# Patient Record
Sex: Male | Born: 1950 | Race: Black or African American | Hispanic: No | Marital: Married | Smoking: Former smoker
Health system: Southern US, Community
[De-identification: ages and names within clinical notes are randomized; demographics above are authoritative.]

## PROBLEM LIST (undated history)

## (undated) DIAGNOSIS — E119 Type 2 diabetes mellitus without complications: Secondary | ICD-10-CM

## (undated) DIAGNOSIS — Z9114 Patient's other noncompliance with medication regimen: Secondary | ICD-10-CM

## (undated) DIAGNOSIS — N2581 Secondary hyperparathyroidism of renal origin: Secondary | ICD-10-CM

## (undated) DIAGNOSIS — K219 Gastro-esophageal reflux disease without esophagitis: Secondary | ICD-10-CM

## (undated) DIAGNOSIS — N185 Chronic kidney disease, stage 5: Secondary | ICD-10-CM

## (undated) DIAGNOSIS — D649 Anemia, unspecified: Secondary | ICD-10-CM

## (undated) DIAGNOSIS — E785 Hyperlipidemia, unspecified: Secondary | ICD-10-CM

## (undated) DIAGNOSIS — E1122 Type 2 diabetes mellitus with diabetic chronic kidney disease: Secondary | ICD-10-CM

## (undated) DIAGNOSIS — N189 Chronic kidney disease, unspecified: Secondary | ICD-10-CM

## (undated) DIAGNOSIS — E1140 Type 2 diabetes mellitus with diabetic neuropathy, unspecified: Secondary | ICD-10-CM

## (undated) DIAGNOSIS — L97501 Non-pressure chronic ulcer of other part of unspecified foot limited to breakdown of skin: Secondary | ICD-10-CM

## (undated) DIAGNOSIS — E08621 Diabetes mellitus due to underlying condition with foot ulcer: Secondary | ICD-10-CM

## (undated) DIAGNOSIS — I10 Essential (primary) hypertension: Secondary | ICD-10-CM

## (undated) DIAGNOSIS — Z91148 Patient's other noncompliance with medication regimen for other reason: Secondary | ICD-10-CM

## (undated) DIAGNOSIS — M869 Osteomyelitis, unspecified: Secondary | ICD-10-CM

## (undated) HISTORY — PX: COLON SURGERY: SHX602

## (undated) HISTORY — PX: HERNIA REPAIR: SHX51

## (undated) HISTORY — PX: APPENDECTOMY: SHX54

## (undated) HISTORY — PX: CHOLECYSTECTOMY: SHX55

---

## 2010-09-03 ENCOUNTER — Emergency Department: Payer: Self-pay | Admitting: Emergency Medicine

## 2013-04-17 DIAGNOSIS — M869 Osteomyelitis, unspecified: Secondary | ICD-10-CM | POA: Insufficient documentation

## 2013-04-17 DIAGNOSIS — N184 Chronic kidney disease, stage 4 (severe): Secondary | ICD-10-CM | POA: Insufficient documentation

## 2013-04-17 DIAGNOSIS — E119 Type 2 diabetes mellitus without complications: Secondary | ICD-10-CM | POA: Insufficient documentation

## 2013-04-17 DIAGNOSIS — I10 Essential (primary) hypertension: Secondary | ICD-10-CM | POA: Insufficient documentation

## 2013-04-17 DIAGNOSIS — N189 Chronic kidney disease, unspecified: Secondary | ICD-10-CM | POA: Insufficient documentation

## 2014-10-02 DIAGNOSIS — I639 Cerebral infarction, unspecified: Secondary | ICD-10-CM

## 2014-10-02 HISTORY — DX: Cerebral infarction, unspecified: I63.9

## 2015-08-09 DIAGNOSIS — N183 Chronic kidney disease, stage 3 unspecified: Secondary | ICD-10-CM | POA: Insufficient documentation

## 2015-08-09 DIAGNOSIS — M6281 Muscle weakness (generalized): Secondary | ICD-10-CM | POA: Insufficient documentation

## 2015-08-09 DIAGNOSIS — E119 Type 2 diabetes mellitus without complications: Secondary | ICD-10-CM | POA: Insufficient documentation

## 2015-08-09 DIAGNOSIS — E781 Pure hyperglyceridemia: Secondary | ICD-10-CM | POA: Insufficient documentation

## 2015-08-09 DIAGNOSIS — I639 Cerebral infarction, unspecified: Secondary | ICD-10-CM | POA: Insufficient documentation

## 2015-08-09 DIAGNOSIS — Z720 Tobacco use: Secondary | ICD-10-CM | POA: Insufficient documentation

## 2015-08-09 DIAGNOSIS — I10 Essential (primary) hypertension: Secondary | ICD-10-CM | POA: Insufficient documentation

## 2015-08-09 DIAGNOSIS — N179 Acute kidney failure, unspecified: Secondary | ICD-10-CM | POA: Insufficient documentation

## 2015-08-18 DIAGNOSIS — E1142 Type 2 diabetes mellitus with diabetic polyneuropathy: Secondary | ICD-10-CM | POA: Insufficient documentation

## 2015-08-18 DIAGNOSIS — E782 Mixed hyperlipidemia: Secondary | ICD-10-CM | POA: Insufficient documentation

## 2015-08-18 DIAGNOSIS — Z89422 Acquired absence of other left toe(s): Secondary | ICD-10-CM | POA: Insufficient documentation

## 2016-07-18 ENCOUNTER — Ambulatory Visit (INDEPENDENT_AMBULATORY_CARE_PROVIDER_SITE_OTHER): Payer: BLUE CROSS/BLUE SHIELD | Admitting: Sports Medicine

## 2016-07-18 ENCOUNTER — Encounter: Payer: Self-pay | Admitting: Sports Medicine

## 2016-07-18 ENCOUNTER — Ambulatory Visit (INDEPENDENT_AMBULATORY_CARE_PROVIDER_SITE_OTHER): Payer: BLUE CROSS/BLUE SHIELD

## 2016-07-18 DIAGNOSIS — E1142 Type 2 diabetes mellitus with diabetic polyneuropathy: Secondary | ICD-10-CM

## 2016-07-18 DIAGNOSIS — R0989 Other specified symptoms and signs involving the circulatory and respiratory systems: Secondary | ICD-10-CM | POA: Diagnosis not present

## 2016-07-18 DIAGNOSIS — L97522 Non-pressure chronic ulcer of other part of left foot with fat layer exposed: Secondary | ICD-10-CM | POA: Diagnosis not present

## 2016-07-18 DIAGNOSIS — B351 Tinea unguium: Secondary | ICD-10-CM | POA: Diagnosis not present

## 2016-07-18 DIAGNOSIS — Z89422 Acquired absence of other left toe(s): Secondary | ICD-10-CM

## 2016-07-18 DIAGNOSIS — M79676 Pain in unspecified toe(s): Secondary | ICD-10-CM

## 2016-07-18 DIAGNOSIS — S98132A Complete traumatic amputation of one left lesser toe, initial encounter: Secondary | ICD-10-CM

## 2016-07-18 DIAGNOSIS — M79672 Pain in left foot: Secondary | ICD-10-CM

## 2016-07-18 MED ORDER — TRAMADOL HCL 50 MG PO TABS: 50.0000 mg | ORAL_TABLET | Freq: Four times a day (QID) | ORAL | Status: DC | PRN

## 2016-07-18 NOTE — Progress Notes (Signed)
Subjective: [name] is a 65 y.o. male patient seen in office for evaluation of ulceration of the Left foot. Patient has a history of diabetes and a blood glucose level  today of 164m/dl.   Patient is changing the dressing using neopsorin at home. Denies nausea/fever/vomiting/chills/night sweats/shortness of breath/pain. [place] clear fluid where he was started on antibiotics; Admits a 3 month history of this ulceration. Patient has no other pedal complaints at this time.  Patient Active Problem List   Diagnosis Date Noted  . History of amputation of lesser toe of left foot ([place] 08/18/2015  . Hyperlipidemia, mixed 08/18/2015  . Type 2 diabetes mellitus with peripheral neuropathy (HPle[place] 08/18/2015  . Acute CVA (cerebrovascular accident) ([place] 08/09/2015  . Acute renal failure superimposed on stage 3 chronic kidney disease (HS[place] 08/09/2015  . Essential hypertriglyceridemia 08/09/2015  . Hypertension, essential 08/09/2015  . Right-sided muscle weakness 08/09/2015  . T2DM (type 2 diabetes mellitus) ([place] 08/09/2015  . Tobacco abuse 08/09/2015  . CKD (chronic kidney disease) 04/17/2013  . DM2 (diabetes mellitus, type 2) (HM[place]na 04/17/2013  . HTN (hypertension) 04/17/2013  . Osteomyelitis of toe of left foot (HC[place] 04/17/2013   No current outpatient prescriptions on file prior to visit.   No current facility-administered medications on file prior to visit.    No Known Allergies  No results found for this or any previous visit (from the past 2160 hour(s)).  Objective: There were no vitals filed for this visit.  General: Patient is awake, alert, oriented x 3 and in no acute distress.  Dermatology: Skin is warm and dry bilateral with a full thickness ulceration present Left sub met 1 to capsule Ulceration measures 3cm x 3 cm x 0.6 cm and tunnels 7 o'clock posiiton 1 cm. There is a keratotic border with a bleeding base.  The ulceration does probe to close to bone/capsule. There is mild malodor, no active drainage, no erythema, no edema. No other acute signs of infection. Nails 1-5 on right and 3-5 on left mycotic and elongated.   Vascular: Dorsalis Pedis pulse = 1/4 Bilateral,  Posterior Tibial pulse = 0/4 Bilateral,  Capillary Fill Time < 5 seconds  Neurologic: Vibratory and Protective sensation absent bilateral L>R.  Musculosketal: Mild pain with palpation to ulcerated area. No pain with compression to calves bilateral. Amputated left 1st and 2nd toe.   Xrays, Left foot:Amputated 1st and 2nd toe. No bony destruction suggestive of osteomyelitis. No gas in soft tissues.   Assessment and Plan:  Problem List Items Addressed This Visit    None    Visit Diagnoses    Left foot pain    -  Primary   Relevant Medications   traMADol (ULTRAM) 50 MG tablet   Other Relevant Orders   DG Foot 2 Views Left   WOUND CULTURE   Ulcer of left foot, with fat layer exposed (HT[place]       Relevant Orders   WOUND CULTURE   Amputated toe of left foot (HCC)       1st and 2nd toes   Diabetic polyneuropathy associated with type 2 diabetes mellitus (HCC)       Relevant Medications   aspirin EC 81 MG tablet   aspirin 81 MG chewable tablet   atorvastatin (LIPITOR) 80 MG tablet   glipiZIDE (GLUCOTROL) 5 MG tablet   Insulin Glargine (LANTUS SOLOSTAR) 100 UNIT/ML Solostar Pen   losartan (COZAAR) 50 MG tablet   pregabalin (  LYRICA) 100 MG capsule   metFORMIN (GLUCOPHAGE) 500 MG tablet   liraglutide (VICTOZA) 18 MG/3ML SOPN   Decreased pedal pulses       Dermatophytosis of nail          -Examined patient and discussed the progression of the wound and treatment alternatives. -Xrays reviewed - Excisionally dedbrided ulceration to healthy bleeding borders using a sterile chisel  blade. -Applied lumican for the bleeding and iodosorb and dry sterile dressing and instructed patient to continue with daily dressings at home consisting  of betadine and dry sterile dressing. Rx wound care supplies from [facility]  -Dispensed post op shoe to assist with offloading ulceration area -Continue with oral antibiotics will follow up wound culture obtained today and call patient if change in antitbiotics is needed -Rx Vascular studies  -Patient is a high risk since he already has lost 2 toes on left from amputation secondary to bone infection >1 year ago - Advised patient to go to the ER or return to office if the wound worsens or if constitutional symptoms are present. -Debrided nails x 8 using sterile nail nipper without incident -Patient to return to office in 2 weeks for follow up care and evaluation or sooner if problems arise.Will plan to order MRI at next visit if no improvement r/o osteomyelitis since ulceration probes to joint capsule.   [name], DPM

## 2016-07-19 ENCOUNTER — Telehealth: Payer: Self-pay | Admitting: Sports Medicine

## 2016-07-19 ENCOUNTER — Telehealth: Payer: Self-pay | Admitting: *Deleted

## 2016-07-19 DIAGNOSIS — L97522 Non-pressure chronic ulcer of other part of left foot with fat layer exposed: Secondary | ICD-10-CM

## 2016-07-19 NOTE — Telephone Encounter (Addendum)
-----   Message from [place], [place] sent at 07/18/2016  4:07 PM EDT ----- Regarding: [facility] and Vascular studies  Order wound care supplies 4x4, kerlix, coban and betadine to apply to left foot ulcer  Measures 3x3cm to joint capsule  Vascular abi/pvr left foot chronic ulceration   Thanks Dr. Cannon Kettle. 07/19/2016-Faxed [facility] order form, with demographics, and clinicals. Faxed orders to [facility]. 07/20/2016-[facility] faxed notice they were unable to contact pt with current demographics.  I called pt's current contact number and it was a business that states pt is not employed there. 08/02/2016-Male caller stating to be pt's wife, asked for the name of the antibiotic pt was on. I spoke with the male caller and she states she has the medication name it is Levaquin.

## 2016-07-19 NOTE — Telephone Encounter (Signed)
Tried to contact patient to check on him since he had a lot of bleeding after his wound debridement when he was in the office on yesterday and to also let him know that his xray was negative for any acute bone infection. The phone # in chart was a place of employment and the person who answered said that patient did not work there. -Dr. Cannon Kettle

## 2016-07-21 NOTE — Telephone Encounter (Addendum)
-----   Message from Baxter Flattery sent at 07/19/2016  2:22 PM EDT ----- Regarding:  LOWER ARTERIAL DUPLEX  Hi Valery , I call this pt to schedule [facility]  Appt. The phone #  [phone]  They say they don't have anyone by that name there no other # is on file, wondering if you have another contact #. I called pt's [employer] and asked Tamika to have pt call me if the contact number she had for pt was different from ours, it was and she will have pt call us.

## 2016-08-01 ENCOUNTER — Ambulatory Visit (INDEPENDENT_AMBULATORY_CARE_PROVIDER_SITE_OTHER): Payer: BLUE CROSS/BLUE SHIELD | Admitting: Sports Medicine

## 2016-08-01 ENCOUNTER — Encounter: Payer: Self-pay | Admitting: Sports Medicine

## 2016-08-01 DIAGNOSIS — Z89422 Acquired absence of other left toe(s): Secondary | ICD-10-CM | POA: Diagnosis not present

## 2016-08-01 DIAGNOSIS — L97521 Non-pressure chronic ulcer of other part of left foot limited to breakdown of skin: Secondary | ICD-10-CM | POA: Diagnosis not present

## 2016-08-01 DIAGNOSIS — E1142 Type 2 diabetes mellitus with diabetic polyneuropathy: Secondary | ICD-10-CM

## 2016-08-01 DIAGNOSIS — R0989 Other specified symptoms and signs involving the circulatory and respiratory systems: Secondary | ICD-10-CM

## 2016-08-01 DIAGNOSIS — S98132A Complete traumatic amputation of one left lesser toe, initial encounter: Secondary | ICD-10-CM

## 2016-08-01 DIAGNOSIS — E11621 Type 2 diabetes mellitus with foot ulcer: Secondary | ICD-10-CM | POA: Diagnosis not present

## 2016-08-01 DIAGNOSIS — M79672 Pain in left foot: Secondary | ICD-10-CM

## 2016-08-01 MED ORDER — TRAMADOL HCL 50 MG PO TABS: 50.0000 mg | ORAL_TABLET | Freq: Four times a day (QID) | ORAL | Status: DC | PRN

## 2016-08-01 MED ORDER — LEVOFLOXACIN 500 MG PO TABS: 500.0000 mg | ORAL_TABLET | Freq: Every day | ORAL | 0 refills | Status: DC

## 2016-08-01 NOTE — Progress Notes (Signed)
Subjective: [name] is a 65 y.o. male patient seen in office for follow up evaluation of ulceration of the Left foot. Patient has a history of diabetes and a blood glucose level today of 151m/dl.   Patient is changing the dressing using betadine at home. Denies nausea/fever/vomiting/chills/night sweats/shortness of breath/pain. Patient has no other pedal complaints at this time.  Patient Active Problem List   Diagnosis Date Noted  . History of amputation of lesser toe of left foot (HC[place] 08/18/2015  . Hyperlipidemia, mixed 08/18/2015  . Type 2 diabetes mellitus with peripheral neuropathy (HOccidental 08/18/2015  . Acute CVA (cerebrovascular accident) (HE[place] 08/09/2015  . Acute renal failure superimposed on stage 3 chronic kidney disease (HRiver Heights 08/09/2015  . Essential hypertriglyceridemia 08/09/2015  . Hypertension, essential 08/09/2015  . Right-sided muscle weakness 08/09/2015  . T2DM (type 2 diabetes mellitus) (HVarn[place] 08/09/2015  . Tobacco abuse 08/09/2015  . CKD (chronic kidney disease) 04/17/2013  . DM2 (diabetes mellitus, type 2) (HSalem 04/17/2013  . HTN (hypertension) 04/17/2013  . Osteomyelitis of toe of left foot (HSouth Phil[place] 04/17/2013   Current Outpatient Prescriptions on File Prior to Visit  Medication Sig Dispense Refill  . amLODipine (NORVASC) 10 MG tablet Take 5 mg by mouth.    .Marland KitchenamLODipine (NORVASC) 5 MG tablet Take by mouth.    .Marland Kitchenaspirin 81 MG chewable tablet Chew by mouth.    .Marland Kitchenaspirin EC 81 MG tablet Take 81 mg by mouth.    .Marland Kitchenatorvastatin (LIPITOR) 80 MG tablet Take 80 mg by mouth.    . Cholecalciferol (VITAMIN D3) 1000 units CAPS Take by mouth.    . furosemide (LASIX) 20 MG tablet Take 20 mg by mouth.    .Marland KitchenglipiZIDE (GLUCOTROL) 5 MG tablet     . Insulin Glargine (LANTUS SOLOSTAR) 100 UNIT/ML Solostar Pen Use up to 50 units per day as per MD instructions    . Lancets (ONETOUCH ULTRASOFT) lancets by Does not apply route.    . liraglutide (VICTOZA) 18 MG/3ML SOPN Inject into the  skin.    .Marland Kitchenlosartan (COZAAR) 50 MG tablet Take 1 tablet daily    . metFORMIN (GLUCOPHAGE) 500 MG tablet Take by mouth.    . pregabalin (LYRICA) 100 MG capsule Take by mouth.     No current facility-administered medications on file prior to visit.    No Known Allergies  No results found for this or any previous visit (from the past 2160 hour(s)).  Objective: There were no vitals filed for this visit.  General: Patient is awake, alert, oriented x 3 and in no acute distress.  Dermatology: Skin is warm and dry bilateral with a now partial thickness ulceration present Left sub met 1 to measures 1.5cm x 1.5 cm x 0.3 cm and tunnels resolved and size much smaller than last visit. There is a keratotic border with a bleeding base. The ulceration no longer probes close to bone. There is no malodor, no active drainage, no erythema, no edema. No other acute signs of infection. Nails 1-5 on right and 3-5 on left are mycotic but short.    Vascular: Dorsalis Pedis pulse = 1/4 Bilateral,  Posterior Tibial pulse = 0/4 Bilateral,  Capillary Fill Time < 5 seconds  Neurologic: Vibratory and Protective sensation absent bilateral L>R.  Musculosketal: Mild pain with palpation to ulcerated area. No pain with compression to calves bilateral. Amputated left 1st and 2nd toe.   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 - Excisionally dedbrided ulceration  to healthy bleeding borders using a sterile chisel  blade. -Applied iodosorb and dry sterile dressing and instructed patient to continue with daily dressings at home consisting of betadine and dry sterile dressing. Rx wound care supplies from [facility]; to follow up order -Continue with post op shoe to assist with offloading ulceration area -Rx Levaquin to cover Providencia Rottegeri bacteria that grew out on wound culture -Awaiting Vascular studies  -Patient is a high risk since he already has lost 2 toes on left from amputation secondary to bone infection >1 year ago - Advised patient to go to the ER or return to office if the wound worsens or if constitutional symptoms are present. -Patient to return to office in 2 weeks for follow up care and evaluation or sooner if problems arise.Will plan to order MRI at next visit if no continued improvement.   [name], DPM

## 2016-08-02 ENCOUNTER — Telehealth: Payer: Self-pay | Admitting: *Deleted

## 2016-08-02 ENCOUNTER — Other Ambulatory Visit: Payer: Self-pay | Admitting: Sports Medicine

## 2016-08-02 DIAGNOSIS — L97502 Non-pressure chronic ulcer of other part of unspecified foot with fat layer exposed: Secondary | ICD-10-CM

## 2016-08-02 NOTE — Telephone Encounter (Addendum)
Pt's phone rang over 1 minute without answering service pick up.08/02/2016- I spoke to pt on [phone], and informed him I would contact [facility] and the vascular studies facility and order the circulation testing. I explained the lag time was due to our office not having correct contact information for him. I spoke with Leann - [facility] , she states refax the orders and give her the new contact phone. I gave Marylin Crosby, pt's [phone], and faxed 07/19/2016 orders and insurance card. Faxed required [facility] form and pt demographics for [facility] using the measurements of 08/01/2016, because [facility] was unable to contact pt due to encorrect contact information. 08/14/2016-Pt called asked if he should keep his appt with Dr. Cannon Kettle tomorrow at 1:45pm because the doctor's appt he was suppose to see prior to Dr. Leeanne Rio appt is scheduled 08/21/2016. I informed pt he should keep the appt 08/14/2016 with Dr. Cannon Kettle tomorrow she would want to evaluate his ulcer. Pt states understanding.

## 2016-08-15 ENCOUNTER — Encounter: Payer: Self-pay | Admitting: Sports Medicine

## 2016-08-15 ENCOUNTER — Ambulatory Visit: Payer: BLUE CROSS/BLUE SHIELD | Admitting: Sports Medicine

## 2016-08-15 ENCOUNTER — Ambulatory Visit (INDEPENDENT_AMBULATORY_CARE_PROVIDER_SITE_OTHER): Payer: BLUE CROSS/BLUE SHIELD | Admitting: Sports Medicine

## 2016-08-15 DIAGNOSIS — S98132A Complete traumatic amputation of one left lesser toe, initial encounter: Secondary | ICD-10-CM

## 2016-08-15 DIAGNOSIS — R0989 Other specified symptoms and signs involving the circulatory and respiratory systems: Secondary | ICD-10-CM

## 2016-08-15 DIAGNOSIS — M79672 Pain in left foot: Secondary | ICD-10-CM

## 2016-08-15 DIAGNOSIS — L97521 Non-pressure chronic ulcer of other part of left foot limited to breakdown of skin: Secondary | ICD-10-CM | POA: Diagnosis not present

## 2016-08-15 DIAGNOSIS — E1142 Type 2 diabetes mellitus with diabetic polyneuropathy: Secondary | ICD-10-CM

## 2016-08-15 DIAGNOSIS — Z89422 Acquired absence of other left toe(s): Secondary | ICD-10-CM

## 2016-08-15 DIAGNOSIS — E11621 Type 2 diabetes mellitus with foot ulcer: Secondary | ICD-10-CM

## 2016-08-15 NOTE — Progress Notes (Signed)
Subjective: [name] is a 65 y.o. male patient seen in office for follow up evaluation of ulceration of the Left foot. Patient has a history of diabetes and a blood glucose level today "good".   Patient is changing the dressing using neosporin at home. States he had a hard time finding betadine. Denies nausea/fever/vomiting/chills/night sweats/shortness of breath/pain. Patient has no other pedal complaints at this time.  Patient Active Problem List   Diagnosis Date Noted  . History of amputation of lesser toe of left foot ([place]) 08/18/2015  . Hyperlipidemia, mixed 08/18/2015  . Type 2 diabetes mellitus with peripheral neuropathy ([place]) 08/18/2015  . Acute CVA (cerebrovascular accident) ([place]) 08/09/2015  . Acute renal failure superimposed on stage 3 chronic kidney disease ([place]) 08/09/2015  . Essential hypertriglyceridemia 08/09/2015  . Hypertension, essential 08/09/2015  . Right-sided muscle weakness 08/09/2015  . T2DM (type 2 diabetes mellitus) ([place]) 08/09/2015  . Tobacco abuse 08/09/2015  . CKD (chronic kidney disease) 04/17/2013  . DM2 (diabetes mellitus, type 2) ([place]) 04/17/2013  . HTN (hypertension) 04/17/2013  . Osteomyelitis of toe of left foot ([place]) 04/17/2013   Current Outpatient Prescriptions on File Prior to Visit  Medication Sig Dispense Refill  . amLODipine (NORVASC) 10 MG tablet Take 5 mg by mouth.    Marland Kitchen amLODipine (NORVASC) 5 MG tablet Take by mouth.    Marland Kitchen aspirin 81 MG chewable tablet Chew by mouth.    Marland Kitchen aspirin EC 81 MG tablet Take 81 mg by mouth.    Marland Kitchen atorvastatin (LIPITOR) 80 MG tablet Take 80 mg by mouth.    . Cholecalciferol (VITAMIN D3) 1000 units CAPS Take by mouth.    . furosemide (LASIX) 20 MG tablet Take 20 mg by mouth.    Marland Kitchen glipiZIDE (GLUCOTROL) 5 MG tablet     . Insulin Glargine (LANTUS SOLOSTAR) 100 UNIT/ML Solostar Pen Use up to 50 units per day as per MD instructions    . Lancets (ONETOUCH ULTRASOFT) lancets by Does not apply route.    Marland Kitchen levofloxacin  (LEVAQUIN) 500 MG tablet Take 1 tablet (500 mg total) by mouth daily. 14 tablet 0  . liraglutide (VICTOZA) 18 MG/3ML SOPN Inject into the skin.    Marland Kitchen losartan (COZAAR) 50 MG tablet Take 1 tablet daily    . metFORMIN (GLUCOPHAGE) 500 MG tablet Take by mouth.    . pregabalin (LYRICA) 100 MG capsule Take by mouth.    . traMADol (ULTRAM) 50 MG tablet Take 1 tablet (50 mg total) by mouth every 6 (six) hours as needed. 30 tablet o   No current facility-administered medications on file prior to visit.    No Known Allergies  No results found for this or any previous visit (from the past 2160 hour(s)).  Objective: There were no vitals filed for this visit.  General: Patient is awake, alert, oriented x 3 and in no acute distress.  Dermatology: Skin is warm and dry bilateral with a now partial thickness ulceration present Left sub met 1 to measures 1cm x 2.2 cm x 0.3 cm (smaller than last visit). There is a keratotic border with a bleeding base. The ulceration no longer probes. No tunneling. No undermining. There is no malodor, no active drainage, no erythema, no edema. No other acute signs of infection. Nails 1-5 on right and 3-5 on left are mycotic but short.    Vascular: Dorsalis Pedis pulse = 1/4 Bilateral,  Posterior Tibial pulse = 0/4 Bilateral,  Capillary Fill Time < 5 seconds  Neurologic: Vibratory  and Protective sensation absent bilateral L>R.  Musculosketal: Mild pain with palpation to ulcerated area. No pain with compression to calves bilateral. Amputated left 1st and 2nd toe.   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 - Excisionally dedbrided ulceration to healthy bleeding borders using a sterile chisel  blade. -Applied iodosorb and dry sterile dressing and instructed patient to continue with daily dressings at home consisting of betadine and dry sterile dressing.  -Continue with post op shoe to assist with offloading ulceration area -Continue with Levaquin to cover Providencia Rottegeri bacteria that grew out on wound culture until completed -Awaiting Vascular studies to go next week as scheduled  -Patient is a high risk since he already has lost 2 toes on left from amputation secondary to bone infection >1 year ago - Advised patient to go to the ER or return to office if the wound worsens or if constitutional symptoms are present. -Patient to return to office in 2 weeks for follow up care and evaluation or sooner if problems arise.Will plan to order MRI at next visit if no continued improvement.   [name], DPM

## 2016-08-21 ENCOUNTER — Ambulatory Visit: Payer: BLUE CROSS/BLUE SHIELD

## 2016-08-21 DIAGNOSIS — R202 Paresthesia of skin: Secondary | ICD-10-CM | POA: Diagnosis not present

## 2016-08-21 DIAGNOSIS — L97502 Non-pressure chronic ulcer of other part of unspecified foot with fat layer exposed: Secondary | ICD-10-CM

## 2016-08-21 DIAGNOSIS — L97522 Non-pressure chronic ulcer of other part of left foot with fat layer exposed: Secondary | ICD-10-CM | POA: Diagnosis not present

## 2016-08-29 ENCOUNTER — Encounter: Payer: Self-pay | Admitting: Sports Medicine

## 2016-08-29 ENCOUNTER — Ambulatory Visit (INDEPENDENT_AMBULATORY_CARE_PROVIDER_SITE_OTHER): Payer: BLUE CROSS/BLUE SHIELD | Admitting: Sports Medicine

## 2016-08-29 DIAGNOSIS — M79672 Pain in left foot: Secondary | ICD-10-CM | POA: Diagnosis not present

## 2016-08-29 DIAGNOSIS — E11621 Type 2 diabetes mellitus with foot ulcer: Secondary | ICD-10-CM

## 2016-08-29 DIAGNOSIS — Z89422 Acquired absence of other left toe(s): Secondary | ICD-10-CM | POA: Diagnosis not present

## 2016-08-29 DIAGNOSIS — S98132A Complete traumatic amputation of one left lesser toe, initial encounter: Secondary | ICD-10-CM

## 2016-08-29 DIAGNOSIS — L97521 Non-pressure chronic ulcer of other part of left foot limited to breakdown of skin: Secondary | ICD-10-CM | POA: Diagnosis not present

## 2016-08-29 DIAGNOSIS — E1142 Type 2 diabetes mellitus with diabetic polyneuropathy: Secondary | ICD-10-CM

## 2016-08-29 NOTE — Progress Notes (Signed)
Subjective: [name] is a 65 y.o. male patient seen in office for follow up evaluation of ulceration of the Left foot. Patient has a history of diabetes and a blood glucose level today "good".   Patient is changing the dressing using antibiotic cream. Denies nausea/fever/vomiting/chills/night sweats/shortness of breath/pain. Patient has no other pedal complaints at this time.  Patient Active Problem List   Diagnosis Date Noted  . History of amputation of lesser toe of left foot ([place]) 08/18/2015  . Hyperlipidemia, mixed 08/18/2015  . Type 2 diabetes mellitus with peripheral neuropathy ([place]) 08/18/2015  . Acute CVA (cerebrovascular accident) ([place]) 08/09/2015  . Acute renal failure superimposed on stage 3 chronic kidney disease ([place]) 08/09/2015  . Essential hypertriglyceridemia 08/09/2015  . Hypertension, essential 08/09/2015  . Right-sided muscle weakness 08/09/2015  . T2DM (type 2 diabetes mellitus) ([place]) 08/09/2015  . Tobacco abuse 08/09/2015  . CKD (chronic kidney disease) 04/17/2013  . DM2 (diabetes mellitus, type 2) ([place]) 04/17/2013  . HTN (hypertension) 04/17/2013  . Osteomyelitis of toe of left foot ([place]) 04/17/2013   Current Outpatient Prescriptions on File Prior to Visit  Medication Sig Dispense Refill  . amLODipine (NORVASC) 10 MG tablet Take 5 mg by mouth.    Marland Kitchen amLODipine (NORVASC) 5 MG tablet Take by mouth.    Marland Kitchen aspirin 81 MG chewable tablet Chew by mouth.    Marland Kitchen aspirin EC 81 MG tablet Take 81 mg by mouth.    Marland Kitchen atorvastatin (LIPITOR) 80 MG tablet Take 80 mg by mouth.    . Cholecalciferol (VITAMIN D3) 1000 units CAPS Take by mouth.    . furosemide (LASIX) 20 MG tablet Take 20 mg by mouth.    Marland Kitchen glipiZIDE (GLUCOTROL) 5 MG tablet     . Insulin Glargine (LANTUS SOLOSTAR) 100 UNIT/ML Solostar Pen Use up to 50 units per day as per MD instructions    . Lancets (ONETOUCH ULTRASOFT) lancets by Does not apply route.    Marland Kitchen levofloxacin (LEVAQUIN) 500 MG tablet Take 1 tablet  (500 mg total) by mouth daily. 14 tablet 0  . liraglutide (VICTOZA) 18 MG/3ML SOPN Inject into the skin.    Marland Kitchen losartan (COZAAR) 50 MG tablet Take 1 tablet daily    . metFORMIN (GLUCOPHAGE) 500 MG tablet Take by mouth.    . pregabalin (LYRICA) 100 MG capsule Take by mouth.    . traMADol (ULTRAM) 50 MG tablet Take 1 tablet (50 mg total) by mouth every 6 (six) hours as needed. 30 tablet o   No current facility-administered medications on file prior to visit.    No Known Allergies  No results found for this or any previous visit (from the past 2160 hour(s)).  Objective: There were no vitals filed for this visit.  General: Patient is awake, alert, oriented x 3 and in no acute distress.  Dermatology: Skin is warm and dry bilateral with a now partial thickness ulceration present Left sub met 1 to measures 1cm x 2.0 cm x 0.3 cm (smaller than last visit). There is a keratotic border with a bleeding base. The ulceration no longer probes. No tunneling. No undermining. There is no malodor, no active drainage, no erythema, no edema. No other acute signs of infection. Nails 1-5 on right and 3-5 on left are mycotic but short.    Vascular: Dorsalis Pedis pulse = 1/4 Bilateral,  Posterior Tibial pulse = 0/4 Bilateral,  Capillary Fill Time < 5 seconds  Neurologic: Vibratory and Protective sensation absent bilateral L>R.  Musculosketal: Mild pain with palpation to ulcerated area. No pain with compression to calves bilateral. Amputated left 1st and 2nd toe.   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 - Excisionally dedbrided ulceration to healthy bleeding borders using a sterile chisel  blade. -Applied oasis trilayer 3x7cm wound matrix lot [id number] to left foot ulceration secured with steristrips with no waste. Wound matrix was covered with 4x4, kerlix, and coban. Patient to change outer dressings only -Continue with post op shoe to assist with offloading ulceration area -ABI/PVRs normal - Advised patient to go to the ER or return to office if the wound worsens or if constitutional symptoms are present. -Patient to return to office in 2 weeks for follow up care/reapplication of oasis and evaluation or sooner if problems arise.   [name], DPM

## 2016-09-11 DIAGNOSIS — R52 Pain, unspecified: Secondary | ICD-10-CM

## 2016-09-12 ENCOUNTER — Encounter: Payer: Self-pay | Admitting: Sports Medicine

## 2016-09-12 ENCOUNTER — Ambulatory Visit (INDEPENDENT_AMBULATORY_CARE_PROVIDER_SITE_OTHER): Payer: BLUE CROSS/BLUE SHIELD | Admitting: Sports Medicine

## 2016-09-12 DIAGNOSIS — E1142 Type 2 diabetes mellitus with diabetic polyneuropathy: Secondary | ICD-10-CM | POA: Diagnosis not present

## 2016-09-12 DIAGNOSIS — L97521 Non-pressure chronic ulcer of other part of left foot limited to breakdown of skin: Secondary | ICD-10-CM | POA: Diagnosis not present

## 2016-09-12 DIAGNOSIS — Z89422 Acquired absence of other left toe(s): Secondary | ICD-10-CM | POA: Diagnosis not present

## 2016-09-12 DIAGNOSIS — M79672 Pain in left foot: Secondary | ICD-10-CM | POA: Diagnosis not present

## 2016-09-12 DIAGNOSIS — E11621 Type 2 diabetes mellitus with foot ulcer: Secondary | ICD-10-CM

## 2016-09-12 DIAGNOSIS — S98132A Complete traumatic amputation of one left lesser toe, initial encounter: Secondary | ICD-10-CM

## 2016-09-12 NOTE — Progress Notes (Signed)
Subjective: [name] is a 65 y.o. male patient seen in office for follow up evaluation of ulceration of the Left foot. Patient has a history of diabetes and a blood glucose level today "same:, 147m/dl.  Patient is changing the outer dressings. Denies nausea/fever/vomiting/chills/night sweats/shortness of breath/pain. Patient has no other pedal complaints at this time.  Patient Active Problem List   Diagnosis Date Noted  . History of amputation of lesser toe of left foot ([facility] [place] 08/18/2015  . Hyperlipidemia, mixed 08/18/2015  . Type 2 diabetes mellitus with peripheral neuropathy ([facility] 08/18/2015  . Acute CVA (cerebrovascular accident) ([facility] 08/09/2015  . Acute renal failure superimposed on stage 3 chronic kidney disease ([facility] 08/09/2015  . Essential hypertriglyceridemia 08/09/2015  . Hypertension, essential 08/09/2015  . Right-sided muscle weakness 08/09/2015  . T2DM (type 2 diabetes mellitus) ([facility] 08/09/2015  . Tobacco abuse 08/09/2015  . CKD (chronic kidney disease) 04/17/2013  . DM2 (diabetes mellitus, type 2) ([facility] 04/17/2013  . HTN (hypertension) 04/17/2013  . Osteomyelitis of toe of left foot ([facility] 04/17/2013   Current Outpatient Prescriptions on File Prior to Visit  Medication Sig Dispense Refill  . amLODipine (NORVASC) 10 MG tablet Take 5 mg by mouth.    .Marland KitchenamLODipine (NORVASC) 5 MG tablet Take by mouth.    .Marland Kitchenaspirin 81 MG chewable tablet Chew by mouth.    .Marland Kitchenaspirin EC 81 MG tablet Take 81 mg by mouth.    .Marland Kitchenatorvastatin (LIPITOR) 80 MG tablet Take 80 mg by mouth.    . Cholecalciferol (VITAMIN D3) 1000 units CAPS Take by mouth.    . furosemide (LASIX) 20 MG tablet Take 20 mg by mouth.    .Marland KitchenglipiZIDE (GLUCOTROL) 5 MG tablet     . Insulin Glargine (LANTUS SOLOSTAR) 100 UNIT/ML Solostar Pen Use up to 50 units per day as per MD instructions    . Lancets (ONETOUCH ULTRASOFT) lancets by Does not apply route.    .Marland Kitchenlevofloxacin (LEVAQUIN) 500 MG tablet Take 1 tablet (500 mg  total) by mouth daily. 14 tablet 0  . liraglutide (VICTOZA) 18 MG/3ML SOPN Inject into the skin.    .Marland Kitchenlosartan (COZAAR) 50 MG tablet Take 1 tablet daily    . metFORMIN (GLUCOPHAGE) 500 MG tablet Take by mouth.    . pregabalin (LYRICA) 100 MG capsule Take by mouth.    . traMADol (ULTRAM) 50 MG tablet Take 1 tablet (50 mg total) by mouth every 6 (six) hours as needed. 30 tablet o   No current facility-administered medications on file prior to visit.    No Known Allergies  No results found for this or any previous visit (from the past 2160 hour(s)).  Objective: There were no vitals filed for this visit.  General: Patient is awake, alert, oriented x 3 and in no acute distress.  Dermatology: Skin is warm and dry bilateral with a partial thickness ulceration present Left sub met 1 to measures 0.6cm x 5.0 cm x 0.2 cm (last visit 1cm x 2.0 cm x 0.3 cm). There is a keratotic border with a bleeding base. The ulceration no longer probes. No tunneling. No undermining. There is no malodor, no active drainage, no erythema, no edema. No other acute signs of infection. Nails 1-5 on right and 3-5 on left are mycotic but short.    Vascular: Dorsalis Pedis pulse = 1/4 Bilateral,  Posterior Tibial pulse = 0/4 Bilateral,  Capillary Fill Time < 5 seconds  Neurologic: Vibratory and Protective sensation  absent bilateral L>R.  Musculosketal: Mild pain with palpation to ulcerated area. No pain with compression to calves bilateral. Amputated left 1st and 2nd toe.   Assessment and Plan:  Problem List Items Addressed This Visit    None    Visit Diagnoses    Diabetic ulcer of other part of left foot associated with type 2 diabetes mellitus, limited to breakdown of skin ([place])    -  Primary   Left foot pain       Amputated toe of left foot ([place])       Diabetic polyneuropathy associated with type 2 diabetes mellitus ([place])         -Examined patient and discussed the progression of the wound and treatment  alternatives. - Excisionally dedbrided ulceration to healthy bleeding borders using a sterile chisel  blade. -Applied oasis trilayer 3x7cm wound matrix lot [id number] to left foot ulceration secured with steristrips. 50% of wound matrix used. Wound matrix was covered with 4x4, kerlix, and coban. Patient to change outer dressings only -Continue with post op shoe to assist with offloading ulceration area - Advised patient to go to the ER or return to office if the wound worsens or if constitutional symptoms are present. -Patient to return to office in 2-3 weeks for follow up care/reapplication of oasis and evaluation or sooner if problems arise.   [name], DPM

## 2016-10-02 HISTORY — PX: AMPUTATION OF REPLICATED TOES: SHX1136

## 2016-10-03 ENCOUNTER — Encounter: Payer: Self-pay | Admitting: Sports Medicine

## 2016-10-03 ENCOUNTER — Ambulatory Visit (INDEPENDENT_AMBULATORY_CARE_PROVIDER_SITE_OTHER): Payer: BLUE CROSS/BLUE SHIELD | Admitting: Sports Medicine

## 2016-10-03 DIAGNOSIS — L97521 Non-pressure chronic ulcer of other part of left foot limited to breakdown of skin: Secondary | ICD-10-CM | POA: Diagnosis not present

## 2016-10-03 DIAGNOSIS — E1142 Type 2 diabetes mellitus with diabetic polyneuropathy: Secondary | ICD-10-CM

## 2016-10-03 DIAGNOSIS — M79672 Pain in left foot: Secondary | ICD-10-CM | POA: Diagnosis not present

## 2016-10-03 DIAGNOSIS — E11621 Type 2 diabetes mellitus with foot ulcer: Secondary | ICD-10-CM

## 2016-10-03 DIAGNOSIS — Z89422 Acquired absence of other left toe(s): Secondary | ICD-10-CM | POA: Diagnosis not present

## 2016-10-03 DIAGNOSIS — S98132A Complete traumatic amputation of one left lesser toe, initial encounter: Secondary | ICD-10-CM

## 2016-10-03 NOTE — Progress Notes (Signed)
Subjective: [name] is a 66 y.o. male patient seen in office for follow up evaluation of ulceration of the Left foot. Patient has a history of diabetes and a blood glucose level today "same".  Patient is changing the outer dressings. Denies nausea/fever/vomiting/chills/night sweats/shortness of breath/pain. Patient has no other pedal complaints at this time.  Patient Active Problem List   Diagnosis Date Noted  . History of amputation of lesser toe of left foot ([place]) 08/18/2015  . Hyperlipidemia, mixed 08/18/2015  . Type 2 diabetes mellitus with peripheral neuropathy ([place]) 08/18/2015  . Acute CVA (cerebrovascular accident) ([place]) 08/09/2015  . Acute renal failure superimposed on stage 3 chronic kidney disease ([place]) 08/09/2015  . Essential hypertriglyceridemia 08/09/2015  . Hypertension, essential 08/09/2015  . Right-sided muscle weakness 08/09/2015  . T2DM (type 2 diabetes mellitus) ([place]) 08/09/2015  . Tobacco abuse 08/09/2015  . CKD (chronic kidney disease) 04/17/2013  . DM2 (diabetes mellitus, type 2) ([place]) 04/17/2013  . HTN (hypertension) 04/17/2013  . Osteomyelitis of toe of left foot ([place]) 04/17/2013   Current Outpatient Prescriptions on File Prior to Visit  Medication Sig Dispense Refill  . amLODipine (NORVASC) 10 MG tablet Take 5 mg by mouth.    Marland Kitchen amLODipine (NORVASC) 5 MG tablet Take by mouth.    Marland Kitchen aspirin 81 MG chewable tablet Chew by mouth.    Marland Kitchen aspirin EC 81 MG tablet Take 81 mg by mouth.    Marland Kitchen atorvastatin (LIPITOR) 80 MG tablet Take 80 mg by mouth.    . Cholecalciferol (VITAMIN D3) 1000 units CAPS Take by mouth.    . furosemide (LASIX) 20 MG tablet Take 20 mg by mouth.    Marland Kitchen glipiZIDE (GLUCOTROL) 5 MG tablet     . Insulin Glargine (LANTUS SOLOSTAR) 100 UNIT/ML Solostar Pen Use up to 50 units per day as per MD instructions    . Lancets (ONETOUCH ULTRASOFT) lancets by Does not apply route.    Marland Kitchen levofloxacin (LEVAQUIN) 500 MG tablet Take 1 tablet (500 mg total) by  mouth daily. 14 tablet 0  . liraglutide (VICTOZA) 18 MG/3ML SOPN Inject into the skin.    Marland Kitchen losartan (COZAAR) 50 MG tablet Take 1 tablet daily    . metFORMIN (GLUCOPHAGE) 500 MG tablet Take by mouth.    . pregabalin (LYRICA) 100 MG capsule Take by mouth.    . traMADol (ULTRAM) 50 MG tablet Take 1 tablet (50 mg total) by mouth every 6 (six) hours as needed. 30 tablet o   No current facility-administered medications on file prior to visit.    No Known Allergies  No results found for this or any previous visit (from the past 2160 hour(s)).  Objective: There were no vitals filed for this visit.  General: Patient is awake, alert, oriented x 3 and in no acute distress.  Dermatology: Skin is warm and dry bilateral with a partial thickness ulceration present Left sub met 1 to measures 0.5cm x 0.2 cm x 0.2 cm (smaller than previous). There is a keratotic border with a bleeding base. The ulceration no longer probes. No tunneling. No undermining. There is no malodor, no active drainage, no erythema, no edema. No other acute signs of infection. Nails 1-5 on right and 3-5 on left are mycotic but short.    Vascular: Dorsalis Pedis pulse = 1/4 Bilateral,  Posterior Tibial pulse = 0/4 Bilateral,  Capillary Fill Time < 5 seconds  Neurologic: Vibratory and Protective sensation absent bilateral L>R.  Musculosketal: Mild pain  with palpation to ulcerated area. No pain with compression to calves bilateral. Amputated left 1st and 2nd toe.   Assessment and Plan:  Problem List Items Addressed This Visit    None    Visit Diagnoses    Diabetic ulcer of other part of left foot associated with type 2 diabetes mellitus, limited to breakdown of skin (HCC)    -  Primary   Left foot pain       Amputated toe of left foot (HCC)       Diabetic polyneuropathy associated with type 2 diabetes mellitus (HCC)         -Examined patient and discussed the progression of the wound and treatment alternatives. - Excisionally  dedbrided ulceration to healthy bleeding borders using a sterile chisel  blade. -Applied oasis trilayer 3x7cm wound matrix lot [id number] to left foot ulceration secured with steristrips. 50% of wound matrix used. Wound matrix was covered with 4x4, kerlix, and coban. Patient to change outer dressings only -Continue with post op shoe to assist with offloading ulceration area - Advised patient to go to the ER or return to office if the wound worsens or if constitutional symptoms are present. -Patient to return to office in 2-3 weeks for follow up care/reapplication of oasis and evaluation or sooner if problems arise.   Titorya Stover, DPM 

## 2016-10-24 ENCOUNTER — Ambulatory Visit (INDEPENDENT_AMBULATORY_CARE_PROVIDER_SITE_OTHER): Payer: BLUE CROSS/BLUE SHIELD | Admitting: Sports Medicine

## 2016-10-24 ENCOUNTER — Encounter: Payer: Self-pay | Admitting: Sports Medicine

## 2016-10-24 DIAGNOSIS — R0989 Other specified symptoms and signs involving the circulatory and respiratory systems: Secondary | ICD-10-CM

## 2016-10-24 DIAGNOSIS — M79672 Pain in left foot: Secondary | ICD-10-CM

## 2016-10-24 DIAGNOSIS — E11621 Type 2 diabetes mellitus with foot ulcer: Secondary | ICD-10-CM | POA: Diagnosis not present

## 2016-10-24 DIAGNOSIS — S98132A Complete traumatic amputation of one left lesser toe, initial encounter: Secondary | ICD-10-CM

## 2016-10-24 DIAGNOSIS — E1142 Type 2 diabetes mellitus with diabetic polyneuropathy: Secondary | ICD-10-CM | POA: Diagnosis not present

## 2016-10-24 DIAGNOSIS — L97521 Non-pressure chronic ulcer of other part of left foot limited to breakdown of skin: Secondary | ICD-10-CM

## 2016-10-24 DIAGNOSIS — Z89422 Acquired absence of other left toe(s): Secondary | ICD-10-CM

## 2016-10-24 NOTE — Progress Notes (Signed)
    Subjective: [name] is a 65 y.o. male patient seen in office for follow up evaluation of ulceration of the Left foot. Patient has a history of diabetes and a blood glucose level today "same".  Patient is changing the outer dressings with no issues states that the area does not hurt. Denies nausea/fever/vomiting/chills/night sweats/shortness of breath/pain. Patient has no other pedal complaints at this time.  Patient Active Problem List   Diagnosis Date Noted  . History of amputation of lesser toe of left foot (HCC) 08/18/2015  . Hyperlipidemia, mixed 08/18/2015  . Type 2 diabetes mellitus with peripheral neuropathy (HCC) 08/18/2015  . Acute CVA (cerebrovascular accident) (HCC) 08/09/2015  . Acute renal failure superimposed on stage 3 chronic kidney disease (HCC) 08/09/2015  . Essential hypertriglyceridemia 08/09/2015  . Hypertension, essential 08/09/2015  . Right-sided muscle weakness 08/09/2015  . T2DM (type 2 diabetes mellitus) (HCC) 08/09/2015  . Tobacco abuse 08/09/2015  . CKD (chronic kidney disease) 04/17/2013  . DM2 (diabetes mellitus, type 2) (HCC) 04/17/2013  . HTN (hypertension) 04/17/2013  . Osteomyelitis of toe of left foot (HCC) 04/17/2013   Current Outpatient Prescriptions on File Prior to Visit  Medication Sig Dispense Refill  . amLODipine (NORVASC) 10 MG tablet Take 5 mg by mouth.    . amLODipine (NORVASC) 5 MG tablet Take by mouth.    . aspirin 81 MG chewable tablet Chew by mouth.    . aspirin EC 81 MG tablet Take 81 mg by mouth.    . atorvastatin (LIPITOR) 80 MG tablet Take 80 mg by mouth.    . Cholecalciferol (VITAMIN D3) 1000 units CAPS Take by mouth.    . furosemide (LASIX) 20 MG tablet Take 20 mg by mouth.    . glipiZIDE (GLUCOTROL) 5 MG tablet     . Insulin Glargine (LANTUS SOLOSTAR) 100 UNIT/ML Solostar Pen Use up to 50 units per day as per MD instructions    . Lancets (ONETOUCH ULTRASOFT) lancets by Does not apply route.    . levofloxacin  (LEVAQUIN) 500 MG tablet Take 1 tablet (500 mg total) by mouth daily. 14 tablet 0  . liraglutide (VICTOZA) 18 MG/3ML SOPN Inject into the skin.    . losartan (COZAAR) 50 MG tablet Take 1 tablet daily    . metFORMIN (GLUCOPHAGE) 500 MG tablet Take by mouth.    . pregabalin (LYRICA) 100 MG capsule Take by mouth.    . traMADol (ULTRAM) 50 MG tablet Take 1 tablet (50 mg total) by mouth every 6 (six) hours as needed. 30 tablet o   No current facility-administered medications on file prior to visit.    No Known Allergies  No results found for this or any previous visit (from the past 2160 hour(s)).  Objective: There were no vitals filed for this visit.  General: Patient is awake, alert, oriented x 3 and in no acute distress.  Dermatology: Skin is warm and dry bilateral with a now healed ulceration Left sub met 1 with reactive keratotic border with no underlying opening.  There is no malodor, no active drainage, no erythema, no edema. No other acute signs of infection. Nails 1-5 on right and 3-5 on left are mycotic but short.    Vascular: Dorsalis Pedis pulse = 1/4 Bilateral,  Posterior Tibial pulse = 0/4 Bilateral,  Capillary Fill Time < 5 seconds  Neurologic: Vibratory and Protective sensation absent bilateral L>R.  Musculosketal: Mild pain with palpation to ulcerated area. No pain with compression to calves bilateral.   Amputated left 1st and 2nd toe.   Assessment and Plan:  Problem List Items Addressed This Visit    None    Visit Diagnoses    Diabetic ulcer of other part of left foot associated with type 2 diabetes mellitus, limited to breakdown of skin ([place])    -  Primary   healed   Left foot pain       Amputated toe of left foot ([place])       Diabetic polyneuropathy associated with type 2 diabetes mellitus ([place])       Decreased pedal pulses         -Examined patient and discussed the progression of the healed wound and treatment alternatives. - Excisionally dedbrided reactive  keratosis revealing no underlying opening; Ulceration is healed; No dressings needed  -Continue with offloading padding and patient may slowly transition to normal shoe - Advised patient to go to the ER or return to office if the wound worsens or if constitutional symptoms are present. -Patient to return to office in 4 weeks for follow up care/final healed ulcer check or sooner if problems arise.   [name], DPM

## 2016-11-21 ENCOUNTER — Ambulatory Visit (INDEPENDENT_AMBULATORY_CARE_PROVIDER_SITE_OTHER): Payer: BLUE CROSS/BLUE SHIELD | Admitting: Sports Medicine

## 2016-11-21 ENCOUNTER — Encounter: Payer: Self-pay | Admitting: Sports Medicine

## 2016-11-21 DIAGNOSIS — L97521 Non-pressure chronic ulcer of other part of left foot limited to breakdown of skin: Secondary | ICD-10-CM

## 2016-11-21 DIAGNOSIS — E1142 Type 2 diabetes mellitus with diabetic polyneuropathy: Secondary | ICD-10-CM | POA: Diagnosis not present

## 2016-11-21 DIAGNOSIS — Z89422 Acquired absence of other left toe(s): Secondary | ICD-10-CM | POA: Diagnosis not present

## 2016-11-21 DIAGNOSIS — S98132A Complete traumatic amputation of one left lesser toe, initial encounter: Secondary | ICD-10-CM

## 2016-11-21 DIAGNOSIS — E11621 Type 2 diabetes mellitus with foot ulcer: Secondary | ICD-10-CM | POA: Diagnosis not present

## 2016-11-21 DIAGNOSIS — M79672 Pain in left foot: Secondary | ICD-10-CM | POA: Diagnosis not present

## 2016-11-21 NOTE — Progress Notes (Signed)
Subjective: [name] is a 66 y.o. male patient seen in office for follow up evaluation of the healed ulceration of the Left foot. Patient has a history of diabetes and a blood glucose level today "same".  Patient is applying offloading pad to area. Denies nausea/fever/vomiting/chills/night sweats/shortness of breath/pain. Patient has no other pedal complaints at this time.  Patient Active Problem List   Diagnosis Date Noted  . History of amputation of lesser toe of left foot ([place]) 08/18/2015  . Hyperlipidemia, mixed 08/18/2015  . Type 2 diabetes mellitus with peripheral neuropathy ([place]) 08/18/2015  . Acute CVA (cerebrovascular accident) ([place]) 08/09/2015  . Acute renal failure superimposed on stage 3 chronic kidney disease ([place]) 08/09/2015  . Essential hypertriglyceridemia 08/09/2015  . Hypertension, essential 08/09/2015  . Right-sided muscle weakness 08/09/2015  . T2DM (type 2 diabetes mellitus) ([place]) 08/09/2015  . Tobacco abuse 08/09/2015  . CKD (chronic kidney disease) 04/17/2013  . DM2 (diabetes mellitus, type 2) ([place]) 04/17/2013  . HTN (hypertension) 04/17/2013  . Osteomyelitis of toe of left foot ([place]) 04/17/2013   Current Outpatient Prescriptions on File Prior to Visit  Medication Sig Dispense Refill  . amLODipine (NORVASC) 10 MG tablet Take 5 mg by mouth.    Marland Kitchen amLODipine (NORVASC) 5 MG tablet Take by mouth.    Marland Kitchen aspirin 81 MG chewable tablet Chew by mouth.    Marland Kitchen aspirin EC 81 MG tablet Take 81 mg by mouth.    Marland Kitchen atorvastatin (LIPITOR) 80 MG tablet Take 80 mg by mouth.    . Cholecalciferol (VITAMIN D3) 1000 units CAPS Take by mouth.    . furosemide (LASIX) 20 MG tablet Take 20 mg by mouth.    Marland Kitchen glipiZIDE (GLUCOTROL) 5 MG tablet     . Insulin Glargine (LANTUS SOLOSTAR) 100 UNIT/ML Solostar Pen Use up to 50 units per day as per MD instructions    . Lancets (ONETOUCH ULTRASOFT) lancets by Does not apply route.    Marland Kitchen levofloxacin (LEVAQUIN) 500 MG tablet Take 1 tablet  (500 mg total) by mouth daily. 14 tablet 0  . liraglutide (VICTOZA) 18 MG/3ML SOPN Inject into the skin.    Marland Kitchen losartan (COZAAR) 50 MG tablet Take 1 tablet daily    . metFORMIN (GLUCOPHAGE) 500 MG tablet Take by mouth.    . pregabalin (LYRICA) 100 MG capsule Take by mouth.    . traMADol (ULTRAM) 50 MG tablet Take 1 tablet (50 mg total) by mouth every 6 (six) hours as needed. 30 tablet o   No current facility-administered medications on file prior to visit.    No Known Allergies  No results found for this or any previous visit (from the past 2160 hour(s)).  Objective: There were no vitals filed for this visit.  General: Patient is awake, alert, oriented x 3 and in no acute distress.  Dermatology: Skin is warm and dry bilateral with a  healed ulceration Left sub met 1 with reactive keratotic border with no underlying opening.  There is no malodor, no active drainage, no erythema, no edema. No other acute signs of infection. Nails 1-5 on right and 3-5 on left are mycotic but short.    Vascular: Dorsalis Pedis pulse = 1/4 Bilateral,  Posterior Tibial pulse = 0/4 Bilateral,  Capillary Fill Time < 5 seconds  Neurologic: Vibratory and Protective sensation absent bilateral L>R.  Musculosketal: No pain with palpation to healed previous ulcerated area. No pain with compression to calves bilateral. Amputated left 1st and 2nd  toe.   Assessment and Plan:  Problem List Items Addressed This Visit    None    Visit Diagnoses    Diabetic ulcer of other part of left foot associated with type 2 diabetes mellitus, limited to breakdown of skin ([place])    -  Primary   healed   Left foot pain       Amputated toe of left foot ([place])       Diabetic polyneuropathy associated with type 2 diabetes mellitus ([place])         -Examined patient and discussed the progression of the healed wound and treatment alternatives. -Ulceration is healed; No dressings needed  -Continue with offloading padding and patient may  slowly transition to normal shoe with padding; recommend custom shoes and insert with toe filler on left; Patient to let us know when he has a PCP so we can do the paperwork for shoes and inserts -Patient to return to office in 8 weeks for follow up care/final healed ulcer check or sooner if problems arise.   [name], DPM

## 2017-01-16 ENCOUNTER — Ambulatory Visit: Payer: BLUE CROSS/BLUE SHIELD | Admitting: Sports Medicine

## 2018-02-20 DIAGNOSIS — Z6826 Body mass index (BMI) 26.0-26.9, adult: Secondary | ICD-10-CM | POA: Diagnosis not present

## 2018-02-20 DIAGNOSIS — N189 Chronic kidney disease, unspecified: Secondary | ICD-10-CM | POA: Diagnosis not present

## 2018-07-09 DIAGNOSIS — R739 Hyperglycemia, unspecified: Secondary | ICD-10-CM | POA: Diagnosis not present

## 2018-07-09 DIAGNOSIS — L03116 Cellulitis of left lower limb: Secondary | ICD-10-CM | POA: Diagnosis not present

## 2018-07-09 DIAGNOSIS — Z8639 Personal history of other endocrine, nutritional and metabolic disease: Secondary | ICD-10-CM | POA: Diagnosis not present

## 2018-07-09 DIAGNOSIS — Z760 Encounter for issue of repeat prescription: Secondary | ICD-10-CM | POA: Diagnosis not present

## 2018-07-09 DIAGNOSIS — N189 Chronic kidney disease, unspecified: Secondary | ICD-10-CM | POA: Diagnosis not present

## 2018-07-16 ENCOUNTER — Ambulatory Visit (INDEPENDENT_AMBULATORY_CARE_PROVIDER_SITE_OTHER): Payer: Medicare Other

## 2018-07-16 ENCOUNTER — Encounter: Payer: Self-pay | Admitting: Sports Medicine

## 2018-07-16 ENCOUNTER — Ambulatory Visit (INDEPENDENT_AMBULATORY_CARE_PROVIDER_SITE_OTHER): Payer: Medicare Other | Admitting: Sports Medicine

## 2018-07-16 DIAGNOSIS — L97521 Non-pressure chronic ulcer of other part of left foot limited to breakdown of skin: Secondary | ICD-10-CM | POA: Diagnosis not present

## 2018-07-16 DIAGNOSIS — M79672 Pain in left foot: Secondary | ICD-10-CM

## 2018-07-16 DIAGNOSIS — E11621 Type 2 diabetes mellitus with foot ulcer: Secondary | ICD-10-CM | POA: Diagnosis not present

## 2018-07-16 DIAGNOSIS — L97529 Non-pressure chronic ulcer of other part of left foot with unspecified severity: Secondary | ICD-10-CM

## 2018-07-16 DIAGNOSIS — R0989 Other specified symptoms and signs involving the circulatory and respiratory systems: Secondary | ICD-10-CM

## 2018-07-16 DIAGNOSIS — E08621 Diabetes mellitus due to underlying condition with foot ulcer: Secondary | ICD-10-CM | POA: Diagnosis not present

## 2018-07-16 DIAGNOSIS — L97522 Non-pressure chronic ulcer of other part of left foot with fat layer exposed: Secondary | ICD-10-CM

## 2018-07-16 DIAGNOSIS — S98132A Complete traumatic amputation of one left lesser toe, initial encounter: Secondary | ICD-10-CM

## 2018-07-16 NOTE — Progress Notes (Signed)
Subjective: [name] is a 67 y.o. male patient seen in office for follow up evaluation of the healed ulceration of the Left foot. Patient has a history of diabetes and a blood glucose level today not recorded but yesterday was 118.  Patient reports that about 2 weeks ago he pulled off callus that caused the wound and has been started on antibiotics by his primary care doctor with some improvement has been dressing area using Betadine.  Denies nausea/fever/vomiting/chills/night sweats/shortness of breath/pain. Patient has no other pedal complaints at this time.  Patient Active Problem List   Diagnosis Date Noted  . History of amputation of lesser toe of left foot ([place]) 08/18/2015  . Hyperlipidemia, mixed 08/18/2015  . Type 2 diabetes mellitus with peripheral neuropathy ([place]) 08/18/2015  . Acute CVA (cerebrovascular accident) ([place]) 08/09/2015  . Acute renal failure superimposed on stage 3 chronic kidney disease ([place]) 08/09/2015  . Essential hypertriglyceridemia 08/09/2015  . Hypertension, essential 08/09/2015  . Right-sided muscle weakness 08/09/2015  . T2DM (type 2 diabetes mellitus) ([place]) 08/09/2015  . Tobacco abuse 08/09/2015  . CKD (chronic kidney disease) 04/17/2013  . DM2 (diabetes mellitus, type 2) ([place]) 04/17/2013  . HTN (hypertension) 04/17/2013  . Osteomyelitis of toe of left foot ([place]) 04/17/2013   Current Outpatient Medications on File Prior to Visit  Medication Sig Dispense Refill  . amLODipine (NORVASC) 10 MG tablet Take 5 mg by mouth.    Marland Kitchen amLODipine (NORVASC) 5 MG tablet Take by mouth.    Marland Kitchen aspirin 81 MG chewable tablet Chew by mouth.    Marland Kitchen aspirin EC 81 MG tablet Take 81 mg by mouth.    Marland Kitchen atorvastatin (LIPITOR) 80 MG tablet Take 80 mg by mouth.    . Cholecalciferol (VITAMIN D3) 1000 units CAPS Take by mouth.    . furosemide (LASIX) 20 MG tablet Take 20 mg by mouth.    Marland Kitchen glipiZIDE (GLUCOTROL) 5 MG tablet     . Insulin Glargine (LANTUS SOLOSTAR) 100 UNIT/ML  Solostar Pen Use up to 50 units per day as per MD instructions    . Lancets (ONETOUCH ULTRASOFT) lancets by Does not apply route.    Marland Kitchen levofloxacin (LEVAQUIN) 500 MG tablet Take 1 tablet (500 mg total) by mouth daily. 14 tablet 0  . liraglutide (VICTOZA) 18 MG/3ML SOPN Inject into the skin.    Marland Kitchen losartan (COZAAR) 50 MG tablet Take 1 tablet daily    . metFORMIN (GLUCOPHAGE) 500 MG tablet Take by mouth.    . pregabalin (LYRICA) 100 MG capsule Take by mouth.    . traMADol (ULTRAM) 50 MG tablet Take 1 tablet (50 mg total) by mouth every 6 (six) hours as needed. 30 tablet o   No current facility-administered medications on file prior to visit.    No Known Allergies  No results found for this or any previous visit (from the past 2160 hour(s)).  Objective: There were no vitals filed for this visit.  General: Patient is awake, alert, oriented x 3 and in no acute distress.  Dermatology: Skin is warm and dry bilateral with a  healed ulceration Left sub met 1 with reactive keratotic borde central wound that once debrided reveals macerated mix of granular tissue and a wound that measures 4 x 4 x 0.6 cm there is no ma,lodor, no active drainage, no erythema, no edema. No other acute signs of infection. Nails 1-5 on right and 3-5 on left are mycotic but short.    Vascular: Dorsalis  Pedis pulse = 1/4 Bilateral,  Posterior Tibial pulse = 0/4 Bilateral,  Capillary Fill Time < 5 seconds  Neurologic: Vibratory and Protective sensation absent bilateral L>R.  Musculosketal: No pain with palpation to ulcerated area plantar aspect of first metatarsal on left foot. No pain with compression to calves bilateral. Amputated left 1st and 2nd toe.   X-rays left foot shows no obvious bony destruction at the first metatarsal however does show ulcer defect and significant contracture and deformity of lesser toes.  Patient is status post amputation of hallux and amputation of second toe plus metatarsal head resection.  No  other acute findings.  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 due to underlying condition, with fat layer exposed ([place])    -  Primary   Relevant Orders   WOUND CULTURE   Foot pain, left       Relevant Orders   DG Foot Complete Left   Amputated toe of left foot (HCC)       Decreased pedal pulses         -Examined patient and discussed the progression of the recurrent wound and treatment alternatives. -X-rays reviewed; advised patient if this continues to recur may benefit from resection of metatarsal head if seems to be slow to no healing - Excisionally dedbrided ulceration at plantar first metatarsal on left to healthy bleeding borders removing nonviable tissue using a sterile chisel blade. Wound measures post debridement as above. Wound was debrided to the level of the dermis and subcutaneous tissue with viable wound base exposed to promote healing. Hemostasis was achieved with manuel pressure. Patient tolerated procedure well without any discomfort or anesthesia necessary for this wound debridement.  Wound culture obtained for surveillance will call patient if additional antibiotic is needed by mouth. -Applied Iodosorb and dry sterile dressing and instructed patient to continue with daily dressings at home consisting of the same or Betadine with dry dressing and offloading padding - Advised patient to go to the ER or return to office if the wound worsens or if constitutional symptoms are present. -Patient to return to office in 1 to 2 weeks or sooner if problems arise.   [name], DPM

## 2018-07-18 ENCOUNTER — Telehealth: Payer: Self-pay | Admitting: *Deleted

## 2018-07-18 NOTE — Telephone Encounter (Signed)
-----   Message from [name], [place] sent at 07/16/2018 12:35 PM EDT ----- Regarding: [employer] wound care supplies Iodosorb, 4x4, kerlix, and coban Wound 4x4x0.6cm left plantar 1st metatarsal  -Dr. Cannon Kettle

## 2018-07-18 NOTE — Telephone Encounter (Signed)
Faxed required form, and demographics to [facility].

## 2018-07-19 LAB — WOUND CULTURE
MICRO NUMBER: 91238563
SPECIMEN QUALITY: ADEQUATE

## 2018-07-22 ENCOUNTER — Telehealth: Payer: Self-pay | Admitting: *Deleted

## 2018-07-22 MED ORDER — AMOXICILLIN-POT CLAVULANATE 875-125 MG PO TABS: 1.0000 | ORAL_TABLET | Freq: Two times a day (BID) | ORAL | 0 refills | Status: DC

## 2018-07-22 NOTE — Telephone Encounter (Signed)
-----   Message from [name], [place] sent at 07/19/2018 12:45 PM EDT ----- Please send Augmentin [phone] twice daily x2 weeks to pharmacy for positive culture

## 2018-07-23 ENCOUNTER — Encounter: Payer: Self-pay | Admitting: Sports Medicine

## 2018-07-23 ENCOUNTER — Ambulatory Visit (INDEPENDENT_AMBULATORY_CARE_PROVIDER_SITE_OTHER): Payer: Medicare Other | Admitting: Sports Medicine

## 2018-07-23 DIAGNOSIS — R0989 Other specified symptoms and signs involving the circulatory and respiratory systems: Secondary | ICD-10-CM

## 2018-07-23 DIAGNOSIS — L97522 Non-pressure chronic ulcer of other part of left foot with fat layer exposed: Secondary | ICD-10-CM | POA: Diagnosis not present

## 2018-07-23 DIAGNOSIS — E08621 Diabetes mellitus due to underlying condition with foot ulcer: Secondary | ICD-10-CM | POA: Diagnosis not present

## 2018-07-23 DIAGNOSIS — M79672 Pain in left foot: Secondary | ICD-10-CM

## 2018-07-23 DIAGNOSIS — S98132A Complete traumatic amputation of one left lesser toe, initial encounter: Secondary | ICD-10-CM

## 2018-07-23 NOTE — Progress Notes (Signed)
Subjective: [name] is a 67 y.o. male patient seen in office for follow up evaluation of the healed ulceration of the Left foot. Patient has a history of diabetes and a blood glucose level today not recorded but yesterday was 120.  Patient reports that the wound is better and has been dressing iodosorb.  Denies nausea/fever/vomiting/chills/night sweats/shortness of breath/pain. Patient has no other pedal complaints at this time.  Patient Active Problem List   Diagnosis Date Noted  . History of amputation of lesser toe of left foot ([place]) 08/18/2015  . Hyperlipidemia, mixed 08/18/2015  . Type 2 diabetes mellitus with peripheral neuropathy ([place]) 08/18/2015  . Acute CVA (cerebrovascular accident) ([place]) 08/09/2015  . Acute renal failure superimposed on stage 3 chronic kidney disease ([place]) 08/09/2015  . Essential hypertriglyceridemia 08/09/2015  . Hypertension, essential 08/09/2015  . Right-sided muscle weakness 08/09/2015  . T2DM (type 2 diabetes mellitus) ([place]) 08/09/2015  . Tobacco abuse 08/09/2015  . CKD (chronic kidney disease) 04/17/2013  . DM2 (diabetes mellitus, type 2) ([place]) 04/17/2013  . HTN (hypertension) 04/17/2013  . Osteomyelitis of toe of left foot ([place]) 04/17/2013   Current Outpatient Medications on File Prior to Visit  Medication Sig Dispense Refill  . amLODipine (NORVASC) 10 MG tablet     . amoxicillin-clavulanate (AUGMENTIN) 875-125 MG tablet Take 1 tablet by mouth 2 (two) times daily. 28 tablet 0  . aspirin 81 MG chewable tablet Chew by mouth.    Marland Kitchen atorvastatin (LIPITOR) 80 MG tablet     . BISACODYL PO     . Blood Glucose Monitoring Suppl (TRUE METRIX METER) w/Device KIT   0  . Cholecalciferol (VITAMIN D3) 1000 units CAPS Take by mouth.    . doxycycline (VIBRA-TABS) 100 MG tablet   0  . furosemide (LASIX) 20 MG tablet Take 20 mg by mouth.    Marland Kitchen glipiZIDE (GLUCOTROL) 10 MG tablet   1  . glucose blood (CONTOUR TEST) test strip Use 2 (two) times daily.    . Insulin  Glargine (LANTUS SOLOSTAR) 100 UNIT/ML Solostar Pen Use up to 50 units per day as per MD instructions    . Lancets (ONETOUCH ULTRASOFT) lancets by Does not apply route.    Marland Kitchen levofloxacin (LEVAQUIN) 500 MG tablet Take 1 tablet (500 mg total) by mouth daily. 14 tablet 0  . liraglutide (VICTOZA) 18 MG/3ML SOPN Inject into the skin.    Marland Kitchen losartan (COZAAR) 100 MG tablet Take by mouth.    . pregabalin (LYRICA) 100 MG capsule Take by mouth.    . traMADol (ULTRAM) 50 MG tablet Take 1 tablet (50 mg total) by mouth every 6 (six) hours as needed. 30 tablet o  . UNABLE TO FIND Diabetic Supplies:  Glucometer, test strips, lancets. Check glucose BID (fasting and 2 hours after eating). One month supply. Refills PRN.    . metFORMIN (GLUCOPHAGE) 500 MG tablet Take by mouth.     No current facility-administered medications on file prior to visit.    No Known Allergies  Recent Results (from the past 2160 hour(s))  WOUND CULTURE     Status: Abnormal   Collection Time: 07/16/18 12:37 PM  Result Value Ref Range   MICRO NUMBER: [id number]    SPECIMEN QUALITY: ADEQUATE    SOURCE: LEFT FOOT ULCER    STATUS: FINAL    GRAM STAIN:      No white blood cells seen Moderate epithelial cells Many Gram negative bacilli Moderate Gram positive cocci in clusters   ISOLATE 1: Citrobacter  koseri (A)     Comment: Heavy growth of Citrobacter koseri      Susceptibility   Citrobacter koseri - AEROBIC CULT, GRAM STAIN NEGATIVE 1    AMOX/CLAVULANIC 4 Sensitive     CEFAZOLIN* <=4 Not Reportable      * For infections other than uncomplicated UTIcaused by E. coli, K. pneumoniae or P. mirabilis:Cefazolin is resistant if MIC > or = 8 mcg/mL.(Distinguishing susceptible versus intermediatefor isolates with MIC < or = 4 mcg/mL requiresadditional testing.)    CEFEPIME <=1 Sensitive     CEFTRIAXONE <=1 Sensitive     CIPROFLOXACIN <=0.25 Sensitive     LEVOFLOXACIN <=0.12 Sensitive     ERTAPENEM <=0.5 Sensitive     GENTAMICIN <=1  Sensitive     IMIPENEM <=0.25 Sensitive     PIP/TAZO 16 Sensitive     TOBRAMYCIN <=1 Sensitive     TRIMETH/SULFA* <=20 Sensitive      * For infections other than uncomplicated UTIcaused by E. coli, K. pneumoniae or P. mirabilis:Cefazolin is resistant if MIC > or = 8 mcg/mL.(Distinguishing susceptible versus intermediatefor isolates with MIC < or = 4 mcg/mL requiresadditional testing.)Legend:S = Susceptible  I = IntermediateR = Resistant  NS = Not susceptible* = Not tested  NR = Not reported**NN = See antimicrobic comments    Objective: There were no vitals filed for this visit.  General: Patient is awake, alert, oriented x 3 and in no acute distress.  Dermatology: Skin is warm and dry bilateral with a  healed ulceration Left sub met 1 wound with granular tissue that has a skin island the wound itself at the medial aspect measures 0.3 x 0.4 cm and the wound at the lateral aspect measures 1 x 0.5 x 0.2 cm much improved since last visit with mild reactive keratosis and mild maceration, there is no malodor, no active drainage, no erythema, no edema. No other acute signs of infection. Nails 1-5 on right and 3-5 on left are mycotic but short.    Vascular: Dorsalis Pedis pulse = 1/4 Bilateral,  Posterior Tibial pulse = 0/4 Bilateral,  Capillary Fill Time < 5 seconds  Neurologic: Vibratory and Protective sensation absent bilateral L>R.  Musculosketal: No pain with palpation to ulcerated area plantar aspect of first metatarsal on left foot. No pain with compression to calves bilateral. Amputated left 1st and 2nd toe.   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 -Continue with Augmentin - Excisionally dedbrided ulceration at plantar first metatarsal on left to healthy bleeding borders removing nonviable tissue using a sterile chisel blade. Wound measures post debridement as above. Wound was debrided to the level of the dermis and subcutaneous tissue with viable wound base exposed to promote healing. Hemostasis was achieved with manuel pressure. Patient  tolerated procedure well without any discomfort or anesthesia necessary for this wound debridement.  -Applied Iodosorb and dry sterile dressing and instructed patient to continue with daily dressings at home consisting of the same. - Advised patient to go to the ER or return to office if the wound worsens or if constitutional symptoms are present. -Patient to return to office in 2 weeks or sooner if problems arise.   [name], DPM

## 2018-08-06 ENCOUNTER — Ambulatory Visit (INDEPENDENT_AMBULATORY_CARE_PROVIDER_SITE_OTHER): Payer: Medicare Other | Admitting: Sports Medicine

## 2018-08-06 ENCOUNTER — Encounter: Payer: Self-pay | Admitting: Sports Medicine

## 2018-08-06 DIAGNOSIS — L97521 Non-pressure chronic ulcer of other part of left foot limited to breakdown of skin: Secondary | ICD-10-CM

## 2018-08-06 DIAGNOSIS — R0989 Other specified symptoms and signs involving the circulatory and respiratory systems: Secondary | ICD-10-CM

## 2018-08-06 DIAGNOSIS — E08621 Diabetes mellitus due to underlying condition with foot ulcer: Secondary | ICD-10-CM

## 2018-08-06 DIAGNOSIS — M79672 Pain in left foot: Secondary | ICD-10-CM

## 2018-08-06 DIAGNOSIS — S98132A Complete traumatic amputation of one left lesser toe, initial encounter: Secondary | ICD-10-CM

## 2018-08-06 NOTE — Progress Notes (Signed)
Subjective: [name] is a 67 y.o. male patient seen in office for follow up evaluation of the healed ulceration of the Left foot. Patient has a history of diabetes and a blood glucose level today not recorded.  Patient reports that he has been consistent with applying the Iodosorb daily and feels like the wound has much improved.  Denies any drainage or any acute pain.  Denies nausea/fever/vomiting/chills/night sweats/shortness of breath/pain. Patient has no other pedal complaints at this time.  Patient Active Problem List   Diagnosis Date Noted  . History of amputation of lesser toe of left foot ([place]) 08/18/2015  . Hyperlipidemia, mixed 08/18/2015  . Type 2 diabetes mellitus with peripheral neuropathy ([place]) 08/18/2015  . Acute CVA (cerebrovascular accident) ([place]) 08/09/2015  . Acute renal failure superimposed on stage 3 chronic kidney disease ([place]) 08/09/2015  . Essential hypertriglyceridemia 08/09/2015  . Hypertension, essential 08/09/2015  . Right-sided muscle weakness 08/09/2015  . T2DM (type 2 diabetes mellitus) ([place]) 08/09/2015  . Tobacco abuse 08/09/2015  . CKD (chronic kidney disease) 04/17/2013  . DM2 (diabetes mellitus, type 2) ([place]) 04/17/2013  . HTN (hypertension) 04/17/2013  . Osteomyelitis of toe of left foot ([place]) 04/17/2013   Current Outpatient Medications on File Prior to Visit  Medication Sig Dispense Refill  . amLODipine (NORVASC) 10 MG tablet     . amoxicillin-clavulanate (AUGMENTIN) 875-125 MG tablet Take 1 tablet by mouth 2 (two) times daily. 28 tablet 0  . aspirin 81 MG chewable tablet Chew by mouth.    Marland Kitchen atorvastatin (LIPITOR) 80 MG tablet     . BISACODYL PO     . Blood Glucose Monitoring Suppl (TRUE METRIX METER) w/Device KIT   0  . Cholecalciferol (VITAMIN D3) 1000 units CAPS Take by mouth.    . doxycycline (VIBRA-TABS) 100 MG tablet   0  . furosemide (LASIX) 20 MG tablet Take 20 mg by mouth.    Marland Kitchen glipiZIDE (GLUCOTROL) 10 MG tablet   1  . glucose blood  (CONTOUR TEST) test strip Use 2 (two) times daily.    . Insulin Glargine (LANTUS SOLOSTAR) 100 UNIT/ML Solostar Pen Use up to 50 units per day as per MD instructions    . Lancets (ONETOUCH ULTRASOFT) lancets by Does not apply route.    Marland Kitchen levofloxacin (LEVAQUIN) 500 MG tablet Take 1 tablet (500 mg total) by mouth daily. 14 tablet 0  . liraglutide (VICTOZA) 18 MG/3ML SOPN Inject into the skin.    Marland Kitchen losartan (COZAAR) 100 MG tablet Take by mouth.    . metFORMIN (GLUCOPHAGE) 500 MG tablet Take by mouth.    . pregabalin (LYRICA) 100 MG capsule Take by mouth.    . traMADol (ULTRAM) 50 MG tablet Take 1 tablet (50 mg total) by mouth every 6 (six) hours as needed. 30 tablet o  . UNABLE TO FIND Diabetic Supplies:  Glucometer, test strips, lancets. Check glucose BID (fasting and 2 hours after eating). One month supply. Refills PRN.     No current facility-administered medications on file prior to visit.    No Known Allergies  Recent Results (from the past 2160 hour(s))  WOUND CULTURE     Status: Abnormal   Collection Time: 07/16/18 12:37 PM  Result Value Ref Range   MICRO NUMBER: [id number]    SPECIMEN QUALITY: ADEQUATE    SOURCE: LEFT FOOT ULCER    STATUS: FINAL    GRAM STAIN:      No white blood cells seen Moderate epithelial cells Many Gram negative  bacilli Moderate Gram positive cocci in clusters   ISOLATE 1: Citrobacter koseri (A)     Comment: Heavy growth of Citrobacter koseri      Susceptibility   Citrobacter koseri - AEROBIC CULT, GRAM STAIN NEGATIVE 1    AMOX/CLAVULANIC 4 Sensitive     CEFAZOLIN* <=4 Not Reportable      * For infections other than uncomplicated UTIcaused by E. coli, K. pneumoniae or P. mirabilis:Cefazolin is resistant if MIC > or = 8 mcg/mL.(Distinguishing susceptible versus intermediatefor isolates with MIC < or = 4 mcg/mL requiresadditional testing.)    CEFEPIME <=1 Sensitive     CEFTRIAXONE <=1 Sensitive     CIPROFLOXACIN <=0.25 Sensitive     LEVOFLOXACIN <=0.12  Sensitive     ERTAPENEM <=0.5 Sensitive     GENTAMICIN <=1 Sensitive     IMIPENEM <=0.25 Sensitive     PIP/TAZO 16 Sensitive     TOBRAMYCIN <=1 Sensitive     TRIMETH/SULFA* <=20 Sensitive      * For infections other than uncomplicated UTIcaused by E. coli, K. pneumoniae or P. mirabilis:Cefazolin is resistant if MIC > or = 8 mcg/mL.(Distinguishing susceptible versus intermediatefor isolates with MIC < or = 4 mcg/mL requiresadditional testing.)Legend:S = Susceptible  I = IntermediateR = Resistant  NS = Not susceptible* = Not tested  NR = Not reported**NN = See antimicrobic comments    Objective: There were no vitals filed for this visit.  General: Patient is awake, alert, oriented x 3 and in no acute distress.  Dermatology: Skin is warm and dry bilateral with a ulceration Left sub met 1 wound with granular tissue measures 0.2x0.2 cm with mild reactive keratosis, there is no malodor, no active drainage, no erythema, no edema. No other acute signs of infection. Nails 1-5 on right and 3-5 on left are mycotic but short.    Vascular: Dorsalis Pedis pulse = 1/4 Bilateral,  Posterior Tibial pulse = 0/4 Bilateral,  Capillary Fill Time < 5 seconds  Neurologic: Vibratory and Protective sensation absent bilateral L>R.  Musculosketal: No pain with palpation to ulcerated area plantar aspect of first metatarsal on left foot. No pain with compression to calves bilateral. Amputated left 1st and 2nd toe.   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 - Excisionally dedbrided ulceration at plantar first metatarsal  on left to healthy bleeding borders removing nonviable tissue using a sterile chisel blade. Wound measures post debridement as above. Wound was debrided to the level of the dermis and subcutaneous tissue with viable wound base exposed to promote healing. Hemostasis was achieved with manuel pressure. Patient tolerated procedure well without any discomfort or anesthesia necessary for this wound debridement.  -Applied Iodosorb and dry sterile dressing and instructed patient to continue with daily dressings at home consisting of the same. - Advised patient to go to the ER or return to office if the wound worsens or if constitutional symptoms are present. -Patient to return to office in 2-3 weeks or sooner if  problems arise.  Advised patient if ulcer is doing well will arrange for casting appointment for custom insoles to transition patient to once wound has healed to prevent re-ulceration.  [name], DPM

## 2018-08-27 ENCOUNTER — Encounter: Payer: Self-pay | Admitting: Sports Medicine

## 2018-08-27 ENCOUNTER — Ambulatory Visit (INDEPENDENT_AMBULATORY_CARE_PROVIDER_SITE_OTHER): Payer: Medicare Other | Admitting: Sports Medicine

## 2018-08-27 DIAGNOSIS — R0989 Other specified symptoms and signs involving the circulatory and respiratory systems: Secondary | ICD-10-CM

## 2018-08-27 DIAGNOSIS — E08621 Diabetes mellitus due to underlying condition with foot ulcer: Secondary | ICD-10-CM

## 2018-08-27 DIAGNOSIS — L97521 Non-pressure chronic ulcer of other part of left foot limited to breakdown of skin: Secondary | ICD-10-CM | POA: Diagnosis not present

## 2018-08-27 DIAGNOSIS — M79672 Pain in left foot: Secondary | ICD-10-CM

## 2018-08-27 DIAGNOSIS — S98132A Complete traumatic amputation of one left lesser toe, initial encounter: Secondary | ICD-10-CM

## 2018-08-27 NOTE — Progress Notes (Signed)
Subjective: [name] is a 67 y.o. male patient seen in office for follow up evaluation of the healed ulceration of the Left foot. Patient has a history of diabetes and a blood glucose level today not recorded.  Patient reports that the area is doing well with no problems no active drainage and no opening.  Denies nausea/fever/vomiting/chills/night sweats/shortness of breath/pain. Patient has no other pedal complaints at this time.  Patient Active Problem List   Diagnosis Date Noted  . History of amputation of lesser toe of left foot ([facility]) 08/18/2015  . Hyperlipidemia, mixed 08/18/2015  . Type 2 diabetes mellitus with peripheral neuropathy ([facility]) 08/18/2015  . Acute CVA (cerebrovascular accident) ([facility]) 08/09/2015  . Acute renal failure superimposed on stage 3 chronic kidney disease ([facility]) 08/09/2015  . Essential hypertriglyceridemia 08/09/2015  . Hypertension, essential 08/09/2015  . Right-sided muscle weakness 08/09/2015  . T2DM (type 2 diabetes mellitus) ([facility]) 08/09/2015  . Tobacco abuse 08/09/2015  . CKD (chronic kidney disease) 04/17/2013  . DM2 (diabetes mellitus, type 2) ([facility]) 04/17/2013  . HTN (hypertension) 04/17/2013  . Osteomyelitis of toe of left foot ([facility]) 04/17/2013   Current Outpatient Medications on File Prior to Visit  Medication Sig Dispense Refill  . amLODipine (NORVASC) 10 MG tablet     . amoxicillin-clavulanate (AUGMENTIN) 875-125 MG tablet Take 1 tablet by mouth 2 (two) times daily. 28 tablet 0  . aspirin 81 MG chewable tablet Chew by mouth.    . atorvastatin (LIPITOR) 80 MG tablet     . BISACODYL PO     . Blood Glucose Monitoring Suppl (TRUE METRIX METER) w/Device KIT   0  . Cholecalciferol (VITAMIN D3) 1000 units CAPS Take by mouth.    . doxycycline (VIBRA-TABS) 100 MG tablet   0  . furosemide (LASIX) 20 MG tablet Take 20 mg by mouth.    . glipiZIDE (GLUCOTROL) 10 MG tablet   1  . glucose blood (CONTOUR TEST) test strip Use 2 (two) times daily.    . Insulin  Glargine (LANTUS SOLOSTAR) 100 UNIT/ML Solostar Pen Use up to 50 units per day as per MD instructions    . JANUVIA 50 MG tablet   0  . Lancets (ONETOUCH ULTRASOFT) lancets by Does not apply route.    . levofloxacin (LEVAQUIN) 500 MG tablet Take 1 tablet (500 mg total) by mouth daily. 14 tablet 0  . liraglutide (VICTOZA) 18 MG/3ML SOPN Inject into the skin.    . losartan (COZAAR) 100 MG tablet Take by mouth.    . pregabalin (LYRICA) 100 MG capsule Take by mouth.    . traMADol (ULTRAM) 50 MG tablet Take 1 tablet (50 mg total) by mouth every 6 (six) hours as needed. 30 tablet o  . ULTICARE MINI PEN NEEDLES 31G X 6 MM MISC   0  . UNABLE TO FIND Diabetic Supplies:  Glucometer, test strips, lancets. Check glucose BID (fasting and 2 hours after eating). One month supply. Refills PRN.    . metFORMIN (GLUCOPHAGE) 500 MG tablet Take by mouth.     No current facility-administered medications on file prior to visit.    No Known Allergies  Recent Results (from the past 2160 hour(s))  WOUND CULTURE     Status: Abnormal   Collection Time: 07/16/18 12:37 PM  Result Value Ref Range   MICRO NUMBER: [id number]    SPECIMEN QUALITY: ADEQUATE    SOURCE: LEFT FOOT ULCER    STATUS: FINAL    GRAM STAIN:        No white blood cells seen Moderate epithelial cells Many Gram negative bacilli Moderate Gram positive cocci in clusters   ISOLATE 1: Citrobacter koseri (A)     Comment: Heavy growth of Citrobacter koseri      Susceptibility   Citrobacter koseri - AEROBIC CULT, GRAM STAIN NEGATIVE 1    AMOX/CLAVULANIC 4 Sensitive     CEFAZOLIN* <=4 Not Reportable      * For infections other than uncomplicated UTIcaused by E. coli, K. pneumoniae or P. mirabilis:Cefazolin is resistant if MIC > or = 8 mcg/mL.(Distinguishing susceptible versus intermediatefor isolates with MIC < or = 4 mcg/mL requiresadditional testing.)    CEFEPIME <=1 Sensitive     CEFTRIAXONE <=1 Sensitive     CIPROFLOXACIN <=0.25 Sensitive      LEVOFLOXACIN <=0.12 Sensitive     ERTAPENEM <=0.5 Sensitive     GENTAMICIN <=1 Sensitive     IMIPENEM <=0.25 Sensitive     PIP/TAZO 16 Sensitive     TOBRAMYCIN <=1 Sensitive     TRIMETH/SULFA* <=20 Sensitive      * For infections other than uncomplicated UTIcaused by E. coli, K. pneumoniae or P. mirabilis:Cefazolin is resistant if MIC > or = 8 mcg/mL.(Distinguishing susceptible versus intermediatefor isolates with MIC < or = 4 mcg/mL requiresadditional testing.)Legend:S = Susceptible  I = IntermediateR = Resistant  NS = Not susceptible* = Not tested  NR = Not reported**NN = See antimicrobic comments    Objective: There were no vitals filed for this visit.  General: Patient is awake, alert, oriented x 3 and in no acute distress.  Dermatology: Skin is warm and dry bilateral with a healed ulceration plantar first metatarsal head left foot, no active drainage, no erythema, no edema. No other acute signs of infection. Nails 1-5 on right and 3-5 on left are mycotic but short.    Vascular: Dorsalis Pedis pulse = 1/4 Bilateral,  Posterior Tibial pulse = 0/4 Bilateral,  Capillary Fill Time < 5 seconds  Neurologic: Vibratory and Protective sensation absent bilateral L>R.  Musculosketal: Healed ulceration at plantar aspect of first metatarsal on left foot. No pain with compression to calves bilateral. Amputated left 1st and 2nd toe.   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 due to underlying condition, limited to breakdown of skin ([place])    -  Primary   Healed   Relevant Medications   JANUVIA 50 MG tablet   Amputated toe of left foot ([facility])       Decreased pedal pulses       Foot pain, left         -Examined patient and discussed the progression of the healed ulceration left foot -Applied metatarsal offloading padding and advised patient to continue to do the same  until his custom insoles have been received which he was molded today with Liliane Channel, pedorthist  -Patient to return to office pickup of custom insoles or sooner if problems or issues arise. [name], DPM

## 2018-08-27 NOTE — Progress Notes (Signed)
Ordered today DBS and custom inserts w/ 1st sub met offload as indicated in foam impression.

## 2018-09-04 ENCOUNTER — Other Ambulatory Visit: Payer: Self-pay | Admitting: Sports Medicine

## 2018-09-04 DIAGNOSIS — L97529 Non-pressure chronic ulcer of other part of left foot with unspecified severity: Principal | ICD-10-CM

## 2018-09-04 DIAGNOSIS — E11621 Type 2 diabetes mellitus with foot ulcer: Secondary | ICD-10-CM

## 2018-09-17 ENCOUNTER — Ambulatory Visit (INDEPENDENT_AMBULATORY_CARE_PROVIDER_SITE_OTHER): Payer: Medicare Other | Admitting: Orthotics

## 2018-09-17 DIAGNOSIS — L97521 Non-pressure chronic ulcer of other part of left foot limited to breakdown of skin: Secondary | ICD-10-CM

## 2018-09-17 DIAGNOSIS — E08621 Diabetes mellitus due to underlying condition with foot ulcer: Secondary | ICD-10-CM

## 2018-09-17 DIAGNOSIS — E1142 Type 2 diabetes mellitus with diabetic polyneuropathy: Secondary | ICD-10-CM

## 2018-09-17 DIAGNOSIS — M79672 Pain in left foot: Secondary | ICD-10-CM

## 2018-09-17 DIAGNOSIS — S98132A Complete traumatic amputation of one left lesser toe, initial encounter: Secondary | ICD-10-CM

## 2018-09-17 DIAGNOSIS — E11621 Type 2 diabetes mellitus with foot ulcer: Secondary | ICD-10-CM

## 2018-09-23 NOTE — Progress Notes (Signed)

## 2018-10-29 DIAGNOSIS — E1122 Type 2 diabetes mellitus with diabetic chronic kidney disease: Secondary | ICD-10-CM | POA: Diagnosis not present

## 2018-10-29 DIAGNOSIS — I129 Hypertensive chronic kidney disease with stage 1 through stage 4 chronic kidney disease, or unspecified chronic kidney disease: Secondary | ICD-10-CM | POA: Diagnosis not present

## 2018-10-29 DIAGNOSIS — R809 Proteinuria, unspecified: Secondary | ICD-10-CM | POA: Diagnosis not present

## 2018-10-29 DIAGNOSIS — N183 Chronic kidney disease, stage 3 (moderate): Secondary | ICD-10-CM | POA: Diagnosis not present

## 2018-10-31 ENCOUNTER — Other Ambulatory Visit: Payer: Self-pay | Admitting: Nephrology

## 2018-10-31 ENCOUNTER — Other Ambulatory Visit (HOSPITAL_COMMUNITY): Payer: Self-pay | Admitting: Nephrology

## 2018-10-31 DIAGNOSIS — N521 Erectile dysfunction due to diseases classified elsewhere: Secondary | ICD-10-CM | POA: Diagnosis not present

## 2018-10-31 DIAGNOSIS — N183 Chronic kidney disease, stage 3 unspecified: Secondary | ICD-10-CM

## 2018-10-31 DIAGNOSIS — E782 Mixed hyperlipidemia: Secondary | ICD-10-CM | POA: Diagnosis not present

## 2018-10-31 DIAGNOSIS — Z8673 Personal history of transient ischemic attack (TIA), and cerebral infarction without residual deficits: Secondary | ICD-10-CM | POA: Diagnosis not present

## 2018-10-31 DIAGNOSIS — Z6825 Body mass index (BMI) 25.0-25.9, adult: Secondary | ICD-10-CM | POA: Diagnosis not present

## 2018-10-31 DIAGNOSIS — I10 Essential (primary) hypertension: Secondary | ICD-10-CM | POA: Diagnosis not present

## 2018-10-31 DIAGNOSIS — Z89422 Acquired absence of other left toe(s): Secondary | ICD-10-CM | POA: Diagnosis not present

## 2018-10-31 DIAGNOSIS — E1140 Type 2 diabetes mellitus with diabetic neuropathy, unspecified: Secondary | ICD-10-CM | POA: Diagnosis not present

## 2018-10-31 DIAGNOSIS — Z720 Tobacco use: Secondary | ICD-10-CM | POA: Diagnosis not present

## 2018-11-06 DIAGNOSIS — H2513 Age-related nuclear cataract, bilateral: Secondary | ICD-10-CM | POA: Diagnosis not present

## 2018-11-06 DIAGNOSIS — H348312 Tributary (branch) retinal vein occlusion, right eye, stable: Secondary | ICD-10-CM | POA: Diagnosis not present

## 2018-11-06 DIAGNOSIS — E119 Type 2 diabetes mellitus without complications: Secondary | ICD-10-CM | POA: Diagnosis not present

## 2018-11-06 DIAGNOSIS — Z794 Long term (current) use of insulin: Secondary | ICD-10-CM | POA: Diagnosis not present

## 2018-11-19 ENCOUNTER — Ambulatory Visit: Payer: Medicare Other | Admitting: Sports Medicine

## 2018-11-20 ENCOUNTER — Ambulatory Visit (HOSPITAL_COMMUNITY)
Admission: RE | Admit: 2018-11-20 | Discharge: 2018-11-20 | Disposition: A | Discharge status: Home / Self Care | Payer: Medicare Other | Source: Ambulatory Visit | Attending: Nephrology | Admitting: Nephrology

## 2018-11-20 DIAGNOSIS — N183 Chronic kidney disease, stage 3 unspecified: Secondary | ICD-10-CM

## 2018-11-20 DIAGNOSIS — N189 Chronic kidney disease, unspecified: Secondary | ICD-10-CM | POA: Diagnosis not present

## 2018-11-26 DIAGNOSIS — R809 Proteinuria, unspecified: Secondary | ICD-10-CM | POA: Diagnosis not present

## 2018-11-26 DIAGNOSIS — I129 Hypertensive chronic kidney disease with stage 1 through stage 4 chronic kidney disease, or unspecified chronic kidney disease: Secondary | ICD-10-CM | POA: Diagnosis not present

## 2018-11-26 DIAGNOSIS — N183 Chronic kidney disease, stage 3 (moderate): Secondary | ICD-10-CM | POA: Diagnosis not present

## 2018-11-26 DIAGNOSIS — E1122 Type 2 diabetes mellitus with diabetic chronic kidney disease: Secondary | ICD-10-CM | POA: Diagnosis not present

## 2018-11-27 DIAGNOSIS — H348312 Tributary (branch) retinal vein occlusion, right eye, stable: Secondary | ICD-10-CM | POA: Diagnosis not present

## 2018-12-24 ENCOUNTER — Ambulatory Visit: Payer: Medicare Other | Admitting: Sports Medicine

## 2019-02-04 ENCOUNTER — Ambulatory Visit: Payer: Medicare Other | Admitting: Sports Medicine

## 2019-05-06 ENCOUNTER — Other Ambulatory Visit: Payer: Self-pay

## 2019-05-06 ENCOUNTER — Encounter: Payer: Self-pay | Admitting: Sports Medicine

## 2019-05-06 ENCOUNTER — Ambulatory Visit (INDEPENDENT_AMBULATORY_CARE_PROVIDER_SITE_OTHER): Payer: Medicare Other | Admitting: Sports Medicine

## 2019-05-06 VITALS — Temp 97.7°F

## 2019-05-06 DIAGNOSIS — S90822A Blister (nonthermal), left foot, initial encounter: Secondary | ICD-10-CM | POA: Diagnosis not present

## 2019-05-06 DIAGNOSIS — E11621 Type 2 diabetes mellitus with foot ulcer: Secondary | ICD-10-CM

## 2019-05-06 DIAGNOSIS — M79675 Pain in left toe(s): Secondary | ICD-10-CM | POA: Diagnosis not present

## 2019-05-06 DIAGNOSIS — B351 Tinea unguium: Secondary | ICD-10-CM | POA: Diagnosis not present

## 2019-05-06 DIAGNOSIS — S98132A Complete traumatic amputation of one left lesser toe, initial encounter: Secondary | ICD-10-CM

## 2019-05-06 DIAGNOSIS — L84 Corns and callosities: Secondary | ICD-10-CM

## 2019-05-06 DIAGNOSIS — L97521 Non-pressure chronic ulcer of other part of left foot limited to breakdown of skin: Secondary | ICD-10-CM | POA: Diagnosis not present

## 2019-05-06 DIAGNOSIS — M79674 Pain in right toe(s): Secondary | ICD-10-CM | POA: Diagnosis not present

## 2019-05-06 DIAGNOSIS — E1142 Type 2 diabetes mellitus with diabetic polyneuropathy: Secondary | ICD-10-CM

## 2019-05-06 NOTE — Progress Notes (Signed)
Subjective: [name] is a 68 y.o. male patient seen in office for follow up evaluation of the healed ulceration of the Left foot and for nail care. Patient has a history of diabetes and a blood glucose level today around 160. Patient reports that the area is doing well with no problems no active drainage and no opening that he can feel.  Denies nausea/fever/vomiting/chills/night sweats/shortness of breath/pain. Patient has no other pedal complaints at this time.  Patient Active Problem List   Diagnosis Date Noted  . History of amputation of lesser toe of left foot ([place]) 08/18/2015  . Hyperlipidemia, mixed 08/18/2015  . Type 2 diabetes mellitus with peripheral neuropathy ([place]) 08/18/2015  . Acute CVA (cerebrovascular accident) ([place]) 08/09/2015  . Acute renal failure superimposed on stage 3 chronic kidney disease ([place]) 08/09/2015  . Essential hypertriglyceridemia 08/09/2015  . Hypertension, essential 08/09/2015  . Right-sided muscle weakness 08/09/2015  . T2DM (type 2 diabetes mellitus) ([place]) 08/09/2015  . Tobacco abuse 08/09/2015  . CKD (chronic kidney disease) 04/17/2013  . DM2 (diabetes mellitus, type 2) ([place]) 04/17/2013  . HTN (hypertension) 04/17/2013  . Osteomyelitis of toe of left foot ([place]) 04/17/2013   Current Outpatient Medications on File Prior to Visit  Medication Sig Dispense Refill  . amitriptyline (ELAVIL) 25 MG tablet     . amLODipine (NORVASC) 10 MG tablet     . amoxicillin-clavulanate (AUGMENTIN) 875-125 MG tablet Take 1 tablet by mouth 2 (two) times daily. 28 tablet 0  . aspirin 81 MG chewable tablet Chew by mouth.    Marland Kitchen atorvastatin (LIPITOR) 80 MG tablet     . BISACODYL PO     . Blood Glucose Monitoring Suppl (TRUE METRIX METER) w/Device KIT   0  . Cholecalciferol (VITAMIN D3) 1000 units CAPS Take by mouth.    . doxycycline (VIBRA-TABS) 100 MG tablet   0  . furosemide (LASIX) 20 MG tablet Take 20 mg by mouth.    Marland Kitchen glipiZIDE (GLUCOTROL) 10 MG tablet   1  .  glucose blood (CONTOUR TEST) test strip Use 2 (two) times daily.    . Insulin Glargine (LANTUS SOLOSTAR) 100 UNIT/ML Solostar Pen Use up to 50 units per day as per MD instructions    . JANUVIA 50 MG tablet   0  . Lancets (ONETOUCH ULTRASOFT) lancets by Does not apply route.    Marland Kitchen levofloxacin (LEVAQUIN) 500 MG tablet Take 1 tablet (500 mg total) by mouth daily. 14 tablet 0  . liraglutide (VICTOZA) 18 MG/3ML SOPN Inject into the skin.    Marland Kitchen losartan (COZAAR) 100 MG tablet Take by mouth.    . losartan (COZAAR) 50 MG tablet     . metFORMIN (GLUCOPHAGE) 500 MG tablet Take by mouth.    . pregabalin (LYRICA) 100 MG capsule Take by mouth.    . traMADol (ULTRAM) 50 MG tablet Take 1 tablet (50 mg total) by mouth every 6 (six) hours as needed. 30 tablet o  . ULTICARE MINI PEN NEEDLES 31G X 6 MM MISC   0  . UNABLE TO FIND Diabetic Supplies:  Glucometer, test strips, lancets. Check glucose BID (fasting and 2 hours after eating). One month supply. Refills PRN.     No current facility-administered medications on file prior to visit.    No Known Allergies  No results found for this or any previous visit (from the past 2160 hour(s)).  Objective: There were no vitals filed for this visit.  General: Patient is awake, alert, oriented x 3  and in no acute distress.  Dermatology: Skin is warm and dry bilateral with a healed ulceration plantar first metatarsal head left foot, + blister to left 5th toe joint, no erythema, no edema. No other acute signs of infection. Nails 1-5 on right and 3-5 on left are mycotic but short.    Vascular: Dorsalis Pedis pulse = 1/4 Bilateral,  Posterior Tibial pulse = 0/4 Bilateral,  Capillary Fill Time < 5 seconds  Neurologic: Vibratory and Protective sensation absent bilateral L>R.  Musculosketal: Healed ulceration at plantar aspect of first metatarsal on left foot. No pain with compression to calves bilateral. Amputated left 1st and 2nd toe.   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, limited to breakdown of skin (HCC)       Relevant Medications   losartan (COZAAR) 50 MG tablet     -Examined patient and discussed the progression of the healed ulceration left foot and new blister on left -Lanced blister and wound culture obtained, will call patient if abx are needed -Applied betadine and bandaid dressing and advised patient to do the same at home daily for 1 week -Mechanically debrided nails x 8 using sterile nail nipper without incident -Continue with shoes with offloading insoles   -Patient to return to office in 2-3 weeks for blister check or sooner if problems or issues arise. [name], DPM

## 2019-05-09 LAB — WOUND CULTURE
MICRO NUMBER:: 736170
SPECIMEN QUALITY:: ADEQUATE

## 2019-05-20 ENCOUNTER — Ambulatory Visit (INDEPENDENT_AMBULATORY_CARE_PROVIDER_SITE_OTHER): Payer: Medicare Other | Admitting: Sports Medicine

## 2019-05-20 ENCOUNTER — Encounter: Payer: Self-pay | Admitting: Sports Medicine

## 2019-05-20 ENCOUNTER — Other Ambulatory Visit: Payer: Self-pay

## 2019-05-20 VITALS — Temp 97.7°F

## 2019-05-20 DIAGNOSIS — S98132A Complete traumatic amputation of one left lesser toe, initial encounter: Secondary | ICD-10-CM

## 2019-05-20 DIAGNOSIS — L84 Corns and callosities: Secondary | ICD-10-CM

## 2019-05-20 DIAGNOSIS — E1142 Type 2 diabetes mellitus with diabetic polyneuropathy: Secondary | ICD-10-CM | POA: Diagnosis not present

## 2019-05-20 DIAGNOSIS — S90822D Blister (nonthermal), left foot, subsequent encounter: Secondary | ICD-10-CM | POA: Diagnosis not present

## 2019-05-20 NOTE — Progress Notes (Signed)
Subjective: [name] is a 68 y.o. male patient seen in office for follow up evaluation of blister on left. Reports that area is healed with no drainage, redness, warmth, or pain. Patient has no other pedal complaints at this time.  Patient Active Problem List   Diagnosis Date Noted  . History of amputation of lesser toe of left foot ([place]) 08/18/2015  . Hyperlipidemia, mixed 08/18/2015  . Type 2 diabetes mellitus with peripheral neuropathy ([place]) 08/18/2015  . Acute CVA (cerebrovascular accident) ([place]) 08/09/2015  . Acute renal failure superimposed on stage 3 chronic kidney disease ([place]) 08/09/2015  . Essential hypertriglyceridemia 08/09/2015  . Hypertension, essential 08/09/2015  . Right-sided muscle weakness 08/09/2015  . T2DM (type 2 diabetes mellitus) ([place]) 08/09/2015  . Tobacco abuse 08/09/2015  . CKD (chronic kidney disease) 04/17/2013  . DM2 (diabetes mellitus, type 2) ([place]) 04/17/2013  . HTN (hypertension) 04/17/2013  . Osteomyelitis of toe of left foot ([place]) 04/17/2013   Current Outpatient Medications on File Prior to Visit  Medication Sig Dispense Refill  . amitriptyline (ELAVIL) 25 MG tablet     . amLODipine (NORVASC) 10 MG tablet     . amoxicillin-clavulanate (AUGMENTIN) 875-125 MG tablet Take 1 tablet by mouth 2 (two) times daily. 28 tablet 0  . aspirin 81 MG chewable tablet Chew by mouth.    Marland Kitchen atorvastatin (LIPITOR) 80 MG tablet     . BISACODYL PO     . Blood Glucose Monitoring Suppl (TRUE METRIX METER) w/Device KIT   0  . Cholecalciferol (VITAMIN D3) 1000 units CAPS Take by mouth.    . doxycycline (VIBRA-TABS) 100 MG tablet   0  . furosemide (LASIX) 20 MG tablet Take 20 mg by mouth.    Marland Kitchen glipiZIDE (GLUCOTROL) 10 MG tablet   1  . glucose blood (CONTOUR TEST) test strip Use 2 (two) times daily.    . Insulin Glargine (LANTUS SOLOSTAR) 100 UNIT/ML Solostar Pen Use up to 50 units per day as per MD instructions    . JANUVIA 50 MG tablet   0  . Lancets (ONETOUCH  ULTRASOFT) lancets by Does not apply route.    Marland Kitchen levofloxacin (LEVAQUIN) 500 MG tablet Take 1 tablet (500 mg total) by mouth daily. 14 tablet 0  . liraglutide (VICTOZA) 18 MG/3ML SOPN Inject into the skin.    Marland Kitchen losartan (COZAAR) 100 MG tablet Take by mouth.    . losartan (COZAAR) 50 MG tablet     . pregabalin (LYRICA) 100 MG capsule Take by mouth.    . traMADol (ULTRAM) 50 MG tablet Take 1 tablet (50 mg total) by mouth every 6 (six) hours as needed. 30 tablet o  . ULTICARE MINI PEN NEEDLES 31G X 6 MM MISC   0  . UNABLE TO FIND Diabetic Supplies:  Glucometer, test strips, lancets. Check glucose BID (fasting and 2 hours after eating). One month supply. Refills PRN.    . metFORMIN (GLUCOPHAGE) 500 MG tablet Take by mouth.     No current facility-administered medications on file prior to visit.    No Known Allergies  Recent Results (from the past 2160 hour(s))  WOUND CULTURE     Status: None   Collection Time: 05/06/19  9:26 AM   Specimen: Ulcer; Wound  Result Value Ref Range   MICRO NUMBER: [id number]    SPECIMEN QUALITY: Adequate    SOURCE: WOUND    STATUS: FINAL    GRAM STAIN:      No organisms or white blood  cells seen No epithelial cells seen   RESULT:      Growth of skin flora (note: Growth does not include S. aureus, beta-hemolytic Streptococci or P. aeruginosa).    Objective: There were no vitals filed for this visit.  General: Patient is awake, alert, oriented x 3 and in no acute distress.  Dermatology: Skin is warm and dry bilateral with a healed blister at left plantar, no erythema, no edema. No other acute signs of infection. Nails 1-5 on right and 3-5 on left are mycotic but short trimmed 2 weeks ago.   Vascular: Dorsalis Pedis pulse = 1/4 Bilateral,  Posterior Tibial pulse = 0/4 Bilateral,  Capillary Fill Time < 5 seconds  Neurologic: Vibratory and Protective sensation absent bilateral L>R.  Musculosketal: Healed left foot blister. No pain with compression to calves  bilateral. Amputated left 1st and 2nd toe.   Assessment and Plan:  Problem List Items Addressed This Visit      Endocrine   Type 2 diabetes mellitus with peripheral neuropathy ([place])    Other Visit Diagnoses    Blister of left foot, subsequent encounter    -  Primary   Callus of foot       Amputated toe of left foot ([place])         -Examined patient -Blister welled healed  -Continue with shoes with offloading insoles   -Patient to return to office in 10 weeks for routine foot care  [name], DPM

## 2019-05-28 DIAGNOSIS — N521 Erectile dysfunction due to diseases classified elsewhere: Secondary | ICD-10-CM | POA: Diagnosis not present

## 2019-05-28 DIAGNOSIS — E1140 Type 2 diabetes mellitus with diabetic neuropathy, unspecified: Secondary | ICD-10-CM | POA: Diagnosis not present

## 2019-05-28 DIAGNOSIS — Z6825 Body mass index (BMI) 25.0-25.9, adult: Secondary | ICD-10-CM | POA: Diagnosis not present

## 2019-05-28 DIAGNOSIS — Z89422 Acquired absence of other left toe(s): Secondary | ICD-10-CM | POA: Diagnosis not present

## 2019-05-28 DIAGNOSIS — E782 Mixed hyperlipidemia: Secondary | ICD-10-CM | POA: Diagnosis not present

## 2019-05-28 DIAGNOSIS — I10 Essential (primary) hypertension: Secondary | ICD-10-CM | POA: Diagnosis not present

## 2019-05-28 DIAGNOSIS — N183 Chronic kidney disease, stage 3 (moderate): Secondary | ICD-10-CM | POA: Diagnosis not present

## 2019-05-28 DIAGNOSIS — Z1159 Encounter for screening for other viral diseases: Secondary | ICD-10-CM | POA: Diagnosis not present

## 2019-05-28 DIAGNOSIS — R5383 Other fatigue: Secondary | ICD-10-CM | POA: Diagnosis not present

## 2019-05-28 DIAGNOSIS — Z8673 Personal history of transient ischemic attack (TIA), and cerebral infarction without residual deficits: Secondary | ICD-10-CM | POA: Diagnosis not present

## 2019-05-28 DIAGNOSIS — Z720 Tobacco use: Secondary | ICD-10-CM | POA: Diagnosis not present

## 2019-06-03 DIAGNOSIS — I10 Essential (primary) hypertension: Secondary | ICD-10-CM | POA: Diagnosis not present

## 2019-06-03 DIAGNOSIS — E1140 Type 2 diabetes mellitus with diabetic neuropathy, unspecified: Secondary | ICD-10-CM | POA: Diagnosis not present

## 2019-06-03 DIAGNOSIS — N183 Chronic kidney disease, stage 3 (moderate): Secondary | ICD-10-CM | POA: Diagnosis not present

## 2019-06-03 DIAGNOSIS — E782 Mixed hyperlipidemia: Secondary | ICD-10-CM | POA: Diagnosis not present

## 2019-06-03 DIAGNOSIS — R5383 Other fatigue: Secondary | ICD-10-CM | POA: Diagnosis not present

## 2019-06-13 DIAGNOSIS — N184 Chronic kidney disease, stage 4 (severe): Secondary | ICD-10-CM | POA: Diagnosis not present

## 2019-06-17 DIAGNOSIS — I129 Hypertensive chronic kidney disease with stage 1 through stage 4 chronic kidney disease, or unspecified chronic kidney disease: Secondary | ICD-10-CM | POA: Insufficient documentation

## 2019-06-17 DIAGNOSIS — R809 Proteinuria, unspecified: Secondary | ICD-10-CM | POA: Diagnosis not present

## 2019-06-17 DIAGNOSIS — E1122 Type 2 diabetes mellitus with diabetic chronic kidney disease: Secondary | ICD-10-CM | POA: Diagnosis not present

## 2019-06-17 DIAGNOSIS — N1832 Chronic kidney disease, stage 3b: Secondary | ICD-10-CM | POA: Insufficient documentation

## 2019-06-17 DIAGNOSIS — N183 Chronic kidney disease, stage 3 (moderate): Secondary | ICD-10-CM | POA: Diagnosis not present

## 2019-07-02 DIAGNOSIS — Z23 Encounter for immunization: Secondary | ICD-10-CM | POA: Diagnosis not present

## 2019-07-02 DIAGNOSIS — Z1159 Encounter for screening for other viral diseases: Secondary | ICD-10-CM | POA: Diagnosis not present

## 2019-07-02 DIAGNOSIS — E782 Mixed hyperlipidemia: Secondary | ICD-10-CM | POA: Diagnosis not present

## 2019-07-02 DIAGNOSIS — Z6825 Body mass index (BMI) 25.0-25.9, adult: Secondary | ICD-10-CM | POA: Diagnosis not present

## 2019-07-02 DIAGNOSIS — Z720 Tobacco use: Secondary | ICD-10-CM | POA: Diagnosis not present

## 2019-07-02 DIAGNOSIS — E1140 Type 2 diabetes mellitus with diabetic neuropathy, unspecified: Secondary | ICD-10-CM | POA: Diagnosis not present

## 2019-07-02 DIAGNOSIS — I10 Essential (primary) hypertension: Secondary | ICD-10-CM | POA: Diagnosis not present

## 2019-07-02 DIAGNOSIS — N184 Chronic kidney disease, stage 4 (severe): Secondary | ICD-10-CM | POA: Diagnosis not present

## 2019-07-02 DIAGNOSIS — N521 Erectile dysfunction due to diseases classified elsewhere: Secondary | ICD-10-CM | POA: Diagnosis not present

## 2019-07-02 DIAGNOSIS — Z89422 Acquired absence of other left toe(s): Secondary | ICD-10-CM | POA: Diagnosis not present

## 2019-07-02 DIAGNOSIS — Z8673 Personal history of transient ischemic attack (TIA), and cerebral infarction without residual deficits: Secondary | ICD-10-CM | POA: Diagnosis not present

## 2019-07-15 ENCOUNTER — Ambulatory Visit (INDEPENDENT_AMBULATORY_CARE_PROVIDER_SITE_OTHER): Payer: Medicare Other | Admitting: Sports Medicine

## 2019-07-15 ENCOUNTER — Other Ambulatory Visit: Payer: Self-pay

## 2019-07-15 ENCOUNTER — Ambulatory Visit (INDEPENDENT_AMBULATORY_CARE_PROVIDER_SITE_OTHER): Payer: Medicare Other

## 2019-07-15 ENCOUNTER — Other Ambulatory Visit: Payer: Self-pay | Admitting: Sports Medicine

## 2019-07-15 ENCOUNTER — Encounter: Payer: Self-pay | Admitting: Sports Medicine

## 2019-07-15 VITALS — Temp 97.5°F

## 2019-07-15 DIAGNOSIS — E08621 Diabetes mellitus due to underlying condition with foot ulcer: Secondary | ICD-10-CM

## 2019-07-15 DIAGNOSIS — S90822D Blister (nonthermal), left foot, subsequent encounter: Secondary | ICD-10-CM

## 2019-07-15 DIAGNOSIS — L97521 Non-pressure chronic ulcer of other part of left foot limited to breakdown of skin: Secondary | ICD-10-CM

## 2019-07-15 DIAGNOSIS — M79672 Pain in left foot: Secondary | ICD-10-CM

## 2019-07-15 DIAGNOSIS — E1142 Type 2 diabetes mellitus with diabetic polyneuropathy: Secondary | ICD-10-CM

## 2019-07-15 NOTE — Progress Notes (Signed)
Subjective: [name] is a 68 y.o. male patient seen in office for follow up evaluation of the blister and now ulceration of the Left foot that started again 3 weeks ago. Patient has a history of diabetes and a blood glucose level today not recorded.  Patient reports that the wound was a blister but now has ulcerated. Denies nausea/fever/vomiting/chills/night sweats/shortness of breath/pain. Patient has no other pedal complaints at this time.  Patient Active Problem List   Diagnosis Date Noted  . Benign hypertensive kidney disease with chronic kidney disease 06/17/2019  . Proteinuria 06/17/2019  . History of amputation of lesser toe of left foot ([place]) 08/18/2015  . Hyperlipidemia, mixed 08/18/2015  . Type 2 diabetes mellitus with peripheral neuropathy ([place]) 08/18/2015  . Acute CVA (cerebrovascular accident) ([place]) 08/09/2015  . Acute renal failure superimposed on stage 3 chronic kidney disease ([place]) 08/09/2015  . Essential hypertriglyceridemia 08/09/2015  . Hypertension, essential 08/09/2015  . Right-sided muscle weakness 08/09/2015  . T2DM (type 2 diabetes mellitus) ([place]) 08/09/2015  . Tobacco abuse 08/09/2015  . CKD (chronic kidney disease) 04/17/2013  . DM2 (diabetes mellitus, type 2) ([place]) 04/17/2013  . HTN (hypertension) 04/17/2013  . Osteomyelitis of toe of left foot ([place]) 04/17/2013   Current Outpatient Medications on File Prior to Visit  Medication Sig Dispense Refill  . amitriptyline (ELAVIL) 25 MG tablet     . amLODipine (NORVASC) 10 MG tablet     . amoxicillin-clavulanate (AUGMENTIN) 875-125 MG tablet Take 1 tablet by mouth 2 (two) times daily. 28 tablet 0  . aspirin 81 MG chewable tablet Chew by mouth.    Marland Kitchen atorvastatin (LIPITOR) 80 MG tablet     . BISACODYL PO     . Blood Glucose Monitoring Suppl (TRUE METRIX METER) w/Device KIT   0  . Cholecalciferol (VITAMIN D3) 1000 units CAPS Take by mouth.    . doxycycline (VIBRA-TABS) 100 MG tablet   0  . furosemide (LASIX) 20  MG tablet Take 20 mg by mouth.    Marland Kitchen glipiZIDE (GLUCOTROL) 10 MG tablet   1  . glucose blood (CONTOUR TEST) test strip Use 2 (two) times daily.    . Insulin Glargine (LANTUS SOLOSTAR) 100 UNIT/ML Solostar Pen Use up to 50 units per day as per MD instructions    . JANUVIA 50 MG tablet   0  . Lancets (ONETOUCH ULTRASOFT) lancets by Does not apply route.    Marland Kitchen levofloxacin (LEVAQUIN) 500 MG tablet Take 1 tablet (500 mg total) by mouth daily. 14 tablet 0  . liraglutide (VICTOZA) 18 MG/3ML SOPN Inject into the skin.    Marland Kitchen losartan (COZAAR) 100 MG tablet Take by mouth.    . losartan (COZAAR) 50 MG tablet     . pregabalin (LYRICA) 100 MG capsule Take by mouth.    . tadalafil (CIALIS) 20 MG tablet     . traMADol (ULTRAM) 50 MG tablet Take 1 tablet (50 mg total) by mouth every 6 (six) hours as needed. 30 tablet o  . ULTICARE MINI PEN NEEDLES 31G X 6 MM MISC   0  . UNABLE TO FIND Diabetic Supplies:  Glucometer, test strips, lancets. Check glucose BID (fasting and 2 hours after eating). One month supply. Refills PRN.    . metFORMIN (GLUCOPHAGE) 500 MG tablet Take by mouth.     No current facility-administered medications on file prior to visit.    No Known Allergies  Recent Results (from the past 2160 hour(s))  WOUND CULTURE  Status: None   Collection Time: 05/06/19  9:26 AM   Specimen: Ulcer; Wound  Result Value Ref Range   MICRO NUMBER: [id number]    SPECIMEN QUALITY: Adequate    SOURCE: WOUND    STATUS: FINAL    GRAM STAIN:      No organisms or white blood cells seen No epithelial cells seen   RESULT:      Growth of skin flora (note: Growth does not include S. aureus, beta-hemolytic Streptococci or P. aeruginosa).    Objective: There were no vitals filed for this visit.  General: Patient is awake, alert, oriented x 3 and in no acute distress.  Dermatology: Skin is warm and dry bilateral with a  healed ulceration Left sub met 1 wound with granular tissue that has a skin island the wound  itself at the medial aspect measures 0.3 x 0.4 cm and the wound at the lateral aspect measures 1 x 0.5 x 0.2 cm much improved since last visit with mild reactive keratosis and mild maceration, there is no malodor, no active drainage, no erythema, no edema. No other acute signs of infection. Nails 1-5 on right and 3-5 on left are mycotic but short.    Vascular: Dorsalis Pedis pulse = 1/4 Bilateral,  Posterior Tibial pulse = 0/4 Bilateral,  Capillary Fill Time < 5 seconds  Neurologic: Vibratory and Protective sensation absent bilateral L>R.  Musculosketal: No pain with palpation to ulcerated area plantar aspect of first metatarsal on left foot. No pain with compression to calves bilateral. Amputated left 1st and 2nd toe.   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 -Continue with Augmentin -Excisionally dedbrided ulceration at plantar 3rd metatarsal on left to healthy bleeding borders removing nonviable tissue using a sterile chisel blade. Wound measures post debridement as above. Wound was debrided to the level of the dermis and subcutaneous tissue with viable wound base exposed to promote healing. Hemostasis was achieved with manuel pressure. Patient tolerated procedure well without any discomfort or anesthesia necessary for this wound debridement.  -Wound culture obtained will call patient if there is a need for PO antibiotics  -Applied Iodosorb and dry sterile dressing and  instructed patient to continue with daily dressings at home consisting of the same as given. - Advised patient to go to the ER or return to office if the wound worsens or if constitutional symptoms are present. -Patient to return to office in 2-3 weeks or sooner if problems arise.   [name], DPM

## 2019-07-18 ENCOUNTER — Other Ambulatory Visit: Payer: Self-pay | Admitting: Sports Medicine

## 2019-07-18 LAB — WOUND CULTURE
MICRO NUMBER:: 984608
SPECIMEN QUALITY:: ADEQUATE

## 2019-07-18 MED ORDER — AMOXICILLIN-POT CLAVULANATE 875-125 MG PO TABS: 1.0000 | ORAL_TABLET | Freq: Two times a day (BID) | ORAL | 0 refills | Status: DC

## 2019-07-18 NOTE — Progress Notes (Signed)
Wound culture + sent Augmentin to pharmacy -Dr. Cannon Kettle

## 2019-07-23 ENCOUNTER — Telehealth: Payer: Self-pay

## 2019-07-23 NOTE — Telephone Encounter (Signed)
LVM to Pt stating his wound cx results and about his prescription sent to his pharmacy

## 2019-07-23 NOTE — Telephone Encounter (Signed)
-----   Message from [name], [place] sent at 07/18/2019  6:55 PM EDT ----- Will you let patient know that his Wound culture was + sent Augmentin to pharmacy Thanks Dr. Chauncey Cruel

## 2019-07-29 ENCOUNTER — Ambulatory Visit: Payer: Medicare Other | Admitting: Podiatry

## 2019-07-29 ENCOUNTER — Ambulatory Visit (INDEPENDENT_AMBULATORY_CARE_PROVIDER_SITE_OTHER): Payer: Medicare Other | Admitting: Sports Medicine

## 2019-07-29 ENCOUNTER — Other Ambulatory Visit: Payer: Self-pay

## 2019-07-29 ENCOUNTER — Encounter: Payer: Self-pay | Admitting: Sports Medicine

## 2019-07-29 DIAGNOSIS — L97521 Non-pressure chronic ulcer of other part of left foot limited to breakdown of skin: Secondary | ICD-10-CM | POA: Diagnosis not present

## 2019-07-29 DIAGNOSIS — E08621 Diabetes mellitus due to underlying condition with foot ulcer: Secondary | ICD-10-CM | POA: Diagnosis not present

## 2019-07-29 DIAGNOSIS — M79672 Pain in left foot: Secondary | ICD-10-CM

## 2019-07-29 DIAGNOSIS — E1142 Type 2 diabetes mellitus with diabetic polyneuropathy: Secondary | ICD-10-CM

## 2019-07-29 NOTE — Progress Notes (Signed)
Subjective: [name] is a 68 y.o. male patient seen in office for follow up evaluation ulcer left foot.  Patient reports that it is doing better. Denies nausea/fever/vomiting/chills/night sweats/shortness of breath/pain. FBS 200. Patient has no other pedal complaints at this time.  Patient Active Problem List   Diagnosis Date Noted  . Benign hypertensive kidney disease with chronic kidney disease 06/17/2019  . Proteinuria 06/17/2019  . History of amputation of lesser toe of left foot ([place]) 08/18/2015  . Hyperlipidemia, mixed 08/18/2015  . Type 2 diabetes mellitus with peripheral neuropathy ([place]) 08/18/2015  . Acute CVA (cerebrovascular accident) ([place]) 08/09/2015  . Acute renal failure superimposed on stage 3 chronic kidney disease ([place]) 08/09/2015  . Essential hypertriglyceridemia 08/09/2015  . Hypertension, essential 08/09/2015  . Right-sided muscle weakness 08/09/2015  . T2DM (type 2 diabetes mellitus) ([place]) 08/09/2015  . Tobacco abuse 08/09/2015  . CKD (chronic kidney disease) 04/17/2013  . DM2 (diabetes mellitus, type 2) ([place]) 04/17/2013  . HTN (hypertension) 04/17/2013  . Osteomyelitis of toe of left foot ([place]) 04/17/2013   Current Outpatient Medications on File Prior to Visit  Medication Sig Dispense Refill  . amitriptyline (ELAVIL) 25 MG tablet     . amLODipine (NORVASC) 10 MG tablet     . amoxicillin-clavulanate (AUGMENTIN) 875-125 MG tablet Take 1 tablet by mouth 2 (two) times daily. 28 tablet 0  . aspirin 81 MG chewable tablet Chew by mouth.    Marland Kitchen atorvastatin (LIPITOR) 80 MG tablet     . BISACODYL PO     . Blood Glucose Monitoring Suppl (TRUE METRIX METER) w/Device KIT   0  . Cholecalciferol (VITAMIN D3) 1000 units CAPS Take by mouth.    . doxycycline (VIBRA-TABS) 100 MG tablet   0  . furosemide (LASIX) 20 MG tablet Take 20 mg by mouth.    Marland Kitchen glipiZIDE (GLUCOTROL) 10 MG tablet   1  . glucose blood (CONTOUR TEST) test strip Use 2 (two) times daily.    . Insulin  Glargine (LANTUS SOLOSTAR) 100 UNIT/ML Solostar Pen Use up to 50 units per day as per MD instructions    . JANUVIA 50 MG tablet   0  . Lancets (ONETOUCH ULTRASOFT) lancets by Does not apply route.    Marland Kitchen levofloxacin (LEVAQUIN) 500 MG tablet Take 1 tablet (500 mg total) by mouth daily. 14 tablet 0  . liraglutide (VICTOZA) 18 MG/3ML SOPN Inject into the skin.    Marland Kitchen losartan (COZAAR) 100 MG tablet Take by mouth.    . losartan (COZAAR) 50 MG tablet     . metFORMIN (GLUCOPHAGE) 500 MG tablet Take by mouth.    . pregabalin (LYRICA) 100 MG capsule Take by mouth.    . tadalafil (CIALIS) 20 MG tablet     . traMADol (ULTRAM) 50 MG tablet Take 1 tablet (50 mg total) by mouth every 6 (six) hours as needed. 30 tablet o  . ULTICARE MINI PEN NEEDLES 31G X 6 MM MISC   0  . UNABLE TO FIND Diabetic Supplies:  Glucometer, test strips, lancets. Check glucose BID (fasting and 2 hours after eating). One month supply. Refills PRN.     No current facility-administered medications on file prior to visit.    No Known Allergies  Recent Results (from the past 2160 hour(s))  WOUND CULTURE     Status: None   Collection Time: 05/06/19  9:26 AM   Specimen: Ulcer; Wound  Result Value Ref Range   MICRO NUMBER: [id number]    SPECIMEN  QUALITY: Adequate    SOURCE: WOUND    STATUS: FINAL    GRAM STAIN:      No organisms or white blood cells seen No epithelial cells seen   RESULT:      Growth of skin flora (note: Growth does not include S. aureus, beta-hemolytic Streptococci or P. aeruginosa).  WOUND CULTURE     Status: Abnormal   Collection Time: 07/15/19 10:50 AM   Specimen: Wound  Result Value Ref Range   MICRO NUMBER: [id number]    SPECIMEN QUALITY: Adequate    SOURCE: LEFT FOOT    STATUS: FINAL    GRAM STAIN: Gram positive cocci in chains     Comment: No white blood cells seen Moderate Gram negative bacilli Moderate Gram positive cocci in pairs Rare Gram positive cocci in chains Few Gram positive bacilli   ISOLATE  1: Citrobacter koseri (A)     Comment: Heavy growth of Citrobacter koseri   ISOLATE 2: Group G Streptococcus (A)     Comment: Heavy growth of Group G Streptococcus Beta-hemolytic streptococci are predictably susceptible to Penicillin and other beta-lactams. Susceptibility testing not routinely performed. Please contact the laboratory within 3 days if susceptibility testing is  desired.       Susceptibility   Citrobacter koseri - AEROBIC CULT, GRAM STAIN NEGATIVE 1    AMOX/CLAVULANIC 8 Sensitive     CEFAZOLIN* <=4 Not Reportable      * For infections other than uncomplicated UTIcaused by E. coli, K. pneumoniae or P. mirabilis:Cefazolin is resistant if MIC > or = 8 mcg/mL.(Distinguishing susceptible versus intermediatefor isolates with MIC < or = 4 mcg/mL requiresadditional testing.)    CEFEPIME <=1 Sensitive     CEFTRIAXONE <=1 Sensitive     CIPROFLOXACIN <=0.25 Sensitive     LEVOFLOXACIN <=0.12 Sensitive     ERTAPENEM <=0.5 Sensitive     GENTAMICIN <=1 Sensitive     IMIPENEM <=0.25 Sensitive     PIP/TAZO 16 Sensitive     TOBRAMYCIN <=1 Sensitive     TRIMETH/SULFA* <=20 Sensitive      * For infections other than uncomplicated UTIcaused by E. coli, K. pneumoniae or P. mirabilis:Cefazolin is resistant if MIC > or = 8 mcg/mL.(Distinguishing susceptible versus intermediatefor isolates with MIC < or = 4 mcg/mL requiresadditional testing.)Legend:S = Susceptible  I = IntermediateR = Resistant  NS = Not susceptible* = Not tested  NR = Not reported**NN = See antimicrobic comments    Objective: There were no vitals filed for this visit.  General: Patient is awake, alert, oriented x 3 and in no acute distress.  Dermatology: Skin is warm and dry bilateral with a  healed ulceration Left sub met 3 measures 1.5x1.5cm with granular base improved since last visit with mild reactive keratosis and mild maceration, there is no malodor, no active drainage, no erythema, no edema. No other acute signs of  infection. Nails 1-5 on right and 3-5 on left are mycotic but short.    Vascular: Dorsalis Pedis pulse = 1/4 Bilateral,  Posterior Tibial pulse = 0/4 Bilateral,  Capillary Fill Time < 5 seconds  Neurologic: Vibratory and Protective sensation absent bilateral L>R.  Musculosketal: No pain with palpation to ulcerated area plantar aspect on left foot. No pain with compression to calves bilateral. Amputated left 1st and 2nd toe.   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 -Continue with Augmentin -Excisionally dedbrided ulceration at plantar 3rd metatarsal on left to healthy bleeding borders removing nonviable tissue using a sterile chisel blade. Wound measures post debridement as above. Wound was debrided to the level of the dermis and subcutaneous tissue with viable wound base exposed to promote healing. Hemostasis was achieved with manuel pressure. Patient tolerated procedure well without any discomfort or anesthesia necessary for this wound debridement.  -Applied Iodosorb and dry sterile dressing and instructed patient to continue with daily dressings at home consisting of the same as given. - Advised patient to go to the ER or return to office if the wound worsens or if constitutional symptoms are present. -Patient to return to office in 2-3 weeks or sooner if problems arise.   [name], DPM

## 2019-07-31 DIAGNOSIS — Z720 Tobacco use: Secondary | ICD-10-CM | POA: Diagnosis not present

## 2019-07-31 DIAGNOSIS — E782 Mixed hyperlipidemia: Secondary | ICD-10-CM | POA: Diagnosis not present

## 2019-07-31 DIAGNOSIS — Z89422 Acquired absence of other left toe(s): Secondary | ICD-10-CM | POA: Diagnosis not present

## 2019-07-31 DIAGNOSIS — Z8673 Personal history of transient ischemic attack (TIA), and cerebral infarction without residual deficits: Secondary | ICD-10-CM | POA: Diagnosis not present

## 2019-07-31 DIAGNOSIS — E1140 Type 2 diabetes mellitus with diabetic neuropathy, unspecified: Secondary | ICD-10-CM | POA: Diagnosis not present

## 2019-07-31 DIAGNOSIS — I10 Essential (primary) hypertension: Secondary | ICD-10-CM | POA: Diagnosis not present

## 2019-07-31 DIAGNOSIS — N521 Erectile dysfunction due to diseases classified elsewhere: Secondary | ICD-10-CM | POA: Diagnosis not present

## 2019-07-31 DIAGNOSIS — N184 Chronic kidney disease, stage 4 (severe): Secondary | ICD-10-CM | POA: Diagnosis not present

## 2019-07-31 DIAGNOSIS — Z6825 Body mass index (BMI) 25.0-25.9, adult: Secondary | ICD-10-CM | POA: Diagnosis not present

## 2019-08-19 ENCOUNTER — Ambulatory Visit (INDEPENDENT_AMBULATORY_CARE_PROVIDER_SITE_OTHER): Payer: Medicare Other | Admitting: Sports Medicine

## 2019-08-19 ENCOUNTER — Other Ambulatory Visit: Payer: Self-pay

## 2019-08-19 ENCOUNTER — Encounter: Payer: Self-pay | Admitting: Sports Medicine

## 2019-08-19 DIAGNOSIS — M79672 Pain in left foot: Secondary | ICD-10-CM

## 2019-08-19 DIAGNOSIS — E08621 Diabetes mellitus due to underlying condition with foot ulcer: Secondary | ICD-10-CM

## 2019-08-19 DIAGNOSIS — E1142 Type 2 diabetes mellitus with diabetic polyneuropathy: Secondary | ICD-10-CM

## 2019-08-19 DIAGNOSIS — L97521 Non-pressure chronic ulcer of other part of left foot limited to breakdown of skin: Secondary | ICD-10-CM | POA: Diagnosis not present

## 2019-08-19 NOTE — Progress Notes (Signed)
Subjective: [name] is a 68 y.o. male patient seen in office for follow up evaluation ulcer left foot.  Patient reports that it is doing ok, no pain or drainage. Denies nausea/fever/vomiting/chills/night sweats/shortness of breath/pain. FBS range 120-130 on insulin. Patient has no other pedal complaints at this time.  Patient Active Problem List   Diagnosis Date Noted  . Benign hypertensive kidney disease with chronic kidney disease 06/17/2019  . Proteinuria 06/17/2019  . History of amputation of lesser toe of left foot ([place]) 08/18/2015  . Hyperlipidemia, mixed 08/18/2015  . Type 2 diabetes mellitus with peripheral neuropathy ([place]) 08/18/2015  . Acute CVA (cerebrovascular accident) ([place]) 08/09/2015  . Acute renal failure superimposed on stage 3 chronic kidney disease ([place]) 08/09/2015  . Essential hypertriglyceridemia 08/09/2015  . Hypertension, essential 08/09/2015  . Right-sided muscle weakness 08/09/2015  . T2DM (type 2 diabetes mellitus) ([place]) 08/09/2015  . Tobacco abuse 08/09/2015  . CKD (chronic kidney disease) 04/17/2013  . DM2 (diabetes mellitus, type 2) ([place]) 04/17/2013  . HTN (hypertension) 04/17/2013  . Osteomyelitis of toe of left foot ([place]) 04/17/2013   Current Outpatient Medications on File Prior to Visit  Medication Sig Dispense Refill  . amitriptyline (ELAVIL) 25 MG tablet     . amLODipine (NORVASC) 10 MG tablet     . aspirin 81 MG chewable tablet Chew by mouth.    Marland Kitchen atorvastatin (LIPITOR) 80 MG tablet     . BISACODYL PO     . Blood Glucose Monitoring Suppl (TRUE METRIX METER) w/Device KIT   0  . Cholecalciferol (VITAMIN D3) 1000 units CAPS Take by mouth.    . furosemide (LASIX) 20 MG tablet Take 20 mg by mouth.    Marland Kitchen glipiZIDE (GLUCOTROL) 10 MG tablet   1  . glucose blood (CONTOUR TEST) test strip Use 2 (two) times daily.    . Insulin Glargine (LANTUS SOLOSTAR) 100 UNIT/ML Solostar Pen Use up to 50 units per day as per MD instructions    . JANUVIA 50 MG tablet    0  . Lancets (ONETOUCH ULTRASOFT) lancets by Does not apply route.    Marland Kitchen levofloxacin (LEVAQUIN) 500 MG tablet Take 1 tablet (500 mg total) by mouth daily. 14 tablet 0  . liraglutide (VICTOZA) 18 MG/3ML SOPN Inject into the skin.    Marland Kitchen losartan (COZAAR) 100 MG tablet Take by mouth.    . losartan (COZAAR) 50 MG tablet     . pregabalin (LYRICA) 100 MG capsule Take by mouth.    . tadalafil (CIALIS) 20 MG tablet     . traMADol (ULTRAM) 50 MG tablet Take 1 tablet (50 mg total) by mouth every 6 (six) hours as needed. 30 tablet o  . ULTICARE MINI PEN NEEDLES 31G X 6 MM MISC   0  . UNABLE TO FIND Diabetic Supplies:  Glucometer, test strips, lancets. Check glucose BID (fasting and 2 hours after eating). One month supply. Refills PRN.    . metFORMIN (GLUCOPHAGE) 500 MG tablet Take by mouth.     No current facility-administered medications on file prior to visit.    No Known Allergies  Recent Results (from the past 2160 hour(s))  WOUND CULTURE     Status: Abnormal   Collection Time: 07/15/19 10:50 AM   Specimen: Wound  Result Value Ref Range   MICRO NUMBER: [id number]    SPECIMEN QUALITY: Adequate    SOURCE: LEFT FOOT    STATUS: FINAL    GRAM STAIN: Gram positive cocci in chains  Comment: No white blood cells seen Moderate Gram negative bacilli Moderate Gram positive cocci in pairs Rare Gram positive cocci in chains Few Gram positive bacilli   ISOLATE 1: Citrobacter koseri (A)     Comment: Heavy growth of Citrobacter koseri   ISOLATE 2: Group G Streptococcus (A)     Comment: Heavy growth of Group G Streptococcus Beta-hemolytic streptococci are predictably susceptible to Penicillin and other beta-lactams. Susceptibility testing not routinely performed. Please contact the laboratory within 3 days if susceptibility testing is  desired.       Susceptibility   Citrobacter koseri - AEROBIC CULT, GRAM STAIN NEGATIVE 1    AMOX/CLAVULANIC 8 Sensitive     CEFAZOLIN* <=4 Not Reportable      * For  infections other than uncomplicated UTIcaused by E. coli, K. pneumoniae or P. mirabilis:Cefazolin is resistant if MIC > or = 8 mcg/mL.(Distinguishing susceptible versus intermediatefor isolates with MIC < or = 4 mcg/mL requiresadditional testing.)    CEFEPIME <=1 Sensitive     CEFTRIAXONE <=1 Sensitive     CIPROFLOXACIN <=0.25 Sensitive     LEVOFLOXACIN <=0.12 Sensitive     ERTAPENEM <=0.5 Sensitive     GENTAMICIN <=1 Sensitive     IMIPENEM <=0.25 Sensitive     PIP/TAZO 16 Sensitive     TOBRAMYCIN <=1 Sensitive     TRIMETH/SULFA* <=20 Sensitive      * For infections other than uncomplicated UTIcaused by E. coli, K. pneumoniae or P. mirabilis:Cefazolin is resistant if MIC > or = 8 mcg/mL.(Distinguishing susceptible versus intermediatefor isolates with MIC < or = 4 mcg/mL requiresadditional testing.)Legend:S = Susceptible  I = IntermediateR = Resistant  NS = Not susceptible* = Not tested  NR = Not reported**NN = See antimicrobic comments    Objective: There were no vitals filed for this visit.  General: Patient is awake, alert, oriented x 3 and in no acute distress.  Dermatology: Skin is warm and dry bilateral with a  healed ulceration Left sub met 3 measures 1.0x1.5cm with granular base improved since last visit with moderate reactive keratosis and mild maceration, there is no malodor, no active drainage, no erythema, no edema. No other acute signs of infection. Nails 1-5 on right and 3-5 on left are mycotic but short.    Vascular: Dorsalis Pedis pulse = 1/4 Bilateral,  Posterior Tibial pulse = 0/4 Bilateral,  Capillary Fill Time < 5 seconds  Neurologic: Vibratory and Protective sensation absent bilateral L>R.  Musculosketal: No pain with palpation to ulcerated area plantar aspect on left foot. No pain with compression to calves bilateral. Amputated left 1st and 2nd toe.   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 -Excisionally dedbrided ulceration at plantar 3rd metatarsal on left to healthy bleeding borders removing nonviable tissue using a sterile chisel blade. Wound measures post debridement as above. Wound was debrided to the level of the dermis and subcutaneous tissue with viable wound base exposed to promote healing. Hemostasis was achieved with manuel pressure. Patient tolerated procedure well without any  discomfort or anesthesia necessary for this wound debridement.  -Applied PRISMA and dry sterile dressing and instructed patient to continue with daily dressings at home consisting of the same as given. - Advised patient to go to the ER or return to office if the wound worsens or if constitutional symptoms are present. -Advised patient to consider surgery, removal of met head if wound continues to be a problem. -Patient to return to office in 3 weeks or sooner if problems arise.   [name], DPM

## 2019-09-02 DIAGNOSIS — D638 Anemia in other chronic diseases classified elsewhere: Secondary | ICD-10-CM | POA: Diagnosis not present

## 2019-09-02 DIAGNOSIS — Z1159 Encounter for screening for other viral diseases: Secondary | ICD-10-CM | POA: Diagnosis not present

## 2019-09-02 DIAGNOSIS — E1122 Type 2 diabetes mellitus with diabetic chronic kidney disease: Secondary | ICD-10-CM | POA: Diagnosis not present

## 2019-09-08 DIAGNOSIS — R809 Proteinuria, unspecified: Secondary | ICD-10-CM | POA: Diagnosis not present

## 2019-09-08 DIAGNOSIS — I129 Hypertensive chronic kidney disease with stage 1 through stage 4 chronic kidney disease, or unspecified chronic kidney disease: Secondary | ICD-10-CM | POA: Diagnosis not present

## 2019-09-08 DIAGNOSIS — N184 Chronic kidney disease, stage 4 (severe): Secondary | ICD-10-CM | POA: Diagnosis not present

## 2019-09-08 DIAGNOSIS — E1122 Type 2 diabetes mellitus with diabetic chronic kidney disease: Secondary | ICD-10-CM | POA: Diagnosis not present

## 2019-09-09 ENCOUNTER — Ambulatory Visit (INDEPENDENT_AMBULATORY_CARE_PROVIDER_SITE_OTHER): Payer: Medicare Other | Admitting: Sports Medicine

## 2019-09-09 ENCOUNTER — Encounter: Payer: Self-pay | Admitting: Sports Medicine

## 2019-09-09 ENCOUNTER — Other Ambulatory Visit: Payer: Self-pay

## 2019-09-09 DIAGNOSIS — L97521 Non-pressure chronic ulcer of other part of left foot limited to breakdown of skin: Secondary | ICD-10-CM | POA: Diagnosis not present

## 2019-09-09 DIAGNOSIS — E1142 Type 2 diabetes mellitus with diabetic polyneuropathy: Secondary | ICD-10-CM

## 2019-09-09 DIAGNOSIS — E08621 Diabetes mellitus due to underlying condition with foot ulcer: Secondary | ICD-10-CM | POA: Diagnosis not present

## 2019-09-09 DIAGNOSIS — M79672 Pain in left foot: Secondary | ICD-10-CM

## 2019-09-09 NOTE — Progress Notes (Signed)
Subjective: [name] is a 68 y.o. male patient seen in office for follow up evaluation ulcer left foot.  Patient reports that it is doing better feels like its getting smaller but has pain, tramadol isn't working anymore. Denies nausea/fever/vomiting/chills/night sweats/shortness of breath/pain. FBS 130's on insulin. Patient has no other pedal complaints at this time.  Patient Active Problem List   Diagnosis Date Noted  . Benign hypertensive kidney disease with chronic kidney disease 06/17/2019  . Proteinuria 06/17/2019  . History of amputation of lesser toe of left foot ([place]) 08/18/2015  . Hyperlipidemia, mixed 08/18/2015  . Type 2 diabetes mellitus with peripheral neuropathy ([place]) 08/18/2015  . Acute CVA (cerebrovascular accident) ([place]) 08/09/2015  . Acute renal failure superimposed on stage 3 chronic kidney disease ([place]) 08/09/2015  . Essential hypertriglyceridemia 08/09/2015  . Hypertension, essential 08/09/2015  . Right-sided muscle weakness 08/09/2015  . T2DM (type 2 diabetes mellitus) ([place]) 08/09/2015  . Tobacco abuse 08/09/2015  . CKD (chronic kidney disease) 04/17/2013  . DM2 (diabetes mellitus, type 2) ([place]) 04/17/2013  . HTN (hypertension) 04/17/2013  . Osteomyelitis of toe of left foot ([place]) 04/17/2013   Current Outpatient Medications on File Prior to Visit  Medication Sig Dispense Refill  . amitriptyline (ELAVIL) 25 MG tablet     . amLODipine (NORVASC) 10 MG tablet     . aspirin 81 MG chewable tablet Chew by mouth.    Marland Kitchen atorvastatin (LIPITOR) 80 MG tablet     . BISACODYL PO     . Blood Glucose Monitoring Suppl (TRUE METRIX METER) w/Device KIT   0  . Cholecalciferol (VITAMIN D3) 1000 units CAPS Take by mouth.    . furosemide (LASIX) 20 MG tablet Take 20 mg by mouth.    Marland Kitchen glipiZIDE (GLUCOTROL) 10 MG tablet   1  . glucose blood (CONTOUR TEST) test strip Use 2 (two) times daily.    . insulin aspart (NOVOLOG FLEXPEN) 100 UNIT/ML FlexPen 3 (three) times a day with meals     . Insulin Glargine (LANTUS SOLOSTAR) 100 UNIT/ML Solostar Pen Use up to 50 units per day as per MD instructions    . JANUVIA 50 MG tablet   0  . Lancets (ONETOUCH ULTRASOFT) lancets by Does not apply route.    Marland Kitchen levofloxacin (LEVAQUIN) 500 MG tablet Take 1 tablet (500 mg total) by mouth daily. 14 tablet 0  . liraglutide (VICTOZA) 18 MG/3ML SOPN Inject into the skin.    Marland Kitchen losartan (COZAAR) 100 MG tablet Take by mouth.    . losartan (COZAAR) 50 MG tablet     . pregabalin (LYRICA) 100 MG capsule Take by mouth.    . sodium bicarbonate 650 MG tablet Take by mouth.    . tadalafil (CIALIS) 20 MG tablet     . traMADol (ULTRAM) 50 MG tablet Take 1 tablet (50 mg total) by mouth every 6 (six) hours as needed. 30 tablet o  . ULTICARE MINI PEN NEEDLES 31G X 6 MM MISC   0  . UNABLE TO FIND Diabetic Supplies:  Glucometer, test strips, lancets. Check glucose BID (fasting and 2 hours after eating). One month supply. Refills PRN.    . metFORMIN (GLUCOPHAGE) 500 MG tablet Take by mouth.     No current facility-administered medications on file prior to visit.    No Known Allergies  Recent Results (from the past 2160 hour(s))  WOUND CULTURE     Status: Abnormal   Collection Time: 07/15/19 10:50 AM   Specimen: Wound  Result Value Ref Range   MICRO NUMBER: [id number]    SPECIMEN QUALITY: Adequate    SOURCE: LEFT FOOT    STATUS: FINAL    GRAM STAIN: Gram positive cocci in chains     Comment: No white blood cells seen Moderate Gram negative bacilli Moderate Gram positive cocci in pairs Rare Gram positive cocci in chains Few Gram positive bacilli   ISOLATE 1: Citrobacter koseri (A)     Comment: Heavy growth of Citrobacter koseri   ISOLATE 2: Group G Streptococcus (A)     Comment: Heavy growth of Group G Streptococcus Beta-hemolytic streptococci are predictably susceptible to Penicillin and other beta-lactams. Susceptibility testing not routinely performed. Please contact the laboratory within 3 days if  susceptibility testing is  desired.       Susceptibility   Citrobacter koseri - AEROBIC CULT, GRAM STAIN NEGATIVE 1    AMOX/CLAVULANIC 8 Sensitive     CEFAZOLIN* <=4 Not Reportable      * For infections other than uncomplicated UTIcaused by E. coli, K. pneumoniae or P. mirabilis:Cefazolin is resistant if MIC > or = 8 mcg/mL.(Distinguishing susceptible versus intermediatefor isolates with MIC < or = 4 mcg/mL requiresadditional testing.)    CEFEPIME <=1 Sensitive     CEFTRIAXONE <=1 Sensitive     CIPROFLOXACIN <=0.25 Sensitive     LEVOFLOXACIN <=0.12 Sensitive     ERTAPENEM <=0.5 Sensitive     GENTAMICIN <=1 Sensitive     IMIPENEM <=0.25 Sensitive     PIP/TAZO 16 Sensitive     TOBRAMYCIN <=1 Sensitive     TRIMETH/SULFA* <=20 Sensitive      * For infections other than uncomplicated UTIcaused by E. coli, K. pneumoniae or P. mirabilis:Cefazolin is resistant if MIC > or = 8 mcg/mL.(Distinguishing susceptible versus intermediatefor isolates with MIC < or = 4 mcg/mL requiresadditional testing.)Legend:S = Susceptible  I = IntermediateR = Resistant  NS = Not susceptible* = Not tested  NR = Not reported**NN = See antimicrobic comments    Objective: There were no vitals filed for this visit.  General: Patient is awake, alert, oriented x 3 and in no acute distress.  Dermatology: Skin is warm and dry bilateral with a  healed ulceration Left sub met 3 measures 0.6x1.1cm with granular base improved since last visit with moderate reactive keratosis and mild maceration, there is no malodor, no active drainage, no erythema, no edema. No other acute signs of infection. Nails 1-5 on right and 3-5 on left are mycotic but short.    Vascular: Dorsalis Pedis pulse = 1/4 Bilateral,  Posterior Tibial pulse = 0/4 Bilateral,  Capillary Fill Time < 5 seconds  Neurologic: Vibratory and Protective sensation absent bilateral L>R.  Musculosketal: No pain with palpation to ulcerated area plantar aspect on left foot. No  pain with compression to calves bilateral. Amputated left 1st and 2nd toe.   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 -Excisionally dedbrided ulceration at plantar 3rd metatarsal on left to healthy bleeding borders removing nonviable tissue using a sterile chisel blade. Wound measures post debridement as above. Wound was debrided to the level of the dermis and subcutaneous tissue with viable wound base exposed to promote healing. Hemostasis was achieved with manuel pressure. Patient tolerated procedure well without any discomfort or anesthesia necessary for this wound debridement.  -Applied PRISMA and dry sterile dressing and instructed patient to continue with daily dressings at home consisting of the same as given again this visit. - Advised patient to go to the ER or return to office if the wound worsens or if constitutional symptoms are present. -Advised patient to consider surgery, removal of met head if wound continues to be a problem. -Patient to return to office in 3-4 weeks or sooner if problems arise.   [name], DPM

## 2019-10-07 ENCOUNTER — Other Ambulatory Visit: Payer: Self-pay

## 2019-10-07 ENCOUNTER — Ambulatory Visit: Payer: Medicare HMO | Admitting: Sports Medicine

## 2019-10-07 ENCOUNTER — Encounter: Payer: Self-pay | Admitting: Sports Medicine

## 2019-10-07 DIAGNOSIS — E08621 Diabetes mellitus due to underlying condition with foot ulcer: Secondary | ICD-10-CM | POA: Diagnosis not present

## 2019-10-07 DIAGNOSIS — L97509 Non-pressure chronic ulcer of other part of unspecified foot with unspecified severity: Secondary | ICD-10-CM | POA: Insufficient documentation

## 2019-10-07 DIAGNOSIS — E11621 Type 2 diabetes mellitus with foot ulcer: Secondary | ICD-10-CM | POA: Insufficient documentation

## 2019-10-07 DIAGNOSIS — L97521 Non-pressure chronic ulcer of other part of left foot limited to breakdown of skin: Secondary | ICD-10-CM | POA: Diagnosis not present

## 2019-10-07 DIAGNOSIS — E1142 Type 2 diabetes mellitus with diabetic polyneuropathy: Secondary | ICD-10-CM

## 2019-10-07 DIAGNOSIS — M79672 Pain in left foot: Secondary | ICD-10-CM

## 2019-10-07 NOTE — Progress Notes (Signed)
Subjective: [name] is a 69 y.o. male patient seen in office for follow up evaluation ulcer left foot.  Patient reports that it is doing ok, has pain related to neuropathy and activity 7/10. Denies nausea/fever/vomiting/chills/night sweats/shortness of breath/pain. FBS 130's not recorded today but this is average. Still on insulin. Patient has no other pedal complaints at this time.  Patient Active Problem List   Diagnosis Date Noted  . Diabetic foot ulcer ([place]) 10/07/2019  . Benign hypertensive kidney disease with chronic kidney disease 06/17/2019  . Proteinuria 06/17/2019  . History of amputation of lesser toe of left foot ([place]) 08/18/2015  . Hyperlipidemia, mixed 08/18/2015  . Type 2 diabetes mellitus with peripheral neuropathy ([place]) 08/18/2015  . Acute CVA (cerebrovascular accident) ([place]) 08/09/2015  . Acute renal failure superimposed on stage 3 chronic kidney disease ([place]) 08/09/2015  . Essential hypertriglyceridemia 08/09/2015  . Hypertension, essential 08/09/2015  . Right-sided muscle weakness 08/09/2015  . T2DM (type 2 diabetes mellitus) ([place]) 08/09/2015  . Tobacco abuse 08/09/2015  . CKD (chronic kidney disease) 04/17/2013  . DM2 (diabetes mellitus, type 2) ([place]) 04/17/2013  . HTN (hypertension) 04/17/2013  . Osteomyelitis of toe of left foot ([place]) 04/17/2013   Current Outpatient Medications on File Prior to Visit  Medication Sig Dispense Refill  . amitriptyline (ELAVIL) 25 MG tablet     . amLODipine (NORVASC) 10 MG tablet     . aspirin 81 MG chewable tablet Chew by mouth.    Marland Kitchen atorvastatin (LIPITOR) 80 MG tablet     . BISACODYL PO     . Blood Glucose Monitoring Suppl (TRUE METRIX METER) w/Device KIT   0  . Cholecalciferol (VITAMIN D3) 1000 units CAPS Take by mouth.    . furosemide (LASIX) 20 MG tablet Take 20 mg by mouth.    Marland Kitchen glipiZIDE (GLUCOTROL) 10 MG tablet   1  . glucose blood (CONTOUR TEST) test strip Use 2 (two) times daily.    . insulin aspart (NOVOLOG  FLEXPEN) 100 UNIT/ML FlexPen 3 (three) times a day with meals    . Insulin Glargine (LANTUS SOLOSTAR) 100 UNIT/ML Solostar Pen Use up to 50 units per day as per MD instructions    . JANUVIA 50 MG tablet   0  . Lancets (ONETOUCH ULTRASOFT) lancets by Does not apply route.    Marland Kitchen levofloxacin (LEVAQUIN) 500 MG tablet Take 1 tablet (500 mg total) by mouth daily. 14 tablet 0  . liraglutide (VICTOZA) 18 MG/3ML SOPN Inject into the skin.    Marland Kitchen losartan (COZAAR) 100 MG tablet Take by mouth.    . losartan (COZAAR) 50 MG tablet     . pregabalin (LYRICA) 100 MG capsule Take by mouth.    . sodium bicarbonate 650 MG tablet Take by mouth.    . tadalafil (CIALIS) 20 MG tablet     . traMADol (ULTRAM) 50 MG tablet Take 1 tablet (50 mg total) by mouth every 6 (six) hours as needed. 30 tablet o  . ULTICARE MINI PEN NEEDLES 31G X 6 MM MISC   0  . UNABLE TO FIND Diabetic Supplies:  Glucometer, test strips, lancets. Check glucose BID (fasting and 2 hours after eating). One month supply. Refills PRN.    . metFORMIN (GLUCOPHAGE) 500 MG tablet Take by mouth.     No current facility-administered medications on file prior to visit.   No Known Allergies  Recent Results (from the past 2160 hour(s))  WOUND CULTURE     Status: Abnormal  Collection Time: 07/15/19 10:50 AM   Specimen: Wound  Result Value Ref Range   MICRO NUMBER: [id number]    SPECIMEN QUALITY: Adequate    SOURCE: LEFT FOOT    STATUS: FINAL    GRAM STAIN: Gram positive cocci in chains     Comment: No white blood cells seen Moderate Gram negative bacilli Moderate Gram positive cocci in pairs Rare Gram positive cocci in chains Few Gram positive bacilli   ISOLATE 1: Citrobacter koseri (A)     Comment: Heavy growth of Citrobacter koseri   ISOLATE 2: Group G Streptococcus (A)     Comment: Heavy growth of Group G Streptococcus Beta-hemolytic streptococci are predictably susceptible to Penicillin and other beta-lactams. Susceptibility testing not routinely  performed. Please contact the laboratory within 3 days if susceptibility testing is  desired.       Susceptibility   Citrobacter koseri - AEROBIC CULT, GRAM STAIN NEGATIVE 1    AMOX/CLAVULANIC 8 Sensitive     CEFAZOLIN* <=4 Not Reportable      * For infections other than uncomplicated UTIcaused by E. coli, K. pneumoniae or P. mirabilis:Cefazolin is resistant if MIC > or = 8 mcg/mL.(Distinguishing susceptible versus intermediatefor isolates with MIC < or = 4 mcg/mL requiresadditional testing.)    CEFEPIME <=1 Sensitive     CEFTRIAXONE <=1 Sensitive     CIPROFLOXACIN <=0.25 Sensitive     LEVOFLOXACIN <=0.12 Sensitive     ERTAPENEM <=0.5 Sensitive     GENTAMICIN <=1 Sensitive     IMIPENEM <=0.25 Sensitive     PIP/TAZO 16 Sensitive     TOBRAMYCIN <=1 Sensitive     TRIMETH/SULFA* <=20 Sensitive      * For infections other than uncomplicated UTIcaused by E. coli, K. pneumoniae or P. mirabilis:Cefazolin is resistant if MIC > or = 8 mcg/mL.(Distinguishing susceptible versus intermediatefor isolates with MIC < or = 4 mcg/mL requiresadditional testing.)Legend:S = Susceptible  I = IntermediateR = Resistant  NS = Not susceptible* = Not tested  NR = Not reported**NN = See antimicrobic comments    Objective: There were no vitals filed for this visit.  General: Patient is awake, alert, oriented x 3 and in no acute distress.  Dermatology: Skin is warm and dry bilateral with a  healed ulceration Left sub met 3 measures 0.9x1.0cm larger than previous with granular base improved since last visit with moderate reactive keratosis and mild maceration, there is no malodor, no active drainage, no erythema, no edema. No other acute signs of infection. Nails 1-5 on right and 3-5 on left are mycotic but short.    Vascular: Dorsalis Pedis pulse = 1/4 Bilateral,  Posterior Tibial pulse = 0/4 Bilateral,  Capillary Fill Time < 5 seconds  Neurologic: Vibratory and Protective sensation absent bilateral  L>R.  Musculosketal: No pain with palpation to ulcerated area plantar aspect on left foot. No pain with compression to calves bilateral. Amputated left 1st and 2nd toe.   Assessment and Plan:  Problem List Items Addressed This Visit      Endocrine   Type 2 diabetes mellitus with peripheral neuropathy ([place])   Diabetic foot ulcer ([place]) - Primary    Other Visit Diagnoses    Left foot pain         -Examined patient and discussed the progression of the recurrent wound and treatment alternatives. -Excisionally dedbrided ulceration at plantar 3rd metatarsal on left to healthy bleeding borders removing nonviable tissue using a sterile chisel blade. Wound measures post debridement as above. Wound  was debrided to the level of the dermis and subcutaneous tissue with viable wound base exposed to promote healing. Hemostasis was achieved with manuel pressure. Patient tolerated procedure well without any discomfort or anesthesia necessary for this wound debridement.  -Applied PRISMA and dry sterile dressing and instructed patient to continue with daily dressings at home consisting of the same as given again this visit. -Continue with post op shoe -Advised patient to consider surgery met head removal and application of graft if fails to continue to improve. - Advised patient to go to the ER or return to office if the wound worsens or if constitutional symptoms are present. -Patient to return to office in 3 weeks or sooner if problems arise.   [name], DPM

## 2019-10-28 ENCOUNTER — Encounter: Payer: Self-pay | Admitting: Sports Medicine

## 2019-10-28 ENCOUNTER — Other Ambulatory Visit: Payer: Self-pay

## 2019-10-28 ENCOUNTER — Ambulatory Visit (INDEPENDENT_AMBULATORY_CARE_PROVIDER_SITE_OTHER): Payer: Medicare HMO | Admitting: Sports Medicine

## 2019-10-28 VITALS — Temp 98.0°F

## 2019-10-28 DIAGNOSIS — L97521 Non-pressure chronic ulcer of other part of left foot limited to breakdown of skin: Secondary | ICD-10-CM | POA: Diagnosis not present

## 2019-10-28 DIAGNOSIS — E1142 Type 2 diabetes mellitus with diabetic polyneuropathy: Secondary | ICD-10-CM

## 2019-10-28 DIAGNOSIS — M79672 Pain in left foot: Secondary | ICD-10-CM | POA: Diagnosis not present

## 2019-10-28 DIAGNOSIS — S98132A Complete traumatic amputation of one left lesser toe, initial encounter: Secondary | ICD-10-CM

## 2019-10-28 DIAGNOSIS — E08621 Diabetes mellitus due to underlying condition with foot ulcer: Secondary | ICD-10-CM

## 2019-10-28 DIAGNOSIS — M216X2 Other acquired deformities of left foot: Secondary | ICD-10-CM | POA: Diagnosis not present

## 2019-10-28 NOTE — Progress Notes (Signed)
Subjective: Shay Bartoli is a 69 y.o. male patient seen in office for follow up evaluation ulcer left foot.  Patient reports that it is doing ok, seems like it is healing but not much drainage or bleeding.  Patient is assisted by wife who is helping with dressing changes and reports that it is doing good however he is here today to discuss possible surgery.  Patient denies any changes in medication or health history reports that his diabetes is doing better and A1c is going down but does not recall number at this visit.  Patient did not check blood sugar this morning.  Patient Active Problem List   Diagnosis Date Noted  . Diabetic foot ulcer ([place]) 10/07/2019  . Benign hypertensive kidney disease with chronic kidney disease 06/17/2019  . Proteinuria 06/17/2019  . History of amputation of lesser toe of left foot ([place]) 08/18/2015  . Hyperlipidemia, mixed 08/18/2015  . Type 2 diabetes mellitus with peripheral neuropathy ([place]) 08/18/2015  . Acute CVA (cerebrovascular accident) ([place]) 08/09/2015  . Acute renal failure superimposed on stage 3 chronic kidney disease ([place]) 08/09/2015  . Essential hypertriglyceridemia 08/09/2015  . Hypertension, essential 08/09/2015  . Right-sided muscle weakness 08/09/2015  . T2DM (type 2 diabetes mellitus) ([place]) 08/09/2015  . Tobacco abuse 08/09/2015  . CKD (chronic kidney disease) 04/17/2013  . DM2 (diabetes mellitus, type 2) ([place]) 04/17/2013  . HTN (hypertension) 04/17/2013  . Osteomyelitis of toe of left foot ([place]) 04/17/2013   Current Outpatient Medications on File Prior to Visit  Medication Sig Dispense Refill  . amitriptyline (ELAVIL) 25 MG tablet     . amLODipine (NORVASC) 10 MG tablet     . aspirin 81 MG chewable tablet Chew by mouth.    Marland Kitchen atorvastatin (LIPITOR) 80 MG tablet     . BISACODYL PO     . Blood Glucose Monitoring Suppl (TRUE METRIX METER) w/Device KIT   0  . Cholecalciferol (VITAMIN D3) 1000 units CAPS Take by mouth.    . furosemide  (LASIX) 20 MG tablet Take 20 mg by mouth.    Marland Kitchen glipiZIDE (GLUCOTROL) 10 MG tablet   1  . glucose blood (CONTOUR TEST) test strip Use 2 (two) times daily.    . insulin aspart (NOVOLOG FLEXPEN) 100 UNIT/ML FlexPen 3 (three) times a day with meals    . Insulin Glargine (LANTUS SOLOSTAR) 100 UNIT/ML Solostar Pen Use up to 50 units per day as per MD instructions    . JANUVIA 50 MG tablet   0  . Lancets (ONETOUCH ULTRASOFT) lancets by Does not apply route.    Marland Kitchen levofloxacin (LEVAQUIN) 500 MG tablet Take 1 tablet (500 mg total) by mouth daily. 14 tablet 0  . liraglutide (VICTOZA) 18 MG/3ML SOPN Inject into the skin.    Marland Kitchen losartan (COZAAR) 100 MG tablet Take by mouth.    . losartan (COZAAR) 50 MG tablet     . pregabalin (LYRICA) 100 MG capsule Take by mouth.    . sodium bicarbonate 650 MG tablet Take by mouth.    . tadalafil (CIALIS) 20 MG tablet     . traMADol (ULTRAM) 50 MG tablet Take 1 tablet (50 mg total) by mouth every 6 (six) hours as needed. 30 tablet o  . ULTICARE MINI PEN NEEDLES 31G X 6 MM MISC   0  . UNABLE TO FIND Diabetic Supplies:  Glucometer, test strips, lancets. Check glucose BID (fasting and 2 hours after eating). One month supply. Refills PRN.    . metFORMIN (  GLUCOPHAGE) 500 MG tablet Take by mouth.    Marland Kitchen NOVOLIN 70/30 FLEXPEN (70-30) 100 UNIT/ML PEN     . TRULICITY 8.98 MK/1.0ZX SOPN      No current facility-administered medications on file prior to visit.   No Known Allergies  No results found for this or any previous visit (from the past 2160 hour(s)).   Social History   Socioeconomic History  . Marital status: Married    Spouse name: Not on file  . Number of children: Not on file  . Years of education: Not on file  . Highest education level: Not on file  Occupational History  . Not on file  Tobacco Use  . Smoking status: Unknown If Ever Smoked  . Smokeless tobacco: Never Used  Substance and Sexual Activity  . Alcohol use: Not on file  . Drug use: Not on file   . Sexual activity: Not on file  Other Topics Concern  . Not on file  Social History Narrative  . Not on file   Social Determinants of Health   Financial Resource Strain:   . Difficulty of Paying Living Expenses: Not on file  Food Insecurity:   . Worried About Charity fundraiser in the Last Year: Not on file  . Ran Out of Food in the Last Year: Not on file  Transportation Needs:   . Lack of Transportation (Medical): Not on file  . Lack of Transportation (Non-Medical): Not on file  Physical Activity:   . Days of Exercise per Week: Not on file  . Minutes of Exercise per Session: Not on file  Stress:   . Feeling of Stress : Not on file  Social Connections:   . Frequency of Communication with Friends and Family: Not on file  . Frequency of Social Gatherings with Friends and Family: Not on file  . Attends Religious Services: Not on file  . Active Member of Clubs or Organizations: Not on file  . Attends Archivist Meetings: Not on file  . Marital Status: Not on file    No family history on file.  No past surgical history on file.   Objective: There were no vitals filed for this visit.  General: Patient is awake, alert, oriented x 3 and in no acute distress.  Dermatology: Skin is warm and dry bilateral with a  healed ulceration Left sub met 3 measures 0.5x1.0cm smaller than previous with granular base improved since last visit with moderate reactive keratosis and mild maceration, there is no malodor, no active drainage, no erythema, no edema. No other acute signs of infection. Nails 1-5 on right and 3-5 on left are mycotic but short.    Vascular: Dorsalis Pedis pulse = 1/4 Bilateral,  Posterior Tibial pulse = 0/4 Bilateral,  Capillary Fill Time < 5 seconds  Neurologic: Vibratory and Protective sensation absent bilateral L>R.  Musculosketal: No pain with palpation to ulcerated area plantar aspect on left foot. No pain with compression to calves bilateral. Amputated  left 1st and 2nd toe.   Assessment and Plan:  Problem List Items Addressed This Visit      Endocrine   Type 2 diabetes mellitus with peripheral neuropathy (HCC)   Relevant Medications   TRULICITY 2.81 VW/8.6LR SOPN   NOVOLIN 70/30 FLEXPEN (70-30) 100 UNIT/ML PEN   Diabetic foot ulcer (HCC) - Primary   Relevant Medications   TRULICITY 3.73 GK/8.1PT SOPN   NOVOLIN 70/30 FLEXPEN (70-30) 100 UNIT/ML PEN    Other Visit Diagnoses  Prominent metatarsal head of left foot       Left foot pain       Amputated toe of left foot ([place])         -Examined patient and discussed the progression of the recurrent wound and treatment alternatives. -Excisionally dedbrided ulceration at plantar 3rd metatarsal on left to healthy bleeding borders removing nonviable tissue using a sterile chisel blade. Wound measures post debridement as above. Wound was debrided to the level of the dermis and subcutaneous tissue with viable wound base exposed to promote healing. Hemostasis was achieved with manuel pressure. Patient tolerated procedure well without any discomfort or anesthesia necessary for this wound debridement.  -Applied actisorb and dry sterile dressing and instructed patient to continue with daily dressings at home consisting of the same as given again this visit. -Continue with post op shoe -Patient opt for surgical management. Consent obtained for removal of metatarsal head wound debridement and application of graft left foot.  Pre and Post op course explained. Risks, benefits, alternatives explained. No guarantees given or implied. Surgical booking slip submitted and provided patient with Surgical packet and [facility] for [facility] -H&P form given - Advised patient to go to the ER or return to office if the wound worsens or if constitutional symptoms are present. -Patient to return to office after surgery or in 3 weeks or sooner if problems arise.   [name], DPM

## 2019-10-28 NOTE — Patient Instructions (Signed)
Pre-Operative Instructions  Congratulations, you have decided to take an important step towards improving your quality of life.  You can be assured that the doctors and staff at [facility] will be with you every step of the way.  Here are some important things you should know:  1. Plan to be at the surgery center/hospital at least 1 (one) hour prior to your scheduled time, unless otherwise directed by the surgical center/hospital staff.  You must have a responsible adult accompany you, remain during the surgery and drive you home.  Make sure you have directions to the surgical center/hospital to ensure you arrive on time. 2. If you are having surgery at [facility] or [facility], you will need a copy of your medical history and physical form from your family physician within one month prior to the date of surgery. We will give you a form for your primary physician to complete.  3. We make every effort to accommodate the date you request for surgery.  However, there are times where surgery dates or times have to be moved.  We will contact you as soon as possible if a change in schedule is required.   4. No aspirin/ibuprofen for one week before surgery.  If you are on aspirin, any non-steroidal anti-inflammatory medications (Mobic, Aleve, Ibuprofen) should not be taken seven (7) days prior to your surgery.  You make take Tylenol for pain prior to surgery.  5. Medications - If you are taking daily heart and blood pressure medications, seizure, reflux, allergy, asthma, anxiety, pain or diabetes medications, make sure you notify the surgery center/hospital before the day of surgery so they can tell you which medications you should take or avoid the day of surgery. 6. No food or drink after midnight the night before surgery unless directed otherwise by surgical center/hospital staff. 7. No alcoholic beverages 24-hours prior to surgery.  No smoking 24-hours prior or 24-hours after  surgery. 8. Wear loose pants or shorts. They should be loose enough to fit over bandages, boots, and casts. 9. Don't wear slip-on shoes. Sneakers are preferred. 10. Bring your boot with you to the surgery center/hospital.  Also bring crutches or a walker if your physician has prescribed it for you.  If you do not have this equipment, it will be provided for you after surgery. 11. If you have not been contacted by the surgery center/hospital by the day before your surgery, call to confirm the date and time of your surgery. 12. Leave-time from work may vary depending on the type of surgery you have.  Appropriate arrangements should be made prior to surgery with your employer. 13. Prescriptions will be provided immediately following surgery by your doctor.  Fill these as soon as possible after surgery and take the medication as directed. Pain medications will not be refilled on weekends and must be approved by the doctor. 14. Remove nail polish on the operative foot and avoid getting pedicures prior to surgery. 15. Wash the night before surgery.  The night before surgery wash the foot and leg well with water and the antibacterial soap provided. Be sure to pay special attention to beneath the toenails and in between the toes.  Wash for at least three (3) minutes. Rinse thoroughly with water and dry well with a towel.  Perform this wash unless told not to do so by your physician.  Enclosed: 1 Ice pack (please put in freezer the night before surgery)   1 Hibiclens skin cleaner     Pre-op instructions  If you have any questions regarding the instructions, please do not hesitate to call our office.  [place]: [address], [place], [place] [address] -- [phone]  [place]: [address] [place], [place] [address] -- [phone]  [place]: [address], [place], [place] [address] -- [phone]   Website: [email or web address] 

## 2019-11-03 ENCOUNTER — Encounter: Payer: Self-pay | Admitting: *Deleted

## 2019-11-03 ENCOUNTER — Telehealth: Payer: Self-pay | Admitting: *Deleted

## 2019-11-03 DIAGNOSIS — Z01812 Encounter for preprocedural laboratory examination: Secondary | ICD-10-CM

## 2019-11-03 NOTE — Progress Notes (Signed)
Per Dr. Cannon Kettle, I sent a surgical medical clearance request letter to Dr. Deloria Lair, Dr. Juleen China, and to Dr. Earma Reading.  [name] is scheduled for surgery on 11/10/2019.

## 2019-11-03 NOTE — Telephone Encounter (Signed)
I am calling in regards to Mr. [name].  He's scheduled for surgery on Monday, February 8.  We're requesting clearance from his primary care physician, Dr. Orvilla Fus, for his Diabetes and from his Nephrologist, Dr. Juleen China.  He has stage 4 kidney failure and it appears from his last visit with the Nephrologist that his Diabetes is not under control.  He's not very compliant so we requesting clearance."  I'll let Dr. Cannon Kettle know.   I am calling you in regards to your surgery that's scheduled for Monday, November 10, 2019.  I received a call from the nurse at the surgical center.  She said we need to get medical clearance from your primary care doctor and from your kidney doctor prior to you having surgery.  "I go see my primary care doctor on Thursday."  That's great, I will send a letter to your Nephrologist, Dr. Juleen China, requesting clearance.  "I already had my Covid test done on this past Friday."  You were not supposed to get that test done until three to four days prior to your surgery date.  I will put in another order.  You should get a call from someone from pre-testing to schedule that appointment.  You can go to [facility] to have it done.   "I don't usually have my calls coming in.  I may miss their call."  Lorrin Mais pretesting phone number is [phone].  You can call them and schedule the appointment.      I sent the medical clearance letters to Dr. Juleen China and Dr. Deloria Lair.

## 2019-11-04 ENCOUNTER — Telehealth: Payer: Self-pay | Admitting: Sports Medicine

## 2019-11-04 NOTE — Telephone Encounter (Signed)
Pt is scheduled for surgery on Monday 02/08 with Dr. Cannon Kettle at [facility]. Pt states his PCP's office just called and cancelled his appointment for this Thursday. States they have not rescheduled his appointment with his PCP but said it probably would not be scheduled until the end of February or sometime in March. Pt wants to know if he could go ahead and get his surgery rescheduled.

## 2019-11-04 NOTE — Telephone Encounter (Signed)
I am returning your call.  You want to reschedule your surgery date?  "No, I can't right now.  They told me I wouldn't be able to get an appointment with my doctor until a month or a month and a half from now.  I don't know when they're going to schedule it."  So, you want to call us back to reschedule your surgery?  "Yes, I will call back once I have my appointment scheduled to see my primary care doctor."  I will cancel your surgery.  "Is there anything else that I need to do?"  No, I will take care of everything.  I called [facility] and spoke to [place].  I canceled his surgery for 11/10/2019.

## 2019-11-06 ENCOUNTER — Other Ambulatory Visit (HOSPITAL_COMMUNITY): Payer: Medicare HMO

## 2019-11-10 ENCOUNTER — Ambulatory Visit (HOSPITAL_BASED_OUTPATIENT_CLINIC_OR_DEPARTMENT_OTHER): Admission: RE | Admit: 2019-11-10 | Payer: Medicare HMO | Source: Home / Self Care | Admitting: Sports Medicine

## 2019-11-10 ENCOUNTER — Encounter (HOSPITAL_BASED_OUTPATIENT_CLINIC_OR_DEPARTMENT_OTHER): Admission: RE | Payer: Self-pay | Source: Home / Self Care

## 2019-11-10 SURGERY — EXCISION, METATARSAL BONE, HEAD
Anesthesia: Monitor Anesthesia Care | Site: Toe | Laterality: Left

## 2019-11-19 ENCOUNTER — Encounter (INDEPENDENT_AMBULATORY_CARE_PROVIDER_SITE_OTHER): Payer: Self-pay | Admitting: *Deleted

## 2019-11-25 ENCOUNTER — Telehealth: Payer: Self-pay

## 2019-11-25 NOTE — Telephone Encounter (Signed)
DOS 12/08/19 METATARSAL HEAD RES 3RD LT - 28113 WOUND DEBRIDEMENT - 49664 GRAFT APPLICATION - 66056  [employer] MEDICARE EFFECTIVE DATE - 10/03/19  PLAN DED - $0.00  OUT OF POCKET - $5000 W/ $4950 REMAINING  CO-INSURANCE - 100%  COPAY $200  PER AUTOMATED SYSTEM PROCEDURE CODES 37294, 11042 AND 26270 DO NOT REQUIRE AUTHORIZATION.  CALL REF # - [id number]

## 2019-12-03 ENCOUNTER — Encounter (HOSPITAL_BASED_OUTPATIENT_CLINIC_OR_DEPARTMENT_OTHER): Payer: Self-pay | Admitting: Sports Medicine

## 2019-12-03 ENCOUNTER — Other Ambulatory Visit: Payer: Self-pay

## 2019-12-03 NOTE — Progress Notes (Signed)
Patient's chart, labs and EKG reviewed with Dr Kalman Shan, [place] for [facility].

## 2019-12-04 ENCOUNTER — Other Ambulatory Visit (HOSPITAL_COMMUNITY)
Admission: RE | Admit: 2019-12-04 | Discharge: 2019-12-04 | Disposition: A | Discharge status: Home / Self Care | Payer: Medicare HMO | Source: Ambulatory Visit | Attending: Sports Medicine | Admitting: Sports Medicine

## 2019-12-05 ENCOUNTER — Other Ambulatory Visit (HOSPITAL_COMMUNITY)
Admission: RE | Admit: 2019-12-05 | Discharge: 2019-12-05 | Disposition: A | Discharge status: Home / Self Care | Payer: Medicare HMO | Source: Ambulatory Visit | Attending: Sports Medicine | Admitting: Sports Medicine

## 2019-12-05 ENCOUNTER — Telehealth: Payer: Self-pay | Admitting: Sports Medicine

## 2019-12-05 DIAGNOSIS — Z20822 Contact with and (suspected) exposure to covid-19: Secondary | ICD-10-CM | POA: Insufficient documentation

## 2019-12-05 DIAGNOSIS — Z01812 Encounter for preprocedural laboratory examination: Secondary | ICD-10-CM | POA: Insufficient documentation

## 2019-12-05 LAB — SARS CORONAVIRUS 2 (TAT 6-24 HRS): SARS Coronavirus 2: NEGATIVE

## 2019-12-05 NOTE — Telephone Encounter (Signed)
Called and told Sonia Baller that Mr. [name] stated he was going to the [facility] before 3 pm today to get his COVID test. Told her that pt stated he went to [facility] yesterday but they were closed and that he had tried calling but could not get anyone on the phone or to return his phone call.

## 2019-12-05 NOTE — Telephone Encounter (Signed)
Pt called and stated he was getting ready to go to the [place] to get his COVID test. Stated he went to [facility] yesterday but they were closed and that's where he wanted to go but could not get anyone to return his call. I told Mr. [name] I would inform Sonia Baller at [facility].

## 2019-12-05 NOTE — Progress Notes (Signed)
Patient states that he went to [facility] testing [facility] for covid Thursday 12-04-19 and it was closed at 1230 and sign stated he needed an appt and he didn't know what to do so he left. I told him he could come to [facility] today before 3pm or tomorrow before 1200. Pt voiced understanding.

## 2019-12-05 NOTE — Telephone Encounter (Signed)
This is Sonia Baller calling from [facility]. Mr. [name] is scheduled to have sx with Dr. Cannon Kettle on Monday. He was supposed to go yesterday to [facility] to get his COVID test but when I looked it looked like he did not go. He can go today between 2 pm and 5 pm to [facility] or come to the [facility] between 8 am and 12 noon. If he does not get the test done by tomorrow he will not be able to have his sx on Monday. If pt is going to go to [facility] please contact us at [phone] so we can notify them and get that set up for the pt.

## 2019-12-05 NOTE — Telephone Encounter (Signed)
Left voicemail asking pt about his COVID test. Told him Sonia Baller with [place] states it looks like he did not have his COVID test at [facility] yesterday. Asked pt to call me back directly to let me know if he did get tested and if not, would he be able to go today between 2 pm and 5 pm so I can notify the sx center at [facility] so they get him scheduled and notified with Forestine Na or that he could go to the [facility].

## 2019-12-05 NOTE — Progress Notes (Signed)
Called Dr. Leeanne Rio office and left message on machine for surgery scheduler, Darrick Penna, informing them that patient does not appear to have gone for covid test on 3/4 as instructed. Left my callback information, as well as dates and times available for [facility] and [facility] testing sites that would allow surgery to remain scheduled for Monday 3/8, but if patient did not get covid tested, surgery would be cancelled. Requested the office to notify me if patient planned to go to [facility] today for testing so that I could make the necessary arrangements.

## 2019-12-07 NOTE — Anesthesia Preprocedure Evaluation (Addendum)
Anesthesia Evaluation  Patient identified by MRN, date of birth, ID band Patient awake    Reviewed: Allergy & Precautions, NPO status , Patient's Chart, lab work & pertinent test results  Airway Mallampati: II  TM Distance: >3 FB     Dental   Pulmonary former smoker,    breath sounds clear to auscultation       Cardiovascular hypertension,  Rhythm:Regular Rate:Normal     Neuro/Psych  Neuromuscular disease CVA    GI/Hepatic Neg liver ROS, GERD  ,  Endo/Other  diabetes  Renal/GU Renal disease     Musculoskeletal   Abdominal   Peds  Hematology   Anesthesia Other Findings   Reproductive/Obstetrics                            Anesthesia Physical Anesthesia Plan  ASA: III  Anesthesia Plan: MAC   Post-op Pain Management:    Induction: Intravenous  PONV Risk Score and Plan: 1 and Ondansetron, Dexamethasone and Midazolam  Airway Management Planned: Nasal Cannula and Nasal CPAP  Additional Equipment:   Intra-op Plan:   Post-operative Plan:   Informed Consent: I have reviewed the patients History and Physical, chart, labs and discussed the procedure including the risks, benefits and alternatives for the proposed anesthesia with the patient or authorized representative who has indicated his/her understanding and acceptance.     Dental advisory given  Plan Discussed with: CRNA and Anesthesiologist  Anesthesia Plan Comments:        Anesthesia Quick Evaluation

## 2019-12-08 ENCOUNTER — Ambulatory Visit (HOSPITAL_BASED_OUTPATIENT_CLINIC_OR_DEPARTMENT_OTHER)
Admission: RE | Admit: 2019-12-08 | Discharge: 2019-12-08 | Disposition: A | Discharge status: Home / Self Care | Payer: Medicare HMO | Attending: Sports Medicine | Admitting: Sports Medicine

## 2019-12-08 ENCOUNTER — Encounter (HOSPITAL_BASED_OUTPATIENT_CLINIC_OR_DEPARTMENT_OTHER): Payer: Self-pay | Admitting: Sports Medicine

## 2019-12-08 ENCOUNTER — Ambulatory Visit (HOSPITAL_BASED_OUTPATIENT_CLINIC_OR_DEPARTMENT_OTHER): Payer: Medicare HMO | Admitting: Anesthesiology

## 2019-12-08 ENCOUNTER — Encounter (HOSPITAL_BASED_OUTPATIENT_CLINIC_OR_DEPARTMENT_OTHER)
Admission: RE | Disposition: A | Discharge status: Home / Self Care | Payer: Self-pay | Source: Home / Self Care | Attending: Sports Medicine

## 2019-12-08 ENCOUNTER — Encounter: Payer: Self-pay | Admitting: Sports Medicine

## 2019-12-08 ENCOUNTER — Other Ambulatory Visit: Payer: Self-pay

## 2019-12-08 DIAGNOSIS — L97529 Non-pressure chronic ulcer of other part of left foot with unspecified severity: Secondary | ICD-10-CM | POA: Diagnosis not present

## 2019-12-08 DIAGNOSIS — E1122 Type 2 diabetes mellitus with diabetic chronic kidney disease: Secondary | ICD-10-CM | POA: Insufficient documentation

## 2019-12-08 DIAGNOSIS — Z87891 Personal history of nicotine dependence: Secondary | ICD-10-CM | POA: Diagnosis not present

## 2019-12-08 DIAGNOSIS — N183 Chronic kidney disease, stage 3 unspecified: Secondary | ICD-10-CM | POA: Insufficient documentation

## 2019-12-08 DIAGNOSIS — Z9889 Other specified postprocedural states: Secondary | ICD-10-CM

## 2019-12-08 DIAGNOSIS — I129 Hypertensive chronic kidney disease with stage 1 through stage 4 chronic kidney disease, or unspecified chronic kidney disease: Secondary | ICD-10-CM | POA: Diagnosis not present

## 2019-12-08 DIAGNOSIS — E11621 Type 2 diabetes mellitus with foot ulcer: Secondary | ICD-10-CM | POA: Insufficient documentation

## 2019-12-08 DIAGNOSIS — Z794 Long term (current) use of insulin: Secondary | ICD-10-CM | POA: Diagnosis not present

## 2019-12-08 DIAGNOSIS — Z79899 Other long term (current) drug therapy: Secondary | ICD-10-CM | POA: Diagnosis not present

## 2019-12-08 DIAGNOSIS — M21542 Acquired clubfoot, left foot: Secondary | ICD-10-CM | POA: Diagnosis not present

## 2019-12-08 DIAGNOSIS — Z7982 Long term (current) use of aspirin: Secondary | ICD-10-CM | POA: Diagnosis not present

## 2019-12-08 DIAGNOSIS — E782 Mixed hyperlipidemia: Secondary | ICD-10-CM | POA: Diagnosis not present

## 2019-12-08 DIAGNOSIS — Z8673 Personal history of transient ischemic attack (TIA), and cerebral infarction without residual deficits: Secondary | ICD-10-CM | POA: Insufficient documentation

## 2019-12-08 DIAGNOSIS — E1142 Type 2 diabetes mellitus with diabetic polyneuropathy: Secondary | ICD-10-CM | POA: Diagnosis not present

## 2019-12-08 HISTORY — DX: Type 2 diabetes mellitus without complications: E11.9

## 2019-12-08 HISTORY — DX: Chronic kidney disease, unspecified: N18.9

## 2019-12-08 HISTORY — PX: GRAFT APPLICATION: SHX6696

## 2019-12-08 HISTORY — DX: Essential (primary) hypertension: I10

## 2019-12-08 HISTORY — PX: WOUND DEBRIDEMENT: SHX247

## 2019-12-08 HISTORY — DX: Hyperlipidemia, unspecified: E78.5

## 2019-12-08 HISTORY — DX: Gastro-esophageal reflux disease without esophagitis: K21.9

## 2019-12-08 HISTORY — DX: Patient's other noncompliance with medication regimen for other reason: Z91.148

## 2019-12-08 HISTORY — DX: Patient's other noncompliance with medication regimen: Z91.14

## 2019-12-08 HISTORY — PX: METATARSAL HEAD EXCISION: SHX5027

## 2019-12-08 HISTORY — DX: Diabetes mellitus due to underlying condition with foot ulcer: L97.501

## 2019-12-08 HISTORY — DX: Diabetes mellitus due to underlying condition with foot ulcer: E08.621

## 2019-12-08 HISTORY — DX: Non-pressure chronic ulcer of other part of unspecified foot limited to breakdown of skin: E08.621

## 2019-12-08 LAB — GLUCOSE, CAPILLARY
Glucose-Capillary: 177 mg/dL — ABNORMAL HIGH (ref 70–99)
Glucose-Capillary: 198 mg/dL — ABNORMAL HIGH (ref 70–99)

## 2019-12-08 SURGERY — EXCISION, METATARSAL BONE, HEAD
Anesthesia: Monitor Anesthesia Care | Site: Toe | Laterality: Left

## 2019-12-08 MED ORDER — LIDOCAINE HCL 1 % IJ SOLN
INTRAMUSCULAR | Status: DC | PRN
  Administered 2019-12-08: 5 mL

## 2019-12-08 MED ORDER — MIDAZOLAM HCL 2 MG/2ML IJ SOLN
INTRAMUSCULAR | Status: AC
  Filled 2019-12-08: qty 2

## 2019-12-08 MED ORDER — LACTATED RINGERS IV SOLN: INTRAVENOUS | Status: DC

## 2019-12-08 MED ORDER — CHLORHEXIDINE GLUCONATE CLOTH 2 % EX PADS: 6.0000 | MEDICATED_PAD | Freq: Once | CUTANEOUS | Status: DC

## 2019-12-08 MED ORDER — LIDOCAINE 2% (20 MG/ML) 5 ML SYRINGE
INTRAMUSCULAR | Status: AC
  Filled 2019-12-08: qty 5

## 2019-12-08 MED ORDER — FENTANYL CITRATE (PF) 100 MCG/2ML IJ SOLN
INTRAMUSCULAR | Status: AC
  Filled 2019-12-08: qty 2

## 2019-12-08 MED ORDER — PROPOFOL 500 MG/50ML IV EMUL
INTRAVENOUS | Status: DC | PRN
  Administered 2019-12-08: 75 ug/kg/min via INTRAVENOUS

## 2019-12-08 MED ORDER — SODIUM CHLORIDE 0.9 % IV SOLN: INTRAVENOUS | Status: DC | PRN

## 2019-12-08 MED ORDER — CEFAZOLIN SODIUM-DEXTROSE 2-4 GM/100ML-% IV SOLN
INTRAVENOUS | Status: AC
  Filled 2019-12-08: qty 100

## 2019-12-08 MED ORDER — MIDAZOLAM HCL 2 MG/2ML IJ SOLN: 1.0000 mg | INTRAMUSCULAR | Status: DC | PRN

## 2019-12-08 MED ORDER — DOCUSATE SODIUM 100 MG PO CAPS: 100.0000 mg | ORAL_CAPSULE | Freq: Every day | ORAL | 2 refills | Status: DC | PRN

## 2019-12-08 MED ORDER — PHENYLEPHRINE 40 MCG/ML (10ML) SYRINGE FOR IV PUSH (FOR BLOOD PRESSURE SUPPORT)
PREFILLED_SYRINGE | INTRAVENOUS | Status: DC | PRN
  Administered 2019-12-08: 120 ug via INTRAVENOUS
  Administered 2019-12-08 (×2): 80 ug via INTRAVENOUS
  Administered 2019-12-08: 120 ug via INTRAVENOUS

## 2019-12-08 MED ORDER — HYDROCODONE-ACETAMINOPHEN 10-325 MG PO TABS: 1.0000 | ORAL_TABLET | Freq: Four times a day (QID) | ORAL | 0 refills | Status: AC | PRN

## 2019-12-08 MED ORDER — PROMETHAZINE HCL 12.5 MG PO TABS: 12.5000 mg | ORAL_TABLET | Freq: Four times a day (QID) | ORAL | 0 refills | Status: DC | PRN

## 2019-12-08 MED ORDER — PHENYLEPHRINE 40 MCG/ML (10ML) SYRINGE FOR IV PUSH (FOR BLOOD PRESSURE SUPPORT)
PREFILLED_SYRINGE | INTRAVENOUS | Status: AC
  Filled 2019-12-08: qty 10

## 2019-12-08 MED ORDER — PROPOFOL 500 MG/50ML IV EMUL
INTRAVENOUS | Status: AC
  Filled 2019-12-08: qty 50

## 2019-12-08 MED ORDER — LIDOCAINE HCL (CARDIAC) PF 100 MG/5ML IV SOSY
PREFILLED_SYRINGE | INTRAVENOUS | Status: DC | PRN
  Administered 2019-12-08: 40 mg via INTRAVENOUS

## 2019-12-08 MED ORDER — IBUPROFEN 800 MG PO TABS: 800.0000 mg | ORAL_TABLET | Freq: Three times a day (TID) | ORAL | 0 refills | Status: DC | PRN

## 2019-12-08 MED ORDER — CEFAZOLIN SODIUM-DEXTROSE 2-4 GM/100ML-% IV SOLN
2.0000 g | INTRAVENOUS | Status: AC
  Administered 2019-12-08: 2 g via INTRAVENOUS

## 2019-12-08 MED ORDER — BUPIVACAINE HCL (PF) 0.5 % IJ SOLN
INTRAMUSCULAR | Status: AC
  Filled 2019-12-08: qty 30

## 2019-12-08 MED ORDER — LIDOCAINE HCL (PF) 1 % IJ SOLN
INTRAMUSCULAR | Status: AC
  Filled 2019-12-08: qty 30

## 2019-12-08 MED ORDER — FENTANYL CITRATE (PF) 100 MCG/2ML IJ SOLN
50.0000 ug | INTRAMUSCULAR | Status: DC | PRN
  Administered 2019-12-08: 50 ug via INTRAVENOUS

## 2019-12-08 MED ORDER — FENTANYL CITRATE (PF) 100 MCG/2ML IJ SOLN
25.0000 ug | INTRAMUSCULAR | Status: DC | PRN
  Administered 2019-12-08: 09:00:00 50 ug via INTRAVENOUS

## 2019-12-08 SURGICAL SUPPLY — 52 items
BLADE SURG 15 STRL LF DISP TIS (BLADE) ×4 IMPLANT
BLADE SURG 15 STRL SS (BLADE) ×2
BNDG COHESIVE 3X5 TAN STRL LF (GAUZE/BANDAGES/DRESSINGS) IMPLANT
BNDG CONFORM 2 STRL LF (GAUZE/BANDAGES/DRESSINGS) ×3 IMPLANT
BNDG ELASTIC 3X5.8 VLCR STR LF (GAUZE/BANDAGES/DRESSINGS) ×3 IMPLANT
BNDG ESMARK 4X9 LF (GAUZE/BANDAGES/DRESSINGS) ×3 IMPLANT
BNDG GAUZE ELAST 4 BULKY (GAUZE/BANDAGES/DRESSINGS) ×3 IMPLANT
COVER BACK TABLE 60X90IN (DRAPES) ×3 IMPLANT
COVER WAND RF STERILE (DRAPES) IMPLANT
CUFF TOURN SGL QUICK 18X4 (TOURNIQUET CUFF) ×3 IMPLANT
DRAPE EXTREMITY T 121X128X90 (DISPOSABLE) ×3 IMPLANT
DRAPE IMP U-DRAPE 54X76 (DRAPES) ×3 IMPLANT
DRAPE U-SHAPE 47X51 STRL (DRAPES) ×3 IMPLANT
DRSG EMULSION OIL 3X3 NADH (GAUZE/BANDAGES/DRESSINGS) ×3 IMPLANT
DRSG PAD ABDOMINAL 8X10 ST (GAUZE/BANDAGES/DRESSINGS) IMPLANT
DURAPREP 26ML APPLICATOR (WOUND CARE) ×3 IMPLANT
ELECT REM PT RETURN 9FT ADLT (ELECTROSURGICAL) ×3
ELECTRODE REM PT RTRN 9FT ADLT (ELECTROSURGICAL) ×2 IMPLANT
GAUZE 4X4 16PLY RFD (DISPOSABLE) IMPLANT
GAUZE SPONGE 4X4 12PLY STRL (GAUZE/BANDAGES/DRESSINGS) ×3 IMPLANT
GAUZE XEROFORM 1X8 LF (GAUZE/BANDAGES/DRESSINGS) IMPLANT
GLOVE BIO SURGEON STRL SZ 6.5 (GLOVE) ×6 IMPLANT
GLOVE BIOGEL PI IND STRL 6.5 (GLOVE) ×4 IMPLANT
GLOVE BIOGEL PI IND STRL 7.0 (GLOVE) ×4 IMPLANT
GLOVE BIOGEL PI INDICATOR 6.5 (GLOVE) ×2
GLOVE BIOGEL PI INDICATOR 7.0 (GLOVE) ×2
GOWN STRL REUS W/ TWL LRG LVL3 (GOWN DISPOSABLE) ×2 IMPLANT
GOWN STRL REUS W/TWL LRG LVL3 (GOWN DISPOSABLE) ×1
IMPL STRAVIX 2X4 (Tissue) ×2 IMPLANT
IMPLANT STRAVIX 2X4 (Tissue) ×3 IMPLANT
NDL SAFETY ECLIPSE 18X1.5 (NEEDLE) IMPLANT
NEEDLE HYPO 18GX1.5 SHARP (NEEDLE)
NEEDLE HYPO 25X1 1.5 SAFETY (NEEDLE) ×6 IMPLANT
NS IRRIG 1000ML POUR BTL (IV SOLUTION) IMPLANT
PACK BASIN DAY SURGERY FS (CUSTOM PROCEDURE TRAY) ×3 IMPLANT
PADDING CAST ABS 4INX4YD NS (CAST SUPPLIES) ×1
PADDING CAST ABS COTTON 4X4 ST (CAST SUPPLIES) ×2 IMPLANT
PENCIL SMOKE EVACUATOR (MISCELLANEOUS) ×3 IMPLANT
SLEEVE SCD COMPRESS KNEE MED (MISCELLANEOUS) ×3 IMPLANT
STOCKINETTE 6  STRL (DRAPES) ×1
STOCKINETTE 6 STRL (DRAPES) ×2 IMPLANT
STOCKINETTE SYNTHETIC 4 NONSTR (MISCELLANEOUS) ×3 IMPLANT
STRIP SUTURE WOUND CLOSURE 1/2 (MISCELLANEOUS) IMPLANT
SUT ETHILON 3 0 PS 1 (SUTURE) ×3 IMPLANT
SUT MERSILENE 2.0 SH NDLE (SUTURE) ×3 IMPLANT
SUT MNCRL AB 3-0 PS2 18 (SUTURE) ×3 IMPLANT
SUT MNCRL AB 4-0 PS2 18 (SUTURE) IMPLANT
SUT MON AB 5-0 PS2 18 (SUTURE) ×3 IMPLANT
SYR 10ML LL (SYRINGE) ×6 IMPLANT
SYR BULB 3OZ (MISCELLANEOUS) ×3 IMPLANT
TOWEL GREEN STERILE FF (TOWEL DISPOSABLE) ×3 IMPLANT
UNDERPAD 30X36 HEAVY ABSORB (UNDERPADS AND DIAPERS) ×3 IMPLANT

## 2019-12-08 NOTE — Brief Op Note (Signed)
12/08/2019  8:50 AM  PATIENT:  [name]  69 y.o. male  PRE-OPERATIVE DIAGNOSIS:  DIABETIC ULCER,PLANTER PLANTAR FLEXED METATARSAL  POST-OPERATIVE DIAGNOSIS:  DIABETIC ULCER,PLANTER PLANTAR FLEXED METATARSAL  PROCEDURE:  Procedure(s): METATARSAL HEAD RECESSION THIRD LEFT (Left) DEBRIDEMENT WOUND (Left) GRAFT APPLICATION (Left)  SURGEON:  Surgeon(s) and Role:    [name], DPM - Primary  PHYSICIAN ASSISTANT: None  ASSISTANTS: none   ANESTHESIA:   local  EBL:  2 mL   BLOOD ADMINISTERED:none  DRAINS: none   LOCAL MEDICATIONS USED:  BUPIVICAINE AND LIDOCAINE  SPECIMEN:  No Specimen  DISPOSITION OF SPECIMEN:  N/A  COUNTS:  YES  TOURNIQUET:   Total Tourniquet Time Documented: Calf (Left) - 43 minutes Total: Calf (Left) - 43 minutes   DICTATION: .Note written in EPIC  PLAN OF CARE: Discharge to home after PACU  PATIENT DISPOSITION:  PACU - hemodynamically stable.   Delay start of Pharmacological VTE agent (>24hrs) due to surgical blood loss or risk of bleeding: no

## 2019-12-08 NOTE — Anesthesia Postprocedure Evaluation (Signed)
Anesthesia Post Note  Patient: [name]  Procedure(s) Performed: METATARSAL HEAD RECESSION THIRD LEFT (Left Toe) DEBRIDEMENT WOUND (Left Foot) GRAFT APPLICATION (Left Foot)     Patient location during evaluation: PACU Anesthesia Type: MAC Level of consciousness: awake Pain management: pain level controlled Vital Signs Assessment: post-procedure vital signs reviewed and stable Respiratory status: spontaneous breathing Cardiovascular status: stable Postop Assessment: no apparent nausea or vomiting Anesthetic complications: no    Last Vitals:  Vitals:   12/08/19 0915 12/08/19 0946  BP: (!) 148/85 (!) 142/69  Pulse: 86 88  Resp: 11 16  Temp:  36.7 C  SpO2: 100% 100%    Last Pain:  Vitals:   12/08/19 0946  TempSrc: Oral  PainSc: 1                  Klye Besecker

## 2019-12-08 NOTE — H&P (Signed)
H&P: Podiatry   OIB:BCWUGQ Brem 69 y.o. male  patient with a history of DFU seen on several occassions in office for wound care at left foot; Patient has tried multiple conservative treatments and opted for Surgical management. Patient had pre-op physical and clearance for Left foot wound debridement, removal of bone and placement of graft completed. Patient met this AM in pre-op holding area; informed consent reviewed and signed. All questions answered.Left foot/surgical site marked.  This am patient denies any other pedal complaints. Denies Nausea/fever/vomiting/chills/night sweats/overnight events. Confirms NPO since midnight.   Patient Active Problem List   Diagnosis Date Noted  . Diabetic foot ulcer ([place]) 10/07/2019  . Benign hypertensive kidney disease with chronic kidney disease 06/17/2019  . Proteinuria 06/17/2019  . History of amputation of lesser toe of left foot ([place]) 08/18/2015  . Hyperlipidemia, mixed 08/18/2015  . Type 2 diabetes mellitus with peripheral neuropathy ([place]) 08/18/2015  . Acute CVA (cerebrovascular accident) ([place]) 08/09/2015  . Acute renal failure superimposed on stage 3 chronic kidney disease ([place]) 08/09/2015  . Essential hypertriglyceridemia 08/09/2015  . Hypertension, essential 08/09/2015  . Right-sided muscle weakness 08/09/2015  . T2DM (type 2 diabetes mellitus) ([place]) 08/09/2015  . Tobacco abuse 08/09/2015  . CKD (chronic kidney disease) 04/17/2013  . DM2 (diabetes mellitus, type 2) ([place]) 04/17/2013  . HTN (hypertension) 04/17/2013  . Osteomyelitis of toe of left foot ([place]) 04/17/2013    No current facility-administered medications on file prior to encounter.   Current Outpatient Medications on File Prior to Encounter  Medication Sig Dispense Refill  . amitriptyline (ELAVIL) 25 MG tablet     . amLODipine (NORVASC) 10 MG tablet     . aspirin 81 MG chewable tablet Chew by mouth.    Marland Kitchen atorvastatin (LIPITOR) 80 MG tablet     . glipiZIDE (GLUCOTROL)  10 MG tablet   1  . insulin aspart (NOVOLOG FLEXPEN) 100 UNIT/ML FlexPen 3 (three) times a day with meals    . losartan (COZAAR) 100 MG tablet Take by mouth.    . sodium bicarbonate 650 MG tablet Take by mouth.    . tadalafil (CIALIS) 20 MG tablet     . TRULICITY 9.16 XI/5.0TU SOPN     . Blood Glucose Monitoring Suppl (TRUE METRIX METER) w/Device KIT   0  . glucose blood (CONTOUR TEST) test strip Use 2 (two) times daily.    . Lancets (ONETOUCH ULTRASOFT) lancets by Does not apply route.    . metFORMIN (GLUCOPHAGE) 500 MG tablet Take by mouth.    Marland Kitchen ULTICARE MINI PEN NEEDLES 31G X 6 MM MISC   0  . UNABLE TO FIND Diabetic Supplies:  Glucometer, test strips, lancets. Check glucose BID (fasting and 2 hours after eating). One month supply. Refills PRN.       No Known Allergies  Social History   Socioeconomic History  . Marital status: Married    Spouse name: Not on file  . Number of children: Not on file  . Years of education: Not on file  . Highest education level: Not on file  Occupational History  . Not on file  Tobacco Use  . Smoking status: Former Research scientist (life sciences)  . Smokeless tobacco: Never Used  Substance and Sexual Activity  . Alcohol use: Not Currently  . Drug use: Never  . Sexual activity: Not on file  Other Topics Concern  . Not on file  Social History Narrative  . Not on file   Social Determinants of Health   Financial  Resource Strain:   . Difficulty of Paying Living Expenses: Not on file  Food Insecurity:   . Worried About Charity fundraiser in the Last Year: Not on file  . Ran Out of Food in the Last Year: Not on file  Transportation Needs:   . Lack of Transportation (Medical): Not on file  . Lack of Transportation (Non-Medical): Not on file  Physical Activity:   . Days of Exercise per Week: Not on file  . Minutes of Exercise per Session: Not on file  Stress:   . Feeling of Stress : Not on file  Social Connections:   . Frequency of Communication with Friends  and Family: Not on file  . Frequency of Social Gatherings with Friends and Family: Not on file  . Attends Religious Services: Not on file  . Active Member of Clubs or Organizations: Not on file  . Attends Archivist Meetings: Not on file  . Marital Status: Not on file  Intimate Partner Violence:   . Fear of Current or Ex-Partner: Not on file  . Emotionally Abused: Not on file  . Physically Abused: Not on file  . Sexually Abused: Not on file    Past Surgical History:  Procedure Laterality Date  . AMPUTATION OF REPLICATED TOES Left 2947   left big toe, secont toe  . APPENDECTOMY    . CHOLECYSTECTOMY    . COLON SURGERY    . HERNIA REPAIR      History reviewed. No pertinent family history.   REVIEW OF SYSTEMS: Neurologic: Denies vertigo, syncope, convulsions or  headaches. Musculoskeletal: No muscle or joint pain. Cardiorespiratory:  Denies shortness of breath, dyspnea on exertion, chest pain, cough or  hemoptysis. Gastrointestinal: Denies loose stool, Denies emesis, melena, constipationor rectal  bleeding. Genitourinary: No difficulty with voiding noted.  PHYSICAL EXAMINATION:  Today's Vitals   12/03/19 0925 12/08/19 0701  BP:  (!) 154/67  Pulse:  (!) 106  Resp:  (!) 22  Temp:  (!) 97.3 F (36.3 C)  TempSrc:  Oral  SpO2:  100%  Weight: 99.3 kg 95 kg  Height: 6' (1.829 m) 6' (1.829 m)  PainSc:  0-No pain    GENERAL: Well-developed, well-nourished, in no acute distress. Alert  and cooperative.   LOWER EXTREMITY EXAM: Dermatology: Skin is warm and dry bilateral with a history of ulceration Left sub met 3, dressing intact to left foot    Vascular: Dorsalis Pedis pulse = 1/4 Bilateral,  Posterior Tibial pulse = 0/4 Bilateral,  Capillary Fill Time < 5 seconds  Neurologic: Vibratory and Protective sensation absent bilateral L>R.  Musculosketal: Minimal pain with palpation to ulcerated area plantar aspect on left foot. No pain with  compression to calves bilateral. Amputated left 1st and 2nd toe.   XRAY, Left foot- See chart 07-15-2019  ASSESSMENTS:  1.Diabetic foot ulcer with neuropathy  2. Plantarflexed metatarsal 3. History of previous amputations 4. Left foot pain  PLAN OF CARE: Patient seen and evaluated 1. History and physical completed 2. Patient NPO since midnight 3. Previous Imaging reviewed  4. Consent for surgery explained and obtained for Left foot removal of bone metatarsal head and wound debridement with placement of graft; risk and benefits explained; all questions answered and no guarantees granted. 5. Patient to undergo above surgical procedure 6. Case discussed with patient and to meet with wife represented after the procedure 7. To resume all home meds post-procedure and to give prn pain meds, anti-nausea, and anti-constipation medications post op  8. Will continue to follow closely/ see post-operative in the office within 1 week.  [name], DPM

## 2019-12-08 NOTE — Discharge Instructions (Signed)
Attached to chart    Post Anesthesia Home Care Instructions  Activity: Get plenty of rest for the remainder of the day. A responsible individual must stay with you for 24 hours following the procedure.  For the next 24 hours, DO NOT: -Drive a car -Paediatric nurse -Drink alcoholic beverages -Take any medication unless instructed by your physician -Make any legal decisions or sign important papers.  Meals: Start with liquid foods such as gelatin or soup. Progress to regular foods as tolerated. Avoid greasy, spicy, heavy foods. If nausea and/or vomiting occur, drink only clear liquids until the nausea and/or vomiting subsides. Call your physician if vomiting continues.  Special Instructions/Symptoms: Your throat may feel dry or sore from the anesthesia or the breathing tube placed in your throat during surgery. If this causes discomfort, gargle with warm salt water. The discomfort should disappear within 24 hours.  If you had a scopolamine patch placed behind your ear for the management of post- operative nausea and/or vomiting:  1. The medication in the patch is effective for 72 hours, after which it should be removed.  Wrap patch in a tissue and discard in the trash. Wash hands thoroughly with soap and water. 2. You may remove the patch earlier than 72 hours if you experience unpleasant side effects which may include dry mouth, dizziness or visual disturbances. 3. Avoid touching the patch. Wash your hands with soap and water after contact with the patch.

## 2019-12-08 NOTE — Op Note (Signed)
DATE: 12-08-19  SURGEON: [name], DPM  PREOPERATIVE DIAGNOSIS: Chronic Left diabetic foot ulceration with prominent metatarsal head  POSTOPERATIVE DIAGNOSIS:Same  PROCEDURE PERFORMED: Left wound debridement and removal of bone/met head resection with placement of Stravix 2x4cm wound graft  HEMOSTASIS:Left ankle tourniquet  ESTIMATED BLOOD LOSS: Minimal  ANESTHESIA: MAC with local 20cc of 1:1 mixture 1% lidocaine and 0.5% marcaine plain  SPECIMENS: None   COMPLICATIONS: None.  INDICATIONS FOR PROCEDURE: This patient is a pleasant 69 y.o. diabetic male who has been seen on many occassions for Left foot ulceration without resolution with conservative wound care. At this time, all risks, complications, benefits, and alternatives were explained in detail to the patient. Patient opts for Left foot wound debridement  And removal of bone (met heal) with placement of graft. Risks and complications include but are not limited to infection, recurrence of symptoms, pain, numbness, wound dehiscence, delayed healing, as well as need for future surgery/amputation. No guarantees were given or applied. All questions were answered to the patient's satisfaction, and the patient has consented to the above procedure. All preoperative labs and H&P, medical clearances have been obtained and NPO status past midnight has been confirmed.     PREPARATION FOR PROCEDURE: The patient was brought to the operating room and placed on the operating table in supine position. A pneumatic ankletourniquet was placed about the patient's Left foot but not yet inflated. After the department of anesthesia had administered MAC anesthesia. A local block was administered and The Left foot was then scrubbed, prepped, and draped in the usual aseptic manner. An Esmarch bandage was utilized to exsanguinate the patient's Left foot and leg, and the pneumatic tourniquet was inflated to 250 mmHg.   PROCEDURE IN  DETAIL: At this time, attention was directed to the patient's Left foot where there was of note prominent bone and a full thickness ulceration plantar aspect of the foot that measures 1x2cm predebridement. Attention was directed to the dorsal foot where a linear incision was made over the area of bony prominence/3rd met head with care to protect and retract all neurovascular structures, once the 3rd metatarsal bone was identified the bone was removed using sagittal saw. Following removal the area was flushed with saline and sutured closed using 2-0 and 3-0 vicryl and 4-0 nylon. Attention was then directed to the plantar wound which was thoroughly debrided using 15 blade excising the wound and removing all nonviable tissue, wound measured post debridement 2x4cm with healthy granular and fatty tissue base. The area was flushed with saline, Then Stravix was secured to wound bed using  2-0 vicryl. A post op block consisting of 10cc of 0.5% marcaine was administered to surgical site. The Left foot was then dressed with adaptic overlying the suture sites, 4 x 4 gauze, abd, Kerlix, and Coban and ACE. At this time, the Left pneumatic tourniquet was deflated, and a positive hyperemic response was noted to remaining Left digits. The patient tolerated the procedure and anesthesia well. Upon transfer to the recovery room, the patient's vital signs were stable, and neurovascular status was intact.   Postoperative prescriptions and instructions were written and given to the patient who will return to the office of Dr. Cannon Kettle in 1 week for continued care and management of this patient.  [name], DPM

## 2019-12-08 NOTE — Transfer of Care (Signed)
Immediate Anesthesia Transfer of Care Note  Patient: [name]  Procedure(s) Performed: METATARSAL HEAD RECESSION THIRD LEFT (Left Toe) DEBRIDEMENT WOUND (Left Foot) GRAFT APPLICATION (Left Foot)  Patient Location: PACU  Anesthesia Type:MAC  Level of Consciousness: awake, alert  and oriented  Airway & Oxygen Therapy: Patient Spontanous Breathing and Patient connected to face mask oxygen  Post-op Assessment: Report given to RN and Post -op Vital signs reviewed and stable  Post vital signs: Reviewed and stable  Last Vitals:  Vitals Value Taken Time  BP 119/67 12/08/19 0846  Temp    Pulse 85 12/08/19 0851  Resp 15 12/08/19 0851  SpO2 100 % 12/08/19 0851  Vitals shown include unvalidated device data.  Last Pain:  Vitals:   12/08/19 0701  TempSrc: Oral  PainSc: 0-No pain      Patients Stated Pain Goal: 4 (62/22/97 9892)  Complications: No apparent anesthesia complications

## 2019-12-09 ENCOUNTER — Encounter: Payer: Self-pay | Admitting: *Deleted

## 2019-12-09 ENCOUNTER — Telehealth: Payer: Self-pay | Admitting: Sports Medicine

## 2019-12-09 NOTE — Telephone Encounter (Signed)
Post op check phone call made to patient. Patient reports that he is doing good and staying off it. Reminded him to rest and elevate and takes pain meds as Rx'd. Patient asks when his appt and I reminded him on Tues at 2pm. Patient expressed thanks for my call. -Dr. Cannon Kettle

## 2019-12-16 ENCOUNTER — Encounter: Payer: Self-pay | Admitting: Sports Medicine

## 2019-12-16 ENCOUNTER — Ambulatory Visit (INDEPENDENT_AMBULATORY_CARE_PROVIDER_SITE_OTHER): Payer: Medicare HMO

## 2019-12-16 ENCOUNTER — Ambulatory Visit (INDEPENDENT_AMBULATORY_CARE_PROVIDER_SITE_OTHER): Payer: Medicare HMO | Admitting: Sports Medicine

## 2019-12-16 ENCOUNTER — Other Ambulatory Visit: Payer: Self-pay

## 2019-12-16 VITALS — Temp 98.0°F

## 2019-12-16 DIAGNOSIS — E1142 Type 2 diabetes mellitus with diabetic polyneuropathy: Secondary | ICD-10-CM

## 2019-12-16 DIAGNOSIS — M21542 Acquired clubfoot, left foot: Secondary | ICD-10-CM | POA: Diagnosis not present

## 2019-12-16 DIAGNOSIS — Z09 Encounter for follow-up examination after completed treatment for conditions other than malignant neoplasm: Secondary | ICD-10-CM

## 2019-12-16 DIAGNOSIS — L97521 Non-pressure chronic ulcer of other part of left foot limited to breakdown of skin: Secondary | ICD-10-CM

## 2019-12-16 DIAGNOSIS — E08621 Diabetes mellitus due to underlying condition with foot ulcer: Secondary | ICD-10-CM

## 2019-12-16 NOTE — Progress Notes (Signed)
Subjective: [name] is a 69 y.o. male patient seen today in office for POV #1 (DOS 12-08-19), S/P left 3rd met head resection, wound debridement and application of Stravix graft. Patient denies pain at surgical site, denies calf pain, denies headache, chest pain, shortness of breath, nausea, vomiting, fever, or chills. No other issues noted.   Patient Active Problem List   Diagnosis Date Noted  . Diabetic foot ulcer ([place]) 10/07/2019  . Benign hypertensive kidney disease with chronic kidney disease 06/17/2019  . Proteinuria 06/17/2019  . History of amputation of lesser toe of left foot ([place]) 08/18/2015  . Hyperlipidemia, mixed 08/18/2015  . Type 2 diabetes mellitus with peripheral neuropathy ([place]) 08/18/2015  . Acute CVA (cerebrovascular accident) ([place]) 08/09/2015  . Acute renal failure superimposed on stage 3 chronic kidney disease ([place]) 08/09/2015  . Essential hypertriglyceridemia 08/09/2015  . Hypertension, essential 08/09/2015  . Right-sided muscle weakness 08/09/2015  . T2DM (type 2 diabetes mellitus) ([place]) 08/09/2015  . Tobacco abuse 08/09/2015  . CKD (chronic kidney disease) 04/17/2013  . DM2 (diabetes mellitus, type 2) ([place]) 04/17/2013  . HTN (hypertension) 04/17/2013  . Osteomyelitis of toe of left foot ([place]) 04/17/2013    Current Outpatient Medications on File Prior to Visit  Medication Sig Dispense Refill  . amitriptyline (ELAVIL) 25 MG tablet Take by mouth.    Marland Kitchen amLODipine (NORVASC) 10 MG tablet     . aspirin 81 MG chewable tablet Chew by mouth.    Marland Kitchen atorvastatin (LIPITOR) 80 MG tablet     . Blood Glucose Monitoring Suppl (TRUE METRIX METER) w/Device KIT   0  . docusate sodium (COLACE) 100 MG capsule Take 1 capsule (100 mg total) by mouth daily as needed. 30 capsule 2  . glipiZIDE (GLUCOTROL) 10 MG tablet   1  . glucose blood (CONTOUR TEST) test strip Use 2 (two) times daily.    . hydrochlorothiazide (HYDRODIURIL) 12.5 MG tablet Take 12.5 mg by mouth daily.    Marland Kitchen  ibuprofen (ADVIL) 800 MG tablet Take 1 tablet (800 mg total) by mouth every 8 (eight) hours as needed. 30 tablet 0  . insulin aspart (NOVOLOG FLEXPEN) 100 UNIT/ML FlexPen 3 (three) times a day with meals    . Lancets (ONETOUCH ULTRASOFT) lancets by Does not apply route.    Marland Kitchen losartan (COZAAR) 100 MG tablet Take by mouth.    . promethazine (PHENERGAN) 12.5 MG tablet Take 1 tablet (12.5 mg total) by mouth every 6 (six) hours as needed for nausea or vomiting. 30 tablet 0  . sodium bicarbonate 650 MG tablet Take by mouth.    . tadalafil (CIALIS) 20 MG tablet     . TRULICITY 9.41 DE/0.8XK SOPN     . ULTICARE MINI PEN NEEDLES 31G X 6 MM MISC   0  . UNABLE TO FIND Diabetic Supplies:  Glucometer, test strips, lancets. Check glucose BID (fasting and 2 hours after eating). One month supply. Refills PRN.    . metFORMIN (GLUCOPHAGE) 500 MG tablet Take by mouth.     No current facility-administered medications on file prior to visit.    No Known Allergies  Objective: There were no vitals filed for this visit.  General: No acute distress, AAOx3  Left foot: Sutures intact with no gapping or dehiscence at surgical site, wound graft incorporating well at plantar left foot, wound measures 2x1cm, mild swelling to left foot, no erythema, no warmth, no drainage, no signs of infection noted, Capillary fill time <3 seconds in all remaining digits.  Hx of previous toe amputations.  No pain with calf compression.   Assessment and Plan:  Problem List Items Addressed This Visit      Endocrine   Type 2 diabetes mellitus with peripheral neuropathy (HCC)   Relevant Medications   amitriptyline (ELAVIL) 25 MG tablet   Diabetic foot ulcer ([place])    Other Visit Diagnoses    Surgery follow-up    -  Primary   Relevant Orders   DG Foot Complete Left      -Patient seen and evaluated -xrays reviewed consistent with post op status  -Applied adaptic and dry sterile dressing to surgical site left foot secured with ACE  wrap and stockinet; graft site is incorporating well -Advised patient to make sure to keep dressings clean, dry, and intact to left surgical site -Advised patient to continue with post-op wedge shoe on left   -Advised patient to limit activity to necessity  -Advised patient to ice and elevate as necessary  -Will plan for wound check and possible suture removal at next office visit. In the meantime, patient to call office if any issues or problems arise.   [name], DPM

## 2019-12-23 ENCOUNTER — Ambulatory Visit (INDEPENDENT_AMBULATORY_CARE_PROVIDER_SITE_OTHER): Payer: Medicare HMO | Admitting: Sports Medicine

## 2019-12-23 ENCOUNTER — Other Ambulatory Visit: Payer: Self-pay

## 2019-12-23 ENCOUNTER — Encounter: Payer: Self-pay | Admitting: Sports Medicine

## 2019-12-23 VITALS — Temp 95.3°F

## 2019-12-23 DIAGNOSIS — Z09 Encounter for follow-up examination after completed treatment for conditions other than malignant neoplasm: Secondary | ICD-10-CM

## 2019-12-23 DIAGNOSIS — L97521 Non-pressure chronic ulcer of other part of left foot limited to breakdown of skin: Secondary | ICD-10-CM

## 2019-12-23 DIAGNOSIS — E1142 Type 2 diabetes mellitus with diabetic polyneuropathy: Secondary | ICD-10-CM

## 2019-12-23 DIAGNOSIS — E08621 Diabetes mellitus due to underlying condition with foot ulcer: Secondary | ICD-10-CM

## 2019-12-23 NOTE — Progress Notes (Signed)
Subjective: [name] is a 69 y.o. male patient seen today in office for POV #2 (DOS 12-08-19), S/P left 3rd met head resection, wound debridement and application of Stravix graft. Patient denies pain at surgical site, reports that he is doing good, denies calf pain, denies headache, chest pain, shortness of breath, nausea, vomiting, fever, or chills. No other issues noted.   Patient Active Problem List   Diagnosis Date Noted  . Diabetic foot ulcer ([place]) 10/07/2019  . Benign hypertensive kidney disease with chronic kidney disease 06/17/2019  . Proteinuria 06/17/2019  . History of amputation of lesser toe of left foot ([place]) 08/18/2015  . Hyperlipidemia, mixed 08/18/2015  . Type 2 diabetes mellitus with peripheral neuropathy ([place]) 08/18/2015  . Acute CVA (cerebrovascular accident) ([place]) 08/09/2015  . Acute renal failure superimposed on stage 3 chronic kidney disease ([place]) 08/09/2015  . Essential hypertriglyceridemia 08/09/2015  . Hypertension, essential 08/09/2015  . Right-sided muscle weakness 08/09/2015  . T2DM (type 2 diabetes mellitus) ([place]) 08/09/2015  . Tobacco abuse 08/09/2015  . CKD (chronic kidney disease) 04/17/2013  . DM2 (diabetes mellitus, type 2) ([place]) 04/17/2013  . HTN (hypertension) 04/17/2013  . Osteomyelitis of toe of left foot ([place]) 04/17/2013    Current Outpatient Medications on File Prior to Visit  Medication Sig Dispense Refill  . amitriptyline (ELAVIL) 25 MG tablet Take by mouth.    Marland Kitchen amLODipine (NORVASC) 10 MG tablet     . aspirin 81 MG chewable tablet Chew by mouth.    Marland Kitchen atorvastatin (LIPITOR) 80 MG tablet     . Blood Glucose Monitoring Suppl (TRUE METRIX METER) w/Device KIT   0  . docusate sodium (COLACE) 100 MG capsule Take 1 capsule (100 mg total) by mouth daily as needed. 30 capsule 2  . glipiZIDE (GLUCOTROL) 10 MG tablet   1  . glucose blood (CONTOUR TEST) test strip Use 2 (two) times daily.    . hydrochlorothiazide (HYDRODIURIL) 12.5 MG tablet Take  12.5 mg by mouth daily.    Marland Kitchen ibuprofen (ADVIL) 800 MG tablet Take 1 tablet (800 mg total) by mouth every 8 (eight) hours as needed. 30 tablet 0  . insulin aspart (NOVOLOG FLEXPEN) 100 UNIT/ML FlexPen 3 (three) times a day with meals    . Lancets (ONETOUCH ULTRASOFT) lancets by Does not apply route.    Marland Kitchen losartan (COZAAR) 100 MG tablet Take by mouth.    Marland Kitchen NOVOLOG MIX 70/30 FLEXPEN (70-30) 100 UNIT/ML FlexPen     . promethazine (PHENERGAN) 12.5 MG tablet Take 1 tablet (12.5 mg total) by mouth every 6 (six) hours as needed for nausea or vomiting. 30 tablet 0  . sodium bicarbonate 650 MG tablet Take by mouth.    . tadalafil (CIALIS) 20 MG tablet     . TRULICITY 7.06 CB/7.6EG SOPN     . ULTICARE MINI PEN NEEDLES 31G X 6 MM MISC   0  . UNABLE TO FIND Diabetic Supplies:  Glucometer, test strips, lancets. Check glucose BID (fasting and 2 hours after eating). One month supply. Refills PRN.    . metFORMIN (GLUCOPHAGE) 500 MG tablet Take by mouth.     No current facility-administered medications on file prior to visit.    No Known Allergies  Objective: There were no vitals filed for this visit.  General: No acute distress, AAOx3  Left foot: Sutures intact with no gapping or dehiscence at surgical site, wound graft incorporating well at plantar left foot, wound measures 1.5x0.4x0.1cm, mild swelling to left foot, no  erythema, no warmth, no drainage, no signs of infection noted, Capillary fill time <3 seconds in all remaining digits. Hx of previous toe amputations.  No pain with calf compression.   Assessment and Plan:  Problem List Items Addressed This Visit      Endocrine   Type 2 diabetes mellitus with peripheral neuropathy (HCC)   Relevant Medications   NOVOLOG MIX 70/30 FLEXPEN (70-30) 100 UNIT/ML FlexPen   Diabetic foot ulcer (HCC)   Relevant Medications   NOVOLOG MIX 70/30 FLEXPEN (70-30) 100 UNIT/ML FlexPen    Other Visit Diagnoses    Surgery follow-up    -  Primary      -Patient  seen and evaluated -Re-applied adaptic and dry sterile dressing to surgical site left foot secured with ACE wrap and stockinet; graft site is incorporating well -Advised patient to make sure to keep dressings clean, dry, and intact to left surgical site -Advised patient to continue with post-op wedge shoe on left   -Advised patient to limit activity to necessity  -Advised patient to ice and elevate as necessary  -Will plan for wound check and  suture removal at next office visit. In the meantime, patient to call office if any issues or problems arise.   [name], DPM

## 2019-12-30 ENCOUNTER — Other Ambulatory Visit: Payer: Self-pay

## 2019-12-30 ENCOUNTER — Telehealth: Payer: Self-pay | Admitting: *Deleted

## 2019-12-30 ENCOUNTER — Ambulatory Visit (INDEPENDENT_AMBULATORY_CARE_PROVIDER_SITE_OTHER): Payer: Medicare HMO | Admitting: Sports Medicine

## 2019-12-30 ENCOUNTER — Ambulatory Visit: Payer: Medicare HMO

## 2019-12-30 ENCOUNTER — Encounter: Payer: Self-pay | Admitting: Sports Medicine

## 2019-12-30 VITALS — Temp 98.2°F

## 2019-12-30 DIAGNOSIS — E1142 Type 2 diabetes mellitus with diabetic polyneuropathy: Secondary | ICD-10-CM

## 2019-12-30 DIAGNOSIS — L97521 Non-pressure chronic ulcer of other part of left foot limited to breakdown of skin: Secondary | ICD-10-CM

## 2019-12-30 DIAGNOSIS — S98132A Complete traumatic amputation of one left lesser toe, initial encounter: Secondary | ICD-10-CM

## 2019-12-30 DIAGNOSIS — Z09 Encounter for follow-up examination after completed treatment for conditions other than malignant neoplasm: Secondary | ICD-10-CM

## 2019-12-30 DIAGNOSIS — E08621 Diabetes mellitus due to underlying condition with foot ulcer: Secondary | ICD-10-CM

## 2019-12-30 DIAGNOSIS — M216X2 Other acquired deformities of left foot: Secondary | ICD-10-CM

## 2019-12-30 NOTE — Telephone Encounter (Signed)
I spoke with pt and informed that in today's clinical note Dr. Cannon Kettle had stated she would like the dressing to stay clean, dry and intact. Pt stated Dr. Cannon Kettle told him to change the dressing in one week and he wanted to know how to change the dressing because it often stuck to the wound site. I told pt I would check on his wound orders from Dr. Cannon Kettle and call again.

## 2019-12-30 NOTE — Telephone Encounter (Signed)
Pt states he was seen today and his left foot was wrapped how is he to wrap the foot.

## 2019-12-30 NOTE — Telephone Encounter (Signed)
I informed pt of Dr. Leeanne Rio 12/30/2019 4:53pm orders.

## 2019-12-30 NOTE — Telephone Encounter (Signed)
Wife to change next Tuesday, remove dressing, cleanse with saline or wound spray and then apply adaptic on the bottom covered with 4x4 guaze, kerlix, and ace wrap that I gave to the patient when he was in office this morning -Dr. Chauncey Cruel

## 2019-12-30 NOTE — Progress Notes (Signed)
Subjective: [name] is a 69 y.o. 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 Patient denies pain at surgical site, reports that he is doing good with no issues, denies calf pain, denies headache, chest pain, shortness of breath, nausea, vomiting, fever, or chills. No other issues noted.  FBS 130's   Patient Active Problem List   Diagnosis Date Noted  . Diabetic foot ulcer ([place]) 10/07/2019  . Benign hypertensive kidney disease with chronic kidney disease 06/17/2019  . Proteinuria 06/17/2019  . History of amputation of lesser toe of left foot ([place]) 08/18/2015  . Hyperlipidemia, mixed 08/18/2015  . Type 2 diabetes mellitus with peripheral neuropathy ([place]) 08/18/2015  . Acute CVA (cerebrovascular accident) ([place]) 08/09/2015  . Acute renal failure superimposed on stage 3 chronic kidney disease ([place]) 08/09/2015  . Essential hypertriglyceridemia 08/09/2015  . Hypertension, essential 08/09/2015  . Right-sided muscle weakness 08/09/2015  . T2DM (type 2 diabetes mellitus) ([place]) 08/09/2015  . Tobacco abuse 08/09/2015  . CKD (chronic kidney disease) 04/17/2013  . DM2 (diabetes mellitus, type 2) ([place]) 04/17/2013  . HTN (hypertension) 04/17/2013  . Osteomyelitis of toe of left foot ([place]) 04/17/2013    Current Outpatient Medications on File Prior to Visit  Medication Sig Dispense Refill  . amitriptyline (ELAVIL) 25 MG tablet Take by mouth.    Marland Kitchen amLODipine (NORVASC) 10 MG tablet     . aspirin 81 MG chewable tablet Chew by mouth.    Marland Kitchen atorvastatin (LIPITOR) 80 MG tablet     . Blood Glucose Monitoring Suppl (TRUE METRIX METER) w/Device KIT   0  . docusate sodium (COLACE) 100 MG capsule Take 1 capsule (100 mg total) by mouth daily as needed. 30 capsule 2  . glipiZIDE (GLUCOTROL) 10 MG tablet   1  . glucose blood (CONTOUR TEST) test strip Use 2 (two) times daily.    . hydrochlorothiazide  (HYDRODIURIL) 12.5 MG tablet Take 12.5 mg by mouth daily.    Marland Kitchen ibuprofen (ADVIL) 800 MG tablet Take 1 tablet (800 mg total) by mouth every 8 (eight) hours as needed. 30 tablet 0  . insulin aspart (NOVOLOG FLEXPEN) 100 UNIT/ML FlexPen 3 (three) times a day with meals    . Lancets (ONETOUCH ULTRASOFT) lancets by Does not apply route.    Marland Kitchen losartan (COZAAR) 100 MG tablet Take by mouth.    . metFORMIN (GLUCOPHAGE) 500 MG tablet Take by mouth.    Marland Kitchen NOVOLOG MIX 70/30 FLEXPEN (70-30) 100 UNIT/ML FlexPen     . promethazine (PHENERGAN) 12.5 MG tablet Take 1 tablet (12.5 mg total) by mouth every 6 (six) hours as needed for nausea or vomiting. 30 tablet 0  . sodium bicarbonate 650 MG tablet Take by mouth.    . tadalafil (CIALIS) 20 MG tablet     . TRULICITY 0.17 PZ/0.2HE SOPN     . ULTICARE MINI PEN NEEDLES 31G X 6 MM MISC   0  . UNABLE TO FIND Diabetic Supplies:  Glucometer, test strips, lancets. Check glucose BID (fasting and 2 hours after eating). One month supply. Refills PRN.     No current facility-administered medications on file prior to visit.    No Known Allergies  Objective: There were no vitals filed for this visit.  General: No acute distress, AAOx3  Left foot: Sutures intact with no gapping or dehiscence at surgical site, wound graft incorporating well at plantar left foot, wound measures 1.2x0.3x0.1cm,  mild swelling to left foot, no erythema, no warmth, no drainage, no signs of infection noted, Capillary fill time <3 seconds in all remaining digits. Hx of previous toe amputations.  No pain with calf compression.   Assessment and Plan:  Problem List Items Addressed This Visit      Endocrine   Type 2 diabetes mellitus with peripheral neuropathy (HCC)   Diabetic foot ulcer ([place]) - Primary    Other Visit Diagnoses    Surgery follow-up       Prominent metatarsal head of left foot       Amputated toe of left foot ([place])          -Patient seen and evaluated -Sutures  removed -Re-applied adaptic and dry sterile dressing to surgical site left foot secured with ACE wrap and stockinet; graft site is incorporating well -Advised patient to make sure to keep dressings clean, dry, and intact to left surgical site and may change using supplied that I provided on next week with assistance from his wife -Advised patient to continue with post-op wedge shoe on left   -Advised patient to limit activity to necessity  -Advised patient to ice and elevate as necessary  -Will plan for wound check and xrays at next office visit. In the meantime, patient to call office if any issues or problems arise.   [name], DPM

## 2020-01-06 ENCOUNTER — Encounter (INDEPENDENT_AMBULATORY_CARE_PROVIDER_SITE_OTHER): Payer: Self-pay | Admitting: *Deleted

## 2020-01-13 ENCOUNTER — Ambulatory Visit (INDEPENDENT_AMBULATORY_CARE_PROVIDER_SITE_OTHER): Payer: Medicare HMO | Admitting: Sports Medicine

## 2020-01-13 ENCOUNTER — Other Ambulatory Visit: Payer: Self-pay

## 2020-01-13 ENCOUNTER — Encounter: Payer: Self-pay | Admitting: Sports Medicine

## 2020-01-13 ENCOUNTER — Ambulatory Visit (INDEPENDENT_AMBULATORY_CARE_PROVIDER_SITE_OTHER): Payer: Medicare HMO

## 2020-01-13 VITALS — Temp 98.2°F

## 2020-01-13 DIAGNOSIS — E08621 Diabetes mellitus due to underlying condition with foot ulcer: Secondary | ICD-10-CM

## 2020-01-13 DIAGNOSIS — M216X2 Other acquired deformities of left foot: Secondary | ICD-10-CM

## 2020-01-13 DIAGNOSIS — E1142 Type 2 diabetes mellitus with diabetic polyneuropathy: Secondary | ICD-10-CM

## 2020-01-13 DIAGNOSIS — M79672 Pain in left foot: Secondary | ICD-10-CM

## 2020-01-13 DIAGNOSIS — S98132A Complete traumatic amputation of one left lesser toe, initial encounter: Secondary | ICD-10-CM

## 2020-01-13 DIAGNOSIS — Z09 Encounter for follow-up examination after completed treatment for conditions other than malignant neoplasm: Secondary | ICD-10-CM

## 2020-01-13 DIAGNOSIS — L97521 Non-pressure chronic ulcer of other part of left foot limited to breakdown of skin: Secondary | ICD-10-CM

## 2020-01-13 NOTE — Progress Notes (Signed)
Subjective: [name] is a 69 y.o. 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 Patient denies pain at surgical site, except his normal neuropathy pain, denies chest pain, shortness of breath, nausea, vomiting, fever, or chills. Reports that his wife changed the dressing last week with no problems. No other issues noted.  FBS not recorded but last A1c per patient around ~8.   Patient Active Problem List   Diagnosis Date Noted  . Diabetic foot ulcer ([place]) 10/07/2019  . Benign hypertensive kidney disease with chronic kidney disease 06/17/2019  . Proteinuria 06/17/2019  . Stage 3b chronic kidney disease 06/17/2019  . History of amputation of lesser toe of left foot ([place]) 08/18/2015  . Hyperlipidemia, mixed 08/18/2015  . Type 2 diabetes mellitus with peripheral neuropathy ([place]) 08/18/2015  . Acute CVA (cerebrovascular accident) ([place]) 08/09/2015  . Acute renal failure superimposed on stage 3 chronic kidney disease ([place]) 08/09/2015  . Essential hypertriglyceridemia 08/09/2015  . Hypertension, essential 08/09/2015  . Right-sided muscle weakness 08/09/2015  . T2DM (type 2 diabetes mellitus) ([place]) 08/09/2015  . Tobacco abuse 08/09/2015  . CKD (chronic kidney disease) 04/17/2013  . DM2 (diabetes mellitus, type 2) ([place]) 04/17/2013  . HTN (hypertension) 04/17/2013  . Osteomyelitis of toe of left foot ([place]) 04/17/2013    Current Outpatient Medications on File Prior to Visit  Medication Sig Dispense Refill  . amitriptyline (ELAVIL) 25 MG tablet Take by mouth.    Marland Kitchen amLODipine (NORVASC) 10 MG tablet     . aspirin 81 MG chewable tablet Chew by mouth.    Marland Kitchen atorvastatin (LIPITOR) 80 MG tablet     . Blood Glucose Monitoring Suppl (TRUE METRIX METER) w/Device KIT   0  . glipiZIDE (GLUCOTROL) 10 MG tablet   1  . glucose blood (CONTOUR TEST) test strip Use 2 (two) times daily.    .  hydrochlorothiazide (HYDRODIURIL) 12.5 MG tablet Take 12.5 mg by mouth daily.    Marland Kitchen ibuprofen (ADVIL) 800 MG tablet Take 1 tablet (800 mg total) by mouth every 8 (eight) hours as needed. 30 tablet 0  . insulin aspart (NOVOLOG FLEXPEN) 100 UNIT/ML FlexPen 3 (three) times a day with meals    . Lancets (ONETOUCH ULTRASOFT) lancets by Does not apply route.    Marland Kitchen losartan (COZAAR) 100 MG tablet Take by mouth.    Marland Kitchen NOVOLOG MIX 70/30 FLEXPEN (70-30) 100 UNIT/ML FlexPen     . sodium bicarbonate 650 MG tablet Take by mouth.    . tadalafil (CIALIS) 20 MG tablet     . TRULICITY 0.81 KG/8.1EH SOPN     . ULTICARE MINI PEN NEEDLES 31G X 6 MM MISC   0  . UNABLE TO FIND Diabetic Supplies:  Glucometer, test strips, lancets. Check glucose BID (fasting and 2 hours after eating). One month supply. Refills PRN.    . metFORMIN (GLUCOPHAGE) 500 MG tablet Take by mouth.     No current facility-administered medications on file prior to visit.    No Known Allergies  Objective: There were no vitals filed for this visit.  General: No acute distress, AAOx3  Left foot: Graft incorporated at plantar left foot, wound measures 0.2x0.1x0.1cm, mild swelling to left foot, no erythema, no warmth, no drainage, no signs of infection noted, Capillary fill time <3 seconds in all remaining digits. Hx of previous toe amputations.  No pain with calf compression.   Xrays consistent with post  op status 3rd met head resection and history of previous digital amputation   Assessment and Plan:  Problem List Items Addressed This Visit      Endocrine   Type 2 diabetes mellitus with peripheral neuropathy (HCC)   Diabetic foot ulcer ([place]) - Primary   Relevant Orders   DG Foot Complete Left (Completed)    Other Visit Diagnoses    Amputated toe of left foot ([place])       Surgery follow-up       Prominent metatarsal head of left foot       Left foot pain          -Patient seen and evaluated -xrays reviewed  -Re-applied adaptic and  dry sterile dressing to surgical site left foot secured with ACE wrap and stockinet; graft site is completely incorporated  -Advised patient to make sure to keep dressings clean, dry, and intact to left surgical site and may change using supplied that I provided on next week with assistance from his wife like before  -Advised patient to continue with post-op wedge shoe on left   -Advised patient to limit activity to necessity  -Advised patient to ice and elevate as necessary  -Will plan for wound check at next office visit. In the meantime, patient to call office if any issues or problems arise.   [name], DPM

## 2020-01-20 ENCOUNTER — Encounter: Payer: Medicare HMO | Admitting: Sports Medicine

## 2020-01-27 ENCOUNTER — Ambulatory Visit (INDEPENDENT_AMBULATORY_CARE_PROVIDER_SITE_OTHER): Payer: Medicare HMO | Admitting: Sports Medicine

## 2020-01-27 ENCOUNTER — Other Ambulatory Visit: Payer: Self-pay

## 2020-01-27 ENCOUNTER — Encounter: Payer: Self-pay | Admitting: Sports Medicine

## 2020-01-27 VITALS — Temp 98.0°F

## 2020-01-27 DIAGNOSIS — E1142 Type 2 diabetes mellitus with diabetic polyneuropathy: Secondary | ICD-10-CM

## 2020-01-27 DIAGNOSIS — Z09 Encounter for follow-up examination after completed treatment for conditions other than malignant neoplasm: Secondary | ICD-10-CM

## 2020-01-27 DIAGNOSIS — L97521 Non-pressure chronic ulcer of other part of left foot limited to breakdown of skin: Secondary | ICD-10-CM

## 2020-01-27 DIAGNOSIS — E08621 Diabetes mellitus due to underlying condition with foot ulcer: Secondary | ICD-10-CM

## 2020-01-27 DIAGNOSIS — S98132A Complete traumatic amputation of one left lesser toe, initial encounter: Secondary | ICD-10-CM

## 2020-01-27 NOTE — Progress Notes (Signed)
Subjective: [name] is a 69 y.o. 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 Patient denies pain at surgical site and states that he is doing good, denies chest pain, shortness of breath, nausea, vomiting, fever, or chills. Reports that his wife changed the dressing last week with no problems. No other issues noted.  FBS 170 but last A1c per patient around ~8.   Patient Active Problem List   Diagnosis Date Noted  . Diabetic foot ulcer ([place]) 10/07/2019  . Benign hypertensive kidney disease with chronic kidney disease 06/17/2019  . Proteinuria 06/17/2019  . Stage 3b chronic kidney disease 06/17/2019  . History of amputation of lesser toe of left foot ([place]) 08/18/2015  . Hyperlipidemia, mixed 08/18/2015  . Type 2 diabetes mellitus with peripheral neuropathy ([place]) 08/18/2015  . Acute CVA (cerebrovascular accident) ([name]) 08/09/2015  . Acute renal failure superimposed on stage 3 chronic kidney disease ([place]) 08/09/2015  . Essential hypertriglyceridemia 08/09/2015  . Hypertension, essential 08/09/2015  . Right-sided muscle weakness 08/09/2015  . T2DM (type 2 diabetes mellitus) ([place]) 08/09/2015  . Tobacco abuse 08/09/2015  . CKD (chronic kidney disease) 04/17/2013  . DM2 (diabetes mellitus, type 2) ([place]) 04/17/2013  . HTN (hypertension) 04/17/2013  . Osteomyelitis of toe of left foot ([place]) 04/17/2013    Current Outpatient Medications on File Prior to Visit  Medication Sig Dispense Refill  . amLODipine (NORVASC) 10 MG tablet     . aspirin 81 MG chewable tablet Chew by mouth.    Marland Kitchen atorvastatin (LIPITOR) 80 MG tablet     . Blood Glucose Monitoring Suppl (TRUE METRIX METER) w/Device KIT   0  . glipiZIDE (GLUCOTROL) 10 MG tablet   1  . glucose blood (CONTOUR TEST) test strip Use 2 (two) times daily.    . hydrochlorothiazide (HYDRODIURIL) 12.5 MG tablet Take 12.5 mg by mouth daily.    Marland Kitchen  ibuprofen (ADVIL) 800 MG tablet Take 1 tablet (800 mg total) by mouth every 8 (eight) hours as needed. 30 tablet 0  . insulin aspart (NOVOLOG FLEXPEN) 100 UNIT/ML FlexPen 3 (three) times a day with meals    . Lancets (ONETOUCH ULTRASOFT) lancets by Does not apply route.    Marland Kitchen losartan (COZAAR) 100 MG tablet Take by mouth.    Marland Kitchen NOVOLOG MIX 70/30 FLEXPEN (70-30) 100 UNIT/ML FlexPen     . sodium bicarbonate 650 MG tablet Take by mouth.    . tadalafil (CIALIS) 20 MG tablet     . TRULICITY 5.46 FK/8.1EX SOPN     . ULTICARE MINI PEN NEEDLES 31G X 6 MM MISC   0  . UNABLE TO FIND Diabetic Supplies:  Glucometer, test strips, lancets. Check glucose BID (fasting and 2 hours after eating). One month supply. Refills PRN.    Marland Kitchen amitriptyline (ELAVIL) 25 MG tablet Take by mouth.    . metFORMIN (GLUCOPHAGE) 500 MG tablet Take by mouth.     No current facility-administered medications on file prior to visit.    No Known Allergies  Objective: There were no vitals filed for this visit.  General: No acute distress, AAOx3  Left foot: Graft incorporated at plantar left foot, wound now healed, no erythema, no warmth, no drainage, no signs of infection noted, Capillary fill time <3 seconds in all remaining digits. Hx of previous toe amputations.  No pain with calf compression.   Assessment and Plan:  Problem List Items Addressed  This Visit      Endocrine   Type 2 diabetes mellitus with peripheral neuropathy (HCC)   Diabetic foot ulcer ([place]) - Primary    Other Visit Diagnoses    Amputated toe of left foot [facility])       Surgery follow-up          -Patient seen and evaluated -Foot ulcer is healed -May d/c dressings and shower starting tomorrow -Advised patient to slowly transition to diabetic shoe and insole -Advised patient to limit activity to necessity  -Will plan for final wound check at next office visit. In the meantime, patient to call office if any issues or problems arise.   [name],  DPM

## 2020-02-17 ENCOUNTER — Other Ambulatory Visit: Payer: Self-pay

## 2020-02-17 ENCOUNTER — Encounter: Payer: Self-pay | Admitting: Sports Medicine

## 2020-02-17 ENCOUNTER — Ambulatory Visit (INDEPENDENT_AMBULATORY_CARE_PROVIDER_SITE_OTHER): Payer: Medicare HMO | Admitting: Sports Medicine

## 2020-02-17 VITALS — Temp 98.2°F

## 2020-02-17 DIAGNOSIS — S98132A Complete traumatic amputation of one left lesser toe, initial encounter: Secondary | ICD-10-CM

## 2020-02-17 DIAGNOSIS — L97521 Non-pressure chronic ulcer of other part of left foot limited to breakdown of skin: Secondary | ICD-10-CM

## 2020-02-17 DIAGNOSIS — Z09 Encounter for follow-up examination after completed treatment for conditions other than malignant neoplasm: Secondary | ICD-10-CM

## 2020-02-17 DIAGNOSIS — E08621 Diabetes mellitus due to underlying condition with foot ulcer: Secondary | ICD-10-CM

## 2020-02-17 DIAGNOSIS — E1142 Type 2 diabetes mellitus with diabetic polyneuropathy: Secondary | ICD-10-CM

## 2020-02-17 NOTE — Progress Notes (Signed)
Subjective: [name] is a 69 y.o. 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 Patient denies pain at surgical site and reports that he is doing good in a normal shoe. No other issues noted.  FBS 200 but last A1c per patient around ~8.  Reports that his doctor is working on putting him on a different insulin to help get his sugars down.   Patient Active Problem List   Diagnosis Date Noted  . Diabetic foot ulcer ([place]) 10/07/2019  . Benign hypertensive kidney disease with chronic kidney disease 06/17/2019  . Proteinuria 06/17/2019  . Stage 3b chronic kidney disease 06/17/2019  . History of amputation of lesser toe of left foot ([place]) 08/18/2015  . Hyperlipidemia, mixed 08/18/2015  . Type 2 diabetes mellitus with peripheral neuropathy ([place]) 08/18/2015  . Acute CVA (cerebrovascular accident) ([place]) 08/09/2015  . Acute renal failure superimposed on stage 3 chronic kidney disease ([place]) 08/09/2015  . Essential hypertriglyceridemia 08/09/2015  . Hypertension, essential 08/09/2015  . Right-sided muscle weakness 08/09/2015  . T2DM (type 2 diabetes mellitus) ([place]) 08/09/2015  . Tobacco abuse 08/09/2015  . CKD (chronic kidney disease) 04/17/2013  . DM2 (diabetes mellitus, type 2) ([place]) 04/17/2013  . HTN (hypertension) 04/17/2013  . Osteomyelitis of toe of left foot ([place]) 04/17/2013    Current Outpatient Medications on File Prior to Visit  Medication Sig Dispense Refill  . amLODipine (NORVASC) 10 MG tablet     . aspirin 81 MG chewable tablet Chew by mouth.    Marland Kitchen atorvastatin (LIPITOR) 80 MG tablet     . Blood Glucose Monitoring Suppl (TRUE METRIX METER) w/Device KIT   0  . glipiZIDE (GLUCOTROL) 5 MG tablet Take 5 mg by mouth 2 (two) times daily.    Marland Kitchen glucose blood (CONTOUR TEST) test strip Use 2 (two) times daily.    . hydrochlorothiazide (HYDRODIURIL) 12.5 MG tablet Take 12.5 mg by mouth  daily.    Marland Kitchen ibuprofen (ADVIL) 800 MG tablet Take 1 tablet (800 mg total) by mouth every 8 (eight) hours as needed. 30 tablet 0  . insulin aspart (NOVOLOG FLEXPEN) 100 UNIT/ML FlexPen 3 (three) times a day with meals    . Lancets (ONETOUCH ULTRASOFT) lancets by Does not apply route.    Marland Kitchen losartan (COZAAR) 100 MG tablet Take by mouth.    Marland Kitchen NOVOLOG MIX 70/30 FLEXPEN (70-30) 100 UNIT/ML FlexPen     . sodium bicarbonate 650 MG tablet Take by mouth.    . tadalafil (CIALIS) 20 MG tablet     . TRULICITY 1.5 PY/0.9XI SOPN Inject 1.5 mg into the skin once a week.    Marland Kitchen ULTICARE MINI PEN NEEDLES 31G X 6 MM MISC   0  . UNABLE TO FIND Diabetic Supplies:  Glucometer, test strips, lancets. Check glucose BID (fasting and 2 hours after eating). One month supply. Refills PRN.    Marland Kitchen amitriptyline (ELAVIL) 25 MG tablet Take by mouth.    . metFORMIN (GLUCOPHAGE) 500 MG tablet Take by mouth.     No current facility-administered medications on file prior to visit.    No Known Allergies  Objective: There were no vitals filed for this visit.  General: No acute distress, AAOx3  Left foot: Plantar wound healed, mild reactive keratosis.  Capillary fill time <3 seconds in all remaining digits. Hx of previous toe amputations.  No pain with calf compression.  Nails bilateral mildly elongated  and thickened.  Assessment and Plan:  Problem List Items Addressed This Visit      Endocrine   Type 2 diabetes mellitus with peripheral neuropathy (HCC)   Relevant Medications   TRULICITY 1.5 VP/3.6UZ SOPN   glipiZIDE (GLUCOTROL) 5 MG tablet   Diabetic foot ulcer ([place]) - Primary   Relevant Medications   TRULICITY 1.5 RV/2.3CZ SOPN   glipiZIDE (GLUCOTROL) 5 MG tablet    Other Visit Diagnoses    Amputated toe of left foot [facility])       Surgery follow-up           -Patient seen and evaluated -At no charge today mechanically debrided all nails using a sterile nail nipper bilateral without incident -Left foot ulcer  remains healed -Continue with diabetic shoes and offloading padding as I applied to his left shoe insole this visit -Advised patient if he notices any bleeding or reoccurrence of swelling or pain or drainage to return to office -Patient to return in 3 months for diabetic nail care and for follow-up on left foot to make sure previous ulcer remains healed  [name], DPM

## 2020-02-25 DIAGNOSIS — N2581 Secondary hyperparathyroidism of renal origin: Secondary | ICD-10-CM | POA: Insufficient documentation

## 2020-05-19 ENCOUNTER — Ambulatory Visit: Payer: Medicare HMO | Admitting: Podiatry

## 2020-05-27 DIAGNOSIS — N189 Chronic kidney disease, unspecified: Secondary | ICD-10-CM | POA: Insufficient documentation

## 2020-05-27 DIAGNOSIS — D631 Anemia in chronic kidney disease: Secondary | ICD-10-CM | POA: Insufficient documentation

## 2020-07-06 ENCOUNTER — Other Ambulatory Visit (INDEPENDENT_AMBULATORY_CARE_PROVIDER_SITE_OTHER): Payer: Self-pay

## 2020-07-06 DIAGNOSIS — Z1211 Encounter for screening for malignant neoplasm of colon: Secondary | ICD-10-CM

## 2020-07-12 ENCOUNTER — Telehealth (INDEPENDENT_AMBULATORY_CARE_PROVIDER_SITE_OTHER): Payer: Self-pay

## 2020-07-12 ENCOUNTER — Encounter (INDEPENDENT_AMBULATORY_CARE_PROVIDER_SITE_OTHER): Payer: Self-pay

## 2020-07-12 DIAGNOSIS — Z1211 Encounter for screening for malignant neoplasm of colon: Secondary | ICD-10-CM

## 2020-07-12 MED ORDER — PLENVU 140 G PO SOLR: 1.0000 | Freq: Once | ORAL | 0 refills | Status: AC

## 2020-07-12 NOTE — Telephone Encounter (Signed)
Patient has Plenvu (copay card) 

## 2020-07-12 NOTE — Telephone Encounter (Signed)
[name], CMA  

## 2020-07-13 ENCOUNTER — Telehealth (INDEPENDENT_AMBULATORY_CARE_PROVIDER_SITE_OTHER): Payer: Self-pay

## 2020-07-13 ENCOUNTER — Other Ambulatory Visit (INDEPENDENT_AMBULATORY_CARE_PROVIDER_SITE_OTHER): Payer: Self-pay | Admitting: *Deleted

## 2020-07-13 NOTE — Telephone Encounter (Signed)
Referring MD/PCP: Talbert Cage   Procedure: Tcs   Reason/Indication:  screening  Has patient had this procedure before?  Yes, 15 yrs ago  If so, when, by whom and where?    Is there a family history of colon cancer?  no  Who?  What age when diagnosed?    Is patient diabetic?   yes      Does patient have prosthetic heart valve or mechanical valve?  no  Do you have a pacemaker/defibrillator?  no  Has patient ever had endocarditis/atrial fibrillation? no  Does patient use oxygen? no  Has patient had joint replacement within last 12 months?  no  Is patient constipated or do they take laxatives? no  Does patient have a history of alcohol/drug use?  yes  Is patient on blood thinner such as Coumadin, Plavix and/or Aspirin? yes  Medications: hctz 12.5 mg daily, amitriptyline 25mg  daily, asa 81 mg daily, atorvastatin 80mg  daily losartan 50 mg daily, amlodipine 5 mg daily, Trulicity 3 mg, novolog 70/30  Allergies: nkda  Medication Adjustment per Dr [name]/Dr Jenetta Downer 1/2 dose of insulin morning of   Procedure date & time: 08/10/20 at 8:15

## 2020-07-13 NOTE — Telephone Encounter (Signed)
Ok to schedule.  Thanks,  Oddie Kuhlmann Castaneda Mayorga, MD Gastroenterology and Hepatology  [facility]  

## 2020-08-09 ENCOUNTER — Other Ambulatory Visit (HOSPITAL_COMMUNITY)
Admission: RE | Admit: 2020-08-09 | Discharge: 2020-08-09 | Disposition: A | Discharge status: Home / Self Care | Payer: Medicare HMO | Source: Ambulatory Visit | Attending: Gastroenterology | Admitting: Gastroenterology

## 2020-08-09 ENCOUNTER — Other Ambulatory Visit: Payer: Self-pay

## 2020-08-09 DIAGNOSIS — Z01818 Encounter for other preprocedural examination: Secondary | ICD-10-CM | POA: Insufficient documentation

## 2020-08-09 DIAGNOSIS — Z20822 Contact with and (suspected) exposure to covid-19: Secondary | ICD-10-CM | POA: Diagnosis not present

## 2020-08-09 LAB — BASIC METABOLIC PANEL
Anion gap: 10 (ref 5–15)
BUN: 56 mg/dL — ABNORMAL HIGH (ref 8–23)
CO2: 22 mmol/L (ref 22–32)
Calcium: 8.7 mg/dL — ABNORMAL LOW (ref 8.9–10.3)
Chloride: 105 mmol/L (ref 98–111)
Creatinine, Ser: 4.06 mg/dL — ABNORMAL HIGH (ref 0.61–1.24)
GFR, Estimated: 15 mL/min — ABNORMAL LOW (ref >60)
Glucose, Bld: 110 mg/dL — ABNORMAL HIGH (ref 70–99)
Potassium: 4.3 mmol/L (ref 3.5–5.1)
Sodium: 137 mmol/L (ref 135–145)

## 2020-08-09 LAB — SARS CORONAVIRUS 2 (TAT 6-24 HRS): SARS Coronavirus 2: NEGATIVE

## 2020-08-10 ENCOUNTER — Encounter (HOSPITAL_COMMUNITY): Payer: Self-pay | Admitting: Gastroenterology

## 2020-08-10 ENCOUNTER — Encounter (HOSPITAL_COMMUNITY)
Admission: RE | Disposition: A | Discharge status: Home / Self Care | Payer: Self-pay | Source: Home / Self Care | Attending: Gastroenterology

## 2020-08-10 ENCOUNTER — Ambulatory Visit (HOSPITAL_COMMUNITY): Payer: Medicare HMO | Admitting: Anesthesiology

## 2020-08-10 ENCOUNTER — Other Ambulatory Visit: Payer: Self-pay

## 2020-08-10 ENCOUNTER — Ambulatory Visit (HOSPITAL_COMMUNITY)
Admission: RE | Admit: 2020-08-10 | Discharge: 2020-08-10 | Disposition: A | Discharge status: Home / Self Care | Payer: Medicare HMO | Attending: Gastroenterology | Admitting: Gastroenterology

## 2020-08-10 DIAGNOSIS — N184 Chronic kidney disease, stage 4 (severe): Secondary | ICD-10-CM | POA: Diagnosis not present

## 2020-08-10 DIAGNOSIS — Z8673 Personal history of transient ischemic attack (TIA), and cerebral infarction without residual deficits: Secondary | ICD-10-CM | POA: Diagnosis not present

## 2020-08-10 DIAGNOSIS — Z79899 Other long term (current) drug therapy: Secondary | ICD-10-CM | POA: Insufficient documentation

## 2020-08-10 DIAGNOSIS — I129 Hypertensive chronic kidney disease with stage 1 through stage 4 chronic kidney disease, or unspecified chronic kidney disease: Secondary | ICD-10-CM | POA: Diagnosis not present

## 2020-08-10 DIAGNOSIS — D123 Benign neoplasm of transverse colon: Secondary | ICD-10-CM | POA: Insufficient documentation

## 2020-08-10 DIAGNOSIS — Z87891 Personal history of nicotine dependence: Secondary | ICD-10-CM | POA: Insufficient documentation

## 2020-08-10 DIAGNOSIS — E785 Hyperlipidemia, unspecified: Secondary | ICD-10-CM | POA: Diagnosis not present

## 2020-08-10 DIAGNOSIS — K648 Other hemorrhoids: Secondary | ICD-10-CM | POA: Diagnosis not present

## 2020-08-10 DIAGNOSIS — K219 Gastro-esophageal reflux disease without esophagitis: Secondary | ICD-10-CM | POA: Diagnosis not present

## 2020-08-10 DIAGNOSIS — Z1211 Encounter for screening for malignant neoplasm of colon: Secondary | ICD-10-CM | POA: Diagnosis not present

## 2020-08-10 DIAGNOSIS — Z7982 Long term (current) use of aspirin: Secondary | ICD-10-CM | POA: Insufficient documentation

## 2020-08-10 DIAGNOSIS — I10 Essential (primary) hypertension: Secondary | ICD-10-CM | POA: Diagnosis not present

## 2020-08-10 DIAGNOSIS — E1122 Type 2 diabetes mellitus with diabetic chronic kidney disease: Secondary | ICD-10-CM | POA: Diagnosis not present

## 2020-08-10 HISTORY — PX: COLONOSCOPY WITH PROPOFOL: SHX5780

## 2020-08-10 HISTORY — PX: BIOPSY: SHX5522

## 2020-08-10 HISTORY — PX: POLYPECTOMY: SHX5525

## 2020-08-10 LAB — GLUCOSE, CAPILLARY: Glucose-Capillary: 87 mg/dL (ref 70–99)

## 2020-08-10 SURGERY — COLONOSCOPY WITH PROPOFOL
Anesthesia: General

## 2020-08-10 MED ORDER — STERILE WATER FOR IRRIGATION IR SOLN
Status: DC | PRN
  Administered 2020-08-10: 100 mL

## 2020-08-10 MED ORDER — PROPOFOL 10 MG/ML IV BOLUS
INTRAVENOUS | Status: AC
  Filled 2020-08-10: qty 80

## 2020-08-10 MED ORDER — LACTATED RINGERS IV SOLN: Freq: Once | INTRAVENOUS | Status: AC

## 2020-08-10 MED ORDER — PHENYLEPHRINE HCL (PRESSORS) 10 MG/ML IV SOLN
INTRAVENOUS | Status: DC | PRN
  Administered 2020-08-10 (×9): 100 ug via INTRAVENOUS

## 2020-08-10 MED ORDER — PROPOFOL 10 MG/ML IV BOLUS
INTRAVENOUS | Status: DC | PRN
  Administered 2020-08-10: 50 mg via INTRAVENOUS

## 2020-08-10 MED ORDER — EPINEPHRINE PF 1 MG/ML IJ SOLN
INTRAMUSCULAR | Status: AC
  Filled 2020-08-10: qty 1

## 2020-08-10 MED ORDER — PHENYLEPHRINE 40 MCG/ML (10ML) SYRINGE FOR IV PUSH (FOR BLOOD PRESSURE SUPPORT)
PREFILLED_SYRINGE | INTRAVENOUS | Status: AC
  Filled 2020-08-10: qty 40

## 2020-08-10 MED ORDER — EPHEDRINE SULFATE 50 MG/ML IJ SOLN
INTRAMUSCULAR | Status: DC | PRN
  Administered 2020-08-10: 10 mg via INTRAVENOUS

## 2020-08-10 MED ORDER — PROPOFOL 500 MG/50ML IV EMUL
INTRAVENOUS | Status: DC | PRN
  Administered 2020-08-10: 150 ug/kg/min via INTRAVENOUS

## 2020-08-10 MED ORDER — LACTATED RINGERS IV SOLN: INTRAVENOUS | Status: DC | PRN

## 2020-08-10 MED ORDER — CHLORHEXIDINE GLUCONATE CLOTH 2 % EX PADS: 6.0000 | MEDICATED_PAD | Freq: Once | CUTANEOUS | Status: DC

## 2020-08-10 NOTE — Anesthesia Postprocedure Evaluation (Signed)
Anesthesia Post Note  Patient: [name]  Procedure(s) Performed: COLONOSCOPY WITH PROPOFOL (N/A ) POLYPECTOMY BIOPSY  Patient location during evaluation: Phase II Anesthesia Type: General Level of consciousness: awake, oriented, awake and alert and patient cooperative Pain management: satisfactory to patient Vital Signs Assessment: post-procedure vital signs reviewed and stable Respiratory status: spontaneous breathing, respiratory function stable and nonlabored ventilation Cardiovascular status: stable Postop Assessment: no apparent nausea or vomiting Anesthetic complications: no   No complications documented.   Last Vitals:  Vitals:   08/10/20 0704 08/10/20 0847  BP: (!) 154/72 (!) 107/47  Pulse:  80  Resp: 19 14  Temp: 36.7 C 36.4 C  SpO2: 96% 100%    Last Pain:  Vitals:   08/10/20 0847  TempSrc: Oral  PainSc: 0-No pain                 Meshilem Machuca

## 2020-08-10 NOTE — OR Nursing (Signed)
Dr. Charna Elizabeth asked that patient be seen by his nephrologist due to worsening kidney function labs. I called the [facility] and spoke with [facility] who said patient is already scheduled for November 17th. [place] said they would be able to pull the patient's labs from [facility].  Dr. Charna Elizabeth notified.

## 2020-08-10 NOTE — Anesthesia Preprocedure Evaluation (Addendum)
Anesthesia Evaluation  Patient identified by MRN, date of birth, ID band Patient awake    Reviewed: Allergy & Precautions, NPO status , Patient's Chart, lab work & pertinent test results  History of Anesthesia Complications Negative for: history of anesthetic complications  Airway Mallampati: III  TM Distance: >3 FB Neck ROM: Full    Dental  (+) Dental Advisory Given, Upper Dentures   Pulmonary former smoker,    Pulmonary exam normal breath sounds clear to auscultation       Cardiovascular Exercise Tolerance: Good hypertension, Pt. on medications Normal cardiovascular exam Rhythm:Regular Rate:Normal     Neuro/Psych  Neuromuscular disease CVA, Residual Symptoms negative psych ROS   GI/Hepatic GERD  Medicated and Controlled,  Endo/Other  diabetes, Well Controlled, Type 2, Insulin Dependent  Renal/GU CRF and Renal InsufficiencyRenal disease  negative genitourinary   Musculoskeletal negative musculoskeletal ROS (+)   Abdominal   Peds negative pediatric ROS (+)  Hematology negative hematology ROS (+)   Anesthesia Other Findings   Reproductive/Obstetrics                            Anesthesia Physical Anesthesia Plan  ASA: III  Anesthesia Plan: General   Post-op Pain Management:    Induction: Intravenous  PONV Risk Score and Plan: TIVA  Airway Management Planned: Nasal Cannula and Natural Airway  Additional Equipment:   Intra-op Plan:   Post-operative Plan:   Informed Consent: I have reviewed the patients History and Physical, chart, labs and discussed the procedure including the risks, benefits and alternatives for the proposed anesthesia with the patient or authorized representative who has indicated his/her understanding and acceptance.     Dental advisory given  Plan Discussed with: CRNA and Surgeon  Anesthesia Plan Comments: (Risk of kidney function is getting worse  after anesthesia was explained )       Anesthesia Quick Evaluation

## 2020-08-10 NOTE — Discharge Instructions (Signed)
Hemorrhoids Hemorrhoids are swollen veins that may develop:  In the butt (rectum). These are called internal hemorrhoids.  Around the opening of the butt (anus). These are called external hemorrhoids. Hemorrhoids can cause pain, itching, or bleeding. Most of the time, they do not cause serious problems. They usually get better with diet changes, lifestyle changes, and other home treatments. What are the causes? This condition may be caused by:  Having trouble pooping (constipation).  Pushing hard (straining) to poop.  Watery poop (diarrhea).  Pregnancy.  Being very overweight (obese).  Sitting for long periods of time.  Heavy lifting or other activity that causes you to strain.  Anal sex.  Riding a bike for a long period of time. What are the signs or symptoms? Symptoms of this condition include:  Pain.  Itching or soreness in the butt.  Bleeding from the butt.  Leaking poop.  Swelling in the area.  One or more lumps around the opening of your butt. How is this diagnosed? A doctor can often diagnose this condition by looking at the affected area. The doctor may also:  Do an exam that involves feeling the area with a gloved hand (digital rectal exam).  Examine the area inside your butt using a small tube (anoscope).  Order blood tests. This may be done if you have lost a lot of blood.  Have you get a test that involves looking inside the colon using a flexible tube with a camera on the end (sigmoidoscopy or colonoscopy). How is this treated? This condition can usually be treated at home. Your doctor may tell you to change what you eat, make lifestyle changes, or try home treatments. If these do not help, procedures can be done to remove the hemorrhoids or make them smaller. These may involve:  Placing rubber bands at the base of the hemorrhoids to cut off their blood supply.  Injecting medicine into the hemorrhoids to shrink them.  Shining a type of light  energy onto the hemorrhoids to cause them to fall off.  Doing surgery to remove the hemorrhoids or cut off their blood supply. Follow these instructions at home: Eating and drinking   Eat foods that have a lot of fiber in them. These include whole grains, beans, nuts, fruits, and vegetables.  Ask your doctor about taking products that have added fiber (fibersupplements).  Reduce the amount of fat in your diet. You can do this by: ? Eating low-fat dairy products. ? Eating less red meat. ? Avoiding processed foods.  Drink enough fluid to keep your pee (urine) pale yellow. Managing pain and swelling   Take a warm-water bath (sitz bath) for 20 minutes to ease pain. Do this 3-4 times a day. You may do this in a bathtub or using a portable sitz bath that fits over the toilet.  If told, put ice on the painful area. It may be helpful to use ice between your warm baths. ? Put ice in a plastic bag. ? Place a towel between your skin and the bag. ? Leave the ice on for 20 minutes, 2-3 times a day. General instructions  Take over-the-counter and prescription medicines only as told by your doctor. ? Medicated creams and medicines may be used as told.  Exercise often. Ask your doctor how much and what kind of exercise is best for you.  Go to the bathroom when you have the urge to poop. Do not wait.  Avoid pushing too hard when you poop.  Keep your   butt dry and clean. Use wet toilet paper or moist towelettes after pooping.  Do not sit on the toilet for a long time.  Keep all follow-up visits as told by your doctor. This is important. Contact a doctor if you:  Have pain and swelling that do not get better with treatment or medicine.  Have trouble pooping.  Cannot poop.  Have pain or swelling outside the area of the hemorrhoids. Get help right away if you have:  Bleeding that will not stop. Summary  Hemorrhoids are swollen veins in the butt or around the opening of the  butt.  They can cause pain, itching, or bleeding.  Eat foods that have a lot of fiber in them. These include whole grains, beans, nuts, fruits, and vegetables.  Take a warm-water bath (sitz bath) for 20 minutes to ease pain. Do this 3-4 times a day. This information is not intended to replace advice given to you by your health care provider. Make sure you discuss any questions you have with your health care provider. Document Revised: 09/26/2018 Document Reviewed: 02/07/2018 Elsevier Patient Education  [place]. Colonoscopy, Adult, Care After This sheet gives you information about how to care for yourself after your procedure. Your doctor may also give you more specific instructions. If you have problems or questions, call your doctor. What can I expect after the procedure? After the procedure, it is common to have:  A small amount of blood in your poop (stool) for 24 hours.  Some gas.  Mild cramping or bloating in your belly (abdomen). Follow these instructions at home: Eating and drinking   Drink enough fluid to keep your pee (urine) pale yellow.  Follow instructions from your doctor about what you cannot eat or drink.  Return to your normal diet as told by your doctor. Avoid heavy or fried foods that are hard to digest. Activity  Rest as told by your doctor.  Do not sit for a long time without moving. Get up to take short walks every 1-2 hours. This is important. Ask for help if you feel weak or unsteady.  Return to your normal activities as told by your doctor. Ask your doctor what activities are safe for you. To help cramping and bloating:   Try walking around.  Put heat on your belly as told by your doctor. Use the heat source that your doctor recommends, such as a moist heat pack or a heating pad. ? Put a towel between your skin and the heat source. ? Leave the heat on for 20-30 minutes. ? Remove the heat if your skin turns bright red. This is very  important if you are unable to feel pain, heat, or cold. You may have a greater risk of getting burned. General instructions  For the first 24 hours after the procedure: ? Do not drive or use machinery. ? Do not sign important documents. ? Do not drink alcohol. ? Do your daily activities more slowly than normal. ? Eat foods that are soft and easy to digest.  Take over-the-counter or prescription medicines only as told by your doctor.  Keep all follow-up visits as told by your doctor. This is important. Contact a doctor if:  You have blood in your poop 2-3 days after the procedure. Get help right away if:  You have more than a small amount of blood in your poop.  You see large clumps of tissue (blood clots) in your poop.  Your belly is swollen.  You  feel like you may vomit (nauseous).  You vomit.  You have a fever.  You have belly pain that gets worse, and medicine does not help your pain. Summary  After the procedure, it is common to have a small amount of blood in your poop. You may also have mild cramping and bloating in your belly.  For the first 24 hours after the procedure, do not drive or use machinery, do not sign important documents, and do not drink alcohol.  Get help right away if you have a lot of blood in your poop, feel like you may vomit, have a fever, or have more belly pain. This information is not intended to replace advice given to you by your health care provider. Make sure you discuss any questions you have with your health care provider. Document Revised: 04/14/2019 Document Reviewed: 04/14/2019 Elsevier Patient Education  [place]. Colon Polyps  Polyps are tissue growths inside the body. Polyps can grow in many places, including the large intestine (colon). A polyp may be a round bump or a mushroom-shaped growth. You could have one polyp or several. Most colon polyps are noncancerous (benign). However, some colon polyps can become cancerous  over time. Finding and removing the polyps early can help prevent this. What are the causes? The exact cause of colon polyps is not known. What increases the risk? You are more likely to develop this condition if you:  Have a family history of colon cancer or colon polyps.  Are older than 34 or older than 45 if you are African American.  Have inflammatory bowel disease, such as ulcerative colitis or Crohn's disease.  Have certain hereditary conditions, such as: ? Familial adenomatous polyposis. ? Lynch syndrome. ? Turcot syndrome. ? Peutz-Jeghers syndrome.  Are overweight.  Smoke cigarettes.  Do not get enough exercise.  Drink too much alcohol.  Eat a diet that is high in fat and red meat and low in fiber.  Had childhood cancer that was treated with abdominal radiation. What are the signs or symptoms? Most polyps do not cause symptoms. If you have symptoms, they may include:  Blood coming from your rectum when having a bowel movement.  Blood in your stool. The stool may look dark red or black.  Abdominal pain.  A change in bowel habits, such as constipation or diarrhea. How is this diagnosed? This condition is diagnosed with a colonoscopy. This is a procedure in which a lighted, flexible scope is inserted into the anus and then passed into the colon to examine the area. Polyps are sometimes found when a colonoscopy is done as part of routine cancer screening tests. How is this treated? Treatment for this condition involves removing any polyps that are found. Most polyps can be removed during a colonoscopy. Those polyps will then be tested for cancer. Additional treatment may be needed depending on the results of testing. Follow these instructions at home: Lifestyle  Maintain a healthy weight, or lose weight if recommended by your health care provider.  Exercise every day or as told by your health care provider.  Do not use any products that contain nicotine or  tobacco, such as cigarettes and e-cigarettes. If you need help quitting, ask your health care provider.  If you drink alcohol, limit how much you have: ? 0-1 drink a day for women. ? 0-2 drinks a day for men.  Be aware of how much alcohol is in your drink. In the [place] one drink equals one 12 oz  bottle of beer (355 mL), one 5 oz glass of wine (148 mL), or one 1 oz shot of hard liquor (44 mL). Eating and drinking   Eat foods that are high in fiber, such as fruits, vegetables, and whole grains.  Eat foods that are high in calcium and vitamin D, such as milk, cheese, yogurt, eggs, liver, fish, and broccoli.  Limit foods that are high in fat, such as fried foods and desserts.  Limit the amount of red meat and processed meat you eat, such as hot dogs, sausage, bacon, and lunch meats. General instructions  Keep all follow-up visits as told by your health care provider. This is important. ? This includes having regularly scheduled colonoscopies. ? Talk to your health care provider about when you need a colonoscopy. Contact a health care provider if:  You have new or worsening bleeding during a bowel movement.  You have new or increased blood in your stool.  You have a change in bowel habits.  You lose weight for no known reason. Summary  Polyps are tissue growths inside the body. Polyps can grow in many places, including the colon.  Most colon polyps are noncancerous (benign), but some can become cancerous over time.  This condition is diagnosed with a colonoscopy.  Treatment for this condition involves removing any polyps that are found. Most polyps can be removed during a colonoscopy. This information is not intended to replace advice given to you by your health care provider. Make sure you discuss any questions you have with your health care provider. Document Revised: 01/03/2018 Document Reviewed: 01/03/2018 Elsevier Patient Education  [place] previous  diet.  Await pathology results.  Repeat colonoscopy in 5 years for screening purposes.

## 2020-08-10 NOTE — Op Note (Signed)
[facility] Patient Name: [name] Procedure Date: 08/10/2020 7:27 AM MRN: [id number] Date of Birth: 04-16-1951 Attending MD: Maylon Peppers ,  CSN: [id number] Age: 69 Admit Type: Outpatient Procedure:                Colonoscopy Indications:              Screening for colorectal malignant neoplasm Providers:                Maylon Peppers, Lambert Mody Aram Candela Referring MD:              Medicines:                Monitored Anesthesia Care Complications:            No immediate complications. Estimated Blood Loss:     Estimated blood loss: none. Procedure:                Pre-Anesthesia Assessment:                           - Prior to the procedure, a History and Physical                            was performed, and patient medications, allergies                            and sensitivities were reviewed. The patient's                            tolerance of previous anesthesia was reviewed.                           - The risks and benefits of the procedure and the                            sedation options and risks were discussed with the                            patient. All questions were answered and informed                            consent was obtained.                           - ASA Grade Assessment: II - A patient with mild                            systemic disease.                           After obtaining informed consent, the colonoscope                            was passed under direct vision. Throughout the                            procedure, the patient's blood pressure, pulse,  and                            oxygen saturations were monitored continuously. The                            [id number]-[id number] ([id number]) scope was introduced through                            the anus and advanced to the the cecum, identified                            by appendiceal orifice and ileocecal valve. The                            colonoscopy was  performed without difficulty. The                            patient tolerated the procedure well. The quality                            of the bowel preparation was adequate to identify                            polyps 6 mm and larger in size. Scope withdrawal                            time was 15 minutes. Scope In: 8:15:01 AM Scope Out: 8:43:14 AM Scope Withdrawal Time: 0 hours 19 minutes 9 seconds  Total Procedure Duration: 0 hours 28 minutes 13 seconds  Findings:      The perianal and digital rectal examinations were normal.      Two sessile polyps were found in the transverse colon. The polyps were 3       to 5 mm in size. These polyps were removed with a cold snare. Resection       and retrieval were complete.      Two sessile polyps were found in the transverse colon. The polyps were 2       mm in size. These polyps were removed with a cold biopsy forceps.       Resection and retrieval were complete.      Non-bleeding internal hemorrhoids were found during retroflexion. The       hemorrhoids were small. Impression:               - Two 3 to 5 mm polyps in the transverse colon,                            removed with a cold snare. Resected and retrieved.                           - Two 2 mm polyps in the transverse colon, removed                            with a cold biopsy forceps. Resected and retrieved.                           -  Non-bleeding internal hemorrhoids. Moderate Sedation:      Per Anesthesia Care Recommendation:           - Discharge patient to home.                           - Resume previous diet.                           - Await pathology results.                           - Repeat colonoscopy in 5 years for screening                            purposes. Procedure Code(s):        --- Professional ---                           [phone], GC, Colonoscopy, flexible; with removal of                            tumor(s), polyp(s), or other lesion(s) by snare                             technique                           45380, 29, Colonoscopy, flexible; with biopsy,                            single or multiple Diagnosis Code(s):        --- Professional ---                           Z12.11, Encounter for screening for malignant                            neoplasm of colon                           K63.5, Polyp of colon                           K64.8, Other hemorrhoids CPT copyright 2019 American Medical Association. All rights reserved. The codes documented in this report are preliminary and upon coder review may  be revised to meet current compliance requirements. Maylon Peppers, MD Maylon Peppers,  08/10/2020 8:51:00 AM This report has been signed electronically. Number of Addenda: 0

## 2020-08-10 NOTE — H&P (Signed)
[name] is an 69 y.o. male.   Chief Complaint: screening colonoscopy HPI: 69 year old male with past medical history of CKD, diabetes, GERD, hyperlipidemia, hypertension, stroke, who comes to the hospital to undergo screening colonoscopy.  Patient reports that he last colonoscopy was close to 10 years ago, no polyps were found. He denies having any complaints such as melena, hematochezia, abdominal pain or distention, change in her bowel movement consistency or frequency, no changes in her weight recently.  No family history of colorectal cancer.   Past Medical History:  Diagnosis Date  . Chronic kidney disease    stage 4-Cr 3.3 11-19-19  . Diabetes mellitus without complication ([place])   . 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 GERD (gastroesophageal reflux disease)   . Hyperlipidemia   . Hypertension   . Non compliance w medication regimen   . Stroke [facility]) 2016   no deficits    Past Surgical History:  Procedure Laterality Date  . 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 APPENDECTOMY    . CHOLECYSTECTOMY    . COLON SURGERY    . GRAFT APPLICATION Left 04/09/1504   Procedure: GRAFT APPLICATION;  Surgeon: [name], DPM;  Location: [place];  Service: Podiatry;  Laterality: Left;  . HERNIA REPAIR    . METATARSAL HEAD EXCISION Left 12/08/2019   Procedure: METATARSAL HEAD RECESSION THIRD LEFT;  Surgeon: [name], DPM;  Location: [place];  Service: Podiatry;  Laterality: Left;  . WOUND DEBRIDEMENT Left 12/08/2019   Procedure: DEBRIDEMENT WOUND;  Surgeon: [name], DPM;  Location: [place];  Service: Podiatry;  Laterality: Left;    History reviewed. No pertinent family history. Social History:  reports that he has quit smoking. He has never used smokeless tobacco. He reports previous alcohol use.  He reports that he does not use drugs.  Allergies: No Known Allergies  Medications Prior to Admission  Medication Sig Dispense Refill  . amitriptyline (ELAVIL) 25 MG tablet Take 25 mg by mouth at bedtime.     Marland Kitchen amLODipine (NORVASC) 10 MG tablet Take 10 mg by mouth daily.     Marland Kitchen aspirin EC 81 MG tablet Take 81 mg by mouth daily. Swallow whole.    Marland Kitchen atorvastatin (LIPITOR) 80 MG tablet Take 80 mg by mouth daily.     . Dulaglutide (TRULICITY) 3 WP/7.9YI SOPN Inject 3 mg into the skin every Wednesday.    . DULoxetine (CYMBALTA) 30 MG capsule Take 30 mg by mouth daily.    Marland Kitchen losartan (COZAAR) 100 MG tablet Take 100 mg by mouth daily.     Marland Kitchen NOVOLOG MIX 70/30 FLEXPEN (70-30) 100 UNIT/ML FlexPen Inject 25 Units into the skin 2 (two) times daily with a meal.     . torsemide (DEMADEX) 20 MG tablet Take 20 mg by mouth daily.    . Blood Glucose Monitoring Suppl (TRUE METRIX METER) w/Device KIT   0  . glucose blood (CONTOUR TEST) test strip Use 2 (two) times daily.    . Lancets (ONETOUCH ULTRASOFT) lancets by Does not apply route.    Marland Kitchen ULTICARE MINI PEN NEEDLES 31G X 6 MM MISC   0  . UNABLE TO FIND Diabetic Supplies:  Glucometer, test strips, lancets. Check glucose BID (fasting and 2 hours after eating). One month supply. Refills PRN.  Results for orders placed or performed during the hospital encounter of 08/10/20 (from the past 48 hour(s))  Glucose, capillary     Status: None   Collection Time: 08/10/20  7:06 AM  Result Value Ref Range   Glucose-Capillary 87 70 - 99 mg/dL    Comment: Glucose reference range applies only to samples taken after fasting for at least 8 hours.   No results found.  Review of Systems  Constitutional: Negative.   HENT: Negative.   Eyes: Negative.   Respiratory: Negative.   Cardiovascular: Negative.   Gastrointestinal: Negative.   Endocrine: Negative.   Genitourinary: Negative.   Musculoskeletal: Negative.   Skin: Negative.   Allergic/Immunologic: Negative.    Neurological: Negative.   Hematological: Negative.   Psychiatric/Behavioral: Negative.     Blood pressure (!) 154/72, temperature 98 F (36.7 C), temperature source Oral, resp. rate 19, height 6' (1.829 m), weight 94.3 kg, SpO2 96 %. Physical Exam  GENERAL: The patient is AO x3, in no acute distress. HEENT: Head is normocephalic and atraumatic. EOMI are intact. Mouth is well hydrated and without lesions. NECK: Supple. No masses LUNGS: Clear to auscultation. No presence of rhonchi/wheezing/rales. Adequate chest expansion HEART: RRR, normal s1 and s2. ABDOMEN: Soft, nontender, no guarding, no peritoneal signs, and nondistended. BS +. No masses. EXTREMITIES: Without any cyanosis, clubbing, rash, lesions or edema. NEUROLOGIC: AOx3, no focal motor deficit. SKIN: no jaundice, no rashes  Assessment/Plan 69 year old male with past medical history of CKD, diabetes, GERD, hyperlipidemia, hypertension, stroke, who comes to the hospital to undergo screening colonoscopy. The patient is at average risk for colorectal cancer.  We will proceed with colonoscopy today.   Harvel Quale, MD 08/10/2020, 8:02 AM

## 2020-08-10 NOTE — Transfer of Care (Signed)
Immediate Anesthesia Transfer of Care Note  Patient: [name]  Procedure(s) Performed: COLONOSCOPY WITH PROPOFOL (N/A ) POLYPECTOMY BIOPSY  Patient Location: PACU  Anesthesia Type:General  Level of Consciousness: awake, alert , oriented and patient cooperative  Airway & Oxygen Therapy: Patient Spontanous Breathing  Post-op Assessment: Report given to RN, Post -op Vital signs reviewed and stable and Patient moving all extremities X 4  Post vital signs: Reviewed and stable  Last Vitals:  Vitals Value Taken Time  BP 107/47 08/10/20 0847  Temp 36.4 C 08/10/20 0847  Pulse 80 08/10/20 0847  Resp 14 08/10/20 0847  SpO2 100 % 08/10/20 0847    Last Pain:  Vitals:   08/10/20 0847  TempSrc: Oral  PainSc: 0-No pain      Patients Stated Pain Goal: 8 (01/00/71 2197)  Complications: No complications documented.

## 2020-08-11 LAB — SURGICAL PATHOLOGY

## 2020-08-16 ENCOUNTER — Encounter (HOSPITAL_COMMUNITY): Payer: Self-pay | Admitting: Gastroenterology

## 2020-08-20 ENCOUNTER — Ambulatory Visit: Payer: Medicare HMO | Admitting: Podiatry

## 2020-11-18 ENCOUNTER — Ambulatory Visit (INDEPENDENT_AMBULATORY_CARE_PROVIDER_SITE_OTHER): Payer: Medicare Other | Admitting: Sports Medicine

## 2020-11-18 ENCOUNTER — Other Ambulatory Visit: Payer: Self-pay

## 2020-11-18 ENCOUNTER — Encounter: Payer: Self-pay | Admitting: Sports Medicine

## 2020-11-18 DIAGNOSIS — S98132A Complete traumatic amputation of one left lesser toe, initial encounter: Secondary | ICD-10-CM | POA: Diagnosis not present

## 2020-11-18 DIAGNOSIS — L97521 Non-pressure chronic ulcer of other part of left foot limited to breakdown of skin: Secondary | ICD-10-CM | POA: Diagnosis not present

## 2020-11-18 DIAGNOSIS — E08621 Diabetes mellitus due to underlying condition with foot ulcer: Secondary | ICD-10-CM

## 2020-11-18 DIAGNOSIS — E1142 Type 2 diabetes mellitus with diabetic polyneuropathy: Secondary | ICD-10-CM

## 2020-11-18 NOTE — Progress Notes (Signed)
Subjective: [name] is a 70 y.o. male patient seen today in office for evaluation of left foot blister x 3 weeks bleeding, reports that he thinks it started after a shoe rubbed the area when he went out of town. Denies constitutional symptoms.   FBS 150 but last A1c per patient around ~8  Patient Active Problem List   Diagnosis Date Noted  . Diabetic foot ulcer ([place]) 10/07/2019  . Benign hypertensive kidney disease with chronic kidney disease 06/17/2019  . Proteinuria 06/17/2019  . Stage 3b chronic kidney disease ([name]) 06/17/2019  . History of amputation of lesser toe of left foot ([place]) 08/18/2015  . Hyperlipidemia, mixed 08/18/2015  . Type 2 diabetes mellitus with peripheral neuropathy ([place]) 08/18/2015  . Acute CVA (cerebrovascular accident) ([place]) 08/09/2015  . Acute renal failure superimposed on stage 3 chronic kidney disease ([place]) 08/09/2015  . Essential hypertriglyceridemia 08/09/2015  . Hypertension, essential 08/09/2015  . Right-sided muscle weakness 08/09/2015  . T2DM (type 2 diabetes mellitus) ([place]) 08/09/2015  . Tobacco abuse 08/09/2015  . CKD (chronic kidney disease) 04/17/2013  . DM2 (diabetes mellitus, type 2) ([place]) 04/17/2013  . HTN (hypertension) 04/17/2013  . Osteomyelitis of toe of left foot ([place]) 04/17/2013    Current Outpatient Medications on File Prior to Visit  Medication Sig Dispense Refill  . amitriptyline (ELAVIL) 25 MG tablet Take 25 mg by mouth at bedtime.     Marland Kitchen amLODipine (NORVASC) 10 MG tablet Take 10 mg by mouth daily.     Marland Kitchen aspirin EC 81 MG tablet Take 81 mg by mouth daily. Swallow whole.    Marland Kitchen atorvastatin (LIPITOR) 80 MG tablet Take 80 mg by mouth daily.     . Blood Glucose Monitoring Suppl (TRUE METRIX METER) w/Device KIT   0  . Dulaglutide (TRULICITY) 3 OJ/5.0KX SOPN Inject 3 mg into the skin every Wednesday.    . DULoxetine (CYMBALTA) 30 MG capsule Take 30 mg by mouth daily.    Marland Kitchen glucose blood (CONTOUR TEST) test strip Use 2 (two) times  daily.    . Lancets (ONETOUCH ULTRASOFT) lancets by Does not apply route.    Marland Kitchen losartan (COZAAR) 100 MG tablet Take 100 mg by mouth daily.     Marland Kitchen NOVOLOG MIX 70/30 FLEXPEN (70-30) 100 UNIT/ML FlexPen Inject 25 Units into the skin 2 (two) times daily with a meal.     . torsemide (DEMADEX) 20 MG tablet Take 20 mg by mouth daily.    Marland Kitchen ULTICARE MINI PEN NEEDLES 31G X 6 MM MISC   0  . UNABLE TO FIND Diabetic Supplies:  Glucometer, test strips, lancets. Check glucose BID (fasting and 2 hours after eating). One month supply. Refills PRN.     No current facility-administered medications on file prior to visit.    No Known Allergies  Objective: There were no vitals filed for this visit.  Gen NAD  Derm: Partial thickness ulceration sub met 1 on left foot that measures 0.5x0.3x0.2cm with keratosis, no active drainage, no redness, no warmth, no acute signs of infection. Neurovascular unchanged from prior Ortho: S/p digital amps  Assessment and Plan:  Problem List Items Addressed This Visit      Endocrine   Type 2 diabetes mellitus with peripheral neuropathy ([place])   Diabetic foot ulcer ([place]) - Primary    Other Visit Diagnoses    Amputated toe of left foot ([place])           -Patient seen and evaluated - Excisionally dedbrided ulceration at left  sub met 1 to healthy bleeding borders removing nonviable tissue using a sterile chisel blade. Wound measures post debridement as above. Wound was debrided to the level of the dermis with viable wound base exposed to promote healing. Hemostasis was achieved with manuel pressure. Patient tolerated procedure well without any discomfort or anesthesia necessary for this wound debridement.  -Applied medihoney and dry sterile dressing and instructed patient to continue with daily dressings at home consisting of same. -Continue with diabetic shoe with offloading insole - Advised patient to go to the ER or return to office if the wound worsens or if constitutional  symptoms are present. -Return in 2-3 weeks for wound care; if no better will xray and culture next visit.  [name], DPM

## 2020-11-22 ENCOUNTER — Ambulatory Visit: Payer: Medicare HMO | Admitting: Podiatry

## 2020-12-09 ENCOUNTER — Encounter: Payer: Self-pay | Admitting: Sports Medicine

## 2020-12-09 ENCOUNTER — Other Ambulatory Visit: Payer: Self-pay

## 2020-12-09 ENCOUNTER — Ambulatory Visit (INDEPENDENT_AMBULATORY_CARE_PROVIDER_SITE_OTHER): Payer: Medicare Other | Admitting: Sports Medicine

## 2020-12-09 DIAGNOSIS — L97521 Non-pressure chronic ulcer of other part of left foot limited to breakdown of skin: Secondary | ICD-10-CM | POA: Diagnosis not present

## 2020-12-09 DIAGNOSIS — S98132A Complete traumatic amputation of one left lesser toe, initial encounter: Secondary | ICD-10-CM

## 2020-12-09 DIAGNOSIS — E08621 Diabetes mellitus due to underlying condition with foot ulcer: Secondary | ICD-10-CM | POA: Diagnosis not present

## 2020-12-09 NOTE — Progress Notes (Signed)
Subjective: [name] is a 70 y.o. male patient seen today in office for evaluation of left foot ulceration.  Patient reports that is doing good very little bleeding denies nausea vomiting fever chills or any other constitutional symptoms at this time has been doing his dressings daily using Medihoney without any issues.   FBS 140-50 but last A1c per patient around ~8 like previous  Patient Active Problem List   Diagnosis Date Noted  . Anemia in chronic kidney disease 05/27/2020  . Secondary hyperparathyroidism of renal origin ([place]) 02/25/2020  . Diabetic foot ulcer ([place]) 10/07/2019  . Benign hypertensive kidney disease with chronic kidney disease 06/17/2019  . Proteinuria 06/17/2019  . Stage 3b chronic kidney disease ([place]) 06/17/2019  . History of amputation of lesser toe of left foot ([place]) 08/18/2015  . Hyperlipidemia, mixed 08/18/2015  . Type 2 diabetes mellitus with peripheral neuropathy ([place]) 08/18/2015  . Acute CVA (cerebrovascular accident) ([place]) 08/09/2015  . Acute renal failure superimposed on stage 3 chronic kidney disease ([place]) 08/09/2015  . Essential hypertriglyceridemia 08/09/2015  . Hypertension, essential 08/09/2015  . Right-sided muscle weakness 08/09/2015  . T2DM (type 2 diabetes mellitus) ([place]) 08/09/2015  . Tobacco abuse 08/09/2015  . CKD (chronic kidney disease) 04/17/2013  . DM2 (diabetes mellitus, type 2) ([place]) 04/17/2013  . HTN (hypertension) 04/17/2013  . Osteomyelitis of toe of left foot ([place]) 04/17/2013    Current Outpatient Medications on File Prior to Visit  Medication Sig Dispense Refill  . amitriptyline (ELAVIL) 25 MG tablet Take 25 mg by mouth at bedtime.     Marland Kitchen amLODipine (NORVASC) 10 MG tablet Take 10 mg by mouth daily.     Marland Kitchen aspirin EC 81 MG tablet Take 81 mg by mouth daily. Swallow whole.    Marland Kitchen atorvastatin (LIPITOR) 40 MG tablet Take 40 mg by mouth at bedtime.    Marland Kitchen atorvastatin (LIPITOR) 80 MG tablet Take 80 mg by mouth daily.     . Blood  Glucose Monitoring Suppl (TRUE METRIX METER) w/Device KIT   0  . Dulaglutide (TRULICITY) 3 JQ/3.0SP SOPN Inject 3 mg into the skin every Wednesday.    . DULoxetine (CYMBALTA) 30 MG capsule Take 30 mg by mouth daily.    Marland Kitchen glucose blood test strip Use 2 (two) times daily.    . Lancets (ONETOUCH ULTRASOFT) lancets by Does not apply route.    Marland Kitchen losartan (COZAAR) 100 MG tablet Take 100 mg by mouth daily.     Marland Kitchen NOVOLOG MIX 70/30 FLEXPEN (70-30) 100 UNIT/ML FlexPen Inject 25 Units into the skin 2 (two) times daily with a meal.     . pregabalin (LYRICA) 25 MG capsule     . torsemide (DEMADEX) 20 MG tablet Take 20 mg by mouth daily.    Marland Kitchen ULTICARE MINI PEN NEEDLES 31G X 6 MM MISC   0  . UNABLE TO FIND Diabetic Supplies:  Glucometer, test strips, lancets. Check glucose BID (fasting and 2 hours after eating). One month supply. Refills PRN.     No current facility-administered medications on file prior to visit.    No Known Allergies  Objective: There were no vitals filed for this visit.  Gen NAD  Derm: Partial thickness ulceration sub met 1 on left foot that measures 0.2x0.3x0.2cm with keratosis, no active drainage, no redness, no warmth, no acute signs of infection. Neurovascular unchanged from prior Ortho: S/p digital amps  Assessment and Plan:  Problem List Items Addressed This Visit      Endocrine  Diabetic foot ulcer ([place]) - Primary    Other Visit Diagnoses    Amputated toe of left foot ([place])           -Patient seen and evaluated - Excisionally dedbrided ulceration at left sub met 1 to healthy bleeding borders removing nonviable tissue using a sterile chisel blade. Wound measures post debridement as above. Wound was debrided to the level of the dermis with viable wound base exposed to promote healing. Hemostasis was achieved with manuel pressure. Patient tolerated procedure well without any discomfort or anesthesia necessary for this wound debridement.  -Applied medihoney and dry  sterile dressing and instructed patient to continue with daily dressings at home consisting of same. -Continue with diabetic shoe with offloading insole if worsens return to surgical shoe - Advised patient to go to the ER or return to office if the wound worsens or if constitutional symptoms are present. -Return in 2-3 weeks for wound care; if no better will xray and culture next visit.  [name], DPM

## 2021-01-06 ENCOUNTER — Ambulatory Visit: Payer: Medicare Other | Admitting: Sports Medicine

## 2021-01-27 ENCOUNTER — Ambulatory Visit (INDEPENDENT_AMBULATORY_CARE_PROVIDER_SITE_OTHER): Payer: Medicare Other | Admitting: Sports Medicine

## 2021-01-27 ENCOUNTER — Other Ambulatory Visit: Payer: Self-pay

## 2021-01-27 ENCOUNTER — Encounter: Payer: Self-pay | Admitting: Sports Medicine

## 2021-01-27 DIAGNOSIS — S98132A Complete traumatic amputation of one left lesser toe, initial encounter: Secondary | ICD-10-CM

## 2021-01-27 DIAGNOSIS — L97521 Non-pressure chronic ulcer of other part of left foot limited to breakdown of skin: Secondary | ICD-10-CM | POA: Diagnosis not present

## 2021-01-27 DIAGNOSIS — E1142 Type 2 diabetes mellitus with diabetic polyneuropathy: Secondary | ICD-10-CM

## 2021-01-27 DIAGNOSIS — E08621 Diabetes mellitus due to underlying condition with foot ulcer: Secondary | ICD-10-CM

## 2021-01-27 NOTE — Progress Notes (Signed)
Subjective: [name] is a 70 y.o. male patient seen today in office for evaluation of left foot ulceration.  Patient reports that is doing good  using Medihoney without any issues.   FBS unchanged from prior.  Patient Active Problem List   Diagnosis Date Noted  . Anemia in chronic kidney disease 05/27/2020  . Secondary hyperparathyroidism of renal origin ([name]) 02/25/2020  . Diabetic foot ulcer ([place]) 10/07/2019  . Benign hypertensive kidney disease with chronic kidney disease 06/17/2019  . Proteinuria 06/17/2019  . Stage 3b chronic kidney disease ([place]) 06/17/2019  . History of amputation of lesser toe of left foot ([place]) 08/18/2015  . Hyperlipidemia, mixed 08/18/2015  . Type 2 diabetes mellitus with peripheral neuropathy ([place]) 08/18/2015  . Acute CVA (cerebrovascular accident) ([place]) 08/09/2015  . Acute renal failure superimposed on stage 3 chronic kidney disease ([place]) 08/09/2015  . Essential hypertriglyceridemia 08/09/2015  . Hypertension, essential 08/09/2015  . Right-sided muscle weakness 08/09/2015  . T2DM (type 2 diabetes mellitus) ([place]) 08/09/2015  . Tobacco abuse 08/09/2015  . CKD (chronic kidney disease) 04/17/2013  . DM2 (diabetes mellitus, type 2) ([place]) 04/17/2013  . HTN (hypertension) 04/17/2013  . Osteomyelitis of toe of left foot ([place]) 04/17/2013    Current Outpatient Medications on File Prior to Visit  Medication Sig Dispense Refill  . amitriptyline (ELAVIL) 25 MG tablet Take 25 mg by mouth at bedtime.     Marland Kitchen amLODipine (NORVASC) 10 MG tablet Take 10 mg by mouth daily.     Marland Kitchen aspirin EC 81 MG tablet Take 81 mg by mouth daily. Swallow whole.    Marland Kitchen atorvastatin (LIPITOR) 40 MG tablet Take 40 mg by mouth at bedtime.    Marland Kitchen atorvastatin (LIPITOR) 80 MG tablet Take 80 mg by mouth daily.     . Blood Glucose Monitoring Suppl (TRUE METRIX METER) w/Device KIT   0  . Dulaglutide (TRULICITY) 3 NW/2.9FA SOPN Inject 3 mg into the skin every Wednesday.    . DULoxetine (CYMBALTA)  30 MG capsule Take 30 mg by mouth daily.    Marland Kitchen glucose blood test strip Use 2 (two) times daily.    . Lancets (ONETOUCH ULTRASOFT) lancets by Does not apply route.    Marland Kitchen losartan (COZAAR) 100 MG tablet Take 100 mg by mouth daily.     Marland Kitchen NOVOLOG MIX 70/30 FLEXPEN (70-30) 100 UNIT/ML FlexPen Inject 25 Units into the skin 2 (two) times daily with a meal.     . pregabalin (LYRICA) 25 MG capsule     . torsemide (DEMADEX) 20 MG tablet Take 20 mg by mouth daily.    Marland Kitchen ULTICARE MINI PEN NEEDLES 31G X 6 MM MISC   0  . UNABLE TO FIND Diabetic Supplies:  Glucometer, test strips, lancets. Check glucose BID (fasting and 2 hours after eating). One month supply. Refills PRN.     No current facility-administered medications on file prior to visit.    No Known Allergies  Objective: There were no vitals filed for this visit.  Gen NAD  Derm: Partial thickness ulceration sub met 1 on left foot that measures 0.2x0.2x0.2cm with keratosis, no active drainage, no redness, no warmth, no acute signs of infection. Neurovascular unchanged from prior Ortho: S/p digital amps  Assessment and Plan:  Problem List Items Addressed This Visit      Endocrine   Type 2 diabetes mellitus with peripheral neuropathy ([place])   Diabetic foot ulcer ([place]) - Primary    Other Visit Diagnoses    Amputated toe  of left foot ([place])           -Patient seen and evaluated - Excisionally dedbrided ulceration at left sub met 1 to healthy bleeding borders removing nonviable tissue using a sterile chisel blade. Wound measures post debridement as above. Wound was debrided to the level of the dermis with viable wound base exposed to promote healing. Hemostasis was achieved with manuel pressure. Patient tolerated procedure well without any discomfort or anesthesia necessary for this wound debridement.  -Applied medihoney and betadine and dry sterile dressing and instructed patient to continue with daily dressings at home consisting of  same. -Continue with diabetic shoe with offloading insole if worsens return to surgical shoe - Advised patient to go to the ER or return to office if the wound worsens or if constitutional symptoms are present. -Return in 2-3 weeks for wound care; advised patient to consider surgery to remove prominent met head.  [name], DPM

## 2021-02-17 ENCOUNTER — Encounter: Payer: Self-pay | Admitting: Sports Medicine

## 2021-02-17 ENCOUNTER — Ambulatory Visit: Payer: Medicare Other | Admitting: Sports Medicine

## 2021-02-17 ENCOUNTER — Other Ambulatory Visit: Payer: Self-pay

## 2021-02-17 DIAGNOSIS — S98132A Complete traumatic amputation of one left lesser toe, initial encounter: Secondary | ICD-10-CM

## 2021-02-17 DIAGNOSIS — L97521 Non-pressure chronic ulcer of other part of left foot limited to breakdown of skin: Secondary | ICD-10-CM | POA: Diagnosis not present

## 2021-02-17 DIAGNOSIS — M79672 Pain in left foot: Secondary | ICD-10-CM

## 2021-02-17 DIAGNOSIS — E1142 Type 2 diabetes mellitus with diabetic polyneuropathy: Secondary | ICD-10-CM

## 2021-02-17 DIAGNOSIS — M216X2 Other acquired deformities of left foot: Secondary | ICD-10-CM

## 2021-02-17 DIAGNOSIS — E08621 Diabetes mellitus due to underlying condition with foot ulcer: Secondary | ICD-10-CM

## 2021-02-17 NOTE — Progress Notes (Signed)
Subjective: [name] is a 70 y.o. male patient seen today in office for evaluation of left foot ulceration.  Patient reports that is doing good  using Medihoney without any issues.  Reports that his blood sugars have been doing good last visit to PCP was 2 weeks ago.  Denies constitutional symptoms at this time.  Patient Active Problem List   Diagnosis Date Noted  . Anemia in chronic kidney disease 05/27/2020  . Secondary hyperparathyroidism of renal origin ([place]) 02/25/2020  . Diabetic foot ulcer ([place]) 10/07/2019  . Benign hypertensive kidney disease with chronic kidney disease 06/17/2019  . Proteinuria 06/17/2019  . Stage 3b chronic kidney disease ([place]) 06/17/2019  . History of amputation of lesser toe of left foot ([place]) 08/18/2015  . Hyperlipidemia, mixed 08/18/2015  . Type 2 diabetes mellitus with peripheral neuropathy ([place]) 08/18/2015  . Acute CVA (cerebrovascular accident) ([place]) 08/09/2015  . Acute renal failure superimposed on stage 3 chronic kidney disease ([place]) 08/09/2015  . Essential hypertriglyceridemia 08/09/2015  . Hypertension, essential 08/09/2015  . Right-sided muscle weakness 08/09/2015  . T2DM (type 2 diabetes mellitus) ([place]) 08/09/2015  . Tobacco abuse 08/09/2015  . CKD (chronic kidney disease) 04/17/2013  . DM2 (diabetes mellitus, type 2) ([place]) 04/17/2013  . HTN (hypertension) 04/17/2013  . Osteomyelitis of toe of left foot ([place]) 04/17/2013    Current Outpatient Medications on File Prior to Visit  Medication Sig Dispense Refill  . amitriptyline (ELAVIL) 25 MG tablet Take 25 mg by mouth at bedtime.     Marland Kitchen amLODipine (NORVASC) 10 MG tablet Take 10 mg by mouth daily.     Marland Kitchen aspirin EC 81 MG tablet Take 81 mg by mouth daily. Swallow whole.    Marland Kitchen atorvastatin (LIPITOR) 40 MG tablet Take 40 mg by mouth at bedtime.    Marland Kitchen atorvastatin (LIPITOR) 80 MG tablet Take 80 mg by mouth daily.     . Blood Glucose Monitoring Suppl (TRUE METRIX METER) w/Device KIT   0  .  Dulaglutide (TRULICITY) 3 VE/9.3YB SOPN Inject 3 mg into the skin every Wednesday.    . DULoxetine (CYMBALTA) 30 MG capsule Take 30 mg by mouth daily.    Marland Kitchen glucose blood test strip Use 2 (two) times daily.    . Lancets (ONETOUCH ULTRASOFT) lancets by Does not apply route.    Marland Kitchen losartan (COZAAR) 100 MG tablet Take 100 mg by mouth daily.     Marland Kitchen NOVOLOG MIX 70/30 FLEXPEN (70-30) 100 UNIT/ML FlexPen Inject 25 Units into the skin 2 (two) times daily with a meal.     . pregabalin (LYRICA) 25 MG capsule     . torsemide (DEMADEX) 20 MG tablet Take 20 mg by mouth daily.    Marland Kitchen ULTICARE MINI PEN NEEDLES 31G X 6 MM MISC   0  . UNABLE TO FIND Diabetic Supplies:  Glucometer, test strips, lancets. Check glucose BID (fasting and 2 hours after eating). One month supply. Refills PRN.     No current facility-administered medications on file prior to visit.    No Known Allergies  Objective: There were no vitals filed for this visit.  Gen NAD  Derm: Partial thickness ulceration sub met 1 on left foot that measures 0.2x0.2x0.2cm same as last visit with keratosis, no active drainage, no redness, no warmth, no acute signs of infection. Neurovascular unchanged from prior Ortho: S/p digital amps  Assessment and Plan:  Problem List Items Addressed This Visit      Endocrine   Type 2 diabetes mellitus  with peripheral neuropathy ([place])   Diabetic foot ulcer ([place]) - Primary    Other Visit Diagnoses    Amputated toe of left foot ([place])       Prominent metatarsal head of left foot       Left foot pain           -Patient seen and evaluated - Excisionally dedbrided ulceration at left sub met 1 to healthy bleeding borders removing nonviable tissue using a sterile chisel blade. Wound measures post debridement as above. Wound was debrided to the level of the dermis with viable wound base exposed to promote healing. Hemostasis was achieved with manuel pressure. Patient tolerated procedure well without any discomfort or  anesthesia necessary for this wound debridement.  -Applied medihoney and offloading padding and dry sterile dressing and instructed patient to continue with daily dressings at home consisting of same. -Continue with diabetic shoe with offloading insole if worsens return to surgical shoe however patient still continues with a normal shoe - Advised patient to go to the ER or return to office if the wound worsens or if constitutional symptoms are present. -Return in 3-4 weeks for wound care; advised patient to consider surgery to remove prominent met head however at this time patient still declines for surgery and wants to continue with conservative care.  [name], DPM

## 2021-03-17 ENCOUNTER — Ambulatory Visit: Payer: Medicare Other | Admitting: Sports Medicine

## 2021-03-17 ENCOUNTER — Encounter: Payer: Self-pay | Admitting: Sports Medicine

## 2021-03-17 ENCOUNTER — Other Ambulatory Visit: Payer: Self-pay

## 2021-03-17 DIAGNOSIS — L97521 Non-pressure chronic ulcer of other part of left foot limited to breakdown of skin: Secondary | ICD-10-CM | POA: Diagnosis not present

## 2021-03-17 DIAGNOSIS — E08621 Diabetes mellitus due to underlying condition with foot ulcer: Secondary | ICD-10-CM | POA: Diagnosis not present

## 2021-03-17 DIAGNOSIS — S98132A Complete traumatic amputation of one left lesser toe, initial encounter: Secondary | ICD-10-CM

## 2021-03-17 DIAGNOSIS — M216X2 Other acquired deformities of left foot: Secondary | ICD-10-CM

## 2021-03-17 DIAGNOSIS — M79672 Pain in left foot: Secondary | ICD-10-CM

## 2021-03-17 DIAGNOSIS — E1142 Type 2 diabetes mellitus with diabetic polyneuropathy: Secondary | ICD-10-CM

## 2021-03-17 NOTE — Progress Notes (Signed)
Subjective: Carnie Bruemmer is a 70 y.o. male patient seen today in office for evaluation of left foot ulceration.  Patient reports that is doing fine using Medihoney without any issues.  Reports that his blood sugars have been doing good last visit to PCP was 2 weeks ago.  Denies constitutional symptoms at this time.  Patient Active Problem List   Diagnosis Date Noted   Anemia in chronic kidney disease 05/27/2020   Secondary hyperparathyroidism of renal origin ([place]) 02/25/2020   Diabetic foot ulcer ([place]) 10/07/2019   Benign hypertensive kidney disease with chronic kidney disease 06/17/2019   Proteinuria 06/17/2019   Stage 3b chronic kidney disease ([place]) 06/17/2019   History of amputation of lesser toe of left foot ([place]) 08/18/2015   Hyperlipidemia, mixed 08/18/2015   Type 2 diabetes mellitus with peripheral neuropathy ([place]) 08/18/2015   Acute CVA (cerebrovascular accident) ([place]) 08/09/2015   Acute renal failure superimposed on stage 3 chronic kidney disease ([place]) 08/09/2015   Essential hypertriglyceridemia 08/09/2015   Hypertension, essential 08/09/2015   Right-sided muscle weakness 08/09/2015   T2DM (type 2 diabetes mellitus) ([place]) 08/09/2015   Tobacco abuse 08/09/2015   CKD (chronic kidney disease) 04/17/2013   DM2 (diabetes mellitus, type 2) ([place]) 04/17/2013   HTN (hypertension) 04/17/2013   Osteomyelitis of toe of left foot ([place]) 04/17/2013    Current Outpatient Medications on File Prior to Visit  Medication Sig Dispense Refill   amitriptyline (ELAVIL) 25 MG tablet Take 25 mg by mouth at bedtime.      amLODipine (NORVASC) 10 MG tablet Take 10 mg by mouth daily.      aspirin EC 81 MG tablet Take 81 mg by mouth daily. Swallow whole.     atorvastatin (LIPITOR) 40 MG tablet Take 40 mg by mouth at bedtime.     atorvastatin (LIPITOR) 80 MG tablet Take 80 mg by mouth daily.      Blood Glucose Monitoring Suppl (TRUE METRIX METER) w/Device KIT   0   Dulaglutide (TRULICITY) 3 OI/7.1IW  SOPN Inject 3 mg into the skin every Wednesday.     DULoxetine (CYMBALTA) 30 MG capsule Take 30 mg by mouth daily.     glucose blood test strip Use 2 (two) times daily.     Lancets (ONETOUCH ULTRASOFT) lancets by Does not apply route.     losartan (COZAAR) 100 MG tablet Take 100 mg by mouth daily.      NOVOLOG MIX 70/30 FLEXPEN (70-30) 100 UNIT/ML FlexPen Inject 25 Units into the skin 2 (two) times daily with a meal.      pregabalin (LYRICA) 25 MG capsule      torsemide (DEMADEX) 20 MG tablet Take 20 mg by mouth daily.     ULTICARE MINI PEN NEEDLES 31G X 6 MM MISC   0   UNABLE TO FIND Diabetic Supplies:  Glucometer, test strips, lancets. Check glucose BID (fasting and 2 hours after eating). One month supply. Refills PRN.     No current facility-administered medications on file prior to visit.    No Known Allergies  Objective: There were no vitals filed for this visit.  Gen NAD  Derm: Partial thickness ulceration sub met 1 on left foot that measures 0.2x0.3x0.2cm similar as last visit with keratosis, no active drainage, no redness, no warmth, no acute signs of infection. Neurovascular unchanged from prior Ortho: S/p digital amps  Assessment and Plan:  Problem List Items Addressed This Visit       Endocrine   Type 2 diabetes mellitus  with peripheral neuropathy ([place])   Diabetic foot ulcer ([place]) - Primary   Other Visit Diagnoses     Amputated toe of left foot ([place])       Prominent metatarsal head of left foot       Left foot pain            -Patient seen and evaluated - Excisionally dedbrided ulceration at left sub met 1 to healthy bleeding borders removing nonviable tissue using a sterile chisel blade. Wound measures post debridement as above. Wound was debrided to the level of the dermis with viable wound base exposed to promote healing. Hemostasis was achieved with manuel pressure. Patient tolerated procedure well without any discomfort or anesthesia necessary for this wound  debridement.  -Applied medihoney and offloading padding and dry sterile dressing and instructed patient to continue with daily dressings at home consisting of same. -Continue with diabetic shoe with offloading insole if worsens return to surgical shoe however patient still continues with a normal shoe - Advised patient to go to the ER or return to office if the wound worsens or if constitutional symptoms are present. -Return in 3-4 weeks for wound care and xrays; advised patient again to consider surgery to remove prominent met head however at this time patient still declines for surgery and wants to continue with conservative care.  [name], DPM

## 2021-04-28 ENCOUNTER — Ambulatory Visit: Payer: Medicare Other | Admitting: Sports Medicine

## 2021-04-28 ENCOUNTER — Ambulatory Visit (INDEPENDENT_AMBULATORY_CARE_PROVIDER_SITE_OTHER): Payer: Medicare Other

## 2021-04-28 ENCOUNTER — Other Ambulatory Visit: Payer: Self-pay

## 2021-04-28 ENCOUNTER — Encounter: Payer: Self-pay | Admitting: Sports Medicine

## 2021-04-28 DIAGNOSIS — E1142 Type 2 diabetes mellitus with diabetic polyneuropathy: Secondary | ICD-10-CM

## 2021-04-28 DIAGNOSIS — L97521 Non-pressure chronic ulcer of other part of left foot limited to breakdown of skin: Secondary | ICD-10-CM | POA: Diagnosis not present

## 2021-04-28 DIAGNOSIS — M79672 Pain in left foot: Secondary | ICD-10-CM

## 2021-04-28 DIAGNOSIS — M216X2 Other acquired deformities of left foot: Secondary | ICD-10-CM

## 2021-04-28 DIAGNOSIS — E08621 Diabetes mellitus due to underlying condition with foot ulcer: Secondary | ICD-10-CM

## 2021-04-28 DIAGNOSIS — S98132A Complete traumatic amputation of one left lesser toe, initial encounter: Secondary | ICD-10-CM

## 2021-04-28 NOTE — Progress Notes (Signed)
Subjective: [name] is a 70 y.o. male patient seen today in office for evaluation of left foot ulceration.  Patient reports that is doing fine using Medihoney without any issues notices bloody drainage.  Reports that his blood sugars have been doing good last A1c 7 like before.  Denies constitutional symptoms at this time.  Patient Active Problem List   Diagnosis Date Noted   Anemia in chronic kidney disease 05/27/2020   Secondary hyperparathyroidism of renal origin ([place]) 02/25/2020   Diabetic foot ulcer ([place]) 10/07/2019   Benign hypertensive kidney disease with chronic kidney disease 06/17/2019   Proteinuria 06/17/2019   Stage 3b chronic kidney disease ([place]) 06/17/2019   History of amputation of lesser toe of left foot ([place]) 08/18/2015   Hyperlipidemia, mixed 08/18/2015   Type 2 diabetes mellitus with peripheral neuropathy ([place]) 08/18/2015   Acute CVA (cerebrovascular accident) ([place]) 08/09/2015   Acute renal failure superimposed on stage 3 chronic kidney disease ([place]) 08/09/2015   Essential hypertriglyceridemia 08/09/2015   Hypertension, essential 08/09/2015   Right-sided muscle weakness 08/09/2015   T2DM (type 2 diabetes mellitus) ([place]) 08/09/2015   Tobacco abuse 08/09/2015   CKD (chronic kidney disease) 04/17/2013   DM2 (diabetes mellitus, type 2) ([place]) 04/17/2013   HTN (hypertension) 04/17/2013   Osteomyelitis of toe of left foot ([place]) 04/17/2013    Current Outpatient Medications on File Prior to Visit  Medication Sig Dispense Refill   amitriptyline (ELAVIL) 25 MG tablet Take 25 mg by mouth at bedtime.      amLODipine (NORVASC) 10 MG tablet Take 10 mg by mouth daily.      aspirin EC 81 MG tablet Take 81 mg by mouth daily. Swallow whole.     atorvastatin (LIPITOR) 40 MG tablet Take 40 mg by mouth at bedtime.     atorvastatin (LIPITOR) 80 MG tablet Take 80 mg by mouth daily.      Blood Glucose Monitoring Suppl (TRUE METRIX METER) w/Device KIT   0   Dulaglutide (TRULICITY)  3 TJ/0.3ES SOPN Inject 3 mg into the skin every Wednesday.     DULoxetine (CYMBALTA) 30 MG capsule Take 30 mg by mouth daily.     glucose blood test strip Use 2 (two) times daily.     Lancets (ONETOUCH ULTRASOFT) lancets by Does not apply route.     losartan (COZAAR) 100 MG tablet Take 100 mg by mouth daily.      NOVOLOG MIX 70/30 FLEXPEN (70-30) 100 UNIT/ML FlexPen Inject 25 Units into the skin 2 (two) times daily with a meal.      pregabalin (LYRICA) 25 MG capsule      torsemide (DEMADEX) 20 MG tablet Take 20 mg by mouth daily.     ULTICARE MINI PEN NEEDLES 31G X 6 MM MISC   0   UNABLE TO FIND Diabetic Supplies:  Glucometer, test strips, lancets. Check glucose BID (fasting and 2 hours after eating). One month supply. Refills PRN.     No current facility-administered medications on file prior to visit.    No Known Allergies  Objective: There were no vitals filed for this visit.  Gen NAD  Derm: Partial thickness ulceration sub met 1 on left foot that measures 0.6x0.4x0.3cm  with keratosis fiber granular base, no active drainage, no redness, no warmth, no acute signs of infection. Neurovascular unchanged from prior Ortho: S/p digital amps  X-rays no acute osteomyelitis noted at area of ulceration plantar first metatarsal head patient is status post previous amputation and met head resections  at #2 and 3  Assessment and Plan:  Problem List Items Addressed This Visit       Endocrine   Type 2 diabetes mellitus with peripheral neuropathy ([place])   Diabetic foot ulcer ([place]) - Primary   Relevant Orders   DG Foot Complete Left   Other Visit Diagnoses     Amputated toe of left foot ([place])       Prominent metatarsal head of left foot       Left foot pain            -Patient seen and evaluated -Xrays reviewed  - Excisionally dedbrided ulceration at left sub met 1 to healthy bleeding borders removing nonviable tissue using a sterile chisel blade. Wound measures post debridement as  above. Wound was debrided to the level of the dermis with viable wound base exposed to promote healing. Hemostasis was achieved with manuel pressure. Patient tolerated procedure well without any discomfort or anesthesia necessary for this wound debridement.  -Applied medihoney and offloading padding and dry sterile dressing and instructed patient to continue with daily dressings at home consisting of same. -Continue with diabetic shoe with offloading insole if worsens return to surgical shoe however patient still continues with a normal shoe - Advised patient to go to the ER or return to office if the wound worsens or if constitutional symptoms are present. -Encourage patient to consider surgery to remove the metatarsal head to help the wound to heal however he still declines at this time -Return in 3-4 weeks for wound care however patient requests 2 months because of cost of copay advised patient that his wounds needs close attention and if anything worsens he must come in sooner if problems arise [name], DPM

## 2021-06-30 ENCOUNTER — Encounter: Payer: Self-pay | Admitting: Sports Medicine

## 2021-06-30 ENCOUNTER — Other Ambulatory Visit: Payer: Self-pay | Admitting: Sports Medicine

## 2021-06-30 ENCOUNTER — Other Ambulatory Visit: Payer: Self-pay

## 2021-06-30 ENCOUNTER — Ambulatory Visit: Payer: Medicare Other | Admitting: Sports Medicine

## 2021-06-30 DIAGNOSIS — M216X2 Other acquired deformities of left foot: Secondary | ICD-10-CM

## 2021-06-30 DIAGNOSIS — M79672 Pain in left foot: Secondary | ICD-10-CM

## 2021-06-30 DIAGNOSIS — L97521 Non-pressure chronic ulcer of other part of left foot limited to breakdown of skin: Secondary | ICD-10-CM | POA: Diagnosis not present

## 2021-06-30 DIAGNOSIS — E08621 Diabetes mellitus due to underlying condition with foot ulcer: Secondary | ICD-10-CM | POA: Diagnosis not present

## 2021-06-30 DIAGNOSIS — E1142 Type 2 diabetes mellitus with diabetic polyneuropathy: Secondary | ICD-10-CM

## 2021-06-30 DIAGNOSIS — S98132A Complete traumatic amputation of one left lesser toe, initial encounter: Secondary | ICD-10-CM

## 2021-06-30 NOTE — Progress Notes (Signed)
Subjective: [name] is a 70 y.o. male patient seen today in office for evaluation of left foot ulceration.  Patient reports that is doing good states that his wife had started using a different patch of medicine on his foot since last encounter.  A1c 7 like before.  Denies constitutional symptoms at this time.  Patient Active Problem List   Diagnosis Date Noted   Anemia in chronic kidney disease 05/27/2020   Secondary hyperparathyroidism of renal origin ([place]) 02/25/2020   Diabetic foot ulcer ([place]) 10/07/2019   Benign hypertensive kidney disease with chronic kidney disease 06/17/2019   Proteinuria 06/17/2019   Stage 3b chronic kidney disease ([place]) 06/17/2019   History of amputation of lesser toe of left foot ([place]) 08/18/2015   Hyperlipidemia, mixed 08/18/2015   Type 2 diabetes mellitus with peripheral neuropathy ([place]) 08/18/2015   Acute CVA (cerebrovascular accident) ([place]) 08/09/2015   Acute renal failure superimposed on stage 3 chronic kidney disease ([place]) 08/09/2015   Essential hypertriglyceridemia 08/09/2015   Hypertension, essential 08/09/2015   Right-sided muscle weakness 08/09/2015   T2DM (type 2 diabetes mellitus) ([place]) 08/09/2015   Tobacco abuse 08/09/2015   CKD (chronic kidney disease) 04/17/2013   DM2 (diabetes mellitus, type 2) ([place]) 04/17/2013   HTN (hypertension) 04/17/2013   Osteomyelitis of toe of left foot ([place]) 04/17/2013    Current Outpatient Medications on File Prior to Visit  Medication Sig Dispense Refill   amitriptyline (ELAVIL) 25 MG tablet Take 25 mg by mouth at bedtime.      amLODipine (NORVASC) 10 MG tablet Take 10 mg by mouth daily.      aspirin EC 81 MG tablet Take 81 mg by mouth daily. Swallow whole.     atorvastatin (LIPITOR) 40 MG tablet Take 40 mg by mouth at bedtime.     atorvastatin (LIPITOR) 80 MG tablet Take 80 mg by mouth daily.      Blood Glucose Monitoring Suppl (TRUE METRIX METER) w/Device KIT   0   Dulaglutide (TRULICITY) 3 WU/9.8JX  SOPN Inject 3 mg into the skin every Wednesday.     DULoxetine (CYMBALTA) 30 MG capsule Take 30 mg by mouth daily.     glucose blood test strip Use 2 (two) times daily.     Lancets (ONETOUCH ULTRASOFT) lancets by Does not apply route.     losartan (COZAAR) 100 MG tablet Take 100 mg by mouth daily.      NOVOLOG MIX 70/30 FLEXPEN (70-30) 100 UNIT/ML FlexPen Inject 25 Units into the skin 2 (two) times daily with a meal.      pregabalin (LYRICA) 25 MG capsule      torsemide (DEMADEX) 20 MG tablet Take 20 mg by mouth daily.     ULTICARE MINI PEN NEEDLES 31G X 6 MM MISC   0   UNABLE TO FIND Diabetic Supplies:  Glucometer, test strips, lancets. Check glucose BID (fasting and 2 hours after eating). One month supply. Refills PRN.     No current facility-administered medications on file prior to visit.    No Known Allergies  Objective: There were no vitals filed for this visit.  Gen NAD  Derm: Partial thickness ulceration sub met 1 on left foot that measures 2 x 1.5 x 0.3cm  with keratosis fiber granular base, localized odor that improves after I cleaned the wound, no active drainage, no redness, no warmth, no acute signs of infection. Neurovascular unchanged from prior Ortho: S/p digital amps  X-rays no acute osteomyelitis noted at area of ulceration plantar  first metatarsal head patient is status post previous amputation and met head resections at #2 and 3  Assessment and Plan:  Problem List Items Addressed This Visit       Endocrine   Type 2 diabetes mellitus with peripheral neuropathy ([place])   Diabetic foot ulcer ([place]) - Primary   Relevant Orders   WOUND CULTURE   Other Visit Diagnoses     Amputated toe of left foot ([place])       Prominent metatarsal head of left foot       Left foot pain            -Patient seen and evaluated -Excisionally dedbrided ulceration at left sub met 1 to healthy bleeding borders removing nonviable tissue using a sterile chisel blade. Wound measures  post debridement as above. Wound was debrided to the level of the dermis with viable wound base exposed to promote healing. Hemostasis was achieved with manuel pressure. Patient tolerated procedure well without any discomfort or anesthesia necessary for this wound debridement.  Wound culture obtained we will call patient if he needs to start on oral antibiotics. -Applied iodine ointment and offloading padding and dry sterile dressing and instructed patient to continue with daily dressings at home consisting of same. -Continue with diabetic shoe with offloading insole if worsens return to surgical shoe however patient still continues with a normal shoe - Advised patient to go to the ER or return to office if the wound worsens or if constitutional symptoms are present. -Encourage patient to consider surgery to remove the metatarsal head to help the wound to heal however he still declines at this time -Return in 3-4 weeks for wound care and advised patient that we cannot go longer than this because of risk of infection since wound is now bigger.  [name], DPM

## 2021-07-04 LAB — WOUND CULTURE
MICRO NUMBER:: 12442318
SPECIMEN QUALITY:: ADEQUATE

## 2021-07-04 LAB — HOUSE ACCOUNT TRACKING

## 2021-08-04 ENCOUNTER — Ambulatory Visit: Payer: Medicare Other | Admitting: Sports Medicine

## 2021-08-04 ENCOUNTER — Other Ambulatory Visit: Payer: Self-pay

## 2021-08-04 DIAGNOSIS — L97521 Non-pressure chronic ulcer of other part of left foot limited to breakdown of skin: Secondary | ICD-10-CM

## 2021-08-04 DIAGNOSIS — E1142 Type 2 diabetes mellitus with diabetic polyneuropathy: Secondary | ICD-10-CM

## 2021-08-04 DIAGNOSIS — E08621 Diabetes mellitus due to underlying condition with foot ulcer: Secondary | ICD-10-CM | POA: Diagnosis not present

## 2021-08-04 DIAGNOSIS — S98132A Complete traumatic amputation of one left lesser toe, initial encounter: Secondary | ICD-10-CM

## 2021-08-04 NOTE — Progress Notes (Signed)
Subjective: [name] is a 70 y.o. male patient seen today in office for evaluation of left foot ulceration.  Patient reports he is doing okay has noticed some drainage but otherwise denies constitutional symptoms like nausea vomiting fever or chills at this time.  Patient Active Problem List   Diagnosis Date Noted   Anemia in chronic kidney disease 05/27/2020   Secondary hyperparathyroidism of renal origin ([place]) 02/25/2020   Diabetic foot ulcer ([place]) 10/07/2019   Benign hypertensive kidney disease with chronic kidney disease 06/17/2019   Proteinuria 06/17/2019   Stage 3b chronic kidney disease ([place]) 06/17/2019   History of amputation of lesser toe of left foot ([place]) 08/18/2015   Hyperlipidemia, mixed 08/18/2015   Type 2 diabetes mellitus with peripheral neuropathy ([place]) 08/18/2015   Acute CVA (cerebrovascular accident) ([place]) 08/09/2015   Acute renal failure superimposed on stage 3 chronic kidney disease ([place]) 08/09/2015   Essential hypertriglyceridemia 08/09/2015   Hypertension, essential 08/09/2015   Right-sided muscle weakness 08/09/2015   T2DM (type 2 diabetes mellitus) ([place]) 08/09/2015   Tobacco abuse 08/09/2015   CKD (chronic kidney disease) 04/17/2013   DM2 (diabetes mellitus, type 2) ([place]) 04/17/2013   HTN (hypertension) 04/17/2013   Osteomyelitis of toe of left foot ([place]) 04/17/2013    Current Outpatient Medications on File Prior to Visit  Medication Sig Dispense Refill   amitriptyline (ELAVIL) 25 MG tablet Take 25 mg by mouth at bedtime.      amLODipine (NORVASC) 10 MG tablet Take 10 mg by mouth daily.      aspirin EC 81 MG tablet Take 81 mg by mouth daily. Swallow whole.     atorvastatin (LIPITOR) 40 MG tablet Take 40 mg by mouth at bedtime.     atorvastatin (LIPITOR) 80 MG tablet Take 80 mg by mouth daily.      Blood Glucose Monitoring Suppl (TRUE METRIX METER) w/Device KIT   0   Dulaglutide (TRULICITY) 3 KF/8.4CR SOPN Inject 3 mg into the skin every Wednesday.      DULoxetine (CYMBALTA) 30 MG capsule Take 30 mg by mouth daily.     glucose blood test strip Use 2 (two) times daily.     Lancets (ONETOUCH ULTRASOFT) lancets by Does not apply route.     losartan (COZAAR) 100 MG tablet Take 100 mg by mouth daily.      NOVOLOG MIX 70/30 FLEXPEN (70-30) 100 UNIT/ML FlexPen Inject 25 Units into the skin 2 (two) times daily with a meal.      pregabalin (LYRICA) 25 MG capsule      torsemide (DEMADEX) 20 MG tablet Take 20 mg by mouth daily.     ULTICARE MINI PEN NEEDLES 31G X 6 MM MISC   0   UNABLE TO FIND Diabetic Supplies:  Glucometer, test strips, lancets. Check glucose BID (fasting and 2 hours after eating). One month supply. Refills PRN.     No current facility-administered medications on file prior to visit.    No Known Allergies  Objective: There were no vitals filed for this visit.  Gen NAD  Derm: Partial thickness ulceration sub met 1 on left foot that measures 1.5 x 1 x 0.3cm  with keratosis fiber granular base, localized odor that improves after I cleaned the wound, no active drainage, no redness, no warmth, no acute signs of infection. Neurovascular unchanged from prior Ortho: S/p digital amps   Assessment and Plan:  Problem List Items Addressed This Visit       Endocrine   Type 2  diabetes mellitus with peripheral neuropathy ([facility])   Diabetic foot ulcer ([place]) - Primary   Other Visit Diagnoses     Amputated toe of left foot ([place])            -Patient seen and evaluated -Excisionally dedbrided ulceration at left sub met 1 to healthy bleeding borders removing nonviable tissue using a sterile chisel blade. Wound measures post debridement as above. Wound was debrided to the level of the dermis with viable wound base exposed to promote healing. Hemostasis was achieved with manuel pressure. Patient tolerated procedure well without any discomfort or anesthesia necessary for this wound debridement.  Wound culture obtained we will call patient  if he needs to start on oral antibiotics. -Applied Silvadene cream and offloading padding and dry sterile dressing and instructed patient to continue with daily dressings at home consisting of same. -Continue with diabetic shoe with offloading insole  - Advised patient to go to the ER or return to office if the wound worsens or if constitutional symptoms are present. -Return in 3-4 weeks for wound care and advised patient that I think eventually he is going to need surgery because this will be difficult to heal without surgery versus seeing what other options are available by going to the wound center.  Patient reports that he will think about these options and let me know what he decides next visit. [name], DPM

## 2021-09-01 ENCOUNTER — Ambulatory Visit: Payer: Medicare Other | Admitting: Sports Medicine

## 2021-09-01 ENCOUNTER — Other Ambulatory Visit: Payer: Self-pay

## 2021-09-01 ENCOUNTER — Encounter: Payer: Self-pay | Admitting: Sports Medicine

## 2021-09-01 DIAGNOSIS — L97521 Non-pressure chronic ulcer of other part of left foot limited to breakdown of skin: Secondary | ICD-10-CM | POA: Diagnosis not present

## 2021-09-01 DIAGNOSIS — E08621 Diabetes mellitus due to underlying condition with foot ulcer: Secondary | ICD-10-CM | POA: Diagnosis not present

## 2021-09-01 DIAGNOSIS — E1142 Type 2 diabetes mellitus with diabetic polyneuropathy: Secondary | ICD-10-CM

## 2021-09-01 DIAGNOSIS — S98132A Complete traumatic amputation of one left lesser toe, initial encounter: Secondary | ICD-10-CM

## 2021-09-01 NOTE — Progress Notes (Signed)
Subjective: [name] is a 70 y.o. male patient seen today in office for follow-up evaluation of left foot ulceration.  Patient reports he is doing fine and states that it looks somewhat better less swelling no redness minimal drainage has been using Silvadene 2-3 times per week as previously directed.  Patient denies nausea vomiting fever chills or any constitutional symptoms at this time.  Patient request handicap placard.  Fasting blood sugar not recorded  Patient Active Problem List   Diagnosis Date Noted   Anemia in chronic kidney disease 05/27/2020   Secondary hyperparathyroidism of renal origin ([name]) 02/25/2020   Diabetic foot ulcer ([name]) 10/07/2019   Benign hypertensive kidney disease with chronic kidney disease 06/17/2019   Proteinuria 06/17/2019   Stage 3b chronic kidney disease ([name]) 06/17/2019   History of amputation of lesser toe of left foot ([name]) 08/18/2015   Hyperlipidemia, mixed 08/18/2015   Type 2 diabetes mellitus with peripheral neuropathy ([name]) 08/18/2015   Acute CVA (cerebrovascular accident) ([name]) 08/09/2015   Acute renal failure superimposed on stage 3 chronic kidney disease ([name]) 08/09/2015   Essential hypertriglyceridemia 08/09/2015   Hypertension, essential 08/09/2015   Right-sided muscle weakness 08/09/2015   T2DM (type 2 diabetes mellitus) ([name]) 08/09/2015   Tobacco abuse 08/09/2015   CKD (chronic kidney disease) 04/17/2013   DM2 (diabetes mellitus, type 2) ([name]) 04/17/2013   HTN (hypertension) 04/17/2013   Osteomyelitis of toe of left foot ([name]) 04/17/2013    Current Outpatient Medications on File Prior to Visit  Medication Sig Dispense Refill   amitriptyline (ELAVIL) 25 MG tablet Take 25 mg by mouth at bedtime.      amLODipine (NORVASC) 10 MG tablet Take 10 mg by mouth daily.      aspirin EC 81 MG tablet Take 81 mg by mouth daily. Swallow whole.     atorvastatin (LIPITOR) 40 MG tablet Take 40 mg by mouth at bedtime.     atorvastatin (LIPITOR)  80 MG tablet Take 80 mg by mouth daily.      Blood Glucose Monitoring Suppl (TRUE METRIX METER) w/Device KIT   0   Dulaglutide (TRULICITY) 3 KC/0.0LK SOPN Inject 3 mg into the skin every Wednesday.     DULoxetine (CYMBALTA) 30 MG capsule Take 30 mg by mouth daily.     glucose blood test strip Use 2 (two) times daily.     Lancets (ONETOUCH ULTRASOFT) lancets by Does not apply route.     losartan (COZAAR) 100 MG tablet Take 100 mg by mouth daily.      NOVOLOG MIX 70/30 FLEXPEN (70-30) 100 UNIT/ML FlexPen Inject 25 Units into the skin 2 (two) times daily with a meal.      pregabalin (LYRICA) 25 MG capsule      torsemide (DEMADEX) 20 MG tablet Take 20 mg by mouth daily.     ULTICARE MINI PEN NEEDLES 31G X 6 MM MISC   0   UNABLE TO FIND Diabetic Supplies:  Glucometer, test strips, lancets. Check glucose BID (fasting and 2 hours after eating). One month supply. Refills PRN.     No current facility-administered medications on file prior to visit.    No Known Allergies  Objective: There were no vitals filed for this visit.  Gen NAD  Derm: Partial thickness ulceration sub met 1 on left foot that measures 1.0 x 0.6 x 0.3cm postdebridement with keratosis margins and fibrogranular base, mild surrounding maceration periwound, no active drainage, no redness, no warmth, no other acute signs of infection. Neurovascular unchanged  from prior Ortho: S/p digital amps   Assessment and Plan:  Problem List Items Addressed This Visit       Endocrine   Type 2 diabetes mellitus with peripheral neuropathy ([name])   Diabetic foot ulcer ([name]) - Primary   Other Visit Diagnoses     Amputated toe of left foot ([name])            -Patient seen and evaluated -Excisionally dedbrided ulceration at left sub met 1 to healthy bleeding borders removing nonviable tissue using a sterile chisel blade. Wound measures post debridement as above. Wound was debrided to the level of the dermis with viable wound base exposed  to promote healing. Hemostasis was achieved with manuel pressure. Patient tolerated procedure well without any discomfort or anesthesia necessary for this wound debridement.  Wound culture obtained we will call patient if he needs to start on oral antibiotics. -Applied Betadine to the perimeter and Silvadene cream and offloading padding and dry sterile dressing and instructed patient to continue with daily dressings at home consisting of same. -Continue with diabetic shoe with offloading insole  - Advised patient to go to the ER or return to office if the wound worsens or if constitutional symptoms are present. -Temporary handicap provided for 6 months -Return in 3-4 weeks for wound care and advised patient that I think eventually he is going to need surgery again this visit however he continues to want to hold off on this. [name], DPM

## 2021-10-06 ENCOUNTER — Ambulatory Visit (INDEPENDENT_AMBULATORY_CARE_PROVIDER_SITE_OTHER): Payer: Medicare HMO

## 2021-10-06 ENCOUNTER — Ambulatory Visit: Payer: Medicare HMO | Admitting: Sports Medicine

## 2021-10-06 ENCOUNTER — Other Ambulatory Visit: Payer: Self-pay

## 2021-10-06 ENCOUNTER — Encounter: Payer: Self-pay | Admitting: Sports Medicine

## 2021-10-06 DIAGNOSIS — M216X2 Other acquired deformities of left foot: Secondary | ICD-10-CM

## 2021-10-06 DIAGNOSIS — L97329 Non-pressure chronic ulcer of left ankle with unspecified severity: Secondary | ICD-10-CM | POA: Diagnosis not present

## 2021-10-06 DIAGNOSIS — L97524 Non-pressure chronic ulcer of other part of left foot with necrosis of bone: Secondary | ICD-10-CM | POA: Diagnosis not present

## 2021-10-06 DIAGNOSIS — E11621 Type 2 diabetes mellitus with foot ulcer: Secondary | ICD-10-CM | POA: Diagnosis not present

## 2021-10-06 DIAGNOSIS — L97505 Non-pressure chronic ulcer of other part of unspecified foot with muscle involvement without evidence of necrosis: Secondary | ICD-10-CM

## 2021-10-06 DIAGNOSIS — L03119 Cellulitis of unspecified part of limb: Secondary | ICD-10-CM

## 2021-10-06 DIAGNOSIS — M79672 Pain in left foot: Secondary | ICD-10-CM

## 2021-10-06 DIAGNOSIS — L02619 Cutaneous abscess of unspecified foot: Secondary | ICD-10-CM

## 2021-10-06 MED ORDER — SULFAMETHOXAZOLE-TRIMETHOPRIM 800-160 MG PO TABS: 1.0000 | ORAL_TABLET | Freq: Two times a day (BID) | ORAL | 0 refills | Status: DC

## 2021-10-06 NOTE — Progress Notes (Signed)
Subjective: [name] is a 71 y.o. male patient seen today in office for follow-up evaluation of left foot ulceration.  Patient reports over the last 2 to 3 days the wound has gotten worse with increased pain throbbing bleeding redness patient is assisted by wife this visit who states that she has been helping with his dressing change and has noticed a change with the wound including an odor when there is bleeding.  Patient denies nausea vomiting fever night sweats but did have an episode of chills.  Fasting blood sugar not recorded  Patient Active Problem List   Diagnosis Date Noted   Anemia in chronic kidney disease 05/27/2020   Secondary hyperparathyroidism of renal origin ([place]) 02/25/2020   Diabetic foot ulcer ([place]) 10/07/2019   Benign hypertensive kidney disease with chronic kidney disease 06/17/2019   Proteinuria 06/17/2019   Stage 3b chronic kidney disease ([place]) 06/17/2019   History of amputation of lesser toe of left foot ([place]) 08/18/2015   Hyperlipidemia, mixed 08/18/2015   Type 2 diabetes mellitus with peripheral neuropathy ([place]) 08/18/2015   Acute CVA (cerebrovascular accident) ([place]) 08/09/2015   Acute renal failure superimposed on stage 3 chronic kidney disease ([place]) 08/09/2015   Essential hypertriglyceridemia 08/09/2015   Hypertension, essential 08/09/2015   Right-sided muscle weakness 08/09/2015   T2DM (type 2 diabetes mellitus) ([place]) 08/09/2015   Tobacco abuse 08/09/2015   CKD (chronic kidney disease) 04/17/2013   DM2 (diabetes mellitus, type 2) ([place]) 04/17/2013   HTN (hypertension) 04/17/2013   Osteomyelitis of toe of left foot ([place]) 04/17/2013    Current Outpatient Medications on File Prior to Visit  Medication Sig Dispense Refill   amitriptyline (ELAVIL) 25 MG tablet Take 25 mg by mouth at bedtime.      amLODipine (NORVASC) 10 MG tablet Take 10 mg by mouth daily.      aspirin EC 81 MG tablet Take 81 mg by mouth daily. Swallow whole.     atorvastatin (LIPITOR)  40 MG tablet Take 40 mg by mouth at bedtime.     atorvastatin (LIPITOR) 80 MG tablet Take 80 mg by mouth daily.      Blood Glucose Monitoring Suppl (TRUE METRIX METER) w/Device KIT   0   Dulaglutide (TRULICITY) 3 VC/9.4WH SOPN Inject 3 mg into the skin every Wednesday.     DULoxetine (CYMBALTA) 30 MG capsule Take 30 mg by mouth daily.     glucose blood test strip Use 2 (two) times daily.     Lancets (ONETOUCH ULTRASOFT) lancets by Does not apply route.     losartan (COZAAR) 100 MG tablet Take 100 mg by mouth daily.      NOVOLOG MIX 70/30 FLEXPEN (70-30) 100 UNIT/ML FlexPen Inject 25 Units into the skin 2 (two) times daily with a meal.      pregabalin (LYRICA) 25 MG capsule      torsemide (DEMADEX) 20 MG tablet Take 20 mg by mouth daily.     ULTICARE MINI PEN NEEDLES 31G X 6 MM MISC   0   UNABLE TO FIND Diabetic Supplies:  Glucometer, test strips, lancets. Check glucose BID (fasting and 2 hours after eating). One month supply. Refills PRN.     No current facility-administered medications on file prior to visit.    No Known Allergies  Objective: There were no vitals filed for this visit.  Gen NAD  Derm: Full thickness ulceration sub met 1 on left foot that measures 3 x 2 x 0.5cm postdebridement with macerated keratotic margins with a  hypergranular base and bloody drainage, moderate surrounding maceration periwound, no redness, mild warmth, mild warmth, mild swelling concerning for acute infection.  Neurovascular unchanged from prior Ortho: S/p digital amps  X-rays consistent with amputation status no definite osteomyelitis at the first metatarsal head or sesamoid complex at the area of plantar ulceration   Assessment and Plan:  Problem List Items Addressed This Visit       Endocrine   Diabetic foot ulcer ([place]) - Primary   Relevant Orders   MR FOOT LEFT WO CONTRAST   Other Visit Diagnoses     Left foot pain       Relevant Orders   DG Foot Complete Left   WOUND CULTURE   MR  FOOT LEFT WO CONTRAST   Cellulitis and abscess of foot, except toes       Relevant Orders   MR FOOT LEFT WO CONTRAST   Prominent metatarsal head of left foot          -Patient seen and evaluated -X-rays reviewed -MRI ordered to evaluate for acute osteomyelitis -Excisionally dedbrided ulceration at left sub met 1 to healthy bleeding borders removing nonviable tissue using a sterile chisel blade. Wound measures post debridement as above. Wound was debrided to the level of the dermis with viable wound base exposed to promote healing. Hemostasis was achieved with manuel pressure. Patient tolerated procedure well without any discomfort or anesthesia necessary for this wound debridement.  Wound culture obtained prophylactically started patient on Bactrim until we get the culture results back -Applied silver nitrate sticks and Prisma and dry sterile dressing and instructed patient to continue with daily dressings at home consisting of same.  Wound care supplies ordered from [facility] -Continue with wedge surgical shoe weightbearing to the heel - Advised patient to go to the ER or return to office if the wound worsens or if constitutional symptoms are present -Return in 1-2 weeks after MRI is done and advised patient at this visit we will discuss surgical plan of which I wrongly advocate that he needs due to his history of chronic ulcer and prominent metatarsal as discussed with patient today and his wife. [name], DPM

## 2021-10-06 NOTE — Progress Notes (Signed)
woud

## 2021-10-09 LAB — WOUND CULTURE
MICRO NUMBER:: 12832065
SPECIMEN QUALITY:: ADEQUATE

## 2021-10-10 ENCOUNTER — Inpatient Hospital Stay (HOSPITAL_COMMUNITY): Payer: Medicare HMO

## 2021-10-10 ENCOUNTER — Emergency Department (HOSPITAL_COMMUNITY): Payer: Medicare HMO

## 2021-10-10 ENCOUNTER — Encounter (HOSPITAL_COMMUNITY)
Admission: EM | Disposition: A | Discharge status: Home Health | Payer: Self-pay | Source: Home / Self Care | Attending: Internal Medicine

## 2021-10-10 ENCOUNTER — Inpatient Hospital Stay (HOSPITAL_COMMUNITY)
Admission: EM | Admit: 2021-10-10 | Discharge: 2021-10-20 | DRG: 853 | Disposition: A | Discharge status: Home Health | Payer: Medicare HMO | Attending: Internal Medicine | Admitting: Internal Medicine

## 2021-10-10 ENCOUNTER — Other Ambulatory Visit: Payer: Self-pay

## 2021-10-10 ENCOUNTER — Inpatient Hospital Stay (HOSPITAL_COMMUNITY): Payer: Medicare HMO | Admitting: Certified Registered Nurse Anesthetist

## 2021-10-10 DIAGNOSIS — M869 Osteomyelitis, unspecified: Secondary | ICD-10-CM | POA: Diagnosis not present

## 2021-10-10 DIAGNOSIS — B964 Proteus (mirabilis) (morganii) as the cause of diseases classified elsewhere: Secondary | ICD-10-CM | POA: Diagnosis present

## 2021-10-10 DIAGNOSIS — I129 Hypertensive chronic kidney disease with stage 1 through stage 4 chronic kidney disease, or unspecified chronic kidney disease: Secondary | ICD-10-CM | POA: Diagnosis present

## 2021-10-10 DIAGNOSIS — Z89412 Acquired absence of left great toe: Secondary | ICD-10-CM

## 2021-10-10 DIAGNOSIS — D631 Anemia in chronic kidney disease: Secondary | ICD-10-CM | POA: Diagnosis present

## 2021-10-10 DIAGNOSIS — Z7982 Long term (current) use of aspirin: Secondary | ICD-10-CM | POA: Diagnosis not present

## 2021-10-10 DIAGNOSIS — Z8673 Personal history of transient ischemic attack (TIA), and cerebral infarction without residual deficits: Secondary | ICD-10-CM

## 2021-10-10 DIAGNOSIS — E875 Hyperkalemia: Secondary | ICD-10-CM | POA: Diagnosis not present

## 2021-10-10 DIAGNOSIS — L02612 Cutaneous abscess of left foot: Secondary | ICD-10-CM | POA: Diagnosis not present

## 2021-10-10 DIAGNOSIS — Z20822 Contact with and (suspected) exposure to covid-19: Secondary | ICD-10-CM | POA: Diagnosis not present

## 2021-10-10 DIAGNOSIS — E1122 Type 2 diabetes mellitus with diabetic chronic kidney disease: Secondary | ICD-10-CM | POA: Diagnosis not present

## 2021-10-10 DIAGNOSIS — S91302A Unspecified open wound, left foot, initial encounter: Secondary | ICD-10-CM | POA: Diagnosis not present

## 2021-10-10 DIAGNOSIS — E08621 Diabetes mellitus due to underlying condition with foot ulcer: Secondary | ICD-10-CM | POA: Diagnosis not present

## 2021-10-10 DIAGNOSIS — Z9049 Acquired absence of other specified parts of digestive tract: Secondary | ICD-10-CM | POA: Diagnosis not present

## 2021-10-10 DIAGNOSIS — E871 Hypo-osmolality and hyponatremia: Secondary | ICD-10-CM | POA: Diagnosis not present

## 2021-10-10 DIAGNOSIS — R7989 Other specified abnormal findings of blood chemistry: Secondary | ICD-10-CM | POA: Diagnosis not present

## 2021-10-10 DIAGNOSIS — M7989 Other specified soft tissue disorders: Secondary | ICD-10-CM | POA: Diagnosis not present

## 2021-10-10 DIAGNOSIS — N184 Chronic kidney disease, stage 4 (severe): Secondary | ICD-10-CM | POA: Diagnosis present

## 2021-10-10 DIAGNOSIS — E1152 Type 2 diabetes mellitus with diabetic peripheral angiopathy with gangrene: Secondary | ICD-10-CM | POA: Diagnosis present

## 2021-10-10 DIAGNOSIS — Z89422 Acquired absence of other left toe(s): Secondary | ICD-10-CM

## 2021-10-10 DIAGNOSIS — E1129 Type 2 diabetes mellitus with other diabetic kidney complication: Secondary | ICD-10-CM | POA: Diagnosis not present

## 2021-10-10 DIAGNOSIS — A419 Sepsis, unspecified organism: Secondary | ICD-10-CM | POA: Diagnosis not present

## 2021-10-10 DIAGNOSIS — Z79899 Other long term (current) drug therapy: Secondary | ICD-10-CM

## 2021-10-10 DIAGNOSIS — Z87891 Personal history of nicotine dependence: Secondary | ICD-10-CM | POA: Diagnosis not present

## 2021-10-10 DIAGNOSIS — E872 Acidosis, unspecified: Secondary | ICD-10-CM | POA: Diagnosis present

## 2021-10-10 DIAGNOSIS — K219 Gastro-esophageal reflux disease without esophagitis: Secondary | ICD-10-CM | POA: Diagnosis present

## 2021-10-10 DIAGNOSIS — I10 Essential (primary) hypertension: Secondary | ICD-10-CM | POA: Diagnosis present

## 2021-10-10 DIAGNOSIS — Z7984 Long term (current) use of oral hypoglycemic drugs: Secondary | ICD-10-CM | POA: Diagnosis not present

## 2021-10-10 DIAGNOSIS — L97521 Non-pressure chronic ulcer of other part of left foot limited to breakdown of skin: Secondary | ICD-10-CM

## 2021-10-10 DIAGNOSIS — M86172 Other acute osteomyelitis, left ankle and foot: Secondary | ICD-10-CM | POA: Diagnosis not present

## 2021-10-10 DIAGNOSIS — E11621 Type 2 diabetes mellitus with foot ulcer: Secondary | ICD-10-CM | POA: Diagnosis not present

## 2021-10-10 DIAGNOSIS — Y92239 Unspecified place in hospital as the place of occurrence of the external cause: Secondary | ICD-10-CM | POA: Diagnosis not present

## 2021-10-10 DIAGNOSIS — Z6828 Body mass index (BMI) 28.0-28.9, adult: Secondary | ICD-10-CM

## 2021-10-10 DIAGNOSIS — Z794 Long term (current) use of insulin: Secondary | ICD-10-CM

## 2021-10-10 DIAGNOSIS — N189 Chronic kidney disease, unspecified: Secondary | ICD-10-CM | POA: Diagnosis not present

## 2021-10-10 DIAGNOSIS — L97524 Non-pressure chronic ulcer of other part of left foot with necrosis of bone: Secondary | ICD-10-CM | POA: Diagnosis not present

## 2021-10-10 DIAGNOSIS — E785 Hyperlipidemia, unspecified: Secondary | ICD-10-CM | POA: Diagnosis present

## 2021-10-10 DIAGNOSIS — L0390 Cellulitis, unspecified: Secondary | ICD-10-CM | POA: Diagnosis not present

## 2021-10-10 DIAGNOSIS — E1169 Type 2 diabetes mellitus with other specified complication: Secondary | ICD-10-CM | POA: Diagnosis present

## 2021-10-10 DIAGNOSIS — E440 Moderate protein-calorie malnutrition: Secondary | ICD-10-CM | POA: Diagnosis not present

## 2021-10-10 DIAGNOSIS — T368X5A Adverse effect of other systemic antibiotics, initial encounter: Secondary | ICD-10-CM | POA: Diagnosis not present

## 2021-10-10 DIAGNOSIS — N281 Cyst of kidney, acquired: Secondary | ICD-10-CM | POA: Diagnosis not present

## 2021-10-10 DIAGNOSIS — B954 Other streptococcus as the cause of diseases classified elsewhere: Secondary | ICD-10-CM | POA: Diagnosis present

## 2021-10-10 DIAGNOSIS — R6520 Severe sepsis without septic shock: Secondary | ICD-10-CM | POA: Diagnosis not present

## 2021-10-10 DIAGNOSIS — N179 Acute kidney failure, unspecified: Secondary | ICD-10-CM | POA: Diagnosis not present

## 2021-10-10 DIAGNOSIS — L97529 Non-pressure chronic ulcer of other part of left foot with unspecified severity: Secondary | ICD-10-CM | POA: Diagnosis present

## 2021-10-10 DIAGNOSIS — L98499 Non-pressure chronic ulcer of skin of other sites with unspecified severity: Secondary | ICD-10-CM | POA: Diagnosis not present

## 2021-10-10 DIAGNOSIS — R600 Localized edema: Secondary | ICD-10-CM | POA: Diagnosis not present

## 2021-10-10 DIAGNOSIS — A480 Gas gangrene: Secondary | ICD-10-CM | POA: Diagnosis present

## 2021-10-10 DIAGNOSIS — E1165 Type 2 diabetes mellitus with hyperglycemia: Secondary | ICD-10-CM | POA: Diagnosis not present

## 2021-10-10 HISTORY — PX: I & D EXTREMITY: SHX5045

## 2021-10-10 LAB — COMPREHENSIVE METABOLIC PANEL
ALT: 45 U/L — ABNORMAL HIGH (ref 0–44)
AST: 51 U/L — ABNORMAL HIGH (ref 15–41)
Albumin: 2.1 g/dL — ABNORMAL LOW (ref 3.5–5.0)
Alkaline Phosphatase: 131 U/L — ABNORMAL HIGH (ref 38–126)
Anion gap: 10 (ref 5–15)
BUN: 44 mg/dL — ABNORMAL HIGH (ref 8–23)
CO2: 18 mmol/L — ABNORMAL LOW (ref 22–32)
Calcium: 7.6 mg/dL — ABNORMAL LOW (ref 8.9–10.3)
Chloride: 106 mmol/L (ref 98–111)
Creatinine, Ser: 5.25 mg/dL — ABNORMAL HIGH (ref 0.61–1.24)
GFR, Estimated: 11 mL/min — ABNORMAL LOW (ref >60)
Glucose, Bld: 108 mg/dL — ABNORMAL HIGH (ref 70–99)
Potassium: 4.2 mmol/L (ref 3.5–5.1)
Sodium: 134 mmol/L — ABNORMAL LOW (ref 135–145)
Total Bilirubin: 0.3 mg/dL (ref 0.3–1.2)
Total Protein: 8 g/dL (ref 6.5–8.1)

## 2021-10-10 LAB — URINALYSIS, MICROSCOPIC (REFLEX): Bacteria, UA: NONE SEEN

## 2021-10-10 LAB — CBC WITH DIFFERENTIAL/PLATELET
Abs Immature Granulocytes: 0.71 10*3/uL — ABNORMAL HIGH (ref 0.00–0.07)
Basophils Absolute: 0.1 10*3/uL (ref 0.0–0.1)
Basophils Relative: 0 %
Eosinophils Absolute: 0.1 10*3/uL (ref 0.0–0.5)
Eosinophils Relative: 1 %
HCT: 29.7 % — ABNORMAL LOW (ref 39.0–52.0)
Hemoglobin: 9.8 g/dL — ABNORMAL LOW (ref 13.0–17.0)
Immature Granulocytes: 3 %
Lymphocytes Relative: 9 %
Lymphs Abs: 2.1 10*3/uL (ref 0.7–4.0)
MCH: 29 pg (ref 26.0–34.0)
MCHC: 33 g/dL (ref 30.0–36.0)
MCV: 87.9 fL (ref 80.0–100.0)
Monocytes Absolute: 1.7 10*3/uL — ABNORMAL HIGH (ref 0.1–1.0)
Monocytes Relative: 7 %
Neutro Abs: 20 10*3/uL — ABNORMAL HIGH (ref 1.7–7.7)
Neutrophils Relative %: 80 %
Platelets: 309 10*3/uL (ref 150–400)
RBC: 3.38 MIL/uL — ABNORMAL LOW (ref 4.22–5.81)
RDW: 14.1 % (ref 11.5–15.5)
WBC: 24.7 10*3/uL — ABNORMAL HIGH (ref 4.0–10.5)
nRBC: 0 % (ref 0.0–0.2)

## 2021-10-10 LAB — CBG MONITORING, ED
Glucose-Capillary: 112 mg/dL — ABNORMAL HIGH (ref 70–99)
Glucose-Capillary: 174 mg/dL — ABNORMAL HIGH (ref 70–99)
Glucose-Capillary: 181 mg/dL — ABNORMAL HIGH (ref 70–99)
Glucose-Capillary: 78 mg/dL (ref 70–99)
Glucose-Capillary: 90 mg/dL (ref 70–99)
Glucose-Capillary: 95 mg/dL (ref 70–99)

## 2021-10-10 LAB — LACTIC ACID, PLASMA: Lactic Acid, Venous: 1.7 mmol/L (ref 0.5–1.9)

## 2021-10-10 LAB — URINALYSIS, ROUTINE W REFLEX MICROSCOPIC
Bilirubin Urine: NEGATIVE
Glucose, UA: NEGATIVE mg/dL
Ketones, ur: NEGATIVE mg/dL
Leukocytes,Ua: NEGATIVE
Nitrite: NEGATIVE
Protein, ur: 100 mg/dL — AB
Specific Gravity, Urine: 1.015 (ref 1.005–1.030)
pH: 6 (ref 5.0–8.0)

## 2021-10-10 LAB — SEDIMENTATION RATE: Sed Rate: >140 mm/hr — ABNORMAL HIGH (ref 0–16)

## 2021-10-10 LAB — HEMOGLOBIN A1C
Hgb A1c MFr Bld: 8.3 % — ABNORMAL HIGH (ref 4.8–5.6)
Mean Plasma Glucose: 191.51 mg/dL

## 2021-10-10 LAB — GLUCOSE, CAPILLARY: Glucose-Capillary: 166 mg/dL — ABNORMAL HIGH (ref 70–99)

## 2021-10-10 LAB — RESP PANEL BY RT-PCR (FLU A&B, COVID) ARPGX2
Influenza A by PCR: NEGATIVE
Influenza B by PCR: NEGATIVE
SARS Coronavirus 2 by RT PCR: NEGATIVE

## 2021-10-10 LAB — C-REACTIVE PROTEIN: CRP: 20.7 mg/dL — ABNORMAL HIGH (ref <1.0)

## 2021-10-10 LAB — PREALBUMIN: Prealbumin: 11.1 mg/dL — ABNORMAL LOW (ref 18–38)

## 2021-10-10 LAB — HIV ANTIBODY (ROUTINE TESTING W REFLEX): HIV Screen 4th Generation wRfx: NONREACTIVE

## 2021-10-10 SURGERY — IRRIGATION AND DEBRIDEMENT EXTREMITY
Anesthesia: Monitor Anesthesia Care | Site: Foot | Laterality: Left

## 2021-10-10 MED ORDER — HYDROCODONE-ACETAMINOPHEN 5-325 MG PO TABS
1.0000 | ORAL_TABLET | Freq: Four times a day (QID) | ORAL | Status: DC | PRN
  Administered 2021-10-10 – 2021-10-20 (×18): 1 via ORAL
  Filled 2021-10-10 (×18): qty 1

## 2021-10-10 MED ORDER — LACTATED RINGERS IV BOLUS (SEPSIS): 1000.0000 mL | Freq: Once | INTRAVENOUS | Status: DC

## 2021-10-10 MED ORDER — LACTATED RINGERS IV BOLUS (SEPSIS)
1000.0000 mL | Freq: Once | INTRAVENOUS | Status: AC
  Administered 2021-10-10: 1000 mL via INTRAVENOUS

## 2021-10-10 MED ORDER — ATORVASTATIN CALCIUM 80 MG PO TABS
80.0000 mg | ORAL_TABLET | Freq: Every day | ORAL | Status: DC
  Administered 2021-10-10 – 2021-10-15 (×5): 80 mg via ORAL
  Filled 2021-10-10: qty 1
  Filled 2021-10-10: qty 2
  Filled 2021-10-10 (×4): qty 1

## 2021-10-10 MED ORDER — LINEZOLID 600 MG/300ML IV SOLN
600.0000 mg | Freq: Two times a day (BID) | INTRAVENOUS | Status: DC
  Administered 2021-10-10 – 2021-10-13 (×6): 600 mg via INTRAVENOUS
  Filled 2021-10-10 (×10): qty 300

## 2021-10-10 MED ORDER — LACTATED RINGERS IV SOLN: INTRAVENOUS | Status: DC

## 2021-10-10 MED ORDER — ACETAMINOPHEN 325 MG PO TABS
650.0000 mg | ORAL_TABLET | Freq: Four times a day (QID) | ORAL | Status: DC | PRN
  Administered 2021-10-11 – 2021-10-17 (×4): 650 mg via ORAL
  Filled 2021-10-10 (×4): qty 2

## 2021-10-10 MED ORDER — LACTATED RINGERS IV SOLN: INTRAVENOUS | Status: DC | PRN

## 2021-10-10 MED ORDER — BUPIVACAINE HCL 0.25 % IJ SOLN
INTRAMUSCULAR | Status: DC | PRN
  Administered 2021-10-10: 15 mL

## 2021-10-10 MED ORDER — ONDANSETRON HCL 4 MG/2ML IJ SOLN: 4.0000 mg | Freq: Four times a day (QID) | INTRAMUSCULAR | Status: DC | PRN

## 2021-10-10 MED ORDER — BUPIVACAINE HCL (PF) 0.25 % IJ SOLN
INTRAMUSCULAR | Status: AC
  Filled 2021-10-10: qty 30

## 2021-10-10 MED ORDER — ONDANSETRON HCL 4 MG PO TABS: 4.0000 mg | ORAL_TABLET | Freq: Four times a day (QID) | ORAL | Status: DC | PRN

## 2021-10-10 MED ORDER — INSULIN ASPART 100 UNIT/ML IJ SOLN
0.0000 [IU] | INTRAMUSCULAR | Status: DC
  Administered 2021-10-10: 1 [IU] via SUBCUTANEOUS
  Administered 2021-10-11 (×3): 2 [IU] via SUBCUTANEOUS
  Administered 2021-10-11: 1 [IU] via SUBCUTANEOUS
  Administered 2021-10-11: 2 [IU] via SUBCUTANEOUS
  Administered 2021-10-12 (×2): 1 [IU] via SUBCUTANEOUS

## 2021-10-10 MED ORDER — HEPARIN SODIUM (PORCINE) 5000 UNIT/ML IJ SOLN: 5000.0000 [IU] | Freq: Three times a day (TID) | INTRAMUSCULAR | Status: DC

## 2021-10-10 MED ORDER — DOCUSATE SODIUM 100 MG PO CAPS
200.0000 mg | ORAL_CAPSULE | Freq: Every day | ORAL | Status: DC
  Administered 2021-10-10 – 2021-10-11 (×2): 200 mg via ORAL
  Filled 2021-10-10 (×2): qty 2

## 2021-10-10 MED ORDER — HYDROMORPHONE HCL 1 MG/ML IJ SOLN
1.0000 mg | INTRAMUSCULAR | Status: DC | PRN
  Administered 2021-10-12 – 2021-10-14 (×4): 1 mg via INTRAVENOUS
  Filled 2021-10-10 (×4): qty 1

## 2021-10-10 MED ORDER — INSULIN NPH (HUMAN) (ISOPHANE) 100 UNIT/ML ~~LOC~~ SUSP: 10.0000 [IU] | Freq: Two times a day (BID) | SUBCUTANEOUS | Status: DC

## 2021-10-10 MED ORDER — 0.9 % SODIUM CHLORIDE (POUR BTL) OPTIME
TOPICAL | Status: DC | PRN
Start: 2021-10-10 — End: 2021-10-10
  Administered 2021-10-10: 1000 mL

## 2021-10-10 MED ORDER — VANCOMYCIN VARIABLE DOSE PER UNSTABLE RENAL FUNCTION (PHARMACIST DOSING): Status: DC

## 2021-10-10 MED ORDER — FENTANYL CITRATE (PF) 100 MCG/2ML IJ SOLN
INTRAMUSCULAR | Status: AC
  Filled 2021-10-10: qty 2

## 2021-10-10 MED ORDER — INSULIN ASPART 100 UNIT/ML IJ SOLN: 0.0000 [IU] | INTRAMUSCULAR | Status: DC

## 2021-10-10 MED ORDER — VANCOMYCIN HCL 1500 MG/300ML IV SOLN
1500.0000 mg | Freq: Once | INTRAVENOUS | Status: DC
  Filled 2021-10-10: qty 300

## 2021-10-10 MED ORDER — PREGABALIN 75 MG PO CAPS
75.0000 mg | ORAL_CAPSULE | Freq: Two times a day (BID) | ORAL | Status: DC
  Administered 2021-10-10 – 2021-10-20 (×18): 75 mg via ORAL
  Filled 2021-10-10 (×11): qty 1
  Filled 2021-10-10: qty 3
  Filled 2021-10-10 (×7): qty 1

## 2021-10-10 MED ORDER — ACETAMINOPHEN 650 MG RE SUPP: 650.0000 mg | Freq: Four times a day (QID) | RECTAL | Status: DC | PRN

## 2021-10-10 MED ORDER — PIPERACILLIN-TAZOBACTAM IN DEX 2-0.25 GM/50ML IV SOLN
2.2500 g | Freq: Three times a day (TID) | INTRAVENOUS | Status: DC
  Administered 2021-10-10 – 2021-10-17 (×21): 2.25 g via INTRAVENOUS
  Filled 2021-10-10 (×25): qty 50

## 2021-10-10 MED ORDER — PIPERACILLIN-TAZOBACTAM 3.375 G IVPB 30 MIN
3.3750 g | Freq: Once | INTRAVENOUS | Status: AC
Start: 2021-10-10 — End: 2021-10-10
  Administered 2021-10-10: 3.375 g via INTRAVENOUS
  Filled 2021-10-10: qty 50

## 2021-10-10 MED ORDER — ACETAMINOPHEN 500 MG PO TABS
1000.0000 mg | ORAL_TABLET | Freq: Once | ORAL | Status: AC
Start: 2021-10-10 — End: 2021-10-10
  Administered 2021-10-10: 1000 mg via ORAL
  Filled 2021-10-10: qty 2

## 2021-10-10 MED ORDER — FENTANYL CITRATE (PF) 100 MCG/2ML IJ SOLN
25.0000 ug | INTRAMUSCULAR | Status: DC | PRN
  Administered 2021-10-10 (×2): 50 ug via INTRAVENOUS

## 2021-10-10 MED ORDER — INSULIN NPH (HUMAN) (ISOPHANE) 100 UNIT/ML ~~LOC~~ SUSP
10.0000 [IU] | Freq: Two times a day (BID) | SUBCUTANEOUS | Status: DC
  Filled 2021-10-10: qty 10

## 2021-10-10 SURGICAL SUPPLY — 41 items
BAG COUNTER SPONGE SURGICOUNT (BAG) ×2 IMPLANT
BLADE AVERAGE 25X9 (BLADE) ×1 IMPLANT
BNDG ELASTIC 4X5.8 VLCR STR LF (GAUZE/BANDAGES/DRESSINGS) ×1 IMPLANT
BNDG ESMARK 4X9 LF (GAUZE/BANDAGES/DRESSINGS) IMPLANT
BNDG GAUZE ELAST 4 BULKY (GAUZE/BANDAGES/DRESSINGS) ×1 IMPLANT
CHLORAPREP W/TINT 26 (MISCELLANEOUS) ×1 IMPLANT
COVER SURGICAL LIGHT HANDLE (MISCELLANEOUS) ×2 IMPLANT
CUFF TOURN SGL QUICK 18X4 (TOURNIQUET CUFF) IMPLANT
CUFF TOURN SGL QUICK 34 (TOURNIQUET CUFF)
CUFF TRNQT CYL 34X4.125X (TOURNIQUET CUFF) IMPLANT
DRAPE U-SHAPE 47X51 STRL (DRAPES) ×2 IMPLANT
ELECT CAUTERY BLADE 6.4 (BLADE) ×1 IMPLANT
ELECT REM PT RETURN 9FT ADLT (ELECTROSURGICAL) ×2
ELECTRODE REM PT RTRN 9FT ADLT (ELECTROSURGICAL) ×1 IMPLANT
GAUZE PACKING IODOFORM 1/2 (PACKING) ×1 IMPLANT
GAUZE SPONGE 4X4 12PLY STRL (GAUZE/BANDAGES/DRESSINGS) IMPLANT
GLOVE SURG ENC MOIS LTX SZ7.5 (GLOVE) ×2 IMPLANT
GLOVE SURG UNDER POLY LF SZ7.5 (GLOVE) ×2 IMPLANT
GOWN STRL REUS W/ TWL LRG LVL3 (GOWN DISPOSABLE) ×2 IMPLANT
GOWN STRL REUS W/TWL LRG LVL3 (GOWN DISPOSABLE) ×2
HANDPIECE INTERPULSE COAX TIP (DISPOSABLE)
KIT BASIN OR (CUSTOM PROCEDURE TRAY) ×2 IMPLANT
KIT TURNOVER KIT B (KITS) ×2 IMPLANT
MANIFOLD NEPTUNE II (INSTRUMENTS) ×2 IMPLANT
NDL BIOPSY JAMSHIDI 8X6 (NEEDLE) IMPLANT
NDL HYPO 25GX1X1/2 BEV (NEEDLE) IMPLANT
NEEDLE BIOPSY JAMSHIDI 8X6 (NEEDLE) IMPLANT
NEEDLE HYPO 25GX1X1/2 BEV (NEEDLE) IMPLANT
NS IRRIG 1000ML POUR BTL (IV SOLUTION) ×2 IMPLANT
PACK ORTHO EXTREMITY (CUSTOM PROCEDURE TRAY) ×2 IMPLANT
PAD ARMBOARD 7.5X6 YLW CONV (MISCELLANEOUS) ×4 IMPLANT
PROBE DEBRIDE SONICVAC MISONIX (TIP) IMPLANT
SET CYSTO W/LG BORE CLAMP LF (SET/KITS/TRAYS/PACK) ×2 IMPLANT
SET HNDPC FAN SPRY TIP SCT (DISPOSABLE) ×1 IMPLANT
SOL PREP POV-IOD 4OZ 10% (MISCELLANEOUS) ×4 IMPLANT
SUT ETHILON 3 0 FSL (SUTURE) ×2 IMPLANT
SYR CONTROL 10ML LL (SYRINGE) IMPLANT
TOWEL GREEN STERILE (TOWEL DISPOSABLE) ×2 IMPLANT
TOWEL GREEN STERILE FF (TOWEL DISPOSABLE) ×2 IMPLANT
TUBE CONNECTING 12X1/4 (SUCTIONS) ×2 IMPLANT
YANKAUER SUCT BULB TIP NO VENT (SUCTIONS) ×2 IMPLANT

## 2021-10-10 NOTE — ED Provider Notes (Signed)
[facility] Emergency Department Provider Note MRN:  [id number]  Arrival date & time: 10/10/21     Chief Complaint   Wound Infection   History of Present Illness   [name] is a 71 y.o. year-old male presents to the ED with chief complaint of diabetic foot infection.  Has history of osteomyelitis.  States that he has been having increased pain and drainage from the left foot.  He has an ulceration on the bottom of the foot.  States that he has been taking oral antibiotics for this.  Reports that over the past 3 days he has been having persistent fevers and increased pain and drainage.    Review of Systems  Pertinent review of systems noted in HPI.    Physical Exam   Vitals:   10/10/21 0025  BP: 104/66  Pulse: (!) 112  Resp: 17  Temp: (!) 100.7 F (38.2 C)  SpO2: 99%    CONSTITUTIONAL:  Well-appearing, NAD NEURO:  Alert and oriented x 3, CN 3-12 grossly intact EYES:  eyes equal and reactive ENT/NECK:  Supple, no stridor  CARDIO:  tachycardic, regular rhythm, appears well-perfused  PULM:  No respiratory distress,  GI/GU:  non-distended,  MSK/SPINE:  Left foot swollen with ulceration on the plantar surface 1st metatarsal draining purulent fluid SKIN:  no rash, atraumatic   *Additional and/or pertinent findings included in MDM below  Diagnostic and Interventional Summary    EKG Interpretation  Date/Time:    Ventricular Rate:    PR Interval:    QRS Duration:   QT Interval:    QTC Calculation:   R Axis:     Text Interpretation:         Labs Reviewed  CBC WITH DIFFERENTIAL/PLATELET - Abnormal; Notable for the following components:      Result Value   WBC 24.7 (*)    RBC 3.38 (*)    Hemoglobin 9.8 (*)    HCT 29.7 (*)    Neutro Abs 20.0 (*)    Monocytes Absolute 1.7 (*)    Abs Immature Granulocytes 0.71 (*)    All other components within normal limits  COMPREHENSIVE METABOLIC PANEL - Abnormal; Notable for the following  components:   Sodium 134 (*)    CO2 18 (*)    Glucose, Bld 108 (*)    BUN 44 (*)    Creatinine, Ser 5.25 (*)    Calcium 7.6 (*)    Albumin 2.1 (*)    AST 51 (*)    ALT 45 (*)    Alkaline Phosphatase 131 (*)    GFR, Estimated 11 (*)    All other components within normal limits  CBG MONITORING, ED - Abnormal; Notable for the following components:   Glucose-Capillary 112 (*)    All other components within normal limits  RESP PANEL BY RT-PCR (FLU A&B, COVID) ARPGX2  CULTURE, BLOOD (ROUTINE X 2)  CULTURE, BLOOD (ROUTINE X 2)  AEROBIC CULTURE W GRAM STAIN (SUPERFICIAL SPECIMEN)  LACTIC ACID, PLASMA    DG Foot Complete Left  Final Result      Medications  acetaminophen (TYLENOL) tablet 1,000 mg (has no administration in time range)  lactated ringers infusion (has no administration in time range)  lactated ringers bolus 1,000 mL (has no administration in time range)    And  lactated ringers bolus 1,000 mL (has no administration in time range)    And  lactated ringers bolus 1,000 mL (has no administration in time range)  Procedures  /  Critical Care .Critical Care Performed by: Montine Circle, PA-C Authorized by: Montine Circle, PA-C   Critical care provider statement:    Critical care time (minutes):  40   Critical care was necessary to treat or prevent imminent or life-threatening deterioration of the following conditions:  Sepsis   Critical care was time spent personally by me on the following activities:  Development of treatment plan with patient or surrogate, discussions with consultants, evaluation of patient's response to treatment, examination of patient, ordering and review of laboratory studies, ordering and review of radiographic studies, ordering and performing treatments and interventions, pulse oximetry, re-evaluation of patient's condition and review of old charts  ED Course and Medical Decision Making  I have reviewed the triage vital signs, the nursing  notes, and pertinent available records from the EMR.  Complexity of Problems Addressed Acute illness or injury that poses threat of life of bodily function  Additional Data Reviewed and Analyzed Further history obtained from: Past medical history and medications listed in the EMR, Prior ED visit notes, Recent PCP notes, and Care Everywhere    ED Course   Patient here with fever and foot pain with known wound and prior osteo.   He presents septic with fever, tachycardia, and known infection  I personally reviewed the labs and x-ray.  His labs are notable for significant leukocytosis to 24.  X-ray shows concern for osteo with ulceration.  Will need admission due to sepsis and osteo.  May need OR treatment.    I consulted with Dr. Alcario Drought, who is appreciated for admitting the patient.     Final Clinical Impressions(s) / ED Diagnoses     ICD-10-CM   1. Other acute osteomyelitis of left foot ([place])  M86.172     2. Sepsis, due to unspecified organism, unspecified whether acute organ dysfunction present [facility])  A41.9       ED Discharge Orders     None        Discharge Instructions Discussed with and Provided to Patient:   Discharge Instructions   None      Montine Circle, PA-C 10/10/21 0407    Jeanell Sparrow, DO 10/10/21 [phone]

## 2021-10-10 NOTE — Progress Notes (Signed)
Elink following code sepsis °

## 2021-10-10 NOTE — ED Notes (Signed)
Patient transported to vascular. 

## 2021-10-10 NOTE — H&P (Signed)
History and Physical    [name] LPF:[id number] DOB: 1951/09/09 DOA: 10/10/2021  PCP: [facility]  Patient coming from: Home  I have personally briefly reviewed patient's old medical records in [place]  Chief Complaint: Foot infection  HPI: [name] is a 71 y.o. male with medical history significant of DM2, CKD stage 4 (GFR 17 as of Nov), HTN.  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  Pt with ongoing ulceration of ball area of L foot (see photo below).  Seeing podiatry for this (last saw in office 1/5).  Podiatrist concerned due to X ray showing ? Osteo of the bone under ulcer area, had ordered MRI, started pt on bactrim.  Wound culture on 1/5 showing Citrobacter koseri, pan sensitive including to bactrim.  Despite ABx.  Pt developed pain extending up the medial and plantar aspect of his foot, purulent discharge, fever up to 104.x at home, fever is improved with tylenol he took before coming to ED.   ED Course: Tm in ED now 100.7  HR 112, WBC 24.7k  Creat 5.2, BUN 44, BICARB 18.  X ray again worrisome for osteomyelitis of 1st metatarsal.  But also concerning for subq air tracking up the plantar and medial aspect of mid and hindfoot.   Past Medical History:  Diagnosis Date   Chronic kidney disease    stage 4-Cr 3.3 11-19-19   Diabetes mellitus without complication ([place])    Diabetic ulcer of foot associated with diabetes mellitus due to underlying condition, limited to breakdown of skin ([place])    left   GERD (gastroesophageal reflux disease)    Hyperlipidemia    Hypertension    Non compliance w medication regimen    Stroke ([place]) 2016   no deficits    Past Surgical History:  Procedure Laterality Date   AMPUTATION OF REPLICATED TOES Left 5329   left big toe, secont toe   APPENDECTOMY     BIOPSY  08/10/2020   Procedure: BIOPSY;  Surgeon: Harvel Quale, MD;  [facility];  Service:  Gastroenterology;;   CHOLECYSTECTOMY     COLON SURGERY     COLONOSCOPY WITH PROPOFOL N/A 08/10/2020   Procedure: COLONOSCOPY WITH PROPOFOL;  Surgeon: Harvel Quale, MD;  [facility];  Service: Gastroenterology;  Laterality: N/A;  924   GRAFT APPLICATION Left 11/08/8339   Procedure: GRAFT APPLICATION;  Surgeon: [name], DPM;  Location: [facility];  Service: Podiatry;  Laterality: Left;   HERNIA REPAIR     METATARSAL HEAD EXCISION Left 12/08/2019   Procedure: METATARSAL HEAD RECESSION THIRD LEFT;  Surgeon: [name], DPM;  Location: [facility];  Service: Podiatry;  Laterality: Left;   POLYPECTOMY  08/10/2020   Procedure: POLYPECTOMY;  Surgeon: Harvel Quale, MD;  [facility];  Service: Gastroenterology;;   WOUND DEBRIDEMENT Left 12/08/2019   Procedure: DEBRIDEMENT WOUND;  Surgeon: [name], DPM;  Location: [facility];  Service: Podiatry;  Laterality: Left;     reports that he has quit smoking. He has never used smokeless tobacco. He reports that he does not currently use alcohol. He reports that he does not use drugs.  No Known Allergies  No family history on file.  Prior to Admission medications   Medication Sig Start Date End Date Taking? Authorizing Provider  amLODipine (NORVASC) 10 MG tablet Take 10 mg by mouth daily.  10/06/16  Yes [provider]  aspirin EC 81 MG tablet Take 81 mg by mouth daily. Swallow whole.   Yes [provider]  atorvastatin (LIPITOR) 80 MG tablet Take 80 mg by mouth daily.  09/16/15  Yes [provider]  Blood Glucose Monitoring Suppl (TRUE METRIX METER) w/Device KIT  07/12/18  Yes [provider]  docusate sodium (COLACE) 100 MG capsule Take 200 mg by mouth daily.   Yes [provider]  Dulaglutide 4.5 MG/0.5ML SOPN Inject 4.5 mg into the skin every Wednesday.   Yes [provider]  glucose blood test  strip daily. 01/27/16  Yes [provider]  HYDROcodone-acetaminophen (NORCO/VICODIN) 5-325 MG tablet Take 1 tablet by mouth every 6 (six) hours as needed for moderate pain.   Yes [provider]  Lancets (ONETOUCH ULTRASOFT) lancets daily. 02/02/16  Yes [provider]  NOVOLOG MIX 70/30 FLEXPEN (70-30) 100 UNIT/ML FlexPen Inject 32 Units into the skin 2 (two) times daily with a meal. 12/17/19  Yes [provider]  pregabalin (LYRICA) 75 MG capsule Take 75 mg by mouth 2 (two) times daily. 10/15/20  Yes [provider]  sulfamethoxazole-trimethoprim (BACTRIM DS) 800-160 MG tablet Take 1 tablet by mouth 2 (two) times daily. Patient taking differently: Take 1 tablet by mouth See admin instructions. Bid x 14 days 10/06/21  Yes Stover, Titorya, DPM  torsemide (DEMADEX) 20 MG tablet Take 20 mg by mouth daily. 08/04/20  Yes [provider]  ULTICARE MINI PEN NEEDLES 31G X 6 MM [place]  08/23/18  Yes [provider]  UNABLE TO FIND daily. 08/18/15  Yes [provider]    Physical Exam: Vitals:   10/10/21 0025 10/10/21 0026  BP: 104/66   Pulse: (!) 112   Resp: 17   Temp: (!) 100.7 F (38.2 C)   TempSrc: Oral   SpO2: 99%   Weight:  93 kg  Height:  6' (1.829 m)    Constitutional: NAD, non-toxic appearing Skin:    Labs on Admission: I have personally reviewed following labs and imaging studies  CBC: Recent Labs  Lab 10/10/21 0028  WBC 24.7*  NEUTROABS 20.0*  HGB 9.8*  HCT 29.7*  MCV 87.9  PLT 765   Basic Metabolic Panel: Recent Labs  Lab 10/10/21 0028  NA 134*  K 4.2  CL 106  CO2 18*  GLUCOSE 108*  BUN 44*  CREATININE 5.25*  CALCIUM 7.6*   GFR: Estimated Creatinine Clearance: 14.8 mL/min (A) (by C-G formula based on SCr of 5.25 mg/dL (H)). Liver Function Tests: Recent Labs  Lab 10/10/21 0028  AST 51*  ALT 45*  ALKPHOS 131*  BILITOT 0.3  PROT 8.0  ALBUMIN 2.1*   No results for input(s): LIPASE,  AMYLASE in the last 168 hours. No results for input(s): AMMONIA in the last 168 hours. Coagulation Profile: No results for input(s): INR, PROTIME in the last 168 hours. Cardiac Enzymes: No results for input(s): CKTOTAL, CKMB, CKMBINDEX, TROPONINI in the last 168 hours. BNP (last 3 results) No results for input(s): PROBNP in the last 8760 hours. HbA1C: No results for input(s): HGBA1C in the last 72 hours. CBG: Recent Labs  Lab 10/10/21 0046  GLUCAP 112*   Lipid Profile: No results for input(s): CHOL, HDL, LDLCALC, TRIG, CHOLHDL, LDLDIRECT in the last 72 hours. Thyroid Function Tests: No results for input(s): TSH, T4TOTAL, FREET4, T3FREE, THYROIDAB in the last 72 hours. Anemia Panel: No results for input(s): VITAMINB12, FOLATE, FERRITIN, TIBC, IRON, RETICCTPCT in the  last 72 hours. Urine analysis: No results found for: COLORURINE, APPEARANCEUR, LABSPEC, [place], GLUCOSEU, [place], BILIRUBINUR, KETONESUR, PROTEINUR, UROBILINOGEN, NITRITE, LEUKOCYTESUR  Radiological Exams on Admission: DG Foot Complete Left  Result Date: 10/10/2021 CLINICAL DATA:  Infection, concern for osteomyelitis. EXAM: LEFT FOOT - COMPLETE 3+ VIEW COMPARISON:  10/06/2021. FINDINGS: There has been amputation of the first and second digits and heads of the second and third metatarsals. Soft tissue swelling is seen predominantly in the medial aspect of the midfoot and plantar aspect of the hindfoot. There is deformity of the skin along the base of the forefoot, possible ulceration. No acute fracture or dislocation. Bony erosions are present along the inferior aspect of the presumed first metatarsal on the lateral view. Mild calcaneal spurring is noted. Vascular calcifications are present in the soft tissues. IMPRESSION: 1. Soft tissue swelling with subcutaneous air in the medial and plantar aspect of the mid and hindfoot, compatible with history of infection. 2. Suspected ulceration along the plantar aspect of the forefoot.  There are bony erosions along the plantar aspect of the presumed first metatarsal on the lateral view, concerning for osteomyelitis. MRI is suggested for further evaluation. Electronically Signed   By: Brett Fairy M.D.   On: 10/10/2021 01:29    EKG: Independently reviewed.  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 Empiric zosyn + LZD MRSA coverage appropriate as: Pt just had wound care x4 days ago Am concerned there may be more than just the cultured citrobacter causing infection as he has clearly failed bactrim. Would rather not use empiric vanc due to very severe renal disease (see below). Call ID in AM to see if we can keep him on LZD IVF: 1L bolus in ED then 150 cc/hr LR Lactate nl BCx ordered LE wound pathway NPO Call podiatry in AM DM2 - Sensitive SSI Q4H Hold home 70/30 NPH 10u BID Hold home hypoglycemics for the moment HTN - Holding amlodipine in setting of sepsis AKI on CKD 4 - IVF as above Hold all nephrotoxic meds Am concerned however that this may represent progression of CKD 4 which was already bordering on CKD 5 with GFR of 17 in Nov per patient, to CKD 5. Therefore, getting nephro consult in AM (sent message to Dr. Joelyn Oms) Strict intake and output Repeat BMP tomorrow AM  DVT prophylaxis: SCDs (delay chemo ppx till surgeons can eval in AM and decide on timing of OR intervention) Code Status: Full Family Communication: Wife at bedside Disposition Plan: TBD Consults called:  Secure Chat sent to Dr. Joelyn Oms Admission status: Admit to inpatient  Severity of Illness: The appropriate patient status for this patient is INPATIENT. Inpatient status is judged to be reasonable and necessary in order to provide the required intensity of service to ensure the patient's safety. The patient's presenting symptoms, physical exam findings, and initial radiographic and laboratory data in the context of their chronic comorbidities is felt to place them at high risk for further clinical deterioration. Furthermore, it is  not anticipated that the patient will be medically stable for discharge from the hospital within 2 midnights of admission.   * I certify that at the point of admission it is my clinical judgment that the patient will require inpatient hospital care spanning beyond 2 midnights from the point of admission due to high intensity of service, high risk for further deterioration and high frequency of surveillance required.*   Ryer Asato M. DO [facility]  How to contact the [name] Attending or Consulting provider [place] or covering provider during after hours [place], for this patient?  Check the care team in [facility] and look for a) attending/consulting [facility] provider listed and b) the [facility] team listed Log into [email or web address]  [employer] Physician Scheduling and messaging for groups and whole hospitals  On call and physician scheduling software for group practices, residents, hospitalists and other medical providers for call, clinic, rotation and shift schedules. On[employer] is a hospital-wide system for scheduling doctors and paging doctors on call. EasyPlot is for scientific plotting and data analysis.  [email or web address]  and use [place]'s universal password to access. If you do not have the password, please contact the hospital operator.  Locate the [facility] provider you are looking for under [facility] and page to a number that you can be directly reached. If you still  have difficulty reaching the provider, please page the [facility] (Director on Call) for the Hospitalists listed on amion for assistance.  10/10/2021, 3:15 AM

## 2021-10-10 NOTE — Consult Note (Signed)
Reason for Consult:Ostoemyelitis left foot Referring Physician: Chais Fehringer is an 71 y.o. male.  HPI: Patient known to Podiatry with chronic left foot ulcer. He last saw Dr. Cannon Kettle 1/5 where he was noted to have infection to the left foot and given Bactrim with plans for MRI to evaluate for osteomyelitis. He presented to the ED early this AM with worsened pain, drainage to the wound and with fevers for 3 days. Resting comfortably in bed.  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   left big toe, secont toe   APPENDECTOMY     BIOPSY  08/10/2020   Procedure: BIOPSY;  Surgeon: Harvel Quale, MD;  [facility];  Service: Gastroenterology;;   CHOLECYSTECTOMY     COLON SURGERY     COLONOSCOPY WITH PROPOFOL N/A 08/10/2020   Procedure: COLONOSCOPY WITH PROPOFOL;  Surgeon: Harvel Quale, MD;  [facility];  Service: Gastroenterology;  Laterality: N/A;  625   GRAFT APPLICATION Left 03/04/8936   Procedure: GRAFT APPLICATION;  Surgeon: [name], DPM;  Location: [facility];  Service: Podiatry;  Laterality: Left;   HERNIA REPAIR     METATARSAL HEAD EXCISION Left 12/08/2019   Procedure: METATARSAL HEAD RECESSION THIRD LEFT;  Surgeon: [name], DPM;  Location: [facility] [place];  Service: Podiatry;  Laterality: Left;   POLYPECTOMY  08/10/2020   Procedure: POLYPECTOMY;  Surgeon: Harvel Quale, MD;  [facility];  Service:  Gastroenterology;;   WOUND DEBRIDEMENT Left 12/08/2019   Procedure: DEBRIDEMENT WOUND;  Surgeon: [name], DPM;  Location: [facility];  Service: Podiatry;  Laterality: Left;   No family history on file.  Social History:  reports that he has quit smoking. He has never used smokeless tobacco. He reports that he does not currently use alcohol. He reports that he does not use drugs.  Allergies: No Known Allergies  Medications: I have reviewed the patient's current medications.  Results for orders placed or performed during the hospital encounter of 10/10/21 (from the past 48 hour(s))  CBC with Differential     Status: Abnormal   Collection Time: 10/10/21 12:28 AM  Result Value Ref Range   WBC 24.7 (H) 4.0 - 10.5 K/uL   RBC 3.38 (L) 4.22 - 5.81 MIL/uL   Hemoglobin 9.8 (L) 13.0 - 17.0 g/dL   HCT 29.7 (L) 39.0 - 52.0 %   MCV 87.9 80.0 - 100.0 fL   MCH 29.0 26.0 - 34.0 pg   MCHC 33.0 30.0 - 36.0 g/dL   RDW 14.1 11.5 - 15.5 %   Platelets 309 150 - 400 K/uL   nRBC 0.0 0.0 - 0.2 %   Neutrophils Relative % 80 %   Neutro Abs 20.0 (H) 1.7 - 7.7 K/uL   Lymphocytes Relative 9 %   Lymphs Abs 2.1 0.7 - 4.0 K/uL   Monocytes Relative 7 %   Monocytes Absolute 1.7 (H) 0.1 -  1.0 K/uL   Eosinophils Relative 1 %   Eosinophils Absolute 0.1 0.0 - 0.5 K/uL   Basophils Relative 0 %   Basophils Absolute 0.1 0.0 - 0.1 K/uL   Immature Granulocytes 3 %   Abs Immature Granulocytes 0.71 (H) 0.00 - 0.07 K/uL    Comment: Performed at [facility] [address], [place], [place] [address]  Comprehensive metabolic panel     Status: Abnormal   Collection Time: 10/10/21 12:28 AM  Result Value Ref Range   Sodium 134 (L) 135 - 145 mmol/L   Potassium 4.2 3.5 - 5.1 mmol/L   Chloride 106 98 - 111 mmol/L   CO2 18 (L) 22 - 32 mmol/L   Glucose, Bld 108 (H) 70 - 99 mg/dL    Comment: Glucose reference range applies only to samples taken after fasting for at least 8 hours.   BUN 44 (H) 8 - 23  mg/dL   Creatinine, Ser 5.25 (H) 0.61 - 1.24 mg/dL   Calcium 7.6 (L) 8.9 - 10.3 mg/dL   Total Protein 8.0 6.5 - 8.1 g/dL   Albumin 2.1 (L) 3.5 - 5.0 g/dL   AST 51 (H) 15 - 41 U/L   ALT 45 (H) 0 - 44 U/L   Alkaline Phosphatase 131 (H) 38 - 126 U/L   Total Bilirubin 0.3 0.3 - 1.2 mg/dL   GFR, Estimated 11 (L) >60 mL/min    Comment: (NOTE) Calculated using the CKD-EPI Creatinine Equation (2021)    Anion gap 10 5 - 15    Comment: Performed at [facility], [place] [address], [place], [place] [address]  Lactic acid, plasma     Status: None   Collection Time: 10/10/21 12:28 AM  Result Value Ref Range   Lactic Acid, Venous 1.7 0.5 - 1.9 mmol/L    Comment: Performed at [facility] [address], [place],  [address]  Resp Panel by RT-PCR (Flu A&B, Covid) Nasopharyngeal Swab     Status: None   Collection Time: 10/10/21 12:28 AM   Specimen: Nasopharyngeal Swab; Nasopharyngeal(NP) swabs in vial transport medium  Result Value Ref Range   SARS Coronavirus 2 by RT PCR NEGATIVE NEGATIVE    Comment: (NOTE) SARS-CoV-2 target nucleic acids are NOT DETECTED.  The SARS-CoV-2 RNA is generally detectable in upper respiratory specimens during the acute phase of infection. The lowest concentration of SARS-CoV-2 viral copies this assay can detect is 138 copies/mL. A negative result does not preclude SARS-Cov-2 infection and should not be used as the sole basis for treatment or other patient management decisions. A negative result may occur with  improper specimen collection/handling, submission of specimen other than nasopharyngeal swab, presence of viral mutation(s) within the areas targeted by this assay, and inadequate number of viral copies(<138 copies/mL). A negative result must be combined with clinical observations, patient history, and epidemiological information. The expected result is Negative.  Fact Sheet for Patients:  EntrepreneurPulse.com.au  Fact  Sheet for Healthcare Providers:  IncredibleEmployment.be  This test is no t yet approved or cleared by the [place] FDA and  has been authorized for detection and/or diagnosis of SARS-CoV-2 by FDA under an Emergency Use Authorization (EUA). This EUA will remain  in effect (meaning this test can be used) for the duration of the COVID-19 declaration under Section 564(b)(1) of the Act, 21 U.S.C.section 360bbb-3(b)(1), unless the authorization is terminated  or revoked sooner.       Influenza A by PCR NEGATIVE NEGATIVE  Influenza B by PCR NEGATIVE NEGATIVE    Comment: (NOTE) The Xpert Xpress SARS-CoV-2/FLU/RSV plus assay is intended as an aid in the diagnosis of influenza from Nasopharyngeal swab specimens and should not be used as a sole basis for treatment. Nasal washings and aspirates are unacceptable for Xpert Xpress SARS-CoV-2/FLU/RSV testing.  Fact Sheet for Patients: EntrepreneurPulse.com.au  Fact Sheet for Healthcare Providers: IncredibleEmployment.be  This test is not yet approved or cleared by the [place] FDA and has been authorized for detection and/or diagnosis of SARS-CoV-2 by FDA under an Emergency Use Authorization (EUA). This EUA will remain in effect (meaning this test can be used) for the duration of the COVID-19 declaration under Section 564(b)(1) of the Act, 21 U.S.C. section 360bbb-3(b)(1), unless the authorization is terminated or revoked.  Performed at [facility], [place] [address] [place], [place] [address]   POC CBG, ED     Status: Abnormal   Collection Time: 10/10/21 12:46 AM  Result Value Ref Range   Glucose-Capillary 112 (H) 70 - 99 mg/dL    Comment: Glucose reference range applies only to samples taken after fasting for at least 8 hours.  HIV Antibody (routine testing w rflx)     Status: None   Collection Time: 10/10/21  2:48 AM  Result Value Ref Range   HIV Screen 4th  Generation wRfx Non Reactive Non Reactive    Comment: Performed at [facility], [address], [place], [place] [address]  Sedimentation rate     Status: Abnormal   Collection Time: 10/10/21  2:48 AM  Result Value Ref Range   Sed Rate >140 (H) 0 - 16 mm/hr    Comment: Performed at [facility] [address], [place], [place] [address]  C-reactive protein     Status: Abnormal   Collection Time: 10/10/21  2:48 AM  Result Value Ref Range   CRP 20.7 (H) <1.0 mg/dL    Comment: Performed at [facility] [address] [place], [place] [address]  Prealbumin     Status: Abnormal   Collection Time: 10/10/21  2:48 AM  Result Value Ref Range   Prealbumin 11.1 (L) 18 - 38 mg/dL    Comment: Performed at [facility] [address], [place], [place] [address]  Hemoglobin A1c     Status: Abnormal   Collection Time: 10/10/21  2:50 AM  Result Value Ref Range   Hgb A1c MFr Bld 8.3 (H) 4.8 - 5.6 %    Comment: (NOTE) Pre diabetes:          5.7%-6.4%  Diabetes:              >6.4%  Glycemic control for   <7.0% adults with diabetes    Mean Plasma Glucose 191.51 mg/dL    Comment: Performed at [facility] [address], [place], [place] [address]  CBG monitoring, ED     Status: None   Collection Time: 10/10/21  5:18 AM  Result Value Ref Range   Glucose-Capillary 78 70 - 99 mg/dL    Comment: Glucose reference range applies only to samples taken after fasting for at least 8 hours.  Aerobic Culture w Gram Stain (superficial specimen)     Status: None (Preliminary result)   Collection Time: 10/10/21  6:24 AM   Specimen: Wound  Result Value Ref Range   Specimen Description WOUND    Special Requests SINOSP    Gram Stain  ABUNDANT WBC PRESENT, PREDOMINANTLY PMN MODERATE GRAM POSITIVE COCCI Performed at [facility] [address] [place], [place] [address]    Culture PENDING    Report Status PENDING   CBG monitoring, ED     Status: None    Collection Time: 10/10/21  8:36 AM  Result Value Ref Range   Glucose-Capillary 90 70 - 99 mg/dL    Comment: Glucose reference range applies only to samples taken after fasting for at least 8 hours.  CBG monitoring, ED     Status: None   Collection Time: 10/10/21  1:24 PM  Result Value Ref Range   Glucose-Capillary 95 70 - 99 mg/dL    Comment: Glucose reference range applies only to samples taken after fasting for at least 8 hours.  CBG monitoring, ED     Status: Abnormal   Collection Time: 10/10/21  6:23 PM  Result Value Ref Range   Glucose-Capillary 181 (H) 70 - 99 mg/dL    Comment: Glucose reference range applies only to samples taken after fasting for at least 8 hours.    US RENAL  Result Date: 10/10/2021 CLINICAL DATA:  Acute kidney injury EXAM: RENAL / URINARY TRACT ULTRASOUND COMPLETE COMPARISON:  11/20/2018 FINDINGS: Right Kidney: Renal measurements: 9.5 x 4.9 x 5.3 cm = volume: 129.3 mL. Cortex is echogenic. Trace perinephric fluid. No hydronephrosis. Upper pole cyst measuring 3.7 cm. Midpole parapelvic cyst measuring 14 mm. Left Kidney: Renal measurements: 9 x 5.1 x 4.8 cm = volume: 110.3 mL. Cortex is echogenic. No hydronephrosis. Small cyst lower pole measuring 15 mm. Bladder: Appears normal for degree of bladder distention. Other: None. IMPRESSION: 1. Echogenic kidneys consistent with medical renal disease. No hydronephrosis 2. Simple appearing renal cysts. Electronically Signed   By: Donavan Foil M.D.   On: 10/10/2021 15:43   DG Foot Complete Left  Result Date: 10/10/2021 CLINICAL DATA:  Infection, concern for osteomyelitis. EXAM: LEFT FOOT - COMPLETE 3+ VIEW COMPARISON:  10/06/2021. FINDINGS: There has been amputation of the first and second digits and heads of the second and third metatarsals. Soft tissue swelling is seen predominantly in the medial aspect of the midfoot and plantar aspect of the hindfoot. There is deformity of the skin along the base of the forefoot, possible  ulceration. No acute fracture or dislocation. Bony erosions are present along the inferior aspect of the presumed first metatarsal on the lateral view. Mild calcaneal spurring is noted. Vascular calcifications are present in the soft tissues. IMPRESSION: 1. Soft tissue swelling with subcutaneous air in the medial and plantar aspect of the mid and hindfoot, compatible with history of infection. 2. Suspected ulceration along the plantar aspect of the forefoot. There are bony erosions along the plantar aspect of the presumed first metatarsal on the lateral view, concerning for osteomyelitis. MRI is suggested for further evaluation. Electronically Signed   By: Brett Fairy M.D.   On: 10/10/2021 01:29   VAS [place] ABI WITH/WO TBI  Result Date: 10/10/2021  LOWER EXTREMITY DOPPLER STUDY Patient Name:  JSHON IBE  Date of Exam:   10/10/2021 Medical Rec #: [id number]      Accession #:    [id number] Date of Birth: 08-07-1951      Patient Gender: M Patient Age:   36 years Exam Location:  [facility] Procedure:      VAS [place] ABI WITH/WO TBI Referring Phys: JARED GARDNER --------------------------------------------------------------------------------  Indications: Ulceration left foot. High Risk Factors: Hypertension, hyperlipidemia, Diabetes, prior CVA.  Vascular Interventions:  Lt great toe amputation. Comparison Study: no prior Performing Technologist: Archie Patten RVS  Examination Guidelines: A complete evaluation includes at minimum, Doppler waveform signals and systolic blood pressure reading at the level of bilateral brachial, anterior tibial, and posterior tibial arteries, when vessel segments are accessible. Bilateral testing is considered an integral part of a complete examination. Photoelectric Plethysmograph (PPG) waveforms and toe systolic pressure readings are included as required and additional duplex testing as needed. Limited examinations for reoccurring indications may be performed as noted.  ABI  Findings: +--------+------------------+-----+---------+--------+  Right    Rt Pressure (mmHg) Index Waveform  Comment   +--------+------------------+-----+---------+--------+  Brachial 177                      triphasic           +--------+------------------+-----+---------+--------+  PTA      182                1.03  triphasic           +--------+------------------+-----+---------+--------+  DP       152                0.86  triphasic           +--------+------------------+-----+---------+--------+ +---------+------------------+-----+---------+--------------------+  Left      Lt Pressure (mmHg) Index Waveform  Comment               +---------+------------------+-----+---------+--------------------+  Brachial  167                      triphasic                       +---------+------------------+-----+---------+--------------------+  PTA       179                1.01  biphasic                        +---------+------------------+-----+---------+--------------------+  DP        145                0.82  biphasic                        +---------+------------------+-----+---------+--------------------+  Great Toe                                    great toe amputation  +---------+------------------+-----+---------+--------------------+ +-------+-----------+-----------+------------+------------+  ABI/TBI Today's ABI Today's TBI Previous ABI Previous TBI  +-------+-----------+-----------+------------+------------+  Right   1.03                                               +-------+-----------+-----------+------------+------------+  Left    1.01                                               +-------+-----------+-----------+------------+------------+  Summary: Right: Resting right ankle-brachial index is within normal range. No evidence of significant right lower extremity arterial disease. Left: Resting left ankle-brachial index is within normal range. No evidence of significant left lower extremity arterial disease.   *  See table(s) above for measurements and observations.  Electronically signed by Servando Snare MD on 10/10/2021 at 5:55:43 PM.    Final     ROS Blood pressure (!) 171/71, pulse (!) 105, temperature 98.9 F (37.2 C), resp. rate 18, height 6' (1.829 m), weight 93 kg, SpO2 98 %.  Vitals:   10/10/21 1550 10/10/21 1823  BP: (!) 179/68 (!) 171/71  Pulse: (!) 108 (!) 105  Resp: 16 18  Temp:    SpO2: 100% 98%    General AA&O x3. Normal mood and affect.  Vascular Dorsalis pedis and posterior tibial pulses 1+ left foot  Pedal hair growth absent.  Neurologic Epicritic sensation grossly diminished.  Dermatologic (Wound) Ulcer submet 1 with purulent drainage. Fluctuance and crepitus mid-arch area with purulent drainage. This communicates with the ulcer distally. Fluid-filled bulla at the heel, intact. Fluctuance but not frank crepitus at this area. Foot edematous, skin taut.   Orthopedic: Motor intact BLE.   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 Elevated WBC, ESR, CRP -WB Status: WBAT LLE -Surgical Plan: would benefit from emergent Incision and drainage given findings of gas gangrene. Will discuss with OR regarding planning of procedure. He did eat a small meal around 7pm.  Evelina Bucy 10/10/2021, 7:31 PM   Best available via secure chat for questions or concerns.

## 2021-10-10 NOTE — Progress Notes (Signed)
ABI has been completed.   Preliminary results in CV Proc.   [name] Hawken Bielby 10/10/2021 10:26 AM

## 2021-10-10 NOTE — ED Triage Notes (Signed)
Pt states he thinks he has an infection in his foot.Pt's reports a fever off and on. Drainage noted to the foot.

## 2021-10-10 NOTE — ED Notes (Signed)
Patient transported to Ultrasound 

## 2021-10-10 NOTE — ED Notes (Addendum)
Pt has wound on left posterior foot. Wound measures 2.7cmx2.6cm. It has serosanguous drainage. The wound has pink bedding. I cleaned wound w/NS and applied a dry dressing.

## 2021-10-10 NOTE — Progress Notes (Signed)
[name] is a 71 y.o. male with medical history significant of DM2, CKD stage 4 (GFR 17 as of Nov), HTN.  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   Pt with ongoing ulceration of ball area of L foot (see photo below).  Seeing podiatry for this (last saw in office 1/5).  Podiatrist concerned due to X ray showing ? Osteo of the bone under ulcer area, had ordered MRI, started pt on bactrim.   Wound culture on 1/5 showing Citrobacter koseri, pan sensitive including to bactrim.   Despite ABx.  Pt developed pain extending up the medial and plantar aspect of his foot, purulent discharge, fever up to 104.x at home, fever is improved with tylenol he took before coming to ED.  10/10/2021: Patient was seen and examined at his bedside in the ED.  Family member present at bedside.  He reports sharp pain in his left foot intermittently 8 out of 10 and nonradiating.  Pain control in place, added IV Dilaudid for severe pain as needed.  Podiatry Dr. Cannon Kettle consulted to assist with the management of left foot osteomyelitis.  We will continue IV antibiotics empirically and continue to follow blood cultures.  Please refer to H&P dated by my partner, Dr. Alcario Drought, on 10/10/2021 for further details of the assessment and plan.

## 2021-10-10 NOTE — Consult Note (Addendum)
[facility]  HISTORY AND PHYSICAL  [name] is an 71 y.o. male.    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  Pt has been seeing podiatry for L diabetic foot ulcer with wound culture growing citrobacter koserii 10/06/21.  Was on antibiotics with Bactrim.  Xray likely osteo  Had worsening fevers, pain, and purulent d/c from foot.  Came to ED.  Blood and wound cultures obtained, started on Linezolid/ Zosyn.  ABI prelim reports normal Abis.  Cr from 08/09/20 was 4.06, now up to 5.25.  K 4.2, CO2 18.  In this setting we are asked to see.    Pt appears relatively comfortable, says his foot hurts.  Denies n/v, SOB, CP.  No NSAIDs.  Se[facility] Dr Abigail Butts at [facility].    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   left big toe, secont toe   APPENDECTOMY     BIOPSY  08/10/2020   Procedure: BIOPSY;  Surgeon: Harvel Quale, MD;  [facility];  Service: Gastroenterology;;   CHOLECYSTECTOMY     COLON SURGERY     COLONOSCOPY WITH PROPOFOL N/A 08/10/2020   Procedure: COLONOSCOPY WITH PROPOFOL;  Surgeon: Harvel Quale, MD;  [facility];  Service: Gastroenterology;  Laterality: N/A;  500   GRAFT APPLICATION Left 06/04/8181   Procedure: GRAFT APPLICATION;  Surgeon: [name], DPM;  Location: [place];  Service: Podiatry;  Laterality: Left;   HERNIA REPAIR     METATARSAL HEAD EXCISION Left 12/08/2019   Procedure: METATARSAL HEAD RECESSION THIRD LEFT;  Surgeon: [name], DPM;  Location: [place];  Service: Podiatry;  Laterality: Left;   POLYPECTOMY  08/10/2020   Procedure: POLYPECTOMY;  Surgeon: Harvel Quale, MD;  [facility];  Service: Gastroenterology;;   WOUND DEBRIDEMENT Left 12/08/2019   Procedure: DEBRIDEMENT WOUND;  Surgeon: [name], DPM;  Location: [place];  Service: Podiatry;  Laterality: Left;    Past Medical History:  Diagnosis Date   Chronic kidney disease    stage 4-Cr 3.3 11-19-19   Diabetes mellitus without complication ([place])    Diabetic ulcer of foot associated with diabetes mellitus due to underlying condition, limited to breakdown of skin ([facility])    left   GERD (gastroesophageal reflux disease)    Hyperlipidemia    Hypertension    Non compliance w medication regimen    Stroke ([place]) 2016   no deficits   Medications:  Scheduled:  atorvastatin  80 mg Oral Daily   docusate sodium  200 mg Oral Daily   insulin aspart  0-9 Units Subcutaneous Q4H   insulin NPH Human  10 Units Subcutaneous BID AC & HS  pregabalin  75 mg Oral BID    (Not in a hospital admission)   ALLERGIES:  No Known Allergies  FAM HX: No family history on file.  Social History:   reports that he has quit smoking. He has never used smokeless tobacco. He reports that he does not currently use alcohol. He reports that he does not use drugs.  ROS: ROS: all other systems reviewed and are negative except as per HPI  Blood pressure (!) 173/55, pulse (!) 104, temperature 98.9 F (37.2 C), resp. rate 18, height 6' (1.829 m), weight 93 kg, SpO2 100 %. PHYSICAL EXAM: Physical Exam GEN: NAD, sitting in bed HEENT EOMI PERRL NECK no JVD PULM clear CV RRR ABD soft EXT R foot dressed, no edema bilaterally NEURO  AAO x 3    Results for orders placed or performed during the hospital encounter of 10/10/21 (from the past 48 hour(s))  CBC with Differential     Status: Abnormal   Collection Time: 10/10/21 12:28 AM  Result Value Ref Range   WBC 24.7 (H) 4.0 - 10.5 K/uL   RBC 3.38 (L) 4.22 - 5.81 MIL/uL   Hemoglobin 9.8 (L) 13.0 - 17.0 g/dL   HCT 29.7 (L) 39.0 - 52.0 %   MCV 87.9 80.0 - 100.0 fL   MCH 29.0 26.0 - 34.0 pg   MCHC 33.0 30.0 - 36.0 g/dL   RDW 14.1 11.5 - 15.5 %   Platelets 309 150 - 400 K/uL   nRBC 0.0 0.0 - 0.2 %   Neutrophils Relative % 80 %   Neutro Abs 20.0 (H) 1.7 - 7.7 K/uL   Lymphocytes Relative 9 %   Lymphs Abs 2.1 0.7 - 4.0 K/uL   Monocytes Relative 7 %   Monocytes Absolute 1.7 (H) 0.1 - 1.0 K/uL   Eosinophils Relative 1 %   Eosinophils Absolute 0.1 0.0 - 0.5 K/uL   Basophils Relative 0 %   Basophils Absolute 0.1 0.0 - 0.1 K/uL   Immature Granulocytes 3 %   Abs Immature Granulocytes 0.71 (H) 0.00 - 0.07 K/uL    Comment: Performed at [facility], [address] [place], [place] [address]  Comprehensive metabolic panel     Status: Abnormal   Collection Time: 10/10/21 12:28 AM  Result Value Ref Range   Sodium 134 (L) 135 - 145 mmol/L   Potassium 4.2 3.5 - 5.1 mmol/L   Chloride 106 98 - 111 mmol/L   CO2 18 (L) 22 - 32 mmol/L   Glucose, Bld 108 (H) 70 - 99 mg/dL    Comment: Glucose reference range applies only to samples taken after fasting for at least 8 hours.   BUN 44 (H) 8 - 23 mg/dL   Creatinine, Ser 5.25 (H) 0.61 - 1.24 mg/dL   Calcium 7.6 (L) 8.9 - 10.3 mg/dL   Total Protein 8.0 6.5 - 8.1 g/dL   Albumin 2.1 (L) 3.5 - 5.0 g/dL   AST 51 (H) 15 - 41 U/L   ALT 45 (H) 0 - 44 U/L   Alkaline Phosphatase 131 (H) 38 - 126 U/L   Total Bilirubin 0.3 0.3 - 1.2 mg/dL   GFR, Estimated 11 (L) >60 mL/min    Comment: (NOTE) Calculated using the CKD-EPI Creatinine Equation (2021)    Anion gap 10 5 - 15    Comment: Performed at [facility], [place] [address], [place], [place] [address]  Lactic acid, plasma     Status: None  Collection Time: 10/10/21 12:28 AM  Result Value Ref Range   Lactic Acid, Venous 1.7 0.5 - 1.9 mmol/L    Comment: Performed at [facility], [place] [address] [place], [place] [address]  Resp Panel by RT-PCR (Flu A&B, Covid) Nasopharyngeal Swab     Status: None   Collection Time: 10/10/21 12:28 AM   Specimen: Nasopharyngeal Swab; Nasopharyngeal(NP) swabs in vial transport medium  Result Value Ref Range   SARS Coronavirus 2 by RT PCR NEGATIVE NEGATIVE    Comment: (NOTE) SARS-CoV-2 target nucleic acids are NOT DETECTED.  The SARS-CoV-2 RNA is generally detectable in upper respiratory specimens during the acute phase of infection. The lowest concentration of SARS-CoV-2 viral copies this assay can detect is 138 copies/mL. A negative result does not preclude SARS-Cov-2 infection and should not be used as the sole basis for treatment or other patient management decisions. A negative result may occur with  improper specimen collection/handling, submission of specimen other than nasopharyngeal swab, presence of viral mutation(s) within the areas targeted by this assay, and inadequate number of viral copies(<138 copies/mL). A negative result must be combined with clinical observations, patient history, and epidemiological information. The expected result is Negative.  Fact Sheet for Patients:  EntrepreneurPulse.com.au  Fact Sheet for Healthcare Providers:  IncredibleEmployment.be  This test is no t yet approved or cleared by the [place] FDA and  has been authorized for detection and/or diagnosis of SARS-CoV-2 by FDA under an Emergency Use Authorization (EUA). This EUA will remain  in effect (meaning this test can be used) for the duration of the COVID-19 declaration under Section 564(b)(1) of the Act, 21 U.S.C.section 360bbb-3(b)(1), unless the authorization is terminated  or  revoked sooner.       Influenza A by PCR NEGATIVE NEGATIVE   Influenza B by PCR NEGATIVE NEGATIVE    Comment: (NOTE) The Xpert Xpress SARS-CoV-2/FLU/RSV plus assay is intended as an aid in the diagnosis of influenza from Nasopharyngeal swab specimens and should not be used as a sole basis for treatment. Nasal washings and aspirates are unacceptable for Xpert Xpress SARS-CoV-2/FLU/RSV testing.  Fact Sheet for Patients: EntrepreneurPulse.com.au  Fact Sheet for Healthcare Providers: IncredibleEmployment.be  This test is not yet approved or cleared by the [place] FDA and has been authorized for detection and/or diagnosis of SARS-CoV-2 by FDA under an Emergency Use Authorization (EUA). This EUA will remain in effect (meaning this test can be used) for the duration of the COVID-19 declaration under Section 564(b)(1) of the Act, 21 U.S.C. section 360bbb-3(b)(1), unless the authorization is terminated or revoked.  Performed at [facility], [place] [address], [place], [place] [address]   POC CBG, ED     Status: Abnormal   Collection Time: 10/10/21 12:46 AM  Result Value Ref Range   Glucose-Capillary 112 (H) 70 - 99 mg/dL    Comment: Glucose reference range applies only to samples taken after fasting for at least 8 hours.  HIV Antibody (routine testing w rflx)     Status: None   Collection Time: 10/10/21  2:48 AM  Result Value Ref Range   HIV Screen 4th Generation wRfx Non Reactive Non Reactive    Comment: Performed at [facility], [address], [place], [place] [address]  Sedimentation rate     Status: Abnormal   Collection Time: 10/10/21  2:48 AM  Result Value Ref Range   Sed Rate >140 (H) 0 - 16 mm/hr    Comment: Performed at  [facility], [place] [address], [place], [place] [address]  C-reactive protein     Status: Abnormal   Collection Time: 10/10/21  2:48 AM  Result Value Ref Range   CRP 20.7 (H) <1.0 mg/dL     Comment: Performed at [facility] [address] [place], [place] [address]  Prealbumin     Status: Abnormal   Collection Time: 10/10/21  2:48 AM  Result Value Ref Range   Prealbumin 11.1 (L) 18 - 38 mg/dL    Comment: Performed at [facility] [address] [place], [place] [address]  Hemoglobin A1c     Status: Abnormal   Collection Time: 10/10/21  2:50 AM  Result Value Ref Range   Hgb A1c MFr Bld 8.3 (H) 4.8 - 5.6 %    Comment: (NOTE) Pre diabetes:          5.7%-6.4%  Diabetes:              >6.4%  Glycemic control for   <7.0% adults with diabetes    Mean Plasma Glucose 191.51 mg/dL    Comment: Performed at [facility] [address] [place], [place] [address]  CBG monitoring, ED     Status: None   Collection Time: 10/10/21  5:18 AM  Result Value Ref Range   Glucose-Capillary 78 70 - 99 mg/dL    Comment: Glucose reference range applies only to samples taken after fasting for at least 8 hours.  Aerobic Culture w Gram Stain (superficial specimen)     Status: None (Preliminary result)   Collection Time: 10/10/21  6:24 AM   Specimen: Wound  Result Value Ref Range   Specimen Description WOUND    Special Requests SINOSP    Gram Stain      ABUNDANT WBC PRESENT, PREDOMINANTLY PMN MODERATE GRAM POSITIVE COCCI Performed at [facility], [address], [place], [place] [address]    Culture PENDING    Report Status PENDING   CBG monitoring, ED     Status: None   Collection Time: 10/10/21  8:36 AM  Result Value Ref Range   Glucose-Capillary 90 70 - 99 mg/dL    Comment: Glucose reference range applies only to samples taken after fasting for at least 8 hours.  CBG monitoring, ED     Status: None   Collection Time: 10/10/21  1:24 PM  Result Value Ref Range   Glucose-Capillary 95 70 - 99 mg/dL    Comment: Glucose reference range applies only to samples taken after fasting for at least 8 hours.    DG Foot Complete Left  Result Date:  10/10/2021 CLINICAL DATA:  Infection, concern for osteomyelitis. EXAM: LEFT FOOT - COMPLETE 3+ VIEW COMPARISON:  10/06/2021. FINDINGS: There has been amputation of the first and second digits and heads of the second and third metatarsals. Soft tissue swelling is seen predominantly in the medial aspect of the midfoot and plantar aspect of the hindfoot. There is deformity of the skin along the base of the forefoot, possible ulceration. No acute fracture or dislocation. Bony erosions are present along the inferior aspect of the presumed first metatarsal on the lateral view. Mild calcaneal spurring is noted. Vascular calcifications are present in the soft tissues. IMPRESSION: 1. Soft tissue swelling with subcutaneous air in the medial and plantar aspect of the mid and hindfoot, compatible with history of infection. 2. Suspected ulceration along the plantar aspect of the forefoot. There are bony erosions along  the plantar aspect of the presumed first metatarsal on the lateral view, concerning for osteomyelitis. MRI is suggested for further evaluation. Electronically Signed   By: Brett Fairy M.D.   On: 10/10/2021 01:29   VAS [place] ABI WITH/WO TBI  Result Date: 10/10/2021  LOWER EXTREMITY DOPPLER STUDY Patient Name:  [name]  Date of Exam:   10/10/2021 Medical Rec #: [id number]      Accession #:    [id number] Date of Birth: 07-Jun-1951      Patient Gender: M Patient Age:   19 years Exam Location:  [facility] Procedure:      VAS [place] ABI WITH/WO TBI Referring Phys: JARED GARDNER --------------------------------------------------------------------------------  Indications: Ulceration left foot. High Risk Factors: Hypertension, hyperlipidemia, Diabetes, prior CVA.  Vascular Interventions: Lt great toe amputation. Comparison Study: no prior Performing Technologist: Archie Patten RVS  Examination Guidelines: A complete evaluation includes at minimum, Doppler waveform signals and systolic blood pressure reading at the  level of bilateral brachial, anterior tibial, and posterior tibial arteries, when vessel segments are accessible. Bilateral testing is considered an integral part of a complete examination. Photoelectric Plethysmograph (PPG) waveforms and toe systolic pressure readings are included as required and additional duplex testing as needed. Limited examinations for reoccurring indications may be performed as noted.  ABI Findings: +--------+------------------+-----+---------+--------+  Right    Rt Pressure (mmHg) Index Waveform  Comment   +--------+------------------+-----+---------+--------+  Brachial 177                      triphasic           +--------+------------------+-----+---------+--------+  PTA      182                1.03  triphasic           +--------+------------------+-----+---------+--------+  DP       152                0.86  triphasic           +--------+------------------+-----+---------+--------+ +---------+------------------+-----+---------+--------------------+  Left      Lt Pressure (mmHg) Index Waveform  Comment               +---------+------------------+-----+---------+--------------------+  Brachial  167                      triphasic                       +---------+------------------+-----+---------+--------------------+  PTA       179                1.01  biphasic                        +---------+------------------+-----+---------+--------------------+  DP        145                0.82  biphasic                        +---------+------------------+-----+---------+--------------------+  Great Toe                                    great toe amputation  +---------+------------------+-----+---------+--------------------+ +-------+-----------+-----------+------------+------------+  ABI/TBI Today's ABI Today's TBI Previous ABI Previous TBI  +-------+-----------+-----------+------------+------------+  Right   1.03                                                +-------+-----------+-----------+------------+------------+  Left    1.01                                               +-------+-----------+-----------+------------+------------+  Summary: Right: Resting right ankle-brachial index is within normal range. No evidence of significant right lower extremity arterial disease. Left: Resting left ankle-brachial index is within normal range. No evidence of significant left lower extremity arterial disease.  *See table(s) above for measurements and observations.     Preliminary     Assessment/Plan    AKI on CKD IV: last Cr 08/2020 was 4.0, per pt last eGFR 17% now up to 5.25 (EGFR 11%) in setting of sepsis and recent Bactrim administration.  Not able to give UA yet.   - check renal US - not uremic, no need for HD - Bactrim contraindicated in advanced CKD - hold torsemide/ ARB/ SGLT2i etc and all other nephrotoxic agents - no IV contrast unless absolutely necessary - daily weights, strict I/O   2.  Osteo of L foot with sepsis- on linezolid/ Zosyn, wound cultures and blood cultures obtained.  ABIs normal.  Anticipate ortho c/s, defer to primary  3. HTN: avoid ARB and diuretics, amlodipine (on home list) would be OK if needed  4.  DM II: per primary  5.  Dispo: admitted  Madelon Lips 10/10/2021, 2:37 PM

## 2021-10-10 NOTE — Consult Note (Signed)
WOC Nurse Consult Note: Reason for Consult:chronic nonhealing full thickness wound on plantar aspect of right foot at first metatarsal head.  Patient is followed by podiatric medicine in the community and last saw Dr. Cannon Kettle on 10/06/21. See Hospitalist's note regarding xrays concerning for osteomyelitis of 1st metatarsal and also concerning for subcutaneous air tracking.  See also photo uploaded to EHR.  This wound exceeds the scope of [facility] Nursing practice.  I can however provide Nursing with guidance for the topical care until a more definitive POC is established by Podiatry, Orthopedics or the surgical provider of choice. Wound type: Neuropathic, infectious Pressure Injury POA: N/A Dressing procedure/placement/frequency: I have provided Nursing with guidance for the daily care of this wound using a soap and water cleanse, rinse and pat dry followed by placement of a saline moistened gauze dressing followed by dry dressing and secured with Kerlix roll gauze/paper tape.   [facility] nursing team will not follow, but will remain available to this patient, the nursing and medical teams.  Please re-consult if needed. Thanks, Maudie Flakes, MSN, RN, [place], Ar[facility] Abbott  Pager# [phone]

## 2021-10-10 NOTE — Progress Notes (Signed)
Pharmacy Antibiotic Note  [name] is a 71 y.o. male admitted on 10/10/2021 with cellulitis.  Pharmacy has been consulted for Zosyn dosing.   WBC elevated, SCr elevated 5.25 but patient is not on HD.   Plan: -Zosyn 3.375 gm IV once followed by zosyn 2.25 gm IV Q 8 hours  -Linezolid x 1 dose for now until ID approval in AM in the setting of worsening CKD. Discussed with admitting MD  -Monitor renal fx, cultures and clinical progress  Height: 6' (182.9 cm) Weight: 93 kg (205 lb) IBW/kg (Calculated) : 77.6  Temp (24hrs), Avg:100.7 F (38.2 C), Min:100.7 F (38.2 C), Max:100.7 F (38.2 C)  Recent Labs  Lab 10/10/21 0028  WBC 24.7*  CREATININE 5.25*  LATICACIDVEN 1.7    Estimated Creatinine Clearance: 14.8 mL/min (A) (by C-G formula based on SCr of 5.25 mg/dL (H)).    No Known Allergies  Antimicrobials this admission: Linezolid 1/9 >> Zosyn 1/9 >>   Dose adjustments this admission:   Microbiology results: 1/9 BCx:    Thank you for allowing pharmacy to be a part of this patients care.  Albertina Parr, PharmD., BCPS, BCCCP Clinical Pharmacist Please refer to [facility] for unit-specific pharmacist

## 2021-10-10 NOTE — ED Provider Triage Note (Addendum)
Emergency Medicine Provider Triage Evaluation Note  [name] , a 71 y.o. male  was evaluated in triage.  Pt complains of foot infection.  Followed by Dr. Cannon [name] with podiatory.  States saw her in clinic this past week and started on bactrim for infection of left foot, great toe area.  States symptoms worsening-- increased drainage, fevers, etc.  Review of Systems  Positive: Foot infection, fever Negative: numbness  Physical Exam  BP 104/66 (BP Location: Right Arm)    Pulse (!) 112    Temp (!) 100.7 F (38.2 C) (Oral)    Resp 17    Ht 6' (1.829 m)    Wt 93 kg    SpO2 99%    BMI 27.80 kg/m   Gen:   Awake, no distress   Resp:  Normal effort  MSK:   Moves extremities without difficulty  Other:  Wound to remnant of left great toe, ulcerated with purulent drainage and foul odor     Medical Decision Making  Medically screening exam initiated at 12:27 AM.  Appropriate orders placed.  [name] was informed that the remainder of the evaluation will be completed by another provider, this initial triage assessment does not replace that evaluation, and the importance of remaining in the ED until their evaluation is complete.  Febrile, tachycardic with obvious source of infection in left foot.  Foul odor, purulent drainage from wound.  Will check labs, cultures, x-ray to assess for osteomyelitis.   [name] Pickett, PA-C 10/10/21 0031    [name] Pickett, PA-C 10/10/21 [phone]

## 2021-10-10 NOTE — Transfer of Care (Signed)
Immediate Anesthesia Transfer of Care Note  Patient: [name]  Procedure(s) Performed: IRRIGATION AND DEBRIDEMENT left foot (Left: Foot)  Patient Location: PACU  Anesthesia Type:local anesthesia only  Level of Consciousness: awake, alert  and oriented  Airway & Oxygen Therapy: Patient Spontanous Breathing  Post-op Assessment: Report given to RN and Post -op Vital signs reviewed and stable  Post vital signs: Reviewed and stable  Last Vitals:  Vitals Value Taken Time  BP 162/73 10/10/21 2206  Temp    Pulse 101 10/10/21 2207  Resp 15 10/10/21 2207  SpO2 97 % 10/10/21 2207  Vitals shown include unvalidated device data.  Last Pain:  Vitals:   10/10/21 0534  TempSrc:   PainSc: 3          Complications: No notable events documented.

## 2021-10-10 NOTE — Op Note (Signed)
°  Patient Name: [name] DOB: [birth year]  MRN: [id number]   Date of Surgery: 10/10/2021  Surgeon: Dr. Hardie Pulley, DPM Assistants: none  Pre-operative Diagnosis:  Gas gangrene left foot Post-operative Diagnosis:  same Procedures:  1) Incision and drainage below the deep fascia, complex - left foot Pathology/Specimens: ID Type Source Tests Collected by Time Destination  A : swab culture left foot Wound Abscess AEROBIC/ANAEROBIC CULTURE W GRAM STAIN (SURGICAL/DEEP WOUND) Evelina Bucy, DPM 10/10/2021 2149    Anesthesia: local only Hemostasis: * No tourniquets in log * Estimated Blood Loss: 10 ml Materials: * No implants in log * Medications: 15 cc 0.5% marcaine plain. Complications: none  Indications for Procedure:  This is a 71 y.o. male with a left foot infection with concern for gas gangrene. It was discussed with the patient he would benefit from urgent incision and drainage. Patient verbalized understanding and agreed to proceed.   Procedure in Detail: Patient was identified in pre-operative holding area. Formal consent was signed and the left lower extremity was marked. Patient was brought back to the operating room. Anesthesia was induced. The extremity was prepped and draped in the usual sterile fashion. Timeout was taken to confirm patient name, laterality, and procedure prior to incision.   Attention was then directed to the left foot. There was an ulcer submet 1 with purulent drainage, a fluctuant medial foot area with soft tissue crepitus, and a boggy heel area. Swab culture was performed of the  purulence. A freer was passed from the plantar ulcer to the medial wound. The communicating medial area was incised with a 15 blade with significant purulent drainage. Dissection was continued to the deep fascia. The freer was now placed at the medial wound but this did not communicate to the heel area. The foot was maximally expressed of all purulent material. This area was  then debrided of all non-viable tissue with rongeur. Debridement was carried to and including the deep fascia.  Incision was then made at the boggy area of the heel. This area had significant necrotic tissue, but no purulence. It did not communicate to the medial wound. It was rather deep but did not probe to bone. This area was debrided of all non-viable tissue with rongeur. Debridement was carried to and including the deep fascia.  All wounds were then copiously irrigated with 3L of normal saline via pulse lavage and cysto tubing. The wounds were packed with iodoform dressing. The foot was then dressed with 4x4, kerlix, and ACE bandage. Patient tolerated the procedure well.   Disposition: Following a period of post-operative monitoring, patient will be transferred to the floor. He will benefit from repeat debridement at a later date, with likely 1st metatarsal resection. Given extent of necrosis today metatarsal resection was not performed.

## 2021-10-10 NOTE — Progress Notes (Signed)
Inpatient Diabetes Program Recommendations  AACE/ADA: New Consensus Statement on Inpatient Glycemic Control (2015)  Target Ranges:  Prepandial:   less than 140 mg/dL      Peak postprandial:   less than 180 mg/dL (1-2 hours)      Critically ill patients:  140 - 180 mg/dL   Lab Results  Component Value Date   GLUCAP 95 10/10/2021   HGBA1C 8.3 (H) 10/10/2021    Review of Glycemic Control  Latest Reference Range & Units 10/10/21 00:46 10/10/21 05:18 10/10/21 08:36 10/10/21 13:24  Glucose-Capillary 70 - 99 mg/dL 112 (H) 78 90 95   Diabetes history:  DM 2 Outpatient Diabetes medications:  Novolog 70/30 32 units bid, Dulaglutide 4.5 weekly (Wednesdays) Current orders for Inpatient glycemic control:  Novolog sensitive q 4 hours NPH 10 units bid  Inpatient Diabetes Program Recommendations:    Recommend holding NPH for now.  Also consider reducing Novolog correction to "Very Sensitive"- 0-6 units q 4 hours.   Thanks,  Adah Perl, RN, BC-ADM Inpatient Diabetes Coordinator Pager [phone]  (8a-5p)

## 2021-10-10 NOTE — Anesthesia Preprocedure Evaluation (Signed)
Anesthesia Evaluation  Patient identified by MRN, date of birth, ID band Patient awake    Reviewed: Allergy & Precautions, NPO status , Patient's Chart, lab work & pertinent test results  Airway Mallampati: II  TM Distance: >3 FB Neck ROM: Full    Dental  (+) Dental Advisory Given   Pulmonary former smoker,    breath sounds clear to auscultation       Cardiovascular hypertension, Pt. on medications  Rhythm:Regular Rate:Normal     Neuro/Psych  Neuromuscular disease CVA    GI/Hepatic Neg liver ROS, GERD  ,  Endo/Other  diabetes, Type 2, Insulin Dependent  Renal/GU CRFRenal disease     Musculoskeletal Osteomyelitis    Abdominal   Peds  Hematology  (+) anemia ,   Anesthesia Other Findings   Reproductive/Obstetrics                             Anesthesia Physical Anesthesia Plan  ASA: 3 and emergent  Anesthesia Plan: MAC   Post-op Pain Management: Minimal or no pain anticipated   Induction:   PONV Risk Score and Plan: 1 and Treatment may vary due to age or medical condition  Airway Management Planned: Natural Airway and Simple Face Mask  Additional Equipment: None  Intra-op Plan:   Post-operative Plan:   Informed Consent: I have reviewed the patients History and Physical, chart, labs and discussed the procedure including the risks, benefits and alternatives for the proposed anesthesia with the patient or authorized representative who has indicated his/her understanding and acceptance.       Plan Discussed with: CRNA  Anesthesia Plan Comments:         Anesthesia Quick Evaluation

## 2021-10-11 ENCOUNTER — Encounter (HOSPITAL_COMMUNITY): Payer: Self-pay | Admitting: Podiatry

## 2021-10-11 ENCOUNTER — Inpatient Hospital Stay (HOSPITAL_COMMUNITY): Payer: Medicare HMO

## 2021-10-11 ENCOUNTER — Ambulatory Visit: Payer: Self-pay | Admitting: Podiatry

## 2021-10-11 DIAGNOSIS — R6520 Severe sepsis without septic shock: Secondary | ICD-10-CM | POA: Diagnosis not present

## 2021-10-11 DIAGNOSIS — M7989 Other specified soft tissue disorders: Secondary | ICD-10-CM | POA: Diagnosis not present

## 2021-10-11 DIAGNOSIS — S91302A Unspecified open wound, left foot, initial encounter: Secondary | ICD-10-CM | POA: Diagnosis not present

## 2021-10-11 DIAGNOSIS — A419 Sepsis, unspecified organism: Secondary | ICD-10-CM | POA: Diagnosis not present

## 2021-10-11 DIAGNOSIS — N179 Acute kidney failure, unspecified: Secondary | ICD-10-CM | POA: Diagnosis not present

## 2021-10-11 DIAGNOSIS — E11621 Type 2 diabetes mellitus with foot ulcer: Secondary | ICD-10-CM

## 2021-10-11 DIAGNOSIS — L97524 Non-pressure chronic ulcer of other part of left foot with necrosis of bone: Secondary | ICD-10-CM

## 2021-10-11 LAB — CBC
HCT: 26.2 % — ABNORMAL LOW (ref 39.0–52.0)
Hemoglobin: 8.4 g/dL — ABNORMAL LOW (ref 13.0–17.0)
MCH: 27.9 pg (ref 26.0–34.0)
MCHC: 32.1 g/dL (ref 30.0–36.0)
MCV: 87 fL (ref 80.0–100.0)
Platelets: 291 10*3/uL (ref 150–400)
RBC: 3.01 MIL/uL — ABNORMAL LOW (ref 4.22–5.81)
RDW: 14.4 % (ref 11.5–15.5)
WBC: 23.3 10*3/uL — ABNORMAL HIGH (ref 4.0–10.5)
nRBC: 0 % (ref 0.0–0.2)

## 2021-10-11 LAB — BASIC METABOLIC PANEL
Anion gap: 8 (ref 5–15)
BUN: 39 mg/dL — ABNORMAL HIGH (ref 8–23)
CO2: 20 mmol/L — ABNORMAL LOW (ref 22–32)
Calcium: 7.4 mg/dL — ABNORMAL LOW (ref 8.9–10.3)
Chloride: 104 mmol/L (ref 98–111)
Creatinine, Ser: 4.73 mg/dL — ABNORMAL HIGH (ref 0.61–1.24)
GFR, Estimated: 13 mL/min — ABNORMAL LOW (ref >60)
Glucose, Bld: 198 mg/dL — ABNORMAL HIGH (ref 70–99)
Potassium: 4.9 mmol/L (ref 3.5–5.1)
Sodium: 132 mmol/L — ABNORMAL LOW (ref 135–145)

## 2021-10-11 LAB — MRSA NEXT GEN BY PCR, NASAL: MRSA by PCR Next Gen: NOT DETECTED

## 2021-10-11 LAB — GLUCOSE, CAPILLARY
Glucose-Capillary: 107 mg/dL — ABNORMAL HIGH (ref 70–99)
Glucose-Capillary: 169 mg/dL — ABNORMAL HIGH (ref 70–99)
Glucose-Capillary: 206 mg/dL — ABNORMAL HIGH (ref 70–99)
Glucose-Capillary: 210 mg/dL — ABNORMAL HIGH (ref 70–99)
Glucose-Capillary: 213 mg/dL — ABNORMAL HIGH (ref 70–99)
Glucose-Capillary: 213 mg/dL — ABNORMAL HIGH (ref 70–99)

## 2021-10-11 MED ORDER — ADULT MULTIVITAMIN W/MINERALS CH
1.0000 | ORAL_TABLET | Freq: Every day | ORAL | Status: DC
  Administered 2021-10-11 – 2021-10-20 (×8): 1 via ORAL
  Filled 2021-10-11 (×9): qty 1

## 2021-10-11 MED ORDER — NEPRO/CARBSTEADY PO LIQD
237.0000 mL | Freq: Two times a day (BID) | ORAL | Status: DC
  Administered 2021-10-11 – 2021-10-20 (×11): 237 mL via ORAL

## 2021-10-11 MED ORDER — HEPARIN SODIUM (PORCINE) 5000 UNIT/ML IJ SOLN
5000.0000 [IU] | Freq: Three times a day (TID) | INTRAMUSCULAR | Status: DC
  Administered 2021-10-11 – 2021-10-20 (×25): 5000 [IU] via SUBCUTANEOUS
  Filled 2021-10-11 (×26): qty 1

## 2021-10-11 MED ORDER — PROSOURCE PLUS PO LIQD
30.0000 mL | Freq: Two times a day (BID) | ORAL | Status: DC
  Administered 2021-10-11 – 2021-10-20 (×17): 30 mL via ORAL
  Filled 2021-10-11 (×18): qty 30

## 2021-10-11 MED ORDER — SENNOSIDES-DOCUSATE SODIUM 8.6-50 MG PO TABS
2.0000 | ORAL_TABLET | Freq: Every day | ORAL | Status: DC
  Administered 2021-10-13 – 2021-10-20 (×7): 2 via ORAL
  Filled 2021-10-11 (×8): qty 2

## 2021-10-11 MED ORDER — CEFAZOLIN SODIUM-DEXTROSE 2-4 GM/100ML-% IV SOLN
2.0000 g | INTRAVENOUS | Status: DC
  Filled 2021-10-11: qty 100

## 2021-10-11 MED ORDER — ENSURE MAX PROTEIN PO LIQD
11.0000 [oz_av] | Freq: Every day | ORAL | Status: DC
  Administered 2021-10-11: 11 [oz_av] via ORAL

## 2021-10-11 MED ORDER — DAKINS (1/4 STRENGTH) 0.125 % EX SOLN
Freq: Every day | CUTANEOUS | Status: AC
  Filled 2021-10-11: qty 473

## 2021-10-11 NOTE — Progress Notes (Signed)
Initial Nutrition Assessment  DOCUMENTATION CODES:   Not applicable  INTERVENTION:  -Ensure Max po BID, each supplement provides 150 kcal and 30 grams of protein -MVI with minerals daily -PROSource PLUS PO 78mls BID, each supplement provides 100 kcals and 15 grams of protein  NUTRITION DIAGNOSIS:   Increased nutrient needs related to wound healing as evidenced by estimated needs.  GOAL:   Patient will meet greater than or equal to 90% of their needs   MONITOR:   PO intake, Supplement acceptance, Weight trends, Labs, I & O's  REASON FOR ASSESSMENT:   Consult Wound healing  ASSESSMENT:   Pt with PMH significant for type 2 DM, CKD stg 4, and HTN admitted with diabetic foot ulcer s/p amputation of 1st and 2nd toes on L foot.  Pt underwent I&D below the deep fascia, complex - L foot.  Although no PO intake has been documented, pt denies any recent changes to weight and/or appetite. Weight history reviewed. No significant changes noted. Pt with increased nutrient needs 2/2 wound healing so will order ONS.   No UOP documented x24 hours I/O: +2815ml since admit  Medications: Scheduled Meds:  (feeding supplement) PROSource Plus  30 mL Oral BID BM   atorvastatin  80 mg Oral Daily   heparin injection (subcutaneous)  5,000 Units Subcutaneous Q8H   insulin aspart  0-6 Units Subcutaneous Q4H   multivitamin with minerals  1 tablet Oral Daily   pregabalin  75 mg Oral BID   Ensure Max Protein  11 oz Oral Daily   senna-docusate  2 tablet Oral Daily   sodium hypochlorite   Irrigation Daily  Continuous Infusions:  lactated ringers 50 mL/hr at 10/11/21 1109   linezolid (ZYVOX) IV 600 mg (10/11/21 1518)   piperacillin-tazobactam (ZOSYN)  IV 2.25 g (10/11/21 0841)    Labs: Recent Labs  Lab 10/10/21 0028 10/11/21 0340  NA 134* 132*  K 4.2 4.9  CL 106 104  CO2 18* 20*  BUN 44* 39*  CREATININE 5.25* 4.73*  CALCIUM 7.6* 7.4*  GLUCOSE 108* 198*  Elevated LFTs  CBGs: 95-213  x24 hours   NUTRITION - FOCUSED PHYSICAL EXAM: Unable to perform at this time; will attempt at follow-up  Diet Order:   Diet Order             Diet renal/carb modified with fluid restriction Fluid restriction: 1200 mL Fluid; Room service appropriate? Yes; Fluid consistency: Thin  Diet effective now                   EDUCATION NEEDS:   No education needs have been identified at this time  Skin:  Skin Assessment: Skin Integrity Issues: Skin Integrity Issues:: Incisions Incisions: L foot  Last BM:  PTA  Height:   Ht Readings from Last 1 Encounters:  10/10/21 6' (1.829 m)    Weight:   Wt Readings from Last 3 Encounters:  10/10/21 93 kg  08/10/20 94.3 kg  12/08/19 95 kg    BMI:  Body mass index is 27.8 kg/m.  Estimated Nutritional Needs:  Kcal:  2300-2500 Protein:  115-130 grams Fluid:  >2L     Theone Stanley., MS, RD, LDN (she/her/hers) RD pager number and weekend/on-call pager number located in [place].

## 2021-10-11 NOTE — Progress Notes (Signed)
PT Cancellation Note  Patient Details Name: Lazar Tierce MRN: [id number] DOB: [birth year]   Cancelled Treatment:    Reason Eval/Treat Not Completed: Other (comment)  Orders received and chart reviewed;  Discussed pt with Dr. March Rummage via secure chat; Pt is allowed to bear weight on L foot as tolerated in a Cam boot;  Noted boot has been delivered to the room;   Plan to return for PT evaluation after tomorrow's surgery;   Roney Marion, [place]  Acute Rehabilitation Services Pager [phone] Office [phone]    Colletta [place] 10/11/2021, 1:09 PM

## 2021-10-11 NOTE — Progress Notes (Signed)
Orthopedic Tech Progress Note Patient Details:  [name] [birth year] [id number]  Ortho Devices Type of Ortho Device: CAM walker Ortho Device/Splint Location: LLE Ortho Device/Splint Interventions: Ordered   Post Interventions Instructions Provided: Adjustment of device, Care of device Pt did not want to wear in bed, provided instructions on how to apply device.  Vernona Rieger 10/11/2021, 12:05 PM

## 2021-10-11 NOTE — Progress Notes (Signed)
PROGRESS NOTE  [name] CWC:[id number] DOB: Oct 05, 1950 DOA: 10/10/2021 PCP: [facility]  HPI/Recap of past 24 hours: [name] is a 71 y.o. male with medical history significant of DM2, CKD4, HTN.  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  Who presented to [facility] ED from home with ongoing ulceration of ball area of L foot and worsening renal function.  Followed by podiatry, with concern for osteomyelitis.  Admitted for left foot gas gangrene and AKI on CKD 4.  Post I&D on 10/10/2021 by podiatry Dr March Rummage.  Patient also seen by nephrology, Dr. Hollie Salk.  Appreciate specialists' assistance.  10/11/2021: Patient was seen and examined at his bedside.  He has no new complaints.  Denies any pain after taking his pain medication.  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  Appreciate podiatry's assistance Continue IV antibiotics empirically, IV Zosyn and IV linezolid Continue to follow blood cultures, negative to date Continue pain control, added bowel regimen Rest of management per podiatry.  AKI on CKD 4, the setting of sepsis and recent Bactrim administration, improving Baseline creatinine appears to be 4 Presented with creatinine greater than 5 Creatinine downtrending 4.7 Closely monitor urine output Continue to avoid nephrotoxic agents and hypotension. Continue to hold off torsemide/ARB/SGLT2 inhibitors as recommended by nephrology IV contrast unless absolutely necessary Continue strict I's and O's and daily weight. Repeat  renal panel in the morning.  Non anion gap metabolic acidosis in the setting of AKI Serum bicarb is improving 20 from 18, anion gap of 8. He is currently on gentle IV fluid hydration LR 50 cc/h x 2 days Continue to monitor and repeat renal function in the morning.  Elevated liver chemistries, unclear etiology Continue to monitor closely Avoid hepatotoxic agents. Trend LFTs  Hypertension BP is currently at goal Currently not on antihypertensives to avoid hypotension No ARB or diuretics at this time. Norvasc okay if needed Continue to closely monitor vital signs.  Type 2 diabetes with hyperglycemia Hemoglobin A1c 8.3 on 10/10/2021. Continue insulin sliding scale Avoid hypoglycemia.  Anemia of chronic disease Drop in hemoglobin 8.4 from 9.8 Continue to closely monitor H&H.       Code Status: Full code  Family Communication: Full code none at bedside  Disposition Plan: Likely will discharge to home once podiatry and nephrology sign off.   Consultants: Nephrology Podiatry  Procedures: Post I&D on 10/10/2021.  Antimicrobials: IV Zosyn IV linezolid  DVT prophylaxis: Subcu heparin 3 times daily  Status is: Inpatient  Patient requires at least 2 midnights for further evaluation and treatment of present condition.      Objective: Vitals:   10/10/21 2335 10/10/21 2357 10/11/21 0817 10/11/21 1333  BP: (!) 147/67 (!) 153/72 (!) 126/59 135/66  Pulse: 95 99 94 84  Resp: 20 18 19 17   Temp:  98.1 F (36.7 C) (!) 100.4 F (38 C) 98.5 F (36.9 C)  TempSrc:  Oral Oral   SpO2: 98% 100% 98% 100%  Weight:  Height:        Intake/Output Summary (Last 24 hours) at 10/11/2021 1402 Last data filed at 10/10/2021 2205 Gross per 24 hour  Intake 550.17 ml  Output --  Net 550.17 ml   Filed Weights   10/10/21 0026  Weight: 93 kg    Exam:  General: 72 y.o. year-old male well developed well nourished in no acute distress.  Alert and oriented x3. Cardiovascular:  Regular rate and rhythm with no rubs or gallops.  No thyromegaly or JVD noted.   Respiratory: Clear to auscultation with no wheezes or rales. Good inspiratory effort. Abdomen: Soft nontender nondistended with normal bowel sounds x4 quadrants. Musculoskeletal: Trace lower extremity edema bilaterally.   Skin: Left foot in surgical dressing. Psychiatry: Mood is appropriate for condition and setting   Data Reviewed: CBC: Recent Labs  Lab 10/10/21 0028 10/11/21 0340  WBC 24.7* 23.3*  NEUTROABS 20.0*  --   HGB 9.8* 8.4*  HCT 29.7* 26.2*  MCV 87.9 87.0  PLT 309 160   Basic Metabolic Panel: Recent Labs  Lab 10/10/21 0028 10/11/21 0340  NA 134* 132*  K 4.2 4.9  CL 106 104  CO2 18* 20*  GLUCOSE 108* 198*  BUN 44* 39*  CREATININE 5.25* 4.73*  CALCIUM 7.6* 7.4*   GFR: Estimated Creatinine Clearance: 16.4 mL/min (A) (by C-G formula based on SCr of 4.73 mg/dL (H)). Liver Function Tests: Recent Labs  Lab 10/10/21 0028  AST 51*  ALT 45*  ALKPHOS 131*  BILITOT 0.3  PROT 8.0  ALBUMIN 2.1*   No results for input(s): LIPASE, AMYLASE in the last 168 hours. No results for input(s): AMMONIA in the last 168 hours. Coagulation Profile: No results for input(s): INR, PROTIME in the last 168 hours. Cardiac Enzymes: No results for input(s): CKTOTAL, CKMB, CKMBINDEX, TROPONINI in the last 168 hours. BNP (last 3 results) No results for input(s): PROBNP in the last 8760 hours. HbA1C: Recent Labs    10/10/21 0250  HGBA1C 8.3*   CBG: Recent Labs  Lab 10/10/21 2207 10/11/21 0006 10/11/21 0538 10/11/21 0628 10/11/21 1119  GLUCAP 166* 169* 213* 206* 107*   Lipid Profile: No results for input(s): CHOL, HDL, LDLCALC, TRIG, CHOLHDL, LDLDIRECT in the last 72 hours. Thyroid Function Tests: No results for input(s): TSH, T4TOTAL, FREET4, T3FREE, THYROIDAB in the last 72 hours. Anemia Panel: No results for input(s): VITAMINB12, FOLATE, FERRITIN, TIBC, IRON, RETICCTPCT in the last 72  hours. Urine analysis:    Component Value Date/Time   COLORURINE YELLOW 10/10/2021 2033   APPEARANCEUR CLEAR 10/10/2021 2033   LABSPEC 1.015 10/10/2021 2033   PHURINE 6.0 10/10/2021 2033   GLUCOSEU NEGATIVE 10/10/2021 2033   HGBUR MODERATE (A) 10/10/2021 2033   BILIRUBINUR NEGATIVE 10/10/2021 2033   [place] NEGATIVE 10/10/2021 2033   PROTEINUR 100 (A) 10/10/2021 2033   NITRITE NEGATIVE 10/10/2021 2033   LEUKOCYTESUR NEGATIVE 10/10/2021 2033   Sepsis Labs: @LABRCNTIP (procalcitonin:4,lacticidven:4)  ) Recent Results (from the past 240 hour(s))  WOUND CULTURE     Status: Abnormal   Collection Time: 10/06/21  5:02 PM   Specimen: Wound  Result Value Ref Range Status   MICRO NUMBER: [id number]  Final   SPECIMEN QUALITY: Adequate  Final   SOURCE: WOUND (SITE NOT SPECIFIED)  Final   STATUS: FINAL  Final   GRAM STAIN:   Final    Few Polymorphonuclear leukocytes No epithelial cells seen Few Gram positive cocci in pairs Few Gram negative bacilli   ISOLATE 1: Citrobacter koseri (A)  Final    Comment: Heavy growth of Citrobacter koseri      Susceptibility   Citrobacter koseri - AEROBIC CULT, GRAM STAIN NEGATIVE 1    AMOX/CLAVULANIC <=2 Sensitive     CEFAZOLIN* <=4 Not Reportable      * For infections other than uncomplicated UTI caused by E. coli, K. pneumoniae or P. mirabilis: Cefazolin is resistant if MIC > or = 8 mcg/mL. (Distinguishing susceptible versus intermediate for isolates with MIC < or = 4 mcg/mL requires additional testing.)     CEFTAZIDIME <=1 Sensitive     CEFEPIME <=1 Sensitive     CEFTRIAXONE <=1 Sensitive     CIPROFLOXACIN <=0.25 Sensitive     LEVOFLOXACIN <=0.12 Sensitive     GENTAMICIN <=1 Sensitive     IMIPENEM 1 Sensitive     PIP/TAZO <=4 Sensitive     TOBRAMYCIN <=1 Sensitive     TRIMETH/SULFA* <=20 Sensitive      * For infections other than uncomplicated UTI caused by E. coli, K. pneumoniae or P. mirabilis: Cefazolin is resistant if MIC > or = 8  mcg/mL. (Distinguishing susceptible versus intermediate for isolates with MIC < or = 4 mcg/mL requires additional testing.) Legend: S = Susceptible  I = Intermediate R = Resistant  NS = Not susceptible * = Not tested  NR = Not reported **NN = See antimicrobic comments   Resp Panel by RT-PCR (Flu A&B, Covid) Nasopharyngeal Swab     Status: None   Collection Time: 10/10/21 12:28 AM   Specimen: Nasopharyngeal Swab; Nasopharyngeal(NP) swabs in vial transport medium  Result Value Ref Range Status   SARS Coronavirus 2 by RT PCR NEGATIVE NEGATIVE Final    Comment: (NOTE) SARS-CoV-2 target nucleic acids are NOT DETECTED.  The SARS-CoV-2 RNA is generally detectable in upper respiratory specimens during the acute phase of infection. The lowest concentration of SARS-CoV-2 viral copies this assay can detect is 138 copies/mL. A negative result does not preclude SARS-Cov-2 infection and should not be used as the sole basis for treatment or other patient management decisions. A negative result may occur with  improper specimen collection/handling, submission of specimen other than nasopharyngeal swab, presence of viral mutation(s) within the areas targeted by this assay, and inadequate number of viral copies(<138 copies/mL). A negative result must be combined with clinical observations, patient history, and epidemiological information. The expected result is Negative.  Fact Sheet for Patients:  EntrepreneurPulse.com.au  Fact Sheet for Healthcare Providers:  IncredibleEmployment.be  This test is no t yet approved or cleared by the [place] FDA and  has been authorized for detection and/or diagnosis of SARS-CoV-2 by FDA under an Emergency Use Authorization (EUA). This EUA will remain  in effect (meaning this test can be used) for the duration of the COVID-19 declaration under Section 564(b)(1) of the Act, 21 U.S.C.section 360bbb-3(b)(1), unless the  authorization is terminated  or revoked sooner.       Influenza A by PCR NEGATIVE NEGATIVE Final   Influenza B by PCR NEGATIVE NEGATIVE Final    Comment: (NOTE) The Xpert Xpress SARS-CoV-2/FLU/RSV plus assay is intended as an aid in the diagnosis of influenza from Nasopharyngeal swab specimens and should not be used as a sole basis for treatment. Nasal washings and aspirates are unacceptable for Xpert Xpress SARS-CoV-2/FLU/RSV testing.  Fact Sheet for Patients: EntrepreneurPulse.com.au  Fact Sheet for Healthcare Providers: IncredibleEmployment.be  This test is not yet approved or cleared by the [place] FDA and has been authorized for detection and/or  diagnosis of SARS-CoV-2 by FDA under an Emergency Use Authorization (EUA). This EUA will remain in effect (meaning this test can be used) for the duration of the COVID-19 declaration under Section 564(b)(1) of the Act, 21 U.S.C. section 360bbb-3(b)(1), unless the authorization is terminated or revoked.  Performed at [facility], [place] [address] [place], [place] [address]   Blood culture (routine x 2)     Status: None (Preliminary result)   Collection Time: 10/10/21  1:00 AM   Specimen: BLOOD  Result Value Ref Range Status   Specimen Description BLOOD LEFT ARM  Final   Special Requests   Final    BOTTLES DRAWN AEROBIC AND ANAEROBIC Blood Culture adequate volume   Culture   Final    NO GROWTH 1 DAY Performed at [facility], [place] [address] [place], [place] [address]    Report Status PENDING  Incomplete  Blood culture (routine x 2)     Status: None (Preliminary result)   Collection Time: 10/10/21  1:05 AM   Specimen: BLOOD  Result Value Ref Range Status   Specimen Description BLOOD RIGHT ARM  Final   Special Requests   Final    BOTTLES DRAWN AEROBIC AND ANAEROBIC Blood Culture results may not be optimal due to an inadequate volume of blood received in culture bottles    Culture   Final    NO GROWTH 1 DAY Performed at [facility], [place] [address], [place], [place] [address]    Report Status PENDING  Incomplete  Aerobic Culture w Gram Stain (superficial specimen)     Status: None (Preliminary result)   Collection Time: 10/10/21  6:24 AM   Specimen: Wound  Result Value Ref Range Status   Specimen Description WOUND  Final   Special Requests SINOSP  Final   Gram Stain   Final    ABUNDANT WBC PRESENT, PREDOMINANTLY PMN MODERATE GRAM POSITIVE COCCI    Culture   Final    CULTURE REINCUBATED FOR BETTER GROWTH Performed at [facility], [place] [address] [place], [place] [address]    Report Status PENDING  Incomplete  Aerobic/Anaerobic Culture w Gram Stain (surgical/deep wound)     Status: None (Preliminary result)   Collection Time: 10/10/21  9:49 PM   Specimen: Abscess; Wound  Result Value Ref Range Status   Specimen Description ABSCESS LEFT FOOT  Final   Special Requests NONE  Final   Gram Stain   Final    FEW WBC PRESENT, PREDOMINANTLY PMN FEW GRAM POSITIVE COCCI IN CHAINS    Culture   Final    TOO YOUNG TO READ Performed at [facility], [place] [address], [place], [place] [address]    Report Status PENDING  Incomplete  MRSA Next Gen by PCR, Nasal     Status: None   Collection Time: 10/11/21  5:54 AM   Specimen: Nasal Mucosa; Nasal Swab  Result Value Ref Range Status   MRSA by PCR Next Gen NOT DETECTED NOT DETECTED Final    Comment: (NOTE) The GeneXpert MRSA Assay (FDA approved for NASAL specimens only), is one component of a comprehensive MRSA colonization surveillance program. It is not intended to diagnose MRSA infection nor to guide or monitor treatment for MRSA infections. Test performance is not FDA approved in patients less than 25 years old. Performed at [facility], [place] [address], [place], [place] [address]       Studies: US RENAL  Result  Date: 10/10/2021 CLINICAL DATA:  Acute kidney injury EXAM: RENAL /  URINARY TRACT ULTRASOUND COMPLETE COMPARISON:  11/20/2018 FINDINGS: Right Kidney: Renal measurements: 9.5 x 4.9 x 5.3 cm = volume: 129.3 mL. Cortex is echogenic. Trace perinephric fluid. No hydronephrosis. Upper pole cyst measuring 3.7 cm. Midpole parapelvic cyst measuring 14 mm. Left Kidney: Renal measurements: 9 x 5.1 x 4.8 cm = volume: 110.3 mL. Cortex is echogenic. No hydronephrosis. Small cyst lower pole measuring 15 mm. Bladder: Appears normal for degree of bladder distention. Other: None. IMPRESSION: 1. Echogenic kidneys consistent with medical renal disease. No hydronephrosis 2. Simple appearing renal cysts. Electronically Signed   By: Donavan Foil M.D.   On: 10/10/2021 15:43   MR FOOT LEFT WO CONTRAST  Result Date: 10/11/2021 CLINICAL DATA:  Chronic left foot ulcer.  Foot pain and swelling. EXAM: MRI OF THE LEFT FOOT WITHOUT CONTRAST TECHNIQUE: Multiplanar, multisequence MR imaging of the left foot was performed. No intravenous contrast was administered. 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 Soft tissue wound along the distal plantar medial aspect of the first TMT joint with a sinus tract extending into the soft tissues with focal area of inflammatory soft tissue along the plantar aspect of the foot concerning for a phlegmon. Heterogeneous low signal material within the area of the phlegmon likely reflecting packing material. Severe bone marrow edema in the first metatarsal head and sesamoids concerning for osteomyelitis. No other marrow signal abnormality. Normal alignment. No joint effusion. No marrow signal abnormality. Ligaments Collateral ligaments are intact.  Lisfranc ligament is intact. Muscles and Tendons Flexor, peroneal and extensor compartment tendons are intact. Muscles are normal. Soft tissue No fluid collection or hematoma.  No soft tissue mass. IMPRESSION: 1. Prior amputation of the first phalanx and second  phalanx. Soft tissue wound along the distal plantar medial aspect of the first TMT joint with a sinus tract extending into the soft tissues with focal area of inflammatory soft tissue along the plantar aspect of the foot concerning for a phlegmon. Heterogeneous low signal material within the area of the phlegmon likely reflecting packing material. 2. Severe bone marrow edema in the first metatarsal head and sesamoids concerning for osteomyelitis. Electronically Signed   By: Kathreen Devoid M.D.   On: 10/11/2021 10:49    Scheduled Meds:  (feeding supplement) PROSource Plus  30 mL Oral BID BM   atorvastatin  80 mg Oral Daily   docusate sodium  200 mg Oral Daily   insulin aspart  0-6 Units Subcutaneous Q4H   multivitamin with minerals  1 tablet Oral Daily   pregabalin  75 mg Oral BID   Ensure Max Protein  11 oz Oral Daily   sodium hypochlorite   Irrigation Daily    Continuous Infusions:  lactated ringers 50 mL/hr at 10/11/21 1109   linezolid (ZYVOX) IV 600 mg (10/11/21 0239)   piperacillin-tazobactam (ZOSYN)  IV 2.25 g (10/11/21 0841)     LOS: 1 day     Kayleen Memos, MD [facility] Pager [phone]  If 7PM-7AM, please contact night-coverage [email or web address] Password [email or web address] 10/11/2021, 2:02 PM

## 2021-10-11 NOTE — Progress Notes (Signed)
[facility] Progress Note    Assessment/ Plan:     AKI on CKD IV: last Cr 08/2020 was 4.0, per pt last eGFR 17% now up to 5.25 (EGFR 11%) in setting of sepsis and recent Bactrim administration. UA relatively bland.   - check renal US- no obstruction - not uremic, no need for HD--> fortunately Cr is improving 4.7 today 10/11/21 - Bactrim contraindicated in advanced CKD - hold torsemide/ ARB/ SGLT2i etc and all other nephrotoxic agents - no IV contrast unless absolutely necessary - daily weights, strict I/O - expect further improvement with treatment of sepsis - avoid hypotensive episodes- will prolong recovery     2.  Osteo of L foot with sepsis- on linezolid/ Zosyn, wound cultures and blood cultures obtained.  ABIs normal.  went to OR with podiatry yesterday for debridement, back to OR, likely tomorrow 1/11   3. HTN: avoid ARB and diuretics, amlodipine (on home list) would be OK if needed   4.  DM II: per primary   5.  Dispo: admitted  Subjective:    Feeling better, L foot in a boot.  Went to OR for debridement yesterday, back to OR tomorrow planned   Objective:   BP 135/66 (BP Location: Right Arm)    Pulse 84    Temp 98.5 F (36.9 C)    Resp 17    Ht 6' (1.829 m)    Wt 93 kg    SpO2 100%    BMI 27.80 kg/m   Intake/Output Summary (Last 24 hours) at 10/11/2021 1343 Last data filed at 10/10/2021 2205 Gross per 24 hour  Intake 550.17 ml  Output --  Net 550.17 ml   Weight change:   Physical Exam: GEN: NAD, sitting in bed HEENT EOMI PERRL NECK no JVD PULM clear CV RRR ABD soft EXT L foot dressed, no edema bilaterally NEURO AAO x 3   Imaging: US RENAL  Result Date: 10/10/2021 CLINICAL DATA:  Acute kidney injury EXAM: RENAL / URINARY TRACT ULTRASOUND COMPLETE COMPARISON:  11/20/2018 FINDINGS: Right Kidney: Renal measurements: 9.5 x 4.9 x 5.3 cm = volume: 129.3 mL. Cortex is echogenic. Trace perinephric fluid. No hydronephrosis. Upper pole cyst measuring 3.7  cm. Midpole parapelvic cyst measuring 14 mm. Left Kidney: Renal measurements: 9 x 5.1 x 4.8 cm = volume: 110.3 mL. Cortex is echogenic. No hydronephrosis. Small cyst lower pole measuring 15 mm. Bladder: Appears normal for degree of bladder distention. Other: None. IMPRESSION: 1. Echogenic kidneys consistent with medical renal disease. No hydronephrosis 2. Simple appearing renal cysts. Electronically Signed   By: Donavan Foil M.D.   On: 10/10/2021 15:43   MR FOOT LEFT WO CONTRAST  Result Date: 10/11/2021 CLINICAL DATA:  Chronic left foot ulcer.  Foot pain and swelling. EXAM: MRI OF THE LEFT FOOT WITHOUT CONTRAST TECHNIQUE: Multiplanar, multisequence MR imaging of the left foot was performed. No intravenous contrast was administered. 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 Soft tissue wound along the distal plantar medial aspect of the first TMT joint with a sinus tract extending into the soft tissues with focal area of inflammatory soft tissue along the plantar aspect of the foot concerning for a phlegmon. Heterogeneous low signal material within the area of the phlegmon likely reflecting packing material. Severe bone marrow edema in the first metatarsal head and sesamoids concerning for osteomyelitis. No other marrow signal abnormality. Normal alignment. No joint effusion. No marrow signal abnormality. Ligaments Collateral ligaments are  intact.  Lisfranc ligament is intact. Muscles and Tendons Flexor, peroneal and extensor compartment tendons are intact. Muscles are normal. Soft tissue No fluid collection or hematoma.  No soft tissue mass. IMPRESSION: 1. 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 Soft tissue wound along the distal plantar medial aspect of the first TMT joint with a sinus tract extending into the soft tissues with focal area of inflammatory soft tissue along the plantar aspect of the foot concerning for a  phlegmon. Heterogeneous low signal material within the area of the phlegmon likely reflecting packing material. 2. Severe bone marrow edema in the first metatarsal head and sesamoids concerning for osteomyelitis. Electronically Signed   By: Kathreen Devoid M.D.   On: 10/11/2021 10:49   DG Foot Complete Left  Result Date: 10/10/2021 CLINICAL DATA:  Infection, concern for osteomyelitis. EXAM: LEFT FOOT - COMPLETE 3+ VIEW COMPARISON:  10/06/2021. FINDINGS: There has been amputation of the first and second digits and heads of the second and third metatarsals. Soft tissue swelling is seen predominantly in the medial aspect of the midfoot and plantar aspect of the hindfoot. There is deformity of the skin along the base of the forefoot, possible ulceration. No acute fracture or dislocation. Bony erosions are present along the inferior aspect of the presumed first metatarsal on the lateral view. Mild calcaneal spurring is noted. Vascular calcifications are present in the soft tissues. IMPRESSION: 1. Soft tissue swelling with subcutaneous air in the medial and plantar aspect of the mid and hindfoot, compatible with history of infection. 2. Suspected ulceration along the plantar aspect of the forefoot. There are bony erosions along the plantar aspect of the presumed first metatarsal on the lateral view, concerning for osteomyelitis. MRI is suggested for further evaluation. Electronically Signed   By: Brett Fairy M.D.   On: 10/10/2021 01:29   VAS [place] ABI WITH/WO TBI  Result Date: 10/10/2021  LOWER EXTREMITY DOPPLER STUDY Patient Name:  [name]  Date of Exam:   10/10/2021 Medical Rec #: [id number]      Accession #:    [id number] Date of Birth: [birth year]      Patient Gender: M Patient Age:   69 years Exam Location:  [facility] Procedure:      VAS [place] ABI WITH/WO TBI Referring Phys: JARED GARDNER --------------------------------------------------------------------------------  Indications: Ulceration left foot.  High Risk Factors: Hypertension, hyperlipidemia, Diabetes, prior CVA.  Vascular Interventions: Lt great toe amputation. Comparison Study: no prior Performing Technologist: Archie Patten RVS  Examination Guidelines: A complete evaluation includes at minimum, Doppler waveform signals and systolic blood pressure reading at the level of bilateral brachial, anterior tibial, and posterior tibial arteries, when vessel segments are accessible. Bilateral testing is considered an integral part of a complete examination. Photoelectric Plethysmograph (PPG) waveforms and toe systolic pressure readings are included as required and additional duplex testing as needed. Limited examinations for reoccurring indications may be performed as noted.  ABI Findings: +--------+------------------+-----+---------+--------+  Right    Rt Pressure (mmHg) Index Waveform  Comment   +--------+------------------+-----+---------+--------+  Brachial 177                      triphasic           +--------+------------------+-----+---------+--------+  PTA      182                1.03  triphasic           +--------+------------------+-----+---------+--------+  DP  152                0.86  triphasic           +--------+------------------+-----+---------+--------+ +---------+------------------+-----+---------+--------------------+  Left      Lt Pressure (mmHg) Index Waveform  Comment               +---------+------------------+-----+---------+--------------------+  Brachial  167                      triphasic                       +---------+------------------+-----+---------+--------------------+  PTA       179                1.01  biphasic                        +---------+------------------+-----+---------+--------------------+  DP        145                0.82  biphasic                        +---------+------------------+-----+---------+--------------------+  Great Toe                                    great toe amputation   +---------+------------------+-----+---------+--------------------+ +-------+-----------+-----------+------------+------------+  ABI/TBI Today's ABI Today's TBI Previous ABI Previous TBI  +-------+-----------+-----------+------------+------------+  Right   1.03                                               +-------+-----------+-----------+------------+------------+  Left    1.01                                               +-------+-----------+-----------+------------+------------+  Summary: Right: Resting right ankle-brachial index is within normal range. No evidence of significant right lower extremity arterial disease. Left: Resting left ankle-brachial index is within normal range. No evidence of significant left lower extremity arterial disease.  *See table(s) above for measurements and observations.  Electronically signed by Servando Snare MD on 10/10/2021 at 5:55:43 PM.    Final     Labs: BMET Recent Labs  Lab 10/10/21 0028 10/11/21 0340  NA 134* 132*  K 4.2 4.9  CL 106 104  CO2 18* 20*  GLUCOSE 108* 198*  BUN 44* 39*  CREATININE 5.25* 4.73*  CALCIUM 7.6* 7.4*   CBC Recent Labs  Lab 10/10/21 0028 10/11/21 0340  WBC 24.7* 23.3*  NEUTROABS 20.0*  --   HGB 9.8* 8.4*  HCT 29.7* 26.2*  MCV 87.9 87.0  PLT 309 291    Medications:     (feeding supplement) PROSource Plus  30 mL Oral BID BM   atorvastatin  80 mg Oral Daily   docusate sodium  200 mg Oral Daily   insulin aspart  0-6 Units Subcutaneous Q4H   multivitamin with minerals  1 tablet Oral Daily   pregabalin  75 mg Oral BID   Ensure Max Protein  11 oz Oral Daily   sodium hypochlorite   Irrigation Daily  Madelon Lips, MD 10/11/2021, 1:43 PM

## 2021-10-11 NOTE — H&P (View-Only) (Signed)
Subjective:  Patient ID: [name], male    DOB: 01-Nov-1950,  MRN: [id number]  Patient seen bedside. Reports pain to the foot but denies other complaints.  Patient's wife at bedside. Objective:   Vitals:   10/10/21 2357 10/11/21 0817  BP: (!) 153/72 (!) 126/59  Pulse: 99 94  Resp: 18 19  Temp: 98.1 F (36.7 C) (!) 100.4 F (38 C)  SpO2: 100% 98%   General AA&O x3. Normal mood and affect.  Vascular Dorsalis pedis and posterior tibial pulses palpable. Foot WWP.   Neurologic Epicritic sensation grossly intact.  Dermatologic Wounds left foot without purulence upon removal of packing. Sanguineous drainage only. Resolving warmth and erythema. Medial and distal wounds well appearing without visible necrosis. Heel wound with small amount of necrotic tissue in wound bed. Mild malodor at this area.  Orthopedic: +motor to foot. Multiple digital amputations left foot.   Results for orders placed or performed during the hospital encounter of 10/10/21  Resp Panel by RT-PCR (Flu A&B, Covid) Nasopharyngeal Swab     Status: None   Collection Time: 10/10/21 12:28 AM   Specimen: Nasopharyngeal Swab; Nasopharyngeal(NP) swabs in vial transport medium  Result Value Ref Range Status   SARS Coronavirus 2 by RT PCR NEGATIVE NEGATIVE Final    Comment: (NOTE) SARS-CoV-2 target nucleic acids are NOT DETECTED.  The SARS-CoV-2 RNA is generally detectable in upper respiratory specimens during the acute phase of infection. The lowest concentration of SARS-CoV-2 viral copies this assay can detect is 138 copies/mL. A negative result does not preclude SARS-Cov-2 infection and should not be used as the sole basis for treatment or other patient management decisions. A negative result may occur with  improper specimen collection/handling, submission of specimen other than nasopharyngeal swab, presence of viral mutation(s) within the areas targeted by this assay, and inadequate number of viral copies(<138  copies/mL). A negative result must be combined with clinical observations, patient history, and epidemiological information. The expected result is Negative.  Fact Sheet for Patients:  EntrepreneurPulse.com.au  Fact Sheet for Healthcare Providers:  IncredibleEmployment.be  This test is no t yet approved or cleared by the [place] FDA and  has been authorized for detection and/or diagnosis of SARS-CoV-2 by FDA under an Emergency Use Authorization (EUA). This EUA will remain  in effect (meaning this test can be used) for the duration of the COVID-19 declaration under Section 564(b)(1) of the Act, 21 U.S.C.section 360bbb-3(b)(1), unless the authorization is terminated  or revoked sooner.       Influenza A by PCR NEGATIVE NEGATIVE Final   Influenza B by PCR NEGATIVE NEGATIVE Final    Comment: (NOTE) The Xpert Xpress SARS-CoV-2/FLU/RSV plus assay is intended as an aid in the diagnosis of influenza from Nasopharyngeal swab specimens and should not be used as a sole basis for treatment. Nasal washings and aspirates are unacceptable for Xpert Xpress SARS-CoV-2/FLU/RSV testing.  Fact Sheet for Patients: EntrepreneurPulse.com.au  Fact Sheet for Healthcare Providers: IncredibleEmployment.be  This test is not yet approved or cleared by the [place] FDA and has been authorized for detection and/or diagnosis of SARS-CoV-2 by FDA under an Emergency Use Authorization (EUA). This EUA will remain in effect (meaning this test can be used) for the duration of the COVID-19 declaration under Section 564(b)(1) of the Act, 21 U.S.C. section 360bbb-3(b)(1), unless the authorization is terminated or revoked.  Performed at [facility], [place] [address], [place], [place] [address]   Blood culture (routine x 2)  Status: None (Preliminary result)   Collection Time: 10/10/21  1:00 AM   Specimen: BLOOD  Result  Value Ref Range Status   Specimen Description BLOOD LEFT ARM  Final   Special Requests   Final    BOTTLES DRAWN AEROBIC AND ANAEROBIC Blood Culture adequate volume   Culture   Final    NO GROWTH 1 DAY Performed at [facility], [address] [place], [place] [address]    Report Status PENDING  Incomplete  Blood culture (routine x 2)     Status: None (Preliminary result)   Collection Time: 10/10/21  1:05 AM   Specimen: BLOOD  Result Value Ref Range Status   Specimen Description BLOOD RIGHT ARM  Final   Special Requests   Final    BOTTLES DRAWN AEROBIC AND ANAEROBIC Blood Culture results may not be optimal due to an inadequate volume of blood received in culture bottles   Culture   Final    NO GROWTH 1 DAY Performed at [facility], [place] [address] [place], [place] [address]    Report Status PENDING  Incomplete  Aerobic Culture w Gram Stain (superficial specimen)     Status: None (Preliminary result)   Collection Time: 10/10/21  6:24 AM   Specimen: Wound  Result Value Ref Range Status   Specimen Description WOUND  Final   Special Requests SINOSP  Final   Gram Stain   Final    ABUNDANT WBC PRESENT, PREDOMINANTLY PMN MODERATE GRAM POSITIVE COCCI    Culture   Final    CULTURE REINCUBATED FOR BETTER GROWTH Performed at [facility], [place] [address] [place], [place] [address]    Report Status PENDING  Incomplete  Aerobic/Anaerobic Culture w Gram Stain (surgical/deep wound)     Status: None (Preliminary result)   Collection Time: 10/10/21  9:49 PM   Specimen: Abscess; Wound  Result Value Ref Range Status   Specimen Description ABSCESS LEFT FOOT  Final   Special Requests NONE  Final   Gram Stain   Final    FEW WBC PRESENT, PREDOMINANTLY PMN FEW GRAM POSITIVE COCCI IN CHAINS    Culture   Final    TOO YOUNG TO READ Performed at [facility], [place] [address] [place], [place] [address]    Report Status PENDING  Incomplete  MRSA Next Gen by PCR, Nasal      Status: None   Collection Time: 10/11/21  5:54 AM   Specimen: Nasal Mucosa; Nasal Swab  Result Value Ref Range Status   MRSA by PCR Next Gen NOT DETECTED NOT DETECTED Final    Comment: (NOTE) The GeneXpert MRSA Assay (FDA approved for NASAL specimens only), is one component of a comprehensive MRSA colonization surveillance program. It is not intended to diagnose MRSA infection nor to guide or monitor treatment for MRSA infections. Test performance is not FDA approved in patients less than 66 years old. Performed at [facility], [place] [address] [place],  [address]     Assessment & Plan:  Patient was evaluated and treated and all questions answered.  Left foot Osteomyelitis, gas gangrene s/p I and D -Dressing changed. Irrigated with Dakin's. Dakins WTD applied to heel, medial and distal wounds packed with iodoform. Dressed with DSD. -Discussed planned RTOR tomorrow for debridement and 1st metatarsal resection. Discussed procedure in detail with patient and wife. All questions answered -Continue empiric abx. -Micro pending - GPC on GS. -Labs reviewed, elevated but slightly  decreased WBC compared to yesterday. Will trend. -NPO at midnight tonight for planned surgery tomorrow. -Will continue to follow.  Evelina Bucy, DPM  Accessible via secure chat for questions or concerns.

## 2021-10-11 NOTE — Progress Notes (Signed)
Subjective:  Patient ID: [name], male    DOB: 1950-10-08,  MRN: [id number]  Patient seen bedside. Reports pain to the foot but denies other complaints.  Patient's wife at bedside. Objective:   Vitals:   10/10/21 2357 10/11/21 0817  BP: (!) 153/72 (!) 126/59  Pulse: 99 94  Resp: 18 19  Temp: 98.1 F (36.7 C) (!) 100.4 F (38 C)  SpO2: 100% 98%   General AA&O x3. Normal mood and affect.  Vascular Dorsalis pedis and posterior tibial pulses palpable. Foot WWP.   Neurologic Epicritic sensation grossly intact.  Dermatologic Wounds left foot without purulence upon removal of packing. Sanguineous drainage only. Resolving warmth and erythema. Medial and distal wounds well appearing without visible necrosis. Heel wound with small amount of necrotic tissue in wound bed. Mild malodor at this area.  Orthopedic: +motor to foot. Multiple digital amputations left foot.   Results for orders placed or performed during the hospital encounter of 10/10/21  Resp Panel by RT-PCR (Flu A&B, Covid) Nasopharyngeal Swab     Status: None   Collection Time: 10/10/21 12:28 AM   Specimen: Nasopharyngeal Swab; Nasopharyngeal(NP) swabs in vial transport medium  Result Value Ref Range Status   SARS Coronavirus 2 by RT PCR NEGATIVE NEGATIVE Final    Comment: (NOTE) SARS-CoV-2 target nucleic acids are NOT DETECTED.  The SARS-CoV-2 RNA is generally detectable in upper respiratory specimens during the acute phase of infection. The lowest concentration of SARS-CoV-2 viral copies this assay can detect is 138 copies/mL. A negative result does not preclude SARS-Cov-2 infection and should not be used as the sole basis for treatment or other patient management decisions. A negative result may occur with  improper specimen collection/handling, submission of specimen other than nasopharyngeal swab, presence of viral mutation(s) within the areas targeted by this assay, and inadequate number of viral copies(<138  copies/mL). A negative result must be combined with clinical observations, patient history, and epidemiological information. The expected result is Negative.  Fact Sheet for Patients:  EntrepreneurPulse.com.au  Fact Sheet for Healthcare Providers:  IncredibleEmployment.be  This test is no t yet approved or cleared by the [place] FDA and  has been authorized for detection and/or diagnosis of SARS-CoV-2 by FDA under an Emergency Use Authorization (EUA). This EUA will remain  in effect (meaning this test can be used) for the duration of the COVID-19 declaration under Section 564(b)(1) of the Act, 21 U.S.C.section 360bbb-3(b)(1), unless the authorization is terminated  or revoked sooner.       Influenza A by PCR NEGATIVE NEGATIVE Final   Influenza B by PCR NEGATIVE NEGATIVE Final    Comment: (NOTE) The Xpert Xpress SARS-CoV-2/FLU/RSV plus assay is intended as an aid in the diagnosis of influenza from Nasopharyngeal swab specimens and should not be used as a sole basis for treatment. Nasal washings and aspirates are unacceptable for Xpert Xpress SARS-CoV-2/FLU/RSV testing.  Fact Sheet for Patients: EntrepreneurPulse.com.au  Fact Sheet for Healthcare Providers: IncredibleEmployment.be  This test is not yet approved or cleared by the [place] FDA and has been authorized for detection and/or diagnosis of SARS-CoV-2 by FDA under an Emergency Use Authorization (EUA). This EUA will remain in effect (meaning this test can be used) for the duration of the COVID-19 declaration under Section 564(b)(1) of the Act, 21 U.S.C. section 360bbb-3(b)(1), unless the authorization is terminated or revoked.  Performed at [facility], [place] [address] [place], [place] [address]   Blood culture (routine x 2)  Status: None (Preliminary result)   Collection Time: 10/10/21  1:00 AM   Specimen: BLOOD  Result  Value Ref Range Status   Specimen Description BLOOD LEFT ARM  Final   Special Requests   Final    BOTTLES DRAWN AEROBIC AND ANAEROBIC Blood Culture adequate volume   Culture   Final    NO GROWTH 1 DAY Performed at [facility], [address] [place], [place] [address]    Report Status PENDING  Incomplete  Blood culture (routine x 2)     Status: None (Preliminary result)   Collection Time: 10/10/21  1:05 AM   Specimen: BLOOD  Result Value Ref Range Status   Specimen Description BLOOD RIGHT ARM  Final   Special Requests   Final    BOTTLES DRAWN AEROBIC AND ANAEROBIC Blood Culture results may not be optimal due to an inadequate volume of blood received in culture bottles   Culture   Final    NO GROWTH 1 DAY Performed at [facility], [name] [address] [place], [place] [address]    Report Status PENDING  Incomplete  Aerobic Culture w Gram Stain (superficial specimen)     Status: None (Preliminary result)   Collection Time: 10/10/21  6:24 AM   Specimen: Wound  Result Value Ref Range Status   Specimen Description WOUND  Final   Special Requests SINOSP  Final   Gram Stain   Final    ABUNDANT WBC PRESENT, PREDOMINANTLY PMN MODERATE GRAM POSITIVE COCCI    Culture   Final    CULTURE REINCUBATED FOR BETTER GROWTH Performed at [facility], [place] [address] [place], [place] [address]    Report Status PENDING  Incomplete  Aerobic/Anaerobic Culture w Gram Stain (surgical/deep wound)     Status: None (Preliminary result)   Collection Time: 10/10/21  9:49 PM   Specimen: Abscess; Wound  Result Value Ref Range Status   Specimen Description ABSCESS LEFT FOOT  Final   Special Requests NONE  Final   Gram Stain   Final    FEW WBC PRESENT, PREDOMINANTLY PMN FEW GRAM POSITIVE COCCI IN CHAINS    Culture   Final    TOO YOUNG TO READ Performed at [facility], [place] [address] [place], [place] [address]    Report Status PENDING  Incomplete  MRSA Next Gen by PCR, Nasal      Status: None   Collection Time: 10/11/21  5:54 AM   Specimen: Nasal Mucosa; Nasal Swab  Result Value Ref Range Status   MRSA by PCR Next Gen NOT DETECTED NOT DETECTED Final    Comment: (NOTE) The GeneXpert MRSA Assay (FDA approved for NASAL specimens only), is one component of a comprehensive MRSA colonization surveillance program. It is not intended to diagnose MRSA infection nor to guide or monitor treatment for MRSA infections. Test performance is not FDA approved in patients less than 67 years old. Performed at [facility], [place] [address] [place], [place] [address]     Assessment & Plan:  Patient was evaluated and treated and all questions answered.  Left foot Osteomyelitis, gas gangrene s/p I and D -Dressing changed. Irrigated with Dakin's. Dakins WTD applied to heel, medial and distal wounds packed with iodoform. Dressed with DSD. -Discussed planned RTOR tomorrow for debridement and 1st metatarsal resection. Discussed procedure in detail with patient and wife. All questions answered -Continue empiric abx. -Micro pending - GPC on GS. -Labs reviewed, elevated but slightly  decreased WBC compared to yesterday. Will trend. -NPO at midnight tonight for planned surgery tomorrow. -Will continue to follow.  Evelina Bucy, DPM  Accessible via secure chat for questions or concerns.

## 2021-10-12 ENCOUNTER — Telehealth: Payer: Self-pay | Admitting: Podiatry

## 2021-10-12 ENCOUNTER — Encounter (HOSPITAL_COMMUNITY): Payer: Self-pay | Admitting: Internal Medicine

## 2021-10-12 ENCOUNTER — Inpatient Hospital Stay (HOSPITAL_COMMUNITY): Payer: Medicare HMO | Admitting: Anesthesiology

## 2021-10-12 ENCOUNTER — Encounter (HOSPITAL_COMMUNITY)
Admission: EM | Disposition: A | Discharge status: Home Health | Payer: Self-pay | Source: Home / Self Care | Attending: Internal Medicine

## 2021-10-12 DIAGNOSIS — N189 Chronic kidney disease, unspecified: Secondary | ICD-10-CM | POA: Diagnosis not present

## 2021-10-12 DIAGNOSIS — N179 Acute kidney failure, unspecified: Secondary | ICD-10-CM | POA: Diagnosis not present

## 2021-10-12 HISTORY — PX: WOUND DEBRIDEMENT: SHX247

## 2021-10-12 LAB — AEROBIC CULTURE W GRAM STAIN (SUPERFICIAL SPECIMEN)

## 2021-10-12 LAB — CBC
HCT: 26.4 % — ABNORMAL LOW (ref 39.0–52.0)
Hemoglobin: 8.5 g/dL — ABNORMAL LOW (ref 13.0–17.0)
MCH: 28.3 pg (ref 26.0–34.0)
MCHC: 32.2 g/dL (ref 30.0–36.0)
MCV: 88 fL (ref 80.0–100.0)
Platelets: 307 10*3/uL (ref 150–400)
RBC: 3 MIL/uL — ABNORMAL LOW (ref 4.22–5.81)
RDW: 14.3 % (ref 11.5–15.5)
WBC: 23.3 10*3/uL — ABNORMAL HIGH (ref 4.0–10.5)
nRBC: 0 % (ref 0.0–0.2)

## 2021-10-12 LAB — RENAL FUNCTION PANEL
Albumin: 1.5 g/dL — ABNORMAL LOW (ref 3.5–5.0)
Anion gap: 9 (ref 5–15)
BUN: 42 mg/dL — ABNORMAL HIGH (ref 8–23)
CO2: 17 mmol/L — ABNORMAL LOW (ref 22–32)
Calcium: 7.3 mg/dL — ABNORMAL LOW (ref 8.9–10.3)
Chloride: 103 mmol/L (ref 98–111)
Creatinine, Ser: 4.61 mg/dL — ABNORMAL HIGH (ref 0.61–1.24)
GFR, Estimated: 13 mL/min — ABNORMAL LOW (ref >60)
Glucose, Bld: 145 mg/dL — ABNORMAL HIGH (ref 70–99)
Phosphorus: 3.5 mg/dL (ref 2.5–4.6)
Potassium: 4.8 mmol/L (ref 3.5–5.1)
Sodium: 129 mmol/L — ABNORMAL LOW (ref 135–145)

## 2021-10-12 LAB — GLUCOSE, CAPILLARY
Glucose-Capillary: 123 mg/dL — ABNORMAL HIGH (ref 70–99)
Glucose-Capillary: 123 mg/dL — ABNORMAL HIGH (ref 70–99)
Glucose-Capillary: 136 mg/dL — ABNORMAL HIGH (ref 70–99)
Glucose-Capillary: 139 mg/dL — ABNORMAL HIGH (ref 70–99)
Glucose-Capillary: 154 mg/dL — ABNORMAL HIGH (ref 70–99)
Glucose-Capillary: 164 mg/dL — ABNORMAL HIGH (ref 70–99)
Glucose-Capillary: 188 mg/dL — ABNORMAL HIGH (ref 70–99)
Glucose-Capillary: 282 mg/dL — ABNORMAL HIGH (ref 70–99)
Glucose-Capillary: 330 mg/dL — ABNORMAL HIGH (ref 70–99)

## 2021-10-12 SURGERY — DEBRIDEMENT, WOUND
Anesthesia: Monitor Anesthesia Care | Site: Foot | Laterality: Left

## 2021-10-12 MED ORDER — SODIUM CHLORIDE 0.9 % IV SOLN: INTRAVENOUS | Status: DC

## 2021-10-12 MED ORDER — LIDOCAINE HCL (PF) 1 % IJ SOLN
INTRAMUSCULAR | Status: AC
  Filled 2021-10-12: qty 30

## 2021-10-12 MED ORDER — 0.9 % SODIUM CHLORIDE (POUR BTL) OPTIME
TOPICAL | Status: DC | PRN
  Administered 2021-10-12: 1000 mL

## 2021-10-12 MED ORDER — LACTATED RINGERS IV SOLN: INTRAVENOUS | Status: DC | PRN

## 2021-10-12 MED ORDER — LIDOCAINE HCL 1 % IJ SOLN
INTRAMUSCULAR | Status: DC | PRN
  Administered 2021-10-12: 10 mL

## 2021-10-12 MED ORDER — BUPIVACAINE HCL (PF) 0.25 % IJ SOLN
INTRAMUSCULAR | Status: AC
  Filled 2021-10-12: qty 30

## 2021-10-12 MED ORDER — SODIUM CHLORIDE (PF) 0.9 % IJ SOLN
INTRAMUSCULAR | Status: DC | PRN
  Administered 2021-10-12: 1000 mL
  Administered 2021-10-12: 3000 mL

## 2021-10-12 MED ORDER — PROPOFOL 500 MG/50ML IV EMUL
INTRAVENOUS | Status: DC | PRN
  Administered 2021-10-12: 200 ug/kg/min via INTRAVENOUS

## 2021-10-12 MED ORDER — PROPOFOL 10 MG/ML IV BOLUS
INTRAVENOUS | Status: AC
  Filled 2021-10-12: qty 20

## 2021-10-12 MED ORDER — INSULIN ASPART 100 UNIT/ML IJ SOLN
0.0000 [IU] | Freq: Three times a day (TID) | INTRAMUSCULAR | Status: DC
  Administered 2021-10-12: 3 [IU] via SUBCUTANEOUS
  Administered 2021-10-13 – 2021-10-14 (×4): 2 [IU] via SUBCUTANEOUS

## 2021-10-12 MED ORDER — HYDROMORPHONE HCL 1 MG/ML IJ SOLN
0.2500 mg | INTRAMUSCULAR | Status: DC | PRN
  Administered 2021-10-12: 0.5 mg via INTRAVENOUS

## 2021-10-12 MED ORDER — HYDROMORPHONE HCL 1 MG/ML IJ SOLN
INTRAMUSCULAR | Status: AC
  Filled 2021-10-12: qty 1

## 2021-10-12 MED ORDER — VANCOMYCIN HCL 1000 MG IV SOLR
INTRAVENOUS | Status: DC | PRN
  Administered 2021-10-12: 1000 mg

## 2021-10-12 MED ORDER — ACETAMINOPHEN 500 MG PO TABS: 1000.0000 mg | ORAL_TABLET | Freq: Once | ORAL | Status: DC

## 2021-10-12 MED ORDER — ACETAMINOPHEN 500 MG PO TABS
ORAL_TABLET | ORAL | Status: AC
  Administered 2021-10-12: 1000 mg
  Filled 2021-10-12: qty 2

## 2021-10-12 MED ORDER — VANCOMYCIN HCL 1000 MG IV SOLR
INTRAVENOUS | Status: AC
  Filled 2021-10-12: qty 20

## 2021-10-12 MED ORDER — OXYCODONE HCL 5 MG PO TABS: 5.0000 mg | ORAL_TABLET | Freq: Once | ORAL | Status: DC | PRN

## 2021-10-12 MED ORDER — CHLORHEXIDINE GLUCONATE 0.12 % MT SOLN
OROMUCOSAL | Status: AC
  Administered 2021-10-12: 15 mL
  Filled 2021-10-12: qty 15

## 2021-10-12 MED ORDER — INSULIN ASPART 100 UNIT/ML IJ SOLN
4.0000 [IU] | Freq: Once | INTRAMUSCULAR | Status: AC
  Administered 2021-10-12: 4 [IU] via SUBCUTANEOUS

## 2021-10-12 MED ORDER — LIDOCAINE HCL (CARDIAC) PF 100 MG/5ML IV SOSY
PREFILLED_SYRINGE | INTRAVENOUS | Status: DC | PRN
  Administered 2021-10-12: 100 mg via INTRATRACHEAL

## 2021-10-12 MED ORDER — SODIUM CHLORIDE 0.9 % IV SOLN: INTRAVENOUS | Status: DC | PRN

## 2021-10-12 MED ORDER — OXYCODONE HCL 5 MG/5ML PO SOLN: 5.0000 mg | Freq: Once | ORAL | Status: DC | PRN

## 2021-10-12 MED ORDER — CEFAZOLIN SODIUM-DEXTROSE 2-4 GM/100ML-% IV SOLN
INTRAVENOUS | Status: AC
  Filled 2021-10-12: qty 100

## 2021-10-12 MED ORDER — ONDANSETRON HCL 4 MG/2ML IJ SOLN: 4.0000 mg | Freq: Once | INTRAMUSCULAR | Status: DC | PRN

## 2021-10-12 SURGICAL SUPPLY — 43 items
BLADE AVERAGE 25X9 (BLADE) ×2 IMPLANT
BNDG ELASTIC 4X5.8 VLCR STR LF (GAUZE/BANDAGES/DRESSINGS) ×1 IMPLANT
BNDG ESMARK 4X9 LF (GAUZE/BANDAGES/DRESSINGS) IMPLANT
BNDG GAUZE ELAST 4 BULKY (GAUZE/BANDAGES/DRESSINGS) ×1 IMPLANT
COVER SURGICAL LIGHT HANDLE (MISCELLANEOUS) ×2 IMPLANT
CUFF TOURN SGL QUICK 18X4 (TOURNIQUET CUFF) ×1 IMPLANT
CUFF TOURN SGL QUICK 34 (TOURNIQUET CUFF)
CUFF TRNQT CYL 34X4.125X (TOURNIQUET CUFF) IMPLANT
DRAPE U-SHAPE 47X51 STRL (DRAPES) ×2 IMPLANT
DRSG XEROFORM 1X8 (GAUZE/BANDAGES/DRESSINGS) ×1 IMPLANT
ELECT CAUTERY BLADE 6.4 (BLADE) ×2 IMPLANT
ELECT REM PT RETURN 9FT ADLT (ELECTROSURGICAL) ×2
ELECTRODE REM PT RTRN 9FT ADLT (ELECTROSURGICAL) ×1 IMPLANT
GAUZE PACKING IODOFORM 1/4X15 (PACKING) ×1 IMPLANT
GAUZE SPONGE 4X4 12PLY STRL (GAUZE/BANDAGES/DRESSINGS) ×1 IMPLANT
GLOVE SURG ENC MOIS LTX SZ7.5 (GLOVE) ×2 IMPLANT
GLOVE SURG UNDER POLY LF SZ7.5 (GLOVE) ×2 IMPLANT
GOWN STRL REUS W/ TWL LRG LVL3 (GOWN DISPOSABLE) ×2 IMPLANT
GOWN STRL REUS W/TWL LRG LVL3 (GOWN DISPOSABLE) ×2
HANDPIECE INTERPULSE COAX TIP (DISPOSABLE) ×1
KIT BASIN OR (CUSTOM PROCEDURE TRAY) ×2 IMPLANT
KIT MARKER MARGIN INK (KITS) ×1 IMPLANT
KIT TURNOVER KIT B (KITS) ×2 IMPLANT
MANIFOLD NEPTUNE II (INSTRUMENTS) ×2 IMPLANT
NDL BIOPSY JAMSHIDI 8X6 (NEEDLE) IMPLANT
NDL HYPO 25GX1X1/2 BEV (NEEDLE) IMPLANT
NEEDLE BIOPSY JAMSHIDI 8X6 (NEEDLE) IMPLANT
NEEDLE HYPO 25GX1X1/2 BEV (NEEDLE) ×2 IMPLANT
NS IRRIG 1000ML POUR BTL (IV SOLUTION) ×2 IMPLANT
PACK ORTHO EXTREMITY (CUSTOM PROCEDURE TRAY) ×2 IMPLANT
PAD ARMBOARD 7.5X6 YLW CONV (MISCELLANEOUS) ×4 IMPLANT
SET HNDPC FAN SPRY TIP SCT (DISPOSABLE) ×1 IMPLANT
SOL PREP POV-IOD 4OZ 10% (MISCELLANEOUS) ×4 IMPLANT
STAPLER VISISTAT 35W (STAPLE) ×1 IMPLANT
SUT ETHILON 2 0 FS 18 (SUTURE) ×3 IMPLANT
SUT ETHILON 3 0 FSL (SUTURE) ×2 IMPLANT
SUT VIC AB 2-0 CT1 27 (SUTURE) ×2
SUT VIC AB 2-0 CT1 TAPERPNT 27 (SUTURE) IMPLANT
SYR CONTROL 10ML LL (SYRINGE) IMPLANT
TOWEL GREEN STERILE (TOWEL DISPOSABLE) ×2 IMPLANT
TOWEL GREEN STERILE FF (TOWEL DISPOSABLE) ×2 IMPLANT
TUBE CONNECTING 12X1/4 (SUCTIONS) ×2 IMPLANT
YANKAUER SUCT BULB TIP NO VENT (SUCTIONS) ×2 IMPLANT

## 2021-10-12 NOTE — Transfer of Care (Signed)
Immediate Anesthesia Transfer of Care Note  Patient: [name]  Procedure(s) Performed: LEFT FOOT WOUND DEBRIDEMENT AND IRRIGATION, 1ST METATARSAL RESECTION (Left: Foot)  Patient Location: PACU  Anesthesia Type:MAC  Level of Consciousness: awake, alert , oriented and patient cooperative  Airway & Oxygen Therapy: Patient Spontanous Breathing  Post-op Assessment: Report given to RN, Post -op Vital signs reviewed and stable and Patient moving all extremities X 4  Post vital signs: Reviewed and stable  Last Vitals:  Vitals Value Taken Time  BP 120/62 10/12/21 1120  Temp    Pulse 86 10/12/21 1122  Resp 22 10/12/21 1122  SpO2 98 % 10/12/21 1122  Vitals shown include unvalidated device data.  Last Pain:  Vitals:   10/12/21 1002  TempSrc: Oral  PainSc:       Patients Stated Pain Goal: 3 (79/43/27 6147)  Complications: No notable events documented.

## 2021-10-12 NOTE — Telephone Encounter (Signed)
Attempted to call patient's wife for post-op update - no answer, unable to leave voicemail as mailbox was full.

## 2021-10-12 NOTE — Plan of Care (Signed)

## 2021-10-12 NOTE — Progress Notes (Addendum)
PROGRESS NOTE  [name] OFB:[id number] DOB: Feb 03, 1951 DOA: 10/10/2021 PCP: [facility]  HPI/Recap of past 24 hours: [name] is a 71 y.o. male with medical history significant of DM2, CKD4, HTN.  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  Who presented to [facility] ED from home with ongoing ulceration of ball area of L foot and worsening renal function.  Followed by podiatry, with concern for osteomyelitis.  Admitted for left foot gas gangrene and AKI on CKD 4.  Post I&D on 10/10/2021 by podiatry Dr March Rummage.  Patient also seen by nephrology, Dr. Hollie Salk.  S/p left foot wound I&D and left first metatarsal resection 1/11.  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  Start date 1/8. Blood cultures x2: Negative to date. Wound culture 1/9: No group A strep or staph aureus isolated. Left foot abscess culture 1/9: Few Streptococcus anginosus and few Proteus mirabilis, cultures reintubated. Pain is controlled. Rest of management per podiatry.  AKI on CKD 4, the setting of sepsis and recent Bactrim administration, improving Baseline creatinine appears to be 4 Presented with creatinine greater than 5 Nephrology follow-up appreciated.  Suspecting AKI due to sepsis and recent Bactrim use. UA relatively bland.  Renal ultrasound without obstruction. Creatinine gradually trending down. Closely monitor urine output.  Nonoliguric.  750 mL urine  output yesterday. Avoid nephrotoxic agents, hypotension and IV contrast unless absolutely necessary. Continue to hold off torsemide/ARB/SGLT2 inhibitors Trend daily BMP. Clinically euvolemic.  IV fluids are expired, defer to nephrology regarding renewing.  Non anion gap metabolic acidosis in the setting of AKI Nephrology on board. Continue trending daily BMP.  Bicarb mildly low but stable overall.  Normal anion gap.  Hyponatremia Likely due to AKI.  Trend daily BMP.  Elevated liver chemistries, unclear etiology Continue to monitor closely Avoid hepatotoxic agents. Trend LFTs, recheck tomorrow.  Hypertension BP is currently at goal Currently not on antihypertensives to avoid hypotension No ARB or diuretics at this time. Norvasc okay if needed Continue to closely monitor vital signs.  Type 2 diabetes with hyperglycemia Hemoglobin A1c 8.3 on 10/10/2021. Continue insulin sliding scale.  Good inpatient control. Avoid hypoglycemia.  Anemia of chronic disease Drop in hemoglobin 8.4 from 9.8.  Hemoglobin stable in the mid 8 g range over the last 48 hours. Continue to closely monitor H&H.       Code Status: Full code Family Communication: Full code none at bedside Disposition Plan: Pending clinical improvement and clearance by specialist.  Consultants: Nephrology Podiatry  Procedures: Post I&D on 10/10/2021 and 1/11.  Antimicrobials: IV Zosyn IV linezolid  DVT prophylaxis: Subcu heparin 3 times daily  Status is: Inpatient  Patient requires at least 2 midnights for further evaluation and treatment of present condition.  Subjective: Seen postprocedure this afternoon.  Spouse at bedside.  Reports mild postop pain but improving after oral meds that he got recently.  No other complaints.  Objective: Vitals:  10/12/21 1002 10/12/21 1120 10/12/21 1135 10/12/21 1144  BP: (!) 153/64 120/62 126/60 130/65  Pulse: 98 85 85 85  Resp: 18 18 14 12   Temp: 99 F (37.2 C)  98.5 F (36.9 C)  98.5 F (36.9 C)  TempSrc: Oral     SpO2: 96% 98% 100% 98%  Weight: 94.3 kg     Height: 6' (1.829 m)       Intake/Output Summary (Last 24 hours) at 10/12/2021 1505 Last data filed at 10/12/2021 1121 Gross per 24 hour  Intake 1680.79 ml  Output 775 ml  Net 905.79 ml   Filed Weights   10/10/21 0026 10/12/21 1002  Weight: 93 kg 94.3 kg    Exam:  General exam: Middle-age male, moderately built and nourished lying comfortably propped up in bed without distress. Respiratory system: Clear to auscultation. Respiratory effort normal. Cardiovascular system: S1 & S2 heard, RRR. No JVD, murmurs, rubs, gallops or clicks. No pedal edema.  Telemetry personally reviewed: Sinus rhythm. Gastrointestinal system: Abdomen is nondistended, soft and nontender. No organomegaly or masses felt. Normal bowel sounds heard. Central nervous system: Alert and oriented. No focal neurological deficits. Extremities: Symmetric 5 x 5 power.  Left foot postop dressing clean and dry. Skin: No rashes, lesions or ulcers Psychiatry: Judgement and insight appear normal. Mood & affect appropriate.     Data Reviewed: CBC: Recent Labs  Lab 10/10/21 0028 10/11/21 0340 10/12/21 0219  WBC 24.7* 23.3* 23.3*  NEUTROABS 20.0*  --   --   HGB 9.8* 8.4* 8.5*  HCT 29.7* 26.2* 26.4*  MCV 87.9 87.0 88.0  PLT 309 291 188   Basic Metabolic Panel: Recent Labs  Lab 10/10/21 0028 10/11/21 0340 10/12/21 0219  NA 134* 132* 129*  K 4.2 4.9 4.8  CL 106 104 103  CO2 18* 20* 17*  GLUCOSE 108* 198* 145*  BUN 44* 39* 42*  CREATININE 5.25* 4.73* 4.61*  CALCIUM 7.6* 7.4* 7.3*  PHOS  --   --  3.5   GFR: Estimated Creatinine Clearance: 18.3 mL/min (A) (by C-G formula based on SCr of 4.61 mg/dL (H)). Liver Function Tests: Recent Labs  Lab 10/10/21 0028 10/12/21 0219  AST 51*  --   ALT 45*  --   ALKPHOS 131*  --   BILITOT 0.3  --   PROT 8.0  --   ALBUMIN 2.1* 1.5*    HbA1C: Recent Labs     10/10/21 0250  HGBA1C 8.3*   CBG: Recent Labs  Lab 10/12/21 0411 10/12/21 0747 10/12/21 1006 10/12/21 1120 10/12/21 1205  GLUCAP 154* 139* 136* 123* 123*   Urine analysis:    Component Value Date/Time   COLORURINE YELLOW 10/10/2021 2033   APPEARANCEUR CLEAR 10/10/2021 2033   LABSPEC 1.015 10/10/2021 2033   PHURINE 6.0 10/10/2021 2033   GLUCOSEU NEGATIVE 10/10/2021 2033   HGBUR MODERATE (A) 10/10/2021 2033   [place] NEGATIVE 10/10/2021 2033   [place] NEGATIVE 10/10/2021 2033   PROTEINUR 100 (A) 10/10/2021 2033   NITRITE NEGATIVE 10/10/2021 2033   LEUKOCYTESUR NEGATIVE 10/10/2021 2033    Recent Results (from the past 240 hour(s))  WOUND CULTURE     Status: Abnormal   Collection Time: 10/06/21  5:02 PM   Specimen: Wound  Result Value Ref Range Status   MICRO NUMBER: [id number]  Final   SPECIMEN QUALITY: Adequate  Final   SOURCE: WOUND (SITE NOT SPECIFIED)  Final   STATUS: FINAL  Final   GRAM STAIN:   Final  Few Polymorphonuclear leukocytes No epithelial cells seen Few Gram positive cocci in pairs Few Gram negative bacilli   ISOLATE 1: Citrobacter koseri (A)  Final    Comment: Heavy growth of Citrobacter koseri      Susceptibility   Citrobacter koseri - AEROBIC CULT, GRAM STAIN NEGATIVE 1    AMOX/CLAVULANIC <=2 Sensitive     CEFAZOLIN* <=4 Not Reportable      * For infections other than uncomplicated UTI caused by E. coli, K. pneumoniae or P. mirabilis: Cefazolin is resistant if MIC > or = 8 mcg/mL. (Distinguishing susceptible versus intermediate for isolates with MIC < or = 4 mcg/mL requires additional testing.)     CEFTAZIDIME <=1 Sensitive     CEFEPIME <=1 Sensitive     CEFTRIAXONE <=1 Sensitive     CIPROFLOXACIN <=0.25 Sensitive     LEVOFLOXACIN <=0.12 Sensitive     GENTAMICIN <=1 Sensitive     IMIPENEM 1 Sensitive     PIP/TAZO <=4 Sensitive     TOBRAMYCIN <=1 Sensitive     TRIMETH/SULFA* <=20 Sensitive      * For infections other than  uncomplicated UTI caused by E. coli, K. pneumoniae or P. mirabilis: Cefazolin is resistant if MIC > or = 8 mcg/mL. (Distinguishing susceptible versus intermediate for isolates with MIC < or = 4 mcg/mL requires additional testing.) Legend: S = Susceptible  I = Intermediate R = Resistant  NS = Not susceptible * = Not tested  NR = Not reported **NN = See antimicrobic comments   Aerobic/Anaerobic Culture w Gram Stain (surgical/deep wound)     Status: None (Preliminary result)   Collection Time: 10/10/21 12:20 AM   Specimen: PATH Other; Tissue  Result Value Ref Range Status   Specimen Description WOUND  Final   Special Requests   Final    FIRST METATARSAL BONE Performed at [facility], [place] [address] [place], [place] [address]    Gram Stain PENDING  Incomplete   Culture PENDING  Incomplete   Report Status PENDING  Incomplete  Resp Panel by RT-PCR (Flu A&B, Covid) Nasopharyngeal Swab     Status: None   Collection Time: 10/10/21 12:28 AM   Specimen: Nasopharyngeal Swab; Nasopharyngeal(NP) swabs in vial transport medium  Result Value Ref Range Status   SARS Coronavirus 2 by RT PCR NEGATIVE NEGATIVE Final    Comment: (NOTE) SARS-CoV-2 target nucleic acids are NOT DETECTED.  The SARS-CoV-2 RNA is generally detectable in upper respiratory specimens during the acute phase of infection. The lowest concentration of SARS-CoV-2 viral copies this assay can detect is 138 copies/mL. A negative result does not preclude SARS-Cov-2 infection and should not be used as the sole basis for treatment or other patient management decisions. A negative result may occur with  improper specimen collection/handling, submission of specimen other than nasopharyngeal swab, presence of viral mutation(s) within the areas targeted by this assay, and inadequate number of viral copies(<138 copies/mL). A negative result must be combined with clinical observations, patient history, and  epidemiological information. The expected result is Negative.  Fact Sheet for Patients:  EntrepreneurPulse.com.au  Fact Sheet for Healthcare Providers:  IncredibleEmployment.be  This test is no t yet approved or cleared by the [place] FDA and  has been authorized for detection and/or diagnosis of SARS-CoV-2 by FDA under an Emergency Use Authorization (EUA). This EUA will remain  in effect (meaning this test can be used) for the duration of the COVID-19 declaration under Section 564(b)(1) of the Act, 21  U.S.C.section 360bbb-3(b)(1), unless the authorization is terminated  or revoked sooner.       Influenza A by PCR NEGATIVE NEGATIVE Final   Influenza B by PCR NEGATIVE NEGATIVE Final    Comment: (NOTE) The Xpert Xpress SARS-CoV-2/FLU/RSV plus assay is intended as an aid in the diagnosis of influenza from Nasopharyngeal swab specimens and should not be used as a sole basis for treatment. Nasal washings and aspirates are unacceptable for Xpert Xpress SARS-CoV-2/FLU/RSV testing.  Fact Sheet for Patients: EntrepreneurPulse.com.au  Fact Sheet for Healthcare Providers: IncredibleEmployment.be  This test is not yet approved or cleared by the [place] FDA and has been authorized for detection and/or diagnosis of SARS-CoV-2 by FDA under an Emergency Use Authorization (EUA). This EUA will remain in effect (meaning this test can be used) for the duration of the COVID-19 declaration under Section 564(b)(1) of the Act, 21 U.S.C. section 360bbb-3(b)(1), unless the authorization is terminated or revoked.  Performed at [facility], [place] [address], [place], [place] [address]   Blood culture (routine x 2)     Status: None (Preliminary result)   Collection Time: 10/10/21  1:00 AM   Specimen: BLOOD  Result Value Ref Range Status   Specimen Description BLOOD LEFT ARM  Final   Special Requests   Final     BOTTLES DRAWN AEROBIC AND ANAEROBIC Blood Culture adequate volume   Culture   Final    NO GROWTH 2 DAYS Performed at [facility], [place] [address] [place], [place] [address]    Report Status PENDING  Incomplete  Blood culture (routine x 2)     Status: None (Preliminary result)   Collection Time: 10/10/21  1:05 AM   Specimen: BLOOD  Result Value Ref Range Status   Specimen Description BLOOD RIGHT ARM  Final   Special Requests   Final    BOTTLES DRAWN AEROBIC AND ANAEROBIC Blood Culture results may not be optimal due to an inadequate volume of blood received in culture bottles   Culture   Final    NO GROWTH 2 DAYS Performed at [facility], [place] [address] [place], [place] [address]    Report Status PENDING  Incomplete  Aerobic Culture w Gram Stain (superficial specimen)     Status: None   Collection Time: 10/10/21  6:24 AM   Specimen: Wound  Result Value Ref Range Status   Specimen Description WOUND  Final   Special Requests SINOSP  Final   Gram Stain   Final    ABUNDANT WBC PRESENT, PREDOMINANTLY PMN MODERATE GRAM POSITIVE COCCI    Culture   Final    RARE MULTIPLE ORGANISMS PRESENT, NONE PREDOMINANT NO GROUP A STREP (S.PYOGENES) ISOLATED NO STAPHYLOCOCCUS AUREUS ISOLATED Performed at [facility], [place] [address] [place], [place] [address]    Report Status 10/12/2021 FINAL  Final  Aerobic/Anaerobic Culture w Gram Stain (surgical/deep wound)     Status: None (Preliminary result)   Collection Time: 10/10/21  9:49 PM   Specimen: Abscess; Wound  Result Value Ref Range Status   Specimen Description ABSCESS LEFT FOOT  Final   Special Requests NONE  Final   Gram Stain   Final    FEW WBC PRESENT, PREDOMINANTLY PMN FEW GRAM POSITIVE COCCI IN CHAINS    Culture   Final    FEW STREPTOCOCCUS ANGINOSIS FEW PROTEUS MIRABILIS CULTURE REINCUBATED FOR BETTER GROWTH Performed at [facility], [place] [address] [place], [place] [address]  Report Status PENDING   Incomplete  MRSA Next Gen by PCR, Nasal     Status: None   Collection Time: 10/11/21  5:54 AM   Specimen: Nasal Mucosa; Nasal Swab  Result Value Ref Range Status   MRSA by PCR Next Gen NOT DETECTED NOT DETECTED Final    Comment: (NOTE) The GeneXpert MRSA Assay (FDA approved for NASAL specimens only), is one component of a comprehensive MRSA colonization surveillance program. It is not intended to diagnose MRSA infection nor to guide or monitor treatment for MRSA infections. Test performance is not FDA approved in patients less than 37 years old. Performed at [facility], [place] [address], [place], [place] [address]       Studies: No results found.  Scheduled Meds:  (feeding supplement) PROSource Plus  30 mL Oral BID BM   atorvastatin  80 mg Oral Daily   feeding supplement (NEPRO CARB STEADY)  237 mL Oral BID BM   heparin injection (subcutaneous)  5,000 Units Subcutaneous Q8H   HYDROmorphone       insulin aspart  0-6 Units Subcutaneous Q4H   multivitamin with minerals  1 tablet Oral Daily   pregabalin  75 mg Oral BID   senna-docusate  2 tablet Oral Daily   sodium hypochlorite   Irrigation Daily    Continuous Infusions:  sodium chloride Stopped (10/12/21 1015)   ceFAZolin     linezolid (ZYVOX) IV 600 mg (10/12/21 0345)   piperacillin-tazobactam (ZOSYN)  IV 2.25 g (10/12/21 1344)     LOS: 2 days    Vernell Leep, MD,  FACP, [facility], [facility], [facility] (Care Management Physician Certified) [place]  To contact the attending provider between 7A-7P or the covering provider during after hours 7P-7A, please log into the web site [email or web address] and access using universal [place] password for that web site. If you do not have the password, please call the hospital operator.

## 2021-10-12 NOTE — Brief Op Note (Signed)
10/12/2021  11:15 AM  PATIENT:  [name]  71 y.o. male  PRE-OPERATIVE DIAGNOSIS:  osteomyelitis  POST-OPERATIVE DIAGNOSIS:  osteomyelitis  PROCEDURE:  Procedure(s): LEFT FOOT WOUND DEBRIDEMENT AND IRRIGATION, 1ST METATARSAL RESECTION (Left)  SURGEON:  Surgeon(s) and Role:    * Evelina Bucy, DPM - Primary  PHYSICIAN ASSISTANT:   ASSISTANTS: none   ANESTHESIA:   local and MAC  EBL:  25 ml   BLOOD ADMINISTERED:none  DRAINS: none   LOCAL MEDICATIONS USED:  MARCAINE   , LIDOCAINE , and Amount: 5 each ml  SPECIMEN:   ID Type Source Tests Collected by Time Destination  1 : First Metatarsal bone Tissue PATH Benign ortho SURGICAL PATHOLOGY Evelina Bucy, DPM 10/12/2021 1039   A : First Metatarsal Bone Tissue PATH Other AEROBIC/ANAEROBIC CULTURE W Lonell Grandchild STAIN (SURGICAL/DEEP WOUND) Evelina Bucy, DPM 10/12/2021 1042      DISPOSITION OF SPECIMEN:  PATHOLOGY  COUNTS:  YES  TOURNIQUET:  * No tourniquets in log *  DICTATION: .Dragon Dictation  PLAN OF CARE: Admit to inpatient   PATIENT DISPOSITION:  PACU - hemodynamically stable.   Delay start of Pharmacological VTE agent (>24hrs) due to surgical blood loss or risk of bleeding: not applicable

## 2021-10-12 NOTE — Anesthesia Postprocedure Evaluation (Signed)
Anesthesia Post Note  Patient: [name]  Procedure(s) Performed: IRRIGATION AND DEBRIDEMENT left foot (Left: Foot)     Patient location during evaluation: PACU Anesthesia Type: MAC Level of consciousness: awake and alert Pain management: pain level controlled Vital Signs Assessment: post-procedure vital signs reviewed and stable Respiratory status: spontaneous breathing, nonlabored ventilation, respiratory function stable and patient connected to nasal cannula oxygen Cardiovascular status: stable and blood pressure returned to baseline Postop Assessment: no apparent nausea or vomiting Anesthetic complications: no   No notable events documented.  Last Vitals:  Vitals:   10/12/21 1135 10/12/21 1144  BP: 126/60 130/65  Pulse: 85 85  Resp: 14 12  Temp:  36.9 C  SpO2: 100% 98%    Last Pain:  Vitals:   10/12/21 1144  TempSrc:   PainSc: [name]

## 2021-10-12 NOTE — Anesthesia Preprocedure Evaluation (Addendum)
Anesthesia Evaluation  Patient identified by MRN, date of birth, ID band Patient awake    Reviewed: Allergy & Precautions, H&P , NPO status , Patient's Chart, lab work & pertinent test results  Airway Mallampati: II  TM Distance: >3 FB Neck ROM: Full    Dental no notable dental hx. (+) Edentulous Upper, Dental Advisory Given   Pulmonary neg pulmonary ROS, former smoker,    Pulmonary exam normal breath sounds clear to auscultation       Cardiovascular hypertension, Pt. on medications  Rhythm:Regular Rate:Normal     Neuro/Psych CVA (2016), No Residual Symptoms negative psych ROS   GI/Hepatic Neg liver ROS, GERD  Controlled,  Endo/Other  diabetes, Poorly Controlled, Type 2, Oral Hypoglycemic Agentsa1c 8.3  Renal/GU CRFRenal diseaseCKD 4, Cr 4.61  negative genitourinary   Musculoskeletal Osteo LLE   Abdominal   Peds  Hematology  (+) Blood dyscrasia, anemia , Hb 8.5   Anesthesia Other Findings Chronic pain- hydrocodone 5/325  Reproductive/Obstetrics negative OB ROS                            Anesthesia Physical Anesthesia Plan  ASA: 3  Anesthesia Plan: MAC   Post-op Pain Management: Tylenol PO (pre-op)   Induction: Intravenous  PONV Risk Score and Plan: 2 and Propofol infusion and TIVA  Airway Management Planned: Natural Airway and Simple Face Mask  Additional Equipment: None  Intra-op Plan:   Post-operative Plan:   Informed Consent: I have reviewed the patients History and Physical, chart, labs and discussed the procedure including the risks, benefits and alternatives for the proposed anesthesia with the patient or authorized representative who has indicated his/her understanding and acceptance.     Dental advisory given  Plan Discussed with: CRNA  Anesthesia Plan Comments:        Anesthesia Quick Evaluation

## 2021-10-12 NOTE — Op Note (Signed)
°  Patient Name: [name] DOB: [birth year]  MRN: [id number]   Date of Surgery: 10/12/21  Surgeon: Dr. Hardie Pulley, DPM Assistants: none  Pre-operative Diagnosis:  Abscess of foot, osteomyelitis left foot Post-operative Diagnosis:  same Procedures:  1) Left Foot 1st ray resection and wound excision and closure  2) Debridement and irrigation of left heel wound Pathology/Specimens: ID Type Source Tests Collected by Time Destination  1 : First Metatarsal bone Tissue PATH Benign ortho SURGICAL PATHOLOGY Evelina Bucy, DPM 10/12/2021 1039   A : First Metatarsal Bone Tissue PATH Other AEROBIC/ANAEROBIC CULTURE W Lonell Grandchild STAIN (SURGICAL/DEEP WOUND) Evelina Bucy, DPM 10/12/2021 1042    Anesthesia: MAC/local Hemostasis: * No tourniquets in log * Estimated Blood Loss: 10 ml Materials: * No implants in log * Medications: 10 ccs 0.5% marcaine plain Complications: non  Indications for Procedure:  This is a 71 y.o. male with an infection noted to the left foot. He previously underwent incision and drainage and presents for repeat debridement today.   Procedure in Detail: Patient was identified in pre-operative holding area. Formal consent was signed and the left lower extremity was marked. Patient was brought back to the operating room. Anesthesia was induced. The extremity was prepped and draped in the usual sterile fashion. Timeout was taken to confirm patient name, laterality, and procedure prior to incision.   Attention was first directed to the medial foot. The wound was reopened and dissection continued to the 1st metatarsal. The wound was probed with a freer proximally without extension noted. No purulence was noted at the medial wound. The wound bed overall was healthy with minimal necrosis. The remaining necrotic tissue was sharply excised. The ulceration submet 1 was then sharply excised.   Attention was then directed to the first metatarsal. The first metatarsal head was  transected with sagittal saw. The sesamoid complex was sharply excised. The wound was then copiously irrigated. The wound edges were re-approximated, remodeled, and were then primarily closed in layers.  Attention was then directed to the heel wound. The wound had necrosis but no continued purulence. The wound was probed with a freer with no further extension noted. The wound was debrided with a 15 blade and rongeur to bleeding viable wound base. Debridement was carried to and including the fascial tissue. The wound was then copiously irrigated with normal saline.   The foot was then dressed with xeroform over the distal incision and betadine wet to dry dressing packed into the heel wound. Patient tolerated the procedure well.  Disposition: Following a period of post-operative monitoring, patient will be transferred back to the floor.

## 2021-10-12 NOTE — Interval H&P Note (Signed)
History and Physical Interval Note:  10/12/2021 10:04 AM  [name]  has presented today for surgery, with the diagnosis of osteomyelitis.  The various methods of treatment have been discussed with the patient and family. After consideration of risks, benefits and other options for treatment, the patient has consented to  Procedure(s): DEBRIDEMENT WOUND (Left) as a surgical intervention.  The patient's history has been reviewed, patient examined, no change in status, stable for surgery.  I have reviewed the patient's chart and labs.  Questions were answered to the patient's satisfaction.     Evelina Bucy

## 2021-10-12 NOTE — Anesthesia Procedure Notes (Signed)
Procedure Name: MAC Date/Time: 10/12/2021 10:15 AM Performed by: Claris Che, CRNA Pre-anesthesia Checklist: Patient identified, Emergency Drugs available, Suction available, Patient being monitored and Timeout performed Patient Re-evaluated:Patient Re-evaluated prior to induction Oxygen Delivery Method: Simple face mask Ventilation: Oral airway inserted - appropriate to patient size Dental Injury: Teeth and Oropharynx as per pre-operative assessment

## 2021-10-12 NOTE — Progress Notes (Signed)
[facility] Progress Note    Assessment/ Plan:     AKI on CKD IV: last Cr 08/2020 was 4.0, per pt last eGFR 17% now up to 5.25 (EGFR 11%) in setting of sepsis and recent Bactrim administration. UA relatively bland.   - check renal US- no obstruction - not uremic, no need for HD--> fortunately Cr is improving 4.7 10/11/21--> 4.6 10/12/21 - Bactrim contraindicated in advanced CKD - hold torsemide/ ARB/ SGLT2i etc and all other nephrotoxic agents - no IV contrast unless absolutely necessary - daily weights, strict I/O - treating sepsis is improving - avoid hypotensive episodes- will prolong recovery   2.  Osteo of L foot with sepsis- on linezolid/ Zosyn, wound cultures and blood cultures obtained.  ABIs normal.  went to OR with podiatry 10/10/21 for debridement, back to OR, today 1/11   3. HTN: avoid ARB and diuretics, amlodipine (on home list) would be OK if needed   4.  DM II: per primary  5.  Hyponatremia: mild, continue to monitor   6.  Dispo: admitted  Subjective:    S/p 1st metatarsal resection today 10/12/21   Objective:   BP 130/65 (BP Location: Right Arm)    Pulse 85    Temp 98.5 F (36.9 C)    Resp 12    Ht 6' (1.829 m)    Wt 94.3 kg    SpO2 98%    BMI 28.21 kg/m   Intake/Output Summary (Last 24 hours) at 10/12/2021 1337 Last data filed at 10/12/2021 1121 Gross per 24 hour  Intake 1680.79 ml  Output 775 ml  Net 905.79 ml   Weight change:   Physical Exam: GEN: NAD, sitting in bed HEENT EOMI PERRL NECK no JVD PULM clear CV RRR ABD soft EXT L foot dressed, no edema bilaterally NEURO AAO x 3   Imaging: US RENAL  Result Date: 10/10/2021 CLINICAL DATA:  Acute kidney injury EXAM: RENAL / URINARY TRACT ULTRASOUND COMPLETE COMPARISON:  11/20/2018 FINDINGS: Right Kidney: Renal measurements: 9.5 x 4.9 x 5.3 cm = volume: 129.3 mL. Cortex is echogenic. Trace perinephric fluid. No hydronephrosis. Upper pole cyst measuring 3.7 cm. Midpole parapelvic cyst  measuring 14 mm. Left Kidney: Renal measurements: 9 x 5.1 x 4.8 cm = volume: 110.3 mL. Cortex is echogenic. No hydronephrosis. Small cyst lower pole measuring 15 mm. Bladder: Appears normal for degree of bladder distention. Other: None. IMPRESSION: 1. Echogenic kidneys consistent with medical renal disease. No hydronephrosis 2. Simple appearing renal cysts. Electronically Signed   By: Donavan Foil M.D.   On: 10/10/2021 15:43   MR FOOT LEFT WO CONTRAST  Result Date: 10/11/2021 CLINICAL DATA:  Chronic left foot ulcer.  Foot pain and swelling. EXAM: MRI OF THE LEFT FOOT WITHOUT CONTRAST TECHNIQUE: Multiplanar, multisequence MR imaging of the left foot was performed. No intravenous contrast was administered. 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 Soft tissue wound along the distal plantar medial aspect of the first TMT joint with a sinus tract extending into the soft tissues with focal area of inflammatory soft tissue along the plantar aspect of the foot concerning for a phlegmon. Heterogeneous low signal material within the area of the phlegmon likely reflecting packing material. Severe bone marrow edema in the first metatarsal head and sesamoids concerning for osteomyelitis. No other marrow signal abnormality. Normal alignment. No joint effusion. No marrow signal abnormality. Ligaments Collateral ligaments are intact.  Lisfranc ligament is intact. Muscles and Tendons  Flexor, peroneal and extensor compartment tendons are intact. Muscles are normal. Soft tissue No fluid collection or hematoma.  No soft tissue mass. IMPRESSION: 1. 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 Soft tissue wound along the distal plantar medial aspect of the first TMT joint with a sinus tract extending into the soft tissues with focal area of inflammatory soft tissue along the plantar aspect of the foot concerning for a phlegmon. Heterogeneous low  signal material within the area of the phlegmon likely reflecting packing material. 2. Severe bone marrow edema in the first metatarsal head and sesamoids concerning for osteomyelitis. Electronically Signed   By: Kathreen Devoid M.D.   On: 10/11/2021 10:49    Labs: BMET Recent Labs  Lab 10/10/21 0028 10/11/21 0340 10/12/21 0219  NA 134* 132* 129*  K 4.2 4.9 4.8  CL 106 104 103  CO2 18* 20* 17*  GLUCOSE 108* 198* 145*  BUN 44* 39* 42*  CREATININE 5.25* 4.73* 4.61*  CALCIUM 7.6* 7.4* 7.3*  PHOS  --   --  3.5   CBC Recent Labs  Lab 10/10/21 0028 10/11/21 0340 10/12/21 0219  WBC 24.7* 23.3* 23.3*  NEUTROABS 20.0*  --   --   HGB 9.8* 8.4* 8.5*  HCT 29.7* 26.2* 26.4*  MCV 87.9 87.0 88.0  PLT 309 291 307    Medications:     (feeding supplement) PROSource Plus  30 mL Oral BID BM   atorvastatin  80 mg Oral Daily   feeding supplement (NEPRO CARB STEADY)  237 mL Oral BID BM   heparin injection (subcutaneous)  5,000 Units Subcutaneous Q8H   HYDROmorphone       insulin aspart  0-6 Units Subcutaneous Q4H   multivitamin with minerals  1 tablet Oral Daily   pregabalin  75 mg Oral BID   senna-docusate  2 tablet Oral Daily   sodium hypochlorite   Irrigation Daily      Madelon Lips, MD 10/12/2021, 1:37 PM

## 2021-10-12 NOTE — Anesthesia Postprocedure Evaluation (Signed)
Anesthesia Post Note  Patient: [name]  Procedure(s) Performed: LEFT FOOT WOUND DEBRIDEMENT AND IRRIGATION, 1ST METATARSAL RESECTION (Left: Foot)     Patient location during evaluation: PACU Anesthesia Type: MAC Level of consciousness: awake and alert Pain management: pain level controlled Vital Signs Assessment: post-procedure vital signs reviewed and stable Respiratory status: spontaneous breathing, nonlabored ventilation and respiratory function stable Cardiovascular status: blood pressure returned to baseline and stable Postop Assessment: no apparent nausea or vomiting Anesthetic complications: no   No notable events documented.  Last Vitals:  Vitals:   10/12/21 1002 10/12/21 1120  BP: (!) 153/64 120/62  Pulse: 98 85  Resp: 18 18  Temp: 37.2 C 36.9 C  SpO2: 96% 98%    Last Pain:  Vitals:   10/12/21 1120  TempSrc:   PainSc: [place]

## 2021-10-13 ENCOUNTER — Encounter (HOSPITAL_COMMUNITY): Payer: Self-pay | Admitting: Podiatry

## 2021-10-13 DIAGNOSIS — N189 Chronic kidney disease, unspecified: Secondary | ICD-10-CM | POA: Diagnosis not present

## 2021-10-13 DIAGNOSIS — A419 Sepsis, unspecified organism: Principal | ICD-10-CM

## 2021-10-13 DIAGNOSIS — R6520 Severe sepsis without septic shock: Secondary | ICD-10-CM

## 2021-10-13 DIAGNOSIS — N179 Acute kidney failure, unspecified: Secondary | ICD-10-CM | POA: Diagnosis not present

## 2021-10-13 LAB — COMPREHENSIVE METABOLIC PANEL
ALT: 55 U/L — ABNORMAL HIGH (ref 0–44)
AST: 69 U/L — ABNORMAL HIGH (ref 15–41)
Albumin: <1.5 g/dL — ABNORMAL LOW (ref 3.5–5.0)
Alkaline Phosphatase: 128 U/L — ABNORMAL HIGH (ref 38–126)
Anion gap: 7 (ref 5–15)
BUN: 40 mg/dL — ABNORMAL HIGH (ref 8–23)
CO2: 18 mmol/L — ABNORMAL LOW (ref 22–32)
Calcium: 7.4 mg/dL — ABNORMAL LOW (ref 8.9–10.3)
Chloride: 106 mmol/L (ref 98–111)
Creatinine, Ser: 4.84 mg/dL — ABNORMAL HIGH (ref 0.61–1.24)
GFR, Estimated: 12 mL/min — ABNORMAL LOW (ref >60)
Glucose, Bld: 135 mg/dL — ABNORMAL HIGH (ref 70–99)
Potassium: 4.7 mmol/L (ref 3.5–5.1)
Sodium: 131 mmol/L — ABNORMAL LOW (ref 135–145)
Total Bilirubin: 0.5 mg/dL (ref 0.3–1.2)
Total Protein: 6.8 g/dL (ref 6.5–8.1)

## 2021-10-13 LAB — CBC
HCT: 25.1 % — ABNORMAL LOW (ref 39.0–52.0)
Hemoglobin: 8.1 g/dL — ABNORMAL LOW (ref 13.0–17.0)
MCH: 28.2 pg (ref 26.0–34.0)
MCHC: 32.3 g/dL (ref 30.0–36.0)
MCV: 87.5 fL (ref 80.0–100.0)
Platelets: 304 10*3/uL (ref 150–400)
RBC: 2.87 MIL/uL — ABNORMAL LOW (ref 4.22–5.81)
RDW: 14.5 % (ref 11.5–15.5)
WBC: 22.5 10*3/uL — ABNORMAL HIGH (ref 4.0–10.5)
nRBC: 0 % (ref 0.0–0.2)

## 2021-10-13 LAB — GLUCOSE, CAPILLARY
Glucose-Capillary: 117 mg/dL — ABNORMAL HIGH (ref 70–99)
Glucose-Capillary: 126 mg/dL — ABNORMAL HIGH (ref 70–99)
Glucose-Capillary: 224 mg/dL — ABNORMAL HIGH (ref 70–99)
Glucose-Capillary: 206 mg/dL — ABNORMAL HIGH (ref 70–99)
Glucose-Capillary: 278 mg/dL — ABNORMAL HIGH (ref 70–99)

## 2021-10-13 MED ORDER — SODIUM CHLORIDE 0.9 % IV SOLN
8.0000 mg/kg | INTRAVENOUS | Status: DC
  Administered 2021-10-13 – 2021-10-15 (×2): 750 mg via INTRAVENOUS
  Filled 2021-10-13 (×3): qty 15

## 2021-10-13 NOTE — Progress Notes (Signed)
PROGRESS NOTE  [name] QQI:[id number] DOB: 1951-04-29 DOA: 10/10/2021 PCP: [facility]  HPI/Recap of past 24 hours: [name] is a 71 y.o. male with medical history significant of DM2, CKD4, HTN.  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  Who presented to [facility] ED from home with ongoing ulceration of ball area of L foot and worsening renal function.  Followed by podiatry, with concern for osteomyelitis.  Admitted for left foot gas gangrene and AKI on CKD 4.  Post I&D on 10/10/2021 by podiatry Dr March Rummage.  Patient also seen by nephrology, Dr. Hollie Salk.  S/p left foot wound I&D and left first metatarsal resection 1/11.  Continues to spike high fevers despite broad-spectrum IV antibiotics, infectious disease consulted for assistance on 1/12.  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  Start date 1/8. Blood cultures x2: Negative to date. Wound culture 1/9: No group A strep or staph aureus isolated. Left foot abscess culture 1/9: Few Streptococcus anginosus and few Proteus mirabilis, cultures reintubated. Pain is controlled. Rest of management per podiatry.  Communicated with Dr. March Rummage, reports that during procedure yesterday, had more pus in his heel and will monitor closely and if continues to spike fevers, may need further I&D. Ongoing fever spikes up to 102.7 overnight.  Infectious disease consulted for assistance.  Discussed with Dr.  Candiss Norse.  No Bactrim use due to advanced CKD.  AKI on CKD 4, the setting of sepsis and recent Bactrim administration, improving Baseline creatinine appears to be 4 Presented with creatinine greater than 5 Nephrology follow-up appreciated.  Suspecting AKI due to sepsis and recent Bactrim use. UA relatively bland.  Renal ultrasound without obstruction. Creatinine seems to have plateaued in the high 4 range for the last 3 days which may be a new baseline for him.  Clinically not uremic and no urgent indications for dialysis. Closely monitor urine output.  Nonoliguric.   Avoid nephrotoxic agents, hypotension and IV contrast unless absolutely necessary. Continue to hold off torsemide/ARB/SGLT2 inhibitors Trend daily BMP. Clinically euvolemic.  Off IV fluids. As per nephrology, has outpatient appointment with Dr. Juleen China on 10/20/2021.  Nephrology recommends holding olmesartan until he sees his nephrologist and can resume home torsemide at discharge.  Non anion gap metabolic acidosis in the setting of AKI Nephrology on board. Continue trending daily BMP.  Bicarb mildly low but stable overall.  Normal anion gap.  Hyponatremia Likely due to AKI.  Trend daily BMP.  Elevated liver chemistries, unclear etiology Continue to monitor closely Avoid hepatotoxic agents. No significant change, stable  Hypertension BP is currently at goal Currently not on antihypertensives to avoid hypotension No ARB or diuretics at this time. Norvasc okay if needed Continue to closely monitor vital signs.  Type 2 diabetes with hyperglycemia Hemoglobin A1c 8.3 on 10/10/2021. Continue insulin sliding scale.  Mildly uncontrolled and fluctuating. Avoid hypoglycemia.  Anemia of chronic disease Drop in hemoglobin 8.4 from 9.8.  Hemoglobin  stable in the mid 8 g range over the last 72 hours. Continue to closely monitor H&H.       Code Status: Full code Family Communication: Full code none at bedside Disposition  Plan: Pending clinical improvement and clearance by specialist.  Consultants: Nephrology Podiatry  Procedures: Post I&D on 10/10/2021 and 1/11.  Antimicrobials: IV Zosyn IV linezolid  DVT prophylaxis: Subcu heparin 3 times daily  Status is: Inpatient  Patient requires at least 2 midnights for further evaluation and treatment of present condition.  Subjective: Denies complaints.  No pain reported.  Aware that he had fever last night.  Objective: Vitals:   10/12/21 1500 10/12/21 2024 10/13/21 0640 10/13/21 0740  BP: 138/66 (!) 178/78 (!) 143/67 (!) 151/66  Pulse: 97 99 97 94  Resp: 16 18 16 18   Temp: 99.4 F (37.4 C) (!) 102.7 F (39.3 C) 99.8 F (37.7 C) 98.9 F (37.2 C)  TempSrc: Oral Oral Oral Oral  SpO2: 96% 98% 99% 100%  Weight:      Height:        Intake/Output Summary (Last 24 hours) at 10/13/2021 1211 Last data filed at 10/13/2021 0827 Gross per 24 hour  Intake --  Output 650 ml  Net -650 ml   Filed Weights   10/10/21 0026 10/12/21 1002  Weight: 93 kg 94.3 kg    Exam:  General exam: Middle-age male, moderately built and nourished lying comfortably propped up in bed without distress.  Septic or toxic. Respiratory system: Clear to auscultation. Respiratory effort normal. Cardiovascular system: S1 and S2 heard, RRR.  No JVD, murmurs or pedal edema. Gastrointestinal system: Abdomen is nondistended, soft and nontender. No organomegaly or masses felt. Normal bowel sounds heard. Central nervous system: Alert and oriented. No focal neurological deficits. Extremities: Left foot postop dressing is dry and clean.  Did not remove dressing and will defer to podiatry for exam. Skin: No rashes, lesions or ulcers Psychiatry: Judgement and insight appear normal. Mood & affect appropriate.     Data Reviewed: CBC: Recent Labs  Lab 10/10/21 0028 10/11/21 0340 10/12/21 0219 10/13/21 0349  WBC 24.7* 23.3* 23.3* 22.5*  NEUTROABS 20.0*  --   --   --   HGB 9.8*  8.4* 8.5* 8.1*  HCT 29.7* 26.2* 26.4* 25.1*  MCV 87.9 87.0 88.0 87.5  PLT 309 291 307 941   Basic Metabolic Panel: Recent Labs  Lab 10/10/21 0028 10/11/21 0340 10/12/21 0219 10/13/21 0349  NA 134* 132* 129* 131*  K 4.2 4.9 4.8 4.7  CL 106 104 103 106  CO2 18* 20* 17* 18*  GLUCOSE 108* 198* 145* 135*  BUN 44* 39* 42* 40*  CREATININE 5.25* 4.73* 4.61* 4.84*  CALCIUM 7.6* 7.4* 7.3* 7.4*  PHOS  --   --  3.5  --    GFR: Estimated Creatinine Clearance: 17.4 mL/min (A) (by C-G formula based on SCr of 4.84 mg/dL (H)). Liver Function Tests: Recent Labs  Lab 10/10/21 0028 10/12/21 0219 10/13/21 0349  AST 51*  --  69*  ALT 45*  --  55*  ALKPHOS 131*  --  128*  BILITOT 0.3  --  0.5  PROT 8.0  --  6.8  ALBUMIN 2.1* 1.5* <1.5*    HbA1C: No results for input(s): HGBA1C in the last 72 hours.  CBG: Recent Labs  Lab 10/12/21 2021 10/12/21 2355 10/13/21 0357 10/13/21 0638 10/13/21 1155  GLUCAP 330* 164* 117* 126* 224*   Urine analysis:    Component Value Date/Time  COLORURINE YELLOW 10/10/2021 2033   APPEARANCEUR CLEAR 10/10/2021 2033   LABSPEC 1.015 10/10/2021 2033   PHURINE 6.0 10/10/2021 2033   GLUCOSEU NEGATIVE 10/10/2021 2033   HGBUR MODERATE (A) 10/10/2021 2033   [place] NEGATIVE 10/10/2021 2033   [place] NEGATIVE 10/10/2021 2033   PROTEINUR 100 (A) 10/10/2021 2033   NITRITE NEGATIVE 10/10/2021 2033   LEUKOCYTESUR NEGATIVE 10/10/2021 2033    Recent Results (from the past 240 hour(s))  WOUND CULTURE     Status: Abnormal   Collection Time: 10/06/21  5:02 PM   Specimen: Wound  Result Value Ref Range Status   MICRO NUMBER: [id number]  Final   SPECIMEN QUALITY: Adequate  Final   SOURCE: WOUND (SITE NOT SPECIFIED)  Final   STATUS: FINAL  Final   GRAM STAIN:   Final    Few Polymorphonuclear leukocytes No epithelial cells seen Few Gram positive cocci in pairs Few Gram negative bacilli   ISOLATE 1: Citrobacter koseri (A)  Final    Comment: Heavy growth of  Citrobacter koseri      Susceptibility   Citrobacter koseri - AEROBIC CULT, GRAM STAIN NEGATIVE 1    AMOX/CLAVULANIC <=2 Sensitive     CEFAZOLIN* <=4 Not Reportable      * For infections other than uncomplicated UTI caused by E. coli, K. pneumoniae or P. mirabilis: Cefazolin is resistant if MIC > or = 8 mcg/mL. (Distinguishing susceptible versus intermediate for isolates with MIC < or = 4 mcg/mL requires additional testing.)     CEFTAZIDIME <=1 Sensitive     CEFEPIME <=1 Sensitive     CEFTRIAXONE <=1 Sensitive     CIPROFLOXACIN <=0.25 Sensitive     LEVOFLOXACIN <=0.12 Sensitive     GENTAMICIN <=1 Sensitive     IMIPENEM 1 Sensitive     PIP/TAZO <=4 Sensitive     TOBRAMYCIN <=1 Sensitive     TRIMETH/SULFA* <=20 Sensitive      * For infections other than uncomplicated UTI caused by E. coli, K. pneumoniae or P. mirabilis: Cefazolin is resistant if MIC > or = 8 mcg/mL. (Distinguishing susceptible versus intermediate for isolates with MIC < or = 4 mcg/mL requires additional testing.) Legend: S = Susceptible  I = Intermediate R = Resistant  NS = Not susceptible * = Not tested  NR = Not reported **NN = See antimicrobic comments   Aerobic/Anaerobic Culture w Gram Stain (surgical/deep wound)     Status: None (Preliminary result)   Collection Time: 10/10/21 12:20 AM   Specimen: PATH Other; Tissue  Result Value Ref Range Status   Specimen Description WOUND  Final   Special Requests FIRST METATARSAL BONE  Final   Gram Stain NO WBC SEEN NO ORGANISMS SEEN   Final   Culture   Final    NO GROWTH < 24 HOURS Performed at [facility], [place] [address] [place], [place] [address]    Report Status PENDING  Incomplete  Resp Panel by RT-PCR (Flu A&B, Covid) Nasopharyngeal Swab     Status: None   Collection Time: 10/10/21 12:28 AM   Specimen: Nasopharyngeal Swab; Nasopharyngeal(NP) swabs in vial transport medium  Result Value Ref Range Status   SARS Coronavirus 2 by RT PCR NEGATIVE  NEGATIVE Final    Comment: (NOTE) SARS-CoV-2 target nucleic acids are NOT DETECTED.  The SARS-CoV-2 RNA is generally detectable in upper respiratory specimens during the acute phase of infection. The lowest concentration of SARS-CoV-2 viral copies this assay can detect is 138 copies/mL. A negative  result does not preclude SARS-Cov-2 infection and should not be used as the sole basis for treatment or other patient management decisions. A negative result may occur with  improper specimen collection/handling, submission of specimen other than nasopharyngeal swab, presence of viral mutation(s) within the areas targeted by this assay, and inadequate number of viral copies(<138 copies/mL). A negative result must be combined with clinical observations, patient history, and epidemiological information. The expected result is Negative.  Fact Sheet for Patients:  EntrepreneurPulse.com.au  Fact Sheet for Healthcare Providers:  IncredibleEmployment.be  This test is no t yet approved or cleared by the [place] FDA and  has been authorized for detection and/or diagnosis of SARS-CoV-2 by FDA under an Emergency Use Authorization (EUA). This EUA will remain  in effect (meaning this test can be used) for the duration of the COVID-19 declaration under Section 564(b)(1) of the Act, 21 U.S.C.section 360bbb-3(b)(1), unless the authorization is terminated  or revoked sooner.       Influenza A by PCR NEGATIVE NEGATIVE Final   Influenza B by PCR NEGATIVE NEGATIVE Final    Comment: (NOTE) The Xpert Xpress SARS-CoV-2/FLU/RSV plus assay is intended as an aid in the diagnosis of influenza from Nasopharyngeal swab specimens and should not be used as a sole basis for treatment. Nasal washings and aspirates are unacceptable for Xpert Xpress SARS-CoV-2/FLU/RSV testing.  Fact Sheet for Patients: EntrepreneurPulse.com.au  Fact Sheet for Healthcare  Providers: IncredibleEmployment.be  This test is not yet approved or cleared by the [place] FDA and has been authorized for detection and/or diagnosis of SARS-CoV-2 by FDA under an Emergency Use Authorization (EUA). This EUA will remain in effect (meaning this test can be used) for the duration of the COVID-19 declaration under Section 564(b)(1) of the Act, 21 U.S.C. section 360bbb-3(b)(1), unless the authorization is terminated or revoked.  Performed at [facility], [place] [address] [place], [place] [address]   Blood culture (routine x 2)     Status: None (Preliminary result)   Collection Time: 10/10/21  1:00 AM   Specimen: BLOOD  Result Value Ref Range Status   Specimen Description BLOOD LEFT ARM  Final   Special Requests   Final    BOTTLES DRAWN AEROBIC AND ANAEROBIC Blood Culture adequate volume   Culture   Final    NO GROWTH 3 DAYS Performed at [facility], [place] [address] [place], [place] [address]    Report Status PENDING  Incomplete  Blood culture (routine x 2)     Status: None (Preliminary result)   Collection Time: 10/10/21  1:05 AM   Specimen: BLOOD  Result Value Ref Range Status   Specimen Description BLOOD RIGHT ARM  Final   Special Requests   Final    BOTTLES DRAWN AEROBIC AND ANAEROBIC Blood Culture results may not be optimal due to an inadequate volume of blood received in culture bottles   Culture   Final    NO GROWTH 3 DAYS Performed at [facility], [place] [address], [place], [place] [address]    Report Status PENDING  Incomplete  Aerobic Culture w Gram Stain (superficial specimen)     Status: None   Collection Time: 10/10/21  6:24 AM   Specimen: Wound  Result Value Ref Range Status   Specimen Description WOUND  Final   Special Requests SINOSP  Final   Gram Stain   Final    ABUNDANT WBC PRESENT, PREDOMINANTLY PMN MODERATE GRAM POSITIVE COCCI    Culture  Final    RARE MULTIPLE ORGANISMS PRESENT, NONE  PREDOMINANT NO GROUP A STREP (S.PYOGENES) ISOLATED NO STAPHYLOCOCCUS AUREUS ISOLATED Performed at [facility], [place] [address] [place], [place] [address]    Report Status 10/12/2021 FINAL  Final  Aerobic/Anaerobic Culture w Gram Stain (surgical/deep wound)     Status: None (Preliminary result)   Collection Time: 10/10/21  9:49 PM   Specimen: Abscess; Wound  Result Value Ref Range Status   Specimen Description ABSCESS LEFT FOOT  Final   Special Requests NONE  Final   Gram Stain   Final    FEW WBC PRESENT, PREDOMINANTLY PMN FEW GRAM POSITIVE COCCI IN CHAINS    Culture   Final    FEW STREPTOCOCCUS ANGINOSIS FEW PROTEUS MIRABILIS CULTURE REINCUBATED FOR BETTER GROWTH Performed at [facility], [place] [address] [place], [place] [address]    Report Status PENDING  Incomplete  MRSA Next Gen by PCR, Nasal     Status: None   Collection Time: 10/11/21  5:54 AM   Specimen: Nasal Mucosa; Nasal Swab  Result Value Ref Range Status   MRSA by PCR Next Gen NOT DETECTED NOT DETECTED Final    Comment: (NOTE) The GeneXpert MRSA Assay (FDA approved for NASAL specimens only), is one component of a comprehensive MRSA colonization surveillance program. It is not intended to diagnose MRSA infection nor to guide or monitor treatment for MRSA infections. Test performance is not FDA approved in patients less than 23 years old. Performed at [facility], [place] [address] [place], [place] [address]       Studies: No results found.  Scheduled Meds:  (feeding supplement) PROSource Plus  30 mL Oral BID BM   atorvastatin  80 mg Oral Daily   feeding supplement (NEPRO CARB STEADY)  237 mL Oral BID BM   heparin injection (subcutaneous)  5,000 Units Subcutaneous Q8H   insulin aspart  0-6 Units Subcutaneous TID WC   multivitamin with minerals  1 tablet Oral Daily   pregabalin  75 mg Oral BID   senna-docusate  2 tablet Oral Daily   sodium hypochlorite   Irrigation Daily    Continuous  Infusions:  sodium chloride Stopped (10/12/21 1015)   linezolid (ZYVOX) IV 600 mg (10/13/21 0629)   piperacillin-tazobactam (ZOSYN)  IV 2.25 g (10/13/21 1211)     LOS: 3 days    Vernell Leep, MD,  FACP, [facility], [facility], [facility] (Care Management Physician Certified) [place]  To contact the attending provider between 7A-7P or the covering provider during after hours 7P-7A, please log into the web site [email or web address] and access using universal [place] password for that web site. If you do not have the password, please call the hospital operator.

## 2021-10-13 NOTE — Progress Notes (Signed)
[facility] Progress Note    Assessment/ Plan:     AKI on CKD IV: last Cr 08/2020 was 4.0, per pt last eGFR 17% now up to 5.25 (EGFR 11%) in setting of sepsis and recent Bactrim administration. UA relatively bland.   - check renal US- no obstruction - not uremic, no need for HD--> fortunately Cr is improving 4.7 10/11/21--> 4.6 10/12/21--> 4.7 10/13/21 - Bactrim contraindicated in advanced CKD - hold torsemide/ ARB/ SGLT2i etc and all other nephrotoxic agents - no IV contrast unless absolutely necessary - daily weights, strict I/O - Has outpt appt with Dr Abigail Butts 10/20/21 - would hold olmesartan until he is seen by OP nephrologist, can resume home torsemide on d/c   2.  Osteo of L foot with sepsis- on linezolid/ Zosyn, wound cultures and blood cultures obtained.  ABIs normal.  went to OR with podiatry 10/10/21 for debridement, back to OR1/11   3. HTN: avoid ARB and diuretics, amlodipine (on home list) OK   4.  DM II: per primary  5.  Hyponatremia: mild, continue to monitor   6.  Dispo: admitted  Subjective:    No issues this AM- talking on phone, says he is feeling good.  Dressing to be changed soon.     Objective:   BP (!) 151/66 (BP Location: Left Arm)    Pulse 94    Temp 98.9 F (37.2 C) (Oral)    Resp 18    Ht 6' (1.829 m)    Wt 94.3 kg    SpO2 100%    BMI 28.21 kg/m   Intake/Output Summary (Last 24 hours) at 10/13/2021 1105 Last data filed at 10/13/2021 0827 Gross per 24 hour  Intake 405.17 ml  Output 675 ml  Net -269.83 ml   Weight change:   Physical Exam: GEN: NAD, sitting in bed HEENT EOMI PERRL NECK no JVD PULM clear CV RRR ABD soft EXT L foot dressed, no edema bilaterally NEURO AAO x 3   Imaging: No results found.  Labs: BMET Recent Labs  Lab 10/10/21 0028 10/11/21 0340 10/12/21 0219 10/13/21 0349  NA 134* 132* 129* 131*  K 4.2 4.9 4.8 4.7  CL 106 104 103 106  CO2 18* 20* 17* 18*  GLUCOSE 108* 198* 145* 135*  BUN 44* 39* 42* 40*   CREATININE 5.25* 4.73* 4.61* 4.84*  CALCIUM 7.6* 7.4* 7.3* 7.4*  PHOS  --   --  3.5  --    CBC Recent Labs  Lab 10/10/21 0028 10/11/21 0340 10/12/21 0219 10/13/21 0349  WBC 24.7* 23.3* 23.3* 22.5*  NEUTROABS 20.0*  --   --   --   HGB 9.8* 8.4* 8.5* 8.1*  HCT 29.7* 26.2* 26.4* 25.1*  MCV 87.9 87.0 88.0 87.5  PLT 309 291 307 304    Medications:     (feeding supplement) PROSource Plus  30 mL Oral BID BM   atorvastatin  80 mg Oral Daily   feeding supplement (NEPRO CARB STEADY)  237 mL Oral BID BM   heparin injection (subcutaneous)  5,000 Units Subcutaneous Q8H   insulin aspart  0-6 Units Subcutaneous TID WC   multivitamin with minerals  1 tablet Oral Daily   pregabalin  75 mg Oral BID   senna-docusate  2 tablet Oral Daily   sodium hypochlorite   Irrigation Daily      Madelon Lips, MD 10/13/2021, 11:05 AM

## 2021-10-13 NOTE — Plan of Care (Signed)

## 2021-10-13 NOTE — Progress Notes (Signed)
Physical Therapy Evaluation Patient Details Name: [name] MRN: [id number] DOB: August 13, 1951 Today's Date: 10/13/2021  History of Present Illness  71 yo male  was admitted 1/9 for sepsis and received I and D on L foot.  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  ABT contines, has dx sepsis, AKI, fevers, non anion gap metabolic acidosis, anemia.  PMHx:  DM, CKD4, HTN, diabetic foot ulcer, amp 1st and 2nd toes L foot,  Clinical Impression  Pt is assessed for mobility on RW with cues for safety and maintaining heel wb on LLE.  Pt is struggling with safety and balance, and has definite need for reminders to manage his transitions to stand.  Focus on LE strength, balance skills and quality of gait for PT to follow along with goals of acute PT.  Asking for SNF initially due to continual help needed for balance and safety of gait, and will work toward trying to go home as he progresses.  Maintain higher frequency until SNF is accepted.       Recommendations for follow up therapy are one component of a multi-disciplinary discharge planning process, led by the attending physician.  Recommendations may be updated based on patient status, additional functional criteria and insurance authorization.  Follow Up Recommendations Skilled nursing-short term rehab (<3 hours/day)    Assistance Recommended at Discharge Frequent or constant Supervision/Assistance  Patient can return home with the following  A little help with walking and/or transfers;A little help with bathing/dressing/bathroom;Assistance with cooking/housework;Help with stairs or ramp for entrance;Assist for transportation    Equipment Recommendations Rolling walker (2 wheels);Other (comment) (shower bench)  Recommendations for Other Services       Functional Status Assessment Patient has had a recent decline in their functional status and demonstrates the ability to make significant improvements in function in a  reasonable and predictable amount of time.     Precautions / Restrictions Precautions Precautions: Fall Precaution Comments: balance hindered by cam boot and heel wb Required Braces or Orthoses: Other Brace (cam boot) Restrictions Weight Bearing Restrictions: Yes LLE Weight Bearing: Partial weight bearing LLE Partial Weight Bearing Percentage or Pounds: heel wbing only in cam boot      Mobility  Bed Mobility Overal bed mobility: Needs Assistance Bed Mobility: Supine to Sit     Supine to sit: Min assist     General bed mobility comments: min assist to get LLE over to sde of bed and scoot up    Transfers Overall transfer level: Needs assistance Equipment used: Rolling walker (2 wheels);1 person hand held assist Transfers: Sit to/from Stand Sit to Stand: Min assist           General transfer comment: min assist all heights    Ambulation/Gait Ambulation/Gait assistance: Min assist Gait Distance (Feet): 35 Feet Assistive device: Rolling walker (2 wheels);1 person hand held assist Gait Pattern/deviations: Step-through pattern;Decreased stride length;Decreased weight shift to left;Drifts right/left;Wide base of support Gait velocity: reduced, variable   Pre-gait activities: balance training on cam boot General Gait Details: pt is having trouble balancing with heel wbing on LLE  Stairs            Wheelchair Mobility    Modified Rankin (Stroke Patients Only)       Balance Overall balance assessment: Needs assistance Sitting-balance support: Feet supported Sitting balance-Leahy Scale: Good     Standing balance support: Bilateral upper extremity supported;During functional activity Standing balance-Leahy Scale: Poor  Pertinent Vitals/Pain Pain Assessment: Faces Faces Pain Scale: Hurts a little bit Breathing: normal Pain Location: L foot Pain Descriptors / Indicators: Operative site guarding Pain  Intervention(s): Monitored during session;Limited activity within patient's tolerance;Repositioned    Home Living Family/patient expects to be discharged to:: Skilled nursing facility Living Arrangements: Spouse/significant other Available Help at Discharge: Family Type of Home: House Home Access: Stairs to enter Entrance Stairs-Rails: Can reach both Entrance Stairs-Number of Steps: 3   Home Layout: One level Home Equipment: Toilet riser      Prior Function Prior Level of Function : Needs assist       Physical Assist : Mobility (physical) Mobility (physical): Stairs   Mobility Comments: supervision on stairs       Hand Dominance   Dominant Hand: Right    Extremity/Trunk Assessment   Upper Extremity Assessment Upper Extremity Assessment: Overall WFL for tasks assessed    Lower Extremity Assessment Lower Extremity Assessment: Generalized weakness    Cervical / Trunk Assessment Cervical / Trunk Assessment: Kyphotic (mild)  Communication   Communication: No difficulties  Cognition Arousal/Alertness: Awake/alert Behavior During Therapy: WFL for tasks assessed/performed Overall Cognitive Status: No family/caregiver present to determine baseline cognitive functioning                                 General Comments: requires repeated safety instructions        General Comments General comments (skin integrity, edema, etc.): pt was seen for mobility on RW with cued balance skills, dense safety cues esp to turn and set up to sit.  Tends to want to back up and release walker    Exercises     Assessment/Plan    PT Assessment Patient needs continued PT services  PT Problem List Decreased strength;Decreased activity tolerance;Decreased balance;Decreased mobility;Decreased coordination;Decreased knowledge of use of DME;Decreased cognition;Decreased safety awareness;Decreased skin integrity       PT Treatment Interventions DME instruction;Gait  training;Functional mobility training;Therapeutic activities;Therapeutic exercise;Balance training;Neuromuscular re-education;Patient/family education    PT Goals (Current goals can be found in the Care Plan section)  Acute Rehab PT Goals Patient Stated Goal: to get home ASAP PT Goal Formulation: With patient Time For Goal Achievement: 10/27/21 Potential to Achieve Goals: Good    Frequency Min 3X/week     Co-evaluation               AM-PAC PT "6 Clicks" Mobility  Outcome Measure Help needed turning from your back to your side while in a flat bed without using bedrails?: A Little Help needed moving from lying on your back to sitting on the side of a flat bed without using bedrails?: A Little Help needed moving to and from a bed to a chair (including a wheelchair)?: A Little Help needed standing up from a chair using your arms (e.g., wheelchair or bedside chair)?: A Little Help needed to walk in hospital room?: A Little Help needed climbing 3-5 steps with a railing? : A Lot 6 Click Score: 17    End of Session Equipment Utilized During Treatment: Gait belt Activity Tolerance: Patient limited by fatigue;Treatment limited secondary to medical complications (Comment) Patient left: in chair;with call bell/phone within reach;with chair alarm set Nurse Communication: Mobility status;Other (comment) (pt up in chair for endurance and instruction for return to bed) PT Visit Diagnosis: Unsteadiness on feet (R26.81);Muscle weakness (generalized) (M62.81);Difficulty in walking, not elsewhere classified (R26.2)    Time: 4193-7902 PT Time Calculation (  min) (ACUTE ONLY): 24 min   Charges:   PT Evaluation $PT Eval Moderate Complexity: 1 Mod PT Treatments $Gait Training: 8-22 mins       Ramond Dial 10/13/2021, 7:59 PM  Mee Hives, PT PhD Acute Rehab Dept. Number: [place]

## 2021-10-13 NOTE — Progress Notes (Signed)
Subjective: [name] is a 71 y.o. male patient seen at bedside, resting comfortably in no acute distress s/p day #1 left I&D on 1/9 and 1st metatarsal resection on 1/11 performed by Dr. March Rummage. Patient denies pain at surgical site, denies calf pain, denies headache, chest pain, shortness of breath, nausea, vomitting, denies loss of appetite, denies problems with voiding.  But does admit to an episode of diarrhea.  No other issues noted.   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[place]) 10/10/2021   Acute kidney injury superimposed on CKD ([place]) 10/10/2021   Anemia in chronic kidney disease 05/27/2020   Secondary hyperparathyroidism of renal origin ([place]) 02/25/2020   Diabetic foot ulcer ([place]) 10/07/2019   Benign hypertensive kidney disease with chronic kidney disease 06/17/2019   Proteinuria 06/17/2019   History of amputation of lesser toe of left foot ([place]) 08/18/2015   Hyperlipidemia, mixed 08/18/2015   Type 2 diabetes mellitus with peripheral neuropathy ([place]) 08/18/2015   Acute CVA (cerebrovascular accident) ([place]) 08/09/2015   Essential hypertriglyceridemia 08/09/2015   Hypertension, essential 08/09/2015   Right-sided muscle weakness 08/09/2015   T2DM (type 2 diabetes mellitus) ([place]) 08/09/2015   Tobacco abuse 08/09/2015   CKD (chronic kidney disease) stage 4, GFR 15-29 ml/min ([place]) 04/17/2013   DM2 (diabetes mellitus, type 2) ([place]) 04/17/2013   HTN (hypertension) 04/17/2013   Osteomyelitis of toe of left foot ([place]) 04/17/2013     Current Facility-Administered Medications:    (feeding supplement) PROSource Plus liquid 30 mL, 30 mL, Oral, BID BM, Evelina Bucy, DPM, 30 mL at 10/13/21 1408   0.9 %  sodium chloride infusion, , Intravenous, Continuous, Price, Christian Mate, DPM, Stopped at 10/12/21 1015   acetaminophen (TYLENOL) tablet 650 mg, 650 mg, Oral, Q6H PRN, 650 mg at 10/12/21 2119 **OR** acetaminophen (TYLENOL)  suppository 650 mg, 650 mg, Rectal, Q6H PRN, Evelina Bucy, DPM   atorvastatin (LIPITOR) tablet 80 mg, 80 mg, Oral, Daily, Evelina Bucy, DPM, 80 mg at 10/13/21 0824   DAPTOmycin (CUBICIN) 750 mg in sodium chloride 0.9 % IVPB, 8 mg/kg, Intravenous, Q48H, Singh, Mayanka, MD   feeding supplement (NEPRO CARB STEADY) liquid 237 mL, 237 mL, Oral, BID BM, Evelina Bucy, DPM, 237 mL at 10/12/21 1303   heparin injection 5,000 Units, 5,000 Units, Subcutaneous, Q8H, Evelina Bucy, DPM, 5,000 Units at 10/13/21 1409   HYDROcodone-acetaminophen (NORCO/VICODIN) 5-325 MG per tablet 1 tablet, 1 tablet, Oral, Q6H PRN, Evelina Bucy, DPM, 1 tablet at 10/11/21 1821   HYDROmorphone (DILAUDID) injection 1 mg, 1 mg, Intravenous, Q4H PRN, Evelina Bucy, DPM, 1 mg at 10/13/21 0439   insulin aspart (novoLOG) injection 0-6 Units, 0-6 Units, Subcutaneous, TID WC, Hongalgi, Anand D, MD, 2 Units at 10/13/21 1600   multivitamin with minerals tablet 1 tablet, 1 tablet, Oral, Daily, Evelina Bucy, DPM, 1 tablet at 10/13/21 0824   ondansetron (ZOFRAN) tablet 4 mg, 4 mg, Oral, Q6H PRN **OR** ondansetron (ZOFRAN) injection 4 mg, 4 mg, Intravenous, Q6H PRN, Price, Christian Mate, DPM   piperacillin-tazobactam (ZOSYN) IVPB 2.25 g, 2.25 g, Intravenous, Q8H, Price, Michael J, DPM, Last Rate: 100 mL/hr at 10/13/21 1211, 2.25 g at 10/13/21 1211   pregabalin (LYRICA) capsule 75 mg, 75 mg, Oral, BID, Evelina Bucy, DPM, 75 mg at 10/13/21 6295   senna-docusate (Senokot-S) tablet 2 tablet, 2 tablet, Oral, Daily, Evelina Bucy, DPM, 2 tablet at 10/13/21 [phone]  sodium hypochlorite (DAKIN'S 1/4 STRENGTH) topical solution, , Irrigation, Daily, Evelina Bucy, DPM, Given at 10/13/21 [phone]  No Known Allergies   Objective: Today's Vitals   10/13/21 0509 10/13/21 0640 10/13/21 0720 10/13/21 0740  BP:  (!) 143/67  (!) 151/66  Pulse:  97  94  Resp:  16  18  Temp:  99.8 F (37.7 C)  98.9 F (37.2 C)  TempSrc:  Oral  Oral   SpO2:  99%  100%  Weight:      Height:      PainSc: Asleep  0-No pain     General: No acute distress  Left lower extremity: Dressing to left foot clean, dry, intact. No strikethrough noted, Upon removal of dressings sutures and staples intact with no dehiscence at metatarsal resection site. Decreased erythema, decreased edema, mild bloody drainage from left medial heel wound upon removal of the packing fatty tissue was noted no active purulence decreased erythema, minimal edema no tenderness to palpation to the heel.  No calf pain.    Assessment and Plan:  Problem List Items Addressed This Visit       Endocrine   Diabetic foot ulcer ([place])   Relevant Medications   atorvastatin (LIPITOR) tablet 80 mg   insulin aspart (novoLOG) injection 0-6 Units     Other   * (Principal) Sepsis ([place])   Other Visit Diagnoses     Other acute osteomyelitis of left foot ([place])    -  Primary   Relevant Medications   DAPTOmycin (CUBICIN) 750 mg in sodium chloride 0.9 % IVPB (Start on 10/13/2021  8:00 PM)   AKI (acute kidney injury) ([place])       Relevant Orders   US RENAL (Completed)       -Patient seen and evaluated at bedside -Chart reviewed -Dressing change preformed; applied dry dressing to left foot -Advised patient to make sure to keep dressing clean, dry, and intact to left foot allowing nursing to change dressings as ordered if there is strike through  -Awaiting intra-op cultures; continue with IV antibiotics per infectious disease recommendations -Limited weightbearing to heel with cam boot for bedside and bathroom only with assistive device -Continue with rest and elevation to assist with pain and edema control  -Podiatry will continue to follow closely  -Anticipated discharge plan of care: To home and likely 2 to 3 days with antibiotics after WBC downtrends. Patient will need home nursing or family to help with dressing changes of packing to the left heel every other day upon discharge.   Office will contact patient for follow up appointment after discharge.   [name], DPM [facility] [phone] office [phone] cell Available via secure chat

## 2021-10-13 NOTE — Care Management Important Message (Signed)
Important Message  Patient Details  Name: [name] MRN: [id number] Date of Birth: 11-Feb-1951   Medicare Important Message Given:  Yes     [name] 10/13/2021, 11:53 AM

## 2021-10-13 NOTE — Plan of Care (Signed)

## 2021-10-13 NOTE — TOC Initial Note (Signed)
Transition of Care [facility]) - Initial/Assessment Note    Patient Details  Name: [name] MRN: [id number] Date of Birth: 1951-05-30  Transition of Care [name]) CM/SW Contact:    Sharin Mons, RN Phone Number: 10/13/2021, 10:41 AM  Clinical Narrative:                 Admitted with Left foot Osteomyelitis and AKI.  - s/p L foot I&D on 10/10/2021  -S/p left foot wound I&D and left first     metatarsal resection 1/11  From home with spouse.PTA independent with ADL's. DME : cane. NCM spoke with pt regarding d/c planning. Pt states wife will assist and help with care once d/c. States has no problems affording Rx meds. Wife to provide transportation to home once d/c ready.  PCP: [facility]  PT evaluation pending....  TOC team monitoring and will assist with TOC needs.  Expected Discharge Plan: Home/Self Care Barriers to Discharge: Continued Medical Work up   Patient Goals and CMS Choice        Expected Discharge Plan and Services Expected Discharge Plan: Home/Self Care   Discharge Planning Services: CM Consult   Living arrangements for the past 2 months: Single Family Home                                      Prior Living Arrangements/Services Living arrangements for the past 2 months: Single Family Home Lives with:: Spouse Patient language and need for interpreter reviewed:: Yes Do you feel safe going back to the place where you live?: Yes      Need for Family Participation in Patient Care: Yes (Comment) Care giver support system in place?: Yes (comment) Current home services: DME (cane) Criminal Activity/Legal Involvement Pertinent to Current Situation/Hospitalization: No - Comment as needed  Activities of Daily Living Home Assistive Devices/Equipment: None ADL Screening (condition at time of admission) Patient's cognitive ability adequate to safely complete daily activities?: Yes Is the patient deaf or have difficulty hearing?:  No Does the patient have difficulty seeing, even when wearing glasses/contacts?: No Does the patient have difficulty concentrating, remembering, or making decisions?: No Patient able to express need for assistance with ADLs?: Yes Does the patient have difficulty dressing or bathing?: No Independently performs ADLs?: Yes (appropriate for developmental age) Does the patient have difficulty walking or climbing stairs?: Yes Weakness of Legs: Left Weakness of Arms/Hands: None  Permission Sought/Granted   Permission granted to share information with : Yes, Verbal Permission Granted  Share Information with NAME: Zahki Hoogendoorn (Spouse)   [phone]           Emotional Assessment Appearance:: Appears stated age Attitude/Demeanor/Rapport: Gracious Affect (typically observed): Pleasant Orientation: : Oriented to Self, Oriented to Place, Oriented to  Time, Oriented to Situation Alcohol / Substance Use: Not Applicable Psych Involvement: No (comment)  Admission diagnosis:  AKI (acute kidney injury) ([place]) [N17.9] Sepsis ([place]) [A41.9] Other acute osteomyelitis of left foot ([place]) [M86.172] Sepsis, due to unspecified organism, unspecified whether acute organ dysfunction present [facility]) [A41.9] Patient Active Problem List   Diagnosis Date Noted   Sepsis ([place]) 10/10/2021   Gas gangrene of foot ([place]) 10/10/2021   Acute kidney injury superimposed on CKD ([place]) 10/10/2021   Anemia in chronic kidney disease 05/27/2020   Secondary hyperparathyroidism of renal origin ([place]) 02/25/2020   Diabetic foot ulcer ([place]) 10/07/2019   Benign hypertensive kidney disease with chronic  kidney disease 06/17/2019   Proteinuria 06/17/2019   History of amputation of lesser toe of left foot ([place]) 08/18/2015   Hyperlipidemia, mixed 08/18/2015   Type 2 diabetes mellitus with peripheral neuropathy ([place]) 08/18/2015   Acute CVA (cerebrovascular accident) ([place]) 08/09/2015   Essential hypertriglyceridemia 08/09/2015    Hypertension, essential 08/09/2015   Right-sided muscle weakness 08/09/2015   T2DM (type 2 diabetes mellitus) ([place]) 08/09/2015   Tobacco abuse 08/09/2015   CKD (chronic kidney disease) stage 4, GFR 15-29 ml/min ([place]) 04/17/2013   DM2 (diabetes mellitus, type 2) ([place]) 04/17/2013   HTN (hypertension) 04/17/2013   Osteomyelitis of toe of left foot ([place]) 04/17/2013   PCP:  [facility]:   [place], [place] - [address] [place] [address] [place] [address] [place] [address] Phone: [phone] Fax: [phone]  [place], [place] - [address] [place] [address] Phone: [phone] Fax: [phone]     Social Determinants of Health (SDOH) Interventions    Readmission Risk Interventions No flowsheet data found.

## 2021-10-13 NOTE — Consult Note (Signed)
[facility]    Date of Admission:  10/10/2021     Total days of antibiotics 5               Reason for Consult: Sepsis / Osteomyelitis    Referring Provider: Dr. Algis Liming Primary Care Provider: [facility]   ASSESSMENT:  [name] is the 71 y/o male with Type 2 diabetes and admitted for infected diabetic foot ulceration and found to have osteomyelitis now s/p I&D x 2 and 1st metatarsal resection.  Cultures have grown Streptococcus anginosis, Proteus mirabilis, and Prevotella bivia. Office wound culture with Citrobacter. POD #1 and continues to have waxing and waning fevers despite linezolid and pip/tazo. Suspect source control remains an issue as per primary team note there is potential for additional purulence after speaking with Podiatry. Will change antibiotics to daptomycin and pip/tazo with narrowing pending susceptibility results. Will need prolonged course of antibiotics given continued infection. Post-operative wound care and additional surgical recommendations per Podiatry. Check Hepatitis C antibody for routine health care screening. Remaining medical and supportive care per primary team.   PLAN:  Continue Pip/tazo and change Zyvox to daptomycin. Post-operative wound care per Podiatry. Monitor cultures for sensitivities.  Monitor CK levels while on Daptomycin. Hepatitis C antibody for routine health care maintenance.    Principal Problem:   Sepsis ([place]) Active Problems:   CKD (chronic kidney disease) stage 4, GFR 15-29 ml/min (HCC)   History of amputation of lesser toe of left foot (HCC)   HTN (hypertension)   Osteomyelitis of toe of left foot (HCC)   Diabetic foot ulcer (HCC)   Gas gangrene of foot (HCC)   Acute kidney injury superimposed on CKD (HCC)    (feeding supplement) PROSource Plus  30 mL Oral BID BM   atorvastatin  80 mg Oral Daily   feeding supplement (NEPRO CARB STEADY)  237 mL Oral BID BM    heparin injection (subcutaneous)  5,000 Units Subcutaneous Q8H   insulin aspart  0-6 Units Subcutaneous TID WC   multivitamin with minerals  1 tablet Oral Daily   pregabalin  75 mg Oral BID   senna-docusate  2 tablet Oral Daily   sodium hypochlorite   Irrigation Daily     HPI: [name] is a 71 y.o. male with previous medical history as detailed below and significant for Type 2 diabetes complicated by diabetic food ulcer s/p amputation of the 1st and 2nd toes on the left foot presenting with ongoing ulceration of the metatarsal head of the left foot.  [name] has been in care for left foot ulceration with Podiatry and was last seen on 10/06/21 with worsening pain, redness, and bleeding. Wound was noted to have an odor during dressing change. He was started on Bactrim while awaiting MRI with concern for acute osteomyelitis. Admitted on 1/9 with MRI of the left foot with soft tissue wound along the distal plantar medical aspect of the first TMT joint with sinus tract extending into the soft tissues with focal inflammation concerning for phlegmon; and severe bone marrow edema in the first metatarsal head and sesamoid concerning for osteomyelitis.   Taken on 10/10/21 for incision and drainage below the deep fascia of the left foot encountering purulent drainage which was cultured. Heel was noted to have bogginess and found to have necrotic tissue without purulence and did not communicate to the medial wound. On 10/12/21 returned to OR for left foot wound debridement and  1st metatarsal resection.   [name] has had waxing and waning fevers since admission most recently 102.7 F in the last 24 hours with steady leukocytosis with WBC count of 22.5. Currently on Day 5 of antimicrobial therapy with Linezolid and Pip-tazo. Wound culture from 10/06/21 is growing Citerobacter. Blood cultures from 10/10/21 are without growth to date. Surgical specimens with no growth, multiple organisms present, and the third  specimen with Streptococcus anginosis, Prevotella and Proteus mirabilis.  Currently with minimal    Review of Systems: Review of Systems  Constitutional:  Negative for chills, fever and weight loss.  Respiratory:  Negative for cough, shortness of breath and wheezing.   Cardiovascular:  Negative for chest pain and leg swelling.  Gastrointestinal:  Negative for abdominal pain, constipation, diarrhea, nausea and vomiting.  Skin:  Negative for rash.    Past Medical History:  Diagnosis Date   Chronic kidney disease    stage 4-Cr 3.3 11-19-19   Diabetes mellitus without complication ([place])    Diabetic ulcer of foot associated with diabetes mellitus due to underlying condition, limited to breakdown of skin ([place])    left   GERD (gastroesophageal reflux disease)    Hyperlipidemia    Hypertension    Non compliance w medication regimen    Stroke ([place]) 2016   no deficits    Social History   Tobacco Use   Smoking status: Former   Smokeless tobacco: Never  Substance Use Topics   Alcohol use: Not Currently   Drug use: Never    History reviewed. No pertinent family history.  No Known Allergies  OBJECTIVE: Blood pressure (!) 151/66, pulse 94, temperature 98.9 F (37.2 C), temperature source Oral, resp. rate 18, height 6' (1.829 m), weight 94.3 kg, SpO2 100 %.  Physical Exam Constitutional:      General: He is not in acute distress.    Appearance: He is well-developed.     Comments: Seated in the chair, pleasant.   Cardiovascular:     Rate and Rhythm: Normal rate and regular rhythm.     Heart sounds: Normal heart sounds.  Pulmonary:     Effort: Pulmonary effort is normal.     Breath sounds: Normal breath sounds.  Musculoskeletal:     Comments: Surgical dressing in place.   Skin:    General: Skin is warm and dry.  Neurological:     Mental Status: He is alert and oriented to person, place, and time.  Psychiatric:        Behavior: Behavior normal.        Thought Content:  Thought content normal.        Judgment: Judgment normal.    Lab Results Lab Results  Component Value Date   WBC 22.5 (H) 10/13/2021   HGB 8.1 (L) 10/13/2021   HCT 25.1 (L) 10/13/2021   MCV 87.5 10/13/2021   PLT 304 10/13/2021    Lab Results  Component Value Date   CREATININE 4.84 (H) 10/13/2021   BUN 40 (H) 10/13/2021   NA 131 (L) 10/13/2021   K 4.7 10/13/2021   CL 106 10/13/2021   CO2 18 (L) 10/13/2021    Lab Results  Component Value Date   ALT 55 (H) 10/13/2021   AST 69 (H) 10/13/2021   ALKPHOS 128 (H) 10/13/2021   BILITOT 0.5 10/13/2021     Microbiology: Recent Results (from the past 240 hour(s))  WOUND CULTURE     Status: Abnormal   Collection Time: 10/06/21  5:02 PM  Specimen: Wound  Result Value Ref Range Status   MICRO NUMBER: [id number]  Final   SPECIMEN QUALITY: Adequate  Final   SOURCE: WOUND (SITE NOT SPECIFIED)  Final   STATUS: FINAL  Final   GRAM STAIN:   Final    Few Polymorphonuclear leukocytes No epithelial cells seen Few Gram positive cocci in pairs Few Gram negative bacilli   ISOLATE 1: Citrobacter koseri (A)  Final    Comment: Heavy growth of Citrobacter koseri      Susceptibility   Citrobacter koseri - AEROBIC CULT, GRAM STAIN NEGATIVE 1    AMOX/CLAVULANIC <=2 Sensitive     CEFAZOLIN* <=4 Not Reportable      * For infections other than uncomplicated UTI caused by E. coli, K. pneumoniae or P. mirabilis: Cefazolin is resistant if MIC > or = 8 mcg/mL. (Distinguishing susceptible versus intermediate for isolates with MIC < or = 4 mcg/mL requires additional testing.)     CEFTAZIDIME <=1 Sensitive     CEFEPIME <=1 Sensitive     CEFTRIAXONE <=1 Sensitive     CIPROFLOXACIN <=0.25 Sensitive     LEVOFLOXACIN <=0.12 Sensitive     GENTAMICIN <=1 Sensitive     IMIPENEM 1 Sensitive     PIP/TAZO <=4 Sensitive     TOBRAMYCIN <=1 Sensitive     TRIMETH/SULFA* <=20 Sensitive      * For infections other than uncomplicated UTI caused by E. coli, K.  pneumoniae or P. mirabilis: Cefazolin is resistant if MIC > or = 8 mcg/mL. (Distinguishing susceptible versus intermediate for isolates with MIC < or = 4 mcg/mL requires additional testing.) Legend: S = Susceptible  I = Intermediate R = Resistant  NS = Not susceptible * = Not tested  NR = Not reported **NN = See antimicrobic comments   Aerobic/Anaerobic Culture w Gram Stain (surgical/deep wound)     Status: None (Preliminary result)   Collection Time: 10/10/21 12:20 AM   Specimen: PATH Other; Tissue  Result Value Ref Range Status   Specimen Description WOUND  Final   Special Requests FIRST METATARSAL BONE  Final   Gram Stain NO WBC SEEN NO ORGANISMS SEEN   Final   Culture   Final    NO GROWTH < 24 HOURS Performed at [facility], [place] [address] [place], [place] [address]    Report Status PENDING  Incomplete  Resp Panel by RT-PCR (Flu A&B, Covid) Nasopharyngeal Swab     Status: None   Collection Time: 10/10/21 12:28 AM   Specimen: Nasopharyngeal Swab; Nasopharyngeal(NP) swabs in vial transport medium  Result Value Ref Range Status   SARS Coronavirus 2 by RT PCR NEGATIVE NEGATIVE Final    Comment: (NOTE) SARS-CoV-2 target nucleic acids are NOT DETECTED.  The SARS-CoV-2 RNA is generally detectable in upper respiratory specimens during the acute phase of infection. The lowest concentration of SARS-CoV-2 viral copies this assay can detect is 138 copies/mL. A negative result does not preclude SARS-Cov-2 infection and should not be used as the sole basis for treatment or other patient management decisions. A negative result may occur with  improper specimen collection/handling, submission of specimen other than nasopharyngeal swab, presence of viral mutation(s) within the areas targeted by this assay, and inadequate number of viral copies(<138 copies/mL). A negative result must be combined with clinical observations, patient history, and epidemiological information. The  expected result is Negative.  Fact Sheet for Patients:  EntrepreneurPulse.com.au  Fact Sheet for Healthcare Providers:  IncredibleEmployment.be  This test is  no t yet approved or cleared by the [place] and  has been authorized for detection and/or diagnosis of SARS-CoV-2 by FDA under an Emergency Use Authorization (EUA). This EUA will remain  in effect (meaning this test can be used) for the duration of the COVID-19 declaration under Section 564(b)(1) of the Act, 21 U.S.C.section 360bbb-3(b)(1), unless the authorization is terminated  or revoked sooner.       Influenza A by PCR NEGATIVE NEGATIVE Final   Influenza B by PCR NEGATIVE NEGATIVE Final    Comment: (NOTE) The Xpert Xpress SARS-CoV-2/FLU/RSV plus assay is intended as an aid in the diagnosis of influenza from Nasopharyngeal swab specimens and should not be used as a sole basis for treatment. Nasal washings and aspirates are unacceptable for Xpert Xpress SARS-CoV-2/FLU/RSV testing.  Fact Sheet for Patients: EntrepreneurPulse.com.au  Fact Sheet for Healthcare Providers: IncredibleEmployment.be  This test is not yet approved or cleared by the [place] FDA and has been authorized for detection and/or diagnosis of SARS-CoV-2 by FDA under an Emergency Use Authorization (EUA). This EUA will remain in effect (meaning this test can be used) for the duration of the COVID-19 declaration under Section 564(b)(1) of the Act, 21 U.S.C. section 360bbb-3(b)(1), unless the authorization is terminated or revoked.  Performed at [facility], [place] [address], [place], [place] [address]   Blood culture (routine x 2)     Status: None (Preliminary result)   Collection Time: 10/10/21  1:00 AM   Specimen: BLOOD  Result Value Ref Range Status   Specimen Description BLOOD LEFT ARM  Final   Special Requests   Final    BOTTLES DRAWN AEROBIC AND  ANAEROBIC Blood Culture adequate volume   Culture   Final    NO GROWTH 3 DAYS Performed at [facility], [place] [address], [place], [place] [address]    Report Status PENDING  Incomplete  Blood culture (routine x 2)     Status: None (Preliminary result)   Collection Time: 10/10/21  1:05 AM   Specimen: BLOOD  Result Value Ref Range Status   Specimen Description BLOOD RIGHT ARM  Final   Special Requests   Final    BOTTLES DRAWN AEROBIC AND ANAEROBIC Blood Culture results may not be optimal due to an inadequate volume of blood received in culture bottles   Culture   Final    NO GROWTH 3 DAYS Performed at [facility], [place] [address] [place], [place] [address]    Report Status PENDING  Incomplete  Aerobic Culture w Gram Stain (superficial specimen)     Status: None   Collection Time: 10/10/21  6:24 AM   Specimen: Wound  Result Value Ref Range Status   Specimen Description WOUND  Final   Special Requests SINOSP  Final   Gram Stain   Final    ABUNDANT WBC PRESENT, PREDOMINANTLY PMN MODERATE GRAM POSITIVE COCCI    Culture   Final    RARE MULTIPLE ORGANISMS PRESENT, NONE PREDOMINANT NO GROUP A STREP (S.PYOGENES) ISOLATED NO STAPHYLOCOCCUS AUREUS ISOLATED Performed at [facility], [place] [address] [name], [place] [address]    Report Status 10/12/2021 FINAL  Final  Aerobic/Anaerobic Culture w Gram Stain (surgical/deep wound)     Status: None (Preliminary result)   Collection Time: 10/10/21  9:49 PM   Specimen: Abscess; Wound  Result Value Ref Range Status   Specimen Description ABSCESS LEFT FOOT  Final   Special Requests NONE  Final  Gram Stain   Final    FEW WBC PRESENT, PREDOMINANTLY PMN FEW GRAM POSITIVE COCCI IN CHAINS    Culture   Final    FEW STREPTOCOCCUS ANGINOSIS FEW PROTEUS MIRABILIS SUSCEPTIBILITIES TO FOLLOW CULTURE REINCUBATED FOR BETTER GROWTH Performed at [facility], [place] [address], [place], [name] [address]    Report Status PENDING   Incomplete  MRSA Next Gen by PCR, Nasal     Status: None   Collection Time: 10/11/21  5:54 AM   Specimen: Nasal Mucosa; Nasal Swab  Result Value Ref Range Status   MRSA by PCR Next Gen NOT DETECTED NOT DETECTED Final    Comment: (NOTE) The GeneXpert MRSA Assay (FDA approved for NASAL specimens only), is one component of a comprehensive MRSA colonization surveillance program. It is not intended to diagnose MRSA infection nor to guide or monitor treatment for MRSA infections. Test performance is not FDA approved in patients less than 27 years old. Performed at [facility], [place] [address], [place], [place] [address]      Terri Piedra, [facility]  10/13/2021  2:31 PM

## 2021-10-13 NOTE — Progress Notes (Signed)
Pharmacy Antibiotic Note  [name] is a 71 y.o. male admitted on 10/10/2021 with diabetic foot infection.  Pharmacy has been consulted for Zosyn dosing.  Today is abx day #4.  Pt is now s/p I&D (x2) and 1st metatarsal resection by podiatry.  Pt continues to fever, Tm 102.7 (1/11).  WBC elevated, SCr elevated 4.84 likely a reflection of outpatient Bactrim and AKI.  Nephrology following as well.   Plan: -Continue Zosyn 2.25 gm IV Q 8 hours  -Linezolid 600mg  IV q12h  -Monitor renal fx, cultures and clinical progress  Height: 6' (182.9 cm) Weight: 94.3 kg (208 lb) IBW/kg (Calculated) : 77.6  Temp (24hrs), Avg:99.6 F (37.6 C), Min:98.5 F (36.9 C), Max:102.7 F (39.3 C)  Recent Labs  Lab 10/10/21 0028 10/11/21 0340 10/12/21 0219 10/13/21 0349  WBC 24.7* 23.3* 23.3* 22.5*  CREATININE 5.25* 4.73* 4.61* 4.84*  LATICACIDVEN 1.7  --   --   --      Estimated Creatinine Clearance: 17.4 mL/min (A) (by C-G formula based on SCr of 4.84 mg/dL (H)).    No Known Allergies  Antimicrobials this admission: Linezolid 1/9 >> Zosyn 1/9 >>   Dose adjustments this admission:   Microbiology results: PTA wound cx 1/5: Citrobacter: sens Augmentin, LVQ, Bactrim, Cefepime, Ceftaz, Cipro, Zosyn, Tobra (MIC 1)  1/9 blood: ng x 2 days to date  1/9 left foot abscess: few strep anginosis, few proteus mirabilis  1/9 wound (aerobic): rare mult org, none predom F  1/9 COVID and flu: neg  1/10 MRSA PCR: neg   Thank you for allowing pharmacy to be a part of this patients care.  [employer], Pharm.D., BCPS Clinical Pharmacist Clinical phone for 10/13/2021 from 7:30-3:00 is [phone].  **Pharmacist phone directory can be found on [email or web address] listed under [place].  10/13/2021 10:54 AM

## 2021-10-14 ENCOUNTER — Inpatient Hospital Stay (HOSPITAL_COMMUNITY): Payer: Medicare HMO

## 2021-10-14 DIAGNOSIS — A419 Sepsis, unspecified organism: Secondary | ICD-10-CM | POA: Diagnosis not present

## 2021-10-14 DIAGNOSIS — N179 Acute kidney failure, unspecified: Secondary | ICD-10-CM | POA: Diagnosis not present

## 2021-10-14 DIAGNOSIS — N189 Chronic kidney disease, unspecified: Secondary | ICD-10-CM | POA: Diagnosis not present

## 2021-10-14 DIAGNOSIS — R6520 Severe sepsis without septic shock: Secondary | ICD-10-CM | POA: Diagnosis not present

## 2021-10-14 LAB — CBC
HCT: 28.2 % — ABNORMAL LOW (ref 39.0–52.0)
Hemoglobin: 9 g/dL — ABNORMAL LOW (ref 13.0–17.0)
MCH: 28.3 pg (ref 26.0–34.0)
MCHC: 31.9 g/dL (ref 30.0–36.0)
MCV: 88.7 fL (ref 80.0–100.0)
Platelets: 358 10*3/uL (ref 150–400)
RBC: 3.18 MIL/uL — ABNORMAL LOW (ref 4.22–5.81)
RDW: 14.3 % (ref 11.5–15.5)
WBC: 18.1 10*3/uL — ABNORMAL HIGH (ref 4.0–10.5)
nRBC: 0 % (ref 0.0–0.2)

## 2021-10-14 LAB — BASIC METABOLIC PANEL
Anion gap: 4 — ABNORMAL LOW (ref 5–15)
BUN: 40 mg/dL — ABNORMAL HIGH (ref 8–23)
CO2: 20 mmol/L — ABNORMAL LOW (ref 22–32)
Calcium: 7.3 mg/dL — ABNORMAL LOW (ref 8.9–10.3)
Chloride: 109 mmol/L (ref 98–111)
Creatinine, Ser: 4.52 mg/dL — ABNORMAL HIGH (ref 0.61–1.24)
GFR, Estimated: 13 mL/min — ABNORMAL LOW (ref >60)
Glucose, Bld: 225 mg/dL — ABNORMAL HIGH (ref 70–99)
Potassium: 4.4 mmol/L (ref 3.5–5.1)
Sodium: 133 mmol/L — ABNORMAL LOW (ref 135–145)

## 2021-10-14 LAB — GLUCOSE, CAPILLARY
Glucose-Capillary: 209 mg/dL — ABNORMAL HIGH (ref 70–99)
Glucose-Capillary: 215 mg/dL — ABNORMAL HIGH (ref 70–99)
Glucose-Capillary: 280 mg/dL — ABNORMAL HIGH (ref 70–99)
Glucose-Capillary: 243 mg/dL — ABNORMAL HIGH (ref 70–99)

## 2021-10-14 LAB — SURGICAL PATHOLOGY

## 2021-10-14 LAB — CK: Total CK: 53 U/L (ref 49–397)

## 2021-10-14 LAB — HEPATITIS C ANTIBODY: HCV Ab: NONREACTIVE

## 2021-10-14 MED ORDER — INSULIN ASPART 100 UNIT/ML IJ SOLN: 0.0000 [IU] | Freq: Three times a day (TID) | INTRAMUSCULAR | Status: DC

## 2021-10-14 MED ORDER — INSULIN ASPART 100 UNIT/ML IJ SOLN
0.0000 [IU] | Freq: Three times a day (TID) | INTRAMUSCULAR | Status: DC
  Administered 2021-10-14 – 2021-10-15 (×2): 5 [IU] via SUBCUTANEOUS
  Administered 2021-10-15: 2 [IU] via SUBCUTANEOUS
  Administered 2021-10-15: 3 [IU] via SUBCUTANEOUS

## 2021-10-14 NOTE — Progress Notes (Signed)
[facility] for Infectious Disease  Date of Admission:  10/10/2021     Total days of antibiotics 6         ASSESSMENT:  [name] appears improved today with mild decrease in leukocytosis. Awaiting sensitivity of surgical specimens with polymicrobial findings and will continue with broad coverage with daptomycin and pip/tazo. Have concern for worsening infection and need for potential further surgical intervention. Dr. Candiss Norse spoke with Dr. Cannon Kettle and MRI of left foot has been ordered for further evaluation. Discussed with [name] and wife at bedside the potential need for additional surgical intervention which will be deferred to Dr. Cannon Kettle and Podiatry team. Will need prolonged course of antibiotics going forward with duration to be determined. Remaining medical and supportive care per primary team.   PLAN:  Continue Daptomycin and pip/tazo.  Monitor surgical specimens for sensitivities  MRI left foot  Post-operative wound care and additional surgical evaluation per Podiatry.  Remaining medical and supportive care per primary team.     Principal Problem:   Sepsis ([place]) Active Problems:   CKD (chronic kidney disease) stage 4, GFR 15-29 ml/min (HCC)   History of amputation of lesser toe of left foot (HCC)   HTN (hypertension)   Osteomyelitis of toe of left foot (HCC)   Diabetic foot ulcer ([place])   Gas gangrene of foot (HCC)   Acute kidney injury superimposed on CKD (HCC)    (feeding supplement) PROSource Plus  30 mL Oral BID BM   atorvastatin  80 mg Oral Daily   feeding supplement (NEPRO CARB STEADY)  237 mL Oral BID BM   heparin injection (subcutaneous)  5,000 Units Subcutaneous Q8H   insulin aspart  0-6 Units Subcutaneous TID WC   multivitamin with minerals  1 tablet Oral Daily   pregabalin  75 mg Oral BID   senna-docusate  2 tablet Oral Daily    SUBJECTIVE:  Afebrile overnight with no acute events. No new concerns/complaints.   No Known  Allergies   Review of Systems: Review of Systems  Constitutional:  Negative for chills, fever and weight loss.  Respiratory:  Negative for cough, shortness of breath and wheezing.   Cardiovascular:  Negative for chest pain and leg swelling.  Gastrointestinal:  Negative for abdominal pain, constipation, diarrhea, nausea and vomiting.  Skin:  Negative for rash.     OBJECTIVE: Vitals:   10/13/21 0640 10/13/21 0740 10/13/21 2045 10/14/21 0749  BP: (!) 143/67 (!) 151/66 (!) 172/80 (!) 165/77  Pulse: 97 94 95 97  Resp: 16 18  18   Temp: 99.8 F (37.7 C) 98.9 F (37.2 C) 100 F (37.8 C) 98.4 F (36.9 C)  TempSrc: Oral Oral Oral Oral  SpO2: 99% 100% 100% 100%  Weight:      Height:       Body mass index is 28.21 kg/m.  Physical Exam Constitutional:      General: He is not in acute distress.    Appearance: He is well-developed.  Cardiovascular:     Rate and Rhythm: Normal rate and regular rhythm.     Heart sounds: Normal heart sounds.  Pulmonary:     Effort: Pulmonary effort is normal.     Breath sounds: Normal breath sounds.  Musculoskeletal:     Comments: Surgical sites appear with increased redness and some necrosis.  Skin:    General: Skin is warm and dry.  Neurological:     Mental Status: He is alert and oriented to person, place, and time.  Psychiatric:        Mood and Affect: Mood normal.    Lab Results Lab Results  Component Value Date   WBC 18.1 (H) 10/14/2021   HGB 9.0 (L) 10/14/2021   HCT 28.2 (L) 10/14/2021   MCV 88.7 10/14/2021   PLT 358 10/14/2021    Lab Results  Component Value Date   CREATININE 4.52 (H) 10/14/2021   BUN 40 (H) 10/14/2021   NA 133 (L) 10/14/2021   K 4.4 10/14/2021   CL 109 10/14/2021   CO2 20 (L) 10/14/2021    Lab Results  Component Value Date   ALT 55 (H) 10/13/2021   AST 69 (H) 10/13/2021   ALKPHOS 128 (H) 10/13/2021   BILITOT 0.5 10/13/2021     Microbiology: Recent Results (from the past 240 hour(s))  WOUND  CULTURE     Status: Abnormal   Collection Time: 10/06/21  5:02 PM   Specimen: Wound  Result Value Ref Range Status   MICRO NUMBER: [id number]  Final   SPECIMEN QUALITY: Adequate  Final   SOURCE: WOUND (SITE NOT SPECIFIED)  Final   STATUS: FINAL  Final   GRAM STAIN:   Final    Few Polymorphonuclear leukocytes No epithelial cells seen Few Gram positive cocci in pairs Few Gram negative bacilli   ISOLATE 1: Citrobacter koseri (A)  Final    Comment: Heavy growth of Citrobacter koseri      Susceptibility   Citrobacter koseri - AEROBIC CULT, GRAM STAIN NEGATIVE 1    AMOX/CLAVULANIC <=2 Sensitive     CEFAZOLIN* <=4 Not Reportable      * For infections other than uncomplicated UTI caused by E. coli, K. pneumoniae or P. mirabilis: Cefazolin is resistant if MIC > or = 8 mcg/mL. (Distinguishing susceptible versus intermediate for isolates with MIC < or = 4 mcg/mL requires additional testing.)     CEFTAZIDIME <=1 Sensitive     CEFEPIME <=1 Sensitive     CEFTRIAXONE <=1 Sensitive     CIPROFLOXACIN <=0.25 Sensitive     LEVOFLOXACIN <=0.12 Sensitive     GENTAMICIN <=1 Sensitive     IMIPENEM 1 Sensitive     PIP/TAZO <=4 Sensitive     TOBRAMYCIN <=1 Sensitive     TRIMETH/SULFA* <=20 Sensitive      * For infections other than uncomplicated UTI caused by E. coli, K. pneumoniae or P. mirabilis: Cefazolin is resistant if MIC > or = 8 mcg/mL. (Distinguishing susceptible versus intermediate for isolates with MIC < or = 4 mcg/mL requires additional testing.) Legend: S = Susceptible  I = Intermediate R = Resistant  NS = Not susceptible * = Not tested  NR = Not reported **NN = See antimicrobic comments   Aerobic/Anaerobic Culture w Gram Stain (surgical/deep wound)     Status: None (Preliminary result)   Collection Time: 10/10/21 12:20 AM   Specimen: PATH Other; Tissue  Result Value Ref Range Status   Specimen Description WOUND  Final   Special Requests FIRST METATARSAL BONE  Final   Gram Stain  NO WBC SEEN NO ORGANISMS SEEN   Final   Culture   Final    NO GROWTH < 24 HOURS Performed at [facility], [place] [address], [place], [place] [address]    Report Status PENDING  Incomplete  Resp Panel by RT-PCR (Flu A&B, Covid) Nasopharyngeal Swab     Status: None   Collection Time: 10/10/21 12:28 AM   Specimen: Nasopharyngeal Swab; Nasopharyngeal(NP) swabs in vial transport  medium  Result Value Ref Range Status   SARS Coronavirus 2 by RT PCR NEGATIVE NEGATIVE Final    Comment: (NOTE) SARS-CoV-2 target nucleic acids are NOT DETECTED.  The SARS-CoV-2 RNA is generally detectable in upper respiratory specimens during the acute phase of infection. The lowest concentration of SARS-CoV-2 viral copies this assay can detect is 138 copies/mL. A negative result does not preclude SARS-Cov-2 infection and should not be used as the sole basis for treatment or other patient management decisions. A negative result may occur with  improper specimen collection/handling, submission of specimen other than nasopharyngeal swab, presence of viral mutation(s) within the areas targeted by this assay, and inadequate number of viral copies(<138 copies/mL). A negative result must be combined with clinical observations, patient history, and epidemiological information. The expected result is Negative.  Fact Sheet for Patients:  EntrepreneurPulse.com.au  Fact Sheet for Healthcare Providers:  IncredibleEmployment.be  This test is no t yet approved or cleared by the [place] FDA and  has been authorized for detection and/or diagnosis of SARS-CoV-2 by FDA under an Emergency Use Authorization (EUA). This EUA will remain  in effect (meaning this test can be used) for the duration of the COVID-19 declaration under Section 564(b)(1) of the Act, 21 U.S.C.section 360bbb-3(b)(1), unless the authorization is terminated  or revoked sooner.       Influenza A by PCR  NEGATIVE NEGATIVE Final   Influenza B by PCR NEGATIVE NEGATIVE Final    Comment: (NOTE) The Xpert Xpress SARS-CoV-2/FLU/RSV plus assay is intended as an aid in the diagnosis of influenza from Nasopharyngeal swab specimens and should not be used as a sole basis for treatment. Nasal washings and aspirates are unacceptable for Xpert Xpress SARS-CoV-2/FLU/RSV testing.  Fact Sheet for Patients: EntrepreneurPulse.com.au  Fact Sheet for Healthcare Providers: IncredibleEmployment.be  This test is not yet approved or cleared by the [place] FDA and has been authorized for detection and/or diagnosis of SARS-CoV-2 by FDA under an Emergency Use Authorization (EUA). This EUA will remain in effect (meaning this test can be used) for the duration of the COVID-19 declaration under Section 564(b)(1) of the Act, 21 U.S.C. section 360bbb-3(b)(1), unless the authorization is terminated or revoked.  Performed at [facility], [place] [address], [place], [place] [address]   Blood culture (routine x 2)     Status: None (Preliminary result)   Collection Time: 10/10/21  1:00 AM   Specimen: BLOOD  Result Value Ref Range Status   Specimen Description BLOOD LEFT ARM  Final   Special Requests   Final    BOTTLES DRAWN AEROBIC AND ANAEROBIC Blood Culture adequate volume   Culture   Final    NO GROWTH 4 DAYS Performed at [facility], [place] [address], [place], [place] [address]    Report Status PENDING  Incomplete  Blood culture (routine x 2)     Status: None (Preliminary result)   Collection Time: 10/10/21  1:05 AM   Specimen: BLOOD  Result Value Ref Range Status   Specimen Description BLOOD RIGHT ARM  Final   Special Requests   Final    BOTTLES DRAWN AEROBIC AND ANAEROBIC Blood Culture results may not be optimal due to an inadequate volume of blood received in culture bottles   Culture   Final    NO GROWTH 4 DAYS Performed at [facility],  [place] [address] [place], [place] [address]    Report Status PENDING  Incomplete  Aerobic Culture w Gram  Stain (superficial specimen)     Status: None   Collection Time: 10/10/21  6:24 AM   Specimen: Wound  Result Value Ref Range Status   Specimen Description WOUND  Final   Special Requests SINOSP  Final   Gram Stain   Final    ABUNDANT WBC PRESENT, PREDOMINANTLY PMN MODERATE GRAM POSITIVE COCCI    Culture   Final    RARE MULTIPLE ORGANISMS PRESENT, NONE PREDOMINANT NO GROUP A STREP (S.PYOGENES) ISOLATED NO STAPHYLOCOCCUS AUREUS ISOLATED Performed at [facility], [address] [place], [place] [address]    Report Status 10/12/2021 FINAL  Final  Aerobic/Anaerobic Culture w Gram Stain (surgical/deep wound)     Status: None (Preliminary result)   Collection Time: 10/10/21  9:49 PM   Specimen: Abscess; Wound  Result Value Ref Range Status   Specimen Description ABSCESS LEFT FOOT  Final   Special Requests NONE  Final   Gram Stain   Final    FEW WBC PRESENT, PREDOMINANTLY PMN FEW GRAM POSITIVE COCCI IN CHAINS    Culture   Final    FEW STREPTOCOCCUS ANGINOSIS FEW PROTEUS MIRABILIS SUSCEPTIBILITIES TO FOLLOW FEW PREVOTELLA BIVIA BETA LACTAMASE POSITIVE Performed at [facility], [address], [place], [place] [address]    Report Status PENDING  Incomplete  MRSA Next Gen by PCR, Nasal     Status: None   Collection Time: 10/11/21  5:54 AM   Specimen: Nasal Mucosa; Nasal Swab  Result Value Ref Range Status   MRSA by PCR Next Gen NOT DETECTED NOT DETECTED Final    Comment: (NOTE) The GeneXpert MRSA Assay (FDA approved for NASAL specimens only), is one component of a comprehensive MRSA colonization surveillance program. It is not intended to diagnose MRSA infection nor to guide or monitor treatment for MRSA infections. Test performance is not FDA approved in patients less than 3 years old. Performed at [facility], [place] [address] [place], [place] [address]       Terri Piedra, [facility] Infectious [facility]  10/14/2021  11:59 AM

## 2021-10-14 NOTE — Plan of Care (Signed)

## 2021-10-14 NOTE — Progress Notes (Signed)
PROGRESS NOTE  [name] VOZ:[id number] DOB: 03-24-1951 DOA: 10/10/2021 PCP: [facility]  HPI/Recap of past 24 hours: [name] is a 71 y.o. male with medical history significant of DM2, CKD4, HTN.  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  Who presented to [facility] ED from home with ongoing ulceration of ball area of L foot and worsening renal function.  Followed by podiatry, with concern for osteomyelitis.  Admitted for left foot gas gangrene and AKI on CKD 4.  Post I&D on 10/10/2021 by podiatry Dr March Rummage.  Patient also seen by nephrology, Dr. Hollie Salk.  S/p left foot wound I&D and left first metatarsal resection 1/11.  Continues to spike high fevers despite broad-spectrum IV antibiotics, infectious disease consulted for assistance on 1/12.  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  ID discontinued linezolid and started daptomycin and 1/12. Blood cultures x2: Negative to date. Wound culture 1/9: No group A strep or staph aureus isolated. Left foot abscess culture 1/9: Few Streptococcus anginosus and few Proteus mirabilis, cultures reintubated. Pain is controlled. Rest of management per podiatry.  Communicated with Dr. March Rummage, reports that during procedure yesterday, had more pus in his heel and will monitor closely and if continues to spike fevers, may need further I&D. Ongoing fever spikes up to 102.7 overnight.  Infectious disease  consultation appreciated.  MRI of left foot revealed postsurgical changes.  ID is ordered MRI of ankle due to concern for plantar wound infection.  High fevers have defervesced now.  AKI on CKD 4, the setting of sepsis and recent Bactrim administration, improving Baseline creatinine appears to be 4 Presented with creatinine greater than 5 Nephrology follow-up appreciated.  Suspecting AKI due to sepsis and recent Bactrim use. UA relatively bland.  Renal ultrasound without obstruction. Creatinine seems to have plateaued in the high 4 range for the last 4 days which may be a new baseline for him.  Clinically not uremic and no urgent indications for dialysis. Closely monitor urine output.  Nonoliguric.   Avoid nephrotoxic agents, hypotension and IV contrast unless absolutely necessary. Continue to hold off torsemide/ARB/SGLT2 inhibitors Trend daily BMP. Clinically euvolemic.  Off IV fluids. As per nephrology, has outpatient appointment with Dr. Juleen China on 10/20/2021.  Nephrology recommends holding olmesartan until he sees his nephrologist and can resume home torsemide at discharge.  Nephrology signed off 1/13.  Non anion gap metabolic acidosis in the setting of AKI Continue trending daily BMP.  Bicarb mildly low but stable overall.  Normal anion gap.  Nephrology assisted with care and signed off on 1/13.  Hyponatremia Likely due to AKI.  Trend daily BMP.  Elevated liver chemistries, unclear etiology Continue to monitor closely Avoid hepatotoxic agents. No significant change, stable  Hypertension BP is currently at goal Currently not on antihypertensives to avoid hypotension No ARB or diuretics at this time. Norvasc okay if needed Continue to closely monitor vital signs.  Type 2  diabetes with hyperglycemia Hemoglobin A1c 8.3 on 10/10/2021. Continue insulin sliding scale.  Mildly uncontrolled and fluctuating. Avoid hypoglycemia.  Anemia of chronic disease Drop in hemoglobin 8.4 from  9.8.  Hemoglobin stable in the mid 8 g range over the last 72 hours.  Hemoglobin up to 9 today Continue to closely monitor H&H.    Code Status: Full code Family Communication: Spouse at bedside. Disposition Plan: Pending clinical improvement and clearance by specialist.  Consultants: Nephrology Podiatry  Procedures: Post I&D on 10/10/2021 and 1/11.  Antimicrobials: IV Zosyn 1/8 > IV linezolid-discontinued IV daptomycin 1/12 >  DVT prophylaxis: Subcu heparin 3 times daily  Status is: Inpatient  Patient requires at least 2 midnights for further evaluation and treatment of present condition.  Subjective: No new complaints.  States that he does not want any more cutting on his foot.  Objective: Vitals:   10/13/21 0740 10/13/21 2045 10/14/21 0749 10/14/21 1509  BP: (!) 151/66 (!) 172/80 (!) 165/77 (!) 153/75  Pulse: 94 95 97 87  Resp: 18  18 18   Temp: 98.9 F (37.2 C) 100 F (37.8 C) 98.4 F (36.9 C)   TempSrc: Oral Oral Oral   SpO2: 100% 100% 100% 100%  Weight:      Height:        Intake/Output Summary (Last 24 hours) at 10/14/2021 1622 Last data filed at 10/14/2021 1500 Gross per 24 hour  Intake 720 ml  Output 200 ml  Net 520 ml   Filed Weights   10/10/21 0026 10/12/21 1002  Weight: 93 kg 94.3 kg    Exam:  General exam: Middle-age male, moderately built and nourished lying comfortably propped up in bed without distress.  Does not look septic or toxic. Respiratory system: Clear to auscultation. Respiratory effort normal. Cardiovascular system: S1 and S2 heard, RRR.  No JVD, murmurs or pedal edema. Gastrointestinal system: Abdomen is nondistended, soft and nontender. No organomegaly or masses felt. Normal bowel sounds heard. Central nervous system: Alert and oriented. No focal neurological deficits. Extremities: Left foot dressing had been removed.  Amputation of left first and second toes.  Surgical staple site along the medial forefoot intact, mild darkening  of skin near the staple line.  Fairly large deep ulcer on medial aspect of left heel with some necrotic tissue appreciated.  No fluctuance or drainage. Skin: No rashes, lesions or ulcers Psychiatry: Judgement and insight appear normal. Mood & affect appropriate.     Data Reviewed: CBC: Recent Labs  Lab 10/10/21 0028 10/11/21 0340 10/12/21 0219 10/13/21 0349 10/14/21 0613  WBC 24.7* 23.3* 23.3* 22.5* 18.1*  NEUTROABS 20.0*  --   --   --   --   HGB 9.8* 8.4* 8.5* 8.1* 9.0*  HCT 29.7* 26.2* 26.4* 25.1* 28.2*  MCV 87.9 87.0 88.0 87.5 88.7  PLT 309 291 307 304 323   Basic Metabolic Panel: Recent Labs  Lab 10/10/21 0028 10/11/21 0340 10/12/21 0219 10/13/21 0349 10/14/21 0613  NA 134* 132* 129* 131* 133*  K 4.2 4.9 4.8 4.7 4.4  CL 106 104 103 106 109  CO2 18* 20* 17* 18* 20*  GLUCOSE 108* 198* 145* 135* 225*  BUN 44* 39* 42* 40* 40*  CREATININE 5.25* 4.73* 4.61* 4.84* 4.52*  CALCIUM 7.6* 7.4* 7.3* 7.4* 7.3*  PHOS  --   --  3.5  --   --    GFR: Estimated Creatinine Clearance: 18.7 mL/min (A) (by C-G formula based on SCr of 4.52 mg/dL (H)). Liver Function Tests: Recent  Labs  Lab 10/10/21 0028 10/12/21 0219 10/13/21 0349  AST 51*  --  69*  ALT 45*  --  55*  ALKPHOS 131*  --  128*  BILITOT 0.3  --  0.5  PROT 8.0  --  6.8  ALBUMIN 2.1* 1.5* <1.5*    HbA1C: No results for input(s): HGBA1C in the last 72 hours.  CBG: Recent Labs  Lab 10/13/21 1155 10/13/21 1529 10/13/21 2107 10/14/21 0759 10/14/21 1124  GLUCAP 224* 206* 278* 209* 215*   Urine analysis:    Component Value Date/Time   COLORURINE YELLOW 10/10/2021 2033   APPEARANCEUR CLEAR 10/10/2021 2033   LABSPEC 1.015 10/10/2021 2033   PHURINE 6.0 10/10/2021 2033   GLUCOSEU NEGATIVE 10/10/2021 2033   HGBUR MODERATE (A) 10/10/2021 2033   BILIRUBINUR NEGATIVE 10/10/2021 2033   KETONESUR NEGATIVE 10/10/2021 2033   PROTEINUR 100 (A) 10/10/2021 2033   NITRITE NEGATIVE 10/10/2021 2033   LEUKOCYTESUR  NEGATIVE 10/10/2021 2033    Recent Results (from the past 240 hour(s))  WOUND CULTURE     Status: Abnormal   Collection Time: 10/06/21  5:02 PM   Specimen: Wound  Result Value Ref Range Status   MICRO NUMBER: [id number]  Final   SPECIMEN QUALITY: Adequate  Final   SOURCE: WOUND (SITE NOT SPECIFIED)  Final   STATUS: FINAL  Final   GRAM STAIN:   Final    Few Polymorphonuclear leukocytes No epithelial cells seen Few Gram positive cocci in pairs Few Gram negative bacilli   ISOLATE 1: Citrobacter koseri (A)  Final    Comment: Heavy growth of Citrobacter koseri      Susceptibility   Citrobacter koseri - AEROBIC CULT, GRAM STAIN NEGATIVE 1    AMOX/CLAVULANIC <=2 Sensitive     CEFAZOLIN* <=4 Not Reportable      * For infections other than uncomplicated UTI caused by E. coli, K. pneumoniae or P. mirabilis: Cefazolin is resistant if MIC > or = 8 mcg/mL. (Distinguishing susceptible versus intermediate for isolates with MIC < or = 4 mcg/mL requires additional testing.)     CEFTAZIDIME <=1 Sensitive     CEFEPIME <=1 Sensitive     CEFTRIAXONE <=1 Sensitive     CIPROFLOXACIN <=0.25 Sensitive     LEVOFLOXACIN <=0.12 Sensitive     GENTAMICIN <=1 Sensitive     IMIPENEM 1 Sensitive     PIP/TAZO <=4 Sensitive     TOBRAMYCIN <=1 Sensitive     TRIMETH/SULFA* <=20 Sensitive      * For infections other than uncomplicated UTI caused by E. coli, K. pneumoniae or P. mirabilis: Cefazolin is resistant if MIC > or = 8 mcg/mL. (Distinguishing susceptible versus intermediate for isolates with MIC < or = 4 mcg/mL requires additional testing.) Legend: S = Susceptible  I = Intermediate R = Resistant  NS = Not susceptible * = Not tested  NR = Not reported **NN = See antimicrobic comments   Aerobic/Anaerobic Culture w Gram Stain (surgical/deep wound)     Status: None (Preliminary result)   Collection Time: 10/10/21 12:20 AM   Specimen: PATH Other; Tissue  Result Value Ref Range Status   Specimen  Description WOUND  Final   Special Requests FIRST METATARSAL BONE  Final   Gram Stain NO WBC SEEN NO ORGANISMS SEEN   Final   Culture   Final    NO GROWTH 2 DAYS NO ANAEROBES ISOLATED; CULTURE IN PROGRESS FOR 5 DAYS Performed at [facility], [place],  [place] [address]    Report Status PENDING  Incomplete  Resp Panel by RT-PCR (Flu A&B, Covid) Nasopharyngeal Swab     Status: None   Collection Time: 10/10/21 12:28 AM   Specimen: Nasopharyngeal Swab; Nasopharyngeal(NP) swabs in vial transport medium  Result Value Ref Range Status   SARS Coronavirus 2 by RT PCR NEGATIVE NEGATIVE Final    Comment: (NOTE) SARS-CoV-2 target nucleic acids are NOT DETECTED.  The SARS-CoV-2 RNA is generally detectable in upper respiratory specimens during the acute phase of infection. The lowest concentration of SARS-CoV-2 viral copies this assay can detect is 138 copies/mL. A negative result does not preclude SARS-Cov-2 infection and should not be used as the sole basis for treatment or other patient management decisions. A negative result may occur with  improper specimen collection/handling, submission of specimen other than nasopharyngeal swab, presence of viral mutation(s) within the areas targeted by this assay, and inadequate number of viral copies(<138 copies/mL). A negative result must be combined with clinical observations, patient history, and epidemiological information. The expected result is Negative.  Fact Sheet for Patients:  EntrepreneurPulse.com.au  Fact Sheet for Healthcare Providers:  IncredibleEmployment.be  This test is no t yet approved or cleared by the [place] FDA and  has been authorized for detection and/or diagnosis of SARS-CoV-2 by FDA under an Emergency Use Authorization (EUA). This EUA will remain  in effect (meaning this test can be used) for the duration of the COVID-19 declaration under Section 564(b)(1)  of the Act, 21 U.S.C.section 360bbb-3(b)(1), unless the authorization is terminated  or revoked sooner.       Influenza A by PCR NEGATIVE NEGATIVE Final   Influenza B by PCR NEGATIVE NEGATIVE Final    Comment: (NOTE) The Xpert Xpress SARS-CoV-2/FLU/RSV plus assay is intended as an aid in the diagnosis of influenza from Nasopharyngeal swab specimens and should not be used as a sole basis for treatment. Nasal washings and aspirates are unacceptable for Xpert Xpress SARS-CoV-2/FLU/RSV testing.  Fact Sheet for Patients: EntrepreneurPulse.com.au  Fact Sheet for Healthcare Providers: IncredibleEmployment.be  This test is not yet approved or cleared by the [place] FDA and has been authorized for detection and/or diagnosis of SARS-CoV-2 by FDA under an Emergency Use Authorization (EUA). This EUA will remain in effect (meaning this test can be used) for the duration of the COVID-19 declaration under Section 564(b)(1) of the Act, 21 U.S.C. section 360bbb-3(b)(1), unless the authorization is terminated or revoked.  Performed at [facility], [place] [address] [place], [place] [address]   Blood culture (routine x 2)     Status: None (Preliminary result)   Collection Time: 10/10/21  1:00 AM   Specimen: BLOOD  Result Value Ref Range Status   Specimen Description BLOOD LEFT ARM  Final   Special Requests   Final    BOTTLES DRAWN AEROBIC AND ANAEROBIC Blood Culture adequate volume   Culture   Final    NO GROWTH 4 DAYS Performed at [facility], [place] [address], [place], [place] [address]    Report Status PENDING  Incomplete  Blood culture (routine x 2)     Status: None (Preliminary result)   Collection Time: 10/10/21  1:05 AM   Specimen: BLOOD  Result Value Ref Range Status   Specimen Description BLOOD RIGHT ARM  Final   Special Requests   Final    BOTTLES DRAWN AEROBIC AND ANAEROBIC Blood Culture results may not be optimal due to an  inadequate volume of blood  received in culture bottles   Culture   Final    NO GROWTH 4 DAYS Performed at [facility], [place] [employer] [address], [place], [place] [address]    Report Status PENDING  Incomplete  Aerobic Culture w Gram Stain (superficial specimen)     Status: None   Collection Time: 10/10/21  6:24 AM   Specimen: Wound  Result Value Ref Range Status   Specimen Description WOUND  Final   Special Requests SINOSP  Final   Gram Stain   Final    ABUNDANT WBC PRESENT, PREDOMINANTLY PMN MODERATE GRAM POSITIVE COCCI    Culture   Final    RARE MULTIPLE ORGANISMS PRESENT, NONE PREDOMINANT NO GROUP A STREP (S.PYOGENES) ISOLATED NO STAPHYLOCOCCUS AUREUS ISOLATED Performed at [facility], [place] [address] [place], [place] [address]    Report Status 10/12/2021 FINAL  Final  Aerobic/Anaerobic Culture w Gram Stain (surgical/deep wound)     Status: None (Preliminary result)   Collection Time: 10/10/21  9:49 PM   Specimen: Abscess; Wound  Result Value Ref Range Status   Specimen Description ABSCESS LEFT FOOT  Final   Special Requests NONE  Final   Gram Stain   Final    FEW WBC PRESENT, PREDOMINANTLY PMN FEW GRAM POSITIVE COCCI IN CHAINS    Culture   Final    FEW STREPTOCOCCUS ANGINOSIS FEW PROTEUS MIRABILIS FEW PREVOTELLA BIVIA BETA LACTAMASE POSITIVE CULTURE REINCUBATED FOR BETTER GROWTH Performed at [facility], [place] [address], [place], [place] [address]    Report Status PENDING  Incomplete   Organism ID, Bacteria STREPTOCOCCUS ANGINOSIS  Final   Organism ID, Bacteria PROTEUS MIRABILIS  Final      Susceptibility   Proteus mirabilis - MIC*    AMPICILLIN <=2 SENSITIVE Sensitive     CEFAZOLIN 8 SENSITIVE Sensitive     CEFEPIME <=0.12 SENSITIVE Sensitive     CEFTAZIDIME <=1 SENSITIVE Sensitive     CEFTRIAXONE <=0.25 SENSITIVE Sensitive     CIPROFLOXACIN <=0.25 SENSITIVE Sensitive     GENTAMICIN <=1 SENSITIVE Sensitive     IMIPENEM 2 SENSITIVE Sensitive      TRIMETH/SULFA <=20 SENSITIVE Sensitive     AMPICILLIN/SULBACTAM <=2 SENSITIVE Sensitive     PIP/TAZO <=4 SENSITIVE Sensitive     * FEW PROTEUS MIRABILIS   Streptococcus anginosis - MIC*    PENICILLIN <=0.06 SENSITIVE Sensitive     CEFTRIAXONE <=0.12 SENSITIVE Sensitive     ERYTHROMYCIN <=0.12 SENSITIVE Sensitive     LEVOFLOXACIN 1 SENSITIVE Sensitive     VANCOMYCIN 0.5 SENSITIVE Sensitive     * FEW STREPTOCOCCUS ANGINOSIS  MRSA Next Gen by PCR, Nasal     Status: None   Collection Time: 10/11/21  5:54 AM   Specimen: Nasal Mucosa; Nasal Swab  Result Value Ref Range Status   MRSA by PCR Next Gen NOT DETECTED NOT DETECTED Final    Comment: (NOTE) The GeneXpert MRSA Assay (FDA approved for NASAL specimens only), is one component of a comprehensive MRSA colonization surveillance program. It is not intended to diagnose MRSA infection nor to guide or monitor treatment for MRSA infections. Test performance is not FDA approved in patients less than 79 years old. Performed at [facility], [name] [address] [place], [place] [address]       Studies: MR FOOT LEFT WO CONTRAST  Result Date: 10/14/2021 CLINICAL DATA:  Osteomyelitis. Status post left foot irrigation and debridement as well as amputation of the left first metatarsal.  Admitted 01/09 for sepsis. 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 EXAM: MRI OF THE LEFT FOOT WITHOUT CONTRAST TECHNIQUE: Multiplanar, multisequence MR imaging of the left forefoot was performed. No intravenous contrast was administered. COMPARISON:  Left foot radiographs 10/06/2021 and 10/10/2021, MRI left forefoot 10/11/2021 FINDINGS: Bones/Joint/Cartilage There is metallic artifact seen throughout the distal medial forefoot in the region of new interval first ray amputation compared to 10/10/2021. There is now amputation to the distal shaft of the first metatarsal, where previously there was amputation to the proximal aspect of the proximal  phalanx. There is moderate regional soft tissue swelling of the mid to distal medial aspect of the foot and surrounding the postsurgical aspect of the resected distal first metatarsal. There is mild expected erosion of the stump of the postsurgical first metatarsal shaft consistent with recent surgery. Mild marrow edema throughout the remaining shaft of the first metatarsal. There is mild fluid within the soft tissues at the distal amputation stump. There is metallic artifact overlying the distal shaft of second metatarsal where there is again amputation to this level. There is again partial amputation of the third metatarsal head and adjacent base of the proximal phalanx of the third toe. Ligaments The Lisfranc ligament complex is intact. Muscles and Tendons No tendon tear is identified. Soft tissues Mild edema within the plantar foot musculature. Mild soft tissue edema and swelling of the dorsal lateral midfoot. IMPRESSION:: IMPRESSION: 1. Interval further amputation of the first ray, now to the distal shaft. Expected postoperative changes including soft tissue swelling and mild fluid around the amputation stump. 2. More remote amputations of the distal second and third metatarsals, as above. Electronically Signed   By: [name] M.D.   On: 10/14/2021 15:21    Scheduled Meds:  (feeding supplement) PROSource Plus  30 mL Oral BID BM   atorvastatin  80 mg Oral Daily   feeding supplement (NEPRO CARB STEADY)  237 mL Oral BID BM   heparin injection (subcutaneous)  5,000 Units Subcutaneous Q8H   insulin aspart  0-6 Units Subcutaneous TID WC   multivitamin with minerals  1 tablet Oral Daily   pregabalin  75 mg Oral BID   senna-docusate  2 tablet Oral Daily    Continuous Infusions:  sodium chloride Stopped (10/12/21 1015)   DAPTOmycin (CUBICIN)  IV 750 mg (10/13/21 2055)   piperacillin-tazobactam (ZOSYN)  IV 2.25 g (10/14/21 1311)     LOS: 4 days    Vernell Leep, MD,  FACP, [facility], [facility], [facility]  (Care Management Physician Certified) [place]  To contact the attending provider between 7A-7P or the covering provider during after hours 7P-7A, please log into the web site [email or web address] and access using universal [place] password for that web site. If you do not have the password, please call the hospital operator.

## 2021-10-14 NOTE — Progress Notes (Signed)
[facility] Progress Note    Assessment/ Plan:     AKI on CKD IV: last Cr 08/2020 was 4.0, per pt last eGFR 17% now up to 5.25 (EGFR 11%) in setting of sepsis and recent Bactrim administration. UA relatively bland.   - check renal US- no obstruction - not uremic, no need for HD--> fortunately Cr is improving 4.7 10/11/21--> 4.6 10/12/21--> 4.7 10/13/21--> 4.5 10/14/21 - Bactrim contraindicated in advanced CKD - hold torsemide/ ARB/ SGLT2i etc and all other nephrotoxic agents - no IV contrast unless absolutely necessary - daily weights, strict I/O - Has outpt appt with Dr Abigail Butts 10/20/21 - would hold olmesartan until he is seen by OP nephrologist, can resume home torsemide on d/c - will sign off for now- please call with questions   2.  Osteo of L foot with sepsis- on linezolid/ Zosyn, wound cultures and blood cultures obtained.  ABIs normal.  went to OR with podiatry 10/10/21 for debridement, back to OR1/11   3. HTN: avoid ARB and diuretics, amlodipine (on home list) OK   4.  DM II: per primary  5.  Hyponatremia: mild, continue to monitor   6.  Dispo: admitted  Subjective:    Cr improving.  MRI repeat pending to eval for ongoing infection.  On dapto and zosyn--> polymicrobial infection.      Objective:   BP (!) 153/75    Pulse 87    Temp 98.4 F (36.9 C) (Oral)    Resp 18    Ht 6' (1.829 m)    Wt 94.3 kg    SpO2 100%    BMI 28.21 kg/m   Intake/Output Summary (Last 24 hours) at 10/14/2021 1531 Last data filed at 10/14/2021 1500 Gross per 24 hour  Intake 720 ml  Output 200 ml  Net 520 ml   Weight change:   Physical Exam: GEN: NAD, sitting in bed HEENT EOMI PERRL NECK no JVD PULM clear CV RRR ABD soft EXT L foot dressed, no edema bilaterally NEURO AAO x 3   Imaging: MR FOOT LEFT WO CONTRAST  Result Date: 10/14/2021 CLINICAL DATA:  Osteomyelitis. Status post left foot irrigation and debridement as well as amputation of the left first metatarsal. Admitted  01/09 for sepsis. 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 EXAM: MRI OF THE LEFT FOOT WITHOUT CONTRAST TECHNIQUE: Multiplanar, multisequence MR imaging of the left forefoot was performed. No intravenous contrast was administered. COMPARISON:  Left foot radiographs 10/06/2021 and 10/10/2021, MRI left forefoot 10/11/2021 FINDINGS: Bones/Joint/Cartilage There is metallic artifact seen throughout the distal medial forefoot in the region of new interval first ray amputation compared to 10/10/2021. There is now amputation to the distal shaft of the first metatarsal, where previously there was amputation to the proximal aspect of the proximal phalanx. There is moderate regional soft tissue swelling of the mid to distal medial aspect of the foot and surrounding the postsurgical aspect of the resected distal first metatarsal. There is mild expected erosion of the stump of the postsurgical first metatarsal shaft consistent with recent surgery. Mild marrow edema throughout the remaining shaft of the first metatarsal. There is mild fluid within the soft tissues at the distal amputation stump. There is metallic artifact overlying the distal shaft of second metatarsal where there is again amputation to this level. There is again partial amputation of the third metatarsal head and adjacent base of the proximal phalanx of the third toe. Ligaments The Lisfranc ligament complex is intact. Muscles and Tendons  No tendon tear is identified. Soft tissues Mild edema within the plantar foot musculature. Mild soft tissue edema and swelling of the dorsal lateral midfoot. IMPRESSION:: IMPRESSION: 1. Interval further amputation of the first ray, now to the distal shaft. Expected postoperative changes including soft tissue swelling and mild fluid around the amputation stump. 2. More remote amputations of the distal second and third metatarsals, as above. Electronically Signed   By: [name] M.D.   On: 10/14/2021 15:21     Labs: BMET Recent Labs  Lab 10/10/21 0028 10/11/21 0340 10/12/21 0219 10/13/21 0349 10/14/21 0613  NA 134* 132* 129* 131* 133*  K 4.2 4.9 4.8 4.7 4.4  CL 106 104 103 106 109  CO2 18* 20* 17* 18* 20*  GLUCOSE 108* 198* 145* 135* 225*  BUN 44* 39* 42* 40* 40*  CREATININE 5.25* 4.73* 4.61* 4.84* 4.52*  CALCIUM 7.6* 7.4* 7.3* 7.4* 7.3*  PHOS  --   --  3.5  --   --    CBC Recent Labs  Lab 10/10/21 0028 10/11/21 0340 10/12/21 0219 10/13/21 0349 10/14/21 0613  WBC 24.7* 23.3* 23.3* 22.5* 18.1*  NEUTROABS 20.0*  --   --   --   --   HGB 9.8* 8.4* 8.5* 8.1* 9.0*  HCT 29.7* 26.2* 26.4* 25.1* 28.2*  MCV 87.9 87.0 88.0 87.5 88.7  PLT 309 291 307 304 358    Medications:     (feeding supplement) PROSource Plus  30 mL Oral BID BM   atorvastatin  80 mg Oral Daily   feeding supplement (NEPRO CARB STEADY)  237 mL Oral BID BM   heparin injection (subcutaneous)  5,000 Units Subcutaneous Q8H   insulin aspart  0-6 Units Subcutaneous TID WC   multivitamin with minerals  1 tablet Oral Daily   pregabalin  75 mg Oral BID   senna-docusate  2 tablet Oral Daily      Madelon Lips, MD 10/14/2021, 3:31 PM

## 2021-10-14 NOTE — Plan of Care (Signed)
°  Problem: Education: Goal: Knowledge of General Education information will improve Description: Including pain rating scale, medication(s)/side effects and non-pharmacologic comfort measures Outcome: Progressing   Problem: Health Behavior/Discharge Planning: Goal: Ability to manage health-related needs will improve Outcome: Progressing   Problem: Clinical Measurements: Goal: Ability to maintain clinical measurements within normal limits will improve Outcome: Progressing Goal: Will remain free from infection Outcome: Progressing Goal: Diagnostic test results will improve Outcome: Progressing Goal: Respiratory complications will improve Outcome: Progressing Goal: Cardiovascular complication will be avoided Outcome: Progressing   Problem: Clinical Measurements: Goal: Will remain free from infection Outcome: Progressing   Problem: Clinical Measurements: Goal: Diagnostic test results will improve Outcome: Progressing   Problem: Clinical Measurements: Goal: Respiratory complications will improve Outcome: Progressing   Problem: Clinical Measurements: Goal: Cardiovascular complication will be avoided Outcome: Progressing   Problem: Nutrition: Goal: Adequate nutrition will be maintained Outcome: Progressing   Problem: Activity: Goal: Risk for activity intolerance will decrease Outcome: Progressing   Problem: Elimination: Goal: Will not experience complications related to bowel motility Outcome: Progressing Goal: Will not experience complications related to urinary retention Outcome: Progressing   Problem: Pain Managment: Goal: General experience of comfort will improve Outcome: Progressing   Problem: Safety: Goal: Ability to remain free from injury will improve Outcome: Progressing   Problem: Skin Integrity: Goal: Risk for impaired skin integrity will decrease Outcome: Progressing

## 2021-10-14 NOTE — Progress Notes (Signed)
Subjective: [name] is a 71 y.o. male patient seen at bedside, resting comfortably in no acute distress s/p day #2 left I&D on 1/9 and 1st metatarsal resection on 1/11 performed by Dr. March Rummage. Patient denies consitutional symptoms at this time.  Patient is assisted by wife bedside.   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[place]) 10/10/2021   Acute kidney injury superimposed on CKD ([place]) 10/10/2021   Anemia in chronic kidney disease 05/27/2020   Secondary hyperparathyroidism of renal origin ([place]) 02/25/2020   Diabetic foot ulcer ([place]) 10/07/2019   Benign hypertensive kidney disease with chronic kidney disease 06/17/2019   Proteinuria 06/17/2019   History of amputation of lesser toe of left foot ([place]) 08/18/2015   Hyperlipidemia, mixed 08/18/2015   Type 2 diabetes mellitus with peripheral neuropathy ([place]) 08/18/2015   Acute CVA (cerebrovascular accident) ([place]) 08/09/2015   Essential hypertriglyceridemia 08/09/2015   Hypertension, essential 08/09/2015   Right-sided muscle weakness 08/09/2015   T2DM (type 2 diabetes mellitus) ([place]) 08/09/2015   Tobacco abuse 08/09/2015   CKD (chronic kidney disease) stage 4, GFR 15-29 ml/min ([place]) 04/17/2013   DM2 (diabetes mellitus, type 2) ([place]) 04/17/2013   HTN (hypertension) 04/17/2013   Osteomyelitis of toe of left foot ([place]) 04/17/2013     Current Facility-Administered Medications:    (feeding supplement) PROSource Plus liquid 30 mL, 30 mL, Oral, BID BM, Evelina Bucy, DPM, 30 mL at 10/14/21 1318   0.9 %  sodium chloride infusion, , Intravenous, Continuous, Price, Christian Mate, DPM, Stopped at 10/12/21 1015   acetaminophen (TYLENOL) tablet 650 mg, 650 mg, Oral, Q6H PRN, 650 mg at 10/12/21 2119 **OR** acetaminophen (TYLENOL) suppository 650 mg, 650 mg, Rectal, Q6H PRN, Evelina Bucy, DPM   atorvastatin (LIPITOR) tablet 80 mg, 80 mg, Oral, Daily, Evelina Bucy, DPM, 80 mg at  10/14/21 0944   DAPTOmycin (CUBICIN) 750 mg in sodium chloride 0.9 % IVPB, 8 mg/kg, Intravenous, Q48H, Laurice Record, MD, Last Rate: 130 mL/hr at 10/13/21 2055, 750 mg at 10/13/21 2055   feeding supplement (NEPRO CARB STEADY) liquid 237 mL, 237 mL, Oral, BID BM, Evelina Bucy, DPM, 237 mL at 10/14/21 0944   heparin injection 5,000 Units, 5,000 Units, Subcutaneous, Q8H, Evelina Bucy, DPM, 5,000 Units at 10/14/21 1318   HYDROcodone-acetaminophen (NORCO/VICODIN) 5-325 MG per tablet 1 tablet, 1 tablet, Oral, Q6H PRN, Evelina Bucy, DPM, 1 tablet at 10/14/21 1538   HYDROmorphone (DILAUDID) injection 1 mg, 1 mg, Intravenous, Q4H PRN, Evelina Bucy, DPM, 1 mg at 10/14/21 0521   insulin aspart (novoLOG) injection 0-9 Units, 0-9 Units, Subcutaneous, TID WC, Reome, Earle J, RPH, 5 Units at 10/14/21 1900   multivitamin with minerals tablet 1 tablet, 1 tablet, Oral, Daily, Evelina Bucy, DPM, 1 tablet at 10/14/21 0943   ondansetron (ZOFRAN) tablet 4 mg, 4 mg, Oral, Q6H PRN **OR** ondansetron (ZOFRAN) injection 4 mg, 4 mg, Intravenous, Q6H PRN, Price, Christian Mate, DPM   piperacillin-tazobactam (ZOSYN) IVPB 2.25 g, 2.25 g, Intravenous, Q8H, Price, Michael J, DPM, Last Rate: 100 mL/hr at 10/14/21 1311, 2.25 g at 10/14/21 1311   pregabalin (LYRICA) capsule 75 mg, 75 mg, Oral, BID, Evelina Bucy, DPM, 75 mg at 10/14/21 0944   senna-docusate (Senokot-S) tablet 2 tablet, 2 tablet, Oral, Daily, Evelina Bucy, DPM, 2 tablet at 10/14/21 0944  No Known Allergies   Objective: Today's Vitals   10/14/21 0800 10/14/21 1220  10/14/21 1509 10/14/21 2000  BP:   (!) 153/75   Pulse:   87   Resp:   18   Temp:      TempSrc:      SpO2:   100%   Weight:      Height:      PainSc: 3  3   0-No pain    General: No acute distress  Left lower extremity: Dressing to left foot clean, dry, intact. No strikethrough noted, Upon removal of dressings sutures and staples intact with no dehiscence at metatarsal  resection site. Decreased erythema, decreased edema, mild bloody drainage from left medial heel wound upon removal of the packing fatty tissue was noted no active purulence, necrosis at wound edges with no malodor, decreased erythema, minimal edema no tenderness to palpation to the heel.  No calf pain.   MRI Left foot: 1. Interval further amputation of the first ray, now to the distal shaft. Expected postoperative changes including soft tissue swelling and mild fluid around the amputation stump. 2. More remote amputations of the distal second and third metatarsals, as above.   MRI Left ankle: Awaiting reading   Assessment and Plan:  Problem List Items Addressed This Visit       Endocrine   Diabetic foot ulcer ([place])   Relevant Medications   atorvastatin (LIPITOR) tablet 80 mg   insulin aspart (novoLOG) injection 0-9 Units     Other   * (Principal) Sepsis ([place])   Other Visit Diagnoses     Other acute osteomyelitis of left foot ([place])    -  Primary   Relevant Medications   DAPTOmycin (CUBICIN) 750 mg in sodium chloride 0.9 % IVPB   AKI (acute kidney injury) ([place])       Relevant Orders   US RENAL (Completed)       -Patient seen and evaluated at bedside -Chart reviewed -Dressing change preformed; applied dry dressing to left foot -Advised patient to make sure to keep dressing clean, dry, and intact to left foot allowing nursing to change dressings as ordered if there is strike through  -Awaiting final intra-op cultures; continue with IV antibiotics per infectious disease recommendations -Awaiting final MRI reading of the ankle; if negative patient will not need repeat surgery can perform beside debridement of necrotic wound edges  -Limited weightbearing to heel with cam boot for bedside and bathroom only with assistive device -Continue with rest and elevation to assist with pain and edema control and heel offloading as ordered  -Podiatry will continue to follow closely   -Anticipated discharge plan of care: To home and likely in a few days with antibiotics after WBC downtrends, pending continued medical and clinical improvement. Patient will need home nursing or family to help with dressing changes of packing to the left heel every other day upon discharge.  Office will contact patient for follow up appointment after discharge.   [name], DPM [facility] [phone] office [phone] cell Available via secure chat

## 2021-10-14 NOTE — TOC Progression Note (Signed)
Transition of Care [facility]) - Progression Note    Patient Details  Name: Dallin Mccorkel MRN: [id number] Date of Birth: Nov 27, 1950  Transition of Care [facility]) CM/SW Contact  Sharin Mons, RN Phone Number: 10/14/2021, 8:43 AM  Clinical Narrative:    Per MD note pt will need home health services for wound care. NCM spoke with pt/ wife @ bedside. Pt agreeable to home health services. Choice offered. Pt without preference. Referral made [place] and accepted. Will need home health RN order and F2F from MD for home health services.  TOC team will continue to monitor for needs.... Expected Discharge Plan: [place] Barriers to Discharge: Continued Medical Work up  Expected Discharge Plan and Services Expected Discharge Plan: [place]   Discharge Planning Services: CM Consult   Living arrangements for the past 2 months: Single Family Home                           HH Arranged: RN [facility] Agency: [place] Date [place]: 10/14/21 Time [place]: [phone] Representative spoke with at [place]: [place] ([place]) Interventions    Readmission Risk Interventions No flowsheet data found.

## 2021-10-15 DIAGNOSIS — N189 Chronic kidney disease, unspecified: Secondary | ICD-10-CM | POA: Diagnosis not present

## 2021-10-15 DIAGNOSIS — N179 Acute kidney failure, unspecified: Secondary | ICD-10-CM | POA: Diagnosis not present

## 2021-10-15 DIAGNOSIS — R6520 Severe sepsis without septic shock: Secondary | ICD-10-CM | POA: Diagnosis not present

## 2021-10-15 DIAGNOSIS — A419 Sepsis, unspecified organism: Secondary | ICD-10-CM | POA: Diagnosis not present

## 2021-10-15 LAB — GLUCOSE, CAPILLARY
Glucose-Capillary: 152 mg/dL — ABNORMAL HIGH (ref 70–99)
Glucose-Capillary: 221 mg/dL — ABNORMAL HIGH (ref 70–99)
Glucose-Capillary: 282 mg/dL — ABNORMAL HIGH (ref 70–99)
Glucose-Capillary: 239 mg/dL — ABNORMAL HIGH (ref 70–99)

## 2021-10-15 LAB — CULTURE, BLOOD (ROUTINE X 2)
Culture: NO GROWTH
Culture: NO GROWTH
Special Requests: ADEQUATE

## 2021-10-15 LAB — BASIC METABOLIC PANEL
Anion gap: 10 (ref 5–15)
BUN: 40 mg/dL — ABNORMAL HIGH (ref 8–23)
CO2: 17 mmol/L — ABNORMAL LOW (ref 22–32)
Calcium: 7.6 mg/dL — ABNORMAL LOW (ref 8.9–10.3)
Chloride: 106 mmol/L (ref 98–111)
Creatinine, Ser: 4.26 mg/dL — ABNORMAL HIGH (ref 0.61–1.24)
GFR, Estimated: 14 mL/min — ABNORMAL LOW (ref >60)
Glucose, Bld: 228 mg/dL — ABNORMAL HIGH (ref 70–99)
Potassium: 4.7 mmol/L (ref 3.5–5.1)
Sodium: 133 mmol/L — ABNORMAL LOW (ref 135–145)

## 2021-10-15 LAB — CBC
HCT: 25 % — ABNORMAL LOW (ref 39.0–52.0)
Hemoglobin: 8 g/dL — ABNORMAL LOW (ref 13.0–17.0)
MCH: 28.4 pg (ref 26.0–34.0)
MCHC: 32 g/dL (ref 30.0–36.0)
MCV: 88.7 fL (ref 80.0–100.0)
Platelets: 335 10*3/uL (ref 150–400)
RBC: 2.82 MIL/uL — ABNORMAL LOW (ref 4.22–5.81)
RDW: 14.5 % (ref 11.5–15.5)
WBC: 15.6 10*3/uL — ABNORMAL HIGH (ref 4.0–10.5)
nRBC: 0 % (ref 0.0–0.2)

## 2021-10-15 MED ORDER — AMLODIPINE BESYLATE 2.5 MG PO TABS
2.5000 mg | ORAL_TABLET | Freq: Every day | ORAL | Status: DC
  Administered 2021-10-15: 2.5 mg via ORAL
  Filled 2021-10-15: qty 1

## 2021-10-15 NOTE — Progress Notes (Signed)
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   #Left foot osteomyelitis SP I&D on 1/9 and 1st metatarsal resection on 1/11 #Persistent fevers -OR Cx+ form 1/9+ strep anginosis, proteus mirabilis and Prevotella bivia -Pt was on bactrim for about 3 days prior to admission and received antibiotics for a bout a day inpatient prior to OR on 1/.9 -He is currently on daptomycin and pip-tazo  -MRI left ankle showed abscess in hindfoot, calcaneal acute osteomyelitis, abscess in the proximal abductor hallucis Recommendations -Continue pip-tazo and daptomycin. -Follow OR Cx -Podiatry following -Consider ortho consult -Monitor clinically  Microbiology:    Piptazo and daptomycin  SUBJECTIVE: Resting in bed. Discussed Imaging findings with with patient.   Review of Systems: Review of Systems  All other systems reviewed and are negative.   Scheduled Meds:  (feeding supplement) PROSource Plus  30 mL Oral BID BM   amLODipine  2.5 mg Oral Daily   atorvastatin  80 mg Oral Daily   feeding supplement (NEPRO CARB STEADY)  237 mL Oral BID BM   heparin injection (subcutaneous)  5,000 Units Subcutaneous Q8H   insulin aspart  0-9 Units Subcutaneous TID WC   multivitamin with minerals  1 tablet Oral Daily   pregabalin  75 mg Oral BID   senna-docusate  2 tablet Oral Daily   Continuous Infusions:  sodium chloride Stopped (10/12/21 1015)   DAPTOmycin (CUBICIN)  IV  750 mg (10/15/21 2012)   piperacillin-tazobactam (ZOSYN)  IV 2.25 g (10/15/21 1207)   PRN Meds:.acetaminophen **OR** acetaminophen, HYDROcodone-acetaminophen, HYDROmorphone (DILAUDID) injection, ondansetron **OR** ondansetron (ZOFRAN) IV No Known Allergies  OBJECTIVE: Vitals:   10/15/21 0814 10/15/21 1315 10/15/21 1609 10/15/21 2007  BP: (!) 149/73 (!) 145/68 (!) 152/72 (!) 181/84  Pulse: 95 100  83  Resp: 17 16  16   Temp: 98.9 F (37.2 C) 98.2 F (36.8 C)  98.7 F (37.1 C)  TempSrc: Oral Oral  Oral  SpO2: 99% 100%  100%  Weight:      Height:       Body mass index is 28.21 kg/m.  Physical Exam Constitutional:      General: He is not in acute distress.    Appearance: He is normal weight. He is not toxic-appearing.  HENT:     Head: Normocephalic and atraumatic.     Right Ear: External ear normal.     Left Ear: External ear normal.     Nose: No congestion or rhinorrhea.     Mouth/Throat:     Mouth: Mucous membranes are moist.     Pharynx: Oropharynx is clear.  Eyes:     Extraocular Movements: Extraocular movements intact.     Conjunctiva/sclera: Conjunctivae normal.     Pupils: Pupils are equal, round, and reactive to light.  Cardiovascular:     Rate and Rhythm: Normal rate and regular rhythm.  Heart sounds: No murmur heard.   No friction rub. No gallop.  Pulmonary:     Effort: Pulmonary effort is normal.     Breath sounds: Normal breath sounds.  Abdominal:     General: Abdomen is flat. Bowel sounds are normal.     Palpations: Abdomen is soft.  Musculoskeletal:        General: No swelling.     Cervical back: Normal range of motion and neck supple.     Comments: Left foot bandaged.  Skin:    General: Skin is warm and dry.  Neurological:     General: No focal deficit present.     Mental Status: He is oriented to person, place, and time.  Psychiatric:        Mood and Affect: Mood normal.      Lab Results Lab Results  Component Value Date   WBC 15.6 (H)  10/15/2021   HGB 8.0 (L) 10/15/2021   HCT 25.0 (L) 10/15/2021   MCV 88.7 10/15/2021   PLT 335 10/15/2021    Lab Results  Component Value Date   CREATININE 4.26 (H) 10/15/2021   BUN 40 (H) 10/15/2021   NA 133 (L) 10/15/2021   K 4.7 10/15/2021   CL 106 10/15/2021   CO2 17 (L) 10/15/2021    Lab Results  Component Value Date   ALT 55 (H) 10/13/2021   AST 69 (H) 10/13/2021   ALKPHOS 128 (H) 10/13/2021   BILITOT 0.5 10/13/2021        Laurice Record, [place] for Infectious Disease [facility] 10/15/2021, 9:15 PM

## 2021-10-15 NOTE — Progress Notes (Signed)
Subjective: [name] is a 71 y.o. male patient seen at bedside, resting comfortably in no acute distress s/p day #3 left I&D on 1/9 and 1st metatarsal resection on 1/11 performed by Dr. March Rummage. Patient denies consitutional symptoms at this time.  Patient was also met bedside to discuss final MRI results at left ankle.  Patient reports that he is already heard the bad news.  No other concerns noted.  Patient is assisted by wife bedside.   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[place]) 10/10/2021   Acute kidney injury superimposed on CKD ([place]) 10/10/2021   Anemia in chronic kidney disease 05/27/2020   Secondary hyperparathyroidism of renal origin ([place]) 02/25/2020   Diabetic foot ulcer ([place]) 10/07/2019   Benign hypertensive kidney disease with chronic kidney disease 06/17/2019   Proteinuria 06/17/2019   History of amputation of lesser toe of left foot ([place]) 08/18/2015   Hyperlipidemia, mixed 08/18/2015   Type 2 diabetes mellitus with peripheral neuropathy ([place]) 08/18/2015   Acute CVA (cerebrovascular accident) ([place]) 08/09/2015   Essential hypertriglyceridemia 08/09/2015   Hypertension, essential 08/09/2015   Right-sided muscle weakness 08/09/2015   T2DM (type 2 diabetes mellitus) ([place]) 08/09/2015   Tobacco abuse 08/09/2015   CKD (chronic kidney disease) stage 4, GFR 15-29 ml/min ([place]) 04/17/2013   DM2 (diabetes mellitus, type 2) ([place]) 04/17/2013   HTN (hypertension) 04/17/2013   Osteomyelitis of toe of left foot ([place]) 04/17/2013     Current Facility-Administered Medications:    (feeding supplement) PROSource Plus liquid 30 mL, 30 mL, Oral, BID BM, Evelina Bucy, DPM, 30 mL at 10/15/21 1425   0.9 %  sodium chloride infusion, , Intravenous, Continuous, Price, Christian Mate, DPM, Stopped at 10/12/21 1015   acetaminophen (TYLENOL) tablet 650 mg, 650 mg, Oral, Q6H PRN, 650 mg at 10/14/21 2108 **OR** acetaminophen (TYLENOL)  suppository 650 mg, 650 mg, Rectal, Q6H PRN, Evelina Bucy, DPM   amLODipine (NORVASC) tablet 2.5 mg, 2.5 mg, Oral, Daily, Hongalgi, Anand D, MD, 2.5 mg at 10/15/21 1609   atorvastatin (LIPITOR) tablet 80 mg, 80 mg, Oral, Daily, Evelina Bucy, DPM, 80 mg at 10/15/21 0819   DAPTOmycin (CUBICIN) 750 mg in sodium chloride 0.9 % IVPB, 8 mg/kg, Intravenous, Q48H, Laurice Record, MD, Last Rate: 130 mL/hr at 10/13/21 2055, 750 mg at 10/13/21 2055   feeding supplement (NEPRO CARB STEADY) liquid 237 mL, 237 mL, Oral, BID BM, Evelina Bucy, DPM, 237 mL at 10/14/21 0944   heparin injection 5,000 Units, 5,000 Units, Subcutaneous, Q8H, Evelina Bucy, DPM, 5,000 Units at 10/15/21 1424   HYDROcodone-acetaminophen (NORCO/VICODIN) 5-325 MG per tablet 1 tablet, 1 tablet, Oral, Q6H PRN, Evelina Bucy, DPM, 1 tablet at 10/15/21 1157   HYDROmorphone (DILAUDID) injection 1 mg, 1 mg, Intravenous, Q4H PRN, Evelina Bucy, DPM, 1 mg at 10/14/21 0521   insulin aspart (novoLOG) injection 0-9 Units, 0-9 Units, Subcutaneous, TID WC, Reome, Earle J, RPH, 5 Units at 10/15/21 1612   multivitamin with minerals tablet 1 tablet, 1 tablet, Oral, Daily, Evelina Bucy, DPM, 1 tablet at 10/15/21 0820   ondansetron (ZOFRAN) tablet 4 mg, 4 mg, Oral, Q6H PRN **OR** ondansetron (ZOFRAN) injection 4 mg, 4 mg, Intravenous, Q6H PRN, Evelina Bucy, DPM   piperacillin-tazobactam (ZOSYN) IVPB 2.25 g, 2.25 g, Intravenous, Q8H, Evelina Bucy, DPM, Last Rate: 100 mL/hr at 10/15/21 1207, 2.25 g at 10/15/21 1207   pregabalin (LYRICA) capsule  75 mg, 75 mg, Oral, BID, Evelina Bucy, DPM, 75 mg at 10/15/21 7782   senna-docusate (Senokot-S) tablet 2 tablet, 2 tablet, Oral, Daily, Evelina Bucy, DPM, 2 tablet at 10/15/21 0820  No Known Allergies   Objective: Today's Vitals   10/15/21 0814 10/15/21 1242 10/15/21 1315 10/15/21 1609  BP: (!) 149/73  (!) 145/68 (!) 152/72  Pulse: 95  100   Resp: 17  16   Temp: 98.9 F  (37.2 C)  98.2 F (36.8 C)   TempSrc: Oral  Oral   SpO2: 99%  100%   Weight:      Height:      PainSc:  Asleep      General: No acute distress  Left lower extremity: Dressing to left foot clean, dry, intact. No strikethrough noted, Upon removal of dressings sutures and staples intact with no dehiscence at metatarsal resection site. Decreased erythema, decreased edema, mild bloody drainage from left medial heel wound upon removal of the packing fatty tissue was noted no active purulence, necrosis at wound edges with no malodor, decreased erythema, minimal edema no tenderness to palpation to the heel.  No calf pain.   MRI Left ankle 1. Deep soft tissue ulceration at the medial aspect of the hindfoot with surrounding soft tissue edema. Ill-defined fluid collection along the superior margin of the ulceration measures 2.3 x 1.1 x 2.2 cm and is most compatible with a phlegmon/developing abscess. 2. Minimal subcortical bone marrow edema within the plantar-medial aspect of the calcaneus at the attachment site of the medial band of the plantar fascia. Marrow signal may be reactive secondary to plantar fasciitis or represent early changes of acute osteomyelitis given proximity to the ulceration. 3. Diffusely increased signal throughout the visualized musculature suggesting myositis. There is an elongated intramuscular abscess within the proximal abductor hallucis muscle measuring 3.7 x 0.8 x 1.7 cm.  Assessment and Plan:  Problem List Items Addressed This Visit       Endocrine   Diabetic foot ulcer ([place])   Relevant Medications   atorvastatin (LIPITOR) tablet 80 mg   insulin aspart (novoLOG) injection 0-9 Units     Other   * (Principal) Sepsis ([place])   Other Visit Diagnoses     Other acute osteomyelitis of left foot ([place])    -  Primary   Relevant Medications   DAPTOmycin (CUBICIN) 750 mg in sodium chloride 0.9 % IVPB   AKI (acute kidney injury) ([place])       Relevant Orders   US  RENAL (Completed)       -Patient seen and evaluated at bedside -Chart and imaging reviewed -MRI suggest deeper abscess along the margin of the ulceration of the left heel.  I have discussed these results with the patient and because there is a deeper abscess he is not a candidate for bedside debridement and I recommend at this time surgical debridement.  Patient is agreeable to the OR for irrigation and debridement of left heel.  Consent to be obtained.  All questions answered.  No guarantees given or implied.  Patient to be n.p.o. at midnight. -Dressing change preformed; cleansed with Dakin's solution and applied dry dressing to left foot -Advised patient to make sure to keep dressing clean, dry, and intact to left foot allowing nursing to change dressings as ordered if there is strike through  -Continue with antibiotics as recommended by infectious disease -Limited weightbearing to heel with cam boot for bedside and bathroom only with assistive device -Continue with  rest and elevation to assist with pain and edema control and heel offloading as previously ordered  -Podiatry will continue to follow closely  -Anticipated discharge plan of care: To home and likely in a few days with antibiotics after repeat surgery and after WBC downtrends, pending continued medical and clinical improvement.    [name], DPM [facility] [phone] office [phone] cell Available via secure chat

## 2021-10-15 NOTE — Plan of Care (Signed)

## 2021-10-15 NOTE — Progress Notes (Signed)
Mobility Specialist Criteria Algorithm Info.    10/15/21 1000  Mobility  Activity Ambulated in hall (in chair before and after ambulation)  Range of Motion/Exercises Active;All extremities  Level of Assistance Standby assist, set-up cues, supervision of patient - no hands on  Assistive Device Front wheel walker  LLE Weight Bearing NWB  LLE Partial Weight Bearing Percentage or Pounds  (heel only)  Distance Ambulated (ft) 60 ft  Mobility Ambulated with assistance in hallway  Mobility Response Tolerated well  Mobility performed by Mobility specialist  Bed Position Chair   Patient received in chair eager to participate. Ambulated in hallway supervision level with steady gait. Was able to adhere to NWB LLE and WB through heel only. Tolerated ambulation well without complaint or incident. Was left in chair with all needs met and family present.    10/15/2021 10:00 AM  [place] Maizey Menendez, [place], [place]  [place]:[phone] Office: [phone]

## 2021-10-15 NOTE — Progress Notes (Signed)
PROGRESS NOTE  [name] JTT:[id number] DOB: 12/21/1950 DOA: 10/10/2021 PCP: [facility]  HPI/Recap of past 24 hours: [name] is a 71 y.o. male with medical history significant of DM2, CKD4, HTN.  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  Who presented to [facility] ED from home with ongoing ulceration of ball area of L foot and worsening renal function.  Followed by podiatry, with concern for osteomyelitis.  Admitted for left foot gas gangrene and AKI on CKD 4.  Post I&D on 10/10/2021 by podiatry Dr March Rummage.  Patient also seen by nephrology, Dr. Hollie Salk.  S/p left foot wound I&D and left first metatarsal resection 1/11.  Continues to spike high fevers despite broad-spectrum IV antibiotics, infectious disease consulted for assistance on 1/12.  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 ID discontinued linezolid and started daptomycin and 1/12. Blood cultures x2: Negative to date. Wound culture 1/9: No group A strep or staph aureus isolated. Left foot abscess culture 1/9: Few Streptococcus anginosus and few Proteus mirabilis, cultures reintubated. Had ongoing fever spikes up to 102.7 and thereby ID was consulted.  MRI Infectious disease consultation appreciated.  MRI of left foot revealed postsurgical changes.  MRI of left ankle shows possible phlegmon/developing abscess 2.3 x 1.1 x 2.2 cm along the superior margin of medial hindfoot ulcer.  Planter fasciitis versus early acute  osteomyelitis.  Myositis.  Elongated IM abscess within the proximal abductor halluces muscle measuring 3.7 x 0.8 x 1.7 cm. Based on above findings, will most likely need further surgical intervention.  Fevers currently defervesced with IV antibiotics.  AKI on CKD 4, the setting of sepsis and recent Bactrim administration, improving Baseline creatinine appears to be 4 Presented with creatinine greater than 5 Nephrology assisted and signed off.  Suspected AKI due to sepsis and recent Bactrim use. UA relatively bland.  Renal ultrasound without obstruction. Creatinine gradually decreasing over the last couple days, down to 4.26 on 1/14. Nonoliguric. Avoid nephrotoxic agents, hypotension and IV contrast unless absolutely necessary. Continue to hold off torsemide/ARB/SGLT2 inhibitors Trend daily BMP. Clinically euvolemic.  Off IV fluids. As per nephrology, has outpatient appointment with Dr. Juleen China on 10/20/2021.  Nephrology recommends holding olmesartan until he sees his nephrologist and can resume home torsemide at discharge.  Nephrology signed off 1/13.  Non anion gap metabolic acidosis in the setting of AKI Continue trending daily BMP.  Bicarb mildly low but stable overall.  Normal anion gap.  Nephrology assisted with care and signed off on 1/13.  Hyponatremia Likely due to AKI.  Trend daily BMP. Stable.  Elevated liver chemistries, unclear etiology Continue to monitor closely Avoid hepatotoxic agents. No significant change, stable  Hypertension No ARB or diuretics at this time. Mildly uncontrolled.  We will start amlodipine 2.5 Mg daily and titrate up as needed.  Type 2 diabetes with hyperglycemia Hemoglobin A1c 8.3 on 10/10/2021. Continue insulin sliding scale, changed from supersensitive to sensitive scale.  Mildly uncontrolled  and fluctuating. Avoid hypoglycemia.  Anemia of chronic disease Drop in hemoglobin 8.4 from 9.8.  Hemoglobin stable in the 8 g range Continue to closely  monitor H&H.    Code Status: Full code Family Communication: Spouse at bedside. Disposition Plan: Pending clinical improvement and clearance by specialist.  Consultants: Nephrology Podiatry  Procedures: Post I&D on 10/10/2021 and 1/11.  Antimicrobials: IV Zosyn 1/8 > IV linezolid-discontinued IV daptomycin 1/12 >  DVT prophylaxis: Subcu heparin 3 times daily  Status is: Inpatient  Patient requires at least 2 midnights for further evaluation and treatment of present condition.  Subjective: No new complaints.  Wanted to know MRI results of his left ankle which were not back this morning.  Objective: Vitals:   10/14/21 2124 10/15/21 0527 10/15/21 0814 10/15/21 1315  BP: (!) 172/80 (!) 174/77 (!) 149/73 (!) 145/68  Pulse: 87 90 95 100  Resp: 16 16 17 16   Temp: 98.4 F (36.9 C) 99.4 F (37.4 C) 98.9 F (37.2 C) 98.2 F (36.8 C)  TempSrc: Oral Oral Oral Oral  SpO2: 100% 99% 99% 100%  Weight:      Height:        Intake/Output Summary (Last 24 hours) at 10/15/2021 1538 Last data filed at 10/15/2021 1414 Gross per 24 hour  Intake --  Output 1650 ml  Net -1650 ml   Filed Weights   10/10/21 0026 10/12/21 1002  Weight: 93 kg 94.3 kg    Exam:  General exam: Middle-age male, moderately built and nourished sitting up comfortably in reclining chair.  Does not look septic or toxic. Respiratory system: Clear to auscultation. Respiratory effort normal. Cardiovascular system: S1 and S2 heard, RRR.  No JVD, murmurs or pedal edema. Gastrointestinal system: Abdomen is nondistended, soft and nontender. No organomegaly or masses felt. Normal bowel sounds heard. Central nervous system: Alert and oriented. No focal neurological deficits. Extremities: Left foot dressing clean and dry. Skin: No rashes, lesions or ulcers Psychiatry: Judgement and insight appear normal. Mood & affect appropriate.     Data Reviewed: CBC: Recent Labs  Lab 10/10/21 0028 10/11/21 0340  10/12/21 0219 10/13/21 0349 10/14/21 0613 10/15/21 0141  WBC 24.7* 23.3* 23.3* 22.5* 18.1* 15.6*  NEUTROABS 20.0*  --   --   --   --   --   HGB 9.8* 8.4* 8.5* 8.1* 9.0* 8.0*  HCT 29.7* 26.2* 26.4* 25.1* 28.2* 25.0*  MCV 87.9 87.0 88.0 87.5 88.7 88.7  PLT 309 291 307 304 358 468   Basic Metabolic Panel: Recent Labs  Lab 10/11/21 0340 10/12/21 0219 10/13/21 0349 10/14/21 0613 10/15/21 0141  NA 132* 129* 131* 133* 133*  K 4.9 4.8 4.7 4.4 4.7  CL 104 103 106 109 106  CO2 20* 17* 18* 20* 17*  GLUCOSE 198* 145* 135* 225* 228*  BUN 39* 42* 40* 40* 40*  CREATININE 4.73* 4.61* 4.84* 4.52* 4.26*  CALCIUM 7.4* 7.3* 7.4* 7.3* 7.6*  PHOS  --  3.5  --   --   --    GFR: Estimated Creatinine Clearance: 19.8 mL/min (A) (by C-G formula based on SCr of 4.26 mg/dL (H)). Liver Function Tests: Recent Labs  Lab 10/10/21 0028 10/12/21 0219 10/13/21 0349  AST 51*  --  69*  ALT 45*  --  55*  ALKPHOS 131*  --  128*  BILITOT 0.3  --  0.5  PROT 8.0  --  6.8  ALBUMIN 2.1* 1.5* <1.5*    HbA1C: No results for input(s): HGBA1C in the  last 72 hours.  CBG: Recent Labs  Lab 10/14/21 1124 10/14/21 1651 10/14/21 2126 10/15/21 0812 10/15/21 1149  GLUCAP 215* 280* 243* 152* 221*   Urine analysis:    Component Value Date/Time   COLORURINE YELLOW 10/10/2021 2033   APPEARANCEUR CLEAR 10/10/2021 2033   LABSPEC 1.015 10/10/2021 2033   PHURINE 6.0 10/10/2021 2033   GLUCOSEU NEGATIVE 10/10/2021 2033   HGBUR MODERATE (A) 10/10/2021 2033   BILIRUBINUR NEGATIVE 10/10/2021 2033   KETONESUR NEGATIVE 10/10/2021 2033   PROTEINUR 100 (A) 10/10/2021 2033   NITRITE NEGATIVE 10/10/2021 2033   LEUKOCYTESUR NEGATIVE 10/10/2021 2033    Recent Results (from the past 240 hour(s))  WOUND CULTURE     Status: Abnormal   Collection Time: 10/06/21  5:02 PM   Specimen: Wound  Result Value Ref Range Status   MICRO NUMBER: [id number]  Final   SPECIMEN QUALITY: Adequate  Final   SOURCE: WOUND (SITE NOT  SPECIFIED)  Final   STATUS: FINAL  Final   GRAM STAIN:   Final    Few Polymorphonuclear leukocytes No epithelial cells seen Few Gram positive cocci in pairs Few Gram negative bacilli   ISOLATE 1: Citrobacter koseri (A)  Final    Comment: Heavy growth of Citrobacter koseri      Susceptibility   Citrobacter koseri - AEROBIC CULT, GRAM STAIN NEGATIVE 1    AMOX/CLAVULANIC <=2 Sensitive     CEFAZOLIN* <=4 Not Reportable      * For infections other than uncomplicated UTI caused by E. coli, K. pneumoniae or P. mirabilis: Cefazolin is resistant if MIC > or = 8 mcg/mL. (Distinguishing susceptible versus intermediate for isolates with MIC < or = 4 mcg/mL requires additional testing.)     CEFTAZIDIME <=1 Sensitive     CEFEPIME <=1 Sensitive     CEFTRIAXONE <=1 Sensitive     CIPROFLOXACIN <=0.25 Sensitive     LEVOFLOXACIN <=0.12 Sensitive     GENTAMICIN <=1 Sensitive     IMIPENEM 1 Sensitive     PIP/TAZO <=4 Sensitive     TOBRAMYCIN <=1 Sensitive     TRIMETH/SULFA* <=20 Sensitive      * For infections other than uncomplicated UTI caused by E. coli, K. pneumoniae or P. mirabilis: Cefazolin is resistant if MIC > or = 8 mcg/mL. (Distinguishing susceptible versus intermediate for isolates with MIC < or = 4 mcg/mL requires additional testing.) Legend: S = Susceptible  I = Intermediate R = Resistant  NS = Not susceptible * = Not tested  NR = Not reported **NN = See antimicrobic comments   Aerobic/Anaerobic Culture w Gram Stain (surgical/deep wound)     Status: None (Preliminary result)   Collection Time: 10/10/21 12:20 AM   Specimen: PATH Other; Tissue  Result Value Ref Range Status   Specimen Description WOUND  Final   Special Requests FIRST METATARSAL BONE  Final   Gram Stain NO WBC SEEN NO ORGANISMS SEEN   Final   Culture   Final    NO GROWTH 3 DAYS NO ANAEROBES ISOLATED; CULTURE IN PROGRESS FOR 5 DAYS Performed at [facility], [place] [address], [place], [place] [address]     Report Status PENDING  Incomplete  Resp Panel by RT-PCR (Flu A&B, Covid) Nasopharyngeal Swab     Status: None   Collection Time: 10/10/21 12:28 AM   Specimen: Nasopharyngeal Swab; Nasopharyngeal(NP) swabs in vial transport medium  Result Value Ref Range Status   SARS Coronavirus 2 by RT PCR NEGATIVE NEGATIVE  Final    Comment: (NOTE) SARS-CoV-2 target nucleic acids are NOT DETECTED.  The SARS-CoV-2 RNA is generally detectable in upper respiratory specimens during the acute phase of infection. The lowest concentration of SARS-CoV-2 viral copies this assay can detect is 138 copies/mL. A negative result does not preclude SARS-Cov-2 infection and should not be used as the sole basis for treatment or other patient management decisions. A negative result may occur with  improper specimen collection/handling, submission of specimen other than nasopharyngeal swab, presence of viral mutation(s) within the areas targeted by this assay, and inadequate number of viral copies(<138 copies/mL). A negative result must be combined with clinical observations, patient history, and epidemiological information. The expected result is Negative.  Fact Sheet for Patients:  EntrepreneurPulse.com.au  Fact Sheet for Healthcare Providers:  IncredibleEmployment.be  This test is no t yet approved or cleared by the [place] FDA and  has been authorized for detection and/or diagnosis of SARS-CoV-2 by FDA under an Emergency Use Authorization (EUA). This EUA will remain  in effect (meaning this test can be used) for the duration of the COVID-19 declaration under Section 564(b)(1) of the Act, 21 U.S.C.section 360bbb-3(b)(1), unless the authorization is terminated  or revoked sooner.       Influenza A by PCR NEGATIVE NEGATIVE Final   Influenza B by PCR NEGATIVE NEGATIVE Final    Comment: (NOTE) The Xpert Xpress SARS-CoV-2/FLU/RSV plus assay is intended as an aid in the  diagnosis of influenza from Nasopharyngeal swab specimens and should not be used as a sole basis for treatment. Nasal washings and aspirates are unacceptable for Xpert Xpress SARS-CoV-2/FLU/RSV testing.  Fact Sheet for Patients: EntrepreneurPulse.com.au  Fact Sheet for Healthcare Providers: IncredibleEmployment.be  This test is not yet approved or cleared by the [place] FDA and has been authorized for detection and/or diagnosis of SARS-CoV-2 by FDA under an Emergency Use Authorization (EUA). This EUA will remain in effect (meaning this test can be used) for the duration of the COVID-19 declaration under Section 564(b)(1) of the Act, 21 U.S.C. section 360bbb-3(b)(1), unless the authorization is terminated or revoked.  Performed at [facility], [place] [address], [place], [place] [address]   Blood culture (routine x 2)     Status: None   Collection Time: 10/10/21  1:00 AM   Specimen: BLOOD  Result Value Ref Range Status   Specimen Description BLOOD LEFT ARM  Final   Special Requests   Final    BOTTLES DRAWN AEROBIC AND ANAEROBIC Blood Culture adequate volume   Culture   Final    NO GROWTH 5 DAYS Performed at [facility], [place] [address], [place], [place] [address]    Report Status 10/15/2021 FINAL  Final  Blood culture (routine x 2)     Status: None   Collection Time: 10/10/21  1:05 AM   Specimen: BLOOD  Result Value Ref Range Status   Specimen Description BLOOD RIGHT ARM  Final   Special Requests   Final    BOTTLES DRAWN AEROBIC AND ANAEROBIC Blood Culture results may not be optimal due to an inadequate volume of blood received in culture bottles   Culture   Final    NO GROWTH 5 DAYS Performed at [facility], [place] [address] [place], [place] [address]    Report Status 10/15/2021 FINAL  Final  Aerobic Culture w Gram Stain (superficial specimen)     Status: None   Collection Time: 10/10/21  6:24 AM   Specimen:  Wound  Result Value Ref Range Status   Specimen Description WOUND  Final   Special Requests SINOSP  Final   Gram Stain   Final    ABUNDANT WBC PRESENT, PREDOMINANTLY PMN MODERATE GRAM POSITIVE COCCI    Culture   Final    RARE MULTIPLE ORGANISMS PRESENT, NONE PREDOMINANT NO GROUP A STREP (S.PYOGENES) ISOLATED NO STAPHYLOCOCCUS AUREUS ISOLATED Performed at [facility], [address] [place], [place] [address]    Report Status 10/12/2021 FINAL  Final  Aerobic/Anaerobic Culture w Gram Stain (surgical/deep wound)     Status: None (Preliminary result)   Collection Time: 10/10/21  9:49 PM   Specimen: Abscess; Wound  Result Value Ref Range Status   Specimen Description ABSCESS LEFT FOOT  Final   Special Requests NONE  Final   Gram Stain   Final    FEW WBC PRESENT, PREDOMINANTLY PMN FEW GRAM POSITIVE COCCI IN CHAINS    Culture   Final    FEW STREPTOCOCCUS ANGINOSIS FEW PROTEUS MIRABILIS FEW PREVOTELLA BIVIA BETA LACTAMASE POSITIVE Performed at [facility], [address], [place], [place] [address]    Report Status PENDING  Incomplete   Organism ID, Bacteria STREPTOCOCCUS ANGINOSIS  Final   Organism ID, Bacteria PROTEUS MIRABILIS  Final      Susceptibility   Proteus mirabilis - MIC*    AMPICILLIN <=2 SENSITIVE Sensitive     CEFAZOLIN 8 SENSITIVE Sensitive     CEFEPIME <=0.12 SENSITIVE Sensitive     CEFTAZIDIME <=1 SENSITIVE Sensitive     CEFTRIAXONE <=0.25 SENSITIVE Sensitive     CIPROFLOXACIN <=0.25 SENSITIVE Sensitive     GENTAMICIN <=1 SENSITIVE Sensitive     IMIPENEM 2 SENSITIVE Sensitive     TRIMETH/SULFA <=20 SENSITIVE Sensitive     AMPICILLIN/SULBACTAM <=2 SENSITIVE Sensitive     PIP/TAZO <=4 SENSITIVE Sensitive     * FEW PROTEUS MIRABILIS   Streptococcus anginosis - MIC*    PENICILLIN <=0.06 SENSITIVE Sensitive     CEFTRIAXONE <=0.12 SENSITIVE Sensitive     ERYTHROMYCIN <=0.12 SENSITIVE Sensitive     LEVOFLOXACIN 1 SENSITIVE Sensitive      VANCOMYCIN 0.5 SENSITIVE Sensitive     * FEW STREPTOCOCCUS ANGINOSIS  MRSA Next Gen by PCR, Nasal     Status: None   Collection Time: 10/11/21  5:54 AM   Specimen: Nasal Mucosa; Nasal Swab  Result Value Ref Range Status   MRSA by PCR Next Gen NOT DETECTED NOT DETECTED Final    Comment: (NOTE) The GeneXpert MRSA Assay (FDA approved for NASAL specimens only), is one component of a comprehensive MRSA colonization surveillance program. It is not intended to diagnose MRSA infection nor to guide or monitor treatment for MRSA infections. Test performance is not FDA approved in patients less than 76 years old. Performed at [facility], [place] [address], [place], [place] [address]       Studies: MR ANKLE LEFT WO CONTRAST  Result Date: 10/15/2021 CLINICAL DATA:  Diabetic foot ulcer EXAM: MRI OF THE LEFT ANKLE WITHOUT CONTRAST TECHNIQUE: Multiplanar, multisequence MR imaging of the ankle was performed. No intravenous contrast was administered. COMPARISON:  X-ray 10/10/2021.  MRI forefoot 10/14/2021 FINDINGS: TENDONS Peroneal: Peroneus longus and brevis tendons are intact and normally positioned. Posteromedial: Tibialis posterior, flexor hallucis longus, and flexor digitorum longus tendons are intact and normally positioned. Anterior: Tibialis anterior, extensor hallucis longus, and extensor digitorum longus tendons are intact and normally positioned. Achilles: Intact. Plantar Fascia: Thickening of  the central band of the plantar fascia without definite tear. LIGAMENTS Lateral: Intact tibiofibular ligaments. The anterior and posterior talofibular ligaments are intact. Intact calcaneofibular ligament. Medial: The deltoid and visualized portions of the spring ligament appear intact. CARTILAGE AND BONES Ankle Joint: No tibiotalar joint effusion. The talar dome and tibial plafond are intact. Subtalar Joints/Sinus Tarsi: No cartilage defect. No effusion. Preservation of the anatomic fat within the sinus  tarsi. Bones: Minimal subcortical bone marrow edema within the plantar-medial aspect of the calcaneus at the attachment site of the medial band of the plantar fascia (series 3, image 20). No confluent low T1 marrow signal. No focal erosion. The remaining osseous structures demonstrate preserved bone marrow signal. No fracture or malalignment. Mild midfoot arthropathy. Other: Deep soft tissue ulceration at the medial aspect of the hindfoot. Surrounding soft tissue edema at the ulcer base. Ill-defined fluid collection along the superior margin of the ulceration measures approximately 2.3 x 1.1 x 2.2 cm (series 3, image 20). Additionally there is a elongated fluid collection within the proximal abductor hallucis muscle measuring 3.7 x 0.8 x 1.7 cm (series 9, image 14). Diffusely increased T2 signal throughout the visualized lower leg and foot musculature. IMPRESSION: 1. Deep soft tissue ulceration at the medial aspect of the hindfoot with surrounding soft tissue edema. Ill-defined fluid collection along the superior margin of the ulceration measures 2.3 x 1.1 x 2.2 cm and is most compatible with a phlegmon/developing abscess. 2. Minimal subcortical bone marrow edema within the plantar-medial aspect of the calcaneus at the attachment site of the medial band of the plantar fascia. Marrow signal may be reactive secondary to plantar fasciitis or represent early changes of acute osteomyelitis given proximity to the ulceration. 3. Diffusely increased signal throughout the visualized musculature suggesting myositis. There is an elongated intramuscular abscess within the proximal abductor hallucis muscle measuring 3.7 x 0.8 x 1.7 cm. Electronically Signed   By: Davina Poke D.O.   On: 10/15/2021 13:08    Scheduled Meds:  (feeding supplement) PROSource Plus  30 mL Oral BID BM   atorvastatin  80 mg Oral Daily   feeding supplement (NEPRO CARB STEADY)  237 mL Oral BID BM   heparin injection (subcutaneous)  5,000 Units  Subcutaneous Q8H   insulin aspart  0-9 Units Subcutaneous TID WC   multivitamin with minerals  1 tablet Oral Daily   pregabalin  75 mg Oral BID   senna-docusate  2 tablet Oral Daily    Continuous Infusions:  sodium chloride Stopped (10/12/21 1015)   DAPTOmycin (CUBICIN)  IV 750 mg (10/13/21 2055)   piperacillin-tazobactam (ZOSYN)  IV 2.25 g (10/15/21 1207)     LOS: 5 days    Vernell Leep, MD,  FACP, [facility], [facility], [facility] (Care Management Physician Certified) [place]  To contact the attending provider between 7A-7P or the covering provider during after hours 7P-7A, please log into the web site [email or web address] and access using universal  password for that web site. If you do not have the password, please call the hospital operator.

## 2021-10-16 ENCOUNTER — Encounter (HOSPITAL_COMMUNITY)
Admission: EM | Disposition: A | Discharge status: Home Health | Payer: Self-pay | Source: Home / Self Care | Attending: Internal Medicine

## 2021-10-16 ENCOUNTER — Encounter (HOSPITAL_COMMUNITY): Payer: Self-pay | Admitting: Internal Medicine

## 2021-10-16 ENCOUNTER — Inpatient Hospital Stay (HOSPITAL_COMMUNITY): Payer: Medicare HMO | Admitting: Certified Registered"

## 2021-10-16 DIAGNOSIS — N189 Chronic kidney disease, unspecified: Secondary | ICD-10-CM | POA: Diagnosis not present

## 2021-10-16 DIAGNOSIS — L02612 Cutaneous abscess of left foot: Secondary | ICD-10-CM

## 2021-10-16 DIAGNOSIS — N179 Acute kidney failure, unspecified: Secondary | ICD-10-CM | POA: Diagnosis not present

## 2021-10-16 DIAGNOSIS — M86172 Other acute osteomyelitis, left ankle and foot: Secondary | ICD-10-CM

## 2021-10-16 DIAGNOSIS — E875 Hyperkalemia: Secondary | ICD-10-CM

## 2021-10-16 HISTORY — PX: IRRIGATION AND DEBRIDEMENT FOOT: SHX6602

## 2021-10-16 LAB — COMPREHENSIVE METABOLIC PANEL
ALT: 138 U/L — ABNORMAL HIGH (ref 0–44)
AST: 140 U/L — ABNORMAL HIGH (ref 15–41)
Albumin: <1.5 g/dL — ABNORMAL LOW (ref 3.5–5.0)
Alkaline Phosphatase: 205 U/L — ABNORMAL HIGH (ref 38–126)
Anion gap: 6 (ref 5–15)
BUN: 42 mg/dL — ABNORMAL HIGH (ref 8–23)
CO2: 18 mmol/L — ABNORMAL LOW (ref 22–32)
Calcium: 7.7 mg/dL — ABNORMAL LOW (ref 8.9–10.3)
Chloride: 107 mmol/L (ref 98–111)
Creatinine, Ser: 4.63 mg/dL — ABNORMAL HIGH (ref 0.61–1.24)
GFR, Estimated: 13 mL/min — ABNORMAL LOW (ref >60)
Glucose, Bld: 241 mg/dL — ABNORMAL HIGH (ref 70–99)
Potassium: 5.2 mmol/L — ABNORMAL HIGH (ref 3.5–5.1)
Sodium: 131 mmol/L — ABNORMAL LOW (ref 135–145)
Total Bilirubin: 0.2 mg/dL — ABNORMAL LOW (ref 0.3–1.2)
Total Protein: 7.1 g/dL (ref 6.5–8.1)

## 2021-10-16 LAB — AEROBIC/ANAEROBIC CULTURE W GRAM STAIN (SURGICAL/DEEP WOUND)

## 2021-10-16 LAB — GLUCOSE, CAPILLARY
Glucose-Capillary: 154 mg/dL — ABNORMAL HIGH (ref 70–99)
Glucose-Capillary: 135 mg/dL — ABNORMAL HIGH (ref 70–99)
Glucose-Capillary: 125 mg/dL — ABNORMAL HIGH (ref 70–99)
Glucose-Capillary: 303 mg/dL — ABNORMAL HIGH (ref 70–99)
Glucose-Capillary: 264 mg/dL — ABNORMAL HIGH (ref 70–99)

## 2021-10-16 SURGERY — IRRIGATION AND DEBRIDEMENT FOOT
Anesthesia: Monitor Anesthesia Care | Site: Heel | Laterality: Left

## 2021-10-16 MED ORDER — BUPIVACAINE HCL 0.25 % IJ SOLN
INTRAMUSCULAR | Status: DC | PRN
Start: 2021-10-16 — End: 2021-10-16
  Administered 2021-10-16 (×2): 5 mL

## 2021-10-16 MED ORDER — INSULIN ASPART 100 UNIT/ML IJ SOLN
0.0000 [IU] | Freq: Every day | INTRAMUSCULAR | Status: DC
  Administered 2021-10-16 – 2021-10-17 (×2): 3 [IU] via SUBCUTANEOUS
  Administered 2021-10-18: 4 [IU] via SUBCUTANEOUS
  Administered 2021-10-19: 2 [IU] via SUBCUTANEOUS

## 2021-10-16 MED ORDER — LIDOCAINE HCL 1 % IJ SOLN
INTRAMUSCULAR | Status: DC | PRN
  Administered 2021-10-16 (×2): 5 mL

## 2021-10-16 MED ORDER — SODIUM BICARBONATE 650 MG PO TABS
1300.0000 mg | ORAL_TABLET | Freq: Two times a day (BID) | ORAL | Status: DC
  Administered 2021-10-16 – 2021-10-20 (×9): 1300 mg via ORAL
  Filled 2021-10-16 (×10): qty 2

## 2021-10-16 MED ORDER — INSULIN DETEMIR 100 UNIT/ML ~~LOC~~ SOLN
5.0000 [IU] | Freq: Every day | SUBCUTANEOUS | Status: DC
  Administered 2021-10-16: 5 [IU] via SUBCUTANEOUS
  Filled 2021-10-16 (×2): qty 0.05

## 2021-10-16 MED ORDER — LIDOCAINE 2% (20 MG/ML) 5 ML SYRINGE
INTRAMUSCULAR | Status: AC
  Filled 2021-10-16: qty 5

## 2021-10-16 MED ORDER — MEPERIDINE HCL 25 MG/ML IJ SOLN: 6.2500 mg | INTRAMUSCULAR | Status: DC | PRN

## 2021-10-16 MED ORDER — 0.9 % SODIUM CHLORIDE (POUR BTL) OPTIME
TOPICAL | Status: DC | PRN
  Administered 2021-10-16: 1000 mL

## 2021-10-16 MED ORDER — BUPIVACAINE HCL (PF) 0.25 % IJ SOLN
INTRAMUSCULAR | Status: AC
  Filled 2021-10-16: qty 10

## 2021-10-16 MED ORDER — PROMETHAZINE HCL 25 MG/ML IJ SOLN: 6.2500 mg | INTRAMUSCULAR | Status: DC | PRN

## 2021-10-16 MED ORDER — LIDOCAINE HCL (CARDIAC) PF 100 MG/5ML IV SOSY
PREFILLED_SYRINGE | INTRAVENOUS | Status: DC | PRN
  Administered 2021-10-16: 100 mg via INTRATRACHEAL

## 2021-10-16 MED ORDER — LIDOCAINE HCL 1 % IJ SOLN
INTRAMUSCULAR | Status: AC
  Filled 2021-10-16: qty 20

## 2021-10-16 MED ORDER — PROPOFOL 500 MG/50ML IV EMUL
INTRAVENOUS | Status: DC | PRN
  Administered 2021-10-16: 200 ug/kg/min via INTRAVENOUS

## 2021-10-16 MED ORDER — SODIUM ZIRCONIUM CYCLOSILICATE 10 G PO PACK
10.0000 g | PACK | Freq: Once | ORAL | Status: AC
  Administered 2021-10-16: 10 g via ORAL
  Filled 2021-10-16: qty 1

## 2021-10-16 MED ORDER — INSULIN ASPART 100 UNIT/ML IJ SOLN
0.0000 [IU] | Freq: Three times a day (TID) | INTRAMUSCULAR | Status: DC
  Administered 2021-10-16: 8 [IU] via SUBCUTANEOUS
  Administered 2021-10-16 – 2021-10-17 (×2): 2 [IU] via SUBCUTANEOUS
  Administered 2021-10-17 (×2): 5 [IU] via SUBCUTANEOUS
  Administered 2021-10-18 (×2): 2 [IU] via SUBCUTANEOUS
  Administered 2021-10-18: 7 [IU] via SUBCUTANEOUS
  Administered 2021-10-19: 3 [IU] via SUBCUTANEOUS
  Administered 2021-10-19: 1 [IU] via SUBCUTANEOUS
  Administered 2021-10-19 – 2021-10-20 (×2): 3 [IU] via SUBCUTANEOUS

## 2021-10-16 MED ORDER — AMLODIPINE BESYLATE 5 MG PO TABS
5.0000 mg | ORAL_TABLET | Freq: Every day | ORAL | Status: DC
  Administered 2021-10-16 – 2021-10-18 (×3): 5 mg via ORAL
  Filled 2021-10-16 (×3): qty 1

## 2021-10-16 MED ORDER — FENTANYL CITRATE (PF) 100 MCG/2ML IJ SOLN: 25.0000 ug | INTRAMUSCULAR | Status: DC | PRN

## 2021-10-16 MED ORDER — CHLORHEXIDINE GLUCONATE 0.12 % MT SOLN
OROMUCOSAL | Status: AC
  Administered 2021-10-16: 15 mL via OROMUCOSAL
  Filled 2021-10-16: qty 15

## 2021-10-16 MED ORDER — CHLORHEXIDINE GLUCONATE 0.12 % MT SOLN
15.0000 mL | Freq: Once | OROMUCOSAL | Status: AC
  Filled 2021-10-16: qty 15

## 2021-10-16 MED ORDER — SODIUM CHLORIDE 0.9 % IV SOLN: INTRAVENOUS | Status: DC | PRN

## 2021-10-16 MED ORDER — SODIUM CHLORIDE 0.9 % IR SOLN
Status: DC | PRN
  Administered 2021-10-16: 1000 mL

## 2021-10-16 SURGICAL SUPPLY — 42 items
BAG COUNTER SPONGE SURGICOUNT (BAG) ×2 IMPLANT
BENZOIN TINCTURE PRP APPL 2/3 (GAUZE/BANDAGES/DRESSINGS) ×1 IMPLANT
BLADE SURG 15 STRL LF DISP TIS (BLADE) ×2 IMPLANT
BLADE SURG 15 STRL SS (BLADE) ×1
BNDG COHESIVE 4X5 TAN ST LF (GAUZE/BANDAGES/DRESSINGS) ×1 IMPLANT
BNDG ELASTIC 4X5.8 VLCR STR LF (GAUZE/BANDAGES/DRESSINGS) ×2 IMPLANT
BNDG ESMARK 4X9 LF (GAUZE/BANDAGES/DRESSINGS) ×2 IMPLANT
BNDG GAUZE ELAST 4 BULKY (GAUZE/BANDAGES/DRESSINGS) ×3 IMPLANT
CANISTER SUCT 3000ML PPV (MISCELLANEOUS) ×1 IMPLANT
CHLORAPREP W/TINT 26 (MISCELLANEOUS) ×1 IMPLANT
CNTNR URN SCR LID CUP LEK RST (MISCELLANEOUS) ×1 IMPLANT
CONT SPEC 4OZ STRL OR WHT (MISCELLANEOUS) ×1
COVER SURGICAL LIGHT HANDLE (MISCELLANEOUS) ×3 IMPLANT
CUFF TOURN SGL QUICK 18X4 (TOURNIQUET CUFF) ×2 IMPLANT
DRAPE SURG 17X23 STRL (DRAPES) ×1 IMPLANT
DRSG PAD ABDOMINAL 8X10 ST (GAUZE/BANDAGES/DRESSINGS) ×2 IMPLANT
ELECT NDL TIP 2.8 STRL (NEEDLE) IMPLANT
ELECT NEEDLE TIP 2.8 STRL (NEEDLE) IMPLANT
ELECT REM PT RETURN 9FT ADLT (ELECTROSURGICAL) ×2
ELECTRODE REM PT RTRN 9FT ADLT (ELECTROSURGICAL) IMPLANT
GAUZE PACKING IODOFORM 1/4X15 (PACKING) ×1 IMPLANT
GAUZE SPONGE 4X4 12PLY STRL (GAUZE/BANDAGES/DRESSINGS) ×2 IMPLANT
GLOVE SURG ENC MOIS LTX SZ6.5 (GLOVE) ×2 IMPLANT
GLOVE SURG UNDER POLY LF SZ6.5 (GLOVE) ×2 IMPLANT
GOWN STRL REUS W/ TWL LRG LVL3 (GOWN DISPOSABLE) ×2 IMPLANT
GOWN STRL REUS W/TWL LRG LVL3 (GOWN DISPOSABLE) ×2
KIT BASIN OR (CUSTOM PROCEDURE TRAY) ×2 IMPLANT
KIT TURNOVER KIT B (KITS) ×2 IMPLANT
NDL BIOPSY JAMSHIDI 8X6 (NEEDLE) IMPLANT
NDL HYPO 25GX1X1/2 BEV (NEEDLE) ×1 IMPLANT
NEEDLE BIOPSY JAMSHIDI 8X6 (NEEDLE) ×2 IMPLANT
NEEDLE HYPO 25GX1X1/2 BEV (NEEDLE) ×4 IMPLANT
NS IRRIG 1000ML POUR BTL (IV SOLUTION) ×2 IMPLANT
PACK ORTHO EXTREMITY (CUSTOM PROCEDURE TRAY) ×2 IMPLANT
PAD ARMBOARD 7.5X6 YLW CONV (MISCELLANEOUS) ×3 IMPLANT
PROBE DEBRIDE SONICVAC MISONIX (TIP) ×1 IMPLANT
STRIP CLOSURE SKIN 1/2X4 (GAUZE/BANDAGES/DRESSINGS) ×1 IMPLANT
SYR CONTROL 10ML LL (SYRINGE) ×3 IMPLANT
TOWEL GREEN STERILE (TOWEL DISPOSABLE) ×2 IMPLANT
TOWEL GREEN STERILE FF (TOWEL DISPOSABLE) ×2 IMPLANT
TUBE IRRIGATION SET MISONIX (TUBING) ×1 IMPLANT
YANKAUER SUCT BULB TIP NO VENT (SUCTIONS) ×1 IMPLANT

## 2021-10-16 NOTE — Op Note (Signed)
Date of Surgery: 15/Jan/2023  Surgeon: Dr. [name], DPM  Assistants: none   Pre-operative Diagnosis: Left heel wound with abscess and probable osteomyelitis  Post-operative Diagnosis:  Other acute osteomyelitis of left foot ([place]) Sepsis, due to unspecified organism, unspecified whether acute organ dysfunction present ([place]) AKI (acute kidney injury) ([place]) Sepsis ([place]) Diabetic ulcer of other part of left foot associated with type 2 diabetes mellitus, with necrosis of bone ([place])  Procedures:             1) left heel wound debridement with irrigation             2) left heel bone biopsy    Anesthesia: Monitored anesthesia care with local  Hemostasis: Ankle tourniquet   Estimated Blood Loss: 30 cc  Materials: Iodoform packing  Medications: Lidocaine Marcaine local anesthetic  Complications: None   Indications for Procedure:  This is a 71 y.o. diabetic male patient with a history of left foot infection has underwent 2 previous serial incision and drainage procedures with removal of infected first metatarsal bone and heel debridement performed by Dr. March Rummage on 1/9 and 1/11.  Patient still was noted to have a significantly elevated white blood cell count a repeat MRI was obtained which revealed residual infection at the heel of abscess, myositis, and possible acute osteomyelitis at the calcaneus.  Treatment alternatives were discussed with patient.  Risk and benefits explained no guarantees given.  Patient is aware that due to severity of his infection he is at risk of nonhealing loss of limb/amputation or loss of life.  Consent was obtained all questions answered to patient's satisfaction.  N.p.o. status confirmed.    Procedure in Detail: Patient was identified in pre-operative holding area. Formal consent was signed and the left lower extremity was marked. Patient was brought back to the operating room. Anesthesia was induced.  A preoperative block was administered.  The  extremity was prepped and draped in the usual sterile fashion. Timeout was taken to confirm patient name, laterality, and procedure prior to incision.    Attention was then directed to the left heel where there is of note a previous wound with fibronecrotic tissue using a 15 blade the wound was sharply excised to healthy margins and all nonviable tissue was removed down to the level of fat then utilizing a tenotomy scissor the tendon to the abductor hallucis muscle was transected as well as the muscle belly itself was removed from the wound bed due to none viable status and changes on MRI that suggests underlying infection of the muscle belly.  Following further dissection was carried out to the level of the bone using a deep 15 blade once the bone was identified using a Jamshidi needle bone samples were obtained to be sent to pathology for further analysis to evaluate for acute osteomyelitis.  The wound bed was then thoroughly irrigated and debrided using my sonics ultrasound debridement device.  The left heel wound at the medial aspect postdebridement measured 4.5 x 4.5 x 3 cm all the way to bone.  After debridement the wound bed was inspected there was no purulent drainage no malodor healthier appearing tissue was noted the wound bed.  Postoperative block was administered.  The wound was then irrigated and dressed with quarter inch iodoform packing 4 x 4 gauze ABD pad Kerlix Coban and Ace wrap.  The patient tolerated the procedure and anesthesia well without complication.      Disposition and plan of care: Following a period of post-operative monitoring,  patient will be transferred back to the medical floor.  We will plan for wound care and possible wound VAC placement in 1 to 2 days to the left heel and continuing antibiotics.  At this time patient does not want amputation but knows and understands that there is risk.  Podiatry to follow.  Dr. [name] [facility] [phone]  office [phone] cell Available via secure chat

## 2021-10-16 NOTE — Transfer of Care (Signed)
Immediate Anesthesia Transfer of Care Note  Patient: [name]  Procedure(s) Performed: IRRIGATION AND DEBRIDEMENT FOOT (Left: Heel)  Patient Location: PACU  Anesthesia Type:MAC  Level of Consciousness: oriented, drowsy, patient cooperative and responds to stimulation  Airway & Oxygen Therapy: Patient Spontanous Breathing  Post-op Assessment: Report given to RN, Post -op Vital signs reviewed and stable and Patient moving all extremities X 4  Post vital signs: Reviewed and stable  Last Vitals:  Vitals Value Taken Time  BP 137/82 10/16/21 1156  Temp    Pulse 72 10/16/21 1157  Resp 12 10/16/21 1157  SpO2 99 % 10/16/21 1157  Vitals shown include unvalidated device data.  Last Pain:  Vitals:   10/16/21 0954  TempSrc: Oral  PainSc: 0-No pain      Patients Stated Pain Goal: 2 (15/37/94 3276)  Complications: No notable events documented.

## 2021-10-16 NOTE — Anesthesia Procedure Notes (Signed)
Procedure Name: MAC Date/Time: 10/16/2021 10:42 AM Performed by: Claris Che, CRNA Pre-anesthesia Checklist: Patient identified, Emergency Drugs available, Suction available, Patient being monitored and Timeout performed Patient Re-evaluated:Patient Re-evaluated prior to induction Oxygen Delivery Method: Simple face mask Ventilation: Oral airway inserted - appropriate to patient size Placement Confirmation: CO2 detector Dental Injury: Teeth and Oropharynx as per pre-operative assessment

## 2021-10-16 NOTE — Progress Notes (Signed)
PROGRESS NOTE  [name] ZGY:[id number] DOB: [birth year] DOA: 10/10/2021 PCP: [facility]  HPI/Recap of past 24 hours: [name] is a 71 y.o. male with medical history significant of DM2, CKD4, HTN.  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  Who presented to [facility] ED from home with ongoing ulceration of ball area of L foot and worsening renal function.  Followed by podiatry, with concern for osteomyelitis.  Admitted for left foot gas gangrene and AKI on CKD 4.  Post I&D on 10/10/2021 by podiatry Dr March Rummage.  Patient also seen by nephrology, Dr. Hollie Salk.  S/p left foot wound I&D and left first metatarsal resection 1/11.  Continues to spike high fevers despite broad-spectrum IV antibiotics, infectious disease consulted for assistance on 1/12.  S/p left heel wound debridement with irrigation and left heel bone biopsy 1/15.  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 ID discontinued linezolid and started daptomycin and 1/12. Blood cultures x2: Negative to date. Wound culture 1/9: No group A strep or staph aureus isolated. Left foot abscess culture 1/9: Few Streptococcus anginosus and few Proteus mirabilis, cultures reintubated. Had ongoing fever spikes up to 102.7 and thereby ID was consulted.  MRI Infectious disease consultation appreciated.  MRI of left foot revealed postsurgical changes.  MRI of left ankle shows possible phlegmon/developing abscess 2.3 x 1.1 x 2.2 cm along the  superior margin of medial hindfoot ulcer.  Planter fasciitis versus early acute osteomyelitis.  Myositis.  Elongated IM abscess within the proximal abductor halluces muscle measuring 3.7 x 0.8 x 1.7 cm. S/p left heel wound I&D and left heel bone biopsy 1/15 ID recommended orthopedic consultation for calcaneal osteomyelitis.  However podiatry discussed with patient and he does not want to lose the leg or have a amputation at this time but he is aware that there are risks.  Current plan is for long-term antibiotics and wound care.   AKI on CKD 4, the setting of sepsis and recent Bactrim administration, improving Baseline creatinine appears to be 4 Presented with creatinine greater than 5 Nephrology assisted and signed off.  Suspected AKI due to sepsis and recent Bactrim use. UA relatively bland.  Renal ultrasound without obstruction. Creatinine had peaked at 5.25 on 1/9.  Since 1/10, creatinine improved slightly but has plateaued in the high 4 range. Avoid nephrotoxic agents, hypotension and IV contrast unless absolutely necessary. Continue to hold off torsemide/ARB/SGLT2 inhibitors Trend daily BMP. Clinically euvolemic.  Off IV fluids. As per nephrology, has outpatient appointment with Dr. Juleen China on 10/20/2021.  Nephrology recommends holding olmesartan until he sees his nephrologist and can resume home torsemide at discharge.  Nephrology signed off 1/13.  Non anion gap metabolic acidosis in the setting of AKI Continue trending daily BMP.  Bicarb mildly low but stable overall.  Normal anion gap.  Nephrology assisted with care and signed off on 1/13.  Started sodium bicarbonate tablets.  Hyperkalemia: Mild.  Lokelma 10 g x 1  dose.  Initiated sodium bicarb.  Follow BMP in AM.  Hyponatremia Likely due to AKI.  Trend daily BMP. Stable.  Elevated liver chemistries, unclear etiology Higher than on 1/12.?  Related to daptomycin.  Continue to monitor LFTs daily.  Hypertension No ARB or diuretics  at this time. Mildly uncontrolled.  Increased amlodipine to 5 Mg daily (had not gotten his a.m. meds yet).  Type 2 diabetes with hyperglycemia Hemoglobin A1c 8.3 on 10/10/2021. Uncontrolled.  Started Levemir 5 units daily, continue NovoLog SSI with bedtime scale.  Anemia of chronic disease Drop in hemoglobin 8.4 from 9.8.  Hemoglobin stable in the 8 g range Continue to closely monitor H&H.    Code Status: Full code Family Communication: Spouse at bedside. Disposition Plan: Pending clinical improvement and clearance by specialist.  Consultants: Nephrology Podiatry  Procedures: Post I&D on 10/10/2021 and 1/11. I&D of left heel wound and left heel bone biopsy 1/15  Antimicrobials: IV Zosyn 1/8 > IV linezolid-discontinued IV daptomycin 1/12 >  DVT prophylaxis: Subcu heparin 3 times daily  Status is: Inpatient  Patient requires at least 2 midnights for further evaluation and treatment of present condition.  Subjective: Seen this morning prior to procedure.  No complaints.  No pain.  Objective: Vitals:   10/16/21 1156 10/16/21 1210 10/16/21 1225 10/16/21 1300  BP: 137/82 (!) 158/81 (!) 157/83 (!) 187/81  Pulse: 74 65 77 76  Resp: 17 13 18 20   Temp: 97.7 F (36.5 C)  98 F (36.7 C) 97.6 F (36.4 C)  TempSrc:      SpO2: 99% 100% 100%   Weight:      Height:        Intake/Output Summary (Last 24 hours) at 10/16/2021 1334 Last data filed at 10/16/2021 1156 Gross per 24 hour  Intake 200 ml  Output 1910 ml  Net -1710 ml   Filed Weights   10/10/21 0026 10/12/21 1002  Weight: 93 kg 94.3 kg    Exam:  General exam: Middle-age male, moderately built and nourished lying comfortably propped up in bed without distress. Respiratory system: Clear to auscultation. Respiratory effort normal. Cardiovascular system: S1 and S2 heard, RRR.  No JVD, murmurs or pedal edema. Gastrointestinal system: Abdomen is nondistended, soft and nontender. No organomegaly or masses felt. Normal  bowel sounds heard. Central nervous system: Alert and oriented. No focal neurological deficits. Extremities: Left foot dressing clean and dry. Skin: No rashes, lesions or ulcers Psychiatry: Judgement and insight appear normal. Mood & affect appropriate.     Data Reviewed: CBC: Recent Labs  Lab 10/10/21 0028 10/11/21 0340 10/12/21 0219 10/13/21 0349 10/14/21 0613 10/15/21 0141  WBC 24.7* 23.3* 23.3* 22.5* 18.1* 15.6*  NEUTROABS 20.0*  --   --   --   --   --   HGB 9.8* 8.4* 8.5* 8.1* 9.0* 8.0*  HCT 29.7* 26.2* 26.4* 25.1* 28.2* 25.0*  MCV 87.9 87.0 88.0 87.5 88.7 88.7  PLT 309 291 307 304 358 229   Basic Metabolic Panel: Recent Labs  Lab 10/12/21 0219 10/13/21 0349 10/14/21 0613 10/15/21 0141 10/16/21 0224  NA 129* 131* 133* 133* 131*  K 4.8 4.7 4.4 4.7 5.2*  CL 103 106 109 106 107  CO2 17* 18* 20* 17* 18*  GLUCOSE 145* 135* 225* 228* 241*  BUN 42* 40* 40* 40* 42*  CREATININE 4.61* 4.84* 4.52* 4.26* 4.63*  CALCIUM 7.3* 7.4* 7.3* 7.6* 7.7*  PHOS 3.5  --   --   --   --  GFR: Estimated Creatinine Clearance: 18.2 mL/min (A) (by C-G formula based on SCr of 4.63 mg/dL (H)). Liver Function Tests: Recent Labs  Lab 10/10/21 0028 10/12/21 0219 10/13/21 0349 10/16/21 0224  AST 51*  --  69* 140*  ALT 45*  --  55* 138*  ALKPHOS 131*  --  128* 205*  BILITOT 0.3  --  0.5 0.2*  PROT 8.0  --  6.8 7.1  ALBUMIN 2.1* 1.5* <1.5* <1.5*    HbA1C: No results for input(s): HGBA1C in the last 72 hours.  CBG: Recent Labs  Lab 10/15/21 1555 10/15/21 1951 10/16/21 0655 10/16/21 1006 10/16/21 1159  GLUCAP 282* 239* 154* 135* 125*   Urine analysis:    Component Value Date/Time   COLORURINE YELLOW 10/10/2021 2033   APPEARANCEUR CLEAR 10/10/2021 2033   LABSPEC 1.015 10/10/2021 2033   PHURINE 6.0 10/10/2021 2033   GLUCOSEU NEGATIVE 10/10/2021 2033   HGBUR MODERATE (A) 10/10/2021 2033   [place] NEGATIVE 10/10/2021 2033   KETONESUR NEGATIVE 10/10/2021 2033    PROTEINUR 100 (A) 10/10/2021 2033   NITRITE NEGATIVE 10/10/2021 2033   LEUKOCYTESUR NEGATIVE 10/10/2021 2033    Recent Results (from the past 240 hour(s))  WOUND CULTURE     Status: Abnormal   Collection Time: 10/06/21  5:02 PM   Specimen: Wound  Result Value Ref Range Status   MICRO NUMBER: [id number]  Final   SPECIMEN QUALITY: Adequate  Final   SOURCE: WOUND (SITE NOT SPECIFIED)  Final   STATUS: FINAL  Final   GRAM STAIN:   Final    Few Polymorphonuclear leukocytes No epithelial cells seen Few Gram positive cocci in pairs Few Gram negative bacilli   ISOLATE 1: Citrobacter koseri (A)  Final    Comment: Heavy growth of Citrobacter koseri      Susceptibility   Citrobacter koseri - AEROBIC CULT, GRAM STAIN NEGATIVE 1    AMOX/CLAVULANIC <=2 Sensitive     CEFAZOLIN* <=4 Not Reportable      * For infections other than uncomplicated UTI caused by E. coli, K. pneumoniae or P. mirabilis: Cefazolin is resistant if MIC > or = 8 mcg/mL. (Distinguishing susceptible versus intermediate for isolates with MIC < or = 4 mcg/mL requires additional testing.)     CEFTAZIDIME <=1 Sensitive     CEFEPIME <=1 Sensitive     CEFTRIAXONE <=1 Sensitive     CIPROFLOXACIN <=0.25 Sensitive     LEVOFLOXACIN <=0.12 Sensitive     GENTAMICIN <=1 Sensitive     IMIPENEM 1 Sensitive     PIP/TAZO <=4 Sensitive     TOBRAMYCIN <=1 Sensitive     TRIMETH/SULFA* <=20 Sensitive      * For infections other than uncomplicated UTI caused by E. coli, K. pneumoniae or P. mirabilis: Cefazolin is resistant if MIC > or = 8 mcg/mL. (Distinguishing susceptible versus intermediate for isolates with MIC < or = 4 mcg/mL requires additional testing.) Legend: S = Susceptible  I = Intermediate R = Resistant  NS = Not susceptible * = Not tested  NR = Not reported **NN = See antimicrobic comments   Aerobic/Anaerobic Culture w Gram Stain (surgical/deep wound)     Status: None (Preliminary result)   Collection Time: 10/10/21 12:20  AM   Specimen: PATH Other; Tissue  Result Value Ref Range Status   Specimen Description WOUND  Final   Special Requests FIRST METATARSAL BONE  Final   Gram Stain NO WBC SEEN NO ORGANISMS SEEN   Final   Culture  Final    NO GROWTH 3 DAYS NO ANAEROBES ISOLATED; CULTURE IN PROGRESS FOR 5 DAYS Performed at [facility], [place] [address] [place], [place] [address]    Report Status PENDING  Incomplete  Resp Panel by RT-PCR (Flu A&B, Covid) Nasopharyngeal Swab     Status: None   Collection Time: 10/10/21 12:28 AM   Specimen: Nasopharyngeal Swab; Nasopharyngeal(NP) swabs in vial transport medium  Result Value Ref Range Status   SARS Coronavirus 2 by RT PCR NEGATIVE NEGATIVE Final    Comment: (NOTE) SARS-CoV-2 target nucleic acids are NOT DETECTED.  The SARS-CoV-2 RNA is generally detectable in upper respiratory specimens during the acute phase of infection. The lowest concentration of SARS-CoV-2 viral copies this assay can detect is 138 copies/mL. A negative result does not preclude SARS-Cov-2 infection and should not be used as the sole basis for treatment or other patient management decisions. A negative result may occur with  improper specimen collection/handling, submission of specimen other than nasopharyngeal swab, presence of viral mutation(s) within the areas targeted by this assay, and inadequate number of viral copies(<138 copies/mL). A negative result must be combined with clinical observations, patient history, and epidemiological information. The expected result is Negative.  Fact Sheet for Patients:  EntrepreneurPulse.com.au  Fact Sheet for Healthcare Providers:  IncredibleEmployment.be  This test is no t yet approved or cleared by the [place] FDA and  has been authorized for detection and/or diagnosis of SARS-CoV-2 by FDA under an Emergency Use Authorization (EUA). This EUA will remain  in effect (meaning this test can  be used) for the duration of the COVID-19 declaration under Section 564(b)(1) of the Act, 21 U.S.C.section 360bbb-3(b)(1), unless the authorization is terminated  or revoked sooner.       Influenza A by PCR NEGATIVE NEGATIVE Final   Influenza B by PCR NEGATIVE NEGATIVE Final    Comment: (NOTE) The Xpert Xpress SARS-CoV-2/FLU/RSV plus assay is intended as an aid in the diagnosis of influenza from Nasopharyngeal swab specimens and should not be used as a sole basis for treatment. Nasal washings and aspirates are unacceptable for Xpert Xpress SARS-CoV-2/FLU/RSV testing.  Fact Sheet for Patients: EntrepreneurPulse.com.au  Fact Sheet for Healthcare Providers: IncredibleEmployment.be  This test is not yet approved or cleared by the [place] FDA and has been authorized for detection and/or diagnosis of SARS-CoV-2 by FDA under an Emergency Use Authorization (EUA). This EUA will remain in effect (meaning this test can be used) for the duration of the COVID-19 declaration under Section 564(b)(1) of the Act, 21 U.S.C. section 360bbb-3(b)(1), unless the authorization is terminated or revoked.  Performed at [facility], [place] [address], [place], [place] [address]   Blood culture (routine x 2)     Status: None   Collection Time: 10/10/21  1:00 AM   Specimen: BLOOD  Result Value Ref Range Status   Specimen Description BLOOD LEFT ARM  Final   Special Requests   Final    BOTTLES DRAWN AEROBIC AND ANAEROBIC Blood Culture adequate volume   Culture   Final    NO GROWTH 5 DAYS Performed at [facility], [place] [address] [place], [place] [address]    Report Status 10/15/2021 FINAL  Final  Blood culture (routine x 2)     Status: None   Collection Time: 10/10/21  1:05 AM   Specimen: BLOOD  Result Value Ref Range Status   Specimen Description BLOOD RIGHT ARM  Final   Special Requests  Final    BOTTLES DRAWN AEROBIC AND ANAEROBIC Blood  Culture results may not be optimal due to an inadequate volume of blood received in culture bottles   Culture   Final    NO GROWTH 5 DAYS Performed at [facility], [place] [address], [place], [place] [address]    Report Status 10/15/2021 FINAL  Final  Aerobic Culture w Gram Stain (superficial specimen)     Status: None   Collection Time: 10/10/21  6:24 AM   Specimen: Wound  Result Value Ref Range Status   Specimen Description WOUND  Final   Special Requests SINOSP  Final   Gram Stain   Final    ABUNDANT WBC PRESENT, PREDOMINANTLY PMN MODERATE GRAM POSITIVE COCCI    Culture   Final    RARE MULTIPLE ORGANISMS PRESENT, NONE PREDOMINANT NO GROUP A STREP (S.PYOGENES) ISOLATED NO STAPHYLOCOCCUS AUREUS ISOLATED Performed at [facility], [place] [address] [place], [place] [address]    Report Status 10/12/2021 FINAL  Final  Aerobic/Anaerobic Culture w Gram Stain (surgical/deep wound)     Status: None (Preliminary result)   Collection Time: 10/10/21  9:49 PM   Specimen: Abscess; Wound  Result Value Ref Range Status   Specimen Description ABSCESS LEFT FOOT  Final   Special Requests NONE  Final   Gram Stain   Final    FEW WBC PRESENT, PREDOMINANTLY PMN FEW GRAM POSITIVE COCCI IN CHAINS    Culture   Final    FEW STREPTOCOCCUS ANGINOSIS FEW PROTEUS MIRABILIS FEW PREVOTELLA BIVIA BETA LACTAMASE POSITIVE Performed at [facility], [address], [place], [place] [address]    Report Status PENDING  Incomplete   Organism ID, Bacteria STREPTOCOCCUS ANGINOSIS  Final   Organism ID, Bacteria PROTEUS MIRABILIS  Final      Susceptibility   Proteus mirabilis - MIC*    AMPICILLIN <=2 SENSITIVE Sensitive     CEFAZOLIN 8 SENSITIVE Sensitive     CEFEPIME <=0.12 SENSITIVE Sensitive     CEFTAZIDIME <=1 SENSITIVE Sensitive     CEFTRIAXONE <=0.25 SENSITIVE Sensitive     CIPROFLOXACIN <=0.25 SENSITIVE Sensitive     GENTAMICIN <=1 SENSITIVE Sensitive     IMIPENEM 2 SENSITIVE  Sensitive     TRIMETH/SULFA <=20 SENSITIVE Sensitive     AMPICILLIN/SULBACTAM <=2 SENSITIVE Sensitive     PIP/TAZO <=4 SENSITIVE Sensitive     * FEW PROTEUS MIRABILIS   Streptococcus anginosis - MIC*    PENICILLIN <=0.06 SENSITIVE Sensitive     CEFTRIAXONE <=0.12 SENSITIVE Sensitive     ERYTHROMYCIN <=0.12 SENSITIVE Sensitive     LEVOFLOXACIN 1 SENSITIVE Sensitive     VANCOMYCIN 0.5 SENSITIVE Sensitive     * FEW STREPTOCOCCUS ANGINOSIS  MRSA Next Gen by PCR, Nasal     Status: None   Collection Time: 10/11/21  5:54 AM   Specimen: Nasal Mucosa; Nasal Swab  Result Value Ref Range Status   MRSA by PCR Next Gen NOT DETECTED NOT DETECTED Final    Comment: (NOTE) The GeneXpert MRSA Assay (FDA approved for NASAL specimens only), is one component of a comprehensive MRSA colonization surveillance program. It is not intended to diagnose MRSA infection nor to guide or monitor treatment for MRSA infections. Test performance is not FDA approved in patients less than 67 years old. Performed at [facility], [place] [address] [place],  [address]       Studies: No results found.  Scheduled Meds:  (feeding supplement)  PROSource Plus  30 mL Oral BID BM   amLODipine  2.5 mg Oral Daily   feeding supplement (NEPRO CARB STEADY)  237 mL Oral BID BM   heparin injection (subcutaneous)  5,000 Units Subcutaneous Q8H   insulin aspart  0-5 Units Subcutaneous QHS   insulin aspart  0-9 Units Subcutaneous TID WC   insulin detemir  5 Units Subcutaneous Daily   multivitamin with minerals  1 tablet Oral Daily   pregabalin  75 mg Oral BID   senna-docusate  2 tablet Oral Daily    Continuous Infusions:  sodium chloride 10 mL/hr at 10/16/21 1019   DAPTOmycin (CUBICIN)  IV 750 mg (10/15/21 2012)   piperacillin-tazobactam (ZOSYN)  IV 2.25 g (10/16/21 0337)     LOS: 6 days    Vernell Leep, MD,  FACP, [facility], [facility], [facility] (Care Management Physician Certified) [place]  To contact the attending provider between 7A-7P or the covering provider during after hours 7P-7A, please log into the web site [email or web address] and access using universal [place] password for that web site. If you do not have the password, please call the hospital operator.

## 2021-10-16 NOTE — Plan of Care (Signed)

## 2021-10-16 NOTE — Brief Op Note (Signed)
10/16/2021  11:53 AM  PATIENT:  [name]  71 y.o. male  PRE-OPERATIVE DIAGNOSIS:  Heel wound with abscess and probable osteomyelitis, left  POST-OPERATIVE DIAGNOSIS: Same  PROCEDURE:  Procedure(s) with comments: IRRIGATION AND DEBRIDEMENT FOOT (Left) - Abscess left heel Bone Biopsy left heel  SURGEON:  Surgeon(s) and Role:    [name], DPM - Primary  PHYSICIAN ASSISTANT:   ASSISTANTS: none   ANESTHESIA:   MAC  EBL:  30cc    BLOOD ADMINISTERED:none  DRAINS: none   LOCAL MEDICATIONS USED:  MARCAINE    and LIDOCAINE   SPECIMEN:  Source of Specimen:  Left heel bone biopsy   DISPOSITION OF SPECIMEN:  PATHOLOGY  COUNTS:  YES  TOURNIQUET:   Total Tourniquet Time Documented: Ankle (Left) - 40 minutes Total: Ankle (Left) - 40 minutes   DICTATION: .Note written in [place]:  transfer back to medical floor   PATIENT DISPOSITION:  PACU - hemodynamically stable.   Delay start of Pharmacological VTE agent (>24hrs) due to surgical blood loss or risk of bleeding: no

## 2021-10-16 NOTE — Progress Notes (Signed)
Pharmacy Antibiotic Note  [name] is a 71 y.o. male admitted on 10/10/2021 with diabetic foot infection.  Pharmacy has been consulted for Zosyn dosing.  Pt is now s/p I&D (x2) and 1st metatarsal resection by podiatry.    Pt is on D7 of total abx. Wbc has trended down to 15.6. He is now afebrile. Linezolid was recently changed Dapto per ID. Will likely need an extended course of abx. Renal function remains the same with a scr of 4.63.   Plan: -Continue Zosyn 2.25 gm IV Q 8 hours  -Daptomycin 750mg  IV q48 -Weekly CK  Height: 6' (182.9 cm) Weight: 94.3 kg (208 lb) IBW/kg (Calculated) : 77.6  Temp (24hrs), Avg:98.5 F (36.9 C), Min:98.2 F (36.8 C), Max:98.7 F (37.1 C)  Recent Labs  Lab 10/10/21 0028 10/11/21 0340 10/12/21 0219 10/13/21 0349 10/14/21 0613 10/15/21 0141 10/16/21 0224  WBC 24.7* 23.3* 23.3* 22.5* 18.1* 15.6*  --   CREATININE 5.25* 4.73* 4.61* 4.84* 4.52* 4.26* 4.63*  LATICACIDVEN 1.7  --   --   --   --   --   --      Estimated Creatinine Clearance: 18.2 mL/min (A) (by C-G formula based on SCr of 4.63 mg/dL (H)).    No Known Allergies  Antimicrobials this admission: Zosyn 1/9 >>  Linezolid IV 1/9 >>1/12  Dapto 1/12>>  Vanc (pre-op) x 1 dose 1/11   Dose adjustments this admission:   Microbiology results: PTA wound cx 1/5: Citrobacter: sens Augmentin, LVQ, Bactrim, Cefepime, Ceftaz, Cipro, Zosyn, Tobra (MIC 1)  1/9 blood: ngtdF  1/9 left foot abscess: strep anginosis - pan sens, proteus mirabilis - pan sens, prevotella - beta lactamase+  1/9 wound (aerobic): ngtd  1/9 COVID and flu: neg  1/10 MRSA PCR: neg  Onnie Boer, PharmD, BCIDP, AAHIVP, CPP Infectious Disease Pharmacist 10/16/2021 9:41 AM     Thank you for allowing pharmacy to be a part of this patients care.  [employer], Pharm.D., BCPS Clinical Pharmacist Clinical phone for 10/16/2021 from 7:30-3:00 is [phone].  **Pharmacist phone directory can be found on [email or web address] listed  under [place].  10/16/2021 9:37 AM

## 2021-10-16 NOTE — Anesthesia Preprocedure Evaluation (Signed)
Anesthesia Evaluation  Patient identified by MRN, date of birth, ID band Patient awake    Reviewed: Allergy & Precautions, H&P , NPO status , Patient's Chart, lab work & pertinent test results  Airway Mallampati: II  TM Distance: >3 FB Neck ROM: Full    Dental no notable dental hx. (+) Edentulous Upper, Dental Advisory Given   Pulmonary neg pulmonary ROS, former smoker,    Pulmonary exam normal breath sounds clear to auscultation       Cardiovascular hypertension, Pt. on medications  Rhythm:Regular Rate:Normal     Neuro/Psych CVA (2016), No Residual Symptoms negative psych ROS   GI/Hepatic Neg liver ROS, GERD  Controlled,  Endo/Other  diabetes, Poorly Controlled, Type 2, Oral Hypoglycemic Agentsa1c 8.3  Renal/GU CRFRenal diseaseCKD 4, Cr 4.61  negative genitourinary   Musculoskeletal Osteo LLE   Abdominal   Peds  Hematology  (+) Blood dyscrasia, anemia , Hb 8.5   Anesthesia Other Findings Chronic pain- hydrocodone 5/325  Reproductive/Obstetrics negative OB ROS                             Anesthesia Physical  Anesthesia Plan  ASA: 3  Anesthesia Plan: MAC   Post-op Pain Management: Minimal or no pain anticipated   Induction: Intravenous  PONV Risk Score and Plan: 2 and Propofol infusion, TIVA, Treatment may vary due to age or medical condition and Ondansetron  Airway Management Planned: Natural Airway and Simple Face Mask  Additional Equipment: None  Intra-op Plan:   Post-operative Plan:   Informed Consent: I have reviewed the patients History and Physical, chart, labs and discussed the procedure including the risks, benefits and alternatives for the proposed anesthesia with the patient or authorized representative who has indicated his/her understanding and acceptance.     Dental advisory given  Plan Discussed with: CRNA  Anesthesia Plan Comments:          Anesthesia Quick Evaluation

## 2021-10-16 NOTE — H&P (Signed)
Brief Podiatry Surgery H&P  Patient met bedside. Confirms NPO for Left foot Irrigation and debridement of left heel with removal of all infected soft tissue and bone as indicated. All questions answered. No guarantees given or implied. Consent signed.  Dr. [name] On Call provider  [facility] [phone] office [phone] cell Available via secure chat

## 2021-10-16 NOTE — Plan of Care (Signed)
  Problem: Education: Goal: Knowledge of General Education information will improve Description Including pain rating scale, medication(s)/side effects and non-pharmacologic comfort measures Outcome: Progressing   Problem: Health Behavior/Discharge Planning: Goal: Ability to manage health-related needs will improve Outcome: Progressing   Problem: Clinical Measurements: Goal: Ability to maintain clinical measurements within normal limits will improve Outcome: Progressing Goal: Will remain free from infection Outcome: Progressing   Problem: Activity: Goal: Risk for activity intolerance will decrease Outcome: Progressing   Problem: Nutrition: Goal: Adequate nutrition will be maintained Outcome: Progressing   Problem: Coping: Goal: Level of anxiety will decrease Outcome: Progressing   Problem: Elimination: Goal: Will not experience complications related to bowel motility Outcome: Progressing   Problem: Pain Managment: Goal: General experience of comfort will improve Outcome: Progressing   Problem: Safety: Goal: Ability to remain free from injury will improve Outcome: Progressing   Problem: Skin Integrity: Goal: Risk for impaired skin integrity will decrease Outcome: Progressing   

## 2021-10-16 NOTE — Anesthesia Postprocedure Evaluation (Signed)
Anesthesia Post Note  Patient: [name]  Procedure(s) Performed: IRRIGATION AND DEBRIDEMENT FOOT (Left: Heel)     Patient location during evaluation: PACU Anesthesia Type: MAC Level of consciousness: awake and alert Pain management: pain level controlled Vital Signs Assessment: post-procedure vital signs reviewed and stable Respiratory status: spontaneous breathing Cardiovascular status: stable Anesthetic complications: no   No notable events documented.  Last Vitals:  Vitals:   10/16/21 1156 10/16/21 1210  BP: 137/82 (!) 158/81  Pulse: 74 65  Resp: 17 13  Temp: 36.5 C   SpO2: 99% 100%    Last Pain:  Vitals:   10/16/21 1210  TempSrc:   PainSc: 0-No pain                 Nolon Nations

## 2021-10-17 ENCOUNTER — Encounter (HOSPITAL_COMMUNITY): Payer: Self-pay | Admitting: Sports Medicine

## 2021-10-17 DIAGNOSIS — N179 Acute kidney failure, unspecified: Secondary | ICD-10-CM | POA: Diagnosis not present

## 2021-10-17 DIAGNOSIS — R7989 Other specified abnormal findings of blood chemistry: Secondary | ICD-10-CM | POA: Diagnosis not present

## 2021-10-17 DIAGNOSIS — E875 Hyperkalemia: Secondary | ICD-10-CM | POA: Diagnosis not present

## 2021-10-17 DIAGNOSIS — R6520 Severe sepsis without septic shock: Secondary | ICD-10-CM | POA: Diagnosis not present

## 2021-10-17 DIAGNOSIS — A419 Sepsis, unspecified organism: Secondary | ICD-10-CM | POA: Diagnosis not present

## 2021-10-17 DIAGNOSIS — N189 Chronic kidney disease, unspecified: Secondary | ICD-10-CM | POA: Diagnosis not present

## 2021-10-17 LAB — COMPREHENSIVE METABOLIC PANEL
ALT: 186 U/L — ABNORMAL HIGH (ref 0–44)
AST: 181 U/L — ABNORMAL HIGH (ref 15–41)
Albumin: 1.6 g/dL — ABNORMAL LOW (ref 3.5–5.0)
Alkaline Phosphatase: 212 U/L — ABNORMAL HIGH (ref 38–126)
Anion gap: 7 (ref 5–15)
BUN: 43 mg/dL — ABNORMAL HIGH (ref 8–23)
CO2: 19 mmol/L — ABNORMAL LOW (ref 22–32)
Calcium: 7.7 mg/dL — ABNORMAL LOW (ref 8.9–10.3)
Chloride: 107 mmol/L (ref 98–111)
Creatinine, Ser: 4.39 mg/dL — ABNORMAL HIGH (ref 0.61–1.24)
GFR, Estimated: 14 mL/min — ABNORMAL LOW (ref >60)
Glucose, Bld: 202 mg/dL — ABNORMAL HIGH (ref 70–99)
Potassium: 5.2 mmol/L — ABNORMAL HIGH (ref 3.5–5.1)
Sodium: 133 mmol/L — ABNORMAL LOW (ref 135–145)
Total Bilirubin: 0.5 mg/dL (ref 0.3–1.2)
Total Protein: 7.7 g/dL (ref 6.5–8.1)

## 2021-10-17 LAB — GLUCOSE, CAPILLARY
Glucose-Capillary: 193 mg/dL — ABNORMAL HIGH (ref 70–99)
Glucose-Capillary: 267 mg/dL — ABNORMAL HIGH (ref 70–99)
Glucose-Capillary: 295 mg/dL — ABNORMAL HIGH (ref 70–99)
Glucose-Capillary: 265 mg/dL — ABNORMAL HIGH (ref 70–99)

## 2021-10-17 LAB — CBC
HCT: 26.9 % — ABNORMAL LOW (ref 39.0–52.0)
Hemoglobin: 8.3 g/dL — ABNORMAL LOW (ref 13.0–17.0)
MCH: 27.5 pg (ref 26.0–34.0)
MCHC: 30.9 g/dL (ref 30.0–36.0)
MCV: 89.1 fL (ref 80.0–100.0)
Platelets: 372 10*3/uL (ref 150–400)
RBC: 3.02 MIL/uL — ABNORMAL LOW (ref 4.22–5.81)
RDW: 14.5 % (ref 11.5–15.5)
WBC: 13.8 10*3/uL — ABNORMAL HIGH (ref 4.0–10.5)
nRBC: 0 % (ref 0.0–0.2)

## 2021-10-17 LAB — AEROBIC/ANAEROBIC CULTURE W GRAM STAIN (SURGICAL/DEEP WOUND)
Culture: NO GROWTH
Gram Stain: NONE SEEN

## 2021-10-17 MED ORDER — AMOXICILLIN-POT CLAVULANATE 875-125 MG PO TABS
1.0000 | ORAL_TABLET | Freq: Two times a day (BID) | ORAL | Status: DC
  Administered 2021-10-17 – 2021-10-18 (×3): 1 via ORAL
  Filled 2021-10-17 (×3): qty 1

## 2021-10-17 MED ORDER — INSULIN DETEMIR 100 UNIT/ML ~~LOC~~ SOLN
10.0000 [IU] | Freq: Every day | SUBCUTANEOUS | Status: DC
  Administered 2021-10-17 – 2021-10-18 (×2): 10 [IU] via SUBCUTANEOUS
  Filled 2021-10-17 (×2): qty 0.1

## 2021-10-17 MED ORDER — DOXYCYCLINE HYCLATE 100 MG PO TABS
100.0000 mg | ORAL_TABLET | Freq: Two times a day (BID) | ORAL | Status: DC
Start: 2021-10-17 — End: 2021-10-20
  Administered 2021-10-17 – 2021-10-20 (×7): 100 mg via ORAL
  Filled 2021-10-17 (×7): qty 1

## 2021-10-17 MED ORDER — SODIUM ZIRCONIUM CYCLOSILICATE 10 G PO PACK
10.0000 g | PACK | Freq: Two times a day (BID) | ORAL | Status: AC
  Administered 2021-10-17 (×2): 10 g via ORAL
  Filled 2021-10-17 (×2): qty 1

## 2021-10-17 NOTE — Progress Notes (Signed)
PROGRESS NOTE  [name] TMH:[id number] DOB: 06/02/1951 DOA: 10/10/2021 PCP: [facility]  HPI/Recap of past 24 hours: [name] is a 71 y.o. male with medical history significant of DM2, CKD4, HTN.  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  Who presented to [facility] ED from home with ongoing ulceration of ball area of L foot and worsening renal function.  Followed by podiatry, with concern for osteomyelitis.  Admitted for left foot gas gangrene and AKI on CKD 4.  Post I&D on 10/10/2021 by podiatry Dr March Rummage.  Patient also seen by nephrology, Dr. Hollie Salk.  S/p left foot wound I&D and left first metatarsal resection 1/11.  Continues to spike high fevers despite broad-spectrum IV antibiotics, infectious disease consulted for assistance on 1/12.  S/p left heel wound debridement with irrigation and left heel bone biopsy 1/15.  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 ID discontinued linezolid and started daptomycin and 1/12. Blood cultures x2: Negative to date. Wound culture 1/9: No group A strep or staph aureus isolated. Left foot abscess culture 1/9: Few Streptococcus anginosus and few Proteus mirabilis, pansensitive Had ongoing fever spikes up to 102.7 and thereby ID was consulted.  MRI Infectious disease consultation appreciated.  MRI of left foot revealed postsurgical changes.  MRI of left ankle shows possible phlegmon/developing abscess 2.3 x 1.1 x 2.2 cm along the superior margin  of medial hindfoot ulcer.  Planter fasciitis versus early acute osteomyelitis.  Myositis.  Elongated IM abscess within the proximal abductor halluces muscle measuring 3.7 x 0.8 x 1.7 cm. S/p left heel wound I&D and left heel bone biopsy 1/15 ID recommended orthopedic consultation for calcaneal osteomyelitis.  However podiatry discussed with patient and he does not want to lose the leg or have a amputation at this time but he is aware that there are risks (I confirmed this with patient and his spouse at bedside).  Current plan is for long-term antibiotics and wound care. Bone biopsy culture 1/15: Needs to be followed up.   AKI on CKD 4, the setting of sepsis and recent Bactrim administration, improving Baseline creatinine appears to be 4 Presented with creatinine greater than 5 Nephrology assisted and signed off.  Suspected AKI due to sepsis and recent Bactrim use. UA relatively bland.  Renal ultrasound without obstruction. Creatinine had peaked at 5.25 on 1/9.  Since 1/10, creatinine improved slightly but has plateaued in the high 4 range. Avoid nephrotoxic agents, hypotension and IV contrast unless absolutely necessary. Continue to hold off torsemide/ARB/SGLT2 inhibitors Trend daily BMP. Clinically euvolemic.  Off IV fluids. As per nephrology, has outpatient appointment with Dr. Juleen China on 10/20/2021.  Nephrology recommends holding olmesartan until he sees his nephrologist and can resume home torsemide at discharge.  Nephrology signed off 1/13.  Non anion gap metabolic acidosis in the setting of AKI Continue trending daily BMP.  Bicarb mildly low but stable overall.  Normal anion gap.  Nephrology assisted with care and  signed off on 1/13.  Started sodium bicarbonate tablets.  Hyperkalemia: Mild.  Lokelma 10 g x 1 dose without change.  Repeat Lokelma 10 g twice daily x2 doses today.  Initiated sodium bicarb.  Changed diet to renal/carb modified.  Follow BMP in AM.  Hyponatremia Likely due to  AKI.  Trend daily BMP. Stable.  Elevated liver chemistries, unclear etiology Higher than on 1/12.?  Related to daptomycin.  Continue to gradually worsen.  Communicated with ID, who will reassess for today and if it looks clean, plan to transition to Augmentin and doxycycline.  No GI symptoms.  Continue to monitor LFTs daily.  Hypertension No ARB or diuretics at this time. Mildly uncontrolled.  Increased amlodipine to 5 Mg daily (had not gotten his a.m. meds yet).  Better controlled.  Type 2 diabetes with hyperglycemia Hemoglobin A1c 8.3 on 10/10/2021. Uncontrolled.  Increased Levemir to 10 units daily, continue SSI.  Monitor and adjust insulins as needed.  Anemia of chronic disease Hemoglobin stable in the 8 g range for several days now.    Code Status: Full code Family Communication: Spouse at bedside on a daily basis including today. Disposition Plan: Pending clinical improvement and clearance by specialist.  Consultants: Nephrology Podiatry  Procedures: Post I&D on 10/10/2021 and 1/11. I&D of left heel wound and left heel bone biopsy 1/15  Antimicrobials: IV Zosyn 1/8 > IV linezolid-discontinued IV daptomycin 1/12 >  DVT prophylaxis: Subcu heparin 3 times daily  Status is: Inpatient  Patient requires at least 2 midnights for further evaluation and treatment of present condition.  Subjective: Mild left foot pain.  Denies any other complaints.  Objective: Vitals:   10/16/21 1408 10/16/21 2000 10/17/21 0612 10/17/21 0811  BP: (!) 155/77 (!) 182/77 (!) 151/80 136/65  Pulse: 92 89 92 100  Resp: 18 20 17 18   Temp: 97.8 F (36.6 C) 97.9 F (36.6 C) 98.7 F (37.1 C) 98.6 F (37 C)  TempSrc: Oral Oral Oral Oral  SpO2: 100% 100% 97% 100%  Weight:      Height:        Intake/Output Summary (Last 24 hours) at 10/17/2021 1114 Last data filed at 10/17/2021 3267 Gross per 24 hour  Intake 335 ml  Output 1640 ml  Net -1305 ml   Filed Weights   10/10/21 0026 10/12/21  1002  Weight: 93 kg 94.3 kg    Exam:  General exam: Middle-age male, moderately built and nourished lying comfortably propped up in bed without distress. Respiratory system: Clear to auscultation. Respiratory effort normal. Cardiovascular system: S1 and S2 heard, RRR.  No JVD, murmurs or pedal edema. Gastrointestinal system: Abdomen is nondistended, soft and nontender. No organomegaly or masses felt. Normal bowel sounds heard. Central nervous system: Alert and oriented. No focal neurological deficits. Extremities: Left foot postop dressing clean and dry. Skin: No rashes, lesions or ulcers Psychiatry: Judgement and insight appear normal. Mood & affect appropriate.     Data Reviewed: CBC: Recent Labs  Lab 10/12/21 0219 10/13/21 0349 10/14/21 0613 10/15/21 0141 10/17/21 0110  WBC 23.3* 22.5* 18.1* 15.6* 13.8*  HGB 8.5* 8.1* 9.0* 8.0* 8.3*  HCT 26.4* 25.1* 28.2* 25.0* 26.9*  MCV 88.0 87.5 88.7 88.7 89.1  PLT 307 304 358 335 124   Basic Metabolic Panel: Recent Labs  Lab 10/12/21 0219 10/13/21 0349 10/14/21 0613 10/15/21 0141 10/16/21 0224 10/17/21 0110  NA 129* 131* 133* 133* 131* 133*  K 4.8 4.7 4.4 4.7 5.2* 5.2*  CL 103 106 109 106 107  107  CO2 17* 18* 20* 17* 18* 19*  GLUCOSE 145* 135* 225* 228* 241* 202*  BUN 42* 40* 40* 40* 42* 43*  CREATININE 4.61* 4.84* 4.52* 4.26* 4.63* 4.39*  CALCIUM 7.3* 7.4* 7.3* 7.6* 7.7* 7.7*  PHOS 3.5  --   --   --   --   --    GFR: Estimated Creatinine Clearance: 19.2 mL/min (A) (by C-G formula based on SCr of 4.39 mg/dL (H)). Liver Function Tests: Recent Labs  Lab 10/12/21 0219 10/13/21 0349 10/16/21 0224 10/17/21 0110  AST  --  69* 140* 181*  ALT  --  55* 138* 186*  ALKPHOS  --  128* 205* 212*  BILITOT  --  0.5 0.2* 0.5  PROT  --  6.8 7.1 7.7  ALBUMIN 1.5* <1.5* <1.5* 1.6*    HbA1C: No results for input(s): HGBA1C in the last 72 hours.  CBG: Recent Labs  Lab 10/16/21 1006 10/16/21 1159 10/16/21 1543  10/16/21 2015 10/17/21 0809  GLUCAP 135* 125* 303* 264* 193*   Urine analysis:    Component Value Date/Time   COLORURINE YELLOW 10/10/2021 2033   APPEARANCEUR CLEAR 10/10/2021 2033   LABSPEC 1.015 10/10/2021 2033   PHURINE 6.0 10/10/2021 2033   GLUCOSEU NEGATIVE 10/10/2021 2033   HGBUR MODERATE (A) 10/10/2021 2033   BILIRUBINUR NEGATIVE 10/10/2021 2033   KETONESUR NEGATIVE 10/10/2021 2033   PROTEINUR 100 (A) 10/10/2021 2033   NITRITE NEGATIVE 10/10/2021 2033   LEUKOCYTESUR NEGATIVE 10/10/2021 2033    Recent Results (from the past 240 hour(s))  Aerobic/Anaerobic Culture w Gram Stain (surgical/deep wound)     Status: None (Preliminary result)   Collection Time: 10/10/21 12:20 AM   Specimen: PATH Other; Tissue  Result Value Ref Range Status   Specimen Description WOUND  Final   Special Requests FIRST METATARSAL BONE  Final   Gram Stain NO WBC SEEN NO ORGANISMS SEEN   Final   Culture   Final    NO GROWTH 4 DAYS NO ANAEROBES ISOLATED; CULTURE IN PROGRESS FOR 5 DAYS Performed at [facility], [place] [address] [place], [place] [address]    Report Status PENDING  Incomplete  Resp Panel by RT-PCR (Flu A&B, Covid) Nasopharyngeal Swab     Status: None   Collection Time: 10/10/21 12:28 AM   Specimen: Nasopharyngeal Swab; Nasopharyngeal(NP) swabs in vial transport medium  Result Value Ref Range Status   SARS Coronavirus 2 by RT PCR NEGATIVE NEGATIVE Final    Comment: (NOTE) SARS-CoV-2 target nucleic acids are NOT DETECTED.  The SARS-CoV-2 RNA is generally detectable in upper respiratory specimens during the acute phase of infection. The lowest concentration of SARS-CoV-2 viral copies this assay can detect is 138 copies/mL. A negative result does not preclude SARS-Cov-2 infection and should not be used as the sole basis for treatment or other patient management decisions. A negative result may occur with  improper specimen collection/handling, submission of specimen  other than nasopharyngeal swab, presence of viral mutation(s) within the areas targeted by this assay, and inadequate number of viral copies(<138 copies/mL). A negative result must be combined with clinical observations, patient history, and epidemiological information. The expected result is Negative.  Fact Sheet for Patients:  EntrepreneurPulse.com.au  Fact Sheet for Healthcare Providers:  IncredibleEmployment.be  This test is no t yet approved or cleared by the [place] FDA and  has been authorized for detection and/or diagnosis of SARS-CoV-2 by FDA under an Emergency Use Authorization (EUA). This EUA will remain  in  effect (meaning this test can be used) for the duration of the COVID-19 declaration under Section 564(b)(1) of the Act, 21 U.S.C.section 360bbb-3(b)(1), unless the authorization is terminated  or revoked sooner.       Influenza A by PCR NEGATIVE NEGATIVE Final   Influenza B by PCR NEGATIVE NEGATIVE Final    Comment: (NOTE) The Xpert Xpress SARS-CoV-2/FLU/RSV plus assay is intended as an aid in the diagnosis of influenza from Nasopharyngeal swab specimens and should not be used as a sole basis for treatment. Nasal washings and aspirates are unacceptable for Xpert Xpress SARS-CoV-2/FLU/RSV testing.  Fact Sheet for Patients: EntrepreneurPulse.com.au  Fact Sheet for Healthcare Providers: IncredibleEmployment.be  This test is not yet approved or cleared by the [place] FDA and has been authorized for detection and/or diagnosis of SARS-CoV-2 by FDA under an Emergency Use Authorization (EUA). This EUA will remain in effect (meaning this test can be used) for the duration of the COVID-19 declaration under Section 564(b)(1) of the Act, 21 U.S.C. section 360bbb-3(b)(1), unless the authorization is terminated or revoked.  Performed at [facility], [place] [address] [place],  [place] [address]   Blood culture (routine x 2)     Status: None   Collection Time: 10/10/21  1:00 AM   Specimen: BLOOD  Result Value Ref Range Status   Specimen Description BLOOD LEFT ARM  Final   Special Requests   Final    BOTTLES DRAWN AEROBIC AND ANAEROBIC Blood Culture adequate volume   Culture   Final    NO GROWTH 5 DAYS Performed at [facility], [place] [address] [place], [place] [address]    Report Status 10/15/2021 FINAL  Final  Blood culture (routine x 2)     Status: None   Collection Time: 10/10/21  1:05 AM   Specimen: BLOOD  Result Value Ref Range Status   Specimen Description BLOOD RIGHT ARM  Final   Special Requests   Final    BOTTLES DRAWN AEROBIC AND ANAEROBIC Blood Culture results may not be optimal due to an inadequate volume of blood received in culture bottles   Culture   Final    NO GROWTH 5 DAYS Performed at [facility], [place] [address] [place], [place] [address]    Report Status 10/15/2021 FINAL  Final  Aerobic Culture w Gram Stain (superficial specimen)     Status: None   Collection Time: 10/10/21  6:24 AM   Specimen: Wound  Result Value Ref Range Status   Specimen Description WOUND  Final   Special Requests SINOSP  Final   Gram Stain   Final    ABUNDANT WBC PRESENT, PREDOMINANTLY PMN MODERATE GRAM POSITIVE COCCI    Culture   Final    RARE MULTIPLE ORGANISMS PRESENT, NONE PREDOMINANT NO GROUP A STREP (S.PYOGENES) ISOLATED NO STAPHYLOCOCCUS AUREUS ISOLATED Performed at [facility], [place] [address], [place], [place] [address]    Report Status 10/12/2021 FINAL  Final  Aerobic/Anaerobic Culture w Gram Stain (surgical/deep wound)     Status: None   Collection Time: 10/10/21  9:49 PM   Specimen: Abscess; Wound  Result Value Ref Range Status   Specimen Description ABSCESS LEFT FOOT  Final   Special Requests NONE  Final   Gram Stain   Final    FEW WBC PRESENT, PREDOMINANTLY PMN FEW GRAM POSITIVE COCCI IN CHAINS    Culture   Final    FEW  STREPTOCOCCUS ANGINOSIS FEW PROTEUS MIRABILIS FEW PREVOTELLA  BIVIA BETA LACTAMASE POSITIVE Performed at [facility], [place] [address], [place], [place] [address]    Report Status 10/16/2021 FINAL  Final   Organism ID, Bacteria STREPTOCOCCUS ANGINOSIS  Final   Organism ID, Bacteria PROTEUS MIRABILIS  Final      Susceptibility   Proteus mirabilis - MIC*    AMPICILLIN <=2 SENSITIVE Sensitive     CEFAZOLIN 8 SENSITIVE Sensitive     CEFEPIME <=0.12 SENSITIVE Sensitive     CEFTAZIDIME <=1 SENSITIVE Sensitive     CEFTRIAXONE <=0.25 SENSITIVE Sensitive     CIPROFLOXACIN <=0.25 SENSITIVE Sensitive     GENTAMICIN <=1 SENSITIVE Sensitive     IMIPENEM 2 SENSITIVE Sensitive     TRIMETH/SULFA <=20 SENSITIVE Sensitive     AMPICILLIN/SULBACTAM <=2 SENSITIVE Sensitive     PIP/TAZO <=4 SENSITIVE Sensitive     * FEW PROTEUS MIRABILIS   Streptococcus anginosis - MIC*    PENICILLIN <=0.06 SENSITIVE Sensitive     CEFTRIAXONE <=0.12 SENSITIVE Sensitive     ERYTHROMYCIN <=0.12 SENSITIVE Sensitive     LEVOFLOXACIN 1 SENSITIVE Sensitive     VANCOMYCIN 0.5 SENSITIVE Sensitive     * FEW STREPTOCOCCUS ANGINOSIS  MRSA Next Gen by PCR, Nasal     Status: None   Collection Time: 10/11/21  5:54 AM   Specimen: Nasal Mucosa; Nasal Swab  Result Value Ref Range Status   MRSA by PCR Next Gen NOT DETECTED NOT DETECTED Final    Comment: (NOTE) The GeneXpert MRSA Assay (FDA approved for NASAL specimens only), is one component of a comprehensive MRSA colonization surveillance program. It is not intended to diagnose MRSA infection nor to guide or monitor treatment for MRSA infections. Test performance is not FDA approved in patients less than 36 years old. Performed at [facility], [place] [address], [place], [place] [address]       Studies: No results found.  Scheduled Meds:  (feeding supplement) PROSource Plus  30 mL Oral BID BM   amLODipine  5 mg Oral Daily   feeding supplement (NEPRO CARB  STEADY)  237 mL Oral BID BM   heparin injection (subcutaneous)  5,000 Units Subcutaneous Q8H   insulin aspart  0-5 Units Subcutaneous QHS   insulin aspart  0-9 Units Subcutaneous TID WC   insulin detemir  10 Units Subcutaneous Daily   multivitamin with minerals  1 tablet Oral Daily   pregabalin  75 mg Oral BID   senna-docusate  2 tablet Oral Daily   sodium bicarbonate  1,300 mg Oral BID   sodium zirconium cyclosilicate  10 g Oral BID    Continuous Infusions:  sodium chloride 10 mL/hr at 10/16/21 1019   DAPTOmycin (CUBICIN)  IV Stopped (10/15/21 2153)   piperacillin-tazobactam (ZOSYN)  IV 2.25 g (10/17/21 0413)     LOS: 7 days    Vernell Leep, MD,  FACP, [facility], [facility], [facility] (Care Management Physician Certified) [place]  To contact the attending provider between 7A-7P or the covering provider during after hours 7P-7A, please log into the web site [email or web address] and access using universal [place] password for that web site. If you do not have the password, please call the hospital operator.

## 2021-10-17 NOTE — TOC Progression Note (Addendum)
Transition of Care [facility]) - Progression Note    Patient Details  Name: [name] MRN: [id number] Date of Birth: 1951-06-20  [facility] CM/SW Contact  Bartholomew Crews, RN Phone Number: [phone] 10/17/2021, 10:10 AM  Clinical Narrative:     During rounds today, advised that patient to get wound vac and may need long term IV antibiotics pending ID determination.   Referral to Pam at [facility] who will follow for potential IV antibiotics at home.   Referral to [facility] for wound vac. Due to insurance, authorization may take a couple days to process.   Noted that previous CM had placed referral to [facility] for wound care. Updated liaison at [facility], with updated needs.   TOC following for transition needs.   Update 3:30 PM - escript could not be processed wound vac order signed by Dr. Cannon Kettle and faxed to [facility] - receipt acknowledged. Order to be processed by [facility] - anticipate authorization to take a couple days.   Expected Discharge Plan: [place] Barriers to Discharge: Continued Medical Work up  Expected Discharge Plan and Services Expected Discharge Plan: [place]   Discharge Planning Services: CM Consult   Living arrangements for the past 2 months: Single Family Home                           HH Arranged: RN [facility] Agency: [place] Date [place]: 10/14/21 Time [place]: [phone] Representative spoke with at [place]: [place] ([place]) Interventions    Readmission Risk Interventions No flowsheet data found.

## 2021-10-17 NOTE — Progress Notes (Signed)
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   #Left foot osteomyelitis SP I&D on 1/9 and 1st metatarsal resection on 1/11 #Calcaneal osteomyelitis and heel abscess SP I&D on 1/15 #Persistent fevers-resolved -OR Cx+ form 1/9+ strep anginosis, proteus mirabilis and Prevotella bivia -Pt was on bactrim for about 3 days prior to admission and received antibiotics for a bout a day inpatient prior to OR on 1/.9 -He is currently on daptomycin and pip-tazo  -MRI left ankle showed abscess in hindfoot, calcaneal acute osteomyelitis, abscess in the proximal abductor hallucis -ON 1/15 SP debridement of left heel with bone Bx.  Recommendations -D/C pip-tazo and daptomycin -Start Augmentin and doxycyline to complete 6 weeks of antibiotics from OR 1/15 (EOT 11/26/21) -Follow-up on bone Bx pathology -Follow-up in ID clinic  Microbiology:   Antibiotics: Piptazo 1/9-p Daptomycin 1/13-p Vancomycin 1/11 Linezolid 1/8-11  Cultures Blood 1/8 NGTD OR Cx  1/9 Strep anginosis, Proteus mirabilis, prevotella bivia  SUBJECTIVE: Resting in bed. Discussed change in antibiotics with pt.   Review of Systems: Review of Systems  All other systems reviewed and are negative.   Scheduled Meds:  (feeding supplement) PROSource Plus  30 mL Oral BID BM   amLODipine  5 mg Oral Daily   feeding supplement (NEPRO CARB STEADY)  237 mL Oral BID BM   heparin injection (subcutaneous)   5,000 Units Subcutaneous Q8H   insulin aspart  0-5 Units Subcutaneous QHS   insulin aspart  0-9 Units Subcutaneous TID WC   insulin detemir  10 Units Subcutaneous Daily   multivitamin with minerals  1 tablet Oral Daily   pregabalin  75 mg Oral BID   senna-docusate  2 tablet Oral Daily   sodium bicarbonate  1,300 mg Oral BID   sodium zirconium cyclosilicate  10 g Oral BID   Continuous Infusions:  sodium chloride 10 mL/hr at 10/16/21 1019   DAPTOmycin (CUBICIN)  IV Stopped (10/15/21 2153)   piperacillin-tazobactam (ZOSYN)  IV 2.25 g (10/17/21 0413)   PRN Meds:.acetaminophen **OR** acetaminophen, HYDROcodone-acetaminophen, HYDROmorphone (DILAUDID) injection, ondansetron **OR** ondansetron (ZOFRAN) IV No Known Allergies  OBJECTIVE: Vitals:   10/16/21 1408 10/16/21 2000 10/17/21 0612 10/17/21 0811  BP: (!) 155/77 (!) 182/77 (!) 151/80 136/65  Pulse: 92 89 92 100  Resp: 18 20 17 18   Temp: 97.8 F (36.6 C) 97.9 F (36.6 C) 98.7 F (37.1 C) 98.6 F (37 C)  TempSrc: Oral Oral Oral Oral  SpO2: 100% 100% 97% 100%  Weight:      Height:       Body mass index is 28.21 kg/m.  Physical Exam Constitutional:      General: He is not in acute distress.    Appearance: He is normal weight. He is not toxic-appearing.  HENT:     Head: Normocephalic and atraumatic.     Right Ear: External ear  normal.     Left Ear: External ear normal.     Nose: No congestion or rhinorrhea.     Mouth/Throat:     Mouth: Mucous membranes are moist.     Pharynx: Oropharynx is clear.  Eyes:     Extraocular Movements: Extraocular movements intact.     Conjunctiva/sclera: Conjunctivae normal.     Pupils: Pupils are equal, round, and reactive to light.  Cardiovascular:     Rate and Rhythm: Normal rate and regular rhythm.     Heart sounds: No murmur heard.   No friction rub. No gallop.  Pulmonary:     Effort: Pulmonary effort is normal.     Breath sounds: Normal breath sounds.  Abdominal:     General:  Abdomen is flat. Bowel sounds are normal.     Palpations: Abdomen is soft.  Musculoskeletal:        General: No swelling.     Cervical back: Normal range of motion and neck supple.     Comments: Left foot heel wound with clean margins. Amputation site without signs of erythema.   Skin:    General: Skin is warm and dry.  Neurological:     General: No focal deficit present.     Mental Status: He is oriented to person, place, and time.  Psychiatric:        Mood and Affect: Mood normal.      Lab Results Lab Results  Component Value Date   WBC 13.8 (H) 10/17/2021   HGB 8.3 (L) 10/17/2021   HCT 26.9 (L) 10/17/2021   MCV 89.1 10/17/2021   PLT 372 10/17/2021    Lab Results  Component Value Date   CREATININE 4.39 (H) 10/17/2021   BUN 43 (H) 10/17/2021   NA 133 (L) 10/17/2021   K 5.2 (H) 10/17/2021   CL 107 10/17/2021   CO2 19 (L) 10/17/2021    Lab Results  Component Value Date   ALT 186 (H) 10/17/2021   AST 181 (H) 10/17/2021   ALKPHOS 212 (H) 10/17/2021   BILITOT 0.5 10/17/2021        Laurice Record, [place] Infectious [facility] 10/17/2021, 9:38 AM

## 2021-10-17 NOTE — Progress Notes (Signed)
Physical Therapy Treatment Patient Details Name: [name] MRN: [id number] DOB: Jan 04, 1951 Today's Date: 10/17/2021   History of Present Illness 71 yo male  was admitted 1/9 for sepsis and received I and D on L foot.  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  ABT contines, has dx sepsis, AKI, fevers, non anion gap metabolic acidosis, anemia.  PMHx:  DM, CKD4, HTN, diabetic foot ulcer, amp 1st and 2nd toes L foot,    PT Comments    Received pt supine in bed. Session with emphasis on functional mobility, endurance, and gait training. Pt performed bed mobility with supervision and transfers with RW and min A. Pt progressed with ambulation distance today but demonstrates difficulty maintaining LLE WB through heel only in CAM boot. Pt requested to return to bed to eat lunch and left with all needs within reach. Acute PT to cont to follow.     Recommendations for follow up therapy are one component of a multi-disciplinary discharge planning process, led by the attending physician.  Recommendations may be updated based on patient status, additional functional criteria and insurance authorization.  Follow Up Recommendations  Skilled nursing-short term rehab (<3 hours/day)     Assistance Recommended at Discharge Frequent or constant Supervision/Assistance  Patient can return home with the following A little help with walking and/or transfers;A little help with bathing/dressing/bathroom;Assistance with cooking/housework;Help with stairs or ramp for entrance;Assist for transportation   Equipment Recommendations  Rolling walker (2 wheels)    Recommendations for Other Services       Precautions / Restrictions Precautions Precautions: Fall Required Braces or Orthoses: Other Brace Other Brace: L CAM boot Restrictions Weight Bearing Restrictions: Yes LLE Weight Bearing: Partial weight bearing LLE Partial Weight Bearing Percentage or Pounds: through heel only      Mobility  Bed Mobility Overal bed mobility: Needs Assistance Bed Mobility: Rolling;Supine to Sit;Sit to Supine Rolling: Supervision   Supine to sit: Supervision Sit to supine: Supervision   General bed mobility comments: HOB elevated and use of bedrails. Patient Response: Cooperative  Transfers Overall transfer level: Needs assistance Equipment used: Standard walker Transfers: Sit to/from Stand Sit to Stand: Min assist           General transfer comment: min A from low sitting EOB using RW - cues for hand placement    Ambulation/Gait Ambulation/Gait assistance: Min assist Gait Distance (Feet): 80 Feet Assistive device: Rolling walker (2 wheels) Gait Pattern/deviations: Step-through pattern;Decreased stride length;Decreased weight shift to left;Decreased step length - right;Decreased step length - left;Antalgic;Trunk flexed;Narrow base of support Gait velocity: decreased Gait velocity interpretation: <1.8 ft/sec, indicate of risk for recurrent falls   General Gait Details: difficulty ambulating with WB through heel only   Stairs             Wheelchair Mobility    Modified Rankin (Stroke Patients Only)       Balance Overall balance assessment: Needs assistance Sitting-balance support: Feet supported;Bilateral upper extremity supported Sitting balance-Leahy Scale: Good     Standing balance support: Bilateral upper extremity supported (RW) Standing balance-Leahy Scale: Poor Standing balance comment: required min guard for static standing balance and min A for dynamic standing balance due to altered balance strategies                            Cognition Arousal/Alertness: Awake/alert Behavior During Therapy: WFL for tasks assessed/performed Overall Cognitive Status: Within Functional Limits for tasks assessed  Exercises      General Comments General comments (skin integrity,  edema, etc.): pt required total A to don/doff CAM boot      Pertinent Vitals/Pain Pain Assessment: 0-10 Pain Score: 2  Pain Location: L foot Pain Descriptors / Indicators: Discomfort;Guarding;Heaviness Pain Intervention(s): Limited activity within patient's tolerance;Monitored during session;Premedicated before session;Repositioned    Home Living                          Prior Function            PT Goals (current goals can now be found in the care plan section) Acute Rehab PT Goals Patient Stated Goal: to get home ASAP PT Goal Formulation: With patient Time For Goal Achievement: 10/27/21 Potential to Achieve Goals: Good Progress towards PT goals: Progressing toward goals    Frequency    Min 3X/week      PT Plan Current plan remains appropriate    Co-evaluation              AM-PAC PT "6 Clicks" Mobility   Outcome Measure  Help needed turning from your back to your side while in a flat bed without using bedrails?: A Little Help needed moving from lying on your back to sitting on the side of a flat bed without using bedrails?: A Little Help needed moving to and from a bed to a chair (including a wheelchair)?: A Little Help needed standing up from a chair using your arms (e.g., wheelchair or bedside chair)?: A Little Help needed to walk in hospital room?: A Little Help needed climbing 3-5 steps with a railing? : A Lot 6 Click Score: 17    End of Session Equipment Utilized During Treatment: Gait belt;Other (comment) (L CAM boot) Activity Tolerance: Patient tolerated treatment well Patient left: in bed;with call bell/phone within reach Nurse Communication: Mobility status PT Visit Diagnosis: Unsteadiness on feet (R26.81);Muscle weakness (generalized) (M62.81);Difficulty in walking, not elsewhere classified (R26.2);Pain Pain - Right/Left: Left Pain - part of body: Leg     Time: 9937-1696 PT Time Calculation (min) (ACUTE ONLY): 21 min  Charges:   $Gait Training: 8-22 mins                     Becky Sax PT, DPT  Blenda Nicely 10/17/2021, 2:13 PM

## 2021-10-17 NOTE — Progress Notes (Signed)
Subjective: [name] is a 71 y.o. male patient seen at bedside, resting comfortably in no acute distress s/p day #1 left heel I&D  and bone biopsy performed by myself, Dr. Cannon Kettle. Patient is also s/p Left foot I&D on 1/9 and 1st metatarsal resection on 1/11 performed by Dr. March Rummage. Patient denies consitutional symptoms at this time except a little pain. No other concerns noted.  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[place]) 10/10/2021   Acute kidney injury superimposed on CKD ([place]) 10/10/2021   Anemia in chronic kidney disease 05/27/2020   Secondary hyperparathyroidism of renal origin ([place]) 02/25/2020   Diabetic foot ulcer ([place]) 10/07/2019   Benign hypertensive kidney disease with chronic kidney disease 06/17/2019   Proteinuria 06/17/2019   History of amputation of lesser toe of left foot ([place]) 08/18/2015   Hyperlipidemia, mixed 08/18/2015   Type 2 diabetes mellitus with peripheral neuropathy ([place]) 08/18/2015   Acute CVA (cerebrovascular accident) ([place]) 08/09/2015   Essential hypertriglyceridemia 08/09/2015   Hypertension, essential 08/09/2015   Right-sided muscle weakness 08/09/2015   T2DM (type 2 diabetes mellitus) ([place]) 08/09/2015   Tobacco abuse 08/09/2015   CKD (chronic kidney disease) stage 4, GFR 15-29 ml/min ([place]) 04/17/2013   DM2 (diabetes mellitus, type 2) ([place]) 04/17/2013   HTN (hypertension) 04/17/2013   Osteomyelitis of toe of left foot ([place]) 04/17/2013     Current Facility-Administered Medications:    (feeding supplement) PROSource Plus liquid 30 mL, 30 mL, Oral, BID BM, Evelina Bucy, DPM, 30 mL at 10/17/21 1208   0.9 %  sodium chloride infusion, , Intravenous, Continuous, Price, Christian Mate, DPM, Last Rate: 10 mL/hr at 10/16/21 1019, New Bag at 10/16/21 1019   acetaminophen (TYLENOL) tablet 650 mg, 650 mg, Oral, Q6H PRN, 650 mg at 10/14/21 2108 **OR** acetaminophen (TYLENOL) suppository 650 mg, 650 mg,  Rectal, Q6H PRN, Evelina Bucy, DPM   amLODipine (NORVASC) tablet 5 mg, 5 mg, Oral, Daily, Hongalgi, Anand D, MD, 5 mg at 10/17/21 8527   DAPTOmycin (CUBICIN) 750 mg in sodium chloride 0.9 % IVPB, 8 mg/kg, Intravenous, Q48H, Laurice Record, MD, Stopped at 10/15/21 2153   feeding supplement (NEPRO CARB STEADY) liquid 237 mL, 237 mL, Oral, BID BM, Evelina Bucy, DPM, 237 mL at 10/17/21 1208   heparin injection 5,000 Units, 5,000 Units, Subcutaneous, Q8H, Evelina Bucy, DPM, 5,000 Units at 10/17/21 1207   HYDROcodone-acetaminophen (NORCO/VICODIN) 5-325 MG per tablet 1 tablet, 1 tablet, Oral, Q6H PRN, Evelina Bucy, DPM, 1 tablet at 10/17/21 7824   HYDROmorphone (DILAUDID) injection 1 mg, 1 mg, Intravenous, Q4H PRN, Evelina Bucy, DPM, 1 mg at 10/14/21 0521   insulin aspart (novoLOG) injection 0-5 Units, 0-5 Units, Subcutaneous, QHS, Hongalgi, Anand D, MD, 3 Units at 10/16/21 2019   insulin aspart (novoLOG) injection 0-9 Units, 0-9 Units, Subcutaneous, TID WC, Hongalgi, Anand D, MD, 5 Units at 10/17/21 1251   insulin detemir (LEVEMIR) injection 10 Units, 10 Units, Subcutaneous, Daily, Hongalgi, Anand D, MD, 10 Units at 10/17/21 2353   multivitamin with minerals tablet 1 tablet, 1 tablet, Oral, Daily, Evelina Bucy, DPM, 1 tablet at 10/17/21 0828   ondansetron (ZOFRAN) tablet 4 mg, 4 mg, Oral, Q6H PRN **OR** ondansetron (ZOFRAN) injection 4 mg, 4 mg, Intravenous, Q6H PRN, Evelina Bucy, DPM   piperacillin-tazobactam (ZOSYN) IVPB 2.25 g, 2.25 g, Intravenous, Q8H, Evelina Bucy, DPM, Last Rate: 100 mL/hr at 10/17/21 1210, 2.25  g at 10/17/21 1210   pregabalin (LYRICA) capsule 75 mg, 75 mg, Oral, BID, Evelina Bucy, DPM, 75 mg at 10/17/21 2542   senna-docusate (Senokot-S) tablet 2 tablet, 2 tablet, Oral, Daily, Evelina Bucy, DPM, 2 tablet at 10/17/21 7062   sodium bicarbonate tablet 1,300 mg, 1,300 mg, Oral, BID, Hongalgi, Anand D, MD, 1,300 mg at 10/17/21 0828   sodium  zirconium cyclosilicate (LOKELMA) packet 10 g, 10 g, Oral, BID, Modena Jansky, MD, 10 g at 10/17/21 0829  No Known Allergies   Objective: Today's Vitals   10/17/21 0615 10/17/21 0811 10/17/21 0830 10/17/21 1231  BP:  136/65  (!) 151/68  Pulse:  100  81  Resp:  18  19  Temp:  98.6 F (37 C)  98.5 F (36.9 C)  TempSrc:  Oral  Oral  SpO2:  100%  100%  Weight:      Height:      PainSc: 10-Worst pain ever  0-No pain     General: No acute distress  Left lower extremity: Dressing to left foot clean, dry, intact. Minimal bloody strikethrough noted, Upon removal of dressings sutures and staples intact with no dehiscence at 1st metatarsal resection site. Decreased erythema, decreased edema, mild bloody drainage from left medial heel wound upon removal of the packing fatty tissue was noted no active purulence, wound edges appear to be healthy and viable, measures 4.5x4.5x3cm, minimal periwound maceration, no malodor, decreased erythema, minimal edema, minimal tenderness to palpation to the heel.  No calf pain.   Assessment and Plan:  Problem List Items Addressed This Visit       Endocrine   Diabetic foot ulcer (HCC)   Relevant Medications   insulin aspart (novoLOG) injection 0-9 Units   insulin aspart (novoLOG) injection 0-5 Units   insulin detemir (LEVEMIR) injection 10 Units     Other   * (Principal) Sepsis ([place])   Other Visit Diagnoses     Other acute osteomyelitis of left foot (HCC)    -  Primary   Relevant Medications   DAPTOmycin (CUBICIN) 750 mg in sodium chloride 0.9 % IVPB   AKI (acute kidney injury) ([place])       Relevant Orders   US RENAL (Completed)       -Patient seen and evaluated at bedside -Chart reviewed -Dressing change preformed; cleansed with saline solution and applied packing to medial heel wound and applied dry dressing to left foot -Advised patient to make sure to keep dressing clean, dry, and intact to left foot allowing nursing to change  dressings as ordered and to place WOUND VAC as ordered on tomorrow -Continue with antibiotics as recommended by infectious disease -Limited weightbearing to left foot with cam boot for bedside and bathroom only with assistive device -Continue with rest and elevation to assist with pain and edema control and heel offloading as previously ordered  -Nutrition consult placed; patient is interested in food and a diet plan that can help with wound healing  -Podiatry will continue to follow closely  -Anticipated discharge plan of care: To home in 2 days pending clinical improvement with wound vac and antibiotics. After discharge office will contact patient for follow up appointment.   Dr. [name], DPM [facility] [phone] office [phone] cell Available via secure chat

## 2021-10-18 DIAGNOSIS — R7989 Other specified abnormal findings of blood chemistry: Secondary | ICD-10-CM | POA: Diagnosis not present

## 2021-10-18 DIAGNOSIS — E875 Hyperkalemia: Secondary | ICD-10-CM | POA: Diagnosis not present

## 2021-10-18 DIAGNOSIS — E440 Moderate protein-calorie malnutrition: Secondary | ICD-10-CM | POA: Insufficient documentation

## 2021-10-18 DIAGNOSIS — N179 Acute kidney failure, unspecified: Secondary | ICD-10-CM | POA: Diagnosis not present

## 2021-10-18 DIAGNOSIS — N189 Chronic kidney disease, unspecified: Secondary | ICD-10-CM | POA: Diagnosis not present

## 2021-10-18 LAB — HEPATIC FUNCTION PANEL
ALT: 144 U/L — ABNORMAL HIGH (ref 0–44)
AST: 94 U/L — ABNORMAL HIGH (ref 15–41)
Albumin: 1.5 g/dL — ABNORMAL LOW (ref 3.5–5.0)
Alkaline Phosphatase: 177 U/L — ABNORMAL HIGH (ref 38–126)
Bilirubin, Direct: <0.1 mg/dL (ref 0.0–0.2)
Total Bilirubin: 0.3 mg/dL (ref 0.3–1.2)
Total Protein: 7.3 g/dL (ref 6.5–8.1)

## 2021-10-18 LAB — CBC
HCT: 25.4 % — ABNORMAL LOW (ref 39.0–52.0)
Hemoglobin: 8.2 g/dL — ABNORMAL LOW (ref 13.0–17.0)
MCH: 28.6 pg (ref 26.0–34.0)
MCHC: 32.3 g/dL (ref 30.0–36.0)
MCV: 88.5 fL (ref 80.0–100.0)
Platelets: 330 10*3/uL (ref 150–400)
RBC: 2.87 MIL/uL — ABNORMAL LOW (ref 4.22–5.81)
RDW: 14.3 % (ref 11.5–15.5)
WBC: 11.5 10*3/uL — ABNORMAL HIGH (ref 4.0–10.5)
nRBC: 0 % (ref 0.0–0.2)

## 2021-10-18 LAB — BASIC METABOLIC PANEL
Anion gap: 8 (ref 5–15)
BUN: 44 mg/dL — ABNORMAL HIGH (ref 8–23)
CO2: 19 mmol/L — ABNORMAL LOW (ref 22–32)
Calcium: 8 mg/dL — ABNORMAL LOW (ref 8.9–10.3)
Chloride: 106 mmol/L (ref 98–111)
Creatinine, Ser: 4.2 mg/dL — ABNORMAL HIGH (ref 0.61–1.24)
GFR, Estimated: 15 mL/min — ABNORMAL LOW (ref >60)
Glucose, Bld: 261 mg/dL — ABNORMAL HIGH (ref 70–99)
Potassium: 4.5 mmol/L (ref 3.5–5.1)
Sodium: 133 mmol/L — ABNORMAL LOW (ref 135–145)

## 2021-10-18 LAB — SURGICAL PATHOLOGY

## 2021-10-18 LAB — GLUCOSE, CAPILLARY
Glucose-Capillary: 197 mg/dL — ABNORMAL HIGH (ref 70–99)
Glucose-Capillary: 316 mg/dL — ABNORMAL HIGH (ref 70–99)
Glucose-Capillary: 316 mg/dL — ABNORMAL HIGH (ref 70–99)

## 2021-10-18 MED ORDER — AMOXICILLIN-POT CLAVULANATE 500-125 MG PO TABS
1.0000 | ORAL_TABLET | Freq: Two times a day (BID) | ORAL | Status: DC
  Administered 2021-10-18 – 2021-10-20 (×4): 500 mg via ORAL
  Filled 2021-10-18 (×6): qty 1

## 2021-10-18 MED ORDER — AMLODIPINE BESYLATE 10 MG PO TABS
10.0000 mg | ORAL_TABLET | Freq: Every day | ORAL | Status: DC
  Administered 2021-10-19 – 2021-10-20 (×2): 10 mg via ORAL
  Filled 2021-10-18 (×2): qty 1

## 2021-10-18 MED ORDER — ASPIRIN EC 81 MG PO TBEC
81.0000 mg | DELAYED_RELEASE_TABLET | Freq: Every day | ORAL | Status: DC
  Administered 2021-10-18 – 2021-10-20 (×3): 81 mg via ORAL
  Filled 2021-10-18 (×3): qty 1

## 2021-10-18 MED ORDER — INSULIN DETEMIR 100 UNIT/ML ~~LOC~~ SOLN
15.0000 [IU] | Freq: Two times a day (BID) | SUBCUTANEOUS | Status: DC
  Administered 2021-10-18 – 2021-10-20 (×4): 15 [IU] via SUBCUTANEOUS
  Filled 2021-10-18 (×5): qty 0.15

## 2021-10-18 NOTE — Progress Notes (Signed)
This nurse received verbal ok from [facility], MD for pt to have no IV in place.

## 2021-10-18 NOTE — Progress Notes (Signed)
Nutrition Follow-up  DOCUMENTATION CODES:   Non-severe (moderate) malnutrition in context of chronic illness  INTERVENTION:  -Continue Nepro Shake po BID, each supplement provides 425 kcal and 19 grams protein -Continue MVI with minerals daily -Continue PROSource PLUS PO 15mls BID, each supplement provides 100 kcals and 15 grams of protein -provided education on importance of adequate protein intake and how to improve kcal/protein intake at home  NUTRITION DIAGNOSIS:   Moderate Malnutrition related to chronic illness (type 2 DM uncontrolled, CKD) as evidenced by moderate fat depletion, moderate muscle depletion, mild fat depletion, mild muscle depletion. Updated  GOAL:   Patient will meet greater than or equal to 90% of their needs progressing  MONITOR:   PO intake, Supplement acceptance, Weight trends, Labs, I & O's  REASON FOR ASSESSMENT:   Consult Wound healing  ASSESSMENT:   Pt with PMH significant for type 2 DM, CKD stg 4, and HTN admitted with diabetic foot ulcer s/p amputation of 1st and 2nd toes on L foot.  1/09 - I&D below the deep fascia, complex - L foot 1/11 - I&D L foot wound, 1st metatarsal resection 1/13 - nephrology signed off 1/15 - I&D L heel wound and bone bx (pending)  Pt's liver chemistries continue to worsen per MD -- ID consulted to reassess and decision was made to change abx regimen and continue trending LFTs daily.   Pt with great intake/appetite since last RD assessment.Last 5 meals charted as 60-100% completion (84% avg meal intake). Reports doing well with ONS (Prosource Plus and Nepro). Recommend continue current nutrition plan of care given increased nutrient needs 2/2 wound healing.   Discussed intake both PTA and while admitted with pt and family who report pt with decreased appetite PTA but doing better now. Typically eats 2 meals per day but has been eating 3 meals per day here. Dicussed increased protein needs with pt/family and ways to  increase intake.   UOP: 2295ml x24 hours I/O: -3055ml since admit  Medications: Scheduled Meds:  (feeding supplement) PROSource Plus  30 mL Oral BID BM   amLODipine  5 mg Oral Daily   amoxicillin-clavulanate  1 tablet Oral Q12H   doxycycline  100 mg Oral Q12H   feeding supplement (NEPRO CARB STEADY)  237 mL Oral BID BM   heparin injection (subcutaneous)  5,000 Units Subcutaneous Q8H   insulin aspart  0-5 Units Subcutaneous QHS   insulin aspart  0-9 Units Subcutaneous TID WC   insulin detemir  10 Units Subcutaneous Daily   multivitamin with minerals  1 tablet Oral Daily   pregabalin  75 mg Oral BID   senna-docusate  2 tablet Oral Daily   sodium bicarbonate  1,300 mg Oral BID  Continuous Infusions:  sodium chloride 10 mL/hr at 10/16/21 1019    Labs: Recent Labs  Lab 10/12/21 0219 10/13/21 0349 10/16/21 0224 10/17/21 0110 10/18/21 0349  NA 129*   < > 131* 133* 133*  K 4.8   < > 5.2* 5.2* 4.5  CL 103   < > 107 107 106  CO2 17*   < > 18* 19* 19*  BUN 42*   < > 42* 43* 44*  CREATININE 4.61*   < > 4.63* 4.39* 4.20*  CALCIUM 7.3*   < > 7.7* 7.7* 8.0*  PHOS 3.5  --   --   --   --   GLUCOSE 145*   < > 241* 202* 261*   < > = values in this interval not displayed.  Elevated LFTs  CBGs: 197-265 x24 hours   NUTRITION - FOCUSED PHYSICAL EXAM: Flowsheet Row Most Recent Value  Orbital Region Moderate depletion  Upper Arm Region Moderate depletion  Thoracic and Lumbar Region Mild depletion  Buccal Region Mild depletion  Temple Region Moderate depletion  Clavicle Bone Region Moderate depletion  Clavicle and Acromion Bone Region Moderate depletion  Scapular Bone Region Mild depletion  Dorsal Hand Severe depletion  Patellar Region Moderate depletion  Anterior Thigh Region Severe depletion  Posterior Calf Region Severe depletion  Edema (RD Assessment) None  Hair Reviewed  Eyes Reviewed  Mouth Reviewed  Skin Reviewed  Nails Reviewed        Diet Order:   Diet Order              Diet renal/carb modified with fluid restriction Diet-HS Snack? Nothing; Fluid restriction: 1800 mL Fluid; Room service appropriate? Yes; Fluid consistency: Thin  Diet effective now                   EDUCATION NEEDS:   Education needs have been addressed  Skin:  Skin Assessment: Skin Integrity Issues: Skin Integrity Issues:: Diabetic Ulcer, Incisions Diabetic Ulcer: L heel Incisions: L foot x2  Last BM:  1/16  Height:   Ht Readings from Last 1 Encounters:  10/12/21 6' (1.829 m)    Weight:   Wt Readings from Last 3 Encounters:  10/12/21 94.3 kg  08/10/20 94.3 kg  12/08/19 95 kg    BMI:  Body mass index is 28.21 kg/m.  Estimated Nutritional Needs:  Kcal:  2300-2500 Protein:  115-130 grams Fluid:  >2L     Theone Stanley., MS, RD, LDN (she/her/hers) RD pager number and weekend/on-call pager number located in [place].

## 2021-10-18 NOTE — Progress Notes (Signed)
PROGRESS NOTE  [name] TFT:[id number] DOB: 20-Dec-1950 DOA: 10/10/2021 PCP: [facility]  HPI/Recap of past 24 hours: [name] is a 71 y.o. male with medical history significant of DM2, CKD4, HTN.  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  Who presented to [facility] ED from home with ongoing ulceration of ball area of L foot and worsening renal function.  Followed by podiatry, with concern for osteomyelitis.  Admitted for left foot gas gangrene and AKI on CKD 4.  Post I&D on 10/10/2021 by podiatry Dr March Rummage.  Patient also seen by nephrology, Dr. Hollie Salk.  S/p left foot wound I&D and left first metatarsal resection 1/11.  Continues to spike high fevers despite broad-spectrum IV antibiotics, infectious disease consulted for assistance on 1/12.  S/p left heel wound debridement with irrigation and left heel bone biopsy 1/15.  ID changed antibiotics to oral.  Podiatry plans wound VAC placement 1/17.  Possible DC home 1/18.  Assessment/Plan: Principal Problem:   Sepsis ([place]) Active Problems:   CKD (chronic kidney disease) stage 4, GFR 15-29 ml/min (HCC)   History of amputation of lesser toe of left foot (HCC)   HTN (hypertension)   Osteomyelitis of toe of left foot (HCC)   Diabetic foot ulcer ([place])   Gas gangrene of foot (HCC)   Acute kidney injury superimposed on CKD (HCC)   Malnutrition of moderate degree  Sepsis secondary to left foot osteomyelitis/gas gangrene, post I&D 1/9 and 1/11 and left first metatarsal resection 1/11 On empiric IV Zosyn since 1/8. ID discontinued linezolid and started daptomycin and 1/12. Blood cultures x2: Negative to date. Wound culture 1/9: No group A strep or staph aureus isolated. Left foot abscess culture 1/9: Few Streptococcus anginosus and few Proteus mirabilis, pansensitive Had ongoing fever spikes up to 102.7 and thereby ID was consulted.  MRI Infectious disease consultation appreciated.  MRI of left foot  revealed postsurgical changes.  MRI of left ankle shows possible phlegmon/developing abscess 2.3 x 1.1 x 2.2 cm along the superior margin of medial hindfoot ulcer.  Planter fasciitis versus early acute osteomyelitis.  Myositis.  Elongated IM abscess within the proximal abductor halluces muscle measuring 3.7 x 0.8 x 1.7 cm. S/p left heel wound I&D and left heel bone biopsy 1/15 ID recommended orthopedic consultation for calcaneal osteomyelitis.  However podiatry discussed with patient and he does not want to lose the leg or have a amputation at this time but he is aware that there are risks (I confirmed this with patient and his spouse at bedside).  Current plan is for long-term antibiotics and wound care. Bone biopsy culture 1/15: Needs to be followed up.  Not back yet. As per podiatry follow-up 1/17: Dressing change performed.  Plan for wound VAC placement 1/17.  Limited weightbearing to left foot with Cam boot.  Dietitian has consulted.  Recommend possible DC in 2 days pending clinical improvement.  AKI on CKD 4, the setting of sepsis and recent Bactrim administration, improving Baseline creatinine appears to be 4 Presented with creatinine greater than 5 Nephrology assisted and signed off.  Suspected AKI due to sepsis and recent Bactrim use. UA relatively bland.  Renal ultrasound without obstruction. Creatinine had peaked at 5.25 on 1/9.  Since 1/10, creatinine improved slightly but has plateaued in the high 4 range.  Down to 4.2 on 1/17. Avoid nephrotoxic agents, hypotension and IV contrast unless absolutely necessary. Continue to hold off torsemide/ARB/SGLT2 inhibitors Trend daily BMP.  Clinically euvolemic.  Off IV fluids. As per nephrology, has outpatient appointment with Dr. Juleen China on 10/20/2021 (this appears to have been rescheduled by patient).  Nephrology recommends holding olmesartan until he sees his nephrologist and can resume home torsemide at discharge.  Nephrology signed off  1/13.  Non anion gap metabolic acidosis in the setting of AKI Continue trending daily BMP.  Bicarb mildly low but stable overall.  Normal anion gap.  Nephrology assisted with care and signed off on 1/13.  Started sodium bicarbonate tablets.  Hyperkalemia: Mild.  Lokelma 10 g x 1 dose without change.  Repeated Lokelma 10 g twice daily x2 doses 1/16.  Initiated sodium bicarb.  Changed diet to renal/carb modified.  Resolved.  Follow BMP in AM.  Hyponatremia Likely due to AKI.  Trend daily BMP. Stable.  Elevated liver chemistries, unclear etiology Higher than on 1/12.?  Related to daptomycin.  Continue to gradually worsen.  Communicated with ID, who will reassess for today and if it looks clean, plan to transition to Augmentin and doxycycline.  No GI symptoms.  Starting to improve.  Daptomycin discontinued 1/16.  LFTs in AM.  Hypertension No ARB or diuretics at this time. Mildly uncontrolled.  Increased amlodipine back to home dose 10 Mg daily.  Monitor.  Type 2 diabetes with hyperglycemia Hemoglobin A1c 8.3 on 10/10/2021. Uncontrolled.  Patient on much higher doses of 70/30 insulin at home.  Increase Levemir to 15 units twice daily, continue SSI.  Adjust insulins while here and DC home on prior home dose.  Anemia of chronic disease Hemoglobin stable in the 8 g range for several days now.    Code Status: Full code Family Communication: Spouse at bedside on a daily basis including today. Disposition Plan: Possible DC home 1/18 pending clinical improvement.  Consultants: Nephrology Podiatry  Procedures: Post I&D on 10/10/2021 and 1/11. I&D of left heel wound and left heel bone biopsy 1/15  Antimicrobials: IV Zosyn 1/8 > discontinued IV linezolid-discontinued IV daptomycin 1/12 > discontinued Oral Augmentin and doxycycline 1/16 >  DVT prophylaxis: Subcu heparin 3 times daily  Status is: Inpatient  Patient requires at least 2 midnights for further evaluation and treatment of  present condition.  Subjective: Denies complaints.  No foot pain reported.  Seen this morning.  Objective: Vitals:   10/17/21 0811 10/17/21 1231 10/17/21 2025 10/18/21 0800  BP: 136/65 (!) 151/68 (!) 158/74 (!) 154/73  Pulse: 100 81 81 84  Resp: 18 19 18 16   Temp: 98.6 F (37 C) 98.5 F (36.9 C) 98.2 F (36.8 C) 98 F (36.7 C)  TempSrc: Oral Oral Oral Oral  SpO2: 100% 100% 98% 100%  Weight:      Height:        Intake/Output Summary (Last 24 hours) at 10/18/2021 1748 Last data filed at 10/18/2021 0818 Gross per 24 hour  Intake --  Output 1950 ml  Net -1950 ml   Filed Weights   10/10/21 0026 10/12/21 1002  Weight: 93 kg 94.3 kg    Exam:  General exam: Middle-age male, moderately built and nourished lying comfortably propped up in bed without distress. Respiratory system: Clear to auscultation. Respiratory effort normal. Cardiovascular system: S1 and S2 heard, RRR.  No JVD, murmurs or pedal edema. Gastrointestinal system: Abdomen is nondistended, soft and nontender. No organomegaly or masses felt. Normal bowel sounds heard. Central nervous system: Alert and oriented. No focal neurological deficits. Extremities: Left foot postop dressing clean and dry.  Had cam boot on. Skin: No rashes,  lesions or ulcers Psychiatry: Judgement and insight appear normal. Mood & affect appropriate.     Data Reviewed: CBC: Recent Labs  Lab 10/13/21 0349 10/14/21 0613 10/15/21 0141 10/17/21 0110 10/18/21 0349  WBC 22.5* 18.1* 15.6* 13.8* 11.5*  HGB 8.1* 9.0* 8.0* 8.3* 8.2*  HCT 25.1* 28.2* 25.0* 26.9* 25.4*  MCV 87.5 88.7 88.7 89.1 88.5  PLT 304 358 335 372 891   Basic Metabolic Panel: Recent Labs  Lab 10/12/21 0219 10/13/21 0349 10/14/21 0613 10/15/21 0141 10/16/21 0224 10/17/21 0110 10/18/21 0349  NA 129*   < > 133* 133* 131* 133* 133*  K 4.8   < > 4.4 4.7 5.2* 5.2* 4.5  CL 103   < > 109 106 107 107 106  CO2 17*   < > 20* 17* 18* 19* 19*  GLUCOSE 145*   < > 225* 228*  241* 202* 261*  BUN 42*   < > 40* 40* 42* 43* 44*  CREATININE 4.61*   < > 4.52* 4.26* 4.63* 4.39* 4.20*  CALCIUM 7.3*   < > 7.3* 7.6* 7.7* 7.7* 8.0*  PHOS 3.5  --   --   --   --   --   --    < > = values in this interval not displayed.   GFR: Estimated Creatinine Clearance: 20.1 mL/min (A) (by C-G formula based on SCr of 4.2 mg/dL (H)). Liver Function Tests: Recent Labs  Lab 10/12/21 0219 10/13/21 0349 10/16/21 0224 10/17/21 0110 10/18/21 0349  AST  --  69* 140* 181* 94*  ALT  --  55* 138* 186* 144*  ALKPHOS  --  128* 205* 212* 177*  BILITOT  --  0.5 0.2* 0.5 0.3  PROT  --  6.8 7.1 7.7 7.3  ALBUMIN 1.5* <1.5* <1.5* 1.6* 1.5*    HbA1C: No results for input(s): HGBA1C in the last 72 hours.  CBG: Recent Labs  Lab 10/17/21 1229 10/17/21 1520 10/17/21 2018 10/18/21 0806 10/18/21 1638  GLUCAP 267* 295* 265* 197* 316*   Urine analysis:    Component Value Date/Time   COLORURINE YELLOW 10/10/2021 2033   APPEARANCEUR CLEAR 10/10/2021 2033   LABSPEC 1.015 10/10/2021 2033   PHURINE 6.0 10/10/2021 2033   GLUCOSEU NEGATIVE 10/10/2021 2033   HGBUR MODERATE (A) 10/10/2021 2033   BILIRUBINUR NEGATIVE 10/10/2021 2033   KETONESUR NEGATIVE 10/10/2021 2033   PROTEINUR 100 (A) 10/10/2021 2033   NITRITE NEGATIVE 10/10/2021 2033   LEUKOCYTESUR NEGATIVE 10/10/2021 2033    Recent Results (from the past 240 hour(s))  Aerobic/Anaerobic Culture w Gram Stain (surgical/deep wound)     Status: None   Collection Time: 10/10/21 12:20 AM   Specimen: PATH Other; Tissue  Result Value Ref Range Status   Specimen Description WOUND  Final   Special Requests FIRST METATARSAL BONE  Final   Gram Stain NO WBC SEEN NO ORGANISMS SEEN   Final   Culture   Final    No growth aerobically or anaerobically. Performed at [facility], [place] [address] [place], [place] [address]    Report Status 10/17/2021 FINAL  Final  Resp Panel by RT-PCR (Flu A&B, Covid) Nasopharyngeal Swab     Status: None    Collection Time: 10/10/21 12:28 AM   Specimen: Nasopharyngeal Swab; Nasopharyngeal(NP) swabs in vial transport medium  Result Value Ref Range Status   SARS Coronavirus 2 by RT PCR NEGATIVE NEGATIVE Final    Comment: (NOTE) SARS-CoV-2 target nucleic acids are NOT DETECTED.  The SARS-CoV-2 RNA  is generally detectable in upper respiratory specimens during the acute phase of infection. The lowest concentration of SARS-CoV-2 viral copies this assay can detect is 138 copies/mL. A negative result does not preclude SARS-Cov-2 infection and should not be used as the sole basis for treatment or other patient management decisions. A negative result may occur with  improper specimen collection/handling, submission of specimen other than nasopharyngeal swab, presence of viral mutation(s) within the areas targeted by this assay, and inadequate number of viral copies(<138 copies/mL). A negative result must be combined with clinical observations, patient history, and epidemiological information. The expected result is Negative.  Fact Sheet for Patients:  EntrepreneurPulse.com.au  Fact Sheet for Healthcare Providers:  IncredibleEmployment.be  This test is no t yet approved or cleared by the [place] FDA and  has been authorized for detection and/or diagnosis of SARS-CoV-2 by FDA under an Emergency Use Authorization (EUA). This EUA will remain  in effect (meaning this test can be used) for the duration of the COVID-19 declaration under Section 564(b)(1) of the Act, 21 U.S.C.section 360bbb-3(b)(1), unless the authorization is terminated  or revoked sooner.       Influenza A by PCR NEGATIVE NEGATIVE Final   Influenza B by PCR NEGATIVE NEGATIVE Final    Comment: (NOTE) The Xpert Xpress SARS-CoV-2/FLU/RSV plus assay is intended as an aid in the diagnosis of influenza from Nasopharyngeal swab specimens and should not be used as a sole basis for treatment.  Nasal washings and aspirates are unacceptable for Xpert Xpress SARS-CoV-2/FLU/RSV testing.  Fact Sheet for Patients: EntrepreneurPulse.com.au  Fact Sheet for Healthcare Providers: IncredibleEmployment.be  This test is not yet approved or cleared by the [place] FDA and has been authorized for detection and/or diagnosis of SARS-CoV-2 by FDA under an Emergency Use Authorization (EUA). This EUA will remain in effect (meaning this test can be used) for the duration of the COVID-19 declaration under Section 564(b)(1) of the Act, 21 U.S.C. section 360bbb-3(b)(1), unless the authorization is terminated or revoked.  Performed at [facility], [place] [address] [place], [place] [address]   Blood culture (routine x 2)     Status: None   Collection Time: 10/10/21  1:00 AM   Specimen: BLOOD  Result Value Ref Range Status   Specimen Description BLOOD LEFT ARM  Final   Special Requests   Final    BOTTLES DRAWN AEROBIC AND ANAEROBIC Blood Culture adequate volume   Culture   Final    NO GROWTH 5 DAYS Performed at [facility], [place] [address], [place], [place] [address]    Report Status 10/15/2021 FINAL  Final  Blood culture (routine x 2)     Status: None   Collection Time: 10/10/21  1:05 AM   Specimen: BLOOD  Result Value Ref Range Status   Specimen Description BLOOD RIGHT ARM  Final   Special Requests   Final    BOTTLES DRAWN AEROBIC AND ANAEROBIC Blood Culture results may not be optimal due to an inadequate volume of blood received in culture bottles   Culture   Final    NO GROWTH 5 DAYS Performed at [facility], [place] [address] [place], [place] [address]    Report Status 10/15/2021 FINAL  Final  Aerobic Culture w Gram Stain (superficial specimen)     Status: None   Collection Time: 10/10/21  6:24 AM   Specimen: Wound  Result Value Ref Range Status   Specimen Description WOUND  Final   Special  Requests SINOSP  Final   Gram  Stain   Final    ABUNDANT WBC PRESENT, PREDOMINANTLY PMN MODERATE GRAM POSITIVE COCCI    Culture   Final    RARE MULTIPLE ORGANISMS PRESENT, NONE PREDOMINANT NO GROUP A STREP (S.PYOGENES) ISOLATED NO STAPHYLOCOCCUS AUREUS ISOLATED Performed at [facility], [place] [address], [place], [place] [address]    Report Status 10/12/2021 FINAL  Final  Aerobic/Anaerobic Culture w Gram Stain (surgical/deep wound)     Status: None   Collection Time: 10/10/21  9:49 PM   Specimen: Abscess; Wound  Result Value Ref Range Status   Specimen Description ABSCESS LEFT FOOT  Final   Special Requests NONE  Final   Gram Stain   Final    FEW WBC PRESENT, PREDOMINANTLY PMN FEW GRAM POSITIVE COCCI IN CHAINS    Culture   Final    FEW STREPTOCOCCUS ANGINOSIS FEW PROTEUS MIRABILIS FEW PREVOTELLA BIVIA BETA LACTAMASE POSITIVE Performed at [facility], [address] [place], [place] [address]    Report Status 10/16/2021 FINAL  Final   Organism ID, Bacteria STREPTOCOCCUS ANGINOSIS  Final   Organism ID, Bacteria PROTEUS MIRABILIS  Final      Susceptibility   Proteus mirabilis - MIC*    AMPICILLIN <=2 SENSITIVE Sensitive     CEFAZOLIN 8 SENSITIVE Sensitive     CEFEPIME <=0.12 SENSITIVE Sensitive     CEFTAZIDIME <=1 SENSITIVE Sensitive     CEFTRIAXONE <=0.25 SENSITIVE Sensitive     CIPROFLOXACIN <=0.25 SENSITIVE Sensitive     GENTAMICIN <=1 SENSITIVE Sensitive     IMIPENEM 2 SENSITIVE Sensitive     TRIMETH/SULFA <=20 SENSITIVE Sensitive     AMPICILLIN/SULBACTAM <=2 SENSITIVE Sensitive     PIP/TAZO <=4 SENSITIVE Sensitive     * FEW PROTEUS MIRABILIS   Streptococcus anginosis - MIC*    PENICILLIN <=0.06 SENSITIVE Sensitive     CEFTRIAXONE <=0.12 SENSITIVE Sensitive     ERYTHROMYCIN <=0.12 SENSITIVE Sensitive     LEVOFLOXACIN 1 SENSITIVE Sensitive     VANCOMYCIN 0.5 SENSITIVE Sensitive     * FEW STREPTOCOCCUS ANGINOSIS  MRSA Next Gen by PCR, Nasal     Status: None   Collection Time:  10/11/21  5:54 AM   Specimen: Nasal Mucosa; Nasal Swab  Result Value Ref Range Status   MRSA by PCR Next Gen NOT DETECTED NOT DETECTED Final    Comment: (NOTE) The GeneXpert MRSA Assay (FDA approved for NASAL specimens only), is one component of a comprehensive MRSA colonization surveillance program. It is not intended to diagnose MRSA infection nor to guide or monitor treatment for MRSA infections. Test performance is not FDA approved in patients less than 78 years old. Performed at [facility], [place] [address] [place], [place] [address]       Studies: No results found.  Scheduled Meds:  (feeding supplement) PROSource Plus  30 mL Oral BID BM   amLODipine  5 mg Oral Daily   amoxicillin-clavulanate  1 tablet Oral Q12H   doxycycline  100 mg Oral Q12H   feeding supplement (NEPRO CARB STEADY)  237 mL Oral BID BM   heparin injection (subcutaneous)  5,000 Units Subcutaneous Q8H   insulin aspart  0-5 Units Subcutaneous QHS   insulin aspart  0-9 Units Subcutaneous TID WC   insulin detemir  10 Units Subcutaneous Daily   multivitamin with minerals  1 tablet Oral Daily   pregabalin  75 mg Oral BID   senna-docusate  2 tablet Oral Daily   sodium bicarbonate  1,300 mg Oral BID    Continuous Infusions:  sodium chloride 10 mL/hr at 10/16/21 1019     LOS: 8 days    Vernell Leep, MD,  FACP, [facility], [facility], [facility] (Care Management Physician Certified) [place]  To contact the attending provider between 7A-7P or the covering provider during after hours 7P-7A, please log into the web site [email or web address] and access using universal [place] password for that web site. If you do not have the password, please call the hospital operator.

## 2021-10-18 NOTE — Plan of Care (Signed)
°  Problem: Education: Goal: Knowledge of General Education information will improve Description: Including pain rating scale, medication(s)/side effects and non-pharmacologic comfort measures Outcome: Progressing   Problem: Health Behavior/Discharge Planning: Goal: Ability to manage health-related needs will improve Outcome: Progressing   Problem: Clinical Measurements: Goal: Respiratory complications will improve Outcome: Progressing   Problem: Clinical Measurements: Goal: Cardiovascular complication will be avoided Outcome: Progressing

## 2021-10-18 NOTE — Discharge Instructions (Signed)
[facility] Stay Proper nutrition can help your body recover from illness and injury.   Foods and beverages high in protein, vitamins, and minerals help rebuild muscle loss, promote healing, & reduce fall risk.   In addition to eating healthy foods, a nutrition shake is an easy, delicious way to get the nutrition you need during and after your hospital stay  It is recommended that you continue to drink 2 bottles per day of:       Premier Protein or Equate drinks  for at least 1 month (30 days) after your hospital stay  Can also continue Prosource Plus as you were taking while admitted   Juven can be purchased/ordered if healing not occurring with increased protein intake  Tips for adding a nutrition shake into your routine: As allowed, drink one with vitamins or medications instead of water or juice Enjoy one as a tasty mid-morning or afternoon snack Drink cold or make a milkshake out of it Drink one instead of milk with cereal or snacks Use as a coffee creamer   Available at the following grocery stores and pharmacies:           * [place] [phone]            For COUPONS visit: www.ensure.com/join or http://dawson-may.com/   Suggested Substitutions Ensure Plus = Boost Plus = Carnation Breakfast Essentials = Boost Compact Ensure Active Clear = Boost Breeze Glucerna Shake = Boost Glucose Control = Carnation Breakfast Essentials SUGAR FREE     Suggestions For Increasing Calories And Protein Several small meals a day are easier to eat and digest than three large ones. Space meals about 2 to 3 hours apart to maximize comfort. Stop eating 2 to 3 hours before bed and sleep with your head elevated if gastric reflux (GERD) and heartburn are problems. Do not eat your favorite foods if you are feeling bad.  Save them for when you feel good! Eat breakfast-type foods at any meal. Eggs are usually easy to eat and are great any time of the day. (The same goes for pancakes and waffles.) Eat when you feel hungry. Most people have the greatest appetite in the morning because they have not eaten all night. If this is the best meal for you, then pile on those calories and other nutrients in the morning and at lunch. Then you can have a smaller dinner without losing total calories for the day. Eat leftovers or nutritious snacks in the afternoon and early evening to round out your day. Try homemade or commercially prepared nutrition bars and puddings, as well as calorie- and protein-rich liquid nutritional supplements. Benefits of Physical Activity Talk to your doctor about physical activity. Light or moderate physical activity can help maintain muscle and promote an appetite. Walking in the neighborhood or the local mall is a great way to get up, get out, and get moving. If you are unsteady on your feet, try walking around the dining room table. Save Room for [employer]! Drink most fluids between meals instead of with meals. (It is fine to have a sip to help swallow food at meal time.) Fluids (which usually have fewer calories and nutrients than solid food)  can take up valuable space in your stomach.  Foods Recommended High-Protein Foods Milk products Add cheese to toast, crackers, sandwiches, baked potatoes, vegetables, soups, noodles, meat, and fruit. Use reduced-fat (2%) or whole milk in place of water when cooking cereal and cream soups. Include cream sauces on vegetables and pasta. Add powdered milk to cream soups and mashed potatoes.  Eggs Have hard-cooked eggs readily available in the refrigerator. Chop and add to salads, casseroles, soups, and vegetables. Make a quick egg salad. All eggs should be well cooked to avoid the risk of harmful bacteria.  Meats, poultry, and fish Add leftover cooked meats  to soups, casseroles, salads, and omelets. Make dip by mixing diced, chopped, or shredded meat with sour cream and spices.  Beans, legumes, nuts, and seeds Sprinkle nuts and seeds on cereals, fruit, and desserts such as ice cream, pudding, and custard. Also serve nuts and seeds on vegetables, salads, and pasta. Spread peanut butter on toast, bread, English muffins, and fruit, or blend it in a milk shake. Add beans and peas to salads, soups, casseroles, and vegetable dishes.  High-Calorie Foods Butter, margarine, and  oils Melt butter or margarine over potatoes, rice, pasta, and cooked vegetables. Add melted butter or margarine into soups and casseroles and spread on bread for sandwiches before spreading sandwich spread or peanut butter. Saut or stir-fry vegetables, meats, chicken and fish such as shrimp/scallops in olive or canola oil. A variety of oils add calories and can be used to [employer], chicken, or fish.  Milk products Add whipping cream to desserts, pancakes, waffles, fruit, and hot chocolate, and fold it into soups and casseroles. Add sour cream to baked potatoes and vegetables.  Salad dressing Use regular (not low-fat or diet) mayonnaise and salad dressing on sandwiches and in dips with vegetables and fruit.   Sweets Add jelly and honey to bread and crackers. Add jam to fruit and ice cream and as a topping over cake.   Copyright 2020  Academy of [employer] and Dietetics. All rights reserved.

## 2021-10-19 DIAGNOSIS — N189 Chronic kidney disease, unspecified: Secondary | ICD-10-CM | POA: Diagnosis not present

## 2021-10-19 DIAGNOSIS — N179 Acute kidney failure, unspecified: Secondary | ICD-10-CM | POA: Diagnosis not present

## 2021-10-19 LAB — COMPREHENSIVE METABOLIC PANEL
ALT: 123 U/L — ABNORMAL HIGH (ref 0–44)
AST: 61 U/L — ABNORMAL HIGH (ref 15–41)
Albumin: 1.6 g/dL — ABNORMAL LOW (ref 3.5–5.0)
Alkaline Phosphatase: 177 U/L — ABNORMAL HIGH (ref 38–126)
Anion gap: 11 (ref 5–15)
BUN: 52 mg/dL — ABNORMAL HIGH (ref 8–23)
CO2: 19 mmol/L — ABNORMAL LOW (ref 22–32)
Calcium: 8.1 mg/dL — ABNORMAL LOW (ref 8.9–10.3)
Chloride: 104 mmol/L (ref 98–111)
Creatinine, Ser: 4.12 mg/dL — ABNORMAL HIGH (ref 0.61–1.24)
GFR, Estimated: 15 mL/min — ABNORMAL LOW (ref >60)
Glucose, Bld: 196 mg/dL — ABNORMAL HIGH (ref 70–99)
Potassium: 4.7 mmol/L (ref 3.5–5.1)
Sodium: 134 mmol/L — ABNORMAL LOW (ref 135–145)
Total Bilirubin: 0.2 mg/dL — ABNORMAL LOW (ref 0.3–1.2)
Total Protein: 7 g/dL (ref 6.5–8.1)

## 2021-10-19 LAB — GLUCOSE, CAPILLARY
Glucose-Capillary: 138 mg/dL — ABNORMAL HIGH (ref 70–99)
Glucose-Capillary: 212 mg/dL — ABNORMAL HIGH (ref 70–99)
Glucose-Capillary: 230 mg/dL — ABNORMAL HIGH (ref 70–99)
Glucose-Capillary: 241 mg/dL — ABNORMAL HIGH (ref 70–99)

## 2021-10-19 LAB — CBC
HCT: 24.2 % — ABNORMAL LOW (ref 39.0–52.0)
Hemoglobin: 7.8 g/dL — ABNORMAL LOW (ref 13.0–17.0)
MCH: 28.5 pg (ref 26.0–34.0)
MCHC: 32.2 g/dL (ref 30.0–36.0)
MCV: 88.3 fL (ref 80.0–100.0)
Platelets: 362 10*3/uL (ref 150–400)
RBC: 2.74 MIL/uL — ABNORMAL LOW (ref 4.22–5.81)
RDW: 14.5 % (ref 11.5–15.5)
WBC: 11.4 10*3/uL — ABNORMAL HIGH (ref 4.0–10.5)
nRBC: 0 % (ref 0.0–0.2)

## 2021-10-19 MED ORDER — DOXYCYCLINE HYCLATE 100 MG PO TABS: 100.0000 mg | ORAL_TABLET | Freq: Two times a day (BID) | ORAL | 0 refills | Status: AC

## 2021-10-19 MED ORDER — AMOXICILLIN-POT CLAVULANATE 500-125 MG PO TABS: 1.0000 | ORAL_TABLET | Freq: Two times a day (BID) | ORAL | 0 refills | Status: AC

## 2021-10-19 MED ORDER — HYDROCODONE-ACETAMINOPHEN 5-325 MG PO TABS: 1.0000 | ORAL_TABLET | Freq: Four times a day (QID) | ORAL | 0 refills | Status: DC | PRN

## 2021-10-19 NOTE — Progress Notes (Addendum)
Wound vac has been delivered to pt at bedside. Wound vac will be placed on pt tomorrow 1/19 by wound care nurse.

## 2021-10-19 NOTE — Progress Notes (Signed)
Physical Therapy Treatment Patient Details Name: [name] MRN: [id number] DOB: 1951/08/15 Today's Date: 10/19/2021   History of Present Illness 71 yo male  was admitted 1/9 for sepsis and received I and D on L foot.  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  ABT contines, has dx sepsis, AKI, fevers, non anion gap metabolic acidosis, anemia.  PMHx:  DM, CKD4, HTN, diabetic foot ulcer, amp 1st and 2nd toes L foot,    PT Comments    Received pt supine in bed with wife present at bedside. Session with emphasis on functional mobility, gait training, and LE strengthening. Pt performed bed mobility mod I using bed features and transferred with RW and min guard. Pt ambulated throughout hallway with RW and min guard - difficulty WB through heel only. Pt reports having 2 STE with no rails at home - dicussed strategies for stair navigation but pt stated that "he and his wife have it covered". Performed standing theraex (LLE marching, hip abduction, knee flexion and RLE single leg heel raises) with BUE support on RW and min guard with caution to adhere to WB precautions. Left sitting EOB with wife and all needs within reach. Acute PT to cont to follow.     Recommendations for follow up therapy are one component of a multi-disciplinary discharge planning process, led by the attending physician.  Recommendations may be updated based on patient status, additional functional criteria and insurance authorization.  Follow Up Recommendations  Home health PT     Assistance Recommended at Discharge Frequent or constant Supervision/Assistance  Patient can return home with the following A little help with walking and/or transfers;A little help with bathing/dressing/bathroom;Assistance with cooking/housework;Help with stairs or ramp for entrance;Assist for transportation   Equipment Recommendations  Rolling walker (2 wheels)    Recommendations for Other Services        Precautions / Restrictions Precautions Precautions: Fall Required Braces or Orthoses: Other Brace Other Brace: L CAM boot Restrictions Weight Bearing Restrictions: Yes LLE Weight Bearing: Partial weight bearing LLE Partial Weight Bearing Percentage or Pounds: through heel only     Mobility  Bed Mobility Overal bed mobility: Modified Independent Bed Mobility: Rolling, Supine to Sit Rolling: Modified independent (Device/Increase time)   Supine to sit: Modified independent (Device/Increase time)     General bed mobility comments: HOB elevated and use of bedrails Patient Response: Cooperative  Transfers Overall transfer level: Needs assistance Equipment used: Rolling walker (2 wheels) Transfers: Sit to/from Stand Sit to Stand: Min guard           General transfer comment: min guard to stand from EOB (adjusted to height of pt's bed at home)    Ambulation/Gait Ambulation/Gait assistance: Min guard Gait Distance (Feet): 100 Feet Assistive device: Rolling walker (2 wheels) Gait Pattern/deviations: Decreased stride length, Decreased weight shift to left, Decreased step length - right, Decreased step length - left, Trunk flexed, Narrow base of support, Step-to pattern, Decreased stance time - left Gait velocity: decreased Gait velocity interpretation: <1.8 ft/sec, indicate of risk for recurrent falls   General Gait Details: difficulty ambulating with WB through heel only due to weight of CAM boot   Stairs             Wheelchair Mobility    Modified Rankin (Stroke Patients Only)       Balance Overall balance assessment: Needs assistance Sitting-balance support: Feet supported, No upper extremity supported Sitting balance-Leahy Scale: Good Sitting balance - Comments: sitting EOB  talking with wife upon therapist's exit   Standing balance support: Bilateral upper extremity supported (RW) Standing balance-Leahy Scale: Poor Standing balance comment: required  close supervision for static standing balance and min guard for dynamic standing balance                            Cognition Arousal/Alertness: Awake/alert Behavior During Therapy: WFL for tasks assessed/performed Overall Cognitive Status: Within Functional Limits for tasks assessed                                          Exercises General Exercises - Lower Extremity Hip ABduction/ADduction: AROM, Left, 10 reps, Standing Hip Flexion/Marching: AROM, Left, 10 reps, Standing Heel Raises: AROM, Strengthening, Right, 10 reps, Standing    General Comments General comments (skin integrity, edema, etc.): pt hopeful to D/C home today      Pertinent Vitals/Pain Pain Assessment Pain Assessment: No/denies pain (just c/o "soreness")    Home Living                          Prior Function            PT Goals (current goals can now be found in the care plan section) Acute Rehab PT Goals Patient Stated Goal: to get home ASAP PT Goal Formulation: With patient Time For Goal Achievement: 10/27/21 Potential to Achieve Goals: Good Progress towards PT goals: Progressing toward goals    Frequency    Min 3X/week      PT Plan Current plan remains appropriate    Co-evaluation              AM-PAC PT "6 Clicks" Mobility   Outcome Measure  Help needed turning from your back to your side while in a flat bed without using bedrails?: None Help needed moving from lying on your back to sitting on the side of a flat bed without using bedrails?: None Help needed moving to and from a bed to a chair (including a wheelchair)?: A Little Help needed standing up from a chair using your arms (e.g., wheelchair or bedside chair)?: A Little Help needed to walk in hospital room?: A Little Help needed climbing 3-5 steps with a railing? : A Lot 6 Click Score: 19    End of Session Equipment Utilized During Treatment: Gait belt;Other (comment) (L CAM  boot) Activity Tolerance: Patient tolerated treatment well Patient left: in bed;with family/visitor present Nurse Communication: Mobility status PT Visit Diagnosis: Unsteadiness on feet (R26.81);Muscle weakness (generalized) (M62.81);Difficulty in walking, not elsewhere classified (R26.2)     Time: 3491-7915 PT Time Calculation (min) (ACUTE ONLY): 15 min  Charges:  $Gait Training: 8-22 mins                    Becky Sax PT, DPT  Blenda Nicely 10/19/2021, 10:27 AM

## 2021-10-19 NOTE — TOC Progression Note (Addendum)
Transition of Care [facility]) - Progression Note    Patient Details  Name: [name] MRN: [id number] Date of Birth: 1951-06-09  Transition of [facility]) CM/SW Contact  Sharin Mons, RN Phone Number: 10/19/2021, 12:43 PM  Clinical Narrative:    Authorization received from [employer] for wound vac. Per Traci with [employer] a copay will be required for device. States [employer] tried calling wife on yesterday to share information,call unsuccessful. NCM called [employer]  @ pt's bedside and copay information was given to pt by [employer] rep. [employer] rep. shared copay will be $ 285.82/ month in order to receive device. Pt stated he can't afford copay cost . [employer] rep.stated they will close account and they don't have any charity offerings.  Referral made with Tonya/ [employer] [phone]) for wound vac, hoping they will not require copayment ....authorization pending, MD made aware. TOC team monitoring and will continue to assist with needs.  10/19/2021 @ 1530 NCM received call from Jeneen Rinks @ [employer] informing NCM auth. received for wound vac and device will be delivered by 5pm today, NCM made md,pt and nurse aware.  Expected Discharge Plan: [place] Barriers to Discharge: Continued Medical Work up  Expected Discharge Plan and Services Expected Discharge Plan: [place]   Discharge Planning Services: CM Consult   Living arrangements for the past 2 months: Single Family Home                           HH Arranged: RN [facility] Agency: [place] Date [place]: 10/14/21 Time [place]: [phone] Representative spoke with at [place]: [place] ([place]) Interventions    Readmission Risk Interventions No flowsheet data found.

## 2021-10-19 NOTE — Progress Notes (Signed)
Inpatient Diabetes Program Recommendations  AACE/ADA: New Consensus Statement on Inpatient Glycemic Control (2015)  Target Ranges:  Prepandial:   less than 140 mg/dL      Peak postprandial:   less than 180 mg/dL (1-2 hours)      Critically ill patients:  140 - 180 mg/dL   Lab Results  Component Value Date   GLUCAP 138 (H) 10/19/2021   HGBA1C 8.3 (H) 10/10/2021    Review of Glycemic Control  Latest Reference Range & Units 10/18/21 08:06 10/18/21 16:38 10/18/21 19:52 10/19/21 05:48  Glucose-Capillary 70 - 99 mg/dL 197 (H) 316 (H) 316 (H) 138 (H)  (H): Data is abnormally high  Diabetes history: DM2 Outpatient Diabetes medications: 70/30 32 BID Current orders for Inpatient glycemic control: Levemir 15 BID, Novolog 0-9 units TID and 0-5 QHS  Inpatient Diabetes Program Recommendations:    Postprandials elevated.  Please add:  Novolog 5 units TID with meals if eats at least 50%  Will continue to follow while inpatient.  Thank you, Reche Dixon, MSN, RN Diabetes Coordinator Inpatient Diabetes Program [phone] (team pager from 8a-5p)

## 2021-10-19 NOTE — Progress Notes (Signed)
Mobility Specialist Criteria Algorithm Info.    10/19/21 0938  Mobility  Bed Position Semi-fowlers  Activity Ambulated with assistance in hallway  Range of Motion/Exercises Active;All extremities  Level of Assistance Standby assist, set-up cues, supervision of patient - no hands on  Assistive Device Front wheel walker  LLE Weight Bearing PWB  LLE Partial Weight Bearing Percentage or Pounds  (Through heel only)  Distance Ambulated (ft) 120 ft  Activity Response Tolerated well   Patient receive in bed agreeable to participate. Ambulated in hallway supervision level with steady gait. Tolerated ambulation well without complaint or incident. Was left lying supine in bed with all needs met.   10/19/2021 09:38 AM  [place] Yulonda [place], [place], [place]  ZWCHE:[phone] Office: [phone]

## 2021-10-19 NOTE — Progress Notes (Signed)
PROGRESS NOTE  Azahel Belcastro PPI:[id number] DOB: 1951/02/12 DOA: 10/10/2021 PCP: [facility]   LOS: 9 days   Brief Narrative / Interim history: [name] is a 71 y.o. male with medical history significant of DM2, CKD4, HTN.  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  Who presented to [facility] ED from home with ongoing ulceration of ball area of L foot and worsening renal function.  Followed by podiatry, with concern for osteomyelitis.  Admitted for left foot gas gangrene and AKI on CKD 4.  Post I&D on 10/10/2021 by podiatry Dr March Rummage.  Patient also seen by nephrology, Dr. Hollie Salk.  S/p left foot wound I&D and left first metatarsal resection 1/11.  Continues to spike high fevers despite broad-spectrum IV antibiotics, infectious disease consulted for assistance on 1/12.  S/p left heel wound debridement with irrigation and left heel bone biopsy 1/15.  ID changed antibiotics to oral.   Subjective / 24h Interval events: Doing well, hoping to get the wound vac today to go home  Assessment & Plan: Principal problem Sepsis secondary to left foot osteomyelitis/gas gangrene, post I&D 1/9 and 1/11 and left first metatarsal resection 1/11 -he was initially admitted to the hospital and placed on broad-spectrum antibiotics.  ID consulted and followed patient while hospitalized. Left foot abscess culture 1/9: Few Streptococcus anginosus and few Proteus mirabilis, pansensitive.  Due to persistent fevers underwent an MRI of the left ankle which showed possible phlegmon/developing abscess 2.3 x 1.1 x 2.2 cm along the superior margin of medial hindfoot ulcer and elongated IM abscess within the proximal abductor halluces muscle measuring 3.7 x 0.8 x 1.7 cm.  Podiatry consulted and he is status post left heel wound I&D and left heel bone biopsy 1/15.  ID now recommends amoxicillin as well as doxycycline for total of 6 weeks until February 25  Active problems AKI on CKD 4, the  setting of sepsis and recent Bactrim administration, improving -Baseline creatinine appears to be 4, Presented with creatinine greater than 5 Nephrology assisted and signed off.  Suspected AKI due to sepsis and recent Bactrim use. UA relatively bland.  Renal ultrasound without obstruction.  Nephrology consulted and followed patient while hospitalized, his creatinine is now stable and will be followed up as an outpatient.   Non anion gap metabolic acidosis in the setting of AKI -stable  Hyperkalemia-Repeat Lokelma, stable   Hyponatremia -Likely due to AKI.  stable   Elevated liver chemistries, unclear etiology -possibly related to daptomycin, it has been discontinued and LFTs are now improving.  Hypertension -Home medications   Type 2 diabetes with hyperglycemia -Hemoglobin A1c 8.3 on 10/10/2021.   Anemia of chronic disease end of chronic renal disease-no bleeding, overall stable   Scheduled Meds:  (feeding supplement) PROSource Plus  30 mL Oral BID BM   amLODipine  10 mg Oral Daily   amoxicillin-clavulanate  1 tablet Oral Q12H   aspirin EC  81 mg Oral Daily   doxycycline  100 mg Oral Q12H   feeding supplement (NEPRO CARB STEADY)  237 mL Oral BID BM   heparin injection (subcutaneous)  5,000 Units Subcutaneous Q8H   insulin aspart  0-5 Units Subcutaneous QHS   insulin aspart  0-9 Units Subcutaneous TID WC   insulin detemir  15 Units Subcutaneous BID   multivitamin with minerals  1 tablet Oral Daily   pregabalin  75 mg Oral BID   senna-docusate  2 tablet Oral Daily  sodium bicarbonate  1,300 mg Oral BID   Continuous Infusions: PRN Meds:.acetaminophen **OR** acetaminophen, HYDROcodone-acetaminophen, HYDROmorphone (DILAUDID) injection, ondansetron **OR** ondansetron (ZOFRAN) IV  Diet Orders (From admission, onward)     Start     Ordered   10/17/21 0538  Diet renal/carb modified with fluid restriction Diet-HS Snack? Nothing; Fluid restriction: 1800 mL Fluid; Room service  appropriate? Yes; Fluid consistency: Thin  Diet effective now       Question Answer Comment  Diet-HS Snack? Nothing   Fluid restriction: 1800 mL Fluid   Room service appropriate? Yes   Fluid consistency: Thin      10/17/21 0537            DVT prophylaxis: heparin injection 5,000 Units Start: 10/11/21 1600   Lab Results  Component Value Date   PLT 362 10/19/2021      Code Status: Full Code  Family Communication: family at bedside   Status is: Inpatient  Remains inpatient appropriate because: waiting Vac delivery and placement  Level of care: Med-Surg  Consultants:  ID Podiatry   Objective: Vitals:   10/18/21 0800 10/18/21 1953 10/19/21 0700 10/19/21 1300  BP: (!) 154/73 (!) 179/77 (!) 145/71 (!) 158/78  Pulse: 84 88 76 88  Resp: 16 17 18 18   Temp: 98 F (36.7 C) 98.7 F (37.1 C) 98.5 F (36.9 C) 98.1 F (36.7 C)  TempSrc: Oral Oral Oral Oral  SpO2: 100% 99% 100% 99%  Weight:      Height:        Intake/Output Summary (Last 24 hours) at 10/19/2021 1610 Last data filed at 10/19/2021 1300 Gross per 24 hour  Intake 480 ml  Output 1200 ml  Net -720 ml   Wt Readings from Last 3 Encounters:  10/12/21 94.3 kg  08/10/20 94.3 kg  12/08/19 95 kg    Examination:  Constitutional: NAD Eyes: no scleral icterus ENMT: Mucous membranes are moist.  Neck: normal, supple Respiratory: clear to auscultation bilaterally, no wheezing, no crackles.  Cardiovascular: Regular rate and rhythm, no murmurs / rubs / gallops.  Abdomen: non distended, no tenderness. Bowel sounds positive.  Musculoskeletal: no clubbing / cyanosis.  Skin: no rashes Neurologic: non focal    Data Reviewed: I have independently reviewed following labs and imaging studies  CBC Recent Labs  Lab 10/14/21 0613 10/15/21 0141 10/17/21 0110 10/18/21 0349 10/19/21 0146  WBC 18.1* 15.6* 13.8* 11.5* 11.4*  HGB 9.0* 8.0* 8.3* 8.2* 7.8*  HCT 28.2* 25.0* 26.9* 25.4* 24.2*  PLT 358 335 372 330 362   MCV 88.7 88.7 89.1 88.5 88.3  MCH 28.3 28.4 27.5 28.6 28.5  MCHC 31.9 32.0 30.9 32.3 32.2  RDW 14.3 14.5 14.5 14.3 14.5    Recent Labs  Lab 10/13/21 0349 10/14/21 0613 10/15/21 0141 10/16/21 0224 10/17/21 0110 10/18/21 0349 10/19/21 0146  NA 131*   < > 133* 131* 133* 133* 134*  K 4.7   < > 4.7 5.2* 5.2* 4.5 4.7  CL 106   < > 106 107 107 106 104  CO2 18*   < > 17* 18* 19* 19* 19*  GLUCOSE 135*   < > 228* 241* 202* 261* 196*  BUN 40*   < > 40* 42* 43* 44* 52*  CREATININE 4.84*   < > 4.26* 4.63* 4.39* 4.20* 4.12*  CALCIUM 7.4*   < > 7.6* 7.7* 7.7* 8.0* 8.1*  AST 69*  --   --  140* 181* 94* 61*  ALT 55*  --   --  138* 186* 144* 123*  ALKPHOS 128*  --   --  205* 212* 177* 177*  BILITOT 0.5  --   --  0.2* 0.5 0.3 0.2*  ALBUMIN <1.5*  --   --  <1.5* 1.6* 1.5* 1.6*   < > = values in this interval not displayed.    ------------------------------------------------------------------------------------------------------------------ No results for input(s): CHOL, HDL, LDLCALC, TRIG, CHOLHDL, LDLDIRECT in the last 72 hours.  Lab Results  Component Value Date   HGBA1C 8.3 (H) 10/10/2021   ------------------------------------------------------------------------------------------------------------------ No results for input(s): TSH, T4TOTAL, T3FREE, THYROIDAB in the last 72 hours.  Invalid input(s): FREET3  Cardiac Enzymes No results for input(s): CKMB, TROPONINI, MYOGLOBIN in the last 168 hours.  Invalid input(s): CK ------------------------------------------------------------------------------------------------------------------ No results found for: BNP  CBG: Recent Labs  Lab 10/18/21 0806 10/18/21 1638 10/18/21 1952 10/19/21 0548 10/19/21 1127  GLUCAP 197* 316* 316* 138* 212*    Recent Results (from the past 240 hour(s))  Aerobic/Anaerobic Culture w Gram Stain (surgical/deep wound)     Status: None   Collection Time: 10/10/21 12:20 AM   Specimen: PATH Other; Tissue   Result Value Ref Range Status   Specimen Description WOUND  Final   Special Requests FIRST METATARSAL BONE  Final   Gram Stain NO WBC SEEN NO ORGANISMS SEEN   Final   Culture   Final    No growth aerobically or anaerobically. Performed at [facility], [place] [address], [place], [place] [address]    Report Status 10/17/2021 FINAL  Final  Resp Panel by RT-PCR (Flu A&B, Covid) Nasopharyngeal Swab     Status: None   Collection Time: 10/10/21 12:28 AM   Specimen: Nasopharyngeal Swab; Nasopharyngeal(NP) swabs in vial transport medium  Result Value Ref Range Status   SARS Coronavirus 2 by RT PCR NEGATIVE NEGATIVE Final    Comment: (NOTE) SARS-CoV-2 target nucleic acids are NOT DETECTED.  The SARS-CoV-2 RNA is generally detectable in upper respiratory specimens during the acute phase of infection. The lowest concentration of SARS-CoV-2 viral copies this assay can detect is 138 copies/mL. A negative result does not preclude SARS-Cov-2 infection and should not be used as the sole basis for treatment or other patient management decisions. A negative result may occur with  improper specimen collection/handling, submission of specimen other than nasopharyngeal swab, presence of viral mutation(s) within the areas targeted by this assay, and inadequate number of viral copies(<138 copies/mL). A negative result must be combined with clinical observations, patient history, and epidemiological information. The expected result is Negative.  Fact Sheet for Patients:  EntrepreneurPulse.com.au  Fact Sheet for Healthcare Providers:  IncredibleEmployment.be  This test is no t yet approved or cleared by the [place] FDA and  has been authorized for detection and/or diagnosis of SARS-CoV-2 by FDA under an Emergency Use Authorization (EUA). This EUA will remain  in effect (meaning this test can be used) for the duration of the COVID-19 declaration under  Section 564(b)(1) of the Act, 21 U.S.C.section 360bbb-3(b)(1), unless the authorization is terminated  or revoked sooner.       Influenza A by PCR NEGATIVE NEGATIVE Final   Influenza B by PCR NEGATIVE NEGATIVE Final    Comment: (NOTE) The Xpert Xpress SARS-CoV-2/FLU/RSV plus assay is intended as an aid in the diagnosis of influenza from Nasopharyngeal swab specimens and should not be used as a sole basis for treatment. Nasal washings and aspirates are unacceptable for Xpert Xpress SARS-CoV-2/FLU/RSV testing.  Fact Sheet for Patients: EntrepreneurPulse.com.au  Fact  Sheet for Healthcare Providers: IncredibleEmployment.be  This test is not yet approved or cleared by the [place] and has been authorized for detection and/or diagnosis of SARS-CoV-2 by FDA under an Emergency Use Authorization (EUA). This EUA will remain in effect (meaning this test can be used) for the duration of the COVID-19 declaration under Section 564(b)(1) of the Act, 21 U.S.C. section 360bbb-3(b)(1), unless the authorization is terminated or revoked.  Performed at [facility], [place] [address], [place], [place] [address]   Blood culture (routine x 2)     Status: None   Collection Time: 10/10/21  1:00 AM   Specimen: BLOOD  Result Value Ref Range Status   Specimen Description BLOOD LEFT ARM  Final   Special Requests   Final    BOTTLES DRAWN AEROBIC AND ANAEROBIC Blood Culture adequate volume   Culture   Final    NO GROWTH 5 DAYS Performed at [facility], [place] [address], [place], [place] [address]    Report Status 10/15/2021 FINAL  Final  Blood culture (routine x 2)     Status: None   Collection Time: 10/10/21  1:05 AM   Specimen: BLOOD  Result Value Ref Range Status   Specimen Description BLOOD RIGHT ARM  Final   Special Requests   Final    BOTTLES DRAWN AEROBIC AND ANAEROBIC Blood Culture results may not be optimal due to an inadequate volume of  blood received in culture bottles   Culture   Final    NO GROWTH 5 DAYS Performed at [facility], [place] [address], [place], [place] [address]    Report Status 10/15/2021 FINAL  Final  Aerobic Culture w Gram Stain (superficial specimen)     Status: None   Collection Time: 10/10/21  6:24 AM   Specimen: Wound  Result Value Ref Range Status   Specimen Description WOUND  Final   Special Requests SINOSP  Final   Gram Stain   Final    ABUNDANT WBC PRESENT, PREDOMINANTLY PMN MODERATE GRAM POSITIVE COCCI    Culture   Final    RARE MULTIPLE ORGANISMS PRESENT, NONE PREDOMINANT NO GROUP A STREP (S.PYOGENES) ISOLATED NO STAPHYLOCOCCUS AUREUS ISOLATED Performed at [facility], [place] [address], [place], [place] [address]    Report Status 10/12/2021 FINAL  Final  Aerobic/Anaerobic Culture w Gram Stain (surgical/deep wound)     Status: None   Collection Time: 10/10/21  9:49 PM   Specimen: Abscess; Wound  Result Value Ref Range Status   Specimen Description ABSCESS LEFT FOOT  Final   Special Requests NONE  Final   Gram Stain   Final    FEW WBC PRESENT, PREDOMINANTLY PMN FEW GRAM POSITIVE COCCI IN CHAINS    Culture   Final    FEW STREPTOCOCCUS ANGINOSIS FEW PROTEUS MIRABILIS FEW PREVOTELLA BIVIA BETA LACTAMASE POSITIVE Performed at [facility], [address] [place], [place] [address]    Report Status 10/16/2021 FINAL  Final   Organism ID, Bacteria STREPTOCOCCUS ANGINOSIS  Final   Organism ID, Bacteria PROTEUS MIRABILIS  Final      Susceptibility   Proteus mirabilis - MIC*    AMPICILLIN <=2 SENSITIVE Sensitive     CEFAZOLIN 8 SENSITIVE Sensitive     CEFEPIME <=0.12 SENSITIVE Sensitive     CEFTAZIDIME <=1 SENSITIVE Sensitive     CEFTRIAXONE <=0.25 SENSITIVE Sensitive     CIPROFLOXACIN <=0.25 SENSITIVE Sensitive     GENTAMICIN <=1 SENSITIVE  Sensitive     IMIPENEM 2 SENSITIVE Sensitive     TRIMETH/SULFA <=20 SENSITIVE Sensitive     AMPICILLIN/SULBACTAM <=2  SENSITIVE Sensitive     PIP/TAZO <=4 SENSITIVE Sensitive     * FEW PROTEUS MIRABILIS   Streptococcus anginosis - MIC*    PENICILLIN <=0.06 SENSITIVE Sensitive     CEFTRIAXONE <=0.12 SENSITIVE Sensitive     ERYTHROMYCIN <=0.12 SENSITIVE Sensitive     LEVOFLOXACIN 1 SENSITIVE Sensitive     VANCOMYCIN 0.5 SENSITIVE Sensitive     * FEW STREPTOCOCCUS ANGINOSIS  MRSA Next Gen by PCR, Nasal     Status: None   Collection Time: 10/11/21  5:54 AM   Specimen: Nasal Mucosa; Nasal Swab  Result Value Ref Range Status   MRSA by PCR Next Gen NOT DETECTED NOT DETECTED Final    Comment: (NOTE) The GeneXpert MRSA Assay (FDA approved for NASAL specimens only), is one component of a comprehensive MRSA colonization surveillance program. It is not intended to diagnose MRSA infection nor to guide or monitor treatment for MRSA infections. Test performance is not FDA approved in patients less than 23 years old. Performed at [facility], [place] [address] [place],  [address]      Radiology Studies: No results found.   Marzetta Board, MD, PhD [facility]  Between 7 am - 7 pm I am available, please contact me via Amion (for emergencies) or Securechat (non urgent messages)  Between 7 pm - 7 am I am not available, please contact night coverage MD/APP via Amion

## 2021-10-19 NOTE — Plan of Care (Signed)

## 2021-10-20 ENCOUNTER — Ambulatory Visit: Payer: Medicare HMO | Admitting: Sports Medicine

## 2021-10-20 DIAGNOSIS — N179 Acute kidney failure, unspecified: Secondary | ICD-10-CM | POA: Diagnosis not present

## 2021-10-20 DIAGNOSIS — N189 Chronic kidney disease, unspecified: Secondary | ICD-10-CM | POA: Diagnosis not present

## 2021-10-20 LAB — GLUCOSE, CAPILLARY
Glucose-Capillary: 230 mg/dL — ABNORMAL HIGH (ref 70–99)
Glucose-Capillary: 314 mg/dL — ABNORMAL HIGH (ref 70–99)

## 2021-10-20 NOTE — Progress Notes (Signed)
Patient taught discharge education and belongings collected

## 2021-10-20 NOTE — Care Management Important Message (Signed)
Important Message  Patient Details  Name: [name] MRN: [id number] Date of Birth: 08/27/1951   Medicare Important Message Given:  Yes     [name] 10/20/2021, 12:19 PM

## 2021-10-20 NOTE — Discharge Summary (Signed)
Physician Discharge Summary  [name] MYT:[id number] DOB: 07-08-1951 DOA: 10/10/2021  PCP: [facility] date: 10/10/2021 Discharge date: 10/20/2021  Admitted From: home Disposition:  home  Recommendations for Outpatient Follow-up:  Follow up with PCP in 1-2 weeks Follow up with ID, nephrology, podiatry as an outpatient  Home Health: PT Equipment/Devices: wound vac  Discharge Condition: stable CODE STATUS: Full code Diet recommendation: renal  HPI: Per admitting MD, [name] is a 71 y.o. male with medical history significant of DM2, CKD stage 4 (GFR 17 as of Nov), HTN.  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 Pt with ongoing ulceration of ball area of L foot (see photo below).  Seeing podiatry for this (last saw in office 1/5).  Podiatrist concerned due to X ray showing ? Osteo of the bone under ulcer area, had ordered MRI, started pt on bactrim. Wound culture on 1/5 showing Citrobacter koseri, pan sensitive including to bactrim. Despite ABx.  Pt developed pain extending up the medial and plantar aspect of his foot, purulent discharge, fever up to 104.x at home, fever is improved with tylenol he took before coming to ED.  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  ID consulted and followed patient while hospitalized. Left foot abscess culture 1/9: Few Streptococcus anginosus and few Proteus mirabilis, pansensitive.  Due to persistent fevers underwent an MRI of the left ankle which showed possible phlegmon/developing abscess 2.3 x 1.1 x 2.2 cm along the superior margin of medial hindfoot ulcer and elongated IM abscess within the proximal abductor halluces muscle measuring 3.7 x 0.8 x 1.7 cm.  Podiatry  consulted and he is status post left heel wound I&D and left heel bone biopsy 1/15.  ID now recommends amoxicillin as well as doxycycline for total of 6 weeks until February 25  Active problems AKI on CKD 4, the setting of sepsis and recent Bactrim administration, improving -Baseline creatinine appears to be 4, Presented with creatinine greater than 5 Nephrology assisted and signed off.  Suspected AKI due to sepsis and recent Bactrim use. UA relatively bland.  Renal ultrasound without obstruction.  Nephrology consulted and followed patient while hospitalized, his creatinine is now stable and will be followed up as an outpatient. Non anion gap metabolic acidosis in the setting of AKI -stable Hyperkalemia-Repeat Lokelma, stable Hyponatremia -Likely due to AKI.  stable Elevated liver chemistries, unclear etiology -possibly related to daptomycin, it has been discontinued and LFTs are now improving. Hypertension -Home medications Type 2 diabetes with hyperglycemia -Hemoglobin A1c 8.3 on 10/10/2021. Anemia of chronic disease end of chronic renal disease-no bleeding, overall stable   Discharge Instructions   Allergies as of 10/20/2021   No Known Allergies      Medication List     STOP taking these medications    sulfamethoxazole-trimethoprim 800-160 MG tablet Commonly known as: BACTRIM DS       TAKE these medications    amLODipine 10 MG tablet Commonly known as: NORVASC Take 10 mg by mouth daily.   amoxicillin-clavulanate 500-125 MG tablet Commonly known as: AUGMENTIN Take 1 tablet (500 mg total) by mouth every 12 (twelve) hours.   aspirin EC 81 MG tablet Take 81 mg by mouth daily. Swallow whole.   atorvastatin 80 MG tablet Commonly known  as: LIPITOR Take 80 mg by mouth daily.   docusate sodium 100 MG capsule Commonly known as: COLACE Take 200 mg by mouth daily.   doxycycline 100 MG tablet Commonly known as: VIBRA-TABS Take 1 tablet (100 mg total) by mouth every 12  (twelve) hours.   Dulaglutide 4.5 MG/0.5ML Sopn Inject 4.5 mg into the skin every Wednesday.   glucose blood test strip daily.   HYDROcodone-acetaminophen 5-325 MG tablet Commonly known as: NORCO/VICODIN Take 1 tablet by mouth every 6 (six) hours as needed for moderate pain.   NovoLOG Mix 70/30 FlexPen (70-30) 100 UNIT/ML FlexPen Generic drug: insulin aspart protamine - aspart Inject 32 Units into the skin 2 (two) times daily with a meal.   onetouch ultrasoft lancets daily.   pregabalin 75 MG capsule Commonly known as: LYRICA Take 75 mg by mouth 2 (two) times daily.   torsemide 20 MG tablet Commonly known as: DEMADEX Take 20 mg by mouth daily.   True Metrix Meter w/Device Kit   UltiCare Mini Pen Needles 31G X 6 MM Misc Generic drug: Insulin Pen Needle   UNABLE TO FIND daily.        Follow-up Information     [facility] Follow up.   Contact information: [address] [place] [address] [phone]         [facility] Follow up.   Specialty: [place] Why: the office will call to schedule home health visits Contact information: [address] [place] [address] [phone]         [facility] Follow up on 11/03/2021.   Why: 9:30 am with Dr. Thedore Mins information: [address], [place], [place] [address] Phone: [phone]               Procedures/Studies:  US RENAL  Result Date: 10/23/21 CLINICAL DATA:  Acute kidney injury EXAM: RENAL / URINARY TRACT ULTRASOUND COMPLETE COMPARISON:  11/20/2018 FINDINGS: Right Kidney: Renal measurements: 9.5 x 4.9 x 5.3 cm = volume: 129.3 mL. Cortex is echogenic. Trace perinephric fluid. No hydronephrosis. Upper pole cyst measuring 3.7 cm. Midpole parapelvic cyst measuring 14 mm. Left Kidney: Renal measurements: 9 x 5.1 x 4.8 cm = volume: 110.3 mL. Cortex is echogenic. No hydronephrosis. Small cyst lower pole measuring 15  mm. Bladder: Appears normal for degree of bladder distention. Other: None. IMPRESSION: 1. Echogenic kidneys consistent with medical renal disease. No hydronephrosis 2. Simple appearing renal cysts. Electronically Signed   By: Donavan Foil M.D.   On: October 23, 2021 15:43   MR FOOT LEFT WO CONTRAST  Result Date: 10/14/2021 CLINICAL DATA:  Osteomyelitis. Status post left foot irrigation and debridement as well as amputation of the left first metatarsal. Admitted Oct 23, 2022 for sepsis. 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 EXAM: MRI OF THE LEFT FOOT WITHOUT CONTRAST TECHNIQUE: Multiplanar, multisequence MR imaging of the left forefoot was performed. No intravenous contrast was administered. COMPARISON:  Left foot radiographs 10/06/2021 and October 23, 2021, MRI left forefoot 10/11/2021 FINDINGS: Bones/Joint/Cartilage There is metallic artifact seen throughout the distal medial forefoot in the region of new interval first ray amputation compared to 10-23-21. There is now amputation to the distal shaft of the first metatarsal, where previously there was amputation to the proximal aspect of the proximal phalanx. There is moderate regional soft tissue swelling of the mid to distal medial aspect of the foot and surrounding the postsurgical aspect of the resected distal first metatarsal. There  is mild expected erosion of the stump of the postsurgical first metatarsal shaft consistent with recent surgery. Mild marrow edema throughout the remaining shaft of the first metatarsal. There is mild fluid within the soft tissues at the distal amputation stump. There is metallic artifact overlying the distal shaft of second metatarsal where there is again amputation to this level. There is again partial amputation of the third metatarsal head and adjacent base of the proximal phalanx of the third toe. Ligaments The Lisfranc ligament complex is intact. Muscles and Tendons No tendon tear is identified. Soft tissues Mild  edema within the plantar foot musculature. Mild soft tissue edema and swelling of the dorsal lateral midfoot. IMPRESSION:: IMPRESSION: 1. Interval further amputation of the first ray, now to the distal shaft. Expected postoperative changes including soft tissue swelling and mild fluid around the amputation stump. 2. More remote amputations of the distal second and third metatarsals, as above. Electronically Signed   By: [name] M.D.   On: 10/14/2021 15:21   MR FOOT LEFT WO CONTRAST  Result Date: 10/11/2021 CLINICAL DATA:  Chronic left foot ulcer.  Foot pain and swelling. EXAM: MRI OF THE LEFT FOOT WITHOUT CONTRAST TECHNIQUE: Multiplanar, multisequence MR imaging of the left foot was performed. No intravenous contrast was administered. 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 Soft tissue wound along the distal plantar medial aspect of the first TMT joint with a sinus tract extending into the soft tissues with focal area of inflammatory soft tissue along the plantar aspect of the foot concerning for a phlegmon. Heterogeneous low signal material within the area of the phlegmon likely reflecting packing material. Severe bone marrow edema in the first metatarsal head and sesamoids concerning for osteomyelitis. No other marrow signal abnormality. Normal alignment. No joint effusion. No marrow signal abnormality. Ligaments Collateral ligaments are intact.  Lisfranc ligament is intact. Muscles and Tendons Flexor, peroneal and extensor compartment tendons are intact. Muscles are normal. Soft tissue No fluid collection or hematoma.  No soft tissue mass. IMPRESSION: 1. 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 Soft tissue wound along the distal plantar medial aspect of the first TMT joint with a sinus tract extending into the soft tissues with focal area of inflammatory soft tissue along the plantar aspect of the foot concerning  for a phlegmon. Heterogeneous low signal material within the area of the phlegmon likely reflecting packing material. 2. Severe bone marrow edema in the first metatarsal head and sesamoids concerning for osteomyelitis. Electronically Signed   By: Kathreen Devoid M.D.   On: 10/11/2021 10:49   MR ANKLE LEFT WO CONTRAST  Result Date: 10/15/2021 CLINICAL DATA:  Diabetic foot ulcer EXAM: MRI OF THE LEFT ANKLE WITHOUT CONTRAST TECHNIQUE: Multiplanar, multisequence MR imaging of the ankle was performed. No intravenous contrast was administered. COMPARISON:  X-ray 10/10/2021.  MRI forefoot 10/14/2021 FINDINGS: TENDONS Peroneal: Peroneus longus and brevis tendons are intact and normally positioned. Posteromedial: Tibialis posterior, flexor hallucis longus, and flexor digitorum longus tendons are intact and normally positioned. Anterior: Tibialis anterior, extensor hallucis longus, and extensor digitorum longus tendons are intact and normally positioned. Achilles: Intact. Plantar Fascia: Thickening of the central band of the plantar fascia without definite tear. LIGAMENTS Lateral: Intact tibiofibular ligaments. The anterior and posterior talofibular ligaments are intact. Intact calcaneofibular ligament. Medial: The deltoid and visualized portions of the spring ligament appear intact. CARTILAGE AND BONES Ankle Joint: No tibiotalar joint effusion. The talar dome and tibial  plafond are intact. Subtalar Joints/Sinus Tarsi: No cartilage defect. No effusion. Preservation of the anatomic fat within the sinus tarsi. Bones: Minimal subcortical bone marrow edema within the plantar-medial aspect of the calcaneus at the attachment site of the medial band of the plantar fascia (series 3, image 20). No confluent low T1 marrow signal. No focal erosion. The remaining osseous structures demonstrate preserved bone marrow signal. No fracture or malalignment. Mild midfoot arthropathy. Other: Deep soft tissue ulceration at the medial aspect of  the hindfoot. Surrounding soft tissue edema at the ulcer base. Ill-defined fluid collection along the superior margin of the ulceration measures approximately 2.3 x 1.1 x 2.2 cm (series 3, image 20). Additionally there is a elongated fluid collection within the proximal abductor hallucis muscle measuring 3.7 x 0.8 x 1.7 cm (series 9, image 14). Diffusely increased T2 signal throughout the visualized lower leg and foot musculature. IMPRESSION: 1. Deep soft tissue ulceration at the medial aspect of the hindfoot with surrounding soft tissue edema. Ill-defined fluid collection along the superior margin of the ulceration measures 2.3 x 1.1 x 2.2 cm and is most compatible with a phlegmon/developing abscess. 2. Minimal subcortical bone marrow edema within the plantar-medial aspect of the calcaneus at the attachment site of the medial band of the plantar fascia. Marrow signal may be reactive secondary to plantar fasciitis or represent early changes of acute osteomyelitis given proximity to the ulceration. 3. Diffusely increased signal throughout the visualized musculature suggesting myositis. There is an elongated intramuscular abscess within the proximal abductor hallucis muscle measuring 3.7 x 0.8 x 1.7 cm. Electronically Signed   By: Davina Poke D.O.   On: 10/15/2021 13:08   DG Foot Complete Left  Result Date: 10/10/2021 CLINICAL DATA:  Infection, concern for osteomyelitis. EXAM: LEFT FOOT - COMPLETE 3+ VIEW COMPARISON:  10/06/2021. FINDINGS: There has been amputation of the first and second digits and heads of the second and third metatarsals. Soft tissue swelling is seen predominantly in the medial aspect of the midfoot and plantar aspect of the hindfoot. There is deformity of the skin along the base of the forefoot, possible ulceration. No acute fracture or dislocation. Bony erosions are present along the inferior aspect of the presumed first metatarsal on the lateral view. Mild calcaneal spurring is noted.  Vascular calcifications are present in the soft tissues. IMPRESSION: 1. Soft tissue swelling with subcutaneous air in the medial and plantar aspect of the mid and hindfoot, compatible with history of infection. 2. Suspected ulceration along the plantar aspect of the forefoot. There are bony erosions along the plantar aspect of the presumed first metatarsal on the lateral view, concerning for osteomyelitis. MRI is suggested for further evaluation. Electronically Signed   By: Brett Fairy M.D.   On: 10/10/2021 01:29   VAS [place] ABI WITH/WO TBI  Result Date: 10/10/2021  LOWER EXTREMITY DOPPLER STUDY Patient Name:  [name]  Date of Exam:   10/10/2021 Medical Rec #: [id number]      Accession #:    [id number] Date of Birth: Nov 26, 1950      Patient Gender: M Patient Age:   46 years Exam Location:  [facility] Procedure:      VAS [place] ABI WITH/WO TBI Referring Phys: JARED GARDNER --------------------------------------------------------------------------------  Indications: Ulceration left foot. High Risk Factors: Hypertension, hyperlipidemia, Diabetes, prior CVA.  Vascular Interventions: Lt great toe amputation. Comparison Study: no prior Performing Technologist: Archie Patten RVS  Examination Guidelines: A complete evaluation includes at minimum, Doppler waveform signals and systolic  blood pressure reading at the level of bilateral brachial, anterior tibial, and posterior tibial arteries, when vessel segments are accessible. Bilateral testing is considered an integral part of a complete examination. Photoelectric Plethysmograph (PPG) waveforms and toe systolic pressure readings are included as required and additional duplex testing as needed. Limited examinations for reoccurring indications may be performed as noted.  ABI Findings: +--------+------------------+-----+---------+--------+  Right    Rt Pressure (mmHg) Index Waveform  Comment   +--------+------------------+-----+---------+--------+  Brachial 177                       triphasic           +--------+------------------+-----+---------+--------+  PTA      182                1.03  triphasic           +--------+------------------+-----+---------+--------+  DP       152                0.86  triphasic           +--------+------------------+-----+---------+--------+ +---------+------------------+-----+---------+--------------------+  Left      Lt Pressure (mmHg) Index Waveform  Comment               +---------+------------------+-----+---------+--------------------+  Brachial  167                      triphasic                       +---------+------------------+-----+---------+--------------------+  PTA       179                1.01  biphasic                        +---------+------------------+-----+---------+--------------------+  DP        145                0.82  biphasic                        +---------+------------------+-----+---------+--------------------+  Great Toe                                    great toe amputation  +---------+------------------+-----+---------+--------------------+ +-------+-----------+-----------+------------+------------+  ABI/TBI Today's ABI Today's TBI Previous ABI Previous TBI  +-------+-----------+-----------+------------+------------+  Right   1.03                                               +-------+-----------+-----------+------------+------------+  Left    1.01                                               +-------+-----------+-----------+------------+------------+  Summary: Right: Resting right ankle-brachial index is within normal range. No evidence of significant right lower extremity arterial disease. Left: Resting left ankle-brachial index is within normal range. No evidence of significant left lower extremity arterial disease.  *See table(s) above for measurements and observations.  Electronically signed by Servando Snare MD on 10/10/2021 at 5:55:43 PM.    Final  Subjective: - no chest pain, shortness of breath, no  abdominal pain, nausea or vomiting.   Discharge Exam: BP (!) 162/75 (BP Location: Left Arm)    Pulse 80    Temp 98 F (36.7 C)    Resp 16    Ht 6' (1.829 m)    Wt 94.3 kg    SpO2 100%    BMI 28.21 kg/m   General: Pt is alert, awake, not in acute distress Cardiovascular: RRR, S1/S2 +, no rubs, no gallops Respiratory: CTA bilaterally, no wheezing, no rhonchi Abdominal: Soft, NT, ND, bowel sounds + Extremities: no edema, no cyanosis   The results of significant diagnostics from this hospitalization (including imaging, microbiology, ancillary and laboratory) are listed below for reference.     Microbiology: Recent Results (from the past 240 hour(s))  Aerobic/Anaerobic Culture w Gram Stain (surgical/deep wound)     Status: None   Collection Time: 10/10/21  9:49 PM   Specimen: Abscess; Wound  Result Value Ref Range Status   Specimen Description ABSCESS LEFT FOOT  Final   Special Requests NONE  Final   Gram Stain   Final    FEW WBC PRESENT, PREDOMINANTLY PMN FEW GRAM POSITIVE COCCI IN CHAINS    Culture   Final    FEW STREPTOCOCCUS ANGINOSIS FEW PROTEUS MIRABILIS FEW PREVOTELLA BIVIA BETA LACTAMASE POSITIVE Performed at [facility], [address], [place], [place] [address]    Report Status 10/16/2021 FINAL  Final   Organism ID, Bacteria STREPTOCOCCUS ANGINOSIS  Final   Organism ID, Bacteria PROTEUS MIRABILIS  Final      Susceptibility   Proteus mirabilis - MIC*    AMPICILLIN <=2 SENSITIVE Sensitive     CEFAZOLIN 8 SENSITIVE Sensitive     CEFEPIME <=0.12 SENSITIVE Sensitive     CEFTAZIDIME <=1 SENSITIVE Sensitive     CEFTRIAXONE <=0.25 SENSITIVE Sensitive     CIPROFLOXACIN <=0.25 SENSITIVE Sensitive     GENTAMICIN <=1 SENSITIVE Sensitive     IMIPENEM 2 SENSITIVE Sensitive     TRIMETH/SULFA <=20 SENSITIVE Sensitive     AMPICILLIN/SULBACTAM <=2 SENSITIVE Sensitive     PIP/TAZO <=4 SENSITIVE Sensitive     * FEW PROTEUS MIRABILIS   Streptococcus anginosis - MIC*     PENICILLIN <=0.06 SENSITIVE Sensitive     CEFTRIAXONE <=0.12 SENSITIVE Sensitive     ERYTHROMYCIN <=0.12 SENSITIVE Sensitive     LEVOFLOXACIN 1 SENSITIVE Sensitive     VANCOMYCIN 0.5 SENSITIVE Sensitive     * FEW STREPTOCOCCUS ANGINOSIS  MRSA Next Gen by PCR, Nasal     Status: None   Collection Time: 10/11/21  5:54 AM   Specimen: Nasal Mucosa; Nasal Swab  Result Value Ref Range Status   MRSA by PCR Next Gen NOT DETECTED NOT DETECTED Final    Comment: (NOTE) The GeneXpert MRSA Assay (FDA approved for NASAL specimens only), is one component of a comprehensive MRSA colonization surveillance program. It is not intended to diagnose MRSA infection nor to guide or monitor treatment for MRSA infections. Test performance is not FDA approved in patients less than 16 years old. Performed at [facility], [place] [address] [place], [place] [address]      Labs: Basic Metabolic Panel: Recent Labs  Lab 10/15/21 0141 10/16/21 0224 10/17/21 0110 10/18/21 0349 10/19/21 0146  NA 133* 131* 133* 133* 134*  K 4.7 5.2* 5.2* 4.5 4.7  CL 106 107 107 106 104  CO2 17* 18* 19* 19* 19*  GLUCOSE 228*  241* 202* 261* 196*  BUN 40* 42* 43* 44* 52*  CREATININE 4.26* 4.63* 4.39* 4.20* 4.12*  CALCIUM 7.6* 7.7* 7.7* 8.0* 8.1*   Liver Function Tests: Recent Labs  Lab 10/16/21 0224 10/17/21 0110 10/18/21 0349 10/19/21 0146  AST 140* 181* 94* 61*  ALT 138* 186* 144* 123*  ALKPHOS 205* 212* 177* 177*  BILITOT 0.2* 0.5 0.3 0.2*  PROT 7.1 7.7 7.3 7.0  ALBUMIN <1.5* 1.6* 1.5* 1.6*   CBC: Recent Labs  Lab 10/14/21 0613 10/15/21 0141 10/17/21 0110 10/18/21 0349 10/19/21 0146  WBC 18.1* 15.6* 13.8* 11.5* 11.4*  HGB 9.0* 8.0* 8.3* 8.2* 7.8*  HCT 28.2* 25.0* 26.9* 25.4* 24.2*  MCV 88.7 88.7 89.1 88.5 88.3  PLT 358 335 372 330 362   CBG: Recent Labs  Lab 10/19/21 1127 10/19/21 1611 10/19/21 1958 10/20/21 0648 10/20/21 0905  GLUCAP 212* 230* 241* 230* 314*   Hgb A1c No results for  input(s): HGBA1C in the last 72 hours. Lipid Profile No results for input(s): CHOL, HDL, LDLCALC, TRIG, CHOLHDL, LDLDIRECT in the last 72 hours. Thyroid function studies No results for input(s): TSH, T4TOTAL, T3FREE, THYROIDAB in the last 72 hours.  Invalid input(s): FREET3 Urinalysis    Component Value Date/Time   COLORURINE YELLOW 10/10/2021 2033   APPEARANCEUR CLEAR 10/10/2021 2033   LABSPEC 1.015 10/10/2021 2033   PHURINE 6.0 10/10/2021 2033   GLUCOSEU NEGATIVE 10/10/2021 2033   HGBUR MODERATE (A) 10/10/2021 2033   BILIRUBINUR NEGATIVE 10/10/2021 2033   [place] NEGATIVE 10/10/2021 2033   PROTEINUR 100 (A) 10/10/2021 2033   NITRITE NEGATIVE 10/10/2021 2033   LEUKOCYTESUR NEGATIVE 10/10/2021 2033    FURTHER DISCHARGE INSTRUCTIONS:   Get Medicines reviewed and adjusted: Please take all your medications with you for your next visit with your Primary MD   Laboratory/radiological data: Please request your Primary MD to go over all hospital tests and procedure/radiological results at the follow up, please ask your Primary MD to get all Hospital records sent to his/her office.   In some cases, they will be blood work, cultures and biopsy results pending at the time of your discharge. Please request that your primary care M.D. goes through all the records of your hospital data and follows up on these results.   Also Note the following: If you experience worsening of your admission symptoms, develop shortness of breath, life threatening emergency, suicidal or homicidal thoughts you must seek medical attention immediately by calling 911 or calling your MD immediately  if symptoms less severe.   You must read complete instructions/literature along with all the possible adverse reactions/side effects for all the Medicines you take and that have been prescribed to you. Take any new Medicines after you have completely understood and accpet all the possible adverse reactions/side effects.     Do not drive when taking Pain medications or sleeping medications (Benzodaizepines)   Do not take more than prescribed Pain, Sleep and Anxiety Medications. It is not advisable to combine anxiety,sleep and pain medications without talking with your primary care practitioner   Special Instructions: If you have smoked or chewed Tobacco  in the last 2 yrs please stop smoking, stop any regular Alcohol  and or any Recreational drug use.   Wear Seat belts while driving.   Please note: You were cared for by a hospitalist during your hospital stay. Once you are discharged, your primary care physician will handle any further medical issues. Please note that NO REFILLS for any discharge medications will be  authorized once you are discharged, as it is imperative that you return to your primary care physician (or establish a relationship with a primary care physician if you do not have one) for your post hospital discharge needs so that they can reassess your need for medications and monitor your lab values.  Time coordinating discharge: 40 minutes  SIGNED:  Marzetta Board, MD, PhD 10/20/2021, 10:35 AM

## 2021-10-20 NOTE — TOC Transition Note (Signed)
Transition of Care [facility]) - CM/SW Discharge Note   Patient Details  Name: [name] MRN: [id number] Date of Birth: January 25, 1951  [facility] CM/SW Contact:  Sharin Mons, RN Phone Number: 10/20/2021, 9:58 AM   Clinical Narrative:    Patient will DC to: home Anticipated DC date: 10/20/2021 Family notified: yes Transport by: car     S/p  L foot first metatarsal resection and I & D of the left heel  VAC drsg placed by [place] nurse, 10/20/2021.  Per MD patient ready for DC today . RN, patient, patient's wife, and [facility] notified of DC.   Post hospital f/u noted on AVS.  Pt without Rx med concerns .  Wife to provide transportation to home.  RNCM will sign off for now as intervention is no longer needed. Please consult [place] again if new needs arise.   Final next level of care: [place] Barriers to Discharge: No Barriers Identified   Patient Goals and CMS Choice     Choice offered to / list presented to : Patient, Spouse  Discharge Placement                       Discharge Plan and Services   Discharge Planning Services: CM Consult                      HH Arranged: RN [facility] Agency: [place] Date [place]: 10/14/21 Time [place]: [phone] Representative spoke with at [place]: Yoe ([place]) Interventions     Readmission Risk Interventions No flowsheet data found.

## 2021-10-20 NOTE — Plan of Care (Signed)

## 2021-10-20 NOTE — Consult Note (Signed)
[place] Nurse Consult Note: Patient receiving care in [facility] 5N03 To be discharged today with home vac. Paged by bedside nurse yesterday PM after the [place] team had already left for the day. Never consulted to start this vac.  Reason for Consult: home vac start to left heel Wound type: Left foot first metatarsal resection and I & D of the left heel.  Pressure Injury POA: NA Measurement: 4.5 cm x 4.5 cm x 3 cm Wound bed: Pink with yellow fatty tissue throughout Drainage (amount, consistency, odor) Scant serosanguinous Periwound: minor maceration around the wound Dressing procedure/placement/frequency: Removed ace wrap and Kerlix. Kerlix stuck to some of the staples but easily removed. Incision had minor bloody drainage but no dehiscence noted. Packing was removed from the heel. Traced the outer edges of the wound with a barrier ring to prevent leakage and protect surrounding skin. One piece of foam dressing placed in the heel. Drape applied. Bridged to the anterior aspect of the LE, drape applied and immediate suction obtained at 125 mmHg. Applied betadine over the metatarsal incision and covered with a non-adherent gauze. Wrapped the foot and LE with Kerlix and Ace wrap. Patient tolerated the procedure very well. HH to come out to change the vac dressing  Thank you for the consult. [place] nurse will not follow at this time.   Please re-consult the [place] team if needed.  Cathlean Marseilles Tamala [name], MSN, RN, [place], Lysle Pearl, [facility] Wound Treatment Associate Pager [phone]

## 2021-10-21 ENCOUNTER — Telehealth: Payer: Self-pay | Admitting: *Deleted

## 2021-10-21 ENCOUNTER — Other Ambulatory Visit: Payer: Self-pay | Admitting: Sports Medicine

## 2021-10-21 DIAGNOSIS — L97505 Non-pressure chronic ulcer of other part of unspecified foot with muscle involvement without evidence of necrosis: Secondary | ICD-10-CM

## 2021-10-21 DIAGNOSIS — E11621 Type 2 diabetes mellitus with foot ulcer: Secondary | ICD-10-CM

## 2021-10-21 NOTE — Telephone Encounter (Signed)
[name] (nurse) w/ [facility] is calling for wound vac (Mon. Wed., Fri. ) orders for home nursing. The  vac was placed at hospital 1 day ago. Please advise.

## 2021-10-24 NOTE — Telephone Encounter (Signed)
Faxed the ordered note to [facility], attention Diane-10/24/21,received confirmation.

## 2021-10-25 ENCOUNTER — Telehealth: Payer: Self-pay | Admitting: *Deleted

## 2021-10-25 NOTE — Telephone Encounter (Signed)
Ben w/ [facility]([phone]) is calling for clarification on weight bearing status for patient. He is also requesting orders for 2 weeks 2,1 week 3,recommended frequency. Please advise.

## 2021-10-26 ENCOUNTER — Encounter: Payer: Self-pay | Admitting: Internal Medicine

## 2021-10-26 ENCOUNTER — Telehealth: Payer: Self-pay | Admitting: Internal Medicine

## 2021-10-26 ENCOUNTER — Ambulatory Visit
Admission: EM | Admit: 2021-10-26 | Discharge: 2021-10-26 | Disposition: A | Discharge status: Home / Self Care | Payer: Medicare HMO | Attending: Family Medicine | Admitting: Family Medicine

## 2021-10-26 ENCOUNTER — Encounter: Payer: Self-pay | Admitting: Emergency Medicine

## 2021-10-26 ENCOUNTER — Other Ambulatory Visit: Payer: Self-pay

## 2021-10-26 ENCOUNTER — Ambulatory Visit: Payer: Medicare HMO | Admitting: Internal Medicine

## 2021-10-26 VITALS — BP 96/62 | HR 99 | Temp 97.6°F | Wt 194.0 lb

## 2021-10-26 DIAGNOSIS — M869 Osteomyelitis, unspecified: Secondary | ICD-10-CM | POA: Diagnosis not present

## 2021-10-26 DIAGNOSIS — E875 Hyperkalemia: Secondary | ICD-10-CM | POA: Diagnosis not present

## 2021-10-26 NOTE — ED Triage Notes (Addendum)
Pt reports was called and told by Infectious Disease office and told needed evaluation related to potassium of 6.2 and ekg completed. Pt denies chest pain but reports increased dyspnea with ambulation.  Pt just released from hospital a few days prior related to wound infection on LLE. Wound vac noted. Pt denies any fever, chills, n/v.

## 2021-10-26 NOTE — Progress Notes (Signed)
Patient Active Problem List   Diagnosis Date Noted   Malnutrition of moderate degree 10/18/2021   Sepsis ([place]) 10/10/2021   Gas gangrene of foot ([place]) 10/10/2021   Acute kidney injury superimposed on CKD ([place]) 10/10/2021   Anemia in chronic kidney disease 05/27/2020   Secondary hyperparathyroidism of renal origin ([place]) 02/25/2020   Diabetic foot ulcer ([place]) 10/07/2019   Benign hypertensive kidney disease with chronic kidney disease 06/17/2019   Proteinuria 06/17/2019   History of amputation of lesser toe of left foot ([place]) 08/18/2015   Hyperlipidemia, mixed 08/18/2015   Type 2 diabetes mellitus with peripheral neuropathy ([place]) 08/18/2015   Acute CVA (cerebrovascular accident) ([place]) 08/09/2015   Essential hypertriglyceridemia 08/09/2015   Hypertension, essential 08/09/2015   Right-sided muscle weakness 08/09/2015   T2DM (type 2 diabetes mellitus) ([place]) 08/09/2015   Tobacco abuse 08/09/2015   CKD (chronic kidney disease) stage 4, GFR 15-29 ml/min ([place]) 04/17/2013   DM2 (diabetes mellitus, type 2) ([place]) 04/17/2013   HTN (hypertension) 04/17/2013   Osteomyelitis of toe of left foot ([place]) 04/17/2013    Patient's Medications  New Prescriptions   No medications on file  Previous Medications   AMLODIPINE (NORVASC) 10 MG TABLET    Take 10 mg by mouth daily.    AMOXICILLIN-CLAVULANATE (AUGMENTIN) 500-125 MG TABLET    Take 1 tablet (500 mg total) by mouth every 12 (twelve) hours.   ASPIRIN EC 81 MG TABLET    Take 81 mg by mouth daily. Swallow whole.   ATORVASTATIN (LIPITOR) 80 MG TABLET    Take 80 mg by mouth daily.    BLOOD GLUCOSE MONITORING SUPPL (TRUE METRIX METER) W/DEVICE KIT       DOCUSATE SODIUM (COLACE) 100 MG CAPSULE    Take 200 mg by mouth daily.   DOXYCYCLINE (VIBRA-TABS) 100 MG TABLET    Take 1 tablet (100 mg total) by mouth every 12 (twelve) hours.   DULAGLUTIDE 4.5 MG/0.5ML SOPN    Inject 4.5 mg into the skin every Wednesday.   GLUCOSE BLOOD TEST STRIP     daily.   HYDROCODONE-ACETAMINOPHEN (NORCO/VICODIN) 5-325 MG TABLET    Take 1 tablet by mouth every 6 (six) hours as needed for moderate pain.   LANCETS (ONETOUCH ULTRASOFT) LANCETS    daily.   NOVOLOG MIX 70/30 FLEXPEN (70-30) 100 UNIT/ML FLEXPEN    Inject 32 Units into the skin 2 (two) times daily with a meal.   PREGABALIN (LYRICA) 75 MG CAPSULE    Take 75 mg by mouth 2 (two) times daily.   TORSEMIDE (DEMADEX) 20 MG TABLET    Take 20 mg by mouth daily.   ULTICARE MINI PEN NEEDLES 31G X 6 MM MISC       UNABLE TO FIND    daily.  Modified Medications   No medications on file  Discontinued Medications   No medications on file    Subjective: 32 YM with PMHx as below(including A1c 8.3), CKD stage IV presents for hospital follow-up of left foot osteomyelitis. He was admitted 1/9-1/19/23 to [facility] with worsening diabetic foot wound. He started on bactrim prior to admission. Taken to OR on 1/9 for I7D and 1st metatarsal resection on 1/11 OR Cx + strep anginosis, proteus mirabilis and Prevotella bivia. Started on linezolid(avoid nephrotoxicity of vancomycin) and pip-tazo. He continued to fever. ID engaged. MRI left ankle showed abscess in hindfoot, calcaneal acute osteomyelitis, abscess in the proximal abductor hallucis. He underwent  debridement of  left heel with bone Bx on 1/15. HE was transitioned to Augmentin and doxycyline to complete 6 weeks of antibiotics from OR 1/15 (EOT 11/26/21). Today: He reports slight tremors that started a couple weeks priro to admission. HE denies fever, chills, N/V/D. Review of Systems: Review of Systems  All other systems reviewed and are negative.  Past Medical History:  Diagnosis Date   Chronic kidney disease    stage 4-Cr 3.3 11-19-19   Diabetes mellitus without complication ([place])    Diabetic ulcer of foot associated with diabetes mellitus due to underlying condition, limited to breakdown of skin ([place])    left   GERD (gastroesophageal reflux disease)     Hyperlipidemia    Hypertension    Non compliance w medication regimen    Stroke ([place]) 2016   no deficits    Social History   Tobacco Use   Smoking status: Former   Smokeless tobacco: Never  Substance Use Topics   Alcohol use: Not Currently   Drug use: Never    No family history on file.  No Known Allergies  Health Maintenance  Topic Date Due   FOOT EXAM  Never done   OPHTHALMOLOGY EXAM  Never done   URINE MICROALBUMIN  Never done   Zoster Vaccines- Shingrix (1 of 2) Never done   Pneumonia Vaccine 58+ Years old (2 - PCV) 08/17/2016   COVID-19 Vaccine (4 - Booster for Moderna series) 11/09/2020   HEMOGLOBIN A1C  04/09/2022   COLONOSCOPY (Pts 45-37yr Insurance coverage will need to be confirmed)  08/10/2025   TETANUS/TDAP  08/17/2025   INFLUENZA VACCINE  Completed   Hepatitis C Screening  Completed   HPV VACCINES  Aged Out    Objective:  There were no vitals filed for this visit. There is no height or weight on file to calculate BMI.  Physical Exam Constitutional:      General: He is not in acute distress.    Appearance: He is normal weight. He is not toxic-appearing.  HENT:     Head: Normocephalic and atraumatic.     Right Ear: External ear normal.     Left Ear: External ear normal.     Nose: No congestion or rhinorrhea.     Mouth/Throat:     Mouth: Mucous membranes are moist.     Pharynx: Oropharynx is clear.  Eyes:     Extraocular Movements: Extraocular movements intact.     Conjunctiva/sclera: Conjunctivae normal.     Pupils: Pupils are equal, round, and reactive to light.  Cardiovascular:     Rate and Rhythm: Normal rate and regular rhythm.     Heart sounds: No murmur heard.   No friction rub. No gallop.  Pulmonary:     Effort: Pulmonary effort is normal.     Breath sounds: Normal breath sounds.  Abdominal:     General: Abdomen is flat. Bowel sounds are normal.     Palpations: Abdomen is soft.  Musculoskeletal:        General: No swelling.  Normal range of motion.     Cervical back: Normal range of motion and neck supple.     Comments: LLE wound vac.   Skin:    General: Skin is warm and dry.  Neurological:     General: No focal deficit present.     Mental Status: He is oriented to person, place, and time.  Psychiatric:        Mood and Affect: Mood normal.    Lab Results  Lab Results  Component Value Date   WBC 11.4 (H) 10/19/2021   HGB 7.8 (L) 10/19/2021   HCT 24.2 (L) 10/19/2021   MCV 88.3 10/19/2021   PLT 362 10/19/2021    Lab Results  Component Value Date   CREATININE 4.12 (H) 10/19/2021   BUN 52 (H) 10/19/2021   NA 134 (L) 10/19/2021   K 4.7 10/19/2021   CL 104 10/19/2021   CO2 19 (L) 10/19/2021    Lab Results  Component Value Date   ALT 123 (H) 10/19/2021   AST 61 (H) 10/19/2021   ALKPHOS 177 (H) 10/19/2021   BILITOT 0.2 (L) 10/19/2021    No results found for: CHOL, HDL, LDLCALC, LDLDIRECT, TRIG, CHOLHDL No results found for: LABRPR, RPRTITER No results found for: HIV1RNAQUANT, HIV1RNAVL, CD4TABS   A/P #Left foot osteomyelitis SP I&D on 1/9 and 1st metatarsal resection on 1/11 #Calcaneal osteomyelitis and heel abscess SP I&D on 1/15 -OR Cx+ form 1/9+ strep anginosis, proteus mirabilis and Prevotella bivia -Bone Bx pathology on 1/15 showed osteomyelitis -Wound vac in place Plan:  -Augmentin and doxycyline to complete 6 weeks of antibiotics from OR 1/15 (EOT 11/26/21). I discussed with the pt that antibiotics alone are unlikely to be curative for calcaneal osteomyelitis. He would like to continue wound care/podiatry follow-up and complete PO antibiotics prior to considering amputation.  -Labs today cbc, cmp, esr crp -Pt mentioned he noticed hand tremors two weeks prior to admission, counseled to follow-up with PCP -Follow-up in 1 month  Laurice Record, MD [place] [facility] [place] [facility] 10/26/2021, 10:55 AM

## 2021-10-26 NOTE — Telephone Encounter (Signed)
Potassium 6.2. Called pt and counseled to go to ED for EKG/further management.

## 2021-10-27 ENCOUNTER — Other Ambulatory Visit: Payer: Self-pay | Admitting: Internal Medicine

## 2021-10-27 ENCOUNTER — Encounter (HOSPITAL_COMMUNITY): Payer: Self-pay | Admitting: *Deleted

## 2021-10-27 ENCOUNTER — Observation Stay (HOSPITAL_COMMUNITY)
Admission: EM | Admit: 2021-10-27 | Discharge: 2021-10-28 | Disposition: A | Discharge status: Home / Self Care | Payer: Medicare HMO | Attending: Internal Medicine | Admitting: Internal Medicine

## 2021-10-27 DIAGNOSIS — E782 Mixed hyperlipidemia: Secondary | ICD-10-CM | POA: Diagnosis not present

## 2021-10-27 DIAGNOSIS — E8809 Other disorders of plasma-protein metabolism, not elsewhere classified: Secondary | ICD-10-CM | POA: Diagnosis not present

## 2021-10-27 DIAGNOSIS — E1122 Type 2 diabetes mellitus with diabetic chronic kidney disease: Secondary | ICD-10-CM | POA: Insufficient documentation

## 2021-10-27 DIAGNOSIS — E875 Hyperkalemia: Secondary | ICD-10-CM

## 2021-10-27 DIAGNOSIS — N189 Chronic kidney disease, unspecified: Secondary | ICD-10-CM

## 2021-10-27 DIAGNOSIS — Z7982 Long term (current) use of aspirin: Secondary | ICD-10-CM | POA: Diagnosis not present

## 2021-10-27 DIAGNOSIS — N179 Acute kidney failure, unspecified: Secondary | ICD-10-CM | POA: Diagnosis not present

## 2021-10-27 DIAGNOSIS — M86172 Other acute osteomyelitis, left ankle and foot: Secondary | ICD-10-CM

## 2021-10-27 DIAGNOSIS — Z20822 Contact with and (suspected) exposure to covid-19: Secondary | ICD-10-CM | POA: Insufficient documentation

## 2021-10-27 DIAGNOSIS — N184 Chronic kidney disease, stage 4 (severe): Secondary | ICD-10-CM | POA: Diagnosis not present

## 2021-10-27 DIAGNOSIS — Z8673 Personal history of transient ischemic attack (TIA), and cerebral infarction without residual deficits: Secondary | ICD-10-CM | POA: Diagnosis not present

## 2021-10-27 DIAGNOSIS — E0842 Diabetes mellitus due to underlying condition with diabetic polyneuropathy: Secondary | ICD-10-CM

## 2021-10-27 DIAGNOSIS — Z79899 Other long term (current) drug therapy: Secondary | ICD-10-CM | POA: Diagnosis not present

## 2021-10-27 DIAGNOSIS — E46 Unspecified protein-calorie malnutrition: Secondary | ICD-10-CM

## 2021-10-27 DIAGNOSIS — Z87891 Personal history of nicotine dependence: Secondary | ICD-10-CM | POA: Insufficient documentation

## 2021-10-27 DIAGNOSIS — I10 Essential (primary) hypertension: Secondary | ICD-10-CM | POA: Diagnosis not present

## 2021-10-27 DIAGNOSIS — E860 Dehydration: Secondary | ICD-10-CM

## 2021-10-27 DIAGNOSIS — E1140 Type 2 diabetes mellitus with diabetic neuropathy, unspecified: Secondary | ICD-10-CM

## 2021-10-27 DIAGNOSIS — R748 Abnormal levels of other serum enzymes: Secondary | ICD-10-CM | POA: Diagnosis not present

## 2021-10-27 DIAGNOSIS — I129 Hypertensive chronic kidney disease with stage 1 through stage 4 chronic kidney disease, or unspecified chronic kidney disease: Secondary | ICD-10-CM | POA: Diagnosis not present

## 2021-10-27 DIAGNOSIS — E1142 Type 2 diabetes mellitus with diabetic polyneuropathy: Secondary | ICD-10-CM | POA: Diagnosis present

## 2021-10-27 LAB — CBC
HCT: 33.3 % — ABNORMAL LOW (ref 39.0–52.0)
Hemoglobin: 10.1 g/dL — ABNORMAL LOW (ref 13.0–17.0)
MCH: 28.2 pg (ref 26.0–34.0)
MCHC: 30.3 g/dL (ref 30.0–36.0)
MCV: 93 fL (ref 80.0–100.0)
Platelets: 401 10*3/uL — ABNORMAL HIGH (ref 150–400)
RBC: 3.58 MIL/uL — ABNORMAL LOW (ref 4.22–5.81)
RDW: 16.7 % — ABNORMAL HIGH (ref 11.5–15.5)
WBC: 9.2 10*3/uL (ref 4.0–10.5)
nRBC: 0 % (ref 0.0–0.2)

## 2021-10-27 LAB — CBC WITH DIFFERENTIAL/PLATELET
Absolute Monocytes: 717 cells/uL (ref 200–950)
Basophils Absolute: 75 cells/uL (ref 0–200)
Basophils Relative: 0.7 %
Eosinophils Absolute: 75 cells/uL (ref 15–500)
Eosinophils Relative: 0.7 %
HCT: 32 % — ABNORMAL LOW (ref 38.5–50.0)
Hemoglobin: 10.1 g/dL — ABNORMAL LOW (ref 13.2–17.1)
Lymphs Abs: 1808 cells/uL (ref 850–3900)
MCH: 27.9 pg (ref 27.0–33.0)
MCHC: 31.6 g/dL — ABNORMAL LOW (ref 32.0–36.0)
MCV: 88.4 fL (ref 80.0–100.0)
MPV: 9.7 fL (ref 7.5–12.5)
Monocytes Relative: 6.7 %
Neutro Abs: 8025 cells/uL — ABNORMAL HIGH (ref 1500–7800)
Neutrophils Relative %: 75 %
Platelets: 427 10*3/uL — ABNORMAL HIGH (ref 140–400)
RBC: 3.62 10*6/uL — ABNORMAL LOW (ref 4.20–5.80)
RDW: 14.9 % (ref 11.0–15.0)
Total Lymphocyte: 16.9 %
WBC: 10.7 10*3/uL (ref 3.8–10.8)

## 2021-10-27 LAB — COMPLETE METABOLIC PANEL WITH GFR
AG Ratio: 0.6 (calc) — ABNORMAL LOW (ref 1.0–2.5)
ALT: 37 U/L (ref 9–46)
AST: 20 U/L (ref 10–35)
Albumin: 3.1 g/dL — ABNORMAL LOW (ref 3.6–5.1)
Alkaline phosphatase (APISO): 151 U/L — ABNORMAL HIGH (ref 35–144)
BUN/Creatinine Ratio: 18 (calc) (ref 6–22)
BUN: 89 mg/dL — ABNORMAL HIGH (ref 7–25)
CO2: 20 mmol/L (ref 20–32)
Calcium: 9 mg/dL (ref 8.6–10.3)
Chloride: 103 mmol/L (ref 98–110)
Creat: 4.89 mg/dL — ABNORMAL HIGH (ref 0.70–1.35)
Globulin: 5.5 g/dL (calc) — ABNORMAL HIGH (ref 1.9–3.7)
Glucose, Bld: 186 mg/dL — ABNORMAL HIGH (ref 65–99)
Potassium: 6.2 mmol/L (ref 3.5–5.3)
Sodium: 134 mmol/L — ABNORMAL LOW (ref 135–146)
Total Bilirubin: 0.3 mg/dL (ref 0.2–1.2)
Total Protein: 8.6 g/dL — ABNORMAL HIGH (ref 6.1–8.1)
eGFR: 12 mL/min/{1.73_m2} — ABNORMAL LOW (ref >=60)

## 2021-10-27 LAB — COMPREHENSIVE METABOLIC PANEL
ALT: 37 U/L (ref 0–44)
AST: 21 U/L (ref 15–41)
Albumin: 2.9 g/dL — ABNORMAL LOW (ref 3.5–5.0)
Alkaline Phosphatase: 149 U/L — ABNORMAL HIGH (ref 38–126)
Anion gap: 8 (ref 5–15)
BUN: 95 mg/dL — ABNORMAL HIGH (ref 8–23)
CO2: 21 mmol/L — ABNORMAL LOW (ref 22–32)
Calcium: 8.9 mg/dL (ref 8.9–10.3)
Chloride: 107 mmol/L (ref 98–111)
Creatinine, Ser: 5.08 mg/dL — ABNORMAL HIGH (ref 0.61–1.24)
GFR, Estimated: 12 mL/min — ABNORMAL LOW (ref >60)
Glucose, Bld: 85 mg/dL (ref 70–99)
Potassium: 5.4 mmol/L — ABNORMAL HIGH (ref 3.5–5.1)
Sodium: 136 mmol/L (ref 135–145)
Total Bilirubin: 0.1 mg/dL — ABNORMAL LOW (ref 0.3–1.2)
Total Protein: 9.1 g/dL — ABNORMAL HIGH (ref 6.5–8.1)

## 2021-10-27 LAB — C-REACTIVE PROTEIN: CRP: 5.5 mg/L (ref <8.0)

## 2021-10-27 LAB — RESP PANEL BY RT-PCR (FLU A&B, COVID) ARPGX2
Influenza A by PCR: NEGATIVE
Influenza B by PCR: NEGATIVE
SARS Coronavirus 2 by RT PCR: NEGATIVE

## 2021-10-27 LAB — SEDIMENTATION RATE: Sed Rate: >130 mm/h — ABNORMAL HIGH (ref 0–20)

## 2021-10-27 MED ORDER — SODIUM ZIRCONIUM CYCLOSILICATE 5 G PO PACK
5.0000 g | PACK | Freq: Once | ORAL | Status: AC
  Administered 2021-10-27: 5 g via ORAL
  Filled 2021-10-27: qty 1

## 2021-10-27 NOTE — H&P (Signed)
History and Physical  [name] UXN:[id number] DOB: 10-14-1950 DOA: 10/27/2021  Referring physician: Karie Kirks PCP: [facility]  Patient coming from: Home  Chief Complaint: Elevated potassium level  HPI: [name] is a 71 y.o. male with medical history significant for CKD stage IV, T2DM, hypertension, hyperlipidemia who presents to the emergency department due to elevated potassium level.  Patient had left foot osteomyelitis status post infection and drainage on January 9 and fourth metatarsal resection on January 11, he also tachycardia osteomyelitis and heel abscess status post I&D on 1/15 Patient had a follow-up with his infectious disease specialist due to left foot osteomyelitis 2 days ago, blood work was done and resulted today, it was noted that he had hyperkalemia with potassium of 6.2, patient was asked to go to the ED for further evaluation and management. He denies any chest pain, shortness of breath, fever, chills, headache, nausea, vomiting, abdominal pain, constipation or diarrhea  ED Course:  In the emergency department, BP was 154/72 but other vital signs were within normal range.  Work-up in the ED showed normocytic anemia, potassium 5.4, BUN/creatinine 95/5.08 (baseline creatinine 4.1-4.2), albumin 2.9, ALP 149.  Influenza A, B, SARS coronavirus 2 was negative.  Review of Systems: A full 10 point Review of Systems was done, except as stated above, all other Review of systems were negative.  Past Medical History:  Diagnosis Date   Chronic kidney disease    stage 4-Cr 3.3 11-19-19   Diabetes mellitus without complication ([place])    Diabetic ulcer of foot associated with diabetes mellitus due to underlying condition, limited to breakdown of skin ([place])    left   GERD (gastroesophageal reflux disease)    Hyperlipidemia    Hypertension    Non compliance w medication regimen    Stroke ([place]) 2016   no deficits   Past Surgical  History:  Procedure Laterality Date   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   left big toe, secont toe   APPENDECTOMY     BIOPSY  08/10/2020   Procedure: BIOPSY;  Surgeon: Harvel Quale, MD;  [facility];  Service: Gastroenterology;;   CHOLECYSTECTOMY     COLON SURGERY     COLONOSCOPY WITH PROPOFOL N/A 08/10/2020   Procedure: COLONOSCOPY WITH PROPOFOL;  Surgeon: Harvel Quale, MD;  [facility];  Service: Gastroenterology;  Laterality: N/A;  706   GRAFT APPLICATION Left 11/04/7626   Procedure: GRAFT APPLICATION;  Surgeon: [name], DPM;  Location: [place];  Service: Podiatry;  Laterality: Left;   HERNIA REPAIR     I & D EXTREMITY Left 10/10/2021   Procedure: IRRIGATION AND DEBRIDEMENT left foot;  Surgeon: Evelina Bucy, DPM;  Location: [place];  Service: Podiatry;  Laterality: Left;   IRRIGATION AND DEBRIDEMENT FOOT Left 10/16/2021   Procedure: IRRIGATION AND DEBRIDEMENT FOOT;  Surgeon: [name], DPM;  Location: [place];  Service: Podiatry;  Laterality: Left;  Abscess left heel   METATARSAL HEAD EXCISION Left 12/08/2019   Procedure: METATARSAL HEAD RECESSION THIRD LEFT;  Surgeon: [name], DPM;  Location: [place];  Service: Podiatry;  Laterality: Left;   POLYPECTOMY  08/10/2020   Procedure: POLYPECTOMY;  Surgeon: Harvel Quale, MD;  [facility];  Service: Gastroenterology;;   WOUND DEBRIDEMENT Left 12/08/2019   Procedure: DEBRIDEMENT WOUND;  Surgeon: [name], DPM;  Location: [place];  Service: Podiatry;  Laterality: Left;  WOUND DEBRIDEMENT Left 10/12/2021   Procedure: LEFT FOOT WOUND DEBRIDEMENT AND IRRIGATION, 1ST METATARSAL RESECTION;  Surgeon: Evelina Bucy, DPM;  Location: [place];  Service: Podiatry;  Laterality: Left;    Social History:  reports that he has quit smoking. He has never used smokeless tobacco. He reports that he does not  currently use alcohol. He reports that he does not use drugs.   No Known Allergies  Family history: Patient was unsure of medical family history   Prior to Admission medications   Medication Sig Start Date End Date Taking? Authorizing Provider  amLODipine (NORVASC) 10 MG tablet Take 10 mg by mouth daily.  10/06/16   [provider]  amoxicillin-clavulanate (AUGMENTIN) 500-125 MG tablet Take 1 tablet (500 mg total) by mouth every 12 (twelve) hours. 10/19/21 11/28/21  Caren Griffins, MD  aspirin EC 81 MG tablet Take 81 mg by mouth daily. Swallow whole.    [provider]  atorvastatin (LIPITOR) 80 MG tablet Take 80 mg by mouth daily.  09/16/15   [provider]  Blood Glucose Monitoring Suppl (TRUE METRIX METER) w/Device KIT  07/12/18   [provider]  docusate sodium (COLACE) 100 MG capsule Take 200 mg by mouth daily.    [provider]  doxycycline (VIBRA-TABS) 100 MG tablet Take 1 tablet (100 mg total) by mouth every 12 (twelve) hours. 10/19/21 11/28/21  Caren Griffins, MD  Dulaglutide 4.5 MG/0.5ML SOPN Inject 4.5 mg into the skin every Wednesday.    [provider]  glucose blood test strip daily. 01/27/16   [provider]  HYDROcodone-acetaminophen (NORCO/VICODIN) 5-325 MG tablet Take 1 tablet by mouth every 6 (six) hours as needed for moderate pain. 10/19/21   Caren Griffins, MD  Lancets [facility] ULTRASOFT) lancets daily. 02/02/16   [provider]  NOVOLOG MIX 70/30 FLEXPEN (70-30) 100 UNIT/ML FlexPen Inject 32 Units into the skin 2 (two) times daily with a meal. 12/17/19   [provider]  pregabalin (LYRICA) 75 MG capsule Take 75 mg by mouth 2 (two) times daily. 10/15/20   [provider]  torsemide (DEMADEX) 20 MG tablet Take 20 mg by mouth daily. 08/04/20   [provider]  ULTICARE MINI PEN NEEDLES 31G X 6 MM [place]  08/23/18   [provider]  UNABLE TO FIND daily. 08/18/15    [provider]    Physical Exam: BP (!) 161/73 (BP Location: Right Arm)    Pulse 80    Temp 97.7 F (36.5 C) (Oral)    Resp 18    Wt 87.2 kg    SpO2 100%    BMI 26.07 kg/m   General: 71 y.o. year-old male well developed well nourished in no acute distress.  Alert and oriented x3. HEENT: Dry mucous membrane.  NCAT, EOMI Neck: Supple, trachea medial Cardiovascular: Regular rate and rhythm with no rubs or gallops.  No thyromegaly or JVD noted.  No lower extremity edema. 2/4 pulses in all 4 extremities. Respiratory: Clear to auscultation with no wheezes or rales. Good inspiratory effort. Abdomen: Soft, nontender nondistended with normal bowel sounds x4 quadrants. Muskuloskeletal: LLE in an Ortho boot.  No cyanosis, clubbing or edema noted bilaterally Neuro: CN II-XII intact, strength 5/5 x 4, sensation, reflexes intact Skin: No ulcerative lesions noted or rashes Psychiatry: Judgement and insight appear normal. Mood is appropriate for condition and setting          Labs on Admission:  Basic Metabolic Panel: Recent  Labs  Lab 10/26/21 1122 10/27/21 1739  NA 134* 136  K 6.2* 5.4*  CL 103 107  CO2 20 21*  GLUCOSE 186* 85  BUN 89* 95*  CREATININE 4.89* 5.08*  CALCIUM 9.0 8.9   Liver Function Tests: Recent Labs  Lab 10/26/21 1122 10/27/21 1739  AST 20 21  ALT 37 37  ALKPHOS  --  149*  BILITOT 0.3 0.1*  PROT 8.6* 9.1*  ALBUMIN  --  2.9*   No results for input(s): LIPASE, AMYLASE in the last 168 hours. No results for input(s): AMMONIA in the last 168 hours. CBC: Recent Labs  Lab 10/26/21 1122 10/27/21 1739  WBC 10.7 9.2  NEUTROABS 8,025*  --   HGB 10.1* 10.1*  HCT 32.0* 33.3*  MCV 88.4 93.0  PLT 427* 401*   Cardiac Enzymes: No results for input(s): CKTOTAL, CKMB, CKMBINDEX, TROPONINI in the last 168 hours.  BNP (last 3 results) No results for input(s): BNP in the last 8760 hours.  ProBNP (last 3 results) No results for input(s): PROBNP in the last  8760 hours.  CBG: No results for input(s): GLUCAP in the last 168 hours.  Radiological Exams on Admission: No results found.  EKG: I independently viewed the EKG done and my findings are as followed: Normal sinus rhythm at a rate of 85 bpm  Assessment/Plan Present on Admission:  Acute kidney injury superimposed on CKD (HCC)  Hypertension, essential  Hyperlipidemia, mixed  Type 2 diabetes mellitus with peripheral neuropathy ([place])  Active Problems:   Hyperlipidemia, mixed   Hypertension, essential   Type 2 diabetes mellitus with peripheral neuropathy (HCC)   Acute kidney injury superimposed on CKD (HCC)   Hyperkalemia   Hypoalbuminemia due to protein-calorie malnutrition (HCC)   Dehydration   Elevated alkaline phosphatase level   Acute osteomyelitis of left foot (HCC)   Diabetic neuropathy (HCC)   Acute kidney injury on CKD 4 BUN/creatinine 95/5.08 (baseline creatinine 4.1-4.2) Continue IV hydration Renally adjust medications, avoid nephrotoxic agents/dehydration/hypotension  Hyperkalemia K+ 6.2> 5.4: Lokelma was given Continue IV hydration Check potassium in the morning and treat accordingly  Left foot osteomyelitis s/p incision and drainage (most recent on 1/15) Continue Augmentin and doxycycline  Hypoalbuminemia secondary to moderate protein calorie malnutrition Albumin 2.9, protein supplement will be provided  T2DM Continue ISS and hypoglycemic protocol  Elevated ALP ALP 149, patient denies any abdominal pain Continue to monitor liver enzymes  Essential hypertension (uncontrolled) Continue amlodipine  Hyperlipidemia Patient was no longer on statin per med rec  Diabetic neuropathy Continue pregabalin   DVT prophylaxis: Lovenox  Code Status: Full code  Family Communication: None at bedside  Disposition Plan:  Patient is from:                        home Anticipated DC to:                   SNF or family members home Anticipated DC date:                2-3 days Anticipated DC barriers:          Patient requires inpatient management due to hyperkalemia and acute kidney injury requiring treatment  Consults called: None  Admission status: Observation    Bernadette Hoit MD [facility]  10/28/2021, 3:01 AM

## 2021-10-27 NOTE — ED Notes (Signed)
Face sheet and med necessity not supposed to be done on this pt. This RN meant to do it for another pt instead. Disregard.

## 2021-10-27 NOTE — Telephone Encounter (Signed)
Received call from patient's wife with reports of  normal EKG reading. ED has advised patient to follow up with ID to recheck Potasium level. Routing to provider to review EKG results and advise.  [name]

## 2021-10-27 NOTE — Telephone Encounter (Signed)
Left HIPPA compliant voicemail with patient's wife in regards to EKG reports. Pre Dr. Wallace Keller patient is to have repeat CMP to check potassium level and follow up with Nephrology. Returned a call return. [name]

## 2021-10-27 NOTE — ED Triage Notes (Signed)
Elevated potassium

## 2021-10-27 NOTE — Progress Notes (Signed)
labs

## 2021-10-27 NOTE — ED Provider Notes (Signed)
[facility] EMERGENCY DEPARTMENT Provider Note   CSN: [id number] Arrival date & time: 10/27/21  1634     History  No chief complaint on file.   [name] is a 71 y.o. male with a history of CKD stage IV, diabetes, hyperlipidemia, and hypertension who presents to the ED due to hyperkalemia. Patient had labs drawn at his ID doctor yesterday and was found to have a potassium level of 6.2. patient was called today and advised to report to the ED for further evaluation.  Patient recently admitted to the hospital 1/9-1/19 due to sepsis secondary to left foot osteomyelitis.  Patient also found to have an AKI at the time of admission.  Denies nausea, vomiting, diarrhea.  No chest pain or shortness of breath.     Home Medications Prior to Admission medications   Medication Sig Start Date End Date Taking? Authorizing Provider  amLODipine (NORVASC) 10 MG tablet Take 10 mg by mouth daily.  10/06/16   [provider]  amoxicillin-clavulanate (AUGMENTIN) 500-125 MG tablet Take 1 tablet (500 mg total) by mouth every 12 (twelve) hours. 10/19/21 11/28/21  Caren Griffins, MD  aspirin EC 81 MG tablet Take 81 mg by mouth daily. Swallow whole.    [provider]  atorvastatin (LIPITOR) 80 MG tablet Take 80 mg by mouth daily.  09/16/15   [provider]  Blood Glucose Monitoring Suppl (TRUE METRIX METER) w/Device KIT  07/12/18   [provider]  docusate sodium (COLACE) 100 MG capsule Take 200 mg by mouth daily.    [provider]  doxycycline (VIBRA-TABS) 100 MG tablet Take 1 tablet (100 mg total) by mouth every 12 (twelve) hours. 10/19/21 11/28/21  Caren Griffins, MD  Dulaglutide 4.5 MG/0.5ML SOPN Inject 4.5 mg into the skin every Wednesday.    [provider]  glucose blood test strip daily. 01/27/16   [provider]  HYDROcodone-acetaminophen (NORCO/VICODIN) 5-325 MG tablet Take 1 tablet by mouth every 6 (six) hours as needed for moderate  pain. 10/19/21   Caren Griffins, MD  Lancets [facility] ULTRASOFT) lancets daily. 02/02/16   [provider]  NOVOLOG MIX 70/30 FLEXPEN (70-30) 100 UNIT/ML FlexPen Inject 32 Units into the skin 2 (two) times daily with a meal. 12/17/19   [provider]  pregabalin (LYRICA) 75 MG capsule Take 75 mg by mouth 2 (two) times daily. 10/15/20   [provider]  torsemide (DEMADEX) 20 MG tablet Take 20 mg by mouth daily. 08/04/20   [provider]  ULTICARE MINI PEN NEEDLES 31G X 6 MM [place]  08/23/18   [provider]  UNABLE TO FIND daily. 08/18/15   [provider]      Allergies    Patient has no known allergies.    Review of Systems   Review of Systems  Cardiovascular:  Negative for chest pain.  Gastrointestinal:  Negative for abdominal pain, diarrhea, nausea and vomiting.  All other systems reviewed and are negative.  Physical Exam Updated Vital Signs BP (!) 149/86    Pulse 83    Temp 97.8 F (36.6 C) (Oral)    Resp 18    SpO2 99%  Physical Exam Vitals and nursing note reviewed.  Constitutional:      General: He is not in acute distress.    Appearance: He is not ill-appearing.  HENT:     Head: Normocephalic.  Eyes:     Pupils: Pupils are equal, round, and reactive to light.  Cardiovascular:     Rate and Rhythm: Normal rate and regular rhythm.     Pulses: Normal pulses.     Heart sounds: Normal heart sounds. No murmur heard.   No friction rub. No gallop.  Pulmonary:     Effort: Pulmonary effort is normal.     Breath sounds: Normal breath sounds.  Abdominal:     General: Abdomen is flat. There is no distension.     Palpations: Abdomen is soft.     Tenderness: There is no abdominal tenderness. There is no guarding or rebound.  Musculoskeletal:        General: Normal range of motion.     Cervical back: Neck supple.  Skin:    General: Skin is warm and dry.  Neurological:     General: No focal deficit present.     Mental  Status: He is alert.  Psychiatric:        Mood and Affect: Mood normal.        Behavior: Behavior normal.    ED Results / Procedures / Treatments   Labs (all labs ordered are listed, but only abnormal results are displayed) Labs Reviewed  COMPREHENSIVE METABOLIC PANEL - Abnormal; Notable for the following components:      Result Value   Potassium 5.4 (*)    CO2 21 (*)    BUN 95 (*)    Creatinine, Ser 5.08 (*)    Total Protein 9.1 (*)    Albumin 2.9 (*)    Alkaline Phosphatase 149 (*)    Total Bilirubin 0.1 (*)    GFR, Estimated 12 (*)    All other components within normal limits  CBC - Abnormal; Notable for the following components:   RBC 3.58 (*)    Hemoglobin 10.1 (*)    HCT 33.3 (*)    RDW 16.7 (*)    Platelets 401 (*)    All other components within normal limits  RESP PANEL BY RT-PCR (FLU A&B, COVID) [id number]    EKG None  Radiology No results found.  Procedures Procedures    Medications Ordered in ED Medications  sodium zirconium cyclosilicate (LOKELMA) packet 5 g (5 g Oral Given 10/27/21 1956)    ED Course/ Medical Decision Making/ A&P Clinical Course as of 10/27/21 2008  Thu Oct 27, 2021  1916 Potassium(!): 5.4 [CA]  1916 Creatinine(!): 5.08 [CA]  1916 BUN(!): 95 [CA]  1916 Alkaline Phosphatase(!): 149 [CA]  1916 Platelets(!): 401 [CA]    Clinical Course User Index [CA] Suzy Bouchard, PA-C                           Medical Decision Making Amount and/or Complexity of Data Reviewed Independent Historian: spouse    Details: wife at bedside provided history External Data Reviewed: notes.    Details: recent admission. recent infectious disease visit Labs: ordered. Decision-making details documented in ED Course. ECG/medicine tests: ordered and independent interpretation performed. Decision-making details documented in ED Course.  Risk Prescription drug management. Decision regarding hospitalization.   72 year old male presents to the ED  due to hyperkalemia.  Patient had an infectious disease visit yesterday where a CMP was obtained which demonstrated potassium of 6.2.  Patient was advised to report to the ED for recheck of potassium.  Patient denies any complaints right now.  He had a recent admission and discharged on 1/19 due to left foot osteomyelitis.  Upon arrival, stable vitals.  Patient no acute distress.  Benign physical exam.  Repeat labs ordered to confirm hyperkalemia. EKG to rule out any changes.  CBC significant for anemia with hemoglobin at 10.1.  CMP with worsening creatinine at 5.08 and BUN at 95.  Hyperkalemia at 5.4.  EKG personally reviewed which demonstrates normal sinus rhythm.  No peaked T waves.  Patient will need admission for AKI and hyperkalemia.  Lokelma started.  7:35 PM Spoke to Dr. Joylene Grapes with nephrology who recommends 1 dose Lokelma. Nephrology will see patient tomorrow during his admission.   8:06 PM Spoke to Dr. Josephine Cables who agrees to admit patient for further treatment.   Co morbidities that complicate the patient evaluation  CDK stage IV  Cardiac Monitoring:  The patient was maintained on a cardiac monitor.  I personally viewed and interpreted the cardiac monitored which showed an underlying rhythm of: NSR  Critical Interventions:  Lokelma for hyperkalemia  Reevaluation:  After the interventions noted above, I reevaluated the patient and found that they have :stayed the same        Final Clinical Impression(s) / ED Diagnoses Final diagnoses:  AKI (acute kidney injury) ([place])  Hyperkalemia    Rx / DC Orders ED Discharge Orders     None         Karie Kirks 10/27/21 2008    Davonna Belling, MD 10/28/21 [phone]

## 2021-10-28 DIAGNOSIS — N184 Chronic kidney disease, stage 4 (severe): Secondary | ICD-10-CM | POA: Diagnosis not present

## 2021-10-28 DIAGNOSIS — I129 Hypertensive chronic kidney disease with stage 1 through stage 4 chronic kidney disease, or unspecified chronic kidney disease: Secondary | ICD-10-CM | POA: Diagnosis not present

## 2021-10-28 DIAGNOSIS — M86172 Other acute osteomyelitis, left ankle and foot: Secondary | ICD-10-CM | POA: Diagnosis not present

## 2021-10-28 DIAGNOSIS — R748 Abnormal levels of other serum enzymes: Secondary | ICD-10-CM

## 2021-10-28 DIAGNOSIS — E875 Hyperkalemia: Secondary | ICD-10-CM

## 2021-10-28 DIAGNOSIS — D631 Anemia in chronic kidney disease: Secondary | ICD-10-CM | POA: Diagnosis not present

## 2021-10-28 DIAGNOSIS — E860 Dehydration: Secondary | ICD-10-CM

## 2021-10-28 DIAGNOSIS — E1129 Type 2 diabetes mellitus with other diabetic kidney complication: Secondary | ICD-10-CM | POA: Diagnosis not present

## 2021-10-28 DIAGNOSIS — E8809 Other disorders of plasma-protein metabolism, not elsewhere classified: Secondary | ICD-10-CM

## 2021-10-28 DIAGNOSIS — N189 Chronic kidney disease, unspecified: Secondary | ICD-10-CM | POA: Diagnosis not present

## 2021-10-28 DIAGNOSIS — E46 Unspecified protein-calorie malnutrition: Secondary | ICD-10-CM

## 2021-10-28 DIAGNOSIS — N179 Acute kidney failure, unspecified: Secondary | ICD-10-CM | POA: Diagnosis not present

## 2021-10-28 DIAGNOSIS — E1140 Type 2 diabetes mellitus with diabetic neuropathy, unspecified: Secondary | ICD-10-CM

## 2021-10-28 LAB — COMPREHENSIVE METABOLIC PANEL
ALT: 33 U/L (ref 0–44)
AST: 22 U/L (ref 15–41)
Albumin: 2.9 g/dL — ABNORMAL LOW (ref 3.5–5.0)
Alkaline Phosphatase: 136 U/L — ABNORMAL HIGH (ref 38–126)
Anion gap: 14 (ref 5–15)
BUN: 95 mg/dL — ABNORMAL HIGH (ref 8–23)
CO2: 18 mmol/L — ABNORMAL LOW (ref 22–32)
Calcium: 8.9 mg/dL (ref 8.9–10.3)
Chloride: 105 mmol/L (ref 98–111)
Creatinine, Ser: 4.47 mg/dL — ABNORMAL HIGH (ref 0.61–1.24)
GFR, Estimated: 14 mL/min — ABNORMAL LOW (ref >60)
Glucose, Bld: 129 mg/dL — ABNORMAL HIGH (ref 70–99)
Potassium: 4.8 mmol/L (ref 3.5–5.1)
Sodium: 137 mmol/L (ref 135–145)
Total Bilirubin: 0.3 mg/dL (ref 0.3–1.2)
Total Protein: 8.8 g/dL — ABNORMAL HIGH (ref 6.5–8.1)

## 2021-10-28 LAB — CBC
HCT: 36.1 % — ABNORMAL LOW (ref 39.0–52.0)
Hemoglobin: 10.6 g/dL — ABNORMAL LOW (ref 13.0–17.0)
MCH: 27.5 pg (ref 26.0–34.0)
MCHC: 29.4 g/dL — ABNORMAL LOW (ref 30.0–36.0)
MCV: 93.5 fL (ref 80.0–100.0)
Platelets: 358 10*3/uL (ref 150–400)
RBC: 3.86 MIL/uL — ABNORMAL LOW (ref 4.22–5.81)
RDW: 16.5 % — ABNORMAL HIGH (ref 11.5–15.5)
WBC: 7.2 10*3/uL (ref 4.0–10.5)
nRBC: 0 % (ref 0.0–0.2)

## 2021-10-28 LAB — GLUCOSE, CAPILLARY
Glucose-Capillary: 139 mg/dL — ABNORMAL HIGH (ref 70–99)
Glucose-Capillary: 135 mg/dL — ABNORMAL HIGH (ref 70–99)
Glucose-Capillary: 182 mg/dL — ABNORMAL HIGH (ref 70–99)

## 2021-10-28 LAB — APTT: aPTT: 29 seconds (ref 24–36)

## 2021-10-28 LAB — MAGNESIUM: Magnesium: 2.4 mg/dL (ref 1.7–2.4)

## 2021-10-28 LAB — PHOSPHORUS: Phosphorus: 6.7 mg/dL — ABNORMAL HIGH (ref 2.5–4.6)

## 2021-10-28 MED ORDER — INSULIN ASPART 100 UNIT/ML IJ SOLN: 0.0000 [IU] | Freq: Every day | INTRAMUSCULAR | Status: DC

## 2021-10-28 MED ORDER — GLUCERNA SHAKE PO LIQD
237.0000 mL | Freq: Three times a day (TID) | ORAL | Status: DC
  Administered 2021-10-28 (×2): 237 mL via ORAL

## 2021-10-28 MED ORDER — DOXYCYCLINE HYCLATE 100 MG PO TABS
100.0000 mg | ORAL_TABLET | Freq: Two times a day (BID) | ORAL | Status: DC
  Administered 2021-10-28: 100 mg via ORAL
  Filled 2021-10-28: qty 1

## 2021-10-28 MED ORDER — PREGABALIN 75 MG PO CAPS
75.0000 mg | ORAL_CAPSULE | Freq: Two times a day (BID) | ORAL | Status: DC
  Administered 2021-10-28: 75 mg via ORAL
  Filled 2021-10-28: qty 1

## 2021-10-28 MED ORDER — AMLODIPINE BESYLATE 5 MG PO TABS
10.0000 mg | ORAL_TABLET | Freq: Every day | ORAL | Status: DC
  Administered 2021-10-28: 10 mg via ORAL
  Filled 2021-10-28: qty 2

## 2021-10-28 MED ORDER — AMOXICILLIN-POT CLAVULANATE 500-125 MG PO TABS
1.0000 | ORAL_TABLET | Freq: Two times a day (BID) | ORAL | Status: DC
  Administered 2021-10-28: 500 mg via ORAL
  Filled 2021-10-28: qty 1

## 2021-10-28 MED ORDER — ENOXAPARIN SODIUM 30 MG/0.3ML IJ SOSY: 30.0000 mg | PREFILLED_SYRINGE | INTRAMUSCULAR | Status: DC

## 2021-10-28 MED ORDER — SODIUM CHLORIDE 0.9 % IV SOLN: INTRAVENOUS | Status: AC

## 2021-10-28 MED ORDER — INSULIN ASPART 100 UNIT/ML IJ SOLN
0.0000 [IU] | Freq: Three times a day (TID) | INTRAMUSCULAR | Status: DC
  Administered 2021-10-28: 1 [IU] via SUBCUTANEOUS
  Administered 2021-10-28: 2 [IU] via SUBCUTANEOUS

## 2021-10-28 MED ORDER — LOKELMA 5 G PO PACK: 5.0000 g | PACK | Freq: Every day | ORAL | 0 refills | Status: AC

## 2021-10-28 MED ORDER — ASPIRIN EC 81 MG PO TBEC
81.0000 mg | DELAYED_RELEASE_TABLET | Freq: Every day | ORAL | Status: DC
  Administered 2021-10-28: 81 mg via ORAL
  Filled 2021-10-28: qty 1

## 2021-10-28 NOTE — Progress Notes (Signed)
S:Mr. [name] has a PMH significant for HTN, DM, HLD, diabetic foot ulcer, and CKD stage IV (followed by Dr. Abigail Butts) who was seen in consultation on 10/10/21 by our service for AKI/CKD stage IV in setting of sepsis and Bactrim.  His Scr returned to baseline and we signed off on 10/14/21.  He was taken off of bactrim, ARB, and SGLT-2 inhibitor during that hospitalization.  He was discharged to home on 10/20/21 with K 4.7 and Cr 4.12.  He was seen by infectious disease on 10/26/21 and labs were drawn.  He was called to present to the hospital due to hyperkalemia (K 6.2, Cr 4.89).  He was given lokelma 5 grams and started on IV NS with improvement of his K and Cr.  We were asked to give advice before discharge on management.  He does have an appointment with Dr. Abigail Butts next week. O:BP (!) 168/86 (BP Location: Right Arm)    Pulse 78    Temp 98.6 F (37 C)    Resp 18    Ht 6' (1.829 m)    Wt 84.5 kg Comment: previous weight was done with a boot on   SpO2 100%    BMI 25.27 kg/m   Intake/Output Summary (Last 24 hours) at 10/28/2021 0928 Last data filed at 10/28/2021 0759 Gross per 24 hour  Intake 720.24 ml  Output 2225 ml  Net -1504.76 ml   Intake/Output: I/O last 3 completed shifts: In: 720.2 [P.O.:720; I.V.:0.2] Out: 1025 [Urine:1025]  Intake/Output this shift:  Total I/O In: -  Out: 1200 [Urine:1200] Weight change:  Gen:NAD CVS: RRR Resp:CTA Abd: +BS, soft, NT/Nd Ext: no edema wound vac on left foot.  Recent Labs  Lab 10/26/21 1122 10/27/21 1739 10/28/21 0452  NA 134* 136 137  K 6.2* 5.4* 4.8  CL 103 107 105  CO2 20 21* 18*  GLUCOSE 186* 85 129*  BUN 89* 95* 95*  CREATININE 4.89* 5.08* 4.47*  ALBUMIN  --  2.9* 2.9*  CALCIUM 9.0 8.9 8.9  PHOS  --   --  6.7*  AST 20 21 22   ALT 37 37 33   Liver Function Tests: Recent Labs  Lab 10/26/21 1122 10/27/21 1739 10/28/21 0452  AST 20 21 22   ALT 37 37 33  ALKPHOS  --  149* 136*  BILITOT 0.3 0.1* 0.3  PROT 8.6* 9.1* 8.8*  ALBUMIN  --   2.9* 2.9*   No results for input(s): LIPASE, AMYLASE in the last 168 hours. No results for input(s): AMMONIA in the last 168 hours. CBC: Recent Labs  Lab 10/26/21 1122 10/27/21 1739 10/28/21 0452  WBC 10.7 9.2 7.2  NEUTROABS 8,025*  --   --   HGB 10.1* 10.1* 10.6*  HCT 32.0* 33.3* 36.1*  MCV 88.4 93.0 93.5  PLT 427* 401* 358   Cardiac Enzymes: No results for input(s): CKTOTAL, CKMB, CKMBINDEX, TROPONINI in the last 168 hours. CBG: Recent Labs  Lab 10/28/21 0419 10/28/21 0720  GLUCAP 139* 135*    Iron Studies: No results for input(s): IRON, TIBC, TRANSFERRIN, FERRITIN in the last 72 hours. Studies/Results: No results found.  amLODipine  10 mg Oral Daily   amoxicillin-clavulanate  1 tablet Oral Q12H   aspirin EC  81 mg Oral Daily   doxycycline  100 mg Oral Q12H   enoxaparin (LOVENOX) injection  30 mg Subcutaneous Q24H   feeding supplement (GLUCERNA SHAKE)  237 mL Oral TID BM   insulin aspart  0-5 Units Subcutaneous QHS   insulin  aspart  0-9 Units Subcutaneous TID WC   pregabalin  75 mg Oral BID    BMET    Component Value Date/Time   NA 137 10/28/2021 0452   K 4.8 10/28/2021 0452   CL 105 10/28/2021 0452   CO2 18 (L) 10/28/2021 0452   GLUCOSE 129 (H) 10/28/2021 0452   BUN 95 (H) 10/28/2021 0452   CREATININE 4.47 (H) 10/28/2021 0452   CREATININE 4.89 (H) 10/26/2021 1122   CALCIUM 8.9 10/28/2021 0452   GFRNONAA 14 (L) 10/28/2021 0452   CBC    Component Value Date/Time   WBC 7.2 10/28/2021 0452   RBC 3.86 (L) 10/28/2021 0452   HGB 10.6 (L) 10/28/2021 0452   HCT 36.1 (L) 10/28/2021 0452   PLT 358 10/28/2021 0452   MCV 93.5 10/28/2021 0452   MCH 27.5 10/28/2021 0452   MCHC 29.4 (L) 10/28/2021 0452   RDW 16.5 (H) 10/28/2021 0452   LYMPHSABS 1,808 10/26/2021 1122   MONOABS 1.7 (H) 10/10/2021 0028   EOSABS 75 10/26/2021 1122   BASOSABS 75 10/26/2021 1122     Assessment/Plan:  Hyperkalemia - resolved.  Would recommend discharge with Lokelma 5 grams po  daily and follow up with Dr. Abigail Butts.  Also make sure he is not taking Losartan or bactrim after discharge home. AKI/CKD stage IV - Scr back at baseline.  Follow up with Dr. Abigail Butts. Left foot osteomyelitis - s/p incision and drainage, on augmentin and doxycycline. Anemia of CKD - stable Disposition - stable for discharge today with above recommendations.   Donetta Potts, MD [employer] [phone]

## 2021-10-28 NOTE — Discharge Summary (Signed)
Physician Discharge Summary  [name] EXH:[id number] DOB: 1951-07-02 DOA: 10/27/2021  PCP: [facility] date: 10/27/2021  Discharge date: 10/28/2021  Admitted From:Home  Disposition:  Home  Recommendations for Outpatient Follow-up:  Follow up with PCP in 1-2 weeks Check BMP in 1 week Follow-up with nephrology Dr. Juleen [name] in the next 1 week as scheduled Continue on Lokelma 5 mg daily as prescribed Stay off losartan Continue antibiotics and follow-up with ID as previously scheduled  Home Health: None  Equipment/Devices: Has wound VAC to left foot  Discharge Condition:Stable  CODE STATUS: Full  Diet recommendation: Heart Healthy/carb modified  Brief/Interim Summary:  [name] is a 71 y.o. male with medical history significant for CKD stage IV, T2DM, hypertension, hyperlipidemia who presents to the emergency department due to elevated potassium level. Patient had a follow-up with his infectious disease specialist due to left foot osteomyelitis 2 days ago, blood work was done and resulted today, it was noted that he had hyperkalemia with potassium of 6.2, patient was asked to go to the ED for further evaluation and management.  Patient was admitted with AKI on CKD stage IV along with hyperkalemia which has improved with IV fluid hydration as well as administration of Lokelma.  His potassium levels have normalized and he has been seen by nephrology with recommendations to continue on Lokelma daily as prescribed above and remain off of antibiotics such as Bactrim and to stay off of losartan.  His blood pressures have been stable with his home amlodipine and he will also remain on torsemide.  He has close follow-up with his nephrologist within the next 1 week and will need reevaluation of BMP in the outpatient setting.  Discharge Diagnoses:  Active Problems:   Hyperlipidemia, mixed   Hypertension, essential   Type 2 diabetes mellitus with  peripheral neuropathy (HCC)   Acute kidney injury superimposed on CKD (HCC)   Hyperkalemia   Hypoalbuminemia due to protein-calorie malnutrition (HCC)   Dehydration   Elevated alkaline phosphatase level   Acute osteomyelitis of left foot (HCC)   Diabetic neuropathy ([place])  Principal discharge diagnosis: AKI on CKD stage IV with noted hyperkalemia on lab work.  Discharge Instructions  Discharge Instructions     Diet - low sodium heart healthy   Complete by: As directed    Increase activity slowly   Complete by: As directed       Allergies as of 10/28/2021   No Known Allergies      Medication List     STOP taking these medications    losartan 100 MG tablet Commonly known as: COZAAR       TAKE these medications    amLODipine 10 MG tablet Commonly known as: NORVASC Take 10 mg by mouth daily. What changed: Another medication with the same name was removed. Continue taking this medication, and follow the directions you see here.   amoxicillin-clavulanate 500-125 MG tablet Commonly known as: AUGMENTIN Take 1 tablet (500 mg total) by mouth every 12 (twelve) hours.   aspirin EC 81 MG tablet Take 81 mg by mouth daily. Swallow whole.   atorvastatin 80 MG tablet Commonly known as: LIPITOR Take 1 tablet by mouth every evening. What changed: Another medication with the same name was removed. Continue taking this medication, and follow the directions you see here.   docusate sodium 100 MG capsule Commonly known as: COLACE Take 200 mg by mouth daily.   doxycycline 100 MG tablet Commonly known as: VIBRA-TABS  Take 1 tablet (100 mg total) by mouth every 12 (twelve) hours.   Dulaglutide 4.5 MG/0.5ML Sopn Inject 4.5 mg into the skin every Wednesday.   glucose blood test strip daily.   HYDROcodone-acetaminophen 5-325 MG tablet Commonly known as: NORCO/VICODIN Take 1 tablet by mouth every 6 (six) hours as needed for moderate pain.   Lokelma 5 g packet Generic drug:  sodium zirconium cyclosilicate Take 5 g by mouth daily.   NovoLOG Mix 70/30 FlexPen (70-30) 100 UNIT/ML FlexPen Generic drug: insulin aspart protamine - aspart Inject 32 Units into the skin 2 (two) times daily with a meal.   onetouch ultrasoft lancets daily.   pregabalin 75 MG capsule Commonly known as: LYRICA Take 75 mg by mouth 2 (two) times daily.   sildenafil 50 MG tablet Commonly known as: VIAGRA Take 50 mg by mouth as needed for erectile dysfunction.   torsemide 20 MG tablet Commonly known as: DEMADEX Take 20 mg by mouth daily.   True Metrix Meter w/Device Kit   UltiCare Mini Pen Needles 31G X 6 MM Misc Generic drug: Insulin Pen Needle   UNABLE TO FIND daily.        Follow-up Information     [facility] Schedule an appointment as soon as possible for a visit in 1 week(s).   Contact information: [address] [place] [address] [phone]         [name] Dana, MD. Go to.   Specialty: Nephrology Contact information: [address] [place] [address] [phone]                No Known Allergies  Consultations: Nephrology   Procedures/Studies: US RENAL  Result Date: 10/10/2021 CLINICAL DATA:  Acute kidney injury EXAM: RENAL / URINARY TRACT ULTRASOUND COMPLETE COMPARISON:  11/20/2018 FINDINGS: Right Kidney: Renal measurements: 9.5 x 4.9 x 5.3 cm = volume: 129.3 mL. Cortex is echogenic. Trace perinephric fluid. No hydronephrosis. Upper pole cyst measuring 3.7 cm. Midpole parapelvic cyst measuring 14 mm. Left Kidney: Renal measurements: 9 x 5.1 x 4.8 cm = volume: 110.3 mL. Cortex is echogenic. No hydronephrosis. Small cyst lower pole measuring 15 mm. Bladder: Appears normal for degree of bladder distention. Other: None. IMPRESSION: 1. Echogenic kidneys consistent with medical renal disease. No hydronephrosis 2. Simple appearing renal cysts. Electronically Signed   By: Donavan Foil M.D.   On:  10/10/2021 15:43   MR FOOT LEFT WO CONTRAST  Result Date: 10/14/2021 CLINICAL DATA:  Osteomyelitis. Status post left foot irrigation and debridement as well as amputation of the left first metatarsal. Admitted 01/09 for sepsis. 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 EXAM: MRI OF THE LEFT FOOT WITHOUT CONTRAST TECHNIQUE: Multiplanar, multisequence MR imaging of the left forefoot was performed. No intravenous contrast was administered. COMPARISON:  Left foot radiographs 10/06/2021 and 10/10/2021, MRI left forefoot 10/11/2021 FINDINGS: Bones/Joint/Cartilage There is metallic artifact seen throughout the distal medial forefoot in the region of new interval first ray amputation compared to 10/10/2021. There is now amputation to the distal shaft of the first metatarsal, where previously there was amputation to the proximal aspect of the proximal phalanx. There is moderate regional soft tissue swelling of the mid to distal medial aspect of the foot and surrounding the postsurgical aspect of the resected distal first metatarsal. There is mild expected erosion of the stump of the postsurgical first metatarsal shaft consistent with recent surgery. Mild marrow edema throughout the remaining shaft of the first metatarsal.  There is mild fluid within the soft tissues at the distal amputation stump. There is metallic artifact overlying the distal shaft of second metatarsal where there is again amputation to this level. There is again partial amputation of the third metatarsal head and adjacent base of the proximal phalanx of the third toe. Ligaments The Lisfranc ligament complex is intact. Muscles and Tendons No tendon tear is identified. Soft tissues Mild edema within the plantar foot musculature. Mild soft tissue edema and swelling of the dorsal lateral midfoot. IMPRESSION:: IMPRESSION: 1. Interval further amputation of the first ray, now to the distal shaft. Expected postoperative changes including soft  tissue swelling and mild fluid around the amputation stump. 2. More remote amputations of the distal second and third metatarsals, as above. Electronically Signed   By: [name] M.D.   On: 10/14/2021 15:21   MR FOOT LEFT WO CONTRAST  Result Date: 10/11/2021 CLINICAL DATA:  Chronic left foot ulcer.  Foot pain and swelling. EXAM: MRI OF THE LEFT FOOT WITHOUT CONTRAST TECHNIQUE: Multiplanar, multisequence MR imaging of the left foot was performed. No intravenous contrast was administered. 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 Soft tissue wound along the distal plantar medial aspect of the first TMT joint with a sinus tract extending into the soft tissues with focal area of inflammatory soft tissue along the plantar aspect of the foot concerning for a phlegmon. Heterogeneous low signal material within the area of the phlegmon likely reflecting packing material. Severe bone marrow edema in the first metatarsal head and sesamoids concerning for osteomyelitis. No other marrow signal abnormality. Normal alignment. No joint effusion. No marrow signal abnormality. Ligaments Collateral ligaments are intact.  Lisfranc ligament is intact. Muscles and Tendons Flexor, peroneal and extensor compartment tendons are intact. Muscles are normal. Soft tissue No fluid collection or hematoma.  No soft tissue mass. IMPRESSION: 1. 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 Soft tissue wound along the distal plantar medial aspect of the first TMT joint with a sinus tract extending into the soft tissues with focal area of inflammatory soft tissue along the plantar aspect of the foot concerning for a phlegmon. Heterogeneous low signal material within the area of the phlegmon likely reflecting packing material. 2. Severe bone marrow edema in the first metatarsal head and sesamoids concerning for osteomyelitis. Electronically Signed   By: Kathreen Devoid M.D.   On: 10/11/2021 10:49   MR ANKLE LEFT WO CONTRAST  Result Date: 10/15/2021 CLINICAL DATA:  Diabetic foot ulcer EXAM: MRI OF THE LEFT ANKLE WITHOUT CONTRAST TECHNIQUE: Multiplanar, multisequence MR imaging of the ankle was performed. No intravenous contrast was administered. COMPARISON:  X-ray 10/10/2021.  MRI forefoot 10/14/2021 FINDINGS: TENDONS Peroneal: Peroneus longus and brevis tendons are intact and normally positioned. Posteromedial: Tibialis posterior, flexor hallucis longus, and flexor digitorum longus tendons are intact and normally positioned. Anterior: Tibialis anterior, extensor hallucis longus, and extensor digitorum longus tendons are intact and normally positioned. Achilles: Intact. Plantar Fascia: Thickening of the central band of the plantar fascia without definite tear. LIGAMENTS Lateral: Intact tibiofibular ligaments. The anterior and posterior talofibular ligaments are intact. Intact calcaneofibular ligament. Medial: The deltoid and visualized portions of the spring ligament appear intact. CARTILAGE AND BONES Ankle Joint: No tibiotalar joint effusion. The talar dome and tibial plafond are intact. Subtalar Joints/Sinus Tarsi: No cartilage defect. No effusion. Preservation of the anatomic fat within the sinus tarsi. Bones: Minimal subcortical bone marrow edema within the  plantar-medial aspect of the calcaneus at the attachment site of the medial band of the plantar fascia (series 3, image 20). No confluent low T1 marrow signal. No focal erosion. The remaining osseous structures demonstrate preserved bone marrow signal. No fracture or malalignment. Mild midfoot arthropathy. Other: Deep soft tissue ulceration at the medial aspect of the hindfoot. Surrounding soft tissue edema at the ulcer base. Ill-defined fluid collection along the superior margin of the ulceration measures approximately 2.3 x 1.1 x 2.2 cm (series 3, image 20). Additionally there is a elongated fluid collection  within the proximal abductor hallucis muscle measuring 3.7 x 0.8 x 1.7 cm (series 9, image 14). Diffusely increased T2 signal throughout the visualized lower leg and foot musculature. IMPRESSION: 1. Deep soft tissue ulceration at the medial aspect of the hindfoot with surrounding soft tissue edema. Ill-defined fluid collection along the superior margin of the ulceration measures 2.3 x 1.1 x 2.2 cm and is most compatible with a phlegmon/developing abscess. 2. Minimal subcortical bone marrow edema within the plantar-medial aspect of the calcaneus at the attachment site of the medial band of the plantar fascia. Marrow signal may be reactive secondary to plantar fasciitis or represent early changes of acute osteomyelitis given proximity to the ulceration. 3. Diffusely increased signal throughout the visualized musculature suggesting myositis. There is an elongated intramuscular abscess within the proximal abductor hallucis muscle measuring 3.7 x 0.8 x 1.7 cm. Electronically Signed   By: Davina Poke D.O.   On: 10/15/2021 13:08   DG Foot Complete Left  Result Date: 10/21/2021 Please see detailed radiograph report in office note.  DG Foot Complete Left  Result Date: 10/10/2021 CLINICAL DATA:  Infection, concern for osteomyelitis. EXAM: LEFT FOOT - COMPLETE 3+ VIEW COMPARISON:  10/06/2021. FINDINGS: There has been amputation of the first and second digits and heads of the second and third metatarsals. Soft tissue swelling is seen predominantly in the medial aspect of the midfoot and plantar aspect of the hindfoot. There is deformity of the skin along the base of the forefoot, possible ulceration. No acute fracture or dislocation. Bony erosions are present along the inferior aspect of the presumed first metatarsal on the lateral view. Mild calcaneal spurring is noted. Vascular calcifications are present in the soft tissues. IMPRESSION: 1. Soft tissue swelling with subcutaneous air in the medial and plantar aspect  of the mid and hindfoot, compatible with history of infection. 2. Suspected ulceration along the plantar aspect of the forefoot. There are bony erosions along the plantar aspect of the presumed first metatarsal on the lateral view, concerning for osteomyelitis. MRI is suggested for further evaluation. Electronically Signed   By: Brett Fairy M.D.   On: 10/10/2021 01:29   VAS [place] ABI WITH/WO TBI  Result Date: 10/10/2021  LOWER EXTREMITY DOPPLER STUDY Patient Name:  [name]  Date of Exam:   10/10/2021 Medical Rec #: [id number]      Accession #:    [id number] Date of Birth: [name], 1951      Patient Gender: M Patient Age:   25 years Exam Location:  [facility] Procedure:      VAS [place] ABI WITH/WO TBI Referring Phys: JARED GARDNER --------------------------------------------------------------------------------  Indications: Ulceration left foot. High Risk Factors: Hypertension, hyperlipidemia, Diabetes, prior CVA.  Vascular Interventions: Lt great toe amputation. Comparison Study: no prior Performing Technologist: Archie Patten RVS  Examination Guidelines: A complete evaluation includes at minimum, Doppler waveform signals and systolic blood pressure reading at the level of bilateral brachial, anterior tibial,  and posterior tibial arteries, when vessel segments are accessible. Bilateral testing is considered an integral part of a complete examination. Photoelectric Plethysmograph (PPG) waveforms and toe systolic pressure readings are included as required and additional duplex testing as needed. Limited examinations for reoccurring indications may be performed as noted.  ABI Findings: +--------+------------------+-----+---------+--------+  Right    Rt Pressure (mmHg) Index Waveform  Comment   +--------+------------------+-----+---------+--------+  Brachial 177                      triphasic           +--------+------------------+-----+---------+--------+  PTA      182                1.03  triphasic            +--------+------------------+-----+---------+--------+  DP       152                0.86  triphasic           +--------+------------------+-----+---------+--------+ +---------+------------------+-----+---------+--------------------+  Left      Lt Pressure (mmHg) Index Waveform  Comment               +---------+------------------+-----+---------+--------------------+  Brachial  167                      triphasic                       +---------+------------------+-----+---------+--------------------+  PTA       179                1.01  biphasic                        +---------+------------------+-----+---------+--------------------+  DP        145                0.82  biphasic                        +---------+------------------+-----+---------+--------------------+  Great Toe                                    great toe amputation  +---------+------------------+-----+---------+--------------------+ +-------+-----------+-----------+------------+------------+  ABI/TBI Today's ABI Today's TBI Previous ABI Previous TBI  +-------+-----------+-----------+------------+------------+  Right   1.03                                               +-------+-----------+-----------+------------+------------+  Left    1.01                                               +-------+-----------+-----------+------------+------------+  Summary: Right: Resting right ankle-brachial index is within normal range. No evidence of significant right lower extremity arterial disease. Left: Resting left ankle-brachial index is within normal range. No evidence of significant left lower extremity arterial disease.  *See table(s) above for measurements and observations.  Electronically signed by Servando Snare MD on 10/10/2021 at 5:55:43 PM.    Final      Discharge Exam: Vitals:   10/28/21 1007 10/28/21 1219  BP: (!) 146/76 (!) 168/86  Pulse: 80 78  Resp: 18 18  Temp: 98.1 F (36.7 C) 98.6 F (37 C)  SpO2: 100% 100%   Vitals:   10/28/21 0215  10/28/21 0356 10/28/21 0416 10/28/21 0750  BP: (!) 161/73 (!) 161/73 (!) 146/76 (!) 168/86  Pulse: 80 80 80 78  Resp: '18 18 18 18  ' Temp: 97.7 F (36.5 C) 97.7 F (36.5 C) 98.1 F (36.7 C) 98.6 F (37 C)  TempSrc: Oral Oral    SpO2: 100%  100% 100%  Weight:  87.2 kg 84.5 kg   Height:  6' (1.829 m)      General: Pt is alert, awake, not in acute distress Cardiovascular: RRR, S1/S2 +, no rubs, no gallops Respiratory: CTA bilaterally, no wheezing, no rhonchi Abdominal: Soft, NT, ND, bowel sounds + Extremities: no edema, no cyanosis, left foot dressing is clean dry and intact and attached to wound VAC    The results of significant diagnostics from this hospitalization (including imaging, microbiology, ancillary and laboratory) are listed below for reference.     Microbiology: Recent Results (from the past 240 hour(s))  Resp Panel by RT-PCR (Flu A&B, Covid) Nasopharyngeal Swab     Status: None   Collection Time: 10/27/21  7:27 PM   Specimen: Nasopharyngeal Swab; Nasopharyngeal(NP) swabs in vial transport medium  Result Value Ref Range Status   SARS Coronavirus 2 by RT PCR NEGATIVE NEGATIVE Final    Comment: (NOTE) SARS-CoV-2 target nucleic acids are NOT DETECTED.  The SARS-CoV-2 RNA is generally detectable in upper respiratory specimens during the acute phase of infection. The lowest concentration of SARS-CoV-2 viral copies this assay can detect is 138 copies/mL. A negative result does not preclude SARS-Cov-2 infection and should not be used as the sole basis for treatment or other patient management decisions. A negative result may occur with  improper specimen collection/handling, submission of specimen other than nasopharyngeal swab, presence of viral mutation(s) within the areas targeted by this assay, and inadequate number of viral copies(<138 copies/mL). A negative result must be combined with clinical observations, patient history, and epidemiological information. The  expected result is Negative.  Fact Sheet for Patients:  EntrepreneurPulse.com.au  Fact Sheet for Healthcare Providers:  IncredibleEmployment.be  This test is no t yet approved or cleared by the [place] FDA and  has been authorized for detection and/or diagnosis of SARS-CoV-2 by FDA under an Emergency Use Authorization (EUA). This EUA will remain  in effect (meaning this test can be used) for the duration of the COVID-19 declaration under Section 564(b)(1) of the Act, 21 U.S.C.section 360bbb-3(b)(1), unless the authorization is terminated  or revoked sooner.       Influenza A by PCR NEGATIVE NEGATIVE Final   Influenza B by PCR NEGATIVE NEGATIVE Final    Comment: (NOTE) The Xpert Xpress SARS-CoV-2/FLU/RSV plus assay is intended as an aid in the diagnosis of influenza from Nasopharyngeal swab specimens and should not be used as a sole basis for treatment. Nasal washings and aspirates are unacceptable for Xpert Xpress SARS-CoV-2/FLU/RSV testing.  Fact Sheet for Patients: EntrepreneurPulse.com.au  Fact Sheet for Healthcare Providers: IncredibleEmployment.be  This test is not yet approved or cleared by the [place] FDA and has been authorized for detection and/or diagnosis of SARS-CoV-2 by FDA under an Emergency Use Authorization (EUA). This EUA will remain in effect (meaning this test can be used) for the duration of the COVID-19 declaration under Section 564(b)(1) of the Act, 21 U.S.C. section 360bbb-3(b)(1), unless  the authorization is terminated or revoked.  Performed at [facility], [address] [place], [place] [address]      Labs: BNP (last 3 results) No results for input(s): BNP in the last 8760 hours. Basic Metabolic Panel: Recent Labs  Lab 10/26/21 1122 10/27/21 1739 10/28/21 0452  NA 134* 136 137  K 6.2* 5.4* 4.8  CL 103 107 105  CO2 20 21* 18*  GLUCOSE 186* 85 129*  BUN  89* 95* 95*  CREATININE 4.89* 5.08* 4.47*  CALCIUM 9.0 8.9 8.9  MG  --   --  2.4  PHOS  --   --  6.7*   Liver Function Tests: Recent Labs  Lab 10/26/21 1122 10/27/21 1739 10/28/21 0452  AST '20 21 22  ' ALT 37 37 33  ALKPHOS  --  149* 136*  BILITOT 0.3 0.1* 0.3  PROT 8.6* 9.1* 8.8*  ALBUMIN  --  2.9* 2.9*   No results for input(s): LIPASE, AMYLASE in the last 168 hours. No results for input(s): AMMONIA in the last 168 hours. CBC: Recent Labs  Lab 10/26/21 1122 10/27/21 1739 10/28/21 0452  WBC 10.7 9.2 7.2  NEUTROABS 8,025*  --   --   HGB 10.1* 10.1* 10.6*  HCT 32.0* 33.3* 36.1*  MCV 88.4 93.0 93.5  PLT 427* 401* 358   Cardiac Enzymes: No results for input(s): CKTOTAL, CKMB, CKMBINDEX, TROPONINI in the last 168 hours. BNP: Invalid input(s): POCBNP CBG: Recent Labs  Lab 10/28/21 0419 10/28/21 0720  GLUCAP 139* 135*   D-Dimer No results for input(s): DDIMER in the last 72 hours. Hgb A1c No results for input(s): HGBA1C in the last 72 hours. Lipid Profile No results for input(s): CHOL, HDL, LDLCALC, TRIG, CHOLHDL, LDLDIRECT in the last 72 hours. Thyroid function studies No results for input(s): TSH, T4TOTAL, T3FREE, THYROIDAB in the last 72 hours.  Invalid input(s): FREET3 Anemia work up No results for input(s): VITAMINB12, FOLATE, FERRITIN, TIBC, IRON, RETICCTPCT in the last 72 hours. Urinalysis    Component Value Date/Time   COLORURINE YELLOW 10/10/2021 2033   APPEARANCEUR CLEAR 10/10/2021 2033   LABSPEC 1.015 10/10/2021 2033   PHURINE 6.0 10/10/2021 2033   GLUCOSEU NEGATIVE 10/10/2021 2033   HGBUR MODERATE (A) 10/10/2021 2033   BILIRUBINUR NEGATIVE 10/10/2021 2033   [place] NEGATIVE 10/10/2021 2033   PROTEINUR 100 (A) 10/10/2021 2033   NITRITE NEGATIVE 10/10/2021 2033   LEUKOCYTESUR NEGATIVE 10/10/2021 2033   Sepsis Labs Invalid input(s): PROCALCITONIN,  WBC,  LACTICIDVEN Microbiology Recent Results (from the past 240 hour(s))  Resp Panel by  RT-PCR (Flu A&B, Covid) Nasopharyngeal Swab     Status: None   Collection Time: 10/27/21  7:27 PM   Specimen: Nasopharyngeal Swab; Nasopharyngeal(NP) swabs in vial transport medium  Result Value Ref Range Status   SARS Coronavirus 2 by RT PCR NEGATIVE NEGATIVE Final    Comment: (NOTE) SARS-CoV-2 target nucleic acids are NOT DETECTED.  The SARS-CoV-2 RNA is generally detectable in upper respiratory specimens during the acute phase of infection. The lowest concentration of SARS-CoV-2 viral copies this assay can detect is 138 copies/mL. A negative result does not preclude SARS-Cov-2 infection and should not be used as the sole basis for treatment or other patient management decisions. A negative result may occur with  improper specimen collection/handling, submission of specimen other than nasopharyngeal swab, presence of viral mutation(s) within the areas targeted by this assay, and inadequate number of viral copies(<138 copies/mL). A negative result must be combined with clinical observations, patient  history, and epidemiological information. The expected result is Negative.  Fact Sheet for Patients:  EntrepreneurPulse.com.au  Fact Sheet for Healthcare Providers:  IncredibleEmployment.be  This test is no t yet approved or cleared by the [place] FDA and  has been authorized for detection and/or diagnosis of SARS-CoV-2 by FDA under an Emergency Use Authorization (EUA). This EUA will remain  in effect (meaning this test can be used) for the duration of the COVID-19 declaration under Section 564(b)(1) of the Act, 21 U.S.C.section 360bbb-3(b)(1), unless the authorization is terminated  or revoked sooner.       Influenza A by PCR NEGATIVE NEGATIVE Final   Influenza B by PCR NEGATIVE NEGATIVE Final    Comment: (NOTE) The Xpert Xpress SARS-CoV-2/FLU/RSV plus assay is intended as an aid in the diagnosis of influenza from Nasopharyngeal swab  specimens and should not be used as a sole basis for treatment. Nasal washings and aspirates are unacceptable for Xpert Xpress SARS-CoV-2/FLU/RSV testing.  Fact Sheet for Patients: EntrepreneurPulse.com.au  Fact Sheet for Healthcare Providers: IncredibleEmployment.be  This test is not yet approved or cleared by the [place] FDA and has been authorized for detection and/or diagnosis of SARS-CoV-2 by FDA under an Emergency Use Authorization (EUA). This EUA will remain in effect (meaning this test can be used) for the duration of the COVID-19 declaration under Section 564(b)(1) of the Act, 21 U.S.C. section 360bbb-3(b)(1), unless the authorization is terminated or revoked.  Performed at [facility], [address] [place], [place] [address]      Time coordinating discharge: 35 minutes  SIGNED:   Rodena Goldmann, DO [facility] 10/28/2021, 9:33 AM  If 7PM-7AM, please contact night-coverage [email or web address]

## 2021-10-29 NOTE — ED Provider Notes (Signed)
[name]   [id number] 10/26/21 Arrival Time: 1915  ASSESSMENT & PLAN:  1. Hyperkalemia    No ECG changes consistent with hyperK here. Discussed. Was going to repeat K but unfortunately someone has stolen our [place] box this afternoon. He will call his doctor in the morning for an order to have K rechecked. Any new or concerning symptoms, he agrees to ED eval this evening. Discharged home.    Follow-up Information     Laurice Record, MD.   Specialty: Infectious Diseases Why: Call in regards to having your potassium level checked tomorrow. Contact information: [address] [place] [address] [phone]                 Reviewed expectations re: course of current medical issues. Questions answered. Outlined signs and symptoms indicating need for more acute intervention. Understanding verbalized. After Visit Summary given.   SUBJECTIVE: History from: patient. [name] is a 71 y.o. male who reports that his ID doctor told him his K was high. Sent here for ECG. No CP or palpitations reported. Normal PO intake without n/v/d.  OBJECTIVE:  Vitals:   10/26/21 1927 10/26/21 1928  BP: (!) 168/78   Pulse: (!) 103   Resp: 18   Temp: 97.6 F (36.4 C)   TempSrc: Oral   SpO2: 97%   Weight:  88.4 kg  Height:  6' (1.829 m)    Recheck P: 92 General appearance: alert; no distress CV: reg Lungs: speaks full sentences without difficulty; unlabored; CTAB Extremities: no edema Skin: warm and dry Neurologic: normal gait Psychological: alert and cooperative; normal mood and affect   No Known Allergies  Past Medical History:  Diagnosis Date   Chronic kidney disease    stage 4-Cr 3.3 11-19-19   Diabetes mellitus without complication (HCC)    Diabetic ulcer of foot associated with diabetes mellitus due to underlying condition, limited to breakdown of skin (HCC)    left   GERD (gastroesophageal reflux disease)    Hyperlipidemia     Hypertension    Non compliance w medication regimen    Stroke ([place]) 2016   no deficits   Social History   Socioeconomic History   Marital status: Married    Spouse name: Not on file   Number of children: Not on file   Years of education: Not on file   Highest education level: Not on file  Occupational History   Not on file  Tobacco Use   Smoking status: Former   Smokeless tobacco: Never  Substance and Sexual Activity   Alcohol use: Not Currently   Drug use: Never   Sexual activity: Not on file  Other Topics Concern   Not on file  Social History Narrative   Not on file   Social Determinants of Health   Financial Resource Strain: Not on file  Food Insecurity: Not on file  Transportation Needs: Not on file  Physical Activity: Not on file  Stress: Not on file  Social Connections: Not on file  Intimate Partner Violence: Not on file   History reviewed. No pertinent family history. Past Surgical History:  Procedure Laterality Date   AMPUTATION OF REPLICATED TOES Left 4132   left big toe, secont toe   APPENDECTOMY     BIOPSY  08/10/2020   Procedure: BIOPSY;  Surgeon: Harvel Quale, MD;  [facility];  Service: Gastroenterology;;   CHOLECYSTECTOMY     COLON SURGERY     COLONOSCOPY  WITH PROPOFOL N/A 08/10/2020   Procedure: COLONOSCOPY WITH PROPOFOL;  Surgeon: Harvel Quale, MD;  [facility];  Service: Gastroenterology;  Laterality: N/A;  747   GRAFT APPLICATION Left 12/03/368   Procedure: GRAFT APPLICATION;  Surgeon: [name], DPM;  Location: [facility];  Service: Podiatry;  Laterality: Left;   HERNIA REPAIR     I & D EXTREMITY Left 10/10/2021   Procedure: IRRIGATION AND DEBRIDEMENT left foot;  Surgeon: Evelina Bucy, DPM;  Location: [facility];  Service: Podiatry;  Laterality: Left;   IRRIGATION AND DEBRIDEMENT FOOT Left 10/16/2021   Procedure: IRRIGATION AND DEBRIDEMENT FOOT;  Surgeon: [name], DPM;   Location: [facility];  Service: Podiatry;  Laterality: Left;  Abscess left heel   METATARSAL HEAD EXCISION Left 12/08/2019   Procedure: METATARSAL HEAD RECESSION THIRD LEFT;  Surgeon: [name], DPM;  Location: [facility];  Service: Podiatry;  Laterality: Left;   POLYPECTOMY  08/10/2020   Procedure: POLYPECTOMY;  Surgeon: Harvel Quale, MD;  [facility];  Service: Gastroenterology;;   WOUND DEBRIDEMENT Left 12/08/2019   Procedure: DEBRIDEMENT WOUND;  Surgeon: [name], DPM;  Location: [facility];  Service: Podiatry;  Laterality: Left;   WOUND DEBRIDEMENT Left 10/12/2021   Procedure: LEFT FOOT WOUND DEBRIDEMENT AND IRRIGATION, 1ST METATARSAL RESECTION;  Surgeon: Evelina Bucy, DPM;  Location: [facility];  Service: Podiatry;  Laterality: Left;     Vanessa Kick, MD 10/29/21 1450

## 2021-11-02 DIAGNOSIS — D638 Anemia in other chronic diseases classified elsewhere: Secondary | ICD-10-CM | POA: Diagnosis not present

## 2021-11-02 DIAGNOSIS — Z713 Dietary counseling and surveillance: Secondary | ICD-10-CM | POA: Diagnosis not present

## 2021-11-02 DIAGNOSIS — E782 Mixed hyperlipidemia: Secondary | ICD-10-CM | POA: Diagnosis not present

## 2021-11-02 DIAGNOSIS — Z09 Encounter for follow-up examination after completed treatment for conditions other than malignant neoplasm: Secondary | ICD-10-CM | POA: Diagnosis not present

## 2021-11-02 DIAGNOSIS — Z6826 Body mass index (BMI) 26.0-26.9, adult: Secondary | ICD-10-CM | POA: Diagnosis not present

## 2021-11-02 DIAGNOSIS — N184 Chronic kidney disease, stage 4 (severe): Secondary | ICD-10-CM | POA: Diagnosis not present

## 2021-11-02 DIAGNOSIS — E1122 Type 2 diabetes mellitus with diabetic chronic kidney disease: Secondary | ICD-10-CM | POA: Diagnosis not present

## 2021-11-02 DIAGNOSIS — I10 Essential (primary) hypertension: Secondary | ICD-10-CM | POA: Diagnosis not present

## 2021-11-02 DIAGNOSIS — M869 Osteomyelitis, unspecified: Secondary | ICD-10-CM | POA: Diagnosis not present

## 2021-11-02 NOTE — Telephone Encounter (Signed)
Ammie faxed over the orders to [facility] on 10-24-2021. [name]

## 2021-11-03 ENCOUNTER — Other Ambulatory Visit: Payer: Self-pay

## 2021-11-03 ENCOUNTER — Ambulatory Visit: Payer: Medicare HMO | Admitting: Sports Medicine

## 2021-11-03 DIAGNOSIS — Z09 Encounter for follow-up examination after completed treatment for conditions other than malignant neoplasm: Secondary | ICD-10-CM

## 2021-11-03 DIAGNOSIS — L03119 Cellulitis of unspecified part of limb: Secondary | ICD-10-CM

## 2021-11-03 DIAGNOSIS — M79672 Pain in left foot: Secondary | ICD-10-CM

## 2021-11-03 DIAGNOSIS — L02619 Cutaneous abscess of unspecified foot: Secondary | ICD-10-CM

## 2021-11-03 DIAGNOSIS — L97524 Non-pressure chronic ulcer of other part of left foot with necrosis of bone: Secondary | ICD-10-CM

## 2021-11-03 DIAGNOSIS — E1142 Type 2 diabetes mellitus with diabetic polyneuropathy: Secondary | ICD-10-CM

## 2021-11-03 DIAGNOSIS — E11621 Type 2 diabetes mellitus with foot ulcer: Secondary | ICD-10-CM

## 2021-11-03 DIAGNOSIS — M216X2 Other acquired deformities of left foot: Secondary | ICD-10-CM

## 2021-11-03 NOTE — Progress Notes (Signed)
Subjective: [name] is a 71 y.o. male patient seen today in office for POV #1.  Date of most recent surgery 10/16/2021 irrigation debridement with bone biopsy of left heel.  Patient was admitted to to hospital last month and underwent multiple debridement and amputation procedure of the left foot he was found secondarily to have continued infection in the left heel and went back for repeat surgery as above.  Patient reports that he is now home has home nursing assisting with wound VAC changes to the left medial heel and is getting daily PICC line antibiotics.  Patient states that he feels pretty good denies nausea vomiting fever chills or any other constitutional symptoms at this time.  Fasting blood sugar not recorded last A1c 7.2  Reports that he saw PCP on yesterday.  Patient is assisted by wife this visit.  Patient Active Problem List   Diagnosis Date Noted   Hyperkalemia 10/28/2021   Hypoalbuminemia due to protein-calorie malnutrition ([place]) 10/28/2021   Dehydration 10/28/2021   Elevated alkaline phosphatase level 10/28/2021   Acute osteomyelitis of left foot ([place]) 10/28/2021   Diabetic neuropathy ([place]) 10/28/2021   Acute kidney injury ([place]) 10/27/2021   Malnutrition of moderate degree 10/18/2021   Sepsis ([place]) 10/10/2021   Gas gangrene of foot ([place]) 10/10/2021   Acute kidney injury superimposed on CKD ([place]) 10/10/2021   Anemia in chronic kidney disease 05/27/2020   Secondary hyperparathyroidism of renal origin ([place]) 02/25/2020   Diabetic foot ulcer ([place]) 10/07/2019   Benign hypertensive kidney disease with chronic kidney disease 06/17/2019   Proteinuria 06/17/2019   History of amputation of lesser toe of left foot ([place]) 08/18/2015   Hyperlipidemia, mixed 08/18/2015   Type 2 diabetes mellitus with peripheral neuropathy ([place]) 08/18/2015   Acute CVA (cerebrovascular accident) ([name]) 08/09/2015   Essential hypertriglyceridemia 08/09/2015   Hypertension, essential 08/09/2015    Right-sided muscle weakness 08/09/2015   T2DM (type 2 diabetes mellitus) ([place]) 08/09/2015   Tobacco abuse 08/09/2015   CKD (chronic kidney disease) stage 4, GFR 15-29 ml/min ([place]) 04/17/2013   DM2 (diabetes mellitus, type 2) ([place]) 04/17/2013   HTN (hypertension) 04/17/2013   Osteomyelitis of toe of left foot ([place]) 04/17/2013    Current Outpatient Medications on File Prior to Visit  Medication Sig Dispense Refill   amLODipine (NORVASC) 10 MG tablet Take 10 mg by mouth daily.      amoxicillin-clavulanate (AUGMENTIN) 500-125 MG tablet Take 1 tablet (500 mg total) by mouth every 12 (twelve) hours. 80 tablet 0   aspirin EC 81 MG tablet Take 81 mg by mouth daily. Swallow whole.     atorvastatin (LIPITOR) 80 MG tablet Take 1 tablet by mouth every evening.     Blood Glucose Monitoring Suppl (TRUE METRIX METER) w/Device KIT   0   docusate sodium (COLACE) 100 MG capsule Take 200 mg by mouth daily.     doxycycline (VIBRA-TABS) 100 MG tablet Take 1 tablet (100 mg total) by mouth every 12 (twelve) hours. 80 tablet 0   Dulaglutide 4.5 MG/0.5ML SOPN Inject 4.5 mg into the skin every Wednesday.     glucose blood test strip daily.     HYDROcodone-acetaminophen (NORCO/VICODIN) 5-325 MG tablet Take 1 tablet by mouth every 6 (six) hours as needed for moderate pain. 20 tablet 0   Lancets (ONETOUCH ULTRASOFT) lancets daily.     NOVOLOG MIX 70/30 FLEXPEN (70-30) 100 UNIT/ML FlexPen Inject 32 Units into the skin 2 (two) times daily with a meal.     pregabalin (  LYRICA) 75 MG capsule Take 75 mg by mouth 2 (two) times daily.     sildenafil (VIAGRA) 50 MG tablet Take 50 mg by mouth as needed for erectile dysfunction.     sodium zirconium cyclosilicate (LOKELMA) 5 g packet Take 5 g by mouth daily. 30 packet 0   torsemide (DEMADEX) 20 MG tablet Take 20 mg by mouth daily.     ULTICARE MINI PEN NEEDLES 31G X 6 MM MISC   0   UNABLE TO FIND daily.     No current facility-administered medications on file prior to visit.     No Known Allergies  Objective: There were no vitals filed for this visit.  General: No acute distress, AAOx3  Left foot: Sutures and staples intact with no gapping or dehiscence at surgical site at the medial foot amputation stump site, there is a full-thickness wound that measures 3 x 2.5 x 1 cm at the left medial heel with fiber granular tissue in the wound bed and mild surrounding periwound maceration after the wound VAC was removed, there is no malodor, no active drainage, no other acute findings, mild swelling to left foot, no pain with calf compression.    Assessment and Plan:  Problem List Items Addressed This Visit       Endocrine   Type 2 diabetes mellitus with peripheral neuropathy (HCC)   Diabetic foot ulcer ([place]) - Primary   Other Visit Diagnoses     Left foot pain       Cellulitis and abscess of foot, except toes       Prominent metatarsal head of left foot       Surgery follow-up            -Patient seen and evaluated -Applied Betadine wet-to-dry dressing to the left foot -Home nursing to continue with wound VAC dressing changes on a schedule of Monday Wednesday Friday they should reapply the [facility] on tomorrow since patient did not bring any VAC supplies for me to replace at this visit -Continue with antibiotics as directed by infectious disease -Continue with limited protective weightbearing with boot and rolling walker -Will plan for x-rays and suture removal at next office visit. In the meantime, patient to call office if any issues or problems arise.   [name], DPM

## 2021-11-15 DIAGNOSIS — R809 Proteinuria, unspecified: Secondary | ICD-10-CM | POA: Diagnosis not present

## 2021-11-15 DIAGNOSIS — D631 Anemia in chronic kidney disease: Secondary | ICD-10-CM | POA: Diagnosis not present

## 2021-11-15 DIAGNOSIS — E1122 Type 2 diabetes mellitus with diabetic chronic kidney disease: Secondary | ICD-10-CM | POA: Diagnosis not present

## 2021-11-15 DIAGNOSIS — N2581 Secondary hyperparathyroidism of renal origin: Secondary | ICD-10-CM | POA: Diagnosis not present

## 2021-11-15 DIAGNOSIS — N184 Chronic kidney disease, stage 4 (severe): Secondary | ICD-10-CM | POA: Diagnosis not present

## 2021-11-15 DIAGNOSIS — I129 Hypertensive chronic kidney disease with stage 1 through stage 4 chronic kidney disease, or unspecified chronic kidney disease: Secondary | ICD-10-CM | POA: Diagnosis not present

## 2021-11-17 ENCOUNTER — Ambulatory Visit: Payer: Medicare HMO | Admitting: Sports Medicine

## 2021-11-17 ENCOUNTER — Other Ambulatory Visit: Payer: Self-pay

## 2021-11-17 ENCOUNTER — Encounter: Payer: Self-pay | Admitting: Sports Medicine

## 2021-11-17 DIAGNOSIS — L02619 Cutaneous abscess of unspecified foot: Secondary | ICD-10-CM

## 2021-11-17 DIAGNOSIS — E1122 Type 2 diabetes mellitus with diabetic chronic kidney disease: Secondary | ICD-10-CM | POA: Diagnosis not present

## 2021-11-17 DIAGNOSIS — N184 Chronic kidney disease, stage 4 (severe): Secondary | ICD-10-CM | POA: Diagnosis not present

## 2021-11-17 DIAGNOSIS — N2581 Secondary hyperparathyroidism of renal origin: Secondary | ICD-10-CM | POA: Diagnosis not present

## 2021-11-17 DIAGNOSIS — L97524 Non-pressure chronic ulcer of other part of left foot with necrosis of bone: Secondary | ICD-10-CM

## 2021-11-17 DIAGNOSIS — R809 Proteinuria, unspecified: Secondary | ICD-10-CM | POA: Diagnosis not present

## 2021-11-17 DIAGNOSIS — E11621 Type 2 diabetes mellitus with foot ulcer: Secondary | ICD-10-CM

## 2021-11-17 DIAGNOSIS — E1142 Type 2 diabetes mellitus with diabetic polyneuropathy: Secondary | ICD-10-CM

## 2021-11-17 DIAGNOSIS — I129 Hypertensive chronic kidney disease with stage 1 through stage 4 chronic kidney disease, or unspecified chronic kidney disease: Secondary | ICD-10-CM | POA: Diagnosis not present

## 2021-11-17 DIAGNOSIS — D631 Anemia in chronic kidney disease: Secondary | ICD-10-CM | POA: Diagnosis not present

## 2021-11-17 DIAGNOSIS — M79672 Pain in left foot: Secondary | ICD-10-CM

## 2021-11-17 DIAGNOSIS — L03119 Cellulitis of unspecified part of limb: Secondary | ICD-10-CM

## 2021-11-17 DIAGNOSIS — Z09 Encounter for follow-up examination after completed treatment for conditions other than malignant neoplasm: Secondary | ICD-10-CM

## 2021-11-17 NOTE — Progress Notes (Signed)
Subjective: [name] is a 71 y.o. male patient seen today in office for POV #2.  Date of most recent surgery 10/16/2021 irrigation debridement with bone biopsy of left heel.  Patient has home nursing assisting with wound care changing the wound VAC 3 times per week and is currently followed by infectious disease on PICC line antibiotics.  Patient states that he feels pretty good denies pain, nausea vomiting fever chills or any other constitutional symptoms at this time.  Fasting blood sugar not recorded last A1c 7.0  Reports that he saw PCP 2 weeks ago and also saw nephrologist since last visit.  Patient is assisted by wife this visit.  Patient Active Problem List   Diagnosis Date Noted   Hyperkalemia 10/28/2021   Hypoalbuminemia due to protein-calorie malnutrition ([place]) 10/28/2021   Dehydration 10/28/2021   Elevated alkaline phosphatase level 10/28/2021   Acute osteomyelitis of left foot ([place]) 10/28/2021   Diabetic neuropathy ([place]) 10/28/2021   Acute kidney injury ([place]) 10/27/2021   Malnutrition of moderate degree 10/18/2021   Sepsis ([place]) 10/10/2021   Gas gangrene of foot ([place]) 10/10/2021   Acute kidney injury superimposed on CKD ([place]) 10/10/2021   Anemia in chronic kidney disease 05/27/2020   Secondary hyperparathyroidism of renal origin ([place]) 02/25/2020   Diabetic foot ulcer ([place]) 10/07/2019   Benign hypertensive kidney disease with chronic kidney disease 06/17/2019   Proteinuria 06/17/2019   History of amputation of lesser toe of left foot ([place]) 08/18/2015   Hyperlipidemia, mixed 08/18/2015   Type 2 diabetes mellitus with peripheral neuropathy ([place]) 08/18/2015   Acute CVA (cerebrovascular accident) ([place]) 08/09/2015   Essential hypertriglyceridemia 08/09/2015   Hypertension, essential 08/09/2015   Right-sided muscle weakness 08/09/2015   T2DM (type 2 diabetes mellitus) ([place]) 08/09/2015   Tobacco abuse 08/09/2015   CKD (chronic kidney disease) stage 4, GFR 15-29 ml/min  ([place]) 04/17/2013   DM2 (diabetes mellitus, type 2) ([place]) 04/17/2013   HTN (hypertension) 04/17/2013   Osteomyelitis of toe of left foot ([place]) 04/17/2013    Current Outpatient Medications on File Prior to Visit  Medication Sig Dispense Refill   amLODipine (NORVASC) 10 MG tablet Take 10 mg by mouth daily.      amoxicillin-clavulanate (AUGMENTIN) 500-125 MG tablet Take 1 tablet (500 mg total) by mouth every 12 (twelve) hours. 80 tablet 0   aspirin EC 81 MG tablet Take 81 mg by mouth daily. Swallow whole.     atorvastatin (LIPITOR) 80 MG tablet Take 1 tablet by mouth every evening.     Blood Glucose Monitoring Suppl (TRUE METRIX METER) w/Device KIT   0   docusate sodium (COLACE) 100 MG capsule Take 200 mg by mouth daily.     doxycycline (VIBRA-TABS) 100 MG tablet Take 1 tablet (100 mg total) by mouth every 12 (twelve) hours. 80 tablet 0   Dulaglutide 4.5 MG/0.5ML SOPN Inject 4.5 mg into the skin every Wednesday.     glucose blood test strip daily.     HYDROcodone-acetaminophen (NORCO/VICODIN) 5-325 MG tablet Take 1 tablet by mouth every 6 (six) hours as needed for moderate pain. 20 tablet 0   Lancets (ONETOUCH ULTRASOFT) lancets daily.     NOVOLOG MIX 70/30 FLEXPEN (70-30) 100 UNIT/ML FlexPen Inject 32 Units into the skin 2 (two) times daily with a meal.     pregabalin (LYRICA) 75 MG capsule Take 75 mg by mouth 2 (two) times daily.     sildenafil (VIAGRA) 50 MG tablet Take 50 mg by mouth as needed for erectile  dysfunction.     sodium zirconium cyclosilicate (LOKELMA) 5 g packet Take 5 g by mouth daily. 30 packet 0   torsemide (DEMADEX) 20 MG tablet Take 20 mg by mouth daily.     ULTICARE MINI PEN NEEDLES 31G X 6 MM MISC   0   UNABLE TO FIND daily.     No current facility-administered medications on file prior to visit.    No Known Allergies  Objective: There were no vitals filed for this visit.  General: No acute distress, AAOx3  Left foot: Sutures and staples intact with no gapping  or dehiscence at surgical site at the medial foot amputation stump site/first ray, there is a full-thickness wound that measures 3.4 x 2.0 x 1.5 cm at the left medial heel with fibrogranular tissue in the wound bed and mild surrounding periwound maceration after the wound VAC was removed, as noted again at today's visit, there is no malodor, no active drainage, no other acute findings, mild swelling to left forefoot no swelling at the heel, no pain with calf compression.    Assessment and Plan:  Problem List Items Addressed This Visit       Endocrine   Type 2 diabetes mellitus with peripheral neuropathy (HCC)   Diabetic foot ulcer ([place]) - Primary   Other Visit Diagnoses     Cellulitis and abscess of foot, except toes       Left foot pain       Surgery follow-up            -Patient seen and evaluated -Wound VAC removed -Sutures were removed however staples were left intact to give additional time for surgical site healing at the first ray -Applied Betadine wet-to-dry dressing to the left foot -Home nursing to continue with wound VAC dressing changes on a schedule of Monday Wednesday Friday they should reapply the [facility] on tomorrow since patient did not bring any VAC supplies for me to replace at this visit as previously noted -Continue with antibiotics as directed by infectious disease -Continue with limited protective weightbearing with boot and rolling walker -Will plan for x-rays and staple removal at next office visit. In the meantime, patient to call office if any issues or problems arise.   [name], DPM

## 2021-11-29 DIAGNOSIS — N184 Chronic kidney disease, stage 4 (severe): Secondary | ICD-10-CM | POA: Diagnosis not present

## 2021-12-01 ENCOUNTER — Ambulatory Visit: Payer: Medicare HMO | Admitting: Sports Medicine

## 2021-12-01 ENCOUNTER — Other Ambulatory Visit: Payer: Self-pay

## 2021-12-01 ENCOUNTER — Ambulatory Visit (INDEPENDENT_AMBULATORY_CARE_PROVIDER_SITE_OTHER): Payer: Medicare HMO

## 2021-12-01 DIAGNOSIS — L03119 Cellulitis of unspecified part of limb: Secondary | ICD-10-CM

## 2021-12-01 DIAGNOSIS — M79672 Pain in left foot: Secondary | ICD-10-CM

## 2021-12-01 DIAGNOSIS — L97524 Non-pressure chronic ulcer of other part of left foot with necrosis of bone: Secondary | ICD-10-CM

## 2021-12-01 DIAGNOSIS — E1142 Type 2 diabetes mellitus with diabetic polyneuropathy: Secondary | ICD-10-CM

## 2021-12-01 DIAGNOSIS — L02619 Cutaneous abscess of unspecified foot: Secondary | ICD-10-CM

## 2021-12-01 DIAGNOSIS — M86172 Other acute osteomyelitis, left ankle and foot: Secondary | ICD-10-CM

## 2021-12-01 DIAGNOSIS — Z09 Encounter for follow-up examination after completed treatment for conditions other than malignant neoplasm: Secondary | ICD-10-CM

## 2021-12-01 DIAGNOSIS — E11621 Type 2 diabetes mellitus with foot ulcer: Secondary | ICD-10-CM

## 2021-12-01 NOTE — Patient Instructions (Signed)
Home Nursing Wound Care Orders ? ?Take a break for 1 week from the wound vac, Resume once maceration has improved on the heel wound on the left. Apply betadine to the periwound and calcium alginate and dry dressing 3x per week and then on next week re-assess to place wound vac. ? ?Any questions call office [phone] office  ? ?Thanks ?Dr. Cannon Kettle ?

## 2021-12-01 NOTE — Progress Notes (Signed)
Subjective: ?[name] is a 71 y.o. male patient seen today in office for POV #3.  Date of most recent surgery 10/16/2021 irrigation debridement with bone biopsy of left heel.  Patient has home nursing assisting with wound care changing the wound VAC 3 times per week and is currently followed by infectious disease on PICC line antibiotics has appointment on tomorrow.  Patient states that he feels fine and is hoping for good news. ? ?Fasting blood sugar not recorded last A1c 7.0, unchanged from prior. ? ?Patient Active Problem List  ? Diagnosis Date Noted  ? Hyperkalemia 10/28/2021  ? Hypoalbuminemia due to protein-calorie malnutrition ([place]) 10/28/2021  ? Dehydration 10/28/2021  ? Elevated alkaline phosphatase level 10/28/2021  ? Acute osteomyelitis of left foot ([place]) 10/28/2021  ? Diabetic neuropathy ([place]) 10/28/2021  ? Acute kidney injury ([place]) 10/27/2021  ? Malnutrition of moderate degree 10/18/2021  ? Sepsis ([place]) 10/10/2021  ? Gas gangrene of foot ([place]) 10/10/2021  ? Acute kidney injury superimposed on CKD ([place]) 10/10/2021  ? Anemia in chronic kidney disease 05/27/2020  ? Secondary hyperparathyroidism of renal origin ([place]) 02/25/2020  ? Diabetic foot ulcer ([place]) 10/07/2019  ? Benign hypertensive kidney disease with chronic kidney disease 06/17/2019  ? Proteinuria 06/17/2019  ? History of amputation of lesser toe of left foot ([place]) 08/18/2015  ? Hyperlipidemia, mixed 08/18/2015  ? Type 2 diabetes mellitus with peripheral neuropathy ([place]) 08/18/2015  ? Acute CVA (cerebrovascular accident) ([place]) 08/09/2015  ? Essential hypertriglyceridemia 08/09/2015  ? Hypertension, essential 08/09/2015  ? Right-sided muscle weakness 08/09/2015  ? T2DM (type 2 diabetes mellitus) ([place]) 08/09/2015  ? Tobacco abuse 08/09/2015  ? CKD (chronic kidney disease) stage 4, GFR 15-29 ml/min (HCC) 04/17/2013  ? DM2 (diabetes mellitus, type 2) ([place]) 04/17/2013  ? HTN (hypertension) 04/17/2013  ? Osteomyelitis of toe of left foot ([place])  04/17/2013  ? ? ?Current Outpatient Medications on File Prior to Visit  ?Medication Sig Dispense Refill  ? amLODipine (NORVASC) 10 MG tablet Take 10 mg by mouth daily.     ? aspirin EC 81 MG tablet Take 81 mg by mouth daily. Swallow whole.    ? atorvastatin (LIPITOR) 80 MG tablet Take 1 tablet by mouth every evening.    ? Blood Glucose Monitoring Suppl (TRUE METRIX METER) w/Device KIT   0  ? docusate sodium (COLACE) 100 MG capsule Take 200 mg by mouth daily.    ? Dulaglutide 4.5 MG/0.5ML SOPN Inject 4.5 mg into the skin every Wednesday.    ? glucose blood test strip daily.    ? HYDROcodone-acetaminophen (NORCO/VICODIN) 5-325 MG tablet Take 1 tablet by mouth every 6 (six) hours as needed for moderate pain. 20 tablet 0  ? Lancets (ONETOUCH ULTRASOFT) lancets daily.    ? NOVOLOG MIX 70/30 FLEXPEN (70-30) 100 UNIT/ML FlexPen Inject 32 Units into the skin 2 (two) times daily with a meal.    ? pregabalin (LYRICA) 75 MG capsule Take 75 mg by mouth 2 (two) times daily.    ? sildenafil (VIAGRA) 50 MG tablet Take 50 mg by mouth as needed for erectile dysfunction.    ? torsemide (DEMADEX) 20 MG tablet Take 20 mg by mouth daily.    ? ULTICARE MINI PEN NEEDLES 31G X 6 MM MISC   0  ? UNABLE TO FIND daily.    ? ?No current facility-administered medications on file prior to visit.  ? ? ?No Known Allergies ? ?Objective: ?There were no vitals filed for this visit. ? ?General: No  acute distress, AAOx3  ?Left foot: Staples intact with no gapping or dehiscence at surgical site at the medial foot amputation stump site/first ray, there is a full-thickness wound that measures 3.4 x 2.0 x 1.3 cm at the left medial heel with fibrogranular tissue in the wound bed and mild surrounding periwound maceration after the wound VAC was removed, as noted again at today's visit, there is no malodor, no active drainage, no other acute findings, mild swelling to left forefoot no swelling at the heel, no pain with calf compression.  ? ?Picture obtained  this visit.  ?Assessment and Plan:  ?Problem List Items Addressed This Visit   ? ?  ? Endocrine  ? Type 2 diabetes mellitus with peripheral neuropathy (HCC)  ? Diabetic foot ulcer ([place])  ?  ? Musculoskeletal and Integument  ? Acute osteomyelitis of left foot ([place])  ? ?Other Visit Diagnoses   ? ? Surgery follow-up    -  Primary  ? Relevant Orders  ? DG Foot Complete Left (Completed)  ? Cellulitis and abscess of foot, except toes      ? Left foot pain      ? ?  ?  ?-Patient seen and evaluated ?-Xrays consistent with post op status no new areas of osteomyelitis or boney erosions noted ?-Wound VAC removed ?-Staples were removed at left first ray surgical site ?-Applied Betadine wet-to-dry dressing to the left foot ?-Home nursing to pause with wound VAC dressing changes to betadine and alginate on a schedule of Monday Wednesday Friday and consider reapplication next week ?-Continue with antibiotics as directed by infectious disease ?-Continue with limited protective weightbearing with boot and rolling walker ?-Will plan for wound care at next office visit. In the meantime, patient to call office if any issues or problems arise.  ? ?[name], DPM  ?

## 2021-12-02 ENCOUNTER — Ambulatory Visit: Payer: Medicare HMO | Admitting: Internal Medicine

## 2021-12-02 ENCOUNTER — Other Ambulatory Visit: Payer: Self-pay

## 2021-12-02 ENCOUNTER — Encounter: Payer: Self-pay | Admitting: Internal Medicine

## 2021-12-02 VITALS — BP 147/73 | HR 103 | Temp 97.6°F | Wt 196.0 lb

## 2021-12-02 DIAGNOSIS — M868X7 Other osteomyelitis, ankle and foot: Secondary | ICD-10-CM | POA: Diagnosis not present

## 2021-12-02 NOTE — Progress Notes (Signed)
? ?   ? ? ? ? ?Patient Active Problem List  ? Diagnosis Date Noted  ? Hyperkalemia 10/28/2021  ? Hypoalbuminemia due to protein-calorie malnutrition ([place]) 10/28/2021  ? Dehydration 10/28/2021  ? Elevated alkaline phosphatase level 10/28/2021  ? Acute osteomyelitis of left foot ([place]) 10/28/2021  ? Diabetic neuropathy ([place]) 10/28/2021  ? Acute kidney injury ([place]) 10/27/2021  ? Malnutrition of moderate degree 10/18/2021  ? Sepsis ([place]) 10/10/2021  ? Gas gangrene of foot ([place]) 10/10/2021  ? Acute kidney injury superimposed on CKD ([place]) 10/10/2021  ? Anemia in chronic kidney disease 05/27/2020  ? Secondary hyperparathyroidism of renal origin ([place]) 02/25/2020  ? Diabetic foot ulcer ([place]) 10/07/2019  ? Benign hypertensive kidney disease with chronic kidney disease 06/17/2019  ? Proteinuria 06/17/2019  ? History of amputation of lesser toe of left foot ([place]) 08/18/2015  ? Hyperlipidemia, mixed 08/18/2015  ? Type 2 diabetes mellitus with peripheral neuropathy ([place]) 08/18/2015  ? Acute CVA (cerebrovascular accident) ([place]) 08/09/2015  ? Essential hypertriglyceridemia 08/09/2015  ? Hypertension, essential 08/09/2015  ? Right-sided muscle weakness 08/09/2015  ? T2DM (type 2 diabetes mellitus) ([place]) 08/09/2015  ? Tobacco abuse 08/09/2015  ? CKD (chronic kidney disease) stage 4, GFR 15-29 ml/min (HCC) 04/17/2013  ? DM2 (diabetes mellitus, type 2) ([place]) 04/17/2013  ? HTN (hypertension) 04/17/2013  ? Osteomyelitis of toe of left foot ([place]) 04/17/2013  ? ? ?Patient's Medications  ?New Prescriptions  ? No medications on file  ?Previous Medications  ? AMLODIPINE (NORVASC) 10 MG TABLET    Take 10 mg by mouth daily.   ? ASPIRIN EC 81 MG TABLET    Take 81 mg by mouth daily. Swallow whole.  ? ATORVASTATIN (LIPITOR) 80 MG TABLET    Take 1 tablet by mouth every evening.  ? BLOOD GLUCOSE MONITORING SUPPL (TRUE METRIX METER) W/DEVICE KIT      ? DOCUSATE SODIUM (COLACE) 100 MG CAPSULE    Take 200 mg by mouth daily.  ? DULAGLUTIDE 4.5 MG/0.5ML  SOPN    Inject 4.5 mg into the skin every Wednesday.  ? GLUCOSE BLOOD TEST STRIP    daily.  ? HYDROCODONE-ACETAMINOPHEN (NORCO/VICODIN) 5-325 MG TABLET    Take 1 tablet by mouth every 6 (six) hours as needed for moderate pain.  ? LANCETS (ONETOUCH ULTRASOFT) LANCETS    daily.  ? NOVOLOG MIX 70/30 FLEXPEN (70-30) 100 UNIT/ML FLEXPEN    Inject 32 Units into the skin 2 (two) times daily with a meal.  ? PREGABALIN (LYRICA) 75 MG CAPSULE    Take 75 mg by mouth 2 (two) times daily.  ? SILDENAFIL (VIAGRA) 50 MG TABLET    Take 50 mg by mouth as needed for erectile dysfunction.  ? TORSEMIDE (DEMADEX) 20 MG TABLET    Take 20 mg by mouth daily.  ? ULTICARE MINI PEN NEEDLES 31G X 6 MM MISC      ? UNABLE TO FIND    daily.  ?Modified Medications  ? No medications on file  ?Discontinued Medications  ? No medications on file  ? ? ?Subjective: ?47 YM with PMHx as below(including A1c 8.3), CKD stage IV presents for hospital follow-up of left foot osteomyelitis. He was admitted 1/9-1/19/23 to [facility] with worsening diabetic foot wound. He started on bactrim prior to admission. Taken to OR on 1/9 for I7D and 1st metatarsal resection on 1/11 OR Cx + strep anginosis, proteus mirabilis and Prevotella bivia. Started on linezolid(avoid nephrotoxicity of vancomycin) and pip-tazo. He continued to fever. ID  engaged. MRI left ankle showed abscess in hindfoot, calcaneal acute osteomyelitis, abscess in the proximal abductor hallucis. He underwent  debridement of left heel with bone Bx on 1/15. HE was transitioned to Augmentin and doxycyline to complete 6 weeks of antibiotics from OR 1/15 (EOT 11/26/21). ?10/26/21: He reports slight tremors that started a couple weeks priro to admission. He denies fever, chills, N/V/D. ?Interval: Admitted to [facility] 1/261/27 for hyperkalemia and AKI on CKD IV. Started on lokelma.  ?Today: Pt reprots wound vac is off. No new complaints.  ?Review of Systems: ?ROS ? ?Past Medical History:  ?Diagnosis Date  ? Chronic  kidney disease   ? stage 4-Cr 3.3 11-19-19  ? Diabetes mellitus without complication ([place])   ? Diabetic ulcer of foot associated with diabetes mellitus due to underlying condition, limited to breakdown of skin ([place])   ? left  ? GERD (gastroesophageal reflux disease)   ? Hyperlipidemia   ? Hypertension   ? Non compliance w medication regimen   ? Stroke [facility]) 2016  ? no deficits  ? ? ?Social History  ? ?Tobacco Use  ? Smoking status: Former  ? Smokeless tobacco: Never  ?Substance Use Topics  ? Alcohol use: Not Currently  ? Drug use: Never  ? ? ?No family history on file. ? ?No Known Allergies ? ?Health Maintenance  ?Topic Date Due  ? FOOT EXAM  Never done  ? OPHTHALMOLOGY EXAM  Never done  ? URINE MICROALBUMIN  Never done  ? Zoster Vaccines- Shingrix (1 of 2) Never done  ? Pneumonia Vaccine 47+ Years old (2 - PCV) 08/17/2016  ? COVID-19 Vaccine (4 - Booster for Moderna series) 11/09/2020  ? HEMOGLOBIN A1C  04/09/2022  ? COLONOSCOPY (Pts 45-88yr Insurance coverage will need to be confirmed)  08/10/2025  ? TETANUS/TDAP  08/17/2025  ? INFLUENZA VACCINE  Completed  ? Hepatitis C Screening  Completed  ? HPV VACCINES  Aged Out  ? ? ?Objective: ? ?There were no vitals filed for this visit. ?There is no height or weight on file to calculate BMI. ? ?Physical Exam ?Constitutional:   ?   General: He is not in acute distress. ?   Appearance: He is normal weight. He is not toxic-appearing.  ?HENT:  ?   Head: Normocephalic and atraumatic.  ?   Right Ear: External ear normal.  ?   Left Ear: External ear normal.  ?   Nose: No congestion or rhinorrhea.  ?   Mouth/Throat:  ?   Mouth: Mucous membranes are moist.  ?   Pharynx: Oropharynx is clear.  ?Eyes:  ?   Extraocular Movements: Extraocular movements intact.  ?   Conjunctiva/sclera: Conjunctivae normal.  ?   Pupils: Pupils are equal, round, and reactive to light.  ?Cardiovascular:  ?   Rate and Rhythm: Normal rate and regular rhythm.  ?   Heart sounds: No murmur heard. ?  No friction  rub. No gallop.  ?Pulmonary:  ?   Effort: Pulmonary effort is normal.  ?   Breath sounds: Normal breath sounds.  ?Abdominal:  ?   General: Abdomen is flat. Bowel sounds are normal.  ?   Palpations: Abdomen is soft.  ?Musculoskeletal:     ?   General: No swelling. Normal range of motion.  ?   Cervical back: Normal range of motion and neck supple.  ?Skin: ?   General: Skin is warm and dry.  ?Neurological:  ?   General: No focal deficit present.  ?  Mental Status: He is oriented to person, place, and time.  ?Psychiatric:     ?   Mood and Affect: Mood normal.  ? ? ?Lab Results ?Lab Results  ?Component Value Date  ? WBC 7.2 10/28/2021  ? HGB 10.6 (L) 10/28/2021  ? HCT 36.1 (L) 10/28/2021  ? MCV 93.5 10/28/2021  ? PLT 358 10/28/2021  ?  ?Lab Results  ?Component Value Date  ? CREATININE 4.47 (H) 10/28/2021  ? BUN 95 (H) 10/28/2021  ? NA 137 10/28/2021  ? K 4.8 10/28/2021  ? CL 105 10/28/2021  ? CO2 18 (L) 10/28/2021  ?  ?Lab Results  ?Component Value Date  ? ALT 33 10/28/2021  ? AST 22 10/28/2021  ? ALKPHOS 136 (H) 10/28/2021  ? BILITOT 0.3 10/28/2021  ?  ?No results found for: CHOL, HDL, LDLCALC, LDLDIRECT, TRIG, CHOLHDL ?No results found for: LABRPR, RPRTITER ?No results found for: HIV1RNAQUANT, HIV1RNAVL, CD4TABS ?  ?A/P ?#Left foot osteomyelitis SP I&D on 1/9 and 1st metatarsal resection on 1/11 ?#Calcaneal osteomyelitis and heel abscess SP I&D on 1/15 ?-OR Cx+ form 1/9+ strep anginosis, proteus mirabilis and Prevotella bivia ?-Bone Bx pathology on 1/15 showed osteomyelitis ?-Completed Augmentin and doxycyline tx6 weeks s from OR 1/15 (EOT 11/26/21). ?-Followed by podiatry and wound healing well. There was minimal drainage from heel wound. Will get imaging to assess.  ?Plan:  ?-MRI ankle and foot ?-Labs today cbc, cmp, esr crp ?-Follow-up in 1 month ? ? ?Laurice Record, MD ?[facility] ?[facility] ?12/02/2021, 11:38 AM  ?

## 2021-12-03 LAB — C-REACTIVE PROTEIN: CRP: 1.7 mg/L (ref <8.0)

## 2021-12-03 LAB — CBC WITH DIFFERENTIAL/PLATELET
Absolute Monocytes: 752 cells/uL (ref 200–950)
Basophils Absolute: 32 cells/uL (ref 0–200)
Basophils Relative: 0.4 %
Eosinophils Absolute: 192 cells/uL (ref 15–500)
Eosinophils Relative: 2.4 %
HCT: 33.2 % — ABNORMAL LOW (ref 38.5–50.0)
Hemoglobin: 10.5 g/dL — ABNORMAL LOW (ref 13.2–17.1)
Lymphs Abs: 1984 cells/uL (ref 850–3900)
MCH: 28.3 pg (ref 27.0–33.0)
MCHC: 31.6 g/dL — ABNORMAL LOW (ref 32.0–36.0)
MCV: 89.5 fL (ref 80.0–100.0)
MPV: 10.8 fL (ref 7.5–12.5)
Monocytes Relative: 9.4 %
Neutro Abs: 5040 cells/uL (ref 1500–7800)
Neutrophils Relative %: 63 %
Platelets: 196 10*3/uL (ref 140–400)
RBC: 3.71 10*6/uL — ABNORMAL LOW (ref 4.20–5.80)
RDW: 14.8 % (ref 11.0–15.0)
Total Lymphocyte: 24.8 %
WBC: 8 10*3/uL (ref 3.8–10.8)

## 2021-12-03 LAB — COMPLETE METABOLIC PANEL WITH GFR
AG Ratio: 0.9 (calc) — ABNORMAL LOW (ref 1.0–2.5)
ALT: 29 U/L (ref 9–46)
AST: 18 U/L (ref 10–35)
Albumin: 3.2 g/dL — ABNORMAL LOW (ref 3.6–5.1)
Alkaline phosphatase (APISO): 115 U/L (ref 35–144)
BUN/Creatinine Ratio: 15 (calc) (ref 6–22)
BUN: 52 mg/dL — ABNORMAL HIGH (ref 7–25)
CO2: 21 mmol/L (ref 20–32)
Calcium: 8.3 mg/dL — ABNORMAL LOW (ref 8.6–10.3)
Chloride: 108 mmol/L (ref 98–110)
Creat: 3.41 mg/dL — ABNORMAL HIGH (ref 0.70–1.35)
Globulin: 3.7 g/dL (calc) (ref 1.9–3.7)
Glucose, Bld: 215 mg/dL — ABNORMAL HIGH (ref 65–99)
Potassium: 5 mmol/L (ref 3.5–5.3)
Sodium: 140 mmol/L (ref 135–146)
Total Bilirubin: 0.3 mg/dL (ref 0.2–1.2)
Total Protein: 6.9 g/dL (ref 6.1–8.1)
eGFR: 19 mL/min/{1.73_m2} — ABNORMAL LOW (ref >=60)

## 2021-12-03 LAB — SEDIMENTATION RATE: Sed Rate: 53 mm/h — ABNORMAL HIGH (ref 0–20)

## 2021-12-12 ENCOUNTER — Telehealth: Payer: Self-pay

## 2021-12-12 NOTE — Telephone Encounter (Signed)
-----   Message from Laurice Record, MD sent at 12/12/2021  8:44 AM EDT ----- ?Stable labs, MR ankle pending ?

## 2021-12-12 NOTE — Telephone Encounter (Signed)
Called patient to relay results, no answer. Left HIPAA compliant voicemail requesting callback.   Trianna Lupien D Derik Fults, RN  

## 2021-12-14 NOTE — Telephone Encounter (Signed)
Called patient to relay results, no answer.   Drue Harr D Bailea Beed, RN  

## 2021-12-15 ENCOUNTER — Encounter: Payer: Self-pay | Admitting: Sports Medicine

## 2021-12-15 ENCOUNTER — Other Ambulatory Visit: Payer: Self-pay

## 2021-12-15 ENCOUNTER — Telehealth: Payer: Self-pay | Admitting: *Deleted

## 2021-12-15 ENCOUNTER — Ambulatory Visit: Payer: Medicare HMO | Admitting: Sports Medicine

## 2021-12-15 DIAGNOSIS — L97524 Non-pressure chronic ulcer of other part of left foot with necrosis of bone: Secondary | ICD-10-CM

## 2021-12-15 DIAGNOSIS — E11621 Type 2 diabetes mellitus with foot ulcer: Secondary | ICD-10-CM | POA: Diagnosis not present

## 2021-12-15 DIAGNOSIS — Z09 Encounter for follow-up examination after completed treatment for conditions other than malignant neoplasm: Secondary | ICD-10-CM

## 2021-12-15 DIAGNOSIS — L03119 Cellulitis of unspecified part of limb: Secondary | ICD-10-CM

## 2021-12-15 DIAGNOSIS — E1142 Type 2 diabetes mellitus with diabetic polyneuropathy: Secondary | ICD-10-CM

## 2021-12-15 DIAGNOSIS — L02619 Cutaneous abscess of unspecified foot: Secondary | ICD-10-CM

## 2021-12-15 DIAGNOSIS — M79672 Pain in left foot: Secondary | ICD-10-CM

## 2021-12-15 NOTE — Telephone Encounter (Signed)
Patient returned missed call. Relayed message about lab results. Provided patient with MRI appt information. ?Leatrice Jewels, RMA ? ?

## 2021-12-15 NOTE — Patient Instructions (Signed)
Home nursing wound care instructions; reapply wound VAC on a schedule of Monday Wednesday Friday to the left heel wound at 125 mmHg continuous.  Patient will return to office in 2 weeks for reevaluation of the wound.  Wound was debrided at today's office visit 12/15/2021. ?Any questions contact my office [phone] ?Thanks Dr. Cannon Kettle ?

## 2021-12-15 NOTE — Progress Notes (Signed)
Subjective: ?[name] is a 71 y.o. male patient seen today in office for POV #4.  Date of most recent surgery 10/16/2021 irrigation debridement with bone biopsy of left heel.  Patient has home nursing assisting with wound care changing the wound VAC 3 times per week and is currently followed by infectious disease and reports they may repeat an MRI.  Patient states that he feels fine and denies nausea vomiting fever chills or any constitutional symptoms at this time. ? ?Fasting blood sugar not recorded last A1c 7.0, unchanged from prior. ? ?Patient Active Problem List  ? Diagnosis Date Noted  ? Hyperkalemia 10/28/2021  ? Hypoalbuminemia due to protein-calorie malnutrition ([place]) 10/28/2021  ? Dehydration 10/28/2021  ? Elevated alkaline phosphatase level 10/28/2021  ? Acute osteomyelitis of left foot ([place]) 10/28/2021  ? Diabetic neuropathy ([place]) 10/28/2021  ? Acute kidney injury ([place]) 10/27/2021  ? Malnutrition of moderate degree 10/18/2021  ? Sepsis ([place]) 10/10/2021  ? Gas gangrene of foot ([place]) 10/10/2021  ? Acute kidney injury superimposed on CKD ([place]) 10/10/2021  ? Anemia in chronic kidney disease 05/27/2020  ? Secondary hyperparathyroidism of renal origin ([place]) 02/25/2020  ? Diabetic foot ulcer ([place]) 10/07/2019  ? Benign hypertensive kidney disease with chronic kidney disease 06/17/2019  ? Proteinuria 06/17/2019  ? History of amputation of lesser toe of left foot ([place]) 08/18/2015  ? Hyperlipidemia, mixed 08/18/2015  ? Type 2 diabetes mellitus with peripheral neuropathy ([place]) 08/18/2015  ? Acute CVA (cerebrovascular accident) ([place]) 08/09/2015  ? Essential hypertriglyceridemia 08/09/2015  ? Hypertension, essential 08/09/2015  ? Right-sided muscle weakness 08/09/2015  ? T2DM (type 2 diabetes mellitus) ([place]) 08/09/2015  ? Tobacco abuse 08/09/2015  ? CKD (chronic kidney disease) stage 4, GFR 15-29 ml/min (HCC) 04/17/2013  ? DM2 (diabetes mellitus, type 2) ([place]) 04/17/2013  ? HTN (hypertension) 04/17/2013  ?  Osteomyelitis of toe of left foot ([place]) 04/17/2013  ? ? ?Current Outpatient Medications on File Prior to Visit  ?Medication Sig Dispense Refill  ? amLODipine (NORVASC) 10 MG tablet Take 10 mg by mouth daily.     ? aspirin EC 81 MG tablet Take 81 mg by mouth daily. Swallow whole.    ? atorvastatin (LIPITOR) 80 MG tablet Take 1 tablet by mouth every evening.    ? Blood Glucose Monitoring Suppl (TRUE METRIX METER) w/Device KIT   0  ? docusate sodium (COLACE) 100 MG capsule Take 200 mg by mouth daily.    ? Dulaglutide 4.5 MG/0.5ML SOPN Inject 4.5 mg into the skin every Wednesday.    ? glucose blood test strip daily.    ? HYDROcodone-acetaminophen (NORCO/VICODIN) 5-325 MG tablet Take 1 tablet by mouth every 6 (six) hours as needed for moderate pain. 20 tablet 0  ? Lancets (ONETOUCH ULTRASOFT) lancets daily.    ? NOVOLOG MIX 70/30 FLEXPEN (70-30) 100 UNIT/ML FlexPen Inject 32 Units into the skin 2 (two) times daily with a meal.    ? pregabalin (LYRICA) 75 MG capsule Take 75 mg by mouth 2 (two) times daily.    ? sildenafil (VIAGRA) 50 MG tablet Take 50 mg by mouth as needed for erectile dysfunction. (Patient not taking: Reported on 12/02/2021)    ? torsemide (DEMADEX) 20 MG tablet Take 20 mg by mouth daily.    ? ULTICARE MINI PEN NEEDLES 31G X 6 MM MISC   0  ? UNABLE TO FIND daily.    ? ?No current facility-administered medications on file prior to visit.  ? ? ?Allergies  ?Allergen Reactions  ?  Losartan Potassium   ?  Other reaction(s): hyperkalemia  ? Sildenafil   ?  Other reaction(s): not effective  ? ? ?Objective: ?There were no vitals filed for this visit. ? ?General: No acute distress, AAOx3  ?Left foot: First ray amputation site well-healed at the medial foot.  There is a full-thickness wound that measures 3.5x 2.0 x 0.8 cm at the left medial heel with fibrogranular tissue in the wound bed and mild surrounding periwound maceration after the wound was debrided all macerated tissue was resolved as pictured,  there is no  malodor, no active drainage, no other acute findings, mild swelling to left forefoot no swelling at the heel, no pain with calf compression. ? ? ?  ? ?Picture obtained this visit.  ?Assessment and Plan:  ?Problem List Items Addressed This Visit   ? ?  ? Endocrine  ? Type 2 diabetes mellitus with peripheral neuropathy (HCC)  ? Diabetic foot ulcer ([place]) - Primary  ? ?Other Visit Diagnoses   ? ? Cellulitis and abscess of foot, except toes      ? Surgery follow-up      ? Left foot pain      ? ?  ? ? ?  ?-Patient seen and evaluated ?-Wound VAC removed ?- Excisionally dedbrided ulceration at left medial heel to healthy bleeding borders removing nonviable tissue using a sterile chisel blade. Wound measures post debridement as above.  Wound was debrided to the level of the dermis with viable wound base exposed to promote healing. Hemostasis was achieved with manuel pressure. Patient tolerated procedure well without any discomfort or anesthesia necessary for this wound debridement.  ?-Applied calcium alginate and dry sterile dressing and instructed patient to continue with daily dressings at home with help from home nursing resuming on a schedule of Monday Wednesday Friday  ?-Antibiotics completed as followed by infectious disease ?-Continue with limited protective weightbearing with boot and rolling walker ?-Will plan for continued wound care at next office visit. In the meantime, patient to call office if any issues or problems arise.  ? ?[name], DPM  ?

## 2021-12-15 NOTE — Telephone Encounter (Signed)
Patient is calling because he said that his wife was giving him the antibiotic pills once daily. What should he do at this point, keep taking once daily or resume twice starting today? Please advise. ?

## 2021-12-15 NOTE — Telephone Encounter (Signed)
Notified the patient, verbalized understanding

## 2021-12-19 ENCOUNTER — Ambulatory Visit (HOSPITAL_COMMUNITY): Admission: RE | Admit: 2021-12-19 | Payer: Medicare HMO | Source: Ambulatory Visit

## 2021-12-20 DIAGNOSIS — N184 Chronic kidney disease, stage 4 (severe): Secondary | ICD-10-CM | POA: Diagnosis not present

## 2021-12-20 DIAGNOSIS — I10 Essential (primary) hypertension: Secondary | ICD-10-CM | POA: Diagnosis not present

## 2021-12-20 DIAGNOSIS — R809 Proteinuria, unspecified: Secondary | ICD-10-CM | POA: Diagnosis not present

## 2021-12-20 DIAGNOSIS — E1122 Type 2 diabetes mellitus with diabetic chronic kidney disease: Secondary | ICD-10-CM | POA: Diagnosis not present

## 2021-12-20 DIAGNOSIS — E782 Mixed hyperlipidemia: Secondary | ICD-10-CM | POA: Diagnosis not present

## 2021-12-20 DIAGNOSIS — M869 Osteomyelitis, unspecified: Secondary | ICD-10-CM | POA: Diagnosis not present

## 2021-12-20 DIAGNOSIS — E875 Hyperkalemia: Secondary | ICD-10-CM | POA: Diagnosis not present

## 2021-12-20 DIAGNOSIS — D638 Anemia in other chronic diseases classified elsewhere: Secondary | ICD-10-CM | POA: Diagnosis not present

## 2021-12-29 ENCOUNTER — Encounter: Payer: Self-pay | Admitting: Sports Medicine

## 2021-12-29 ENCOUNTER — Ambulatory Visit: Payer: Medicare HMO | Admitting: Sports Medicine

## 2021-12-29 DIAGNOSIS — E11621 Type 2 diabetes mellitus with foot ulcer: Secondary | ICD-10-CM

## 2021-12-29 DIAGNOSIS — M79672 Pain in left foot: Secondary | ICD-10-CM

## 2021-12-29 DIAGNOSIS — L02619 Cutaneous abscess of unspecified foot: Secondary | ICD-10-CM

## 2021-12-29 DIAGNOSIS — L97524 Non-pressure chronic ulcer of other part of left foot with necrosis of bone: Secondary | ICD-10-CM

## 2021-12-29 DIAGNOSIS — Z09 Encounter for follow-up examination after completed treatment for conditions other than malignant neoplasm: Secondary | ICD-10-CM

## 2021-12-29 DIAGNOSIS — L03119 Cellulitis of unspecified part of limb: Secondary | ICD-10-CM

## 2021-12-29 DIAGNOSIS — M86172 Other acute osteomyelitis, left ankle and foot: Secondary | ICD-10-CM

## 2021-12-29 DIAGNOSIS — E1142 Type 2 diabetes mellitus with diabetic polyneuropathy: Secondary | ICD-10-CM

## 2021-12-29 NOTE — Patient Instructions (Signed)
Home nursing wound care instructions ?Discontinue the wound vac. Return to company.  ?Start using Calcium alginate to the left heel ulcer 3 times per week and dry dressing to the left foot ? ?Any questions ?Call Dr. Cannon Kettle ?[facility] ?[phone] office ? ?

## 2021-12-29 NOTE — Progress Notes (Signed)
Subjective: ?[name] is a 71 y.o. male patient seen today in office for POV #5 Date of most recent surgery 10/16/2021 irrigation debridement with bone biopsy of left heel.  Patient has home nursing assisting with wound care changing the wound VAC 3 times per week and is currently followed by infectious disease and reports they may repeat an MRI as scheduled on 4/7.  Patient states that he feels good no problems and denies nausea vomiting fever chills or any constitutional symptoms at this time. ? ?Fasting blood sugar not recorded last A1c 7.0, unchanged from prior. ? ?Patient Active Problem List  ? Diagnosis Date Noted  ? Hyperkalemia 10/28/2021  ? Hypoalbuminemia due to protein-calorie malnutrition ([place]) 10/28/2021  ? Dehydration 10/28/2021  ? Elevated alkaline phosphatase level 10/28/2021  ? Acute osteomyelitis of left foot ([place]) 10/28/2021  ? Diabetic neuropathy ([place]) 10/28/2021  ? Acute kidney injury ([place]) 10/27/2021  ? Malnutrition of moderate degree 10/18/2021  ? Sepsis ([place]) 10/10/2021  ? Gas gangrene of foot ([place]) 10/10/2021  ? Acute kidney injury superimposed on CKD ([place]) 10/10/2021  ? Anemia in chronic kidney disease 05/27/2020  ? Secondary hyperparathyroidism of renal origin ([place]) 02/25/2020  ? Diabetic foot ulcer ([place]) 10/07/2019  ? Benign hypertensive kidney disease with chronic kidney disease 06/17/2019  ? Proteinuria 06/17/2019  ? History of amputation of lesser toe of left foot ([place]) 08/18/2015  ? Hyperlipidemia, mixed 08/18/2015  ? Type 2 diabetes mellitus with peripheral neuropathy ([place]) 08/18/2015  ? Acute CVA (cerebrovascular accident) ([place]) 08/09/2015  ? Essential hypertriglyceridemia 08/09/2015  ? Hypertension, essential 08/09/2015  ? Right-sided muscle weakness 08/09/2015  ? T2DM (type 2 diabetes mellitus) ([place]) 08/09/2015  ? Tobacco abuse 08/09/2015  ? CKD (chronic kidney disease) stage 4, GFR 15-29 ml/min (HCC) 04/17/2013  ? DM2 (diabetes mellitus, type 2) ([place]) 04/17/2013  ? HTN  (hypertension) 04/17/2013  ? Osteomyelitis of toe of left foot ([place]) 04/17/2013  ? ? ?Current Outpatient Medications on File Prior to Visit  ?Medication Sig Dispense Refill  ? amLODipine (NORVASC) 10 MG tablet Take 10 mg by mouth daily.     ? aspirin EC 81 MG tablet Take 81 mg by mouth daily. Swallow whole.    ? atorvastatin (LIPITOR) 80 MG tablet Take 1 tablet by mouth every evening.    ? Blood Glucose Monitoring Suppl (TRUE METRIX METER) w/Device KIT   0  ? docusate sodium (COLACE) 100 MG capsule Take 200 mg by mouth daily.    ? Dulaglutide 4.5 MG/0.5ML SOPN Inject 4.5 mg into the skin every Wednesday.    ? glucose blood test strip daily.    ? HYDROcodone-acetaminophen (NORCO/VICODIN) 5-325 MG tablet Take 1 tablet by mouth every 6 (six) hours as needed for moderate pain. 20 tablet 0  ? Lancets (ONETOUCH ULTRASOFT) lancets daily.    ? NOVOLOG MIX 70/30 FLEXPEN (70-30) 100 UNIT/ML FlexPen Inject 32 Units into the skin 2 (two) times daily with a meal.    ? pregabalin (LYRICA) 75 MG capsule Take 75 mg by mouth 2 (two) times daily.    ? sildenafil (VIAGRA) 50 MG tablet Take 50 mg by mouth as needed for erectile dysfunction. (Patient not taking: Reported on 12/02/2021)    ? torsemide (DEMADEX) 20 MG tablet Take 20 mg by mouth daily.    ? ULTICARE MINI PEN NEEDLES 31G X 6 MM MISC   0  ? UNABLE TO FIND daily.    ? ?No current facility-administered medications on file prior to visit.  ? ? ?  Allergies  ?Allergen Reactions  ? Losartan Potassium   ?  Other reaction(s): hyperkalemia  ? Sildenafil   ?  Other reaction(s): not effective  ? ? ?Objective: ?There were no vitals filed for this visit. ? ?General: No acute distress, AAOx3  ?Left foot: First ray amputation site well-healed at the medial foot.  There is a full-thickness wound that measures 2.5x 2.0 x 0.6 cm at the left medial heel with fibrogranular tissue in the wound bed and mild surrounding periwound maceration after the wound was debrided all macerated tissue was almost  resolved as pictured there is no malodor, no active drainage, no other acute findings, mild swelling to left forefoot no swelling at the heel, no pain with calf compression. ? ? ? ?  ? ?Picture obtained this visit.  ?Assessment and Plan:  ?Problem List Items Addressed This Visit   ? ?  ? Endocrine  ? Type 2 diabetes mellitus with peripheral neuropathy (HCC)  ? Diabetic foot ulcer ([place]) - Primary  ?  ? Musculoskeletal and Integument  ? Acute osteomyelitis of left foot ([place])  ? ?Other Visit Diagnoses   ? ? Cellulitis and abscess of foot, except toes      ? Surgery follow-up      ? Left foot pain      ? ?  ? ? ?  ?-Patient seen and evaluated ?-Wound VAC removed ?- Excisionally dedbrided ulceration at left medial heel to healthy bleeding borders removing nonviable tissue using a sterile chisel blade. Wound measures post debridement as above.  Wound was debrided to the level of the dermis with viable wound base exposed to promote healing. Hemostasis was achieved with manuel pressure. Patient tolerated procedure well without any discomfort or anesthesia necessary for this wound debridement.  ?-Applied Maxorb calcium alginate and dry sterile dressing and instructed patient to continue with daily dressings at home with help from home nursing resuming on a schedule of Monday Wednesday Friday of the same may discontinue wound VAC at this time ?-Patient reports that he still has a few more tablets of antibiotic advised patient to complete antibiotics and continue with his follow-up as directed by infectious disease ?-Continue with limited protective weightbearing with Cam boot and rolling walker ?-Will plan for continued wound care at next office visit. In the meantime, patient to call office if any issues or problems arise.  ? ?[name], DPM  ?

## 2022-01-02 DIAGNOSIS — E1122 Type 2 diabetes mellitus with diabetic chronic kidney disease: Secondary | ICD-10-CM | POA: Diagnosis not present

## 2022-01-02 DIAGNOSIS — I10 Essential (primary) hypertension: Secondary | ICD-10-CM | POA: Diagnosis not present

## 2022-01-02 DIAGNOSIS — N184 Chronic kidney disease, stage 4 (severe): Secondary | ICD-10-CM | POA: Diagnosis not present

## 2022-01-02 DIAGNOSIS — N2581 Secondary hyperparathyroidism of renal origin: Secondary | ICD-10-CM | POA: Diagnosis not present

## 2022-01-06 ENCOUNTER — Ambulatory Visit (HOSPITAL_COMMUNITY)
Admission: RE | Admit: 2022-01-06 | Discharge: 2022-01-06 | Disposition: A | Discharge status: Home / Self Care | Payer: Medicare HMO | Source: Ambulatory Visit | Attending: Internal Medicine | Admitting: Internal Medicine

## 2022-01-06 DIAGNOSIS — M868X7 Other osteomyelitis, ankle and foot: Secondary | ICD-10-CM | POA: Diagnosis not present

## 2022-01-06 DIAGNOSIS — L98499 Non-pressure chronic ulcer of skin of other sites with unspecified severity: Secondary | ICD-10-CM | POA: Diagnosis not present

## 2022-01-06 DIAGNOSIS — M86672 Other chronic osteomyelitis, left ankle and foot: Secondary | ICD-10-CM | POA: Diagnosis not present

## 2022-01-06 DIAGNOSIS — S91002A Unspecified open wound, left ankle, initial encounter: Secondary | ICD-10-CM | POA: Diagnosis not present

## 2022-01-06 DIAGNOSIS — M86172 Other acute osteomyelitis, left ankle and foot: Secondary | ICD-10-CM | POA: Diagnosis not present

## 2022-01-11 ENCOUNTER — Other Ambulatory Visit: Payer: Self-pay

## 2022-01-11 ENCOUNTER — Ambulatory Visit (INDEPENDENT_AMBULATORY_CARE_PROVIDER_SITE_OTHER): Payer: Medicare HMO | Admitting: Internal Medicine

## 2022-01-11 VITALS — BP 136/77 | HR 99 | Resp 16 | Ht 72.0 in | Wt 195.0 lb

## 2022-01-11 DIAGNOSIS — M868X9 Other osteomyelitis, unspecified sites: Secondary | ICD-10-CM

## 2022-01-11 MED ORDER — AMOXICILLIN-POT CLAVULANATE 500-125 MG PO TABS: 1.0000 | ORAL_TABLET | Freq: Two times a day (BID) | ORAL | 0 refills | Status: AC

## 2022-01-11 MED ORDER — AMOXICILLIN-POT CLAVULANATE 500-125 MG PO TABS: ORAL_TABLET | ORAL | 0 refills | Status: DC

## 2022-01-11 MED ORDER — DOXYCYCLINE HYCLATE 100 MG PO TABS: 100.0000 mg | ORAL_TABLET | Freq: Two times a day (BID) | ORAL | 0 refills | Status: DC

## 2022-01-11 NOTE — Progress Notes (Addendum)
? ?   ? ? ? ? ?Patient Active Problem List  ? Diagnosis Date Noted  ? Hyperkalemia 10/28/2021  ? Hypoalbuminemia due to protein-calorie malnutrition ([place]) 10/28/2021  ? Dehydration 10/28/2021  ? Elevated alkaline phosphatase level 10/28/2021  ? Acute osteomyelitis of left foot ([place]) 10/28/2021  ? Diabetic neuropathy ([place]) 10/28/2021  ? Acute kidney injury ([place]) 10/27/2021  ? Malnutrition of moderate degree 10/18/2021  ? Sepsis ([place]) 10/10/2021  ? Gas gangrene of foot ([place]) 10/10/2021  ? Acute kidney injury superimposed on CKD ([place]) 10/10/2021  ? Anemia in chronic kidney disease 05/27/2020  ? Secondary hyperparathyroidism of renal origin ([place]) 02/25/2020  ? Diabetic foot ulcer ([place]) 10/07/2019  ? Benign hypertensive kidney disease with chronic kidney disease 06/17/2019  ? Proteinuria 06/17/2019  ? History of amputation of lesser toe of left foot ([place]) 08/18/2015  ? Hyperlipidemia, mixed 08/18/2015  ? Type 2 diabetes mellitus with peripheral neuropathy ([place]) 08/18/2015  ? Acute CVA (cerebrovascular accident) ([place]) 08/09/2015  ? Essential hypertriglyceridemia 08/09/2015  ? Hypertension, essential 08/09/2015  ? Right-sided muscle weakness 08/09/2015  ? T2DM (type 2 diabetes mellitus) ([name]) 08/09/2015  ? Tobacco abuse 08/09/2015  ? CKD (chronic kidney disease) stage 4, GFR 15-29 ml/min (HCC) 04/17/2013  ? DM2 (diabetes mellitus, type 2) ([place]) 04/17/2013  ? HTN (hypertension) 04/17/2013  ? Osteomyelitis of toe of left foot ([place]) 04/17/2013  ? ? ?Patient's Medications  ?New Prescriptions  ? No medications on file  ?Previous Medications  ? AMLODIPINE (NORVASC) 10 MG TABLET    Take 10 mg by mouth daily.   ? ASPIRIN EC 81 MG TABLET    Take 81 mg by mouth daily. Swallow whole.  ? ATORVASTATIN (LIPITOR) 80 MG TABLET    Take 1 tablet by mouth every evening.  ? BLOOD GLUCOSE MONITORING SUPPL (TRUE METRIX METER) W/DEVICE KIT      ? DOCUSATE SODIUM (COLACE) 100 MG CAPSULE    Take 200 mg by mouth daily.  ? DULAGLUTIDE 4.5 MG/0.5ML  SOPN    Inject 4.5 mg into the skin every Wednesday.  ? GLUCOSE BLOOD TEST STRIP    daily.  ? HYDROCODONE-ACETAMINOPHEN (NORCO/VICODIN) 5-325 MG TABLET    Take 1 tablet by mouth every 6 (six) hours as needed for moderate pain.  ? LANCETS (ONETOUCH ULTRASOFT) LANCETS    daily.  ? NOVOLOG MIX 70/30 FLEXPEN (70-30) 100 UNIT/ML FLEXPEN    Inject 32 Units into the skin 2 (two) times daily with a meal.  ? PREGABALIN (LYRICA) 75 MG CAPSULE    Take 75 mg by mouth 2 (two) times daily.  ? SILDENAFIL (VIAGRA) 50 MG TABLET    Take 50 mg by mouth as needed for erectile dysfunction.  ? TORSEMIDE (DEMADEX) 20 MG TABLET    Take 20 mg by mouth daily.  ? ULTICARE MINI PEN NEEDLES 31G X 6 MM MISC      ? UNABLE TO FIND    daily.  ?Modified Medications  ? No medications on file  ?Discontinued Medications  ? No medications on file  ? ? ?Subjective: ?39 YM with PMHx as below(including A1c 8.3), CKD stage IV presents for hospital follow-up of left foot osteomyelitis. He was admitted 1/9-1/19/23 to [facility] with worsening diabetic foot wound. He started on bactrim prior to admission. Taken to OR on 1/9 for I7D and 1st metatarsal resection on 1/11 OR Cx + strep anginosis, proteus mirabilis and Prevotella bivia. Started on linezolid(avoid nephrotoxicity of vancomycin) and pip-tazo. He continued to fever. ID  engaged. MRI left ankle showed abscess in hindfoot, calcaneal acute osteomyelitis, abscess in the proximal abductor hallucis. He underwent  debridement of left heel with bone Bx on 1/15. HE was transitioned to Augmentin and doxycyline to complete 6 weeks of antibiotics from OR 1/15 (EOT 11/26/21). ?10/26/21: He reports slight tremors that started a couple weeks priro to admission. He denies fever, chills, N/V/D. ?Interval: Admitted to [facility] 1/261/27 for hyperkalemia and AKI on CKD IV. Started on lokelma.  ?12/02/21 Pt reprots wound vac is off. No new complaints.  ?Interim: on 3/30 left heel wound debrided by podiatry. 01/06/22 MR left ankle  showed acute osteo of plantar medial aspect of calcaneus.  ?01/11/22: Pt reports he is still taking antibiotics. Instead of taking Augmentin and doxycyline twice a day, he was taking it once a day.  ? ?Review of Systems: ?Review of Systems  ?All other systems reviewed and are negative. ? ?Past Medical History:  ?Diagnosis Date  ? Chronic kidney disease   ? stage 4-Cr 3.3 11-19-19  ? Diabetes mellitus without complication ([place])   ? Diabetic ulcer of foot associated with diabetes mellitus due to underlying condition, limited to breakdown of skin ([place])   ? left  ? GERD (gastroesophageal reflux disease)   ? Hyperlipidemia   ? Hypertension   ? Non compliance w medication regimen   ? Stroke [facility]) 2016  ? no deficits  ? ? ?Social History  ? ?Tobacco Use  ? Smoking status: Former  ? Smokeless tobacco: Never  ?Substance Use Topics  ? Alcohol use: Not Currently  ? Drug use: Never  ? ? ?No family history on file. ? ?Allergies  ?Allergen Reactions  ? Losartan Potassium   ?  Other reaction(s): hyperkalemia  ? Sildenafil   ?  Other reaction(s): not effective  ? ? ?Health Maintenance  ?Topic Date Due  ? FOOT EXAM  Never done  ? OPHTHALMOLOGY EXAM  Never done  ? URINE MICROALBUMIN  Never done  ? Zoster Vaccines- Shingrix (1 of 2) Never done  ? Pneumonia Vaccine 55+ Years old (2 - PCV) 08/17/2016  ? COVID-19 Vaccine (4 - Booster for Moderna series) 11/09/2020  ? HEMOGLOBIN A1C  04/09/2022  ? INFLUENZA VACCINE  05/02/2022  ? COLONOSCOPY (Pts 45-73yr Insurance coverage will need to be confirmed)  08/10/2025  ? TETANUS/TDAP  08/17/2025  ? Hepatitis C Screening  Completed  ? HPV VACCINES  Aged Out  ? ? ?Objective: ? ?There were no vitals filed for this visit. ?There is no height or weight on file to calculate BMI. ? ?Physical Exam ?Constitutional:   ?   General: He is not in acute distress. ?   Appearance: He is normal weight. He is not toxic-appearing.  ?HENT:  ?   Head: Normocephalic and atraumatic.  ?   Right Ear: External ear normal.   ?   Left Ear: External ear normal.  ?   Nose: No congestion or rhinorrhea.  ?   Mouth/Throat:  ?   Mouth: Mucous membranes are moist.  ?   Pharynx: Oropharynx is clear.  ?Eyes:  ?   Extraocular Movements: Extraocular movements intact.  ?   Conjunctiva/sclera: Conjunctivae normal.  ?   Pupils: Pupils are equal, round, and reactive to light.  ?Cardiovascular:  ?   Rate and Rhythm: Normal rate and regular rhythm.  ?   Heart sounds: No murmur heard. ?  No friction rub. No gallop.  ?Pulmonary:  ?   Effort: Pulmonary effort is normal.  ?  Breath sounds: Normal breath sounds.  ?Abdominal:  ?   General: Abdomen is flat. Bowel sounds are normal.  ?   Palpations: Abdomen is soft.  ?Musculoskeletal:     ?   General: No swelling.  ?   Cervical back: Normal range of motion and neck supple.  ?Skin: ?   General: Skin is warm and dry.  ?Neurological:  ?   General: No focal deficit present.  ?   Mental Status: He is oriented to person, place, and time.  ?Psychiatric:     ?   Mood and Affect: Mood normal.  ? ? ?Lab Results ?Lab Results  ?Component Value Date  ? WBC 8.0 12/02/2021  ? HGB 10.5 (L) 12/02/2021  ? HCT 33.2 (L) 12/02/2021  ? MCV 89.5 12/02/2021  ? PLT 196 12/02/2021  ?  ?Lab Results  ?Component Value Date  ? CREATININE 3.41 (H) 12/02/2021  ? BUN 52 (H) 12/02/2021  ? NA 140 12/02/2021  ? K 5.0 12/02/2021  ? CL 108 12/02/2021  ? CO2 21 12/02/2021  ?  ?Lab Results  ?Component Value Date  ? ALT 29 12/02/2021  ? AST 18 12/02/2021  ? ALKPHOS 136 (H) 10/28/2021  ? BILITOT 0.3 12/02/2021  ?  ?No results found for: CHOL, HDL, LDLCALC, LDLDIRECT, TRIG, CHOLHDL ?No results found for: LABRPR, RPRTITER ?No results found for: HIV1RNAQUANT, HIV1RNAVL, CD4TABS ?  ?Problem List Items Addressed This Visit   ?None ?#Left foot osteomyelitis SP I&D on 1/9 and 1st metatarsal resection on 1/11 ?#Calcaneal osteomyelitis and heel abscess SP I&D on 1/15 ?-OR Cx+ form 1/9+ strep anginosis, proteus mirabilis and Prevotella bivia ?-Bone Bx pathology  on 1/15 showed osteomyelitis ?-Pt was planned to complete  Augmentin bid and doxycyline bid x6 weeks  from OR 1/15 (EOT was 11/26/21). He has been taking antibiotics once a day since discharge(still takin

## 2022-01-12 ENCOUNTER — Ambulatory Visit (INDEPENDENT_AMBULATORY_CARE_PROVIDER_SITE_OTHER): Payer: Medicare HMO | Admitting: Sports Medicine

## 2022-01-12 DIAGNOSIS — L03119 Cellulitis of unspecified part of limb: Secondary | ICD-10-CM

## 2022-01-12 DIAGNOSIS — E1142 Type 2 diabetes mellitus with diabetic polyneuropathy: Secondary | ICD-10-CM

## 2022-01-12 DIAGNOSIS — M79672 Pain in left foot: Secondary | ICD-10-CM

## 2022-01-12 DIAGNOSIS — E11621 Type 2 diabetes mellitus with foot ulcer: Secondary | ICD-10-CM

## 2022-01-12 DIAGNOSIS — Z09 Encounter for follow-up examination after completed treatment for conditions other than malignant neoplasm: Secondary | ICD-10-CM

## 2022-01-12 DIAGNOSIS — L02619 Cutaneous abscess of unspecified foot: Secondary | ICD-10-CM

## 2022-01-12 DIAGNOSIS — M86172 Other acute osteomyelitis, left ankle and foot: Secondary | ICD-10-CM

## 2022-01-12 DIAGNOSIS — L97524 Non-pressure chronic ulcer of other part of left foot with necrosis of bone: Secondary | ICD-10-CM

## 2022-01-12 LAB — COMPLETE METABOLIC PANEL WITH GFR
AG Ratio: 0.9 (calc) — ABNORMAL LOW (ref 1.0–2.5)
ALT: 74 U/L — ABNORMAL HIGH (ref 9–46)
AST: 51 U/L — ABNORMAL HIGH (ref 10–35)
Albumin: 3.7 g/dL (ref 3.6–5.1)
Alkaline phosphatase (APISO): 96 U/L (ref 35–144)
BUN/Creatinine Ratio: 15 (calc) (ref 6–22)
BUN: 62 mg/dL — ABNORMAL HIGH (ref 7–25)
CO2: 21 mmol/L (ref 20–32)
Calcium: 8.8 mg/dL (ref 8.6–10.3)
Chloride: 104 mmol/L (ref 98–110)
Creat: 4.23 mg/dL — ABNORMAL HIGH (ref 0.70–1.35)
Globulin: 4 g/dL (calc) — ABNORMAL HIGH (ref 1.9–3.7)
Glucose, Bld: 146 mg/dL — ABNORMAL HIGH (ref 65–99)
Potassium: 4.7 mmol/L (ref 3.5–5.3)
Sodium: 139 mmol/L (ref 135–146)
Total Bilirubin: 0.3 mg/dL (ref 0.2–1.2)
Total Protein: 7.7 g/dL (ref 6.1–8.1)
eGFR: 15 mL/min/{1.73_m2} — ABNORMAL LOW (ref >=60)

## 2022-01-12 LAB — SPECIMEN COMPROMISED

## 2022-01-12 LAB — CBC WITH DIFFERENTIAL/PLATELET
Absolute Monocytes: 602 cells/uL (ref 200–950)
Basophils Absolute: 28 cells/uL (ref 0–200)
Basophils Relative: 0.3 %
Eosinophils Absolute: 188 cells/uL (ref 15–500)
Eosinophils Relative: 2 %
HCT: 42 % (ref 38.5–50.0)
Hemoglobin: 13.5 g/dL (ref 13.2–17.1)
Lymphs Abs: 2228 cells/uL (ref 850–3900)
MCH: 28.2 pg (ref 27.0–33.0)
MCHC: 32.1 g/dL (ref 32.0–36.0)
MCV: 87.7 fL (ref 80.0–100.0)
MPV: 10.7 fL (ref 7.5–12.5)
Monocytes Relative: 6.4 %
Neutro Abs: 6354 cells/uL (ref 1500–7800)
Neutrophils Relative %: 67.6 %
Platelets: 218 10*3/uL (ref 140–400)
RBC: 4.79 10*6/uL (ref 4.20–5.80)
RDW: 14.5 % (ref 11.0–15.0)
Total Lymphocyte: 23.7 %
WBC: 9.4 10*3/uL (ref 3.8–10.8)

## 2022-01-12 LAB — C-REACTIVE PROTEIN: CRP: 1.1 mg/L (ref <8.0)

## 2022-01-12 LAB — SEDIMENTATION RATE: Sed Rate: 43 mm/h — ABNORMAL HIGH (ref 0–20)

## 2022-01-12 NOTE — Progress Notes (Signed)
Subjective: ?[name] is a 71 y.o. male patient seen today in office for POV # 6 date of most recent surgery 10/16/2021 irrigation debridement with bone biopsy of left heel.  Patient has home nursing assisting with wound care changing the wound VAC 3 times per week and is currently followed by infectious disease with history of osteomyelitis.  Patient st denies constitutional symptoms at this time. ? ?Fasting blood sugar not recorded last A1c 7.0, unchanged from prior. ? ?Patient Active Problem List  ? Diagnosis Date Noted  ? Hyperkalemia 10/28/2021  ? Hypoalbuminemia due to protein-calorie malnutrition ([place]) 10/28/2021  ? Dehydration 10/28/2021  ? Elevated alkaline phosphatase level 10/28/2021  ? Acute osteomyelitis of left foot ([place]) 10/28/2021  ? Diabetic neuropathy ([place]) 10/28/2021  ? Acute kidney injury ([place]) 10/27/2021  ? Malnutrition of moderate degree 10/18/2021  ? Sepsis ([place]) 10/10/2021  ? Gas gangrene of foot ([place]) 10/10/2021  ? Acute kidney injury superimposed on CKD ([place]) 10/10/2021  ? Anemia in chronic kidney disease 05/27/2020  ? Secondary hyperparathyroidism of renal origin ([place]) 02/25/2020  ? Diabetic foot ulcer ([place]) 10/07/2019  ? Benign hypertensive kidney disease with chronic kidney disease 06/17/2019  ? Proteinuria 06/17/2019  ? History of amputation of lesser toe of left foot ([place]) 08/18/2015  ? Hyperlipidemia, mixed 08/18/2015  ? Type 2 diabetes mellitus with peripheral neuropathy ([place]) 08/18/2015  ? Acute CVA (cerebrovascular accident) ([place]) 08/09/2015  ? Essential hypertriglyceridemia 08/09/2015  ? Hypertension, essential 08/09/2015  ? Right-sided muscle weakness 08/09/2015  ? T2DM (type 2 diabetes mellitus) ([place]) 08/09/2015  ? Tobacco abuse 08/09/2015  ? CKD (chronic kidney disease) stage 4, GFR 15-29 ml/min (HCC) 04/17/2013  ? DM2 (diabetes mellitus, type 2) ([place]) 04/17/2013  ? HTN (hypertension) 04/17/2013  ? Osteomyelitis of toe of left foot ([place]) 04/17/2013  ? ? ?Current Outpatient  Medications on File Prior to Visit  ?Medication Sig Dispense Refill  ? amLODipine (NORVASC) 10 MG tablet Take 1 tablet by mouth daily.    ? amoxicillin-clavulanate (AUGMENTIN) 500-125 MG tablet Take 1 tablet (500 mg total) by mouth 2 (two) times daily. Take one tablet BID 60 tablet 0  ? aspirin EC 81 MG tablet Take 81 mg by mouth daily. Swallow whole.    ? atorvastatin (LIPITOR) 80 MG tablet Take 1 tablet by mouth every evening.    ? Blood Glucose Monitoring Suppl (TRUE METRIX METER) w/Device KIT   0  ? docusate sodium (COLACE) 100 MG capsule Take 200 mg by mouth daily.    ? doxycycline (VIBRA-TABS) 100 MG tablet Take 1 tablet (100 mg total) by mouth 2 (two) times daily with a meal. 120 tablet 0  ? Dulaglutide 4.5 MG/0.5ML SOPN Inject 4.5 mg into the skin every Wednesday.    ? glucose blood test strip daily.    ? Lancets (ONETOUCH ULTRASOFT) lancets daily.    ? NOVOLOG MIX 70/30 FLEXPEN (70-30) 100 UNIT/ML FlexPen Inject 32 Units into the skin 2 (two) times daily with a meal.    ? pregabalin (LYRICA) 75 MG capsule 1 capsule for neuropathy    ? sildenafil (VIAGRA) 50 MG tablet Take 50 mg by mouth as needed for erectile dysfunction. (Patient not taking: Reported on 12/02/2021)    ? sodium zirconium cyclosilicate (LOKELMA) 5 g packet 1 packet dissolved in water    ? torsemide (DEMADEX) 20 MG tablet Take 20 mg by mouth daily.    ? traMADol (ULTRAM) 50 MG tablet 1 tablet as needed for severe pain only    ?  ULTICARE MINI PEN NEEDLES 31G X 6 MM MISC   0  ? UNABLE TO FIND daily.    ? ?No current facility-administered medications on file prior to visit.  ? ? ?Allergies  ?Allergen Reactions  ? Losartan Potassium   ?  Other reaction(s): hyperkalemia  ? Sildenafil   ?  Other reaction(s): not effective  ? ? ?Objective: ?There were no vitals filed for this visit. ? ?General: No acute distress, AAOx3  ?Left foot: First ray amputation site well-healed at the medial foot.  There is a full-thickness wound that measures 1.5x 1.8 x 0.3cm  at the left medial heel with fibrogranular tissue in the wound bed and minimal surrounding periwound maceration, no active drainage, no other acute findings, mild swelling to left forefoot no swelling at the heel, no pain with calf compression. ? ?Picture reviewed from infectious disease on yesterday ? ?Assessment and Plan:  ?Problem List Items Addressed This Visit   ? ?  ? Endocrine  ? Type 2 diabetes mellitus with peripheral neuropathy (HCC)  ? Diabetic foot ulcer ([place]) - Primary  ?  ? Musculoskeletal and Integument  ? Acute osteomyelitis of left foot ([place])  ? ?Other Visit Diagnoses   ? ? Cellulitis and abscess of foot, except toes      ? Surgery follow-up      ? Left foot pain      ? ?  ? ? ?  ?-Patient seen and evaluated ?- Excisionally dedbrided ulceration at left medial heel to healthy bleeding borders removing nonviable tissue using a sterile chisel blade. Wound measures post debridement as above.  Wound was debrided to the level of the dermis with viable wound base exposed to promote healing. Hemostasis was achieved with manuel pressure. Patient tolerated procedure well without any discomfort or anesthesia necessary for this wound debridement.  ?-Applied silver nitrate and dry dressing Home nursing to continue with calcium alginate 3 times per week to the area ?-Patient is well aware that we are trying to do everything to salvage his limb and even with wound care and antibiotics he may still result in amputation however at this time patient wants [place] to continue to try to save his leg ?-Continue with antibiotics as recommended by infectious disease patient currently on Augmentin and doxycycline for the next 6 weeks ?-Continue with limited protective weightbearing with Cam boot and rolling walker as previous ?-Will plan for continued wound care at next office visit. In the meantime, patient to call office if any issues or problems arise.  ? ?[name], DPM  ?

## 2022-01-26 ENCOUNTER — Ambulatory Visit: Payer: Medicare HMO | Admitting: Sports Medicine

## 2022-01-26 ENCOUNTER — Encounter: Payer: Self-pay | Admitting: Sports Medicine

## 2022-01-26 DIAGNOSIS — L97524 Non-pressure chronic ulcer of other part of left foot with necrosis of bone: Secondary | ICD-10-CM | POA: Diagnosis not present

## 2022-01-26 DIAGNOSIS — Z09 Encounter for follow-up examination after completed treatment for conditions other than malignant neoplasm: Secondary | ICD-10-CM

## 2022-01-26 DIAGNOSIS — E11621 Type 2 diabetes mellitus with foot ulcer: Secondary | ICD-10-CM

## 2022-01-26 DIAGNOSIS — L03119 Cellulitis of unspecified part of limb: Secondary | ICD-10-CM

## 2022-01-26 DIAGNOSIS — L02619 Cutaneous abscess of unspecified foot: Secondary | ICD-10-CM

## 2022-01-26 DIAGNOSIS — E1142 Type 2 diabetes mellitus with diabetic polyneuropathy: Secondary | ICD-10-CM

## 2022-01-26 DIAGNOSIS — M86172 Other acute osteomyelitis, left ankle and foot: Secondary | ICD-10-CM

## 2022-01-26 DIAGNOSIS — M79672 Pain in left foot: Secondary | ICD-10-CM

## 2022-01-26 NOTE — Progress Notes (Signed)
Subjective: ?[name] is a 71 y.o. male patient seen today in office for POV # 7 date of most recent surgery 10/16/2021 irrigation debridement with bone biopsy of left heel.  Patient has home nursing assisting with wound care changing the wound dressings 3x per week and is currently followed by infectious disease with history of osteomyelitis.  Patient st denies constitutional symptoms at this time. ? ?Fasting blood sugar 101 yesterday and last A1c 7.0, unchanged from prior. ? ?Patient Active Problem List  ? Diagnosis Date Noted  ? Hyperkalemia 10/28/2021  ? Hypoalbuminemia due to protein-calorie malnutrition ([place]) 10/28/2021  ? Dehydration 10/28/2021  ? Elevated alkaline phosphatase level 10/28/2021  ? Acute osteomyelitis of left foot ([place]) 10/28/2021  ? Diabetic neuropathy ([place]) 10/28/2021  ? Acute kidney injury ([place]) 10/27/2021  ? Malnutrition of moderate degree 10/18/2021  ? Sepsis ([place]) 10/10/2021  ? Gas gangrene of foot ([place]) 10/10/2021  ? Acute kidney injury superimposed on CKD ([place]) 10/10/2021  ? Anemia in chronic kidney disease 05/27/2020  ? Secondary hyperparathyroidism of renal origin ([place]) 02/25/2020  ? Diabetic foot ulcer ([place]) 10/07/2019  ? Benign hypertensive kidney disease with chronic kidney disease 06/17/2019  ? Proteinuria 06/17/2019  ? History of amputation of lesser toe of left foot ([place]) 08/18/2015  ? Hyperlipidemia, mixed 08/18/2015  ? Type 2 diabetes mellitus with peripheral neuropathy ([place]) 08/18/2015  ? Acute CVA (cerebrovascular accident) ([place]) 08/09/2015  ? Essential hypertriglyceridemia 08/09/2015  ? Hypertension, essential 08/09/2015  ? Right-sided muscle weakness 08/09/2015  ? T2DM (type 2 diabetes mellitus) ([place]) 08/09/2015  ? Tobacco abuse 08/09/2015  ? CKD (chronic kidney disease) stage 4, GFR 15-29 ml/min (HCC) 04/17/2013  ? DM2 (diabetes mellitus, type 2) ([place]) 04/17/2013  ? HTN (hypertension) 04/17/2013  ? Osteomyelitis of toe of left foot ([place]) 04/17/2013  ? ? ?Current  Outpatient Medications on File Prior to Visit  ?Medication Sig Dispense Refill  ? amLODipine (NORVASC) 10 MG tablet Take 1 tablet by mouth daily.    ? amoxicillin-clavulanate (AUGMENTIN) 500-125 MG tablet Take 1 tablet (500 mg total) by mouth 2 (two) times daily. Take one tablet BID 60 tablet 0  ? aspirin EC 81 MG tablet Take 81 mg by mouth daily. Swallow whole.    ? atorvastatin (LIPITOR) 80 MG tablet Take 1 tablet by mouth every evening.    ? Blood Glucose Monitoring Suppl (TRUE METRIX METER) w/Device KIT   0  ? docusate sodium (COLACE) 100 MG capsule Take 200 mg by mouth daily.    ? doxycycline (VIBRA-TABS) 100 MG tablet Take 1 tablet (100 mg total) by mouth 2 (two) times daily with a meal. 120 tablet 0  ? Dulaglutide 4.5 MG/0.5ML SOPN Inject 4.5 mg into the skin every Wednesday.    ? glucose blood test strip daily.    ? Lancets (ONETOUCH ULTRASOFT) lancets daily.    ? NOVOLOG MIX 70/30 FLEXPEN (70-30) 100 UNIT/ML FlexPen Inject 32 Units into the skin 2 (two) times daily with a meal.    ? pregabalin (LYRICA) 75 MG capsule 1 capsule for neuropathy    ? sildenafil (VIAGRA) 50 MG tablet Take 50 mg by mouth as needed for erectile dysfunction. (Patient not taking: Reported on 12/02/2021)    ? sodium zirconium cyclosilicate (LOKELMA) 5 g packet 1 packet dissolved in water    ? torsemide (DEMADEX) 20 MG tablet Take 20 mg by mouth daily.    ? traMADol (ULTRAM) 50 MG tablet 1 tablet as needed for severe pain only    ?  ULTICARE MINI PEN NEEDLES 31G X 6 MM MISC   0  ? UNABLE TO FIND daily.    ? ?No current facility-administered medications on file prior to visit.  ? ? ?Allergies  ?Allergen Reactions  ? Losartan Potassium   ?  Other reaction(s): hyperkalemia  ? Sildenafil   ?  Other reaction(s): not effective  ? ? ?Objective: ?There were no vitals filed for this visit. ? ?General: No acute distress, AAOx3  ?Left foot: First ray amputation site well-healed at the medial foot.  There is a full-thickness wound that measures 1.2x  1.5 x 0.3cm, smaller than previous at the left medial heel with fibrogranular tissue in the wound bed, no surrounding periwound maceration, no active drainage, no other acute findings, mild swelling to left forefoot no swelling at the heel, no pain with calf compression. ? ? ? ?Assessment and Plan:  ?Problem List Items Addressed This Visit   ? ?  ? Endocrine  ? Type 2 diabetes mellitus with peripheral neuropathy (HCC)  ? Diabetic foot ulcer ([place]) - Primary  ?  ? Musculoskeletal and Integument  ? Acute osteomyelitis of left foot ([place])  ? ?Other Visit Diagnoses   ? ? Cellulitis and abscess of foot, except toes      ? Surgery follow-up      ? Left foot pain      ? ?  ? ? ?  ?-Patient seen and evaluated ?- Excisionally dedbrided ulceration at left medial heel to healthy bleeding borders removing nonviable tissue using a sterile chisel blade. Wound measures post debridement as above.  Wound was debrided to the level of the dermis with viable wound base exposed to promote healing. Hemostasis was achieved with manuel pressure. Patient tolerated procedure well without any discomfort or anesthesia necessary for this wound debridement.  ?-Applied maxorb and dry dressing ; Home nursing to continue with calcium alginate 3 times per week to the area ?-Continue with antibiotics as recommended by infectious disease patient currently on Augmentin and doxycycline for the next 4 weeks ?-Continue with limited protective weightbearing with Cam boot and rolling walker as previous ?-Will plan for continued wound care at next office visit. In the meantime, patient to call office if any issues or problems arise.  ? ?[name], DPM  ?

## 2022-02-09 ENCOUNTER — Ambulatory Visit: Payer: Medicare HMO | Admitting: Sports Medicine

## 2022-02-09 ENCOUNTER — Encounter: Payer: Self-pay | Admitting: Sports Medicine

## 2022-02-09 DIAGNOSIS — L97524 Non-pressure chronic ulcer of other part of left foot with necrosis of bone: Secondary | ICD-10-CM

## 2022-02-09 DIAGNOSIS — E1142 Type 2 diabetes mellitus with diabetic polyneuropathy: Secondary | ICD-10-CM

## 2022-02-09 DIAGNOSIS — Z09 Encounter for follow-up examination after completed treatment for conditions other than malignant neoplasm: Secondary | ICD-10-CM

## 2022-02-09 DIAGNOSIS — M79672 Pain in left foot: Secondary | ICD-10-CM

## 2022-02-09 DIAGNOSIS — M86172 Other acute osteomyelitis, left ankle and foot: Secondary | ICD-10-CM

## 2022-02-09 DIAGNOSIS — E11621 Type 2 diabetes mellitus with foot ulcer: Secondary | ICD-10-CM | POA: Diagnosis not present

## 2022-02-09 DIAGNOSIS — L03119 Cellulitis of unspecified part of limb: Secondary | ICD-10-CM

## 2022-02-09 DIAGNOSIS — L02619 Cutaneous abscess of unspecified foot: Secondary | ICD-10-CM

## 2022-02-09 NOTE — Progress Notes (Signed)
Subjective: ?[name] is a 71 y.o. male patient seen today in office for POV # 8 date of most recent surgery 10/16/2021 irrigation debridement with bone biopsy of left heel.  Patient has home nursing assisting with wound care changing the wound dressings 3x per week and is currently followed by infectious disease with history of osteomyelitis currently on antibiotics.  Patient denies constitutional symptoms at this time and states that he is feeling good. ? ?Fasting blood sugar 100 and last A1c 7.0, unchanged from prior. ? ?Patient Active Problem List  ? Diagnosis Date Noted  ? Hyperkalemia 10/28/2021  ? Hypoalbuminemia due to protein-calorie malnutrition ([place]) 10/28/2021  ? Dehydration 10/28/2021  ? Elevated alkaline phosphatase level 10/28/2021  ? Acute osteomyelitis of left foot ([place]) 10/28/2021  ? Diabetic neuropathy ([place]) 10/28/2021  ? Acute kidney injury ([place]) 10/27/2021  ? Malnutrition of moderate degree 10/18/2021  ? Sepsis ([name]) 10/10/2021  ? Gas gangrene of foot ([place]) 10/10/2021  ? Acute kidney injury superimposed on CKD ([place]) 10/10/2021  ? Anemia in chronic kidney disease 05/27/2020  ? Secondary hyperparathyroidism of renal origin ([place]) 02/25/2020  ? Diabetic foot ulcer ([place]) 10/07/2019  ? Benign hypertensive kidney disease with chronic kidney disease 06/17/2019  ? Proteinuria 06/17/2019  ? History of amputation of lesser toe of left foot ([place]) 08/18/2015  ? Hyperlipidemia, mixed 08/18/2015  ? Type 2 diabetes mellitus with peripheral neuropathy ([place]) 08/18/2015  ? Acute CVA (cerebrovascular accident) ([place]) 08/09/2015  ? Essential hypertriglyceridemia 08/09/2015  ? Hypertension, essential 08/09/2015  ? Right-sided muscle weakness 08/09/2015  ? T2DM (type 2 diabetes mellitus) ([place]) 08/09/2015  ? Tobacco abuse 08/09/2015  ? CKD (chronic kidney disease) stage 4, GFR 15-29 ml/min (HCC) 04/17/2013  ? DM2 (diabetes mellitus, type 2) ([place]) 04/17/2013  ? HTN (hypertension) 04/17/2013  ? Osteomyelitis of toe of  left foot ([place]) 04/17/2013  ? ? ?Current Outpatient Medications on File Prior to Visit  ?Medication Sig Dispense Refill  ? amLODipine (NORVASC) 10 MG tablet Take 1 tablet by mouth daily.    ? amoxicillin-clavulanate (AUGMENTIN) 500-125 MG tablet Take 1 tablet (500 mg total) by mouth 2 (two) times daily. Take one tablet BID 60 tablet 0  ? aspirin EC 81 MG tablet Take 81 mg by mouth daily. Swallow whole.    ? atorvastatin (LIPITOR) 80 MG tablet Take 1 tablet by mouth every evening.    ? Blood Glucose Monitoring Suppl (TRUE METRIX METER) w/Device KIT   0  ? docusate sodium (COLACE) 100 MG capsule Take 200 mg by mouth daily.    ? doxycycline (VIBRA-TABS) 100 MG tablet Take 1 tablet (100 mg total) by mouth 2 (two) times daily with a meal. 120 tablet 0  ? Dulaglutide 4.5 MG/0.5ML SOPN Inject 4.5 mg into the skin every Wednesday.    ? glucose blood test strip daily.    ? Lancets (ONETOUCH ULTRASOFT) lancets daily.    ? NOVOLOG MIX 70/30 FLEXPEN (70-30) 100 UNIT/ML FlexPen Inject 32 Units into the skin 2 (two) times daily with a meal.    ? pregabalin (LYRICA) 75 MG capsule 1 capsule for neuropathy    ? sildenafil (VIAGRA) 50 MG tablet Take 50 mg by mouth as needed for erectile dysfunction. (Patient not taking: Reported on 12/02/2021)    ? sodium zirconium cyclosilicate (LOKELMA) 5 g packet 1 packet dissolved in water    ? torsemide (DEMADEX) 20 MG tablet Take 20 mg by mouth daily.    ? traMADol (ULTRAM) 50 MG tablet 1 tablet  as needed for severe pain only    ? ULTICARE MINI PEN NEEDLES 31G X 6 MM MISC   0  ? UNABLE TO FIND daily.    ? ?No current facility-administered medications on file prior to visit.  ? ? ?Allergies  ?Allergen Reactions  ? Losartan Potassium   ?  Other reaction(s): hyperkalemia  ? Sildenafil   ?  Other reaction(s): not effective  ? ? ?Objective: ?There were no vitals filed for this visit. ? ?General: No acute distress, AAOx3  ?Left foot: First ray amputation site well-healed at the medial foot.  There is a  full-thickness wound that measures 0.4x 0.8 x 0.2cm, smaller than previous at the left medial heel with fibrogranular tissue in the wound bed, no surrounding periwound maceration, no active drainage, no other acute findings,no significant swelling to left forefoot no swelling at the heel, no pain with heel or calf compression. ? ? ? ?Assessment and Plan:  ?Problem List Items Addressed This Visit   ? ?  ? Endocrine  ? Type 2 diabetes mellitus with peripheral neuropathy (HCC)  ? Diabetic foot ulcer ([place]) - Primary  ?  ? Musculoskeletal and Integument  ? Acute osteomyelitis of left foot ([place])  ? ?Other Visit Diagnoses   ? ? Cellulitis and abscess of foot, except toes      ? Left foot pain      ? Surgery follow-up      ? ?  ? ? ?  ?-Patient seen and evaluated ?-Excisionally dedbrided ulceration at left medial heel to healthy bleeding borders removing nonviable tissue using a sterile chisel blade. Wound measures post debridement as above.  Wound was debrided to the level of the dermis with viable wound base exposed to promote healing. Hemostasis was achieved with manuel pressure. Patient tolerated procedure well without any discomfort or anesthesia necessary for this wound debridement.  ?-Photo attached  ?-Applied maxorb and dry dressing ; Home nursing to continue with calcium alginate 3 times per week to the area ?-Continue with antibiotics as recommended by infectious disease patient currently on Augmentin and doxycycline for the next 2 weeks until completed ?-Continue with limited protective weightbearing with Cam boot and rolling walker as previous ?-Will plan for continued wound care at next office visit. In the meantime, patient to call office if any issues or problems arise. After wound is healed with will fit patient for diabetic shoes and custom insoles.  ? ?[name], DPM  ?

## 2022-02-16 DIAGNOSIS — N184 Chronic kidney disease, stage 4 (severe): Secondary | ICD-10-CM | POA: Diagnosis not present

## 2022-02-17 ENCOUNTER — Telehealth: Payer: Self-pay | Admitting: *Deleted

## 2022-02-17 NOTE — Telephone Encounter (Signed)
Nurse w/ [facility] (Betty)is calling to let the physician know that she is discharging patient from their services today,wound is very close to being healed. She has taught husband and wife how to do the dressing changes, nothing more to pack.

## 2022-02-23 ENCOUNTER — Ambulatory Visit (INDEPENDENT_AMBULATORY_CARE_PROVIDER_SITE_OTHER): Payer: Medicare HMO | Admitting: Sports Medicine

## 2022-02-23 DIAGNOSIS — E11621 Type 2 diabetes mellitus with foot ulcer: Secondary | ICD-10-CM

## 2022-02-23 DIAGNOSIS — L03119 Cellulitis of unspecified part of limb: Secondary | ICD-10-CM

## 2022-02-23 DIAGNOSIS — L02619 Cutaneous abscess of unspecified foot: Secondary | ICD-10-CM

## 2022-02-23 DIAGNOSIS — M86172 Other acute osteomyelitis, left ankle and foot: Secondary | ICD-10-CM | POA: Diagnosis not present

## 2022-02-23 DIAGNOSIS — L97524 Non-pressure chronic ulcer of other part of left foot with necrosis of bone: Secondary | ICD-10-CM

## 2022-02-23 DIAGNOSIS — M79672 Pain in left foot: Secondary | ICD-10-CM

## 2022-02-23 NOTE — Progress Notes (Signed)
Subjective: [name] is a 71 y.o. male patient seen today in office for POV # 9 date of most recent surgery 10/16/2021 irrigation debridement with bone biopsy of left heel.  Patient has home nursing assisting with wound care changing the wound dressings 3x per week but last week d/c care because wound is almost healed so wife has been changing dressing.  Fasting blood sugar not recorded and last A1c 7.0, unchanged from prior.  Patient Active Problem List   Diagnosis Date Noted   Hyperkalemia 10/28/2021   Hypoalbuminemia due to protein-calorie malnutrition ([place]) 10/28/2021   Dehydration 10/28/2021   Elevated alkaline phosphatase level 10/28/2021   Acute osteomyelitis of left foot ([place]) 10/28/2021   Diabetic neuropathy ([place]) 10/28/2021   Acute kidney injury ([place]) 10/27/2021   Malnutrition of moderate degree 10/18/2021   Sepsis ([place]) 10/10/2021   Gas gangrene of foot ([place]) 10/10/2021   Acute kidney injury superimposed on CKD ([place]) 10/10/2021   Anemia in chronic kidney disease 05/27/2020   Secondary hyperparathyroidism of renal origin ([place]) 02/25/2020   Diabetic foot ulcer ([place]) 10/07/2019   Benign hypertensive kidney disease with chronic kidney disease 06/17/2019   Proteinuria 06/17/2019   History of amputation of lesser toe of left foot ([place]) 08/18/2015   Hyperlipidemia, mixed 08/18/2015   Type 2 diabetes mellitus with peripheral neuropathy ([place]) 08/18/2015   Acute CVA (cerebrovascular accident) ([place]) 08/09/2015   Essential hypertriglyceridemia 08/09/2015   Hypertension, essential 08/09/2015   Right-sided muscle weakness 08/09/2015   T2DM (type 2 diabetes mellitus) ([place]) 08/09/2015   Tobacco abuse 08/09/2015   CKD (chronic kidney disease) stage 4, GFR 15-29 ml/min ([place]) 04/17/2013   DM2 (diabetes mellitus, type 2) ([place]) 04/17/2013   HTN (hypertension) 04/17/2013   Osteomyelitis of toe of left foot ([place]) 04/17/2013    Current Outpatient Medications on File Prior to Visit   Medication Sig Dispense Refill   amLODipine (NORVASC) 10 MG tablet Take 1 tablet by mouth daily.     aspirin EC 81 MG tablet Take 81 mg by mouth daily. Swallow whole.     atorvastatin (LIPITOR) 80 MG tablet Take 1 tablet by mouth every evening.     Blood Glucose Monitoring Suppl (TRUE METRIX METER) w/Device KIT   0   docusate sodium (COLACE) 100 MG capsule Take 200 mg by mouth daily.     doxycycline (VIBRA-TABS) 100 MG tablet Take 1 tablet (100 mg total) by mouth 2 (two) times daily with a meal. 120 tablet 0   Dulaglutide 4.5 MG/0.5ML SOPN Inject 4.5 mg into the skin every Wednesday.     glucose blood test strip daily.     Lancets (ONETOUCH ULTRASOFT) lancets daily.     NOVOLOG MIX 70/30 FLEXPEN (70-30) 100 UNIT/ML FlexPen Inject 32 Units into the skin 2 (two) times daily with a meal.     pregabalin (LYRICA) 75 MG capsule 1 capsule for neuropathy     sildenafil (VIAGRA) 50 MG tablet Take 50 mg by mouth as needed for erectile dysfunction. (Patient not taking: Reported on 12/02/2021)     sodium zirconium cyclosilicate (LOKELMA) 5 g packet 1 packet dissolved in water     torsemide (DEMADEX) 20 MG tablet Take 20 mg by mouth daily.     traMADol (ULTRAM) 50 MG tablet 1 tablet as needed for severe pain only     ULTICARE MINI PEN NEEDLES 31G X 6 MM MISC   0   UNABLE TO FIND daily.     No current facility-administered medications on file  prior to visit.    Allergies  Allergen Reactions   Losartan Potassium     Other reaction(s): hyperkalemia   Sildenafil     Other reaction(s): not effective    Objective: There were no vitals filed for this visit.  General: No acute distress, AAOx3  Left foot: First ray amputation site well-healed at the medial foot.  There is a prematurely healed wound at the medial heel with minimal reactive keratosis, no surrounding periwound maceration, no active drainage, no other acute findings,no significant swelling to left forefoot no swelling at the heel, no pain  with heel or calf compression.  Assessment and Plan:  Problem List Items Addressed This Visit       Endocrine   Diabetic foot ulcer ([place]) - Primary   Relevant Orders   For Home Use Only DME Diabetic Shoe     Musculoskeletal and Integument   Acute osteomyelitis of left foot ([place])   Relevant Orders   For Home Use Only DME Diabetic Shoe   Other Visit Diagnoses     Cellulitis and abscess of foot, except toes       Left foot pain       Relevant Orders   For Home Use Only DME Diabetic Shoe       -Patient seen and evaluated -Applied betadine and dry dressing; wife to continue with this dressing for 1 more week and thenafter may use clean sock as directed -Continue with antibiotics as recommended by infectious disease patient currently on Augmentin and doxycycline until completed -Continue with limited protective weightbearing with Cam boot and rolling walker as previous until he can be measured for diabetic shoes with custom insoles  -Will plan for final wound check at next office visit with Dr. Blenda Mounts in 3 weeks  [name], DPM

## 2022-03-02 DIAGNOSIS — H40011 Open angle with borderline findings, low risk, right eye: Secondary | ICD-10-CM | POA: Diagnosis not present

## 2022-03-02 DIAGNOSIS — E119 Type 2 diabetes mellitus without complications: Secondary | ICD-10-CM | POA: Diagnosis not present

## 2022-03-13 ENCOUNTER — Telehealth: Payer: Self-pay

## 2022-03-13 NOTE — Telephone Encounter (Signed)
Patient called and stated he has an appointment with his podiatrist on 6/14 in the morning, on the same day as his follow-up with Dr. Candiss Norse. Asked if he should still keep appointment at [facility].  Encouraged patient to keep appointment so that provider could follow up, per last office note on 01/11/22. Patient stated understanding and had no additional questions.  Binnie Kand, RN

## 2022-03-15 ENCOUNTER — Encounter: Payer: Self-pay | Admitting: Internal Medicine

## 2022-03-15 ENCOUNTER — Other Ambulatory Visit: Payer: Self-pay

## 2022-03-15 ENCOUNTER — Encounter: Payer: Self-pay | Admitting: Podiatry

## 2022-03-15 ENCOUNTER — Ambulatory Visit (INDEPENDENT_AMBULATORY_CARE_PROVIDER_SITE_OTHER): Payer: Medicare HMO | Admitting: Internal Medicine

## 2022-03-15 ENCOUNTER — Other Ambulatory Visit: Payer: Medicare HMO

## 2022-03-15 ENCOUNTER — Ambulatory Visit: Payer: Medicare HMO | Admitting: Podiatry

## 2022-03-15 VITALS — BP 174/73 | HR 101 | Temp 98.5°F | Wt 194.0 lb

## 2022-03-15 DIAGNOSIS — L97524 Non-pressure chronic ulcer of other part of left foot with necrosis of bone: Secondary | ICD-10-CM

## 2022-03-15 DIAGNOSIS — M868X7 Other osteomyelitis, ankle and foot: Secondary | ICD-10-CM

## 2022-03-15 DIAGNOSIS — E11621 Type 2 diabetes mellitus with foot ulcer: Secondary | ICD-10-CM

## 2022-03-15 DIAGNOSIS — M86172 Other acute osteomyelitis, left ankle and foot: Secondary | ICD-10-CM | POA: Diagnosis not present

## 2022-03-15 NOTE — Progress Notes (Signed)
Patient Active Problem List   Diagnosis Date Noted   Hyperkalemia 10/28/2021   Hypoalbuminemia due to protein-calorie malnutrition ([place]) 10/28/2021   Dehydration 10/28/2021   Elevated alkaline phosphatase level 10/28/2021   Acute osteomyelitis of left foot ([place]) 10/28/2021   Diabetic neuropathy ([place]) 10/28/2021   Acute kidney injury ([place]) 10/27/2021   Malnutrition of moderate degree 10/18/2021   Sepsis ([place]) 10/10/2021   Gas gangrene of foot ([place]) 10/10/2021   Acute kidney injury superimposed on CKD ([place]) 10/10/2021   Anemia in chronic kidney disease 05/27/2020   Secondary hyperparathyroidism of renal origin ([place]) 02/25/2020   Diabetic foot ulcer ([place]) 10/07/2019   Benign hypertensive kidney disease with chronic kidney disease 06/17/2019   Proteinuria 06/17/2019   History of amputation of lesser toe of left foot ([place]) 08/18/2015   Hyperlipidemia, mixed 08/18/2015   Type 2 diabetes mellitus with peripheral neuropathy ([place]) 08/18/2015   Acute CVA (cerebrovascular accident) ([place]) 08/09/2015   Essential hypertriglyceridemia 08/09/2015   Hypertension, essential 08/09/2015   Right-sided muscle weakness 08/09/2015   T2DM (type 2 diabetes mellitus) ([place]) 08/09/2015   Tobacco abuse 08/09/2015   CKD (chronic kidney disease) stage 4, GFR 15-29 ml/min ([place]) 04/17/2013   DM2 (diabetes mellitus, type 2) ([place]) 04/17/2013   HTN (hypertension) 04/17/2013   Osteomyelitis of toe of left foot ([place]) 04/17/2013    Patient's Medications  New Prescriptions   No medications on file  Previous Medications   AMLODIPINE (NORVASC) 10 MG TABLET    Take 1 tablet by mouth daily.   ASPIRIN EC 81 MG TABLET    Take 81 mg by mouth daily. Swallow whole.   ATORVASTATIN (LIPITOR) 80 MG TABLET    Take 1 tablet by mouth every evening.   BLOOD GLUCOSE MONITORING SUPPL (TRUE METRIX METER) W/DEVICE KIT       DOCUSATE SODIUM (COLACE) 100 MG CAPSULE    Take 200 mg by mouth daily.   DOXYCYCLINE  (VIBRA-TABS) 100 MG TABLET    Take 1 tablet (100 mg total) by mouth 2 (two) times daily with a meal.   DULAGLUTIDE 4.5 MG/0.5ML SOPN    Inject 4.5 mg into the skin every Wednesday.   GLUCOSE BLOOD TEST STRIP    daily.   LANCETS (ONETOUCH ULTRASOFT) LANCETS    daily.   NOVOLOG MIX 70/30 FLEXPEN (70-30) 100 UNIT/ML FLEXPEN    Inject 32 Units into the skin 2 (two) times daily with a meal.   PREGABALIN (LYRICA) 75 MG CAPSULE    1 capsule for neuropathy   SILDENAFIL (VIAGRA) 50 MG TABLET    Take 50 mg by mouth as needed for erectile dysfunction.   SODIUM ZIRCONIUM CYCLOSILICATE (LOKELMA) 5 G PACKET    1 packet dissolved in water   TORSEMIDE (DEMADEX) 20 MG TABLET    Take 20 mg by mouth daily.   TRAMADOL (ULTRAM) 50 MG TABLET    1 tablet as needed for severe pain only   ULTICARE MINI PEN NEEDLES 31G X 6 MM MISC       UNABLE TO FIND    daily.  Modified Medications   No medications on file  Discontinued Medications   No medications on file    Subjective:  9 YM with PMHx as below(including A1c 8.3), CKD stage IV presents for hospital follow-up of left foot osteomyelitis. He was admitted 1/9-1/19/23 to [facility] with worsening diabetic foot wound. He started on bactrim prior to admission. Taken to OR on 1/9  for I7D and 1st metatarsal resection on 1/11 OR Cx + strep anginosis, proteus mirabilis and Prevotella bivia. Started on linezolid(avoid nephrotoxicity of vancomycin) and pip-tazo. He continued to fever. ID engaged. MRI left ankle showed abscess in hindfoot, calcaneal acute osteomyelitis, abscess in the proximal abductor hallucis. He underwent  debridement of left heel with bone Bx on 1/15. HE was transitioned to Augmentin and doxycyline to complete 6 weeks of antibiotics from OR 1/15 (EOT 11/26/21). 10/26/21: He reports slight tremors that started a couple weeks priro to admission. He denies fever, chills, N/V/D. Interval: Admitted to [facility] 1/261/27 for hyperkalemia and AKI on CKD IV. Started on lokelma.   12/02/21 Pt reprots wound vac is off. No new complaints.  Interim: on 3/30 left heel wound debrided by podiatry. 01/06/22 MR left ankle showed acute osteo of plantar medial aspect of calcaneus.  01/11/22: Pt reports he is still taking antibiotics. Instead of taking Augmentin and doxycyline twice a day, he was taking it once a day.  Interim: Seen by podiatry(Dr. Blenda Mounts on 03/15/22) and noted wound was healed. Follows 03/15/22: Reports he is doing well. Still taking antibiotics(twice a day).  Review of Systems: Review of Systems  All other systems reviewed and are negative.   Past Medical History:  Diagnosis Date   Chronic kidney disease    stage 4-Cr 3.3 11-19-19   Diabetes mellitus without complication ([place])    Diabetic ulcer of foot associated with diabetes mellitus due to underlying condition, limited to breakdown of skin ([place])    left   GERD (gastroesophageal reflux disease)    Hyperlipidemia    Hypertension    Non compliance w medication regimen    Stroke ([place]) 2016   no deficits    Social History   Tobacco Use   Smoking status: Former   Smokeless tobacco: Never  Substance Use Topics   Alcohol use: Not Currently   Drug use: Never    No family history on file.  Allergies  Allergen Reactions   Losartan Potassium     Other reaction(s): hyperkalemia   Sildenafil     Other reaction(s): not effective    Health Maintenance  Topic Date Due   FOOT EXAM  Never done   OPHTHALMOLOGY EXAM  Never done   URINE MICROALBUMIN  Never done   Zoster Vaccines- Shingrix (1 of 2) Never done   Pneumonia Vaccine 77+ Years old (2 - PCV) 08/17/2016   COVID-19 Vaccine (4 - Moderna series) 11/09/2020   HEMOGLOBIN A1C  04/09/2022   INFLUENZA VACCINE  05/02/2022   COLONOSCOPY (Pts 45-83yr Insurance coverage will need to be confirmed)  08/10/2025   TETANUS/TDAP  08/17/2025   Hepatitis C Screening  Completed   HPV VACCINES  Aged Out    Objective:  Vitals:   03/15/22 1352  BP: (!) 174/73   Pulse: (!) 101  Temp: 98.5 F (36.9 C)  TempSrc: Temporal  Weight: 194 lb (88 kg)   Body mass index is 26.31 kg/m.  Physical Exam Constitutional:      General: He is not in acute distress.    Appearance: He is normal weight. He is not toxic-appearing.  HENT:     Head: Normocephalic and atraumatic.     Right Ear: External ear normal.     Left Ear: External ear normal.     Nose: No congestion or rhinorrhea.     Mouth/Throat:     Mouth: Mucous membranes are moist.     Pharynx: Oropharynx is clear.  Eyes:  Extraocular Movements: Extraocular movements intact.     Conjunctiva/sclera: Conjunctivae normal.     Pupils: Pupils are equal, round, and reactive to light.  Cardiovascular:     Rate and Rhythm: Normal rate and regular rhythm.     Heart sounds: No murmur heard.    No friction rub. No gallop.  Pulmonary:     Effort: Pulmonary effort is normal.     Breath sounds: Normal breath sounds.  Abdominal:     General: Abdomen is flat. Bowel sounds are normal.     Palpations: Abdomen is soft.  Musculoskeletal:        General: No swelling. Normal range of motion.     Cervical back: Normal range of motion and neck supple.  Skin:    General: Skin is warm and dry.  Neurological:     General: No focal deficit present.     Mental Status: He is oriented to person, place, and time.  Psychiatric:        Mood and Affect: Mood normal.     Lab Results Lab Results  Component Value Date   WBC 9.4 01/11/2022   HGB 13.5 01/11/2022   HCT 42.0 01/11/2022   MCV 87.7 01/11/2022   PLT 218 01/11/2022    Lab Results  Component Value Date   CREATININE 4.23 (H) 01/11/2022   BUN 62 (H) 01/11/2022   NA 139 01/11/2022   K 4.7 01/11/2022   CL 104 01/11/2022   CO2 21 01/11/2022    Lab Results  Component Value Date   ALT 74 (H) 01/11/2022   AST 51 (H) 01/11/2022   ALKPHOS 136 (H) 10/28/2021   BILITOT 0.3 01/11/2022    No results found for: "CHOL", "HDL", "LDLCALC", "LDLDIRECT",  "TRIG", "CHOLHDL" No results found for: "LABRPR", "RPRTITER" No results found for: "HIV1RNAQUANT", "HIV1RNAVL", "CD4TABS"   Problem List Items Addressed This Visit   None Visit Diagnoses     Other osteomyelitis of foot, unspecified laterality ([place])    -  Primary   Relevant Orders   CBC with Differential/Platelet   COMPLETE METABOLIC PANEL WITH GFR   Sed Rate (ESR)   CRP (C-Reactive Protein)   MR ANKLE LEFT WO CONTRAST     #Left foot osteomyelitis SP I&D on 1/9 and 1st metatarsal resection on 1/11 #Calcaneal osteomyelitis and heel abscess SP I&D on 1/15 -OR Cx+ form 1/9+ strep anginosis, proteus mirabilis and Prevotella bivia -Bone Bx pathology on 1/15 showed osteomyelitis -Pt was planned to complete  Augmentin bid and doxycyline bid x6 weeks  from OR 1/15 (EOT was 11/26/21). He has been taking antibiotics once a day since discharge(still taking both antibiotics) -MRI on 4/7 showed acute osteo of calcaneus -Placed on another 6 weeks course of antibiotics(EOT 5/23) with Augmentin and doxy as pt hd taking once a day dosing. He is still taking the antibiotics.  Plan: -Stop antibiotics as he has taken 9 weeks of antibiotics. Pt agreed and understood plan. He was planned to stop on 5/23 at 6 weeks.  -Labs today: cbc, cmp, esr, crp -MRI ordered -Follow-up in one month   I spent more than 45 minutes for this patient encounter including reviewing data/chart, and coordinating care and >50% direct face to face time providing counseling/discussing diagnostics/treatment plan with patient  Laurice Record, [facility] 03/15/2022, 2:04 PM

## 2022-03-15 NOTE — Progress Notes (Signed)
Subjective: [name] is a 71 y.o. male patient seen today in office for POV # 10 date of most recent surgery 10/16/2021 irrigation debridement with bone biopsy of left heel with Dr. Cannon Kettle. Relates wound has been doing well and just keeping bandaid over it for protection. No other issues. Has follow-up with ID today.  Fasting blood sugar not recorded and last A1c 7.0, unchanged from prior.  Patient Active Problem List   Diagnosis Date Noted   Hyperkalemia 10/28/2021   Hypoalbuminemia due to protein-calorie malnutrition ([place]) 10/28/2021   Dehydration 10/28/2021   Elevated alkaline phosphatase level 10/28/2021   Acute osteomyelitis of left foot ([place]) 10/28/2021   Diabetic neuropathy ([place]) 10/28/2021   Acute kidney injury ([place]) 10/27/2021   Malnutrition of moderate degree 10/18/2021   Sepsis ([place]) 10/10/2021   Gas gangrene of foot ([name]) 10/10/2021   Acute kidney injury superimposed on CKD ([place]) 10/10/2021   Anemia in chronic kidney disease 05/27/2020   Secondary hyperparathyroidism of renal origin ([place]) 02/25/2020   Diabetic foot ulcer ([place]) 10/07/2019   Benign hypertensive kidney disease with chronic kidney disease 06/17/2019   Proteinuria 06/17/2019   History of amputation of lesser toe of left foot ([place]) 08/18/2015   Hyperlipidemia, mixed 08/18/2015   Type 2 diabetes mellitus with peripheral neuropathy ([place]) 08/18/2015   Acute CVA (cerebrovascular accident) ([place]) 08/09/2015   Essential hypertriglyceridemia 08/09/2015   Hypertension, essential 08/09/2015   Right-sided muscle weakness 08/09/2015   T2DM (type 2 diabetes mellitus) ([place]) 08/09/2015   Tobacco abuse 08/09/2015   CKD (chronic kidney disease) stage 4, GFR 15-29 ml/min ([place]) 04/17/2013   DM2 (diabetes mellitus, type 2) ([place]) 04/17/2013   HTN (hypertension) 04/17/2013   Osteomyelitis of toe of left foot ([place]) 04/17/2013    Current Outpatient Medications on File Prior to Visit  Medication Sig Dispense Refill    amLODipine (NORVASC) 10 MG tablet Take 1 tablet by mouth daily.     aspirin EC 81 MG tablet Take 81 mg by mouth daily. Swallow whole.     atorvastatin (LIPITOR) 80 MG tablet Take 1 tablet by mouth every evening.     Blood Glucose Monitoring Suppl (TRUE METRIX METER) w/Device KIT   0   docusate sodium (COLACE) 100 MG capsule Take 200 mg by mouth daily.     doxycycline (VIBRA-TABS) 100 MG tablet Take 1 tablet (100 mg total) by mouth 2 (two) times daily with a meal. 120 tablet 0   Dulaglutide 4.5 MG/0.5ML SOPN Inject 4.5 mg into the skin every Wednesday.     glucose blood test strip daily.     Lancets (ONETOUCH ULTRASOFT) lancets daily.     NOVOLOG MIX 70/30 FLEXPEN (70-30) 100 UNIT/ML FlexPen Inject 32 Units into the skin 2 (two) times daily with a meal.     pregabalin (LYRICA) 75 MG capsule 1 capsule for neuropathy     sildenafil (VIAGRA) 50 MG tablet Take 50 mg by mouth as needed for erectile dysfunction. (Patient not taking: Reported on 12/02/2021)     sodium zirconium cyclosilicate (LOKELMA) 5 g packet 1 packet dissolved in water     torsemide (DEMADEX) 20 MG tablet Take 20 mg by mouth daily.     traMADol (ULTRAM) 50 MG tablet 1 tablet as needed for severe pain only     ULTICARE MINI PEN NEEDLES 31G X 6 MM MISC   0   UNABLE TO FIND daily.     No current facility-administered medications on file prior to visit.    Allergies  Allergen Reactions   Losartan Potassium     Other reaction(s): hyperkalemia   Sildenafil     Other reaction(s): not effective    Objective: There were no vitals filed for this visit.  General: No acute distress, AAOx3  Left foot: First ray amputation site well-healed at the medial foot.  There is a prematurely healed wound at the medial heel with minimal reactive keratosis, no surrounding periwound maceration, no active drainage, no other acute findings,no significant swelling to left forefoot no swelling at the heel, no pain with heel or calf  compression.  Assessment and Plan:  Problem List Items Addressed This Visit   None    -Patient seen and evaluated -Wound healed. Advised to keep Band-Aid over area for padding.  -Continue with antibiotics as recommended by infectious disease has follow-up with them today.  -Does have order for MRI placed but not scheduled.  -Will be fitted for DM shoes next week with Aaron Edelman.  -Follow-up in 4 weeks for re-check of wound area.   Lorenda Peck , DPM

## 2022-03-16 LAB — CBC WITH DIFFERENTIAL/PLATELET
Absolute Monocytes: 791 cells/uL (ref 200–950)
Basophils Absolute: 37 cells/uL (ref 0–200)
Basophils Relative: 0.4 %
Eosinophils Absolute: 158 cells/uL (ref 15–500)
Eosinophils Relative: 1.7 %
HCT: 38.3 % — ABNORMAL LOW (ref 38.5–50.0)
Hemoglobin: 12.1 g/dL — ABNORMAL LOW (ref 13.2–17.1)
Lymphs Abs: 2251 cells/uL (ref 850–3900)
MCH: 28 pg (ref 27.0–33.0)
MCHC: 31.6 g/dL — ABNORMAL LOW (ref 32.0–36.0)
MCV: 88.7 fL (ref 80.0–100.0)
MPV: 10.6 fL (ref 7.5–12.5)
Monocytes Relative: 8.5 %
Neutro Abs: 6064 cells/uL (ref 1500–7800)
Neutrophils Relative %: 65.2 %
Platelets: 198 10*3/uL (ref 140–400)
RBC: 4.32 10*6/uL (ref 4.20–5.80)
RDW: 14.3 % (ref 11.0–15.0)
Total Lymphocyte: 24.2 %
WBC: 9.3 10*3/uL (ref 3.8–10.8)

## 2022-03-16 LAB — COMPLETE METABOLIC PANEL WITH GFR
AG Ratio: 1 (calc) (ref 1.0–2.5)
ALT: 22 U/L (ref 9–46)
AST: 17 U/L (ref 10–35)
Albumin: 3.2 g/dL — ABNORMAL LOW (ref 3.6–5.1)
Alkaline phosphatase (APISO): 101 U/L (ref 35–144)
BUN/Creatinine Ratio: 12 (calc) (ref 6–22)
BUN: 45 mg/dL — ABNORMAL HIGH (ref 7–25)
CO2: 23 mmol/L (ref 20–32)
Calcium: 8.4 mg/dL — ABNORMAL LOW (ref 8.6–10.3)
Chloride: 106 mmol/L (ref 98–110)
Creat: 3.88 mg/dL — ABNORMAL HIGH (ref 0.70–1.35)
Globulin: 3.3 g/dL (calc) (ref 1.9–3.7)
Glucose, Bld: 391 mg/dL — ABNORMAL HIGH (ref 65–99)
Potassium: 4.8 mmol/L (ref 3.5–5.3)
Sodium: 138 mmol/L (ref 135–146)
Total Bilirubin: 0.3 mg/dL (ref 0.2–1.2)
Total Protein: 6.5 g/dL (ref 6.1–8.1)
eGFR: 16 mL/min/{1.73_m2} — ABNORMAL LOW (ref >=60)

## 2022-03-16 LAB — SEDIMENTATION RATE: Sed Rate: 33 mm/h — ABNORMAL HIGH (ref 0–20)

## 2022-03-16 LAB — C-REACTIVE PROTEIN: CRP: 2.1 mg/L (ref <8.0)

## 2022-03-17 ENCOUNTER — Telehealth: Payer: Self-pay

## 2022-03-17 NOTE — Telephone Encounter (Signed)
Called patient regarding lab results. Does not have any questions. Will follow up as scheduled. Leatrice Jewels, RMA

## 2022-03-17 NOTE — Telephone Encounter (Signed)
-----   Message from Laurice Record, MD sent at 03/17/2022  2:20 PM EDT ----- Stable labs

## 2022-03-22 ENCOUNTER — Ambulatory Visit (INDEPENDENT_AMBULATORY_CARE_PROVIDER_SITE_OTHER): Payer: Medicare HMO

## 2022-03-22 ENCOUNTER — Other Ambulatory Visit: Payer: Medicare HMO

## 2022-03-22 DIAGNOSIS — Z0000 Encounter for general adult medical examination without abnormal findings: Secondary | ICD-10-CM | POA: Diagnosis not present

## 2022-03-22 DIAGNOSIS — E875 Hyperkalemia: Secondary | ICD-10-CM | POA: Diagnosis not present

## 2022-03-22 DIAGNOSIS — D638 Anemia in other chronic diseases classified elsewhere: Secondary | ICD-10-CM | POA: Diagnosis not present

## 2022-03-22 DIAGNOSIS — E11621 Type 2 diabetes mellitus with foot ulcer: Secondary | ICD-10-CM

## 2022-03-22 DIAGNOSIS — R809 Proteinuria, unspecified: Secondary | ICD-10-CM | POA: Diagnosis not present

## 2022-03-22 DIAGNOSIS — L97524 Non-pressure chronic ulcer of other part of left foot with necrosis of bone: Secondary | ICD-10-CM

## 2022-03-22 DIAGNOSIS — E782 Mixed hyperlipidemia: Secondary | ICD-10-CM | POA: Diagnosis not present

## 2022-03-22 DIAGNOSIS — Z125 Encounter for screening for malignant neoplasm of prostate: Secondary | ICD-10-CM | POA: Diagnosis not present

## 2022-03-22 DIAGNOSIS — I10 Essential (primary) hypertension: Secondary | ICD-10-CM | POA: Diagnosis not present

## 2022-03-22 DIAGNOSIS — N184 Chronic kidney disease, stage 4 (severe): Secondary | ICD-10-CM | POA: Diagnosis not present

## 2022-03-22 DIAGNOSIS — E1122 Type 2 diabetes mellitus with diabetic chronic kidney disease: Secondary | ICD-10-CM | POA: Diagnosis not present

## 2022-03-22 NOTE — Progress Notes (Signed)
Patient in office for Diabetic shoe measurement and selection   Patient foot measured at a size 11 medium width men's.    Provider treating Diabetes at this time: Mina Marble, MD with [facility].    Patient shoe selection is as follows:   1st Choice: [place] [employer] 2nd Choice: [id number] [employer] Walker   Patient foot casted in foam box for custom inserts as well.    Advised patient office will call when shoes and inserts are available for pick-up. Patient verbalized understanding.

## 2022-03-27 ENCOUNTER — Ambulatory Visit (HOSPITAL_COMMUNITY): Payer: Medicare HMO

## 2022-04-03 DIAGNOSIS — N529 Male erectile dysfunction, unspecified: Secondary | ICD-10-CM | POA: Diagnosis not present

## 2022-04-05 ENCOUNTER — Other Ambulatory Visit: Payer: Self-pay | Admitting: Urology

## 2022-04-10 ENCOUNTER — Ambulatory Visit (HOSPITAL_COMMUNITY): Payer: Medicare HMO

## 2022-04-12 ENCOUNTER — Ambulatory Visit (INDEPENDENT_AMBULATORY_CARE_PROVIDER_SITE_OTHER): Payer: Medicare HMO | Admitting: Podiatry

## 2022-04-12 ENCOUNTER — Encounter: Payer: Self-pay | Admitting: Podiatry

## 2022-04-12 DIAGNOSIS — E1142 Type 2 diabetes mellitus with diabetic polyneuropathy: Secondary | ICD-10-CM

## 2022-04-12 DIAGNOSIS — L84 Corns and callosities: Secondary | ICD-10-CM

## 2022-04-12 DIAGNOSIS — M86172 Other acute osteomyelitis, left ankle and foot: Secondary | ICD-10-CM

## 2022-04-12 NOTE — Progress Notes (Signed)
Subjective: [name] is a 71 y.o. male patient seen today in office for POV # 11 date of most recent surgery 10/16/2021 irrigation debridement with bone biopsy of left heel with Dr. Cannon Kettle. Relates wound has been doing well and just keeping bandaid over it for protection. No other issues. Fasting blood sugar not recorded and last A1c 7.0, unchanged from prior.  Patient Active Problem List   Diagnosis Date Noted   Hyperkalemia 10/28/2021   Hypoalbuminemia due to protein-calorie malnutrition ([place]) 10/28/2021   Dehydration 10/28/2021   Elevated alkaline phosphatase level 10/28/2021   Acute osteomyelitis of left foot ([place]) 10/28/2021   Diabetic neuropathy ([place]) 10/28/2021   Acute kidney injury ([place]) 10/27/2021   Malnutrition of moderate degree 10/18/2021   Sepsis ([place]) 10/10/2021   Gas gangrene of foot ([place]) 10/10/2021   Acute kidney injury superimposed on CKD ([place]) 10/10/2021   Anemia in chronic kidney disease 05/27/2020   Secondary hyperparathyroidism of renal origin ([place]) 02/25/2020   Diabetic foot ulcer ([place]) 10/07/2019   Benign hypertensive kidney disease with chronic kidney disease 06/17/2019   Proteinuria 06/17/2019   History of amputation of lesser toe of left foot ([place]) 08/18/2015   Hyperlipidemia, mixed 08/18/2015   Type 2 diabetes mellitus with peripheral neuropathy ([place]) 08/18/2015   Acute CVA (cerebrovascular accident) ([place]) 08/09/2015   Essential hypertriglyceridemia 08/09/2015   Hypertension, essential 08/09/2015   Right-sided muscle weakness 08/09/2015   T2DM (type 2 diabetes mellitus) ([place]) 08/09/2015   Tobacco abuse 08/09/2015   CKD (chronic kidney disease) stage 4, GFR 15-29 ml/min ([place]) 04/17/2013   DM2 (diabetes mellitus, type 2) ([place]) 04/17/2013   HTN (hypertension) 04/17/2013   Osteomyelitis of toe of left foot ([place]) 04/17/2013    Current Outpatient Medications on File Prior to Visit  Medication Sig Dispense Refill   amLODipine (NORVASC) 10 MG tablet Take  1 tablet by mouth daily.     aspirin EC 81 MG tablet Take 81 mg by mouth daily. Swallow whole.     atorvastatin (LIPITOR) 80 MG tablet Take 1 tablet by mouth every evening.     Blood Glucose Monitoring Suppl (TRUE METRIX METER) w/Device KIT   0   docusate sodium (COLACE) 100 MG capsule Take 200 mg by mouth daily.     Dulaglutide 4.5 MG/0.5ML SOPN Inject 4.5 mg into the skin every Wednesday.     glucose blood test strip daily.     Lancets (ONETOUCH ULTRASOFT) lancets daily.     NOVOLOG MIX 70/30 FLEXPEN (70-30) 100 UNIT/ML FlexPen Inject 32 Units into the skin 2 (two) times daily with a meal.     pregabalin (LYRICA) 75 MG capsule 1 capsule for neuropathy     sildenafil (VIAGRA) 50 MG tablet Take 50 mg by mouth as needed for erectile dysfunction. (Patient not taking: Reported on 12/02/2021)     sodium zirconium cyclosilicate (LOKELMA) 5 g packet 1 packet dissolved in water     torsemide (DEMADEX) 20 MG tablet Take 20 mg by mouth daily.     traMADol (ULTRAM) 50 MG tablet 1 tablet as needed for severe pain only     ULTICARE MINI PEN NEEDLES 31G X 6 MM MISC   0   UNABLE TO FIND daily.     No current facility-administered medications on file prior to visit.    Allergies  Allergen Reactions   Losartan Potassium     Other reaction(s): hyperkalemia   Sildenafil     Other reaction(s): not effective    Objective: There were  no vitals filed for this visit.  General: No acute distress, AAOx3  Left foot: First ray amputation site well-healed at the medial foot.  There is a  healed wound at the medial heel with minimal reactive keratosis, no surrounding periwound maceration, no active drainage, no other acute findings,no significant swelling to left forefoot no swelling at the heel, no pain with heel or calf compression.  Assessment and Plan:  Problem List Items Addressed This Visit   None    -Patient seen and evaluated -Wound healed. Advised to keep Band-Aid over area for padding.  -Has  finished antibiotics course.  -Hyperkeratotic tissue debrided without incident. No undelrying wounds.  -Does have order for MRI placed but not had it done yet.  -Awaiting DM shoes -Follow-up in 8 weeks for re-check of wound area.   Lorenda Peck , DPM

## 2022-04-19 ENCOUNTER — Ambulatory Visit (HOSPITAL_COMMUNITY)
Admission: RE | Admit: 2022-04-19 | Discharge: 2022-04-19 | Disposition: A | Discharge status: Home / Self Care | Payer: Medicare HMO | Source: Ambulatory Visit | Attending: Internal Medicine | Admitting: Internal Medicine

## 2022-04-19 DIAGNOSIS — S91302A Unspecified open wound, left foot, initial encounter: Secondary | ICD-10-CM | POA: Diagnosis not present

## 2022-04-19 DIAGNOSIS — M868X7 Other osteomyelitis, ankle and foot: Secondary | ICD-10-CM | POA: Insufficient documentation

## 2022-04-19 DIAGNOSIS — L97509 Non-pressure chronic ulcer of other part of unspecified foot with unspecified severity: Secondary | ICD-10-CM | POA: Diagnosis not present

## 2022-04-19 DIAGNOSIS — M19072 Primary osteoarthritis, left ankle and foot: Secondary | ICD-10-CM | POA: Diagnosis not present

## 2022-04-19 DIAGNOSIS — Q666 Other congenital valgus deformities of feet: Secondary | ICD-10-CM | POA: Diagnosis not present

## 2022-04-21 ENCOUNTER — Ambulatory Visit: Payer: Medicare HMO | Admitting: Internal Medicine

## 2022-04-28 ENCOUNTER — Ambulatory Visit: Payer: Medicare HMO | Admitting: Internal Medicine

## 2022-05-12 ENCOUNTER — Ambulatory Visit (INDEPENDENT_AMBULATORY_CARE_PROVIDER_SITE_OTHER): Payer: Medicare HMO | Admitting: Internal Medicine

## 2022-05-12 ENCOUNTER — Other Ambulatory Visit: Payer: Self-pay

## 2022-05-12 ENCOUNTER — Encounter: Payer: Self-pay | Admitting: Internal Medicine

## 2022-05-12 VITALS — BP 170/91 | HR 105 | Temp 98.2°F | Ht 72.0 in | Wt 202.0 lb

## 2022-05-12 DIAGNOSIS — M86272 Subacute osteomyelitis, left ankle and foot: Secondary | ICD-10-CM | POA: Diagnosis not present

## 2022-05-12 NOTE — Progress Notes (Signed)
Patient Active Problem List   Diagnosis Date Noted   Hyperkalemia 10/28/2021   Hypoalbuminemia due to protein-calorie malnutrition ([place]) 10/28/2021   Dehydration 10/28/2021   Elevated alkaline phosphatase level 10/28/2021   Acute osteomyelitis of left foot ([place]) 10/28/2021   Diabetic neuropathy ([place]) 10/28/2021   Acute kidney injury ([place]) 10/27/2021   Malnutrition of moderate degree 10/18/2021   Sepsis ([place]) 10/10/2021   Gas gangrene of foot ([place]) 10/10/2021   Acute kidney injury superimposed on CKD ([place]) 10/10/2021   Anemia in chronic kidney disease 05/27/2020   Secondary hyperparathyroidism of renal origin ([place]) 02/25/2020   Diabetic foot ulcer ([place]) 10/07/2019   Benign hypertensive kidney disease with chronic kidney disease 06/17/2019   Proteinuria 06/17/2019   History of amputation of lesser toe of left foot ([place]) 08/18/2015   Hyperlipidemia, mixed 08/18/2015   Type 2 diabetes mellitus with peripheral neuropathy ([place]) 08/18/2015   Acute CVA (cerebrovascular accident) ([place]) 08/09/2015   Essential hypertriglyceridemia 08/09/2015   Hypertension, essential 08/09/2015   Right-sided muscle weakness 08/09/2015   T2DM (type 2 diabetes mellitus) ([place]) 08/09/2015   Tobacco abuse 08/09/2015   CKD (chronic kidney disease) stage 4, GFR 15-29 ml/min ([place]) 04/17/2013   DM2 (diabetes mellitus, type 2) ([place]) 04/17/2013   HTN (hypertension) 04/17/2013   Osteomyelitis of toe of left foot ([place]) 04/17/2013    Patient's Medications  New Prescriptions   No medications on file  Previous Medications   AMLODIPINE (NORVASC) 10 MG TABLET    Take 1 tablet by mouth daily.   ASPIRIN EC 81 MG TABLET    Take 81 mg by mouth daily. Swallow whole.   ATORVASTATIN (LIPITOR) 80 MG TABLET    Take 1 tablet by mouth every evening.   BLOOD GLUCOSE MONITORING SUPPL (TRUE METRIX METER) W/DEVICE KIT       DOCUSATE SODIUM (COLACE) 100 MG CAPSULE    Take 200 mg by mouth daily.   DULAGLUTIDE 4.5  MG/0.5ML SOPN    Inject 4.5 mg into the skin every Wednesday.   GLUCOSE BLOOD TEST STRIP    daily.   LANCETS (ONETOUCH ULTRASOFT) LANCETS    daily.   NOVOLOG MIX 70/30 FLEXPEN (70-30) 100 UNIT/ML FLEXPEN    Inject 32 Units into the skin 2 (two) times daily with a meal.   PREGABALIN (LYRICA) 75 MG CAPSULE    1 capsule for neuropathy   SILDENAFIL (VIAGRA) 50 MG TABLET    Take 50 mg by mouth as needed for erectile dysfunction.   SODIUM ZIRCONIUM CYCLOSILICATE (LOKELMA) 5 G PACKET    1 packet dissolved in water   TORSEMIDE (DEMADEX) 20 MG TABLET    Take 20 mg by mouth daily.   TRAMADOL (ULTRAM) 50 MG TABLET    1 tablet as needed for severe pain only   ULTICARE MINI PEN NEEDLES 31G X 6 MM MISC       UNABLE TO FIND    daily.  Modified Medications   No medications on file  Discontinued Medications   No medications on file    Subjective:  33 YM with PMHx as below(including A1c 8.3), CKD stage IV presents for hospital follow-up of left foot osteomyelitis. He was admitted 1/9-1/19/23 to [facility] with worsening diabetic foot wound. He started on bactrim prior to admission. Taken to OR on 1/9 for I7D and 1st metatarsal resection on 1/11 OR Cx + strep anginosis, proteus mirabilis and Prevotella bivia. Started on linezolid(avoid nephrotoxicity of vancomycin) and  pip-tazo. He continued to fever. ID engaged. MRI left ankle showed abscess in hindfoot, calcaneal acute osteomyelitis, abscess in the proximal abductor hallucis. He underwent  debridement of left heel with bone Bx on 1/15. HE was transitioned to Augmentin and doxycyline to complete 6 weeks of antibiotics from OR 1/15 (EOT 11/26/21). 10/26/21: He reports slight tremors that started a couple weeks priro to admission. He denies fever, chills, N/V/D. Interval: Admitted to [facility] 1/261/27 for hyperkalemia and AKI on CKD IV. Started on lokelma.  12/02/21 Pt reprots wound vac is off. No new complaints.  Interim: on 3/30 left heel wound debrided by podiatry.  01/06/22 MR left ankle showed acute osteo of plantar medial aspect of calcaneus.  01/11/22: Pt reports he is still taking antibiotics. Instead of taking Augmentin and doxycyline twice a day, he was taking it once a day.  Interim: Seen by podiatry(Dr. Blenda Mounts on 03/15/22) and noted wound was healed. Follows /03/15/22: Reports he is doing well. Still taking antibiotics(twice a day). /  05/12/22: Antibiotics stopped at last visit(9weeks). Seen by Dr. Blenda Mounts DPM on 71/12 wound healing. MRI on 7/19 showed resolution of abscess.   Review of Systems: Review of Systems  All other systems reviewed and are negative.   Past Medical History:  Diagnosis Date   Chronic kidney disease    stage 4-Cr 3.3 11-19-19   Diabetes mellitus without complication ([place])    Diabetic ulcer of foot associated with diabetes mellitus due to underlying condition, limited to breakdown of skin ([place])    left   GERD (gastroesophageal reflux disease)    Hyperlipidemia    Hypertension    Non compliance w medication regimen    Stroke ([place]) 2016   no deficits    Social History   Tobacco Use   Smoking status: Former   Smokeless tobacco: Never  Substance Use Topics   Alcohol use: Not Currently   Drug use: Never    No family history on file.  Allergies  Allergen Reactions   Losartan Potassium     Other reaction(s): hyperkalemia   Sildenafil     Other reaction(s): not effective    Health Maintenance  Topic Date Due   FOOT EXAM  Never done   OPHTHALMOLOGY EXAM  Never done   URINE MICROALBUMIN  Never done   Zoster Vaccines- Shingrix (1 of 2) Never done   Pneumonia Vaccine 61+ Years old (2 - PCV) 04/18/2018   COVID-19 Vaccine (4 - Moderna series) 11/09/2020   HEMOGLOBIN A1C  04/09/2022   INFLUENZA VACCINE  05/02/2022   COLONOSCOPY (Pts 45-46yr Insurance coverage will need to be confirmed)  08/10/2025   TETANUS/TDAP  08/17/2025   Hepatitis C Screening  Completed   HPV VACCINES  Aged Out    Objective:  Vitals:    05/12/22 0842  BP: (!) 170/91  Pulse: (!) 105  Temp: 98.2 F (36.8 C)  TempSrc: Oral  SpO2: 97%  Weight: 202 lb (91.6 kg)  Height: 6' (1.829 m)   Body mass index is 27.4 kg/m.  Physical Exam Constitutional:      General: He is not in acute distress.    Appearance: He is normal weight. He is not toxic-appearing.  HENT:     Head: Normocephalic and atraumatic.     Right Ear: External ear normal.     Left Ear: External ear normal.     Nose: No congestion or rhinorrhea.     Mouth/Throat:     Mouth: Mucous membranes are moist.  Pharynx: Oropharynx is clear.  Eyes:     Extraocular Movements: Extraocular movements intact.     Conjunctiva/sclera: Conjunctivae normal.     Pupils: Pupils are equal, round, and reactive to light.  Cardiovascular:     Rate and Rhythm: Normal rate and regular rhythm.     Heart sounds: No murmur heard.    No friction rub. No gallop.  Pulmonary:     Effort: Pulmonary effort is normal.     Breath sounds: Normal breath sounds.  Abdominal:     General: Abdomen is flat. Bowel sounds are normal.     Palpations: Abdomen is soft.  Musculoskeletal:        General: No swelling. Normal range of motion.     Cervical back: Normal range of motion and neck supple.  Skin:    General: Skin is warm and dry.  Neurological:     General: No focal deficit present.     Mental Status: He is oriented to person, place, and time.  Psychiatric:        Mood and Affect: Mood normal.     Lab Results Lab Results  Component Value Date   WBC 9.3 03/15/2022   HGB 12.1 (L) 03/15/2022   HCT 38.3 (L) 03/15/2022   MCV 88.7 03/15/2022   PLT 198 03/15/2022    Lab Results  Component Value Date   CREATININE 3.88 (H) 03/15/2022   BUN 45 (H) 03/15/2022   NA 138 03/15/2022   K 4.8 03/15/2022   CL 106 03/15/2022   CO2 23 03/15/2022    Lab Results  Component Value Date   ALT 22 03/15/2022   AST 17 03/15/2022   ALKPHOS 136 (H) 10/28/2021   BILITOT 0.3 03/15/2022     No results found for: "CHOL", "HDL", "LDLCALC", "LDLDIRECT", "TRIG", "CHOLHDL" No results found for: "LABRPR", "RPRTITER" No results found for: "HIV1RNAQUANT", "HIV1RNAVL", "CD4TABS"   A/P #Left foot osteomyelitis SP I&D on 1/9 and 1st metatarsal resection on 1/11 #Calcaneal osteomyelitis and heel abscess SP I&D on 1/15 #Concern for antibiotic adherence -OR Cx+ form 1/9+ strep anginosis, proteus mirabilis and Prevotella bivia -Bone Bx pathology on 1/15 showed osteomyelitis -Pt was planned to complete  Augmentin bid and doxycyline bid x6 weeks  from OR 1/15 (EOT was 11/26/21 but he took it till visit on 4/12).  -MRI on 4/7 showed acute osteo of calcaneus. -Placed on another 6 weeks course of antibiotics(EOT 5/23) with Augmentin and doxy as pt had taking once a day dosing(unclear how many missed doses).  He took 9 weeks course as he did not stop antibiotics till 6/14. -MRI on 7/19 showed interval healing of prior ulcer at the plantar medial aspect.  Resolution of prior fluid sinus tract extending to plantar posterior medial calcaneal bone.Resolution of prior plantar mid to medial posterior calcaneal marrow edema minimal marrow edema not seen at the plantar fascia.   -Seen by Dr. Blenda Mounts , Podiatry  on 7/12 noted to have healed wound Plan: Labs today cbc, cmp, esr, crp Follow-up PRN Continue to follow with podiatry  Laurice Record, [place] [facility] 05/12/2022, 8:46 AM

## 2022-05-13 LAB — COMPLETE METABOLIC PANEL WITH GFR
AG Ratio: 1.1 (calc) (ref 1.0–2.5)
ALT: 19 U/L (ref 9–46)
AST: 15 U/L (ref 10–35)
Albumin: 4 g/dL (ref 3.6–5.1)
Alkaline phosphatase (APISO): 108 U/L (ref 35–144)
BUN/Creatinine Ratio: 13 (calc) (ref 6–22)
BUN: 53 mg/dL — ABNORMAL HIGH (ref 7–25)
CO2: 26 mmol/L (ref 20–32)
Calcium: 9.3 mg/dL (ref 8.6–10.3)
Chloride: 107 mmol/L (ref 98–110)
Creat: 3.95 mg/dL — ABNORMAL HIGH (ref 0.70–1.35)
Globulin: 3.5 g/dL (calc) (ref 1.9–3.7)
Glucose, Bld: 140 mg/dL — ABNORMAL HIGH (ref 65–99)
Potassium: 4.4 mmol/L (ref 3.5–5.3)
Sodium: 141 mmol/L (ref 135–146)
Total Bilirubin: 0.3 mg/dL (ref 0.2–1.2)
Total Protein: 7.5 g/dL (ref 6.1–8.1)
eGFR: 16 mL/min/{1.73_m2} — ABNORMAL LOW (ref >=60)

## 2022-05-13 LAB — CBC WITH DIFFERENTIAL/PLATELET
Absolute Monocytes: 1065 cells/uL — ABNORMAL HIGH (ref 200–950)
Basophils Absolute: 48 cells/uL (ref 0–200)
Basophils Relative: 0.4 %
Eosinophils Absolute: 303 cells/uL (ref 15–500)
Eosinophils Relative: 2.5 %
HCT: 39.4 % (ref 38.5–50.0)
Hemoglobin: 12.9 g/dL — ABNORMAL LOW (ref 13.2–17.1)
Lymphs Abs: 1791 cells/uL (ref 850–3900)
MCH: 29.3 pg (ref 27.0–33.0)
MCHC: 32.7 g/dL (ref 32.0–36.0)
MCV: 89.3 fL (ref 80.0–100.0)
MPV: 10.4 fL (ref 7.5–12.5)
Monocytes Relative: 8.8 %
Neutro Abs: 8894 cells/uL — ABNORMAL HIGH (ref 1500–7800)
Neutrophils Relative %: 73.5 %
Platelets: 215 10*3/uL (ref 140–400)
RBC: 4.41 10*6/uL (ref 4.20–5.80)
RDW: 14.5 % (ref 11.0–15.0)
Total Lymphocyte: 14.8 %
WBC: 12.1 10*3/uL — ABNORMAL HIGH (ref 3.8–10.8)

## 2022-05-13 LAB — C-REACTIVE PROTEIN: CRP: 2.9 mg/L (ref <8.0)

## 2022-05-13 LAB — SEDIMENTATION RATE: Sed Rate: 41 mm/h — ABNORMAL HIGH (ref 0–20)

## 2022-05-17 ENCOUNTER — Telehealth: Payer: Self-pay

## 2022-05-17 DIAGNOSIS — N185 Chronic kidney disease, stage 5: Secondary | ICD-10-CM | POA: Diagnosis not present

## 2022-05-17 DIAGNOSIS — N184 Chronic kidney disease, stage 4 (severe): Secondary | ICD-10-CM | POA: Diagnosis not present

## 2022-05-17 DIAGNOSIS — E1122 Type 2 diabetes mellitus with diabetic chronic kidney disease: Secondary | ICD-10-CM | POA: Diagnosis not present

## 2022-05-17 DIAGNOSIS — I10 Essential (primary) hypertension: Secondary | ICD-10-CM | POA: Diagnosis not present

## 2022-05-17 NOTE — Telephone Encounter (Signed)
-----   Message from Laurice Record, MD sent at 05/17/2022 12:24 PM EDT ----- Stable labs. Mild leukocytosis could be reactive. Return to clinic if wound worsens.

## 2022-05-17 NOTE — Telephone Encounter (Signed)
Called patient to discuss labs. Did not have any questions at this time. Will call if wound is looking worse. Leatrice Jewels, RMA

## 2022-05-22 DIAGNOSIS — R809 Proteinuria, unspecified: Secondary | ICD-10-CM | POA: Diagnosis not present

## 2022-05-22 DIAGNOSIS — N185 Chronic kidney disease, stage 5: Secondary | ICD-10-CM | POA: Diagnosis not present

## 2022-05-22 DIAGNOSIS — N2581 Secondary hyperparathyroidism of renal origin: Secondary | ICD-10-CM | POA: Diagnosis not present

## 2022-05-22 DIAGNOSIS — E1122 Type 2 diabetes mellitus with diabetic chronic kidney disease: Secondary | ICD-10-CM | POA: Diagnosis not present

## 2022-05-22 DIAGNOSIS — I10 Essential (primary) hypertension: Secondary | ICD-10-CM | POA: Diagnosis not present

## 2022-05-24 ENCOUNTER — Encounter (HOSPITAL_BASED_OUTPATIENT_CLINIC_OR_DEPARTMENT_OTHER): Payer: Self-pay | Admitting: Urology

## 2022-05-24 NOTE — Progress Notes (Signed)
Spoke with patient by phone, patient aware to call dr Cain Sieve office to cancel 05-31-2022 surgery due to starting dialysis now

## 2022-05-31 ENCOUNTER — Ambulatory Visit (HOSPITAL_BASED_OUTPATIENT_CLINIC_OR_DEPARTMENT_OTHER): Admission: RE | Admit: 2022-05-31 | Payer: Medicare HMO | Source: Home / Self Care | Admitting: Urology

## 2022-05-31 HISTORY — DX: Secondary hyperparathyroidism of renal origin: N25.81

## 2022-05-31 SURGERY — INSERTION, PENILE PROSTHESIS
Anesthesia: General

## 2022-06-08 ENCOUNTER — Telehealth: Payer: Self-pay | Admitting: Podiatry

## 2022-06-08 NOTE — Telephone Encounter (Signed)
Patient called in to check on the status of his DM shoes. I informed the patient that we received his paperwork and ordered his shoes on 05/31/2022. At this time I don't have a tracking number but I advised the patient to call back in if he has not heard anything by the end of the month. Pt was pleased with outcome and will await a return call for pick up.

## 2022-06-09 ENCOUNTER — Telehealth (INDEPENDENT_AMBULATORY_CARE_PROVIDER_SITE_OTHER): Payer: Self-pay

## 2022-06-09 NOTE — Telephone Encounter (Signed)
Pt called and LVMOM stating he was calling regarding a referral. Pt was scheduled on 8.22.23 for an appt on 08/16/22 beginning at 10:00 for the [place] and following up with provider after that. I left details of the appt on machine and advised to call back with any questions. Nothing further needed at this time.

## 2022-06-13 ENCOUNTER — Encounter: Payer: Self-pay | Admitting: Podiatry

## 2022-06-13 ENCOUNTER — Ambulatory Visit (INDEPENDENT_AMBULATORY_CARE_PROVIDER_SITE_OTHER): Payer: No Typology Code available for payment source | Admitting: Podiatry

## 2022-06-13 DIAGNOSIS — M86172 Other acute osteomyelitis, left ankle and foot: Secondary | ICD-10-CM | POA: Diagnosis not present

## 2022-06-13 DIAGNOSIS — E1142 Type 2 diabetes mellitus with diabetic polyneuropathy: Secondary | ICD-10-CM | POA: Diagnosis not present

## 2022-06-13 NOTE — Progress Notes (Signed)
Subjective: [name] is a 71 y.o. male patient seen today  most recent surgery 10/16/2021 irrigation debridement with bone biopsy of left heel with Dr. Cannon Kettle. Relates wound has been doing well and just keeping bandaid over it for protection. No other issues. Fasting blood sugar not recorded and last A1c 7.0, unchanged from prior.  Patient Active Problem List   Diagnosis Date Noted   Hyperkalemia 10/28/2021   Hypoalbuminemia due to protein-calorie malnutrition ([place]) 10/28/2021   Dehydration 10/28/2021   Elevated alkaline phosphatase level 10/28/2021   Acute osteomyelitis of left foot ([place]) 10/28/2021   Diabetic neuropathy ([place]) 10/28/2021   Acute kidney injury ([place]) 10/27/2021   Malnutrition of moderate degree 10/18/2021   Sepsis ([place]) 10/10/2021   Gas gangrene of foot ([place]) 10/10/2021   Acute kidney injury superimposed on CKD ([place]) 10/10/2021   Anemia in chronic kidney disease 05/27/2020   Secondary hyperparathyroidism of renal origin ([place]) 02/25/2020   Diabetic foot ulcer ([place]) 10/07/2019   Benign hypertensive kidney disease with chronic kidney disease 06/17/2019   Proteinuria 06/17/2019   History of amputation of lesser toe of left foot ([place]) 08/18/2015   Hyperlipidemia, mixed 08/18/2015   Type 2 diabetes mellitus with peripheral neuropathy ([place]) 08/18/2015   Acute CVA (cerebrovascular accident) ([place]) 08/09/2015   Essential hypertriglyceridemia 08/09/2015   Hypertension, essential 08/09/2015   Right-sided muscle weakness 08/09/2015   T2DM (type 2 diabetes mellitus) ([place]) 08/09/2015   Tobacco abuse 08/09/2015   CKD (chronic kidney disease) stage 4, GFR 15-29 ml/min ([place]) 04/17/2013   DM2 (diabetes mellitus, type 2) ([place]) 04/17/2013   HTN (hypertension) 04/17/2013   Osteomyelitis of toe of left foot ([place]) 04/17/2013    Current Outpatient Medications on File Prior to Visit  Medication Sig Dispense Refill   amLODipine (NORVASC) 10 MG tablet Take 1 tablet by mouth daily.      aspirin EC 81 MG tablet Take 81 mg by mouth daily. Swallow whole.     atorvastatin (LIPITOR) 80 MG tablet Take 1 tablet by mouth every evening. (Patient not taking: Reported on 05/12/2022)     Blood Glucose Monitoring Suppl (TRUE METRIX METER) w/Device KIT   0   docusate sodium (COLACE) 100 MG capsule Take 200 mg by mouth daily. (Patient not taking: Reported on 05/12/2022)     Dulaglutide 4.5 MG/0.5ML SOPN Inject 4.5 mg into the skin every Wednesday.     glucose blood test strip daily.     Lancets (ONETOUCH ULTRASOFT) lancets daily.     NOVOLOG MIX 70/30 FLEXPEN (70-30) 100 UNIT/ML FlexPen Inject 32 Units into the skin 2 (two) times daily with a meal.     pregabalin (LYRICA) 75 MG capsule 1 capsule for neuropathy     sildenafil (VIAGRA) 50 MG tablet Take 50 mg by mouth as needed for erectile dysfunction. (Patient not taking: Reported on 12/02/2021)     sodium zirconium cyclosilicate (LOKELMA) 5 g packet 1 packet dissolved in water (Patient not taking: Reported on 05/12/2022)     torsemide (DEMADEX) 20 MG tablet Take 20 mg by mouth daily.     traMADol (ULTRAM) 50 MG tablet 1 tablet as needed for severe pain only (Patient not taking: Reported on 05/12/2022)     ULTICARE MINI PEN NEEDLES 31G X 6 MM MISC   0   No current facility-administered medications on file prior to visit.    Allergies  Allergen Reactions   Losartan Potassium     Other reaction(s): hyperkalemia   Sildenafil     Other  reaction(s): not effective    Objective: There were no vitals filed for this visit.  General: No acute distress, AAOx3  Left foot: First ray amputation site well-healed at the medial foot.  There is a  healed wound at the medial heel with minimal reactive keratosis, no surrounding periwound maceration, no active drainage, no other acute findings,no significant swelling to left forefoot no swelling at the heel, no pain with heel or calf compression.  Assessment and Plan:  Problem List Items Addressed This  Visit   None    -Patient seen and evaluated -Wound healed. Advised to keep Band-Aid over area for padding.  -Has finished antibiotics course.  -No debridement necessary today.  -Awaiting DM shoes -Follow-up in 3 months for diabetic foot check  Lorenda Peck , DPM

## 2022-07-05 ENCOUNTER — Telehealth: Payer: Self-pay | Admitting: *Deleted

## 2022-07-05 NOTE — Patient Outreach (Signed)
  Care Coordination   07/05/2022 Name: [name] MRN: [id number] DOB: Jan 26, 1951   Care Coordination Outreach Attempts:  An unsuccessful telephone outreach was attempted today to offer the patient information about available care coordination services as a benefit of their health plan.   Follow Up Plan:  Additional outreach attempts will be made to offer the patient care coordination information and services.   Encounter Outcome:  No Answer  Care Coordination Interventions Activated:  No   Care Coordination Interventions:  No, not indicated    Jacqlyn Larsen [facility], [place] RN Care Coordinator [phone]

## 2022-07-06 ENCOUNTER — Telehealth: Payer: Self-pay | Admitting: *Deleted

## 2022-07-06 NOTE — Patient Outreach (Signed)
  Care Coordination   07/06/2022 Name: Rony Ratz MRN: [id number] DOB: 04-13-1951   Care Coordination Outreach Attempts:  A second unsuccessful outreach was attempted today to offer the patient with information about available care coordination services as a benefit of their health plan.     Follow Up Plan:  Additional outreach attempts will be made to offer the patient care coordination information and services.   Encounter Outcome:  No Answer  Care Coordination Interventions Activated:  No   Care Coordination Interventions:  No, not indicated  Jacqlyn Larsen [name], [place] RN Care Coordinator [phone]

## 2022-07-07 ENCOUNTER — Telehealth: Payer: Self-pay | Admitting: *Deleted

## 2022-07-07 NOTE — Patient Outreach (Signed)
  Care Coordination   07/07/2022 Name: [name] MRN: [id number] DOB: [birth year]   Care Coordination Outreach Attempts:  A third unsuccessful outreach was attempted today to offer the patient with information about available care coordination services as a benefit of their health plan.   Follow Up Plan:  No further outreach attempts will be made at this time. We have been unable to contact the patient to offer or enroll patient in care coordination services  Encounter Outcome:  No Answer  Care Coordination Interventions Activated:  No   Care Coordination Interventions:  No, not indicated    Jacqlyn Larsen [facility], BSN [name] RN Care Coordinator [phone]

## 2022-07-11 ENCOUNTER — Ambulatory Visit (INDEPENDENT_AMBULATORY_CARE_PROVIDER_SITE_OTHER): Payer: No Typology Code available for payment source | Admitting: Family Medicine

## 2022-07-11 ENCOUNTER — Ambulatory Visit (HOSPITAL_COMMUNITY)
Admission: RE | Admit: 2022-07-11 | Discharge: 2022-07-11 | Disposition: A | Discharge status: Home / Self Care | Payer: No Typology Code available for payment source | Source: Ambulatory Visit | Attending: Family Medicine | Admitting: Family Medicine

## 2022-07-11 VITALS — BP 164/88 | HR 95 | Temp 97.5°F | Ht 72.0 in | Wt 196.0 lb

## 2022-07-11 DIAGNOSIS — S91101A Unspecified open wound of right great toe without damage to nail, initial encounter: Secondary | ICD-10-CM | POA: Diagnosis not present

## 2022-07-11 DIAGNOSIS — Z23 Encounter for immunization: Secondary | ICD-10-CM | POA: Diagnosis not present

## 2022-07-11 DIAGNOSIS — I10 Essential (primary) hypertension: Secondary | ICD-10-CM

## 2022-07-11 DIAGNOSIS — N185 Chronic kidney disease, stage 5: Secondary | ICD-10-CM | POA: Diagnosis not present

## 2022-07-11 DIAGNOSIS — E782 Mixed hyperlipidemia: Secondary | ICD-10-CM | POA: Diagnosis not present

## 2022-07-11 DIAGNOSIS — E1142 Type 2 diabetes mellitus with diabetic polyneuropathy: Secondary | ICD-10-CM | POA: Diagnosis not present

## 2022-07-11 NOTE — Patient Instructions (Addendum)
Labs today.  Xray at the hospital.  Follow up in 1 month (may be sooner pending xray and labs)  Take care  Dr. Lacinda Axon

## 2022-07-12 DIAGNOSIS — M869 Osteomyelitis, unspecified: Secondary | ICD-10-CM | POA: Diagnosis present

## 2022-07-12 DIAGNOSIS — N185 Chronic kidney disease, stage 5: Secondary | ICD-10-CM | POA: Insufficient documentation

## 2022-07-12 DIAGNOSIS — Z87891 Personal history of nicotine dependence: Secondary | ICD-10-CM | POA: Insufficient documentation

## 2022-07-12 DIAGNOSIS — Z8673 Personal history of transient ischemic attack (TIA), and cerebral infarction without residual deficits: Secondary | ICD-10-CM | POA: Insufficient documentation

## 2022-07-12 DIAGNOSIS — S91101A Unspecified open wound of right great toe without damage to nail, initial encounter: Secondary | ICD-10-CM | POA: Insufficient documentation

## 2022-07-12 LAB — LIPID PANEL
Chol/HDL Ratio: 4.1 ratio (ref 0.0–5.0)
Cholesterol, Total: 122 mg/dL (ref 100–199)
HDL: 30 mg/dL — ABNORMAL LOW (ref >39)
LDL Chol Calc (NIH): 56 mg/dL (ref 0–99)
Triglycerides: 219 mg/dL — ABNORMAL HIGH (ref 0–149)
VLDL Cholesterol Cal: 36 mg/dL (ref 5–40)

## 2022-07-12 LAB — HEMOGLOBIN A1C
Est. average glucose Bld gHb Est-mCnc: 174 mg/dL
Hgb A1c MFr Bld: 7.7 % — ABNORMAL HIGH (ref 4.8–5.6)

## 2022-07-12 NOTE — Assessment & Plan Note (Signed)
>>  ASSESSMENT AND PLAN FOR OPEN WOUND OF RIGHT GREAT TOE WRITTEN ON 07/12/2022 12:51 PM BY COOK, JAYCE G, DO  X-ray obtained today.  No evidence of osteomyelitis.  We will reach out to patient's podiatrist.

## 2022-07-12 NOTE — Assessment & Plan Note (Signed)
Continue close follow-up with nephrology. 

## 2022-07-12 NOTE — Assessment & Plan Note (Signed)
LDL at goal on Lipitor.  Continue.

## 2022-07-12 NOTE — Progress Notes (Signed)
Subjective:  Patient ID: [name], male    DOB: Jun 14, 1951  Age: 71 y.o. MRN: [id number]  CC: Chief Complaint  Patient presents with   Establish Care    HPI:  71 year old male with an extensive past medical history including hypertension, CVA, type 2 diabetes with complications (neuropathy, prior amputation, CKD, vascular disease), CKD stage V, history of osteomyelitis, hyperlipidemia presents to establish care.  Patient is followed by nephrology.  BP mildly elevated here today.  Has been previously well controlled.  He is on torsemide and amlodipine.  Hyperlipidemia is well controlled with exclusion of mildly elevated triglycerides.  LDL 56.  On max dose Lipitor.  Patient's diabetes is uncontrolled.  A1c has returned at 7.7.  Currently on Trulicity as well as NovoLog 70/30 32 to 34 units twice daily.  Needs flu shot today.  Patient Active Problem List   Diagnosis Date Noted   CKD (chronic kidney disease) stage 5, GFR less than 15 ml/min ([place]) 07/12/2022   Former smoker 07/12/2022   Open wound of right great toe 07/12/2022   History of stroke 07/12/2022   Diabetic neuropathy ([place]) 10/28/2021   Anemia in chronic kidney disease 05/27/2020   History of amputation of lesser toe of left foot ([place]) 08/18/2015   Hyperlipidemia, mixed 08/18/2015   Type 2 diabetes mellitus with peripheral neuropathy ([place]) 08/18/2015   HTN (hypertension) 04/17/2013    Social Hx   Social History   Socioeconomic History   Marital status: Married    Spouse name: Not on file   Number of children: Not on file   Years of education: Not on file   Highest education level: Not on file  Occupational History   Not on file  Tobacco Use   Smoking status: Former   Smokeless tobacco: Never  Substance and Sexual Activity   Alcohol use: Not Currently   Drug use: Never   Sexual activity: Not on file  Other Topics Concern   Not on file  Social History Narrative   Not on file   Social Determinants  of Health   Financial Resource Strain: Not on file  Food Insecurity: Not on file  Transportation Needs: Not on file  Physical Activity: Not on file  Stress: Not on file  Social Connections: Not on file    Review of Systems  Constitutional: Negative.   Respiratory: Negative.    Cardiovascular: Negative.    Objective:  BP (!) 164/88   Pulse 95   Temp (!) 97.5 F (36.4 C)   Ht 6' (1.829 m)   Wt 196 lb (88.9 kg)   SpO2 100%   BMI 26.58 kg/m      07/11/2022    3:34 PM 07/11/2022    2:28 PM 05/12/2022    8:42 AM  BP/Weight  Systolic BP 976 734 193  Diastolic BP 88 88 91  Wt. (Lbs)  196 202  BMI  26.58 kg/m2 27.4 kg/m2   Physical Exam Constitutional:      General: He is not in acute distress.    Appearance: Normal appearance.  HENT:     Head: Normocephalic and atraumatic.  Eyes:     General:        Right eye: No discharge.        Left eye: No discharge.     Conjunctiva/sclera: Conjunctivae normal.  Cardiovascular:     Rate and Rhythm: Normal rate and regular rhythm.  Pulmonary:     Effort: Pulmonary effort is normal.  Breath sounds: Normal breath sounds. No wheezing or rales.  Feet:     Comments: Amputations of the 1st and 2nd toes of the left foot.   Right great toe with a superficial wound to the dorsum of the toe.  Blister noted to the plantar aspect of the toe. Neurological:     Mental Status: He is alert.  Psychiatric:        Mood and Affect: Mood normal.        Behavior: Behavior normal.     Lab Results  Component Value Date   WBC 12.1 (H) 05/12/2022   HGB 12.9 (L) 05/12/2022   HCT 39.4 05/12/2022   PLT 215 05/12/2022   GLUCOSE 140 (H) 05/12/2022   CHOL 122 07/11/2022   TRIG 219 (H) 07/11/2022   HDL 30 (L) 07/11/2022   LDLCALC 56 07/11/2022   ALT 19 05/12/2022   AST 15 05/12/2022   NA 141 05/12/2022   K 4.4 05/12/2022   CL 107 05/12/2022   CREATININE 3.95 (H) 05/12/2022   BUN 53 (H) 05/12/2022   CO2 26 05/12/2022   HGBA1C 7.7 (H)  07/11/2022     Assessment & Plan:   Problem List Items Addressed This Visit       Cardiovascular and Mediastinum   HTN (hypertension) (Chronic)    BP elevated today.  On amlodipine and torsemide.  Defer to nephrology.        Endocrine   Type 2 diabetes mellitus with peripheral neuropathy ([place]) - Primary    Uncontrolled.  Increasing insulin to 34 units twice daily.  Continue Trulicity.      Relevant Orders   Hemoglobin A1c (Completed)     Genitourinary   CKD (chronic kidney disease) stage 5, GFR less than 15 ml/min (HCC)    Continue close follow-up with nephrology.        Other   Hyperlipidemia, mixed    LDL at goal on Lipitor.  Continue.      Relevant Orders   Lipid panel (Completed)   Open wound of right great toe    X-ray obtained today.  No evidence of osteomyelitis.  We will reach out to patient's podiatrist.      Relevant Orders   DG Toe Great Right (Completed)   Other Visit Diagnoses     Immunization due       Relevant Orders   Flu Vaccine QUAD High Dose(Fluad) (Completed)       Follow-up:  Return in about 1 month (around 08/11/2022).  [place]

## 2022-07-12 NOTE — Assessment & Plan Note (Signed)
X-ray obtained today.  No evidence of osteomyelitis.  We will reach out to patient's podiatrist.

## 2022-07-12 NOTE — Assessment & Plan Note (Signed)
BP elevated today.  On amlodipine and torsemide.  Defer to nephrology.

## 2022-07-12 NOTE — Assessment & Plan Note (Signed)
Uncontrolled.  Increasing insulin to 34 units twice daily.  Continue Trulicity.

## 2022-07-13 DIAGNOSIS — Z86010 Personal history of colonic polyps: Secondary | ICD-10-CM | POA: Diagnosis not present

## 2022-07-13 DIAGNOSIS — E785 Hyperlipidemia, unspecified: Secondary | ICD-10-CM | POA: Diagnosis not present

## 2022-07-13 DIAGNOSIS — I129 Hypertensive chronic kidney disease with stage 1 through stage 4 chronic kidney disease, or unspecified chronic kidney disease: Secondary | ICD-10-CM | POA: Diagnosis not present

## 2022-07-13 DIAGNOSIS — Z89412 Acquired absence of left great toe: Secondary | ICD-10-CM | POA: Diagnosis not present

## 2022-07-13 DIAGNOSIS — N2581 Secondary hyperparathyroidism of renal origin: Secondary | ICD-10-CM | POA: Diagnosis not present

## 2022-07-13 DIAGNOSIS — Z8673 Personal history of transient ischemic attack (TIA), and cerebral infarction without residual deficits: Secondary | ICD-10-CM | POA: Diagnosis not present

## 2022-07-13 DIAGNOSIS — N184 Chronic kidney disease, stage 4 (severe): Secondary | ICD-10-CM | POA: Diagnosis not present

## 2022-07-13 DIAGNOSIS — F17211 Nicotine dependence, cigarettes, in remission: Secondary | ICD-10-CM | POA: Diagnosis not present

## 2022-07-13 DIAGNOSIS — Z89422 Acquired absence of other left toe(s): Secondary | ICD-10-CM | POA: Diagnosis not present

## 2022-07-13 DIAGNOSIS — Z008 Encounter for other general examination: Secondary | ICD-10-CM | POA: Diagnosis not present

## 2022-07-13 DIAGNOSIS — Z6826 Body mass index (BMI) 26.0-26.9, adult: Secondary | ICD-10-CM | POA: Diagnosis not present

## 2022-07-13 DIAGNOSIS — E663 Overweight: Secondary | ICD-10-CM | POA: Diagnosis not present

## 2022-07-13 DIAGNOSIS — Z794 Long term (current) use of insulin: Secondary | ICD-10-CM | POA: Diagnosis not present

## 2022-07-13 DIAGNOSIS — E1169 Type 2 diabetes mellitus with other specified complication: Secondary | ICD-10-CM | POA: Diagnosis not present

## 2022-07-13 DIAGNOSIS — E1122 Type 2 diabetes mellitus with diabetic chronic kidney disease: Secondary | ICD-10-CM | POA: Diagnosis not present

## 2022-07-24 DIAGNOSIS — E1122 Type 2 diabetes mellitus with diabetic chronic kidney disease: Secondary | ICD-10-CM | POA: Diagnosis not present

## 2022-07-24 DIAGNOSIS — I10 Essential (primary) hypertension: Secondary | ICD-10-CM | POA: Diagnosis not present

## 2022-07-24 DIAGNOSIS — R809 Proteinuria, unspecified: Secondary | ICD-10-CM | POA: Diagnosis not present

## 2022-07-24 DIAGNOSIS — N2581 Secondary hyperparathyroidism of renal origin: Secondary | ICD-10-CM | POA: Diagnosis not present

## 2022-07-24 DIAGNOSIS — N185 Chronic kidney disease, stage 5: Secondary | ICD-10-CM | POA: Diagnosis not present

## 2022-07-27 ENCOUNTER — Telehealth: Payer: Self-pay | Admitting: Podiatry

## 2022-07-27 NOTE — Telephone Encounter (Signed)
LVM for pt to call back for an appt for DM shoe pick up

## 2022-07-31 ENCOUNTER — Ambulatory Visit (INDEPENDENT_AMBULATORY_CARE_PROVIDER_SITE_OTHER): Payer: No Typology Code available for payment source | Admitting: *Deleted

## 2022-07-31 DIAGNOSIS — L84 Corns and callosities: Secondary | ICD-10-CM

## 2022-07-31 DIAGNOSIS — E1142 Type 2 diabetes mellitus with diabetic polyneuropathy: Secondary | ICD-10-CM

## 2022-07-31 DIAGNOSIS — S98132A Complete traumatic amputation of one left lesser toe, initial encounter: Secondary | ICD-10-CM | POA: Diagnosis not present

## 2022-07-31 NOTE — Progress Notes (Signed)
Patient presents today to pick up diabetic shoes and insoles.  Patient was dispensed 1 pair of diabetic shoes and 3 pairs of foam casted diabetic insoles. Fit was satisfactory. Instructions for break-in and wear was reviewed and a copy was given to the patient.   Re-appointment for regularly scheduled diabetic foot care visits or if they should experience any trouble with the shoes or insoles.  

## 2022-08-02 DIAGNOSIS — M869 Osteomyelitis, unspecified: Secondary | ICD-10-CM

## 2022-08-02 HISTORY — DX: Osteomyelitis, unspecified: M86.9

## 2022-08-03 ENCOUNTER — Ambulatory Visit (INDEPENDENT_AMBULATORY_CARE_PROVIDER_SITE_OTHER): Payer: No Typology Code available for payment source | Admitting: Podiatry

## 2022-08-03 DIAGNOSIS — L97512 Non-pressure chronic ulcer of other part of right foot with fat layer exposed: Secondary | ICD-10-CM | POA: Diagnosis not present

## 2022-08-03 DIAGNOSIS — S98132A Complete traumatic amputation of one left lesser toe, initial encounter: Secondary | ICD-10-CM

## 2022-08-03 DIAGNOSIS — E1142 Type 2 diabetes mellitus with diabetic polyneuropathy: Secondary | ICD-10-CM

## 2022-08-03 MED ORDER — AMOXICILLIN-POT CLAVULANATE 875-125 MG PO TABS: 1.0000 | ORAL_TABLET | Freq: Two times a day (BID) | ORAL | 0 refills | Status: DC

## 2022-08-03 NOTE — Progress Notes (Signed)
Subjective:  Patient ID: [name], male    DOB: [birth year],  MRN: [id number]  Chief Complaint  Patient presents with   Wound Check    Right great toe ulcer x1 week. Dressing changes daily with betadine. Diabetic. Blood sugar reading 106 (10:25 this morning)    71 y.o. male presents with concern for right great toe ulcer x1 week.  He has been doing dressing changes with daily Betadine and gauze.  He does have a history of having prior wounds on the left foot and need an amputation for that.  He is not currently having any issues with wounds or concern on the left foot at this time.  He says this right great toe ulceration started in 2 places after he wore a tight fitting pair of shoes to church.  Since then he has had wound formation with some drainage swelling and discoloration of the toe.  He denies any nausea vomiting fever chills however.  Has not been on antibiotics at this time.  He has never seen vascular surgery.  He does report that he had an x-ray performed of the right foot and claims he also had an MRI though it is unclear if he had an MRI or just the x-rays.  Past Medical History:  Diagnosis Date   Chronic kidney disease    stage 5   Diabetes mellitus without complication ([place])    Diabetic ulcer of foot associated with diabetes mellitus due to underlying condition, limited to breakdown of skin ([place])    left   GERD (gastroesophageal reflux disease)    Hyperlipidemia    Hypertension    Non compliance w medication regimen    Secondary hyperparathyroidism of renal origin [facility])    per nephrology lov dr Candiss Norse 05-22-2022   Stroke [facility]) 2016   no deficits    Allergies  Allergen Reactions   Losartan Potassium     Other reaction(s): hyperkalemia   Sildenafil     Other reaction(s): not effective    ROS: Negative except as per HPI above  Objective:  General: AAO x3, NAD  Dermatological: Hyperkeratotic lesion present at the distal aspect of the right hallux.  There are 2  areas of ulceration present on the right hallux.  1 lesion is at the dorsal medial aspect of the hallux interphalangeal joint.  This measures approximately 0.7 x 0.6 x 0.1 cm.  Hyperkeratotic margins no active drainage probes to subcutaneous fat tissue.  The second lesion is at the distal medial aspect of the hallux plantarly measures approximately 1 x 1.1 x 0.2 cm with hyperkeratotic margins no active drainage probes to subcutaneous fat tissue.  Both lesions do bleed well upon debridement.  Second toe has some hyperkeratotic tissue present distal medial aspect however no open wound or drainage there.  Vascular:  Dorsalis Pedis 2+ Right foot, PT 0/4 right foot  Capillary fill time diminished to right hallux  Neruologic: Grossly diminished via light touch bilateral.   Musculoskeletal: HAV deformity R foot        Radiographs:  Date: 07/11/2022  XR the right foot Weightbearing AP/Lateral/Oblique   Findings: 1. Soft tissue wound about the medial aspect of the toe at the level of the interphalangeal joint. 2. Small erosion involving the proximal phalanx at the articular surface is not subjacent to the site of wound and likely unrelated, early gout. No bony destructive change in the region of wound to suggest osteomyelitis. 3. Prominent vascular calcifications Assessment:   1. Ulcer of right foot  with fat layer exposed ([place])   2. Type 2 diabetes mellitus with peripheral neuropathy (HCC)   3. Amputated toe of left foot ([place])      Plan:  Patient was evaluated and treated and all questions answered.  Ulcer of the right hallux 2 separate areas with fat layer exposed.  No evidence of deep infection/ osteomyelitis at this time.  -We discussed the etiology and factors that are a part of the wound healing process.  We also discussed the risk of infection both soft tissue and osteomyelitis from open ulceration.  Discussed the risk of limb loss if this happens or worsens. -Debridement as  below. -Dressed with Betadine and gauze, DSD. -Continue home dressing changes daily with Betadine and dry sterile gauze -Continue off-loading with restricted weightbearing. -Vascular testing patient referred for ABI testing and referral to vascular surgery due to concern for peripheral arterial disease. -HgbA1c: 7.7 on July 11, 2022 -Last antibiotics: Patient will be started on a course of Augmentin 875-125 mg twice daily for 14 days -Imaging: x-ray reviewed, shows no signs of erosions, osteolysis, osteomyelitis or emphysema.  Patient reports he has had an MRI done of the right foot recently however I do not have those results in epic.  I recommend he bring them to his next visit if he can find them. He may be confusing MRI for XR recently performed. Will hold on additional imaging for now.   Procedure: Excisional Debridement of Wound Rationale: Removal of non-viable soft tissue from the wound to promote healing.  Anesthesia: none Post-Debridement Wound Measurements: dorsal IPJ wound measures: 0.7 x 0.6 x 0.1 cm.  distal medial aspect of the hallux plantarly measures: 1 x 1.1 x 0.2 cm Type of Debridement: Sharp Excisional with 15 blade scalpel  Tissue Removed: Non-viable soft tissue Depth of Debridement: subcutaneous tissue. Technique: Sharp excisional debridement to bleeding, viable wound base.  Dressing: Dry, sterile, compression dressing. Disposition: Patient tolerated procedure well.     Return in about 2 weeks (around 08/17/2022) for R hallux ulcer.          Everitt Amber, DPM [facility]

## 2022-08-11 ENCOUNTER — Ambulatory Visit (INDEPENDENT_AMBULATORY_CARE_PROVIDER_SITE_OTHER): Payer: No Typology Code available for payment source | Admitting: Family Medicine

## 2022-08-11 ENCOUNTER — Other Ambulatory Visit (INDEPENDENT_AMBULATORY_CARE_PROVIDER_SITE_OTHER): Payer: Self-pay | Admitting: Nephrology

## 2022-08-11 VITALS — BP 138/60 | HR 91 | Temp 97.0°F | Ht 72.0 in | Wt 194.0 lb

## 2022-08-11 DIAGNOSIS — N186 End stage renal disease: Secondary | ICD-10-CM

## 2022-08-11 DIAGNOSIS — E1142 Type 2 diabetes mellitus with diabetic polyneuropathy: Secondary | ICD-10-CM | POA: Diagnosis not present

## 2022-08-11 DIAGNOSIS — N185 Chronic kidney disease, stage 5: Secondary | ICD-10-CM

## 2022-08-11 DIAGNOSIS — I10 Essential (primary) hypertension: Secondary | ICD-10-CM | POA: Diagnosis not present

## 2022-08-11 DIAGNOSIS — E782 Mixed hyperlipidemia: Secondary | ICD-10-CM

## 2022-08-11 DIAGNOSIS — S91101A Unspecified open wound of right great toe without damage to nail, initial encounter: Secondary | ICD-10-CM | POA: Diagnosis not present

## 2022-08-11 NOTE — Patient Instructions (Addendum)
Please be sure to see vascular.  Finish antibiotics.  We will call and arrange MRI.  Follow up in 3 months or sooner if needed.  Close follow up with Podiatry.

## 2022-08-13 NOTE — Progress Notes (Signed)
Subjective:  Patient ID: [name], male    DOB: 1951/09/09  Age: 71 y.o. MRN: [id number]  CC: Chief Complaint  Patient presents with   Diabetes    Right foot taking abx , had xrays done- per foot doc pcp would determine if MRI is needed    HPI:  71 year old male with an extensive past medical history including hypertension, type 2 diabetes with complications including peripheral neuropathy and CKD stage V, anemia of chronic disease, history of amputation, history of stroke, hyperlipidemia presents for follow-up.  Patient has a current ulceration/wound of the right great toe.  He is being followed by podiatry.  He has a history of amputation.  High risk.  Needs reassessment today.  May need MRI.  Patient is following closely with nephrology.  He has an upcoming appointment with vascular surgery for AV fistula.    Patient's LDL is at goal on Lipitor.  Patient's last A1c was 7.7.  I increased his insulin and now his blood sugars are much better controlled.  He is keeping them within range as evident by his CGM.  Blood pressure is stable on amlodipine and torsemide.  Patient Active Problem List   Diagnosis Date Noted   CKD (chronic kidney disease) stage 5, GFR less than 15 ml/min ([place]) 07/12/2022   Former smoker 07/12/2022   Open wound of right great toe 07/12/2022   History of stroke 07/12/2022   Diabetic neuropathy ([place]) 10/28/2021   Anemia in chronic kidney disease 05/27/2020   History of amputation of lesser toe of left foot ([place]) 08/18/2015   Hyperlipidemia, mixed 08/18/2015   Type 2 diabetes mellitus with peripheral neuropathy ([place]) 08/18/2015   HTN (hypertension) 04/17/2013    Social Hx   Social History   Socioeconomic History   Marital status: Married    Spouse name: Not on file   Number of children: Not on file   Years of education: Not on file   Highest education level: Not on file  Occupational History   Not on file  Tobacco Use   Smoking status: Former    Smokeless tobacco: Never  Substance and Sexual Activity   Alcohol use: Not Currently   Drug use: Never   Sexual activity: Not on file  Other Topics Concern   Not on file  Social History Narrative   Not on file   Social Determinants of Health   Financial Resource Strain: Not on file  Food Insecurity: Not on file  Transportation Needs: Not on file  Physical Activity: Not on file  Stress: Not on file  Social Connections: Not on file    Review of Systems  Respiratory: Negative.    Cardiovascular: Negative.   Skin:  Positive for wound.   Objective:  BP 138/60   Pulse 91   Temp (!) 97 F (36.1 C)   Ht 6' (1.829 m)   Wt 194 lb (88 kg)   SpO2 99%   BMI 26.31 kg/m      08/11/2022    2:34 PM 07/11/2022    3:34 PM 07/11/2022    2:28 PM  BP/Weight  Systolic BP 950 932 671  Diastolic BP 60 88 88  Wt. (Lbs) 194  196  BMI 26.31 kg/m2  26.58 kg/m2   Physical Exam  Lab Results  Component Value Date   WBC 12.1 (H) 05/12/2022   HGB 12.9 (L) 05/12/2022   HCT 39.4 05/12/2022   PLT 215 05/12/2022   GLUCOSE 140 (H) 05/12/2022  CHOL 122 07/11/2022   TRIG 219 (H) 07/11/2022   HDL 30 (L) 07/11/2022   LDLCALC 56 07/11/2022   ALT 19 05/12/2022   AST 15 05/12/2022   NA 141 05/12/2022   K 4.4 05/12/2022   CL 107 05/12/2022   CREATININE 3.95 (H) 05/12/2022   BUN 53 (H) 05/12/2022   CO2 26 05/12/2022   HGBA1C 7.7 (H) 07/11/2022     Assessment & Plan:   Problem List Items Addressed This Visit       Cardiovascular and Mediastinum   HTN (hypertension) (Chronic)    BP stable.  Continue amlodipine and torsemide.        Endocrine   Type 2 diabetes mellitus with peripheral neuropathy [facility])    Patient has a CGM.  His blood glucose readings are well controlled at this time.  Continue current insulin dosing and Trulicity.        Genitourinary   CKD (chronic kidney disease) stage 5, GFR less than 15 ml/min (HCC)    Following closely with nephrology and preparing  for dialysis.        Other   Hyperlipidemia, mixed    LDL at goal.  Triglycerides elevated.  Likely secondary to underlying diabetes.  Watch diet closely.  Continue Lipitor.      Open wound of right great toe - Primary    Arranging MRI to ensure no underlying osteomyelitis.  Continue close follow-up with podiatry.      Relevant Orders   MR TOES RIGHT WO CONTRAST   Follow-up:  Return in about 3 months (around 11/11/2022).  [place]

## 2022-08-13 NOTE — Assessment & Plan Note (Signed)
Following closely with nephrology and preparing for dialysis.

## 2022-08-13 NOTE — Assessment & Plan Note (Signed)
Arranging MRI to ensure no underlying osteomyelitis.  Continue close follow-up with podiatry.

## 2022-08-13 NOTE — Assessment & Plan Note (Signed)
BP stable.  Continue amlodipine and torsemide.

## 2022-08-13 NOTE — Assessment & Plan Note (Signed)
LDL at goal.  Triglycerides elevated.  Likely secondary to underlying diabetes.  Watch diet closely.  Continue Lipitor.

## 2022-08-13 NOTE — Assessment & Plan Note (Signed)
>>  ASSESSMENT AND PLAN FOR OPEN WOUND OF RIGHT GREAT TOE WRITTEN ON 08/13/2022  8:55 AM BY COOK, JAYCE G, DO  Arranging MRI to ensure no underlying osteomyelitis.  Continue close follow-up with podiatry.

## 2022-08-13 NOTE — Assessment & Plan Note (Signed)
Patient has a CGM.  His blood glucose readings are well controlled at this time.  Continue current insulin dosing and Trulicity.

## 2022-08-16 ENCOUNTER — Other Ambulatory Visit (INDEPENDENT_AMBULATORY_CARE_PROVIDER_SITE_OTHER): Payer: Self-pay | Admitting: Nurse Practitioner

## 2022-08-16 ENCOUNTER — Inpatient Hospital Stay (HOSPITAL_COMMUNITY)
Admission: EM | Admit: 2022-08-16 | Discharge: 2022-08-20 | DRG: 629 | Disposition: A | Discharge status: Home / Self Care | Payer: No Typology Code available for payment source | Attending: Internal Medicine | Admitting: Internal Medicine

## 2022-08-16 ENCOUNTER — Ambulatory Visit (INDEPENDENT_AMBULATORY_CARE_PROVIDER_SITE_OTHER): Payer: No Typology Code available for payment source

## 2022-08-16 ENCOUNTER — Ambulatory Visit (HOSPITAL_COMMUNITY)
Admission: RE | Admit: 2022-08-16 | Discharge: 2022-08-16 | Disposition: A | Discharge status: Home / Self Care | Payer: No Typology Code available for payment source | Source: Ambulatory Visit | Attending: Family Medicine | Admitting: Family Medicine

## 2022-08-16 ENCOUNTER — Other Ambulatory Visit: Payer: Self-pay

## 2022-08-16 ENCOUNTER — Ambulatory Visit (INDEPENDENT_AMBULATORY_CARE_PROVIDER_SITE_OTHER): Payer: No Typology Code available for payment source | Admitting: Nurse Practitioner

## 2022-08-16 ENCOUNTER — Encounter (INDEPENDENT_AMBULATORY_CARE_PROVIDER_SITE_OTHER): Payer: Self-pay | Admitting: Nurse Practitioner

## 2022-08-16 VITALS — BP 155/75 | HR 81 | Resp 16 | Wt 194.4 lb

## 2022-08-16 DIAGNOSIS — N185 Chronic kidney disease, stage 5: Secondary | ICD-10-CM | POA: Diagnosis present

## 2022-08-16 DIAGNOSIS — E1142 Type 2 diabetes mellitus with diabetic polyneuropathy: Secondary | ICD-10-CM | POA: Diagnosis present

## 2022-08-16 DIAGNOSIS — E782 Mixed hyperlipidemia: Secondary | ICD-10-CM | POA: Diagnosis not present

## 2022-08-16 DIAGNOSIS — E11621 Type 2 diabetes mellitus with foot ulcer: Secondary | ICD-10-CM | POA: Diagnosis present

## 2022-08-16 DIAGNOSIS — D631 Anemia in chronic kidney disease: Secondary | ICD-10-CM | POA: Diagnosis present

## 2022-08-16 DIAGNOSIS — Z7985 Long-term (current) use of injectable non-insulin antidiabetic drugs: Secondary | ICD-10-CM

## 2022-08-16 DIAGNOSIS — Z89412 Acquired absence of left great toe: Secondary | ICD-10-CM

## 2022-08-16 DIAGNOSIS — Z888 Allergy status to other drugs, medicaments and biological substances status: Secondary | ICD-10-CM

## 2022-08-16 DIAGNOSIS — L97509 Non-pressure chronic ulcer of other part of unspecified foot with unspecified severity: Secondary | ICD-10-CM | POA: Diagnosis not present

## 2022-08-16 DIAGNOSIS — N186 End stage renal disease: Secondary | ICD-10-CM

## 2022-08-16 DIAGNOSIS — Z794 Long term (current) use of insulin: Secondary | ICD-10-CM

## 2022-08-16 DIAGNOSIS — I120 Hypertensive chronic kidney disease with stage 5 chronic kidney disease or end stage renal disease: Secondary | ICD-10-CM | POA: Diagnosis present

## 2022-08-16 DIAGNOSIS — E1169 Type 2 diabetes mellitus with other specified complication: Secondary | ICD-10-CM | POA: Diagnosis not present

## 2022-08-16 DIAGNOSIS — S91101A Unspecified open wound of right great toe without damage to nail, initial encounter: Secondary | ICD-10-CM | POA: Insufficient documentation

## 2022-08-16 DIAGNOSIS — M86171 Other acute osteomyelitis, right ankle and foot: Secondary | ICD-10-CM | POA: Diagnosis not present

## 2022-08-16 DIAGNOSIS — Z79899 Other long term (current) drug therapy: Secondary | ICD-10-CM

## 2022-08-16 DIAGNOSIS — M868X7 Other osteomyelitis, ankle and foot: Secondary | ICD-10-CM | POA: Diagnosis present

## 2022-08-16 DIAGNOSIS — I129 Hypertensive chronic kidney disease with stage 1 through stage 4 chronic kidney disease, or unspecified chronic kidney disease: Secondary | ICD-10-CM | POA: Diagnosis not present

## 2022-08-16 DIAGNOSIS — Z8673 Personal history of transient ischemic attack (TIA), and cerebral infarction without residual deficits: Secondary | ICD-10-CM

## 2022-08-16 DIAGNOSIS — E1122 Type 2 diabetes mellitus with diabetic chronic kidney disease: Secondary | ICD-10-CM | POA: Diagnosis present

## 2022-08-16 DIAGNOSIS — Z7982 Long term (current) use of aspirin: Secondary | ICD-10-CM

## 2022-08-16 DIAGNOSIS — L97514 Non-pressure chronic ulcer of other 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: Secondary | ICD-10-CM | POA: Diagnosis present

## 2022-08-16 DIAGNOSIS — N179 Acute kidney failure, unspecified: Secondary | ICD-10-CM | POA: Diagnosis present

## 2022-08-16 DIAGNOSIS — N189 Chronic kidney disease, unspecified: Secondary | ICD-10-CM | POA: Diagnosis not present

## 2022-08-16 DIAGNOSIS — Z87891 Personal history of nicotine dependence: Secondary | ICD-10-CM

## 2022-08-16 DIAGNOSIS — M869 Osteomyelitis, unspecified: Secondary | ICD-10-CM | POA: Diagnosis present

## 2022-08-16 DIAGNOSIS — E785 Hyperlipidemia, unspecified: Secondary | ICD-10-CM | POA: Diagnosis present

## 2022-08-16 DIAGNOSIS — N2581 Secondary hyperparathyroidism of renal origin: Secondary | ICD-10-CM | POA: Diagnosis present

## 2022-08-16 DIAGNOSIS — I10 Essential (primary) hypertension: Secondary | ICD-10-CM | POA: Diagnosis present

## 2022-08-16 LAB — CBC WITH DIFFERENTIAL/PLATELET
Abs Immature Granulocytes: 0.03 10*3/uL (ref 0.00–0.07)
Basophils Absolute: 0 10*3/uL (ref 0.0–0.1)
Basophils Relative: 0 %
Eosinophils Absolute: 0.3 10*3/uL (ref 0.0–0.5)
Eosinophils Relative: 3 %
HCT: 36.5 % — ABNORMAL LOW (ref 39.0–52.0)
Hemoglobin: 12 g/dL — ABNORMAL LOW (ref 13.0–17.0)
Immature Granulocytes: 0 %
Lymphocytes Relative: 25 %
Lymphs Abs: 2.3 10*3/uL (ref 0.7–4.0)
MCH: 29.3 pg (ref 26.0–34.0)
MCHC: 32.9 g/dL (ref 30.0–36.0)
MCV: 89 fL (ref 80.0–100.0)
Monocytes Absolute: 0.9 10*3/uL (ref 0.1–1.0)
Monocytes Relative: 10 %
Neutro Abs: 5.5 10*3/uL (ref 1.7–7.7)
Neutrophils Relative %: 62 %
Platelets: 235 10*3/uL (ref 150–400)
RBC: 4.1 MIL/uL — ABNORMAL LOW (ref 4.22–5.81)
RDW: 13.9 % (ref 11.5–15.5)
WBC: 9.1 10*3/uL (ref 4.0–10.5)
nRBC: 0 % (ref 0.0–0.2)

## 2022-08-16 LAB — COMPREHENSIVE METABOLIC PANEL
ALT: 37 U/L (ref 0–44)
AST: 33 U/L (ref 15–41)
Albumin: 3.3 g/dL — ABNORMAL LOW (ref 3.5–5.0)
Alkaline Phosphatase: 68 U/L (ref 38–126)
Anion gap: 9 (ref 5–15)
BUN: 44 mg/dL — ABNORMAL HIGH (ref 8–23)
CO2: 23 mmol/L (ref 22–32)
Calcium: 9 mg/dL (ref 8.9–10.3)
Chloride: 112 mmol/L — ABNORMAL HIGH (ref 98–111)
Creatinine, Ser: 4.76 mg/dL — ABNORMAL HIGH (ref 0.61–1.24)
GFR, Estimated: 13 mL/min — ABNORMAL LOW (ref >60)
Glucose, Bld: 111 mg/dL — ABNORMAL HIGH (ref 70–99)
Potassium: 5.1 mmol/L (ref 3.5–5.1)
Sodium: 144 mmol/L (ref 135–145)
Total Bilirubin: 0.5 mg/dL (ref 0.3–1.2)
Total Protein: 7.2 g/dL (ref 6.5–8.1)

## 2022-08-16 LAB — LACTIC ACID, PLASMA: Lactic Acid, Venous: 1.4 mmol/L (ref 0.5–1.9)

## 2022-08-16 NOTE — ED Provider Triage Note (Signed)
Emergency Medicine Provider Triage Evaluation Note  Benjaman Artman , a 71 y.o. male  was evaluated in triage.  Pt complains of toe wound.  Sent in by PCP after having an MRI of his toes that shows osteomyelitis.  History of diabetes Review of Systems  Positive: Foot wound Negative: Fever  Physical Exam  BP (!) 144/74   Pulse 93   Temp 98.9 F (37.2 C) (Oral)   Resp 16   SpO2 95%  Gen:   Awake, no distress   Resp:  Normal effort  MSK:   Moves extremities without difficulty  Other:    Medical Decision Making  Medically screening exam initiated at 9:36 PM.  Appropriate orders placed.  Ernst Cumpston was informed that the remainder of the evaluation will be completed by another provider, this initial triage assessment does not replace that evaluation, and the importance of remaining in the ED until their evaluation is complete.  Right foot osteomyelitis   Margarita Mail, PA-C 08/16/22 2139

## 2022-08-16 NOTE — ED Triage Notes (Signed)
Pt had outpatient MRI showing infection in R big toe. Pt reports wearing the wrong shoes one day and a blister came up on his toe.

## 2022-08-17 ENCOUNTER — Encounter (HOSPITAL_COMMUNITY): Payer: Self-pay | Admitting: Internal Medicine

## 2022-08-17 ENCOUNTER — Ambulatory Visit: Payer: No Typology Code available for payment source | Admitting: Podiatry

## 2022-08-17 ENCOUNTER — Encounter (HOSPITAL_COMMUNITY): Payer: No Typology Code available for payment source

## 2022-08-17 DIAGNOSIS — I159 Secondary hypertension, unspecified: Secondary | ICD-10-CM | POA: Diagnosis not present

## 2022-08-17 DIAGNOSIS — E1142 Type 2 diabetes mellitus with diabetic polyneuropathy: Secondary | ICD-10-CM

## 2022-08-17 DIAGNOSIS — M869 Osteomyelitis, unspecified: Secondary | ICD-10-CM | POA: Diagnosis not present

## 2022-08-17 DIAGNOSIS — M86671 Other chronic osteomyelitis, right ankle and foot: Secondary | ICD-10-CM | POA: Diagnosis not present

## 2022-08-17 DIAGNOSIS — N185 Chronic kidney disease, stage 5: Secondary | ICD-10-CM | POA: Diagnosis not present

## 2022-08-17 LAB — CBG MONITORING, ED: Glucose-Capillary: 181 mg/dL — ABNORMAL HIGH (ref 70–99)

## 2022-08-17 LAB — GLUCOSE, CAPILLARY
Glucose-Capillary: 129 mg/dL — ABNORMAL HIGH (ref 70–99)
Glucose-Capillary: 171 mg/dL — ABNORMAL HIGH (ref 70–99)

## 2022-08-17 MED ORDER — INSULIN ASPART 100 UNIT/ML IJ SOLN
0.0000 [IU] | Freq: Three times a day (TID) | INTRAMUSCULAR | Status: DC
  Administered 2022-08-17: 2 [IU] via SUBCUTANEOUS
  Administered 2022-08-17 – 2022-08-18 (×2): 3 [IU] via SUBCUTANEOUS
  Administered 2022-08-18 – 2022-08-20 (×3): 2 [IU] via SUBCUTANEOUS

## 2022-08-17 MED ORDER — SODIUM CHLORIDE 0.9% FLUSH: 3.0000 mL | INTRAVENOUS | Status: DC | PRN

## 2022-08-17 MED ORDER — ATORVASTATIN CALCIUM 80 MG PO TABS
80.0000 mg | ORAL_TABLET | Freq: Every day | ORAL | Status: DC
  Administered 2022-08-17 – 2022-08-20 (×4): 80 mg via ORAL
  Filled 2022-08-17: qty 2
  Filled 2022-08-17 (×3): qty 1

## 2022-08-17 MED ORDER — TRAMADOL HCL 50 MG PO TABS
50.0000 mg | ORAL_TABLET | Freq: Three times a day (TID) | ORAL | Status: DC | PRN
  Administered 2022-08-19 – 2022-08-20 (×2): 50 mg via ORAL
  Filled 2022-08-17 (×2): qty 1

## 2022-08-17 MED ORDER — AMLODIPINE BESYLATE 10 MG PO TABS
10.0000 mg | ORAL_TABLET | Freq: Every day | ORAL | Status: DC
  Administered 2022-08-17 – 2022-08-20 (×4): 10 mg via ORAL
  Filled 2022-08-17 (×3): qty 1
  Filled 2022-08-17: qty 2

## 2022-08-17 MED ORDER — VANCOMYCIN HCL 1750 MG/350ML IV SOLN
1750.0000 mg | Freq: Once | INTRAVENOUS | Status: AC
  Administered 2022-08-17: 1750 mg via INTRAVENOUS
  Filled 2022-08-17: qty 350

## 2022-08-17 MED ORDER — ORAL CARE MOUTH RINSE: 15.0000 mL | OROMUCOSAL | Status: DC | PRN

## 2022-08-17 MED ORDER — TORSEMIDE 20 MG PO TABS
20.0000 mg | ORAL_TABLET | Freq: Every day | ORAL | Status: DC
  Administered 2022-08-17 – 2022-08-18 (×2): 20 mg via ORAL
  Filled 2022-08-17 (×2): qty 1

## 2022-08-17 MED ORDER — SODIUM CHLORIDE 0.9% FLUSH
3.0000 mL | Freq: Two times a day (BID) | INTRAVENOUS | Status: DC
  Administered 2022-08-17 – 2022-08-20 (×6): 3 mL via INTRAVENOUS

## 2022-08-17 MED ORDER — VANCOMYCIN VARIABLE DOSE PER UNSTABLE RENAL FUNCTION (PHARMACIST DOSING): Status: DC

## 2022-08-17 MED ORDER — ASPIRIN 81 MG PO TBEC
81.0000 mg | DELAYED_RELEASE_TABLET | Freq: Every day | ORAL | Status: DC
  Administered 2022-08-17 – 2022-08-20 (×4): 81 mg via ORAL
  Filled 2022-08-17 (×4): qty 1

## 2022-08-17 MED ORDER — DOCUSATE SODIUM 100 MG PO CAPS
100.0000 mg | ORAL_CAPSULE | Freq: Two times a day (BID) | ORAL | Status: DC
  Administered 2022-08-17 – 2022-08-20 (×7): 100 mg via ORAL
  Filled 2022-08-17 (×7): qty 1

## 2022-08-17 MED ORDER — CALCITRIOL 0.25 MCG PO CAPS
0.2500 ug | ORAL_CAPSULE | Freq: Every day | ORAL | Status: DC
  Administered 2022-08-17 – 2022-08-20 (×4): 0.25 ug via ORAL
  Filled 2022-08-17 (×4): qty 1

## 2022-08-17 MED ORDER — HEPARIN SODIUM (PORCINE) 5000 UNIT/ML IJ SOLN
5000.0000 [IU] | Freq: Three times a day (TID) | INTRAMUSCULAR | Status: DC
  Administered 2022-08-17 – 2022-08-20 (×6): 5000 [IU] via SUBCUTANEOUS
  Filled 2022-08-17 (×6): qty 1

## 2022-08-17 MED ORDER — SODIUM CHLORIDE 0.9 % IV SOLN: 250.0000 mL | INTRAVENOUS | Status: DC | PRN

## 2022-08-17 MED ORDER — SODIUM CHLORIDE 0.9 % IV SOLN
2.0000 g | INTRAVENOUS | Status: DC
  Administered 2022-08-17: 2 g via INTRAVENOUS
  Filled 2022-08-17: qty 12.5

## 2022-08-17 NOTE — ED Notes (Signed)
Pt given warm blankets, [place] sandwich, sprite, and non skid socks. Denies any current needs at this time. Pt's wife at bedside.

## 2022-08-17 NOTE — Progress Notes (Addendum)
Pharmacy Antibiotic Note  [name] is a 71 y.o. male admitted on 08/16/2022 with  osteomyelitis .  Pharmacy has been consulted for cefepime/vancomycin dosing. S/p courses of amoxicillin and Augmentin (and reportedly doxy) outpatient. 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  Plan: Vancomycin '1750mg'$  IV x 1; then trend renal function and may consider checking vancomycin level in 48 hrs prior to re-dosing Cefepime 2g IV q24h Monitor clinical progress, c/s, renal function F/u de-escalation plan/LOT, vancomycin levels as indicated     Temp (24hrs), Avg:98.2 F (36.8 C), Min:97.8 F (36.6 C), Max:98.9 F (37.2 C)  Recent Labs  Lab 08/16/22 2146  WBC 9.1  CREATININE 4.76*  LATICACIDVEN 1.4    Estimated Creatinine Clearance: 16.1 mL/min (A) (by C-G formula based on SCr of 4.76 mg/dL (H)).    Allergies  Allergen Reactions   Losartan Potassium     Other reaction(s): hyperkalemia   Sildenafil     Other reaction(s): not effective    [name] Morton, PharmD, BCPS Please check AMION for all [facility] contact numbers Clinical Pharmacist 08/17/2022 8:45 AM

## 2022-08-17 NOTE — Assessment & Plan Note (Signed)
Creatinine at baseline. He is followed by nephrology and is being prepared for dialysis  Plan Continue present regimen

## 2022-08-17 NOTE — ED Notes (Signed)
IV attempts unsuccessful x2. Another RN to attempt. Pt updated on plan of care.

## 2022-08-17 NOTE — ED Notes (Signed)
Pt ambulatory in lobby.

## 2022-08-17 NOTE — Assessment & Plan Note (Signed)
Last A1C 7.7% 07/11/22.  Plan Trulicity on Sundays  Coverage with sliding scale.

## 2022-08-17 NOTE — Assessment & Plan Note (Signed)
BP mildly elevated. Per PCP notes generally controlled.  Plan Continue home meds

## 2022-08-17 NOTE — ED Provider Notes (Signed)
[facility] EMERGENCY DEPARTMENT Provider Note   CSN: [id number] Arrival date & time: 08/16/22  1922     History  Chief Complaint  Patient presents with   Foot Pain    [name] is a 71 y.o. male.   Foot Pain     Patient has history of hypertension diabetes reflux, chronic kidney disease, stroke, hyperlipidemia who presents with a worsening toe infection.  Patient states he has had a wound on his toe for several weeks.  He has been seeing podiatry.  Patient has been treated with antibiotics any feels like it seems to be getting better.  However he has had persistent drainage and malodor to the wound.  Patient saw his doctor on November 10.  He had an outpatient MRI.  The study yesterday ended up showing development of osteomyelitis.  Patient was sent to the ED for further treatment.  He denies any trouble with any fevers or chills.  Home Medications Prior to Admission medications   Medication Sig Start Date End Date Taking? Authorizing Provider  amLODipine (NORVASC) 10 MG tablet Take 1 tablet by mouth daily.    [provider]  amoxicillin-clavulanate (AUGMENTIN) 875-125 MG tablet Take 1 tablet by mouth 2 (two) times daily for 14 days. Patient not taking: Reported on 08/16/2022 08/03/22 08/17/22  Standiford, Nena Alexander, DPM  aspirin EC 81 MG tablet Take 81 mg by mouth daily. Swallow whole.    [provider]  atorvastatin (LIPITOR) 80 MG tablet Take 1 tablet by mouth every evening.    [provider]  Blood Glucose Monitoring Suppl (TRUE METRIX METER) w/Device KIT  07/12/18   [provider]  calcitRIOL (ROCALTROL) 0.25 MCG capsule Take 0.25 mcg by mouth daily. 05/22/22   [provider]  docusate sodium (COLACE) 100 MG capsule Take 200 mg by mouth daily.    [provider]  Dulaglutide 4.5 MG/0.5ML SOPN Inject 4.5 mg into the skin every Wednesday.    [provider]  glucose blood test strip  daily. 01/27/16   [provider]  Lancets [facility] ULTRASOFT) lancets daily. 02/02/16   [provider]  NOVOLOG MIX 70/30 FLEXPEN (70-30) 100 UNIT/ML FlexPen Inject 32 Units into the skin 2 (two) times daily with a meal. 12/17/19   [provider]  pregabalin (LYRICA) 75 MG capsule  06/02/21   [provider]  torsemide (DEMADEX) 20 MG tablet Take 20 mg by mouth daily. 08/04/20   [provider]  traMADol Veatrice Bourbon) 50 MG tablet  06/02/21   [provider]  ULTICARE MINI PEN NEEDLES 31G X 6 MM [place]  08/23/18   [provider]      Allergies    Losartan potassium and Sildenafil    Review of Systems   Review of Systems  Physical Exam Updated Vital Signs BP (!) 152/69 (BP Location: Right Arm)   Pulse 81   Temp 97.8 F (36.6 C)   Resp 18   SpO2 99%  Physical Exam Vitals and nursing note reviewed.  Constitutional:      General: He is not in acute distress.    Appearance: He is well-developed.  HENT:     Head: Normocephalic and atraumatic.     Right Ear: External ear normal.     Left Ear: External ear normal.  Eyes:     General: No scleral icterus.       Right eye: No discharge.        Left eye: No  discharge.     Conjunctiva/sclera: Conjunctivae normal.  Neck:     Trachea: No tracheal deviation.  Cardiovascular:     Rate and Rhythm: Normal rate and regular rhythm.  Pulmonary:     Effort: Pulmonary effort is normal. No respiratory distress.     Breath sounds: Normal breath sounds. No stridor.  Abdominal:     General: There is no distension.  Musculoskeletal:        General: Swelling present. No deformity.     Cervical back: Neck supple.     Comments: 2 ulcerated wounds of the right big toe, malodorous drainage, no lymphangitic streaking  Skin:    General: Skin is warm and dry.     Findings: No rash.  Neurological:     Mental Status: He is alert.     Cranial Nerves: Cranial nerve deficit: no gross deficits.      ED Results / Procedures / Treatments   Labs (all labs ordered are listed, but only abnormal results are displayed) Labs Reviewed  COMPREHENSIVE METABOLIC PANEL - Abnormal; Notable for the following components:      Result Value   Chloride 112 (*)    Glucose, Bld 111 (*)    BUN 44 (*)    Creatinine, Ser 4.76 (*)    Albumin 3.3 (*)    GFR, Estimated 13 (*)    All other components within normal limits  CBC WITH DIFFERENTIAL/PLATELET - Abnormal; Notable for the following components:   RBC 4.10 (*)    Hemoglobin 12.0 (*)    HCT 36.5 (*)    All other components within normal limits  CULTURE, BLOOD (ROUTINE X 2)  CULTURE, BLOOD (ROUTINE X 2)  LACTIC ACID, PLASMA    EKG None  Radiology MR TOES RIGHT WO CONTRAST  Result Date: 08/16/2022 CLINICAL DATA:  Diabetic foot wound.  Right great toe wound. EXAM: MRI OF THE RIGHT TOES WITHOUT CONTRAST TECHNIQUE: Multiplanar, multisequence MR imaging of the right forefoot was performed. No intravenous contrast was administered. COMPARISON:  Radiographs 07/11/2022 FINDINGS: Bones/Joint/Cartilage Skin ulceration is noted along the dorsal and medial aspect of the great toe at the level of the interphalangeal joint. There is cortical destruction of the distal 1st phalanx associated with diffusely increased T2 and decreased T1 signal, consistent with osteomyelitis. There are similar marrow changes within the medial head of the 1st proximal phalanx. No significant intervening joint effusion. Moderate arthritic changes are present at the 1st metatarsophalangeal joint with joint space narrowing, osteophytes and erosions, possibly due to gout based on previous radiographs. The additional toes and metatarsals appear normal. The alignment appears normal at the Lisfranc joint. Ligaments The Lisfranc ligament and collateral ligaments of the metatarsophalangeal joints are intact. Muscles and Tendons Generalized forefoot muscular atrophy and mild T2 hyperintensity  attributed to the patient's diabetes. No tendon rupture or significant tenosynovitis. Soft tissues Soft tissue ulceration of the great toe dorsally as described above. No focal fluid collection. Mild nonspecific diffuse dorsal subcutaneous edema. IMPRESSION: 1. Soft tissue ulceration along the dorsal and medial aspect of the great toe with associated osteomyelitis of the distal 1st phalanx and medial head of the 1st proximal phalanx. 2. Moderate arthritic changes at the 1st metatarsophalangeal joint with joint space narrowing, osteophytes and erosions, possibly due to gout based on previous radiographs. 3. No evidence of soft tissue abscess. Electronically Signed   By: Richardean Sale M.D.   On: 08/16/2022 15:13    Procedures Procedures    Medications Ordered in ED Medications -  No data to display  ED Course/ Medical Decision Making/ A&P Clinical Course as of 08/21/22 0759  Thu Aug 17, 2022  0828 Comprehensive metabolic panel(!) Creatinine elevated creatinine elevated, increased compared to previous values [JK]  0829 CBC with Differential(!) No leukocytosis [JK]  0829 Lactic acid Normal [JK]  0908 Case discussed with Dr. Linda Hedges regarding admission [JK]    Clinical Course User Index [JK] Dorie Rank, MD                           Medical Decision Making Problems Addressed: Chronic kidney disease, unspecified CKD stage: chronic illness or injury Osteomyelitis of toe of right foot [facility]): acute illness or injury that poses a threat to life or bodily functions  Amount and/or Complexity of Data Reviewed External Data Reviewed: radiology and notes.    Details: Reviewed outpatient notes including the recent MRI result Labs: ordered. Decision-making details documented in ED Course.  Risk Prescription drug management. Drug therapy requiring intensive monitoring for toxicity. Decision regarding hospitalization.   Patient presents ED for evaluation of right toe osteomyelitis.  Patient will  require hospitalization and IV antibiotics for further treatment.  At this time he is nontoxic and does not have any signs of systemic infection, sepsis.  IV antibiotics have been ordered.  I will consult the medical service for admission.        Final Clinical Impression(s) / ED Diagnoses Final diagnoses:  Osteomyelitis of toe of right foot ([place])  Chronic kidney disease, unspecified CKD stage    Rx / DC Orders ED Discharge Orders     None         Dorie Rank, MD 08/21/22 0800

## 2022-08-17 NOTE — Consult Note (Signed)
PODIATRY CONSULTATION  NAME [name] MRN [id number] DOB [birth year] DOA 08/16/2022   Reason for consult:  Chief Complaint  Patient presents with   Foot Pain   History of present illness: 71 y.o. 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  MRI revealing osteomyelitis of the toe.  Patient not interested in any surgery for the moment.  Presenting this evening resting comfortably with his spouse.   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 due to underlying condition, limited to breakdown of skin ([place])    left   GERD (gastroesophageal reflux disease)    Hyperlipidemia    Hypertension    Non compliance w medication regimen    Secondary hyperparathyroidism of renal origin [facility])    per nephrology lov dr Candiss Norse 05-22-2022   Stroke ([place]) 2016   no deficits       Latest Ref Rng & Units 08/16/2022    9:46 PM 05/12/2022    9:01 AM 03/15/2022    2:09 PM  CBC  WBC 4.0 - 10.5 K/uL 9.1  12.1  9.3   Hemoglobin 13.0 - 17.0 g/dL 12.0  12.9  12.1   Hematocrit 39.0 - 52.0 % 36.5  39.4  38.3   Platelets 150 - 400 K/uL 235  215  198        Latest Ref Rng & Units 08/16/2022    9:46 PM 05/12/2022    9:01 AM 03/15/2022    2:09 PM  BMP  Glucose 70 - 99 mg/dL 111  140  391   BUN 8 - 23 mg/dL 44  53  45   Creatinine 0.61 - 1.24 mg/dL 4.76  3.95  3.88   BUN/Creat Ratio 6 - 22 (calc)  13  12   Sodium 135 - 145 mmol/L 144  141  138   Potassium 3.5 - 5.1 mmol/L 5.1  4.4  4.8   Chloride 98 - 111 mmol/L 112  107  106   CO2 22 - 32 mmol/L '23  26  23   '$ Calcium 8.9 - 10.3 mg/dL 9.0  9.3  8.4       Physical Exam: General: The patient is alert and oriented x3 in no acute distress.   Dermatology: Ulcer noted to the right  great toe.  No malodor.  Scant serous drainage.  Periwound is callused.  Vascular: VAS [place] ABI W/WO TBI 10/10/2021 Summary: ABI Findings: Right Rt Pressure (mmHg) Index Waveform Comment Brachial 177 triphasic PTA 182 1.03 triphasic DP 152 0.86 triphasic Left Lt Pressure (mmHg) Index Waveform Comment Brachial 167 triphasic PTA 179 1.01 biphasic DP 145 0.82 biphasic Great Toe great toe amputation ABI/TBI Today's ABI Today's TBI Previous ABI Previous TBI Right 1.03 Left 1.01 Right: Resting right ankle-brachial index is within normal range. No evidence of significant right lower extremity arterial disease. Left: Resting left ankle-brachial index is within normal range. No evidence of significant left lower extremity arterial disease.  Neurological: Light touch and protective threshold diminished bilaterally.   Musculoskeletal Exam: Prior amputation left foot/great toe  MR TOES RT WO CONTRAST 08/16/2022 IMPRESSION: 1. Soft tissue ulceration along the dorsal and medial aspect of the great toe with associated osteomyelitis of the distal 1st phalanx  and medial head of the 1st proximal phalanx. 2. Moderate arthritic changes at the 1st metatarsophalangeal joint with joint space narrowing, osteophytes and erosions, possibly due to gout based on previous radiographs. 3. No evidence of soft tissue abscess.  ASSESSMENT/PLAN OF CARE Osteomyelitis right great toe -Patient adamant that he does not want to have amputation of the toe -Continue Betadine wet-to-dry dressings to the toe -Patient and spouse state that infectious disease came earlier today and discussed antibiotic regimen outpatient -Podiatry to sign off.  Follow-up 1 week post discharge for local wound care     Please contact me directly with any questions or concerns via secure chat.   Edrick Kins, DPM [facility]  Dr. Edrick Kins, DPM    [address], [place] [address]                Office [phone]  Fax [phone]

## 2022-08-17 NOTE — Assessment & Plan Note (Signed)
Non-healing toe ulcer with osteomyelitis 1st proximal phalanx by MRI. Patient wants to avoid surgery if possible  Plan IV Vancomycin and Cefipeme  ID consult re: duration of treatment with recommendation for home care.

## 2022-08-17 NOTE — Assessment & Plan Note (Signed)
Stable Hgb by chart review. No intervention needed

## 2022-08-17 NOTE — ED Notes (Signed)
ED TO INPATIENT HANDOFF REPORT  ED Nurse Name and Phone #: Anasia Agro RN [phone]  S Name/Age/Gender [name] 71 y.o. male Room/Bed: H001C/H001C  Code Status   Code Status: Full Code  Home/SNF/Other Home Patient oriented to: self, place, time, and situation Is this baseline? Yes   Triage Complete: Triage complete  Chief Complaint Osteomyelitis of great toe of right foot ([place]) [M86.9]  Triage Note Pt had outpatient MRI showing infection in R big toe. Pt reports wearing the wrong shoes one day and a blister came up on his toe.    Allergies Allergies  Allergen Reactions   Losartan Potassium Other (See Comments)    hyperkalemia   Sildenafil Other (See Comments)    Not effective. "Did not like the way it made me feel"    Level of Care/Admitting Diagnosis ED Disposition     ED Disposition  [place]: [place] [[id number]]  Level of Care: Med-Surg [16]  May place patient in observation at [facility] or [place] if equivalent level of care is available:: Yes  Covid Evaluation: Asymptomatic - no recent exposure (last 10 days) testing not required  Diagnosis: Osteomyelitis of great toe of right foot [facility]) [[id number]]  Admitting Physician: Neena Rhymes [[id number]]  Attending Physician: Neena Rhymes [[id number]]          B Medical/Surgery History Past Medical History:  Diagnosis Date   Chronic kidney disease    stage 5   Diabetes mellitus without complication ([place])    Diabetic ulcer of foot associated with diabetes mellitus due to underlying condition, limited to breakdown of skin ([place])    left   GERD (gastroesophageal reflux disease)    Hyperlipidemia    Hypertension    Non compliance w medication regimen    Secondary hyperparathyroidism of renal origin [facility])    per nephrology lov dr Candiss Norse 05-22-2022   Stroke ([place]) 2016   no deficits   Past Surgical History:  Procedure Laterality Date   AMPUTATION OF  REPLICATED TOES Left 1324   left big toe, secont toe   APPENDECTOMY     BIOPSY  08/10/2020   Procedure: BIOPSY;  Surgeon: Harvel Quale, MD;  [facility];  Service: Gastroenterology;;   CHOLECYSTECTOMY     COLON SURGERY     COLONOSCOPY WITH PROPOFOL N/A 08/10/2020   Procedure: COLONOSCOPY WITH PROPOFOL;  Surgeon: Harvel Quale, MD;  [facility];  Service: Gastroenterology;  Laterality: N/A;  401   GRAFT APPLICATION Left 0/11/7251   Procedure: GRAFT APPLICATION;  Surgeon: [name], DPM;  Location: [place];  Service: Podiatry;  Laterality: Left;   HERNIA REPAIR     I & D EXTREMITY Left 10/10/2021   Procedure: IRRIGATION AND DEBRIDEMENT left foot;  Surgeon: Evelina Bucy, DPM;  Location: [place];  Service: Podiatry;  Laterality: Left;   IRRIGATION AND DEBRIDEMENT FOOT Left 10/16/2021   Procedure: IRRIGATION AND DEBRIDEMENT FOOT;  Surgeon: [name], DPM;  Location: [place];  Service: Podiatry;  Laterality: Left;  Abscess left heel   METATARSAL HEAD EXCISION Left 12/08/2019   Procedure: METATARSAL HEAD RECESSION THIRD LEFT;  Surgeon: [name], DPM;  Location: [place];  Service: Podiatry;  Laterality: Left;   POLYPECTOMY  08/10/2020   Procedure: POLYPECTOMY;  Surgeon: Harvel Quale, MD;  [facility];  Service: Gastroenterology;;   WOUND DEBRIDEMENT Left 12/08/2019  Procedure: DEBRIDEMENT WOUND;  Surgeon: [name], DPM;  Location: [place];  Service: Podiatry;  Laterality: Left;   WOUND DEBRIDEMENT Left 10/12/2021   Procedure: LEFT FOOT WOUND DEBRIDEMENT AND IRRIGATION, 1ST METATARSAL RESECTION;  Surgeon: Evelina Bucy, DPM;  Location: [place];  Service: Podiatry;  Laterality: Left;     A IV Location/Drains/Wounds Patient Lines/Drains/Airways Status     Active Line/Drains/Airways     Name Placement date Placement time Site Days   Peripheral IV  08/17/22 22 G 1" Posterior;Right Hand 08/17/22  1044  Hand  less than 1   Incision (Closed) 10/12/21 Foot Left 10/12/21  1059  -- 309   Incision (Closed) 10/16/21 Foot Left 10/16/21  1119  -- 305   Wound / Incision (Open or Dehisced) 10/10/21 Diabetic ulcer Heel Left deep red purulent drainage 10/10/21  2000  Heel  311            Intake/Output Last 24 hours  Intake/Output Summary (Last 24 hours) at 08/17/2022 1340 Last data filed at 08/17/2022 1142 Gross per 24 hour  Intake 100 ml  Output --  Net 100 ml    Labs/Imaging Results for orders placed or performed during the hospital encounter of 08/16/22 (from the past 48 hour(s))  Comprehensive metabolic panel     Status: Abnormal   Collection Time: 08/16/22  9:46 PM  Result Value Ref Range   Sodium 144 135 - 145 mmol/L   Potassium 5.1 3.5 - 5.1 mmol/L   Chloride 112 (H) 98 - 111 mmol/L   CO2 23 22 - 32 mmol/L   Glucose, Bld 111 (H) 70 - 99 mg/dL    Comment: Glucose reference range applies only to samples taken after fasting for at least 8 hours.   BUN 44 (H) 8 - 23 mg/dL   Creatinine, Ser 4.76 (H) 0.61 - 1.24 mg/dL   Calcium 9.0 8.9 - 10.3 mg/dL   Total Protein 7.2 6.5 - 8.1 g/dL   Albumin 3.3 (L) 3.5 - 5.0 g/dL   AST 33 15 - 41 U/L   ALT 37 0 - 44 U/L   Alkaline Phosphatase 68 38 - 126 U/L   Total Bilirubin 0.5 0.3 - 1.2 mg/dL   GFR, Estimated 13 (L) >60 mL/min    Comment: (NOTE) Calculated using the CKD-EPI Creatinine Equation (2021)    Anion gap 9 5 - 15    Comment: Performed at [facility] [address] [place], [place] [address]  CBC with Differential     Status: Abnormal   Collection Time: 08/16/22  9:46 PM  Result Value Ref Range   WBC 9.1 4.0 - 10.5 K/uL   RBC 4.10 (L) 4.22 - 5.81 MIL/uL   Hemoglobin 12.0 (L) 13.0 - 17.0 g/dL   HCT 36.5 (L) 39.0 - 52.0 %   MCV 89.0 80.0 - 100.0 fL   MCH 29.3 26.0 - 34.0 pg   MCHC 32.9 30.0 - 36.0 g/dL   RDW 13.9 11.5 - 15.5 %   Platelets 235 150 - 400 K/uL    nRBC 0.0 0.0 - 0.2 %   Neutrophils Relative % 62 %   Neutro Abs 5.5 1.7 - 7.7 K/uL   Lymphocytes Relative 25 %   Lymphs Abs 2.3 0.7 - 4.0 K/uL   Monocytes Relative 10 %   Monocytes Absolute 0.9 0.1 - 1.0 K/uL   Eosinophils Relative 3 %   Eosinophils Absolute 0.3 0.0 - 0.5 K/uL   Basophils Relative 0 %  Basophils Absolute 0.0 0.0 - 0.1 K/uL   Immature Granulocytes 0 %   Abs Immature Granulocytes 0.03 0.00 - 0.07 K/uL    Comment: Performed at [facility], [place] [address] [place], [place] [address]  Lactic acid     Status: None   Collection Time: 08/16/22  9:46 PM  Result Value Ref Range   Lactic Acid, Venous 1.4 0.5 - 1.9 mmol/L    Comment: Performed at [facility] [address] [place], [place] [address]  Blood Cultures x 2 sites     Status: None (Preliminary result)   Collection Time: 08/16/22  9:46 PM   Specimen: BLOOD  Result Value Ref Range   Specimen Description BLOOD LEFT ANTECUBITAL    Special Requests      BOTTLES DRAWN AEROBIC AND ANAEROBIC Blood Culture adequate volume   Culture      NO GROWTH < 12 HOURS Performed at [facility], [place] [address], [place], [place] [address]    Report Status PENDING   CBG monitoring, ED     Status: Abnormal   Collection Time: 08/17/22 12:11 PM  Result Value Ref Range   Glucose-Capillary 181 (H) 70 - 99 mg/dL    Comment: Glucose reference range applies only to samples taken after fasting for at least 8 hours.   MR TOES RIGHT WO CONTRAST  Result Date: 08/16/2022 CLINICAL DATA:  Diabetic foot wound.  Right great toe wound. EXAM: MRI OF THE RIGHT TOES WITHOUT CONTRAST TECHNIQUE: Multiplanar, multisequence MR imaging of the right forefoot was performed. No intravenous contrast was administered. COMPARISON:  Radiographs 07/11/2022 FINDINGS: Bones/Joint/Cartilage Skin ulceration is noted along the dorsal and medial aspect of the great toe at the level of the interphalangeal joint. There is cortical destruction of the  distal 1st phalanx associated with diffusely increased T2 and decreased T1 signal, consistent with osteomyelitis. There are similar marrow changes within the medial head of the 1st proximal phalanx. No significant intervening joint effusion. Moderate arthritic changes are present at the 1st metatarsophalangeal joint with joint space narrowing, osteophytes and erosions, possibly due to gout based on previous radiographs. The additional toes and metatarsals appear normal. The alignment appears normal at the Lisfranc joint. Ligaments The Lisfranc ligament and collateral ligaments of the metatarsophalangeal joints are intact. Muscles and Tendons Generalized forefoot muscular atrophy and mild T2 hyperintensity attributed to the patient's diabetes. No tendon rupture or significant tenosynovitis. Soft tissues Soft tissue ulceration of the great toe dorsally as described above. No focal fluid collection. Mild nonspecific diffuse dorsal subcutaneous edema. IMPRESSION: 1. Soft tissue ulceration along the dorsal and medial aspect of the great toe with associated osteomyelitis of the distal 1st phalanx and medial head of the 1st proximal phalanx. 2. Moderate arthritic changes at the 1st metatarsophalangeal joint with joint space narrowing, osteophytes and erosions, possibly due to gout based on previous radiographs. 3. No evidence of soft tissue abscess. Electronically Signed   By: Richardean Sale M.D.   On: 08/16/2022 15:13    Pending Labs Unresulted Labs (From admission, onward)     Start     Ordered   08/16/22 2136  Blood Cultures x 2 sites  BLOOD CULTURE X 2,   STAT      08/16/22 2136            Vitals/Pain Today's Vitals   08/16/22 2128 08/17/22 0103 08/17/22 0418 08/17/22 1113  BP:  137/72 (!) 152/69 (!) 157/78  Pulse:  87 81 83  Resp:  '18 18 16  '$ Temp:  98 F (36.7 C) 97.8 F (36.6 C) (!) 97.5 F (36.4 C)  TempSrc:  Oral  Oral  SpO2:  99% 99% 100%  PainSc: 2    0-No pain    Isolation  Precautions No active isolations  Medications Medications  vancomycin (VANCOREADY) IVPB 1750 mg/350 mL (1,750 mg Intravenous New Bag/Given 08/17/22 1230)  vancomycin variable dose per unstable renal function (pharmacist dosing) (has no administration in time range)  ceFEPIme (MAXIPIME) 2 g in sodium chloride 0.9 % 100 mL IVPB (0 g Intravenous Stopped 08/17/22 1142)  aspirin EC tablet 81 mg (81 mg Oral Given 08/17/22 1223)  amLODipine (NORVASC) tablet 10 mg (10 mg Oral Given 08/17/22 1223)  atorvastatin (LIPITOR) tablet 80 mg (80 mg Oral Given 08/17/22 1224)  torsemide (DEMADEX) tablet 20 mg (20 mg Oral Given 08/17/22 1223)  calcitRIOL (ROCALTROL) capsule 0.25 mcg (0.25 mcg Oral Given 08/17/22 1223)  docusate sodium (COLACE) capsule 100 mg (100 mg Oral Given 08/17/22 1223)  insulin aspart (novoLOG) injection 0-15 Units (3 Units Subcutaneous Given 08/17/22 1222)  heparin injection 5,000 Units (has no administration in time range)  sodium chloride flush (NS) 0.9 % injection 3 mL (3 mLs Intravenous Given 08/17/22 1224)  sodium chloride flush (NS) 0.9 % injection 3 mL (has no administration in time range)  0.9 %  sodium chloride infusion (has no administration in time range)  traMADol (ULTRAM) tablet 50 mg (has no administration in time range)    Mobility walks Low fall risk   Focused Assessments Cardiac Assessment Handoff:    Lab Results  Component Value Date   CKTOTAL 53 10/14/2021   No results found for: "DDIMER" Does the Patient currently have chest pain? No   , Pulmonary Assessment Handoff:  Lung sounds:   O2 Device: Room Air      R Recommendations: See Admitting Provider Note  Report given to:   Additional Notes:

## 2022-08-17 NOTE — Subjective & Objective (Signed)
[name], a 71 y/o with DM on insulin, HLD, HTN, CKD 5, prior h/o toe/foot infections requiring amputation has had a non-healing right great toe ulcer for 3 weeks followed by [facility] and his PCP Dr. Lacinda Axon. Due to non-healing despite oral abx he had outpatient MRI right foot revealing osteomyelitis medial head and 1st proximal phalanx right great toe. Podiatry referred him top [facility] for admission for IV abx.

## 2022-08-17 NOTE — ED Notes (Signed)
2W contacted at this time to initiate purple man process.

## 2022-08-17 NOTE — H&P (Signed)
History and Physical    [name] ZJI:[id number] DOB: 01-Jun-1951 DOA: 08/16/2022  DOS: the patient was seen and examined on 08/16/2022  PCP: Coral Spikes, DO   Patient coming from: Home  I have personally briefly reviewed patient's old medical records in [facility]  Mr. Kleinert, a 71 y/o with DM on insulin, HLD, HTN, CKD 5, prior h/o toe/foot infections requiring amputation has had a non-healing right great toe ulcer for 3 weeks followed by [facility] and his PCP Dr. Lacinda Axon. Due to non-healing despite oral abx he had outpatient MRI right foot revealing osteomyelitis medial head and 1st proximal phalanx right great toe. Podiatry referred him [facility] for admission for IV abx.    ED Course: afebrile. 152/89  HR 81. Lab: Glucose 11, BUN/Cr  44/476  Lactic acid 1.4. WBC 9.1 with nl Diff. [facility] called to admit for IV abx. VAnc and Cefepime started in ED  Review of Systems:  Review of Systems  Constitutional:  Negative for chills, fever and weight loss.  HENT: Negative.    Eyes: Negative.   Cardiovascular: Negative.   Gastrointestinal: Negative.   Genitourinary: Negative.   Musculoskeletal: Negative.   Skin:        Ulcerated lesion right great toe  Neurological: Negative.   Endo/Heme/Allergies: Negative.   Psychiatric/Behavioral: Negative.      Past Medical History:  Diagnosis Date   Chronic kidney disease    stage 5   Diabetes mellitus without complication ([place])    Diabetic ulcer of foot associated with diabetes mellitus due to underlying condition, limited to breakdown of skin ([place])    left   GERD (gastroesophageal reflux disease)    Hyperlipidemia    Hypertension    Non compliance w medication regimen    Secondary hyperparathyroidism of renal origin [facility])    per nephrology lov dr Candiss Norse 05-22-2022   Stroke [facility]) 2016   no deficits    Past Surgical History:  Procedure Laterality Date   AMPUTATION OF REPLICATED TOES Left 1751   left big toe, secont toe    APPENDECTOMY     BIOPSY  08/10/2020   Procedure: BIOPSY;  Surgeon: Harvel Quale, MD;  [facility];  Service: Gastroenterology;;   CHOLECYSTECTOMY     COLON SURGERY     COLONOSCOPY WITH PROPOFOL N/A 08/10/2020   Procedure: COLONOSCOPY WITH PROPOFOL;  Surgeon: Harvel Quale, MD;  [facility];  Service: Gastroenterology;  Laterality: N/A;  025   GRAFT APPLICATION Left 05/06/2777   Procedure: GRAFT APPLICATION;  Surgeon: [name], DPM;  Location: [place];  Service: Podiatry;  Laterality: Left;   HERNIA REPAIR     I & D EXTREMITY Left 10/10/2021   Procedure: IRRIGATION AND DEBRIDEMENT left foot;  Surgeon: Evelina Bucy, DPM;  Location: [place];  Service: Podiatry;  Laterality: Left;   IRRIGATION AND DEBRIDEMENT FOOT Left 10/16/2021   Procedure: IRRIGATION AND DEBRIDEMENT FOOT;  Surgeon: [name], DPM;  Location: [place];  Service: Podiatry;  Laterality: Left;  Abscess left heel   METATARSAL HEAD EXCISION Left 12/08/2019   Procedure: METATARSAL HEAD RECESSION THIRD LEFT;  Surgeon: [name], DPM;  Location: [place];  Service: Podiatry;  Laterality: Left;   POLYPECTOMY  08/10/2020   Procedure: POLYPECTOMY;  Surgeon: Harvel Quale, MD;  [facility];  Service: Gastroenterology;;   WOUND DEBRIDEMENT Left 12/08/2019   Procedure: DEBRIDEMENT WOUND;  Surgeon: [name], DPM;  Location: [place];  Service: Podiatry;  Laterality: Left;   WOUND DEBRIDEMENT Left 10/12/2021   Procedure: LEFT FOOT WOUND DEBRIDEMENT AND IRRIGATION, 1ST METATARSAL RESECTION;  Surgeon: Evelina Bucy, DPM;  Location: [place];  Service: Podiatry;  Laterality: Left;    Soc hx - married 21 years, together 49. Combined 5 children, 13 grands, several greats. Lives with spouse, I-ADLs. Retired - was Camera operator of all trades.    reports that he has quit smoking. He has never used smokeless tobacco. He  reports that he does not currently use alcohol. He reports that he does not use drugs.  Allergies  Allergen Reactions   Losartan Potassium Other (See Comments)    hyperkalemia   Sildenafil Other (See Comments)    Not effective. "Did not like the way it made me feel"    History reviewed. No pertinent family history.  Prior to Admission medications   Medication Sig Start Date End Date Taking? Authorizing Provider  amLODipine (NORVASC) 10 MG tablet Take 10 mg by mouth daily.   Yes [provider]  aspirin EC 81 MG tablet Take 81 mg by mouth daily. Swallow whole.   Yes [provider]  atorvastatin (LIPITOR) 80 MG tablet Take 80 mg by mouth daily.   Yes [provider]  calcitRIOL (ROCALTROL) 0.25 MCG capsule Take 0.25 mcg by mouth daily. 05/22/22  Yes [provider]  docusate sodium (COLACE) 100 MG capsule Take 100 mg by mouth 2 (two) times daily.   Yes [provider]  Dulaglutide 4.5 MG/0.5ML SOPN Inject 4.5 mg into the skin every Sunday.   Yes [provider]  NOVOLOG MIX 70/30 FLEXPEN (70-30) 100 UNIT/ML FlexPen Inject 32-34 Units into the skin 2 (two) times daily with a meal. 12/17/19  Yes [provider]  torsemide (DEMADEX) 20 MG tablet Take 20 mg by mouth daily. 08/04/20  Yes [provider]  amoxicillin-clavulanate (AUGMENTIN) 875-125 MG tablet Take 1 tablet by mouth 2 (two) times daily for 14 days. Patient not taking: Reported on 08/16/2022 08/03/22 08/17/22  Yevonne Pax, DPM    Physical Exam: Vitals:   08/16/22 2010 08/17/22 0103 08/17/22 0418  BP: (!) 144/74 137/72 (!) 152/69  Pulse: 93 87 81  Resp: '16 18 18  '$ Temp: 98.9 F (37.2 C) 98 F (36.7 C) 97.8 F (36.6 C)  TempSrc: Oral Oral   SpO2: 95% 99% 99%    Physical Exam Vitals and nursing note reviewed.  Constitutional:      General: He is not in acute distress.    Appearance: Normal appearance. He is normal weight. He is not  ill-appearing.  HENT:     Head: Normocephalic and atraumatic.     Mouth/Throat:     Mouth: Mucous membranes are moist.  Eyes:     Extraocular Movements: Extraocular movements intact.     Conjunctiva/sclera: Conjunctivae normal.     Pupils: Pupils are equal, round, and reactive to light.  Cardiovascular:     Rate and Rhythm: Normal rate and regular rhythm.     Pulses: Normal pulses.     Heart sounds: Normal heart sounds.  Pulmonary:     Effort: Pulmonary effort is normal.     Breath sounds: Normal breath sounds.  Abdominal:     General: Bowel sounds are normal. There is no distension.     Palpations: Abdomen is soft.     Tenderness: There is no abdominal tenderness.  Musculoskeletal:     Cervical back:  Normal range of motion and neck supple.     Comments: Dark ulcer right great toe-circumferential with no purulent drainage.  Lymphadenopathy:     Cervical: No cervical adenopathy.  Skin:    General: Skin is warm and dry.     Comments: Toe ulcer  Neurological:     General: No focal deficit present.     Mental Status: He is alert and oriented to person, place, and time.  Psychiatric:        Mood and Affect: Mood normal.      Labs on Admission: I have personally reviewed following labs and imaging studies  CBC: Recent Labs  Lab 08/16/22 2146  WBC 9.1  NEUTROABS 5.5  HGB 12.0*  HCT 36.5*  MCV 89.0  PLT 528   Basic Metabolic Panel: Recent Labs  Lab 08/16/22 2146  NA 144  K 5.1  CL 112*  CO2 23  GLUCOSE 111*  BUN 44*  CREATININE 4.76*  CALCIUM 9.0   GFR: Estimated Creatinine Clearance: 16.1 mL/min (A) (by C-G formula based on SCr of 4.76 mg/dL (H)). Liver Function Tests: Recent Labs  Lab 08/16/22 2146  AST 33  ALT 37  ALKPHOS 68  BILITOT 0.5  PROT 7.2  ALBUMIN 3.3*   No results for input(s): "LIPASE", "AMYLASE" in the last 168 hours. No results for input(s): "AMMONIA" in the last 168 hours. Coagulation Profile: No results for input(s): "INR",  "PROTIME" in the last 168 hours. Cardiac Enzymes: No results for input(s): "CKTOTAL", "CKMB", "CKMBINDEX", "TROPONINI" in the last 168 hours. BNP (last 3 results) No results for input(s): "PROBNP" in the last 8760 hours. HbA1C: No results for input(s): "HGBA1C" in the last 72 hours. CBG: No results for input(s): "GLUCAP" in the last 168 hours. Lipid Profile: No results for input(s): "CHOL", "HDL", "LDLCALC", "TRIG", "CHOLHDL", "LDLDIRECT" in the last 72 hours. Thyroid Function Tests: No results for input(s): "TSH", "T4TOTAL", "FREET4", "T3FREE", "THYROIDAB" in the last 72 hours. Anemia Panel: No results for input(s): "VITAMINB12", "FOLATE", "FERRITIN", "TIBC", "IRON", "RETICCTPCT" in the last 72 hours. Urine analysis:    Component Value Date/Time   COLORURINE YELLOW 10/10/2021 2033   APPEARANCEUR CLEAR 10/10/2021 2033   LABSPEC 1.015 10/10/2021 2033   PHURINE 6.0 10/10/2021 2033   GLUCOSEU NEGATIVE 10/10/2021 2033   HGBUR MODERATE (A) 10/10/2021 2033   [place] NEGATIVE 10/10/2021 2033   [place] NEGATIVE 10/10/2021 2033   PROTEINUR 100 (A) 10/10/2021 2033   NITRITE NEGATIVE 10/10/2021 2033   LEUKOCYTESUR NEGATIVE 10/10/2021 2033    Radiological Exams on Admission: I have personally reviewed images MR TOES RIGHT WO CONTRAST  Result Date: 08/16/2022 CLINICAL DATA:  Diabetic foot wound.  Right great toe wound. EXAM: MRI OF THE RIGHT TOES WITHOUT CONTRAST TECHNIQUE: Multiplanar, multisequence MR imaging of the right forefoot was performed. No intravenous contrast was administered. COMPARISON:  Radiographs 07/11/2022 FINDINGS: Bones/Joint/Cartilage Skin ulceration is noted along the dorsal and medial aspect of the great toe at the level of the interphalangeal joint. There is cortical destruction of the distal 1st phalanx associated with diffusely increased T2 and decreased T1 signal, consistent with osteomyelitis. There are similar marrow changes within the medial head of the 1st  proximal phalanx. No significant intervening joint effusion. Moderate arthritic changes are present at the 1st metatarsophalangeal joint with joint space narrowing, osteophytes and erosions, possibly due to gout based on previous radiographs. The additional toes and metatarsals appear normal. The alignment appears normal at the Lisfranc joint. Ligaments The Lisfranc ligament and collateral ligaments  of the metatarsophalangeal joints are intact. Muscles and Tendons Generalized forefoot muscular atrophy and mild T2 hyperintensity attributed to the patient's diabetes. No tendon rupture or significant tenosynovitis. Soft tissues Soft tissue ulceration of the great toe dorsally as described above. No focal fluid collection. Mild nonspecific diffuse dorsal subcutaneous edema. IMPRESSION: 1. Soft tissue ulceration along the dorsal and medial aspect of the great toe with associated osteomyelitis of the distal 1st phalanx and medial head of the 1st proximal phalanx. 2. Moderate arthritic changes at the 1st metatarsophalangeal joint with joint space narrowing, osteophytes and erosions, possibly due to gout based on previous radiographs. 3. No evidence of soft tissue abscess. Electronically Signed   By: Richardean Sale M.D.   On: 08/16/2022 15:13    EKG: I have personally reviewed EKG: no EKG on chart for this visit  Assessment/Plan Active Problems:   HTN (hypertension)   Type 2 diabetes mellitus with peripheral neuropathy (HCC)   Anemia in chronic kidney disease   CKD (chronic kidney disease) stage 5, GFR less than 15 ml/min (HCC)   Osteomyelitis of great toe of right foot (HCC)    Assessment and Plan: Osteomyelitis of great toe of right foot (HCC) Non-healing toe ulcer with osteomyelitis 1st proximal phalanx by MRI. Patient wants to avoid surgery if possible  Plan IV Vancomycin and Cefipeme  ID consult re: duration of treatment with recommendation for home care.  CKD (chronic kidney disease) stage 5, GFR  less than 15 ml/min (HCC) Creatinine at baseline. He is followed by nephrology and is being prepared for dialysis  Plan Continue present regimen  Anemia in chronic kidney disease Stable Hgb by chart review. No intervention needed  Type 2 diabetes mellitus with peripheral neuropathy (HCC) Last A1C 7.7% 07/11/22.  Plan Trulicity on Sundays  Coverage with sliding scale.  HTN (hypertension) BP mildly elevated. Per PCP notes generally controlled.  Plan Continue home meds        DVT prophylaxis: SQ Heparin Code Status: Full Code Family Communication: wife present during evaluation. Understands Dx and Tx plan  Disposition Plan: home 24-48 hrs  Consults called: ID to be consulted  Admission status: Observation, Med-Surg   Adella Hare, MD [facility] 08/17/2022, 11:04 AM

## 2022-08-18 ENCOUNTER — Encounter (HOSPITAL_COMMUNITY): Payer: No Typology Code available for payment source

## 2022-08-18 DIAGNOSIS — Z87891 Personal history of nicotine dependence: Secondary | ICD-10-CM | POA: Diagnosis not present

## 2022-08-18 DIAGNOSIS — Z888 Allergy status to other drugs, medicaments and biological substances status: Secondary | ICD-10-CM | POA: Diagnosis not present

## 2022-08-18 DIAGNOSIS — N179 Acute kidney failure, unspecified: Secondary | ICD-10-CM | POA: Diagnosis not present

## 2022-08-18 DIAGNOSIS — E1169 Type 2 diabetes mellitus with other specified complication: Secondary | ICD-10-CM | POA: Diagnosis not present

## 2022-08-18 DIAGNOSIS — I120 Hypertensive chronic kidney disease with stage 5 chronic kidney disease or end stage renal disease: Secondary | ICD-10-CM | POA: Diagnosis not present

## 2022-08-18 DIAGNOSIS — N185 Chronic kidney disease, stage 5: Secondary | ICD-10-CM | POA: Diagnosis not present

## 2022-08-18 DIAGNOSIS — N189 Chronic kidney disease, unspecified: Secondary | ICD-10-CM

## 2022-08-18 DIAGNOSIS — Z79899 Other long term (current) drug therapy: Secondary | ICD-10-CM | POA: Diagnosis not present

## 2022-08-18 DIAGNOSIS — I159 Secondary hypertension, unspecified: Secondary | ICD-10-CM | POA: Diagnosis not present

## 2022-08-18 DIAGNOSIS — M868X7 Other osteomyelitis, ankle and foot: Secondary | ICD-10-CM | POA: Diagnosis not present

## 2022-08-18 DIAGNOSIS — M869 Osteomyelitis, unspecified: Secondary | ICD-10-CM | POA: Diagnosis not present

## 2022-08-18 DIAGNOSIS — Z7985 Long-term (current) use of injectable non-insulin antidiabetic drugs: Secondary | ICD-10-CM | POA: Diagnosis not present

## 2022-08-18 DIAGNOSIS — Z8673 Personal history of transient ischemic attack (TIA), and cerebral infarction without residual deficits: Secondary | ICD-10-CM | POA: Diagnosis not present

## 2022-08-18 DIAGNOSIS — L97514 Non-pressure chronic ulcer of other 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: Secondary | ICD-10-CM

## 2022-08-18 DIAGNOSIS — L97519 Non-pressure chronic ulcer of other part of right foot with unspecified severity: Secondary | ICD-10-CM | POA: Diagnosis not present

## 2022-08-18 DIAGNOSIS — Z7982 Long term (current) use of aspirin: Secondary | ICD-10-CM | POA: Diagnosis not present

## 2022-08-18 DIAGNOSIS — E785 Hyperlipidemia, unspecified: Secondary | ICD-10-CM | POA: Diagnosis not present

## 2022-08-18 DIAGNOSIS — D631 Anemia in chronic kidney disease: Secondary | ICD-10-CM | POA: Diagnosis not present

## 2022-08-18 DIAGNOSIS — E1142 Type 2 diabetes mellitus with diabetic polyneuropathy: Secondary | ICD-10-CM | POA: Diagnosis not present

## 2022-08-18 DIAGNOSIS — Z794 Long term (current) use of insulin: Secondary | ICD-10-CM | POA: Diagnosis not present

## 2022-08-18 DIAGNOSIS — Z89412 Acquired absence of left great toe: Secondary | ICD-10-CM | POA: Diagnosis not present

## 2022-08-18 DIAGNOSIS — E11621 Type 2 diabetes mellitus with foot ulcer: Secondary | ICD-10-CM | POA: Diagnosis not present

## 2022-08-18 DIAGNOSIS — N2581 Secondary hyperparathyroidism of renal origin: Secondary | ICD-10-CM | POA: Diagnosis not present

## 2022-08-18 DIAGNOSIS — E1122 Type 2 diabetes mellitus with diabetic chronic kidney disease: Secondary | ICD-10-CM | POA: Diagnosis not present

## 2022-08-18 LAB — GLUCOSE, CAPILLARY
Glucose-Capillary: 142 mg/dL — ABNORMAL HIGH (ref 70–99)
Glucose-Capillary: 119 mg/dL — ABNORMAL HIGH (ref 70–99)
Glucose-Capillary: 184 mg/dL — ABNORMAL HIGH (ref 70–99)
Glucose-Capillary: 147 mg/dL — ABNORMAL HIGH (ref 70–99)

## 2022-08-18 LAB — BASIC METABOLIC PANEL
Anion gap: 8 (ref 5–15)
BUN: 42 mg/dL — ABNORMAL HIGH (ref 8–23)
CO2: 22 mmol/L (ref 22–32)
Calcium: 8.6 mg/dL — ABNORMAL LOW (ref 8.9–10.3)
Chloride: 109 mmol/L (ref 98–111)
Creatinine, Ser: 4.18 mg/dL — ABNORMAL HIGH (ref 0.61–1.24)
GFR, Estimated: 15 mL/min — ABNORMAL LOW (ref >60)
Glucose, Bld: 108 mg/dL — ABNORMAL HIGH (ref 70–99)
Potassium: 4.5 mmol/L (ref 3.5–5.1)
Sodium: 139 mmol/L (ref 135–145)

## 2022-08-18 MED ORDER — LACTATED RINGERS IV SOLN: INTRAVENOUS | Status: AC

## 2022-08-18 NOTE — Progress Notes (Signed)
Mobility Specialist Progress Note:   08/18/22 1204  Mobility  Activity Ambulated independently in hallway  Level of Assistance Independent  Assistive Device None  Distance Ambulated (ft) 400 ft  Activity Response Tolerated well  Mobility Referral Yes  $Mobility charge 1 Mobility   Pt received in bed and agreeable. No complaints. Pt left in bed with all needs met and call bell in reach.   Andrey Campanile Mobility Specialist Please contact via SecureChat or  Rehab office at [phone]

## 2022-08-18 NOTE — Progress Notes (Signed)
[facility]                                                                              [name], is a 71 y.o. male, DOB - 1951/02/21, KCL:[id number] Admit date - 08/16/2022    Outpatient Primary MD for the patient is Coral Spikes, DO  LOS - 0  days  Chief Complaint  Patient presents with   Foot Pain       Brief summary   [name], a 71 y/o with DM on insulin, HLD, HTN, CKD 5, prior h/o toe/foot infections requiring amputation has had a non-healing right great toe ulcer for 3 weeks followed by [facility] and his PCP Dr. Lacinda Axon. Due to non-healing despite oral abx he had outpatient MRI right foot revealing osteomyelitis medial head and 1st proximal phalanx right great toe. Podiatry referred him [facility] for admission for IV abx.   ED Course: afebrile. 152/89  HR 81. Lab: Glucose 11, BUN/Cr  44/476  Lactic acid 1.4. WBC 9.1 with nl Diff. TRH called to admit for IV abx. VAnc and Cefepime started in ED   Assessment & Plan    Principal Problem:   Osteomyelitis of great toe of right foot ([place]) - presented with nonhealing toe ulcer with osteomyelitis 1st proximal phalanx by MRI  - patient was started on vanc and cefepime however ID recommended to hold off antibiotics until bone biopsy and culture. - Patient refusing to have toe amputation  - Plan for Bone bx and culture in am, NPO after MN - will start on doxycyxline and augmentin after the bone bx and culture and outpatient follow-up with ID      Active Problems:   HTN (hypertension) - BP stable, cont amlodipine, demadex      Type 2 diabetes mellitus with peripheral neuropathy (HCC) - HbA1C 7.7%  - cont sliding scale insulin while inpatient Recent Labs    08/17/22 1211 08/17/22 1716 08/17/22 2019 08/18/22 0822  GLUCAP 181* 129* 171* 142*       Anemia in chronic kidney disease - H/H stable   AKI  on CKD (chronic kidney disease) stage 5, GFR less than 15 ml/min (HCC) - baseline cr 3.8-4.3,  last cr 3.95 pn 05/12/22 - presented with Cr 4.76, hold torsemide, placed on gentle hydration    Code Status: full  DVT Prophylaxis:  heparin injection 5,000 Units Start: 08/17/22 1400   Level of Care: Level of care: Med-Surg Family Communication: Updated patient Disposition Plan:      Remains inpatient appropriate:    Procedures:   Consultants:   ID, Podiatry   Antimicrobials:   Anti-infectives (From admission, onward)    Start     Dose/Rate Route Frequency Ordered Stop   08/18/22 0000  vancomycin variable dose per unstable renal function (pharmacist dosing)  Status:  Discontinued         Does not apply See admin instructions 08/17/22 0846 08/17/22 1616   08/17/22 0900  vancomycin (VANCOREADY) IVPB 1750 mg/350 mL        1,750 mg 175 mL/hr over 120 Minutes Intravenous  Once 08/17/22 0846 08/17/22 1516  08/17/22 0900  ceFEPIme (MAXIPIME) 2 g in sodium chloride 0.9 % 100 mL IVPB  Status:  Discontinued        2 g 200 mL/hr over 30 Minutes Intravenous Every 24 hours 08/17/22 0846 08/17/22 1616          Medications  amLODipine  10 mg Oral Daily   aspirin EC  81 mg Oral Daily   atorvastatin  80 mg Oral Daily   calcitRIOL  0.25 mcg Oral Daily   docusate sodium  100 mg Oral BID   heparin  5,000 Units Subcutaneous Q8H   insulin aspart  0-15 Units Subcutaneous TID WC   sodium chloride flush  3 mL Intravenous Q12H   torsemide  20 mg Oral Daily      Subjective:   [name] was seen and examined today.  No acute complaints. Agreeable for bone biopsy.   Patient denies dizziness, chest pain, shortness of breath, abdominal pain, N/V/D/C.  Objective:   Vitals:   08/17/22 1113 08/17/22 1558 08/17/22 1938 08/18/22 0824  BP: (!) 157/78 (!) 151/74 139/77 (!) 140/69  Pulse: 83 88 84 85  Resp: '16 17 17 17  '$ Temp: (!) 97.5 F (36.4 C) 97.7 F (36.5 C) (!) 97.5 F (36.4 C) 97.9 F (36.6 C)  TempSrc: Oral Oral Oral Oral  SpO2: 100% 100% 100% 100%  Weight:  89.9 kg     Height:  6' (1.829 m)      Intake/Output Summary (Last 24 hours) at 08/18/2022 1159 Last data filed at 08/17/2022 1608 Gross per 24 hour  Intake 352.64 ml  Output --  Net 352.64 ml     Wt Readings from Last 3 Encounters:  08/17/22 89.9 kg  08/16/22 88.2 kg  08/11/22 88 kg     Exam General: Alert and oriented x 3, NAD Cardiovascular: S1 S2 auscultated,  RRR Respiratory: Clear to auscultation bilaterally, no wheezing Gastrointestinal: Soft, nontender, nondistended, + bowel sounds Ext: no pedal edema bilaterally Neuro: Strength 5/5 upper and lower extremities bilaterally Skin: right great toe ulcer  Psych: Normal affect and demeanor, alert and oriented x3     Data Reviewed:  I have personally reviewed following labs    CBC Lab Results  Component Value Date   WBC 9.1 08/16/2022   RBC 4.10 (L) 08/16/2022   HGB 12.0 (L) 08/16/2022   HCT 36.5 (L) 08/16/2022   MCV 89.0 08/16/2022   MCH 29.3 08/16/2022   PLT 235 08/16/2022   MCHC 32.9 08/16/2022   RDW 13.9 08/16/2022   LYMPHSABS 2.3 08/16/2022   MONOABS 0.9 08/16/2022   EOSABS 0.3 08/16/2022   BASOSABS 0.0 40/05/6760     Last metabolic panel Lab Results  Component Value Date   NA 144 08/16/2022   K 5.1 08/16/2022   CL 112 (H) 08/16/2022   CO2 23 08/16/2022   BUN 44 (H) 08/16/2022   CREATININE 4.76 (H) 08/16/2022   GLUCOSE 111 (H) 08/16/2022   GFRNONAA 13 (L) 08/16/2022   CALCIUM 9.0 08/16/2022   PHOS 6.7 (H) 10/28/2021   PROT 7.2 08/16/2022   ALBUMIN 3.3 (L) 08/16/2022   BILITOT 0.5 08/16/2022   ALKPHOS 68 08/16/2022   AST 33 08/16/2022   ALT 37 08/16/2022   ANIONGAP 9 08/16/2022    CBG (last 3)  Recent Labs    08/17/22 1716 08/17/22 2019 08/18/22 0822  GLUCAP 129* 171* 142*      Coagulation Profile: No results for input(s): "INR", "PROTIME" in the last 168 hours.  Radiology Studies: I have personally reviewed the imaging studies  MR TOES RIGHT WO CONTRAST  Result Date:  08/16/2022 CLINICAL DATA:  Diabetic foot wound.  Right great toe wound. EXAM: MRI OF THE RIGHT TOES WITHOUT CONTRAST TECHNIQUE: Multiplanar, multisequence MR imaging of the right forefoot was performed. No intravenous contrast was administered. COMPARISON:  Radiographs 07/11/2022 FINDINGS: Bones/Joint/Cartilage Skin ulceration is noted along the dorsal and medial aspect of the great toe at the level of the interphalangeal joint. There is cortical destruction of the distal 1st phalanx associated with diffusely increased T2 and decreased T1 signal, consistent with osteomyelitis. There are similar marrow changes within the medial head of the 1st proximal phalanx. No significant intervening joint effusion. Moderate arthritic changes are present at the 1st metatarsophalangeal joint with joint space narrowing, osteophytes and erosions, possibly due to gout based on previous radiographs. The additional toes and metatarsals appear normal. The alignment appears normal at the Lisfranc joint. Ligaments The Lisfranc ligament and collateral ligaments of the metatarsophalangeal joints are intact. Muscles and Tendons Generalized forefoot muscular atrophy and mild T2 hyperintensity attributed to the patient's diabetes. No tendon rupture or significant tenosynovitis. Soft tissues Soft tissue ulceration of the great toe dorsally as described above. No focal fluid collection. Mild nonspecific diffuse dorsal subcutaneous edema. IMPRESSION: 1. Soft tissue ulceration along the dorsal and medial aspect of the great toe with associated osteomyelitis of the distal 1st phalanx and medial head of the 1st proximal phalanx. 2. Moderate arthritic changes at the 1st metatarsophalangeal joint with joint space narrowing, osteophytes and erosions, possibly due to gout based on previous radiographs. 3. No evidence of soft tissue abscess. Electronically Signed   By: Richardean Sale M.D.   On: 08/16/2022 15:13       [name] M.D. [facility] 08/18/2022, 11:59 AM  Available via Epic secure chat 7am-7pm After 7 pm, please refer to night coverage provider listed on amion.

## 2022-08-18 NOTE — Care Management Obs Status (Cosign Needed)
[place] NOTIFICATION   Patient Details  Name: [name] MRN: [id number] Date of Birth: 05-07-1951   Medicare Observation Status Notification Given:  Yes    Curlene Labrum, RN 08/18/2022, 1:20 PM

## 2022-08-18 NOTE — Consult Note (Addendum)
[facility] for Infectious Disease    Date of Admission:  08/16/2022   Total days of inpatient antibiotics 1        Reason for Consult: Osteomyelitis    Principal Problem:   Osteomyelitis of great toe of right foot ([place]) Active Problems:   HTN (hypertension)   Type 2 diabetes mellitus with peripheral neuropathy (HCC)   Anemia in chronic kidney disease   CKD (chronic kidney disease) stage 5, GFR less than 15 ml/min [facility])   Assessment: 71 year old male admitted with:  #Right first toe osteomyelitis #Concern for nonhealing ulcer #Diabetes mellitus(A1c 7.7) #Hx of left foot osteomyelitis treated with prolonged course of doxy and augmetin(I&D cx polymicrobial) -Patient has been on about a couple weeks of Augmentin.  Followed by podiatry.  Admitted due to MRI findings of osteomyelitis and c/f need for IV abx.  Vital stable without leukocytosis on admission. -MRI right showed soft tissue ulceration along the dorsal medial aspect great toe associated osteomyelitis distal first phalanx, medial head of the first proximal phalanx. -I did not appreciate any skin soft tissue infection, also does not appear to be open..  I think the best course of action would be to get a bone biopsy off of antibiotics in a week for targeted antibiotic therapy.  He has been seen by podiatry and does not want amputation. -10/10/21 ABIs normal Recommendations:  -D/C antibiotics as patient has no signs of skin soft tissue infection, vitals are stable, no leukocytosis -Seen by Podiatry and plan follow-up one week. Please obtain bone Bx with cultures and pathology at that point off of antibiotics. Can start Doxy and Augmentin while waiting for Cx results -F/U with ID on 11/30 with myself to follow up bone Bx  ID will sign off, please engage with any question or concerns.    I have personally spent 90  minutes involved in face-to-face and non-face-to-face activities for this patient on the day of the  visit. Professional time spent includes the following activities: Preparing to see the patient (review of tests), Obtaining and/or reviewing separately obtained history (admission/discharge record), Performing a medically appropriate examination and/or evaluation , Ordering medications/tests/procedures, referring and communicating with other health care professionals, Documenting clinical information in the EMR, Independently interpreting results (not separately reported), Communicating results to the patient/family/caregiver, Counseling and educating the patient/family/caregiver and Care coordination (not separately reported).   Microbiology:   Antibiotics: Cefepime-  HPI: [name] is a 71 y.o. male with CKD stage IV, diabetes mellitus, history of left foot osteo status post I&D with cultures growing strep anginosis, proteus mirabilis and Prevotella bivia , complicated osteomyelitis of the heel and abscess status post I&D on 1/15 with pathology positive for osteomyelitis he was on prolonged course of antibiotics with Augmentin and Doxy with resolution of findings followed by podiatry for foot wounds.  He has been on Augmentin for right foot ulcer that started about 3 weeks ago.  MRI done showed osteomyelitis admitted for further management.  Patient was stable on arrival without fever or leukocytosis.   Review of Systems: Review of Systems  All other systems reviewed and are negative.   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 due to underlying condition, limited to breakdown of skin ([place])    left   GERD (gastroesophageal reflux disease)  Hyperlipidemia    Hypertension    Non compliance w medication regimen    Secondary hyperparathyroidism of renal origin [facility])    per nephrology lov dr Candiss Norse 05-22-2022   Stroke [facility]) 2016   no deficits     Social History   Tobacco Use   Smoking status: Former   Smokeless tobacco: Never  Vaping Use   Vaping Use: Never used  Substance Use Topics   Alcohol use: Not Currently   Drug use: Never    History reviewed. No pertinent family history. Scheduled Meds:  amLODipine  10 mg Oral Daily   aspirin EC  81 mg Oral Daily   atorvastatin  80 mg Oral Daily   calcitRIOL  0.25 mcg Oral Daily   docusate sodium  100 mg Oral BID   heparin  5,000 Units Subcutaneous Q8H   insulin aspart  0-15 Units Subcutaneous TID WC   sodium chloride flush  3 mL Intravenous Q12H   torsemide  20 mg Oral Daily   Continuous Infusions:  sodium chloride     PRN Meds:.sodium chloride, mouth rinse, sodium chloride flush, traMADol Allergies  Allergen Reactions   Losartan Potassium Other (See Comments)    hyperkalemia   Sildenafil Other (See Comments)    Not effective. "Did not like the way it made me feel"    OBJECTIVE: Blood pressure 139/77, pulse 84, temperature (!) 97.5 F (36.4 C), temperature source Oral, resp. rate 17, height 6' (1.829 m), weight 89.9 kg, SpO2 100 %.  Physical Exam Vitals reviewed.  Constitutional:      General: He is not in acute distress.    Appearance: He is normal weight. He is not toxic-appearing.  HENT:     Head: Normocephalic and atraumatic.     Right Ear: External ear normal.     Left Ear: External ear normal.     Nose: No congestion or rhinorrhea.     Mouth/Throat:     Mouth: Mucous membranes are moist.     Pharynx: Oropharynx is clear.  Eyes:     Extraocular Movements: Extraocular movements intact.     Conjunctiva/sclera: Conjunctivae normal.     Pupils: Pupils are equal, round, and reactive to light.  Cardiovascular:     Rate and Rhythm: Normal rate and regular rhythm.     Heart sounds: No murmur heard.    No friction rub. No gallop.  Pulmonary:     Effort: Pulmonary effort is normal.     Breath sounds: Normal breath sounds.  Abdominal:     General:  Abdomen is flat. Bowel sounds are normal.     Palpations: Abdomen is soft.  Musculoskeletal:        General: No swelling. Normal range of motion.     Cervical back: Normal range of motion and neck supple.  Skin:    General: Skin is warm and dry.  Neurological:     General: No focal deficit present.     Mental Status: He is oriented to person, place, and time.  Psychiatric:        Mood and Affect: Mood normal.     Lab Results Lab Results  Component Value Date   WBC 9.1 08/16/2022   HGB 12.0 (L) 08/16/2022   HCT 36.5 (L) 08/16/2022   MCV 89.0 08/16/2022   PLT 235 08/16/2022    Lab Results  Component Value Date   CREATININE 4.76 (H) 08/16/2022   BUN 44 (H) 08/16/2022   NA 144 08/16/2022   K  5.1 08/16/2022   CL 112 (H) 08/16/2022   CO2 23 08/16/2022    Lab Results  Component Value Date   ALT 37 08/16/2022   AST 33 08/16/2022   ALKPHOS 68 08/16/2022   BILITOT 0.5 08/16/2022       Laurice Record, [facility] 08/18/2022, 5:49 AM

## 2022-08-18 NOTE — TOC Initial Note (Signed)
Transition of Care [facility]) - Initial/Assessment Note    Patient Details  Name: [name] MRN: [id number] Date of Birth: [birth year]  [facility]) CM/SW Contact:    Curlene Labrum, RN Phone Number: 08/18/2022, 1:34 PM  Clinical Narrative:                 CM met with the patient at the bedside.  Medicare observation letter provided to the patient.  The patient admitted to hospital with right root pain and has declined amputation.  The patient is awaiting bone biopsy in the am and will be npo after MN tonight.  The patient does not smoke nor drink.  DME - patient misplaced his cane at home but has no mobility problems at this time.  Transportation - the patient states that both him and his wife drive.  PCP - Dr. Lacinda Axon.  No TOC needs determined at this time.  Expected Discharge Plan: Home/Self Care Barriers to Discharge: Continued Medical Work up   Patient Goals and CMS Choice Patient states their goals for this hospitalization and ongoing recovery are:: To return home CMS Medicare.gov Compare Post Acute Care list provided to:: Patient Choice offered to / list presented to : Patient  Expected Discharge Plan and Services Expected Discharge Plan: Home/Self Care   Discharge Planning Services: CM Consult   Living arrangements for the past 2 months: Single Family Home                                      Prior Living Arrangements/Services Living arrangements for the past 2 months: Single Family Home Lives with:: Spouse Patient language and need for interpreter reviewed:: Yes Do you feel safe going back to the place where you live?: Yes      Need for Family Participation in Patient Care: Yes (Comment) Care giver support system in place?: Yes (comment) Current home services: DME (Misplaced Cane at home) Criminal Activity/Legal Involvement Pertinent to Current Situation/Hospitalization: No - Comment as needed  Activities of Daily Living Home  Assistive Devices/Equipment: Cane (specify quad or straight), CBG Meter, Dentures (specify type) ADL Screening (condition at time of admission) Patient's cognitive ability adequate to safely complete daily activities?: Yes Is the patient deaf or have difficulty hearing?: No Does the patient have difficulty seeing, even when wearing glasses/contacts?: No Does the patient have difficulty concentrating, remembering, or making decisions?: No Patient able to express need for assistance with ADLs?: Yes Does the patient have difficulty dressing or bathing?: No Independently performs ADLs?: Yes (appropriate for developmental age) Does the patient have difficulty walking or climbing stairs?: No Weakness of Legs: None Weakness of Arms/Hands: None  Permission Sought/Granted Permission sought to share information with : Case Manager                Emotional Assessment Appearance:: Appears stated age Attitude/Demeanor/Rapport: Gracious Affect (typically observed): Accepting Orientation: : Oriented to Self, Oriented to Place, Oriented to  Time, Oriented to Situation Alcohol / Substance Use: Never Used Psych Involvement: No (comment)  Admission diagnosis:  Osteomyelitis of toe of right foot (HCC) [M86.9] Chronic kidney disease, unspecified CKD stage [N18.9] Osteomyelitis of great toe of right foot (HCC) [M86.9] Patient Active Problem List   Diagnosis Date Noted   CKD (chronic kidney disease) stage 5, GFR less than 15 ml/min ([place]) 07/12/2022   Former smoker 07/12/2022   History of stroke 07/12/2022   Osteomyelitis of  great toe of right foot ([place]) 07/12/2022   Diabetic neuropathy ([place]) 10/28/2021   Anemia in chronic kidney disease 05/27/2020   History of amputation of lesser toe of left foot ([place]) 08/18/2015   Hyperlipidemia, mixed 08/18/2015   Type 2 diabetes mellitus with peripheral neuropathy ([place]) 08/18/2015   HTN (hypertension) 04/17/2013   PCP:  Coral Spikes, DO Pharmacy:    [place], [place] - [address] [place] [address] Phone: [phone] Fax: [phone]     Social Determinants of Health (SDOH) Interventions    Readmission Risk Interventions     No data to display

## 2022-08-18 NOTE — Progress Notes (Signed)
  Subjective:  Patient ID: [name], male    DOB: 07/08/1951,  MRN: [id number]  Patient seen at bedside, resting comfortably, says toe is not terribly painful   Negative for chest pain and shortness of breath Fever: no Night sweats: no Constitutional signs: no Objective:   Vitals:   08/17/22 1938 08/18/22 0824  BP: 139/77 (!) 140/69  Pulse: 84 85  Resp: 17 17  Temp: (!) 97.5 F (36.4 C) 97.9 F (36.6 C)  SpO2: 100% 100%   General AA&O x3. Normal mood and affect.  Vascular Dorsalis pedis and posterior tibial pulses 2/4 bilat. Brisk capillary refill to all digits. Pedal hair present.  Neurologic Epicritic sensation grossly intact.  Dermatologic Full thickness ulcer medial hallux and distal tip with necrotic eschar  Orthopedic: MMT 5/5 in dorsiflexion, plantarflexion, inversion, and eversion. Normal joint ROM without pain or crepitus.   IMPRESSION: 1. Soft tissue ulceration along the dorsal and medial aspect of the great toe with associated osteomyelitis of the distal 1st phalanx and medial head of the 1st proximal phalanx. 2. Moderate arthritic changes at the 1st metatarsophalangeal joint with joint space narrowing, osteophytes and erosions, possibly due to gout based on previous radiographs. 3. No evidence of soft tissue abscess.     Electronically Signed   By: Richardean Sale M.D.   On: 08/16/2022 15:13 Assessment & Plan:  Patient was evaluated and treated and all questions answered.  Osteomyelitis right great toe -we reviewed his MRI results and the success of antibiotic treatment in toe osteomyelitis, I discussed with him it is more likely than not to be unsuccessful. Discussed need for biopsy and tissue culture to narrow abx choice. He would like to pursue this prior to considering amputation. -All questions addressed. Added to OR for 11/18 -NPO past midnight -Consent order placed   Criselda Peaches, DPM  Accessible via secure chat for questions or  concerns.

## 2022-08-19 ENCOUNTER — Inpatient Hospital Stay (HOSPITAL_COMMUNITY): Payer: No Typology Code available for payment source | Admitting: Certified Registered Nurse Anesthetist

## 2022-08-19 ENCOUNTER — Other Ambulatory Visit: Payer: Self-pay

## 2022-08-19 ENCOUNTER — Encounter (HOSPITAL_COMMUNITY)
Admission: EM | Disposition: A | Discharge status: Home / Self Care | Payer: Self-pay | Source: Home / Self Care | Attending: Internal Medicine

## 2022-08-19 ENCOUNTER — Encounter (HOSPITAL_COMMUNITY): Payer: Self-pay | Admitting: Internal Medicine

## 2022-08-19 DIAGNOSIS — E1169 Type 2 diabetes mellitus with other specified complication: Secondary | ICD-10-CM | POA: Diagnosis not present

## 2022-08-19 DIAGNOSIS — L97514 Non-pressure chronic ulcer of other 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: Secondary | ICD-10-CM | POA: Diagnosis not present

## 2022-08-19 DIAGNOSIS — N185 Chronic kidney disease, stage 5: Secondary | ICD-10-CM | POA: Diagnosis not present

## 2022-08-19 DIAGNOSIS — E1142 Type 2 diabetes mellitus with diabetic polyneuropathy: Secondary | ICD-10-CM | POA: Diagnosis not present

## 2022-08-19 DIAGNOSIS — N189 Chronic kidney disease, unspecified: Secondary | ICD-10-CM | POA: Diagnosis not present

## 2022-08-19 DIAGNOSIS — M869 Osteomyelitis, unspecified: Secondary | ICD-10-CM

## 2022-08-19 DIAGNOSIS — L97519 Non-pressure chronic ulcer of other part of right foot with unspecified severity: Secondary | ICD-10-CM

## 2022-08-19 DIAGNOSIS — E11621 Type 2 diabetes mellitus with foot ulcer: Secondary | ICD-10-CM | POA: Diagnosis not present

## 2022-08-19 DIAGNOSIS — Z8673 Personal history of transient ischemic attack (TIA), and cerebral infarction without residual deficits: Secondary | ICD-10-CM | POA: Diagnosis not present

## 2022-08-19 HISTORY — PX: I & D EXTREMITY: SHX5045

## 2022-08-19 LAB — CBC
HCT: 33.6 % — ABNORMAL LOW (ref 39.0–52.0)
Hemoglobin: 11 g/dL — ABNORMAL LOW (ref 13.0–17.0)
MCH: 28.5 pg (ref 26.0–34.0)
MCHC: 32.7 g/dL (ref 30.0–36.0)
MCV: 87 fL (ref 80.0–100.0)
Platelets: 186 10*3/uL (ref 150–400)
RBC: 3.86 MIL/uL — ABNORMAL LOW (ref 4.22–5.81)
RDW: 13.5 % (ref 11.5–15.5)
WBC: 7.9 10*3/uL (ref 4.0–10.5)
nRBC: 0 % (ref 0.0–0.2)

## 2022-08-19 LAB — BASIC METABOLIC PANEL
Anion gap: 12 (ref 5–15)
BUN: 47 mg/dL — ABNORMAL HIGH (ref 8–23)
CO2: 21 mmol/L — ABNORMAL LOW (ref 22–32)
Calcium: 8.7 mg/dL — ABNORMAL LOW (ref 8.9–10.3)
Chloride: 104 mmol/L (ref 98–111)
Creatinine, Ser: 4.14 mg/dL — ABNORMAL HIGH (ref 0.61–1.24)
GFR, Estimated: 15 mL/min — ABNORMAL LOW (ref >60)
Glucose, Bld: 121 mg/dL — ABNORMAL HIGH (ref 70–99)
Potassium: 4.1 mmol/L (ref 3.5–5.1)
Sodium: 137 mmol/L (ref 135–145)

## 2022-08-19 LAB — GLUCOSE, CAPILLARY
Glucose-Capillary: 107 mg/dL — ABNORMAL HIGH (ref 70–99)
Glucose-Capillary: 104 mg/dL — ABNORMAL HIGH (ref 70–99)
Glucose-Capillary: 94 mg/dL (ref 70–99)
Glucose-Capillary: 144 mg/dL — ABNORMAL HIGH (ref 70–99)
Glucose-Capillary: 140 mg/dL — ABNORMAL HIGH (ref 70–99)

## 2022-08-19 SURGERY — IRRIGATION AND DEBRIDEMENT EXTREMITY
Anesthesia: Monitor Anesthesia Care | Site: Toe | Laterality: Right

## 2022-08-19 MED ORDER — VANCOMYCIN HCL 1000 MG IV SOLR
INTRAVENOUS | Status: AC
  Filled 2022-08-19: qty 20

## 2022-08-19 MED ORDER — CHLORHEXIDINE GLUCONATE 0.12 % MT SOLN
OROMUCOSAL | Status: AC
  Filled 2022-08-19: qty 15

## 2022-08-19 MED ORDER — INSULIN ASPART 100 UNIT/ML IJ SOLN: 0.0000 [IU] | INTRAMUSCULAR | Status: DC | PRN

## 2022-08-19 MED ORDER — BUPIVACAINE HCL (PF) 0.5 % IJ SOLN
INTRAMUSCULAR | Status: AC
  Filled 2022-08-19: qty 30

## 2022-08-19 MED ORDER — FENTANYL CITRATE (PF) 250 MCG/5ML IJ SOLN
INTRAMUSCULAR | Status: AC
  Filled 2022-08-19: qty 5

## 2022-08-19 MED ORDER — SURGILUBE EX GEL
CUTANEOUS | Status: DC | PRN
  Administered 2022-08-19: 1 via TOPICAL

## 2022-08-19 MED ORDER — DOXYCYCLINE HYCLATE 100 MG PO TABS
100.0000 mg | ORAL_TABLET | Freq: Two times a day (BID) | ORAL | Status: DC
  Administered 2022-08-19 – 2022-08-20 (×3): 100 mg via ORAL
  Filled 2022-08-19 (×3): qty 1

## 2022-08-19 MED ORDER — ACETAMINOPHEN 10 MG/ML IV SOLN: 1000.0000 mg | Freq: Once | INTRAVENOUS | Status: DC | PRN

## 2022-08-19 MED ORDER — FENTANYL CITRATE (PF) 250 MCG/5ML IJ SOLN
INTRAMUSCULAR | Status: DC | PRN
  Administered 2022-08-19: 50 ug via INTRAVENOUS

## 2022-08-19 MED ORDER — PHENYLEPHRINE 80 MCG/ML (10ML) SYRINGE FOR IV PUSH (FOR BLOOD PRESSURE SUPPORT)
PREFILLED_SYRINGE | INTRAVENOUS | Status: DC | PRN
  Administered 2022-08-19: 80 ug via INTRAVENOUS

## 2022-08-19 MED ORDER — AMOXICILLIN-POT CLAVULANATE 875-125 MG PO TABS: 1.0000 | ORAL_TABLET | Freq: Two times a day (BID) | ORAL | 0 refills | Status: AC

## 2022-08-19 MED ORDER — 0.9 % SODIUM CHLORIDE (POUR BTL) OPTIME
TOPICAL | Status: DC | PRN
  Administered 2022-08-19: 1000 mL

## 2022-08-19 MED ORDER — DOXYCYCLINE HYCLATE 100 MG PO CAPS: 100.0000 mg | ORAL_CAPSULE | Freq: Two times a day (BID) | ORAL | 0 refills | Status: AC

## 2022-08-19 MED ORDER — TRAMADOL HCL 50 MG PO TABS: 50.0000 mg | ORAL_TABLET | Freq: Three times a day (TID) | ORAL | 0 refills | Status: DC | PRN

## 2022-08-19 MED ORDER — MIDAZOLAM HCL 2 MG/2ML IJ SOLN
INTRAMUSCULAR | Status: DC | PRN
  Administered 2022-08-19: 2 mg via INTRAVENOUS

## 2022-08-19 MED ORDER — SODIUM CHLORIDE 0.9 % IV SOLN
2.0000 g | Freq: Once | INTRAVENOUS | Status: DC
  Administered 2022-08-19: 2 g via INTRAVENOUS
  Filled 2022-08-19: qty 20

## 2022-08-19 MED ORDER — SODIUM CHLORIDE 0.9 % IV SOLN: INTRAVENOUS | Status: DC

## 2022-08-19 MED ORDER — ORAL CARE MOUTH RINSE
15.0000 mL | Freq: Once | OROMUCOSAL | Status: AC
  Administered 2022-08-19: 15 mL via OROMUCOSAL

## 2022-08-19 MED ORDER — AMOXICILLIN-POT CLAVULANATE 875-125 MG PO TABS
1.0000 | ORAL_TABLET | Freq: Two times a day (BID) | ORAL | Status: DC
  Administered 2022-08-19 – 2022-08-20 (×3): 1 via ORAL
  Filled 2022-08-19 (×3): qty 1

## 2022-08-19 MED ORDER — BUPIVACAINE HCL (PF) 0.5 % IJ SOLN
INTRAMUSCULAR | Status: DC | PRN
  Administered 2022-08-19: 10 mL

## 2022-08-19 MED ORDER — DOXYCYCLINE HYCLATE 100 MG PO TABS: 100.0000 mg | ORAL_TABLET | Freq: Two times a day (BID) | ORAL | Status: DC

## 2022-08-19 MED ORDER — PROPOFOL 500 MG/50ML IV EMUL
INTRAVENOUS | Status: DC | PRN
  Administered 2022-08-19: 50 ug/kg/min via INTRAVENOUS

## 2022-08-19 MED ORDER — FENTANYL CITRATE (PF) 100 MCG/2ML IJ SOLN: 25.0000 ug | INTRAMUSCULAR | Status: DC | PRN

## 2022-08-19 MED ORDER — CHLORHEXIDINE GLUCONATE 0.12 % MT SOLN: 15.0000 mL | Freq: Once | OROMUCOSAL | Status: AC

## 2022-08-19 MED ORDER — MIDAZOLAM HCL 2 MG/2ML IJ SOLN
INTRAMUSCULAR | Status: AC
  Filled 2022-08-19: qty 2

## 2022-08-19 SURGICAL SUPPLY — 27 items
BAG COUNTER SPONGE SURGICOUNT (BAG) ×1 IMPLANT
BNDG ELASTIC 4X5.8 VLCR STR LF (GAUZE/BANDAGES/DRESSINGS) IMPLANT
BNDG GAUZE DERMACEA FLUFF 4 (GAUZE/BANDAGES/DRESSINGS) IMPLANT
COVER SURGICAL LIGHT HANDLE (MISCELLANEOUS) ×1 IMPLANT
DRSG EMULSION OIL 3X3 NADH (GAUZE/BANDAGES/DRESSINGS) IMPLANT
ELECT CAUTERY BLADE 6.4 (BLADE) ×1 IMPLANT
ELECT REM PT RETURN 9FT ADLT (ELECTROSURGICAL) ×1
ELECTRODE REM PT RTRN 9FT ADLT (ELECTROSURGICAL) ×1 IMPLANT
GAUZE SPONGE 4X4 12PLY STRL (GAUZE/BANDAGES/DRESSINGS) IMPLANT
GLOVE BIO SURGEON STRL SZ7 (GLOVE) ×1 IMPLANT
GLOVE BIOGEL PI IND STRL 7.5 (GLOVE) ×1 IMPLANT
GOWN STRL REUS W/ TWL LRG LVL3 (GOWN DISPOSABLE) ×2 IMPLANT
GOWN STRL REUS W/TWL LRG LVL3 (GOWN DISPOSABLE) ×2
GRAFT MYRIAD 3 LAYER 5X5 (Graft) IMPLANT
KIT BASIN OR (CUSTOM PROCEDURE TRAY) ×1 IMPLANT
KIT TURNOVER KIT B (KITS) ×1 IMPLANT
MANIFOLD NEPTUNE II (INSTRUMENTS) ×1 IMPLANT
NDL HYPO 25GX1X1/2 BEV (NEEDLE) IMPLANT
NEEDLE HYPO 25GX1X1/2 BEV (NEEDLE) ×1 IMPLANT
NS IRRIG 1000ML POUR BTL (IV SOLUTION) ×1 IMPLANT
PACK ORTHO EXTREMITY (CUSTOM PROCEDURE TRAY) ×1 IMPLANT
SOL PREP POV-IOD 4OZ 10% (MISCELLANEOUS) ×2 IMPLANT
SUT ETHILON 3 0 PS 1 (SUTURE) ×1 IMPLANT
SYR CONTROL 10ML LL (SYRINGE) IMPLANT
TOWEL GREEN STERILE (TOWEL DISPOSABLE) ×1 IMPLANT
TUBE CONNECTING 12X1/4 (SUCTIONS) ×1 IMPLANT
YANKAUER SUCT BULB TIP NO VENT (SUCTIONS) ×1 IMPLANT

## 2022-08-19 NOTE — Anesthesia Preprocedure Evaluation (Addendum)
Anesthesia Evaluation  Patient identified by MRN, date of birth, ID band Patient awake    Reviewed: Allergy & Precautions, NPO status , Patient's Chart, lab work & pertinent test results  Airway Mallampati: II  TM Distance: >3 FB Neck ROM: Full    Dental no notable dental hx.    Pulmonary former smoker   Pulmonary exam normal        Cardiovascular hypertension,  Rhythm:Regular Rate:Normal     Neuro/Psych CVA, No Residual Symptoms  negative psych ROS   GI/Hepatic ,GERD  ,,  Endo/Other  diabetes, Type 2, Insulin Dependent    Renal/GU CRFRenal disease  negative genitourinary   Musculoskeletal Osteomyelitis toe   Abdominal Normal abdominal exam  (+)   Peds  Hematology  (+) Blood dyscrasia, anemia   Anesthesia Other Findings   Reproductive/Obstetrics                             Anesthesia Physical Anesthesia Plan  ASA: 3  Anesthesia Plan: MAC   Post-op Pain Management:    Induction: Intravenous  PONV Risk Score and Plan: 1 and Ondansetron, Dexamethasone, Midazolam, Treatment may vary due to age or medical condition and Propofol infusion  Airway Management Planned: Simple Face Mask, Natural Airway and Nasal Cannula  Additional Equipment: None  Intra-op Plan:   Post-operative Plan:   Informed Consent: I have reviewed the patients History and Physical, chart, labs and discussed the procedure including the risks, benefits and alternatives for the proposed anesthesia with the patient or authorized representative who has indicated his/her understanding and acceptance.     Dental advisory given  Plan Discussed with: CRNA  Anesthesia Plan Comments:        Anesthesia Quick Evaluation

## 2022-08-19 NOTE — Progress Notes (Signed)
Orthopedic Tech Progress Note Patient Details:  [name] 1951/04/20 [id number]  Ortho Devices Type of Ortho Device: Postop shoe/boot Ortho Device/Splint Location: Right foot Ortho Device/Splint Interventions: Application   Post Interventions Patient Tolerated: Well  [name] 08/19/2022, 6:42 PM

## 2022-08-19 NOTE — Discharge Summary (Signed)
Physician Discharge Summary   Patient: [name] MRN: [id number] DOB: 08-02-1951  Admit date:     08/16/2022  Discharge date: 08/19/22  Discharge Physician: Estill Cotta, MD    PCP: Coral Spikes, DO   Recommendations at discharge:   Bone biopsy, culture done on 11/18, will be followed by Dr Candiss Norse (ID) and podiatry  Placed on doxycycline 100 mg p.o. twice daily, Augmentin 1 tab p.o. twice daily for 2 weeks Outpatient follow-up scheduled with ID, Dr Candiss Norse further antibiotic course per infectious disease  Discharge Diagnoses:    Osteomyelitis of great toe of right foot ([place])   HTN (hypertension)   Type 2 diabetes mellitus with peripheral neuropathy (HCC)   Anemia in chronic kidney disease   CKD (chronic kidney disease) stage 5, GFR less than 15 ml/min (HCC)   Ulcer of toe of right foot, with necrosis of bone [facility])   Hospital Course: Mr. Rodarte, a 71 y/o with DM on insulin, HLD, HTN, CKD 5, prior h/o toe/foot infections requiring amputation has had a non-healing right great toe ulcer for 3 weeks followed by [facility] and his PCP Dr. Lacinda Axon. Due to non-healing despite oral abx he had outpatient MRI right foot revealing osteomyelitis medial head and 1st proximal phalanx right great toe. Podiatry referred him top [facility] for admission for IV abx.   ED Course: afebrile. 152/89  HR 81. Lab: Glucose 11, BUN/Cr  44/476  Lactic acid 1.4. WBC 9.1 with nl Diff. [facility] called to admit for IV abx. VAnc and Cefepime started in ED    Assessment and Plan:   Osteomyelitis of great toe of right foot ([place]) - presented with nonhealing toe ulcer with osteomyelitis 1st proximal phalanx by MRI  - patient was started on vanc and cefepime however ID recommended to hold off antibiotics until bone biopsy and culture. - Patient refusing to have toe amputation, underwent Bone biopsy and culture - started doxycyline and augmentin after the bone bx and culture for 2-weeks - further antibiotic duration to be  determined at outpatient follow-up with ID, Dr Candiss Norse scheduled on 11/30         HTN (hypertension) - BP stable, cont amlodipine     Type 2 diabetes mellitus with peripheral neuropathy ([place]) - HbA1C 7.7%  - cont sliding scale insulin while inpatient, resume outpatient regimen upon discharge.      Anemia in chronic kidney disease - H/H stable    AKI  on CKD (chronic kidney disease) stage 5, GFR less than 15 ml/min (HCC) - baseline cr 3.8-4.3, last cr 3.95 pn 05/12/22 - presented with Cr 4.76, Demadex was used on hold and patient was placed on gentle IV fluid hydration, creatinine improving to 4.1     Pain control - [employer] Controlled Substance Reporting System database was reviewed. and patient was instructed, not to drive, operate heavy machinery, perform activities at heights, swimming or participation in water activities or provide baby-sitting services while on Pain, Sleep and Anxiety Medications; until their outpatient Physician has advised to do so again. Also recommended to not to take more than prescribed Pain, Sleep and Anxiety Medications.  Consultants: Podiatry, ID  Procedures performed: Bone biopsy and culture  Disposition: Home Diet recommendation:   DISCHARGE MEDICATION: Allergies as of 08/19/2022       Reactions   Losartan Potassium Other (See Comments)   hyperkalemia   Sildenafil Other (See Comments)   Not effective. "Did not like the way it made me feel"  Medication List     STOP taking these medications    torsemide 20 MG tablet Commonly known as: DEMADEX       TAKE these medications    amLODipine 10 MG tablet Commonly known as: NORVASC Take 10 mg by mouth daily.   amoxicillin-clavulanate 875-125 MG tablet Commonly known as: AUGMENTIN Take 1 tablet by mouth 2 (two) times daily for 15 days.   aspirin EC 81 MG tablet Take 81 mg by mouth daily. Swallow whole.   atorvastatin 80 MG tablet Commonly known as: LIPITOR Take 80 mg by  mouth daily.   calcitRIOL 0.25 MCG capsule Commonly known as: ROCALTROL Take 0.25 mcg by mouth daily.   docusate sodium 100 MG capsule Commonly known as: COLACE Take 100 mg by mouth 2 (two) times daily.   doxycycline 100 MG capsule Commonly known as: Vibramycin Take 1 capsule (100 mg total) by mouth 2 (two) times daily for 15 days.   Dulaglutide 4.5 MG/0.5ML Sopn Inject 4.5 mg into the skin every Sunday.   NovoLOG Mix 70/30 FlexPen (70-30) 100 UNIT/ML FlexPen Generic drug: insulin aspart protamine - aspart Inject 32-34 Units into the skin 2 (two) times daily with a meal.   traMADol 50 MG tablet Commonly known as: ULTRAM Take 1 tablet (50 mg total) by mouth every 8 (eight) hours as needed for severe pain or moderate pain.        Follow-up Information     Laurice Record, MD Follow up on 08/31/2022.   Specialty: Infectious Diseases Why: at [place] information: [address] [place] [address] [phone]         Yevonne Pax, DPM. Schedule an appointment as soon as possible for a visit in 2 week(s).   Specialty: Podiatry Why: for hospital follow-up Contact information: [address] [place] [address] [phone]                Discharge Exam: Danley Danker Weights   08/17/22 1558  Weight: 89.9 kg   S: doing well. No complaints, looking forward to discharge today   Vitals:   08/19/22 1157 08/19/22 1159 08/19/22 1300 08/19/22 1315  BP:  (!) 146/71 101/65 119/74  Pulse:  84 98 85  Resp:  '18 12 14  '$ Temp: 97.8 F (36.6 C)  97.8 F (36.6 C) 97.8 F (36.6 C)  TempSrc: Oral     SpO2:  98% 95% 95%  Weight:      Height:       Physical Exam General: Alert and oriented x 3, NAD Cardiovascular: S1 S2 clear, RRR.  Respiratory: CTAB, no wheezing, rales Gastrointestinal: Soft, nontender, nondistended, NBS Ext: no pedal edema bilaterally Neuro: no new deficits Psych: Normal affect   Condition at  discharge: fair  The results of significant diagnostics from this hospitalization (including imaging, microbiology, ancillary and laboratory) are listed below for reference.   Imaging Studies: MR TOES RIGHT WO CONTRAST  Result Date: 08/16/2022 CLINICAL DATA:  Diabetic foot wound.  Right great toe wound. EXAM: MRI OF THE RIGHT TOES WITHOUT CONTRAST TECHNIQUE: Multiplanar, multisequence MR imaging of the right forefoot was performed. No intravenous contrast was administered. COMPARISON:  Radiographs 07/11/2022 FINDINGS: Bones/Joint/Cartilage Skin ulceration is noted along the dorsal and medial aspect of the great toe at the level of the interphalangeal joint. There is cortical destruction of the distal 1st phalanx associated with diffusely increased T2 and decreased T1 signal, consistent with osteomyelitis. There are similar marrow changes within the  medial head of the 1st proximal phalanx. No significant intervening joint effusion. Moderate arthritic changes are present at the 1st metatarsophalangeal joint with joint space narrowing, osteophytes and erosions, possibly due to gout based on previous radiographs. The additional toes and metatarsals appear normal. The alignment appears normal at the Lisfranc joint. Ligaments The Lisfranc ligament and collateral ligaments of the metatarsophalangeal joints are intact. Muscles and Tendons Generalized forefoot muscular atrophy and mild T2 hyperintensity attributed to the patient's diabetes. No tendon rupture or significant tenosynovitis. Soft tissues Soft tissue ulceration of the great toe dorsally as described above. No focal fluid collection. Mild nonspecific diffuse dorsal subcutaneous edema. IMPRESSION: 1. Soft tissue ulceration along the dorsal and medial aspect of the great toe with associated osteomyelitis of the distal 1st phalanx and medial head of the 1st proximal phalanx. 2. Moderate arthritic changes at the 1st metatarsophalangeal joint with joint space  narrowing, osteophytes and erosions, possibly due to gout based on previous radiographs. 3. No evidence of soft tissue abscess. Electronically Signed   By: Richardean Sale M.D.   On: 08/16/2022 15:13    Microbiology: Results for orders placed or performed during the hospital encounter of 08/16/22  Blood Cultures x 2 sites     Status: None (Preliminary result)   Collection Time: 08/16/22  9:46 PM   Specimen: BLOOD  Result Value Ref Range Status   Specimen Description BLOOD LEFT ANTECUBITAL  Final   Special Requests   Final    BOTTLES DRAWN AEROBIC AND ANAEROBIC Blood Culture adequate volume   Culture   Final    NO GROWTH 3 DAYS Performed at [facility], [place] [address], [place], [place] [address]    Report Status PENDING  Incomplete  Culture, blood (single) w Reflex to ID Panel     Status: None (Preliminary result)   Collection Time: 08/17/22  5:12 PM   Specimen: BLOOD RIGHT ARM  Result Value Ref Range Status   Specimen Description BLOOD RIGHT ARM  Final   Special Requests   Final    BOTTLES DRAWN AEROBIC AND ANAEROBIC Blood Culture adequate volume   Culture   Final    NO GROWTH 2 DAYS Performed at [facility], [place] [address] [place], [place] [address]    Report Status PENDING  Incomplete    Labs: CBC: Recent Labs  Lab 08/16/22 2146 08/19/22 0401  WBC 9.1 7.9  NEUTROABS 5.5  --   HGB 12.0* 11.0*  HCT 36.5* 33.6*  MCV 89.0 87.0  PLT 235 449   Basic Metabolic Panel: Recent Labs  Lab 08/16/22 2146 08/18/22 1234 08/19/22 0401  NA 144 139 137  K 5.1 4.5 4.1  CL 112* 109 104  CO2 23 22 21*  GLUCOSE 111* 108* 121*  BUN 44* 42* 47*  CREATININE 4.76* 4.18* 4.14*  CALCIUM 9.0 8.6* 8.7*   Liver Function Tests: Recent Labs  Lab 08/16/22 2146  AST 33  ALT 37  ALKPHOS 68  BILITOT 0.5  PROT 7.2  ALBUMIN 3.3*   CBG: Recent Labs  Lab 08/18/22 1556 08/18/22 2040 08/19/22 0750 08/19/22 1136 08/19/22 1304  GLUCAP 184* 147* 107* 104* 94     Discharge time spent: greater than 30 minutes.  Signed: Estill Cotta, MD [facility] 08/19/2022

## 2022-08-19 NOTE — Transfer of Care (Signed)
Immediate Anesthesia Transfer of Care Note  Patient: [name]  Procedure(s) Performed: IRRIGATION AND DEBRIDEMENT RIGHT GREAT TOE GRAFT APPLICATION WITH BIOPSY (Right)  Patient Location: PACU  Anesthesia Type:MAC  Level of Consciousness: awake, alert , and oriented  Airway & Oxygen Therapy: Patient Spontanous Breathing and Patient connected to nasal cannula oxygen  Post-op Assessment: Report given to RN and Post -op Vital signs reviewed and stable  Post vital signs: Reviewed and stable  Last Vitals:  Vitals Value Taken Time  BP 101/65 08/19/22 1301  Temp    Pulse 94 08/19/22 1303  Resp 15 08/19/22 1303  SpO2 97 % 08/19/22 1303  Vitals shown include unvalidated device data.  Last Pain:  Vitals:   08/19/22 1157  TempSrc: Oral  PainSc: 0-No pain         Complications: No notable events documented.

## 2022-08-19 NOTE — Anesthesia Procedure Notes (Signed)
Procedure Name: MAC Date/Time: 08/19/2022 12:10 PM  Performed by: Anastasio Auerbach, CRNAPre-anesthesia Checklist: Emergency Drugs available, Suction available and Patient being monitored Patient Re-evaluated:Patient Re-evaluated prior to induction Oxygen Delivery Method: Nasal cannula and Simple face mask Induction Type: IV induction Airway Equipment and Method: Oral airway

## 2022-08-19 NOTE — Progress Notes (Signed)
Mobility Specialist Progress Note:   08/19/22 1619  Mobility  Activity Ambulated with assistance in hallway  Level of Assistance Standby assist, set-up cues, supervision of patient - no hands on  Assistive Device Front wheel walker;Wheelchair  Distance Ambulated (ft) 250 ft  Activity Response Tolerated fair;RN notified  Mobility Referral Yes  $Mobility charge 1 Mobility   Pt received in bed and agreeable. During ambulation, pt's incision began to bleed. No complaints of pain or discomfort. Pt placed in wheelchair and taken back to room. Pt left in bed with all needs met, call bell in reach. RN notified.   Andrey Campanile Mobility Specialist Please contact via SecureChat or  Rehab office at [phone]

## 2022-08-19 NOTE — Plan of Care (Signed)
Discharge held as Dr Sherryle Lis (podiatry service) recommended to stay overnight, will follow in am with prelim Culture data and dressing change .   Estill Cotta M.D.  [facility] 08/19/2022, 2:45 PM

## 2022-08-19 NOTE — Brief Op Note (Signed)
08/19/2022  12:56 PM  PATIENT:  [name]  71 y.o. male  PRE-OPERATIVE DIAGNOSIS:  OSTEOMYELITIS  RIGHT FOOT  POST-OPERATIVE DIAGNOSIS:  * No post-op diagnosis entered *  PROCEDURE:  Procedure(s): IRRIGATION AND DEBRIDEMENT RIGHT GREAT TOE GRAFT APPLICATION WITH BIOPSY (Right)  SURGEON:  Surgeon(s) and Role:    * Kohan Azizi, Stephan Minister, DPM - Primary  PHYSICIAN ASSISTANT:   ASSISTANTS: none   ANESTHESIA:   local and MAC  EBL:  10cc   BLOOD ADMINISTERED: none  DRAINS: none   LOCAL MEDICATIONS USED:  MARCAINE    and Amount: 10 ml  SPECIMEN:  R hallux distal and proximal phalanx   DISPOSITION OF SPECIMEN:  pathology and micro  COUNTS:  YES  TOURNIQUET:  * No tourniquets in log *  DICTATION: Note written in EPIC  PLAN OF CARE: Admit to inpatient   PATIENT DISPOSITION:  PACU - hemodynamically stable.   Delay start of Pharmacological VTE agent (>24hrs) due to surgical blood loss or risk of bleeding: no

## 2022-08-19 NOTE — Anesthesia Postprocedure Evaluation (Signed)
Anesthesia Post Note  Patient: [name]  Procedure(s) Performed: IRRIGATION AND DEBRIDEMENT RIGHT GREAT TOE GRAFT APPLICATION WITH BIOPSY (Right: Toe)     Patient location during evaluation: PACU Anesthesia Type: MAC Level of consciousness: awake and alert Pain management: pain level controlled Vital Signs Assessment: post-procedure vital signs reviewed and stable Respiratory status: spontaneous breathing, nonlabored ventilation, respiratory function stable and patient connected to nasal cannula oxygen Cardiovascular status: stable and blood pressure returned to baseline Postop Assessment: no apparent nausea or vomiting Anesthetic complications: no   No notable events documented.  Last Vitals:  Vitals:   08/19/22 1644 08/19/22 2019  BP: 137/69 (!) 152/70  Pulse: 77 81  Resp: 18 17  Temp: 36.8 C 36.8 C  SpO2: 100% 100%    Last Pain:  Vitals:   08/19/22 2019  TempSrc: Oral  PainSc:                  March Rummage Alauna Hayden

## 2022-08-19 NOTE — H&P (Signed)
History and Physical Interval Note:  08/19/2022 11:54 AM  [name]  has presented today for surgery, with the diagnosis of osteomyelitis right great toe.  The various methods of treatment have been discussed with the patient and family. After consideration of risks, benefits and other options for treatment, the patient has consented to   Procedure(s): IRRIGATION AND DEBRIDEMENT RIGHT GREAT TOE GRAFT APPLICATION WITH BIOPSY (Right) as a surgical intervention.  The patient's history has been reviewed, patient examined, no change in status, stable for surgery.  I have reviewed the patient's chart and labs.  Questions were answered to the patient's satisfaction.     Criselda Peaches

## 2022-08-19 NOTE — Op Note (Signed)
Patient Name: Saxon Barich DOB: Sep 03, 1951  MRN: [id number]   Date of Service: 08/16/2022 - 08/19/2022  Surgeon: Dr. Lanae Crumbly, DPM Assistants: None Pre-operative Diagnosis:  #1 osteomyelitis right great toe #2 nonhealing ulcer right great toe Post-operative Diagnosis:  #1 osteomyelitis right great toe #2 nonhealing ulcer right great toe Procedures: #1 preparation of wound bed for skin substitute #2 application skin substitute #3 bone biopsy open superficial x2    Pathology/Specimens: ID Type Source Tests Collected by Time Destination  1 : Right Great Toe Proximal Phalanx Tissue PATH Soft tissue SURGICAL PATHOLOGY Criselda Peaches, DPM 08/19/2022 1244   2 : Right Great Toe Distal Phalanx Tissue PATH Soft tissue SURGICAL PATHOLOGY Criselda Peaches, DPM 08/19/2022 1244   A : Right Great Toe Proximal Phalanx Tissue Soft Tissue, Other AEROBIC/ANAEROBIC CULTURE W Lonell Grandchild STAIN (SURGICAL/DEEP WOUND) Criselda Peaches, DPM 08/19/2022 1245   B : Right Great Toe Distal Phalanx Tissue Soft Tissue, Other AEROBIC/ANAEROBIC CULTURE W Lonell Grandchild STAIN (SURGICAL/DEEP WOUND) Criselda Peaches, DPM 08/19/2022 1246    Anesthesia: MAC with local Hemostasis: * No tourniquets in log * Estimated Blood Loss: 10 cc Materials:  Implant Name Type Inv. Item Serial No. Manufacturer Lot No. LRB No. Used Action  GRAFT MYRIAD 3 LAYER 5X5 - [id number] Graft GRAFT MYRIAD 3 LAYER 5X5  [employer] [id number] Right 1 Implanted   Medications: 10 cc 0.5% Marcaine plain Complications: No complication noted  Indications for Procedure:  This is a 71 y.o. male with a history of type 2 diabetes and ulcerations of the right hallux.  Preoperative MRI was concerning for osteomyelitis.  Amputation was recommended to the patient but he has declined this.  Bone biopsy and culture was recommended.  He has nonhealing ulcers on the distal tip of the hallux and medial portion of the proximal phalanx.  Debridement  preparation of the wound bed with application of skin substitute was also recommended to maximize potential for possible wound healing in the event his osteomyelitis can be successfully treated.   Procedure in Detail: Patient was identified in pre-operative holding area. Formal consent was signed and the right lower extremity was marked. Patient was brought back to the operating room. Anesthesia was induced. The extremity was prepped and draped in the usual sterile fashion. Timeout was taken to confirm patient name, laterality, and procedure prior to incision.   Attention was then directed to the right hallux where began by debriding in a full-thickness excisional manner the 2 separate ulcerations at the tip of the hallux and medial portion of the proximal phalanx distally.  Each ulceration measured approximately 1.5 cm in diameter and was circular.  All nonviable tissue and eschar was removed with a scalpel.  The margins of the Akin ulceration were extended in a linear incision to provide access to the underlying bone.  The wound was thoroughly irrigated with normal sterile saline.  An elevator was used to elevate the soft tissues from the tip of the distal phalanx and medial head of the proximal phalanx.  A rongeur was used to obtain a open bone biopsy and a portion of this was also sent for tissue culture from the 2 separate sites of the proximal and distal phalanx.  The wound was then irrigated again.  This portion of the extended incision was then closed with nylon.  A myriad matrix 3 layer matrix was then selected and cut to fit and inset into the wound bed.  It was secured with nylon.  It was  dressed with Adaptic surgical lube and saline moistened wet-to-dry gauze dressings with 4 x 4's Kerlix and an Ace wrap under light compression.  He tolerated procedure well.  Perfusion was excellent.  No gross purulence was found. Patient tolerated the procedure well.   Disposition: Following a period of  post-operative monitoring, patient will be transferred to the floor.

## 2022-08-20 DIAGNOSIS — M869 Osteomyelitis, unspecified: Secondary | ICD-10-CM | POA: Diagnosis not present

## 2022-08-20 DIAGNOSIS — N189 Chronic kidney disease, unspecified: Secondary | ICD-10-CM | POA: Diagnosis not present

## 2022-08-20 DIAGNOSIS — I159 Secondary hypertension, unspecified: Secondary | ICD-10-CM | POA: Diagnosis not present

## 2022-08-20 DIAGNOSIS — N185 Chronic kidney disease, stage 5: Secondary | ICD-10-CM | POA: Diagnosis not present

## 2022-08-20 DIAGNOSIS — L97514 Non-pressure chronic ulcer of other 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: Secondary | ICD-10-CM | POA: Diagnosis not present

## 2022-08-20 LAB — CBC
HCT: 34.4 % — ABNORMAL LOW (ref 39.0–52.0)
Hemoglobin: 11.3 g/dL — ABNORMAL LOW (ref 13.0–17.0)
MCH: 28.4 pg (ref 26.0–34.0)
MCHC: 32.8 g/dL (ref 30.0–36.0)
MCV: 86.4 fL (ref 80.0–100.0)
Platelets: 183 10*3/uL (ref 150–400)
RBC: 3.98 MIL/uL — ABNORMAL LOW (ref 4.22–5.81)
RDW: 13.6 % (ref 11.5–15.5)
WBC: 9.3 10*3/uL (ref 4.0–10.5)
nRBC: 0 % (ref 0.0–0.2)

## 2022-08-20 LAB — BASIC METABOLIC PANEL
Anion gap: 9 (ref 5–15)
BUN: 50 mg/dL — ABNORMAL HIGH (ref 8–23)
CO2: 22 mmol/L (ref 22–32)
Calcium: 8.6 mg/dL — ABNORMAL LOW (ref 8.9–10.3)
Chloride: 107 mmol/L (ref 98–111)
Creatinine, Ser: 4.31 mg/dL — ABNORMAL HIGH (ref 0.61–1.24)
GFR, Estimated: 14 mL/min — ABNORMAL LOW (ref >60)
Glucose, Bld: 123 mg/dL — ABNORMAL HIGH (ref 70–99)
Potassium: 5.1 mmol/L (ref 3.5–5.1)
Sodium: 138 mmol/L (ref 135–145)

## 2022-08-20 LAB — GLUCOSE, CAPILLARY: Glucose-Capillary: 147 mg/dL — ABNORMAL HIGH (ref 70–99)

## 2022-08-20 NOTE — Progress Notes (Signed)
  Subjective:  Patient ID: [name], male    DOB: 03/03/1951,  MRN: [id number]  Patient seen at bedside POD#1, resting comfortably, he did have pain overnight at 7/10  Negative for chest pain and shortness of breath Fever: no Night sweats: no Constitutional signs: no Objective:   Vitals:   08/20/22 0336 08/20/22 0831  BP: (!) 143/68 (!) 143/64  Pulse: 89 78  Resp: 17 18  Temp: 98.6 F (37 C) 97.9 F (36.6 C)  SpO2: 98% 100%   General AA&O x3. Normal mood and affect.  Vascular Dorsalis pedis and posterior tibial pulses 2/4 bilat. Brisk capillary refill to all digits. Pedal hair present.  Neurologic Epicritic sensation grossly reduced.  Dermatologic Dressing with some bloody strikethrough. Graft well adhered and good integrity  Orthopedic: MMT 5/5 in dorsiflexion, plantarflexion, inversion, and eversion. Normal joint ROM without pain or crepitus.   IMPRESSION: 1. Soft tissue ulceration along the dorsal and medial aspect of the great toe with associated osteomyelitis of the distal 1st phalanx and medial head of the 1st proximal phalanx. 2. Moderate arthritic changes at the 1st metatarsophalangeal joint with joint space narrowing, osteophytes and erosions, possibly due to gout based on previous radiographs. 3. No evidence of soft tissue abscess.     Electronically Signed   By: Richardean Sale M.D.   On: 08/16/2022 15:13 Assessment & Plan:  Patient was evaluated and treated and all questions answered.  Osteomyelitis right great toe -Dressing changed and showed his wife how to change dressings at home daily -Will be d/c on augmentin and doxy. Cultures NGTD. Will have OP ID F/U -F/u with me in 7-10 days. My office will arrange his POV -Stable for DC today   Criselda Peaches, DPM  Accessible via secure chat for questions or concerns.

## 2022-08-20 NOTE — Discharge Summary (Signed)
Physician Discharge Summary   Patient: [name] MRN: [id number] DOB: November 01, 1950  Admit date:     08/16/2022  Discharge date: 08/20/22  Discharge Physician: [name] Cotta, MD    PCP: [name] Spikes, DO   Recommendations at discharge:   Bone biopsy, culture done on 11/18, will be followed by [name] (ID) and podiatry  Continue doxycycline 100 mg p.o. twice daily, Augmentin 1 tab p.o. twice daily for 2 weeks Outpatient follow-up scheduled with ID, [name] further antibiotic course per infectious disease.   Discharge Diagnoses:    Osteomyelitis of great toe of right foot (HCC)   HTN (hypertension)   Type 2 diabetes mellitus with peripheral neuropathy (HCC)   Anemia in chronic kidney disease   CKD (chronic kidney disease) stage 5, GFR less than 15 ml/min (HCC)   Ulcer of toe of right foot, with necrosis of bone [facility])   Hospital Course: [name], a 71 y/o with DM on insulin, HLD, HTN, CKD 5, prior h/o toe/foot infections requiring amputation has had a non-healing right great toe ulcer for 3 weeks followed by [facility] and his PCP [name]. Lacinda [name]. Due to non-healing despite oral abx he had outpatient MRI right foot revealing osteomyelitis medial head and 1st proximal phalanx right great toe. Podiatry referred [facility] top [facility] for admission for IV abx.   ED Course: afebrile. 152/89  HR 81. Lab: Glucose 11, BUN/Cr  44/476  Lactic acid 1.4. WBC 9.1 with nl Diff. TRH called to admit for IV abx. VAnc and Cefepime started in ED    Assessment and Plan:   Osteomyelitis of great toe of right foot ([place]) - presented with nonhealing toe ulcer with osteomyelitis 1st proximal phalanx by MRI  - patient was started on vanc and cefepime however ID recommended to hold off antibiotics until bone biopsy and culture. - Patient refusing to have toe amputation, underwent Bone biopsy and culture on 11/18 - started doxycyline and augmentin after the bone bx and culture for 2-weeks - further antibiotic  duration to be determined at outpatient follow-up with ID, [name] scheduled on 11/30 -Seen by podiatry, [name], cleared for discharge, dressing changed.  Office will call tomorrow for the appointment for follow-up         HTN (hypertension) - BP stable, cont amlodipine     Type 2 diabetes mellitus with peripheral neuropathy (HCC) - HbA1C 7.7%  - cont sliding scale insulin while inpatient, resume outpatient regimen upon discharge.      Anemia in chronic kidney disease - H/H stable, hemoglobin 11.3   AKI  on CKD (chronic kidney disease) stage 5, GFR less than 15 ml/min (HCC) - baseline cr 3.8-4.3, last cr 3.95 pn 05/12/22 - presented with Cr 4.76, Demadex was used on hold and patient was placed on gentle IV fluid hydration -Creatinine has improved to 4.3 upon discharge, at baseline recommended to follow-up with his nephrologist outpatient     Pain control - [facility] Controlled Substance Reporting System database was reviewed. and patient was instructed, not to drive, operate heavy machinery, perform activities at heights, swimming or participation in water activities or provide baby-sitting services while on Pain, Sleep and Anxiety Medications; until their outpatient Physician has advised to do so again. Also recommended to not to take more than prescribed Pain, Sleep and Anxiety Medications.  Consultants: Podiatry, ID  Procedures performed: Bone biopsy and culture on 11/18 Disposition: Home Diet recommendation:   DISCHARGE MEDICATION: Allergies as of 08/20/2022  Reactions   Losartan Potassium Other (See Comments)   hyperkalemia   Sildenafil Other (See Comments)   Not effective. "Did not like the way it made me feel"        Medication List     STOP taking these medications    torsemide 20 MG tablet Commonly known as: DEMADEX       TAKE these medications    amLODipine 10 MG tablet Commonly known as: NORVASC Take 10 mg by mouth daily.    amoxicillin-clavulanate 875-125 MG tablet Commonly known as: AUGMENTIN Take 1 tablet by mouth 2 (two) times daily for 15 days.   aspirin EC 81 MG tablet Take 81 mg by mouth daily. Swallow whole.   atorvastatin 80 MG tablet Commonly known as: LIPITOR Take 80 mg by mouth daily.   calcitRIOL 0.25 MCG capsule Commonly known as: ROCALTROL Take 0.25 mcg by mouth daily.   docusate sodium 100 MG capsule Commonly known as: COLACE Take 100 mg by mouth 2 (two) times daily.   doxycycline 100 MG capsule Commonly known as: Vibramycin Take 1 capsule (100 mg total) by mouth 2 (two) times daily for 15 days.   Dulaglutide 4.5 MG/0.5ML Sopn Inject 4.5 mg into the skin every Sunday.   NovoLOG Mix 70/30 FlexPen (70-30) 100 UNIT/ML FlexPen Generic drug: insulin aspart protamine - aspart Inject 32-34 Units into the skin 2 (two) times daily with a meal.   traMADol 50 MG tablet Commonly known as: ULTRAM Take 1 tablet (50 mg total) by mouth every 8 (eight) hours as needed for severe pain or moderate pain.        Follow-up Information     [name] Record, MD Follow up on 08/31/2022.   Specialty: Infectious Diseases Why: at [place] information: [address] [place] [address] [phone]         [name], DPM. Schedule an appointment as soon as possible for a visit in 2 week(s).   Specialty: Podiatry Why: for hospital follow-up Contact information: [address] [place] [address] [phone]                Discharge Exam: Danley Danker Weights   08/17/22 1558  Weight: 89.9 kg   S: Dressing changed this morning, cleared by podiatry to discharge home.  Wife at the bedside.  Vitals:   08/19/22 2019 08/19/22 2348 08/20/22 0336 08/20/22 0831  BP: (!) 152/70 (!) 155/78 (!) 143/68 (!) 143/64  Pulse: 81 86 89 78  Resp: '17 19 17 18  '$ Temp: 98.2 F (36.8 C) 98 F (36.7 C) 98.6 F (37 C) 97.9 F (36.6 C)  TempSrc:  Oral Oral Oral Oral  SpO2: 100% 98% 98% 100%  Weight:      Height:        Physical Exam General: Alert and oriented x 3, NAD Cardiovascular: S1 S2 clear, RRR.  Respiratory: CTAB, no wheezing, rales or rhonchi Gastrointestinal: Soft, nontender, nondistended, NBS Ext: no pedal edema bilaterally Neuro: no new deficits Skin: Left foot dressing intact Psych: Normal affect   Condition at discharge: fair  The results of significant diagnostics from this hospitalization (including imaging, microbiology, ancillary and laboratory) are listed below for reference.   Imaging Studies: MR TOES RIGHT WO CONTRAST  Result Date: 08/16/2022 CLINICAL DATA:  Diabetic foot wound.  Right great toe wound. EXAM: MRI OF THE RIGHT TOES WITHOUT CONTRAST TECHNIQUE: Multiplanar, multisequence MR imaging of the right forefoot was performed. No intravenous contrast was  administered. COMPARISON:  Radiographs 07/11/2022 FINDINGS: Bones/Joint/Cartilage Skin ulceration is noted along the dorsal and medial aspect of the great toe at the level of the interphalangeal joint. There is cortical destruction of the distal 1st phalanx associated with diffusely increased T2 and decreased T1 signal, consistent with osteomyelitis. There are similar marrow changes within the medial head of the 1st proximal phalanx. No significant intervening joint effusion. Moderate arthritic changes are present at the 1st metatarsophalangeal joint with joint space narrowing, osteophytes and erosions, possibly due to gout based on previous radiographs. The additional toes and metatarsals appear normal. The alignment appears normal at the Lisfranc joint. Ligaments The Lisfranc ligament and collateral ligaments of the metatarsophalangeal joints are intact. Muscles and Tendons Generalized forefoot muscular atrophy and mild T2 hyperintensity attributed to the patient's diabetes. No tendon rupture or significant tenosynovitis. Soft tissues Soft tissue ulceration  of the great toe dorsally as described above. No focal fluid collection. Mild nonspecific diffuse dorsal subcutaneous edema. IMPRESSION: 1. Soft tissue ulceration along the dorsal and medial aspect of the great toe with associated osteomyelitis of the distal 1st phalanx and medial head of the 1st proximal phalanx. 2. Moderate arthritic changes at the 1st metatarsophalangeal joint with joint space narrowing, osteophytes and erosions, possibly due to gout based on previous radiographs. 3. No evidence of soft tissue abscess. Electronically Signed   By: Richardean Sale M.D.   On: 08/16/2022 15:13    Microbiology: Results for orders placed or performed during the hospital encounter of 08/16/22  Blood Cultures x 2 sites     Status: None (Preliminary result)   Collection Time: 08/16/22  9:46 PM   Specimen: BLOOD  Result Value Ref Range Status   Specimen Description BLOOD LEFT ANTECUBITAL  Final   Special Requests   Final    BOTTLES DRAWN AEROBIC AND ANAEROBIC Blood Culture adequate volume   Culture   Final    NO GROWTH 4 DAYS Performed at [facility], [place] [address], [place], [place] [address]    Report Status PENDING  Incomplete  Culture, blood (single) w Reflex to ID Panel     Status: None (Preliminary result)   Collection Time: 08/17/22  5:12 PM   Specimen: BLOOD RIGHT ARM  Result Value Ref Range Status   Specimen Description BLOOD RIGHT ARM  Final   Special Requests   Final    BOTTLES DRAWN AEROBIC AND ANAEROBIC Blood Culture adequate volume   Culture   Final    NO GROWTH 3 DAYS Performed at [facility], [place] [address], [place], [place] [address]    Report Status PENDING  Incomplete  Aerobic/Anaerobic Culture w Gram Stain (surgical/deep wound)     Status: None (Preliminary result)   Collection Time: 08/19/22 12:45 PM   Specimen: Soft Tissue, Other  Result Value Ref Range Status   Specimen Description BONE  Final   Special Requests RIGHT GREAT TOE PROXIMAL PHALANX  Final    Gram Stain NO ORGANISMS SEEN NO WBC SEEN   Final   Culture   Final    NO GROWTH < 24 HOURS Performed at [facility], [place] [address] [place], [place] [address]    Report Status PENDING  Incomplete  Aerobic/Anaerobic Culture w Gram Stain (surgical/deep wound)     Status: None (Preliminary result)   Collection Time: 08/19/22 12:46 PM   Specimen: Soft Tissue, Other  Result Value Ref Range Status   Specimen Description BONE  Final   Special Requests  RIGHT GREAT TOE DISTAL PHALANX  Final   Gram Stain NO WBC SEEN NO ORGANISMS SEEN   Final   Culture   Final    NO GROWTH < 24 HOURS Performed at [facility], [place] [address] [place], [place] [address]    Report Status PENDING  Incomplete    Labs: CBC: Recent Labs  Lab 08/16/22 2146 08/19/22 0401 08/20/22 0650  WBC 9.1 7.9 9.3  NEUTROABS 5.5  --   --   HGB 12.0* 11.0* 11.3*  HCT 36.5* 33.6* 34.4*  MCV 89.0 87.0 86.4  PLT 235 186 171   Basic Metabolic Panel: Recent Labs  Lab 08/16/22 2146 08/18/22 1234 08/19/22 0401 08/20/22 0650  NA 144 139 137 138  K 5.1 4.5 4.1 5.1  CL 112* 109 104 107  CO2 23 22 21* 22  GLUCOSE 111* 108* 121* 123*  BUN 44* 42* 47* 50*  CREATININE 4.76* 4.18* 4.14* 4.31*  CALCIUM 9.0 8.6* 8.7* 8.6*   Liver Function Tests: Recent Labs  Lab 08/16/22 2146  AST 33  ALT 37  ALKPHOS 68  BILITOT 0.5  PROT 7.2  ALBUMIN 3.3*   CBG: Recent Labs  Lab 08/19/22 1136 08/19/22 1304 08/19/22 1641 08/19/22 2120 08/20/22 0829  GLUCAP 104* 94 144* 140* 147*    Discharge time spent: greater than 30 minutes.  Signed: Estill Cotta, MD [facility] 08/20/2022

## 2022-08-21 ENCOUNTER — Encounter (HOSPITAL_COMMUNITY): Payer: Self-pay | Admitting: Podiatry

## 2022-08-21 LAB — CULTURE, BLOOD (ROUTINE X 2)
Culture: NO GROWTH
Special Requests: ADEQUATE

## 2022-08-22 LAB — CULTURE, BLOOD (SINGLE)
Culture: NO GROWTH
Special Requests: ADEQUATE

## 2022-08-22 LAB — SURGICAL PATHOLOGY

## 2022-08-24 LAB — AEROBIC/ANAEROBIC CULTURE W GRAM STAIN (SURGICAL/DEEP WOUND)
Culture: NO GROWTH
Gram Stain: NONE SEEN
Gram Stain: NONE SEEN

## 2022-08-28 ENCOUNTER — Encounter (HOSPITAL_COMMUNITY): Payer: No Typology Code available for payment source

## 2022-08-29 ENCOUNTER — Ambulatory Visit (HOSPITAL_COMMUNITY)
Admission: RE | Admit: 2022-08-29 | Discharge: 2022-08-29 | Disposition: A | Discharge status: Home / Self Care | Payer: No Typology Code available for payment source | Source: Ambulatory Visit | Attending: Podiatry | Admitting: Podiatry

## 2022-08-29 DIAGNOSIS — L97512 Non-pressure chronic ulcer of other part of right foot with fat layer exposed: Secondary | ICD-10-CM | POA: Insufficient documentation

## 2022-08-30 ENCOUNTER — Encounter: Payer: Self-pay | Admitting: Vascular Surgery

## 2022-08-30 ENCOUNTER — Ambulatory Visit (INDEPENDENT_AMBULATORY_CARE_PROVIDER_SITE_OTHER): Payer: No Typology Code available for payment source | Admitting: Vascular Surgery

## 2022-08-30 VITALS — BP 163/79 | HR 103 | Temp 98.2°F | Ht 72.0 in | Wt 196.8 lb

## 2022-08-30 DIAGNOSIS — L089 Local infection of the skin and subcutaneous tissue, unspecified: Secondary | ICD-10-CM

## 2022-08-30 DIAGNOSIS — E11628 Type 2 diabetes mellitus with other skin complications: Secondary | ICD-10-CM | POA: Diagnosis not present

## 2022-08-30 NOTE — Progress Notes (Signed)
[facility]ist of [name]  Patient name: [name] MRN: [id number] DOB: 1951-09-11 Sex: male  REASON FOR CONSULT: Evaluation adequacy arterial flow to right foot  HPI: [name] is a 71 y.o. male, who is here today for evaluation.  He is here today with his wife.  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  He had an episode of blistering on his right great toe.  He has severe neuropathy and did not know that the blister was present.  He has had progressive tissue loss and infection.  He did undergo MRI suggesting osteomyelitis.  He has undergone debridement with Dr. Sherryle Lis and is here today to assess adequacy of arterial flow.  Does not have any claudication symptoms.  He does have severe renal insufficiency with a creatinine of 4.  He is not on dialysis currently.  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 No pertinent family history.  SOCIAL HISTORY: Social History   Socioeconomic History   Marital status: Married    Spouse name: Not on file   Number of children: Not on file   Years of education: Not on file   Highest education level: Not on file  Occupational History   Not on file  Tobacco Use   Smoking status: Former   Smokeless tobacco: Never  Vaping Use   Vaping Use: Never used  Substance and Sexual Activity   Alcohol use: Not Currently   Drug use: Never   Sexual activity: Not on file  Other Topics Concern   Not on file   Social History Narrative   Not on file   Social Determinants of Health   Financial Resource Strain: Not on file  Food Insecurity: Not on file  Transportation Needs: No Transportation Needs (08/17/2022)   PRAPARE - Hydrologist (Medical): No    Lack of Transportation (Non-Medical): No  Physical Activity: Not on file  Stress: Not on file  Social Connections: Not on file  Intimate Partner Violence: Not on file    Allergies  Allergen Reactions   Losartan Potassium Other (See Comments)    hyperkalemia   Sildenafil Other (See Comments)    Not effective. "Did not like the way it made me feel"    Current Outpatient Medications  Medication Sig Dispense Refill   amLODipine (NORVASC) 10 MG tablet Take 10 mg by mouth daily.     amoxicillin-clavulanate (AUGMENTIN) 875-125 MG tablet Take 1 tablet by mouth 2 (two) times daily for 15 days. 30 tablet 0   aspirin EC 81 MG tablet Take 81 mg by mouth daily. Swallow whole.     atorvastatin (LIPITOR) 80 MG tablet Take 80 mg by mouth daily.     calcitRIOL (ROCALTROL) 0.25 MCG capsule Take 0.25 mcg by mouth daily.     docusate sodium (COLACE) 100 MG capsule Take 100 mg by mouth 2 (two) times daily.  doxycycline (VIBRAMYCIN) 100 MG capsule Take 1 capsule (100 mg total) by mouth 2 (two) times daily for 15 days. 30 capsule 0   Dulaglutide 4.5 MG/0.5ML SOPN Inject 4.5 mg into the skin every Sunday.     NOVOLOG MIX 70/30 FLEXPEN (70-30) 100 UNIT/ML FlexPen Inject 32-34 Units into the skin 2 (two) times daily with a meal.     traMADol (ULTRAM) 50 MG tablet Take 1 tablet (50 mg total) by mouth every 8 (eight) hours as needed for severe pain or moderate pain. 28 tablet 0   No current facility-administered medications for this visit.    REVIEW OF SYSTEMS:  '[X]'$  denotes positive finding, '[ ]'$  denotes negative finding Cardiac  Comments:  Chest pain or chest pressure:    Shortness of breath upon exertion:    Short of breath  when lying flat:    Irregular heart rhythm:        Vascular    Pain in calf, thigh, or hip brought on by ambulation:    Pain in feet at night that wakes you up from your sleep:  x   Blood clot in your veins:    Leg swelling:         Pulmonary    Oxygen at home:    Productive cough:     Wheezing:         Neurologic    Sudden weakness in arms or legs:     Sudden numbness in arms or legs:     Sudden onset of difficulty speaking or slurred speech:    Temporary loss of vision in one eye:     Problems with dizziness:         Gastrointestinal    Blood in stool:     Vomited blood:         Genitourinary    Burning when urinating:     Blood in urine:        Psychiatric    Major depression:         Hematologic    Bleeding problems:    Problems with blood clotting too easily:        Skin    Rashes or ulcers:        Constitutional    Fever or chills:      PHYSICAL EXAM: Vitals:   08/30/22 1417  BP: (!) 163/79  Pulse: (!) 103  Temp: 98.2 F (36.8 C)  SpO2: 99%  Weight: 196 lb 12.8 oz (89.3 kg)  Height: 6' (1.829 m)    GENERAL: The patient is a well-nourished male, in no acute distress. The vital signs are documented above. CARDIOVASCULAR: He does have a 2+ right dorsalis pedis pulse. PULMONARY: There is good air exchange  MUSCULOSKELETAL: There are no major deformities or cyanosis. NEUROLOGIC: No focal weakness or paresthesias are detected. SKIN: Full-thickness loss on the distal portion of his right great toe.  He does have sutures in place for recent debridement.  Also has sutures in place from my debridement of the medial aspect of his right great toe. PSYCHIATRIC: The patient has a normal affect.  DATA:  He underwent noninvasive studies in our [facility] office yesterday showing normal ankle arm index.  This may be falsely elevated related to calcification of his vessels.  He does have normal multiphasic waveforms bilaterally.  This study is unchanged from a  prior similar study in January 2023.  MEDICAL ISSUES: Normal arterial flow to the level of the dorsalis pedis on the right.  Persistent dry gangrenous changes in his right great toe with MRI demonstration of osteomyelitis.  Do not see any role for further evaluation.  I did explain that he still may have difficulty healing toe debridement and even amputation related to small vessel disease.  We will follow-up with [place] on an as needed basis   Rosetta Posner, MD [facility] [phone] Pager [phone]  Note: Portions of this report may have been transcribed using voice recognition software.  Every effort has been made to ensure accuracy; however, inadvertent computerized transcription errors may still be present.

## 2022-08-31 ENCOUNTER — Encounter: Payer: Self-pay | Admitting: Internal Medicine

## 2022-08-31 ENCOUNTER — Ambulatory Visit (INDEPENDENT_AMBULATORY_CARE_PROVIDER_SITE_OTHER): Payer: No Typology Code available for payment source | Admitting: Internal Medicine

## 2022-08-31 ENCOUNTER — Ambulatory Visit (INDEPENDENT_AMBULATORY_CARE_PROVIDER_SITE_OTHER): Payer: No Typology Code available for payment source

## 2022-08-31 ENCOUNTER — Ambulatory Visit (INDEPENDENT_AMBULATORY_CARE_PROVIDER_SITE_OTHER): Payer: No Typology Code available for payment source | Admitting: Podiatry

## 2022-08-31 ENCOUNTER — Other Ambulatory Visit: Payer: Self-pay

## 2022-08-31 VITALS — BP 169/82 | HR 109 | Temp 97.6°F | Wt 192.0 lb

## 2022-08-31 DIAGNOSIS — M869 Osteomyelitis, unspecified: Secondary | ICD-10-CM

## 2022-08-31 DIAGNOSIS — L97512 Non-pressure chronic ulcer of other part of right foot with fat layer exposed: Secondary | ICD-10-CM

## 2022-08-31 DIAGNOSIS — E1169 Type 2 diabetes mellitus with other specified complication: Secondary | ICD-10-CM

## 2022-08-31 NOTE — Progress Notes (Signed)
  Subjective:  Patient ID: [name], male    DOB: 01-05-1951,  MRN: [id number]  Chief Complaint  Patient presents with   Routine Post Op    Pov #1 DOS 08/19/22 IRRIGATION AND DEBRIDEMENT RIGHT GREAT TOE GRAFT APPLICATION WITH BIOPSY     71 y.o. male returns for post-op check.  Overall is doing well is not having much pain  Review of Systems: Negative except as noted in the HPI. Denies N/V/F/Ch.   Objective:  There were no vitals filed for this visit. There is no height or weight on file to calculate BMI. Constitutional Well developed. Well nourished.  Vascular Foot warm and well perfused. Capillary refill normal to all digits.  Calf is soft and supple, no posterior calf or knee pain, negative Homans' sign  Neurologic Normal speech. Oriented to person, place, and time. Epicritic sensation to light touch grossly reduced bilaterally.  Dermatologic Skin healing well without signs of infection.  Graft site intact shows some desiccation  Orthopedic: He has little to no tenderness to palpation noted about the surgical site.   Multiple view plain film radiographs: Interval biopsy resection of distal tuft phalanx  Pathology biopsies were negative, one of the bone cultures was positive for Staphylococcus Assessment:   1. Ulcer of right foot with fat layer exposed ([place])    Plan:  Patient was evaluated and treated and all questions answered.  S/p foot surgery right -Progressing as expected post-operatively.  Graft shows some desiccation advised him to begin twice daily changes with the surgical appointment -WB Status: WBAT to heel in postop shoe -Sutures: Return in 2 weeks with me for removal and graft evaluation. -Medications: No refills required -Foot redressed. -He has follow-up with infectious disease today  No follow-ups on file.

## 2022-08-31 NOTE — Progress Notes (Signed)
Patient Active Problem List   Diagnosis Date Noted   Ulcer of toe of right foot, with necrosis of bone ([place]) 08/18/2022   CKD (chronic kidney disease) stage 5, GFR less than 15 ml/min ([place]) 07/12/2022   Former smoker 07/12/2022   History of stroke 07/12/2022   Osteomyelitis of great toe of right foot ([place]) 07/12/2022   Diabetic neuropathy ([place]) 10/28/2021   Anemia in chronic kidney disease 05/27/2020   History of amputation of lesser toe of left foot ([place]) 08/18/2015   Hyperlipidemia, mixed 08/18/2015   Type 2 diabetes mellitus with peripheral neuropathy ([place]) 08/18/2015   HTN (hypertension) 04/17/2013    Patient's Medications  New Prescriptions   No medications on file  Previous Medications   AMLODIPINE (NORVASC) 10 MG TABLET    Take 10 mg by mouth daily.   AMOXICILLIN-CLAVULANATE (AUGMENTIN) 875-125 MG TABLET    Take 1 tablet by mouth 2 (two) times daily for 15 days.   ASPIRIN EC 81 MG TABLET    Take 81 mg by mouth daily. Swallow whole.   ATORVASTATIN (LIPITOR) 80 MG TABLET    Take 80 mg by mouth daily.   CALCITRIOL (ROCALTROL) 0.25 MCG CAPSULE    Take 0.25 mcg by mouth daily.   DOCUSATE SODIUM (COLACE) 100 MG CAPSULE    Take 100 mg by mouth 2 (two) times daily.   DOXYCYCLINE (VIBRAMYCIN) 100 MG CAPSULE    Take 1 capsule (100 mg total) by mouth 2 (two) times daily for 15 days.   DULAGLUTIDE 4.5 MG/0.5ML SOPN    Inject 4.5 mg into the skin every Sunday.   NOVOLOG MIX 70/30 FLEXPEN (70-30) 100 UNIT/ML FLEXPEN    Inject 32-34 Units into the skin 2 (two) times daily with a meal.   TRAMADOL (ULTRAM) 50 MG TABLET    Take 1 tablet (50 mg total) by mouth every 8 (eight) hours as needed for severe pain or moderate pain.  Modified Medications   No medications on file  Discontinued Medications   No medications on file    Subjective: 71 YM with PMHx as below(including A1c 8.3), CKD stage IV presents for hospital follow-up of left foot osteomyelitis. He was admitted  1/9-1/19/23 to [facility] with worsening diabetic foot wound. He started on bactrim prior to admission. Taken to OR on 1/9 for I7D and 1st metatarsal resection on 1/11 OR Cx + strep anginosis, proteus mirabilis and Prevotella bivia. Started on linezolid(avoid nephrotoxicity of vancomycin) and pip-tazo. He continued to fever. ID engaged. MRI left ankle showed abscess in hindfoot, calcaneal acute osteomyelitis, abscess in the proximal abductor hallucis. He underwent  debridement of left heel with bone Bx on 1/15. HE was transitioned to Augmentin and doxycyline to complete 6 weeks of antibiotics from OR 1/15 (EOT 11/26/21). 10/26/21: He reports slight tremors that started a couple weeks priro to admission. He denies fever, chills, N/V/D. Interval: Admitted to [facility] 1/261/27 for hyperkalemia and AKI on CKD IV. Started on lokelma.  12/02/21 Pt reprots wound vac is off. No new complaints.  Interim: on 3/30 left heel wound debrided by podiatry. 01/06/22 MR left ankle showed acute osteo of plantar medial aspect of calcaneus.  01/11/22: Pt reports he is still taking antibiotics. Instead of taking Augmentin and doxycyline twice a day, he was taking it once a day.  Interim: Seen by podiatry(Dr. Blenda Mounts on 69/14/23) and noted wound was healed. Follows /03/15/22: Reports he is doing well. Still taking antibiotics(twice a day). /  05/12/22: Antibiotics stopped at last visit(9weeks). Seen by Dr. Blenda Mounts DPM on 71/12 wound healing. MRI on 7/19 showed resolution of abscess.  Interim: Patient was hospitalized at [facility] for first toe osteomyelitis.  He had been placed on Augmentin by podiatry for couple weeks.  Admitted due to MRI findings of osteomyelitis and concern for need for IV antibiotics.  On admission patient was afebrile without leukocytosis.  MRI showed soft tissue ulceration along the dorsal medial aspect great toe associated osteomyelitis distal first phalanx, medial head of the first proximal phalanx.  He was initially  started on broad-spectrum antibiotics with bank mycin and cefepime in the.  ID engaged.  Ideally would have like to obtain bone biopsies off of antibiotics.  Bone biopsy obtained on 69/2018 grew MRSA, osteo seen on pathology.  Patient on doxycycline and Augmentin. Today 08/31/2022: Patient reports he is tolerating Doxy Augmentin.  Denies any missed doses.  Denies fevers and chills.  Review of Systems: Review of Systems  All other systems reviewed and are negative.   Past Medical History:  Diagnosis Date   Chronic kidney disease    stage 5   Diabetes mellitus without complication ([place])    Diabetic ulcer of foot associated with diabetes mellitus due to underlying condition, limited to breakdown of skin ([place])    left   GERD (gastroesophageal reflux disease)    Hyperlipidemia    Hypertension    Non compliance w medication regimen    Secondary hyperparathyroidism of renal origin [facility])    per nephrology lov dr Candiss Norse 05-22-2022   Stroke [facility]) 2016   no deficits    Social History   Tobacco Use   Smoking status: Former   Smokeless tobacco: Never  Vaping Use   Vaping Use: Never used  Substance Use Topics   Alcohol use: Not Currently   Drug use: Never    No family history on file.  Allergies  Allergen Reactions   Losartan Potassium Other (See Comments)    hyperkalemia   Sildenafil Other (See Comments)    Not effective. "Did not like the way it made me feel"    Health Maintenance  Topic Date Due   Medicare Annual Wellness (AWV)  Never done   OPHTHALMOLOGY EXAM  Never done   Zoster Vaccines- Shingrix (1 of 2) Never done   COVID-19 Vaccine (4 - 2023-24 season) 06/02/2022   HEMOGLOBIN A1C  01/10/2023   FOOT EXAM  07/12/2023   COLONOSCOPY (Pts 45-37yr Insurance coverage will need to be confirmed)  08/10/2025   DTaP/Tdap/Td (2 - Td or Tdap) 08/17/2025   Pneumonia Vaccine 71 Years old  Completed   INFLUENZA VACCINE  Completed   Hepatitis C Screening  Completed   HPV  VACCINES  Aged Out    Objective:  There were no vitals filed for this visit. There is no height or weight on file to calculate BMI.  Physical Exam Constitutional:      General: He is not in acute distress.    Appearance: He is normal weight. He is not toxic-appearing.  HENT:     Head: Normocephalic and atraumatic.     Right Ear: External ear normal.     Left Ear: External ear normal.     Nose: No congestion or rhinorrhea.     Mouth/Throat:     Mouth: Mucous membranes are moist.     Pharynx: Oropharynx is clear.  Eyes:     Extraocular Movements: Extraocular movements intact.     Conjunctiva/sclera: Conjunctivae  normal.     Pupils: Pupils are equal, round, and reactive to light.  Cardiovascular:     Rate and Rhythm: Normal rate and regular rhythm.     Heart sounds: No murmur heard.    No friction rub. No gallop.  Pulmonary:     Effort: Pulmonary effort is normal.     Breath sounds: Normal breath sounds.  Abdominal:     General: Abdomen is flat. Bowel sounds are normal.     Palpations: Abdomen is soft.  Musculoskeletal:        General: No swelling. Normal range of motion.     Cervical back: Normal range of motion and neck supple.  Skin:    General: Skin is warm and dry.  Neurological:     General: No focal deficit present.     Mental Status: He is oriented to person, place, and time.  Psychiatric:        Mood and Affect: Mood normal.     Lab Results Lab Results  Component Value Date   WBC 9.3 08/20/2022   HGB 11.3 (L) 08/20/2022   HCT 34.4 (L) 08/20/2022   MCV 86.4 08/20/2022   PLT 183 08/20/2022    Lab Results  Component Value Date   CREATININE 4.31 (H) 08/20/2022   BUN 50 (H) 08/20/2022   NA 138 08/20/2022   K 5.1 08/20/2022   CL 107 08/20/2022   CO2 22 08/20/2022    Lab Results  Component Value Date   ALT 37 08/16/2022   AST 33 08/16/2022   ALKPHOS 68 08/16/2022   BILITOT 0.5 08/16/2022    Lab Results  Component Value Date   CHOL 122  07/11/2022   HDL 30 (L) 07/11/2022   LDLCALC 56 07/11/2022   TRIG 219 (H) 07/11/2022   CHOLHDL 4.1 07/11/2022   No results found for: "LABRPR", "RPRTITER" No results found for: "HIV1RNAQUANT", "HIV1RNAVL", "CD4TABS"   A/P 71 year old male admitted with:   #Right first toe osteomyelitis with 11/18 bone biopsy cultures positive rare CoNS. #Concern for nonhealing ulcer #Diabetes mellitus(A1c 7.7) #Hx of left foot osteomyelitis treated with prolonged -Again no obvious signs of skin soft tissue infection.  Although there is some areas on his toes that are callused over.  He is tolerating doxycycline and Augmentin.  His bone biopsy did not show osteomyelitis blood cultures grew rare MR CONS(likely contaminant).  Will plan on 6 weeks of doxycycline and Augmentin EOT 12/28.  Follow-up with infectious disease 10/05/2022 to get some labs off antibiotics. Antibiotics: Doxycycline and Augmentin 11/16 - 11/28 Continue to follow with podiatry      Laurice Record, [place] [facility] 08/31/2022, 1:55 PM

## 2022-09-01 LAB — COMPREHENSIVE METABOLIC PANEL
AG Ratio: 1 (calc) (ref 1.0–2.5)
ALT: 14 U/L (ref 9–46)
AST: 13 U/L (ref 10–35)
Albumin: 3.5 g/dL — ABNORMAL LOW (ref 3.6–5.1)
Alkaline phosphatase (APISO): 88 U/L (ref 35–144)
BUN/Creatinine Ratio: 12 (calc) (ref 6–22)
BUN: 55 mg/dL — ABNORMAL HIGH (ref 7–25)
CO2: 20 mmol/L (ref 20–32)
Calcium: 8.7 mg/dL (ref 8.6–10.3)
Chloride: 108 mmol/L (ref 98–110)
Creat: 4.45 mg/dL — ABNORMAL HIGH (ref 0.70–1.35)
Globulin: 3.6 g/dL (calc) (ref 1.9–3.7)
Glucose, Bld: 290 mg/dL — ABNORMAL HIGH (ref 65–99)
Potassium: 4.7 mmol/L (ref 3.5–5.3)
Sodium: 140 mmol/L (ref 135–146)
Total Bilirubin: 0.3 mg/dL (ref 0.2–1.2)
Total Protein: 7.1 g/dL (ref 6.1–8.1)

## 2022-09-01 LAB — CBC WITH DIFFERENTIAL/PLATELET
Absolute Monocytes: 715 cells/uL (ref 200–950)
Basophils Absolute: 44 cells/uL (ref 0–200)
Basophils Relative: 0.4 %
Eosinophils Absolute: 88 cells/uL (ref 15–500)
Eosinophils Relative: 0.8 %
HCT: 37.9 % — ABNORMAL LOW (ref 38.5–50.0)
Hemoglobin: 12.3 g/dL — ABNORMAL LOW (ref 13.2–17.1)
Lymphs Abs: 2167 cells/uL (ref 850–3900)
MCH: 28.5 pg (ref 27.0–33.0)
MCHC: 32.5 g/dL (ref 32.0–36.0)
MCV: 87.7 fL (ref 80.0–100.0)
MPV: 10.5 fL (ref 7.5–12.5)
Monocytes Relative: 6.5 %
Neutro Abs: 7986 cells/uL — ABNORMAL HIGH (ref 1500–7800)
Neutrophils Relative %: 72.6 %
Platelets: 213 10*3/uL (ref 140–400)
RBC: 4.32 10*6/uL (ref 4.20–5.80)
RDW: 13.7 % (ref 11.0–15.0)
Total Lymphocyte: 19.7 %
WBC: 11 10*3/uL — ABNORMAL HIGH (ref 3.8–10.8)

## 2022-09-01 LAB — C-REACTIVE PROTEIN: CRP: 7.4 mg/L (ref <8.0)

## 2022-09-01 LAB — SEDIMENTATION RATE: Sed Rate: 46 mm/h — ABNORMAL HIGH (ref 0–20)

## 2022-09-03 ENCOUNTER — Encounter (INDEPENDENT_AMBULATORY_CARE_PROVIDER_SITE_OTHER): Payer: Self-pay | Admitting: Nurse Practitioner

## 2022-09-03 NOTE — Progress Notes (Signed)
Subjective:    Patient ID: [name], male    DOB: [birth year], 71 y.o.   MRN: [id number] Chief Complaint  Patient presents with   New Patient (Initial Visit)    Ref Candiss Norse consult vein mapp, AVF evaluation     The patient is seen for evaluation for dialysis access. The patient has chronic renal insufficiency stage V secondary to hypertension. The patient's most recent creatinine clearance is less than 20. The patient volume status has not yet become an issue. Patient's blood pressures been relatively well controlled. There are mild uremic symptoms which appear to be relatively well tolerated at this time.  The patient notes the kidney problem has been present for a long time and has been progressively getting worse.  The patient is followed by nephrology.    The patient is right-handed.  The patient has been considering the various methods of dialysis and wishes to proceed with hemodialysis and therefore creation of AV access.  No recent shortening of the patient's walking distance or new symptoms consistent with claudication.  No history of rest pain symptoms. No new ulcers or wounds of the lower extremities have occurred.  The patient denies amaurosis fugax or recent TIA symptoms. There are no recent neurological changes noted. There is no history of DVT, PE or superficial thrombophlebitis. No recent episodes of angina or shortness of breath documented.   Patient has adequate access for a left brachial axillary AV graft insertion.    Review of Systems  All other systems reviewed and are negative.      Objective:   Physical Exam Vitals reviewed.  Cardiovascular:     Rate and Rhythm: Normal rate.     Pulses:          Radial pulses are 2+ on the right side and 2+ on the left side.  Pulmonary:     Effort: Pulmonary effort is normal.  Skin:    General: Skin is warm and dry.  Neurological:     Mental Status: He is alert and oriented to person, place, and time.   Psychiatric:        Mood and Affect: Mood normal.        Behavior: Behavior normal.        Thought Content: Thought content normal.        Judgment: Judgment normal.     BP (!) 155/75 (BP Location: Right Arm)   Pulse 81   Resp 16   Wt 194 lb 6.4 oz (88.2 kg)   BMI 26.37 kg/m   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 due to underlying condition, limited to breakdown of skin (HCC)    left   GERD (gastroesophageal reflux disease)    Hyperlipidemia    Hypertension    Non compliance w medication regimen    Secondary hyperparathyroidism of renal origin [facility])    per nephrology lov dr Candiss Norse 05-22-2022   Stroke [facility]) 2016   no deficits    Social History   Socioeconomic History   Marital status: Married    Spouse name: Not on file   Number of children: Not on file   Years of education: Not on file   Highest education level: Not on file  Occupational History   Not on file  Tobacco Use   Smoking status: Former   Smokeless tobacco:  Never  Vaping Use   Vaping Use: Never used  Substance and Sexual Activity   Alcohol use: Not Currently   Drug use: Never   Sexual activity: Not on file  Other Topics Concern   Not on file  Social History Narrative   Not on file   Social Determinants of Health   Financial Resource Strain: Not on file  Food Insecurity: Not on file  Transportation Needs: No Transportation Needs (08/17/2022)   PRAPARE - Hydrologist (Medical): No    Lack of Transportation (Non-Medical): No  Physical Activity: Not on file  Stress: Not on file  Social Connections: Not on file  Intimate Partner Violence: Not on file    Past Surgical History:  Procedure Laterality Date   AMPUTATION OF REPLICATED TOES Left 4128   left big toe, secont toe   APPENDECTOMY     BIOPSY  08/10/2020    Procedure: BIOPSY;  Surgeon: Harvel Quale, MD;  [facility];  Service: Gastroenterology;;   CHOLECYSTECTOMY     COLON SURGERY     COLONOSCOPY WITH PROPOFOL N/A 08/10/2020   Procedure: COLONOSCOPY WITH PROPOFOL;  Surgeon: Harvel Quale, MD;  [facility];  Service: Gastroenterology;  Laterality: N/A;  786   GRAFT APPLICATION Left 04/06/7208   Procedure: GRAFT APPLICATION;  Surgeon: [name], DPM;  Location: [place];  Service: Podiatry;  Laterality: Left;   HERNIA REPAIR     I & D EXTREMITY Left 10/10/2021   Procedure: IRRIGATION AND DEBRIDEMENT left foot;  Surgeon: Evelina Bucy, DPM;  Location: [place];  Service: Podiatry;  Laterality: Left;   I & D EXTREMITY Right 08/19/2022   Procedure: IRRIGATION AND DEBRIDEMENT RIGHT GREAT TOE GRAFT APPLICATION WITH BIOPSY;  Surgeon: Criselda Peaches, DPM;  Location: [place];  Service: Podiatry;  Laterality: Right;   IRRIGATION AND DEBRIDEMENT FOOT Left 10/16/2021   Procedure: IRRIGATION AND DEBRIDEMENT FOOT;  Surgeon: [name], DPM;  Location: [place];  Service: Podiatry;  Laterality: Left;  Abscess left heel   METATARSAL HEAD EXCISION Left 12/08/2019   Procedure: METATARSAL HEAD RECESSION THIRD LEFT;  Surgeon: [name], DPM;  Location: [place];  Service: Podiatry;  Laterality: Left;   POLYPECTOMY  08/10/2020   Procedure: POLYPECTOMY;  Surgeon: Harvel Quale, MD;  [facility];  Service: Gastroenterology;;   WOUND DEBRIDEMENT Left 12/08/2019   Procedure: DEBRIDEMENT WOUND;  Surgeon: [name], DPM;  Location: [place];  Service: Podiatry;  Laterality: Left;   WOUND DEBRIDEMENT Left 10/12/2021   Procedure: LEFT FOOT WOUND DEBRIDEMENT AND IRRIGATION, 1ST METATARSAL RESECTION;  Surgeon: Evelina Bucy, DPM;  Location: [place];  Service: Podiatry;  Laterality: Left;    No family history on file.  Allergies  Allergen  Reactions   Losartan Potassium Other (See Comments)    hyperkalemia   Sildenafil Other (See Comments)    Not effective. "Did not like the way it made me feel"       Latest Ref Rng & Units 08/31/2022    2:19 PM 08/20/2022    6:50 AM 08/19/2022    4:01 AM  CBC  WBC 3.8 - 10.8 Thousand/uL 11.0  9.3  7.9   Hemoglobin 13.2 - 17.1 g/dL 12.3  11.3  11.0   Hematocrit 38.5 - 50.0 % 37.9  34.4  33.6   Platelets 140 - 400 Thousand/uL 213  183  186  CMP     Component Value Date/Time   NA 140 08/31/2022 1419   K 4.7 08/31/2022 1419   CL 108 08/31/2022 1419   CO2 20 08/31/2022 1419   GLUCOSE 290 (H) 08/31/2022 1419   BUN 55 (H) 08/31/2022 1419   CREATININE 4.45 (H) 08/31/2022 1419   CALCIUM 8.7 08/31/2022 1419   PROT 7.1 08/31/2022 1419   ALBUMIN 3.3 (L) 08/16/2022 2146   AST 13 08/31/2022 1419   ALT 14 08/31/2022 1419   ALKPHOS 68 08/16/2022 2146   BILITOT 0.3 08/31/2022 1419   GFRNONAA 14 (L) 08/20/2022 0650     VAS [place] ABI WITH/WO TBI  Result Date: 08/29/2022  LOWER EXTREMITY DOPPLER STUDY Patient Name:  KIVON APREA  Date of Exam:   08/29/2022 Medical Rec #: [id number]      Accession #:    [id number] Date of Birth: 02/22/1951      Patient Gender: M Patient Age:   27 years Exam Location:  [facility] Procedure:      VAS [place] ABI WITH/WO TBI Referring Phys: Sheppard Coil STAND[place] --------------------------------------------------------------------------------  Indications: Ulceration. left transmetatarsal amputation High Risk Factors: Hypertension, hyperlipidemia, Diabetes, past history of                    smoking, prior CVA. Other Factors: CKD stage 5, Diabetic neuropathy.  Limitations: Today's exam was limited due to bandages. Comparison Study: Prior ABI 10/10/21 Performing Technologist: Elta Guadeloupe RVT, RDMS  Examination Guidelines: A complete evaluation includes at minimum, Doppler waveform signals and systolic blood pressure reading at the level of bilateral  brachial, anterior tibial, and posterior tibial arteries, when vessel segments are accessible. Bilateral testing is considered an integral part of a complete examination. Photoelectric Plethysmograph (PPG) waveforms and toe systolic pressure readings are included as required and additional duplex testing as needed. Limited examinations for reoccurring indications may be performed as noted.  ABI Findings: +---------+------------------+-----+-----------+----------------------+ Right    Rt Pressure (mmHg)IndexWaveform   Comment                +---------+------------------+-----+-----------+----------------------+ Brachial 171                                                      +---------+------------------+-----+-----------+----------------------+ PTA      177               1.02 multiphasic                       +---------+------------------+-----+-----------+----------------------+ DP       178               1.03 multiphasic                       +---------+------------------+-----+-----------+----------------------+ Great Toe                       Abnormal   Recent toe debridement +---------+------------------+-----+-----------+----------------------+ +---------+------------------+-----+-----------+-------------------------------+ Left     Lt Pressure (mmHg)IndexWaveform   Comment                         +---------+------------------+-----+-----------+-------------------------------+ Brachial 173                                                               +---------+------------------+-----+-----------+-------------------------------+  PTA      174               1.01 multiphasic                                +---------+------------------+-----+-----------+-------------------------------+ DP       159               0.92 multiphasic                                +---------+------------------+-----+-----------+-------------------------------+ Great Toe                        Abnormal   LT great toe and lesser toe                                                amputated.                      +---------+------------------+-----+-----------+-------------------------------+ +-------+-----------+-----------+------------+------------+ ABI/TBIToday's ABIToday's TBIPrevious ABIPrevious TBI +-------+-----------+-----------+------------+------------+ Right  1.03       bandage    1.03        -            +-------+-----------+-----------+------------+------------+ Left   1.01       amputated  1.01        amputated    +-------+-----------+-----------+------------+------------+  Arterial wall calcification precludes accurate ankle pressures and ABIs. PPG tracings display appropriate pulsatility. Bilateral ABIs and TBIs appear essentially unchanged.  Summary: Right: Resting right ankle-brachial index is within normal range. Left: Resting left ankle-brachial index is within normal range. *See table(s) above for measurements and observations.  Electronically signed by Monica Martinez MD on 08/29/2022 at 4:56:27 PM.    Final        Assessment & Plan:   1. CKD (chronic kidney disease) stage 5, GFR less than 15 ml/min (HCC) Recommend:  At this time the patient does not have appropriate extremity access for dialysis  Patient should have a Left brachial axillary graft created.  The risks, benefits and alternative therapies were reviewed in detail with the patient.  All questions were answered.  The patient agrees to proceed with surgery.   The patient will follow up with me in the office after the surgery.  2. Type 2 diabetes mellitus with peripheral neuropathy ([place]) Continue hypoglycemic medications as already ordered, these medications have been reviewed and there are no changes at this time.  Hgb A1C to be monitored as already arranged by primary service    3. Hyperlipidemia, mixed Continue statin as ordered and reviewed, no changes at  this time   Current Outpatient Medications on File Prior to Visit  Medication Sig Dispense Refill   amLODipine (NORVASC) 10 MG tablet Take 10 mg by mouth daily.     aspirin EC 81 MG tablet Take 81 mg by mouth daily. Swallow whole.     atorvastatin (LIPITOR) 80 MG tablet Take 80 mg by mouth daily.     calcitRIOL (ROCALTROL) 0.25 MCG capsule Take 0.25 mcg by mouth daily.     docusate sodium (COLACE) 100 MG capsule Take 100 mg by mouth 2 (two) times daily.     Dulaglutide 4.5 MG/0.5ML SOPN Inject  4.5 mg into the skin every Sunday.     NOVOLOG MIX 70/30 FLEXPEN (70-30) 100 UNIT/ML FlexPen Inject 32-34 Units into the skin 2 (two) times daily with a meal.     No current facility-administered medications on file prior to visit.    There are no Patient Instructions on file for this visit. No follow-ups on file.   Kris Hartmann, NP

## 2022-09-04 ENCOUNTER — Telehealth: Payer: Self-pay | Admitting: Internal Medicine

## 2022-09-04 ENCOUNTER — Telehealth: Payer: Self-pay

## 2022-09-04 MED ORDER — AMOXICILLIN-POT CLAVULANATE 500-125 MG PO TABS: 1.0000 | ORAL_TABLET | Freq: Two times a day (BID) | ORAL | 0 refills | Status: DC

## 2022-09-04 MED ORDER — DOXYCYCLINE HYCLATE 100 MG PO TABS: 100.0000 mg | ORAL_TABLET | Freq: Two times a day (BID) | ORAL | 0 refills | Status: DC

## 2022-09-04 NOTE — Telephone Encounter (Signed)
Rs augmentin 500/125 bid,doxy '100mg'$  bid

## 2022-09-04 NOTE — Telephone Encounter (Signed)
-----   Message from Laurice Record, MD sent at 09/01/2022  2:19 PM EST ----- Overall stable labs. Wbc is slightly elevated which could be reactive. If pt develops fevers/chills call clinic or got to ED.

## 2022-09-04 NOTE — Telephone Encounter (Signed)
Patient aware of results.   [place], CMA

## 2022-09-07 DIAGNOSIS — N185 Chronic kidney disease, stage 5: Secondary | ICD-10-CM | POA: Diagnosis not present

## 2022-09-07 DIAGNOSIS — N184 Chronic kidney disease, stage 4 (severe): Secondary | ICD-10-CM | POA: Diagnosis not present

## 2022-09-07 DIAGNOSIS — N2581 Secondary hyperparathyroidism of renal origin: Secondary | ICD-10-CM | POA: Diagnosis not present

## 2022-09-07 DIAGNOSIS — D631 Anemia in chronic kidney disease: Secondary | ICD-10-CM | POA: Diagnosis not present

## 2022-09-07 DIAGNOSIS — I10 Essential (primary) hypertension: Secondary | ICD-10-CM | POA: Diagnosis not present

## 2022-09-07 DIAGNOSIS — R809 Proteinuria, unspecified: Secondary | ICD-10-CM | POA: Diagnosis not present

## 2022-09-07 DIAGNOSIS — E1122 Type 2 diabetes mellitus with diabetic chronic kidney disease: Secondary | ICD-10-CM | POA: Diagnosis not present

## 2022-09-07 DIAGNOSIS — I129 Hypertensive chronic kidney disease with stage 1 through stage 4 chronic kidney disease, or unspecified chronic kidney disease: Secondary | ICD-10-CM | POA: Diagnosis not present

## 2022-09-11 DIAGNOSIS — N2581 Secondary hyperparathyroidism of renal origin: Secondary | ICD-10-CM | POA: Diagnosis not present

## 2022-09-11 DIAGNOSIS — N185 Chronic kidney disease, stage 5: Secondary | ICD-10-CM | POA: Diagnosis not present

## 2022-09-11 DIAGNOSIS — R809 Proteinuria, unspecified: Secondary | ICD-10-CM | POA: Diagnosis not present

## 2022-09-11 DIAGNOSIS — I10 Essential (primary) hypertension: Secondary | ICD-10-CM | POA: Diagnosis not present

## 2022-09-11 DIAGNOSIS — E1122 Type 2 diabetes mellitus with diabetic chronic kidney disease: Secondary | ICD-10-CM | POA: Diagnosis not present

## 2022-09-13 ENCOUNTER — Inpatient Hospital Stay
Admission: RE | Admit: 2022-09-13 | Discharge: 2022-09-13 | Disposition: A | Discharge status: Home / Self Care | Payer: No Typology Code available for payment source | Source: Ambulatory Visit

## 2022-09-13 ENCOUNTER — Other Ambulatory Visit (INDEPENDENT_AMBULATORY_CARE_PROVIDER_SITE_OTHER): Payer: Self-pay | Admitting: Nurse Practitioner

## 2022-09-13 DIAGNOSIS — N186 End stage renal disease: Secondary | ICD-10-CM

## 2022-09-13 HISTORY — DX: Anemia, unspecified: D64.9

## 2022-09-13 HISTORY — DX: Type 2 diabetes mellitus with diabetic neuropathy, unspecified: E11.40

## 2022-09-14 ENCOUNTER — Encounter: Payer: Self-pay | Admitting: Urgent Care

## 2022-09-14 ENCOUNTER — Ambulatory Visit (INDEPENDENT_AMBULATORY_CARE_PROVIDER_SITE_OTHER): Payer: No Typology Code available for payment source | Admitting: Podiatry

## 2022-09-14 DIAGNOSIS — L97512 Non-pressure chronic ulcer of other part of right foot with fat layer exposed: Secondary | ICD-10-CM

## 2022-09-14 DIAGNOSIS — E1142 Type 2 diabetes mellitus with diabetic polyneuropathy: Secondary | ICD-10-CM

## 2022-09-14 MED ORDER — GENTAMICIN SULFATE 0.1 % EX OINT: 1.0000 | TOPICAL_OINTMENT | Freq: Every day | CUTANEOUS | 0 refills | Status: DC

## 2022-09-14 NOTE — Progress Notes (Signed)
  Subjective:  Patient ID: [name], male    DOB: 09/06/1951,  MRN: [id number]  Chief Complaint  Patient presents with   foot care    [name], wound on right great hallux that has sutures for removal     71 y.o. male returns for post-op check for right hallux wound debridment and graft application .  Overall is doing well is not having much pain. States he has been wrapping the toe with gauze and applying an antibiotic that is clear he is not sure of the name. DOS 08/19/22 I&D R hallux with graft application and bone biopsy.   Review of Systems: Negative except as noted in the HPI. Denies N/V/F/Ch.   Objective:  There were no vitals filed for this visit. There is no height or weight on file to calculate BMI. Constitutional Well developed. Well nourished.  Vascular Foot warm and well perfused. Capillary refill normal to all digits.  Calf is soft and supple, no posterior calf or knee pain, negative Homans' sign  Neurologic Normal speech. Oriented to person, place, and time. Epicritic sensation to light touch grossly reduced bilaterally.  Dermatologic Graft was severely dessicated at this visit. It was removed and underyling there are two granular ulcerations present a tthe medial aspect of the right hallux to subcutaneous fat tissue level. Wounds appear overall healthy no purulence, no erythema or edema of the right hallux.      Orthopedic: He has little to no tenderness to palpation noted about the surgical site.   Multiple view plain film radiographs: Interval biopsy resection of distal tuft phalanx  Pathology biopsies were negative, one of the bone cultures was positive for Staphylococcus Assessment:   1. Ulcer of right foot with fat layer exposed ([place])     Plan:  Patient was evaluated and treated and all questions answered.  S/p foot surgery right hallux bone biopsy and graft application  -Progressing as expected post-operatively.  Graft was removed at this visit related  to severe dessication and necrosis with removal of all sutures present. Healthy tissue underlying with no evidence of deep infection at this time. Continue with local wound car.e  -WB Status: WBAT to heel in postop shoe -Sutures: Removed  -Medications: No refills required -Foot redressed with gauze and coban. Recommend daily dressing changes with gentamicin ointment, gauze coban.  - Referral to wound care center placed for onoging wound care.    Return in about 3 weeks (around 10/05/2022) for Follow up R hallux wound.

## 2022-09-15 ENCOUNTER — Ambulatory Visit
Admission: RE | Admit: 2022-09-15 | Payer: No Typology Code available for payment source | Source: Ambulatory Visit | Admitting: Vascular Surgery

## 2022-09-15 ENCOUNTER — Encounter: Admission: RE | Payer: Self-pay | Source: Ambulatory Visit

## 2022-09-15 SURGERY — INSERTION OF ARTERIOVENOUS (AV) GORE-TEX GRAFT ARM
Anesthesia: General | Laterality: Left

## 2022-09-21 ENCOUNTER — Other Ambulatory Visit: Payer: No Typology Code available for payment source

## 2022-09-21 DIAGNOSIS — H524 Presbyopia: Secondary | ICD-10-CM | POA: Diagnosis not present

## 2022-10-05 ENCOUNTER — Ambulatory Visit (INDEPENDENT_AMBULATORY_CARE_PROVIDER_SITE_OTHER): Payer: No Typology Code available for payment source | Admitting: Internal Medicine

## 2022-10-05 ENCOUNTER — Other Ambulatory Visit: Payer: Self-pay

## 2022-10-05 ENCOUNTER — Ambulatory Visit (INDEPENDENT_AMBULATORY_CARE_PROVIDER_SITE_OTHER): Payer: No Typology Code available for payment source | Admitting: Podiatry

## 2022-10-05 ENCOUNTER — Encounter: Payer: Self-pay | Admitting: Internal Medicine

## 2022-10-05 VITALS — BP 101/69 | HR 82 | Temp 97.7°F | Ht 72.0 in | Wt 185.0 lb

## 2022-10-05 DIAGNOSIS — M869 Osteomyelitis, unspecified: Secondary | ICD-10-CM

## 2022-10-05 DIAGNOSIS — E1142 Type 2 diabetes mellitus with diabetic polyneuropathy: Secondary | ICD-10-CM

## 2022-10-05 DIAGNOSIS — L97512 Non-pressure chronic ulcer of other part of right foot with fat layer exposed: Secondary | ICD-10-CM

## 2022-10-05 NOTE — Progress Notes (Signed)
  Subjective:  Patient ID: [name], male    DOB: [birth year],  MRN: [id number]  Chief Complaint  Patient presents with   Post-op Follow-up    POV # 2 dos 11.18.2023 IRRIGATION AND DEBRIDEMENT RIGHT GREAT TOE WOUND     72 y.o. male returns for post-op check for right hallux wound debridment and graft application . DOS 08/19/22 I&D R hallux with graft application and bone biopsy.  Patient denies any issues has been doing daily dressing changes putting antibiotic ointment on the area.  saw infectious disease who said no further antibiotics are indicated no concern for infection on their behalf.  He is set to see the wound care center next week on January 11.  Review of Systems: Negative except as noted in the HPI. Denies N/V/F/Ch.   Objective:  There were no vitals filed for this visit. There is no height or weight on file to calculate BMI. Constitutional Well developed. Well nourished.  Vascular Foot warm and well perfused. Capillary refill normal to all digits.  Calf is soft and supple, no posterior calf or knee pain, negative Homans' sign  Neurologic Normal speech. Oriented to person, place, and time. Epicritic sensation to light touch grossly reduced bilaterally.  Dermatologic Ulceration present to the dorsal medial aspect of the right hallux IPJ. No purulence. Bleeds well with debridement. Decreased size and decreased macerated/hyperkeratotic tissue from prior.         Orthopedic: He has little to no tenderness to palpation noted about the surgical site.   Multiple view plain film radiographs: Interval biopsy resection of distal tuft phalanx  Pathology biopsies were negative, one of the bone cultures was positive for Staphylococcus Assessment:   1. Ulcer of right foot with fat layer exposed ([place])   2. Type 2 diabetes mellitus with peripheral neuropathy (HCC)      Plan:  Patient was evaluated and treated and all questions answered.  S/p foot surgery right hallux  bone biopsy and graft application  -Progressing as expected post-operatively.  Overall the ulceration looks smaller at this visit improved from prior. -Debridement as below.  No sign of infection presently. -WB Status: WBAT to heel in postop shoe -Foot redressed with Betadine ointment and Band-Aid. Recommend daily dressing changes with Betadine gauze coban.  -Patient is aware to follow-up with wound care center next week generally 11.  Procedure: Excisional Debridement of Wound right hallux to level subcutaneous fat tissue Rationale: Removal of non-viable soft tissue from the wound to promote healing.  Anesthesia: none Post-Debridement Wound Measurements: 0.4 cm x 0.5 cm x 0.3 cm  Type of Debridement: Sharp Excisional Tissue Removed: Non-viable soft tissue Depth of Debridement: subcutaneous tissue. Technique: Sharp excisional debridement to bleeding, viable wound base.  Dressing: Dry, sterile, compression dressing. Disposition: Patient tolerated procedure well.   Return in about 4 weeks (around 11/02/2022) for Follow-up right hallux wound.

## 2022-10-05 NOTE — Progress Notes (Signed)
Patient: [name]  DOB: 02-27-1951 MRN: [id number] PCP: Coral Spikes, DO    Chief Complaint  Patient presents with   Follow-up     Patient Active Problem List   Diagnosis Date Noted   Ulcer of toe of right foot, with necrosis of bone ([place]) 08/18/2022   CKD (chronic kidney disease) stage 5, GFR less than 15 ml/min ([place]) 07/12/2022   Former smoker 07/12/2022   History of stroke 07/12/2022   Osteomyelitis of great toe of right foot ([place]) 07/12/2022   Diabetic neuropathy ([place]) 10/28/2021   Anemia in chronic kidney disease 05/27/2020   History of amputation of lesser toe of left foot ([place]) 08/18/2015   Hyperlipidemia, mixed 08/18/2015   Type 2 diabetes mellitus with peripheral neuropathy ([place]) 08/18/2015   HTN (hypertension) 04/17/2013     Subjective:  [name] is a 72 y.o. M with PMHx as below(including A1c 8.3), CKD stage IV presents for hospital follow-up of left foot osteomyelitis. He was admitted 1/9-1/19/23 to [facility] with worsening diabetic foot wound. He started on bactrim prior to admission. Taken to OR on 1/9 for I7D and 1st metatarsal resection on 1/11 OR Cx + strep anginosis, proteus mirabilis and Prevotella bivia. Started on linezolid(avoid nephrotoxicity of vancomycin) and pip-tazo. He continued to fever. ID engaged. MRI left ankle showed abscess in hindfoot, calcaneal acute osteomyelitis, abscess in the proximal abductor hallucis. He underwent  debridement of left heel with bone Bx on 1/15. HE was transitioned to Augmentin and doxycyline to complete 6 weeks of antibiotics from OR 1/15 (EOT 11/26/21). 10/26/21: He reports slight tremors that started a couple weeks priro to admission. He denies fever, chills, N/V/D. Interval: Admitted to [facility] 1/261/27 for hyperkalemia and AKI on CKD IV. Started on lokelma.  12/02/21 Pt reprots wound vac is off. No new complaints.  Interim: on 3/30 left heel wound debrided by podiatry. 01/06/22 MR left ankle showed acute osteo of  plantar medial aspect of calcaneus.  01/11/22: Pt reports he is still taking antibiotics. Instead of taking Augmentin and doxycyline twice a day, he was taking it once a day.  Interim: Seen by podiatry(Dr. Blenda Mounts on 70/14/23) and noted wound was healed. Follows /03/15/22: Reports he is doing well. Still taking antibiotics(twice a day). /  05/12/22: Antibiotics stopped at last visit(9weeks). Seen by Dr. Blenda Mounts DPM on 71/12 wound healing. MRI on 7/19 showed resolution of abscess.  Interim: Patient was hospitalized at [facility] for first toe osteomyelitis.  He had been placed on Augmentin by podiatry for couple weeks.  Admitted due to MRI findings of osteomyelitis and concern for need for IV antibiotics.  On admission patient was afebrile without leukocytosis.  MRI showed soft tissue ulceration along the dorsal medial aspect great toe associated osteomyelitis distal first phalanx, medial head of the first proximal phalanx.  He was initially started on broad-spectrum antibiotics with bank mycin and cefepime in the.  ID engaged.  Ideally would have like to obtain bone biopsies off of antibiotics.  Bone biopsy obtained on 08/2017 grew MRSA, osteo seen on pathology.  Patient on doxycycline and Augmentin.  08/31/2022: Patient reports he is tolerating Doxy Augmentin.  Denies any missed doses.  Denies fevers and chills. Today 10/05/2022 patient is doing well.  He completed his antibiotics. Review of Systems  All other systems reviewed and are negative.   Past Medical History:  Diagnosis Date   Anemia    Chronic kidney disease    stage 5   Diabetes  mellitus without complication ([place])    Diabetic neuropathy ([place])    Diabetic ulcer of foot associated with diabetes mellitus due to underlying condition, limited to breakdown of skin ([place])    left   GERD (gastroesophageal reflux disease)    Hyperlipidemia    Hypertension    Non compliance w medication regimen    Osteomyelitis ([place]) 08/2022   great toe of right  foot   Secondary hyperparathyroidism of renal origin [facility])    per nephrology lov dr Candiss Norse 05-22-2022   Stroke [facility]) 2016   no deficits    Outpatient Medications Prior to Visit  Medication Sig Dispense Refill   amLODipine (NORVASC) 10 MG tablet Take 10 mg by mouth daily.     aspirin EC 81 MG tablet Take 81 mg by mouth daily. Swallow whole.     atorvastatin (LIPITOR) 80 MG tablet Take 80 mg by mouth daily.     calcitRIOL (ROCALTROL) 0.25 MCG capsule Take 0.25 mcg by mouth daily.     Continuous Blood Gluc Sensor (FREESTYLE LIBRE 2 SENSOR) MISC USE TO CHECK BLOOD GLUCOSE CONTINUOUSLY. CHANGE SENSOR EVERY 14 DAYS     docusate sodium (COLACE) 100 MG capsule Take 100 mg by mouth 2 (two) times daily.     doxycycline (VIBRA-TABS) 100 MG tablet Take 1 tablet (100 mg total) by mouth 2 (two) times daily with a meal. 60 tablet 0   Dulaglutide 4.5 MG/0.5ML SOPN Inject 4.5 mg into the skin every Sunday.     gentamicin ointment (GARAMYCIN) 0.1 % Apply 1 Application topically daily. Apply to right hallux daily with dressing change 15 g 0   NOVOLOG MIX 70/30 FLEXPEN (70-30) 100 UNIT/ML FlexPen Inject 32-34 Units into the skin 2 (two) times daily with a meal.     traMADol (ULTRAM) 50 MG tablet Take 1 tablet (50 mg total) by mouth every 8 (eight) hours as needed for severe pain or moderate pain. 28 tablet 0   amoxicillin-clavulanate (AUGMENTIN) 500-125 MG tablet Take 1 tablet by mouth in the morning and at bedtime. (Patient not taking: Reported on 10/05/2022) 60 tablet 0   No facility-administered medications prior to visit.     Allergies  Allergen Reactions   Losartan Potassium Other (See Comments)    hyperkalemia   Sildenafil Other (See Comments)    Not effective. "Did not like the way it made me feel"    Social History   Tobacco Use   Smoking status: Former   Smokeless tobacco: Never  Vaping Use   Vaping Use: Never used  Substance Use Topics   Alcohol use: Not Currently   Drug use: Never     No family history on file.  Objective:   Vitals:   10/05/22 0912  Weight: 185 lb (83.9 kg)  Height: 6' (1.829 m)   Body mass index is 25.09 kg/m.  Physical Exam Constitutional:      General: He is not in acute distress.    Appearance: He is normal weight. He is not toxic-appearing.  HENT:     Head: Normocephalic and atraumatic.     Right Ear: External ear normal.     Left Ear: External ear normal.     Nose: No congestion or rhinorrhea.     Mouth/Throat:     Mouth: Mucous membranes are moist.     Pharynx: Oropharynx is clear.  Eyes:     Extraocular Movements: Extraocular movements intact.     Conjunctiva/sclera: Conjunctivae normal.     Pupils: Pupils are  equal, round, and reactive to light.  Cardiovascular:     Rate and Rhythm: Normal rate and regular rhythm.     Heart sounds: No murmur heard.    No friction rub. No gallop.  Pulmonary:     Effort: Pulmonary effort is normal.     Breath sounds: Normal breath sounds.  Abdominal:     General: Abdomen is flat. Bowel sounds are normal.     Palpations: Abdomen is soft.  Musculoskeletal:        General: No swelling. Normal range of motion.     Cervical back: Normal range of motion and neck supple.  Skin:    General: Skin is warm and dry.  Neurological:     General: No focal deficit present.     Mental Status: He is oriented to person, place, and time.  Psychiatric:        Mood and Affect: Mood normal.     Lab Results: Lab Results  Component Value Date   WBC 11.0 (H) 08/31/2022   HGB 12.3 (L) 08/31/2022   HCT 37.9 (L) 08/31/2022   MCV 87.7 08/31/2022   PLT 213 08/31/2022    Lab Results  Component Value Date   CREATININE 4.45 (H) 08/31/2022   BUN 55 (H) 08/31/2022   NA 140 08/31/2022   K 4.7 08/31/2022   CL 108 08/31/2022   CO2 20 08/31/2022    Lab Results  Component Value Date   ALT 14 08/31/2022   AST 13 08/31/2022   ALKPHOS 68 08/16/2022   BILITOT 0.3 08/31/2022     Assessment & Plan:   A/P 72 year old male admitted with:   #Right first toe osteomyelitis with 11/18 bone biopsy cultures positive rare CoNS. #Concern for nonhealing ulcer #Diabetes mellitus(A1c 7.7) #Hx of left foot osteomyelitis treated with prolonged -Again no obvious signs of skin soft tissue infection.  Although there is some areas on his toes that are callused over.  He is tolerating doxycycline and Augmentin.  His bone biopsy did not show osteomyelitis blood cultures grew rare MR CONS(likely contaminant).  Will plan on 6 weeks of doxycycline and Augmentin EOT 12/28.  Follow-up with infectious disease 10/05/2022 to get some labs off antibiotics. -Seen by podiatry on 12/14, graft was removed related to severe desiccation necrosis.  Healthy tissue underlying with no evidence of deep tissue infection.  Referred to wound care. Antibiotics:  Doxycycline and Augmentin 11/16 - 12/28 Plan - Will get labs off of antibiotics - Follow-up in 3 months - Continue follow-up podiatry and wound care  I have personally spent 34 minutes involved in face-to-face and non-face-to-face activities for this patient on the day of the visit. Professional time spent includes the following activities: Preparing to see the patient (review of tests), Obtaining and/or reviewing separately obtained history (admission/discharge record), Performing a medically appropriate examination and/or evaluation , Ordering medications/tests/procedures, referring and communicating with other health care professionals, Documenting clinical information in the EMR, Independently interpreting results (not separately reported), Communicating results to the patient/family/caregiver, Counseling and educating the patient/family/caregiver and Care coordination (not separately reported).    Laurice Record, MD [facility]   10/05/22  9:13 AM

## 2022-10-06 LAB — CBC WITH DIFFERENTIAL/PLATELET
Absolute Monocytes: 705 cells/uL (ref 200–950)
Basophils Absolute: 43 cells/uL (ref 0–200)
Basophils Relative: 0.5 %
Eosinophils Absolute: 181 cells/uL (ref 15–500)
Eosinophils Relative: 2.1 %
HCT: 38.8 % (ref 38.5–50.0)
Hemoglobin: 12.7 g/dL — ABNORMAL LOW (ref 13.2–17.1)
Lymphs Abs: 2279 cells/uL (ref 850–3900)
MCH: 28.2 pg (ref 27.0–33.0)
MCHC: 32.7 g/dL (ref 32.0–36.0)
MCV: 86.2 fL (ref 80.0–100.0)
MPV: 10.6 fL (ref 7.5–12.5)
Monocytes Relative: 8.2 %
Neutro Abs: 5392 cells/uL (ref 1500–7800)
Neutrophils Relative %: 62.7 %
Platelets: 203 10*3/uL (ref 140–400)
RBC: 4.5 10*6/uL (ref 4.20–5.80)
RDW: 15.2 % — ABNORMAL HIGH (ref 11.0–15.0)
Total Lymphocyte: 26.5 %
WBC: 8.6 10*3/uL (ref 3.8–10.8)

## 2022-10-06 LAB — COMPREHENSIVE METABOLIC PANEL
AG Ratio: 0.9 (calc) — ABNORMAL LOW (ref 1.0–2.5)
ALT: 90 U/L — ABNORMAL HIGH (ref 9–46)
AST: 32 U/L (ref 10–35)
Albumin: 3.5 g/dL — ABNORMAL LOW (ref 3.6–5.1)
Alkaline phosphatase (APISO): 98 U/L (ref 35–144)
BUN/Creatinine Ratio: 12 (calc) (ref 6–22)
BUN: 51 mg/dL — ABNORMAL HIGH (ref 7–25)
CO2: 23 mmol/L (ref 20–32)
Calcium: 9.4 mg/dL (ref 8.6–10.3)
Chloride: 108 mmol/L (ref 98–110)
Creat: 4.39 mg/dL — ABNORMAL HIGH (ref 0.70–1.35)
Globulin: 3.7 g/dL (calc) (ref 1.9–3.7)
Glucose, Bld: 157 mg/dL — ABNORMAL HIGH (ref 65–99)
Potassium: 4.9 mmol/L (ref 3.5–5.3)
Sodium: 140 mmol/L (ref 135–146)
Total Bilirubin: 0.3 mg/dL (ref 0.2–1.2)
Total Protein: 7.2 g/dL (ref 6.1–8.1)

## 2022-10-06 LAB — C-REACTIVE PROTEIN: CRP: 3.3 mg/L (ref <8.0)

## 2022-10-06 LAB — SEDIMENTATION RATE: Sed Rate: 33 mm/h — ABNORMAL HIGH (ref 0–20)

## 2022-10-12 ENCOUNTER — Encounter (HOSPITAL_BASED_OUTPATIENT_CLINIC_OR_DEPARTMENT_OTHER): Payer: No Typology Code available for payment source | Attending: General Surgery | Admitting: General Surgery

## 2022-10-12 DIAGNOSIS — M869 Osteomyelitis, unspecified: Secondary | ICD-10-CM | POA: Diagnosis not present

## 2022-10-12 DIAGNOSIS — I120 Hypertensive chronic kidney disease with stage 5 chronic kidney disease or end stage renal disease: Secondary | ICD-10-CM | POA: Diagnosis not present

## 2022-10-12 DIAGNOSIS — E1142 Type 2 diabetes mellitus with diabetic polyneuropathy: Secondary | ICD-10-CM | POA: Insufficient documentation

## 2022-10-12 DIAGNOSIS — L97512 Non-pressure chronic ulcer of other part of right foot with fat layer exposed: Secondary | ICD-10-CM | POA: Diagnosis not present

## 2022-10-12 DIAGNOSIS — E1122 Type 2 diabetes mellitus with diabetic chronic kidney disease: Secondary | ICD-10-CM | POA: Diagnosis not present

## 2022-10-12 DIAGNOSIS — N185 Chronic kidney disease, stage 5: Secondary | ICD-10-CM | POA: Insufficient documentation

## 2022-10-12 DIAGNOSIS — E11621 Type 2 diabetes mellitus with foot ulcer: Secondary | ICD-10-CM | POA: Insufficient documentation

## 2022-10-12 NOTE — Progress Notes (Addendum)
[name] ([id number]) [id number] Page 1 of 4 Visit Report for 10/12/2022 Abuse Risk Screen Details Patient Name: Date of Service: [name], [name] 10/12/2022 9:15 A M Medical Record Number: [id number] Patient Account Number: [id number] Date of Birth/Sex: Treating RN: 1951-07-01 (72 y.o. [name] Primary Care [name] Other Clinician: Referring [name]: Treating [name]/Extender: [name] in Treatment: 0 Abuse Risk Screen Items Answer ABUSE RISK SCREEN: Has anyone close to you tried to hurt or harm you recentlyo No Do you feel uncomfortable with anyone in your familyo No Has anyone forced you do things that you didnt want to doo No Electronic Signature(s) Signed: 10/13/2022 4:39:48 PM By: [name] East RN Entered By: [name] on 10/12/2022 09:41:00 -------------------------------------------------------------------------------- Activities of Daily Living Details Patient Name: Date of ServiceJodene [name], [name] 10/12/2022 9:15 A M Medical Record Number: [id number] Patient Account Number: [id number] Date of Birth/Sex: Treating RN: [birth year] (72 y.o. [name] Primary Care [name]: [name] Other Clinician: Referring [name]: Treating [name]/Extender: [name] in Treatment: 0 Activities of Daily Living Items Answer Activities of Daily Living (Please select one for each item) Drive Automobile Completely Able T Medications ake Completely Able Use T elephone Completely Able Care for Appearance Completely Able Use T oilet Completely Able Bath / Shower Completely Able Dress Self Completely Able Feed Self Completely Able Walk Completely Able Get In / Out Bed Completely Able Housework Completely Able Prepare Meals Completely Able Handle Money Completely Able Shop for Self Completely Able Electronic Signature(s) Signed: 10/13/2022 4:39:48 PM By: [name] East RN Previous Signature: 10/11/2022 5:12:28 PM Version By: [name] East RN Entered By: [name] on 10/12/2022 09:41:36 [name], [name] ([id number]) [id number] Page 2 of 4 -------------------------------------------------------------------------------- Education Screening Details Patient Name: Date of ServiceJodene [name], [name] 10/12/2022 9:15 A M Medical Record Number: [id number] Patient Account Number: [id number] Date of Birth/Sex: Treating RN: 1950/12/18 (72 y.o. [name] Primary Care [name]: [name] Other Clinician: Referring [name]: Treating [name] in Treatment: 0 Primary Learner Assessed: Patient Learning Preferences/Education Level/Primary Language Learning Preference: Explanation Highest Education Level: College or Above Preferred Language: English Cognitive Barrier Language Barrier: No Translator Needed: No Memory Deficit: No Emotional Barrier: No Cultural/Religious Beliefs Affecting Medical Care: No Physical Barrier Impaired Vision: Yes Glasses Impaired Hearing: No Decreased Hand dexterity: Yes Limitations: finger tips Knowledge/Comprehension Knowledge Level: Medium Comprehension Level: Medium Ability to understand written instructions: Medium Ability to understand verbal instructions: Medium Motivation Anxiety Level: Calm Cooperation: Cooperative Education Importance: Acknowledges Need Interest in Health Problems: Asks Questions Perception: Coherent Willingness to Engage in Self-Management High Activities: Readiness to Engage in Self-Management High Activities: Electronic Signature(s) Signed: 10/13/2022 4:39:48 PM By: [name] East RN Entered By: [name] on 10/12/2022 09:42:52 -------------------------------------------------------------------------------- Fall Risk Assessment Details Patient Name: Date of Service: [name], [name] 10/12/2022 9:15 A  M Medical Record Number: [id number] Patient Account Number: [id number] Date of Birth/Sex: Treating RN: [birth year] (72 y.o. [name] Primary Care Aramis Weil: [name] Other Clinician: Referring [name]: Treating [name] in Treatment: 0 Fall Risk Assessment Items [name], [name] ([id number]) [id number] Page 3 of 4 Have you had 2 or more falls in the last 12 monthso 0 No Have you had any fall that resulted in injury in the last 12 monthso 0 No FALLS RISK SCREEN History of falling - immediate or within 3 months 0 No Secondary diagnosis (Do you have  2 or more medical diagnoseso) 0 No Ambulatory aid None/bed rest/wheelchair/nurse 0 Yes Crutches/cane/walker 0 No Furniture 0 No Intravenous therapy Access/Saline/Heparin Lock 0 No Gait/Transferring Normal/ bed rest/ wheelchair 0 Yes Weak (short steps with or without shuffle, stooped but able to lift head while walking, may seek 0 No support from furniture) Impaired (short steps with shuffle, may have difficulty arising from chair, head down, impaired 0 No balance) Mental Status Oriented to own ability 0 Yes Electronic Signature(s) Signed: 10/13/2022 4:39:48 PM By: [name] East RN Entered By: [name] on 10/12/2022 09:43:12 -------------------------------------------------------------------------------- Foot Assessment Details Patient Name: Date of Service: [name], [name] 10/12/2022 9:15 A M Medical Record Number: [id number] Patient Account Number: [id number] Date of Birth/Sex: Treating RN: 11-Jan-1951 (72 y.o. [name] Primary Care Tristen Luce: [name] Other Clinician: Referring [name]: Treating [name] in Treatment: 0 Foot Assessment Items Site Locations + = Sensation present, - = Sensation absent, C = Callus, U = Ulcer R = Redness, W = Warmth, M = Maceration, PU = Pre-ulcerative  lesion F = Fissure, S = Swelling, D = Dryness Assessment Right: Left: Other Deformity: Yes No Prior Foot Ulcer: No No Prior Amputation: No No Charcot Joint: No No Ambulatory Status: Ambulatory With Help Assistance DeviceAlik [name], [name] ([id number]) [id number] Page 4 of 4 Gait: Steady Electronic Signature(s) Signed: 10/13/2022 4:39:48 PM By: [name] East RN Entered By: [name] on 10/12/2022 09:45:31 -------------------------------------------------------------------------------- Nutrition Risk Screening Details Patient Name: Date of Service: [name], [name] 10/12/2022 9:15 A M Medical Record Number: [id number] Patient Account Number: [id number] Date of Birth/Sex: Treating RN: [birth year] (72 y.o. [name] Primary Care Danil Wedge: [name] Other Clinician: Referring Gehrig Patras: Treating [name] in Treatment: 0 Height (in): 72 Weight (lbs): 185 Body Mass Index (BMI): 25.1 Nutrition Risk Screening Items Score Screening NUTRITION RISK SCREEN: I have an illness or condition that made me change the kind and/or amount of food I eat 0 No I eat fewer than two meals per day 0 No I eat few fruits and vegetables, or milk products 0 No I have three or more drinks of beer, liquor or wine almost every day 0 No I have tooth or mouth problems that make it hard for me to eat 0 No I don't always have enough money to buy the food I need 0 No I eat alone most of the time 0 No I take three or more different prescribed or over-the-counter drugs a day 1 Yes Without wanting to, I have lost or gained 10 pounds in the last six months 0 No I am not always physically able to shop, cook and/or feed myself 0 No Nutrition Protocols Good Risk Protocol 0 No interventions needed Moderate Risk Protocol High Risk Proctocol Risk Level: Good Risk Score: 1 Electronic Signature(s) Signed: 10/13/2022 4:39:48 PM By:  [name] East RN Entered By: [name] on 10/12/2022 09:43:29

## 2022-10-12 NOTE — Progress Notes (Addendum)
Bowbells, Xion ([id number]) [id number] Page 1 of 10 Visit Report for 10/12/2022 Allergy List Details Patient Name: Date of Service: [name], [name] 10/12/2022 9:15 A M Medical Record Number: [id number] Patient Account Number: [id number] Date of Birth/Sex: Treating RN: August 05, 1951 (73 y.o. [name] Primary Care Jaiceon Collister: [name] Other Clinician: Referring Abass Misener: Treating Reema Chick/Extender: [name] Weeks in Treatment: 0 Allergies Active Allergies losartan potassium sildenafil Allergy Notes Electronic Signature(s) Signed: 10/13/2022 4:39:48 PM By: Blanche East RN Previous Signature: 10/11/2022 5:03:04 PM Version By: Blanche East RN Entered By: Blanche East on 10/12/2022 09:34:15 -------------------------------------------------------------------------------- Arrival Information Details Patient Name: Date of Service: [name], [name] 10/12/2022 9:15 A M Medical Record Number: [id number] Patient Account Number: [id number] Date of Birth/Sex: Treating RN: [birth year] (72 y.o. [name] Primary Care Breckon Reeves: [name] Other Clinician: Referring Jamail Cullers: Treating Isami Mehra/Extender: [name] Weeks in Treatment: 0 Visit Information Patient Arrived: Ambulatory Arrival Time: 09:28 Accompanied By: self Transfer Assistance: None Patient Identification Verified: Yes Secondary Verification Process Completed: Yes Electronic Signature(s) Signed: 10/13/2022 4:39:48 PM By: Blanche East RN Entered By: Blanche East on 10/12/2022 09:30:22 -------------------------------------------------------------------------------- Clinic Level of Care Assessment Details Patient Name: Date of Service: [place], [name] 10/12/2022 9:15 [name], [name] ([id number]) [id number] Page 2 of 10 Medical Record Number: [id number] Patient Account Number: [id number] Date of Birth/Sex: Treating  RN: [birth year] (72 y.o. [name] Primary Care Joachim Carton: [name] Other Clinician: Referring Aaliah Jorgenson: Treating Kellyn Mccary/Extender: [name] Weeks in Treatment: 0 Clinic Level of Care Assessment Items TOOL 1 Quantity Score X- 1 0 Use when EandM and Procedure is performed on INITIAL visit ASSESSMENTS - Nursing Assessment / Reassessment X- 1 20 General Physical Exam (combine w/ comprehensive assessment (listed just below) when performed on new pt. evals) X- 1 25 Comprehensive Assessment (HX, ROS, Risk Assessments, Wounds Hx, etc.) ASSESSMENTS - Wound and Skin Assessment / Reassessment '[]'$  - 0 Dermatologic / Skin Assessment (not related to wound area) ASSESSMENTS - Ostomy and/or Continence Assessment and Care '[]'$  - 0 Incontinence Assessment and Management '[]'$  - 0 Ostomy Care Assessment and Management (repouching, etc.) PROCESS - Coordination of Care X - Simple Patient / Family Education for ongoing care 1 15 '[]'$  - 0 Complex (extensive) Patient / Family Education for ongoing care X- 1 10 Staff obtains Programmer, systems, Records, T Results / Process Orders est X- 1 10 Staff telephones HHA, Nursing Homes / Clarify orders / etc '[]'$  - 0 Routine Transfer to another Facility (non-emergent condition) '[]'$  - 0 Routine [name] Admission (non-emergent condition) X- 1 15 New Admissions / Biomedical engineer / Ordering NPWT Apligraf, etc. , '[]'$  - 0 Emergency [name] Admission (emergent condition) PROCESS - Special Needs '[]'$  - 0 Pediatric / Minor Patient Management '[]'$  - 0 Isolation Patient Management '[]'$  - 0 Hearing / Language / Visual special needs '[]'$  - 0 Assessment of Community assistance (transportation, D/C planning, etc.) '[]'$  - 0 Additional assistance / Altered mentation '[]'$  - 0 Support Surface(s) Assessment (bed, cushion, seat, etc.) INTERVENTIONS - Miscellaneous '[]'$  - 0 External ear exam '[]'$  - 0 Patient Transfer (multiple staff / Civil Service fast streamer / Similar  devices) '[]'$  - 0 Simple Staple / Suture removal (25 or less) '[]'$  - 0 Complex Staple / Suture removal (26 or more) '[]'$  - 0 Hypo/Hyperglycemic Management (do not check if billed separately) '[]'$  - 0 Ankle / Brachial Index (ABI) - do not check if billed separately Has the patient been seen at the [name] within  the last three years: Yes Total Score: 95 Level Of Care: New/Established - Level 3 Electronic Signature(s) Signed: 10/13/2022 4:39:48 PM By: Blanche East RN Entered By: Blanche East on 10/12/2022 10:23:52 [name], [name] ([id number]) [id number] Page 3 of 10 -------------------------------------------------------------------------------- Encounter Discharge Information Details Patient Name: Date of Service: [name], [name] 10/12/2022 9:15 A M Medical Record Number: [id number] Patient Account Number: [id number] Date of Birth/Sex: Treating RN: [birth year] (72 y.o. [name] Primary Care Schuyler Olden: [name] Other Clinician: Referring Joushua Dugar: Treating Thanos Cousineau/Extender: [name] Weeks in Treatment: 0 Encounter Discharge Information Items Post Procedure Vitals Discharge Condition: Stable Temperature (F): 98.0 Ambulatory Status: Ambulatory Pulse (bpm): 53 Discharge Destination: Home Respiratory Rate (breaths/min): 18 Transportation: Private Auto Blood Pressure (mmHg): 155/73 Accompanied By: self Schedule Follow-up Appointment: Yes Clinical Summary of Care: Electronic Signature(s) Signed: 10/13/2022 4:39:48 PM By: Blanche East RN Entered By: Blanche East on 10/12/2022 10:26:46 -------------------------------------------------------------------------------- Lower Extremity Assessment Details Patient Name: Date of Service: [facility], [name] 10/12/2022 9:15 A M Medical Record Number: [id number] Patient Account Number: [id number] Date of Birth/Sex: Treating RN: October 23, 1950 (72 y.o. [name] Primary Care Sherrod Toothman: [name] Other Clinician: Referring Tinsley Lomas: Treating Albertia Carvin/Extender: [name] Weeks in Treatment: 0 Edema Assessment Assessed: [Left: No] [Right: No] [Left: Edema] [Right: :] Calf Left: Right: Point of Measurement: From Medial Instep 37.5 cm Ankle Left: Right: Point of Measurement: From Medial Instep 23.5 cm Vascular Assessment Pulses: Dorsalis Pedis Palpable: [Right:Yes] Electronic Signature(s) Signed: 10/13/2022 4:39:48 PM By: Blanche East RN Entered By: Blanche East on 10/12/2022 09:46:04 [name], [name] ([id number]) [id number] Page 4 of 10 -------------------------------------------------------------------------------- Multi Wound Chart Details Patient Name: Date of Service: [name], [name] 10/12/2022 9:15 A M Medical Record Number: [id number] Patient Account Number: [id number] Date of Birth/Sex: Treating RN: [birth year] (72 y.o. [name] Primary Care Mardel Grudzien: [name] Other Clinician: Referring Ziyon Cedotal: Treating [name] Egerton/Extender: [name] Weeks in Treatment: 0 Vital Signs Height(in): 72 Pulse(bpm): 49 Weight(lbs): 185 Blood Pressure(mmHg): 155/73 Body Mass Index(BMI): 25.1 Temperature(F): 98.0 Respiratory Rate(breaths/min): 18 [1:Photos:] [N/A:N/A] Right, Medial T Great oe N/A N/A Wound Location: Pressure Injury N/A N/A Wounding Event: Diabetic Wound/Ulcer of the Lower N/A N/A Primary Etiology: Extremity Acute Osteomyelitis N/A N/A Secondary Etiology: Cataracts, Hypertension, Type II N/A N/A Comorbid History: Diabetes, Osteomyelitis, Neuropathy, Confinement Anxiety 08/16/2022 N/A N/A Date Acquired: 0 N/A N/A Weeks of Treatment: Open N/A N/A Wound Status: No N/A N/A Wound Recurrence: 0.7x0.5x0.4 N/A N/A Measurements L x W x D (cm) 0.275 N/A N/A A (cm) : rea 0.11 N/A N/A Volume (cm) : 12 Starting Position 1 (o'clock): 12 Ending Position 1  (o'clock): 0.3 Maximum Distance 1 (cm): Yes N/A N/A Undermining: Grade 3 N/A N/A Classification: MRI N/A N/A Earleen Newport Verification: Medium N/A N/A Exudate A mount: Serosanguineous N/A N/A Exudate Type: red, brown N/A N/A Exudate Color: Distinct, outline attached N/A N/A Wound Margin: Medium (34-66%) N/A N/A Granulation A mount: Pink, Pale N/A N/A Granulation Quality: Medium (34-66%) N/A N/A Necrotic A mount: Fat Layer (Subcutaneous Tissue): Yes N/A N/A Exposed Structures: Fascia: No Tendon: No Muscle: No Joint: No Bone: No Small (1-33%) N/A N/A Epithelialization: Debridement - Excisional N/A N/A Debridement: Pre-procedure Verification/Time Out 10:01 N/A N/A Taken: Subcutaneous, Slough N/A N/A Tissue Debrided: Skin/Subcutaneous Tissue N/A N/A Level: 0.35 N/A N/A Debridement A (sq cm): rea Curette N/A N/A Instrument: Minimum N/A N/A Bleeding: Pressure N/A N/A Hemostasis A chieved: Procedure was tolerated well N/A N/A Debridement Treatment Response: 0.7x0.5x0.4 N/A  N/A Post Debridement Measurements L x W x D (cm) Schnepf, Thadeus ([id number]) [phone] Page 5 of 10 0.11 N/A N/A Post Debridement Volume: (cm) Callus: Yes N/A N/A Periwound Skin Texture: No Abnormality N/A N/A Temperature: Yes N/A N/A Tenderness on Palpation: Debridement N/A N/A Procedures Performed: Treatment Notes Wound #1 (Toe Great) Wound Laterality: Right, Medial Cleanser Soap and Water Discharge Instruction: May shower and wash wound with dial antibacterial soap and water prior to dressing change. Peri-Wound Care Sween Lotion (Moisturizing lotion) Discharge Instruction: Apply moisturizing lotion as directed Topical Primary Dressing Sorbalgon AG Dressing, 4x4 (in/in) Discharge Instruction: Apply to wound bed as instructed Secondary Dressing Optifoam Non-Adhesive Dressing, 4x4 in Discharge Instruction: Apply over primary dressing as directed. Woven  Gauze Sponges 2x2 in Discharge Instruction: Apply over primary dressing as directed. Secured With Conforming Stretch Gauze Bandage, Sterile 2x75 (in/in) Discharge Instruction: Secure with stretch gauze as directed. Compression Wrap Compression Stockings Add-Ons Electronic Signature(s) Signed: 10/13/2022 4:39:48 PM By: Blanche East RN Entered By: Blanche East on 10/12/2022 10:30:28 -------------------------------------------------------------------------------- [name] Details Patient Name: Date of Service: [name], [name] 10/12/2022 9:15 A M Medical Record Number: [id number] Patient Account Number: [id number] Date of Birth/Sex: Treating RN: 1950/10/16 (72 y.o. [name] Primary Care Valetta Mulroy: [name] Other Clinician: Referring Takenya Travaglini: Treating Jaree Dwight/Extender: [name] Weeks in Treatment: 0 Active Inactive Nutrition Nursing Diagnoses: Impaired glucose control: actual or potential Goals: Patient/caregiver will maintain therapeutic glucose control Date Initiated: 10/12/2022 Target Resolution Date: 12/28/2022 Goal Status: Active ODIS, TURCK ([id number]) [id number] Page 6 of 10 Interventions: Provide education on elevated blood sugars and impact on wound healing Provide education on nutrition Treatment Activities: Dietary management education, guidance and counseling : 10/12/2022 Giving encouragement to exercise : 10/12/2022 Notes: Orientation to the Wound Care Program Nursing Diagnoses: Knowledge deficit related to the wound healing [name] Goals: Patient/caregiver will verbalize understanding of the [place] [facility] Date Initiated: 10/12/2022 Target Resolution Date: 12/28/2022 Goal Status: Active Interventions: Provide education on orientation to the wound [name] Notes: Osteomyelitis Nursing Diagnoses: Infection: osteomyelitis Goals: Patient/caregiver will verbalize  understanding of disease process and disease management Date Initiated: 10/12/2022 Target Resolution Date: 11/29/2022 Goal Status: Active Interventions: Assess for signs and symptoms of osteomyelitis resolution every visit Provide education on osteomyelitis Treatment Activities: MRI : 08/16/2022 Notes: Wound/Skin Impairment Nursing Diagnoses: Impaired tissue integrity Goals: Ulcer/skin breakdown will have a volume reduction of 30% by week 4 Date Initiated: 10/12/2022 Target Resolution Date: 11/30/2022 Goal Status: Active Interventions: Assess ulceration(s) every visit Provide education on smoking Treatment Activities: Skin care regimen initiated : 10/12/2022 Smoking cessation education : 10/12/2022 Notes: Electronic Signature(s) Signed: 10/13/2022 4:39:48 PM By: Blanche East RN Entered By: Blanche East on 10/12/2022 10:15:15 [name], [name] ([id number]) [id number] Page 7 of 10 -------------------------------------------------------------------------------- Pain Assessment Details Patient Name: Date of ServiceJodene [name], [name] 10/12/2022 9:15 A M Medical Record Number: [id number] Patient Account Number: [id number] Date of Birth/Sex: Treating RN: 1951/03/31 (72 y.o. [name] Primary Care Adanely Reynoso: [name] Other Clinician: Referring Maitlyn Penza: Treating Cyndia Degraff/Extender: [name] Weeks in Treatment: 0 Active Problems Location of Pain Severity and Description of Pain Patient Has Paino No Site Locations Rate the pain. Current Pain Level: 0 Pain Management and Medication Current Pain Management: Electronic Signature(s) Signed: 10/13/2022 4:39:48 PM By: Blanche East RN Entered By: Blanche East on 10/12/2022 10:09:22 -------------------------------------------------------------------------------- Patient/Caregiver Education Details Patient Name: Date of Service: [name], [name] 1/11/2024andnbsp9:15 [place] Record  Number: [id number] Patient Account  Number: [id number] Date of Birth/Gender: Treating RN: Dec 22, 1950 (72 y.o. [name] Primary Care Physician: [name] Other Clinician: Referring Physician: Treating Physician/Extender: Levin Erp in Treatment: 0 Education Assessment Education Provided To: Patient Education Topics Provided Elevated Blood Sugar/ Impact on Healing: Handouts: Elevated Blood Sugars: How Do They Affect Wound Healing Methods: Explain/Verbal Responses: Reinforcements needed, State content correctly Hyperbaric Oxygenation: Handouts: Hyperbaric Oxygen Methods: Explain/Verbal Responses: Reinforcements needed, State content correctly [name], [place] ([id number]) [phone].pdf Page 8 of 10 Infection: Welcome T The [name]-New Patient Packet: o Handouts: Hyperbaric Oxygen, The Wound Healing Pledge form, Welcome T The [place] o Methods: Explain/Verbal Responses: Reinforcements needed, State content correctly Electronic Signature(s) Signed: 10/13/2022 4:39:48 PM By: Blanche East RN Entered By: Blanche East on 10/12/2022 10:22:47 -------------------------------------------------------------------------------- Wound Assessment Details Patient Name: Date of Service: [name], [name] 10/12/2022 9:15 A M Medical Record Number: [id number] Patient Account Number: [id number] Date of Birth/Sex: Treating RN: 06-23-1951 (72 y.o. [name] Primary Care Damont Balles: [name] Other Clinician: Referring Arwilda [place]: Treating Michiel Sivley/Extender: [name] Weeks in Treatment: 0 Wound Status Wound Number: 1 Primary Diabetic Wound/Ulcer of the Lower Extremity Etiology: Wound Location: Right, Medial T Great oe Secondary Acute Osteomyelitis Wounding Event: Pressure Injury Etiology: Date Acquired: 08/16/2022 Wound Open Weeks Of Treatment: 0 Status: Clustered Wound: No Comorbid Cataracts,  Hypertension, Type II Diabetes, Osteomyelitis, History: Neuropathy, Confinement Anxiety Photos Wound Measurements Length: (cm) 0.7 Width: (cm) 0.5 Depth: (cm) 0.4 Area: (cm) 0.275 Volume: (cm) 0.11 % Reduction in Area: % Reduction in Volume: Epithelialization: Small (1-33%) Tunneling: No Undermining: Yes Starting Position (o'clock): 12 Ending Position (o'clock): 12 Maximum Distance: (cm) 0.3 Wound Description Classification: Grade 3 Wagner Verification: MRI Wound Margin: Distinct, outline attached Exudate Amount: Medium Exudate Type: Serosanguineous Exudate Color: red, brown Foul Odor After Cleansing: No Slough/Fibrino Yes Wound Bed Granulation Amount: Medium (34-66%) Exposed Structure Granulation Quality: Pink, Pale Fascia Exposed: No [name], [name] ([id number]) [id number] Page 9 of 10 Necrotic Amount: Medium (34-66%) Fat Layer (Subcutaneous Tissue) Exposed: Yes Necrotic Quality: Adherent Slough Tendon Exposed: No Muscle Exposed: No Joint Exposed: No Bone Exposed: No Periwound Skin Texture Texture Color No Abnormalities Noted: No No Abnormalities Noted: No Callus: Yes Temperature / Pain Temperature: No Abnormality Moisture No Abnormalities Noted: No Tenderness on Palpation: Yes Treatment Notes Wound #1 (Toe Great) Wound Laterality: Right, Medial Cleanser Soap and Water Discharge Instruction: May shower and wash wound with dial antibacterial soap and water prior to dressing change. Peri-Wound Care Sween Lotion (Moisturizing lotion) Discharge Instruction: Apply moisturizing lotion as directed Topical Primary Dressing Sorbalgon AG Dressing, 4x4 (in/in) Discharge Instruction: Apply to wound bed as instructed Secondary Dressing Optifoam Non-Adhesive Dressing, 4x4 in Discharge Instruction: Apply over primary dressing as directed. Woven Gauze Sponges 2x2 in Discharge Instruction: Apply over primary dressing as directed. Secured  With Conforming Stretch Gauze Bandage, Sterile 2x75 (in/in) Discharge Instruction: Secure with stretch gauze as directed. Compression Wrap Compression Stockings Add-Ons Electronic Signature(s) Signed: 10/13/2022 4:39:48 PM By: Blanche East RN Entered By: Blanche East on 10/12/2022 10:05:26 -------------------------------------------------------------------------------- Vitals Details Patient Name: Date of Service: [name], [name] 10/12/2022 9:15 A M Medical Record Number: [id number] Patient Account Number: [id number] Date of Birth/Sex: Treating RN: 01-27-1951 (72 y.o. [name] Primary Care Arek Spadafore: [name] Other Clinician: Referring Caniyah Murley: Treating Twan Harkin/Extender: [name] Weeks in Treatment: 0 Vital Signs Time Taken: 09:30 Temperature (F): 98.0 Height (in): 72 Pulse (bpm): 53 Source: Stated Respiratory Rate (breaths/min): 18  Weight (lbs): 185 Blood Pressure (mmHg): 155/73 Source: Stated Reference Range: 80 - 120 mg / dl Marcano, Bridger ([id number]) [phone].pdf Page 10 of 10 Body Mass Index (BMI): 25.1 Airway Pulse Oximetry (%): 95 Electronic Signature(s) Signed: 10/13/2022 4:39:48 PM By: Blanche East RN Entered By: Blanche East on 10/12/2022 09:33:54

## 2022-10-13 ENCOUNTER — Other Ambulatory Visit (HOSPITAL_BASED_OUTPATIENT_CLINIC_OR_DEPARTMENT_OTHER): Payer: Self-pay | Admitting: General Surgery

## 2022-10-13 DIAGNOSIS — M869 Osteomyelitis, unspecified: Secondary | ICD-10-CM

## 2022-10-14 NOTE — Progress Notes (Signed)
[name], [name] ([id number]) [id number] Page 1 of 10 Visit Report for 10/12/2022 Chief Complaint Document Details Patient Name: Date of Service: [name], [name] 10/12/2022 9:15 A M Medical Record Number: [id number] Patient Account Number: [id number] Date of Birth/Sex: Treating RN: January 21, 1951 (72 y.o. M) Primary Care Provider: [name] Other Clinician: Referring Provider: Treating Provider/Extender: [name] Weeks in Treatment: 0 Information Obtained from: Patient Chief Complaint Patients presents for treatment of an open diabetic ulcer on the right great toe Electronic Signature(s) Signed: 10/12/2022 9:49:08 AM By: Fredirick Maudlin MD FACS Entered By: Fredirick Maudlin on 10/12/2022 09:49:08 -------------------------------------------------------------------------------- Debridement Details Patient Name: Date of Service: [name], [name] 10/12/2022 9:15 A M Medical Record Number: [id number] Patient Account Number: [id number] Date of Birth/Sex: Treating RN: 1950/12/30 (72 y.o. M) Primary Care Provider: [name] Other Clinician: Referring Provider: Treating Provider/Extender: [name] Weeks in Treatment: 0 Debridement Performed for Assessment: Wound #1 Right,Medial T Great oe Performed By: Physician Fredirick Maudlin, MD Debridement Type: Debridement Severity of Tissue Pre Debridement: Fat layer exposed Level of Consciousness (Pre-procedure): Awake and Alert Pre-procedure Verification/Time Out Yes - 10:01 Taken: Start Time: 10:02 T Area Debrided (L x W): otal 0.7 (cm) x 0.5 (cm) = 0.35 (cm) Tissue and other material debrided: Non-Viable, Slough, Subcutaneous, Skin: Dermis , Slough Level: Skin/Subcutaneous Tissue Debridement Description: Excisional Instrument: Curette Bleeding: Minimum Hemostasis Achieved: Pressure Response to Treatment: Procedure was tolerated well Level of Consciousness (Post- Awake and  Alert procedure): Post Debridement Measurements of Total Wound Length: (cm) 0.7 Width: (cm) 0.5 Depth: (cm) 0.4 Volume: (cm) 0.11 Character of Wound/Ulcer Post Debridement: Requires Further Debridement Severity of Tissue Post Debridement: Fat layer exposed Post Procedure Diagnosis Same as Pre-procedure [name], [name] ([id number]) [id number] Page 2 of 10 Notes Scribed for Dr. Celine Ahr by Blanche East, RN Electronic Signature(s) Signed: 10/12/2022 10:17:33 AM By: Fredirick Maudlin MD FACS Entered By: Fredirick Maudlin on 10/12/2022 10:17:33 -------------------------------------------------------------------------------- HPI Details Patient Name: Date of Service: [name], [name] 10/12/2022 9:15 A M Medical Record Number: [id number] Patient Account Number: [id number] Date of Birth/Sex: Treating RN: Nov 10, 1950 (72 y.o. M) Primary Care Provider: [name] Other Clinician: Referring Provider: Treating Provider/Extender: [name] Weeks in Treatment: 0 History of Present Illness HPI Description: ADMISSION 10/12/2022 This is a 72 year old type II diabetic (last hemoglobin A1c 7.7 in October 2023) with stage V chronic kidney disease and hypertension. He has been followed by podiatry for an ulcer on his right great toe. He has a history of prior toe and foot infections requiring amputation. In November 2023, he had an outpatient MRI that demonstrated osteomyelitis. He was admitted to the hospital for IV antibiotics and subsequently underwent irrigation and debridement of the toe with biopsy and placement of married 3 layer matrix. Cultures grew out MRSA and osteomyelitis was seen on pathology. He was followed by infectious disease and given a course of doxycycline and Augmentin. He completed this course at the end of December. He has been evaluated by vascular surgery with formal ABIs as well as consultation. He does have adequate blood flow at  least to the dorsalis pedis. He last saw podiatry on January 4 of this year where he underwent debridement. It was felt he would benefit from specialized wound care and he was referred to our center for further evaluation and management. On the dorsal aspect of his right great toe, there is a circular ulcer. There is a fair amount of undermining from callus and dry skin. The surface  is a bit pale and fibrotic, but there is no obvious necrosis. Electronic Signature(s) Signed: 10/12/2022 10:16:44 AM By: Fredirick Maudlin MD FACS Previous Signature: 10/12/2022 10:15:42 AM Version By: Fredirick Maudlin MD FACS Entered By: Fredirick Maudlin on 10/12/2022 10:16:44 -------------------------------------------------------------------------------- Physical Exam Details Patient Name: Date of Service: [name], [name] 10/12/2022 9:15 A M Medical Record Number: [id number] Patient Account Number: [id number] Date of Birth/Sex: Treating RN: 22-Jul-1951 (72 y.o. M) Primary Care Provider: [name] Other Clinician: Referring Provider: Treating Provider/Extender: [name] Weeks in Treatment: 0 Constitutional Hypertensive, asymptomatic. Bradycardic, asymptomatic. . . No acute distress. Respiratory Normal work of breathing on room air. Notes 10/12/2022: On the dorsal aspect of his right great toe, there is a circular ulcer. There is a fair amount of undermining from callus and dry skin. The surface is a bit pale and fibrotic, but there is no obvious necrosis. Electronic Signature(s) Signed: 10/12/2022 10:16:22 AM By: Fredirick Maudlin MD FACS [place], [name] ([id number]) [id number] Page 3 of 10 Entered By: Fredirick Maudlin on 10/12/2022 10:16:21 -------------------------------------------------------------------------------- Physician Orders Details Patient Name: Date of Service: [name], [name] 10/12/2022 9:15 A M Medical Record Number: [id number] Patient Account  Number: [id number] Date of Birth/Sex: Treating RN: 08/31/1951 (72 y.o. [name] Primary Care Provider: [name] Other Clinician: Referring Provider: Treating Provider/Extender: [name] Weeks in Treatment: 0 Verbal / Phone Orders: No Diagnosis Coding ICD-10 Coding Code Description [phone] Non-pressure chronic ulcer of other part of right foot with fat layer exposed E11.621 Type 2 diabetes mellitus with foot ulcer N18.5 Chronic kidney disease, stage 5 M86.9 Osteomyelitis, unspecified E11.42 Type 2 diabetes mellitus with diabetic polyneuropathy I10 Essential (primary) hypertension Follow-up Appointments ppointment in 1 week. - Dr. Celine Ahr Rm 2 Return A Thursday 10/19/22 at 10:45am Anesthetic Wound #1 Right,Medial T Great oe (In clinic) Topical Lidocaine 5% applied to wound bed - prior to debridement Bathing/ Shower/ Hygiene May shower and wash wound with soap and water. Off-Loading Wound #1 Right,Medial T Great oe Other: - Diabetic shoe Hyperbaric Oxygen Therapy Wound #1 Right,Medial T Great oe Evaluate for HBO Therapy Indication: - chronic refractory osteomyelitis If appropriate for treatment, begin HBOT per protocol: 2.0 ATA for 90 Minutes without A Breaks ir Total Number of Treatments: - 40 One treatments per day (delivered Monday through Friday unless otherwise specified in Special Instructions below): Finger stick Blood Glucose Pre- and Post- HBOT Treatment. Follow Hyperbaric Oxygen Glycemia Protocol A frin (Oxymetazoline HCL) 0.05% nasal spray - 1 spray in both nostrils daily as needed prior to HBO treatment for difficulty clearing ears Wound Treatment Wound #1 - T Great oe Wound Laterality: Right, Medial Cleanser: Soap and Water Discharge Instructions: May shower and wash wound with dial antibacterial soap and water prior to dressing change. Peri-Wound Care: Sween Lotion (Moisturizing lotion) Discharge Instructions: Apply moisturizing  lotion as directed Prim Dressing: Sorbalgon AG Dressing, 4x4 (in/in) ary Discharge Instructions: Apply to wound bed as instructed Secondary Dressing: Optifoam Non-Adhesive Dressing, 4x4 in Discharge Instructions: Apply over primary dressing as directed. Secondary Dressing: Woven Gauze Sponges 2x2 in Discharge Instructions: Apply over primary dressing as directed. [place], Raahil ([id number]) [id number] Page 4 of 10 Secured With: Child psychotherapist, Sterile 2x75 (in/in) Discharge Instructions: Secure with stretch gauze as directed. GLYCEMIA INTERVENTIONS PROTOCOL PRE-HBO GLYCEMIA INTERVENTIONS ACTION INTERVENTION Obtain pre-HBO capillary blood glucose (ensure 1 physician order is in chart). A. Notify HBO physician and await physician orders. 2 If result is 70 mg/dl or below: B. If  the result meets the hospital definition of a critical result, follow hospital policy. A. Give patient an 8 ounce Glucerna Shake, an 8 ounce Ensure, or 8 ounces of a Glucerna/Ensure equivalent dietary supplement*. B. Wait 30 minutes. If result is 71 mg/dl to 130 mg/dl: C. Retest patients capillary blood glucose (CBG). D. If result greater than or equal to 110 mg/dl, proceed with HBO. If result less than 110 mg/dl, notify HBO physician and consider holding HBO. If result is 131 mg/dl to 249 mg/dl: A. Proceed with HBO. A. Notify HBO physician and await physician orders. B. It is recommended to hold HBO and do If result is 250 mg/dl or greater: blood/urine ketone testing. C. If the result meets the hospital definition of a critical result, follow hospital policy. POST-HBO GLYCEMIA INTERVENTIONS ACTION INTERVENTION Obtain post HBO capillary blood glucose (ensure 1 physician order is in chart). A. Notify HBO physician and await physician orders. 2 If result is 70 mg/dl or below: B. If the result meets the hospital definition of a critical result,  follow hospital policy. A. Give patient an 8 ounce Glucerna Shake, an 8 ounce Ensure, or 8 ounces of a Glucerna/Ensure equivalent dietary supplement*. B. Wait 15 minutes for symptoms of If result is 71 mg/dl to 100 mg/dl: hypoglycemia (i.e. nervousness, anxiety, sweating, chills, clamminess, irritability, confusion, tachycardia or dizziness). C. If patient asymptomatic, discharge patient. If patient symptomatic, repeat capillary blood glucose (CBG) and notify HBO physician. If result is 101 mg/dl to 249 mg/dl: A. Discharge patient. A. Notify HBO physician and await physician orders. B. It is recommended to do blood/urine ketone If result is 250 mg/dl or greater: testing. C. If the result meets the hospital definition of a critical result, follow hospital policy. *Juice or candies are NOT equivalent products. If patient refuses the Glucerna or Ensure, please consult the hospital dietitian for an appropriate substitute. Electronic Signature(s) Signed: 10/12/2022 3:25:43 PM By: Fredirick Maudlin MD FACS Previous Signature: 10/12/2022 10:30:34 AM Version By: Fredirick Maudlin MD FACS Entered By: Fredirick Maudlin on 10/12/2022 15:08:09 -------------------------------------------------------------------------------- Problem List Details Patient Name: Date of Service: [name], [name] 10/12/2022 9:15 A M Medical Record Number: [id number] Patient Account Number: [id number] Date of Birth/Sex: Treating RN: 1951/08/03 (72 y.o. M) Primary Care Provider: [name] Other Clinician: Referring Provider: Treating Provider/Extender: [name] Weeks in Treatment: 0 [place], [name] ([id number]) [id number] Page 5 of 10 Active Problems ICD-10 Encounter Code Description Active Date MDM Diagnosis L97.512 Non-pressure chronic ulcer of other part of right foot with fat layer exposed 10/12/2022 No Yes E11.621 Type 2 diabetes mellitus with foot ulcer  10/12/2022 No Yes N18.5 Chronic kidney disease, stage 5 10/12/2022 No Yes M86.9 Osteomyelitis, unspecified 10/12/2022 No Yes E11.42 Type 2 diabetes mellitus with diabetic polyneuropathy 10/12/2022 No Yes I10 Essential (primary) hypertension 10/12/2022 No Yes Inactive Problems Resolved Problems Electronic Signature(s) Signed: 10/12/2022 9:48:15 AM By: Fredirick Maudlin MD FACS Entered By: Fredirick Maudlin on 10/12/2022 09:48:15 -------------------------------------------------------------------------------- Progress Note Details Patient Name: Date of Service: [name], [name] 10/12/2022 9:15 A M Medical Record Number: [id number] Patient Account Number: [id number] Date of Birth/Sex: Treating RN: 04/19/1951 (72 y.o. M) Primary Care Provider: [name] Other Clinician: Referring Provider: Treating Provider/Extender: [name] Weeks in Treatment: 0 Subjective Chief Complaint Information obtained from Patient Patients presents for treatment of an open diabetic ulcer on the right great toe History of Present Illness (HPI) ADMISSION 10/12/2022 This is a 72 year old type II diabetic (last hemoglobin A1c 7.7 in October  2023) with stage V chronic kidney disease and hypertension. He has been followed by podiatry for an ulcer on his right great toe. He has a history of prior toe and foot infections requiring amputation. In November 2023, he had an outpatient MRI that demonstrated osteomyelitis. He was admitted to the hospital for IV antibiotics and subsequently underwent irrigation and debridement of the toe with biopsy and placement of married 3 layer matrix. Cultures grew out MRSA and osteomyelitis was seen on pathology. He was followed by infectious disease and given a course of doxycycline and Augmentin. He completed this course at the end of December. He has been evaluated by vascular surgery with formal ABIs as well as consultation. He does have adequate blood flow at least to  the dorsalis pedis. He last saw podiatry on January 4 of this year where he underwent debridement. It was felt he would benefit from specialized wound care and he was referred to our center for further evaluation and management. On the dorsal aspect of his right great toe, there is a circular ulcer. There is a fair amount of undermining from callus and dry skin. The surface is a bit pale and fibrotic, but there is no obvious necrosis. Patient History [name], [name] ([id number]) [id number] Page 6 of 10 Information obtained from Patient. Allergies losartan potassium, sildenafil Family History Diabetes - Mother,Siblings, Heart Disease - Siblings, Hypertension - Siblings, Seizures - Father, Stroke - Siblings, No family history of Cancer, Hereditary Spherocytosis, Kidney Disease, Lung Disease, Thyroid Problems, Tuberculosis. Social History Former smoker, Marital Status - Married, Alcohol Use - Rarely, Drug Use - No History, Caffeine Use - Daily. Medical History Eyes Patient has history of Cataracts - rt eye Ear/Nose/Mouth/Throat Denies history of Chronic sinus problems/congestion Hematologic/Lymphatic Denies history of Anemia, Hemophilia, Human Immunodeficiency Virus, Lymphedema, Sickle Cell Disease Respiratory Denies history of Aspiration, Asthma, Chronic Obstructive Pulmonary Disease (COPD), Pneumothorax, Tuberculosis Cardiovascular Patient has history of Hypertension Gastrointestinal Denies history of Cirrhosis , Colitis, Crohnoos, Hepatitis A, Hepatitis B, Hepatitis C Endocrine Patient has history of Type II Diabetes Genitourinary Denies history of End Stage Renal Disease Immunological Denies history of Lupus Erythematosus, Raynaudoos, Scleroderma Integumentary (Skin) Denies history of History of Burn Musculoskeletal Patient has history of Osteomyelitis - MRI confirmed (08/16/22) Denies history of Gout Neurologic Patient has history of  Neuropathy Psychiatric Patient has history of Confinement Anxiety Denies history of Anorexia/bulimia Patient is treated with Insulin. Blood sugar is tested. Medical A Surgical History Notes nd Genitourinary CKD IV Review of Systems (ROS) Constitutional Symptoms (General Health) Denies complaints or symptoms of Fatigue, Fever, Chills, Marked Weight Change. Eyes Complains or has symptoms of Glasses / Contacts. Denies complaints or symptoms of Dry Eyes, Vision Changes. Ear/Nose/Mouth/Throat Denies complaints or symptoms of Chronic sinus problems or rhinitis. Integumentary (Skin) Complains or has symptoms of Wounds - Rt great toe. Musculoskeletal Denies complaints or symptoms of Muscle Pain, Muscle Weakness. Psychiatric Denies complaints or symptoms of Claustrophobia. Objective Constitutional Hypertensive, asymptomatic. Bradycardic, asymptomatic. No acute distress. Vitals Time Taken: 9:30 AM, Height: 72 in, Source: Stated, Weight: 185 lbs, Source: Stated, BMI: 25.1, Temperature: 98.0 F, Pulse: 53 bpm, Respiratory Rate: 18 breaths/min, Blood Pressure: 155/73 mmHg, Pulse Oximetry: 95 %. Respiratory Normal work of breathing on room air. General Notes: 10/12/2022: On the dorsal aspect of his right great toe, there is a circular ulcer. There is a fair amount of undermining from callus and dry skin. The surface is a bit pale and fibrotic, but there is no obvious necrosis. Integumentary (Hair,  Skin) Wound #1 status is Open. Original cause of wound was Pressure Injury. The date acquired was: 08/16/2022. The wound is located on the Right,Medial T [place]. The wound measures 0.7cm length x 0.5cm width x 0.4cm depth; 0.275cm^2 area and 0.11cm^3 volume. There is Fat Layer (Subcutaneous Tissue) exposed. There is no tunneling noted, however, there is undermining starting at 12:00 and ending at 12:00 with a maximum distance of 0.3cm. There is a [place], [name] ([id number])  [id number] Page 7 of 10 medium amount of serosanguineous drainage noted. The wound margin is distinct with the outline attached to the wound base. There is medium (34-66%) pink, pale granulation within the wound bed. There is a medium (34-66%) amount of necrotic tissue within the wound bed including Adherent Slough. The periwound skin appearance exhibited: Callus. Periwound temperature was noted as No Abnormality. The periwound has tenderness on palpation. Assessment Active Problems ICD-10 Non-pressure chronic ulcer of other part of right foot with fat layer exposed Type 2 diabetes mellitus with foot ulcer Chronic kidney disease, stage 5 Osteomyelitis, unspecified Type 2 diabetes mellitus with diabetic polyneuropathy Essential (primary) hypertension Procedures Wound #1 Pre-procedure diagnosis of Wound #1 is a Diabetic Wound/Ulcer of the Lower Extremity located on the Right,Medial T Great .Severity of Tissue Pre oe Debridement is: Fat layer exposed. There was a Excisional Skin/Subcutaneous Tissue Debridement with a total area of 0.35 sq cm performed by Fredirick Maudlin, MD. With the following instrument(s): Curette to remove Non-Viable tissue/material. Material removed includes Subcutaneous Tissue, Slough, and Skin: Dermis. A time out was conducted at 10:01, prior to the start of the procedure. A Minimum amount of bleeding was controlled with Pressure. The procedure was tolerated well. Post Debridement Measurements: 0.7cm length x 0.5cm width x 0.4cm depth; 0.11cm^3 volume. Character of Wound/Ulcer Post Debridement requires further debridement. Severity of Tissue Post Debridement is: Fat layer exposed. Post procedure Diagnosis Wound #1: Same as Pre-Procedure General Notes: Scribed for Dr. Celine Ahr by Blanche East, RN. Plan Follow-up Appointments: Return Appointment in 1 week. - Dr. Celine Ahr Rm 2 Thursday 10/19/22 at 10:45am Anesthetic: Wound #1 Right,Medial T  Great: oe (In clinic) Topical Lidocaine 5% applied to wound bed - prior to debridement Bathing/ Shower/ Hygiene: May shower and wash wound with soap and water. Off-Loading: Wound #1 Right,Medial T Great: oe Other: - Diabetic shoe Hyperbaric Oxygen Therapy: Wound #1 Right,Medial T Great: oe Evaluate for HBO Therapy Indication: - chronic refractory osteomyelitis If appropriate for treatment, begin HBOT per protocol: 2.0 ATA for 90 Minutes without Air Breaks T Number of Treatments: - 40 otal One treatments per day (delivered Monday through Friday unless otherwise specified in Special Instructions below): Finger stick Blood Glucose Pre- and Post- HBOT Treatment. Follow Hyperbaric Oxygen Glycemia Protocol Afrin (Oxymetazoline HCL) 0.05% nasal spray - 1 spray in both nostrils daily as needed prior to HBO treatment for difficulty clearing ears WOUND #1: - T Great Wound Laterality: Right, Medial oe Cleanser: Soap and Water Discharge Instructions: May shower and wash wound with dial antibacterial soap and water prior to dressing change. Peri-Wound Care: Sween Lotion (Moisturizing lotion) Discharge Instructions: Apply moisturizing lotion as directed Prim Dressing: Sorbalgon AG Dressing, 4x4 (in/in) ary Discharge Instructions: Apply to wound bed as instructed Secondary Dressing: Optifoam Non-Adhesive Dressing, 4x4 in Discharge Instructions: Apply over primary dressing as directed. Secondary Dressing: Woven Gauze Sponges 2x2 in Discharge Instructions: Apply over primary dressing as directed. Secured With: Child psychotherapist, Sterile 2x75 (in/in) Discharge Instructions: Secure with stretch gauze as directed. 10/12/2022: This  is a type II diabetic with osteomyelitis and a great toe ulcer. This makes this a Wagner grade 3 ulcer in the setting of chronic refractory osteomyelitis, as he has already undergone 6 weeks of antibiotic therapy, as well as bone debridement. He has had  vascular studies that demonstrate adequate flow at least to the dorsalis pedis. On the dorsal aspect of his right great toe, there is a circular ulcer. There is a fair amount of undermining from callus and dry skin. The surface is a bit pale and fibrotic, but there is no obvious necrosis. I used a curette to debride slough, nonviable subcutaneous tissue, and skin from the ulcer, to eliminate the undermining. We are going to change his dressing to silver alginate. I think this patient may benefit from hyperbaric oxygen therapy. I had a brief discussion with him about what that might entail and after our [name], [name] ([id number]) [id number] Page 8 of 10 visit, he will have a more in-depth discussion with our hyperbaric oxygen technicians. I would propose an initial course of 40 treatments at 2.0 atm with 0 air breaks. If he wishes to pursue this, he will need an updated EKG and chest x-ray. Follow-up in 1 week. Electronic Signature(s) Signed: 10/12/2022 3:08:20 PM By: Fredirick Maudlin MD FACS Previous Signature: 10/12/2022 10:19:56 AM Version By: Fredirick Maudlin MD FACS Entered By: Fredirick Maudlin on 10/12/2022 15:08:20 -------------------------------------------------------------------------------- HxROS Details Patient Name: Date of Service: [facility] [name], [name] 10/12/2022 9:15 A M Medical Record Number: [id number] Patient Account Number: [id number] Date of Birth/Sex: Treating RN: Dec 23, 1950 (72 y.o. [name] Primary Care Provider: [name] Other Clinician: Referring Provider: Treating Provider/Extender: [name] Weeks in Treatment: 0 Information Obtained From Patient Constitutional Symptoms (General Health) Complaints and Symptoms: Negative for: Fatigue; Fever; Chills; Marked Weight Change Eyes Complaints and Symptoms: Positive for: Glasses / Contacts Negative for: Dry Eyes; Vision Changes Medical History: Positive for:  Cataracts - rt eye Ear/Nose/Mouth/Throat Complaints and Symptoms: Negative for: Chronic sinus problems or rhinitis Medical History: Negative for: Chronic sinus problems/congestion Integumentary (Skin) Complaints and Symptoms: Positive for: Wounds - Rt great toe Medical History: Negative for: History of Burn Musculoskeletal Complaints and Symptoms: Negative for: Muscle Pain; Muscle Weakness Medical History: Positive for: Osteomyelitis - MRI confirmed (08/16/22) Negative for: Gout Psychiatric Complaints and Symptoms: Negative for: Claustrophobia Medical History: Positive for: Confinement Anxiety Negative for: Anorexia/bulimia Hematologic/Lymphatic Medical HistoryROLIN, SCHULT ([id number]) [id number] Page 9 of 10 Negative for: Anemia; Hemophilia; Human Immunodeficiency Virus; Lymphedema; Sickle Cell Disease Respiratory Medical History: Negative for: Aspiration; Asthma; Chronic Obstructive Pulmonary Disease (COPD); Pneumothorax; Tuberculosis Cardiovascular Medical History: Positive for: Hypertension Gastrointestinal Medical History: Negative for: Cirrhosis ; Colitis; Crohns; Hepatitis A; Hepatitis B; Hepatitis C Endocrine Medical History: Positive for: Type II Diabetes Time with diabetes: 2000 Treated with: Insulin Blood sugar tested every day: Yes Tested : Genitourinary Medical History: Negative for: End Stage Renal Disease Past Medical History Notes: CKD IV Immunological Medical History: Negative for: Lupus Erythematosus; Raynauds; Scleroderma Neurologic Medical History: Positive for: Neuropathy Oncologic HBO Extended History Items Eyes: Cataracts Immunizations Pneumococcal Vaccine: Received Pneumococcal Vaccination: Yes Received Pneumococcal Vaccination On or After 60th Birthday: Yes Implantable Devices None Family and Social History Cancer: No; Diabetes: Yes - Mother,Siblings; Heart Disease: Yes - Siblings; Hereditary  Spherocytosis: No; Hypertension: Yes - Siblings; Kidney Disease: No; Lung Disease: No; Seizures: Yes - Father; Stroke: Yes - Siblings; Thyroid Problems: No; Tuberculosis: No; Former smoker; Marital Status - Married; Alcohol Use: Rarely; Drug Use: No History;  Caffeine Use: Daily; Financial Concerns: No; Food, Clothing or Shelter Needs: No; Support System Lacking: No; Transportation Concerns: No Physician Affirmation I have reviewed and agree with the above information. Electronic Signature(s) Signed: 10/12/2022 10:30:34 AM By: Fredirick Maudlin MD FACS Signed: 10/13/2022 4:39:48 PM By: Blanche East RN Entered By: Blanche East on 10/12/2022 09:40:35 [place], [name] ([id number]) [id number] Page 10 of 10 -------------------------------------------------------------------------------- SuperBill Details Patient Name: Date of ServiceJodene [name], [name] 10/12/2022 Medical Record Number: [id number] Patient Account Number: [id number] Date of Birth/Sex: Treating RN: 10/06/1950 (72 y.o. M) Primary Care Provider: [name] Other Clinician: Referring Provider: Treating Provider/Extender: [name] Weeks in Treatment: 0 Diagnosis Coding ICD-10 Codes Code Description [phone] Non-pressure chronic ulcer of other part of right foot with fat layer exposed E11.621 Type 2 diabetes mellitus with foot ulcer N18.5 Chronic kidney disease, stage 5 M86.9 Osteomyelitis, unspecified E11.42 Type 2 diabetes mellitus with diabetic polyneuropathy I10 Essential (primary) hypertension Facility Procedures : CPT4 Code: [id number] Description: [facility] VISIT-LEV 3 EST PT Modifier: 25 Quantity: 1 : CPT4 Code: 81275170 Description: [place] - DEB SUBQ TISSUE 20 SQ CM/< ICD-10 Diagnosis Description L97.512 Non-pressure chronic ulcer of other part of right foot with fat layer exposed Modifier: Quantity: 1 Physician Procedures : CPT4 Code Description Modifier [id number]  [id number] - WC PHYS LEVEL 4 - NEW PT 25 ICD-10 Diagnosis Description L97.512 Non-pressure chronic ulcer of other part of right foot with fat layer exposed E11.621 Type 2 diabetes mellitus with foot ulcer M86.9  Osteomyelitis, unspecified N18.5 Chronic kidney disease, stage 5 Quantity: 1 : [id number] - WC PHYS SUBQ TISS 20 SQ CM ICD-10 Diagnosis Description L97.512 Non-pressure chronic ulcer of other part of right foot with fat layer exposed Quantity: 1 Electronic Signature(s) Signed: 10/12/2022 3:08:27 PM By: Fredirick Maudlin MD FACS Previous Signature: 10/12/2022 10:30:34 AM Version By: Fredirick Maudlin MD FACS Previous Signature: 10/12/2022 10:20:19 AM Version By: Fredirick Maudlin MD FACS Entered By: Fredirick Maudlin on 10/12/2022 15:08:26

## 2022-10-17 ENCOUNTER — Telehealth (INDEPENDENT_AMBULATORY_CARE_PROVIDER_SITE_OTHER): Payer: Self-pay

## 2022-10-17 NOTE — Telephone Encounter (Signed)
I spoke with the patient on 10/16/22 and he agreed to being scheduled for a left brachial axillary graft with Dr. Delana Meyer on 10/20/22. Patient has been scheduled and has a pre-op phone call on 10/18/22 between 8-1 pm.  I did go over pre-surgical instructions on 10/16/22 and stated I would call back and give him his pre-op phone call date. I called the patient this morning and had to leave a message for a return call.

## 2022-10-18 ENCOUNTER — Other Ambulatory Visit (INDEPENDENT_AMBULATORY_CARE_PROVIDER_SITE_OTHER): Payer: Self-pay | Admitting: Nurse Practitioner

## 2022-10-18 ENCOUNTER — Encounter
Admission: RE | Admit: 2022-10-18 | Discharge: 2022-10-18 | Disposition: A | Discharge status: Home / Self Care | Payer: No Typology Code available for payment source | Source: Ambulatory Visit | Attending: Vascular Surgery | Admitting: Vascular Surgery

## 2022-10-18 VITALS — Ht 72.0 in | Wt 185.0 lb

## 2022-10-18 DIAGNOSIS — E1142 Type 2 diabetes mellitus with diabetic polyneuropathy: Secondary | ICD-10-CM

## 2022-10-18 DIAGNOSIS — N186 End stage renal disease: Secondary | ICD-10-CM

## 2022-10-18 DIAGNOSIS — Z01818 Encounter for other preprocedural examination: Secondary | ICD-10-CM | POA: Diagnosis not present

## 2022-10-18 DIAGNOSIS — Z01812 Encounter for preprocedural laboratory examination: Secondary | ICD-10-CM

## 2022-10-18 DIAGNOSIS — N185 Chronic kidney disease, stage 5: Secondary | ICD-10-CM

## 2022-10-18 HISTORY — DX: Chronic kidney disease, stage 5: N18.5

## 2022-10-18 HISTORY — DX: Type 2 diabetes mellitus with diabetic chronic kidney disease: E11.22

## 2022-10-18 HISTORY — DX: Osteomyelitis, unspecified: M86.9

## 2022-10-18 LAB — BASIC METABOLIC PANEL
Anion gap: 9 (ref 5–15)
BUN: 35 mg/dL — ABNORMAL HIGH (ref 8–23)
CO2: 23 mmol/L (ref 22–32)
Calcium: 8.9 mg/dL (ref 8.9–10.3)
Chloride: 107 mmol/L (ref 98–111)
Creatinine, Ser: 3.93 mg/dL — ABNORMAL HIGH (ref 0.61–1.24)
GFR, Estimated: 16 mL/min — ABNORMAL LOW (ref >60)
Glucose, Bld: 239 mg/dL — ABNORMAL HIGH (ref 70–99)
Potassium: 5.1 mmol/L (ref 3.5–5.1)
Sodium: 139 mmol/L (ref 135–145)

## 2022-10-18 LAB — TYPE AND SCREEN
ABO/RH(D): O POS
Antibody Screen: NEGATIVE

## 2022-10-18 LAB — CBC WITH DIFFERENTIAL/PLATELET
Abs Immature Granulocytes: 0.05 10*3/uL (ref 0.00–0.07)
Basophils Absolute: 0 10*3/uL (ref 0.0–0.1)
Basophils Relative: 0 %
Eosinophils Absolute: 0.2 10*3/uL (ref 0.0–0.5)
Eosinophils Relative: 2 %
HCT: 35.7 % — ABNORMAL LOW (ref 39.0–52.0)
Hemoglobin: 11.1 g/dL — ABNORMAL LOW (ref 13.0–17.0)
Immature Granulocytes: 1 %
Lymphocytes Relative: 20 %
Lymphs Abs: 1.9 10*3/uL (ref 0.7–4.0)
MCH: 27.4 pg (ref 26.0–34.0)
MCHC: 31.1 g/dL (ref 30.0–36.0)
MCV: 88.1 fL (ref 80.0–100.0)
Monocytes Absolute: 0.9 10*3/uL (ref 0.1–1.0)
Monocytes Relative: 9 %
Neutro Abs: 6.4 10*3/uL (ref 1.7–7.7)
Neutrophils Relative %: 68 %
Platelets: 210 10*3/uL (ref 150–400)
RBC: 4.05 MIL/uL — ABNORMAL LOW (ref 4.22–5.81)
RDW: 16.3 % — ABNORMAL HIGH (ref 11.5–15.5)
WBC: 9.4 10*3/uL (ref 4.0–10.5)
nRBC: 0 % (ref 0.0–0.2)

## 2022-10-18 NOTE — Patient Instructions (Addendum)
Your procedure is scheduled on: Friday, January 19 Report to the Registration Desk on the 1st floor of the [facility]. To find out your arrival time, please call [phone] between 1PM - 3PM on: Thursday, January 18 If your arrival time is 6:00 am, do not arrive prior to that time as the [facility] entrance doors do not open until 6:00 am.  REMEMBER: Instructions that are not followed completely may result in serious medical risk, up to and including death; or upon the discretion of your surgeon and anesthesiologist your surgery may need to be rescheduled.  Do not eat or drink after midnight the night before surgery.  No gum chewing, lozengers or hard candies.  TAKE THESE MEDICATIONS THE MORNING OF SURGERY WITH A SIP OF WATER:  Amlodipine  Do not take trulicity (dulaglutide) insulin for 7 days prior to surgery. Resume on your regular day (Sunday) after surgery. Resume on January 21.  Do not take any insulin on the morning of surgery.  One week prior to surgery: Stop Anti-inflammatories (NSAIDS) such as Advil, Aleve, Ibuprofen, Motrin, Naproxen, Naprosyn and Aspirin based products such as Excedrin, Goodys Powder, BC Powder. Stop ANY OVER THE COUNTER supplements until after surgery. You may however, continue to take Tylenol if needed for pain up until the day of surgery.  No Alcohol for 24 hours before or after surgery.  No Smoking including e-cigarettes for 24 hours prior to surgery.  No chewable tobacco products for at least 6 hours prior to surgery.  No nicotine patches on the day of surgery.  Do not use any "recreational" drugs for at least a week prior to your surgery.  Please be advised that the combination of cocaine and anesthesia may have negative outcomes, up to and including death. If you test positive for cocaine, your surgery will be cancelled.  On the morning of surgery brush your teeth with toothpaste and water, you may rinse your mouth with mouthwash if you  wish. Do not swallow any toothpaste or mouthwash.  Use CHG Soap as directed on instruction sheet.  Do not wear jewelry, make-up, hairpins, clips or nail polish.  Do not wear lotions, powders, or perfumes.   Do not shave body from the neck down 48 hours prior to surgery just in case you cut yourself which could leave a site for infection.  Also, freshly shaved skin may become irritated if using the CHG soap.  Contact lenses, hearing aids and dentures may not be worn into surgery.  Do not bring valuables to the hospital. [facility] is not responsible for any missing/lost belongings or valuables.   Notify your doctor if there is any change in your medical condition (cold, fever, infection).  Wear comfortable clothing (specific to your surgery type) to the hospital.  After surgery, you can help prevent lung complications by doing breathing exercises.  Take deep breaths and cough every 1-2 hours. Your doctor may order a device called an Incentive Spirometer to help you take deep breaths.  If you are being discharged the day of surgery, you will not be allowed to drive home. You will need a responsible adult (18 years or older) to drive you home and stay with you that night.   If you are taking public transportation, you will need to have a responsible adult (18 years or older) with you. Please confirm with your physician that it is acceptable to use public transportation.   Please call the [facility] at [phone] if you  have any questions about these instructions.  Surgery Visitation Policy:  Patients undergoing a surgery or procedure may have two family members or support persons with them as long as the person is not COVID-19 positive or experiencing its symptoms.      Preparing for Surgery with CHLORHEXIDINE GLUCONATE (CHG) Soap  Chlorhexidine Gluconate (CHG) Soap  o An antiseptic cleaner that kills germs and bonds with the skin to continue killing  germs even after washing  o Used for showering the night before surgery and morning of surgery  Before surgery, you can play an important role by reducing the number of germs on your skin.  CHG (Chlorhexidine gluconate) soap is an antiseptic cleanser which kills germs and bonds with the skin to continue killing germs even after washing.  Please do not use if you have an allergy to CHG or antibacterial soaps. If your skin becomes reddened/irritated stop using the CHG.  1. Shower the NIGHT BEFORE SURGERY and the MORNING OF SURGERY with CHG soap.  2. If you choose to wash your hair, wash your hair first as usual with your normal shampoo.  3. After shampooing, rinse your hair and body thoroughly to remove the shampoo.  4. Use CHG as you would any other liquid soap. You can apply CHG directly to the skin and wash gently with a scrungie or a clean washcloth.  5. Apply the CHG soap to your body only from the neck down. Do not use on open wounds or open sores. Avoid contact with your eyes, ears, mouth, and genitals (private parts). Wash face and genitals (private parts) with your normal soap.  6. Wash thoroughly, paying special attention to the area where your surgery will be performed.  7. Thoroughly rinse your body with warm water.  8. Do not shower/wash with your normal soap after using and rinsing off the CHG soap.  9. Pat yourself dry with a clean towel.  10. Wear clean pajamas to bed the night before surgery.  12. Place clean sheets on your bed the night of your first shower and do not sleep with pets.  13. Shower again with the CHG soap on the day of surgery prior to arriving at the hospital.  14. Do not apply any deodorants/lotions/powders.  15. Please wear clean clothes to the hospital.

## 2022-10-19 ENCOUNTER — Encounter (HOSPITAL_BASED_OUTPATIENT_CLINIC_OR_DEPARTMENT_OTHER): Payer: No Typology Code available for payment source | Admitting: General Surgery

## 2022-10-19 ENCOUNTER — Ambulatory Visit (HOSPITAL_COMMUNITY)
Admission: RE | Admit: 2022-10-19 | Discharge: 2022-10-19 | Disposition: A | Discharge status: Home / Self Care | Payer: No Typology Code available for payment source | Source: Ambulatory Visit | Attending: General Surgery | Admitting: General Surgery

## 2022-10-19 DIAGNOSIS — E11621 Type 2 diabetes mellitus with foot ulcer: Secondary | ICD-10-CM | POA: Diagnosis not present

## 2022-10-19 DIAGNOSIS — M869 Osteomyelitis, unspecified: Secondary | ICD-10-CM | POA: Insufficient documentation

## 2022-10-19 DIAGNOSIS — L97514 Non-pressure chronic ulcer of other 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: Secondary | ICD-10-CM | POA: Diagnosis not present

## 2022-10-19 DIAGNOSIS — R053 Chronic cough: Secondary | ICD-10-CM | POA: Diagnosis not present

## 2022-10-19 DIAGNOSIS — R0602 Shortness of breath: Secondary | ICD-10-CM | POA: Diagnosis not present

## 2022-10-19 MED ORDER — FAMOTIDINE 20 MG PO TABS: 20.0000 mg | ORAL_TABLET | Freq: Once | ORAL | Status: AC

## 2022-10-19 MED ORDER — CHLORHEXIDINE GLUCONATE CLOTH 2 % EX PADS: 6.0000 | MEDICATED_PAD | Freq: Once | CUTANEOUS | Status: DC

## 2022-10-19 MED ORDER — ORAL CARE MOUTH RINSE: 15.0000 mL | Freq: Once | OROMUCOSAL | Status: AC

## 2022-10-19 MED ORDER — SODIUM CHLORIDE 0.9 % IV SOLN: INTRAVENOUS | Status: DC

## 2022-10-19 MED ORDER — CHLORHEXIDINE GLUCONATE 0.12 % MT SOLN: 15.0000 mL | Freq: Once | OROMUCOSAL | Status: AC

## 2022-10-19 MED ORDER — CEFAZOLIN SODIUM-DEXTROSE 2-4 GM/100ML-% IV SOLN
2.0000 g | INTRAVENOUS | Status: AC
  Administered 2022-10-20: 2 g via INTRAVENOUS

## 2022-10-19 NOTE — Progress Notes (Signed)
[name], [name] ([id number]) [id number] Page 1 of 7 Visit Report for 10/19/2022 Arrival Information Details Patient Name: Date of Service: [name], [name] 10/19/2022 10:45 A M Medical Record Number: [id number] Patient Account Number: [id number] Date of Birth/Sex: Treating RN: 1951-06-05 (72 y.o. [name], [name] Primary Care [name]: [name] Other Clinician: Referring [name]: Treating [name]/Extender: [name] Weeks in Treatment: 1 Visit Information History Since Last Visit Added or deleted any medications: No Patient Arrived: Ambulatory Any new allergies or adverse reactions: No Arrival Time: 10:53 Had a fall or experienced change in No Accompanied By: spouse activities of daily living that may affect Transfer Assistance: None risk of falls: Patient Identification Verified: Yes Signs or symptoms of abuse/neglect since last visito No Secondary Verification Process Completed: Yes Hospitalized since last visit: No Patient Requires Transmission-Based Precautions: No Implantable device outside of the clinic excluding No Patient Has Alerts: No cellular tissue based products placed in the center since last visit: Has Dressing in Place as Prescribed: Yes Pain Present Now: No Electronic Signature(s) Signed: 10/19/2022 5:28:47 PM By: Baruch Gouty RN, BSN Entered By: Baruch Gouty on 10/19/2022 10:57:37 -------------------------------------------------------------------------------- Encounter Discharge Information Details Patient Name: Date of Service: [name], [name] 10/19/2022 10:45 A M Medical Record Number: [id number] Patient Account Number: [id number] Date of Birth/Sex: Treating RN: Nov 25, 1950 (72 y.o. [name] Primary Care [name]: [name] Other Clinician: Referring [name]: Treating [name]/Extender: [name] Weeks in Treatment: 1 Encounter Discharge Information Items Post  Procedure Vitals Discharge Condition: Stable Temperature (F): 98.2 Ambulatory Status: Ambulatory Pulse (bpm): 87 Discharge Destination: Home Respiratory Rate (breaths/min): 18 Transportation: Private Auto Blood Pressure (mmHg): 159/72 Accompanied By: spouse Schedule Follow-up Appointment: Yes Clinical Summary of Care: Patient Declined Electronic Signature(s) Signed: 10/19/2022 5:28:47 PM By: Baruch Gouty RN, BSN Entered By: Baruch Gouty on 10/19/2022 11:55:53 [name], [name] ([id number]) [id number] Page 2 of 7 -------------------------------------------------------------------------------- Lower Extremity Assessment Details Patient Name: Date of ServiceJodene [name], [name] 10/19/2022 10:45 A M Medical Record Number: [id number] Patient Account Number: [id number] Date of Birth/Sex: Treating RN: 06-05-1951 (72 y.o. [name] Primary Care [name]: [name] Other Clinician: Referring [name]: Treating [name]/Extender: [name] Weeks in Treatment: 1 Edema Assessment Assessed: [Left: No] [Right: No] Edema: [Left: N] [Right: o] Calf Left: Right: Point of Measurement: From Medial Instep 37.5 cm Ankle Left: Right: Point of Measurement: From Medial Instep 23.5 cm Vascular Assessment Pulses: Dorsalis Pedis Palpable: [Right:Yes] Electronic Signature(s) Signed: 10/19/2022 5:28:47 PM By: Baruch Gouty RN, BSN Entered By: Baruch Gouty on 10/19/2022 11:03:35 -------------------------------------------------------------------------------- Multi Wound Chart Details Patient Name: Date of Service: [name], [name] 10/19/2022 10:45 A M Medical Record Number: [id number] Patient Account Number: [id number] Date of Birth/Sex: Treating RN: Feb 07, 1951 (72 y.o. M) Primary Care [name]: [name] Other Clinician: Referring [name]: Treating Tahje Borawski/Extender: [name] Weeks in Treatment: 1 Vital  Signs Height(in): 72 Capillary Blood Glucose(mg/dl): 101 Weight(lbs): 185 Pulse(bpm): 77 Body Mass Index(BMI): 25.1 Blood Pressure(mmHg): 159/72 Temperature(F): 98.2 Respiratory Rate(breaths/min): 18 [1:Photos: No Photos Right, Medial T Great oe Wound Location: Pressure Injury Wounding Event: Diabetic Wound/Ulcer of the Lower Primary Etiology: Extremity Acute Osteomyelitis Secondary Etiology: Cataracts, Hypertension, Type II Comorbid History: Diabetes,  Osteomyelitis, Neuropathy, Confinement Anxiety] [N/A:N/A N/A N/A N/A N/A N/A] [name], [name] ([id number]) [1:08/16/2022 Date Acquired: 1 Weeks of Treatment: Open Wound Status: No Wound Recurrence: 0.8x0.9x0.5 Measurements L x W x D (cm) 0.565 A (cm) : rea 0.283 Volume (cm) : -105.50% % Reduction in A  rea: -157.30% % Reduction in Volume: 3 Starting Position  1 (o'clock): 7 Ending Position 1 (o'clock): 0.2 Maximum Distance 1 (cm): Yes Undermining: Grade 3 Classification: Medium Exudate A mount: Serosanguineous Exudate Type: red, brown Exudate Color: Distinct, outline attached Wound Margin: Medium (34-66%)  Granulation A mount: Red Granulation Quality: Medium (34-66%) Necrotic A mount: Fat Layer (Subcutaneous Tissue): Yes N/A Exposed Structures: Tendon: Yes Fascia: No Muscle: No Joint: No Bone: No Small (1-33%) Epithelialization: Debridement -  Selective/Open Wound N/A Debridement: Pre-procedure Verification/Time Out 11:25 Taken: Lidocaine 4% T opical Solution Pain Control: Callus, Slough Tissue Debrided: Skin/Epidermis Level: 0.72 Debridement A (sq cm): rea Curette Instrument: Minimum  Bleeding: Pressure Hemostasis A chieved: 0 Procedural Pain: 0 Post Procedural Pain: Procedure was tolerated well Debridement Treatment Response: 0.8x0.9x0.5 Post Debridement Measurements L x W x D (cm) 0.283 Post Debridement Volume: (cm) Callus: Yes  Periwound Skin Texture: No Abnormalities Noted Periwound Skin Moisture: No Abnormalities Noted Periwound Skin Color:  No Abnormality Temperature: Debridement Procedures Performed:] [N/A:N/A N/A N/A N/A N/A N/A N/A N/A N/A N/A N/A N/A N/A N/A N/A N/A N/A  N/A N/A N/A N/A N/A N/A N/A N/A N/A N/A N/A N/A N/A N/A 37 N/A N/A N/A N/A N/A N/A] Treatment Notes Electronic Signature(s) Signed: 10/19/2022 11:45:23 AM By: Fredirick Maudlin MD FACS Entered By: Fredirick Maudlin on 10/19/2022 11:45:23 -------------------------------------------------------------------------------- Multi-Disciplinary Care Plan Details Patient Name: Date of Service: [name], [name] 10/19/2022 10:45 A M Medical Record Number: [id number] Patient Account Number: [id number] Date of Birth/Sex: Treating RN: 12-Mar-1951 (72 y.o. [name] Primary Care Gwenetta Devos: [name] Other Clinician: Referring Madalene Mickler: Treating Amna Welker/Extender: [name] Weeks in Treatment: 1 [place] reviewed with physician Active Inactive Nutrition [name], [name] ([id number]) [id number] Page 4 of 7 Nursing Diagnoses: Impaired glucose control: actual or potential Goals: Patient/caregiver will maintain therapeutic glucose control Date Initiated: 10/12/2022 Target Resolution Date: 12/28/2022 Goal Status: Active Interventions: Provide education on elevated blood sugars and impact on wound healing Provide education on nutrition Treatment Activities: Dietary management education, guidance and counseling : 10/12/2022 Giving encouragement to exercise : 10/12/2022 Notes: Osteomyelitis Nursing Diagnoses: Infection: osteomyelitis Goals: Patient/caregiver will verbalize understanding of disease process and disease management Date Initiated: 10/12/2022 Target Resolution Date: 11/29/2022 Goal Status: Active Interventions: Assess for signs and symptoms of osteomyelitis resolution every visit Provide education on osteomyelitis Treatment Activities: MRI : 08/16/2022 Notes: Wound/Skin  Impairment Nursing Diagnoses: Impaired tissue integrity Goals: Ulcer/skin breakdown will have a volume reduction of 30% by week 4 Date Initiated: 10/12/2022 Target Resolution Date: 11/30/2022 Goal Status: Active Interventions: Assess ulceration(s) every visit Provide education on smoking Treatment Activities: Skin care regimen initiated : 10/12/2022 Smoking cessation education : 10/12/2022 Notes: Electronic Signature(s) Signed: 10/19/2022 5:28:47 PM By: Baruch Gouty RN, BSN Entered By: Baruch Gouty on 10/19/2022 11:08:04 -------------------------------------------------------------------------------- Pain Assessment Details Patient Name: Date of Service: [name], [name] 10/19/2022 10:45 A M Medical Record Number: [id number] Patient Account Number: [id number] Date of Birth/Sex: Treating RN: December 22, 1950 (72 y.o. [name] Primary Care Moss Berry: [name] Other Clinician: Referring Shabazz Mckey: Treating Chaka Jefferys/Extender: [name] Weeks in Treatment: 1 [name], [name] ([id number]) [id number] Page 5 of 7 Active Problems Location of Pain Severity and Description of Pain Patient Has Paino No Site Locations Rate the pain. Current Pain Level: 0 Pain Management and Medication Current Pain Management: Electronic Signature(s) Signed: 10/19/2022 5:28:47 PM By: Baruch Gouty RN, BSN Entered By: Baruch Gouty on 10/19/2022 10:58:23 -------------------------------------------------------------------------------- Patient/Caregiver Education Details Patient Name: Date of  Service: [name], [name] 1/18/2024andnbsp10:45 A M Medical Record Number: [id number] Patient Account Number: [id number] Date of Birth/Gender: Treating RN: [birth year] (72 y.o. [name] Primary Care Physician: [name] Other Clinician: Referring Physician: Treating Physician/Extender: Levin Erp in Treatment: 1 Education  Assessment Education Provided To: Patient Education Topics Provided Hyperbaric Oxygenation: Methods: Explain/Verbal Responses: Reinforcements needed, State content correctly Offloading: Methods: Explain/Verbal Responses: Reinforcements needed, State content correctly Wound/Skin Impairment: Methods: Explain/Verbal Responses: Reinforcements needed, State content correctly Electronic Signature(s) Signed: 10/19/2022 5:28:47 PM By: Baruch Gouty RN, BSN Entered By: Baruch Gouty on 10/19/2022 [name], [name] ([id number]) [id number] Page 6 of 7 -------------------------------------------------------------------------------- Wound Assessment Details Patient Name: Date of Service: [name], [name] 10/19/2022 10:45 A M Medical Record Number: [id number] Patient Account Number: [id number] Date of Birth/Sex: Treating RN: November 23, 1950 (72 y.o. [name] Primary Care Bently Morath: [name] Other Clinician: Referring Natane Heward: Treating Ardis Lawley/Extender: [name] Weeks in Treatment: 1 Wound Status Wound Number: 1 Primary Diabetic Wound/Ulcer of the Lower Extremity Etiology: Wound Location: Right, Medial T Great oe Secondary Acute Osteomyelitis Wounding Event: Pressure Injury Etiology: Date Acquired: 08/16/2022 Wound Open Weeks Of Treatment: 1 Status: Clustered Wound: No Comorbid Cataracts, Hypertension, Type II Diabetes, Osteomyelitis, History: Neuropathy, Confinement Anxiety Photos Wound Measurements Length: (cm) 0.8 Width: (cm) 0.9 Depth: (cm) 0.5 Area: (cm) 0.565 Volume: (cm) 0.283 % Reduction in Area: -105.5% % Reduction in Volume: -157.3% Epithelialization: Small (1-33%) Tunneling: No Undermining: Yes Starting Position (o'clock): 3 Ending Position (o'clock): 7 Maximum Distance: (cm) 0.2 Wound Description Classification: Grade 3 Wound Margin: Distinct, outline attached Exudate Amount: Medium Exudate  Type: Serosanguineous Exudate Color: red, brown Foul Odor After Cleansing: No Slough/Fibrino Yes Wound Bed Granulation Amount: Medium (34-66%) Exposed Structure Granulation Quality: Red Fascia Exposed: No Necrotic Amount: Medium (34-66%) Fat Layer (Subcutaneous Tissue) Exposed: Yes Necrotic Quality: Adherent Slough Tendon Exposed: Yes Muscle Exposed: No Joint Exposed: No Bone Exposed: No Periwound Skin Texture Texture Color No Abnormalities Noted: No No Abnormalities Noted: Yes Callus: Yes Temperature / Pain Temperature: No Abnormality Moisture No Abnormalities Noted: Yes [name], [name] ([id number]) [id number] Page 7 of 7 Treatment Notes Wound #1 (Toe Great) Wound Laterality: Right, Medial Cleanser Soap and Water Discharge Instruction: May shower and wash wound with dial antibacterial soap and water prior to dressing change. Peri-Wound Care Sween Lotion (Moisturizing lotion) Discharge Instruction: Apply moisturizing lotion as directed Topical Primary Dressing Sorbalgon AG Dressing, 4x4 (in/in) Discharge Instruction: Apply to wound bed as instructed Secondary Dressing Optifoam Non-Adhesive Dressing, 4x4 in Discharge Instruction: Apply over primary dressing as directed. Woven Gauze Sponges 2x2 in Discharge Instruction: Apply over primary dressing as directed. Secured With Conforming Stretch Gauze Bandage, Sterile 2x75 (in/in) Discharge Instruction: Secure with stretch gauze as directed. Compression Wrap Compression Stockings Add-Ons Electronic Signature(s) Signed: 10/19/2022 5:28:47 PM By: Baruch Gouty RN, BSN Entered By: Baruch Gouty on 10/19/2022 12:03:10 -------------------------------------------------------------------------------- Vitals Details Patient Name: Date of Service: [name], [name] 10/19/2022 10:45 A M Medical Record Number: [id number] Patient Account Number: [id number] Date of Birth/Sex: Treating RN: 1951/04/25 (72  y.o. [name] Primary Care Josha Weekley: [name] Other Clinician: Referring Torri Michalski: Treating Shoshanna Mcquitty/Extender: [name] Weeks in Treatment: 1 Vital Signs Time Taken: 10:57 Temperature (F): 98.2 Height (in): 72 Pulse (bpm): 87 Weight (lbs): 185 Respiratory Rate (breaths/min): 18 Body Mass Index (BMI): 25.1 Blood Pressure (mmHg): 159/72 Capillary Blood Glucose (mg/dl): 101 Reference Range: 80 - 120 mg / dl Electronic Signature(s) Signed: 10/19/2022 5:28:47 PM By:  Baruch Gouty RN, BSN Entered By: Baruch Gouty on 10/19/2022 10:58:11

## 2022-10-20 ENCOUNTER — Encounter: Payer: Self-pay | Admitting: Vascular Surgery

## 2022-10-20 ENCOUNTER — Ambulatory Visit
Admission: RE | Admit: 2022-10-20 | Discharge: 2022-10-20 | Disposition: A | Discharge status: Home / Self Care | Payer: No Typology Code available for payment source | Source: Ambulatory Visit | Attending: Vascular Surgery | Admitting: Vascular Surgery

## 2022-10-20 ENCOUNTER — Ambulatory Visit: Payer: No Typology Code available for payment source | Admitting: Urgent Care

## 2022-10-20 ENCOUNTER — Other Ambulatory Visit: Payer: Self-pay

## 2022-10-20 ENCOUNTER — Ambulatory Visit: Payer: No Typology Code available for payment source | Admitting: Registered Nurse

## 2022-10-20 ENCOUNTER — Encounter
Admission: RE | Disposition: A | Discharge status: Home / Self Care | Payer: Self-pay | Source: Ambulatory Visit | Attending: Vascular Surgery

## 2022-10-20 DIAGNOSIS — K219 Gastro-esophageal reflux disease without esophagitis: Secondary | ICD-10-CM | POA: Diagnosis not present

## 2022-10-20 DIAGNOSIS — E1122 Type 2 diabetes mellitus with diabetic chronic kidney disease: Secondary | ICD-10-CM | POA: Diagnosis not present

## 2022-10-20 DIAGNOSIS — Z8673 Personal history of transient ischemic attack (TIA), and cerebral infarction without residual deficits: Secondary | ICD-10-CM | POA: Insufficient documentation

## 2022-10-20 DIAGNOSIS — I120 Hypertensive chronic kidney disease with stage 5 chronic kidney disease or end stage renal disease: Secondary | ICD-10-CM | POA: Insufficient documentation

## 2022-10-20 DIAGNOSIS — Z794 Long term (current) use of insulin: Secondary | ICD-10-CM | POA: Insufficient documentation

## 2022-10-20 DIAGNOSIS — N185 Chronic kidney disease, stage 5: Secondary | ICD-10-CM

## 2022-10-20 DIAGNOSIS — N2581 Secondary hyperparathyroidism of renal origin: Secondary | ICD-10-CM | POA: Diagnosis not present

## 2022-10-20 DIAGNOSIS — E1142 Type 2 diabetes mellitus with diabetic polyneuropathy: Secondary | ICD-10-CM | POA: Diagnosis not present

## 2022-10-20 DIAGNOSIS — E785 Hyperlipidemia, unspecified: Secondary | ICD-10-CM | POA: Insufficient documentation

## 2022-10-20 DIAGNOSIS — Z01812 Encounter for preprocedural laboratory examination: Secondary | ICD-10-CM

## 2022-10-20 DIAGNOSIS — N186 End stage renal disease: Secondary | ICD-10-CM | POA: Insufficient documentation

## 2022-10-20 DIAGNOSIS — Z87891 Personal history of nicotine dependence: Secondary | ICD-10-CM | POA: Insufficient documentation

## 2022-10-20 HISTORY — PX: AV FISTULA PLACEMENT: SHX1204

## 2022-10-20 LAB — POCT I-STAT, CHEM 8
BUN: 35 mg/dL — ABNORMAL HIGH (ref 8–23)
Calcium, Ion: 1.24 mmol/L (ref 1.15–1.40)
Chloride: 114 mmol/L — ABNORMAL HIGH (ref 98–111)
Creatinine, Ser: 4.8 mg/dL — ABNORMAL HIGH (ref 0.61–1.24)
Glucose, Bld: 160 mg/dL — ABNORMAL HIGH (ref 70–99)
HCT: 34 % — ABNORMAL LOW (ref 39.0–52.0)
Hemoglobin: 11.6 g/dL — ABNORMAL LOW (ref 13.0–17.0)
Potassium: 4.9 mmol/L (ref 3.5–5.1)
Sodium: 144 mmol/L (ref 135–145)
TCO2: 23 mmol/L (ref 22–32)

## 2022-10-20 LAB — GLUCOSE, CAPILLARY: Glucose-Capillary: 166 mg/dL — ABNORMAL HIGH (ref 70–99)

## 2022-10-20 LAB — ABO/RH: ABO/RH(D): O POS

## 2022-10-20 SURGERY — INSERTION OF ARTERIOVENOUS (AV) GORE-TEX GRAFT ARM
Anesthesia: General | Laterality: Left

## 2022-10-20 MED ORDER — OXYCODONE HCL 5 MG PO TABS: 5.0000 mg | ORAL_TABLET | Freq: Once | ORAL | Status: DC | PRN

## 2022-10-20 MED ORDER — HEMOSTATIC AGENTS (NO CHARGE) OPTIME
TOPICAL | Status: DC | PRN
  Administered 2022-10-20: 1 via TOPICAL

## 2022-10-20 MED ORDER — PROPOFOL 10 MG/ML IV BOLUS
INTRAVENOUS | Status: DC | PRN
  Administered 2022-10-20: 160 mg via INTRAVENOUS
  Administered 2022-10-20: 40 mg via INTRAVENOUS
  Administered 2022-10-20: 30 mg via INTRAVENOUS

## 2022-10-20 MED ORDER — SODIUM CHLORIDE 0.9 % IV SOLN: INTRAVENOUS | Status: DC | PRN

## 2022-10-20 MED ORDER — FENTANYL CITRATE (PF) 100 MCG/2ML IJ SOLN
INTRAMUSCULAR | Status: AC
  Filled 2022-10-20: qty 2

## 2022-10-20 MED ORDER — HYDROCODONE-ACETAMINOPHEN 5-325 MG PO TABS: 1.0000 | ORAL_TABLET | Freq: Four times a day (QID) | ORAL | 0 refills | Status: DC | PRN

## 2022-10-20 MED ORDER — OXYCODONE HCL 5 MG/5ML PO SOLN: 5.0000 mg | Freq: Once | ORAL | Status: DC | PRN

## 2022-10-20 MED ORDER — ACETAMINOPHEN 10 MG/ML IV SOLN
INTRAVENOUS | Status: AC
  Filled 2022-10-20: qty 100

## 2022-10-20 MED ORDER — BUPIVACAINE HCL (PF) 0.5 % IJ SOLN
INTRAMUSCULAR | Status: AC
  Filled 2022-10-20: qty 30

## 2022-10-20 MED ORDER — LIDOCAINE HCL (PF) 2 % IJ SOLN
INTRAMUSCULAR | Status: AC
  Filled 2022-10-20: qty 5

## 2022-10-20 MED ORDER — SODIUM CHLORIDE 0.9 % IV SOLN
INTRAVENOUS | Status: DC | PRN
  Administered 2022-10-20: 501 mL via SURGICAL_CAVITY

## 2022-10-20 MED ORDER — BUPIVACAINE LIPOSOME 1.3 % IJ SUSP
INTRAMUSCULAR | Status: AC
  Filled 2022-10-20: qty 20

## 2022-10-20 MED ORDER — DEXAMETHASONE SODIUM PHOSPHATE 10 MG/ML IJ SOLN
INTRAMUSCULAR | Status: AC
  Filled 2022-10-20: qty 1

## 2022-10-20 MED ORDER — DEXAMETHASONE SODIUM PHOSPHATE 10 MG/ML IJ SOLN
INTRAMUSCULAR | Status: DC | PRN
  Administered 2022-10-20: 5 mg via INTRAVENOUS

## 2022-10-20 MED ORDER — FAMOTIDINE 20 MG PO TABS
ORAL_TABLET | ORAL | Status: AC
  Administered 2022-10-20: 20 mg via ORAL
  Filled 2022-10-20: qty 1

## 2022-10-20 MED ORDER — EPHEDRINE 5 MG/ML INJ
INTRAVENOUS | Status: AC
  Filled 2022-10-20: qty 5

## 2022-10-20 MED ORDER — HYDROMORPHONE HCL 1 MG/ML IJ SOLN: 1.0000 mg | Freq: Once | INTRAMUSCULAR | Status: DC | PRN

## 2022-10-20 MED ORDER — HEPARIN SODIUM (PORCINE) 5000 UNIT/ML IJ SOLN
INTRAMUSCULAR | Status: AC
  Filled 2022-10-20: qty 1

## 2022-10-20 MED ORDER — ONDANSETRON HCL 4 MG/2ML IJ SOLN
INTRAMUSCULAR | Status: DC | PRN
  Administered 2022-10-20: 4 mg via INTRAVENOUS

## 2022-10-20 MED ORDER — FENTANYL CITRATE (PF) 100 MCG/2ML IJ SOLN
INTRAMUSCULAR | Status: DC | PRN
  Administered 2022-10-20 (×2): 50 ug via INTRAVENOUS

## 2022-10-20 MED ORDER — CEFAZOLIN SODIUM-DEXTROSE 2-4 GM/100ML-% IV SOLN
INTRAVENOUS | Status: AC
  Filled 2022-10-20: qty 100

## 2022-10-20 MED ORDER — PROPOFOL 10 MG/ML IV BOLUS
INTRAVENOUS | Status: AC
  Filled 2022-10-20: qty 20

## 2022-10-20 MED ORDER — ONDANSETRON HCL 4 MG/2ML IJ SOLN: 4.0000 mg | Freq: Once | INTRAMUSCULAR | Status: DC | PRN

## 2022-10-20 MED ORDER — PHENYLEPHRINE HCL (PRESSORS) 10 MG/ML IV SOLN
INTRAVENOUS | Status: DC | PRN
  Administered 2022-10-20 (×4): 160 ug via INTRAVENOUS
  Administered 2022-10-20: 80 ug via INTRAVENOUS
  Administered 2022-10-20 (×5): 160 ug via INTRAVENOUS

## 2022-10-20 MED ORDER — CHLORHEXIDINE GLUCONATE 0.12 % MT SOLN
OROMUCOSAL | Status: AC
  Administered 2022-10-20: 15 mL via OROMUCOSAL
  Filled 2022-10-20: qty 15

## 2022-10-20 MED ORDER — BUPIVACAINE LIPOSOME 1.3 % IJ SUSP
INTRAMUSCULAR | Status: DC | PRN
  Administered 2022-10-20: 50 mL

## 2022-10-20 MED ORDER — ACETAMINOPHEN 10 MG/ML IV SOLN: 1000.0000 mg | Freq: Once | INTRAVENOUS | Status: DC | PRN

## 2022-10-20 MED ORDER — LIDOCAINE HCL (CARDIAC) PF 100 MG/5ML IV SOSY
PREFILLED_SYRINGE | INTRAVENOUS | Status: DC | PRN
  Administered 2022-10-20: 60 mg via INTRAVENOUS

## 2022-10-20 MED ORDER — EPHEDRINE SULFATE (PRESSORS) 50 MG/ML IJ SOLN
INTRAMUSCULAR | Status: DC | PRN
  Administered 2022-10-20 (×2): 10 mg via INTRAVENOUS
  Administered 2022-10-20: 5 mg via INTRAVENOUS
  Administered 2022-10-20: 10 mg via INTRAVENOUS

## 2022-10-20 MED ORDER — ONDANSETRON HCL 4 MG/2ML IJ SOLN
INTRAMUSCULAR | Status: AC
  Filled 2022-10-20: qty 2

## 2022-10-20 MED ORDER — FENTANYL CITRATE (PF) 100 MCG/2ML IJ SOLN: 25.0000 ug | INTRAMUSCULAR | Status: DC | PRN

## 2022-10-20 MED ORDER — ONDANSETRON HCL 4 MG/2ML IJ SOLN: 4.0000 mg | Freq: Four times a day (QID) | INTRAMUSCULAR | Status: DC | PRN

## 2022-10-20 MED ORDER — ACETAMINOPHEN 10 MG/ML IV SOLN
INTRAVENOUS | Status: DC | PRN
  Administered 2022-10-20: 1000 mg via INTRAVENOUS

## 2022-10-20 MED ORDER — PHENYLEPHRINE 80 MCG/ML (10ML) SYRINGE FOR IV PUSH (FOR BLOOD PRESSURE SUPPORT)
PREFILLED_SYRINGE | INTRAVENOUS | Status: AC
  Filled 2022-10-20: qty 10

## 2022-10-20 SURGICAL SUPPLY — 59 items
ADH SKN CLS APL DERMABOND .7 (GAUZE/BANDAGES/DRESSINGS) ×2
APL PRP STRL LF DISP 70% ISPRP (MISCELLANEOUS) ×1
BAG DECANTER FOR FLEXI CONT (MISCELLANEOUS) ×1 IMPLANT
BLADE SURG SZ11 CARB STEEL (BLADE) ×1 IMPLANT
BOOT SUTURE AID YELLOW STND (SUTURE) ×1 IMPLANT
BRUSH SCRUB EZ  4% CHG (MISCELLANEOUS) ×1
BRUSH SCRUB EZ 4% CHG (MISCELLANEOUS) ×1 IMPLANT
CHLORAPREP W/TINT 26 (MISCELLANEOUS) ×1 IMPLANT
DERMABOND ADVANCED .7 DNX12 (GAUZE/BANDAGES/DRESSINGS) IMPLANT
DRESSING SURGICEL FIBRLLR 1X2 (HEMOSTASIS) ×1 IMPLANT
DRSG SURGICEL FIBRILLAR 1X2 (HEMOSTASIS) ×1
DRSG TEGADERM 6X8 (GAUZE/BANDAGES/DRESSINGS) IMPLANT
ELECT CAUTERY BLADE 6.4 (BLADE) ×1 IMPLANT
ELECT REM PT RETURN 9FT ADLT (ELECTROSURGICAL) ×1
ELECTRODE REM PT RTRN 9FT ADLT (ELECTROSURGICAL) ×1 IMPLANT
GLOVE BIO SURGEON STRL SZ7 (GLOVE) ×1 IMPLANT
GLOVE SURG SYN 8.0 (GLOVE) ×1 IMPLANT
GLOVE SURG SYN 8.0 PF PI (GLOVE) ×1 IMPLANT
GOWN STRL REUS W/ TWL LRG LVL3 (GOWN DISPOSABLE) ×2 IMPLANT
GOWN STRL REUS W/ TWL XL LVL3 (GOWN DISPOSABLE) ×1 IMPLANT
GOWN STRL REUS W/TWL LRG LVL3 (GOWN DISPOSABLE) ×2
GOWN STRL REUS W/TWL XL LVL3 (GOWN DISPOSABLE) ×1
GRAFT PROPATEN STD WALL 4 7X45 (Vascular Products) ×1 IMPLANT
IV NS 500ML (IV SOLUTION) ×1
IV NS 500ML BAXH (IV SOLUTION) ×1 IMPLANT
KIT TURNOVER KIT A (KITS) ×1 IMPLANT
LABEL OR SOLS (LABEL) ×1 IMPLANT
LOOP RED MAXI  1X406MM (MISCELLANEOUS) ×2
LOOP VESSEL MAXI 1X406 RED (MISCELLANEOUS) ×1 IMPLANT
LOOP VESSEL MINI 0.8X406 BLUE (MISCELLANEOUS) ×2 IMPLANT
LOOPS BLUE MINI 0.8X406MM (MISCELLANEOUS) ×2
MANIFOLD NEPTUNE II (INSTRUMENTS) ×1 IMPLANT
NDL FILTER BLUNT 18X1 1/2 (NEEDLE) ×1 IMPLANT
NEEDLE FILTER BLUNT 18X1 1/2 (NEEDLE) ×1 IMPLANT
NS IRRIG 500ML POUR BTL (IV SOLUTION) ×1 IMPLANT
PACK EXTREMITY ARMC (MISCELLANEOUS) ×1 IMPLANT
PAD PREP 24X41 OB/GYN DISP (PERSONAL CARE ITEMS) ×1 IMPLANT
STOCKINETTE 48X4 2 PLY STRL (GAUZE/BANDAGES/DRESSINGS) ×1 IMPLANT
STOCKINETTE STRL 4IN 9604848 (GAUZE/BANDAGES/DRESSINGS) ×1 IMPLANT
SUT GTX CV-6 30 (SUTURE) ×2 IMPLANT
SUT MNCRL 4-0 (SUTURE) ×1
SUT MNCRL 4-0 27XMFL (SUTURE) ×1
SUT MNCRL+ 5-0 UNDYED PC-3 (SUTURE) ×1 IMPLANT
SUT MONOCRYL 5-0 (SUTURE) ×1
SUT PROLENE 6 0 BV (SUTURE) ×2 IMPLANT
SUT SILK 2 0 (SUTURE) ×1
SUT SILK 2 0 SH (SUTURE) ×1 IMPLANT
SUT SILK 2-0 18XBRD TIE 12 (SUTURE) ×1 IMPLANT
SUT SILK 3 0 (SUTURE) ×1
SUT SILK 3-0 18XBRD TIE 12 (SUTURE) ×1 IMPLANT
SUT SILK 4 0 (SUTURE) ×1
SUT SILK 4-0 18XBRD TIE 12 (SUTURE) ×1 IMPLANT
SUT VIC AB 3-0 SH 27 (SUTURE) ×2
SUT VIC AB 3-0 SH 27X BRD (SUTURE) ×2 IMPLANT
SUTURE MNCRL 4-0 27XMF (SUTURE) IMPLANT
SYR 20ML LL LF (SYRINGE) ×1 IMPLANT
SYR 3ML LL SCALE MARK (SYRINGE) ×1 IMPLANT
TRAP FLUID SMOKE EVACUATOR (MISCELLANEOUS) ×1 IMPLANT
WATER STERILE IRR 500ML POUR (IV SOLUTION) ×1 IMPLANT

## 2022-10-20 NOTE — Progress Notes (Signed)
MRN : [id number]  [name] is a 72 y.o. (03-Sep-1951) male who presents with chief complaint of check access.  History of Present Illness:   The patient presents to [facility] for creation of AV access.  The patient has chronic renal insufficiency stage V secondary to hypertension. The patient's most recent creatinine clearance is less than 20. The patient volume status has not yet become an issue. Patient's blood pressures been relatively well controlled. There are mild uremic symptoms which appear to be relatively well tolerated at this time.   The patient notes the kidney problem has been present for a long time and has been progressively getting worse.   The patient is followed by nephrology.     The patient is right-handed.   The patient has been considering the various methods of dialysis and wishes to proceed with hemodialysis and therefore creation of AV access.   No recent shortening of the patient's walking distance or new symptoms consistent with claudication.  No history of rest pain symptoms. No new ulcers or wounds of the lower extremities have occurred.   The patient denies amaurosis fugax or recent TIA symptoms. There are no recent neurological changes noted. There is no history of DVT, PE or superficial thrombophlebitis. No recent episodes of angina or shortness of breath documented.    Patient has adequate access for a left brachial axillary AV graft insertion.  Current Meds  Medication Sig   amLODipine (NORVASC) 10 MG tablet Take 10 mg by mouth daily in the afternoon.   atorvastatin (LIPITOR) 80 MG tablet Take 80 mg by mouth daily in the afternoon.   calcitRIOL (ROCALTROL) 0.25 MCG capsule Take 0.25 mcg by mouth daily in the afternoon.   NOVOLOG MIX 70/30 FLEXPEN (70-30) 100 UNIT/ML FlexPen Inject 20 Units into the skin 2 (two) times daily with a meal.    Past Medical History:  Diagnosis Date   Anemia    Chronic kidney disease, stage  5 (HCC)    Controlled type 2 diabetes mellitus with chronic kidney disease ([place])    Diabetic neuropathy ([place])    Diabetic ulcer of foot associated with diabetes mellitus due to underlying condition, limited to breakdown of skin ([place])    left   GERD (gastroesophageal reflux disease)    Hyperlipidemia    Hypertension    Non compliance w medication regimen    Osteomyelitis ([place]) 08/2022   great toe of right foot   Osteomyelitis of left foot ([place])    Secondary hyperparathyroidism of renal origin [facility])    per nephrology lov dr Candiss Norse 05-22-2022   Stroke [facility]) 2016   no deficits    Past Surgical History:  Procedure Laterality Date   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   left big toe, second toe   APPENDECTOMY     BIOPSY  08/10/2020   Procedure: BIOPSY;  Surgeon: Harvel Quale, MD;  [facility];  Service: Gastroenterology;;   CHOLECYSTECTOMY     COLON SURGERY     blockage   COLONOSCOPY WITH PROPOFOL N/A 08/10/2020   Procedure: COLONOSCOPY WITH PROPOFOL;  Surgeon: Harvel Quale, MD;  [facility];  Service: Gastroenterology;  Laterality: N/A;  706   GRAFT APPLICATION Left 23/76/2831   Procedure: GRAFT APPLICATION;  Surgeon: [name], DPM;  Location: [place];  Service: Podiatry;  Laterality:  Left;   HERNIA REPAIR     I & D EXTREMITY Left 10/10/2021   Procedure: IRRIGATION AND DEBRIDEMENT left foot;  Surgeon: Evelina Bucy, DPM;  Location: [place];  Service: Podiatry;  Laterality: Left;   I & D EXTREMITY Right 08/19/2022   Procedure: IRRIGATION AND DEBRIDEMENT RIGHT GREAT TOE GRAFT APPLICATION WITH BIOPSY;  Surgeon: Criselda Peaches, DPM;  Location: [place];  Service: Podiatry;  Laterality: Right;   IRRIGATION AND DEBRIDEMENT FOOT Left 10/16/2021   Procedure: IRRIGATION AND DEBRIDEMENT FOOT;  Surgeon: [name], DPM;  Location: [place];  Service: Podiatry;  Laterality: Left;  Abscess left heel   METATARSAL HEAD  EXCISION Left 12/08/2019   Procedure: METATARSAL HEAD RECESSION THIRD LEFT;  Surgeon: [name], DPM;  Location: [place];  Service: Podiatry;  Laterality: Left;   POLYPECTOMY  08/10/2020   Procedure: POLYPECTOMY;  Surgeon: Harvel Quale, MD;  [facility];  Service: Gastroenterology;;   WOUND DEBRIDEMENT Left 12/08/2019   Procedure: DEBRIDEMENT WOUND;  Surgeon: [name], DPM;  Location: [place];  Service: Podiatry;  Laterality: Left;   WOUND DEBRIDEMENT Left 10/12/2021   Procedure: LEFT FOOT WOUND DEBRIDEMENT AND IRRIGATION, 1ST METATARSAL RESECTION;  Surgeon: Evelina Bucy, DPM;  Location: [place];  Service: Podiatry;  Laterality: Left;    Social History Social History   Tobacco Use   Smoking status: Former   Smokeless tobacco: Never  Scientific laboratory technician Use: Never used  Substance Use Topics   Alcohol use: Not Currently   Drug use: Never    Family History History reviewed. No pertinent family history.  Allergies  Allergen Reactions   Losartan Potassium Other (See Comments)    hyperkalemia   Sildenafil Other (See Comments)    Not effective. "Did not like the way it made me feel"     REVIEW OF SYSTEMS (Negative unless checked)  Constitutional: '[]'$ Weight loss  '[]'$ Fever  '[]'$ Chills Cardiac: '[]'$ Chest pain   '[]'$ Chest pressure   '[]'$ Palpitations   '[]'$ Shortness of breath when laying flat   '[]'$ Shortness of breath with exertion. Vascular:  '[]'$ Pain in legs with walking   '[]'$ Pain in legs at rest  '[]'$ History of DVT   '[]'$ Phlebitis   '[]'$ Swelling in legs   '[]'$ Varicose veins   '[]'$ Non-healing ulcers Pulmonary:   '[]'$ Uses home oxygen   '[]'$ Productive cough   '[]'$ Hemoptysis   '[]'$ Wheeze  '[]'$ COPD   '[]'$ Asthma Neurologic:  '[]'$ Dizziness   '[]'$ Seizures   '[]'$ History of stroke   '[]'$ History of TIA  '[]'$ Aphasia   '[]'$ Vissual changes   '[]'$ Weakness or numbness in arm   '[]'$ Weakness or numbness in leg Musculoskeletal:   '[]'$ Joint swelling   '[]'$ Joint pain   '[]'$ Low back  pain Hematologic:  '[]'$ Easy bruising  '[]'$ Easy bleeding   '[]'$ Hypercoagulable state   '[]'$ Anemic Gastrointestinal:  '[]'$ Diarrhea   '[]'$ Vomiting  '[]'$ Gastroesophageal reflux/heartburn   '[]'$ Difficulty swallowing. Genitourinary:  '[x]'$ Chronic kidney disease   '[]'$ Difficult urination  '[]'$ Frequent urination   '[]'$ Blood in urine Skin:  '[]'$ Rashes   '[]'$ Ulcers  Psychological:  '[]'$ History of anxiety   '[]'$  History of major depression.  Physical Examination  Vitals:   10/20/22 0645  BP: (!) 161/71  Pulse: 98  Resp: 18  Temp: 98.3 F (36.8 C)  TempSrc: Temporal  SpO2: 100%  Weight: 83.9 kg  Height: 6' (1.829 m)   Body mass index is 25.09 kg/m. Gen: WD/WN, NAD Head: [place]/AT, No temporalis wasting.  Ear/Nose/Throat: Hearing grossly intact, nares w/o erythema or drainage Eyes: PER,  EOMI, sclera nonicteric.  Neck: Supple, no gross masses or lesions.  No JVD.  Pulmonary:  Good air movement, no audible wheezing, no use of accessory muscles.  Cardiac: RRR, precordium non-hyperdynamic. Vascular:    Vessel Right Left  Radial Palpable Palpable  Brachial Palpable Palpable  Gastrointestinal: soft, non-distended. No guarding/no peritoneal signs.  Musculoskeletal: M/S 5/5 throughout.  No deformity.  Neurologic: CN 2-12 intact. Pain and light touch intact in extremities.  Symmetrical.  Speech is fluent. Motor exam as listed above. Psychiatric: Judgment intact, Mood & affect appropriate for pt's clinical situation. Dermatologic: No rashes or ulcers noted.  No changes consistent with cellulitis.   CBC Lab Results  Component Value Date   WBC 9.4 10/18/2022   HGB 11.6 (L) 10/20/2022   HCT 34.0 (L) 10/20/2022   MCV 88.1 10/18/2022   PLT 210 10/18/2022    BMET    Component Value Date/Time   NA 144 10/20/2022 0643   K 4.9 10/20/2022 0643   CL 114 (H) 10/20/2022 0643   CO2 23 10/18/2022 1354   GLUCOSE 160 (H) 10/20/2022 0643   BUN 35 (H) 10/20/2022 0643   CREATININE 4.80 (H) 10/20/2022 0643   CREATININE 4.39 (H)  10/05/2022 0946   CALCIUM 8.9 10/18/2022 1354   GFRNONAA 16 (L) 10/18/2022 1354   Estimated Creatinine Clearance: 15.9 mL/min (A) (by C-G formula based on SCr of 4.8 mg/dL (H)).  COAG No results found for: "INR", "PROTIME"  Radiology No results found.   Assessment/Plan 1. CKD (chronic kidney disease) stage 5, GFR less than 15 ml/min (HCC) Recommend:   At this time the patient does not have appropriate extremity access for dialysis   Patient should have a Left brachial axillary graft created.   The risks, benefits and alternative therapies were reviewed in detail with the patient.  All questions were answered.  The patient agrees to proceed with surgery.    The patient will follow up with me in the office after the surgery.   2. Type 2 diabetes mellitus with peripheral neuropathy ([place]) Continue hypoglycemic medications as already ordered, these medications have been reviewed and there are no changes at this time.   Hgb A1C to be monitored as already arranged by primary service     Hortencia Pilar, MD  10/20/2022 7:31 AM

## 2022-10-20 NOTE — Anesthesia Procedure Notes (Signed)
Procedure Name: LMA Insertion Date/Time: 10/20/2022 7:45 AM  Performed by: Hilbert Odor, CRNAPre-anesthesia Checklist: Patient identified, Emergency Drugs available, Suction available, Patient being monitored and Timeout performed Patient Re-evaluated:Patient Re-evaluated prior to induction Oxygen Delivery Method: Circle system utilized Preoxygenation: Pre-oxygenation with 100% oxygen Induction Type: IV induction Ventilation: Mask ventilation without difficulty LMA: LMA inserted LMA Size: 4.0 Tube type: Oral Number of attempts: 1 Placement Confirmation: positive ETCO2 and breath sounds checked- equal and bilateral Tube secured with: Tape Dental Injury: Teeth and Oropharynx as per pre-operative assessment

## 2022-10-20 NOTE — Transfer of Care (Signed)
Immediate Anesthesia Transfer of Care Note  Patient: [name]  Procedure(s) Performed: INSERTION OF ARTERIOVENOUS (AV) GORE-TEX GRAFT ARM ( BRACHIAL AXILLARY) (Left)  Patient Location: PACU  Anesthesia Type:General  Level of Consciousness: oriented, drowsy, and patient cooperative  Airway & Oxygen Therapy: Patient Spontanous Breathing and Patient connected to nasal cannula oxygen  Post-op Assessment: Report given to RN, Post -op Vital signs reviewed and stable, and Patient moving all extremities  Post vital signs: Reviewed and stable  Last Vitals:  Vitals Value Taken Time  BP 126/65 10/20/22 1011  Temp 36.1 C 10/20/22 1011  Pulse 85 10/20/22 1014  Resp 30 10/20/22 1014  SpO2 100 % 10/20/22 1014  Vitals shown include unvalidated device data.  Last Pain:  Vitals:   10/20/22 0645  TempSrc: Temporal  PainSc: 0-No pain         Complications: No notable events documented.

## 2022-10-20 NOTE — Interval H&P Note (Signed)
History and Physical Interval Note:  10/20/2022 7:33 AM  [name]  has presented today for surgery, with the diagnosis of CHRONIC KIDNEY DISEASE STAGE 5.  The various methods of treatment have been discussed with the patient and family. After consideration of risks, benefits and other options for treatment, the patient has consented to  Procedure(s): INSERTION OF ARTERIOVENOUS (AV) GORE-TEX GRAFT ARM ( BRACHIAL AXILLARY) (Left) as a surgical intervention.  The patient's history has been reviewed, patient examined, no change in status, stable for surgery.  I have reviewed the patient's chart and labs.  Questions were answered to the patient's satisfaction.     Hortencia Pilar

## 2022-10-20 NOTE — Progress Notes (Signed)
[name], [name] ([id number]) [id number] Page 1 of 10 Visit Report for 10/19/2022 Chief Complaint Document Details Patient Name: Date of Service: [name], [name] [name] 10/19/2022 10:45 A M Medical Record Number: [id number] Patient Account Number: [id number] Date of Birth/Sex: Treating RN: [birth year] (72 y.o. M) Primary Care Provider: Thersa [name] Other Clinician: Referring Provider: Treating Provider/Extender: [name] Weeks in Treatment: 1 Information Obtained from: Patient Chief Complaint Patients presents for treatment of an open diabetic ulcer on the right great toe Electronic Signature(s) Signed: 10/19/2022 11:45:29 AM By: [name] Maudlin MD FACS Entered By: [name] on 10/19/2022 11:45:29 -------------------------------------------------------------------------------- Debridement Details Patient Name: Date of Service: [name] [name], [name] 10/19/2022 10:45 A M Medical Record Number: [id number] Patient Account Number: [id number] Date of Birth/Sex: Treating RN: 1951-05-22 (72 y.o. [name] Primary Care Provider: Thersa [name] Other Clinician: Referring Provider: Treating Provider/Extender: [name] Weeks in Treatment: 1 Debridement Performed for Assessment: Wound #1 Right,Medial T Great oe Performed By: Physician [name] Maudlin, MD Debridement Type: Debridement Severity of Tissue Pre Debridement: Necrosis of bone Level of Consciousness (Pre-procedure): Awake and Alert Pre-procedure Verification/Time Out Yes - 11:25 Taken: Start Time: 11:28 Pain Control: Lidocaine 4% T opical Solution T Area Debrided (L x W): otal 0.8 (cm) x 0.9 (cm) = 0.72 (cm) Tissue and other material debrided: Non-Viable, Callus, Slough, Skin: Epidermis, Slough Level: Skin/Epidermis Debridement Description: Selective/Open Wound Instrument: Curette Bleeding: Minimum Hemostasis Achieved: Pressure Procedural Pain: 0 Post  Procedural Pain: 0 Response to Treatment: Procedure was tolerated well Level of Consciousness (Post- Awake and Alert procedure): Post Debridement Measurements of Total Wound Length: (cm) 0.8 Width: (cm) 0.9 Depth: (cm) 0.5 Volume: (cm) 0.283 Character of Wound/Ulcer Post Debridement: Improved Severity of Tissue Post Debridement: Necrosis of bone Felty, [name] ([id number]) [id number] Page 2 of 10 Post Procedure Diagnosis Same as Pre-procedure Notes scribed by [name] Gouty, RN for Dr. Celine [name] Electronic Signature(s) Signed: 10/19/2022 5:28:47 PM By: [name] Gouty RN, BSN Signed: 10/20/2022 7:36:34 AM By: [name] Maudlin MD FACS Previous Signature: 10/19/2022 12:01:41 PM Version By: [name] Maudlin MD FACS Entered By: [name] on 10/19/2022 16:35:36 -------------------------------------------------------------------------------- HPI Details Patient Name: Date of Service: [name] [name], [name] 10/19/2022 10:45 A M Medical Record Number: [id number] Patient Account Number: [id number] Date of Birth/Sex: Treating RN: October 27, 1950 (72 y.o. M) Primary Care Provider: Thersa [name] Other Clinician: Referring Provider: Treating Provider/Extender: [name] Weeks in Treatment: 1 History of Present Illness HPI Description: ADMISSION 10/12/2022 This is a 72 year old type II diabetic (last hemoglobin A1c 7.7 in October 2023) with stage V chronic kidney disease and hypertension. He has been followed by podiatry for an ulcer on his right great toe. He has a history of prior toe and foot infections requiring amputation. In November 2023, he had an outpatient MRI that demonstrated osteomyelitis. He was admitted to the hospital for IV antibiotics and subsequently underwent irrigation and debridement of the toe with biopsy and placement of married 3 layer matrix. Cultures grew out MRSA and osteomyelitis was seen on pathology. He was followed by infectious  disease and given a course of doxycycline and Augmentin. He completed this course at the end of December. He has been evaluated by vascular surgery with formal ABIs as well as consultation. He does have adequate blood flow at least to the dorsalis pedis. He last saw podiatry on January 4 of this year where he underwent debridement. It was felt he would benefit from specialized wound care and he was referred to our [name]  for further evaluation and management. On the dorsal aspect of his right great toe, there is a circular ulcer. There is a fair amount of undermining from callus and dry skin. The surface is a bit pale and fibrotic, but there is no obvious necrosis. 10/19/2022: He has been approved by his insurance to undergo hyperbaric oxygen therapy. EKG has been obtained and is normal. We are still waiting on his chest x-ray. The wound is clean with just a little bit of periwound callus and slough on the surface. Tendon is exposed. Electronic Signature(s) Signed: 10/19/2022 11:46:27 AM By: [name] Maudlin MD FACS Entered By: [name] on 10/19/2022 11:46:27 -------------------------------------------------------------------------------- Physical Exam Details Patient Name: Date of Service: [name] [name], [name] 10/19/2022 10:45 A M Medical Record Number: [id number] Patient Account Number: [id number] Date of Birth/Sex: Treating RN: 24-Feb-1951 (72 y.o. M) Primary Care Provider: Thersa [name] Other Clinician: Referring Provider: Treating Provider/Extender: [name] Weeks in Treatment: 1 Constitutional Hypertensive, asymptomatic. . . . no acute distress. Ears, Nose, Mouth, and Throat Tympanic membranes obscured by cerumen. [name], [name] ([id number]) [id number] Page 3 of 10 Respiratory Normal work of breathing on room air. Clear to auscultation bilaterally. Cardiovascular . Notes 10/19/2022: The wound is clean with just a little bit of  periwound callus and slough on the surface. Tendon is exposed. Electronic Signature(s) Signed: 10/19/2022 11:49:00 AM By: [name] Maudlin MD FACS Entered By: [name] on 10/19/2022 11:49:00 -------------------------------------------------------------------------------- Physician Orders Details Patient Name: Date of Service: [name], [name] 10/19/2022 10:45 A M Medical Record Number: [id number] Patient Account Number: [id number] Date of Birth/Sex: Treating RN: Mar 25, 1951 (72 y.o. [name] Primary Care Provider: Thersa [name] Other Clinician: Referring Provider: Treating Provider/Extender: [name] Weeks in Treatment: 1 Verbal / Phone Orders: No Diagnosis Coding ICD-10 Coding Code Description [phone] Non-pressure chronic ulcer of other part of right foot with fat layer exposed E11.621 Type 2 diabetes mellitus with foot ulcer N18.5 Chronic kidney disease, stage 5 M86.9 Osteomyelitis, unspecified E11.42 Type 2 diabetes mellitus with diabetic polyneuropathy I10 Essential (primary) hypertension Follow-up Appointments ppointment in 1 week. - Dr. Celine [name] Rm 4 Return A Thursday 10/26/22 at 2:30 pm after HBO Anesthetic Wound #1 Right,Medial T Great oe (In clinic) Topical Lidocaine 5% applied to wound bed - prior to debridement Bathing/ Shower/ Hygiene May shower and wash wound with soap and water. Off-Loading Wound #1 Right,Medial T Great oe Other: - Diabetic shoe Hyperbaric Oxygen Therapy Wound #1 Right,Medial T Great oe Evaluate for HBO Therapy Indication: - chronic refractory osteomyelitis If appropriate for treatment, begin HBOT per protocol: 2.0 ATA for 90 Minutes without A Breaks ir Total Number of Treatments: - 40 One treatments per day (delivered Monday through Friday unless otherwise specified in Special Instructions below): Finger stick Blood Glucose Pre- and Post- HBOT Treatment. Follow Hyperbaric Oxygen Glycemia Protocol A frin  (Oxymetazoline HCL) 0.05% nasal spray - 1 spray in both nostrils daily as needed prior to HBO treatment for difficulty clearing ears Wound Treatment Wound #1 - T Great oe Wound Laterality: Right, Medial Cleanser: Soap and Water Discharge Instructions: May shower and wash wound with dial antibacterial soap and water prior to dressing change. [place], [name] ([id number]) [id number] Page 4 of 10 Peri-Wound Care: Sween Lotion (Moisturizing lotion) Discharge Instructions: Apply moisturizing lotion as directed Prim Dressing: Sorbalgon AG Dressing, 4x4 (in/in) ary Discharge Instructions: Apply to wound bed as instructed Secondary Dressing: Optifoam Non-Adhesive Dressing, 4x4 in Discharge Instructions: Apply over primary dressing as directed. Secondary Dressing:  Woven Gauze Sponges 2x2 in Discharge Instructions: Apply over primary dressing as directed. Secured With: Child psychotherapist, Sterile 2x75 (in/in) Discharge Instructions: Secure with stretch gauze as directed. Radiology X-ray, Chest 2 view - pre hyperbaric oxygen therapy - (ICD10 E11.621 - Type 2 diabetes mellitus with foot ulcer) GLYCEMIA INTERVENTIONS PROTOCOL PRE-HBO GLYCEMIA INTERVENTIONS ACTION INTERVENTION Obtain pre-HBO capillary blood glucose (ensure 1 physician order is in chart). A. Notify HBO physician and await physician orders. 2 If result is 70 mg/dl or below: B. If the result meets the hospital definition of a critical result, follow hospital policy. A. Give patient an 8 ounce Glucerna Shake, an 8 ounce Ensure, or 8 ounces of a Glucerna/Ensure equivalent dietary supplement*. B. Wait 30 minutes. If result is 71 mg/dl to 130 mg/dl: C. Retest patients capillary blood glucose (CBG). D. If result greater than or equal to 110 mg/dl, proceed with HBO. If result less than 110 mg/dl, notify HBO physician and consider holding HBO. If result is 131 mg/dl to 249 mg/dl: A.  Proceed with HBO. A. Notify HBO physician and await physician orders. B. It is recommended to hold HBO and do If result is 250 mg/dl or greater: blood/urine ketone testing. C. If the result meets the hospital definition of a critical result, follow hospital policy. POST-HBO GLYCEMIA INTERVENTIONS ACTION INTERVENTION Obtain post HBO capillary blood glucose (ensure 1 physician order is in chart). A. Notify HBO physician and await physician orders. 2 If result is 70 mg/dl or below: B. If the result meets the hospital definition of a critical result, follow hospital policy. A. Give patient an 8 ounce Glucerna Shake, an 8 ounce Ensure, or 8 ounces of a Glucerna/Ensure equivalent dietary supplement*. B. Wait 15 minutes for symptoms of If result is 71 mg/dl to 100 mg/dl: hypoglycemia (i.e. nervousness, anxiety, sweating, chills, clamminess, irritability, confusion, tachycardia or dizziness). C. If patient asymptomatic, discharge patient. If patient symptomatic, repeat capillary blood glucose (CBG) and notify HBO physician. If result is 101 mg/dl to 249 mg/dl: A. Discharge patient. A. Notify HBO physician and await physician orders. B. It is recommended to do blood/urine ketone If result is 250 mg/dl or greater: testing. C. If the result meets the hospital definition of a critical result, follow hospital policy. *Juice or candies are NOT equivalent products. If patient refuses the Glucerna or Ensure, please consult the hospital dietitian for an appropriate substitute. Electronic Signature(s) Signed: 10/19/2022 12:01:41 PM By: [name] Maudlin MD FACS Entered By: [name] on 10/19/2022 11:51:13 [place], [name] ([id number]) [id number] Page 5 of 10 Prescription 10/19/2022 -------------------------------------------------------------------------------- Felecia Shelling MD Patient Name: Provider: 12-Jul-1951 [id number] Date of Birth:  NPI#: Jerilynn Mages [id number] Sex: DEA #: [phone] Phone #: License #: [place]room Patient Address: [place] [address] [place], [place] [address] [name] [place], [place] [address] [phone] Allergies losartan potassium; sildenafil Provider's Orders X-ray, Chest 2 view - ICD10: E11.621 - pre hyperbaric oxygen therapy Hand Signature: Date(s): Electronic Signature(s) Signed: 10/19/2022 11:53:44 AM By: [name] Maudlin MD FACS Entered By: [name] on 10/19/2022 11:53:44 -------------------------------------------------------------------------------- Problem List Details Patient Name: Date of Service: [facility], [name] 10/19/2022 10:45 A M Medical Record Number: [id number] Patient Account Number: [id number] Date of Birth/Sex: Treating RN: 1951-08-21 (72 y.o. [name] Primary Care Provider: Thersa [name] Other Clinician: Referring Provider: Treating Provider/Extender: [name] Weeks in Treatment: 1 Active Problems ICD-10 Encounter Code Description Active Date MDM Diagnosis L97.512 Non-pressure chronic  ulcer of other part of right foot with fat layer exposed 10/12/2022 No Yes E11.621 Type 2 diabetes mellitus with foot ulcer 10/12/2022 No Yes N18.5 Chronic kidney disease, stage 5 10/12/2022 No Yes M86.9 Osteomyelitis, unspecified 10/12/2022 No Yes E11.42 Type 2 diabetes mellitus with diabetic polyneuropathy 10/12/2022 No Yes Char, [name] ([id number]) [phone].pdf Page 6 of 10 I10 Essential (primary) hypertension 10/12/2022 No Yes Inactive Problems Resolved Problems Electronic Signature(s) Signed: 10/19/2022 11:45:15 AM By: [name] Maudlin MD FACS Entered By: [name] on 10/19/2022 11:45:14 -------------------------------------------------------------------------------- Progress Note Details Patient Name: Date of Service: [name], [name] 10/19/2022 10:45 A  M Medical Record Number: [id number] Patient Account Number: [id number] Date of Birth/Sex: Treating RN: Sep 29, 1951 (72 y.o. M) Primary Care Provider: Thersa [name] Other Clinician: Referring Provider: Treating Provider/Extender: [name] Weeks in Treatment: 1 Subjective Chief Complaint Information obtained from Patient Patients presents for treatment of an open diabetic ulcer on the right great toe History of Present Illness (HPI) ADMISSION 10/12/2022 This is a 72 year old type II diabetic (last hemoglobin A1c 7.7 in October 2023) with stage V chronic kidney disease and hypertension. He has been followed by podiatry for an ulcer on his right great toe. He has a history of prior toe and foot infections requiring amputation. In November 2023, he had an outpatient MRI that demonstrated osteomyelitis. He was admitted to the hospital for IV antibiotics and subsequently underwent irrigation and debridement of the toe with biopsy and placement of married 3 layer matrix. Cultures grew out MRSA and osteomyelitis was seen on pathology. He was followed by infectious disease and given a course of doxycycline and Augmentin. He completed this course at the end of December. He has been evaluated by vascular surgery with formal ABIs as well as consultation. He does have adequate blood flow at least to the dorsalis pedis. He last saw podiatry on January 4 of this year where he underwent debridement. It was felt he would benefit from specialized wound care and he was referred to our [name] for further evaluation and management. On the dorsal aspect of his right great toe, there is a circular ulcer. There is a fair amount of undermining from callus and dry skin. The surface is a bit pale and fibrotic, but there is no obvious necrosis. 10/19/2022: He has been approved by his insurance to undergo hyperbaric oxygen therapy. EKG has been obtained and is normal. We are still waiting on his chest  x-ray. The wound is clean with just a little bit of periwound callus and slough on the surface. Tendon is exposed. Patient History Information obtained from Patient. Family History Diabetes - Mother,Siblings, Heart Disease - Siblings, Hypertension - Siblings, Seizures - Father, Stroke - Siblings, No family history of Cancer, Hereditary Spherocytosis, Kidney Disease, Lung Disease, Thyroid Problems, Tuberculosis. Social History Former smoker, Marital Status - Married, Alcohol Use - Rarely, Drug Use - No History, Caffeine Use - Daily. Medical History Eyes Patient has history of Cataracts - rt eye Ear/Nose/Mouth/Throat Denies history of Chronic sinus problems/congestion Hematologic/Lymphatic Denies history of Anemia, Hemophilia, Human Immunodeficiency Virus, Lymphedema, Sickle Cell Disease Respiratory Denies history of Aspiration, Asthma, Chronic Obstructive Pulmonary Disease (COPD), Pneumothorax, Tuberculosis Cardiovascular Patient has history of Hypertension Gastrointestinal Denies history of Cirrhosis , Colitis, Crohnoos, Hepatitis A, Hepatitis B, Hepatitis C Endocrine Patient has history of Type II Diabetes Genitourinary Denies history of End Stage Renal Disease [place], [name] ([id number]) [id number] Page 7 of 10 Immunological Denies history of Lupus Erythematosus, Raynaudoos, Scleroderma Integumentary (Skin) Denies history of  History of Burn Musculoskeletal Patient has history of Osteomyelitis - MRI confirmed (08/16/22) Denies history of Gout Neurologic Patient has history of Neuropathy Psychiatric Patient has history of Confinement Anxiety Denies history of Anorexia/bulimia Medical A Surgical History Notes nd Genitourinary CKD IV Objective Constitutional Hypertensive, asymptomatic. no acute distress. Vitals Time Taken: 10:57 AM, Height: 72 in, Weight: 185 lbs, BMI: 25.1, Temperature: 98.2 F, Pulse: 87 bpm, Respiratory Rate: 18  breaths/min, Blood Pressure: 159/72 mmHg, Capillary Blood Glucose: 101 mg/dl. Ears, Nose, Mouth, and Throat Tympanic membranes obscured by cerumen. Respiratory Normal work of breathing on room air. Clear to auscultation bilaterally. General Notes: 10/19/2022: The wound is clean with just a little bit of periwound callus and slough on the surface. Tendon is exposed. Integumentary (Hair, Skin) Wound #1 status is Open. Original cause of wound was Pressure Injury. The date acquired was: 08/16/2022. The wound has been in treatment 1 weeks. The wound is located on the Right,Medial T Great. The wound measures 0.8cm length x 0.9cm width x 0.5cm depth; 0.565cm^2 area and 0.283cm^3 volume. oe There is tendon and Fat Layer (Subcutaneous Tissue) exposed. There is no tunneling noted, however, there is undermining starting at 3:00 and ending at 7:00 with a maximum distance of 0.2cm. There is a medium amount of serosanguineous drainage noted. The wound margin is distinct with the outline attached to the wound base. There is medium (34-66%) red granulation within the wound bed. There is a medium (34-66%) amount of necrotic tissue within the wound bed including Adherent Slough. The periwound skin appearance had no abnormalities noted for moisture. The periwound skin appearance had no abnormalities noted for color. The periwound skin appearance exhibited: Callus. Periwound temperature was noted as No Abnormality. Assessment Active Problems ICD-10 Non-pressure chronic ulcer of other part of right foot with fat layer exposed Type 2 diabetes mellitus with foot ulcer Chronic kidney disease, stage 5 Osteomyelitis, unspecified Type 2 diabetes mellitus with diabetic polyneuropathy Essential (primary) hypertension Procedures Wound #1 Pre-procedure diagnosis of Wound #1 is a Diabetic Wound/Ulcer of the Lower Extremity located on the Right,Medial T Great .Severity of Tissue Pre oe Debridement is: Necrosis of bone.  There was a Selective/Open Wound Skin/Epidermis Debridement with a total area of 0.72 sq cm performed by [name] Maudlin, MD. With the following instrument(s): Curette to remove Non-Viable tissue/material. Material removed includes Callus, Slough, and Skin: Epidermis after achieving pain control using Lidocaine 4% Topical Solution. No specimens were taken. A time out was conducted at 11:25, prior to the start of the procedure. A Minimum amount of bleeding was controlled with Pressure. The procedure was tolerated well with a pain level of 0 throughout and a pain level of 0 following the procedure. Post Debridement Measurements: 0.8cm length x 0.9cm width x 0.5cm depth; 0.283cm^3 volume. Character of Wound/Ulcer Post Debridement is improved. Severity of Tissue Post Debridement is: Necrosis of bone. Post procedure Diagnosis Wound #1: Same as Pre-Procedure General Notes: scribed by [name] Gouty, RN for Dr. Celine [name]. [place], [name] ([id number]) [id number] Page 8 of 10 Plan Follow-up Appointments: Return Appointment in 1 week. - Dr. Celine [name] Rm 4 Thursday 10/26/22 at 2:30 pm after HBO Anesthetic: Wound #1 Right,Medial T Great: oe (In clinic) Topical Lidocaine 5% applied to wound bed - prior to debridement Bathing/ Shower/ Hygiene: May shower and wash wound with soap and water. Off-Loading: Wound #1 Right,Medial T Great: oe Other: - Diabetic shoe Hyperbaric Oxygen Therapy: Wound #1 Right,Medial T Great: oe Evaluate for HBO Therapy Indication: - chronic refractory osteomyelitis If appropriate  for treatment, begin HBOT per protocol: 2.0 ATA for 90 Minutes without Air Breaks T Number of Treatments: - 40 otal One treatments per day (delivered Monday through Friday unless otherwise specified in Special Instructions below): Finger stick Blood Glucose Pre- and Post- HBOT Treatment. Follow Hyperbaric Oxygen Glycemia Protocol Afrin (Oxymetazoline HCL) 0.05% nasal spray - 1  spray in both nostrils daily as needed prior to HBO treatment for difficulty clearing ears Radiology ordered were: X-ray, Chest 2 view - pre hyperbaric oxygen therapy WOUND #1: - T Great Wound Laterality: Right, Medial oe Cleanser: Soap and Water Discharge Instructions: May shower and wash wound with dial antibacterial soap and water prior to dressing change. Peri-Wound Care: Sween Lotion (Moisturizing lotion) Discharge Instructions: Apply moisturizing lotion as directed Prim Dressing: Sorbalgon AG Dressing, 4x4 (in/in) ary Discharge Instructions: Apply to wound bed as instructed Secondary Dressing: Optifoam Non-Adhesive Dressing, 4x4 in Discharge Instructions: Apply over primary dressing as directed. Secondary Dressing: Woven Gauze Sponges 2x2 in Discharge Instructions: Apply over primary dressing as directed. Secured With: Child psychotherapist, Sterile 2x75 (in/in) Discharge Instructions: Secure with stretch gauze as directed. 10/19/2022: The wound is clean with just a little bit of periwound callus and slough on the surface. Tendon is exposed. I used a curette to debride callus and slough from the wound. We will continue silver alginate with a foam donut. He is going to go obtain his chest x-ray after his visit today and he will be able to start his hyperbaric oxygen therapy on Monday. I will see him for wound care visit 1 week from today. Electronic Signature(s) Signed: 10/19/2022 5:28:47 PM By: [name] Gouty RN, BSN Signed: 10/20/2022 7:36:34 AM By: [name] Maudlin MD FACS Previous Signature: 10/19/2022 12:28:37 PM Version By: [name] Maudlin MD FACS Previous Signature: 10/19/2022 11:52:22 AM Version By: [name] Maudlin MD FACS Entered By: [name] on 10/19/2022 16:35:54 -------------------------------------------------------------------------------- HxROS Details Patient Name: Date of Service: [name], [name] 10/19/2022 10:45 A M Medical Record Number:  [id number] Patient Account Number: [id number] Date of Birth/Sex: Treating RN: 03/25/1951 (72 y.o. M) Primary Care Provider: Thersa [name] Other Clinician: Referring Provider: Treating Provider/Extender: [name] Weeks in Treatment: 1 Information Obtained From Patient Eyes Medical History: Positive for: Cataracts - rt eye [place], [name] ([id number]) [phone].pdf Page 9 of 10 Ear/Nose/Mouth/Throat Medical History: Negative for: Chronic sinus problems/congestion Hematologic/Lymphatic Medical History: Negative for: Anemia; Hemophilia; Human Immunodeficiency Virus; Lymphedema; Sickle Cell Disease Respiratory Medical History: Negative for: Aspiration; Asthma; Chronic Obstructive Pulmonary Disease (COPD); Pneumothorax; Tuberculosis Cardiovascular Medical History: Positive for: Hypertension Gastrointestinal Medical History: Negative for: Cirrhosis ; Colitis; Crohns; Hepatitis A; Hepatitis B; Hepatitis C Endocrine Medical History: Positive for: Type II Diabetes Time with diabetes: 2000 Treated with: Insulin Blood sugar tested every day: Yes Tested : Genitourinary Medical History: Negative for: End Stage Renal Disease Past Medical History Notes: CKD IV Immunological Medical History: Negative for: Lupus Erythematosus; Raynauds; Scleroderma Integumentary (Skin) Medical History: Negative for: History of Burn Musculoskeletal Medical History: Positive for: Osteomyelitis - MRI confirmed (08/16/22) Negative for: Gout Neurologic Medical History: Positive for: Neuropathy Psychiatric Medical History: Positive for: Confinement Anxiety Negative for: Anorexia/bulimia HBO Extended History Items Eyes: Cataracts Immunizations Pneumococcal Vaccine: Received Pneumococcal Vaccination: Yes Received Pneumococcal Vaccination On or After 60th Birthday: Yes Implantable Devices [name], [place] ([id number]) [id number]  Page 10 of 10 None Family and Social History Cancer: No; Diabetes: Yes - Mother,Siblings; Heart Disease: Yes - Siblings; Hereditary Spherocytosis: No; Hypertension: Yes - Siblings; Kidney Disease: No; Lung Disease: No;  Seizures: Yes - Father; Stroke: Yes - Siblings; Thyroid Problems: No; Tuberculosis: No; Former smoker; Marital Status - Married; Alcohol Use: Rarely; Drug Use: No History; Caffeine Use: Daily; Financial Concerns: No; Food, Clothing or Shelter Needs: No; Support System Lacking: No; Transportation Concerns: No Electronic Signature(s) Signed: 10/19/2022 12:01:41 PM By: [name] Maudlin MD FACS Entered By: [name] on 10/19/2022 11:46:36 -------------------------------------------------------------------------------- SuperBill Details Patient Name: Date of Service: [name] [name], [name] 10/19/2022 Medical Record Number: [id number] Patient Account Number: [id number] Date of Birth/Sex: Treating RN: [birth year] (72 y.o. M) Primary Care Provider: Thersa [name] Other Clinician: Referring Provider: Treating Provider/Extender: [name] Weeks in Treatment: 1 Diagnosis Coding ICD-10 Codes Code Description [phone] Non-pressure chronic ulcer of other part of right foot with fat layer exposed E11.621 Type 2 diabetes mellitus with foot ulcer N18.5 Chronic kidney disease, stage 5 M86.9 Osteomyelitis, unspecified E11.42 Type 2 diabetes mellitus with diabetic polyneuropathy I10 Essential (primary) hypertension Facility Procedures : CPT4 Code: [id number] Description: 35465 - DEBRIDE WOUND 1ST 20 SQ CM OR < ICD-10 Diagnosis Description L97.512 Non-pressure chronic ulcer of other part of right foot with fat layer exposed Modifier: Quantity: 1 Physician Procedures : CPT4 Code Description Modifier [id number] - WC PHYS LEVEL 3 - EST PT 25 ICD-10 Diagnosis Description L97.512 Non-pressure chronic ulcer of other part of right foot with fat layer exposed E11.621 Type 2  diabetes mellitus with foot ulcer M86.9  Osteomyelitis, unspecified N18.5 Chronic kidney disease, stage 5 Quantity: 1 : [id number] - WC PHYS DEBR WO ANESTH 20 SQ CM ICD-10 Diagnosis Description L97.512 Non-pressure chronic ulcer of other part of right foot with fat layer exposed Quantity: 1 Electronic Signature(s) Signed: 10/19/2022 11:53:40 AM By: [name] Maudlin MD FACS Entered By: [name] on 10/19/2022 11:53:40

## 2022-10-20 NOTE — Op Note (Signed)
OPERATIVE NOTE   PROCEDURE: left brachial axillary arteriovenous graft placement  PRE-OPERATIVE DIAGNOSIS: End Stage Renal Disease  POST-OPERATIVE DIAGNOSIS: End Stage Renal Disease  SURGEON: Belenda Cruise Syretta Kochel  ASSISTANT(S): Annalee Genta, NP  ANESTHESIA: general  ESTIMATED BLOOD LOSS: <50 cc  FINDING(S): 4 mm Brachial artery and 6-8 mm axillary vein  SPECIMEN(S):  none  INDICATIONS:   [name] is a 72 y.o. male who presents with end stage renal disease.  The patient is scheduled for left brachial axillary AV graft placement.  The patient is aware the risks include but are not limited to: bleeding, infection, steal syndrome, nerve damage, ischemic monomelic neuropathy, failure to mature, and need for additional procedures.  The patient is aware of the risks of the procedure and elects to proceed forward.  DESCRIPTION: After full informed written consent was obtained from the patient, the patient was brought back to the operating room and placed supine upon the operating table.  Prior to induction, the patient received IV antibiotics.   After obtaining adequate anesthesia, the patient was then prepped and draped in the standard fashion for a left arm access procedure.   A first assistant was required to provide a safe and appropriate environment for executing the surgery.  The assistant was integral in providing retraction, exposure, running suture providing suction and in the closing process.   A linear incision was then created along the medial border of the biceps muscle just proximal to the antecubital crease and the brachial artery which was exposed through. The brachial artery was then looped proximally and distally with Silastic Vesseloops. Side branches were controlled with 4-0 silk ties.  Attention was then turned to the exposure of the axillary vein. Linear incision was then created medial to the proximal portion of the biceps at the level of the anterior axillary  crease. The axillary vein was exposed and again looped proximally and distally with Silastic vessel loops. Associated tributaries were also controlled with Silastic Vesseloops.  The Gore tunneler was then delivered onto the field and a subcutaneous path was made from the arterial incision to the venous incision. A 4-7 tapered PTFE propatent graft by Simeon Craft was then pulled through the subcutaneous tunnel. The arterial 4 mm portion was then approximated to the brachial artery. Brachial artery was controlled proximally and distally with the Silastic Vesseloops. Arteriotomy was made with an 11 blade scalpel and extended with Potts scissors and a 6-0 Prolene stay suture was placed. End graft to side brachial artery anastomosis was then fashioned with running CV 6 suture. Flushing maneuvers were performed suture line was hemostatic and the graft was then assessed for proper position and ease of future cannulation. Heparinized saline was infused into the vein and the graft was clamped with a vascular clamp. With the graft pressurized it was approximated to the axillary vein in its native bed and then marked with a surgical marker. The vein was then delivered into the surgical field and controlled with the Silastic vessel loops. Venotomy was then made with an 11 blade scalpel and extended with Potts scissors and a 6-0 Prolene suture was used as stay suture. The the graft was then sewn to the vein in an end graft to side vein fashion using running CV 6 suture.  Flushing maneuvers were performed and the artery was allowed to forward and back bleed.  Flow was then established through the AV graft  There was good  thrill in the venous outflow, and there was 1+ palpable radial  pulse.  At this point, I irrigated out the surgical wounds.  There was no further active bleeding.  The subcutaneous tissue was reapproximated with a running stitch of 3-0 Vicryl.  The skin was then reapproximated with a running subcuticular stitch of  4-0 Vicryl.  The skin was then cleaned, dried, and reinforced with Dermabond.    The patient tolerated this procedure well.   COMPLICATIONS: None  CONDITION: [name]  Office: [phone]   10/20/2022, 10:16 AM

## 2022-10-20 NOTE — H&P (View-Only) (Signed)
MRN : [id number]  [name] is a 72 y.o. (August 14, 1951) male who presents with chief complaint of check access.  History of Present Illness:   The patient presents to [facility] for creation of AV access.  The patient has chronic renal insufficiency stage V secondary to hypertension. The patient's most recent creatinine clearance is less than 20. The patient volume status has not yet become an issue. Patient's blood pressures been relatively well controlled. There are mild uremic symptoms which appear to be relatively well tolerated at this time.   The patient notes the kidney problem has been present for a long time and has been progressively getting worse.   The patient is followed by nephrology.     The patient is right-handed.   The patient has been considering the various methods of dialysis and wishes to proceed with hemodialysis and therefore creation of AV access.   No recent shortening of the patient's walking distance or new symptoms consistent with claudication.  No history of rest pain symptoms. No new ulcers or wounds of the lower extremities have occurred.   The patient denies amaurosis fugax or recent TIA symptoms. There are no recent neurological changes noted. There is no history of DVT, PE or superficial thrombophlebitis. No recent episodes of angina or shortness of breath documented.    Patient has adequate access for a left brachial axillary AV graft insertion.  Current Meds  Medication Sig   amLODipine (NORVASC) 10 MG tablet Take 10 mg by mouth daily in the afternoon.   atorvastatin (LIPITOR) 80 MG tablet Take 80 mg by mouth daily in the afternoon.   calcitRIOL (ROCALTROL) 0.25 MCG capsule Take 0.25 mcg by mouth daily in the afternoon.   NOVOLOG MIX 70/30 FLEXPEN (70-30) 100 UNIT/ML FlexPen Inject 20 Units into the skin 2 (two) times daily with a meal.    Past Medical History:  Diagnosis Date   Anemia    Chronic kidney disease, stage  5 (HCC)    Controlled type 2 diabetes mellitus with chronic kidney disease ([place])    Diabetic neuropathy ([place])    Diabetic ulcer of foot associated with diabetes mellitus due to underlying condition, limited to breakdown of skin ([place])    left   GERD (gastroesophageal reflux disease)    Hyperlipidemia    Hypertension    Non compliance w medication regimen    Osteomyelitis ([place]) 08/2022   great toe of right foot   Osteomyelitis of left foot ([place])    Secondary hyperparathyroidism of renal origin [facility])    per nephrology lov dr Candiss Norse 05-22-2022   Stroke [facility]) 2016   no deficits    Past Surgical History:  Procedure Laterality Date   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   left big toe, second toe   APPENDECTOMY     BIOPSY  08/10/2020   Procedure: BIOPSY;  Surgeon: Harvel Quale, MD;  [facility];  Service: Gastroenterology;;   CHOLECYSTECTOMY     COLON SURGERY     blockage   COLONOSCOPY WITH PROPOFOL N/A 08/10/2020   Procedure: COLONOSCOPY WITH PROPOFOL;  Surgeon: Harvel Quale, MD;  [facility];  Service: Gastroenterology;  Laterality: N/A;  257   GRAFT APPLICATION Left 50/51/8335   Procedure: GRAFT APPLICATION;  Surgeon: [name], DPM;  Location: [place];  Service: Podiatry;  Laterality:  Left;   HERNIA REPAIR     I & D EXTREMITY Left 10/10/2021   Procedure: IRRIGATION AND DEBRIDEMENT left foot;  Surgeon: Evelina Bucy, DPM;  Location: [place];  Service: Podiatry;  Laterality: Left;   I & D EXTREMITY Right 08/19/2022   Procedure: IRRIGATION AND DEBRIDEMENT RIGHT GREAT TOE GRAFT APPLICATION WITH BIOPSY;  Surgeon: Criselda Peaches, DPM;  Location: [place];  Service: Podiatry;  Laterality: Right;   IRRIGATION AND DEBRIDEMENT FOOT Left 10/16/2021   Procedure: IRRIGATION AND DEBRIDEMENT FOOT;  Surgeon: [name], DPM;  Location: [place];  Service: Podiatry;  Laterality: Left;  Abscess left heel   METATARSAL HEAD  EXCISION Left 12/08/2019   Procedure: METATARSAL HEAD RECESSION THIRD LEFT;  Surgeon: [name], DPM;  Location: [place];  Service: Podiatry;  Laterality: Left;   POLYPECTOMY  08/10/2020   Procedure: POLYPECTOMY;  Surgeon: Harvel Quale, MD;  [facility];  Service: Gastroenterology;;   WOUND DEBRIDEMENT Left 12/08/2019   Procedure: DEBRIDEMENT WOUND;  Surgeon: [name], DPM;  Location: [place];  Service: Podiatry;  Laterality: Left;   WOUND DEBRIDEMENT Left 10/12/2021   Procedure: LEFT FOOT WOUND DEBRIDEMENT AND IRRIGATION, 1ST METATARSAL RESECTION;  Surgeon: Evelina Bucy, DPM;  Location: [place];  Service: Podiatry;  Laterality: Left;    Social History Social History   Tobacco Use   Smoking status: Former   Smokeless tobacco: Never  Scientific laboratory technician Use: Never used  Substance Use Topics   Alcohol use: Not Currently   Drug use: Never    Family History History reviewed. No pertinent family history.  Allergies  Allergen Reactions   Losartan Potassium Other (See Comments)    hyperkalemia   Sildenafil Other (See Comments)    Not effective. "Did not like the way it made me feel"     REVIEW OF SYSTEMS (Negative unless checked)  Constitutional: '[]'$ Weight loss  '[]'$ Fever  '[]'$ Chills Cardiac: '[]'$ Chest pain   '[]'$ Chest pressure   '[]'$ Palpitations   '[]'$ Shortness of breath when laying flat   '[]'$ Shortness of breath with exertion. Vascular:  '[]'$ Pain in legs with walking   '[]'$ Pain in legs at rest  '[]'$ History of DVT   '[]'$ Phlebitis   '[]'$ Swelling in legs   '[]'$ Varicose veins   '[]'$ Non-healing ulcers Pulmonary:   '[]'$ Uses home oxygen   '[]'$ Productive cough   '[]'$ Hemoptysis   '[]'$ Wheeze  '[]'$ COPD   '[]'$ Asthma Neurologic:  '[]'$ Dizziness   '[]'$ Seizures   '[]'$ History of stroke   '[]'$ History of TIA  '[]'$ Aphasia   '[]'$ Vissual changes   '[]'$ Weakness or numbness in arm   '[]'$ Weakness or numbness in leg Musculoskeletal:   '[]'$ Joint swelling   '[]'$ Joint pain   '[]'$ Low back  pain Hematologic:  '[]'$ Easy bruising  '[]'$ Easy bleeding   '[]'$ Hypercoagulable state   '[]'$ Anemic Gastrointestinal:  '[]'$ Diarrhea   '[]'$ Vomiting  '[]'$ Gastroesophageal reflux/heartburn   '[]'$ Difficulty swallowing. Genitourinary:  '[x]'$ Chronic kidney disease   '[]'$ Difficult urination  '[]'$ Frequent urination   '[]'$ Blood in urine Skin:  '[]'$ Rashes   '[]'$ Ulcers  Psychological:  '[]'$ History of anxiety   '[]'$  History of major depression.  Physical Examination  Vitals:   10/20/22 0645  BP: (!) 161/71  Pulse: 98  Resp: 18  Temp: 98.3 F (36.8 C)  TempSrc: Temporal  SpO2: 100%  Weight: 83.9 kg  Height: 6' (1.829 m)   Body mass index is 25.09 kg/m. Gen: WD/WN, NAD Head: [place]/AT, No temporalis wasting.  Ear/Nose/Throat: Hearing grossly intact, nares w/o erythema or drainage Eyes: PER,  EOMI, sclera nonicteric.  Neck: Supple, no gross masses or lesions.  No JVD.  Pulmonary:  Good air movement, no audible wheezing, no use of accessory muscles.  Cardiac: RRR, precordium non-hyperdynamic. Vascular:    Vessel Right Left  Radial Palpable Palpable  Brachial Palpable Palpable  Gastrointestinal: soft, non-distended. No guarding/no peritoneal signs.  Musculoskeletal: M/S 5/5 throughout.  No deformity.  Neurologic: CN 2-12 intact. Pain and light touch intact in extremities.  Symmetrical.  Speech is fluent. Motor exam as listed above. Psychiatric: Judgment intact, Mood & affect appropriate for pt's clinical situation. Dermatologic: No rashes or ulcers noted.  No changes consistent with cellulitis.   CBC Lab Results  Component Value Date   WBC 9.4 10/18/2022   HGB 11.6 (L) 10/20/2022   HCT 34.0 (L) 10/20/2022   MCV 88.1 10/18/2022   PLT 210 10/18/2022    BMET    Component Value Date/Time   NA 144 10/20/2022 0643   K 4.9 10/20/2022 0643   CL 114 (H) 10/20/2022 0643   CO2 23 10/18/2022 1354   GLUCOSE 160 (H) 10/20/2022 0643   BUN 35 (H) 10/20/2022 0643   CREATININE 4.80 (H) 10/20/2022 0643   CREATININE 4.39 (H)  10/05/2022 0946   CALCIUM 8.9 10/18/2022 1354   GFRNONAA 16 (L) 10/18/2022 1354   Estimated Creatinine Clearance: 15.9 mL/min (A) (by C-G formula based on SCr of 4.8 mg/dL (H)).  COAG No results found for: "INR", "PROTIME"  Radiology No results found.   Assessment/Plan 1. CKD (chronic kidney disease) stage 5, GFR less than 15 ml/min (HCC) Recommend:   At this time the patient does not have appropriate extremity access for dialysis   Patient should have a Left brachial axillary graft created.   The risks, benefits and alternative therapies were reviewed in detail with the patient.  All questions were answered.  The patient agrees to proceed with surgery.    The patient will follow up with me in the office after the surgery.   2. Type 2 diabetes mellitus with peripheral neuropathy ([place]) Continue hypoglycemic medications as already ordered, these medications have been reviewed and there are no changes at this time.   Hgb A1C to be monitored as already arranged by primary service     Hortencia Pilar, MD  10/20/2022 7:31 AM

## 2022-10-20 NOTE — Anesthesia Preprocedure Evaluation (Signed)
Anesthesia Evaluation  Patient identified by MRN, date of birth, ID band Patient awake    Reviewed: Allergy & Precautions, NPO status , Patient's Chart, lab work & pertinent test results  History of Anesthesia Complications Negative for: history of anesthetic complications  Airway Mallampati: II  TM Distance: >3 FB Neck ROM: Full    Dental  (+) Edentulous Upper, Edentulous Lower   Pulmonary neg sleep apnea, neg COPD, Patient abstained from smoking.Not current smoker, former smoker   Pulmonary exam normal breath sounds clear to auscultation       Cardiovascular Exercise Tolerance: Good METShypertension, Pt. on medications (-) CAD and (-) Past MI (-) dysrhythmias  Rhythm:Regular Rate:Normal - Systolic murmurs    Neuro/Psych CVA, No Residual Symptoms  negative psych ROS   GI/Hepatic ,GERD  ,,(+)     (-) substance abuse    Endo/Other  diabetes, Type 2, Insulin Dependent  Has not taken GLP1 in over a month  Renal/GU CRFRenal diseaseNot on dialysis yet  negative genitourinary   Musculoskeletal Osteomyelitis toe   Abdominal Normal abdominal exam  (+)   Peds  Hematology  (+) Blood dyscrasia, anemia   Anesthesia Other Findings Past Medical History: No date: Anemia No date: Chronic kidney disease, stage 5 (HCC) No date: Controlled type 2 diabetes mellitus with chronic kidney  disease (HCC) No date: Diabetic neuropathy (HCC) No date: Diabetic ulcer of foot associated with diabetes mellitus due  to underlying condition, limited to breakdown of skin (HCC)     Comment:  left No date: GERD (gastroesophageal reflux disease) No date: Hyperlipidemia No date: Hypertension No date: Non compliance w medication regimen 08/2022: Osteomyelitis ([place])     Comment:  great toe of right foot No date: Osteomyelitis of left foot ([place]) No date: Secondary hyperparathyroidism of renal origin [facility])     Comment:  per nephrology lov dr  Candiss Norse 05-22-2022 2016: Stroke ([place])     Comment:  no deficits  Reproductive/Obstetrics                              Anesthesia Physical Anesthesia Plan  ASA: 3  Anesthesia Plan: General   Post-op Pain Management: Ofirmev IV (intra-op)*   Induction: Intravenous  PONV Risk Score and Plan: 2 and Ondansetron, Dexamethasone, Midazolam and Treatment may vary due to age or medical condition  Airway Management Planned: LMA and Oral ETT  Additional Equipment: None  Intra-op Plan:   Post-operative Plan: Extubation in OR  Informed Consent: I have reviewed the patients History and Physical, chart, labs and discussed the procedure including the risks, benefits and alternatives for the proposed anesthesia with the patient or authorized representative who has indicated his/her understanding and acceptance.     Dental advisory given  Plan Discussed with: CRNA  Anesthesia Plan Comments: (LMA vs GETA Discussed risks of anesthesia with patient, including PONV, sore throat, lip/dental/eye damage. Rare risks discussed as well, such as cardiorespiratory and neurological sequelae, and allergic reactions. Discussed the role of CRNA in patient's perioperative care. Patient understands.)        Anesthesia Quick Evaluation

## 2022-10-20 NOTE — Discharge Instructions (Signed)

## 2022-10-20 NOTE — Anesthesia Postprocedure Evaluation (Signed)
Anesthesia Post Note  Patient: [name]  Procedure(s) Performed: INSERTION OF ARTERIOVENOUS (AV) GORE-TEX GRAFT ARM ( BRACHIAL AXILLARY) (Left)  Patient location during evaluation: PACU Anesthesia Type: General Level of consciousness: awake and alert Pain management: pain level controlled Vital Signs Assessment: post-procedure vital signs reviewed and stable Respiratory status: spontaneous breathing, nonlabored ventilation, respiratory function stable and patient connected to nasal cannula oxygen Cardiovascular status: blood pressure returned to baseline and stable Postop Assessment: no apparent nausea or vomiting Anesthetic complications: no   No notable events documented.   Last Vitals:  Vitals:   10/20/22 1045 10/20/22 1050  BP: (!) 150/71 (!) 146/66  Pulse: 96 96  Resp: 14 16  Temp: 36.6 C (!) 36.1 C  SpO2: 100% 100%    Last Pain:  Vitals:   10/20/22 1050  TempSrc: Temporal  PainSc: 0-No pain                 Arita Miss

## 2022-10-23 ENCOUNTER — Encounter (HOSPITAL_BASED_OUTPATIENT_CLINIC_OR_DEPARTMENT_OTHER): Payer: No Typology Code available for payment source | Admitting: General Surgery

## 2022-10-23 ENCOUNTER — Encounter: Payer: Self-pay | Admitting: Vascular Surgery

## 2022-10-24 ENCOUNTER — Encounter (HOSPITAL_BASED_OUTPATIENT_CLINIC_OR_DEPARTMENT_OTHER): Payer: No Typology Code available for payment source | Admitting: General Surgery

## 2022-10-25 ENCOUNTER — Encounter (HOSPITAL_BASED_OUTPATIENT_CLINIC_OR_DEPARTMENT_OTHER): Payer: No Typology Code available for payment source | Admitting: General Surgery

## 2022-10-26 ENCOUNTER — Encounter (HOSPITAL_BASED_OUTPATIENT_CLINIC_OR_DEPARTMENT_OTHER): Payer: No Typology Code available for payment source | Admitting: General Surgery

## 2022-10-26 DIAGNOSIS — L97514 Non-pressure chronic ulcer of other 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: Secondary | ICD-10-CM | POA: Diagnosis not present

## 2022-10-26 DIAGNOSIS — E11621 Type 2 diabetes mellitus with foot ulcer: Secondary | ICD-10-CM | POA: Diagnosis not present

## 2022-10-26 NOTE — Progress Notes (Signed)
MARIAN, GRANDT ([id number]) [id number] Page 1 of 7 Visit Report for 10/26/2022 Arrival Information Details Patient Name: Date of Service: [name], [name] 10/26/2022 12:30 PM Medical Record Number: [id number] Patient Account Number: [id number] Date of Birth/Sex: Treating RN: [birth year] (72 y.o. [name] Primary Care Waynesha Rammel: [name] Other Clinician: Referring Jaimie Pippins: Treating Jessicca Stitzer/Extender: [name] Weeks in Treatment: 2 Visit Information History Since Last Visit Added or deleted any medications: No Patient Arrived: [name] Any new allergies or adverse reactions: No Arrival Time: 12:47 Had a fall or experienced change in No Accompanied By: self activities of daily living that may affect Transfer Assistance: None risk of falls: Patient Identification Verified: Yes Signs or symptoms of abuse/neglect since last visito No Secondary Verification Process Completed: Yes Hospitalized since last visit: Yes Patient Requires Transmission-Based Precautions: No Implantable device outside of the clinic excluding No Patient Has Alerts: No cellular tissue based products placed in the center since last visit: Has Dressing in Place as Prescribed: Yes Pain Present Now: No Electronic Signature(s) Signed: 10/26/2022 4:03:34 PM By: Baruch Gouty RN, BSN Entered By: Baruch Gouty on 10/26/2022 12:48:14 -------------------------------------------------------------------------------- Encounter Discharge Information Details Patient Name: Date of Service: [facility], [name] 10/26/2022 12:30 PM Medical Record Number: [id number] Patient Account Number: [id number] Date of Birth/Sex: Treating RN: [birth year] (72 y.o. [name] Primary Care Prudy Candy: [name] Other Clinician: Referring Ashunti Schofield: Treating Castella Lerner/Extender: [name] Weeks in Treatment: 2 Encounter Discharge Information Items Post Procedure  Vitals Discharge Condition: Stable Temperature (F): 97.8 Ambulatory Status: Cane Pulse (bpm): 96 Discharge Destination: Home Respiratory Rate (breaths/min): 18 Transportation: Private Auto Blood Pressure (mmHg): 165/79 Accompanied By: self Schedule Follow-up Appointment: Yes Clinical Summary of Care: Patient Declined Electronic Signature(s) Signed: 10/26/2022 4:03:34 PM By: Baruch Gouty RN, BSN Entered By: Baruch Gouty on 10/26/2022 13:23:42 Gunderson, [name] ([id number]) [id number] Page 2 of 7 -------------------------------------------------------------------------------- Lower Extremity Assessment Details Patient Name: Date of ServiceJodene [name], [name] 10/26/2022 12:30 PM Medical Record Number: [id number] Patient Account Number: [id number] Date of Birth/Sex: Treating RN: 21-May-1951 (72 y.o. [name] Primary Care Iris Hairston: [name] Other Clinician: Referring Xian Apostol: Treating Javari Bufkin/Extender: [name] Weeks in Treatment: 2 Edema Assessment Assessed: [Left: No] [Right: No] Edema: [Left: N] [Right: o] Calf Left: Right: Point of Measurement: From Medial Instep 37.5 cm Ankle Left: Right: Point of Measurement: From Medial Instep 23.5 cm Vascular Assessment Pulses: Dorsalis Pedis Palpable: [Right:Yes] Electronic Signature(s) Signed: 10/26/2022 4:03:34 PM By: Baruch Gouty RN, BSN Entered By: Baruch Gouty on 10/26/2022 12:53:18 -------------------------------------------------------------------------------- Multi Wound Chart Details Patient Name: Date of Service: [facility], [name] 10/26/2022 12:30 PM Medical Record Number: [id number] Patient Account Number: [id number] Date of Birth/Sex: Treating RN: 12-13-1950 (72 y.o. M) Primary Care Odester Nilson: [name] Other Clinician: Referring Latriece Anstine: Treating Magdeline Prange/Extender: [name] Weeks in Treatment: 2 Vital Signs Height(in):  72 Pulse(bpm): 96 Weight(lbs): 185 Blood Pressure(mmHg): 165/79 Body Mass Index(BMI): 25.1 Temperature(F): 97.8 Respiratory Rate(breaths/min): 18 [1:Photos:] [N/A:N/A] Right, Medial T Great oe N/A N/A Wound Location: Pressure Injury N/A N/A Wounding Event: Diabetic Wound/Ulcer of the Lower N/A N/A Primary Etiology: Extremity Acute Osteomyelitis N/A N/A Secondary Etiology: Cataracts, Hypertension, Type II N/A N/A Comorbid History: Diabetes, Osteomyelitis, Neuropathy, Confinement Anxiety 08/16/2022 N/A N/A Date Acquired: 2 N/A N/A Weeks of Treatment: Open N/A N/A Wound Status: No N/A N/A Wound Recurrence: 1.7x1.1x0.5 N/A N/A Measurements L x W x D (cm) 1.469 N/A N/A A (cm) : rea 0.734 N/A N/A Volume (cm) : -  434.20% N/A N/A % Reduction in A rea: -567.30% N/A N/A % Reduction in Volume: Grade 3 N/A N/A Classification: Medium N/A N/A Exudate A mount: Serosanguineous N/A N/A Exudate Type: red, brown N/A N/A Exudate Color: Distinct, outline attached N/A N/A Wound Margin: Small (1-33%) N/A N/A Granulation A mount: Red N/A N/A Granulation Quality: Large (67-100%) N/A N/A Necrotic A mount: Fat Layer (Subcutaneous Tissue): Yes N/A N/A Exposed Structures: Tendon: Yes Fascia: No Muscle: No Joint: No Bone: No None N/A N/A Epithelialization: Debridement - Excisional N/A N/A Debridement: Pre-procedure Verification/Time Out 13:00 N/A N/A Taken: Lidocaine 5% topical ointment N/A N/A Pain Control: Tendon, Subcutaneous, Slough N/A N/A Tissue Debrided: Skin/Subcutaneous Tissue/Muscle N/A N/A Level: 1.7 N/A N/A Debridement A (sq cm): rea Curette, Forceps, Scissors N/A N/A Instrument: Minimum N/A N/A Bleeding: Pressure N/A N/A Hemostasis A chieved: 0 N/A N/A Procedural Pain: 0 N/A N/A Post Procedural Pain: Procedure was tolerated well N/A N/A Debridement Treatment Response: 1.7x1.1x0.5 N/A N/A Post Debridement Measurements L x W x D (cm) 0.734  N/A N/A Post Debridement Volume: (cm) Callus: Yes N/A N/A Periwound Skin Texture: Maceration: Yes N/A N/A Periwound Skin Moisture: No Abnormalities Noted N/A N/A Periwound Skin Color: No Abnormality N/A N/A Temperature: Debridement N/A N/A Procedures Performed: Treatment Notes Electronic Signature(s) Signed: 10/26/2022 1:08:10 PM By: Fredirick Maudlin MD FACS Entered By: Fredirick Maudlin on 10/26/2022 13:08:10 -------------------------------------------------------------------------------- Multi-Disciplinary Care Plan Details Patient Name: Date of Service: [facility], [name] 10/26/2022 12:30 PM Medical Record Number: [id number] Patient Account Number: [id number] Date of Birth/Sex: Treating RN: January 23, 1951 (72 y.o. [name] Primary Care Creedence Heiss: [name] Other Clinician: Referring Burnham Trost: Treating Dael Howland/Extender: [name] Weeks in Treatment: 2 Multidisciplinary Care Plan reviewed with physician ABHIJAY, MORRISS ([id number]) [id number] Page 4 of 7 Active Inactive Nutrition Nursing Diagnoses: Impaired glucose control: actual or potential Goals: Patient/caregiver will maintain therapeutic glucose control Date Initiated: 10/12/2022 Target Resolution Date: 12/28/2022 Goal Status: Active Interventions: Provide education on elevated blood sugars and impact on wound healing Provide education on nutrition Treatment Activities: Dietary management education, guidance and counseling : 10/12/2022 Giving encouragement to exercise : 10/12/2022 Notes: Osteomyelitis Nursing Diagnoses: Infection: osteomyelitis Goals: Patient/caregiver will verbalize understanding of disease process and disease management Date Initiated: 10/12/2022 Target Resolution Date: 11/29/2022 Goal Status: Active Interventions: Assess for signs and symptoms of osteomyelitis resolution every visit Provide education on osteomyelitis Treatment Activities: MRI  : 08/16/2022 Notes: Wound/Skin Impairment Nursing Diagnoses: Impaired tissue integrity Goals: Ulcer/skin breakdown will have a volume reduction of 30% by week 4 Date Initiated: 10/12/2022 Target Resolution Date: 11/30/2022 Goal Status: Active Interventions: Assess ulceration(s) every visit Provide education on smoking Treatment Activities: Skin care regimen initiated : 10/12/2022 Smoking cessation education : 10/12/2022 Notes: Electronic Signature(s) Signed: 10/26/2022 4:03:34 PM By: Baruch Gouty RN, BSN Entered By: Baruch Gouty on 10/26/2022 12:54:54 -------------------------------------------------------------------------------- Pain Assessment Details Patient Name: Date of Service: [name], [name] 10/26/2022 12:30 PM Medical Record Number: [id number] Patient Account Number: [id number] CIPRIANO, MILLIKAN ([id number]) [id number] Page 5 of 7 Date of Birth/Sex: Treating RN: 12-20-1950 (72 y.o. [name] Primary Care Hailey Miles: Other Clinician: [name] Referring Lael Wetherbee: Treating Aubrey Blackard/Extender: [name] Weeks in Treatment: 2 Active Problems Location of Pain Severity and Description of Pain Patient Has Paino No Site Locations Rate the pain. Current Pain Level: 0 Pain Management and Medication Current Pain Management: Electronic Signature(s) Signed: 10/26/2022 4:03:34 PM By: Baruch Gouty RN, BSN Entered By: Baruch Gouty on 10/26/2022 12:49:06 -------------------------------------------------------------------------------- Patient/Caregiver Education Details Patient Name:  Date of Service: [name], [name] 1/25/2024andnbsp12:30 PM Medical Record Number: [id number] Patient Account Number: [id number] Date of Birth/Gender: Treating RN: 07-24-1951 (72 y.o. [name] Primary Care Physician: [name] Other Clinician: Referring Physician: Treating Physician/Extender: Levin Erp  in Treatment: 2 Education Assessment Education Provided To: Patient Education Topics Provided Elevated Blood Sugar/ Impact on Healing: Methods: Explain/Verbal Responses: Reinforcements needed, State content correctly Offloading: Methods: Explain/Verbal Responses: Reinforcements needed, State content correctly Wound/Skin Impairment: Methods: Explain/Verbal Responses: Reinforcements needed, State content correctly [place], [name] ([id number]) [id number] Page 6 of 7 Electronic Signature(s) Signed: 10/26/2022 4:03:34 PM By: Baruch Gouty RN, BSN Entered By: Baruch Gouty on 10/26/2022 12:55:28 -------------------------------------------------------------------------------- Wound Assessment Details Patient Name: Date of Service: [facility], [name] 10/26/2022 12:30 PM Medical Record Number: [id number] Patient Account Number: [id number] Date of Birth/Sex: Treating RN: 06-Nov-1950 (72 y.o. [name] Primary Care Jocelyne Reinertsen: [name] Other Clinician: Referring Donita Newland: Treating Fatou Dunnigan/Extender: [name] Weeks in Treatment: 2 Wound Status Wound Number: 1 Primary Diabetic Wound/Ulcer of the Lower Extremity Etiology: Wound Location: Right, Medial T Great oe Secondary Acute Osteomyelitis Wounding Event: Pressure Injury Etiology: Date Acquired: 08/16/2022 Wound Open Weeks Of Treatment: 2 Status: Clustered Wound: No Comorbid Cataracts, Hypertension, Type II Diabetes, Osteomyelitis, History: Neuropathy, Confinement Anxiety Photos Wound Measurements Length: (cm) 1.7 Width: (cm) 1.1 Depth: (cm) 0.5 Area: (cm) 1.469 Volume: (cm) 0.734 % Reduction in Area: -434.2% % Reduction in Volume: -567.3% Epithelialization: None Tunneling: No Undermining: No Wound Description Classification: Grade 3 Wound Margin: Distinct, outline attached Exudate Amount: Medium Exudate Type: Serosanguineous Exudate Color: red, brown Foul Odor  After Cleansing: No Slough/Fibrino Yes Wound Bed Granulation Amount: Small (1-33%) Exposed Structure Granulation Quality: Red Fascia Exposed: No Necrotic Amount: Large (67-100%) Fat Layer (Subcutaneous Tissue) Exposed: Yes Necrotic Quality: Adherent Slough Tendon Exposed: Yes Muscle Exposed: No Joint Exposed: No Bone Exposed: No Periwound Skin Texture Texture Color No Abnormalities Noted: Yes No Abnormalities Noted: Yes Moisture Temperature / Pain No Abnormalities Noted: No Temperature: No Abnormality Maceration: Yes Zawistowski, Jaycub ([id number]) [id number] Page 7 of 7 Treatment Notes Wound #1 (Toe Great) Wound Laterality: Right, Medial Cleanser Soap and Water Discharge Instruction: May shower and wash wound with dial antibacterial soap and water prior to dressing change. Peri-Wound Care Sween Lotion (Moisturizing lotion) Discharge Instruction: Apply moisturizing lotion as directed Topical Primary Dressing Sorbalgon AG Dressing, 4x4 (in/in) Discharge Instruction: Apply to wound bed as instructed Secondary Dressing Optifoam Non-Adhesive Dressing, 4x4 in Discharge Instruction: Apply over primary dressing as directed. Woven Gauze Sponges 2x2 in Discharge Instruction: Apply over primary dressing as directed. Secured With Conforming Stretch Gauze Bandage, Sterile 2x75 (in/in) Discharge Instruction: Secure with stretch gauze as directed. Compression Wrap Compression Stockings Add-Ons Electronic Signature(s) Signed: 10/26/2022 4:03:34 PM By: Baruch Gouty RN, BSN Entered By: Baruch Gouty on 10/26/2022 12:56:53 -------------------------------------------------------------------------------- Vitals Details Patient Name: Date of Service: [name], [name] 10/26/2022 12:30 PM Medical Record Number: [id number] Patient Account Number: [id number] Date of Birth/Sex: Treating RN: Oct 08, 1950 (72 y.o. [name] Primary Care Jawann Urbani: [name]  Other Clinician: Referring Corrin Sieling: Treating Iyanna Drummer/Extender: [name] Weeks in Treatment: 2 Vital Signs Time Taken: 12:48 Temperature (F): 97.8 Height (in): 72 Pulse (bpm): 96 Weight (lbs): 185 Respiratory Rate (breaths/min): 18 Body Mass Index (BMI): 25.1 Blood Pressure (mmHg): 165/79 Reference Range: 80 - 120 mg / dl Notes pt did not check blood sugar this am Electronic Signature(s) Signed: 10/26/2022 4:03:34 PM By: Baruch Gouty RN, BSN Entered By:  Baruch Gouty on 10/26/2022 12:48:57

## 2022-10-27 ENCOUNTER — Encounter (HOSPITAL_BASED_OUTPATIENT_CLINIC_OR_DEPARTMENT_OTHER): Payer: No Typology Code available for payment source | Admitting: General Surgery

## 2022-10-27 NOTE — Progress Notes (Signed)
[name] ([id number]) [id number] Page 1 of 9 Visit Report for 10/26/2022 Chief Complaint Document Details Patient Name: Date of Service: [name], [name] 10/26/2022 12:30 PM Medical Record Number: [id number] Patient Account Number: [id number] Date of Birth/Sex: Treating RN: 1951-09-09 (72 y.o. M) Primary Care Provider: [name] Other Clinician: Referring Provider: Treating Provider/Extender: [name] Weeks in Treatment: 2 Information Obtained from: Patient Chief Complaint Patients presents for treatment of an open diabetic ulcer on the right great toe Electronic Signature(s) Signed: 10/26/2022 1:08:16 PM By: Fredirick Maudlin MD FACS Entered By: Fredirick Maudlin on 10/26/2022 13:08:16 -------------------------------------------------------------------------------- Debridement Details Patient Name: Date of Service: [name] [name] 10/26/2022 12:30 PM Medical Record Number: [id number] Patient Account Number: [id number] Date of Birth/Sex: Treating RN: May 11, 1951 (72 y.o. [name] Primary Care Provider: [name] Other Clinician: Referring Provider: Treating Provider/Extender: [name] Weeks in Treatment: 2 Debridement Performed for Assessment: Wound #1 Right,Medial T Great oe Performed By: Physician Fredirick Maudlin, MD Debridement Type: Debridement Severity of Tissue Pre Debridement: Necrosis of bone Level of Consciousness (Pre-procedure): Awake and Alert Pre-procedure Verification/Time Out Yes - 13:00 Taken: Start Time: 13:01 Pain Control: Lidocaine 5% topical ointment T Area Debrided (L x W): otal 1.7 (cm) x 1 (cm) = 1.7 (cm) Tissue and other material debrided: Viable, Non-Viable, Slough, Subcutaneous, Tendon, Skin: Epidermis, Slough Level: Skin/Subcutaneous Tissue/Muscle Debridement Description: Excisional Instrument: Curette, Forceps, Scissors Bleeding: Minimum Hemostasis Achieved:  Pressure Procedural Pain: 0 Post Procedural Pain: 0 Response to Treatment: Procedure was tolerated well Level of Consciousness (Post- Awake and Alert procedure): Post Debridement Measurements of Total Wound Length: (cm) 1.7 Width: (cm) 1.1 Depth: (cm) 0.5 Volume: (cm) 0.734 Character of Wound/Ulcer Post Debridement: Requires Further Debridement Severity of Tissue Post Debridement: Necrosis of bone [name], [name] ([id number]) [id number] Page 2 of 9 Post Procedure Diagnosis Same as Pre-procedure Notes scribed by [name] Gouty, RN for Dr. Celine Ahr Electronic Signature(s) Signed: 10/26/2022 1:13:05 PM By: Fredirick Maudlin MD FACS Signed: 10/26/2022 4:03:34 PM By: [name] Gouty RN, BSN Entered By: [name] on 10/26/2022 13:06:48 -------------------------------------------------------------------------------- HPI Details Patient Name: Date of Service: [name] [name] 10/26/2022 12:30 PM Medical Record Number: [id number] Patient Account Number: [id number] Date of Birth/Sex: Treating RN: 1951-04-26 (72 y.o. M) Primary Care Provider: [name] Other Clinician: Referring Provider: Treating Provider/Extender: [name] Weeks in Treatment: 2 History of Present Illness HPI Description: ADMISSION 10/12/2022 This is a 72 year old type II diabetic (last hemoglobin A1c 7.7 in October 2023) with stage V chronic kidney disease and hypertension. He has been followed by podiatry for an ulcer on his right great toe. He has a history of prior toe and foot infections requiring amputation. In November 2023, he had an outpatient MRI that demonstrated osteomyelitis. He was admitted to the hospital for IV antibiotics and subsequently underwent irrigation and debridement of the toe with biopsy and placement of married 3 layer matrix. Cultures grew out MRSA and osteomyelitis was seen on pathology. He was followed by infectious disease and given a course  of doxycycline and Augmentin. He completed this course at the end of December. He has been evaluated by vascular surgery with formal ABIs as well as consultation. He does have adequate blood flow at least to the dorsalis pedis. He last saw podiatry on January 4 of this year where he underwent debridement. It was felt he would benefit from specialized wound care and he was referred to our center for further evaluation and management. On the dorsal aspect  of his right great toe, there is a circular ulcer. There is a fair amount of undermining from callus and dry skin. The surface is a bit pale and fibrotic, but there is no obvious necrosis. 10/19/2022: He has been approved by his insurance to undergo hyperbaric oxygen therapy. EKG has been obtained and is normal. We are still waiting on his chest x-ray. The wound is clean with just a little bit of periwound callus and slough on the surface. Tendon is exposed. 10/26/2022: Unfortunately, the required co-pay from his insurance is cost prohibitive for him and he will not be able to receive hyperbaric oxygen therapy. Some moisture got under the dry skin around his wound and lifted it off but there is good epithelialized tissue beneath this. The exposed tendon has deteriorated. Electronic Signature(s) Signed: 10/26/2022 1:09:18 PM By: Fredirick Maudlin MD FACS Entered By: Fredirick Maudlin on 10/26/2022 13:09:17 -------------------------------------------------------------------------------- Physical Exam Details Patient Name: Date of Service: [name] [name] 10/26/2022 12:30 PM Medical Record Number: [id number] Patient Account Number: [id number] Date of Birth/Sex: Treating RN: 1950-12-29 (72 y.o. M) Primary Care Provider: [name] Other Clinician: Referring Provider: Treating Provider/Extender: [name] Weeks in Treatment: 2 Constitutional Hypertensive, asymptomatic. . . . no acute distress. Respiratory Normal work of breathing on  room air. [name] ([id number]) [id number] Page 3 of 9 Notes 10/26/2022: Some moisture got under the dry skin around his wound and lifted it off but there is good epithelialized tissue beneath this. The exposed tendon has deteriorated. Electronic Signature(s) Signed: 10/26/2022 1:10:39 PM By: Fredirick Maudlin MD FACS Entered By: Fredirick Maudlin on 10/26/2022 13:10:38 -------------------------------------------------------------------------------- Physician Orders Details Patient Name: Date of Service: [name], [name] [name] 10/26/2022 12:30 PM Medical Record Number: [id number] Patient Account Number: [id number] Date of Birth/Sex: Treating RN: 27-Aug-1951 (72 y.o. [name] Primary Care Provider: [name] Other Clinician: Referring Provider: Treating Provider/Extender: [name] Weeks in Treatment: 2 Verbal / Phone Orders: No Diagnosis Coding ICD-10 Coding Code Description [phone] Non-pressure chronic ulcer of other part of right foot with fat layer exposed E11.621 Type 2 diabetes mellitus with foot ulcer N18.5 Chronic kidney disease, stage 5 M86.9 Osteomyelitis, unspecified E11.42 Type 2 diabetes mellitus with diabetic polyneuropathy I10 Essential (primary) hypertension Follow-up Appointments ppointment in 1 week. - Dr. Celine Ahr Rm 3 Return A Friday 2/2 @ 12:45 pm Anesthetic Wound #1 Right,Medial T Great oe (In clinic) Topical Lidocaine 5% applied to wound bed - prior to debridement Bathing/ Shower/ Hygiene May shower and wash wound with soap and water. Off-Loading Wound #1 Right,Medial T Great oe Other: - Diabetic shoe Wound Treatment Wound #1 - T Great oe Wound Laterality: Right, Medial Cleanser: Soap and Water 1 x Per Day/30 Days Discharge Instructions: May shower and wash wound with dial antibacterial soap and water prior to dressing change. Peri-Wound Care: Sween Lotion (Moisturizing lotion) 1 x Per Day/30  Days Discharge Instructions: Apply moisturizing lotion as directed Prim Dressing: [place] 2x2 (in/in) (DME) (Generic) 1 x Per Day/30 Days ary Discharge Instructions: Apply to wound bed as instructed Secondary Dressing: Optifoam Non-Adhesive Dressing, 4x4 in (DME) (Generic) 1 x Per Day/30 Days Discharge Instructions: Apply over primary dressing as directed. Secondary Dressing: Woven Gauze Sponges 2x2 in (DME) (Generic) 1 x Per Day/30 Days Discharge Instructions: Apply over primary dressing as directed. Secured With: Child psychotherapist, Sterile 2x75 (in/in) (DME) (Generic) 1 x Per Day/30 Days Discharge Instructions: Secure with stretch gauze as directed. [name], [name] ([id number]) [id number] Page  4 of 9 Secured With: Paper Tape, 2x10 (in/yd) (DME) (Generic) 1 x Per Day/30 Days Discharge Instructions: Secure dressing with tape as directed. Patient Medications llergies: losartan potassium, sildenafil A Notifications Medication Indication Start End prior to debridement 10/26/2022 lidocaine DOSE topical 5 % ointment - ointment topical Electronic Signature(s) Signed: 10/26/2022 4:03:34 PM By: [name] Gouty RN, BSN Signed: 10/27/2022 7:39:53 AM By: Fredirick Maudlin MD FACS Previous Signature: 10/26/2022 3:11:58 PM Version By: Fredirick Maudlin MD FACS Previous Signature: 10/26/2022 1:13:05 PM Version By: Fredirick Maudlin MD FACS Entered By: [name] on 10/26/2022 15:55:38 -------------------------------------------------------------------------------- Problem List Details Patient Name: Date of Service: [facility], [name] [name] 10/26/2022 12:30 PM Medical Record Number: [id number] Patient Account Number: [id number] Date of Birth/Sex: Treating RN: 09/30/1951 (72 y.o. [name] Primary Care Provider: [name] Other Clinician: Referring Provider: Treating Provider/Extender: [name] Weeks in Treatment:  2 Active Problems ICD-10 Encounter Code Description Active Date MDM Diagnosis L97.512 Non-pressure chronic ulcer of other part of right foot with fat layer exposed 10/12/2022 No Yes E11.621 Type 2 diabetes mellitus with foot ulcer 10/12/2022 No Yes N18.5 Chronic kidney disease, stage 5 10/12/2022 No Yes M86.9 Osteomyelitis, unspecified 10/12/2022 No Yes E11.42 Type 2 diabetes mellitus with diabetic polyneuropathy 10/12/2022 No Yes I10 Essential (primary) hypertension 10/12/2022 No Yes Inactive Problems Resolved Problems Electronic Signature(s) Signed: 10/26/2022 1:07:39 PM By: Fredirick Maudlin MD FACS [name], [name] ([id number]) [id number] Page 5 of 9 Entered By: Fredirick Maudlin on 10/26/2022 13:07:39 -------------------------------------------------------------------------------- Progress Note Details Patient Name: Date of Service: [name], [name] 10/26/2022 12:30 PM Medical Record Number: [id number] Patient Account Number: [id number] Date of Birth/Sex: Treating RN: 11-23-1950 (72 y.o. M) Primary Care Provider: [name] Other Clinician: Referring Provider: Treating Provider/Extender: [name] Weeks in Treatment: 2 Subjective Chief Complaint Information obtained from Patient Patients presents for treatment of an open diabetic ulcer on the right great toe History of Present Illness (HPI) ADMISSION 10/12/2022 This is a 72 year old type II diabetic (last hemoglobin A1c 7.7 in October 2023) with stage V chronic kidney disease and hypertension. He has been followed by podiatry for an ulcer on his right great toe. He has a history of prior toe and foot infections requiring amputation. In November 2023, he had an outpatient MRI that demonstrated osteomyelitis. He was admitted to the hospital for IV antibiotics and subsequently underwent irrigation and debridement of the toe with biopsy and placement of married 3 layer matrix. Cultures grew  out MRSA and osteomyelitis was seen on pathology. He was followed by infectious disease and given a course of doxycycline and Augmentin. He completed this course at the end of December. He has been evaluated by vascular surgery with formal ABIs as well as consultation. He does have adequate blood flow at least to the dorsalis pedis. He last saw podiatry on January 4 of this year where he underwent debridement. It was felt he would benefit from specialized wound care and he was referred to our center for further evaluation and management. On the dorsal aspect of his right great toe, there is a circular ulcer. There is a fair amount of undermining from callus and dry skin. The surface is a bit pale and fibrotic, but there is no obvious necrosis. 10/19/2022: He has been approved by his insurance to undergo hyperbaric oxygen therapy. EKG has been obtained and is normal. We are still waiting on his chest x-ray. The wound is clean with just a little bit of periwound callus and slough on the  surface. Tendon is exposed. 10/26/2022: Unfortunately, the required co-pay from his insurance is cost prohibitive for him and he will not be able to receive hyperbaric oxygen therapy. Some moisture got under the dry skin around his wound and lifted it off but there is good epithelialized tissue beneath this. The exposed tendon has deteriorated. Patient History Information obtained from Patient. Family History Diabetes - Mother,Siblings, Heart Disease - Siblings, Hypertension - Siblings, Seizures - Father, Stroke - Siblings, No family history of Cancer, Hereditary Spherocytosis, Kidney Disease, Lung Disease, Thyroid Problems, Tuberculosis. Social History Former smoker, Marital Status - Married, Alcohol Use - Rarely, Drug Use - No History, Caffeine Use - Daily. Medical History Eyes Patient has history of Cataracts - rt eye Ear/Nose/Mouth/Throat Denies history of Chronic sinus  problems/congestion Hematologic/Lymphatic Denies history of Anemia, Hemophilia, Human Immunodeficiency Virus, Lymphedema, Sickle Cell Disease Respiratory Denies history of Aspiration, Asthma, Chronic Obstructive Pulmonary Disease (COPD), Pneumothorax, Tuberculosis Cardiovascular Patient has history of Hypertension Gastrointestinal Denies history of Cirrhosis , Colitis, Crohnoos, Hepatitis A, Hepatitis B, Hepatitis C Endocrine Patient has history of Type II Diabetes Genitourinary Denies history of End Stage Renal Disease Immunological Denies history of Lupus Erythematosus, Raynaudoos, Scleroderma Integumentary (Skin) Denies history of History of Burn Musculoskeletal Patient has history of Osteomyelitis - MRI confirmed (08/16/22) Denies history of Gout Neurologic Patient has history of Neuropathy Psychiatric Patient has history of Confinement Anxiety Denies history of Anorexia/bulimia Hospitalization/Surgery History - AV fistula place left upper arm 10/20/22. [name], [name] ([id number]) [id number] Page 6 of 9 Medical A Surgical History Notes nd Genitourinary CKD IV Objective Constitutional Hypertensive, asymptomatic. no acute distress. Vitals Time Taken: 12:48 PM, Height: 72 in, Weight: 185 lbs, BMI: 25.1, Temperature: 97.8 F, Pulse: 96 bpm, Respiratory Rate: 18 breaths/min, Blood Pressure: 165/79 mmHg. General Notes: pt did not check blood sugar this am Respiratory Normal work of breathing on room air. General Notes: 10/26/2022: Some moisture got under the dry skin around his wound and lifted it off but there is good epithelialized tissue beneath this. The exposed tendon has deteriorated. Integumentary (Hair, Skin) Wound #1 status is Open. Original cause of wound was Pressure Injury. The date acquired was: 08/16/2022. The wound has been in treatment 2 weeks. The wound is located on the Right,Medial T Great. The wound measures 1.7cm length x  1.1cm width x 0.5cm depth; 1.469cm^2 area and 0.734cm^3 volume. oe There is tendon and Fat Layer (Subcutaneous Tissue) exposed. There is no tunneling or undermining noted. There is a medium amount of serosanguineous drainage noted. The wound margin is distinct with the outline attached to the wound base. There is small (1-33%) red granulation within the wound bed. There is a large (67-100%) amount of necrotic tissue within the wound bed including Adherent Slough. The periwound skin appearance had no abnormalities noted for texture. The periwound skin appearance had no abnormalities noted for color. The periwound skin appearance exhibited: Maceration. Periwound temperature was noted as No Abnormality. Assessment Active Problems ICD-10 Non-pressure chronic ulcer of other part of right foot with fat layer exposed Type 2 diabetes mellitus with foot ulcer Chronic kidney disease, stage 5 Osteomyelitis, unspecified Type 2 diabetes mellitus with diabetic polyneuropathy Essential (primary) hypertension Procedures Wound #1 Pre-procedure diagnosis of Wound #1 is a Diabetic Wound/Ulcer of the Lower Extremity located on the Right,Medial T Great .Severity of Tissue Pre oe Debridement is: Necrosis of bone. There was a Excisional Skin/Subcutaneous Tissue/Muscle Debridement with a total area of 1.7 sq cm performed by Fredirick Maudlin, MD. With the following instrument(s):  Curette, Forceps, and Scissors to remove Viable and Non-Viable tissue/material. Material removed includes Tendon, Subcutaneous Tissue, Slough, and Skin: Epidermis after achieving pain control using Lidocaine 5% topical ointment. No specimens were taken. A time out was conducted at 13:00, prior to the start of the procedure. A Minimum amount of bleeding was controlled with Pressure. The procedure was tolerated well with a pain level of 0 throughout and a pain level of 0 following the procedure. Post Debridement Measurements: 1.7cm length x  1.1cm width x 0.5cm depth; 0.734cm^3 volume. Character of Wound/Ulcer Post Debridement requires further debridement. Severity of Tissue Post Debridement is: Necrosis of bone. Post procedure Diagnosis Wound #1: Same as Pre-Procedure General Notes: scribed by [name] Gouty, RN for Dr. Celine Ahr. Plan Follow-up Appointments: Return Appointment in 1 week. - Dr. Celine Ahr Rm 3 Friday 2/2 @ 12:45 pm Anesthetic: Wound #1 Right,Medial T Great: oe (In clinic) Topical Lidocaine 5% applied to wound bed - prior to debridement Bathing/ Shower/ Hygiene: May shower and wash wound with soap and water. [name], [name] ([id number]) [id number] Page 7 of 9 Off-Loading: Wound #1 Right,Medial T Great: oe Other: - Diabetic shoe The following medication(s) was prescribed: lidocaine topical 5 % ointment ointment topical for prior to debridement was prescribed at facility WOUND #1: - T Great Wound Laterality: Right, Medial oe Cleanser: Soap and Water 1 x Per Day/30 Days Discharge Instructions: May shower and wash wound with dial antibacterial soap and water prior to dressing change. Peri-Wound Care: Sween Lotion (Moisturizing lotion) 1 x Per Day/30 Days Discharge Instructions: Apply moisturizing lotion as directed Prim Dressing: [place] 2x2 (in/in) (DME) (Generic) 1 x Per Day/30 Days ary Discharge Instructions: Apply to wound bed as instructed Secondary Dressing: Optifoam Non-Adhesive Dressing, 4x4 in (DME) (Generic) 1 x Per Day/30 Days Discharge Instructions: Apply over primary dressing as directed. Secondary Dressing: Woven Gauze Sponges 2x2 in (DME) (Generic) 1 x Per Day/30 Days Discharge Instructions: Apply over primary dressing as directed. Secured With: Child psychotherapist, Sterile 2x75 (in/in) (DME) (Generic) 1 x Per Day/30 Days Discharge Instructions: Secure with stretch gauze as directed. Secured With: Paper T ape, 2x10 (in/yd) (DME) (Generic) 1 x  Per Day/30 Days Discharge Instructions: Secure dressing with tape as directed. 10/26/2022: Some moisture got under the dry skin around his wound and lifted it off but there is good epithelialized tissue beneath this. The exposed tendon has deteriorated. I used a curette to debride additional dry skin, subcutaneous tissue and slough from the wound. I used forceps and scissors to debride necrotic tendon. We will continue silver alginate. We are going to speak with our financial aid office to discuss potential options to facilitate hyperbaric oxygen therapy for him. Follow-up with me in 1 week. Electronic Signature(s) Signed: 10/26/2022 4:03:34 PM By: [name] Gouty RN, BSN Signed: 10/27/2022 7:39:53 AM By: Fredirick Maudlin MD FACS Previous Signature: 10/26/2022 2:10:00 PM Version By: Fredirick Maudlin MD FACS Previous Signature: 10/26/2022 1:12:29 PM Version By: Fredirick Maudlin MD FACS Entered By: [name] on 10/26/2022 15:56:02 -------------------------------------------------------------------------------- HxROS Details Patient Name: Date of Service: [name], [name] [name] 10/26/2022 12:30 PM Medical Record Number: [id number] Patient Account Number: [id number] Date of Birth/Sex: Treating RN: 10-Jul-1951 (72 y.o. [name] Primary Care Provider: [name] Other Clinician: Referring Provider: Treating Provider/Extender: [name] Weeks in Treatment: 2 Information Obtained From Patient Eyes Medical History: Positive for: Cataracts - rt eye Ear/Nose/Mouth/Throat Medical History: Negative for: Chronic sinus problems/congestion Hematologic/Lymphatic Medical History: Negative for: Anemia; Hemophilia; Human Immunodeficiency  Virus; Lymphedema; Sickle Cell Disease Respiratory Medical History: Negative for: Aspiration; Asthma; Chronic Obstructive Pulmonary Disease (COPD); Pneumothorax; Tuberculosis Cardiovascular [name], [name] ([id number])  [id number] Page 8 of 9 Medical History: Positive for: Hypertension Gastrointestinal Medical History: Negative for: Cirrhosis ; Colitis; Crohns; Hepatitis A; Hepatitis B; Hepatitis C Endocrine Medical History: Positive for: Type II Diabetes Time with diabetes: 2000 Treated with: Insulin Blood sugar tested every day: Yes Tested : Genitourinary Medical History: Negative for: End Stage Renal Disease Past Medical History Notes: CKD IV Immunological Medical History: Negative for: Lupus Erythematosus; Raynauds; Scleroderma Integumentary (Skin) Medical History: Negative for: History of Burn Musculoskeletal Medical History: Positive for: Osteomyelitis - MRI confirmed (08/16/22) Negative for: Gout Neurologic Medical History: Positive for: Neuropathy Psychiatric Medical History: Positive for: Confinement Anxiety Negative for: Anorexia/bulimia HBO Extended History Items Eyes: Cataracts Immunizations Pneumococcal Vaccine: Received Pneumococcal Vaccination: Yes Received Pneumococcal Vaccination On or After 60th Birthday: Yes Implantable Devices None Hospitalization / Surgery History Type of Hospitalization/Surgery AV fistula place left upper arm 10/20/22 Family and Social History Cancer: No; Diabetes: Yes - Mother,Siblings; Heart Disease: Yes - Siblings; Hereditary Spherocytosis: No; Hypertension: Yes - Siblings; Kidney Disease: No; Lung Disease: No; Seizures: Yes - Father; Stroke: Yes - Siblings; Thyroid Problems: No; Tuberculosis: No; Former smoker; Marital Status - Married; Alcohol Use: Rarely; Drug Use: No History; Caffeine Use: Daily; Financial Concerns: No; Food, Clothing or Shelter Needs: No; Support System Lacking: No; Transportation Concerns: No Engineer, maintenance) Signed: 10/26/2022 1:13:05 PM By: Fredirick Maudlin MD FACS Signed: 10/26/2022 4:03:34 PM By: [name] Gouty RN, BSN [place], Signed: 10/26/2022 4:03:34 PM By: [name] Gouty  RN, BSN [name] ([id number]) [id number] Page 9 of 9 Entered By: Fredirick Maudlin on 10/26/2022 13:09:32 -------------------------------------------------------------------------------- SuperBill Details Patient Name: Date of Service: [name], [name] 10/26/2022 Medical Record Number: [id number] Patient Account Number: [id number] Date of Birth/Sex: Treating RN: [birth year] (72 y.o. M) Primary Care Provider: [name] Other Clinician: Referring Provider: Treating Provider/Extender: [name] Weeks in Treatment: 2 Diagnosis Coding ICD-10 Codes Code Description [phone] Non-pressure chronic ulcer of other part of right foot with fat layer exposed E11.621 Type 2 diabetes mellitus with foot ulcer N18.5 Chronic kidney disease, stage 5 M86.9 Osteomyelitis, unspecified E11.42 Type 2 diabetes mellitus with diabetic polyneuropathy I10 Essential (primary) hypertension Facility Procedures : CPT4 Code: 03491791 Description: 50569 - DEB MUSC/FASCIA 20 SQ CM/< ICD-10 Diagnosis Description L97.512 Non-pressure chronic ulcer of other part of right foot with fat layer exposed Modifier: Quantity: 1 Physician Procedures : CPT4 Code Description Modifier [id number] - WC PHYS LEVEL 3 - EST PT 25 ICD-10 Diagnosis Description L97.512 Non-pressure chronic ulcer of other part of right foot with fat layer exposed E11.621 Type 2 diabetes mellitus with foot ulcer M86.9  Osteomyelitis, unspecified 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

## 2022-10-30 ENCOUNTER — Encounter (HOSPITAL_BASED_OUTPATIENT_CLINIC_OR_DEPARTMENT_OTHER): Payer: No Typology Code available for payment source | Admitting: General Surgery

## 2022-10-30 LAB — GLUCOSE, CAPILLARY
Glucose-Capillary: 131 mg/dL — ABNORMAL HIGH (ref 70–99)
Glucose-Capillary: 88 mg/dL (ref 70–99)
Glucose-Capillary: 99 mg/dL (ref 70–99)

## 2022-10-30 NOTE — Progress Notes (Addendum)
[place], Jamarquis ([id number]) [id number] Page 1 of 4 Visit Report for 10/30/2022 Arrival Information Details Patient Name: Date of Service: [name], [name] 10/30/2022 12:00 PM Medical Record Number: [id number] Patient Account Number: [id number] Date of Birth/Sex: Treating RN: Mar 07, 1951 (72 y.o. [name], [name] Primary Care Aylyn Wenzler: [name] Other Clinician: Valeria Batman Referring Jen Benedict: Treating Lloyd Cullinan/Extender: [name] Weeks in Treatment: 2 Visit Information History Since Last Visit All ordered tests and consults were completed: Yes Patient Arrived: Cane Added or deleted any medications: No Arrival Time: 11:52 Any new allergies or adverse reactions: No Accompanied By: None Had a fall or experienced change in No Transfer Assistance: None activities of daily living that may affect Patient Identification Verified: Yes risk of falls: Secondary Verification Process Completed: Yes Signs or symptoms of abuse/neglect since last visito No Patient Requires Transmission-Based Precautions: No Hospitalized since last visit: No Patient Has Alerts: No Implantable device outside of the clinic excluding No cellular tissue based products placed in the center since last visit: Pain Present Now: No Electronic Signature(s) Signed: 10/30/2022 3:50:59 PM By: Valeria Batman EMT Previous Signature: 10/30/2022 3:33:24 PM Version By: Valeria Batman EMT Entered By: Valeria Batman on 10/30/2022 15:50:59 -------------------------------------------------------------------------------- Clinic Level of Care Assessment Details Patient Name: Date of Service: [facility] [name], [name] 10/30/2022 12:00 PM Medical Record Number: [id number] Patient Account Number: [id number] Date of Birth/Sex: Treating RN: 1951-08-20 (72 y.o. [name] Primary Care Santosha [name]: [name] Other Clinician: Valeria Batman Referring Rowen Hur: Treating Merdith Adan/Extender: [name] Weeks in Treatment: 2 Clinic Level of Care Assessment Items TOOL 4 Quantity Score '[]'$  - 0 Use when only an EandM is performed on FOLLOW-UP visit ASSESSMENTS - Nursing Assessment / Reassessment '[]'$  - 0 Reassessment of Co-morbidities (includes updates in patient status) '[]'$  - 0 Reassessment of Adherence to Treatment Plan ASSESSMENTS - Wound and Skin A ssessment / Reassessment '[]'$  - 0 Simple Wound Assessment / Reassessment - one wound '[]'$  - 0 Complex Wound Assessment / Reassessment - multiple wounds '[]'$  - 0 Dermatologic / Skin Assessment (not related to wound area) ASSESSMENTS - Focused Assessment '[]'$  - 0 Circumferential Edema Measurements - multi extremities '[]'$  - 0 Nutritional Assessment / Counseling / Intervention BRAYCEN, BURANDT ([id number]) [id number] Page 2 of 4 '[]'$  - 0 Lower Extremity Assessment (monofilament, tuning fork, pulses) '[]'$  - 0 Peripheral Arterial Disease Assessment (using hand held doppler) ASSESSMENTS - Ostomy and/or Continence Assessment and Care '[]'$  - 0 Incontinence Assessment and Management '[]'$  - 0 Ostomy Care Assessment and Management (repouching, etc.) PROCESS - Coordination of Care X - Simple Patient / Family Education for ongoing care 1 15 '[]'$  - 0 Complex (extensive) Patient / Family Education for ongoing care '[]'$  - 0 Staff obtains Programmer, systems, Records, T Results / Process Orders est '[]'$  - 0 Staff telephones HHA, Nursing Homes / Clarify orders / etc '[]'$  - 0 Routine Transfer to another Facility (non-emergent condition) '[]'$  - 0 Routine Hospital Admission (non-emergent condition) '[]'$  - 0 New Admissions / Biomedical engineer / Ordering NPWT Apligraf, etc. , '[]'$  - 0 Emergency Hospital Admission (emergent condition) '[]'$  - 0 Simple Discharge Coordination '[]'$  - 0 Complex (extensive) Discharge Coordination PROCESS - Special Needs '[]'$  - 0 Pediatric / Minor Patient Management '[]'$  - 0 Isolation Patient Management '[]'$  -  0 Hearing / Language / Visual special needs '[]'$  - 0 Assessment of Community assistance (transportation, D/C planning, etc.) '[]'$  - 0 Additional assistance / Altered mentation '[]'$  - 0 Support Surface(s) Assessment (bed, cushion, seat, etc.)  INTERVENTIONS - Wound Cleansing / Measurement '[]'$  - 0 Simple Wound Cleansing - one wound '[]'$  - 0 Complex Wound Cleansing - multiple wounds '[]'$  - 0 Wound Imaging (photographs - any number of wounds) '[]'$  - 0 Wound Tracing (instead of photographs) '[]'$  - 0 Simple Wound Measurement - one wound '[]'$  - 0 Complex Wound Measurement - multiple wounds INTERVENTIONS - Wound Dressings '[]'$  - 0 Small Wound Dressing one or multiple wounds '[]'$  - 0 Medium Wound Dressing one or multiple wounds '[]'$  - 0 Large Wound Dressing one or multiple wounds '[]'$  - 0 Application of Medications - topical '[]'$  - 0 Application of Medications - injection INTERVENTIONS - Miscellaneous '[]'$  - 0 External ear exam '[]'$  - 0 Specimen Collection (cultures, biopsies, blood, body fluids, etc.) '[]'$  - 0 Specimen(s) / Culture(s) sent or taken to Lab for analysis '[]'$  - 0 Patient Transfer (multiple staff / Civil Service fast streamer / Similar devices) '[]'$  - 0 Simple Staple / Suture removal (25 or less) '[]'$  - 0 Complex Staple / Suture removal (26 or more) X- 1 10 Hypo / Hyperglycemic Management (close monitor of Blood Glucose) Shakir, Upton ([id number]) [id number] Page 3 of 4 '[]'$  - 0 Ankle / Brachial Index (ABI) - do not check if billed separately '[]'$  - 0 Vital Signs Has the patient been seen at the hospital within the last three years: Yes Total Score: 25 Level Of Care: New/Established - Level 1 Electronic Signature(s) Signed: 10/30/2022 3:55:45 PM By: Valeria Batman EMT Entered By: Valeria Batman on 10/30/2022 15:52:53 -------------------------------------------------------------------------------- Encounter Discharge Information Details Patient Name: Date of Service: [facility] [name], [name]  10/30/2022 12:00 PM Medical Record Number: [id number] Patient Account Number: [id number] Date of Birth/Sex: Treating RN: 30-Apr-1951 (72 y.o. [name] Primary Care Beya Tipps: [name] Other Clinician: Valeria Batman Referring Zamora Colton: Treating Honesti Seaberg/Extender: [name] Weeks in Treatment: 2 Encounter Discharge Information Items Discharge Condition: Stable Ambulatory Status: Cane Discharge Destination: Home Transportation: Private Auto Accompanied By: none Schedule Follow-up Appointment: Yes Clinical Summary of Care: Electronic Signature(s) Signed: 10/30/2022 3:54:44 PM By: Valeria Batman EMT Previous Signature: 10/30/2022 3:41:41 PM Version By: Valeria Batman EMT Entered By: Valeria Batman on 10/30/2022 15:54:44 -------------------------------------------------------------------------------- Wound Assessment Details Patient Name: Date of Service: [facility] [name], [name] 10/30/2022 12:00 PM Medical Record Number: [id number] Patient Account Number: [id number] Date of Birth/Sex: Treating RN: 07-18-1951 (72 y.o. [name] Primary Care Coltyn Hanning: [name] Other Clinician: Valeria Batman Referring Christos Mixson: Treating Mareena Cavan/Extender: [name] Weeks in Treatment: 2 Wound Status Wound Number: 1 Primary Diabetic Wound/Ulcer of the Lower Extremity Etiology: Wound Location: Right, Medial T Great oe Secondary Acute Osteomyelitis Wounding Event: Pressure Injury Etiology: Date Acquired: 08/16/2022 Wound Open Weeks Of Treatment: 2 Status: Clustered Wound: No Comorbid Cataracts, Hypertension, Type II Diabetes, Osteomyelitis, History: Neuropathy, Confinement Anxiety Wound Measurements Length: (cm) 1 Width: (cm) 1 Depth: (cm) 0 Area: (cm) Volume: (cm) Bowersox, Clemmie ([id number]) .7 % Reduction in Area: -434.2% .1 % Reduction in Volume: -567.3% .5 Epithelialization: None 1.469 Tunneling: No 0.734 Undermining:  No [id number] Page 4 of 4 Wound Description Classification: Grade 3 Wound Margin: Distinct, outline attached Exudate Amount: Medium Exudate Type: Serosanguineous Exudate Color: red, brown Foul Odor After Cleansing: No Slough/Fibrino Yes Wound Bed Granulation Amount: Small (1-33%) Exposed Structure Granulation Quality: Red Fascia Exposed: No Necrotic Amount: Large (67-100%) Fat Layer (Subcutaneous Tissue) Exposed: Yes Necrotic Quality: Adherent Slough Tendon Exposed: Yes Muscle Exposed: No Joint Exposed: No Bone Exposed: No Periwound Skin Texture Texture Color No Abnormalities Noted:  Yes No Abnormalities Noted: Yes Moisture Temperature / Pain No Abnormalities Noted: No Temperature: No Abnormality Maceration: Yes Electronic Signature(s) Signed: 11/03/2022 5:32:37 PM By: Dellie Catholic RN Entered By: Dellie Catholic on 11/03/2022 12:58:46 -------------------------------------------------------------------------------- Vitals Details Patient Name: Date of Service: [facility] [name], [name] 10/30/2022 12:00 PM Medical Record Number: [id number] Patient Account Number: [id number] Date of Birth/Sex: Treating RN: 1951-06-11 (72 y.o. [name], [name] Primary Care Jojo Geving: [name] Other Clinician: Valeria Batman Referring Clara Herbison: Treating Dalilah Curlin/Extender: [name] Weeks in Treatment: 2 Vital Signs Time Taken: 12:27 Capillary Blood Glucose (mg/dl): 131 Height (in): 72 Reference Range: 80 - 120 mg / dl Weight (lbs): 185 Body Mass Index (BMI): 25.1 Notes At 1227 the patient blood sugar was 131. He was given 8 oz of Glucerna to drink. At 1258 the patient blood sugar was 99. He was given another 8 oz of Glucerna to drink. Dr. Celine Ahr called to see the patient. The patient stated that he took 30 units of Novolog 70/30. At 1320 rechecked blood sugar 88. Dr. Celine Ahr informed. Dr. Celine Ahr not to start treatment. Electronic  Signature(s) Signed: 10/30/2022 3:55:18 PM By: Valeria Batman EMT Previous Signature: 10/30/2022 3:40:21 PM Version By: Valeria Batman EMT Previous Signature: 10/30/2022 3:33:38 PM Version By: Valeria Batman EMT Entered By: Valeria Batman on 10/30/2022 15:55:18

## 2022-10-31 ENCOUNTER — Encounter (HOSPITAL_BASED_OUTPATIENT_CLINIC_OR_DEPARTMENT_OTHER): Payer: No Typology Code available for payment source | Admitting: General Surgery

## 2022-10-31 DIAGNOSIS — E11621 Type 2 diabetes mellitus with foot ulcer: Secondary | ICD-10-CM | POA: Diagnosis not present

## 2022-10-31 DIAGNOSIS — M86671 Other chronic osteomyelitis, right ankle and foot: Secondary | ICD-10-CM | POA: Diagnosis not present

## 2022-10-31 LAB — GLUCOSE, CAPILLARY
Glucose-Capillary: 180 mg/dL — ABNORMAL HIGH (ref 70–99)
Glucose-Capillary: 207 mg/dL — ABNORMAL HIGH (ref 70–99)

## 2022-10-31 NOTE — Progress Notes (Signed)
[place], Nick[place] ([id number]) [id number] Page 1 of 2 Visit Report for 10/31/2022 HBO Details Patient Name: Date of Service: [name], [name] 10/31/2022 12:00 PM Medical Record Number: [id number] Patient Account Number: [id number] Date of Birth/Sex: Treating RN: Sep 24, 1951 (72 y.o. 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 HBO Treatment Details Treatment Number: 1 Patient Type: Outpatient Chamber Type: Monoplace Chamber Serial #: [id number] Treatment Protocol: 2.0 ATA with 90 minutes oxygen, and no air breaks Treatment Details Compression Rate Down: 1.0 psi / minute De-Compression Rate Up: 1.0 psi / minute Air breaks and breathing Decompress Decompress Compress Tx Pressure Begins Reached periods Begins Ends (leave unused spaces blank) Chamber Pressure (ATA 1 2 ------2 1 ) Clock Time (24 hr) 12:12 12:24 - - - - - - 13:54 14:12 Treatment Length: 120 (minutes) Treatment Segments: 4 Vital Signs Capillary Blood Glucose Reference Range: 80 - 120 mg / dl HBO Diabetic Blood Glucose Intervention Range: <131 mg/dl or >249 mg/dl Time Vitals Blood Respiratory Capillary Blood Glucose Pulse Action Type: Pulse: Temperature: Taken: Pressure: Rate: Glucose (mg/dl): Meter #: Oximetry (%) Taken: Pre 11:34 180 Post 14:17 151/90 89 18 98.1 207 Treatment Response Treatment Toleration: Well Treatment Completion Status: Treatment Completed without Adverse Event Treatment Notes The patient used Afrin before treatment. Today was the patient first treatment. No problems noted. Physician HBO  Attestation: I certify that I supervised this HBO treatment in accordance with Medicare guidelines. A trained emergency response team is readily available per Yes hospital policies and procedures. Continue HBOT as ordered. Yes Electronic Signature(s) Signed: 11/01/2022 9:29:02 AM By: Fredirick Maudlin MD FACS Previous Signature: 10/31/2022 5:28:08 PM Version By: Valeria Batman EMT Entered By: Fredirick Maudlin on 11/01/2022 09:29:02 Marschner, [name] ([id number]) [id number] Page 2 of 2 -------------------------------------------------------------------------------- HBO Safety Checklist Details Patient Name: Date of ServiceJodene [name], [name] 10/31/2022 12:00 PM Medical Record Number: [id number] Patient Account Number: [id number] Date of Birth/Sex: Treating RN: 1950-11-25 (72 y.o. [name] Primary Care Alisha Bacus: [name] Other Clinician: Valeria Batman Referring Katelyn Broadnax: Treating Teondre Jarosz/Extender: [name] Weeks in Treatment: 2 HBO Safety Checklist Items Safety Checklist Consent Form Signed Patient voided / foley secured and emptied When did you last eato 1100 Last dose of injectable or oral agent Last night Ostomy pouch emptied and vented if applicable NA All implantable devices assessed, documented and approved Libre2 Dialysis shunt Intravenous access site secured and place NA Valuables secured Linens and cotton and cotton/polyester blend (less than 51% polyester) Personal oil-based products / skin lotions / body lotions removed Wigs or hairpieces removed NA Smoking or tobacco materials removed Books / newspapers / magazines / loose paper removed Cologne, aftershave, perfume and deodorant removed Jewelry removed (may wrap wedding band) Make-up removed NA Hair care products removed Battery operated devices (external) removed Heating patches and chemical warmers removed Titanium eyewear removed Nail polish cured greater  than 10 hours NA Casting material cured greater than 10 hours NA Hearing aids removed NA Loose dentures or partials removed removed by patient Prosthetics have been removed NA Patient demonstrates correct use of air break device (if applicable) Patient concerns have been addressed Patient grounding bracelet on and cord attached to chamber Specifics for Inpatients (complete in addition to above)  Medication sheet sent with patient NA Intravenous medications needed or due during therapy sent with patient NA Drainage tubes (e.g. nasogastric tube or chest tube secured and vented) NA Endotracheal or Tracheotomy tube secured NA Cuff deflated of air and inflated with saline NA Airway suctioned NA Notes The safety checklist was done before the treatment was started. Electronic Signature(s) Signed: 10/31/2022 5:25:15 PM By: Valeria Batman EMT Entered By: Valeria Batman on 10/31/2022 17:25:14

## 2022-10-31 NOTE — Progress Notes (Signed)
[name], [name] ([id number]) [id number] Page 1 of 2 Visit Report for 10/31/2022 Arrival Information Details Patient Name: Date of Service: [name], [name] 10/31/2022 12:00 PM Medical Record Number: [id number] Patient Account Number: [id number] Date of Birth/Sex: Treating RN: 09-21-1951 (72 y.o. [name], [name] Primary Care Yuniel Blaney: [name] Other Clinician: Valeria Batman Referring Duron Meister: Treating Shimon Trowbridge/Extender: [name] Weeks in Treatment: 2 Visit Information History Since Last Visit All ordered tests and consults were completed: Yes Patient Arrived: [name] Added or deleted any medications: No Arrival Time: 11:29 Any new allergies or adverse reactions: No Accompanied By: None Had a fall or experienced change in No Transfer Assistance: None activities of daily living that may affect Patient Identification Verified: Yes risk of falls: Secondary Verification Process Completed: Yes Signs or symptoms of abuse/neglect since last visito No Patient Requires Transmission-Based Precautions: No Hospitalized since last visit: No Patient Has Alerts: No Implantable device outside of the clinic excluding No cellular tissue based products placed in the center since last visit: Pain Present Now: No Electronic Signature(s) Signed: 10/31/2022 5:22:20 PM By: Valeria Batman EMT Entered By: Valeria Batman on 10/31/2022 17:22:20 -------------------------------------------------------------------------------- Encounter Discharge Information Details Patient Name: Date of Service: [facility] [name], [name] 10/31/2022 12:00 PM Medical Record Number: [id number] Patient Account Number: [id number] Date of Birth/Sex: Treating RN: May 02, 1951 (72 y.o. [name] Primary Care Lety Cullens: [name] Other Clinician: Valeria Batman Referring Davaun Quintela: Treating Janecia Palau/Extender: [name] Weeks in Treatment: 2 Encounter Discharge  Information Items Discharge Condition: Stable Ambulatory Status: Cane Discharge Destination: Home Transportation: Private Auto Accompanied By: None Schedule Follow-up Appointment: Yes Clinical Summary of Care: Electronic Signature(s) Signed: 10/31/2022 5:31:37 PM By: Valeria Batman EMT Entered By: Valeria Batman on 10/31/2022 17:31:37 Smolinski, Corney ([id number]) [id number] Page 2 of 2 -------------------------------------------------------------------------------- Vitals Details Patient Name: Date of ServiceJodene [name], [name] 10/31/2022 12:00 PM Medical Record Number: [id number] Patient Account Number: [id number] Date of Birth/Sex: Treating RN: 1950/12/15 (72 y.o. [name] Primary Care Hager Compston: [name] Other Clinician: Valeria Batman Referring Betsabe Iglesia: Treating Leinani Lisbon/Extender: [name] Weeks in Treatment: 2 Vital Signs Time Taken: 11:34 Capillary Blood Glucose (mg/dl): 180 Height (in): 72 Reference Range: 80 - 120 mg / dl Weight (lbs): 185 Body Mass Index (BMI): 25.1 Electronic Signature(s) Signed: 10/31/2022 5:22:35 PM By: Valeria Batman EMT Entered By: Valeria Batman on 10/31/2022 17:22:35

## 2022-11-01 ENCOUNTER — Encounter (HOSPITAL_BASED_OUTPATIENT_CLINIC_OR_DEPARTMENT_OTHER): Payer: No Typology Code available for payment source | Admitting: General Surgery

## 2022-11-01 DIAGNOSIS — M86671 Other chronic osteomyelitis, right ankle and foot: Secondary | ICD-10-CM | POA: Diagnosis not present

## 2022-11-01 DIAGNOSIS — E11621 Type 2 diabetes mellitus with foot ulcer: Secondary | ICD-10-CM | POA: Diagnosis not present

## 2022-11-01 LAB — GLUCOSE, CAPILLARY
Glucose-Capillary: 114 mg/dL — ABNORMAL HIGH (ref 70–99)
Glucose-Capillary: 156 mg/dL — ABNORMAL HIGH (ref 70–99)
Glucose-Capillary: 152 mg/dL — ABNORMAL HIGH (ref 70–99)

## 2022-11-01 NOTE — Progress Notes (Addendum)
[name], [name] ([id number]) [id number] Page 1 of 2 Visit Report for 11/01/2022 Arrival Information Details Patient Name: Date of Service: [name], [name] 11/01/2022 12:00 PM Medical Record Number: [id number] Patient Account Number: [id number] Date of Birth/Sex: Treating RN: 08/25/1951 (72 y.o. [name] Primary Care Blossie Raffel: [name] Other Clinician: Valeria Batman Referring Rook Maue: Treating Jkayla Spiewak/Extender: [name] Weeks in Treatment: 2 Visit Information History Since Last Visit All ordered tests and consults were completed: Yes Patient Arrived: [name] Added or deleted any medications: No Arrival Time: 11:50 Any new allergies or adverse reactions: No Accompanied By: self Had a fall or experienced change in No Transfer Assistance: None activities of daily living that may affect Patient Identification Verified: Yes risk of falls: Secondary Verification Process Completed: Yes Signs or symptoms of abuse/neglect since last visito No Patient Requires Transmission-Based Precautions: No Hospitalized since last visit: No Patient Has Alerts: No Implantable device outside of the clinic excluding No cellular tissue based products placed in the center since last visit: Pain Present Now: No Electronic Signature(s) Signed: 11/01/2022 2:20:58 PM By: Donavan Burnet CHT EMT BS , , Entered By: Donavan Burnet on 11/01/2022 14:20:58 -------------------------------------------------------------------------------- Encounter Discharge Information Details Patient Name: Date of Service: [name], [name] 11/01/2022 12:00 PM Medical Record Number: [id number] Patient Account Number: [id number] Date of Birth/Sex: Treating RN: 01/19/1951 (72 y.o. [name] Primary Care Shaylynne Lunt: [name] Other Clinician: Donavan Burnet Referring Tarshia Kot: Treating Ronesha Heenan/Extender: [name] Weeks in Treatment:  2 Encounter Discharge Information Items Discharge Condition: Stable Ambulatory Status: Cane Discharge Destination: Home Transportation: Private Auto Accompanied By: self Schedule Follow-up Appointment: No Clinical Summary of Care: Electronic Signature(s) Signed: 11/01/2022 3:46:38 PM By: Donavan Burnet CHT EMT BS , , Entered By: Donavan Burnet on 11/01/2022 15:46:37 [name], [name] ([id number] Page 2 of 2 -------------------------------------------------------------------------------- Patient/Caregiver Education Details Patient Name: Date of Service: [name], [name] 1/31/2024andnbsp12:00 PM Medical Record Number: [id number] Patient Account Number: [id number] Date of Birth/Gender: Treating RN: 1950/12/22 (72 y.o. [name] Primary Care Physician: [name] Other Clinician: Donavan Burnet Referring Physician: Treating Physician/Extender: Levin Erp in Treatment: 2 Education Assessment Education Provided To: Patient Education Topics Provided Hyperbaric Oxygenation: Methods: Demonstration, Explain/Verbal Responses: Reinforcements needed, Return demonstration correctly Electronic Signature(s) Signed: 11/12/2022 7:14:52 PM By: Donavan Burnet CHT EMT BS , , Entered By: Donavan Burnet on 11/01/2022 15:46:21 -------------------------------------------------------------------------------- Vitals Details Patient Name: Date of Service: [name], [name] 11/01/2022 12:00 PM Medical Record Number: [id number] Patient Account Number: [id number] Date of Birth/Sex: Treating RN: 1951-05-03 (72 y.o. [name] Primary Care Sayvion Vigen: [name] Other Clinician: Valeria Batman Referring Destini Cambre: Treating Halo Shevlin/Extender: [name] Weeks in Treatment: 2 Vital Signs Time Taken: 12:31 Temperature (F): 97.9 Height (in): 72 Pulse (bpm): 91 Weight (lbs): 185 Respiratory  Rate (breaths/min): 18 Body Mass Index (BMI): 25.1 Blood Pressure (mmHg): 153/61 Capillary Blood Glucose (mg/dl): 156 Reference Range: 80 - 120 mg / dl Electronic Signature(s) Signed: 11/01/2022 2:21:36 PM By: Donavan Burnet CHT EMT BS , , Entered By: Donavan Burnet on 11/01/2022 14:21:36

## 2022-11-01 NOTE — Progress Notes (Signed)
[place], Roark ([id number]) [id number] Page 1 of 2 Visit Report for 10/31/2022 Problem List Details Patient Name: Date of Service: [name], [name] 10/31/2022 12:00 PM Medical Record Number: [id number] Patient Account Number: [id number] Date of Birth/Sex: Treating RN: 11-Jun-1951 (72 y.o. 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/2022 No Yes layer exposed E11.621 Type 2 diabetes mellitus with foot ulcer 10/12/2022 No Yes N18.5 Chronic kidney disease, stage 5 10/12/2022 No Yes M86.9 Osteomyelitis, unspecified 10/12/2022 No Yes E11.42 Type 2 diabetes mellitus with diabetic polyneuropathy 10/12/2022 No Yes I10 Essential (primary) hypertension 10/12/2022 No Yes Inactive Problems Resolved Problems Electronic Signature(s) Signed: 10/31/2022 5:31:06 PM By: Valeria Batman EMT Signed: 11/01/2022 9:31:25 AM By: Fredirick Maudlin MD FACS Entered By: Valeria Batman on 10/31/2022 17:31:06 -------------------------------------------------------------------------------- SuperBill Details Patient Name: Date of Service: [name], [name] 10/31/2022 Medical Record Number: [id number] Patient Account Number: [id number] Date of Birth/Sex: Treating RN: 1950-12-07 (72 y.o. [name] Primary Care Provider: [name] Other Clinician: Valeria Batman Referring Provider: Treating Provider/Extender: [name] Weeks in Treatment: 2 [name], [name] [id number] Page 2 of 2 Diagnosis Coding ICD-10 Codes Code Description [phone] Non-pressure chronic ulcer of other part of right foot with fat layer exposed E11.621 Type 2 diabetes  mellitus with foot ulcer N18.5 Chronic kidney disease, stage 5 M86.9 Osteomyelitis, unspecified E11.42 Type 2 diabetes mellitus with diabetic polyneuropathy I10 Essential (primary) hypertension Facility Procedures : CPT4 Code Description: [id number] G0277-(Facility Use Only) HBOT full body chamber, 84mn , ICD-10 Diagnosis Description M86.9 Osteomyelitis, unspecified L97.512 Non-pressure chronic ulcer of other part of right foot wit E11.621 Type 2 diabetes mellitus  with foot ulcer E11.42 Type 2 diabetes mellitus with diabetic polyneuropathy Modifier: h fat layer Quantity: 4 exposed Physician Procedures : CPT4 Code Description Modifier [id number] 12878- WC PHYS HYPERBARIC OXYGEN THERAPY ICD-10 Diagnosis Description M86.9 Osteomyelitis, unspecified L97.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

## 2022-11-01 NOTE — Progress Notes (Addendum)
[place], Ovidio ([id number]) [id number] Page 1 of 2 Visit Report for 11/01/2022 HBO Details Patient Name: Date of Service: [name], [name] 11/01/2022 12:00 PM Medical Record Number: [id number] Patient Account Number: [id number] Date of Birth/Sex: Treating RN: [birth year] (72 y.o. 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 HBO Treatment Details Treatment Number: 2 Patient Type: Outpatient Chamber Type: Monoplace Chamber Serial #: [id number] Treatment Protocol: 2.0 ATA with 90 minutes oxygen, and no air breaks Treatment Details Compression Rate Down: 1.0 psi / minute De-Compression Rate Up: 1.0 psi / minute Air breaks and breathing Decompress Decompress Compress Tx Pressure Begins Reached periods Begins Ends (leave unused spaces blank) Chamber Pressure (ATA 1 2 ------2 1 ) Clock Time (24 hr) 12:44 12:59 - - - - - - 14:29 14:51 Treatment Length: 127 (minutes) Treatment Segments: 4 Vital Signs Capillary Blood Glucose Reference Range: 80 - 120 mg / dl HBO Diabetic Blood Glucose Intervention Range: <131 mg/dl or >249 mg/dl Type: Time Vitals Blood Respiratory Capillary Blood Glucose Pulse Action Pulse: Temperature: Taken: Pressure: Rate: Glucose (mg/dl): Meter #: Oximetry (%) Taken: Pre 12:31 153/61 91 18 97.9 156 1 cleared for HBOT Pre 11:54 114 1 Given 8 oz Glucerna Post 14:54 148/68 80 18 97.8 152 1 none per protocol. Treatment Response Treatment Toleration: Well Treatment Completion Status: Treatment Completed without Adverse Event Treatment Notes Patient arrived, blood  glucose measured at 114 mg/dL (1154). Patient states that he ate oatmeal and toast for breakfast. Patient given 8 oz Glucerna and prepared for treatment. At 1231 blood glucose level measured again at 156 mg/dL (1231). Other vital signs were taken. After performing safety check patient was placed in the chamber and chamber was compressed at 1 psi/min (high purge flow rate). [name] tolerated the treatment well and decompression of the chamber at 2 psi/min. Post treatment glucose level was 152 mg/dL. Patient denies any issues with ear equalization and/or pain. Patient was stable upon discharge. Physician HBO Attestation: I certify that I supervised this HBO treatment in accordance with Medicare guidelines. A trained emergency response team is readily available per Yes hospital policies and procedures. Continue HBOT as ordered. Yes Electronic Signature(s) Signed: 11/01/2022 3:53:28 PM By: Fredirick Maudlin MD FACS Previous Signature: 11/01/2022 3:44:28 PM Version By: Donavan Burnet CHT EMT BS , , [place], [place] ([id number]) [id number] Page 2 of 2 Previous Signature: 11/01/2022 3:44:28 PM Version By: Donavan Burnet CHT EMT BS , , Previous Signature: 11/01/2022 3:43:35 PM Version By: Donavan Burnet CHT EMT BS , , Previous Signature: 11/01/2022 2:24:28 PM Version By: Donavan Burnet CHT EMT BS , , Entered By: Fredirick Maudlin on 11/01/2022 15:53:28 -------------------------------------------------------------------------------- HBO Safety Checklist Details Patient Name: Date of Service: [facility] [name], [name] 11/01/2022 12:00 PM Medical Record Number: [id number] Patient Account Number: [id number] Date of Birth/Sex: Treating RN: 1951-05-19 (72 y.o. [name] Primary Care Wolf Boulay: [name] Other Clinician: Valeria Batman Referring Orlo Brickle: Treating Bonne Whack/Extender: [name] Weeks in Treatment: 2 HBO Safety Checklist Items Safety  Checklist Consent Form Signed Patient voided / foley secured and emptied When did you last eato 1030 Last dose of injectable or oral agent 1015 Ostomy pouch emptied and vented if applicable NA All implantable devices assessed, documented and  approved Libre 2 Intravenous access site secured and place NA Valuables secured Linens and cotton and cotton/polyester blend (less than 51% polyester) Personal oil-based products / skin lotions / body lotions removed Wigs or hairpieces removed NA Smoking or tobacco materials removed NA Books / newspapers / magazines / loose paper removed Cologne, aftershave, perfume and deodorant removed Jewelry removed (may wrap wedding band) Make-up removed NA Hair care products removed NA Battery operated devices (external) removed Heating patches and chemical warmers removed Titanium eyewear removed NA Nail polish cured greater than 10 hours NA Casting material cured greater than 10 hours NA Hearing aids removed NA Loose dentures or partials removed dentures removed Prosthetics have been removed NA Patient demonstrates correct use of air break device (if applicable) Patient concerns have been addressed Patient grounding bracelet on and cord attached to chamber Specifics for Inpatients (complete in addition to above) Medication sheet sent with patient NA Intravenous medications needed or due during therapy sent with patient NA Drainage tubes (e.g. nasogastric tube or chest tube secured and vented) NA Endotracheal or Tracheotomy tube secured NA Cuff deflated of air and inflated with saline NA Airway suctioned NA Notes Paper version used prior to treatment. Electronic Signature(s) Signed: 11/01/2022 2:23:33 PM By: Donavan Burnet CHT EMT BS , , Entered By: Donavan Burnet on 11/01/2022 14:23:33

## 2022-11-01 NOTE — Progress Notes (Signed)
PATRYCK, KILGORE ([id number]) [id number] Page 1 of 1 Visit Report for 11/01/2022 SuperBill Details Patient Name: Date of Service: [name], [name] 11/01/2022 Medical Record Number: [id number] Patient Account Number: [id number] Date of Birth/Sex: Treating RN: 13-Aug-1951 (72 y.o. [name] Primary Care Provider: [name] Other Clinician: Donavan Burnet Referring Provider: Treating Provider/Extender: [name] Weeks in Treatment: 2 Diagnosis Coding ICD-10 Codes Code Description [phone] Non-pressure chronic ulcer of other part of right foot with fat layer exposed E11.621 Type 2 diabetes mellitus with foot ulcer N18.5 Chronic kidney disease, stage 5 M86.9 Osteomyelitis, unspecified E11.42 Type 2 diabetes mellitus with diabetic polyneuropathy I10 Essential (primary) hypertension Facility Procedures CPT4 Code Description Modifier Quantity [id number] G0277-(Facility Use Only) HBOT full body chamber, 62mn , 4 ICD-10 Diagnosis Description M86.9 Osteomyelitis, unspecified 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 E11.621 Type 2 diabetes mellitus with foot ulcer Physician Procedures Quantity CPT4 Code Description Modifier [id number] 37048- WC PHYS HYPERBARIC OXYGEN THERAPY 1 ICD-10 Diagnosis Description M86.9 Osteomyelitis, unspecified 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

## 2022-11-02 ENCOUNTER — Encounter (HOSPITAL_BASED_OUTPATIENT_CLINIC_OR_DEPARTMENT_OTHER): Payer: No Typology Code available for payment source | Attending: General Surgery | Admitting: General Surgery

## 2022-11-02 DIAGNOSIS — E1169 Type 2 diabetes mellitus with other specified complication: Secondary | ICD-10-CM | POA: Diagnosis not present

## 2022-11-02 DIAGNOSIS — E1142 Type 2 diabetes mellitus with diabetic polyneuropathy: Secondary | ICD-10-CM | POA: Insufficient documentation

## 2022-11-02 DIAGNOSIS — M869 Osteomyelitis, unspecified: Secondary | ICD-10-CM | POA: Diagnosis not present

## 2022-11-02 DIAGNOSIS — L608 Other nail disorders: Secondary | ICD-10-CM | POA: Diagnosis not present

## 2022-11-02 DIAGNOSIS — N185 Chronic kidney disease, stage 5: Secondary | ICD-10-CM | POA: Diagnosis not present

## 2022-11-02 DIAGNOSIS — M86671 Other chronic osteomyelitis, right ankle and foot: Secondary | ICD-10-CM | POA: Diagnosis not present

## 2022-11-02 DIAGNOSIS — E1122 Type 2 diabetes mellitus with diabetic chronic kidney disease: Secondary | ICD-10-CM | POA: Insufficient documentation

## 2022-11-02 DIAGNOSIS — I120 Hypertensive chronic kidney disease with stage 5 chronic kidney disease or end stage renal disease: Secondary | ICD-10-CM | POA: Insufficient documentation

## 2022-11-02 DIAGNOSIS — L97512 Non-pressure chronic ulcer of other part of right foot with fat layer exposed: Secondary | ICD-10-CM | POA: Insufficient documentation

## 2022-11-02 DIAGNOSIS — Z87891 Personal history of nicotine dependence: Secondary | ICD-10-CM | POA: Insufficient documentation

## 2022-11-02 DIAGNOSIS — E11621 Type 2 diabetes mellitus with foot ulcer: Secondary | ICD-10-CM | POA: Insufficient documentation

## 2022-11-02 LAB — GLUCOSE, CAPILLARY
Glucose-Capillary: 223 mg/dL — ABNORMAL HIGH (ref 70–99)
Glucose-Capillary: 134 mg/dL — ABNORMAL HIGH (ref 70–99)

## 2022-11-02 NOTE — Progress Notes (Signed)
[place], [place] ([id number]) [id number] Page 1 of 2 Visit Report for 11/02/2022 Problem List Details Patient Name: Date of Service: [name], [name] 11/02/2022 12:00 PM Medical Record Number: [id number] Patient Account Number: [id number] Date of Birth/Sex: Treating RN: 07-May-1951 (72 y.o. 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/12/2022 No Yes layer exposed E11.621 Type 2 diabetes mellitus with foot ulcer 10/12/2022 No Yes N18.5 Chronic kidney disease, stage 5 10/12/2022 No Yes M86.9 Osteomyelitis, unspecified 10/12/2022 No Yes E11.42 Type 2 diabetes mellitus with diabetic polyneuropathy 10/12/2022 No Yes I10 Essential (primary) hypertension 10/12/2022 No Yes Inactive Problems Resolved Problems Electronic Signature(s) Signed: 11/02/2022 2:45:06 PM By: Valeria Batman EMT Signed: 11/02/2022 2:49:48 PM By: Fredirick Maudlin MD FACS Entered By: Valeria Batman on 11/02/2022 14:45:06 -------------------------------------------------------------------------------- SuperBill Details Patient Name: Date of Service: [facility] [name], [name] 11/02/2022 Medical Record Number: [id number] Patient Account Number: [id number] Date of Birth/Sex: Treating RN: Nov 16, 1950 (72 y.o. [name] Primary Care Provider: [name] Other Clinician: Valeria Batman Referring Provider: Treating Provider/Extender: [name] Weeks in Treatment: 3 [place], [name] ([id number]) [id number] Page 2 of 2 Diagnosis Coding ICD-10 Codes Code Description [phone] Non-pressure chronic ulcer of other part of right foot with fat layer exposed E11.621 Type 2 diabetes  mellitus with foot ulcer N18.5 Chronic kidney disease, stage 5 M86.9 Osteomyelitis, unspecified E11.42 Type 2 diabetes mellitus with diabetic polyneuropathy I10 Essential (primary) hypertension Facility Procedures : CPT4 Code Description: [id number] G0277-(Facility Use Only) HBOT full body chamber, 42mn , ICD-10 Diagnosis Description M86.9 Osteomyelitis, unspecified L97.512 Non-pressure chronic ulcer of other part of right foot wit E11.621 Type 2 diabetes mellitus  with foot ulcer E11.42 Type 2 diabetes mellitus with diabetic polyneuropathy Modifier: h fat layer Quantity: 4 exposed Physician Procedures : CPT4 Code Description Modifier [id number] 46803- WC PHYS HYPERBARIC OXYGEN THERAPY ICD-10 Diagnosis Description M86.9 Osteomyelitis, unspecified L97.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

## 2022-11-02 NOTE — Progress Notes (Signed)
[place], Darrel ([id number]) [id number] Page 1 of 2 Visit Report for 11/02/2022 Arrival Information Details Patient Name: Date of Service: [name], [name] 11/02/2022 12:00 PM Medical Record Number: [id number] Patient Account Number: [id number] Date of Birth/Sex: Treating RN: Jul 16, 1951 (72 y.o. [name] Primary Care Joliyah Lippens: [name] Other Clinician: Valeria Batman Referring Leveta Wahab: Treating Leston Schueller/Extender: [name] Weeks in Treatment: 3 Visit Information History Since Last Visit All ordered tests and consults were completed: Yes Patient Arrived: Cane Added or deleted any medications: No Arrival Time: 11:30 Any new allergies or adverse reactions: No Accompanied By: None Had a fall or experienced change in No Transfer Assistance: None activities of daily living that may affect Patient Identification Verified: Yes risk of falls: Secondary Verification Process Completed: Yes Signs or symptoms of abuse/neglect since last visito No Patient Requires Transmission-Based Precautions: No Hospitalized since last visit: No Patient Has Alerts: No Implantable device outside of the clinic excluding No cellular tissue based products placed in the center since last visit: Pain Present Now: No Electronic Signature(s) Signed: 11/02/2022 1:55:10 PM By: Valeria Batman EMT Entered By: Valeria Batman on 11/02/2022 13:55:10 -------------------------------------------------------------------------------- Encounter Discharge Information Details Patient Name: Date of Service: [facility] [name], [name] 11/02/2022 12:00 PM Medical Record Number: [id number] Patient Account Number: [id number] Date of Birth/Sex: Treating RN: 1950-12-28 (72 y.o. [name] Primary Care Alaia Lordi: [name] Other Clinician: Valeria Batman Referring Shoshanna Mcquitty: Treating Lisandra Mathisen/Extender: [name] Weeks in Treatment: 3 Encounter Discharge  Information Items Discharge Condition: Stable Ambulatory Status: Cane Discharge Destination: Home Transportation: Private Auto Accompanied By: Wife Schedule Follow-up Appointment: Yes Clinical Summary of Care: Electronic Signature(s) Signed: 11/02/2022 2:45:38 PM By: Valeria Batman EMT Entered By: Valeria Batman on 11/02/2022 14:45:37 Sanz, Skylen ([id number]) [id number] Page 2 of 2 -------------------------------------------------------------------------------- Vitals Details Patient Name: Date of Service: [facility] [name], [name] 11/02/2022 12:00 PM Medical Record Number: [id number] Patient Account Number: [id number] Date of Birth/Sex: Treating RN: 1951-09-13 (72 y.o. [name] Primary Care Shaune Malacara: [name] Other Clinician: Valeria Batman Referring Andreyah Natividad: Treating Donta Fuster/Extender: [name] Weeks in Treatment: 3 Vital Signs Time Taken: 11:30 Temperature (F): 98.2 Height (in): 72 Pulse (bpm): 88 Weight (lbs): 185 Respiratory Rate (breaths/min): 18 Body Mass Index (BMI): 25.1 Blood Pressure (mmHg): 165/66 Capillary Blood Glucose (mg/dl): 223 Reference Range: 80 - 120 mg / dl Electronic Signature(s) Signed: 11/02/2022 1:55:40 PM By: Valeria Batman EMT Entered By: Valeria Batman on 11/02/2022 13:55:39

## 2022-11-02 NOTE — Progress Notes (Addendum)
[place], [place] ([id number]) [id number] Page 1 of 2 Visit Report for 11/02/2022 HBO Details Patient Name: Date of Service: [facility] [name], [name] 11/02/2022 12:00 PM Medical Record Number: [id number] Patient Account Number: [id number] Date of Birth/Sex: Treating RN: 12/27/1950 (72 y.o. 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 HBO Treatment Details Treatment Number: 3 Patient Type: Outpatient Chamber Type: Monoplace Chamber Serial #: [id number] Treatment Protocol: 2.0 ATA with 90 minutes oxygen, and no air breaks Treatment Details Compression Rate Down: 1.5 psi / minute De-Compression Rate Up: Air breaks and breathing Decompress Decompress Compress Tx Pressure Begins Reached periods Begins Ends (leave unused spaces blank) Chamber Pressure (ATA 1 2 ------2 1 ) Clock Time (24 hr) 12:24 12:36 - - - - - - 14:06 14:15 Treatment Length: 111 (minutes) Treatment Segments: 4 Vital Signs Capillary Blood Glucose Reference Range: 80 - 120 mg / dl HBO Diabetic Blood Glucose Intervention Range: <131 mg/dl or >249 mg/dl Time Vitals Blood Respiratory Capillary Blood Glucose Pulse Action Type: Pulse: Temperature: Taken: Pressure: Rate: Glucose (mg/dl): Meter #: Oximetry (%) Taken: Pre 11:30 165/66 88 18 98.2 223 Post 14:21 133/70 86 18 98.1 134 Treatment Response Treatment Toleration: Well Treatment Completion Status: Treatment Completed without Adverse Event Treatment Notes The patient stated that he had a Sloppy Joe on wheat with coffee before coming in for treatment. Reino Kent Physician HBO  Attestation: I certify that I supervised this HBO treatment in accordance with Medicare guidelines. A trained emergency response team is readily available per Yes hospital policies and procedures. Continue HBOT as ordered. Yes Electronic Signature(s) Signed: 11/02/2022 4:06:40 PM By: Fredirick Maudlin MD FACS Previous Signature: 11/02/2022 2:32:58 PM Version By: Valeria Batman EMT Previous Signature: 11/02/2022 1:58:20 PM Version By: Valeria Batman EMT Entered By: Fredirick Maudlin on 11/02/2022 16:06:40 Bruns, Mallie ([id number]) [id number] Page 2 of 2 -------------------------------------------------------------------------------- HBO Safety Checklist Details Patient Name: Date of ServiceJodene Nam, [name] 11/02/2022 12:00 PM Medical Record Number: [id number] Patient Account Number: [id number] Date of Birth/Sex: Treating RN: 1951-07-01 (72 y.o. [name] Primary Care Diane Hanel: [name] Other Clinician: Valeria Batman Referring Nanea Jared: Treating Merrissa Giacobbe/Extender: [name] Weeks in Treatment: 3 HBO Safety Checklist Items Safety Checklist Consent Form Signed Patient voided / foley secured and emptied When did you last eato 1015 Last dose of injectable or oral agent 1015 Ostomy pouch emptied and vented if applicable NA All implantable devices assessed, documented and approved Libre2 Dialysis shunt Intravenous access site secured and place NA Valuables secured Linens and cotton and cotton/polyester blend (less than 51% polyester) Personal oil-based products / skin lotions / body lotions removed Wigs or hairpieces removed NA Smoking or tobacco materials removed Books / newspapers / magazines / loose paper removed Cologne, aftershave, perfume and deodorant removed Jewelry removed (may wrap wedding band) Make-up removed NA Hair care products removed Battery operated devices (external) removed Heating patches and chemical warmers  removed Titanium eyewear removed Nail polish cured greater than 10 hours NA Casting material cured greater than 10 hours NA Hearing aids removed NA Loose dentures or partials removed Prosthetics have been removed NA Patient demonstrates correct use of air break device (if applicable) Patient concerns have been addressed Patient grounding bracelet on and cord  attached to chamber Specifics for Inpatients (complete in addition to above) Medication sheet sent with patient NA Intravenous medications needed or due during therapy sent with patient NA Drainage tubes (e.g. nasogastric tube or chest tube secured and vented) NA Endotracheal or Tracheotomy tube secured NA Cuff deflated of air and inflated with saline NA Airway suctioned NA Electronic Signature(s) Signed: 11/02/2022 1:56:53 PM By: Valeria Batman EMT Entered By: Valeria Batman on 11/02/2022 13:56:53

## 2022-11-03 ENCOUNTER — Encounter (HOSPITAL_BASED_OUTPATIENT_CLINIC_OR_DEPARTMENT_OTHER): Payer: No Typology Code available for payment source | Admitting: General Surgery

## 2022-11-03 DIAGNOSIS — E11621 Type 2 diabetes mellitus with foot ulcer: Secondary | ICD-10-CM | POA: Diagnosis not present

## 2022-11-03 DIAGNOSIS — L97512 Non-pressure chronic ulcer of other part of right foot with fat layer exposed: Secondary | ICD-10-CM | POA: Diagnosis not present

## 2022-11-03 DIAGNOSIS — M86671 Other chronic osteomyelitis, right ankle and foot: Secondary | ICD-10-CM | POA: Diagnosis not present

## 2022-11-03 LAB — GLUCOSE, CAPILLARY: Glucose-Capillary: 219 mg/dL — ABNORMAL HIGH (ref 70–99)

## 2022-11-03 NOTE — Progress Notes (Signed)
JESTON, JUNKINS ([id number]) [id number] Page 1 of 9 Visit Report for 11/03/2022 Chief Complaint Document Details Patient Name: Date of Service: [name], [name] 11/03/2022 12:45 PM Medical Record Number: [id number] Patient Account Number: [id number] Date of Birth/Sex: Treating RN: 02-12-1951 (72 y.o. M) Primary Care Provider: [name] Other Clinician: Referring Provider: Treating Provider/Extender: [name] Weeks in Treatment: 3 Information Obtained from: Patient Chief Complaint Patients presents for treatment of an open diabetic ulcer on the right great toe Electronic Signature(s) Signed: 11/03/2022 1:28:19 PM By: Fredirick Maudlin MD FACS Entered By: Fredirick Maudlin on 11/03/2022 13:28:19 -------------------------------------------------------------------------------- Debridement Details Patient Name: Date of Service: [facility], [name] 11/03/2022 12:45 PM Medical Record Number: [id number] Patient Account Number: [id number] Date of Birth/Sex: Treating RN: 1951/01/17 (72 y.o. M) Primary Care Provider: [name] Other Clinician: Referring Provider: Treating Provider/Extender: [name] Weeks in Treatment: 3 Debridement Performed for Assessment: Wound #1 Right,Medial T Great oe Performed By: Physician Fredirick Maudlin, MD Debridement Type: Debridement Severity of Tissue Pre Debridement: Fat layer exposed Level of Consciousness (Pre-procedure): Awake and Alert Pre-procedure Verification/Time Out Yes - 12:50 Taken: Start Time: 12:50 Pain Control: Lidocaine 5% topical ointment T Area Debrided (L x W): otal 1.5 (cm) x 1.1 (cm) = 1.65 (cm) Tissue and other material debrided: Non-Viable, Callus, Subcutaneous, Fibrin/Exudate Level: Skin/Subcutaneous Tissue Debridement Description: Excisional Instrument: Curette Bleeding: Minimum Hemostasis Achieved: Pressure End Time: 12:52 Procedural Pain: 0 Post Procedural Pain:  0 Response to Treatment: Procedure was tolerated well Level of Consciousness (Post- Awake and Alert procedure): Post Debridement Measurements of Total Wound Length: (cm) 1.5 Width: (cm) 1.1 Depth: (cm) 0.5 Volume: (cm) 0.648 Character of Wound/Ulcer Post Debridement: Improved Severity of Tissue Post Debridement: Fat layer exposed [name], [name] ([id number]) [id number] Page 2 of 9 Post Procedure Diagnosis Same as Pre-procedure Notes Scribed for Dr. Celine Ahr by J.Scotton Electronic Signature(s) Signed: 11/03/2022 1:30:59 PM By: Fredirick Maudlin MD FACS Entered By: Fredirick Maudlin on 11/03/2022 13:30:59 -------------------------------------------------------------------------------- HPI Details Patient Name: Date of Service: [facility] [name], [name] 11/03/2022 12:45 PM Medical Record Number: [id number] Patient Account Number: [id number] Date of Birth/Sex: Treating RN: 1951-06-04 (72 y.o. M) Primary Care Provider: [name] Other Clinician: Referring Provider: Treating Provider/Extender: [name] Weeks in Treatment: 3 History of Present Illness HPI Description: ADMISSION 10/12/2022 This is a 72 year old type II diabetic (last hemoglobin A1c 7.7 in October 2023) with stage V chronic kidney disease and hypertension. He has been followed by podiatry for an ulcer on his right great toe. He has a history of prior toe and foot infections requiring amputation. In November 2023, he had an outpatient MRI that demonstrated osteomyelitis. He was admitted to the hospital for IV antibiotics and subsequently underwent irrigation and debridement of the toe with biopsy and placement of married 3 layer matrix. Cultures grew out MRSA and osteomyelitis was seen on pathology. He was followed by infectious disease and given a course of doxycycline and Augmentin. He completed this course at the end of December. He has been evaluated by vascular surgery with formal ABIs  as well as consultation. He does have adequate blood flow at least to the dorsalis pedis. He last saw podiatry on January 4 of this year where he underwent debridement. It was felt he would benefit from specialized wound care and he was referred to our center for further evaluation and management. On the dorsal aspect of his right great toe, there is a circular ulcer. There is a fair amount of undermining from  callus and dry skin. The surface is a bit pale and fibrotic, but there is no obvious necrosis. 10/19/2022: He has been approved by his insurance to undergo hyperbaric oxygen therapy. EKG has been obtained and is normal. We are still waiting on his chest x-ray. The wound is clean with just a little bit of periwound callus and slough on the surface. Tendon is exposed. 10/26/2022: Unfortunately, the required co-pay from his insurance is cost prohibitive for him and he will not be able to receive hyperbaric oxygen therapy. Some moisture got under the dry skin around his wound and lifted it off but there is good epithelialized tissue beneath this. The exposed tendon has deteriorated. 11/03/2022: After some discussion with the financial office, it has become feasible for the patient to undergo hyperbaric oxygen therapy and he has received his first treatments this week. The wound is a little bit smaller and the tissue has a better color to it, but there is still extensive nonviable subcutaneous tissue and slough present. Electronic Signature(s) Signed: 11/03/2022 1:29:30 PM By: Fredirick Maudlin MD FACS Entered By: Fredirick Maudlin on 11/03/2022 13:29:30 -------------------------------------------------------------------------------- Physical Exam Details Patient Name: Date of Service: [facility] [name], [name] 11/03/2022 12:45 PM Medical Record Number: [id number] Patient Account Number: [id number] Date of Birth/Sex: Treating RN: [birth year] (72 y.o. M) Primary Care Provider: [name] Other Clinician: Referring  Provider: Treating Provider/Extender: [name] Weeks in Treatment: 3 Constitutional Hypertensive, asymptomatic. . . . no acute distress. NECO, KLING ([id number]) [id number] Page 3 of 9 Respiratory Normal work of breathing on room air. Notes 11/03/2022: The wound is a little bit smaller and the tissue has a better color to it, but there is still extensive nonviable subcutaneous tissue and slough present. Electronic Signature(s) Signed: 11/03/2022 1:30:13 PM By: Fredirick Maudlin MD FACS Entered By: Fredirick Maudlin on 11/03/2022 13:30:13 -------------------------------------------------------------------------------- Physician Orders Details Patient Name: Date of Service: [facility], [name] 11/03/2022 12:45 PM Medical Record Number: [id number] Patient Account Number: [id number] Date of Birth/Sex: Treating RN: Mar 03, 1951 (72 y.o. [name] Primary Care Provider: [name] Other Clinician: Referring Provider: Treating Provider/Extender: [name] Weeks in Treatment: 3 Verbal / Phone Orders: No Diagnosis Coding ICD-10 Coding Code Description [phone] Non-pressure chronic ulcer of other part of right foot with fat layer exposed E11.621 Type 2 diabetes mellitus with foot ulcer N18.5 Chronic kidney disease, stage 5 M86.9 Osteomyelitis, unspecified E11.42 Type 2 diabetes mellitus with diabetic polyneuropathy I10 Essential (primary) hypertension Follow-up Appointments ppointment in 1 week. - Dr. Celine Ahr Rm 1 Return A Friday 2/2 @ 12:45 pm Anesthetic Wound #1 Right,Medial T Great oe (In clinic) Topical Lidocaine 5% applied to wound bed - prior to debridement Bathing/ Shower/ Hygiene May shower and wash wound with soap and water. Off-Loading Wound #1 Right,Medial T Great oe Other: - Diabetic shoe Wound Treatment Wound #1 - T Great oe Wound Laterality: Right, Medial Cleanser: Soap and Water 1 x Per Day/30  Days Discharge Instructions: May shower and wash wound with dial antibacterial soap and water prior to dressing change. Cleanser: Wound Cleanser 1 x Per Day/30 Days Discharge Instructions: Cleanse the wound with wound cleanser prior to applying a clean dressing using gauze sponges, not tissue or cotton balls. Prim Dressing: Sorbalgon AG Dressing 2x2 (in/in) 1 x Per Day/30 Days ary Discharge Instructions: Apply to wound bed as instructed Secondary Dressing: Woven Gauze Sponge, Non-Sterile 4x4 in 1 x Per Day/30 Days Discharge Instructions: Apply over primary dressing as directed. Secured With: Psychologist, occupational  Gauze Bandage, Sterile 2x75 (in/in) 1 x Per Day/30 Days Discharge Instructions: Secure with stretch gauze as directed. Secured With: 60M Medipore Public affairs consultant Surgical T 2x10 (in/yd) ape 1 x Per Day/30 Days Wanzer, Hardie ([id number]) [id number] Page 4 of 9 Discharge Instructions: Secure with tape as directed. Electronic Signature(s) Signed: 11/03/2022 2:02:28 PM By: Fredirick Maudlin MD FACS Entered By: Fredirick Maudlin on 11/03/2022 13:30:23 -------------------------------------------------------------------------------- Problem List Details Patient Name: Date of Service: [facility], [name] 11/03/2022 12:45 PM Medical Record Number: [id number] Patient Account Number: [id number] Date of Birth/Sex: Treating RN: Jan 24, 1951 (72 y.o. M) Primary Care Provider: [name] Other Clinician: Referring Provider: Treating Provider/Extender: [name] Weeks in Treatment: 3 Active Problems ICD-10 Encounter Code Description Active Date MDM Diagnosis L97.512 Non-pressure chronic ulcer of other part of right foot with fat layer exposed 10/12/2022 No Yes E11.621 Type 2 diabetes mellitus with foot ulcer 10/12/2022 No Yes N18.5 Chronic kidney disease, stage 5 10/12/2022 No Yes M86.9 Osteomyelitis, unspecified 10/12/2022 No Yes E11.42 Type 2 diabetes mellitus  with diabetic polyneuropathy 10/12/2022 No Yes I10 Essential (primary) hypertension 10/12/2022 No Yes Inactive Problems Resolved Problems Electronic Signature(s) Signed: 11/03/2022 1:28:01 PM By: Fredirick Maudlin MD FACS Entered By: Fredirick Maudlin on 11/03/2022 13:28:01 -------------------------------------------------------------------------------- Progress Note Details Patient Name: Date of Service: [facility] [name], [name] 11/03/2022 12:45 PM [name] ([id number]) [id number] Page 5 of 9 Medical Record Number: [id number] Patient Account Number: [id number] Date of Birth/Sex: Treating RN: 04/09/1951 (72 y.o. M) Primary Care Provider: [name] Other Clinician: Referring Provider: Treating Provider/Extender: [name] Weeks in Treatment: 3 Subjective Chief Complaint Information obtained from Patient Patients presents for treatment of an open diabetic ulcer on the right great toe History of Present Illness (HPI) ADMISSION 10/12/2022 This is a 72 year old type II diabetic (last hemoglobin A1c 7.7 in October 2023) with stage V chronic kidney disease and hypertension. He has been followed by podiatry for an ulcer on his right great toe. He has a history of prior toe and foot infections requiring amputation. In November 2023, he had an outpatient MRI that demonstrated osteomyelitis. He was admitted to the hospital for IV antibiotics and subsequently underwent irrigation and debridement of the toe with biopsy and placement of married 3 layer matrix. Cultures grew out MRSA and osteomyelitis was seen on pathology. He was followed by infectious disease and given a course of doxycycline and Augmentin. He completed this course at the end of December. He has been evaluated by vascular surgery with formal ABIs as well as consultation. He does have adequate blood flow at least to the dorsalis pedis. He last saw podiatry on January 4 of this year where  he underwent debridement. It was felt he would benefit from specialized wound care and he was referred to our center for further evaluation and management. On the dorsal aspect of his right great toe, there is a circular ulcer. There is a fair amount of undermining from callus and dry skin. The surface is a bit pale and fibrotic, but there is no obvious necrosis. 10/19/2022: He has been approved by his insurance to undergo hyperbaric oxygen therapy. EKG has been obtained and is normal. We are still waiting on his chest x-ray. The wound is clean with just a little bit of periwound callus and slough on the surface. Tendon is exposed. 10/26/2022: Unfortunately, the required co-pay from his insurance is cost prohibitive for him and he will not be able to receive hyperbaric oxygen therapy. Some moisture got under the  dry skin around his wound and lifted it off but there is good epithelialized tissue beneath this. The exposed tendon has deteriorated. 11/03/2022: After some discussion with the financial office, it has become feasible for the patient to undergo hyperbaric oxygen therapy and he has received his first treatments this week. The wound is a little bit smaller and the tissue has a better color to it, but there is still extensive nonviable subcutaneous tissue and slough present. Patient History Information obtained from Patient. Family History Diabetes - Mother,Siblings, Heart Disease - Siblings, Hypertension - Siblings, Seizures - Father, Stroke - Siblings, No family history of Cancer, Hereditary Spherocytosis, Kidney Disease, Lung Disease, Thyroid Problems, Tuberculosis. Social History Former smoker, Marital Status - Married, Alcohol Use - Rarely, Drug Use - No History, Caffeine Use - Daily. Medical History Eyes Patient has history of Cataracts - rt eye Ear/Nose/Mouth/Throat Denies history of Chronic sinus problems/congestion Hematologic/Lymphatic Denies history of Anemia, Hemophilia, Human  Immunodeficiency Virus, Lymphedema, Sickle Cell Disease Respiratory Denies history of Aspiration, Asthma, Chronic Obstructive Pulmonary Disease (COPD), Pneumothorax, Tuberculosis Cardiovascular Patient has history of Hypertension Gastrointestinal Denies history of Cirrhosis , Colitis, Crohnoos, Hepatitis A, Hepatitis B, Hepatitis C Endocrine Patient has history of Type II Diabetes Genitourinary Denies history of End Stage Renal Disease Immunological Denies history of Lupus Erythematosus, Raynaudoos, Scleroderma Integumentary (Skin) Denies history of History of Burn Musculoskeletal Patient has history of Osteomyelitis - MRI confirmed (08/16/22) Denies history of Gout Neurologic Patient has history of Neuropathy Psychiatric Patient has history of Confinement Anxiety Denies history of Anorexia/bulimia Hospitalization/Surgery History - AV fistula place left upper arm 10/20/22. Medical A Surgical History Notes nd Genitourinary CKD IV Bisceglia, Adewale ([id number]) [id number] Page 6 of 9 Objective Constitutional Hypertensive, asymptomatic. no acute distress. Vitals Time Taken: 12:37 PM, Height: 72 in, Weight: 185 lbs, BMI: 25.1, Temperature: 97.9 F, Pulse: 90 bpm, Respiratory Rate: 16 breaths/min, Blood Pressure: 156/77 mmHg. Respiratory Normal work of breathing on room air. General Notes: 11/03/2022: The wound is a little bit smaller and the tissue has a better color to it, but there is still extensive nonviable subcutaneous tissue and slough present. Integumentary (Hair, Skin) Wound #1 status is Open. Original cause of wound was Pressure Injury. The date acquired was: 08/16/2022. The wound has been in treatment 3 weeks. The wound is located on the Right,Medial T Great. The wound measures 1.5cm length x 1.1cm width x 0.5cm depth; 1.296cm^2 area and 0.648cm^3 volume. oe There is tendon and Fat Layer (Subcutaneous Tissue) exposed. There is no tunneling or  undermining noted. There is a medium amount of serosanguineous drainage noted. The wound margin is distinct with the outline attached to the wound base. There is small (1-33%) red granulation within the wound bed. There is a large (67-100%) amount of necrotic tissue within the wound bed including Adherent Slough. The periwound skin appearance had no abnormalities noted for texture. The periwound skin appearance had no abnormalities noted for color. The periwound skin appearance exhibited: Maceration. Periwound temperature was noted as No Abnormality. Assessment Active Problems ICD-10 Non-pressure chronic ulcer of other part of right foot with fat layer exposed Type 2 diabetes mellitus with foot ulcer Chronic kidney disease, stage 5 Osteomyelitis, unspecified Type 2 diabetes mellitus with diabetic polyneuropathy Essential (primary) hypertension Procedures Wound #1 Pre-procedure diagnosis of Wound #1 is a Diabetic Wound/Ulcer of the Lower Extremity located on the Right,Medial T Great .Severity of Tissue Pre oe Debridement is: Fat layer exposed. There was a Excisional Skin/Subcutaneous Tissue Debridement with a total area  of 1.65 sq cm performed by Fredirick Maudlin, MD. With the following instrument(s): Curette to remove Non-Viable tissue/material. Material removed includes Callus, Subcutaneous Tissue, and Fibrin/Exudate after achieving pain control using Lidocaine 5% topical ointment. No specimens were taken. A time out was conducted at 12:50, prior to the start of the procedure. A Minimum amount of bleeding was controlled with Pressure. The procedure was tolerated well with a pain level of 0 throughout and a pain level of 0 following the procedure. Post Debridement Measurements: 1.5cm length x 1.1cm width x 0.5cm depth; 0.648cm^3 volume. Character of Wound/Ulcer Post Debridement is improved. Severity of Tissue Post Debridement is: Fat layer exposed. Post procedure Diagnosis Wound #1: Same as  Pre-Procedure General Notes: Scribed for Dr. Celine Ahr by J.Scotton. Plan Follow-up Appointments: Return Appointment in 1 week. - Dr. Celine Ahr Rm 1 Friday 2/2 @ 12:45 pm Anesthetic: Wound #1 Right,Medial T Great: oe (In clinic) Topical Lidocaine 5% applied to wound bed - prior to debridement Bathing/ Shower/ Hygiene: May shower and wash wound with soap and water. Off-Loading: Wound #1 Right,Medial T Great: oe Other: - Diabetic shoe WOUND #1: - T Great Wound Laterality: Right, Medial oe Cleanser: Soap and Water 1 x Per Day/30 Days Discharge Instructions: May shower and wash wound with dial antibacterial soap and water prior to dressing change. Cleanser: Wound Cleanser 1 x Per Day/30 Days Discharge Instructions: Cleanse the wound with wound cleanser prior to applying a clean dressing using gauze sponges, not tissue or cotton balls. Prim Dressing: Sorbalgon AG Dressing 2x2 (in/in) 1 x Per Day/30 Days ary Discharge Instructions: Apply to wound bed as instructed Secondary Dressing: Woven Gauze Sponge, Non-Sterile 4x4 in 1 x Per Day/30 Days Discharge Instructions: Apply over primary dressing as directed. AVARY, [name] ([id number]) [id number] Page 7 of 9 Secured With: Child psychotherapist, Sterile 2x75 (in/in) 1 x Per Day/30 Days Discharge Instructions: Secure with stretch gauze as directed. Secured With: 60M Medipore Public affairs consultant Surgical T 2x10 (in/yd) 1 x Per Day/30 Days ape Discharge Instructions: Secure with tape as directed. 11/03/2022: The wound is a little bit smaller and the tissue has a better color to it, but there is still extensive nonviable subcutaneous tissue and slough present. I used a curette to debride slough, fibrinous exudate, and nonviable subcutaneous tissue from the wound, as well as pare away some callus around the wound edges. We will continue silver alginate to the wound. He will continue his hyperbaric oxygen therapy. Follow-up in  1 week. Electronic Signature(s) Signed: 11/03/2022 1:31:48 PM By: Fredirick Maudlin MD FACS Entered By: Fredirick Maudlin on 11/03/2022 13:31:48 -------------------------------------------------------------------------------- HxROS Details Patient Name: Date of Service: [facility] [name], [name] 11/03/2022 12:45 PM Medical Record Number: [id number] Patient Account Number: [id number] Date of Birth/Sex: Treating RN: 02/02/1951 (72 y.o. M) Primary Care Provider: [name] Other Clinician: Referring Provider: Treating Provider/Extender: [name] Weeks in Treatment: 3 Information Obtained From Patient Eyes Medical History: Positive for: Cataracts - rt eye Ear/Nose/Mouth/Throat Medical History: Negative for: Chronic sinus problems/congestion Hematologic/Lymphatic Medical History: Negative for: Anemia; Hemophilia; Human Immunodeficiency Virus; Lymphedema; Sickle Cell Disease Respiratory Medical History: Negative for: Aspiration; Asthma; Chronic Obstructive Pulmonary Disease (COPD); Pneumothorax; Tuberculosis Cardiovascular Medical History: Positive for: Hypertension Gastrointestinal Medical History: Negative for: Cirrhosis ; Colitis; Crohns; Hepatitis A; Hepatitis B; Hepatitis C Endocrine Medical History: Positive for: Type II Diabetes Time with diabetes: 2000 Treated with: Insulin Blood sugar tested every day: Yes Tested Yichen [name], [name] ([id number]) [id number] Page 8 of 9 Genitourinary Medical History: Negative  for: End Stage Renal Disease Past Medical History Notes: CKD IV Immunological Medical History: Negative for: Lupus Erythematosus; Raynauds; Scleroderma Integumentary (Skin) Medical History: Negative for: History of Burn Musculoskeletal Medical History: Positive for: Osteomyelitis - MRI confirmed (08/16/22) Negative for: Gout Neurologic Medical History: Positive for: Neuropathy Psychiatric Medical History: Positive for:  Confinement Anxiety Negative for: Anorexia/bulimia HBO Extended History Items Eyes: Cataracts Immunizations Pneumococcal Vaccine: Received Pneumococcal Vaccination: Yes Received Pneumococcal Vaccination On or After 60th Birthday: Yes Implantable Devices None Hospitalization / Surgery History Type of Hospitalization/Surgery AV fistula place left upper arm 10/20/22 Family and Social History Cancer: No; Diabetes: Yes - Mother,Siblings; Heart Disease: Yes - Siblings; Hereditary Spherocytosis: No; Hypertension: Yes - Siblings; Kidney Disease: No; Lung Disease: No; Seizures: Yes - Father; Stroke: Yes - Siblings; Thyroid Problems: No; Tuberculosis: No; Former smoker; Marital Status - Married; Alcohol Use: Rarely; Drug Use: No History; Caffeine Use: Daily; Financial Concerns: No; Food, Clothing or Shelter Needs: No; Support System Lacking: No; Transportation Concerns: No Electronic Signature(s) Signed: 11/03/2022 2:02:28 PM By: Fredirick Maudlin MD FACS Entered By: Fredirick Maudlin on 11/03/2022 13:29:38 -------------------------------------------------------------------------------- SuperBill Details Patient Name: Date of Service: [name], [name] 11/03/2022 Medical Record Number: [id number] Patient Account Number: [id number] Date of Birth/Sex: Treating RN: 13-Oct-1950 (72 y.o. M) Primary Care Provider: [name] Other Clinician: Referring Provider: Treating Provider/Extender: Levin Erp in Treatment: 3 [place], Jerris ([id number]) [id number] Page 9 of 9 Diagnosis Coding ICD-10 Codes Code Description E52.778 Non-pressure chronic ulcer of other part of right foot with fat layer exposed E11.621 Type 2 diabetes mellitus with foot ulcer N18.5 Chronic kidney disease, stage 5 M86.9 Osteomyelitis, unspecified E11.42 Type 2 diabetes mellitus with diabetic polyneuropathy I10 Essential (primary) hypertension Facility Procedures : CPT4 Code:  [id number] Description: 44315 - DEB SUBQ TISSUE 20 SQ CM/< ICD-10 Diagnosis Description L97.512 Non-pressure chronic ulcer of other part of right foot with fat layer exposed Modifier: Quantity: 1 Physician Procedures : CPT4 Code Description Modifier [id number] - WC PHYS LEVEL 3 - EST PT 25 ICD-10 Diagnosis Description L97.512 Non-pressure chronic ulcer of other part of right foot with fat layer exposed E11.621 Type 2 diabetes mellitus with foot ulcer M86.9  Osteomyelitis, unspecified N18.5 Chronic kidney disease, stage 5 Quantity: 1 : [id number] - WC PHYS SUBQ TISS 20 SQ CM ICD-10 Diagnosis Description L97.512 Non-pressure chronic ulcer of other part of right foot with fat layer exposed Quantity: 1 Electronic Signature(s) Signed: 11/03/2022 1:32:08 PM By: Fredirick Maudlin MD FACS Entered By: Fredirick Maudlin on 11/03/2022 13:32:08

## 2022-11-04 NOTE — Progress Notes (Signed)
Marquez, Child ([id number]) [id number] Page 1 of 1 Visit Report for 11/03/2022 SuperBill Details Patient Name: Date of Service: [name], [name] 11/03/2022 Medical Record Number: [id number] Patient Account Number: [id number] Date of Birth/Sex: Treating RN: [birth year] (72 y.o. 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, stage 5 M86.9 Osteomyelitis, unspecified E11.42 Type 2 diabetes mellitus with diabetic polyneuropathy I10 Essential (primary) hypertension Facility Procedures CPT4 Code Description Modifier Quantity [id number]-(Facility Use Only) HBOT full body chamber, 13mn , 4 Physician Procedures Quantity CPT4 Code Description Modifier [id number] 85929- WC PHYS HYPERBARIC OXYGEN THERAPY 1 ICD-10 Diagnosis Description M86.9 Osteomyelitis, unspecified E11.621 Type 2 diabetes mellitus with foot ulcer L97.512 Non-pressure chronic ulcer of other part of right foot with fat layer exposed Electronic Signature(s) Signed: 11/03/2022 4:53:13 PM By: CFredirick MaudlinMD FACS Signed: 11/03/2022 6:10:45 PM By: DDeon PillingRN, BSN Entered By: DDeon Pillingon 11/03/2022 16:40:22

## 2022-11-04 NOTE — Progress Notes (Signed)
[place], Sierra ([id number]) [id number] Page 1 of 7 Visit Report for 11/03/2022 Arrival Information Details Patient Name: Date of Service: [name], [name] 11/03/2022 12:45 PM Medical Record Number: [id number] Patient Account Number: [id number] Date of Birth/Sex: Treating RN: [birth year] (72 y.o. [name] Primary Care Kylani Wires: [name] Other Clinician: Referring Camielle Sizer: Treating Zaara Sprowl/Extender: [name] Weeks in Treatment: 3 Visit Information History Since Last Visit Added or deleted any medications: No Patient Arrived: [name] Any new allergies or adverse reactions: No Arrival Time: 12:41 Had a fall or experienced change in No Accompanied By: self activities of daily living that may affect Transfer Assistance: None risk of falls: Patient Requires Transmission-Based Precautions: No Signs or symptoms of abuse/neglect since last visito No Patient Has Alerts: No Hospitalized since last visit: No Implantable device outside of the clinic excluding No cellular tissue based products placed in the center since last visit: Has Dressing in Place as Prescribed: Yes Pain Present Now: No Electronic Signature(s) Signed: 11/03/2022 5:32:37 PM By: Dellie Catholic RN Entered By: Dellie Catholic on 11/03/2022 12:41:41 -------------------------------------------------------------------------------- Encounter Discharge Information Details Patient Name: Date of Service: [facility], [name] 11/03/2022 12:45 PM Medical Record Number: [id number] Patient Account Number: [id number] Date of Birth/Sex: Treating RN: 14-Jun-1951 (72 y.o. [name] Primary Care Daleena Rotter: [name] Other Clinician: Referring Yakira Duquette: Treating Jerol Rufener/Extender: [name] Weeks in Treatment: 3 Encounter Discharge Information Items Post Procedure Vitals Discharge Condition: Stable Temperature (F): 97.9 Ambulatory Status: Ambulatory Pulse  (bpm): 90 Discharge Destination: Home Respiratory Rate (breaths/min): 16 Transportation: Private Auto Blood Pressure (mmHg): 156/77 Accompanied By: self Schedule Follow-up Appointment: Yes Clinical Summary of Care: Patient Declined Electronic Signature(s) Signed: 11/03/2022 5:32:37 PM By: Dellie Catholic RN Entered By: Dellie Catholic on 11/03/2022 17:25:50 Towery, Janiel ([id number]) [id number] Page 2 of 7 -------------------------------------------------------------------------------- Lower Extremity Assessment Details Patient Name: Date of ServiceJodene [name], [name] 11/03/2022 12:45 PM Medical Record Number: [id number] Patient Account Number: [id number] Date of Birth/Sex: Treating RN: [birth year] (72 y.o. [name] Primary Care Nereyda Bowler: [name] Other Clinician: Referring Camdon Saetern: Treating Akiba Melfi/Extender: [name] Weeks in Treatment: 3 Edema Assessment Assessed: [Left: No] [Right: No] Edema: [Left: N] [Right: o] Calf Left: Right: Point of Measurement: From Medial Instep 37.5 cm Ankle Left: Right: Point of Measurement: From Medial Instep 23.5 cm Vascular Assessment Pulses: Dorsalis Pedis Palpable: [Right:Yes] Electronic Signature(s) Signed: 11/03/2022 5:32:37 PM By: Dellie Catholic RN Entered By: Dellie Catholic on 11/03/2022 12:41:50 -------------------------------------------------------------------------------- Multi Wound Chart Details Patient Name: Date of Service: [facility], [name] 11/03/2022 12:45 PM Medical Record Number: [id number] Patient Account Number: [id number] Date of Birth/Sex: Treating RN: Feb 11, 1951 (72 y.o. M) Primary Care Rondrick Barreira: [name] Other Clinician: Referring Taliesin Hartlage: Treating Markel Kurtenbach/Extender: [name] Weeks in Treatment: 3 Vital Signs Height(in): 72 Pulse(bpm): 90 Weight(lbs): 185 Blood Pressure(mmHg): 156/77 Body Mass Index(BMI): 25.1 Temperature(F):  97.9 Respiratory Rate(breaths/min): 16 [1:Photos:] [N/A:N/A] Right, Medial T Great oe N/A N/A Wound Location: Pressure Injury N/A N/A Wounding Event: Diabetic Wound/Ulcer of the Lower N/A N/A Primary Etiology: Extremity Acute Osteomyelitis N/A N/A Secondary Etiology: Cataracts, Hypertension, Type II N/A N/A Comorbid History: Diabetes, Osteomyelitis, Neuropathy, Confinement Anxiety 08/16/2022 N/A N/A Date Acquired: 3 N/A N/A Weeks of Treatment: Open N/A N/A Wound Status: No N/A N/A Wound Recurrence: 1.5x1.1x0.5 N/A N/A Measurements L x W x D (cm) 1.296 N/A N/A A (cm) : rea 0.648 N/A N/A Volume (cm) : -371.30% N/A N/A % Reduction in A rea: -489.10% N/A N/A %  Reduction in Volume: Grade 3 N/A N/A Classification: Medium N/A N/A Exudate A mount: Serosanguineous N/A N/A Exudate Type: red, brown N/A N/A Exudate Color: Distinct, outline attached N/A N/A Wound Margin: Small (1-33%) N/A N/A Granulation A mount: Red N/A N/A Granulation Quality: Large (67-100%) N/A N/A Necrotic A mount: Fat Layer (Subcutaneous Tissue): Yes N/A N/A Exposed Structures: Tendon: Yes Fascia: No Muscle: No Joint: No Bone: No None N/A N/A Epithelialization: Debridement - Selective/Open Wound N/A N/A Debridement: Pre-procedure Verification/Time Out 12:50 N/A N/A Taken: Lidocaine 5% topical ointment N/A N/A Pain Control: Callus N/A N/A Tissue Debrided: Non-Viable Tissue N/A N/A Level: 1.65 N/A N/A Debridement A (sq cm): rea Curette N/A N/A Instrument: Minimum N/A N/A Bleeding: Pressure N/A N/A Hemostasis A chieved: 0 N/A N/A Procedural Pain: 0 N/A N/A Post Procedural Pain: Procedure was tolerated well N/A N/A Debridement Treatment Response: 1.5x1.1x0.5 N/A N/A Post Debridement Measurements L x W x D (cm) 0.648 N/A N/A Post Debridement Volume: (cm) Callus: Yes N/A N/A Periwound Skin Texture: Maceration: Yes N/A N/A Periwound Skin Moisture: No Abnormalities  Noted N/A N/A Periwound Skin Color: No Abnormality N/A N/A Temperature: Debridement N/A N/A Procedures Performed: Treatment Notes Electronic Signature(s) Signed: 11/03/2022 1:28:09 PM By: Fredirick Maudlin MD FACS Entered By: Fredirick Maudlin on 11/03/2022 13:28:09 -------------------------------------------------------------------------------- Multi-Disciplinary Care Plan Details Patient Name: Date of Service: [facility], [name] 11/03/2022 12:45 PM Medical Record Number: [id number] Patient Account Number: [id number] Date of Birth/Sex: Treating RN: Feb 13, 1951 (72 y.o. [name] Primary Care Shadoe Bethel: [name] Other Clinician: Referring Keidan Aumiller: Treating Marquies Wanat/Extender: [name] Weeks in Treatment: 3 Multidisciplinary Care Plan reviewed with physician GLEB, MCGUIRE ([id number]) [id number] Page 4 of 7 Active Inactive Nutrition Nursing Diagnoses: Impaired glucose control: actual or potential Goals: Patient/caregiver will maintain therapeutic glucose control Date Initiated: 10/12/2022 Target Resolution Date: 12/28/2022 Goal Status: Active Interventions: Provide education on elevated blood sugars and impact on wound healing Provide education on nutrition Treatment Activities: Dietary management education, guidance and counseling : 10/12/2022 Giving encouragement to exercise : 10/12/2022 Notes: Osteomyelitis Nursing Diagnoses: Infection: osteomyelitis Goals: Patient/caregiver will verbalize understanding of disease process and disease management Date Initiated: 10/12/2022 Target Resolution Date: 11/29/2022 Goal Status: Active Interventions: Assess for signs and symptoms of osteomyelitis resolution every visit Provide education on osteomyelitis Treatment Activities: MRI : 08/16/2022 Notes: Wound/Skin Impairment Nursing Diagnoses: Impaired tissue integrity Goals: Ulcer/skin breakdown will have a volume reduction of 30%  by week 4 Date Initiated: 10/12/2022 Target Resolution Date: 11/30/2022 Goal Status: Active Interventions: Assess ulceration(s) every visit Provide education on smoking Treatment Activities: Skin care regimen initiated : 10/12/2022 Smoking cessation education : 10/12/2022 Notes: Electronic Signature(s) Signed: 11/03/2022 5:32:37 PM By: Dellie Catholic RN Entered By: Dellie Catholic on 11/03/2022 17:24:02 -------------------------------------------------------------------------------- Pain Assessment Details Patient Name: Date of Service: [facility], [name] 11/03/2022 12:45 PM Medical Record Number: [id number] Patient Account Number: [id number] DAROL, CUSH ([id number]) [id number] Page 5 of 7 Date of Birth/Sex: Treating RN: November 25, 1950 (72 y.o. [name] Primary Care Charene Mccallister: Other Clinician: [name] Referring Brighid Koch: Treating Emanuel Campos/Extender: [name] Weeks in Treatment: 3 Active Problems Location of Pain Severity and Description of Pain Patient Has Paino No Site Locations Pain Management and Medication Current Pain Management: Electronic Signature(s) Signed: 11/03/2022 5:32:37 PM By: Dellie Catholic RN Entered By: Dellie Catholic on 11/03/2022 12:41:18 -------------------------------------------------------------------------------- Patient/Caregiver Education Details Patient Name: Date of Service: [name], [name] 2/2/2024andnbsp12:45 PM Medical Record Number: [id number] Patient Account Number: [id number] Date of Birth/Gender: Treating RN: 19-Sep-1951 [phone]  y.o. [name] Primary Care Physician: [name] Other Clinician: Referring Physician: Treating Physician/Extender: Levin Erp in Treatment: 3 Education Assessment Education Provided To: Patient Education Topics Provided Wound/Skin Impairment: Methods: Explain/Verbal Responses: Return demonstration correctly Electronic  Signature(s) Signed: 11/03/2022 5:32:37 PM By: Dellie Catholic RN Entered By: Dellie Catholic on 11/03/2022 17:24:19 Se[place]ano, Elijha ([id number]) [id number] Page 6 of 7 -------------------------------------------------------------------------------- Wound Assessment Details Patient Name: Date of ServiceJodene [name], [name] 11/03/2022 12:45 PM Medical Record Number: [id number] Patient Account Number: [id number] Date of Birth/Sex: Treating RN: 1951/01/14 (72 y.o. [name] Primary Care Alois Mincer: [name] Other Clinician: Referring Caleb Prigmore: Treating Sameen Leas/Extender: [name] Weeks in Treatment: 3 Wound Status Wound Number: 1 Primary Diabetic Wound/Ulcer of the Lower Extremity Etiology: Wound Location: Right, Medial T Great oe Secondary Acute Osteomyelitis Wounding Event: Pressure Injury Etiology: Date Acquired: 08/16/2022 Wound Open Weeks Of Treatment: 3 Status: Clustered Wound: No Comorbid Cataracts, Hypertension, Type II Diabetes, Osteomyelitis, History: Neuropathy, Confinement Anxiety Photos Wound Measurements Length: (cm) 1.5 Width: (cm) 1.1 Depth: (cm) 0.5 Area: (cm) 1.296 Volume: (cm) 0.648 % Reduction in Area: -371.3% % Reduction in Volume: -489.1% Epithelialization: None Tunneling: No Undermining: No Wound Description Classification: Grade 3 Wound Margin: Distinct, outline attached Exudate Amount: Medium Exudate Type: Serosanguineous Exudate Color: red, brown Foul Odor After Cleansing: No Slough/Fibrino Yes Wound Bed Granulation Amount: Small (1-33%) Exposed Structure Granulation Quality: Red Fascia Exposed: No Necrotic Amount: Large (67-100%) Fat Layer (Subcutaneous Tissue) Exposed: Yes Necrotic Quality: Adherent Slough Tendon Exposed: Yes Muscle Exposed: No Joint Exposed: No Bone Exposed: No Periwound Skin Texture Texture Color No Abnormalities Noted: Yes No Abnormalities Noted: Yes Moisture  Temperature / Pain No Abnormalities Noted: No Temperature: No Abnormality Maceration: Yes Electronic Signature(s) Signed: 11/03/2022 5:32:37 PM By: Dellie Catholic RN Entered By: Dellie Catholic on 11/03/2022 12:48:08 Leiva, [name] ([id number]) [id number] Page 7 of 7 -------------------------------------------------------------------------------- Vitals Details Patient Name: Date of ServiceJodene [name], [name] 11/03/2022 12:45 PM Medical Record Number: [id number] Patient Account Number: [id number] Date of Birth/Sex: Treating RN: [birth year] (72 y.o. [name] Primary Care Garet Hooton: [name] Other Clinician: Referring Tanner Yeley: Treating Sankalp Ferrell/Extender: [name] Weeks in Treatment: 3 Vital Signs Time Taken: 12:37 Temperature (F): 97.9 Height (in): 72 Pulse (bpm): 90 Weight (lbs): 185 Respiratory Rate (breaths/min): 16 Body Mass Index (BMI): 25.1 Blood Pressure (mmHg): 156/77 Reference Range: 80 - 120 mg / dl Electronic Signature(s) Signed: 11/03/2022 5:32:37 PM By: Dellie Catholic RN Entered By: Dellie Catholic on 11/03/2022 12:41:11

## 2022-11-04 NOTE — Progress Notes (Signed)
[place], Rainey ([id number]) [id number] Page 1 of 1 Visit Report for 10/30/2022 Physician Orders Details Patient Name: Date of Service: [name], [name] [name] 10/30/2022 12:00 PM Medical Record Number: [id number] Patient Account Number: [id number] Date of Birth/Sex: Treating RN: [birth year] (72 y.o. [name] Primary Care Provider: [name] Other Clinician: Valeria Batman Referring Provider: Treating Provider/Extender: [name] Weeks in Treatment: 2 Verbal / Phone Orders: No Diagnosis Coding Follow-up Appointments ppointment in 1 week. - Dr. Celine Ahr Rm 1 Return A Friday 2/2 @ 12:45 pm Anesthetic Wound #1 Right,Medial T Great oe (In clinic) Topical Lidocaine 5% applied to wound bed - prior to debridement Bathing/ Shower/ Hygiene May shower and wash wound with soap and water. Off-Loading Wound #1 Right,Medial T Great oe Other: - Diabetic shoe Wound Treatment Wound #1 - T Great oe Wound Laterality: Right, Medial Cleanser: Soap and Water 1 x Per Day/30 Days Discharge Instructions: May shower and wash wound with dial antibacterial soap and water prior to dressing change. Cleanser: Wound Cleanser 1 x Per Day/30 Days Discharge Instructions: Cleanse the wound with wound cleanser prior to applying a clean dressing using gauze sponges, not tissue or cotton balls. Prim Dressing: Sorbalgon AG Dressing 2x2 (in/in) 1 x Per Day/30 Days ary Discharge Instructions: Apply to wound bed as instructed Secondary Dressing: Woven Gauze Sponge, Non-Sterile 4x4 in 1 x Per Day/30 Days Discharge Instructions: Apply over primary dressing as directed. Secured With: Child psychotherapist, Sterile 2x75 (in/in) 1 x Per Day/30 Days Discharge Instructions: Secure with stretch gauze as directed. Secured With: 38M Medipore Public affairs consultant Surgical T 2x10 (in/yd) 1 x Per Day/30 Days ape Discharge Instructions: Secure with tape as directed. Electronic  Signature(s) Signed: 11/03/2022 2:02:28 PM By: Fredirick Maudlin MD FACS Signed: 11/03/2022 5:32:37 PM By: Dellie Catholic RN Entered By: Dellie Catholic on 11/03/2022 12:59:49

## 2022-11-04 NOTE — Progress Notes (Signed)
[place], Eytan ([id number]) [id number] Page 1 of 2 Visit Report for 11/03/2022 Arrival Information Details Patient Name: Date of Service: [name], [name] 11/03/2022 1:30 PM Medical Record Number: [id number] Patient Account Number: [id number] Date of Birth/Sex: Treating RN: Oct 23, 1950 (72 y.o. [name], [name] Primary Care Mkayla Steele: [name] Other Clinician: Referring Honor Fairbank: Treating Yolette Hastings/Extender: [name] Weeks in Treatment: 3 Visit Information History Since Last Visit Added or deleted any medications: No Patient Arrived: [name] Any new allergies or adverse reactions: No Arrival Time: 14:00 Had a fall or experienced change in No Accompanied By: self activities of daily living that may affect Transfer Assistance: None risk of falls: Patient Identification Verified: Yes Signs or symptoms of abuse/neglect since last visito No Secondary Verification Process Completed: Yes Hospitalized since last visit: No Patient Requires Transmission-Based Precautions: No Implantable device outside of the clinic excluding No Patient Has Alerts: No cellular tissue based products placed in the center since last visit: Pain Present Now: No Electronic Signature(s) Signed: 11/03/2022 6:10:45 PM By: Deon Pilling RN, BSN Entered By: Deon Pilling on 11/03/2022 16:35:59 -------------------------------------------------------------------------------- Encounter Discharge Information Details Patient Name: Date of Service: [name] [name], [name] 11/03/2022 1:30 PM Medical Record Number: [id number] Patient Account Number: [id number] Date of Birth/Sex: Treating RN: Dec 27, 1950 (72 y.o. [name] Primary Care Dorthy Magnussen: [name] Other Clinician: Referring Timber Marshman: Treating Spencer Cardinal/Extender: [name] Weeks in Treatment: 3 Encounter Discharge Information Items Discharge Condition: Stable Ambulatory Status: Cane Discharge Destination:  Home Transportation: Private Auto Accompanied By: self Schedule Follow-up Appointment: Yes Clinical Summary of Care: Electronic Signature(s) Signed: 11/03/2022 6:10:45 PM By: Deon Pilling RN, BSN Entered By: Deon Pilling on 11/03/2022 16:40:54 [name], [name] ([id number]) [id number] Page 2 of 2 -------------------------------------------------------------------------------- Vitals Details Patient Name: Date of Service: [name], [name] 11/03/2022 1:30 PM Medical Record Number: [id number] Patient Account Number: [id number] Date of Birth/Sex: Treating RN: 20-Jun-1951 (72 y.o. [name] Primary Care Melesio Madara: [name] Other Clinician: Referring Samayah Novinger: Treating Anarie Kalish/Extender: [name] Weeks in Treatment: 3 Vital Signs Time Taken: 14:10 Temperature (F): 97.9 Height (in): 72 Pulse (bpm): 90 Weight (lbs): 185 Respiratory Rate (breaths/min): 16 Body Mass Index (BMI): 25.1 Blood Pressure (mmHg): 158/69 Capillary Blood Glucose (mg/dl): 153 Reference Range: 80 - 120 mg / dl Electronic Signature(s) Signed: 11/03/2022 6:10:45 PM By: Deon Pilling RN, BSN Entered By: Deon Pilling on 11/03/2022 16:37:09

## 2022-11-04 NOTE — Progress Notes (Signed)
[name], [name] ([id number]) [id number] Page 1 of 2 Visit Report for 11/03/2022 HBO Details Patient Name: Date of Service: [name], [name] 11/03/2022 1:30 PM Medical Record Number: [id number] Patient Account Number: [id number] Date of Birth/Sex: Treating RN: 07-27-1951 (72 y.o. 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 HBO Treatment Details Treatment Number: 4 Patient Type: Outpatient Chamber Type: Monoplace Chamber Serial #: [id number] Treatment Protocol: 2.0 ATA with 90 minutes oxygen, and no air breaks Treatment Details Compression Rate Down: 1.5 psi / minute De-Compression Rate Up: 1.5 psi / minute Air breaks and breathing Decompress Decompress Compress Tx Pressure Begins Reached periods Begins Ends (leave unused spaces blank) Chamber Pressure (ATA 1 2 ------2 1 ) Clock Time (24 hr) 14:28 14:41 - - - - - - 16:11 16:20 Treatment Length: 112 (minutes) Treatment Segments: 4 Vital Signs Capillary Blood Glucose Reference Range: 80 - 120 mg / dl HBO Diabetic Blood Glucose Intervention Range: <131 mg/dl or >249 mg/dl Time Vitals Blood Respiratory Capillary Blood Glucose Pulse Action Type: Pulse: Temperature: Taken: Pressure: Rate: Glucose (mg/dl): Meter #: Oximetry (%) Taken: Pre 14:10 158/69 90 16 97.9 153 Post 16:21 145/71 82 16 97.8 133 Treatment Response Treatment Toleration: Well Treatment Completion Status: Treatment Completed without Adverse Event Physician HBO Attestation: I certify that I supervised this HBO treatment in accordance with Medicare guidelines. A trained emergency response  team is readily available per Yes hospital policies and procedures. Continue HBOT as ordered. Yes Electronic Signature(s) Signed: 11/03/2022 4:53:05 PM By: Fredirick Maudlin MD FACS Entered By: Fredirick Maudlin on 11/03/2022 16:53:05 Bound, [name] ([id number]) [id number] Page 2 of 2 -------------------------------------------------------------------------------- HBO Safety Checklist Details Patient Name: Date of ServiceJodene [name], [name] 11/03/2022 1:30 PM Medical Record Number: [id number] Patient Account Number: [id number] Date of Birth/Sex: Treating RN: 1951-08-22 (72 y.o. [name], [name] Primary Care Greg Cratty: [name] Other Clinician: Referring Samia Kukla: Treating Naaman Curro/Extender: [name] Weeks in Treatment: 3 HBO Safety Checklist Items Safety Checklist Consent Form Signed Patient voided / foley secured and emptied When did you last eato lunch today- 1345 tacos Last dose of injectable or oral agent last night Ostomy pouch emptied and vented if applicable NA All implantable devices assessed, documented and approved NA Intravenous access site secured and place NA Valuables secured Linens and cotton and cotton/polyester blend (less than 51% polyester) Personal oil-based products / skin lotions / body lotions removed Wigs or hairpieces removed NA Smoking or tobacco materials removed NA Books / newspapers / magazines / loose paper removed Cologne, aftershave, perfume and deodorant removed Jewelry removed (may wrap wedding band) Make-up removed NA Hair care products removed NA Battery operated devices (external) removed NA Heating patches and chemical warmers removed NA Titanium eyewear removed NA Nail polish cured greater than 10 hours NA Casting material cured greater than 10 hours NA Hearing aids removed NA Loose dentures or partials removed Prosthetics have been removed NA Patient demonstrates correct use of air  break device (if applicable) Patient concerns have been addressed Patient grounding bracelet on and cord attached to chamber Specifics for Inpatients (complete in addition to above) Medication sheet sent with patient Intravenous medications needed or due during therapy sent with patient Drainage tubes (e.g. nasogastric tube or chest tube secured  and vented) Endotracheal or Tracheotomy tube secured Cuff deflated of air and inflated with saline Airway suctioned Electronic Signature(s) Signed: 11/03/2022 6:10:45 PM By: Deon Pilling RN, BSN Entered By: Deon Pilling on 11/03/2022 16:38:33

## 2022-11-06 ENCOUNTER — Encounter (HOSPITAL_BASED_OUTPATIENT_CLINIC_OR_DEPARTMENT_OTHER): Payer: No Typology Code available for payment source | Admitting: General Surgery

## 2022-11-06 DIAGNOSIS — M86671 Other chronic osteomyelitis, right ankle and foot: Secondary | ICD-10-CM | POA: Diagnosis not present

## 2022-11-06 DIAGNOSIS — E11621 Type 2 diabetes mellitus with foot ulcer: Secondary | ICD-10-CM | POA: Diagnosis not present

## 2022-11-06 LAB — GLUCOSE, CAPILLARY
Glucose-Capillary: 153 mg/dL — ABNORMAL HIGH (ref 70–99)
Glucose-Capillary: 133 mg/dL — ABNORMAL HIGH (ref 70–99)
Glucose-Capillary: 329 mg/dL — ABNORMAL HIGH (ref 70–99)
Glucose-Capillary: 137 mg/dL — ABNORMAL HIGH (ref 70–99)

## 2022-11-06 NOTE — Progress Notes (Signed)
[name], [name] ([id number]) [id number] Page 1 of 2 Visit Report for 11/06/2022 Arrival Information Details Patient Name: Date of Service: [name], [name] 11/06/2022 12:00 PM Medical Record Number: [id number] Patient Account Number: [id number] Date of Birth/Sex: Treating RN: Jun 08, 1951 (72 y.o. [name] Primary Care Shamina Etheridge: [name] Other Clinician: Valeria Batman Referring Forest Pruden: Treating Brookelin Felber/Extender: [name] Weeks in Treatment: 3 Visit Information History Since Last Visit All ordered tests and consults were completed: Yes Patient Arrived: [name] Added or deleted any medications: No Arrival Time: 13:17 Any new allergies or adverse reactions: No Accompanied By: None Had a fall or experienced change in No Transfer Assistance: None activities of daily living that may affect Patient Identification Verified: Yes risk of falls: Secondary Verification Process Completed: Yes Signs or symptoms of abuse/neglect since last visito No Patient Requires Transmission-Based Precautions: No Hospitalized since last visit: No Patient Has Alerts: No Implantable device outside of the clinic excluding No cellular tissue based products placed in the center since last visit: Pain Present Now: No Electronic Signature(s) Signed: 11/06/2022 3:09:47 PM By: Valeria Batman EMT Entered By: Valeria Batman on 11/06/2022 15:09:47 -------------------------------------------------------------------------------- Encounter Discharge Information Details Patient Name: Date of Service: [name], [name] 11/06/2022 12:00 PM Medical Record Number: [id number] Patient Account Number: [id number] Date of Birth/Sex: Treating RN: 03/09/1951 (72 y.o. [name] Primary Care Arbie Blankley: [name] Other Clinician: Valeria Batman Referring Ceazia Harb: Treating Lindel Marcell/Extender: [name] Weeks in Treatment: 3 Encounter Discharge  Information Items Discharge Condition: Stable Ambulatory Status: Cane Discharge Destination: Home Transportation: Private Auto Accompanied By: None Schedule Follow-up Appointment: Yes Clinical Summary of Care: Electronic Signature(s) Signed: 11/06/2022 3:53:40 PM By: Valeria Batman EMT Entered By: Valeria Batman on 11/06/2022 15:53:40 [name], [name] ([id number]) [id number] Page 2 of 2 -------------------------------------------------------------------------------- Vitals Details Patient Name: Date of ServiceJodene [name], [name] 11/06/2022 12:00 PM Medical Record Number: [id number] Patient Account Number: [id number] Date of Birth/Sex: Treating RN: August 20, 1951 (72 y.o. [name] Primary Care Winter Jocelyn: [name] Other Clinician: Valeria Batman Referring Hilario Robarts: Treating Nakyra Bourn/Extender: [name] Weeks in Treatment: 3 Vital Signs Time Taken: 13:24 Capillary Blood Glucose (mg/dl): 329 Height (in): 72 Reference Range: 80 - 120 mg / dl Weight (lbs): 185 Body Mass Index (BMI): 25.1 Electronic Signature(s) Signed: 11/06/2022 3:10:47 PM By: Valeria Batman EMT Entered By: Valeria Batman on 11/06/2022 15:10:47

## 2022-11-07 ENCOUNTER — Encounter (HOSPITAL_BASED_OUTPATIENT_CLINIC_OR_DEPARTMENT_OTHER): Payer: No Typology Code available for payment source | Admitting: General Surgery

## 2022-11-07 DIAGNOSIS — E11621 Type 2 diabetes mellitus with foot ulcer: Secondary | ICD-10-CM | POA: Diagnosis not present

## 2022-11-07 DIAGNOSIS — M86671 Other chronic osteomyelitis, right ankle and foot: Secondary | ICD-10-CM | POA: Diagnosis not present

## 2022-11-07 LAB — GLUCOSE, CAPILLARY
Glucose-Capillary: 168 mg/dL — ABNORMAL HIGH (ref 70–99)
Glucose-Capillary: 185 mg/dL — ABNORMAL HIGH (ref 70–99)

## 2022-11-07 NOTE — Progress Notes (Signed)
[name], [name] ([id number]) [id number] Page 1 of 1 Visit Report for 11/07/2022 SuperBill Details Patient Name: Date of Service: [name], [name] [name] 11/07/2022 Medical Record Number: [id number] Patient Account Number: [id number] Date of Birth/Sex: Treating RN: [birth year] (72 y.o. 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, stage 5 M86.9 Osteomyelitis, unspecified E11.42 Type 2 diabetes mellitus with diabetic polyneuropathy I10 Essential (primary) hypertension Facility Procedures CPT4 Code Description Modifier Quantity [id number] G0277-(Facility Use Only) HBOT full body chamber, 66mn , 4 ICD-10 Diagnosis Description M86.9 Osteomyelitis, unspecified L97.512 Non-pressure chronic ulcer of other part of right foot with fat layer exposed E11.621 Type 2 diabetes mellitus with foot ulcer E11.42 Type 2 diabetes mellitus with diabetic polyneuropathy Physician Procedures Quantity CPT4 Code Description Modifier [id number] 70786- WC PHYS HYPERBARIC OXYGEN THERAPY 1 ICD-10 Diagnosis Description M86.9 Osteomyelitis, unspecified L97.512 Non-pressure chronic ulcer of other part of right foot with fat layer exposed E11.621 Type 2 diabetes mellitus with foot ulcer E11.42 Type 2 diabetes mellitus with diabetic polyneuropathy Electronic Signature(s) Signed: 11/07/2022 2:58:09 PM By: SDonavan BurnetCHT EMT BS , , Signed: 11/07/2022 3:13:36 PM By: CFredirick MaudlinMD FACS Entered By: SDonavan Burneton 11/07/2022 14:58:08

## 2022-11-07 NOTE — Progress Notes (Signed)
[name], [name] ([id number]) [id number] Page 1 of 2 Visit Report for 11/06/2022 HBO Details Patient Name: Date of Service: [name], [name] 11/06/2022 12:00 PM Medical Record Number: [id number] Patient Account Number: [id number] Date of Birth/Sex: Treating RN: 1951-05-07 (72 y.o. 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 HBO Treatment Details Treatment Number: 5 Patient Type: Outpatient Chamber Type: Monoplace Chamber Serial #: [id number] Treatment Protocol: 2.0 ATA with 90 minutes oxygen, and no air breaks Treatment Details Compression Rate Down: 2.0 psi / minute De-Compression Rate Up: 2.0 psi / minute Air breaks and breathing Decompress Decompress Compress Tx Pressure Begins Reached periods Begins Ends (leave unused spaces blank) Chamber Pressure (ATA 1 2 ------2 1 ) Clock Time (24 hr) 13:44 13:53 - - - - - - 15:23 15:31 Treatment Length: 107 (minutes) Treatment Segments: 4 Vital Signs Capillary Blood Glucose Reference Range: 80 - 120 mg / dl HBO Diabetic Blood Glucose Intervention Range: <131 mg/dl or >249 mg/dl Time Vitals Blood Respiratory Capillary Blood Glucose Pulse Action Type: Pulse: Temperature: Taken: Pressure: Rate: Glucose (mg/dl): Meter #: Oximetry (%) Taken: Pre 13:24 329 Post 15:36 137/78 89 18 98.6 137 Treatment Response Treatment Toleration: Well Treatment Completion Status: Treatment Completed without Adverse Event Treatment Notes On arrival the patient blood sugar was 329. He stated that he had a gravy biscuit before coming in for treatment. He also  stated that he had only taken 10 unite of 70/30 NovoLog. I spoke with Dr. Celine Ahr, and she spoke with the patient about his diet. The patient used Afrin before treatment. Physician HBO Attestation: I certify that I supervised this HBO treatment in accordance with Medicare guidelines. A trained emergency response team is readily available per Yes hospital policies and procedures. Continue HBOT as ordered. Yes Electronic Signature(s) Signed: 11/06/2022 4:30:07 PM By: Fredirick Maudlin MD FACS Previous Signature: 11/06/2022 3:54:31 PM Version By: Valeria Batman EMT Previous Signature: 11/06/2022 3:50:32 PM Version By: Valeria Batman EMT Previous Signature: 11/06/2022 3:17:45 PM Version By: Valeria Batman EMT Entered By: Fredirick Maudlin on 11/06/2022 16:30:07 Heiney, Naif ([id number]) [id number] Page 2 of 2 -------------------------------------------------------------------------------- HBO Safety Checklist Details Patient Name: Date of ServiceJodene [name], [name] 11/06/2022 12:00 PM Medical Record Number: [id number] Patient Account Number: [id number] Date of Birth/Sex: Treating RN: [birth year] (72 y.o. [name] Primary Care Pavan Bring: [name] Other Clinician: Valeria Batman Referring Oryon Gary: Treating Lennie Dunnigan/Extender: [name] Weeks in Treatment: 3 HBO Safety Checklist Items Safety Checklist Consent Form Signed Patient voided / foley secured and emptied When did you last eato 1230 Last dose of injectable or oral agent 1230 Ostomy pouch emptied and vented if applicable NA All implantable devices assessed, documented and approved Libre2 Dialysis shunt Intravenous access site secured and place NA Valuables secured Linens and cotton and cotton/polyester blend (less than 51% polyester) Personal oil-based products / skin lotions / body lotions removed Wigs or hairpieces removed NA Smoking or tobacco materials removed Books / newspapers /  magazines / loose paper removed Cologne, aftershave, perfume and deodorant removed Jewelry removed (may wrap wedding band) Make-up removed NA Hair care products removed Battery operated devices (external) removed Heating patches and chemical warmers removed Titanium eyewear removed NA Nail polish cured greater than 10  hours NA Casting material cured greater than 10 hours NA Hearing aids removed NA Loose dentures or partials removed removed by patient Prosthetics have been removed NA Patient demonstrates correct use of air break device (if applicable) Patient concerns have been addressed Patient grounding bracelet on and cord attached to chamber Specifics for Inpatients (complete in addition to above) Medication sheet sent with patient NA Intravenous medications needed or due during therapy sent with patient NA Drainage tubes (e.g. nasogastric tube or chest tube secured and vented) NA Endotracheal or Tracheotomy tube secured NA Cuff deflated of air and inflated with saline NA Airway suctioned NA Notes The safety checklist was done before the treatment was started. Electronic Signature(s) Signed: 11/06/2022 3:13:18 PM By: Valeria Batman EMT Entered By: Valeria Batman on 11/06/2022 15:13:18

## 2022-11-07 NOTE — Progress Notes (Signed)
Rector, Haralambos ([id number]) [id number] Page 1 of 2 Visit Report for 11/06/2022 Problem List Details Patient Name: Date of Service: [name], [name] 11/06/2022 12:00 PM Medical Record Number: [id number] Patient Account Number: [id number] Date of Birth/Sex: Treating RN: [birth year] (72 y.o. 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, stage 5 10/12/2022 No Yes M86.9 Osteomyelitis, unspecified 10/12/2022 No Yes E11.42 Type 2 diabetes mellitus with diabetic polyneuropathy 10/12/2022 No Yes I10 Essential (primary) hypertension 10/12/2022 No Yes Inactive Problems Resolved Problems Electronic Signature(s) Signed: 11/06/2022 3:52:56 PM By: Valeria Batman EMT Signed: 11/06/2022 4:28:07 PM By: Fredirick Maudlin MD FACS Entered By: Valeria Batman on 11/06/2022 15:52:56 -------------------------------------------------------------------------------- SuperBill Details Patient Name: Date of Service: [name], [name] 11/06/2022 Medical Record Number: [id number] Patient Account Number: [id number] Date of Birth/Sex: Treating RN: 03/18/1951 (72 y.o. 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, stage 5 M86.9 Osteomyelitis, unspecified E11.42 Type 2 diabetes mellitus with diabetic polyneuropathy I10 Essential (primary) hypertension Facility Procedures : CPT4 Code Description: [id number] G0277-(Facility Use Only) HBOT full body chamber, 51mn , ICD-10 Diagnosis Description M86.9 Osteomyelitis, unspecified L97.512 Non-pressure chronic ulcer of other part of right foot wit E11.621 Type 2 diabetes mellitus  with foot ulcer E11.42 Type 2 diabetes mellitus with diabetic polyneuropathy Modifier: h fat layer Quantity: 4 exposed Physician Procedures : CPT4 Code Description Modifier [id number] 54650- WC PHYS HYPERBARIC OXYGEN THERAPY ICD-10 Diagnosis Description M86.9 Osteomyelitis, unspecified L97.512 Non-pressure chronic ulcer of other part of right foot with fat layer E11.621 Type 2 diabetes  mellitus with foot ulcer E11.42 Type 2 diabetes mellitus with diabetic polyneuropathy Quantity: 1 exposed Electronic Signature(s) Signed: 11/06/2022 3:52:31 PM By: GValeria BatmanEMT Signed: 11/06/2022 4:28:07 PM By: CFredirick MaudlinMD FACS Entered By: GValeria Batmanon 11/06/2022 15:52:31

## 2022-11-07 NOTE — Progress Notes (Signed)
[place], Jdyn ([id number]) [id number] Page 1 of 2 Visit Report for 11/07/2022 Arrival Information Details Patient Name: Date of Service: [name], [name] [name] 11/07/2022 12:00 PM Medical Record Number: [id number] Patient Account Number: [id number] Date of Birth/Sex: Treating RN: 1951-05-30 (72 y.o. [name] Primary Care [name]: [name] Other Clinician: Donavan Burnet Referring [name]: Treating [name]/Extender: [name] Weeks in Treatment: 3 Visit Information History Since Last Visit All ordered tests and consults were completed: Yes Patient Arrived: Ambulatory Added or deleted any medications: No Arrival Time: 11:30 Any new allergies or adverse reactions: No Accompanied By: self Had a fall or experienced change in No Transfer Assistance: None activities of daily living that may affect Patient Identification Verified: Yes risk of falls: Secondary Verification Process Completed: Yes Signs or symptoms of abuse/neglect since last visito No Patient Requires Transmission-Based Precautions: No Hospitalized since last visit: No Patient Has Alerts: No Implantable device outside of the clinic excluding No cellular tissue based products placed in the center since last visit: Pain Present Now: No Electronic Signature(s) Signed: 11/07/2022 2:50:34 PM By: Donavan Burnet CHT EMT BS , , Entered By: Donavan Burnet on 11/07/2022 14:50:33 -------------------------------------------------------------------------------- Encounter Discharge Information Details Patient Name: Date of Service: [name], [name] 11/07/2022 12:00 PM Medical Record Number: [id number] Patient Account Number: [id number] Date of Birth/Sex: Treating RN: 12/07/1950 (72 y.o. [name] Primary Care [name]: [name] Other Clinician: Donavan Burnet Referring [name]: Treating [name]/Extender: [name] in Treatment:  3 Encounter Discharge Information Items Discharge Condition: Stable Ambulatory Status: Ambulatory Discharge Destination: Home Transportation: Private Auto Accompanied By: self Schedule Follow-up Appointment: No Clinical Summary of Care: Electronic Signature(s) Signed: 11/07/2022 2:58:33 PM By: Donavan Burnet CHT EMT BS , , Entered By: Donavan Burnet on 11/07/2022 14:58:33 [name] ([id number]) [id number] Page 2 of 2 -------------------------------------------------------------------------------- Vitals Details Patient Name: Date of Service: [facility] [name], [name] [name] 11/07/2022 12:00 PM Medical Record Number: [id number] Patient Account Number: [id number] Date of Birth/Sex: Treating RN: 1951-01-12 (72 y.o. [name] Primary Care Aaniyah Strohm: [name] Other Clinician: Donavan Burnet Referring Truth Wolaver: Treating Monty Spicher/Extender: [name] Weeks in Treatment: 3 Vital Signs Time Taken: 11:42 Temperature (F): 98.6 Height (in): 72 Pulse (bpm): 104 Weight (lbs): 185 Respiratory Rate (breaths/min): 18 Body Mass Index (BMI): 25.1 Blood Pressure (mmHg): 154/71 Capillary Blood Glucose (mg/dl): 168 Reference Range: 80 - 120 mg / dl Electronic Signature(s) Signed: 11/07/2022 2:51:07 PM By: Donavan Burnet CHT EMT BS , , Entered By: Donavan Burnet on 11/07/2022 14:51:07

## 2022-11-07 NOTE — Progress Notes (Signed)
[name], [name] ([id number]) [id number] Page 1 of 2 Visit Report for 11/07/2022 HBO Details Patient Name: Date of Service: [employer] [name], [name] 11/07/2022 12:00 PM Medical Record Number: [id number] Patient Account Number: [id number] Date of Birth/Sex: Treating RN: November 27, 1950 (72 y.o. 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 HBO Treatment Details Treatment Number: 6 Patient Type: Outpatient Chamber Type: Monoplace Chamber Serial #: [id number] Treatment Protocol: 2.0 ATA with 90 minutes oxygen, and no air breaks Treatment Details Compression Rate Down: 1.0 psi / minute De-Compression Rate Up: 1.5 psi / minute Air breaks and breathing Decompress Decompress Compress Tx Pressure Begins Reached periods Begins Ends (leave unused spaces blank) Chamber Pressure (ATA 1 2 ------2 1 ) Clock Time (24 hr) 12:00 12:13 - - - - - - 13:43 13:55 Treatment Length: 115 (minutes) Treatment Segments: 4 Vital Signs Capillary Blood Glucose Reference Range: 80 - 120 mg / dl HBO Diabetic Blood Glucose Intervention Range: <131 mg/dl or >249 mg/dl Type: Time Vitals Blood Pulse: Respiratory Temperature: Capillary Blood Glucose Pulse Action Taken: Pressure: Rate: Glucose (mg/dl): Meter #: Oximetry (%) Taken: Pre 11:42 154/71 104 18 98.6 168 1 patient asymptomatic for tachycardia Post 14:00 130/64 83 18 98.7 185 1 none per protocol Treatment Response Treatment Toleration: Well Treatment Completion Status: Treatment Completed without Adverse Event Treatment Notes Patient arrived and stated that he had eaten and  prepared for treatment, self-administering Afrin. Blood glucose level was 168 mg/dL. After performing safety check, patient was placed in the chamber and chamber was compressed at 1 psi/min until treatment depth reached. Patient tolerated treatment and decompression of the chamber at a rate of 1.5 psi/min. Patient denied any issues with ear equalization and/or pain. Post-treatment glucose level was 185 mg/dL. Patient was stable upon discharge. Physician HBO Attestation: I certify that I supervised this HBO treatment in accordance with Medicare guidelines. A trained emergency response team is readily available per Yes hospital policies and procedures. Continue HBOT as ordered. Yes Electronic Signature(s) Signed: 11/07/2022 3:15:16 PM By: Fredirick Maudlin MD FACS Previous Signature: 11/07/2022 2:57:16 PM Version By: Donavan Burnet CHT EMT BS , , Entered By: Fredirick Maudlin on 11/07/2022 15:15:16 [name] ([id number]) [id number] Page 2 of 2 -------------------------------------------------------------------------------- HBO Safety Checklist Details Patient Name: Date of ServiceJodene [name], [name] 11/07/2022 12:00 PM Medical Record Number: [id number] Patient Account Number: [id number] Date of Birth/Sex: Treating RN: 11/22/1950 (72 y.o. [name] Primary Care Doris Mcgilvery: [name] Other Clinician: Donavan Burnet Referring Taim Wurm: Treating Rahmel Nedved/Extender: [name] Weeks in Treatment: 3 HBO Safety Checklist Items Safety Checklist Consent Form Signed Patient voided / foley secured and emptied When did you last eato 1000 Last dose of injectable or oral agent yesterday Ostomy pouch emptied and vented if applicable NA All implantable devices assessed, documented and approved NA Intravenous access site secured and place NA Valuables secured Linens and cotton and cotton/polyester blend (less than 51% polyester) Personal  oil-based products / skin lotions / body lotions removed Wigs or hairpieces removed NA Smoking or tobacco materials removed NA Books / newspapers / magazines / loose paper removed Cologne, aftershave, perfume and deodorant removed Jewelry removed (may wrap wedding band) Make-up removed NA Hair care products removed Battery operated devices (external)  removed Heating patches and chemical warmers removed Titanium eyewear removed Nail polish cured greater than 10 hours NA Casting material cured greater than 10 hours NA Hearing aids removed NA Loose dentures or partials removed dentures removed Prosthetics have been removed NA Patient demonstrates correct use of air break device (if applicable) Patient concerns have been addressed Patient grounding bracelet on and cord attached to chamber Specifics for Inpatients (complete in addition to above) Medication sheet sent with patient NA Intravenous medications needed or due during therapy sent with patient NA Drainage tubes (e.g. nasogastric tube or chest tube secured and vented) NA Endotracheal or Tracheotomy tube secured NA Cuff deflated of air and inflated with saline NA Airway suctioned NA Notes Paper version used prior to treatment. Electronic Signature(s) Signed: 11/07/2022 2:52:11 PM By: Donavan Burnet CHT EMT BS , , Entered By: Donavan Burnet on 11/07/2022 14:52:11

## 2022-11-08 ENCOUNTER — Encounter (HOSPITAL_BASED_OUTPATIENT_CLINIC_OR_DEPARTMENT_OTHER): Payer: No Typology Code available for payment source | Admitting: General Surgery

## 2022-11-08 DIAGNOSIS — M86671 Other chronic osteomyelitis, right ankle and foot: Secondary | ICD-10-CM | POA: Diagnosis not present

## 2022-11-08 DIAGNOSIS — E11621 Type 2 diabetes mellitus with foot ulcer: Secondary | ICD-10-CM | POA: Diagnosis not present

## 2022-11-08 LAB — GLUCOSE, CAPILLARY
Glucose-Capillary: 212 mg/dL — ABNORMAL HIGH (ref 70–99)
Glucose-Capillary: 127 mg/dL — ABNORMAL HIGH (ref 70–99)

## 2022-11-08 NOTE — Progress Notes (Signed)
[name], [name] ([id number]) [id number] Page 1 of 2 Visit Report for 11/08/2022 Arrival Information Details Patient Name: Date of Service: [name], [name] 11/08/2022 12:00 PM Medical Record Number: [id number] Patient Account Number: [id number] Date of Birth/Sex: Treating RN: 19-Dec-1950 (71 y.o. [name] Primary Care Briasia Flinders: [name] Other Clinician: Donavan Burnet Referring Khrystyna Schwalm: Treating Chesney Klimaszewski/Extender: [name] Weeks in Treatment: 3 Visit Information History Since Last Visit All ordered tests and consults were completed: Yes Patient Arrived: [name] Added or deleted any medications: No Arrival Time: 11:18 Any new allergies or adverse reactions: No Accompanied By: None Had a fall or experienced change in No Transfer Assistance: None activities of daily living that may affect Patient Identification Verified: Yes risk of falls: Secondary Verification Process Completed: Yes Signs or symptoms of abuse/neglect since last visito No Patient Requires Transmission-Based Precautions: No Hospitalized since last visit: No Patient Has Alerts: No Implantable device outside of the clinic excluding No cellular tissue based products placed in the center since last visit: Pain Present Now: No Electronic Signature(s) Signed: 11/08/2022 2:27:30 PM By: Valeria Batman EMT Entered By: Valeria Batman on 11/08/2022 14:27:30 -------------------------------------------------------------------------------- Encounter Discharge Information Details Patient Name: Date of Service: [name] [name], [name] 11/08/2022 12:00 PM Medical Record Number: [id number] Patient Account Number: [id number] Date of Birth/Sex: Treating RN: 11/17/1950 (72 y.o. [name] Primary Care Alessio Bogan: [name] Other Clinician: Valeria Batman Referring Mourad Cwikla: Treating Kaidon Kinker/Extender: [name] Weeks in Treatment: 3 Encounter Discharge  Information Items Discharge Condition: Stable Ambulatory Status: Cane Discharge Destination: Home Transportation: Private Auto Accompanied By: None Schedule Follow-up Appointment: Yes Clinical Summary of Care: Electronic Signature(s) Signed: 11/08/2022 2:32:31 PM By: Valeria Batman EMT Entered By: Valeria Batman on 11/08/2022 14:32:31 [name], [name] ([id number]) [id number] Page 2 of 2 -------------------------------------------------------------------------------- Vitals Details Patient Name: Date of Service: [name] [name], [name] 11/08/2022 12:00 PM Medical Record Number: [id number] Patient Account Number: [id number] Date of Birth/Sex: Treating RN: 1950-10-09 (72 y.o. [name] Primary Care [name]: [name] Other Clinician: Valeria Batman Referring Haydn Cush: Treating Einar Nolasco/Extender: [name] Weeks in Treatment: 3 Vital Signs Time Taken: 11:25 Capillary Blood Glucose (mg/dl): 212 Height (in): 72 Reference Range: 80 - 120 mg / dl Weight (lbs): 185 Body Mass Index (BMI): 25.1 Electronic Signature(s) Signed: 11/08/2022 2:27:52 PM By: Valeria Batman EMT Entered By: Valeria Batman on 11/08/2022 14:27:52

## 2022-11-09 ENCOUNTER — Ambulatory Visit: Payer: No Typology Code available for payment source | Admitting: Podiatry

## 2022-11-09 ENCOUNTER — Encounter (HOSPITAL_BASED_OUTPATIENT_CLINIC_OR_DEPARTMENT_OTHER): Payer: No Typology Code available for payment source | Admitting: General Surgery

## 2022-11-09 DIAGNOSIS — M86671 Other chronic osteomyelitis, right ankle and foot: Secondary | ICD-10-CM | POA: Diagnosis not present

## 2022-11-09 DIAGNOSIS — E11621 Type 2 diabetes mellitus with foot ulcer: Secondary | ICD-10-CM | POA: Diagnosis not present

## 2022-11-09 DIAGNOSIS — L97512 Non-pressure chronic ulcer of other part of right foot with fat layer exposed: Secondary | ICD-10-CM | POA: Diagnosis not present

## 2022-11-09 LAB — GLUCOSE, CAPILLARY
Glucose-Capillary: 129 mg/dL — ABNORMAL HIGH (ref 70–99)
Glucose-Capillary: 151 mg/dL — ABNORMAL HIGH (ref 70–99)
Glucose-Capillary: 208 mg/dL — ABNORMAL HIGH (ref 70–99)

## 2022-11-09 NOTE — Progress Notes (Signed)
[place], Dalin ([id number]) [id number] Page 1 of 2 Visit Report for 11/09/2022 Problem List Details Patient Name: Date of Service: [name], [name] 11/09/2022 12:00 PM Medical Record Number: [id number] Patient Account Number: [id number] Date of Birth/Sex: Treating RN: [birth year] (72 y.o. 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 10/12/2022 No Yes N18.5 Chronic kidney disease, stage 5 10/12/2022 No Yes M86.9 Osteomyelitis, unspecified 10/12/2022 No Yes E11.42 Type 2 diabetes mellitus with diabetic polyneuropathy 10/12/2022 No Yes I10 Essential (primary) hypertension 10/12/2022 No Yes Inactive Problems Resolved Problems Electronic Signature(s) Signed: 11/09/2022 2:47:46 PM By: Valeria Batman EMT Signed: 11/09/2022 3:13:47 PM By: Fredirick Maudlin MD FACS Entered By: Valeria Batman on 11/09/2022 14:47:46 -------------------------------------------------------------------------------- SuperBill Details Patient Name: Date of Service: [name], [name] 11/09/2022 Medical Record Number: [id number] Patient Account Number: [id number] Date of Birth/Sex: Treating RN: [birth year] (72 y.o. [name] Primary Care Provider: [name] Other Clinician: Valeria Batman Referring Provider: Treating Provider/Extender: [name] Weeks in Treatment: 4 [place], [name] ([id number]) [id number] Page 2 of 2 Diagnosis Coding ICD-10 Codes Code Description L97.514 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.621 Type 2 diabetes mellitus with  foot ulcer N18.5 Chronic kidney disease, stage 5 M86.9 Osteomyelitis, unspecified E11.42 Type 2 diabetes mellitus with diabetic polyneuropathy I10 Essential (primary) hypertension Facility Procedures : CPT4 Code Description: [id number]-(Facility Use Only) HBOT full body chamber, 50mn , ICD-10 Diagnosis Description M86.9 Osteomyelitis, unspecified 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

## 2022-11-09 NOTE — Progress Notes (Addendum)
[name], Naythen ([id number]) [id number] Page 1 of 9 Visit Report for 11/09/2022 Chief Complaint Document Details Patient Name: Date of Service: [name] [name], [name] 11/09/2022 10:00 [place] Record Number: [id number] Patient Account Number: [id number] Date of Birth/Sex: Treating RN: September 06, 1951 (72 y.o. M) Primary Care Provider: [name] Other Clinician: Referring Provider: Treating Provider/Extender: [name] Weeks in Treatment: 4 Information Obtained from: Patient Chief Complaint Patients presents for treatment of an open diabetic ulcer on the right great toe Electronic Signature(s) Signed: 11/09/2022 11:00:43 AM By: Fredirick Maudlin MD FACS Entered By: Fredirick Maudlin on 11/09/2022 11:00:43 -------------------------------------------------------------------------------- Debridement Details Patient Name: Date of Service: [name], [name] 11/09/2022 10:00 [place] Record Number: [id number] Patient Account Number: [id number] Date of Birth/Sex: Treating RN: March 08, 1951 (72 y.o. [name] Primary Care Provider: [name] Other Clinician: Referring Provider: Treating Provider/Extender: [name] Weeks in Treatment: 4 Debridement Performed for Assessment: Wound #1 Right,Medial T Great oe Performed By: Physician Fredirick Maudlin, MD Debridement Type: Debridement Severity of Tissue Pre Debridement: Fat layer exposed Level of Consciousness (Pre-procedure): Awake and Alert Pre-procedure Verification/Time Out Yes - 10:57 Taken: Start Time: 10:57 Pain Control: Lidocaine 4% T opical Solution T Area Debrided (L x W): otal 1 (cm) x 1 (cm) = 1 (cm) Tissue and other material debrided: Non-Viable, Slough, Subcutaneous, Tendon, Slough Level: Skin/Subcutaneous Tissue/Muscle Debridement Description: Excisional Instrument: Curette Bleeding: Minimum Hemostasis Achieved: Pressure Response to Treatment: Procedure was  tolerated well Level of Consciousness (Post- Awake and Alert procedure): Post Debridement Measurements of Total Wound Length: (cm) 1 Width: (cm) 1 Depth: (cm) 0.3 Volume: (cm) 0.236 Character of Wound/Ulcer Post Debridement: Improved Severity of Tissue Post Debridement: Fat layer exposed Post Procedure Diagnosis Same as Pre-procedure Notes scribed for Dr. Celine Ahr by Adline Peals, RN Electronic Signature(s) Signed: 11/09/2022 11:33:58 AM By: Fredirick Maudlin MD FACS Signed: 11/09/2022 3:35:42 PM By: Adline Peals Entered By: Adline Peals on 11/09/2022 10:59:12 [place], [place] ([id number] Page 2 of 9 -------------------------------------------------------------------------------- HPI Details Patient Name: Date of ServiceShannan [name] [name], [name] 11/09/2022 10:00 [place] Record Number: [id number] Patient Account Number: [id number] Date of Birth/Sex: Treating RN: 02-Jul-1951 (72 y.o. M) Primary Care Provider: [name] Other Clinician: Referring Provider: Treating Provider/Extender: [name] Weeks in Treatment: 4 History of Present Illness HPI Description: ADMISSION 10/12/2022 This is a 72 year old type II diabetic (last hemoglobin A1c 7.7 in October 2023) with stage V chronic kidney disease and hypertension. He has been followed by podiatry for an ulcer on his right great toe. He has a history of prior toe and foot infections requiring amputation. In November 2023, he had an outpatient MRI that demonstrated osteomyelitis. He was admitted to the hospital for IV antibiotics and subsequently underwent irrigation and debridement of the toe with biopsy and placement of married 3 layer matrix. Cultures grew out MRSA and osteomyelitis was seen on pathology. He was followed by infectious disease and given a course of doxycycline and Augmentin. He completed this course at the end of December. He has been evaluated by vascular  surgery with formal ABIs as well as consultation. He does have adequate blood flow at least to the dorsalis pedis. He last saw podiatry on January 4 of this year where he underwent debridement. It was felt he would benefit from specialized wound care and he was referred to our center for further evaluation and management. On the dorsal aspect of his right great toe, there is a circular ulcer. There is  a fair amount of undermining from callus and dry skin. The surface is a bit pale and fibrotic, but there is no obvious necrosis. 10/19/2022: He has been approved by his insurance to undergo hyperbaric oxygen therapy. EKG has been obtained and is normal. We are still waiting on his chest x-ray. The wound is clean with just a [name] bit of periwound callus and slough on the surface. Tendon is exposed. 10/26/2022: Unfortunately, the required co-pay from his insurance is cost prohibitive for him and he will not be able to receive hyperbaric oxygen therapy. Some moisture got under the dry skin around his wound and lifted it off but there is good epithelialized tissue beneath this. The exposed tendon has deteriorated. 11/03/2022: After some discussion with the financial office, it has become feasible for the patient to undergo hyperbaric oxygen therapy and he has received his first treatments this week. The wound is a [name] bit smaller and the tissue has a better color to it, but there is still extensive nonviable subcutaneous tissue and slough present. 11/09/2022: The wound measured a [name] bit smaller today. There is still slough and nonviable subcu tissue and tendon present. Electronic Signature(s) Signed: 11/09/2022 11:01:17 AM By: Fredirick Maudlin MD FACS Entered By: Fredirick Maudlin on 11/09/2022 11:01:16 -------------------------------------------------------------------------------- Physical Exam Details Patient Name: Date of Service: [facility] [name], [name] 11/09/2022 10:00 A M Medical Record Number:  [id number] Patient Account Number: [id number] Date of Birth/Sex: Treating RN: Apr 16, 1951 (72 y.o. M) Primary Care Provider: [name] Other Clinician: Referring Provider: Treating Provider/Extender: [name] Weeks in Treatment: 4 Constitutional Hypertensive, asymptomatic. . . . no acute distress. Respiratory Normal work of breathing on room air. Notes 11/09/2022: The wound measured a [name] bit smaller today. There is still slough and nonviable subcu tissue and tendon present. Electronic Signature(s) Signed: 11/09/2022 11:01:51 AM By: Fredirick Maudlin MD FACS Entered By: Fredirick Maudlin on 11/09/2022 11:01:51 -------------------------------------------------------------------------------- Physician Orders Details Patient Name: Date of Service: [facility] [name], [name] 11/09/2022 10:00 [name] Record Number: [id number] Patient Account Number: [id number] MICAN, COBY ([id number]) [id number] Page 3 of 9 Date of Birth/Sex: Treating RN: 1950/11/10 (72 y.o. [name] Primary Care Provider: Other Clinician: [name] Referring Provider: Treating Provider/Extender: [name] Weeks in Treatment: 4 Verbal / Phone Orders: No Diagnosis Coding ICD-10 Coding Code Description L97.514 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.621 Type 2 diabetes mellitus with foot ulcer N18.5 Chronic kidney disease, stage 5 M86.9 Osteomyelitis, unspecified E11.42 Type 2 diabetes mellitus with diabetic polyneuropathy I10 Essential (primary) hypertension Follow-up Appointments ppointment in 1 week. - Dr. Celine Ahr Rm 1 Return A Anesthetic Wound #1 Right,Medial T Great oe (In clinic) Topical Lidocaine 4% applied to wound bed Bathing/ Shower/ Hygiene May shower and wash wound with soap and water. Off-Loading Wound #1 Right,Medial T Great oe Other: - Diabetic shoe Wound Treatment Wound #1 - T  Great oe Wound Laterality: Right, Medial Cleanser: Soap and Water 1 x Per Day/30 Days Discharge Instructions: May shower and wash wound with dial antibacterial soap and water prior to dressing change. Cleanser: Wound Cleanser 1 x Per Day/30 Days Discharge Instructions: Cleanse the wound with wound cleanser prior to applying a clean dressing using gauze sponges, not tissue or cotton balls. Prim Dressing: Sorbalgon AG Dressing 2x2 (in/in) 1 x Per Day/30 Days ary Discharge Instructions: Apply to wound bed as instructed Secondary Dressing: Woven Gauze Sponge, Non-Sterile 4x4 in 1 x Per Day/30 Days Discharge  Instructions: Apply over primary dressing as directed. Secured With: Child psychotherapist, Sterile 2x75 (in/in) 1 x Per Day/30 Days Discharge Instructions: Secure with stretch gauze as directed. Secured With: 76M Medipore Public affairs consultant Surgical T 2x10 (in/yd) 1 x Per Day/30 Days ape Discharge Instructions: Secure with tape as directed. Patient Medications llergies: losartan potassium, sildenafil A Notifications Medication Indication Start End 11/09/2022 lidocaine DOSE topical 4 % cream - cream topical Electronic Signature(s) Signed: 11/09/2022 11:33:58 AM By: Fredirick Maudlin MD FACS Entered By: Fredirick Maudlin on 11/09/2022 11:04:28 -------------------------------------------------------------------------------- Problem List Details Patient Name: Date of Service: [name], [name] 11/09/2022 10:00 A [name] Heading, [name] ([id number]) [phone].pdf Page 4 of 9 Medical Record Number: [id number] Patient Account Number: [id number] Date of Birth/Sex: Treating RN: August 31, 1951 (72 y.o. M) Primary Care Provider: [name] Other Clinician: Referring Provider: Treating Provider/Extender: [name] Weeks in Treatment: 4 Active Problems ICD-10 Encounter Code Description Active Date MDM Diagnosis L97.514 Non-pressure chronic ulcer of other part  of right foot with necrosis of bone 10/12/2022 No Yes E11.621 Type 2 diabetes mellitus with foot ulcer 10/12/2022 No Yes N18.5 Chronic kidney disease, stage 5 10/12/2022 No Yes M86.9 Osteomyelitis, unspecified 10/12/2022 No Yes E11.42 Type 2 diabetes mellitus with diabetic polyneuropathy 10/12/2022 No Yes I10 Essential (primary) hypertension 10/12/2022 No Yes Inactive Problems Resolved Problems Electronic Signature(s) Signed: 11/09/2022 11:04:12 AM By: Fredirick Maudlin MD FACS Previous Signature: 11/09/2022 11:00:27 AM Version By: Fredirick Maudlin MD FACS Entered By: Fredirick Maudlin on 11/09/2022 11:04:12 -------------------------------------------------------------------------------- Progress Note Details Patient Name: Date of Service: [facility] [name], [name] 11/09/2022 10:00 [place] Record Number: [id number] Patient Account Number: [id number] Date of Birth/Sex: Treating RN: 1951/03/10 (72 y.o. M) Primary Care Provider: [name] Other Clinician: Referring Provider: Treating Provider/Extender: [name] Weeks in Treatment: 4 Subjective Chief Complaint Information obtained from Patient Patients presents for treatment of an open diabetic ulcer on the right great toe History of Present Illness (HPI) ADMISSION 10/12/2022 This is a 72 year old type II diabetic (last hemoglobin A1c 7.7 in October 2023) with stage V chronic kidney disease and hypertension. He has been followed by podiatry for an ulcer on his right great toe. He has a history of prior toe and foot infections requiring amputation. In November 2023, he had an outpatient MRI that demonstrated osteomyelitis. He was admitted to the hospital for IV antibiotics and subsequently underwent irrigation and debridement of the toe with biopsy and placement of married 3 layer matrix. Cultures grew out MRSA and osteomyelitis was seen on pathology. He was followed by infectious disease and given a course of doxycycline and Augmentin.  He completed this course at the end of December. He has been evaluated by vascular surgery with formal ABIs as well as consultation. He does have adequate blood flow at least to the dorsalis pedis. He last saw podiatry on January 4 of this year where he underwent debridement. It was felt he would benefit from specialized wound care and he was referred to our center for further evaluation and management. On the dorsal aspect of his right great toe, there is a circular ulcer. There is a fair amount of undermining from callus and dry skin. The surface is a bit pale and fibrotic, but there is no obvious necrosis. [place], [name] ([id number]) [id number] Page 5 of 9 10/19/2022: He has been approved by his insurance to undergo hyperbaric oxygen therapy. EKG has been obtained and is normal. We are still waiting on his chest x-ray. The wound is  clean with just a [name] bit of periwound callus and slough on the surface. Tendon is exposed. 10/26/2022: Unfortunately, the required co-pay from his insurance is cost prohibitive for him and he will not be able to receive hyperbaric oxygen therapy. Some moisture got under the dry skin around his wound and lifted it off but there is good epithelialized tissue beneath this. The exposed tendon has deteriorated. 11/03/2022: After some discussion with the financial office, it has become feasible for the patient to undergo hyperbaric oxygen therapy and he has received his first treatments this week. The wound is a [name] bit smaller and the tissue has a better color to it, but there is still extensive nonviable subcutaneous tissue and slough present. 11/09/2022: The wound measured a [name] bit smaller today. There is still slough and nonviable subcu tissue and tendon present. Patient History Information obtained from Patient. Family History Diabetes - Mother,Siblings, Heart Disease - Siblings, Hypertension - Siblings, Seizures - Father, Stroke -  Siblings, No family history of Cancer, Hereditary Spherocytosis, Kidney Disease, Lung Disease, Thyroid Problems, Tuberculosis. Social History Former smoker, Marital Status - Married, Alcohol Use - Rarely, Drug Use - No History, Caffeine Use - Daily. Medical History Eyes Patient has history of Cataracts - rt eye Ear/Nose/Mouth/Throat Denies history of Chronic sinus problems/congestion Hematologic/Lymphatic Denies history of Anemia, Hemophilia, Human Immunodeficiency Virus, Lymphedema, Sickle Cell Disease Respiratory Denies history of Aspiration, Asthma, Chronic Obstructive Pulmonary Disease (COPD), Pneumothorax, Tuberculosis Cardiovascular Patient has history of Hypertension Gastrointestinal Denies history of Cirrhosis , Colitis, Crohnoos, Hepatitis A, Hepatitis B, Hepatitis C Endocrine Patient has history of Type II Diabetes Genitourinary Denies history of End Stage Renal Disease Immunological Denies history of Lupus Erythematosus, Raynaudoos, Scleroderma Integumentary (Skin) Denies history of History of Burn Musculoskeletal Patient has history of Osteomyelitis - MRI confirmed (08/16/22) Denies history of Gout Neurologic Patient has history of Neuropathy Psychiatric Patient has history of Confinement Anxiety Denies history of Anorexia/bulimia Hospitalization/Surgery History - AV fistula place left upper arm 10/20/22. Medical A Surgical History Notes nd Genitourinary CKD IV Objective Constitutional Hypertensive, asymptomatic. no acute distress. Vitals Time Taken: 10:21 AM, Height: 72 in, Weight: 185 lbs, BMI: 25.1, Temperature: 97.6 F, Pulse: 88 bpm, Respiratory Rate: 20 breaths/min, Blood Pressure: 163/71 mmHg, Capillary Blood Glucose: 110 mg/dl. Respiratory Normal work of breathing on room air. General Notes: 11/09/2022: The wound measured a [name] bit smaller today. There is still slough and nonviable subcu tissue and tendon present. Integumentary (Hair, Skin) Wound  #1 status is Open. Original cause of wound was Pressure Injury. The date acquired was: 08/16/2022. The wound has been in treatment 4 weeks. The wound is located on the Right,Medial T Great. The wound measures 1cm length x 1cm width x 0.3cm depth; 0.785cm^2 area and 0.236cm^3 volume. There is oe bone, tendon, and Fat Layer (Subcutaneous Tissue) exposed. There is no tunneling or undermining noted. There is a medium amount of serosanguineous drainage noted. The wound margin is distinct with the outline attached to the wound base. There is medium (34-66%) pink granulation within the wound bed. There is a medium (34-66%) amount of necrotic tissue within the wound bed including Adherent Slough. The periwound skin appearance had no abnormalities Py[place], [place] ([id number]) [phone].pdf Page 6 of 9 noted for texture. The periwound skin appearance had no abnormalities noted for moisture. The periwound skin appearance had no abnormalities noted for color. Periwound temperature was noted as No Abnormality. Assessment Active Problems ICD-10 Non-pressure chronic ulcer of other part of right foot with necrosis of  bone Type 2 diabetes mellitus with foot ulcer Chronic kidney disease, stage 5 Osteomyelitis, unspecified Type 2 diabetes mellitus with diabetic polyneuropathy Essential (primary) hypertension Procedures Wound #1 Pre-procedure diagnosis of Wound #1 is a Diabetic Wound/Ulcer of the Lower Extremity located on the Right,Medial T Great .Severity of Tissue Pre oe Debridement is: Fat layer exposed. There was a Excisional Skin/Subcutaneous Tissue/Muscle Debridement with a total area of 1 sq cm performed by Fredirick Maudlin, MD. With the following instrument(s): Curette to remove Non-Viable tissue/material. Material removed includes Tendon, Subcutaneous Tissue, and Slough after achieving pain control using Lidocaine 4% T opical Solution. No specimens were taken. A time out was  conducted at 10:57, prior to the start of the procedure. A Minimum amount of bleeding was controlled with Pressure. The procedure was tolerated well. Post Debridement Measurements: 1cm length x 1cm width x 0.3cm depth; 0.236cm^3 volume. Character of Wound/Ulcer Post Debridement is improved. Severity of Tissue Post Debridement is: Fat layer exposed. Post procedure Diagnosis Wound #1: Same as Pre-Procedure General Notes: scribed for Dr. Celine Ahr by Adline Peals, RN. Plan Follow-up Appointments: Return Appointment in 1 week. - Dr. Celine Ahr Rm 1 Anesthetic: Wound #1 Right,Medial T Great: oe (In clinic) Topical Lidocaine 4% applied to wound bed Bathing/ Shower/ Hygiene: May shower and wash wound with soap and water. Off-Loading: Wound #1 Right,Medial T Great: oe Other: - Diabetic shoe The following medication(s) was prescribed: lidocaine topical 4 % cream cream topical was prescribed at facility WOUND #1: - T Great Wound Laterality: Right, Medial oe Cleanser: Soap and Water 1 x Per Day/30 Days Discharge Instructions: May shower and wash wound with dial antibacterial soap and water prior to dressing change. Cleanser: Wound Cleanser 1 x Per Day/30 Days Discharge Instructions: Cleanse the wound with wound cleanser prior to applying a clean dressing using gauze sponges, not tissue or cotton balls. Prim Dressing: Sorbalgon AG Dressing 2x2 (in/in) 1 x Per Day/30 Days ary Discharge Instructions: Apply to wound bed as instructed Secondary Dressing: Woven Gauze Sponge, Non-Sterile 4x4 in 1 x Per Day/30 Days Discharge Instructions: Apply over primary dressing as directed. Secured With: Child psychotherapist, Sterile 2x75 (in/in) 1 x Per Day/30 Days Discharge Instructions: Secure with stretch gauze as directed. Secured With: 82M Medipore Public affairs consultant Surgical T 2x10 (in/yd) 1 x Per Day/30 Days ape Discharge Instructions: Secure with tape as directed. 11/09/2022: The wound measured a [name]  bit smaller today. There is still slough and nonviable subcu tissue and tendon present. I used a curette to debride slough, nonviable subcutaneous tissue, and necrotic tendon from his wound. We will continue to use silver alginate and he will continue his hyperbaric oxygen therapy. Follow-up with me in 1 week. Electronic Signature(s) Signed: 11/19/2022 2:19:36 PM By: Deon Pilling RN, BSN Signed: 11/27/2022 5:14:33 PM By: Fredirick Maudlin MD FACS Previous Signature: 11/09/2022 11:04:38 AM Version By: Fredirick Maudlin MD FACS Previous Signature: 11/09/2022 11:03:22 AM Version By: Fredirick Maudlin MD FACS Entered By: Deon Pilling on 11/19/2022 14:07:11 [place], Jimmey ([id number] Page 7 of 9 -------------------------------------------------------------------------------- HxROS Details Patient Name: Date of ServiceShannan [name] [name], [name] 11/09/2022 10:00 [place] Record Number: [id number] Patient Account Number: [id number] Date of Birth/Sex: Treating RN: 04-28-1951 (72 y.o. M) Primary Care Provider: [name] Other Clinician: Referring Provider: Treating Provider/Extender: [name] Weeks in Treatment: 4 Information Obtained From Patient Eyes Medical History: Positive for: Cataracts - rt eye Ear/Nose/Mouth/Throat Medical History: Negative for: Chronic sinus problems/congestion Hematologic/Lymphatic Medical History: Negative for: Anemia; Hemophilia;  Human Immunodeficiency Virus; Lymphedema; Sickle Cell Disease Respiratory Medical History: Negative for: Aspiration; Asthma; Chronic Obstructive Pulmonary Disease (COPD); Pneumothorax; Tuberculosis Cardiovascular Medical History: Positive for: Hypertension Gastrointestinal Medical History: Negative for: Cirrhosis ; Colitis; Crohns; Hepatitis A; Hepatitis B; Hepatitis C Endocrine Medical History: Positive for: Type II Diabetes Time with diabetes: 2000 Treated with: Insulin Blood  sugar tested every day: Yes Tested : Genitourinary Medical History: Negative for: End Stage Renal Disease Past Medical History Notes: CKD IV Immunological Medical History: Negative for: Lupus Erythematosus; Raynauds; Scleroderma Integumentary (Skin) Medical History: Negative for: History of Burn Musculoskeletal Medical History: Positive for: Osteomyelitis - MRI confirmed (08/16/22) Negative for: Gout Neurologic [name], [name] ([id number] Page 8 of 9 Medical History: Positive for: Neuropathy Psychiatric Medical History: Positive for: Confinement Anxiety Negative for: Anorexia/bulimia HBO Extended History Items Eyes: Cataracts Immunizations Pneumococcal Vaccine: Received Pneumococcal Vaccination: Yes Received Pneumococcal Vaccination On or After 60th Birthday: Yes Implantable Devices None Hospitalization / Surgery History Type of Hospitalization/Surgery AV fistula place left upper arm 10/20/22 Family and Social History Cancer: No; Diabetes: Yes - Mother,Siblings; Heart Disease: Yes - Siblings; Hereditary Spherocytosis: No; Hypertension: Yes - Siblings; Kidney Disease: No; Lung Disease: No; Seizures: Yes - Father; Stroke: Yes - Siblings; Thyroid Problems: No; Tuberculosis: No; Former smoker; Marital Status - Married; Alcohol Use: Rarely; Drug Use: No History; Caffeine Use: Daily; Financial Concerns: No; Food, Clothing or Shelter Needs: No; Support System Lacking: No; Transportation Concerns: No Electronic Signature(s) Signed: 11/09/2022 11:33:58 AM By: Fredirick Maudlin MD FACS Entered By: Fredirick Maudlin on 11/09/2022 11:01:26 -------------------------------------------------------------------------------- SuperBill Details Patient Name: Date of Service: [facility], [name] 11/09/2022 Medical Record Number: [id number] Patient Account Number: [id number] Date of Birth/Sex: Treating RN: [birth year] (72 y.o. M) Primary Care Provider: [name] Other Clinician: Referring Provider: Treating Provider/Extender: [name] Weeks in Treatment: 4 Diagnosis Coding ICD-10 Codes Code Description [phone] 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.621 Type 2 diabetes mellitus with foot ulcer N18.5 Chronic kidney disease, stage 5 M86.9 Osteomyelitis, unspecified E11.42 Type 2 diabetes mellitus with diabetic polyneuropathy I10 Essential (primary) hypertension Facility Procedures : CPT4 Code: [id number] Description: [id number] - DEB MUSC/FASCIA 20 SQ CM/< ICD-10 Diagnosis Description L97.514 Non-pressure chronic ulcer of other 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 Modifier: Quantity: 1 Physician Procedures : CPT4 Code Description Modifier [id number] - WC PHYS LEVEL 3 - EST PT 25 ICD-10 Diagnosis Description Sp[place], [name] ([id number]) [phone] L97.514 Non-pressure chronic ulcer of other 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  E11.621 Type 2 diabetes mellitus with foot ulcer M86.9 Osteomyelitis, unspecified N18.5 Chronic kidney disease, stage 5 Quantity: 1 72.pdf Page 9 of 9 : [id number] - WC PHYS DEBR MUSCLE/FASCIA 20 SQ CM 1 ICD-10 Diagnosis Description L97.514 Non-pressure chronic ulcer of other 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 Quantity: Electronic Signature(s) Signed: 11/09/2022 11:04:57 AM By: Fredirick Maudlin MD FACS Previous Signature: 11/09/2022 11:03:41 AM Version By: Fredirick Maudlin MD FACS Entered By: Fredirick Maudlin on 11/09/2022 11:04:57

## 2022-11-09 NOTE — Progress Notes (Signed)
[name], Vahe ([id number]) [id number] Page 1 of 7 Visit Report for 11/09/2022 Arrival Information Details Patient Name: Date of Service: [place], [name] 11/09/2022 10:00 [place] Record Number: [id number] Patient Account Number: [id number] Date of Birth/Sex: Treating RN: 01-01-1951 (72 y.o. M) Primary Care Crawford Tamura: [name] Other Clinician: Referring Jermeka Schlotterbeck: Treating Charlsey Moragne/Extender: [name] Weeks in Treatment: 4 Visit Information History Since Last Visit All ordered tests and consults were completed: No Patient Arrived: [name] Added or deleted any medications: No Arrival Time: 10:20 Any new allergies or adverse reactions: No Accompanied By: self Had a fall or experienced change in No Transfer Assistance: None activities of daily living that may affect Patient Identification Verified: Yes risk of falls: Secondary Verification Process Completed: Yes Signs or symptoms of abuse/neglect since last visito No Patient Requires Transmission-Based Precautions: No Hospitalized since last visit: No Patient Has Alerts: No Implantable device outside of the clinic excluding No cellular tissue based products placed in the center since last visit: Pain Present Now: No Electronic Signature(s) Signed: 11/09/2022 11:27:16 AM By: Worthy Rancher Entered By: Worthy Rancher on 11/09/2022 10:21:02 -------------------------------------------------------------------------------- Encounter Discharge Information Details Patient Name: Date of Service: [facility] [name], [name] 11/09/2022 10:00 [place] Record Number: [id number] Patient Account Number: [id number] Date of Birth/Sex: Treating RN: [birth year] (72 y.o. [name] Primary Care Clancy Leiner: [name] Other Clinician: Referring Sender Rueb: Treating Bianka Liberati/Extender: [name] Weeks in Treatment: 4 Encounter Discharge Information Items Post Procedure Vitals Discharge Condition:  Stable Temperature (F): 97.6 Ambulatory Status: Cane Pulse (bpm): 88 Discharge Destination: Home Respiratory Rate (breaths/min): 20 Transportation: Private Auto Blood Pressure (mmHg): 163/71 Accompanied By: self Schedule Follow-up Appointment: Yes Clinical Summary of Care: Patient Declined Electronic Signature(s) Signed: 11/09/2022 3:35:42 PM By: Adline Peals Entered By: Adline Peals on 11/09/2022 11:07:23 Simar, [name] ([id number]) [id number] Page 2 of 7 -------------------------------------------------------------------------------- Lower Extremity Assessment Details Patient Name: Date of ServiceJodene [name], [name] 11/09/2022 10:00 [place] Record Number: [id number] Patient Account Number: [id number] Date of Birth/Sex: Treating RN: 1950/12/16 (72 y.o. [name] Primary Care Javaughn Opdahl: [name] Other Clinician: Referring Gerrett Loman: Treating Basil Blakesley/Extender: [name] Weeks in Treatment: 4 Edema Assessment Assessed: [Left: No] [Right: No] Edema: [Left: N] [Right: o] Calf Left: Right: Point of Measurement: From Medial Instep 37.5 cm Ankle Left: Right: Point of Measurement: From Medial Instep 23.5 cm Electronic Signature(s) Signed: 11/09/2022 3:35:42 PM By: Adline Peals Entered By: Adline Peals on 11/09/2022 10:50:47 -------------------------------------------------------------------------------- Multi Wound Chart Details Patient Name: Date of Service: [facility] [name], [name] 11/09/2022 10:00 A M Medical Record Number: [id number] Patient Account Number: [id number] Date of Birth/Sex: Treating RN: [birth year] (72 y.o. M) Primary Care Dimonique Bourdeau: [name] Other Clinician: Referring Jquan Egelston: Treating Elvenia Godden/Extender: [name] Weeks in Treatment: 4 Vital Signs Height(in): 72 Capillary Blood Glucose(mg/dl): 110 Weight(lbs): 185 Pulse(bpm): 88 Body Mass Index(BMI): 25.1 Blood  Pressure(mmHg): 163/71 Temperature(F): 97.6 Respiratory Rate(breaths/min): 20 [1:Photos:] [N/A:N/A] Right, Medial T Great oe N/A N/A Wound Location: Pressure Injury N/A N/A Wounding Event: Diabetic Wound/Ulcer of the Lower N/A N/A Primary Etiology: Extremity Acute Osteomyelitis N/A N/A Secondary Etiology: Cataracts, Hypertension, Type II N/A N/A Comorbid HistoryAntavion Bartoszek, [name] ([id number]) [id number] Page 3 of 7 Diabetes, Osteomyelitis, Neuropathy, Confinement Anxiety 08/16/2022 N/A N/A Date Acquired: 4 N/A N/A Weeks of Treatment: Open N/A N/A Wound Status: No N/A N/A Wound Recurrence: 1x1x0.3 N/A N/A Measurements L x W x D (cm) 0.785 N/A N/A A (cm) : rea 0.236 N/A N/A Volume (  cm) : -185.50% N/A N/A % Reduction in A rea: -114.50% N/A N/A % Reduction in Volume: Grade 3 N/A N/A Classification: Medium N/A N/A Exudate A mount: Serosanguineous N/A N/A Exudate Type: red, brown N/A N/A Exudate Color: Distinct, outline attached N/A N/A Wound Margin: Medium (34-66%) N/A N/A Granulation A mount: Pink N/A N/A Granulation Quality: Medium (34-66%) N/A N/A Necrotic A mount: Fat Layer (Subcutaneous Tissue): Yes N/A N/A Exposed Structures: Tendon: Yes Fascia: No Muscle: No Joint: No Bone: No Small (1-33%) N/A N/A Epithelialization: Debridement - Excisional N/A N/A Debridement: Pre-procedure Verification/Time Out 10:57 N/A N/A Taken: Lidocaine 4% Topical Solution N/A N/A Pain Control: Tendon, Subcutaneous, Slough N/A N/A Tissue Debrided: Skin/Subcutaneous Tissue/Muscle N/A N/A Level: 1 N/A N/A Debridement A (sq cm): rea Curette N/A N/A Instrument: Minimum N/A N/A Bleeding: Pressure N/A N/A Hemostasis A chieved: Procedure was tolerated well N/A N/A Debridement Treatment Response: 1x1x0.3 N/A N/A Post Debridement Measurements L x W x D (cm) 0.236 N/A N/A Post Debridement Volume: (cm) Callus: Yes N/A N/A Periwound  Skin Texture: Maceration: Yes N/A N/A Periwound Skin Moisture: No Abnormalities Noted N/A N/A Periwound Skin Color: No Abnormality N/A N/A Temperature: Debridement N/A N/A Procedures Performed: Treatment Notes Electronic Signature(s) Signed: 11/09/2022 11:00:37 AM By: Fredirick Maudlin MD FACS Entered By: Fredirick Maudlin on 11/09/2022 11:00:36 -------------------------------------------------------------------------------- Multi-Disciplinary Care Plan Details Patient Name: Date of Service: [facility] [place], [name] 11/09/2022 10:00 A M Medical Record Number: [id number] Patient Account Number: [id number] Date of Birth/Sex: Treating RN: 10-31-1950 (72 y.o. [name] Primary Care Maxine Huynh: [name] Other Clinician: Referring Laritza Vokes: Treating Zaidan Keeble/Extender: [name] Weeks in Treatment: [place] reviewed with physician Active Inactive Nutrition Nursing Diagnoses: Impaired glucose control: actual or potential Mallon, Divit ([id number]) [id number] Page 4 of 7 Goals: Patient/caregiver will maintain therapeutic glucose control Date Initiated: 10/12/2022 Target Resolution Date: 12/28/2022 Goal Status: Active Interventions: Provide education on elevated blood sugars and impact on wound healing Provide education on nutrition Treatment Activities: Dietary management education, guidance and counseling : 10/12/2022 Giving encouragement to exercise : 10/12/2022 Notes: Osteomyelitis Nursing Diagnoses: Infection: osteomyelitis Goals: Patient/caregiver will verbalize understanding of disease process and disease management Date Initiated: 10/12/2022 Target Resolution Date: 11/29/2022 Goal Status: Active Interventions: Assess for signs and symptoms of osteomyelitis resolution every visit Provide education on osteomyelitis Treatment Activities: MRI : 08/16/2022 Notes: Wound/Skin Impairment Nursing  Diagnoses: Impaired tissue integrity Goals: Ulcer/skin breakdown will have a volume reduction of 30% by week 4 Date Initiated: 10/12/2022 Target Resolution Date: 11/30/2022 Goal Status: Active Interventions: Assess ulceration(s) every visit Provide education on smoking Treatment Activities: Skin care regimen initiated : 10/12/2022 Smoking cessation education : 10/12/2022 Notes: Electronic Signature(s) Signed: 11/09/2022 3:35:42 PM By: Adline Peals Entered By: Adline Peals on 11/09/2022 10:58:39 -------------------------------------------------------------------------------- Pain Assessment Details Patient Name: Date of Service: [facility] [name], [name] 11/09/2022 10:00 [place] Record Number: [id number] Patient Account Number: [id number] Date of Birth/Sex: Treating RN: 1951-07-01 (72 y.o. M) Primary Care Veera Stapleton: [name] Other Clinician: Referring Yerlin Gasparyan: Treating Jaeanna Mccomber/Extender: [name] Weeks in Treatment: 4 Active Problems [place], [place] ([id number]) [id number] Page 5 of 7 Location of Pain Severity and Description of Pain Patient Has Paino No Site Locations Pain Management and Medication Current Pain Management: Electronic Signature(s) Signed: 11/09/2022 11:27:16 AM By: Worthy Rancher Entered By: Worthy Rancher on 11/09/2022 10:21:32 -------------------------------------------------------------------------------- Patient/Caregiver Education Details Patient Name: Date of Service: [name], [name] 2/8/2024andnbsp10:00 [place] Record Number: [id number] Patient Account Number: [id number] Date of Birth/Gender:  Treating RN: 03-Jan-1951 (72 y.o. [name] Primary Care Physician: [name] Other Clinician: Referring Physician: Treating Physician/Extender: Levin Erp in Treatment: 4 Education Assessment Education Provided To: Patient Education Topics Provided Wound/Skin  Impairment: Methods: Explain/Verbal Responses: Reinforcements needed, State content correctly Electronic Signature(s) Signed: 11/09/2022 3:35:42 PM By: Adline Peals Entered By: Adline Peals on 11/09/2022 10:59:03 -------------------------------------------------------------------------------- Wound Assessment Details Patient Name: Date of Service: [name], [name] 11/09/2022 10:00 A COHL BEHRENS, [name] ([id number]) [id number] Page 6 of 7 Medical Record Number: [id number] Patient Account Number: [id number] Date of Birth/Sex: Treating RN: [birth year] (72 y.o. M) Primary Care Brooke Payes: [name] Other Clinician: Referring Chikita Dogan: Treating Mikalah Skyles/Extender: [name] Weeks in Treatment: 4 Wound Status Wound Number: 1 Primary Diabetic Wound/Ulcer of the Lower Extremity Etiology: Wound Location: Right, Medial T Great oe Secondary Acute Osteomyelitis Wounding Event: Pressure Injury Etiology: Date Acquired: 08/16/2022 Wound Open Weeks Of Treatment: 4 Status: Clustered Wound: No Comorbid Cataracts, Hypertension, Type II Diabetes, Osteomyelitis, History: Neuropathy, Confinement Anxiety Photos Wound Measurements Length: (cm) 1 Width: (cm) 1 Depth: (cm) 0.3 Area: (cm) 0.785 Volume: (cm) 0.236 % Reduction in Area: -185.5% % Reduction in Volume: -114.5% Epithelialization: Small (1-33%) Tunneling: No Undermining: No Wound Description Classification: Grade 3 Wound Margin: Distinct, outline attached Exudate Amount: Medium Exudate Type: Serosanguineous Exudate Color: red, brown Foul Odor After Cleansing: No Slough/Fibrino Yes Wound Bed Granulation Amount: Medium (34-66%) Exposed Structure Granulation Quality: Pink Fascia Exposed: No Necrotic Amount: Medium (34-66%) Fat Layer (Subcutaneous Tissue) Exposed: Yes Necrotic Quality: Adherent Slough Tendon Exposed: Yes Muscle Exposed: No Joint Exposed: No Bone Exposed:  Yes Periwound Skin Texture Texture Color No Abnormalities Noted: Yes No Abnormalities Noted: Yes Moisture Temperature / Pain No Abnormalities Noted: Yes Temperature: No Abnormality Treatment Notes Wound #1 (Toe Great) Wound Laterality: Right, Medial Cleanser Soap and Water Discharge Instruction: May shower and wash wound with dial antibacterial soap and water prior to dressing change. Wound Cleanser Discharge Instruction: Cleanse the wound with wound cleanser prior to applying a clean dressing using gauze sponges, not tissue or cotton balls. Peri-Wound Care Topical Primary Dressing GARET, HOOTON ([id number]) [id number] Page 7 of 7 [employer] Dressing 2x2 (in/in) Discharge Instruction: Apply to wound bed as instructed Secondary Dressing Woven Gauze Sponge, Non-Sterile 4x4 in Discharge Instruction: Apply over primary dressing as directed. Secured With Conforming Stretch Gauze Bandage, Sterile 2x75 (in/in) Discharge Instruction: Secure with stretch gauze as directed. 13M Medipore Soft Cloth Surgical T 2x10 (in/yd) ape Discharge Instruction: Secure with tape as directed. Compression Wrap Compression Stockings Add-Ons Electronic Signature(s) Signed: 11/09/2022 3:35:42 PM By: Adline Peals Entered By: Adline Peals on 11/09/2022 11:06:32 -------------------------------------------------------------------------------- Vitals Details Patient Name: Date of Service: [facility] [name], [name] 11/09/2022 10:00 A M Medical Record Number: [id number] Patient Account Number: [id number] Date of Birth/Sex: Treating RN: [birth year] (72 y.o. M) Primary Care Khyre Germond: [name] Other Clinician: Referring Azka Steger: Treating Dilraj Killgore/Extender: [name] Weeks in Treatment: 4 Vital Signs Time Taken: 10:21 Temperature (F): 97.6 Height (in): 72 Pulse (bpm): 88 Weight (lbs): 185 Respiratory Rate (breaths/min): 20 Body Mass Index (BMI):  25.1 Blood Pressure (mmHg): 163/71 Capillary Blood Glucose (mg/dl): 110 Reference Range: 80 - 120 mg / dl Electronic Signature(s) Signed: 11/09/2022 11:27:16 AM By: Worthy Rancher Entered By: Worthy Rancher on 11/09/2022 10:21:26

## 2022-11-09 NOTE — Progress Notes (Signed)
[name], [name] ([id number]) [id number] Page 1 of 2 Visit Report for 11/09/2022 HBO Details Patient Name: Date of Service: [facility] [name], [name] 11/09/2022 12:00 PM Medical Record Number: [id number] Patient Account Number: [id number] Date of Birth/Sex: Treating RN: March 12, 1951 (72 y.o. 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 HBO Treatment Details Treatment Number: 8 Patient Type: Outpatient Chamber Type: Monoplace Chamber Serial #: [id number] Treatment Protocol: 2.0 ATA with 90 minutes oxygen, and no air breaks Treatment Details Compression Rate Down: 2.0 psi / minute De-Compression Rate Up: 2.0 psi / minute Air breaks and breathing Decompress Decompress Compress Tx Pressure Begins Reached periods Begins Ends (leave unused spaces blank) Chamber Pressure (ATA 1 2 ------2 1 ) Clock Time (24 hr) 12:38 12:48 - - - - - - 14:18 14:30 Treatment Length: 112 (minutes) Treatment Segments: 4 Vital Signs Capillary Blood Glucose Reference Range: 80 - 120 mg / dl HBO Diabetic Blood Glucose Intervention Range: <131 mg/dl or >249 mg/dl Time Vitals Blood Respiratory Capillary Blood Glucose Pulse Action Type: Pulse: Temperature: Taken: Pressure: Rate: Glucose (mg/dl): Meter #: Oximetry (%) Taken: Pre 11:44 129 Post 14:34 150/61 83 16 97.7 298 Pre 12:14 151 Pre 12:33 153/61 91 18 98 Treatment Response Treatment Toleration: Well Treatment Completion Status: Treatment Completed without Adverse Event Treatment Notes Blood sugar checked at 1144. The patient stated that he had just ate lunch at 1130.  Cheeseburger fries with orange juice. Spoke with Dr. Celine [name] rechecked blood sugar. At 1214 blood sugar was 151. Treatment started at 1238. Discharge blood sugar was 298. Physician HBO Attestation: I certify that I supervised this HBO treatment in accordance with Medicare guidelines. A trained emergency response team is readily available per Yes hospital policies and procedures. Continue HBOT as ordered. Yes Electronic Signature(s) Signed: 11/09/2022 3:15:39 PM By: [name] Maudlin MD FACS Previous Signature: 11/09/2022 2:46:46 PM Version By: [name] EMT Previous Signature: 11/09/2022 2:20:40 PM Version By: [name] EMT [name], [name] ([id number]) [id number] Page 2 of 2 Previous Signature: 11/09/2022 2:20:40 PM Version By: [name] EMT Entered By: [name] on 11/09/2022 15:15:39 -------------------------------------------------------------------------------- HBO Safety Checklist Details Patient Name: Date of Service: [facility] [name], [name] 11/09/2022 12:00 PM Medical Record Number: [id number] Patient Account Number: [id number] Date of Birth/Sex: Treating RN: December 10, 1950 (72 y.o. [name], [name] Primary Care [name]: [name] Other Clinician: Valeria [name] Referring [name]: Treating [name]/Extender: [name] Weeks in Treatment: 4 HBO Safety Checklist Items Safety Checklist Consent Form Signed Patient voided / foley secured and emptied When did you last eato 1130 Last dose of injectable or oral agent Last night Ostomy pouch emptied and vented if applicable NA All implantable devices assessed, documented and approved Libre2 Dialysis shunt Intravenous access site secured and place NA Valuables secured Linens and cotton and cotton/polyester blend (less than 51% polyester) Personal oil-based products / skin lotions / body lotions removed Wigs or hairpieces removed Smoking or tobacco materials removed Books / newspapers /  magazines / loose paper removed Cologne, aftershave, perfume and deodorant removed Jewelry removed (may wrap wedding band) Make-up removed NA Hair care products removed Battery operated devices (external) removed Heating patches and chemical warmers removed Titanium eyewear removed NA Nail polish cured greater than 10 hours NA  Casting material cured greater than 10 hours NA Hearing aids removed NA Loose dentures or partials removed removed by patient Prosthetics have been removed NA Patient demonstrates correct use of air break device (if applicable) Patient concerns have been addressed Patient grounding bracelet on and cord attached to chamber Specifics for Inpatients (complete in addition to above) Medication sheet sent with patient NA Intravenous medications needed or due during therapy sent with patient NA Drainage tubes (e.g. nasogastric tube or chest tube secured and vented) NA Endotracheal or Tracheotomy tube secured NA Cuff deflated of air and inflated with saline NA Airway suctioned NA Notes The safety checklist was done before the treatment was started. Electronic Signature(s) Signed: 11/09/2022 2:19:20 PM By: [name] EMT Entered By: [name] on 11/09/2022 14:19:20

## 2022-11-09 NOTE — Progress Notes (Signed)
[name], [name] ([id number]) [id number] Page 1 of 2 Visit Report for 11/09/2022 Arrival Information Details Patient Name: Date of Service: [name], [name] 11/09/2022 12:00 PM Medical Record Number: [id number] Patient Account Number: [id number] Date of Birth/Sex: Treating RN: Nov 02, 1950 (72 y.o. [name], [name] Primary Care [name]: [name] Other Clinician: Valeria [name] Referring [name]: Treating [name]/Extender: [name] in Treatment: 4 Visit Information History Since Last Visit All ordered tests and consults were completed: Yes Patient Arrived: Cane Added or deleted any medications: No Arrival Time: 11:30 Any new allergies or adverse reactions: No Accompanied By: None Had a fall or experienced change in No Transfer Assistance: None activities of daily living that may affect Patient Identification Verified: Yes risk of falls: Secondary Verification Process Completed: Yes Signs or symptoms of abuse/neglect since last visito No Patient Requires Transmission-Based Precautions: No Hospitalized since last visit: No Patient Has Alerts: No Implantable device outside of the clinic excluding No cellular tissue based products placed in the center since last visit: Pain Present Now: No Electronic Signature(s) Signed: 11/09/2022 2:17:24 PM By: [name] EMT Entered By: [name] on 11/09/2022 14:17:24 -------------------------------------------------------------------------------- Encounter Discharge Information Details Patient Name: Date of Service: [name] 11/09/2022 12:00 PM Medical Record Number: [id number] Patient Account Number: [id number] Date of Birth/Sex: Treating RN: 1951-07-13 (72 y.o. [name] Primary Care Cobin [name]: [name] Other Clinician: Valeria [name] Referring Arelene Moroni: Treating [name]/Extender: [name] in Treatment: 4 Encounter Discharge Information  Items Discharge Condition: Stable Ambulatory Status: Cane Discharge Destination: Home Transportation: Private Auto Accompanied By: None Schedule Follow-up Appointment: Yes Clinical Summary of Care: Electronic Signature(s) Signed: 11/09/2022 2:48:17 PM By: [name] EMT Entered By: [name] on 11/09/2022 14:48:17 [name] ([id number]) [id number] Page 2 of 2 -------------------------------------------------------------------------------- Vitals Details Patient Name: Date of Service: [name] 11/09/2022 12:00 PM Medical Record Number: [id number] Patient Account Number: [id number] Date of Birth/Sex: Treating RN: 07-20-1951 (72 y.o. [name] Primary Care [name]: [name] Other Clinician: Valeria [name] Referring Christiane Sistare: Treating [name]/Extender: [name] in Treatment: 4 Vital Signs Time Taken: 11:44 Capillary Blood Glucose (mg/dl): 129 Height (in): 72 Reference Range: 80 - 120 mg / dl Weight (lbs): 185 Body Mass Index (BMI): 25.1 Electronic Signature(s) Signed: 11/09/2022 2:17:47 PM By: [name] EMT Entered By: [name] on 11/09/2022 14:17:47

## 2022-11-09 NOTE — Progress Notes (Signed)
Morrison, Christobal ([id number]) [id number] Page 1 of 2 Visit Report for 11/08/2022 Problem List Details Patient Name: Date of Service: [name], [name] 11/08/2022 12:00 PM Medical Record Number: [id number] Patient Account Number: [id number] Date of Birth/Sex: Treating RN: [birth year] (72 y.o. 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/12/2022 No Yes layer exposed E11.621 Type 2 diabetes mellitus with foot ulcer 10/12/2022 No Yes N18.5 Chronic kidney disease, stage 5 10/12/2022 No Yes M86.9 Osteomyelitis, unspecified 10/12/2022 No Yes E11.42 Type 2 diabetes mellitus with diabetic polyneuropathy 10/12/2022 No Yes I10 Essential (primary) hypertension 10/12/2022 No Yes Inactive Problems Resolved Problems Electronic Signature(s) Signed: 11/08/2022 2:32:05 PM By: Valeria Batman EMT Signed: 11/08/2022 5:26:12 PM By: Fredirick Maudlin MD FACS Entered By: Valeria Batman on 11/08/2022 14:32:04 -------------------------------------------------------------------------------- SuperBill Details Patient Name: Date of Service: [name], [name] 11/08/2022 Medical Record Number: [id number] Patient Account Number: [id number] Date of Birth/Sex: Treating RN: March 29, 1951 (72 y.o. [name] Primary Care Provider: [name] Other Clinician: Valeria Batman Referring Provider: Treating Provider/Extender: [name] Weeks in Treatment: 3 [place], [name] ([id number]) [id number] Page 2 of 2 Diagnosis Coding ICD-10 Codes Code Description [phone] Non-pressure chronic ulcer of other part of right foot with fat layer exposed E11.621 Type 2 diabetes mellitus  with foot ulcer N18.5 Chronic kidney disease, stage 5 M86.9 Osteomyelitis, unspecified E11.42 Type 2 diabetes mellitus with diabetic polyneuropathy I10 Essential (primary) hypertension Facility Procedures : CPT4 Code Description: [id number] G0277-(Facility Use Only) HBOT full body chamber, 32mn , ICD-10 Diagnosis Description M86.9 Osteomyelitis, unspecified E11.621 Type 2 diabetes mellitus with foot ulcer L97.512 Non-pressure chronic ulcer of other part of  right foot wit Modifier: h fat layer Quantity: 4 exposed Physician Procedures : CPT4 Code Description Modifier [id number] PHYS HYPERBARIC OXYGEN THERAPY ICD-10 Diagnosis Description M86.9 Osteomyelitis, unspecified E11.621 Type 2 diabetes mellitus with foot ulcer L97.512 Non-pressure chronic ulcer of other part of right  foot with fat layer Quantity: 1 exposed Electronic Signature(s) Signed: 11/08/2022 2:32:00 PM By: GValeria BatmanEMT Signed: 11/08/2022 5:26:12 PM By: CFredirick MaudlinMD FACS Entered By: GValeria Batmanon 11/08/2022 14:32:00

## 2022-11-09 NOTE — Progress Notes (Signed)
[place], [name] ([id number]) [id number] Page 1 of 2 Visit Report for 11/08/2022 HBO Details Patient Name: Date of Service: [name], [name] 11/08/2022 12:00 PM Medical Record Number: [id number] Patient Account Number: [id number] Date of Birth/Sex: Treating RN: 12-12-1950 (72 y.o. 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 HBO Treatment Details Treatment Number: 7 Patient Type: Outpatient Chamber Type: Monoplace Chamber Serial #: [id number] Treatment Protocol: 2.0 ATA with 90 minutes oxygen, and no air breaks Treatment Details Compression Rate Down: 2.0 psi / minute De-Compression Rate Up: 2.0 psi / minute Air breaks and breathing Decompress Decompress Compress Tx Pressure Begins Reached periods Begins Ends (leave unused spaces blank) Chamber Pressure (ATA 1 2 ------2 1 ) Clock Time (24 hr) 12:17 12:30 - - - - - - 14:00 14:07 Treatment Length: 110 (minutes) Treatment Segments: 4 Vital Signs Capillary Blood Glucose Reference Range: 80 - 120 mg / dl HBO Diabetic Blood Glucose Intervention Range: <131 mg/dl or >249 mg/dl Time Vitals Blood Respiratory Capillary Blood Glucose Pulse Action Type: Pulse: Temperature: Taken: Pressure: Rate: Glucose (mg/dl): Meter #: Oximetry (%) Taken: Pre 11:25 212 Post 14:11 143/63 79 18 98.2 127 Pre 12:02 151/62 86 18 98.2 Treatment Response Treatment Toleration: Well Treatment Completion Status: Treatment Completed without Adverse Event [name] Notes No concerns with treatment given Physician HBO Attestation: I certify that I supervised this HBO  treatment in accordance with Medicare guidelines. A trained emergency response team is readily available per Yes hospital policies and procedures. Continue HBOT as ordered. Yes Electronic Signature(s) Signed: 11/08/2022 5:28:19 PM By: [name] Maudlin MD FACS Previous Signature: 11/08/2022 3:38:34 PM Version By: [name] Ham MD Previous Signature: 11/08/2022 2:31:19 PM Version By: [name] EMT Entered By: [name] on 11/08/2022 17:28:19 [name], [name] ([id number]) [id number] Page 2 of 2 -------------------------------------------------------------------------------- HBO Safety Checklist Details Patient Name: Date of ServiceJodene [name], [name] 11/08/2022 12:00 PM Medical Record Number: [id number] Patient Account Number: [id number] Date of Birth/Sex: Treating RN: 1950-12-14 (72 y.o. [name] Session Primary Care [name]: [name] Other Clinician: Valeria [name] Referring [name]: Treating [name]/Extender: [name] Weeks in Treatment: 3 HBO Safety Checklist Items Safety Checklist Consent Form Signed Patient voided / foley secured and emptied When did you last eato 1000 Last dose of injectable or oral agent 1000 Ostomy pouch emptied and vented if applicable NA All implantable devices assessed, documented and approved Dialysis shunt Intravenous access site secured and place NA Valuables secured Linens and cotton and cotton/polyester blend (less than 51% polyester) Personal oil-based products / skin lotions / body lotions removed Wigs or hairpieces removed NA Smoking or tobacco materials removed Books / newspapers / magazines / loose paper removed Cologne, aftershave, perfume and deodorant removed Jewelry removed (may wrap wedding band) Make-up removed NA Hair care products removed Battery operated devices (external) removed Heating patches and chemical warmers removed Titanium eyewear removed NA Nail polish cured  greater than 10 hours NA Casting material cured greater than 10 hours NA Hearing aids removed NA Loose dentures or partials removed removed by patient Prosthetics have been removed NA Patient demonstrates correct use of air break device (if applicable) Patient concerns have been addressed Patient grounding bracelet on and cord attached to chamber Specifics for Inpatients (  complete in addition to above) Medication sheet sent with patient NA Intravenous medications needed or due during therapy sent with patient NA Drainage tubes (e.g. nasogastric tube or chest tube secured and vented) NA Endotracheal or Tracheotomy tube secured NA Cuff deflated of air and inflated with saline NA Airway suctioned NA Notes The safety checklist was done before treatment was started. Electronic Signature(s) Signed: 11/08/2022 2:29:58 PM By: [name] EMT Entered By: [name] on 11/08/2022 14:29:58

## 2022-11-10 ENCOUNTER — Ambulatory Visit: Payer: No Typology Code available for payment source | Admitting: Family Medicine

## 2022-11-10 ENCOUNTER — Encounter (HOSPITAL_BASED_OUTPATIENT_CLINIC_OR_DEPARTMENT_OTHER): Payer: No Typology Code available for payment source | Admitting: General Surgery

## 2022-11-10 DIAGNOSIS — E1142 Type 2 diabetes mellitus with diabetic polyneuropathy: Secondary | ICD-10-CM | POA: Diagnosis not present

## 2022-11-10 DIAGNOSIS — L97519 Non-pressure chronic ulcer of other part of right foot with unspecified severity: Secondary | ICD-10-CM

## 2022-11-10 DIAGNOSIS — I10 Essential (primary) hypertension: Secondary | ICD-10-CM

## 2022-11-10 DIAGNOSIS — E11621 Type 2 diabetes mellitus with foot ulcer: Secondary | ICD-10-CM

## 2022-11-10 DIAGNOSIS — E782 Mixed hyperlipidemia: Secondary | ICD-10-CM | POA: Diagnosis not present

## 2022-11-10 MED ORDER — FREESTYLE LIBRE 2 SENSOR MISC: 3 refills | Status: DC

## 2022-11-10 NOTE — Progress Notes (Signed)
Subjective:  Patient ID: [name], male    DOB: 1950/12/16  Age: 72 y.o. MRN: [id number]  CC: Chief Complaint  Patient presents with   Diabetes    And CKD getting ready to begin kidney dialysys  Refill freestyle libre sensors    HPI:  72 year old male with hypertension, type 2 diabetes with complications including end-stage renal disease, anemia chronic disease, hyperlipidemia, history of stroke presents for follow-up.  Patient has had recent AV graft for upcoming hemodialysis.  He is followed closely by nephrology.  He is followed by wound care currently.  He is getting routine debridement and is undergoing hyperbaric oxygen treatment as well.  Patient's blood sugars are currently well-controlled.  He is in need of his freestyle libre sensors.  He is currently on NovoLog 70/30 30 units twice daily.  He remains on Trulicity as well.  He denies any side effects from Trulicity.  Trulicity does not require renal dosing.  Patient's blood pressure is slightly elevated today.  He is on amlodipine and torsemide.    Patient Active Problem List   Diagnosis Date Noted   Diabetic ulcer of right great toe ([place]) 11/10/2022   CKD (chronic kidney disease) stage 5, GFR less than 15 ml/min ([place]) 07/12/2022   Former smoker 07/12/2022   History of stroke 07/12/2022   Diabetic neuropathy ([place]) 10/28/2021   Anemia in chronic kidney disease 05/27/2020   History of amputation of lesser toe of left foot ([place]) 08/18/2015   Hyperlipidemia, mixed 08/18/2015   Type 2 diabetes mellitus with peripheral neuropathy ([place]) 08/18/2015   Essential hypertension 04/17/2013    Social Hx   Social History   Socioeconomic History   Marital status: Married    Spouse name: Geni Bers   Number of children: Not on file   Years of education: Not on file   Highest education level: Not on file  Occupational History   Not on file  Tobacco Use   Smoking status: Former   Smokeless tobacco: Never  Brewing technologist Use: Never used  Substance and Sexual Activity   Alcohol use: Not Currently   Drug use: Never   Sexual activity: Not on file  Other Topics Concern   Not on file  Social History Narrative   Not on file   Social Determinants of Health   Financial Resource Strain: Not on file  Food Insecurity: Not on file  Transportation Needs: No Transportation Needs (08/17/2022)   PRAPARE - Hydrologist (Medical): No    Lack of Transportation (Non-Medical): No  Physical Activity: Not on file  Stress: Not on file  Social Connections: Not on file    Review of Systems  Constitutional: Negative.   Skin:  Positive for wound.   Objective:  BP (!) 155/78   Pulse (!) 107   Temp 97.7 F (36.5 C)   Ht 6' (1.829 m)   Wt 193 lb (87.5 kg)   SpO2 98%   BMI 26.18 kg/m      11/10/2022    1:32 PM 11/10/2022    1:26 PM 10/20/2022   10:50 AM  BP/Weight  Systolic BP [id number]  Diastolic BP 78 80 66  Wt. (Lbs)  193   BMI  26.18 kg/m2     Physical Exam Constitutional:      General: He is not in acute distress.    Appearance: Normal appearance.  HENT:     Head: Normocephalic and atraumatic.  Eyes:  General:        Right eye: No discharge.        Left eye: No discharge.     Conjunctiva/sclera: Conjunctivae normal.  Cardiovascular:     Rate and Rhythm: Normal rate and regular rhythm.  Pulmonary:     Effort: Pulmonary effort is normal.     Breath sounds: Normal breath sounds. No wheezing, rhonchi or rales.  Abdominal:     General: There is no distension.     Palpations: Abdomen is soft.     Tenderness: There is no abdominal tenderness.  Neurological:     Mental Status: He is alert.     Lab Results  Component Value Date   WBC 9.4 10/18/2022   HGB 11.6 (L) 10/20/2022   HCT 34.0 (L) 10/20/2022   PLT 210 10/18/2022   GLUCOSE 160 (H) 10/20/2022   CHOL 122 07/11/2022   TRIG 219 (H) 07/11/2022   HDL 30 (L) 07/11/2022   LDLCALC 56 07/11/2022   ALT  90 (H) 10/05/2022   AST 32 10/05/2022   NA 144 10/20/2022   K 4.9 10/20/2022   CL 114 (H) 10/20/2022   CREATININE 4.80 (H) 10/20/2022   BUN 35 (H) 10/20/2022   CO2 23 10/18/2022   HGBA1C 7.7 (H) 07/11/2022     Assessment & Plan:   Problem List Items Addressed This Visit       Cardiovascular and Mediastinum   Essential hypertension    Patient's blood pressure is mildly elevated here today.  Defer to nephrology.          Endocrine   Type 2 diabetes mellitus with peripheral neuropathy (HCC)    Last A1c 7.7.  Needs A1c today.  May need dose increase in insulin.  His current blood sugar readings are at goal based off of his freestyle libre. Continue Trulicity.  Continue current dosing insulin.  Will change insulin dosing based on A1c.  Freestyle libre sensors refilled today.      Relevant Medications   Continuous Blood Gluc Sensor (FREESTYLE LIBRE 2 SENSOR) MISC   Other Relevant Orders   Hemoglobin A1c   Diabetic ulcer of right great toe [facility])    Patient will continue close follow-up with wound care.        Other   Hyperlipidemia, mixed    LDL at goal.  Continue Lipitor.       Meds ordered this encounter  Medications   Continuous Blood Gluc Sensor (FREESTYLE LIBRE 2 SENSOR) MISC    Sig: USE TO CHECK BLOOD GLUCOSE CONTINUOUSLY. CHANGE SENSOR EVERY 14 DAYS    Dispense:  6 each    Refill:  3    Follow-up:  Return in about 3 months (around 02/08/2023).  [place]

## 2022-11-10 NOTE — Assessment & Plan Note (Signed)
Patient will continue close follow-up with wound care.

## 2022-11-10 NOTE — Assessment & Plan Note (Signed)
Patient's blood pressure is mildly elevated here today.  Defer to nephrology.

## 2022-11-10 NOTE — Assessment & Plan Note (Addendum)
Last A1c 7.7.  Needs A1c today.  May need dose increase in insulin.  His current blood sugar readings are at goal based off of his freestyle libre. Continue Trulicity.  Continue current dosing insulin.  Will change insulin dosing based on A1c.  Freestyle libre sensors refilled today.

## 2022-11-10 NOTE — Patient Instructions (Signed)
A1C today.  Continue your current medications.  Follow up in 3 months.

## 2022-11-10 NOTE — Assessment & Plan Note (Signed)
LDL at goal.  Continue Lipitor.

## 2022-11-11 LAB — HEMOGLOBIN A1C
Est. average glucose Bld gHb Est-mCnc: 148 mg/dL
Hgb A1c MFr Bld: 6.8 % — ABNORMAL HIGH (ref 4.8–5.6)

## 2022-11-13 ENCOUNTER — Encounter (HOSPITAL_BASED_OUTPATIENT_CLINIC_OR_DEPARTMENT_OTHER): Payer: No Typology Code available for payment source | Admitting: General Surgery

## 2022-11-13 DIAGNOSIS — M86671 Other chronic osteomyelitis, right ankle and foot: Secondary | ICD-10-CM | POA: Diagnosis not present

## 2022-11-13 DIAGNOSIS — E11621 Type 2 diabetes mellitus with foot ulcer: Secondary | ICD-10-CM | POA: Diagnosis not present

## 2022-11-13 LAB — GLUCOSE, CAPILLARY
Glucose-Capillary: 156 mg/dL — ABNORMAL HIGH (ref 70–99)
Glucose-Capillary: 237 mg/dL — ABNORMAL HIGH (ref 70–99)

## 2022-11-13 NOTE — Progress Notes (Signed)
[place], Jorrell ([id number]) [id number] Page 1 of 2 Visit Report for 11/13/2022 Problem List Details Patient Name: Date of Service: [name], [name] 11/13/2022 12:00 PM Medical Record Number: [id number] Patient Account Number: [id number] Date of Birth/Sex: Treating RN: 1951-08-16 (72 y.o. 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 10/12/2022 No Yes N18.5 Chronic kidney disease, stage 5 10/12/2022 No Yes M86.9 Osteomyelitis, unspecified 10/12/2022 No Yes E11.42 Type 2 diabetes mellitus with diabetic polyneuropathy 10/12/2022 No Yes I10 Essential (primary) hypertension 10/12/2022 No Yes Inactive Problems Resolved Problems Electronic Signature(s) Signed: 11/13/2022 2:59:02 PM By: Valeria Batman EMT Signed: 11/13/2022 4:34:30 PM By: Fredirick Maudlin MD FACS Entered By: Valeria Batman on 11/13/2022 14:59:02 -------------------------------------------------------------------------------- SuperBill Details Patient Name: Date of Service: [facility] [name], [name] 11/13/2022 Medical Record Number: [id number] Patient Account Number: [id number] Date of Birth/Sex: Treating RN: [birth year] (72 y.o. [name] Primary Care Provider: [name] Other Clinician: Valeria Batman Referring Provider: Treating Provider/Extender: [name] Weeks in Treatment: 4 [place], [name] ([id number]) [id number] Page 2 of 2 Diagnosis Coding ICD-10 Codes Code Description L97.514 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.621 Type 2 diabetes mellitus  with foot ulcer N18.5 Chronic kidney disease, stage 5 M86.9 Osteomyelitis, unspecified E11.42 Type 2 diabetes mellitus with diabetic polyneuropathy I10 Essential (primary) hypertension Facility Procedures : CPT4 Code Description: [id number] G0277-(Facility Use Only) HBOT full body chamber, 70mn , ICD-10 Diagnosis Description M86.9 Osteomyelitis, unspecified L97.514 Non-pressure chronic ulcer of other part of right foot wi E11.621 Type 2 diabetes mellitus  with foot ulcer Modifier: th necrosis Quantity: 4 of bone Physician Procedures : CPT4 Code Description Modifier [id number] PHYS HYPERBARIC OXYGEN THERAPY ICD-10 Diagnosis Description M86.9 Osteomyelitis, unspecified L97.514 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

## 2022-11-13 NOTE — Progress Notes (Addendum)
[place], [place] ([id number]) [id number] Page 1 of 2 Visit Report for 11/13/2022 HBO Details Patient Name: Date of Service: [name], [name] 11/13/2022 12:00 PM Medical Record Number: [id number] Patient Account Number: [id number] Date of Birth/Sex: Treating RN: Jun 09, 1951 (72 y.o. 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 HBO Treatment Details Treatment Number: 9 Patient Type: Outpatient Chamber Type: Monoplace Chamber Serial #: [id number] Treatment Protocol: 2.0 ATA with 90 minutes oxygen, and no air breaks Treatment Details Compression Rate Down: 2.0 psi / minute De-Compression Rate Up: 2.0 psi / minute Air breaks and breathing Decompress Decompress Compress Tx Pressure Begins Reached periods Begins Ends (leave unused spaces blank) Chamber Pressure (ATA 1 2 ------2 1 ) Clock Time (24 hr) 12:05 12:15 - - - - - - 13:45 13:52 Treatment Length: 107 (minutes) Treatment Segments: 4 Vital Signs Capillary Blood Glucose Reference Range: 80 - 120 mg / dl HBO Diabetic Blood Glucose Intervention Range: <131 mg/dl or >249 mg/dl Time Vitals Blood Respiratory Capillary Blood Glucose Pulse Action Type: Pulse: Temperature: Taken: Pressure: Rate: Glucose (mg/dl): Meter #: Oximetry (%) Taken: Pre 11:24 156 Post 13:57 144/62 91 18 97.5 237 Pre 11:56 157/65 101 18 97.5 Treatment Response Treatment Toleration: Well Treatment Completion Status: Treatment Completed without Adverse Event Treatment Notes Dr. Celine Ahr informed of pretreatment heart rate of 101. The patient stated that he had just ate  before coming in for treatment today. He stated that he had a sausage biscuit. Dr. Celine Ahr informed of post treatment blood sugar of 237. Physician HBO Attestation: I certify that I supervised this HBO treatment in accordance with Medicare guidelines. A trained emergency response team is readily available per Yes hospital policies and procedures. Continue HBOT as ordered. Yes Electronic Signature(s) Signed: 11/13/2022 4:36:05 PM By: Fredirick Maudlin MD FACS Previous Signature: 11/13/2022 2:57:57 PM Version By: Valeria Batman EMT Entered By: Fredirick Maudlin on 11/13/2022 16:36:05 [place], [place]yland ([id number] Page 2 of 2 -------------------------------------------------------------------------------- HBO Safety Checklist Details Patient Name: Date of Service: [name], [name] 11/13/2022 12:00 PM Medical Record Number: [id number] Patient Account Number: [id number] Date of Birth/Sex: Treating RN: 04-29-1951 (72 y.o. [name], [name] Primary Care Datha Kissinger: [name] Other Clinician: Valeria Batman Referring Orvill Coulthard: Treating Danna Casella/Extender: [name] Weeks in Treatment: 4 HBO Safety Checklist Items Safety Checklist Consent Form Signed Patient voided / foley secured and emptied When did you last eato 1030 Last dose of injectable or oral agent last night Ostomy pouch emptied and vented if applicable NA All implantable devices assessed, documented and approved Libre2 Dialysis shunt Intravenous access site secured and place NA Valuables secured Linens and cotton and cotton/polyester blend (less than 51% polyester) Personal oil-based products / skin lotions / body lotions removed Wigs or hairpieces removed NA Smoking or tobacco materials removed Books / newspapers / magazines / loose paper removed Cologne, aftershave, perfume and deodorant removed Jewelry removed (may wrap wedding band) Make-up removed NA Hair care products  removed Battery operated devices (external) removed Heating patches and chemical warmers removed Titanium eyewear removed NA Nail polish cured greater than 10 hours NA Casting material cured greater than 10 hours NA Hearing aids removed NA Loose dentures or partials removed removed by patient Prosthetics have been removed  NA Patient demonstrates correct use of air break device (if applicable) Patient concerns have been addressed Patient grounding bracelet on and cord attached to chamber Specifics for Inpatients (complete in addition to above) Medication sheet sent with patient NA Intravenous medications needed or due during therapy sent with patient NA Drainage tubes (e.g. nasogastric tube or chest tube secured and vented) NA Endotracheal or Tracheotomy tube secured NA Cuff deflated of air and inflated with saline NA Airway suctioned NA Notes The safety checklist was done before the treatment was started. Electronic Signature(s) Signed: 11/13/2022 2:54:04 PM By: Valeria Batman EMT Entered By: Valeria Batman on 11/13/2022 14:54:04

## 2022-11-13 NOTE — Progress Notes (Signed)
[place], [name] ([id number]) [id number] Page 1 of 2 Visit Report for 11/13/2022 Arrival Information Details Patient Name: Date of Service: [name], [name] 11/13/2022 12:00 PM Medical Record Number: [id number] Patient Account Number: [id number] Date of Birth/Sex: Treating RN: November 30, 1950 (72 y.o. [name], [name] Primary Care [name]: [name] Other Clinician: Valeria Batman Referring Seward Coran: Treating Clayson Riling/Extender: [name] Weeks in Treatment: 4 Visit Information History Since Last Visit All ordered tests and consults were completed: Yes Patient Arrived: Cane Added or deleted any medications: No Arrival Time: 11:18 Any new allergies or adverse reactions: No Accompanied By: None Had a fall or experienced change in No Transfer Assistance: None activities of daily living that may affect Patient Identification Verified: Yes risk of falls: Secondary Verification Process Completed: Yes Signs or symptoms of abuse/neglect since last visito No Patient Requires Transmission-Based Precautions: No Hospitalized since last visit: No Patient Has Alerts: No Implantable device outside of the clinic excluding No cellular tissue based products placed in the center since last visit: Pain Present Now: No Electronic Signature(s) Signed: 11/13/2022 2:52:36 PM By: Valeria Batman EMT Entered By: Valeria Batman on 11/13/2022 14:52:35 -------------------------------------------------------------------------------- Encounter Discharge Information Details Patient Name: Date of Service: [facility] [name], [name] 11/13/2022 12:00 PM Medical Record Number: [id number] Patient Account Number: [id number] Date of Birth/Sex: Treating RN: 12-Oct-1950 (72 y.o. [name] Primary Care Avi Kerschner: [name] Other Clinician: Valeria Batman Referring Rhett Najera: Treating Missie Gehrig/Extender: [name] Weeks in Treatment: 4 Encounter Discharge  Information Items Discharge Condition: Stable Ambulatory Status: Cane Discharge Destination: Home Transportation: Private Auto Accompanied By: None Schedule Follow-up Appointment: Yes Clinical Summary of Care: Electronic Signature(s) Signed: 11/13/2022 2:59:42 PM By: Valeria Batman EMT Entered By: Valeria Batman on 11/13/2022 14:59:42 [name] ([id number].pdf Page 2 of 2 -------------------------------------------------------------------------------- Vitals Details Patient Name: Date of Service: [name] 11/13/2022 12:00 PM Medical Record Number: [id number] Patient Account Number: [id number] Date of Birth/Sex: Treating RN: 1951-05-10 (72 y.o. [name] Primary Care Freya Zobrist: [name] Other Clinician: Valeria Batman Referring Tirza Senteno: Treating Creta Dorame/Extender: [name] Weeks in Treatment: 4 Vital Signs Time Taken: 11:24 Capillary Blood Glucose (mg/dl): 156 Height (in): 72 Reference Range: 80 - 120 mg / dl Weight (lbs): 185 Body Mass Index (BMI): 25.1 Electronic Signature(s) Signed: 11/13/2022 2:52:56 PM By: Valeria Batman EMT Entered By: Valeria Batman on 11/13/2022 14:52:56

## 2022-11-14 ENCOUNTER — Encounter (HOSPITAL_BASED_OUTPATIENT_CLINIC_OR_DEPARTMENT_OTHER): Payer: No Typology Code available for payment source | Admitting: General Surgery

## 2022-11-14 ENCOUNTER — Other Ambulatory Visit (INDEPENDENT_AMBULATORY_CARE_PROVIDER_SITE_OTHER): Payer: Self-pay | Admitting: Vascular Surgery

## 2022-11-14 DIAGNOSIS — E11621 Type 2 diabetes mellitus with foot ulcer: Secondary | ICD-10-CM | POA: Diagnosis not present

## 2022-11-14 DIAGNOSIS — Z95828 Presence of other vascular implants and grafts: Secondary | ICD-10-CM

## 2022-11-14 DIAGNOSIS — M86671 Other chronic osteomyelitis, right ankle and foot: Secondary | ICD-10-CM | POA: Diagnosis not present

## 2022-11-14 DIAGNOSIS — N186 End stage renal disease: Secondary | ICD-10-CM

## 2022-11-14 LAB — GLUCOSE, CAPILLARY
Glucose-Capillary: 156 mg/dL — ABNORMAL HIGH (ref 70–99)
Glucose-Capillary: 225 mg/dL — ABNORMAL HIGH (ref 70–99)

## 2022-11-14 NOTE — Progress Notes (Signed)
[place], Milbert ([id number]) [id number] Page 1 of 2 Visit Report for 11/14/2022 Arrival Information Details Patient Name: Date of Service: [place], [name] 11/14/2022 12:00 PM Medical Record Number: [id number] Patient Account Number: [id number] Date of Birth/Sex: Treating RN: 26-Nov-1950 (72 y.o. [name] Primary Care Maynor Mwangi: [name] Other Clinician: Donavan Burnet Referring Kanda Deluna: Treating Wisdom Seybold/Extender: [name] Weeks in Treatment: 4 Visit Information History Since Last Visit All ordered tests and consults were completed: Yes Patient Arrived: Ambulatory Added or deleted any medications: No Arrival Time: 11:24 Any new allergies or adverse reactions: No Accompanied By: self Had a fall or experienced change in No Transfer Assistance: None activities of daily living that may affect Patient Identification Verified: Yes risk of falls: Secondary Verification Process Completed: Yes Signs or symptoms of abuse/neglect since last visito No Patient Requires Transmission-Based Precautions: No Hospitalized since last visit: No Patient Has Alerts: No Implantable device outside of the clinic excluding No cellular tissue based products placed in the center since last visit: Pain Present Now: No Electronic Signature(s) Signed: 11/14/2022 2:25:47 PM By: Donavan Burnet CHT EMT BS , , Entered By: Donavan Burnet on 11/14/2022 14:25:47 -------------------------------------------------------------------------------- Encounter Discharge Information Details Patient Name: Date of Service: [facility] [name], [name] 11/14/2022 12:00 PM Medical Record Number: [id number] Patient Account Number: [id number] Date of Birth/Sex: Treating RN: 25-Apr-1951 (72 y.o. [name] Primary Care Rayley Gao: [name] Other Clinician: Donavan Burnet Referring Roi Jafari: Treating Marcus Groll/Extender: [name] Weeks in  Treatment: 4 Encounter Discharge Information Items Discharge Condition: Stable Ambulatory Status: Ambulatory Discharge Destination: Home Transportation: Private Auto Accompanied By: self Schedule Follow-up Appointment: No Clinical Summary of Care: Electronic Signature(s) Signed: 11/14/2022 2:35:31 PM By: Donavan Burnet CHT EMT BS , , Entered By: Donavan Burnet on 11/14/2022 14:35:31 Mohammad, Tyrez ([id number] Page 2 of 2 -------------------------------------------------------------------------------- Vitals Details Patient Name: Date of Service: [facility] [name], [name] 11/14/2022 12:00 PM Medical Record Number: [id number] Patient Account Number: [id number] Date of Birth/Sex: Treating RN: 03-25-1951 (72 y.o. [name] Primary Care Gift Rueckert: [name] Other Clinician: Donavan Burnet Referring Maxden Naji: Treating Yasaman Kolek/Extender: [name] Weeks in Treatment: 4 Vital Signs Time Taken: 11:45 Temperature (F): 97.9 Height (in): 72 Pulse (bpm): 77 Weight (lbs): 185 Respiratory Rate (breaths/min): 18 Body Mass Index (BMI): 25.1 Blood Pressure (mmHg): 160/63 Capillary Blood Glucose (mg/dl): 156 Reference Range: 80 - 120 mg / dl Electronic Signature(s) Signed: 11/14/2022 2:26:24 PM By: Donavan Burnet CHT EMT BS , , Entered By: Donavan Burnet on 11/14/2022 [id number]

## 2022-11-15 ENCOUNTER — Ambulatory Visit (INDEPENDENT_AMBULATORY_CARE_PROVIDER_SITE_OTHER): Payer: No Typology Code available for payment source

## 2022-11-15 ENCOUNTER — Encounter (HOSPITAL_BASED_OUTPATIENT_CLINIC_OR_DEPARTMENT_OTHER): Payer: No Typology Code available for payment source | Admitting: General Surgery

## 2022-11-15 ENCOUNTER — Encounter (INDEPENDENT_AMBULATORY_CARE_PROVIDER_SITE_OTHER): Payer: Self-pay | Admitting: Nurse Practitioner

## 2022-11-15 ENCOUNTER — Ambulatory Visit (INDEPENDENT_AMBULATORY_CARE_PROVIDER_SITE_OTHER): Payer: No Typology Code available for payment source | Admitting: Nurse Practitioner

## 2022-11-15 VITALS — BP 175/65 | HR 99 | Ht 72.0 in | Wt 193.0 lb

## 2022-11-15 DIAGNOSIS — Z95828 Presence of other vascular implants and grafts: Secondary | ICD-10-CM

## 2022-11-15 DIAGNOSIS — N186 End stage renal disease: Secondary | ICD-10-CM

## 2022-11-15 DIAGNOSIS — E782 Mixed hyperlipidemia: Secondary | ICD-10-CM

## 2022-11-15 DIAGNOSIS — E1142 Type 2 diabetes mellitus with diabetic polyneuropathy: Secondary | ICD-10-CM

## 2022-11-15 NOTE — Progress Notes (Signed)
[place], [place] ([id number]) [id number] Page 1 of 2 Visit Report for 11/14/2022 HBO Details Patient Name: Date of Service: [name], [name] 11/14/2022 12:00 PM Medical Record Number: [id number] Patient Account Number: [id number] Date of Birth/Sex: Treating RN: June 11, 1951 (72 y.o. 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 HBO Treatment Details Treatment Number: 10 Patient Type: Outpatient Chamber Type: Monoplace Chamber Serial #: [id number] Treatment Protocol: 2.0 ATA with 90 minutes oxygen, and no air breaks Treatment Details Compression Rate Down: 1.5 psi / minute De-Compression Rate Up: 2.0 psi / minute Air breaks and breathing Decompress Decompress Compress Tx Pressure Begins Reached periods Begins Ends (leave unused spaces blank) Chamber Pressure (ATA 1 2 ------2 1 ) Clock Time (24 hr) 11:49 12:02 - - - - - - 13:32 13:43 Treatment Length: 114 (minutes) Treatment Segments: 4 Vital Signs Capillary Blood Glucose Reference Range: 80 - 120 mg / dl HBO Diabetic Blood Glucose Intervention Range: <131 mg/dl or >249 mg/dl Type: Time Vitals Blood Respiratory Capillary Blood Glucose Pulse Action Pulse: Temperature: Taken: Pressure: Rate: Glucose (mg/dl): Meter #: Oximetry (%) Taken: Pre 11:45 160/63 77 18 97.9 156 1 none per protocol Post 13:50 133/67 82 18 98.8 225 1 none per protocol Treatment Response Treatment Toleration: Well Treatment Completion Status: Treatment Completed without Adverse Event Treatment Notes Patient arrived,vital signs were taken and blood glucose was  156 mg/dL. Patient stated that he ate. He prepared for treatment. After safety check was performed patient was placed in the chamber and chamber was compressed at 1 psi/min and was increased to 2 psi/min after patient confirmed proper ear equalization. Patient tolerated treatment and decompression of the chamber at 2 psi/min. Post treatment glucose level was 225 mg/dL. Patient was stable upon discharge. Physician HBO Attestation: I certify that I supervised this HBO treatment in accordance with Medicare guidelines. A trained emergency response team is readily available per Yes hospital policies and procedures. Continue HBOT as ordered. Yes Electronic Signature(s) Signed: 11/14/2022 3:49:44 PM By: [name] Previous Signature: 11/14/2022 2:31:28 PM Version By: [name] CHT EMT BS , , Entered By: [name] on 11/14/2022 15:49:44 [name], [name] ([id number] Page 2 of 2 -------------------------------------------------------------------------------- HBO Safety Checklist Details Patient Name: Date of Service: [name], [name] 11/14/2022 12:00 PM Medical Record Number: [id number] Patient Account Number: [id number] Date of Birth/Sex: Treating RN: 1951/09/19 (72 y.o. [name] Primary Care Tanganika Barradas: [name] Other Clinician: Donavan [name] Referring Justina Bertini: Treating Quaneshia Wareing/Extender: [name] Weeks in Treatment: 4 HBO Safety Checklist Items Safety Checklist Consent Form Signed Patient voided / foley secured and emptied When did you last eato 1045 Last dose of injectable or oral agent Last evening Ostomy pouch emptied and vented if applicable NA All implantable devices assessed, documented and approved NA Intravenous access site secured and place NA Valuables secured Linens and cotton and cotton/polyester blend (less than 51% polyester) Personal oil-based products / skin lotions / body  lotions removed Wigs or hairpieces removed NA Smoking or tobacco materials removed NA Books / newspapers / magazines / loose paper removed Cologne, aftershave, perfume and deodorant removed Jewelry removed (may wrap wedding band) Make-up removed NA Hair care products removed Battery operated devices (external) removed Heating  patches and chemical warmers removed Titanium eyewear removed Nail polish cured greater than 10 hours NA Casting material cured greater than 10 hours NA Hearing aids removed NA Loose dentures or partials removed dentures removed Prosthetics have been removed NA Patient demonstrates correct use of air break device (if applicable) Patient concerns have been addressed Patient grounding bracelet on and cord attached to chamber Specifics for Inpatients (complete in addition to above) Medication sheet sent with patient NA Intravenous medications needed or due during therapy sent with patient NA Drainage tubes (e.g. nasogastric tube or chest tube secured and vented) NA Endotracheal or Tracheotomy tube secured NA Cuff deflated of air and inflated with saline NA Airway suctioned NA Notes Paper version used prior to treatment start. Electronic Signature(s) Signed: 11/14/2022 2:27:32 PM By: [name] CHT EMT BS , , Entered By: [name] on 11/14/2022 14:27:32

## 2022-11-15 NOTE — Progress Notes (Signed)
[place], [name] ([id number]) [id number] Page 1 of 1 Visit Report for 11/14/2022 SuperBill Details Patient Name: Date of Service: [name], [name] 11/14/2022 Medical Record Number: [id number] Patient Account Number: [id number] Date of Birth/Sex: Treating RN: December 14, 1950 (72 y.o. 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, stage 5 M86.9 Osteomyelitis, unspecified E11.42 Type 2 diabetes mellitus with diabetic polyneuropathy I10 Essential (primary) hypertension Facility Procedures CPT4 Code Description Modifier Quantity [id number] G0277-(Facility Use Only) HBOT full body chamber, 63mn , 4 ICD-10 Diagnosis Description M86.9 Osteomyelitis, unspecified 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.621 Type 2 diabetes mellitus with foot ulcer Physician Procedures Quantity CPT4 Code Description Modifier [id number] PHYS HYPERBARIC OXYGEN THERAPY 1 ICD-10 Diagnosis Description M86.9 Osteomyelitis, unspecified 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

## 2022-11-16 ENCOUNTER — Encounter (HOSPITAL_BASED_OUTPATIENT_CLINIC_OR_DEPARTMENT_OTHER): Payer: No Typology Code available for payment source | Admitting: General Surgery

## 2022-11-16 DIAGNOSIS — M86671 Other chronic osteomyelitis, right ankle and foot: Secondary | ICD-10-CM | POA: Diagnosis not present

## 2022-11-16 DIAGNOSIS — L97512 Non-pressure chronic ulcer of other part of right foot with fat layer exposed: Secondary | ICD-10-CM | POA: Diagnosis not present

## 2022-11-16 DIAGNOSIS — E11621 Type 2 diabetes mellitus with foot ulcer: Secondary | ICD-10-CM | POA: Diagnosis not present

## 2022-11-16 LAB — GLUCOSE, CAPILLARY
Glucose-Capillary: 257 mg/dL — ABNORMAL HIGH (ref 70–99)
Glucose-Capillary: 159 mg/dL — ABNORMAL HIGH (ref 70–99)

## 2022-11-17 ENCOUNTER — Encounter (HOSPITAL_BASED_OUTPATIENT_CLINIC_OR_DEPARTMENT_OTHER): Payer: No Typology Code available for payment source | Admitting: General Surgery

## 2022-11-17 DIAGNOSIS — M86671 Other chronic osteomyelitis, right ankle and foot: Secondary | ICD-10-CM | POA: Diagnosis not present

## 2022-11-17 DIAGNOSIS — E11621 Type 2 diabetes mellitus with foot ulcer: Secondary | ICD-10-CM | POA: Diagnosis not present

## 2022-11-17 LAB — GLUCOSE, CAPILLARY
Glucose-Capillary: 146 mg/dL — ABNORMAL HIGH (ref 70–99)
Glucose-Capillary: 223 mg/dL — ABNORMAL HIGH (ref 70–99)

## 2022-11-17 NOTE — Progress Notes (Signed)
[place], Nijah ([id number]) [id number] Page 1 of 2 Visit Report for 11/16/2022 Problem List Details Patient Name: Date of Service: [name], [name] 11/16/2022 12:00 PM Medical Record Number: [id number] Patient Account Number: [id number] Date of Birth/Sex: Treating RN: 1950/11/05 (72 y.o. 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, stage 5 10/12/2022 No Yes M86.9 Osteomyelitis, unspecified 10/12/2022 No Yes E11.42 Type 2 diabetes mellitus with diabetic polyneuropathy 10/12/2022 No Yes I10 Essential (primary) hypertension 10/12/2022 No Yes Inactive Problems Resolved Problems Electronic Signature(s) Signed: 11/16/2022 3:29:13 PM By: Valeria Batman EMT Signed: 11/16/2022 3:44:25 PM By: Fredirick Maudlin MD FACS Entered By: Valeria Batman on 11/16/2022 15:29:13 -------------------------------------------------------------------------------- SuperBill Details Patient Name: Date of Service: [facility] [name], [name] 11/16/2022 Medical Record Number: [id number] Patient Account Number: [id number] Date of Birth/Sex: Treating RN: Mar 28, 1951 (73 y.o. 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, stage 5 M86.9 Osteomyelitis, unspecified E11.42 Type 2 diabetes mellitus with diabetic polyneuropathy I10 Essential (primary) hypertension Facility Procedures : CPT4 Code Description: [id number] G0277-(Facility Use Only) HBOT full body chamber, 74mn , ICD-10 Diagnosis Description L97.514 Non-pressure chronic ulcer of other part of right foot wi M86.9 Osteomyelitis, unspecified E11.621 Type 2 diabetes mellitus  with foot ulcer Modifier: th necrosis Quantity: 4 of bone Physician Procedures : CPT4 Code Description Modifier [id number] PHYS HYPERBARIC OXYGEN THERAPY ICD-10 Diagnosis Description L97.514 Non-pressure chronic ulcer of other part of right foot with necrosis o M86.9 Osteomyelitis, unspecified E11.621 Type 2 diabetes  mellitus with foot ulcer Quantity: 1 f bone Electronic Signature(s) Signed: 11/16/2022 3:17:07 PM By: GValeria BatmanEMT Signed: 11/16/2022 3:44:25 PM By: CFredirick MaudlinMD FACS Entered By: GValeria Batmanon 11/16/2022 15:17:07

## 2022-11-17 NOTE — Progress Notes (Signed)
[place], Tag ([id number]) [id number] Page 1 of 2 Visit Report for 11/16/2022 HBO Details Patient Name: Date of Service: [name], [name] 11/16/2022 12:00 PM Medical Record Number: [id number] Patient Account Number: [id number] Date of Birth/Sex: Treating RN: Jul 12, 1951 (72 y.o. 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 HBO Treatment Details Treatment Number: 11 Patient Type: Outpatient Chamber Type: Monoplace Chamber Serial #: [id number] Treatment Protocol: 2.0 ATA with 90 minutes oxygen, and no air breaks Treatment Details Compression Rate Down: 2.0 psi / minute De-Compression Rate Up: 2.0 psi / minute Air breaks and breathing Decompress Decompress Compress Tx Pressure Begins Reached periods Begins Ends (leave unused spaces blank) Chamber Pressure (ATA 1 2 ------2 1 ) Clock Time (24 hr) 12:30 12:39 - - - - - - 14:10 14:18 Treatment Length: 108 (minutes) Treatment Segments: 4 Vital Signs Capillary Blood Glucose Reference Range: 80 - 120 mg / dl HBO Diabetic Blood Glucose Intervention Range: <131 mg/dl or >249 mg/dl Time Vitals Blood Respiratory Capillary Blood Glucose Pulse Action Type: Pulse: Temperature: Taken: Pressure: Rate: Glucose (mg/dl): Meter #: Oximetry (%) Taken: Pre 11:54 257 Post 14:22 133/60 85 18 159 Pre 12:21 141/69 97 18 98.2 Treatment Response Treatment Toleration: Well Treatment Completion Status: Treatment Completed without Adverse Event Treatment Notes The patient stated that he had just had lunch before coming in for treatment at [place]. Physician  HBO Attestation: I certify that I supervised this HBO treatment in accordance with Medicare guidelines. A trained emergency response team is readily available per Yes hospital policies and procedures. Continue HBOT as ordered. Yes Electronic Signature(s) Signed: 11/16/2022 4:12:34 PM By: Fredirick Maudlin MD FACS Previous Signature: 11/16/2022 3:16:21 PM Version By: Valeria Batman EMT Entered By: Fredirick Maudlin on 11/16/2022 16:12:34 [place], Lenny ([id number] Page 2 of 2 -------------------------------------------------------------------------------- HBO Safety Checklist Details Patient Name: Date of Service: [name], [name] 11/16/2022 12:00 PM Medical Record Number: [id number] Patient Account Number: [id number] Date of Birth/Sex: Treating RN: [birth year] (72 y.o. [name] Primary Care Tameem Pullara: [name] Other Clinician: Valeria Batman Referring Shayra Anton: Treating Velta Rockholt/Extender: [name] Weeks in Treatment: 5 HBO Safety Checklist Items Safety Checklist Consent Form Signed Patient voided / foley secured and emptied When did you last eato 1100 Last dose of injectable or oral agent 1105 Ostomy pouch emptied and vented if applicable NA All implantable devices assessed, documented and approved Libre2 Dialysis shunt Intravenous access site secured and place NA Valuables secured Linens and cotton and cotton/polyester blend (less than 51% polyester) Personal oil-based products / skin lotions / body lotions removed Wigs or hairpieces removed NA Smoking or tobacco materials removed Books / newspapers / magazines / loose paper removed Cologne, aftershave, perfume and deodorant removed Jewelry removed (may wrap wedding band) Make-up removed NA Hair care products removed NA Battery operated devices (external) removed Heating patches and chemical warmers removed Titanium eyewear removed NA Nail polish cured greater  than 10 hours NA Casting material cured greater than 10 hours NA Hearing aids removed NA Loose dentures or partials removed NA Prosthetics have been removed NA Patient demonstrates correct use of air break device (if applicable) Patient concerns have been addressed Patient grounding bracelet on and cord attached to chamber Specifics for Inpatients (  complete in addition to above) Medication sheet sent with patient NA Intravenous medications needed or due during therapy sent with patient NA Drainage tubes (e.g. nasogastric tube or chest tube secured and vented) NA Endotracheal or Tracheotomy tube secured NA Cuff deflated of air and inflated with saline NA Airway suctioned NA Notes The safety checklist was done before the treatment started. Electronic Signature(s) Signed: 11/16/2022 3:13:32 PM By: Valeria Batman EMT Entered By: Valeria Batman on 11/16/2022 15:13:31

## 2022-11-17 NOTE — Progress Notes (Addendum)
[name], [name] ([id number]) [id number] Page 1 of 2 Visit Report for 11/16/2022 Arrival Information Details Patient Name: Date of Service: [name], [name] 11/16/2022 12:00 PM Medical Record Number: [id number] Patient Account Number: [id number] Date of Birth/Sex: Treating RN: January 24, 1951 (72 y.o. [name] Primary Care Bresha Hosack: [name] Other Clinician: Valeria Batman Referring Talayia Hjort: Treating Abundio Teuscher/Extender: [name] Weeks in Treatment: 5 Visit Information History Since Last Visit All ordered tests and consults were completed: Yes Patient Arrived: [name] Added or deleted any medications: No Arrival Time: 11:18 Any new allergies or adverse reactions: No Accompanied By: None Had a fall or experienced change in No Transfer Assistance: None activities of daily living that may affect Patient Identification Verified: Yes risk of falls: Secondary Verification Process Completed: Yes Signs or symptoms of abuse/neglect since last visito No Patient Requires Transmission-Based Precautions: No Hospitalized since last visit: No Patient Has Alerts: No Implantable device outside of the clinic excluding No cellular tissue based products placed in the center since last visit: Pain Present Now: No Electronic Signature(s) Signed: 11/16/2022 3:11:01 PM By: Valeria Batman EMT Entered By: Valeria Batman on 11/16/2022 15:11:01 -------------------------------------------------------------------------------- Encounter Discharge Information Details Patient Name: Date of Service: [facility] [name], [name] 11/16/2022 12:00 PM Medical Record Number: [id number] Patient Account Number: [id number] Date of Birth/Sex: Treating RN: [birth year] (72 y.o. [name] Primary Care Jancie Kercher: [name] Other Clinician: Valeria Batman Referring Kerrianne Jeng: Treating Laya Letendre/Extender: [name] Weeks in Treatment: 5 Encounter Discharge  Information Items Discharge Condition: Stable Ambulatory Status: Cane Discharge Destination: Other (Note Required) Transportation: Private Auto Accompanied By: None Schedule Follow-up Appointment: Yes Clinical Summary of Care: Notes The patient had a Wound Care appointment after his treatment today. Electronic Signature(s) Signed: 11/16/2022 3:30:30 PM By: Valeria Batman EMT Entered By: Valeria Batman on 11/16/2022 15:30:30 Primo, [name] ([id number].pdf Page 2 of 2 -------------------------------------------------------------------------------- Vitals Details Patient Name: Date of Service: [facility] [name], [name] 11/16/2022 12:00 PM Medical Record Number: [id number] Patient Account Number: [id number] Date of Birth/Sex: Treating RN: 04/30/1951 (72 y.o. [name] Primary Care [name]: [name] Other Clinician: Valeria Batman Referring Jayden Rudge: Treating Xxavier Noon/Extender: [name] Weeks in Treatment: 5 Vital Signs Time Taken: 11:54 Capillary Blood Glucose (mg/dl): 257 Height (in): 72 Reference Range: 80 - 120 mg / dl Weight (lbs): 185 Body Mass Index (BMI): 25.1 Electronic Signature(s) Signed: 11/16/2022 3:11:18 PM By: Valeria Batman EMT Entered By: Valeria Batman on 11/16/2022 15:11:18

## 2022-11-18 NOTE — Progress Notes (Signed)
Naselle, Quincey ([id number]) [id number] Page 1 of 2 Visit Report for 11/17/2022 Arrival Information Details Patient Name: Date of Service: [place], [name] 11/17/2022 12:00 PM Medical Record Number: [id number] Patient Account Number: [id number] Date of Birth/Sex: Treating RN: [birth year] (72 y.o. [name] Primary Care Yoshito Gaza: [name] Other Clinician: Valeria Batman Referring [place]: Treating Bianka Liberati/Extender: [name] Weeks in Treatment: 5 Visit Information History Since Last Visit All ordered tests and consults were completed: Yes Patient Arrived: [name] Added or deleted any medications: No Arrival Time: 11:23 Any new allergies or adverse reactions: No Accompanied By: None Had a fall or experienced change in No Transfer Assistance: None activities of daily living that may affect Patient Identification Verified: Yes risk of falls: Secondary Verification Process Completed: Yes Signs or symptoms of abuse/neglect since last visito No Patient Requires Transmission-Based Precautions: No Hospitalized since last visit: No Patient Has Alerts: No Implantable device outside of the clinic excluding No cellular tissue based products placed in the center since last visit: Pain Present Now: No Electronic Signature(s) Signed: 11/17/2022 1:35:44 PM By: Valeria Batman EMT Entered By: Valeria Batman on 11/17/2022 13:35:44 -------------------------------------------------------------------------------- Encounter Discharge Information Details Patient Name: Date of Service: [facility] [name], [name] 11/17/2022 12:00 PM Medical Record Number: [id number] Patient Account Number: [id number] Date of Birth/Sex: Treating RN: 11/10/1950 (72 y.o. [name] Primary Care Stanely Sexson: [name] Other Clinician: Valeria Batman Referring Kamille Toomey: Treating Juwon Scripter/Extender: [name] Weeks in Treatment: 5 Encounter Discharge  Information Items Discharge Condition: Stable Ambulatory Status: Cane Discharge Destination: Home Transportation: Private Auto Accompanied By: None Schedule Follow-up Appointment: Yes Clinical Summary of Care: Electronic Signature(s) Signed: 11/17/2022 1:57:48 PM By: Valeria Batman EMT Entered By: Valeria Batman on 11/17/2022 13:57:48 Levings, Aryav ([id number].pdf Page 2 of 2 -------------------------------------------------------------------------------- Vitals Details Patient Name: Date of Service: [facility] [name], [name] 11/17/2022 12:00 PM Medical Record Number: [id number] Patient Account Number: [id number] Date of Birth/Sex: Treating RN: [birth year] (72 y.o. [name] Primary Care Mykah Bellomo: [name] Other Clinician: Valeria Batman Referring Kerrick Miler: Treating Trayonna Bachmeier/Extender: [name] Weeks in Treatment: 5 Vital Signs Time Taken: 11:29 Capillary Blood Glucose (mg/dl): 146 Height (in): 72 Reference Range: 80 - 120 mg / dl Weight (lbs): 185 Body Mass Index (BMI): 25.1 Electronic Signature(s) Signed: 11/17/2022 1:36:50 PM By: Valeria Batman EMT Entered By: Valeria Batman on 11/17/2022 13:36:49

## 2022-11-18 NOTE — Progress Notes (Signed)
[name], [name] ([id number]) [id number] Page 1 of 8 Visit Report for 11/16/2022 Arrival Information Details Patient Name: Date of Service: [name], [name] 11/16/2022 2:15 PM Medical Record Number: [id number] Patient Account Number: [id number] Date of Birth/Sex: Treating RN: [birth year] (72 y.o. [name] Primary Care [name]: [name] Other Clinician: Referring [name]: Treating [name]/Extender: [name] in Treatment: 5 Visit Information History Since Last Visit Added or deleted any medications: No Patient Arrived: [name] Any new allergies or adverse reactions: No Arrival Time: 14:41 Had a fall or experienced change in No Accompanied By: self activities of daily living that may affect Transfer Assistance: None risk of falls: Patient Identification Verified: Yes Signs or symptoms of abuse/neglect since last visito No Patient Requires Transmission-Based Precautions: No Hospitalized since last visit: No Patient Has Alerts: No Implantable device outside of the clinic excluding No cellular tissue based products placed in the center since last visit: Has Dressing in Place as Prescribed: Yes Pain Present Now: Yes Electronic Signature(s) Signed: 11/16/2022 5:25:03 PM By: [name] Catholic RN Entered By: [name] on 11/16/2022 14:42:17 -------------------------------------------------------------------------------- Encounter Discharge Information Details Patient Name: Date of Service: [name], [name] 11/16/2022 2:15 PM Medical Record Number: [id number] Patient Account Number: [id number] Date of Birth/Sex: Treating RN: [birth year] (72 y.o. [name] Primary Care Aristeo Hankerson: [name] Other Clinician: Referring Shaylan Tutton: Treating Ninnie Fein/Extender: [name] in Treatment: 5 Encounter Discharge Information Items Post Procedure Vitals Discharge Condition: Stable Temperature (F):  98.6 Ambulatory Status: Cane Pulse (bpm): 85 Discharge Destination: Home Respiratory Rate (breaths/min): 18 Transportation: Private Auto Blood Pressure (mmHg): 133/60 Accompanied By: self Schedule Follow-up Appointment: Yes Clinical Summary of Care: Patient Declined Electronic Signature(s) Signed: 11/16/2022 5:25:03 PM By: [name] Catholic RN Entered By: [name] on 11/16/2022 17:20:02 [name], [name] Arbour ([id number].pdf Page 2 of 8 -------------------------------------------------------------------------------- Lower Extremity Assessment Details Patient Name: Date of ServiceJodene [name], [name] 11/16/2022 2:15 PM Medical Record Number: [id number] Patient Account Number: [id number] Date of Birth/Sex: Treating RN: December 04, 1950 (72 y.o. [name] Primary Care Alondra Sahni: [name] Other Clinician: Referring Morgaine Kimball: Treating Roddie Riegler/Extender: [name] in Treatment: 5 Edema Assessment Assessed: [Left: No] [Right: No] Edema: [Left: N] [Right: o] Calf Left: Right: Point of Measurement: From Medial Instep 37.5 cm Ankle Left: Right: Point of Measurement: From Medial Instep 23.5 cm Vascular Assessment Pulses: Dorsalis Pedis Palpable: [Right:Yes] Electronic Signature(s) Signed: 11/16/2022 5:25:03 PM By: [name] Catholic RN Entered By: [name] on 11/16/2022 14:45:07 -------------------------------------------------------------------------------- Multi Wound Chart Details Patient Name: Date of Service: [name], [name] 11/16/2022 2:15 PM Medical Record Number: [id number] Patient Account Number: [id number] Date of Birth/Sex: Treating RN: April 26, 1951 (72 y.o. M) Primary Care Jordon Bourquin: [name] Other Clinician: Referring Emaleigh Guimond: Treating Whitni Pasquini/Extender: [name] in Treatment: 5 Vital Signs Height(in): 72 Capillary Blood Glucose(mg/dl): 159 Weight(lbs): 185 Pulse(bpm):  81 Body Mass Index(BMI): 25.1 Blood Pressure(mmHg): 133/60 Temperature(F): 98.6 Respiratory Rate(breaths/min): 18 [1:Photos:] [N/A:N/A] Right, Medial T Great oe N/A N/A Wound Location: Pressure Injury N/A N/A Wounding Event: Diabetic Wound/Ulcer of the Lower N/A N/A Primary Etiology: Extremity Acute Osteomyelitis N/A N/A Secondary Etiology: Cataracts, Hypertension, Type II N/A N/A Comorbid History: Diabetes, Osteomyelitis, Neuropathy, Confinement Anxiety 08/16/2022 N/A N/A Date Acquired: 5 N/A N/A [name] of Treatment: Open N/A N/A Wound Status: No N/A N/A Wound Recurrence: 1x0.7x0.3 N/A N/A Measurements L x W x D (cm) 0.55 N/A N/A A (cm) : rea 0.165 N/A N/A Volume (cm) : -100.00% N/A N/A %  Reduction in A rea: -50.00% N/A N/A % Reduction in Volume: Grade 3 N/A N/A Classification: Medium N/A N/A Exudate A mount: Serosanguineous N/A N/A Exudate Type: red, brown N/A N/A Exudate Color: Distinct, outline attached N/A N/A Wound Margin: Small (1-33%) N/A N/A Granulation A mount: Pink N/A N/A Granulation Quality: Large (67-100%) N/A N/A Necrotic A mount: Fat Layer (Subcutaneous Tissue): Yes N/A N/A Exposed Structures: Tendon: Yes Bone: Yes Fascia: No Muscle: No Joint: No Small (1-33%) N/A N/A Epithelialization: Debridement - Excisional N/A N/A Debridement: Pre-procedure Verification/Time Out 14:34 N/A N/A Taken: Lidocaine 5% topical ointment N/A N/A Pain Control: Subcutaneous, Slough N/A N/A Tissue Debrided: Skin/Subcutaneous Tissue N/A N/A Level: 0.7 N/A N/A Debridement A (sq cm): rea Curette N/A N/A Instrument: Minimum N/A N/A Bleeding: Pressure N/A N/A Hemostasis A chieved: 0 N/A N/A Procedural Pain: 0 N/A N/A Post Procedural Pain: Procedure was tolerated well N/A N/A Debridement Treatment Response: 1x0.7x0.3 N/A N/A Post Debridement Measurements L x W x D (cm) 0.165 N/A N/A Post Debridement Volume: (cm) Callus: Yes N/A  N/A Periwound Skin Texture: Maceration: Yes N/A N/A Periwound Skin Moisture: No Abnormalities Noted N/A N/A Periwound Skin Color: No Abnormality N/A N/A Temperature: Debridement N/A N/A Procedures Performed: Treatment Notes Electronic Signature(s) Signed: 11/16/2022 3:07:03 PM By: Fredirick Maudlin MD FACS Entered By: Fredirick Maudlin on 11/16/2022 15:07:03 -------------------------------------------------------------------------------- Multi-Disciplinary Care Plan Details Patient Name: Date of Service: [facility], [name] 11/16/2022 2:15 PM Medical Record Number: [id number] Patient Account Number: [id number] Date of Birth/Sex: Treating RN: 1951/01/15 (72 y.o. [name] Primary Care Olegario Emberson: [name] Other Clinician: Referring Tarah Buboltz: Treating Marwa Fuhrman/Extender: [name] in Treatment: [place]ita [place] reviewed with physician NATRELL, KOSS ([id number]) [id number] Page 4 of 8 Active Inactive Nutrition Nursing Diagnoses: Impaired glucose control: actual or potential Goals: Patient/caregiver will maintain therapeutic glucose control Date Initiated: 10/12/2022 Target Resolution Date: 12/28/2022 Goal Status: Active Interventions: Provide education on elevated blood sugars and impact on wound healing Provide education on nutrition Treatment Activities: Dietary management education, guidance and counseling : 10/12/2022 Giving encouragement to exercise : 10/12/2022 Notes: Osteomyelitis Nursing Diagnoses: Infection: osteomyelitis Goals: Patient/caregiver will verbalize understanding of disease process and disease management Date Initiated: 10/12/2022 Target Resolution Date: 12/28/2022 Goal Status: Active Interventions: Assess for signs and symptoms of osteomyelitis resolution every visit Provide education on osteomyelitis Treatment Activities: MRI : 08/16/2022 Notes: Wound/Skin Impairment Nursing  Diagnoses: Impaired tissue integrity Goals: Ulcer/skin breakdown will have a volume reduction of 30% by week 4 Date Initiated: 10/12/2022 Target Resolution Date: 12/28/2022 Goal Status: Active Interventions: Assess ulceration(s) every visit Provide education on smoking Treatment Activities: Skin care regimen initiated : 10/12/2022 Smoking cessation education : 10/12/2022 Notes: Electronic Signature(s) Signed: 11/16/2022 5:25:03 PM By: [name] Catholic RN Entered By: [name] on 11/16/2022 17:18:34 -------------------------------------------------------------------------------- Pain Assessment Details Patient Name: Date of Service: [facility], [name] 11/16/2022 2:15 PM Medical Record Number: [id number] Patient Account Number: [id number] [name], [name] ([id number]) [id number] Page 5 of 8 Date of Birth/Sex: Treating RN: Jan 07, 1951 (72 y.o. [name] Primary Care Lovena Kluck: Other Clinician: Thersa [name] Referring Laryssa Hassing: Treating [name]/Extender: [name] in Treatment: 5 Active Problems Location of Pain Severity and Description of Pain Patient Has Paino Yes Site Locations Pain Location: Generalized Pain With Dressing Change: No Duration of the Pain. Constant / Intermittento Constant Rate the pain. Current Pain Level: 3 Worst Pain Level: 10 Least Pain Level: 2 Tolerable Pain Level: 2 Character of Pain Describe the Pain: Difficult to Pinpoint Pain  Management and Medication Current Pain Management: Medication: Yes Cold Application: No Rest: Yes Massage: No Activity: No T.E.N.S.: No Heat Application: No Leg drop or elevation: No Is the Current Pain Management Adequate: Adequate How does your wound impact your activities of daily livingo Sleep: No Bathing: No Appetite: No Relationship With Others: No Bladder Continence: No Emotions: No Bowel Continence: No Work: No Toileting: No Drive: No Dressing:  No Hobbies: No Electronic Signature(s) Signed: 11/16/2022 5:25:03 PM By: [name] Catholic RN Entered By: [name] on 11/16/2022 14:44:53 -------------------------------------------------------------------------------- Patient/Caregiver Education Details Patient Name: Date of Service: [name], [name] 2/15/2024andnbsp2:15 PM Medical Record Number: [id number] Patient Account Number: [id number] Date of Birth/Gender: Treating RN: May 13, 1951 (72 y.o. [name] Primary Care Physician: [name] Other Clinician: Referring Physician: Treating Physician/Extender: Levin Erp in Treatment: 5 Education Assessment Education Provided To: Patient [name], [name] ([id number]) [id number] Page 6 of 8 Education Topics Provided Wound/Skin Impairment: Methods: Explain/Verbal Responses: Return demonstration correctly Electronic Signature(s) Signed: 11/16/2022 5:25:03 PM By: [name] Catholic RN Entered By: [name] on 11/16/2022 17:19:08 -------------------------------------------------------------------------------- Wound Assessment Details Patient Name: Date of Service: [name], [name] 11/16/2022 2:15 PM Medical Record Number: [id number] Patient Account Number: [id number] Date of Birth/Sex: Treating RN: 01-21-1951 (72 y.o. [name] Primary Care Jaylena Holloway: [name] Other Clinician: Referring Lori Popowski: Treating [name]/Extender: [name] in Treatment: 5 Wound Status Wound Number: 1 Primary Diabetic Wound/Ulcer of the Lower Extremity Etiology: Wound Location: Right, Medial T Great oe Secondary Acute Osteomyelitis Wounding Event: Pressure Injury Etiology: Date Acquired: 08/16/2022 Wound Open [name] Of Treatment: 5 Status: Clustered Wound: No Comorbid Cataracts, Hypertension, Type II Diabetes, Osteomyelitis, History: Neuropathy, Confinement Anxiety Photos Wound Measurements Length:  (cm) 1 Width: (cm) 0.7 Depth: (cm) 0.3 Area: (cm) 0.55 Volume: (cm) 0.165 % Reduction in Area: -100% % Reduction in Volume: -50% Epithelialization: Small (1-33%) Tunneling: No Undermining: No Wound Description Classification: Grade 3 Wound Margin: Distinct, outline attached Exudate Amount: Medium Exudate Type: Serosanguineous Exudate Color: red, brown Foul Odor After Cleansing: No Slough/Fibrino Yes Wound Bed Granulation Amount: Small (1-33%) Exposed Structure Granulation Quality: Pink Fascia Exposed: No Necrotic Amount: Large (67-100%) Fat Layer (Subcutaneous Tissue) Exposed: Yes Necrotic Quality: Adherent Slough Tendon Exposed: Yes Muscle Exposed: No Joint Exposed: No Bone Exposed: Yes [name], [name] ([id number].pdf Page 7 of 8 Periwound Skin Texture Texture Color No Abnormalities Noted: Yes No Abnormalities Noted: Yes Moisture Temperature / Pain No Abnormalities Noted: Yes Temperature: No Abnormality Treatment Notes Wound #1 (Toe Great) Wound Laterality: Right, Medial Cleanser Soap and Water Discharge Instruction: May shower and wash wound with dial antibacterial soap and water prior to dressing change. Wound Cleanser Discharge Instruction: Cleanse the wound with wound cleanser prior to applying a clean dressing using gauze sponges, not tissue or cotton balls. Peri-Wound Care Topical Primary Dressing Sorbalgon AG Dressing 2x2 (in/in) Discharge Instruction: Apply to wound bed as instructed Secondary Dressing Woven Gauze Sponge, Non-Sterile 4x4 in Discharge Instruction: Apply over primary dressing as directed. Secured With Conforming Stretch Gauze Bandage, Sterile 2x75 (in/in) Discharge Instruction: Secure with stretch gauze as directed. 60M Medipore Soft Cloth Surgical T 2x10 (in/yd) ape Discharge Instruction: Secure with tape as directed. Compression Wrap Compression Stockings Add-Ons Electronic Signature(s) Signed:  11/16/2022 5:25:03 PM By: [name] Catholic RN Entered By: [name] on 11/16/2022 14:36:52 -------------------------------------------------------------------------------- Vitals Details Patient Name: Date of Service: [name], [name] 11/16/2022 2:15 PM Medical Record Number: [id number] Patient Account Number: [id number] Date of Birth/Sex: Treating RN: 10-09-1950 (  72 y.o. [name] Primary Care Jayni Prescher: [name] Other Clinician: Referring Toshiro Hanken: Treating Tyrek Lawhorn/Extender: [name] in Treatment: 5 Vital Signs Time Taken: 14:36 Temperature (F): 98.6 Height (in): 72 Pulse (bpm): 85 Weight (lbs): 185 Respiratory Rate (breaths/min): 18 Body Mass Index (BMI): 25.1 Blood Pressure (mmHg): 133/60 Capillary Blood Glucose (mg/dl): 159 Reference Range: 80 - 120 mg / dl Electronic Signature(s) Signed: 11/16/2022 5:25:03 PM By: [name] Catholic RN Entered By: [name] on 11/16/2022 14:43:14 [name], [place] ([id number].pdf Page 8 of 8

## 2022-11-18 NOTE — Progress Notes (Signed)
Caliente, Janson ([id number]) [id number] Page 1 of 2 Visit Report for 11/17/2022 Problem List Details Patient Name: Date of Service: [name], [name] 11/17/2022 12:00 PM Medical Record Number: [id number] Patient Account Number: [id number] Date of Birth/Sex: Treating RN: 12-10-1950 (72 y.o. 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 E11.621 Type 2 diabetes mellitus with foot ulcer 10/12/2022 No Yes N18.5 Chronic kidney disease, stage 5 10/12/2022 No Yes M86.9 Osteomyelitis, unspecified 10/12/2022 No Yes E11.42 Type 2 diabetes mellitus with diabetic polyneuropathy 10/12/2022 No Yes I10 Essential (primary) hypertension 10/12/2022 No Yes Inactive Problems Resolved Problems Electronic Signature(s) Signed: 11/17/2022 1:55:30 PM By: Valeria Batman EMT Signed: 11/17/2022 2:14:17 PM By: Fredirick Maudlin MD FACS Entered By: Valeria Batman on 11/17/2022 13:55:30 -------------------------------------------------------------------------------- SuperBill Details Patient Name: Date of Service: [name], [name] 11/17/2022 Medical Record Number: [id number] Patient Account Number: [id number] Date of Birth/Sex: Treating RN: 11-18-1950 (72 y.o. [name] Primary Care Provider: [name] Other Clinician: Valeria Batman Referring Provider: Treating Provider/Extender: [name] Weeks in Treatment: 5 [name], [name] [id number] Page 2 of 2 Diagnosis Coding ICD-10 Codes Code Description L97.514 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.621 Type 2 diabetes  mellitus with foot ulcer N18.5 Chronic kidney disease, stage 5 M86.9 Osteomyelitis, unspecified E11.42 Type 2 diabetes mellitus with diabetic polyneuropathy I10 Essential (primary) hypertension Facility Procedures : CPT4 Code Description: [id number]-(Facility Use Only) HBOT full body chamber, 9mn , ICD-10 Diagnosis Description M86.9 Osteomyelitis, unspecified E11.621 Type 2 diabetes mellitus with foot ulcer L97.514 Non-pressure chronic ulcer of other part of  right foot wi Modifier: th necrosis Quantity: 4 of bone Physician Procedures : CPT4 Code Description Modifier [id number] PHYS HYPERBARIC OXYGEN THERAPY ICD-10 Diagnosis Description M86.9 Osteomyelitis, unspecified E11.621 Type 2 diabetes mellitus with foot ulcer L97.514 Non-pressure chronic ulcer of other part of right  foot with necrosis o Quantity: 1 f bone Electronic Signature(s) Signed: 11/17/2022 1:55:24 PM By: GValeria BatmanEMT Signed: 11/17/2022 2:14:17 PM By: CFredirick MaudlinMD FACS Entered By: GValeria Batmanon 11/17/2022 13:55:23

## 2022-11-18 NOTE — Progress Notes (Signed)
[name], [name] ([id number]) [id number] Page 1 of 2 Visit Report for 11/17/2022 HBO Details Patient Name: Date of Service: [name], [name] 11/17/2022 12:00 PM Medical Record Number: [id number] Patient Account Number: [id number] Date of Birth/Sex: Treating RN: August 20, 1951 (72 y.o. 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 HBO Treatment Details Treatment Number: 12 Patient Type: Outpatient Chamber Type: Monoplace Chamber Serial #: [id number] Treatment Protocol: 2.0 ATA with 90 minutes oxygen, and no air breaks Treatment Details Compression Rate Down: 2.0 psi / minute De-Compression Rate Up: 2.0 psi / minute Air breaks and breathing Decompress Decompress Compress Tx Pressure Begins Reached periods Begins Ends (leave unused spaces blank) Chamber Pressure (ATA 1 2 ------2 1 ) Clock Time (24 hr) 11:48 11:59 - - - - - - 13:29 13:38 Treatment Length: 110 (minutes) Treatment Segments: 4 Vital Signs Capillary Blood Glucose Reference Range: 80 - 120 mg / dl HBO Diabetic Blood Glucose Intervention Range: <131 mg/dl or >249 mg/dl Time Vitals Blood Respiratory Capillary Blood Glucose Pulse Action Type: Pulse: Temperature: Taken: Pressure: Rate: Glucose (mg/dl): Meter #: Oximetry (%) Taken: Pre 11:29 146 Post 13:43 149/69 84 18 97.2 223 Pre 11:45 164/73 90 18 98.5 Treatment Response Treatment Toleration: Well Treatment Completion Status: Treatment Completed without Adverse Event Treatment Notes The patient stated that he had just had lunch before coming in for treatment at  [place]. Physician HBO Attestation: I certify that I supervised this HBO treatment in accordance with Medicare guidelines. A trained emergency response team is readily available per Yes hospital policies and procedures. Continue HBOT as ordered. Yes Electronic Signature(s) Signed: 11/17/2022 3:21:38 PM By: Fredirick Maudlin MD FACS Previous Signature: 11/17/2022 2:00:16 PM Version By: Valeria Batman EMT Previous Signature: 11/17/2022 1:53:55 PM Version By: Valeria Batman EMT Entered By: Fredirick Maudlin on 11/17/2022 15:21:38 Dolin, Jamail ([id number] Page 2 of 2 -------------------------------------------------------------------------------- HBO Safety Checklist Details Patient Name: Date of Service: [name], [name] 11/17/2022 12:00 PM Medical Record Number: [id number] Patient Account Number: [id number] Date of Birth/Sex: Treating RN: [birth year] (72 y.o. [name] Primary Care Kaytelyn Glore: [name] Other Clinician: Valeria Batman Referring Mansel Strother: Treating Kimmarie Pascale/Extender: [name] Weeks in Treatment: 5 HBO Safety Checklist Items Safety Checklist Consent Form Signed Patient voided / foley secured and emptied When did you last eato 1115 Last dose of injectable or oral agent 1120 Ostomy pouch emptied and vented if applicable NA All implantable devices assessed, documented and approved Libre2 Dialysis shunt Intravenous access site secured and place NA Valuables secured Linens and cotton and cotton/polyester blend (less than 51% polyester) Personal oil-based products / skin lotions / body lotions removed Wigs or hairpieces removed NA Smoking or tobacco materials removed Books / newspapers / magazines / loose paper removed Cologne, aftershave, perfume and deodorant removed Jewelry removed (may wrap wedding band) Make-up removed NA Hair care products removed Battery operated devices (external) removed Heating  patches and chemical warmers removed Titanium eyewear removed NA Nail polish cured greater than 10 hours NA Casting material cured greater than 10 hours NA Hearing aids removed NA Loose dentures or partials removed removed by patient Prosthetics have been removed NA Patient demonstrates correct use of air break device (if applicable) Patient concerns have been addressed  Patient grounding bracelet on and cord attached to chamber Specifics for Inpatients (complete in addition to above) Medication sheet sent with patient NA Intravenous medications needed or due during therapy sent with patient NA Drainage tubes (e.g. nasogastric tube or chest tube secured and vented) NA Endotracheal or Tracheotomy tube secured NA Cuff deflated of air and inflated with saline NA Airway suctioned NA Notes The safety checklist was done before the treatment started. Electronic Signature(s) Signed: 11/17/2022 1:52:01 PM By: Valeria Batman EMT Entered By: Valeria Batman on 11/17/2022 13:52:01

## 2022-11-18 NOTE — Progress Notes (Signed)
[place], [name] ([id number]) [id number] Page 1 of 9 Visit Report for 11/16/2022 Chief Complaint Document Details Patient Name: Date of Service: [name], [name] 11/16/2022 2:15 PM Medical Record Number: [id number] Patient Account Number: [id number] Date of Birth/Sex: Treating RN: 1950/12/29 (72 y.o. M) Primary Care Provider: Thersa [name] Other Clinician: Referring Provider: Treating Provider/Extender: [name] Weeks in Treatment: 5 Information Obtained from: Patient Chief Complaint Patients presents for treatment of an open diabetic ulcer on the right great toe Electronic Signature(s) Signed: 11/16/2022 3:07:09 PM By: [name] Maudlin MD FACS Entered By: [name] on 11/16/2022 15:07:08 -------------------------------------------------------------------------------- Debridement Details Patient Name: Date of Service: [name], [name] 11/16/2022 2:15 PM Medical Record Number: [id number] Patient Account Number: [id number] Date of Birth/Sex: Treating RN: May 22, 1951 (72 y.o. [name] Primary Care Provider: Thersa [name] Other Clinician: Referring Provider: Treating Provider/Extender: [name] Weeks in Treatment: 5 Debridement Performed for Assessment: Wound #1 Right,Medial T Great oe Performed By: Physician [name] Maudlin, MD Debridement Type: Debridement Severity of Tissue Pre Debridement: Fat layer exposed Level of Consciousness (Pre-procedure): Awake and Alert Pre-procedure Verification/Time Out Yes - 14:34 Taken: Start Time: 14:34 Pain Control: Lidocaine 5% topical ointment T Area Debrided (L x W): otal 1 (cm) x 0.7 (cm) = 0.7 (cm) Tissue and other material debrided: Non-Viable, Slough, Subcutaneous, Slough Level: Skin/Subcutaneous Tissue Debridement Description: Excisional Instrument: Curette Bleeding: Minimum Hemostasis Achieved: Pressure End Time: 14:35 Procedural Pain: 0 Post Procedural  Pain: 0 Response to Treatment: Procedure was tolerated well Level of Consciousness (Post- Awake and Alert procedure): Post Debridement Measurements of Total Wound Length: (cm) 1 Width: (cm) 0.7 Depth: (cm) 0.3 Volume: (cm) 0.165 Character of Wound/Ulcer Post Debridement: Improved Severity of Tissue Post Debridement: Fat layer exposed [name], [name] ([id number] Page 2 of 9 Post Procedure Diagnosis Same as Pre-procedure Notes Scribed for Dr. Celine [name] by [name] Electronic Signature(s) Signed: 11/16/2022 3:44:25 PM By: [name] Maudlin MD FACS Signed: 11/16/2022 5:25:03 PM By: [name] Catholic RN Entered By: [name] on 11/16/2022 14:46:57 -------------------------------------------------------------------------------- HPI Details Patient Name: Date of Service: [name], [name] 11/16/2022 2:15 PM Medical Record Number: [id number] Patient Account Number: [id number] Date of Birth/Sex: Treating RN: [birth year] (72 y.o. M) Primary Care Provider: Thersa [name] Other Clinician: Referring Provider: Treating Provider/Extender: [name] Weeks in Treatment: 5 History of Present Illness HPI Description: ADMISSION 10/12/2022 This is a 72 year old type II diabetic (last hemoglobin A1c 7.7 in October 2023) with stage V chronic kidney disease and hypertension. He has been followed by podiatry for an ulcer on his right great toe. He has a history of prior toe and foot infections requiring amputation. In November 2023, he had an outpatient MRI that demonstrated osteomyelitis. He was admitted to the hospital for IV antibiotics and subsequently underwent irrigation and debridement of the toe with biopsy and placement of married 3 layer matrix. Cultures grew out MRSA and osteomyelitis was seen on pathology. He was followed by infectious disease and given a course of doxycycline and Augmentin. He completed this course at the end of December. He  has been evaluated by vascular [name] with formal ABIs as well as consultation. He does have adequate blood flow at least to the dorsalis pedis. He last saw podiatry on January 4 of this year where he underwent debridement. It was felt he would benefit from specialized wound care and he was referred to our center for further evaluation and management. On the dorsal aspect of his right great toe, there is a  circular ulcer. There is a fair amount of undermining from callus and dry skin. The surface is a bit pale and fibrotic, but there is no obvious necrosis. 10/19/2022: He has been approved by his insurance to undergo hyperbaric oxygen therapy. EKG has been obtained and is normal. We are still waiting on his chest x-ray. The wound is clean with just a little bit of periwound callus and slough on the surface. Tendon is exposed. 10/26/2022: Unfortunately, the required co-pay from his insurance is cost prohibitive for him and he will not be able to receive hyperbaric oxygen therapy. Some moisture got under the dry skin around his wound and lifted it off but there is good epithelialized tissue beneath this. The exposed tendon has deteriorated. 11/03/2022: After some discussion with the financial office, it has become feasible for the patient to undergo hyperbaric oxygen therapy and he has received his first treatments this week. The wound is a little bit smaller and the tissue has a better color to it, but there is still extensive nonviable subcutaneous tissue and slough present. 11/09/2022: The wound measured a little bit smaller today. There is still slough and nonviable subcu tissue and tendon present. 11/16/2022: The wound continues to contract. There is still substantial depth to it, however, and bone remains exposed at the base. This is also a (early) 30-day update for HBO. Electronic Signature(s) Signed: 11/16/2022 3:07:59 PM By: [name] Maudlin MD FACS Entered By: [name] on 11/16/2022  15:07:58 -------------------------------------------------------------------------------- Physical Exam Details Patient Name: Date of Service: [name], [name] 11/16/2022 2:15 PM Medical Record Number: [name] Patient Account Number: [id number] Date of Birth/Sex: Treating RN: 06-16-1951 (72 y.o. M) Primary Care Provider: Thersa [name] Other Clinician: Referring Provider: Treating Provider/Extender: [name] [place], [name] ([name]) [id number] Page 3 of 9 Weeks in Treatment: 5 Constitutional . . . . no acute distress. Ears, Nose, Mouth, and Throat Heavy cerumen, but tympanic membranes visible and clear. Respiratory Normal work of breathing on room air. Clear to auscultation bilaterally. Cardiovascular . Notes 11/16/2022: The wound continues to contract. There is still substantial depth to it, however, and bone remains exposed at the base. Electronic Signature(s) Signed: 11/16/2022 3:10:43 PM By: [name] Maudlin MD FACS Previous Signature: 11/16/2022 3:08:33 PM Version By: [name] Maudlin MD FACS Entered By: [name] on 11/16/2022 15:10:43 -------------------------------------------------------------------------------- Physician Orders Details Patient Name: Date of Service: [name], [name] 11/16/2022 2:15 PM Medical Record Number: [name] Patient Account Number: [id number] Date of Birth/Sex: Treating RN: 09/26/1951 (72 y.o. [name] Primary Care Provider: Thersa [name] Other Clinician: Referring Provider: Treating Provider/Extender: [name] Weeks in Treatment: 5 Verbal / Phone Orders: No Diagnosis Coding Follow-up Appointments ppointment in 1 week. - Dr. Celine [name] Rm 1 Return A Anesthetic Wound #1 Right,Medial T Great oe (In clinic) Topical Lidocaine 4% applied to wound bed Bathing/ Shower/ Hygiene May shower and wash wound with soap and water. Off-Loading Wound #1 Right,Medial T  Great oe Other: - Diabetic shoe Hyperbaric Oxygen Therapy Other - HBO eval (11/16/22) Wound Treatment Wound #1 - T Great oe Wound Laterality: Right, Medial Cleanser: Soap and Water 1 x Per Day/30 Days Discharge Instructions: May shower and wash wound with dial antibacterial soap and water prior to dressing change. Cleanser: Wound Cleanser 1 x Per Day/30 Days Discharge Instructions: Cleanse the wound with wound cleanser prior to applying a clean dressing using gauze sponges, not tissue or cotton balls. Prim Dressing: Sorbalgon AG Dressing 2x2 (in/in) 1 x Per Day/30 Days  ary Discharge Instructions: Apply to wound bed as instructed Secondary Dressing: Woven Gauze Sponge, Non-Sterile 4x4 in 1 x Per Day/30 Days Discharge Instructions: Apply over primary dressing as directed. Secured With: Child psychotherapist, Sterile 2x75 (in/in) 1 x Per Day/30 Days [employer], Armanie ([id number]) [phone].pdf Page 4 of 9 Discharge Instructions: Secure with stretch gauze as directed. Secured With: 22M Medipore Public affairs consultant Surgical T 2x10 (in/yd) 1 x Per Day/30 Days ape Discharge Instructions: Secure with tape as directed. Electronic Signature(s) Signed: 11/16/2022 3:44:25 PM By: [name] Maudlin MD FACS Signed: 11/16/2022 5:25:03 PM By: [name] Catholic RN Entered By: [name] on 11/16/2022 14:47:59 -------------------------------------------------------------------------------- Problem List Details Patient Name: Date of Service: [name], [name] 11/16/2022 2:15 PM Medical Record Number: [id number] Patient Account Number: [id number] Date of Birth/Sex: Treating RN: 1951-01-30 (72 y.o. M) Primary Care Provider: Thersa [name] Other Clinician: Referring Provider: Treating Provider/Extender: [name] Weeks in Treatment: 5 Active Problems ICD-10 Encounter Code Description Active Date MDM Diagnosis L97.514 Non-pressure chronic ulcer of other part of  right foot with necrosis of bone 10/12/2022 No Yes E11.621 Type 2 diabetes mellitus with foot ulcer 10/12/2022 No Yes N18.5 Chronic kidney disease, stage 5 10/12/2022 No Yes M86.9 Osteomyelitis, unspecified 10/12/2022 No Yes E11.42 Type 2 diabetes mellitus with diabetic polyneuropathy 10/12/2022 No Yes I10 Essential (primary) hypertension 10/12/2022 No Yes Inactive Problems Resolved Problems Electronic Signature(s) Signed: 11/16/2022 3:06:57 PM By: [name] Maudlin MD FACS Entered By: [name] on 11/16/2022 15:06:57 [place], [place] ([id number] Page 5 of 9 -------------------------------------------------------------------------------- Progress Note Details Patient Name: Date of ServiceJodene [name], [name] 11/16/2022 2:15 PM Medical Record Number: [id number] Patient Account Number: [id number] Date of Birth/Sex: Treating RN: Feb 17, 1951 (72 y.o. M) Primary Care Provider: Thersa [name] Other Clinician: Referring Provider: Treating Provider/Extender: [name] Weeks in Treatment: 5 Subjective Chief Complaint Information obtained from Patient Patients presents for treatment of an open diabetic ulcer on the right great toe History of Present Illness (HPI) ADMISSION 10/12/2022 This is a 72 year old type II diabetic (last hemoglobin A1c 7.7 in October 2023) with stage V chronic kidney disease and hypertension. He has been followed by podiatry for an ulcer on his right great toe. He has a history of prior toe and foot infections requiring amputation. In November 2023, he had an outpatient MRI that demonstrated osteomyelitis. He was admitted to the hospital for IV antibiotics and subsequently underwent irrigation and debridement of the toe with biopsy and placement of married 3 layer matrix. Cultures grew out MRSA and osteomyelitis was seen on pathology. He was followed by infectious disease and given a course of doxycycline and Augmentin. He  completed this course at the end of December. He has been evaluated by vascular [name] with formal ABIs as well as consultation. He does have adequate blood flow at least to the dorsalis pedis. He last saw podiatry on January 4 of this year where he underwent debridement. It was felt he would benefit from specialized wound care and he was referred to our center for further evaluation and management. On the dorsal aspect of his right great toe, there is a circular ulcer. There is a fair amount of undermining from callus and dry skin. The surface is a bit pale and fibrotic, but there is no obvious necrosis. 10/19/2022: He has been approved by his insurance to undergo hyperbaric oxygen therapy. EKG has been obtained and is normal. We are still waiting on his chest x-ray. The wound is clean with just a  little bit of periwound callus and slough on the surface. Tendon is exposed. 10/26/2022: Unfortunately, the required co-pay from his insurance is cost prohibitive for him and he will not be able to receive hyperbaric oxygen therapy. Some moisture got under the dry skin around his wound and lifted it off but there is good epithelialized tissue beneath this. The exposed tendon has deteriorated. 11/03/2022: After some discussion with the financial office, it has become feasible for the patient to undergo hyperbaric oxygen therapy and he has received his first treatments this week. The wound is a little bit smaller and the tissue has a better color to it, but there is still extensive nonviable subcutaneous tissue and slough present. 11/09/2022: The wound measured a little bit smaller today. There is still slough and nonviable subcu tissue and tendon present. 11/16/2022: The wound continues to contract. There is still substantial depth to it, however, and bone remains exposed at the base. This is also a (early) 30-day update for HBO. Patient History Information obtained from Patient. Family History Diabetes -  Mother,Siblings, Heart Disease - Siblings, Hypertension - Siblings, Seizures - Father, Stroke - Siblings, No family history of Cancer, Hereditary Spherocytosis, Kidney Disease, Lung Disease, Thyroid Problems, Tuberculosis. Social History Former smoker, Marital Status - Married, Alcohol Use - Rarely, Drug Use - No History, Caffeine Use - Daily. Medical History Eyes Patient has history of Cataracts - rt eye Ear/Nose/Mouth/Throat Denies history of Chronic sinus problems/congestion Hematologic/Lymphatic Denies history of Anemia, Hemophilia, Human Immunodeficiency Virus, Lymphedema, Sickle Cell Disease Respiratory Denies history of Aspiration, Asthma, Chronic Obstructive Pulmonary Disease (COPD), Pneumothorax, Tuberculosis Cardiovascular Patient has history of Hypertension Gastrointestinal Denies history of Cirrhosis , Colitis, Crohnoos, Hepatitis A, Hepatitis B, Hepatitis C Endocrine Patient has history of Type II Diabetes Genitourinary Denies history of End Stage Renal Disease Immunological Denies history of Lupus Erythematosus, Raynaudoos, Scleroderma Integumentary (Skin) Denies history of History of Burn Musculoskeletal Patient has history of Osteomyelitis - MRI confirmed (08/16/22) Denies history of Gout Neurologic Patient has history of Neuropathy [place], [place] ([id number] Page 6 of 9 Psychiatric Patient has history of Confinement Anxiety Denies history of Anorexia/bulimia Hospitalization/[name] History - AV fistula place left upper arm 10/20/22. Medical A Surgical History Notes nd Genitourinary CKD IV Objective Constitutional no acute distress. Vitals Time Taken: 2:36 PM, Height: 72 in, Weight: 185 lbs, BMI: 25.1, Temperature: 98.6 F, Pulse: 85 bpm, Respiratory Rate: 18 breaths/min, Blood Pressure: 133/60 mmHg, Capillary Blood Glucose: 159 mg/dl. Ears, Nose, Mouth, and Throat Heavy cerumen, but tympanic membranes visible and  clear. Respiratory Normal work of breathing on room air. Clear to auscultation bilaterally. General Notes: 11/16/2022: The wound continues to contract. There is still substantial depth to it, however, and bone remains exposed at the base. Integumentary (Hair, Skin) Wound #1 status is Open. Original cause of wound was Pressure Injury. The date acquired was: 08/16/2022. The wound has been in treatment 5 weeks. The wound is located on the Right,Medial T Great. The wound measures 1cm length x 0.7cm width x 0.3cm depth; 0.55cm^2 area and 0.165cm^3 volume. There is oe bone, tendon, and Fat Layer (Subcutaneous Tissue) exposed. There is no tunneling or undermining noted. There is a medium amount of serosanguineous drainage noted. The wound margin is distinct with the outline attached to the wound base. There is small (1-33%) pink granulation within the wound bed. There is a large (67-100%) amount of necrotic tissue within the wound bed including Adherent Slough. The periwound skin appearance had no abnormalities noted for texture. The  periwound skin appearance had no abnormalities noted for moisture. The periwound skin appearance had no abnormalities noted for color. Periwound temperature was noted as No Abnormality. Assessment Active Problems ICD-10 Non-pressure chronic ulcer of other 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 Type 2 diabetes mellitus with foot ulcer Chronic kidney disease, stage 5 Osteomyelitis, unspecified Type 2 diabetes mellitus with diabetic polyneuropathy Essential (primary) hypertension Procedures Wound #1 Pre-procedure diagnosis of Wound #1 is a Diabetic Wound/Ulcer of the Lower Extremity located on the Right,Medial T Great .Severity of Tissue Pre oe Debridement is: Fat layer exposed. There was a Excisional Skin/Subcutaneous Tissue Debridement with a total area of 0.7 sq cm performed by [name] Maudlin, MD. With the following instrument(s): Curette to remove Non-Viable  tissue/material. Material removed includes Subcutaneous Tissue and Slough and after achieving pain control using Lidocaine 5% topical ointment. No specimens were taken. A time out was conducted at 14:34, prior to the start of the procedure. A Minimum amount of bleeding was controlled with Pressure. The procedure was tolerated well with a pain level of 0 throughout and a pain level of 0 following the procedure. Post Debridement Measurements: 1cm length x 0.7cm width x 0.3cm depth; 0.165cm^3 volume. Character of Wound/Ulcer Post Debridement is improved. Severity of Tissue Post Debridement is: Fat layer exposed. Post procedure Diagnosis Wound #1: Same as Pre-Procedure General Notes: Scribed for Dr. Celine [name] by [name]. Plan Follow-up Appointments: Return Appointment in 1 week. - Dr. Alvera Singh 1 [place], [name] ([id number]) [id number] Page 7 of 9 Anesthetic: Wound #1 Right,Medial T Great: oe (In clinic) Topical Lidocaine 4% applied to wound bed Bathing/ Shower/ Hygiene: May shower and wash wound with soap and water. Off-Loading: Wound #1 Right,Medial T Great: oe Other: - Diabetic shoe Hyperbaric Oxygen Therapy: Other - HBO eval (11/16/22) WOUND #1: - T Great Wound Laterality: Right, Medial oe Cleanser: Soap and Water 1 x Per Day/30 Days Discharge Instructions: May shower and wash wound with dial antibacterial soap and water prior to dressing change. Cleanser: Wound Cleanser 1 x Per Day/30 Days Discharge Instructions: Cleanse the wound with wound cleanser prior to applying a clean dressing using gauze sponges, not tissue or cotton balls. Prim Dressing: Sorbalgon AG Dressing 2x2 (in/in) 1 x Per Day/30 Days ary Discharge Instructions: Apply to wound bed as instructed Secondary Dressing: Woven Gauze Sponge, Non-Sterile 4x4 in 1 x Per Day/30 Days Discharge Instructions: Apply over primary dressing as directed. Secured With: Child psychotherapist, Sterile  2x75 (in/in) 1 x Per Day/30 Days Discharge Instructions: Secure with stretch gauze as directed. Secured With: 55M Medipore Public affairs consultant Surgical T 2x10 (in/yd) 1 x Per Day/30 Days ape Discharge Instructions: Secure with tape as directed. 11/16/2022: The wound continues to contract. There is still substantial depth to it, however, and bone remains exposed at the base. I used a curette to debride slough and nonviable subcutaneous tissue from the wound. We will pack the cavity with silver alginate. He will continue his hyperbaric oxygen therapy. Follow-up in 1 week. Electronic Signature(s) Signed: 11/16/2022 3:11:13 PM By: [name] Maudlin MD FACS Previous Signature: 11/16/2022 3:10:13 PM Version By: [name] Maudlin MD FACS Entered By: [name] on 11/16/2022 15:11:12 -------------------------------------------------------------------------------- HxROS Details Patient Name: Date of Service: [name], [name] 11/16/2022 2:15 PM Medical Record Number: [id number] Patient Account Number: [id number] Date of Birth/Sex: Treating RN: 04-Jun-1951 (72 y.o. M) Primary Care Provider: Thersa [name] Other Clinician: Referring Provider: Treating Provider/Extender: [name] Weeks in Treatment: 5 Information Obtained From Patient Eyes  Medical History: Positive for: Cataracts - rt eye Ear/Nose/Mouth/Throat Medical History: Negative for: Chronic sinus problems/congestion Hematologic/Lymphatic Medical History: Negative for: Anemia; Hemophilia; Human Immunodeficiency Virus; Lymphedema; Sickle Cell Disease Respiratory Medical History: Negative for: Aspiration; Asthma; Chronic Obstructive Pulmonary Disease (COPD); Pneumothorax; Tuberculosis Cardiovascular Medical HistoryGARDINER, SYDOW ([name]) [id number] Page 8 of 9 Positive for: Hypertension Gastrointestinal Medical History: Negative for: Cirrhosis ; Colitis; Crohns; Hepatitis A; Hepatitis B;  Hepatitis C Endocrine Medical History: Positive for: Type II Diabetes Time with diabetes: 2000 Treated with: Insulin Blood sugar tested every day: Yes Tested : Genitourinary Medical History: Negative for: End Stage Renal Disease Past Medical History Notes: CKD IV Immunological Medical History: Negative for: Lupus Erythematosus; Raynauds; Scleroderma Integumentary (Skin) Medical History: Negative for: History of Burn Musculoskeletal Medical History: Positive for: Osteomyelitis - MRI confirmed (08/16/22) Negative for: Gout Neurologic Medical History: Positive for: Neuropathy Psychiatric Medical History: Positive for: Confinement Anxiety Negative for: Anorexia/bulimia HBO Extended History Items Eyes: Cataracts Immunizations Pneumococcal Vaccine: Received Pneumococcal Vaccination: Yes Received Pneumococcal Vaccination On or After 60th Birthday: Yes Implantable Devices None Hospitalization / [name] History Type of Hospitalization/[name] AV fistula place left upper arm 10/20/22 Family and Social History Cancer: No; Diabetes: Yes - Mother,Siblings; Heart Disease: Yes - Siblings; Hereditary Spherocytosis: No; Hypertension: Yes - Siblings; Kidney Disease: No; Lung Disease: No; Seizures: Yes - Father; Stroke: Yes - Siblings; Thyroid Problems: No; Tuberculosis: No; Former smoker; Marital Status - Married; Alcohol Use: Rarely; Drug Use: No History; Caffeine Use: Daily; Financial Concerns: No; Food, Clothing or Shelter Needs: No; Support System Lacking: No; Transportation Concerns: No Electronic Signature(s) Signed: 11/16/2022 3:44:25 PM By: [name] Maudlin MD FACS Entered By: [name] on 11/16/2022 15:08:06 Koestner, [name] ([id number] Page 9 of 9 -------------------------------------------------------------------------------- SuperBill Details Patient Name: Date of ServiceJodene [name], [name] 11/16/2022 Medical Record Number:  [name] Patient Account Number: [id number] Date of Birth/Sex: Treating RN: 1950/12/28 (72 y.o. M) Primary Care Provider: Thersa [name] Other Clinician: Referring Provider: Treating Provider/Extender: [name] Weeks in Treatment: 5 Diagnosis Coding ICD-10 Codes Code Description [phone] 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.621 Type 2 diabetes mellitus with foot ulcer N18.5 Chronic kidney disease, stage 5 M86.9 Osteomyelitis, unspecified E11.42 Type 2 diabetes mellitus with diabetic polyneuropathy I10 Essential (primary) hypertension Facility Procedures : CPT4 Code: [id number] Description: [id number] - DEB SUBQ TISSUE 20 SQ CM/< ICD-10 Diagnosis Description L97.514 Non-pressure chronic ulcer of other 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 Modifier: Quantity: 1 Physician Procedures : CPT4 Code Description Modifier [id number] - WC PHYS LEVEL 4 - EST PT 25 ICD-10 Diagnosis Description L97.514 Non-pressure chronic ulcer of other 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 M86.9 Osteomyelitis, unspecified E11.621 Type 2 diabetes  mellitus with foot ulcer N18.5 Chronic kidney disease, stage 5 Quantity: 1 : [id number] - WC PHYS SUBQ TISS 20 SQ CM ICD-10 Diagnosis Description L97.514 Non-pressure chronic ulcer of other 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 Quantity: 1 Electronic Signature(s) Signed: 11/16/2022 3:11:38 PM By: [name] Maudlin MD FACS Entered By: [name] on 11/16/2022 15:11:37

## 2022-11-20 ENCOUNTER — Encounter (HOSPITAL_BASED_OUTPATIENT_CLINIC_OR_DEPARTMENT_OTHER): Payer: No Typology Code available for payment source | Admitting: General Surgery

## 2022-11-20 DIAGNOSIS — M86671 Other chronic osteomyelitis, right ankle and foot: Secondary | ICD-10-CM | POA: Diagnosis not present

## 2022-11-20 DIAGNOSIS — E11621 Type 2 diabetes mellitus with foot ulcer: Secondary | ICD-10-CM | POA: Diagnosis not present

## 2022-11-20 LAB — GLUCOSE, CAPILLARY
Glucose-Capillary: 216 mg/dL — ABNORMAL HIGH (ref 70–99)
Glucose-Capillary: 270 mg/dL — ABNORMAL HIGH (ref 70–99)

## 2022-11-21 ENCOUNTER — Encounter (HOSPITAL_BASED_OUTPATIENT_CLINIC_OR_DEPARTMENT_OTHER): Payer: No Typology Code available for payment source | Admitting: General Surgery

## 2022-11-21 DIAGNOSIS — M86671 Other chronic osteomyelitis, right ankle and foot: Secondary | ICD-10-CM | POA: Diagnosis not present

## 2022-11-21 DIAGNOSIS — E11621 Type 2 diabetes mellitus with foot ulcer: Secondary | ICD-10-CM | POA: Diagnosis not present

## 2022-11-21 LAB — GLUCOSE, CAPILLARY
Glucose-Capillary: 205 mg/dL — ABNORMAL HIGH (ref 70–99)
Glucose-Capillary: 273 mg/dL — ABNORMAL HIGH (ref 70–99)

## 2022-11-21 NOTE — Progress Notes (Signed)
[place], [place] ([id number]) [id number] Page 1 of 2 Visit Report for 11/20/2022 Problem List Details Patient Name: Date of Service: [name], [name] 11/20/2022 12:00 PM Medical Record Number: [id number] Patient Account Number: [id number] Date of Birth/Sex: Treating RN: 10/11/1950 (72 y.o. [name] Primary Care Provider: [name] Other Clinician: Valeria Batman Referring Provider: Treating Provider/Extender: [name] Weeks in Treatment: 5 Active Problems ICD-10 Encounter Code Description Active Date MDM Diagnosis L97.514 Non-pressure chronic ulcer of other part of right foot with 10/12/2022 No Yes necrosis of bone E11.621 Type 2 diabetes mellitus with foot ulcer 10/12/2022 No Yes N18.5 Chronic kidney disease, stage 5 10/12/2022 No Yes M86.9 Osteomyelitis, unspecified 10/12/2022 No Yes E11.42 Type 2 diabetes mellitus with diabetic polyneuropathy 10/12/2022 No Yes I10 Essential (primary) hypertension 10/12/2022 No Yes Inactive Problems Resolved Problems Electronic Signature(s) Signed: 11/20/2022 2:52:11 PM By: Valeria Batman EMT Signed: 11/20/2022 5:01:55 PM By: Fredirick Maudlin MD FACS Entered By: Valeria Batman on 11/20/2022 14:52:11 -------------------------------------------------------------------------------- SuperBill Details Patient Name: Date of Service: [facility] [name], [name] 11/20/2022 Medical Record Number: [id number] Patient Account Number: [id number] Date of Birth/Sex: Treating RN: 06/27/1951 (72 y.o. [name] Primary Care Provider: [name] Other Clinician: Valeria Batman Referring Provider: Treating Provider/Extender: [name] Weeks in Treatment: 5 [place], [name] ([id number]) [id number] Page 2 of 2 Diagnosis Coding ICD-10 Codes Code Description L97.514 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.621 Type 2 diabetes mellitus  with foot ulcer N18.5 Chronic kidney disease, stage 5 M86.9 Osteomyelitis, unspecified E11.42 Type 2 diabetes mellitus with diabetic polyneuropathy I10 Essential (primary) hypertension Facility Procedures : CPT4 Code Description: [id number] G0277-(Facility Use Only) HBOT full body chamber, 37mn , ICD-10 Diagnosis Description M86.9 Osteomyelitis, unspecified L97.514 Non-pressure chronic ulcer of other part of right foot wi E11.42 Type 2 diabetes mellitus with  diabetic polyneuropathy E11.621 Type 2 diabetes mellitus with foot ulcer Modifier: th necrosis Quantity: 4 of bone Physician Procedures : CPT4 Code Description Modifier [id number] PHYS HYPERBARIC OXYGEN THERAPY ICD-10 Diagnosis Description M86.9 Osteomyelitis, unspecified L97.514 Non-pressure chronic ulcer of other part of right foot with necrosis o E11.42 Type 2 diabetes  mellitus with diabetic polyneuropathy E11.621 Type 2 diabetes mellitus with foot ulcer Quantity: 1 f bone Electronic Signature(s) Signed: 11/20/2022 2:52:06 PM By: GValeria BatmanEMT Signed: 11/20/2022 5:01:55 PM By: CFredirick MaudlinMD FACS Entered By: GValeria Batmanon 11/20/2022 14:52:05

## 2022-11-21 NOTE — Progress Notes (Signed)
[place], Jeron ([id number]) [id number] Page 1 of 2 Visit Report for 11/20/2022 Arrival Information Details Patient Name: Date of Service: [place], [name] 11/20/2022 12:00 PM Medical Record Number: [id number] Patient Account Number: [id number] Date of Birth/Sex: Treating RN: 02/08/1951 (72 y.o. [name], [name] Primary Care Rekita Miotke: [name] Other Clinician: Valeria Batman Referring Lennox Leikam: Treating Arzell Mcgeehan/Extender: [name] Weeks in Treatment: 5 Visit Information History Since Last Visit All ordered tests and consults were completed: Yes Patient Arrived: Ambulatory Added or deleted any medications: No Arrival Time: 11:34 Any new allergies or adverse reactions: No Accompanied By: None Had a fall or experienced change in No Transfer Assistance: None activities of daily living that may affect Patient Identification Verified: Yes risk of falls: Secondary Verification Process Completed: Yes Signs or symptoms of abuse/neglect since last visito No Patient Requires Transmission-Based Precautions: No Hospitalized since last visit: No Patient Has Alerts: No Implantable device outside of the clinic excluding No cellular tissue based products placed in the center since last visit: Pain Present Now: No Electronic Signature(s) Signed: 11/20/2022 2:44:17 PM By: Valeria Batman EMT Entered By: Valeria Batman on 11/20/2022 14:44:17 -------------------------------------------------------------------------------- Encounter Discharge Information Details Patient Name: Date of Service: [facility] [name], [name] 11/20/2022 12:00 PM Medical Record Number: [id number] Patient Account Number: [id number] Date of Birth/Sex: Treating RN: 1951/02/20 (72 y.o. [name] Primary Care Danyella Mcginty: [name] Other Clinician: Valeria Batman Referring Xayne Brumbaugh: Treating Yazlyn Wentzel/Extender: [name] Weeks in Treatment: 5 Encounter Discharge  Information Items Discharge Condition: Stable Ambulatory Status: Cane Discharge Destination: Home Transportation: Private Auto Accompanied By: None Schedule Follow-up Appointment: Yes Clinical Summary of Care: Electronic Signature(s) Signed: 11/20/2022 2:54:30 PM By: Valeria Batman EMT Entered By: Valeria Batman on 11/20/2022 14:54:29 Ranes, Aadith ([id number]) [id number] Page 2 of 2 -------------------------------------------------------------------------------- Vitals Details Patient Name: Date of Service: [facility] [name], [name] 11/20/2022 12:00 PM Medical Record Number: [id number] Patient Account Number: [id number] Date of Birth/Sex: Treating RN: [birth year] (72 y.o. [name] Primary Care Alyssa Mancera: [name] Other Clinician: Valeria Batman Referring Lygia Olaes: Treating Phoebe Marter/Extender: [name] Weeks in Treatment: 5 Vital Signs Time Taken: 11:39 Capillary Blood Glucose (mg/dl): 216 Height (in): 72 Reference Range: 80 - 120 mg / dl Weight (lbs): 185 Body Mass Index (BMI): 25.1 Electronic Signature(s) Signed: 11/20/2022 2:44:33 PM By: Valeria Batman EMT Entered By: Valeria Batman on 11/20/2022 14:44:33

## 2022-11-21 NOTE — Progress Notes (Signed)
[place], [place] ([id number]) [id number] Page 1 of 2 Visit Report for 11/20/2022 HBO Details Patient Name: Date of Service: [facility] [name], [name] 11/20/2022 12:00 PM Medical Record Number: [id number] Patient Account Number: [id number] Date of Birth/Sex: Treating RN: October 31, 1950 (72 y.o. 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 HBO Treatment Details Treatment Number: 13 Patient Type: Outpatient Chamber Type: Monoplace Chamber Serial #: [id number] Treatment Protocol: 2.0 ATA with 90 minutes oxygen, and no air breaks Treatment Details Compression Rate Down: 2.0 psi / minute De-Compression Rate Up: 2.0 psi / minute Air breaks and breathing Decompress Decompress Compress Tx Pressure Begins Reached periods Begins Ends (leave unused spaces blank) Chamber Pressure (ATA 1 2 ------2 1 ) Clock Time (24 hr) 12:00 12:08 - - - - - - 13:38 13:47 Treatment Length: 107 (minutes) Treatment Segments: 4 Vital Signs Capillary Blood Glucose Reference Range: 80 - 120 mg / dl HBO Diabetic Blood Glucose Intervention Range: <131 mg/dl or >249 mg/dl Time Vitals Blood Respiratory Capillary Blood Glucose Pulse Action Type: Pulse: Temperature: Taken: Pressure: Rate: Glucose (mg/dl): Meter #: Oximetry (%) Taken: Pre 11:39 216 Post 13:50 128/58 98 18 98.1 Pre 11:52 168/59 100 18 98.3 Treatment Response Treatment Toleration: Well Treatment Completion Status: Treatment Completed without Adverse Event Treatment Notes The patient stated that he had sausage egg and cheese sandwich with coffee and sprite this  morning. Physician HBO Attestation: I certify that I supervised this HBO treatment in accordance with Medicare guidelines. A trained emergency response team is readily available per Yes hospital policies and procedures. Continue HBOT as ordered. Yes Electronic Signature(s) Signed: 11/20/2022 5:03:16 PM By: [name] Maudlin MD FACS Previous Signature: 11/20/2022 2:51:30 PM Version By: [name] EMT Entered By: [name] on 11/20/2022 17:03:16 [name], [name] ([id number] Page 2 of 2 -------------------------------------------------------------------------------- HBO Safety Checklist Details Patient Name: Date of Service: [name], [name] 11/20/2022 12:00 PM Medical Record Number: [id number] Patient Account Number: [id number] Date of Birth/Sex: Treating RN: 09/26/1951 (72 y.o. [name], [name] Primary Care [name]: [name] Other Clinician: Valeria [name] Referring [name]: Treating [name]/Extender: [name] Weeks in Treatment: 5 HBO Safety Checklist Items Safety Checklist Consent Form Signed Patient voided / foley secured and emptied When did you last eato 1030 Last dose of injectable or oral agent last night Ostomy pouch emptied and vented if applicable NA All implantable devices assessed, documented and approved Libre2 Dialysis shunt Intravenous access site secured and place NA Valuables secured Linens and cotton and cotton/polyester blend (less than 51% polyester) Personal oil-based products / skin lotions / body lotions removed Wigs or hairpieces removed NA Smoking or tobacco materials removed Books / newspapers / magazines / loose paper removed Cologne, aftershave, perfume and deodorant removed Jewelry removed (may wrap wedding band) Make-up removed NA Hair care products removed Battery operated devices (external) removed Heating patches and chemical warmers removed Titanium eyewear  removed NA Nail polish cured greater than 10 hours NA Casting material cured greater than 10 hours NA Hearing aids removed NA Loose dentures or partials removed removed by patient Prosthetics have been removed NA Patient demonstrates correct use of air break device (if applicable) Patient concerns have been addressed Patient grounding bracelet on and cord attached to chamber  Specifics for Inpatients (complete in addition to above) Medication sheet sent with patient NA Intravenous medications needed or due during therapy sent with patient NA Drainage tubes (e.g. nasogastric tube or chest tube secured and vented) NA Endotracheal or Tracheotomy tube secured NA Cuff deflated of air and inflated with saline NA Airway suctioned NA Notes The safety checklist was done before the treatment started. Electronic Signature(s) Signed: 11/20/2022 2:45:54 PM By: [name] EMT Entered By: [name] on 11/20/2022 14:45:53

## 2022-11-22 ENCOUNTER — Encounter (HOSPITAL_BASED_OUTPATIENT_CLINIC_OR_DEPARTMENT_OTHER): Payer: No Typology Code available for payment source | Admitting: General Surgery

## 2022-11-22 DIAGNOSIS — E11621 Type 2 diabetes mellitus with foot ulcer: Secondary | ICD-10-CM | POA: Diagnosis not present

## 2022-11-22 DIAGNOSIS — M86671 Other chronic osteomyelitis, right ankle and foot: Secondary | ICD-10-CM | POA: Diagnosis not present

## 2022-11-22 LAB — GLUCOSE, CAPILLARY
Glucose-Capillary: 217 mg/dL — ABNORMAL HIGH (ref 70–99)
Glucose-Capillary: 155 mg/dL — ABNORMAL HIGH (ref 70–99)

## 2022-11-22 NOTE — Progress Notes (Signed)
[place], [facility] ([id number]) [id number] Page 1 of 2 Visit Report for 11/21/2022 Problem List Details Patient Name: Date of Service: [name], [name] 11/21/2022 12:00 PM Medical Record Number: [id number] Patient Account Number: [id number] Date of Birth/Sex: Treating RN: October 30, 1950 (72 y.o. 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 E11.621 Type 2 diabetes mellitus with foot ulcer 10/12/2022 No Yes N18.5 Chronic kidney disease, stage 5 10/12/2022 No Yes M86.9 Osteomyelitis, unspecified 10/12/2022 No Yes E11.42 Type 2 diabetes mellitus with diabetic polyneuropathy 10/12/2022 No Yes I10 Essential (primary) hypertension 10/12/2022 No Yes Inactive Problems Resolved Problems Electronic Signature(s) Signed: 11/21/2022 3:02:32 PM By: Valeria Batman EMT Signed: 11/21/2022 3:07:11 PM By: Fredirick Maudlin MD FACS Entered By: Valeria Batman on 11/21/2022 15:02:32 -------------------------------------------------------------------------------- SuperBill Details Patient Name: Date of Service: [facility] [name], [name] 11/21/2022 Medical Record Number: [id number] Patient Account Number: [id number] Date of Birth/Sex: Treating RN: 1951/09/20 (72 y.o. [name], [name] Primary Care Provider: [name] Other Clinician: Valeria Batman Referring Provider: Treating Provider/Extender: [name] Weeks in Treatment: 5 [place], [name] ([id number]) [id number] Page 2 of 2 Diagnosis Coding ICD-10 Codes Code Description L97.514 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.621 Type 2 diabetes  mellitus with foot ulcer N18.5 Chronic kidney disease, stage 5 M86.9 Osteomyelitis, unspecified E11.42 Type 2 diabetes mellitus with diabetic polyneuropathy I10 Essential (primary) hypertension Facility Procedures : CPT4 Code Description: [id number] G0277-(Facility Use Only) HBOT full body chamber, 56mn , ICD-10 Diagnosis Description M86.9 Osteomyelitis, unspecified L97.514 Non-pressure chronic ulcer of other part of right foot wi E11.621 Type 2 diabetes mellitus  with foot ulcer Modifier: th necrosis Quantity: 4 of bone Physician Procedures : CPT4 Code Description Modifier [id number] PHYS HYPERBARIC OXYGEN THERAPY ICD-10 Diagnosis Description M86.9 Osteomyelitis, unspecified L97.514 Non-pressure chronic ulcer of other part of right foot with necrosis o E11.621 Type 2 diabetes  mellitus with foot ulcer Quantity: 1 f bone Electronic Signature(s) Signed: 11/21/2022 3:02:27 PM By: GValeria BatmanEMT Signed: 11/21/2022 3:07:11 PM By: CFredirick MaudlinMD FACS Entered By: GValeria Batmanon 11/21/2022 15:02:26

## 2022-11-22 NOTE — Progress Notes (Signed)
[place], [place] ([id number]) [id number] Page 1 of 2 Visit Report for 11/21/2022 HBO Details Patient Name: Date of Service: [name], [name] 11/21/2022 12:00 PM Medical Record Number: [id number] Patient Account Number: [id number] Date of Birth/Sex: Treating RN: 08-05-1951 (73 y.o. 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 HBO Treatment Details Treatment Number: 14 Patient Type: Outpatient Chamber Type: Monoplace Chamber Serial #: [id number] Treatment Protocol: 2.0 ATA with 90 minutes oxygen, and no air breaks Treatment Details Compression Rate Down: 2.0 psi / minute De-Compression Rate Up: 2.0 psi / minute Air breaks and breathing Decompress Decompress Compress Tx Pressure Begins Reached periods Begins Ends (leave unused spaces blank) Chamber Pressure (ATA 1 2 ------2 1 ) Clock Time (24 hr) 12:24 12:35 - - - - - - 14:05 14:12 Treatment Length: 108 (minutes) Treatment Segments: 4 Vital Signs Capillary Blood Glucose Reference Range: 80 - 120 mg / dl HBO Diabetic Blood Glucose Intervention Range: <131 mg/dl or >249 mg/dl Time Vitals Blood Respiratory Capillary Blood Glucose Pulse Action Type: Pulse: Temperature: Taken: Pressure: Rate: Glucose (mg/dl): Meter #: Oximetry (%) Taken: Pre 11:42 205 Post 14:18 134/62 90 18 98.4 273 Pre 12:19 125/55 103 18 98.1 Treatment Response Treatment Toleration: Well Treatment Completion Status: Treatment Completed without Adverse Event Treatment Notes The patient stated that he had just ate at [place] before coming in for treatment  today. Dr. Celine Ahr informed of the patient pretreatment heart rate. Physician HBO Attestation: I certify that I supervised this HBO treatment in accordance with Medicare guidelines. A trained emergency response team is readily available per Yes hospital policies and procedures. Continue HBOT as ordered. Yes Electronic Signature(s) Signed: 11/21/2022 3:09:53 PM By: Fredirick Maudlin MD FACS Previous Signature: 11/21/2022 3:00:08 PM Version By: Valeria Batman EMT Entered By: Fredirick Maudlin on 11/21/2022 15:09:53 [place], Arn ([id number] Page 2 of 2 -------------------------------------------------------------------------------- HBO Safety Checklist Details Patient Name: Date of ServiceJodene [name], [name] 11/21/2022 12:00 PM Medical Record Number: [id number] Patient Account Number: [id number] Date of Birth/Sex: Treating RN: 11-09-1950 (72 y.o. [name], [name] Primary Care [place]: [name] Other Clinician: Valeria Batman Referring Latesia Norrington: Treating Karyna Bessler/Extender: [name] Weeks in Treatment: 5 HBO Safety Checklist Items Safety Checklist Consent Form Signed Patient voided / foley secured and emptied When did you last eato 1110 Last dose of injectable or oral agent Yesterday Ostomy pouch emptied and vented if applicable NA All implantable devices assessed, documented and approved Libre2 Dialysis shunt Intravenous access site secured and place NA Valuables secured Linens and cotton and cotton/polyester blend (less than 51% polyester) Personal oil-based products / skin lotions / body lotions removed Wigs or hairpieces removed NA Smoking or tobacco materials removed Books / newspapers / magazines / loose paper removed Cologne, aftershave, perfume and deodorant removed Jewelry removed (may wrap wedding band) Make-up removed NA Hair care products removed Battery operated devices (external) removed Heating patches  and chemical warmers removed Titanium eyewear removed NA Nail polish cured greater than 10 hours NA Casting material cured greater than 10 hours NA Hearing aids removed NA Loose dentures or partials removed Did not have them with him today Prosthetics have been removed NA Patient demonstrates correct use of air break device (if applicable) Patient concerns  have been addressed Patient grounding bracelet on and cord attached to chamber Specifics for Inpatients (complete in addition to above) Medication sheet sent with patient NA Intravenous medications needed or due during therapy sent with patient NA Drainage tubes (e.g. nasogastric tube or chest tube secured and vented) NA Endotracheal or Tracheotomy tube secured NA Cuff deflated of air and inflated with saline NA Airway suctioned NA Notes The safety checklist was done before the treatment started. Electronic Signature(s) Signed: 11/21/2022 3:44:13 PM By: Valeria Batman EMT Previous Signature: 11/21/2022 2:57:08 PM Version By: Valeria Batman EMT Entered By: Valeria Batman on 11/21/2022 15:44:12

## 2022-11-22 NOTE — Progress Notes (Signed)
[name] ([id number]) [id number] Page 1 of 2 Visit Report for 11/21/2022 Arrival Information Details Patient Name: Date of Service: [name], [name] 11/21/2022 12:00 PM Medical Record Number: [id number] Patient Account Number: [id number] Date of Birth/Sex: Treating RN: 14-Nov-1950 (72 y.o. [name], [name] Primary Care Hamp Moreland: [name] Other Clinician: Valeria Batman Referring Maicie Vanderloop: Treating Avonda Toso/Extender: [name] Weeks in Treatment: 5 Visit Information History Since Last Visit All ordered tests and consults were completed: Yes Patient Arrived: Cane Added or deleted any medications: No Arrival Time: 11:37 Any new allergies or adverse reactions: No Accompanied By: None Had a fall or experienced change in No Transfer Assistance: None activities of daily living that may affect Patient Identification Verified: Yes risk of falls: Secondary Verification Process Completed: Yes Signs or symptoms of abuse/neglect since last visito No Patient Requires Transmission-Based Precautions: No Hospitalized since last visit: No Patient Has Alerts: No Implantable device outside of the clinic excluding No cellular tissue based products placed in the center since last visit: Pain Present Now: No Electronic Signature(s) Signed: 11/21/2022 2:55:48 PM By: Valeria Batman EMT Entered By: Valeria Batman on 11/21/2022 14:55:48 -------------------------------------------------------------------------------- Encounter Discharge Information Details Patient Name: Date of Service: [facility] [name], [name] 11/21/2022 12:00 PM Medical Record Number: [id number] Patient Account Number: [id number] Date of Birth/Sex: Treating RN: [birth year] (72 y.o. [name], [name] Primary Care [place]: [name] Other Clinician: Valeria Batman Referring Syvilla Martin: Treating Jahniyah Revere/Extender: [name] Weeks in Treatment: 5 Encounter Discharge  Information Items Discharge Condition: Stable Ambulatory Status: Cane Discharge Destination: Home Transportation: Private Auto Accompanied By: None Schedule Follow-up Appointment: Yes Clinical Summary of Care: Electronic Signature(s) Signed: 11/21/2022 3:03:07 PM By: Valeria Batman EMT Entered By: Valeria Batman on 11/21/2022 15:03:07 [place], [name] ([id number]) [id number] Page 2 of 2 -------------------------------------------------------------------------------- Vitals Details Patient Name: Date of ServiceJodene [name], [name] 11/21/2022 12:00 PM Medical Record Number: [id number] Patient Account Number: [id number] Date of Birth/Sex: Treating RN: 27-Jul-1951 (72 y.o. [name], [name] Primary Care Buddie Marston: [name] Other Clinician: Valeria Batman Referring Janeva Peaster: Treating Lam Bjorklund/Extender: [name] Weeks in Treatment: 5 Vital Signs Time Taken: 11:42 Capillary Blood Glucose (mg/dl): 205 Height (in): 72 Reference Range: 80 - 120 mg / dl Weight (lbs): 185 Body Mass Index (BMI): 25.1 Electronic Signature(s) Signed: 11/21/2022 2:56:04 PM By: Valeria Batman EMT Entered By: Valeria Batman on 11/21/2022 14:56:03

## 2022-11-23 ENCOUNTER — Encounter (HOSPITAL_BASED_OUTPATIENT_CLINIC_OR_DEPARTMENT_OTHER): Payer: Self-pay

## 2022-11-23 ENCOUNTER — Encounter (HOSPITAL_BASED_OUTPATIENT_CLINIC_OR_DEPARTMENT_OTHER): Payer: No Typology Code available for payment source | Admitting: General Surgery

## 2022-11-23 DIAGNOSIS — E11621 Type 2 diabetes mellitus with foot ulcer: Secondary | ICD-10-CM | POA: Diagnosis not present

## 2022-11-23 DIAGNOSIS — M86671 Other chronic osteomyelitis, right ankle and foot: Secondary | ICD-10-CM | POA: Diagnosis not present

## 2022-11-23 LAB — GLUCOSE, CAPILLARY
Glucose-Capillary: 206 mg/dL — ABNORMAL HIGH (ref 70–99)
Glucose-Capillary: 286 mg/dL — ABNORMAL HIGH (ref 70–99)

## 2022-11-23 NOTE — Progress Notes (Addendum)
Inkerman, Laiden ([id number]) [id number] Page 1 of 1 Visit Report for 11/22/2022 SuperBill Details Patient Name: Date of Service: [name], [name] [name] 11/22/2022 Medical Record Number: [id number] Patient Account Number: [id number] Date of Birth/Sex: Treating RN: 01-23-1951 (72 y.o. 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, stage 5 M86.9 Osteomyelitis, unspecified E11.42 Type 2 diabetes mellitus with diabetic polyneuropathy I10 Essential (primary) hypertension Facility Procedures CPT4 Code Description Modifier Quantity [id number] G0277-(Facility Use Only) HBOT full body chamber, 82mn , 4 ICD-10 Diagnosis Description M86.9 Osteomyelitis, unspecified L97.514 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.621 Type 2 diabetes mellitus with foot ulcer Physician Procedures Quantity CPT4 Code Description Modifier [id number] PHYS HYPERBARIC OXYGEN THERAPY 1 ICD-10 Diagnosis Description M86.9 Osteomyelitis, unspecified L97.514 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.621 Type 2 diabetes mellitus with foot ulcer Electronic Signature(s) Signed: 11/22/2022 2:28:51 PM By: SDonavan BurnetCHT EMT BS , , Signed: 11/22/2022 3:10:48 PM By: CFredirick MaudlinMD FACS Entered By: SDonavan Burneton 11/22/2022 14:28:51

## 2022-11-23 NOTE — Progress Notes (Signed)
Adwolf, Stewart ([id number]) [id number] Page 1 of 2 Visit Report for 11/22/2022 Arrival Information Details Patient Name: Date of Service: [name], [name] 11/22/2022 12:00 PM Medical Record Number: [id number] Patient Account Number: [id number] Date of Birth/Sex: Treating RN: 03/20/1951 (72 y.o. [name] Primary Care Lam Mccubbins: [name] Other Clinician: Donavan Burnet Referring Brookie Wayment: Treating Dawsyn Ramsaran/Extender: [name] Weeks in Treatment: 5 Visit Information History Since Last Visit All ordered tests and consults were completed: Yes Patient Arrived: [name] Added or deleted any medications: No Arrival Time: 11:17 Any new allergies or adverse reactions: No Accompanied By: self Had a fall or experienced change in No Transfer Assistance: None activities of daily living that may affect Patient Identification Verified: Yes risk of falls: Secondary Verification Process Completed: Yes Signs or symptoms of abuse/neglect since last visito No Patient Requires Transmission-Based Precautions: No Hospitalized since last visit: No Patient Has Alerts: No Implantable device outside of the clinic excluding No cellular tissue based products placed in the center since last visit: Pain Present Now: No Electronic Signature(s) Signed: 11/22/2022 2:19:07 PM By: Donavan Burnet CHT EMT BS , , Entered By: Donavan Burnet on 11/22/2022 14:19:07 -------------------------------------------------------------------------------- Encounter Discharge Information Details Patient Name: Date of Service: [name], [name] 11/22/2022 12:00 PM Medical Record Number: [id number] Patient Account Number: [id number] Date of Birth/Sex: Treating RN: 11/23/1950 (72 y.o. [name] Primary Care Ghali Morissette: [name] Other Clinician: Donavan Burnet Referring Kayler Buckholtz: Treating Quinette Hentges/Extender: [name] Weeks in  Treatment: 5 Encounter Discharge Information Items Discharge Condition: Stable Ambulatory Status: Cane Discharge Destination: Home Transportation: Private Auto Accompanied By: self Schedule Follow-up Appointment: No Clinical Summary of Care: Electronic Signature(s) Signed: 11/22/2022 4:36:36 PM By: Donavan Burnet CHT EMT BS , , Entered By: Donavan Burnet on 11/22/2022 16:36:35 Musial, [name] ([id number]) [id number] Page 2 of 2 -------------------------------------------------------------------------------- Vitals Details Patient Name: Date of Service: [name], [name] 11/22/2022 12:00 PM Medical Record Number: [id number] Patient Account Number: [id number] Date of Birth/Sex: Treating RN: [birth year] (72 y.o. [name] Primary Care Pessy Delamar: [name] Other Clinician: Donavan Burnet Referring Lakima Dona: Treating Teasia Zapf/Extender: [name] Weeks in Treatment: 5 Vital Signs Time Taken: 11:44 Temperature (F): 98.0 Height (in): 72 Pulse (bpm): 97 Weight (lbs): 185 Respiratory Rate (breaths/min): 18 Body Mass Index (BMI): 25.1 Blood Pressure (mmHg): 151/61 Capillary Blood Glucose (mg/dl): 217 Reference Range: 80 - 120 mg / dl Electronic Signature(s) Signed: 11/22/2022 2:19:40 PM By: Donavan Burnet CHT EMT BS , , Entered By: Donavan Burnet on 11/22/2022 14:19:40

## 2022-11-23 NOTE — Progress Notes (Signed)
[place], Bryne ([id number]) [id number] Page 1 of 2 Visit Report for 11/22/2022 HBO Details Patient Name: Date of Service: [name], [name] 11/22/2022 12:00 PM Medical Record Number: [id number] Patient Account Number: [id number] Date of Birth/Sex: Treating RN: Feb 08, 1951 (72 y.o. 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 HBO Treatment Details Treatment Number: 15 Patient Type: Outpatient Chamber Type: Monoplace Chamber Serial #: [id number] Treatment Protocol: 2.0 ATA with 90 minutes oxygen, and no air breaks Treatment Details Compression Rate Down: 1.5 psi / minute De-Compression Rate Up: 2.0 psi / minute Air breaks and breathing Decompress Decompress Compress Tx Pressure Begins Reached periods Begins Ends (leave unused spaces blank) Chamber Pressure (ATA 1 2 ------2 1 ) Clock Time (24 hr) 11:49 11:59 - - - - - - 12:29 13:39 Treatment Length: 110 (minutes) Treatment Segments: 4 Vital Signs Capillary Blood Glucose Reference Range: 80 - 120 mg / dl HBO Diabetic Blood Glucose Intervention Range: <131 mg/dl or >249 mg/dl Type: Time Vitals Blood Respiratory Capillary Blood Glucose Pulse Action Pulse: Temperature: Taken: Pressure: Rate: Glucose (mg/dl): Meter #: Oximetry (%) Taken: Pre 11:44 151/61 97 18 98 217 1 none per protocol Post 13:44 127/71 88 18 98.2 155 1 none per protocol Treatment Response Treatment Toleration: Well Treatment Completion Status: Treatment Completed without Adverse Event Treatment Notes Patient arrived, blood glucose measured at 217 mg/dL. Patient  ate [employer] meal today. Other vital signs taken prior to treatment; within range. After performing safety check, patient was placed in the chamber which was compressed at 1 psi/min until patient confirmed normal ear equalization and set rate was increased to 2 psi/min until patient reached treatment depth of 2 ATA. Patient tolerated treatment and decompression of the chamber at 2 psi/min. Post treatment vital signs were normal. Mr. Habetz was stable upon discharge. Physician HBO Attestation: I certify that I supervised this HBO treatment in accordance with Medicare guidelines. A trained emergency response team is readily available per Yes hospital policies and procedures. Continue HBOT as ordered. Yes Electronic Signature(s) Signed: 11/22/2022 3:11:22 PM By: Fredirick Maudlin MD FACS Previous Signature: 11/22/2022 2:24:30 PM Version By: Donavan Burnet CHT EMT BS , , Entered By: Fredirick Maudlin on 11/22/2022 15:11:22 Keenum, Esley ([id number] Page 2 of 2 -------------------------------------------------------------------------------- HBO Safety Checklist Details Patient Name: Date of Service: [name], [name] 11/22/2022 12:00 PM Medical Record Number: [id number] Patient Account Number: [id number] Date of Birth/Sex: Treating RN: [birth year] (72 y.o. [name] Primary Care Louis Gaw: [name] Other Clinician: Donavan Burnet Referring Sommer Spickard: Treating Ernesto Lashway/Extender: [name] Weeks in Treatment: 5 HBO Safety Checklist Items Safety Checklist Consent Form Signed Patient voided / foley secured and emptied When did you last eato 1115 Last dose of injectable or oral agent Last Evening Ostomy pouch emptied and vented if applicable NA All implantable devices assessed, documented and approved NA Intravenous access site secured and place NA Valuables secured Linens and cotton and cotton/polyester blend (less than  51% polyester) Personal oil-based products / skin lotions / body lotions removed Wigs or hairpieces removed NA Smoking or tobacco materials removed NA Books / newspapers / magazines / loose paper removed Cologne, aftershave, perfume and deodorant removed Jewelry removed (may wrap wedding band) Make-up removed NA  Hair care products removed Battery operated devices (external) removed Heating patches and chemical warmers removed Titanium eyewear removed Nail polish cured greater than 10 hours NA Casting material cured greater than 10 hours NA Hearing aids removed NA Loose dentures or partials removed dentures removed Prosthetics have been removed NA Patient demonstrates correct use of air break device (if applicable) Patient concerns have been addressed Patient grounding bracelet on and cord attached to chamber Specifics for Inpatients (complete in addition to above) Medication sheet sent with patient NA Intravenous medications needed or due during therapy sent with patient NA Drainage tubes (e.g. nasogastric tube or chest tube secured and vented) NA Endotracheal or Tracheotomy tube secured NA Cuff deflated of air and inflated with saline NA Airway suctioned NA Notes The safety checklist was done before the treatment started. Electronic Signature(s) Signed: 11/22/2022 2:20:38 PM By: Donavan Burnet CHT EMT BS , , Entered By: Donavan Burnet on 11/22/2022 14:20:38

## 2022-11-24 ENCOUNTER — Encounter (HOSPITAL_BASED_OUTPATIENT_CLINIC_OR_DEPARTMENT_OTHER): Payer: No Typology Code available for payment source | Admitting: General Surgery

## 2022-11-24 DIAGNOSIS — M86671 Other chronic osteomyelitis, right ankle and foot: Secondary | ICD-10-CM | POA: Diagnosis not present

## 2022-11-24 DIAGNOSIS — E11621 Type 2 diabetes mellitus with foot ulcer: Secondary | ICD-10-CM | POA: Diagnosis not present

## 2022-11-24 LAB — GLUCOSE, CAPILLARY
Glucose-Capillary: 198 mg/dL — ABNORMAL HIGH (ref 70–99)
Glucose-Capillary: 186 mg/dL — ABNORMAL HIGH (ref 70–99)

## 2022-11-25 NOTE — Progress Notes (Signed)
[name], [place] ([id number]) [id number] Page 1 of 2 Visit Report for 11/23/2022 HBO Details Patient Name: Date of Service: [name], [name] 11/23/2022 12:00 PM Medical Record Number: [id number] Patient Account Number: [id number] Date of Birth/Sex: Treating RN: 04-Jun-1951 (72 y.o. 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 HBO Treatment Details Treatment Number: 17 Patient Type: Outpatient Chamber Type: Monoplace Chamber Serial #: [id number] Treatment Protocol: 2.0 ATA with 90 minutes oxygen, and no air breaks Treatment Details Compression Rate Down: 2.0 psi / minute De-Compression Rate Up: 2.0 psi / minute Air breaks and breathing Decompress Decompress Compress Tx Pressure Begins Reached periods Begins Ends (leave unused spaces blank) Chamber Pressure (ATA 1 2 ------2 1 ) Clock Time (24 hr) 11:39 11:48 - - - - - - 13:18 13:25 Treatment Length: 106 (minutes) Treatment Segments: 4 Vital Signs Capillary Blood Glucose Reference Range: 80 - 120 mg / dl HBO Diabetic Blood Glucose Intervention Range: <131 mg/dl or >249 mg/dl Time Vitals Blood Respiratory Capillary Blood Glucose Pulse Action Type: Pulse: Temperature: Taken: Pressure: Rate: Glucose (mg/dl): Meter #: Oximetry (%) Taken: Pre 11:15 160/72 112 18 98.1 206 1 Post 13:30 144/59 91 18 98.1 286 1 Treatment Response Treatment Completion Status: Treatment Completed without Adverse Event Physician HBO Attestation: I certify that I supervised this HBO treatment in accordance with Medicare guidelines. A trained emergency response  team is readily available per Yes hospital policies and procedures. Continue HBOT as ordered. Yes Electronic Signature(s) Signed: 11/27/2022 8:42:52 AM By: Fredirick Maudlin MD FACS Previous Signature: 11/24/2022 4:37:20 PM Version By: Donavan Burnet CHT EMT BS , , Previous Signature: 11/24/2022 4:36:40 PM Version By: Donavan Burnet CHT EMT BS , , Entered By: Fredirick Maudlin on 11/27/2022 08:42:51 -------------------------------------------------------------------------------- HBO Safety Checklist Details Patient Name: Date of Service: [facility], [name] 11/23/2022 12:00 PM Medical Record Number: [id number] Patient Account Number: [id number] Date of Birth/Sex: Treating RN: 1950/11/26 (72 y.o. [name] Primary Care Maranda Marte: [name] Other Clinician: Valeria Batman Referring Anaiza Behrens: Treating Reni Hausner/Extender: [name] [place], [name] ([id number]) [id number] Page 2 of 2 Weeks in Treatment: 6 HBO Safety Checklist Items Safety Checklist Consent Form Signed Patient voided / foley secured and emptied When did you last eato 1030 Last dose of injectable or oral agent last night Ostomy pouch emptied and vented if applicable NA All implantable devices assessed, documented and approved NA Intravenous access site secured and place NA Valuables secured Linens and cotton and cotton/polyester blend (less than 51% polyester) Personal oil-based products / skin lotions / body lotions removed Wigs or hairpieces removed NA Smoking or tobacco materials removed NA Books / newspapers / magazines / loose paper removed Cologne, aftershave, perfume and deodorant removed Jewelry removed (may wrap wedding band) Make-up removed NA Hair care products removed Battery operated devices (external) removed Heating patches and chemical warmers removed Titanium eyewear removed Nail polish cured greater than 10 hours NA Casting material cured  greater than 10 hours NA Hearing aids removed NA Loose dentures or partials removed dentures removed Prosthetics have been removed NA Patient demonstrates correct use of air break device (if applicable) Patient concerns have been addressed Patient grounding bracelet on and cord attached to chamber Specifics for Inpatients (complete in addition  to above) Medication sheet sent with patient NA Intravenous medications needed or due during therapy sent with patient NA Drainage tubes (e.g. nasogastric tube or chest tube secured and vented) NA Endotracheal or Tracheotomy tube secured NA Cuff deflated of air and inflated with saline NA Airway suctioned NA Notes The safety checklist was done before the treatment started. Electronic Signature(s) Signed: 11/24/2022 4:33:57 PM By: Donavan Burnet CHT EMT BS , , Entered By: Donavan Burnet on 11/24/2022 16:33:57

## 2022-11-25 NOTE — Progress Notes (Addendum)
[place], Dempsey ([id number]) [id number] Page 1 of 2 Visit Report for 11/23/2022 Arrival Information Details Patient Name: Date of Service: [name] [name], [name] [name] 11/23/2022 12:00 PM Medical Record Number: [id number] Patient Account Number: [id number] Date of Birth/Sex: Treating RN: [name] (72 y.o. [name] [name] Primary Care Luster Hechler: [name] Other Clinician: Valeria Batman Referring Javon Snee: Treating Malala Trenkamp/Extender: [name] Weeks in Treatment: 6 Visit Information History Since Last Visit All ordered tests and consults were completed: Yes Patient Arrived: [name] [name] Added or deleted any medications: No Arrival Time: 11:09 Any new allergies or adverse reactions: No Accompanied By: self Had a fall or experienced change in No Transfer Assistance: None activities of daily living that may affect Patient Identification Verified: Yes risk of falls: Secondary Verification Process Completed: Yes Signs or symptoms of abuse/neglect since last visito No Patient Requires Transmission-Based Precautions: No Hospitalized since last visit: No Patient Has Alerts: No Implantable device outside of the clinic excluding No cellular tissue based products placed in the center since last visit: Pain Present Now: No Electronic Signature(s) Signed: 11/24/2022 4:32:18 PM By: Donavan Burnet CHT EMT BS , , Entered By: Donavan Burnet on [name]/23/2024 16:32:18 -------------------------------------------------------------------------------- Encounter Discharge Information Details Patient Name: Date of Service: [facility], [name] [name] 11/23/2022 12:00 PM Medical Record Number: [id number] Patient Account Number: [id number] Date of Birth/Sex: Treating RN: [name] (72 y.o. [name] [name] Primary Care Eola Waldrep: [name] Other Clinician: Valeria Batman Referring Anaira Seay: Treating Salisa Broz/Extender: [name] Weeks in Treatment:  6 Encounter Discharge Information Items Discharge Condition: Stable Ambulatory Status: Ambulatory Discharge Destination: Home Transportation: Private Auto Accompanied By: self Schedule Follow-up Appointment: No Clinical Summary of Care: Electronic Signature(s) Signed: 11/24/2022 4:41:46 PM By: Donavan Burnet CHT EMT BS , , Entered By: Donavan Burnet on [name]/23/2024 16:41:45 -------------------------------------------------------------------------------- Vitals Details Patient Name: Date of Service: [name] [name], [name] [name] 11/23/2022 12:00 PM Medical Record Number: [id number] Patient Account Number: [id number] Date of Birth/Sex: Treating RN: [name] [name], [name] [name] Primary Care Takari Lundahl: [name] Other Clinician: Valeria Batman Referring Adine Heimann: Treating Mychal Durio/Extender: [name] Weeks in Treatment: 6 Vital Signs Time Taken: 11:15 Temperature (F): 98.1 [place], [place] ([id number]) [phone].pdf Page 2 of 2 Height (in): 72 Pulse (bpm): 112 Weight (lbs): 185 Respiratory Rate (breaths/min): 18 Body Mass Index (BMI): 25.1 Blood Pressure (mmHg): 160/72 Capillary Blood Glucose (mg/dl): 206 Reference Range: 80 - 120 mg / dl Electronic Signature(s) Signed: 11/24/2022 4:33:[name] By: Donavan Burnet CHT EMT BS , , Entered By: Donavan Burnet on [name]/23/2024 16:33:[name]

## 2022-11-27 ENCOUNTER — Encounter (HOSPITAL_BASED_OUTPATIENT_CLINIC_OR_DEPARTMENT_OTHER): Payer: No Typology Code available for payment source | Admitting: General Surgery

## 2022-11-27 DIAGNOSIS — L97512 Non-pressure chronic ulcer of other part of right foot with fat layer exposed: Secondary | ICD-10-CM | POA: Diagnosis not present

## 2022-11-27 DIAGNOSIS — L97514 Non-pressure chronic ulcer of other 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: Secondary | ICD-10-CM | POA: Diagnosis not present

## 2022-11-27 DIAGNOSIS — L600 Ingrowing nail: Secondary | ICD-10-CM | POA: Diagnosis not present

## 2022-11-27 DIAGNOSIS — M86671 Other chronic osteomyelitis, right ankle and foot: Secondary | ICD-10-CM | POA: Diagnosis not present

## 2022-11-27 DIAGNOSIS — E11621 Type 2 diabetes mellitus with foot ulcer: Secondary | ICD-10-CM | POA: Diagnosis not present

## 2022-11-27 LAB — GLUCOSE, CAPILLARY
Glucose-Capillary: 141 mg/dL — ABNORMAL HIGH (ref 70–99)
Glucose-Capillary: 201 mg/dL — ABNORMAL HIGH (ref 70–99)

## 2022-11-27 NOTE — Progress Notes (Signed)
JOSEPH, CARLSTROM ([id number]) [id number] Page 1 of 2 Visit Report for 11/24/2022 Problem List Details Patient Name: Date of Service: [name], [name] 11/24/2022 12:00 PM Medical Record Number: [id number] Patient Account Number: [id number] Date of Birth/Sex: Treating RN: 1951/09/29 (72 y.o. 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, stage 5 10/12/2022 No Yes M86.9 Osteomyelitis, unspecified 10/12/2022 No Yes E11.42 Type 2 diabetes mellitus with diabetic polyneuropathy 10/12/2022 No Yes I10 Essential (primary) hypertension 10/12/2022 No Yes Inactive Problems Resolved Problems Electronic Signature(s) Signed: 11/24/2022 1:25:11 PM By: Valeria Batman EMT Signed: 11/24/2022 3:28:32 PM By: Fredirick Maudlin MD FACS Entered By: Valeria Batman on 11/24/2022 13:25:10 -------------------------------------------------------------------------------- SuperBill Details Patient Name: Date of Service: [name], [name] 11/24/2022 Medical Record Number: [id number] Patient Account Number: [id number] Date of Birth/Sex: Treating RN: [birth year] (72 y.o. 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, stage 5 M86.9 Osteomyelitis, unspecified E11.42 Type 2 diabetes mellitus with diabetic polyneuropathy I10 Essential (primary) hypertension Facility Procedures : CPT4 Code Description: [id number] G0277-(Facility Use Only) HBOT full body chamber, 71mn , ICD-10 Diagnosis Description M86.9 Osteomyelitis, unspecified L97.514 Non-pressure chronic ulcer of other part of right foo E11.42 Type 2 diabetes mellitus with  diabetic polyneuropathy E11.621 Type 2 diabetes mellitus with foot ulcer Modifier: t with necrosis Quantity: 4 of bone Physician Procedures : CPT4 Code Description Modifier [id number] PHYS HYPERBARIC OXYGEN THERAPY ICD-10 Diagnosis Description M86.9 Osteomyelitis, unspecified L97.514 Non-pressure chronic ulcer of other part of right foot with necrosis o E11.42 Type 2 diabetes  mellitus with diabetic polyneuropathy E11.621 Type 2 diabetes mellitus with foot ulcer Quantity: 1 f bone Electronic Signature(s) Signed: 11/24/2022 1:25:00 PM By: GValeria BatmanEMT Signed: 11/24/2022 3:28:32 PM By: CFredirick MaudlinMD FACS Entered By: GValeria Batmanon 11/24/2022 13:24:59

## 2022-11-27 NOTE — Progress Notes (Signed)
[name], [name] ([id number]) [id number] Page 1 of 1 Visit Report for 11/23/2022 SuperBill Details Patient Name: Date of Service: [name], [name] 11/23/2022 Medical Record Number: [id number] Patient Account Number: [id number] Date of Birth/Sex: Treating RN: [birth year] (72 y.o. 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, stage 5 M86.9 Osteomyelitis, unspecified E11.42 Type 2 diabetes mellitus with diabetic polyneuropathy I10 Essential (primary) hypertension Facility Procedures CPT4 Code Description Modifier Quantity [id number] G0277-(Facility Use Only) HBOT full body chamber, 58mn , 4 ICD-10 Diagnosis Description M86.9 Osteomyelitis, unspecified 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.621 Type 2 diabetes mellitus with foot ulcer Physician Procedures Quantity CPT4 Code Description Modifier [id number] PHYS HYPERBARIC OXYGEN THERAPY 1 ICD-10 Diagnosis Description M86.9 Osteomyelitis, unspecified 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

## 2022-11-27 NOTE — Progress Notes (Signed)
Deadwood, Yehudah ([id number]) [id number] Page 1 of 2 Visit Report for 11/24/2022 Arrival Information Details Patient Name: Date of Service: [name], [name] 11/24/2022 12:00 PM Medical Record Number: [id number] Patient Account Number: [id number] Date of Birth/Sex: Treating RN: August 02, 1951 (72 y.o. [name] Primary Care [name]: [name] Other Clinician: Valeria [name] Referring [name]: Treating [name]/Extender: [name] Weeks in Treatment: 6 Visit Information History Since Last Visit All ordered tests and consults were completed: Yes Patient Arrived: [name] Added or deleted any medications: No Arrival Time: 11:00 Any new allergies or adverse reactions: No Accompanied By: None Had a fall or experienced change in No Transfer Assistance: None activities of daily living that may affect Patient Identification Verified: Yes risk of falls: Secondary Verification Process Completed: Yes Signs or symptoms of abuse/neglect since last visito No Patient Requires Transmission-Based Precautions: No Hospitalized since last visit: No Patient Has Alerts: No Implantable device outside of the clinic excluding No cellular tissue based products placed in the center since last visit: Pain Present Now: No Electronic Signature(s) Signed: 11/24/2022 12:51:23 PM By: [name] EMT Entered By: [name] on 11/24/2022 12:51:23 -------------------------------------------------------------------------------- Encounter Discharge Information Details Patient Name: Date of Service: [name], [name] 11/24/2022 12:00 PM Medical Record Number: [id number] Patient Account Number: [id number] Date of Birth/Sex: Treating RN: 1951/07/15 (72 y.o. [name] Primary Care [name]: [name] Other Clinician: Valeria [name] Referring [name]: Treating [name]/Extender: [name] Weeks in Treatment: 6 Encounter Discharge  Information Items Discharge Condition: Stable Ambulatory Status: Cane Discharge Destination: Home Transportation: Private Auto Accompanied By: None Schedule Follow-up Appointment: Yes Clinical Summary of Care: Electronic Signature(s) Signed: 11/24/2022 1:25:56 PM By: [name] EMT Entered By: [name] on 11/24/2022 13:25:56 -------------------------------------------------------------------------------- Vitals Details Patient Name: Date of Service: [name], [name] 11/24/2022 12:00 PM Medical Record Number: [id number] Patient Account Number: [id number] Date of Birth/Sex: Treating RN: December 07, 1950 (71 y.o. [name] Primary Care [name]: [name] Other Clinician: Valeria [name] Referring [name]: Treating [name]/Extender: [name] Weeks in Treatment: 6 Vital Signs Time Taken: 11:19 Temperature (F): 97.9 [name] ([id number]) [phone].pdf Page 2 of 2 Height (in): 72 Pulse (bpm): 110 Weight (lbs): 185 Respiratory Rate (breaths/min): 18 Body Mass Index (BMI): 25.1 Blood Pressure (mmHg): 165/74 Capillary Blood Glucose (mg/dl): 198 Reference Range: 80 - 120 mg / dl Electronic Signature(s) Signed: 11/24/2022 12:52:11 PM By: [name] EMT Entered By: [name] on 11/24/2022 12:52:11

## 2022-11-27 NOTE — Progress Notes (Signed)
Athens, Donathan ([id number]) [id number] Page 1 of 2 Visit Report for 11/24/2022 HBO Details Patient Name: Date of Service: [name], [name] 11/24/2022 12:00 PM Medical Record Number: [id number] Patient Account Number: [id number] Date of Birth/Sex: Treating RN: June 28, 1951 (72 y.o. 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 HBO Treatment Details Treatment Number: 16 Patient Type: Outpatient Chamber Type: Monoplace Chamber Serial #: [id number] Treatment Protocol: 2.0 ATA with 90 minutes oxygen, and no air breaks Treatment Details Compression Rate Down: 2.0 psi / minute De-Compression Rate Up: Air breaks and breathing Decompress Decompress Compress Tx Pressure Begins Reached periods Begins Ends (leave unused spaces blank) Chamber Pressure (ATA 1 2 ------2 1 ) Clock Time (24 hr) 11:30 11:39 - - - - - - 13:09 13:16 Treatment Length: 106 (minutes) Treatment Segments: 4 Vital Signs Capillary Blood Glucose Reference Range: 80 - 120 mg / dl HBO Diabetic Blood Glucose Intervention Range: <131 mg/dl or >249 mg/dl Time Vitals Blood Respiratory Capillary Blood Glucose Pulse Action Type: Pulse: Temperature: Taken: Pressure: Rate: Glucose (mg/dl): Meter #: Oximetry (%) Taken: Pre 11:19 165/74 110 18 97.9 198 Post 13:21 110/90 93 18 98.2 186 Treatment Response Treatment Toleration: Well Treatment Completion Status: Treatment Completed without Adverse Event Treatment Notes Dr. Celine Ahr informed of the patient heart rate. Physician HBO Attestation: I certify that I supervised this HBO treatment  in accordance with Medicare guidelines. A trained emergency response team is readily available per Yes hospital policies and procedures. Continue HBOT as ordered. Yes Electronic Signature(s) Signed: 11/24/2022 1:40:29 PM By: Fredirick Maudlin MD FACS Previous Signature: 11/24/2022 1:24:21 PM Version By: Valeria Batman EMT Previous Signature: 11/24/2022 12:57:04 PM Version By: Valeria Batman EMT Previous Signature: 11/24/2022 12:54:19 PM Version By: Valeria Batman EMT Entered By: Fredirick Maudlin on 11/24/2022 13:40:28 -------------------------------------------------------------------------------- HBO Safety Checklist Details Patient Name: Date of Service: [facility] [name], [name] 11/24/2022 12:00 PM Hartley, [name] ([id number].pdf Page 2 of 2 Medical Record Number: [id number] Patient Account Number: [id number] Date of Birth/Sex: Treating RN: 1950-11-11 (72 y.o. [name] Primary Care Tobenna Needs: [name] Other Clinician: Valeria Batman Referring Shonta Bourque: Treating Suan Pyeatt/Extender: [name] Weeks in Treatment: 6 HBO Safety Checklist Items Safety Checklist Consent Form Signed Patient voided / foley secured and emptied When did you last eato 1045 Last dose of injectable or oral agent last night Ostomy pouch emptied and vented if applicable NA All implantable devices assessed, documented and approved Libre2 Dialysis shunt Intravenous access site secured and place NA Valuables secured Linens and cotton and cotton/polyester blend (less than 51% polyester) Personal oil-based products / skin lotions / body lotions removed Wigs or hairpieces removed NA Smoking or tobacco materials removed Books / newspapers / magazines / loose paper removed Cologne, aftershave, perfume and deodorant removed Jewelry removed (may wrap wedding band) Make-up removed NA Hair care products removed Battery operated devices (external) removed Heating patches  and chemical warmers removed Titanium eyewear removed NA Nail polish cured greater than 10 hours NA Casting material cured greater than 10 hours NA Hearing aids removed NA Loose dentures or partials removed removed by patient Prosthetics have been removed NA Patient demonstrates correct use of air break device (if applicable) Patient concerns have been addressed Patient grounding bracelet  on and cord attached to chamber Specifics for Inpatients (complete in addition to above) Medication sheet sent with patient NA Intravenous medications needed or due during therapy sent with patient NA Drainage tubes (e.g. nasogastric tube or chest tube secured and vented) NA Endotracheal or Tracheotomy tube secured NA Cuff deflated of air and inflated with saline NA Airway suctioned NA Notes The safety checklist was done before the treatment started. Electronic Signature(s) Signed: 11/24/2022 12:53:39 PM By: Valeria Batman EMT Entered By: Valeria Batman on 11/24/2022 12:53:39

## 2022-11-28 ENCOUNTER — Encounter (HOSPITAL_BASED_OUTPATIENT_CLINIC_OR_DEPARTMENT_OTHER): Payer: No Typology Code available for payment source | Admitting: General Surgery

## 2022-11-28 DIAGNOSIS — E11621 Type 2 diabetes mellitus with foot ulcer: Secondary | ICD-10-CM | POA: Diagnosis not present

## 2022-11-28 DIAGNOSIS — M86671 Other chronic osteomyelitis, right ankle and foot: Secondary | ICD-10-CM | POA: Diagnosis not present

## 2022-11-28 LAB — GLUCOSE, CAPILLARY
Glucose-Capillary: 223 mg/dL — ABNORMAL HIGH (ref 70–99)
Glucose-Capillary: 246 mg/dL — ABNORMAL HIGH (ref 70–99)

## 2022-11-28 NOTE — Progress Notes (Signed)
Brimson, Haedyn ([id number]) [id number] Page 1 of 2 Visit Report for 11/27/2022 Problem List Details Patient Name: Date of Service: [name], [name] 11/27/2022 12:00 PM Medical Record Number: [id number] Patient Account Number: [id number] Date of Birth/Sex: Treating RN: [birth year] (72 y.o. 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, stage 5 10/12/2022 No Yes M86.9 Osteomyelitis, unspecified 10/12/2022 No Yes E11.42 Type 2 diabetes mellitus with diabetic polyneuropathy 10/12/2022 No Yes I10 Essential (primary) hypertension 10/12/2022 No Yes Inactive Problems Resolved Problems Electronic Signature(s) Signed: 11/27/2022 2:49:31 PM By: Valeria Batman EMT Signed: 11/27/2022 5:09:54 PM By: Fredirick Maudlin MD FACS Entered By: Valeria Batman on 11/27/2022 14:49:31 -------------------------------------------------------------------------------- SuperBill Details Patient Name: Date of Service: [facility] [name], [name] 11/27/2022 Medical Record Number: [id number] Patient Account Number: [id number] Date of Birth/Sex: Treating RN: [birth year] (72 y.o. 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, stage 5 M86.9 Osteomyelitis, unspecified E11.42 Type 2 diabetes mellitus with diabetic polyneuropathy I10 Essential (primary) hypertension Facility Procedures : CPT4 Code Description: [id number]-(Facility Use Only) HBOT full body chamber, 35mn , ICD-10 Diagnosis Description M86.9 Osteomyelitis, unspecified L97.514 Non-pressure chronic ulcer of other part of right foo E11.42 Type 2 diabetes mellitus with  diabetic polyneuropathy E11.621 Type 2 diabetes mellitus with foot ulcer Modifier: t with necrosis Quantity: 4 of bone Physician Procedures : CPT4 Code Description Modifier [id number] PHYS HYPERBARIC OXYGEN THERAPY ICD-10 Diagnosis Description M86.9 Osteomyelitis, unspecified 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

## 2022-11-28 NOTE — Progress Notes (Signed)
[name], [name] ([id number]) [id number] Page 1 of 2 Visit Report for 11/27/2022 HBO Details Patient Name: Date of Service: [name], [name] 11/27/2022 12:00 PM Medical Record Number: [id number] Patient Account Number: [id number] Date of Birth/Sex: Treating RN: [birth year] (72 y.o. 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 HBO Treatment Details Treatment Number: 18 Patient Type: Outpatient Chamber Type: Monoplace Chamber Serial #: [id number] Treatment Protocol: 2.0 ATA with 90 minutes oxygen, and no air breaks Treatment Details Compression Rate Down: 2.0 psi / minute De-Compression Rate Up: 2.0 psi / minute Air breaks and breathing Decompress Decompress Compress Tx Pressure Begins Reached periods Begins Ends (leave unused spaces blank) Chamber Pressure (ATA 1 2 ------2 1 ) Clock Time (24 hr) 12:26 12:38 - - - - - - 14:08 14:16 Treatment Length: 110 (minutes) Treatment Segments: 4 Vital Signs Capillary Blood Glucose Reference Range: 80 - 120 mg / dl HBO Diabetic Blood Glucose Intervention Range: <131 mg/dl or >249 mg/dl Time Vitals Blood Respiratory Capillary Blood Glucose Pulse Action Type: Pulse: Temperature: Taken: Pressure: Rate: Glucose (mg/dl): Meter #: Oximetry (%) Taken: Pre 12:06 156/69 101 18 97.9 141 Post 14:22 134/84 92 18 97.3 201 Treatment Response Treatment Toleration: Well Treatment Completion Status: Treatment Completed without Adverse Event Physician HBO Attestation: I certify that I supervised this HBO treatment in accordance with Medicare guidelines. A  trained emergency response team is readily available per Yes hospital policies and procedures. Continue HBOT as ordered. Yes Electronic Signature(s) Signed: 11/27/2022 5:12:38 PM By: Fredirick Maudlin MD FACS Previous Signature: 11/27/2022 2:48:52 PM Version By: Valeria Batman EMT Entered By: Fredirick Maudlin on 11/27/2022 17:12:38 -------------------------------------------------------------------------------- HBO Safety Checklist Details Patient Name: Date of Service: [facility], [name] 11/27/2022 12:00 PM Medical Record Number: [id number] Patient Account Number: [id number] Date of Birth/Sex: Treating RN: [birth year] (72 y.o. [name] Primary Care [name]: [name] Other Clinician: Valeria Batman Referring [name]: Treating Dantonio Justen/Extender: [name] [place], [name] ([id number]) [id number] Page 2 of 2 Weeks in Treatment: 6 HBO Safety Checklist Items Safety Checklist Consent Form Signed Patient voided / foley secured and emptied When did you last eato 11130 Last dose of injectable or oral agent last night Ostomy pouch emptied and vented if applicable NA All implantable devices assessed, documented and approved Libre2 Dialysis shunt Intravenous access site secured and place NA Valuables secured Linens and cotton and cotton/polyester blend (less than 51% polyester) Personal oil-based products / skin lotions / body lotions removed Wigs or hairpieces removed NA Smoking or tobacco materials removed Books / newspapers / magazines / loose paper removed Cologne, aftershave, perfume and deodorant removed Jewelry removed (may wrap wedding band) Make-up removed NA Hair care products removed NA Battery operated devices (external) removed Heating patches and chemical warmers removed Titanium eyewear removed NA Nail polish cured greater than 10 hours NA Casting material cured greater than 10 hours NA Hearing aids  removed NA Loose dentures or partials removed removed by patient Prosthetics have been removed NA Patient demonstrates correct use of air break device (if applicable) Patient concerns have been addressed Patient grounding bracelet on and cord attached to chamber Specifics for Inpatients (complete in addition to above) Medication sheet sent with patient NA Intravenous medications needed or due  during therapy sent with patient NA Drainage tubes (e.g. nasogastric tube or chest tube secured and vented) NA Endotracheal or Tracheotomy tube secured NA Cuff deflated of air and inflated with saline NA Airway suctioned NA Notes The safety checklist was done before the treatment started. Electronic Signature(s) Signed: 11/27/2022 2:46:33 PM By: Valeria Batman EMT Entered By: Valeria Batman on 11/27/2022 14:46:33

## 2022-11-28 NOTE — Progress Notes (Signed)
Grover, Catarino ([id number]) [id number] Page 1 of 6 Visit Report for 11/27/2022 Arrival Information Details Patient Name: Date of Service: [name], [name] 11/27/2022 11:15 A M Medical Record Number: [id number] Patient Account Number: [id number] Date of Birth/Sex: Treating RN: 1951/07/28 (72 y.o. [name], [name] Primary Care Yader Criger: [name] Other Clinician: Referring Loriel Diehl: Treating Adair Lemar/Extender: [name] Weeks in Treatment: 6 Visit Information History Since Last Visit Added or deleted any medications: No Patient Arrived: [name] Any new allergies or adverse reactions: No Arrival Time: 14:30 Had a fall or experienced change in No Accompanied By: self activities of daily living that may affect Transfer Assistance: None risk of falls: Patient Identification Verified: Yes Signs or symptoms of abuse/neglect since last visito No Secondary Verification Process Completed: Yes Hospitalized since last visit: No Patient Requires Transmission-Based Precautions: No Implantable device outside of the clinic excluding No Patient Has Alerts: No cellular tissue based products placed in the center since last visit: Has Dressing in Place as Prescribed: Yes Pain Present Now: No Electronic Signature(s) Signed: 11/27/2022 3:50:45 PM By: Rhae Hammock RN Entered By: Rhae Hammock on 11/27/2022 14:31:00 -------------------------------------------------------------------------------- Lower Extremity Assessment Details Patient Name: Date of Service: [facility] [name], [name] 11/27/2022 11:15 A M Medical Record Number: [id number] Patient Account Number: [id number] Date of Birth/Sex: Treating RN: Oct 25, 1950 (72 y.o. [name], [name] Primary Care Lailana Shira: [name] Other Clinician: Referring Khristen Cheyney: Treating Payge Eppes/Extender: [name] Weeks in Treatment: 6 Edema Assessment Assessed: [Left: No] [Right: No] Edema:  [Left: N] [Right: o] Calf Left: Right: Point of Measurement: From Medial Instep 36 cm Ankle Left: Right: Point of Measurement: From Medial Instep 22 cm Vascular Assessment Pulses: Dorsalis Pedis Palpable: [Right:Yes] Posterior Tibial Palpable: [Right:Yes] Electronic Signature(s) Signed: 11/27/2022 3:50:45 PM By: Rhae Hammock RN Entered By: Rhae Hammock on 11/27/2022 14:32:59 Rathod, Khing ([id number] Page 2 of 6 -------------------------------------------------------------------------------- Multi Wound Chart Details Patient Name: Date of Service: [facility] [name], [name] 11/27/2022 11:15 A M Medical Record Number: [id number] Patient Account Number: [id number] Date of Birth/Sex: Treating RN: [birth year] (72 y.o. M) Primary Care Analis Distler: [name] Other Clinician: Referring Yul Diana: Treating Venancio Chenier/Extender: [name] Weeks in Treatment: 6 Vital Signs Height(in): 72 Capillary Blood Glucose(mg/dl): 201 Weight(lbs): 185 Pulse(bpm): 92 Body Mass Index(BMI): 25.1 Blood Pressure(mmHg): 134/84 Temperature(F): 97.3 Respiratory Rate(breaths/min): 17 [1:Photos:] [N/A:N/A] Right, Medial T Great oe N/A N/A Wound Location: Pressure Injury N/A N/A Wounding Event: Diabetic Wound/Ulcer of the Lower N/A N/A Primary Etiology: Extremity Acute Osteomyelitis N/A N/A Secondary Etiology: Cataracts, Hypertension, Type II N/A N/A Comorbid History: Diabetes, Osteomyelitis, Neuropathy, Confinement Anxiety 08/16/2022 N/A N/A Date Acquired: 6 N/A N/A Weeks of Treatment: Open N/A N/A Wound Status: No N/A N/A Wound Recurrence: 2x5x0.4 N/A N/A Measurements L x W x D (cm) 7.854 N/A N/A A (cm) : rea 3.142 N/A N/A Volume (cm) : -2756.00% N/A N/A % Reduction in A rea: -2756.40% N/A N/A % Reduction in Volume: Grade 3 N/A N/A Classification: Medium N/A N/A Exudate A mount: Serosanguineous N/A N/A Exudate  Type: red, brown N/A N/A Exudate Color: Distinct, outline attached N/A N/A Wound Margin: Small (1-33%) N/A N/A Granulation A mount: Pink N/A N/A Granulation Quality: Large (67-100%) N/A N/A Necrotic A mount: Fat Layer (Subcutaneous Tissue): Yes N/A N/A Exposed Structures: Tendon: Yes Bone: Yes Fascia: No Muscle: No Joint: No Small (1-33%) N/A N/A Epithelialization: Debridement - Excisional N/A N/A Debridement: Pre-procedure Verification/Time Out 14:45 N/A N/A Taken: Lidocaine N/A N/A Pain Control: Subcutaneous, Slough N/A N/A Tissue  Debrided: Skin/Subcutaneous Tissue N/A N/A Level: 10 N/A N/A Debridement A (sq cm): rea Curette N/A N/A Instrument: Minimum N/A N/A Bleeding: Pressure N/A N/A Hemostasis A chieved: 0 N/A N/A Procedural Pain: 0 N/A N/A Post Procedural Pain: Procedure was tolerated well N/A N/A Debridement Treatment Response: 2x5x0.4 N/A N/A Post Debridement Measurements L x W x D (cm) 3.142 N/A N/A Post Debridement Volume: (cm) Callus: Yes N/A N/A Periwound Skin Texture: Maceration: Yes N/A N/A Periwound Skin Moisture: No Abnormalities Noted N/A N/A Periwound Skin Color: No Abnormality N/A N/A Temperature: Debridement N/A N/A Procedures Performed: YANDEL, VALLS ([id number]) [phone].pdf Page 3 of 6 Excision nail and nail matrix, partial or complete Treatment Notes Electronic Signature(s) Signed: 11/27/2022 3:13:42 PM By: Fredirick Maudlin MD FACS Entered By: Fredirick Maudlin on 11/27/2022 15:13:42 -------------------------------------------------------------------------------- [place] Details Patient Name: Date of Service: [facility], [name] 11/27/2022 11:15 A M Medical Record Number: [id number] Patient Account Number: [id number] Date of Birth/Sex: Treating RN: May 26, 1951 (72 y.o. [name], [name] Primary Care Aniqa Hare: [name] Other Clinician: Referring Jantz Main: Treating  Jenean Escandon/Extender: [name] Weeks in Treatment: 6 [place] reviewed with physician Active Inactive Nutrition Nursing Diagnoses: Impaired glucose control: actual or potential Goals: Patient/caregiver will maintain therapeutic glucose control Date Initiated: 10/12/2022 Target Resolution Date: 12/28/2022 Goal Status: Active Interventions: Provide education on elevated blood sugars and impact on wound healing Provide education on nutrition Treatment Activities: Dietary management education, guidance and counseling : 10/12/2022 Giving encouragement to exercise : 10/12/2022 Notes: Osteomyelitis Nursing Diagnoses: Infection: osteomyelitis Goals: Patient/caregiver will verbalize understanding of disease process and disease management Date Initiated: 10/12/2022 Target Resolution Date: 12/28/2022 Goal Status: Active Interventions: Assess for signs and symptoms of osteomyelitis resolution every visit Provide education on osteomyelitis Treatment Activities: MRI : 08/16/2022 Notes: Wound/Skin Impairment Nursing Diagnoses: Impaired tissue integrity Goals: Ulcer/skin breakdown will have a volume reduction of 30% by week 4 Date Initiated: 10/12/2022 Target Resolution Date: 12/28/2022 Goal Status: Active Interventions: ABANOUB, SHABAN ([id number]) [phone].pdf Page 4 of 6 Assess ulceration(s) every visit Provide education on smoking Treatment Activities: Skin care regimen initiated : 10/12/2022 Smoking cessation education : 10/12/2022 Notes: Electronic Signature(s) Signed: 11/27/2022 3:50:45 PM By: Rhae Hammock RN Entered By: Rhae Hammock on 11/27/2022 14:49:23 -------------------------------------------------------------------------------- Pain Assessment Details Patient Name: Date of Service: [facility], [name] 11/27/2022 11:15 A M Medical Record Number: [id number] Patient Account Number: [id number] Date of Birth/Sex:  Treating RN: [birth year] (72 y.o. [name], [name] Primary Care Holly Pring: [name] Other Clinician: Referring Shamarra Warda: Treating Gina Leblond/Extender: [name] Weeks in Treatment: 6 Active Problems Location of Pain Severity and Description of Pain Patient Has Paino No Site Locations Pain Management and Medication Current Pain Management: Electronic Signature(s) Signed: 11/27/2022 3:50:45 PM By: Rhae Hammock RN Entered By: Rhae Hammock on 11/27/2022 14:31:26 -------------------------------------------------------------------------------- Patient/Caregiver Education Details Patient Name: Date of Service: [name], [name] 2/26/2024andnbsp11:15 A M Medical Record Number: [id number] Patient Account Number: [id number] Date of Birth/Gender: Treating RN: [birth year] (72 y.o. [name] Primary Care Physician: [name] Other Clinician: Referring Physician: Treating Physician/Extender: Levin Erp in Treatment: 6 Education Assessment Education Provided To: Patient JOON, CALLISON ([id number]) [id number] Page 5 of 6 Education Topics Provided Wound/Skin Impairment: Methods: Explain/Verbal Responses: Reinforcements needed, State content correctly Electronic Signature(s) Signed: 11/27/2022 3:50:45 PM By: Rhae Hammock RN Entered By: Rhae Hammock on 11/27/2022 14:49:38 -------------------------------------------------------------------------------- Wound Assessment Details Patient Name: Date of Service: [facility] [name], [name] 11/27/2022 11:15 A M Medical Record Number: [id number] Patient  Account Number: [id number] Date of Birth/Sex: Treating RN: 1951-09-20 (72 y.o. [name], [name] Primary Care Karrin Eisenmenger: [name] Other Clinician: Referring Yichen Gilardi: Treating Ariza Evans/Extender: [name] Weeks in Treatment: 6 Wound Status Wound Number: 1 Primary Diabetic Wound/Ulcer of  the Lower Extremity Etiology: Wound Location: Right, Medial T Great oe Secondary Acute Osteomyelitis Wounding Event: Pressure Injury Etiology: Date Acquired: 08/16/2022 Wound Open Weeks Of Treatment: 6 Status: Clustered Wound: No Comorbid Cataracts, Hypertension, Type II Diabetes, Osteomyelitis, History: Neuropathy, Confinement Anxiety Photos Wound Measurements Length: (cm) 2 Width: (cm) 5 Depth: (cm) 0.4 Area: (cm) 7.854 Volume: (cm) 3.142 % Reduction in Area: -2756% % Reduction in Volume: -2756.4% Epithelialization: Small (1-33%) Tunneling: No Undermining: No Wound Description Classification: Grade 3 Wound Margin: Distinct, outline attached Exudate Amount: Medium Exudate Type: Serosanguineous Exudate Color: red, brown Foul Odor After Cleansing: No Slough/Fibrino Yes Wound Bed Granulation Amount: Small (1-33%) Exposed Structure Granulation Quality: Pink Fascia Exposed: No Necrotic Amount: Large (67-100%) Fat Layer (Subcutaneous Tissue) Exposed: Yes Necrotic Quality: Adherent Slough Tendon Exposed: Yes Muscle Exposed: No Joint Exposed: No Bone Exposed: Yes Periwound Skin Texture Texture Color No Abnormalities Noted: Yes No Abnormalities NotedLevin Pulling, Jamicah ([id number] Page 6 of 6 Moisture Temperature / Pain No Abnormalities Noted: Yes Temperature: No Abnormality Electronic Signature(s) Signed: 11/27/2022 3:50:45 PM By: Rhae Hammock RN Signed: 11/27/2022 4:59:48 PM By: Dellie Catholic RN Entered By: Dellie Catholic on 11/27/2022 14:36:47 -------------------------------------------------------------------------------- Vitals Details Patient Name: Date of Service: [facility] [name], [name] 11/27/2022 11:15 A M Medical Record Number: [id number] Patient Account Number: [id number] Date of Birth/Sex: Treating RN: Sep 25, 1951 (72 y.o. [name], [name] Primary Care Octavia Velador: [name] Other Clinician: Referring  Advith Martine: Treating Judithann Villamar/Extender: [name] Weeks in Treatment: 6 Vital Signs Time Taken: 14:23 Temperature (F): 97.3 Height (in): 72 Pulse (bpm): 92 Weight (lbs): 185 Respiratory Rate (breaths/min): 17 Body Mass Index (BMI): 25.1 Blood Pressure (mmHg): 134/84 Capillary Blood Glucose (mg/dl): 201 Reference Range: 80 - 120 mg / dl Electronic Signature(s) Signed: 11/27/2022 3:50:45 PM By: Rhae Hammock RN Entered By: Rhae Hammock on 11/27/2022 14:31:21

## 2022-11-28 NOTE — Progress Notes (Signed)
Argenta, Hurley ([id number]) [id number] Page 1 of 10 Visit Report for 11/27/2022 Chief Complaint Document Details Patient Name: Date of Service: [name], [name] 11/27/2022 11:15 A M Medical Record Number: [id number] Patient Account Number: [id number] Date of Birth/Sex: Treating RN: [birth year] (72 y.o. M) Primary Care Provider: [name] Other Clinician: Referring Provider: Treating Provider/Extender: [name] Weeks in Treatment: 6 Information Obtained from: Patient Chief Complaint Patients presents for treatment of an open diabetic ulcer on the right great toe Electronic Signature(s) Signed: 11/27/2022 3:13:50 PM By: Fredirick Maudlin MD FACS Entered By: Fredirick Maudlin on 11/27/2022 15:13:50 -------------------------------------------------------------------------------- Debridement Details Patient Name: Date of Service: [name], [name] 11/27/2022 11:15 A M Medical Record Number: [id number] Patient Account Number: [id number] Date of Birth/Sex: Treating RN: 1950-10-21 (72 y.o. [name], [name] Primary Care Provider: [name] Other Clinician: Referring Provider: Treating Provider/Extender: [name] Weeks in Treatment: 6 Debridement Performed for Assessment: Wound #1 Right,Medial T Great oe Performed By: Physician Fredirick Maudlin, MD Debridement Type: Debridement Severity of Tissue Pre Debridement: Fat layer exposed Level of Consciousness (Pre-procedure): Awake and Alert Pre-procedure Verification/Time Out Yes - 14:45 Taken: Start Time: 14:45 Pain Control: Lidocaine T Area Debrided (L x W): otal 2 (cm) x 5 (cm) = 10 (cm) Tissue and other material debrided: Viable, Non-Viable, Slough, Subcutaneous, Slough Level: Skin/Subcutaneous Tissue Debridement Description: Excisional Instrument: Curette Specimen: Tissue Culture Number of Specimens T aken: 1 Bleeding: Minimum Hemostasis Achieved: Pressure End  Time: 14:45 Procedural Pain: 0 Post Procedural Pain: 0 Response to Treatment: Procedure was tolerated well Level of Consciousness (Post- Awake and Alert procedure): Post Debridement Measurements of Total Wound Length: (cm) 2 Width: (cm) 5 Depth: (cm) 0.4 Volume: (cm) 3.142 Character of Wound/Ulcer Post Debridement: Improved Severity of Tissue Post Debridement: Fat layer exposed Post Procedure Diagnosis Same as Pre-procedure Electronic Signature(s) Signed: 11/27/2022 3:50:45 PM By: Rhae Hammock RN [name], [name] ([id number]) [id number] Page 2 of 10 Signed: 11/27/2022 5:09:54 PM By: Fredirick Maudlin MD FACS Entered By: Rhae Hammock on 11/27/2022 14:47:54 -------------------------------------------------------------------------------- HPI Details Patient Name: Date of Service: [name], [name] 11/27/2022 11:15 A M Medical Record Number: [id number] Patient Account Number: [id number] Date of Birth/Sex: Treating RN: [birth year] (72 y.o. M) Primary Care Provider: [name] Other Clinician: Referring Provider: Treating Provider/Extender: [name] Weeks in Treatment: 6 History of Present Illness HPI Description: ADMISSION 10/12/2022 This is a 72 year old type II diabetic (last hemoglobin A1c 7.7 in October 2023) with stage V chronic kidney disease and hypertension. He has been followed by podiatry for an ulcer on his right great toe. He has a history of prior toe and foot infections requiring amputation. In November 2023, he had an outpatient MRI that demonstrated osteomyelitis. He was [facility] to [name] and subsequently underwent irrigation and debridement of the toe with biopsy and placement of married 3 layer matrix. Cultures grew out MRSA and osteomyelitis was seen on pathology. He was followed by infectious disease and given a course of doxycycline and Augmentin. He completed this course at the end of  December. He has been evaluated by vascular surgery with formal ABIs as well as consultation. He does have adequate blood flow at least to the dorsalis pedis. He last saw podiatry on January 4 of this year where he underwent debridement. It was felt he would benefit from specialized wound care and he was referred to our center for further evaluation and management. On the dorsal aspect of his right great toe,  there is a circular ulcer. There is a fair amount of undermining from callus and dry skin. The surface is a bit pale and fibrotic, but there is no obvious necrosis. 10/19/2022: He has been approved by his insurance to undergo hyperbaric oxygen therapy. EKG has been obtained and is normal. We are still waiting on his chest x-ray. The wound is clean with just a little bit of periwound callus and slough on the surface. Tendon is exposed. 10/26/2022: Unfortunately, the required co-pay from his insurance is cost prohibitive for him and he will not be able to receive hyperbaric oxygen therapy. Some moisture got under the dry skin around his wound and lifted it off but there is good epithelialized tissue beneath this. The exposed tendon has deteriorated. 11/03/2022: After some discussion with the financial office, it has become feasible for the patient to undergo hyperbaric oxygen therapy and he has received his first treatments this week. The wound is a little bit smaller and the tissue has a better color to it, but there is still extensive nonviable subcutaneous tissue and slough present. 11/09/2022: The wound measured a little bit smaller today. There is still slough and nonviable subcu tissue and tendon present. 11/16/2022: The wound continues to contract. There is still substantial depth to it, however, and bone remains exposed at the base. This is also a (early) 30-day update for HBO. 11/27/2022: Unfortunately, the wound has deteriorated. It appears that the tissue has become macerated. There is a second  ulcer on the lateral aspect of his great toe. The nail is lifting off of the bed and there is wet skin lifting off of the underlying tissue. The initial wound is smaller but still has a fair amount of depth to it. The patient does report quite a bit more drainage since his last visit. Electronic Signature(s) Signed: 11/27/2022 3:15:48 PM By: Fredirick Maudlin MD FACS Entered By: Fredirick Maudlin on 11/27/2022 15:15:48 -------------------------------------------------------------------------------- Excision nail and nail matrix, partial or complete Details Patient Name: Date of ServiceJodene [name], [name] [name] 11/27/2022 11:15 A M Medical Record Number: [id number] Patient Account Number: [id number] Date of Birth/Sex: Treating RN: Sep 27, 1951 (72 y.o. [name] [name] Primary Care Provider: [name] Other Clinician: Referring Provider: Treating Provider/Extender: [name] Weeks in Treatment: 6 Procedure Performed for: Wound #1 Right,Medial T Great oe Performed By: Physician Fredirick Maudlin, MD Post Procedure Diagnosis Same as Pre-procedure Electronic Signature(s) Signed: 11/27/2022 3:50:45 PM By: Rhae Hammock RN Signed: 11/27/2022 5:09:54 PM By: Fredirick Maudlin MD FACS Entered By: Rhae Hammock on 11/27/2022 14:43:59 Krzyzanowski, [name] [name] ([id number].pdf Page 3 of 10 -------------------------------------------------------------------------------- Physical Exam Details Patient Name: Date of ServiceShannan Harper [name], [name] [name] 11/27/2022 11:15 A M Medical Record Number: [id number] Patient Account Number: [id number] Date of Birth/Sex: Treating RN: 1951/04/28 (73 y.o. M) Primary Care Provider: [name] Other Clinician: Referring Provider: Treating Provider/Extender: [name] Weeks in Treatment: 6 Constitutional . . . . no acute distress. Respiratory Normal work of breathing on room air. Notes 11/27/2022:  Unfortunately, the wound has deteriorated. It appears that the tissue has become macerated. There is a second ulcer on the lateral aspect of his great toe. The nail is lifting off of the bed and there is wet skin lifting off of the underlying tissue. The initial wound is smaller but still has a fair amount of depth to it. The patient does report quite a bit more drainage since his last visit. Electronic Signature(s) Signed: 11/27/2022 3:16:17 PM By: Fredirick Maudlin MD  FACS Entered By: Fredirick Maudlin on 11/27/2022 15:16:16 -------------------------------------------------------------------------------- Physician Orders Details Patient Name: Date of Service: [name], [name] [name] 11/27/2022 11:15 A M Medical Record Number: [id number] Patient Account Number: [id number] Date of Birth/Sex: Treating RN: [birth year] (72 y.o. [name] [name], [name] [name] Primary Care Provider: [name] Other Clinician: Referring Provider: Treating Provider/Extender: [name] Weeks in Treatment: 6 Verbal / Phone Orders: No Diagnosis Coding ICD-10 Coding Code Description L97.514 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.621 Type 2 diabetes mellitus with foot ulcer N18.5 Chronic kidney disease, stage 5 M86.9 Osteomyelitis, unspecified E11.42 Type 2 diabetes mellitus with diabetic polyneuropathy I10 Essential (primary) hypertension Follow-up Appointments ppointment in 1 week. - Dr. Celine Ahr Rm 1 Return A Other: - We will wait to see what the culture says before prescribing any antibiotics Anesthetic Wound #1 Right,Medial T Great oe (In clinic) Topical Lidocaine 4% applied to wound bed Bathing/ Shower/ Hygiene May shower and wash wound with soap and water. Off-Loading Wound #1 Right,Medial T Great oe Other: - Diabetic shoe Hyperbaric Oxygen Therapy Other - HBO eval (11/16/22) Wound Treatment Wound #1 - T Great oe Wound Laterality: Right, Medial Cleanser:  Soap and Water 1 x Per Day/30 Days Schwartzman, [name] [name] ([id number]) [phone].pdf Page 4 of 10 Discharge Instructions: May shower and wash wound with dial antibacterial soap and water prior to dressing change. Cleanser: Wound Cleanser 1 x Per Day/30 Days Discharge Instructions: Cleanse the wound with wound cleanser prior to applying a clean dressing using gauze sponges, not tissue or cotton balls. Peri-Wound Care: Zinc Oxide Ointment 30g tube 1 x Per Day/30 Days Discharge Instructions: Apply Zinc Oxide to periwound with each dressing change Topical: Gentamicin 1 x Per Day/30 Days Discharge Instructions: As directed by physician Prim Dressing: Hydrofera Blue Ready Transfer Foam, 2.5x2.5 (in/in) 1 x Per Day/30 Days ary Discharge Instructions: Apply directly to wound bed as directed Secondary Dressing: Woven Gauze Sponge, Non-Sterile 4x4 in 1 x Per Day/30 Days Discharge Instructions: Apply over primary dressing as directed. Secured With: Child psychotherapist, Sterile 2x75 (in/in) 1 x Per Day/30 Days Discharge Instructions: Secure with stretch gauze as directed. Secured With: 58M Medipore Public affairs consultant Surgical T 2x10 (in/yd) 1 x Per Day/30 Days ape Discharge Instructions: Secure with tape as directed. Laboratory naerobe culture (MICRO) Bacteria identified in Unspecified specimen by A LOINC Code: [id number] Convenience Name: Anaerobic culture Patient Medications llergies: losartan potassium, sildenafil A Notifications Medication Indication Start End gentamicin DOSE topical 0.1 % ointment - Apply to wound with dressing changes Electronic Signature(s) Signed: 11/27/2022 3:49:52 PM By: Fredirick Maudlin MD FACS Entered By: Fredirick Maudlin on 11/27/2022 15:49:51 -------------------------------------------------------------------------------- Problem List Details Patient Name: Date of Service: [facility], [name] 11/27/2022 11:15 A M Medical Record Number:  [id number] Patient Account Number: [id number] Date of Birth/Sex: Treating RN: 31-May-1951 (72 y.o. M) Primary Care Provider: [name] Other Clinician: Referring Provider: Treating Provider/Extender: [name] Weeks in Treatment: 6 Active Problems ICD-10 Encounter Code Description Active Date MDM Diagnosis L97.514 Non-pressure chronic ulcer of other 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 10/12/2022 No Yes E11.621 Type 2 diabetes mellitus with foot ulcer 10/12/2022 No Yes N18.5 Chronic kidney disease, stage 5 10/12/2022 No Yes M86.9 Osteomyelitis, unspecified 10/12/2022 No Yes Keagle, [name] [name] ([id number]) [phone].pdf Page 5 of 10 E11.42 Type 2 diabetes mellitus with diabetic polyneuropathy 10/12/2022 No Yes I10 Essential (primary) hypertension 10/12/2022 No Yes Inactive Problems Resolved Problems Electronic Signature(s) Signed: 11/27/2022 3:13:29 PM By:  Fredirick Maudlin MD FACS Entered By: Fredirick Maudlin on 11/27/2022 15:13:29 -------------------------------------------------------------------------------- Progress Note Details Patient Name: Date of Service: [name], [name] 11/27/2022 11:15 A M Medical Record Number: [id number] Patient Account Number: [id number] Date of Birth/Sex: Treating RN: [birth year] (72 y.o. M) Primary Care Provider: [name] Other Clinician: Referring Provider: Treating Provider/Extender: [name] Weeks in Treatment: 6 Subjective Chief Complaint Information obtained from Patient Patients presents for treatment of an open diabetic ulcer on the right great toe History of Present Illness (HPI) ADMISSION 10/12/2022 This is a 72 year old type II diabetic (last hemoglobin A1c 7.7 in October 2023) with stage V chronic kidney disease and hypertension. He has been followed by podiatry for an ulcer on his right great toe. He has a history of prior toe and foot infections requiring amputation. In  November 2023, he had an outpatient MRI that demonstrated osteomyelitis. He was admitted to the [name] for IV antibiotics and subsequently underwent irrigation and debridement of the toe with biopsy and placement of married 3 layer matrix. Cultures grew out MRSA and osteomyelitis was seen on pathology. He was followed by infectious disease and given a course of doxycycline and Augmentin. He completed this course at the end of December. He has been evaluated by vascular surgery with formal ABIs as well as consultation. He does have adequate blood flow at least to the dorsalis pedis. He last saw podiatry on January 4 of this year where he underwent debridement. It was felt he would benefit from specialized wound care and he was referred to our center for further evaluation and management. On the dorsal aspect of his right great toe, there is a circular ulcer. There is a fair amount of undermining from callus and dry skin. The surface is a bit pale and fibrotic, but there is no obvious necrosis. 10/19/2022: He has been approved by his insurance to undergo hyperbaric oxygen therapy. EKG has been obtained and is normal. We are still waiting on his chest x-ray. The wound is clean with just a little bit of periwound callus and slough on the surface. Tendon is exposed. 10/26/2022: Unfortunately, the required co-pay from his insurance is cost prohibitive for him and he will not be able to receive hyperbaric oxygen therapy. Some moisture got under the dry skin around his wound and lifted it off but there is good epithelialized tissue beneath this. The exposed tendon has deteriorated. 11/03/2022: After some discussion with the financial office, it has become feasible for the patient to undergo hyperbaric oxygen therapy and he has received his first treatments this week. The wound is a little bit smaller and the tissue has a better color to it, but there is still extensive nonviable subcutaneous tissue and slough  present. 11/09/2022: The wound measured a little bit smaller today. There is still slough and nonviable subcu tissue and tendon present. 11/16/2022: The wound continues to contract. There is still substantial depth to it, however, and bone remains exposed at the base. This is also a (early) 30-day update for HBO. 11/27/2022: Unfortunately, the wound has deteriorated. It appears that the tissue has become macerated. There is a second ulcer on the lateral aspect of his great toe. The nail is lifting off of the bed and there is wet skin lifting off of the underlying tissue. The initial wound is smaller but still has a fair amount of depth to it. The patient does report quite a bit more drainage since his last visit. Patient History Information obtained from Patient.  Family History Diabetes - Mother,Siblings, Heart Disease - Siblings, Hypertension - Siblings, Seizures - Father, Stroke - Siblings, No family history of Cancer, Hereditary Spherocytosis, Kidney Disease, Lung Disease, Thyroid Problems, Tuberculosis. Social History Former smoker, Marital Status - Married, Alcohol Use - Rarely, Drug Use - No History, Caffeine Use - Daily. [place], [name] [name] ([id number]) [id number] Page 6 of 10 Medical History Eyes Patient has history of Cataracts - rt eye Ear/Nose/Mouth/Throat Denies history of Chronic sinus problems/congestion Hematologic/Lymphatic Denies history of Anemia, Hemophilia, Human Immunodeficiency Virus, Lymphedema, Sickle Cell Disease Respiratory Denies history of Aspiration, Asthma, Chronic Obstructive Pulmonary Disease (COPD), Pneumothorax, Tuberculosis Cardiovascular Patient has history of Hypertension Gastrointestinal Denies history of Cirrhosis , Colitis, Crohnoos, Hepatitis A, Hepatitis B, Hepatitis C Endocrine Patient has history of Type II Diabetes Genitourinary Denies history of End Stage Renal Disease Immunological Denies history of Lupus  Erythematosus, Raynaudoos, Scleroderma Integumentary (Skin) Denies history of History of Burn Musculoskeletal Patient has history of Osteomyelitis - MRI confirmed (08/16/22) Denies history of Gout Neurologic Patient has history of Neuropathy Psychiatric Patient has history of Confinement Anxiety Denies history of Anorexia/bulimia Hospitalization/Surgery History - AV fistula place left upper arm 10/20/22. Medical A Surgical History Notes nd Genitourinary CKD IV Objective Constitutional no acute distress. Vitals Time Taken: 2:23 PM, Height: 72 in, Weight: 185 lbs, BMI: 25.1, Temperature: 97.3 F, Pulse: 92 bpm, Respiratory Rate: 17 breaths/min, Blood Pressure: 134/84 mmHg, Capillary Blood Glucose: 201 mg/dl. Respiratory Normal work of breathing on room air. General Notes: 11/27/2022: Unfortunately, the wound has deteriorated. It appears that the tissue has become macerated. There is a second ulcer on the lateral aspect of his great toe. The nail is lifting off of the bed and there is wet skin lifting off of the underlying tissue. The initial wound is smaller but still has a fair amount of depth to it. The patient does report quite a bit more drainage since his last visit. Integumentary (Hair, Skin) Wound #1 status is Open. Original cause of wound was Pressure Injury. The date acquired was: 08/16/2022. The wound has been in treatment 6 weeks. The wound is located on the Right,Medial T Great. The wound measures 2cm length x 5cm width x 0.4cm depth; 7.854cm^2 area and 3.142cm^3 volume. There is oe bone, tendon, and Fat Layer (Subcutaneous Tissue) exposed. There is no tunneling or undermining noted. There is a medium amount of serosanguineous drainage noted. The wound margin is distinct with the outline attached to the wound base. There is small (1-33%) pink granulation within the wound bed. There is a large (67-100%) amount of necrotic tissue within the wound bed including Adherent Slough.  The periwound skin appearance had no abnormalities noted for texture. The periwound skin appearance had no abnormalities noted for moisture. The periwound skin appearance had no abnormalities noted for color. Periwound temperature was noted as No Abnormality. Assessment Active Problems ICD-10 Non-pressure chronic ulcer of other 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 Type 2 diabetes mellitus with foot ulcer Chronic kidney disease, stage 5 Osteomyelitis, unspecified Type 2 diabetes mellitus with diabetic polyneuropathy Essential (primary) hypertension Salamone, [name] ([id number] Page 7 of 10 Procedures Wound #1 Pre-procedure diagnosis of Wound #1 is a Diabetic Wound/Ulcer of the Lower Extremity located on the Right,Medial T Great .Severity of Tissue Pre oe Debridement is: Fat layer exposed. There was a Excisional Skin/Subcutaneous Tissue Debridement with a total area of 10 sq cm performed by Fredirick Maudlin, MD. With the following instrument(s): Curette to remove Viable and Non-Viable tissue/material. Material removed  includes Subcutaneous Tissue and Slough and after achieving pain control using Lidocaine. 1 specimen was taken by a Tissue Culture and sent to the lab per facility protocol. A time out was conducted at 14:45, prior to the start of the procedure. A Minimum amount of bleeding was controlled with Pressure. The procedure was tolerated well with a pain level of 0 throughout and a pain level of 0 following the procedure. Post Debridement Measurements: 2cm length x 5cm width x 0.4cm depth; 3.142cm^3 volume. Character of Wound/Ulcer Post Debridement is improved. Severity of Tissue Post Debridement is: Fat layer exposed. Post procedure Diagnosis Wound #1: Same as Pre-Procedure Pre-procedure diagnosis of Wound #1 is a Diabetic Wound/Ulcer of the Lower Extremity located on the Right,Medial T Great . An Excision nail and nail matrix, oe partial or  complete procedure was performed by Fredirick Maudlin, MD. Post procedure Diagnosis Wound #1: Same as Pre-Procedure Plan Follow-up Appointments: Return Appointment in 1 week. - Dr. Celine Ahr Rm 1 Other: - We will wait to see what the culture says before prescribing any antibiotics Anesthetic: Wound #1 Right,Medial T Great: oe (In clinic) Topical Lidocaine 4% applied to wound bed Bathing/ Shower/ Hygiene: May shower and wash wound with soap and water. Off-Loading: Wound #1 Right,Medial T Great: oe Other: - Diabetic shoe Hyperbaric Oxygen Therapy: Other - HBO eval (11/16/22) Laboratory ordered were: Anaerobic culture The following medication(s) was prescribed: gentamicin topical 0.1 % ointment Apply to wound with dressing changes WOUND #1: - T Great Wound Laterality: Right, Medial oe Cleanser: Soap and Water 1 x Per Day/30 Days Discharge Instructions: May shower and wash wound with dial antibacterial soap and water prior to dressing change. Cleanser: Wound Cleanser 1 x Per Day/30 Days Discharge Instructions: Cleanse the wound with wound cleanser prior to applying a clean dressing using gauze sponges, not tissue or cotton balls. Peri-Wound Care: Zinc Oxide Ointment 30g tube 1 x Per Day/30 Days Discharge Instructions: Apply Zinc Oxide to periwound with each dressing change Topical: Gentamicin 1 x Per Day/30 Days Discharge Instructions: As directed by physician Prim Dressing: Hydrofera Blue Ready Transfer Foam, 2.5x2.5 (in/in) 1 x Per Day/30 Days ary Discharge Instructions: Apply directly to wound bed as directed Secondary Dressing: Woven Gauze Sponge, Non-Sterile 4x4 in 1 x Per Day/30 Days Discharge Instructions: Apply over primary dressing as directed. Secured With: Child psychotherapist, Sterile 2x75 (in/in) 1 x Per Day/30 Days Discharge Instructions: Secure with stretch gauze as directed. Secured With: 23M Medipore Public affairs consultant Surgical T 2x10 (in/yd) 1 x Per Day/30  Days ape Discharge Instructions: Secure with tape as directed. 11/27/2022: Unfortunately, the wound has deteriorated. It appears that the tissue has become macerated. There is a second ulcer on the lateral aspect of his great toe. The nail is lifting off of the bed and there is wet skin lifting off of the underlying tissue. The initial wound is smaller but still has a fair amount of depth to it. The patient does report quite a bit more drainage since his last visit. I used scissors and forceps to trim away the hanging dead skin. I excised the nail that was hanging loose and debrided the nail bed with a curette. I debrided slough and nonviable subcutaneous tissue from the wound. I then took a culture, concerned that infection may be contributing to the increased drainage and deterioration of his wound. I am going to wait for culture data before prescribing a systemic antibiotic but we will apply topical gentamicin to his wound and add  Hydrofera Blue in place of silver alginate with periwound zinc oxide to try and keep the tissue maceration to a minimum. He will continue his hyperbaric oxygen therapy. Follow-up in 1 week. Electronic Signature(s) Signed: 11/27/2022 4:14:04 PM By: Fredirick Maudlin MD FACS Previous Signature: 11/27/2022 3:50:04 PM Version By: Fredirick Maudlin MD FACS Entered By: Fredirick Maudlin on 11/27/2022 16:14:03 HxROS Details -------------------------------------------------------------------------------- [name] ([id number]) [id number] Page 8 of 10 Patient Name: Date of ServiceJodene [name], [name] [name] 11/27/2022 11:15 A M Medical Record Number: [id number] Patient Account Number: [id number] Date of Birth/Sex: Treating RN: January 26, 1951 (72 y.o. M) Primary Care Provider: [name] Other Clinician: Referring Provider: Treating Provider/Extender: [name] Weeks in Treatment: 6 Information Obtained From Patient Eyes Medical  History: Positive for: Cataracts - rt eye Ear/Nose/Mouth/Throat Medical History: Negative for: Chronic sinus problems/congestion Hematologic/Lymphatic Medical History: Negative for: Anemia; Hemophilia; Human Immunodeficiency Virus; Lymphedema; Sickle Cell Disease Respiratory Medical History: Negative for: Aspiration; Asthma; Chronic Obstructive Pulmonary Disease (COPD); Pneumothorax; Tuberculosis Cardiovascular Medical History: Positive for: Hypertension Gastrointestinal Medical History: Negative for: Cirrhosis ; Colitis; Crohns; Hepatitis A; Hepatitis B; Hepatitis C Endocrine Medical History: Positive for: Type II Diabetes Time with diabetes: 2000 Treated with: Insulin Blood sugar tested every day: Yes Tested : Genitourinary Medical History: Negative for: End Stage Renal Disease Past Medical History Notes: CKD IV Immunological Medical History: Negative for: Lupus Erythematosus; Raynauds; Scleroderma Integumentary (Skin) Medical History: Negative for: History of Burn Musculoskeletal Medical History: Positive for: Osteomyelitis - MRI confirmed (08/16/22) Negative for: Gout Neurologic Medical History: Positive for: Neuropathy Psychiatric [place], [name] [name] ([id number] Page 9 of 10 Medical History: Positive for: Confinement Anxiety Negative for: Anorexia/bulimia HBO Extended History Items Eyes: Cataracts Immunizations Pneumococcal Vaccine: Received Pneumococcal Vaccination: Yes Received Pneumococcal Vaccination On or After 60th Birthday: Yes Implantable Devices None Hospitalization / Surgery History Type of Hospitalization/Surgery AV fistula place left upper arm 10/20/22 Family and Social History Cancer: No; Diabetes: Yes - Mother,Siblings; Heart Disease: Yes - Siblings; Hereditary Spherocytosis: No; Hypertension: Yes - Siblings; Kidney Disease: No; Lung Disease: No; Seizures: Yes - Father; Stroke: Yes - Siblings; Thyroid  Problems: No; Tuberculosis: No; Former smoker; Marital Status - Married; Alcohol Use: Rarely; Drug Use: No History; Caffeine Use: Daily; Financial Concerns: No; Food, Clothing or Shelter Needs: No; Support System Lacking: No; Transportation Concerns: No Electronic Signature(s) Signed: 11/27/2022 5:09:54 PM By: Fredirick Maudlin MD FACS Entered By: Fredirick Maudlin on 11/27/2022 15:15:55 -------------------------------------------------------------------------------- SuperBill Details Patient Name: Date of Service: [facility], [name] [name] 11/27/2022 Medical Record Number: [id number] Patient Account Number: [id number] Date of Birth/Sex: Treating RN: Jul 23, 1951 (72 y.o. [name] [name], [name] [name] Primary Care Provider: [name] Other Clinician: Referring Provider: Treating Provider/Extender: [name] Weeks in Treatment: 6 Diagnosis Coding ICD-10 Codes Code Description [phone] 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.621 Type 2 diabetes mellitus with foot ulcer N18.5 Chronic kidney disease, stage 5 M86.9 Osteomyelitis, unspecified E11.42 Type 2 diabetes mellitus with diabetic polyneuropathy I10 Essential (primary) hypertension Facility Procedures : CPT4 Code: [id number] Description: [id number] - DEB SUBQ TISSUE 20 SQ CM/< ICD-10 Diagnosis Description L97.514 Non-pressure chronic ulcer of other 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 Modifier: Quantity: 1 : CPT4 Code: [id number] Description: [id number] - AVULSION NAIL, SIMPLE/SINGLE ICD-10 Diagnosis Description L97.514 Non-pressure chronic ulcer of other 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 Modifier: Quantity: 1 Physician Procedures : CPT4 Code Description Modifier [id number] - WC PHYS LEVEL 4 - EST PT 25 ICD-10  Diagnosis Description L97.514 Non-pressure chronic ulcer of other 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 Madani, [place] ([id number]) [phone]  L97.514  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.621 Type 2 diabetes mellitus with foot ulcer M86.9 Osteomyelitis, unspecified N18.5 Chronic kidney disease, stage 5 Quantity: 1 .pdf Page 10 of 10 : [id number] - WC PHYS SUBQ TISS 20 SQ CM 1 ICD-10 Diagnosis Description L97.514 Non-pressure chronic ulcer of other 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 Quantity: : [id number] - WC PHYS AVULSION NAILPLATE SIM/SING 1 [id number] Diagnosis Description L97.514 Non-pressure chronic ulcer of other 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 Quantity: Electronic Signature(s) Signed: 11/27/2022 4:14:58 PM By: Fredirick Maudlin MD FACS Previous Signature: 11/27/2022 3:50:45 PM Version By: Rhae Hammock RN Entered By: Fredirick Maudlin on 11/27/2022 16:14:58

## 2022-11-28 NOTE — Progress Notes (Signed)
[place], Jotham ([id number]) [id number] Page 1 of 2 Visit Report for 11/27/2022 Arrival Information Details Patient Name: Date of Service: [name], [name] 11/27/2022 12:00 PM Medical Record Number: [id number] Patient Account Number: [id number] Date of Birth/Sex: Treating RN: 11-Dec-1950 (72 y.o. [name] Primary Care Ameyah Bangura: [name] Other Clinician: Valeria Batman Referring Kimori Tartaglia: Treating Jaiyden Laur/Extender: [name] Weeks in Treatment: 6 Visit Information History Since Last Visit All ordered tests and consults were completed: Yes Patient Arrived: Ambulatory Added or deleted any medications: No Arrival Time: 11:55 Any new allergies or adverse reactions: No Accompanied By: None Had a fall or experienced change in No Transfer Assistance: None activities of daily living that may affect Patient Identification Verified: Yes risk of falls: Secondary Verification Process Completed: Yes Signs or symptoms of abuse/neglect since last visito No Patient Requires Transmission-Based Precautions: No Hospitalized since last visit: No Patient Has Alerts: No Implantable device outside of the clinic excluding No cellular tissue based products placed in the center since last visit: Pain Present Now: No Electronic Signature(s) Signed: 11/27/2022 2:43:27 PM By: Valeria Batman EMT Entered By: Valeria Batman on 11/27/2022 14:43:27 -------------------------------------------------------------------------------- Encounter Discharge Information Details Patient Name: Date of Service: [name], [name] 11/27/2022 12:00 PM Medical Record Number: [id number] Patient Account Number: [id number] Date of Birth/Sex: Treating RN: 08-20-1951 (72 y.o. [name] Primary Care Abraham Margulies: [name] Other Clinician: Valeria Batman Referring Viva Gallaher: Treating Raegyn Renda/Extender: [name] Weeks in Treatment: 6 Encounter  Discharge Information Items Discharge Condition: Stable Ambulatory Status: Cane Discharge Destination: Other (Note Required) Transportation: Private Auto Accompanied By: None Schedule Follow-up Appointment: Yes Clinical Summary of Care: Notes The patient had a Wound care appointment after his treatment today. Electronic Signature(s) Signed: 11/27/2022 2:50:38 PM By: Valeria Batman EMT Entered By: Valeria Batman on 11/27/2022 14:50:38 -------------------------------------------------------------------------------- Vitals Details Patient Name: Date of Service: [facility] [name], [name] 11/27/2022 12:00 PM Medical Record Number: [id number] Patient Account Number: [id number] Date of Birth/Sex: Treating RN: April 12, 1951 (72 y.o. [name] Primary Care Carolyn Sylvia: [name] Other Clinician: Valeria Batman Referring Marki Frede: Treating Nicolus Ose/Extender: [name] Weeks in Treatment: 6 [place], [name] [id number] Page 2 of 2 Vital Signs Time Taken: 12:06 Temperature (F): 97.9 Height (in): 72 Pulse (bpm): 101 Weight (lbs): 185 Respiratory Rate (breaths/min): 18 Body Mass Index (BMI): 25.1 Blood Pressure (mmHg): 156/69 Capillary Blood Glucose (mg/dl): 141 Reference Range: 80 - 120 mg / dl Electronic Signature(s) Signed: 11/27/2022 2:45:00 PM By: Valeria Batman EMT Entered By: Valeria Batman on 11/27/2022 14:45:00

## 2022-11-29 ENCOUNTER — Encounter (HOSPITAL_BASED_OUTPATIENT_CLINIC_OR_DEPARTMENT_OTHER): Payer: No Typology Code available for payment source | Admitting: General Surgery

## 2022-11-29 DIAGNOSIS — M86671 Other chronic osteomyelitis, right ankle and foot: Secondary | ICD-10-CM | POA: Diagnosis not present

## 2022-11-29 DIAGNOSIS — E11621 Type 2 diabetes mellitus with foot ulcer: Secondary | ICD-10-CM | POA: Diagnosis not present

## 2022-11-29 LAB — GLUCOSE, CAPILLARY
Glucose-Capillary: 157 mg/dL — ABNORMAL HIGH (ref 70–99)
Glucose-Capillary: 183 mg/dL — ABNORMAL HIGH (ref 70–99)

## 2022-11-29 NOTE — Progress Notes (Signed)
[place], [place] ([id number]) [id number] Page 1 of 1 Visit Report for 11/28/2022 SuperBill Details Patient Name: Date of Service: [name], [name] 11/28/2022 Medical Record Number: [id number] Patient Account Number: [id number] Date of Birth/Sex: Treating RN: January 14, 1951 (72 y.o. [name], [name] Primary Care Provider: [name] Other Clinician: Donavan Burnet Referring Provider: Treating Provider/Extender: [name] Weeks in Treatment: 6 Diagnosis Coding ICD-10 Codes Code Description [phone] 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.621 Type 2 diabetes mellitus with foot ulcer N18.5 Chronic kidney disease, stage 5 M86.9 Osteomyelitis, unspecified E11.42 Type 2 diabetes mellitus with diabetic polyneuropathy I10 Essential (primary) hypertension Facility Procedures CPT4 Code Description Modifier Quantity [id number]-(Facility Use Only) HBOT full body chamber, 90mn , 4 ICD-10 Diagnosis Description M86.9 Osteomyelitis, unspecified 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.621 Type 2 diabetes mellitus with foot ulcer Physician Procedures Quantity CPT4 Code Description Modifier [id number] PHYS HYPERBARIC OXYGEN THERAPY 1 ICD-10 Diagnosis Description M86.9 Osteomyelitis, unspecified 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

## 2022-11-29 NOTE — Progress Notes (Signed)
[place], Shakai ([id number]) [id number] Page 1 of 2 Visit Report for 11/28/2022 Arrival Information Details Patient Name: Date of Service: [name], [name] 11/28/2022 12:00 PM Medical Record Number: [id number] Patient Account Number: [id number] Date of Birth/Sex: Treating RN: August 11, 1951 (72 y.o. [name], [name] Primary Care Branon Sabine: [name] Other Clinician: Donavan Burnet Referring Mateja Dier: Treating Zilphia Kozinski/Extender: [name] Weeks in Treatment: 6 Visit Information History Since Last Visit All ordered tests and consults were completed: Yes Patient Arrived: [name] Added or deleted any medications: No Arrival Time: 11:49 Any new allergies or adverse reactions: No Accompanied By: self Had a fall or experienced change in No Transfer Assistance: None activities of daily living that may affect Patient Identification Verified: Yes risk of falls: Secondary Verification Process Completed: Yes Signs or symptoms of abuse/neglect since last visito No Patient Requires Transmission-Based Precautions: No Hospitalized since last visit: No Patient Has Alerts: No Implantable device outside of the clinic excluding No cellular tissue based products placed in the center since last visit: Pain Present Now: No Electronic Signature(s) Signed: 11/28/2022 3:28:34 PM By: Donavan Burnet CHT EMT BS , , Entered By: Donavan Burnet on 11/28/2022 15:28:34 -------------------------------------------------------------------------------- Encounter Discharge Information Details Patient Name: Date of Service: [name], [name] 11/28/2022 12:00 PM Medical Record Number: [id number] Patient Account Number: [id number] Date of Birth/Sex: Treating RN: 1950-10-17 (72 y.o. [name] Primary Care Chastidy Ranker: [name] Other Clinician: Donavan Burnet Referring Bralin Garry: Treating Atharv Barriere/Extender: [name] Weeks in Treatment:  6 Encounter Discharge Information Items Discharge Condition: Stable Ambulatory Status: Cane Discharge Destination: Home Transportation: Private Auto Accompanied By: self Schedule Follow-up Appointment: No Clinical Summary of Care: Electronic Signature(s) Signed: 11/28/2022 3:41:43 PM By: Donavan Burnet CHT EMT BS , , Previous Signature: 11/28/2022 3:41:12 PM Version By: Donavan Burnet CHT EMT BS , , Entered By: Donavan Burnet on 11/28/2022 15:41:42 -------------------------------------------------------------------------------- [place] Details Patient Name: Date of Service: [name], [name] 11/28/2022 12:00 PM Medical Record Number: [id number] Patient Account Number: [id number] Date of Birth/Sex: Treating RN: Aug 29, 1951 (72 y.o. [name] Primary Care Iliana Hutt: [name] Other Clinician: Donavan Burnet Referring Savanah Bayles: Treating Kerry-Anne Mezo/Extender: [name] Weeks in Treatment: 6 Vital Signs Time Taken: 11:46 Temperature (F): 97.5 [name], [name] ([id number]) [id number] Page 2 of 2 Height (in): 72 Pulse (bpm): 107 Weight (lbs): 185 Respiratory Rate (breaths/min): 18 Body Mass Index (BMI): 25.1 Blood Pressure (mmHg): 153/66 Capillary Blood Glucose (mg/dl): 223 Reference Range: 80 - 120 mg / dl Electronic Signature(s) Signed: 11/28/2022 3:33:46 PM By: Donavan Burnet CHT EMT BS , , Entered By: Donavan Burnet on 11/28/2022 15:33:46

## 2022-11-29 NOTE — Progress Notes (Signed)
[place], Arek ([id number]) [id number] Page 1 of 2 Visit Report for 11/28/2022 HBO Details Patient Name: Date of Service: [name], [name] 11/28/2022 12:00 PM Medical Record Number: [id number] Patient Account Number: [id number] Date of Birth/Sex: Treating RN: 1951-02-25 (72 y.o. 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 HBO Treatment Details Treatment Number: 19 Patient Type: Outpatient Chamber Type: Monoplace Chamber Serial #: [id number] Treatment Protocol: 2.0 ATA with 90 minutes oxygen, and no air breaks Treatment Details Compression Rate Down: 2.0 psi / minute De-Compression Rate Up: 2.0 psi / minute Air breaks and breathing Decompress Decompress Compress Tx Pressure Begins Reached periods Begins Ends (leave unused spaces blank) Chamber Pressure (ATA 1 2 ------2 1 ) Clock Time (24 hr) 12:08 12:16 - - - - - - 13:46 13:54 Treatment Length: 106 (minutes) Treatment Segments: 4 Vital Signs Capillary Blood Glucose Reference Range: 80 - 120 mg / dl HBO Diabetic Blood Glucose Intervention Range: <131 mg/dl or >249 mg/dl Type: Time Vitals Blood Pulse: Respiratory Temperature: Capillary Blood Glucose Pulse Action Taken: Pressure: Rate: Glucose (mg/dl): Meter #: Oximetry (%) Taken: Pre 11:46 153/66 107 18 97.5 223 1 asymptomatic for tachycardia Post 13:59 141/61 91 18 98.2 246 1 none per protocol Treatment Response Treatment Toleration: Well Treatment Completion Status: Treatment Completed without Adverse Event Physician HBO Attestation: I certify that I supervised this HBO  treatment in accordance with Medicare guidelines. A trained emergency response team is readily available per Yes hospital policies and procedures. Continue HBOT as ordered. Yes Electronic Signature(s) Signed: 11/28/2022 3:57:43 PM By: Fredirick Maudlin MD FACS Previous Signature: 11/28/2022 3:38:16 PM Version By: Donavan Burnet CHT EMT BS , , Entered By: Fredirick Maudlin on 11/28/2022 15:57:43 -------------------------------------------------------------------------------- HBO Safety Checklist Details Patient Name: Date of Service: [facility], [name] 11/28/2022 12:00 PM Medical Record Number: [id number] Patient Account Number: [id number] Date of Birth/Sex: Treating RN: 11-01-1950 (72 y.o. [name] Primary Care Dnya Hickle: [name] Other Clinician: Donavan Burnet Referring Diksha Tagliaferro: Treating Elbia Paro/Extender: [name] [place], [name] ([id number]) [id number] Page 2 of 2 Weeks in Treatment: 6 HBO Safety Checklist Items Safety Checklist Consent Form Signed Patient voided / foley secured and emptied When did you last eato 1130 Last dose of injectable or oral agent last pm Ostomy pouch emptied and vented if applicable NA All implantable devices assessed, documented and approved NA Intravenous access site secured and place NA Valuables secured Linens and cotton and cotton/polyester blend (less than 51% polyester) Personal oil-based products / skin lotions / body lotions removed Wigs or hairpieces removed NA Smoking or tobacco materials removed NA Books / newspapers / magazines / loose paper removed Cologne, aftershave, perfume and deodorant removed Jewelry removed (may wrap wedding band) Make-up removed NA Hair care products removed Battery operated devices (external) removed Heating patches and chemical warmers removed Titanium eyewear removed Nail polish cured greater than 10 hours NA Casting material cured greater than  10 hours NA Hearing aids removed NA Loose dentures or partials removed dentures removed Prosthetics have been removed NA Patient demonstrates correct use of air break device (if applicable) Patient concerns have been addressed Patient grounding bracelet on and cord attached to chamber Specifics for Inpatients (complete in addition to above) Medication sheet sent  with patient NA Intravenous medications needed or due during therapy sent with patient NA Drainage tubes (e.g. nasogastric tube or chest tube secured and vented) NA Endotracheal or Tracheotomy tube secured NA Cuff deflated of air and inflated with saline NA Airway suctioned NA Notes Paper version used prior to treatment start. Electronic Signature(s) Signed: 11/28/2022 3:35:26 PM By: Donavan Burnet CHT EMT BS , , Entered By: Donavan Burnet on 11/28/2022 15:35:26

## 2022-11-30 ENCOUNTER — Encounter (HOSPITAL_BASED_OUTPATIENT_CLINIC_OR_DEPARTMENT_OTHER): Payer: No Typology Code available for payment source | Admitting: General Surgery

## 2022-11-30 ENCOUNTER — Ambulatory Visit (HOSPITAL_BASED_OUTPATIENT_CLINIC_OR_DEPARTMENT_OTHER): Payer: No Typology Code available for payment source | Admitting: General Surgery

## 2022-11-30 DIAGNOSIS — M86671 Other chronic osteomyelitis, right ankle and foot: Secondary | ICD-10-CM | POA: Diagnosis not present

## 2022-11-30 DIAGNOSIS — E11621 Type 2 diabetes mellitus with foot ulcer: Secondary | ICD-10-CM | POA: Diagnosis not present

## 2022-11-30 LAB — GLUCOSE, CAPILLARY
Glucose-Capillary: 155 mg/dL — ABNORMAL HIGH (ref 70–99)
Glucose-Capillary: 194 mg/dL — ABNORMAL HIGH (ref 70–99)

## 2022-11-30 NOTE — Progress Notes (Signed)
[name], [name] ([id number]) [id number] Page 1 of 2 Visit Report for 11/29/2022 HBO Details Patient Name: Date of Service: [name], [name] 11/29/2022 12:00 PM Medical Record Number: [id number] Patient Account Number: [id number] Date of Birth/Sex: Treating RN: 02-Apr-1951 (72 y.o. 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 HBO Treatment Details Treatment Number: 20 Patient Type: Outpatient Chamber Type: Monoplace Chamber Serial #: [id number] Treatment Protocol: 2.0 ATA with 90 minutes oxygen, and no air breaks Treatment Details Compression Rate Down: 2.0 psi / minute De-Compression Rate Up: 2.0 psi / minute Air breaks and breathing Decompress Decompress Compress Tx Pressure Begins Reached periods Begins Ends (leave unused spaces blank) Chamber Pressure (ATA 1 2 ------2 1 ) Clock Time (24 hr) 12:23 12:36 - - - - - - 14:06 14:13 Treatment Length: 110 (minutes) Treatment Segments: 4 Vital Signs Capillary Blood Glucose Reference Range: 80 - 120 mg / dl HBO Diabetic Blood Glucose Intervention Range: <131 mg/dl or >249 mg/dl Time Vitals Blood Respiratory Capillary Blood Glucose Pulse Action Type: Pulse: Temperature: Taken: Pressure: Rate: Glucose (mg/dl): Meter #: Oximetry (%) Taken: Pre 11:52 141/62 91 18 157 Post 14:20 142/61 91 18 97.7 183 Treatment Response Treatment Toleration: Well Treatment Completion Status: Treatment Completed without Adverse Event Physician HBO Attestation: I certify that I supervised this HBO treatment in accordance with Medicare guidelines. A  trained emergency response team is readily available per Yes hospital policies and procedures. Continue HBOT as ordered. Yes Electronic Signature(s) Signed: 11/29/2022 4:15:38 PM By: Fredirick Maudlin MD FACS Previous Signature: 11/29/2022 3:16:26 PM Version By: Valeria Batman EMT Entered By: Fredirick Maudlin on 11/29/2022 16:15:38 -------------------------------------------------------------------------------- HBO Safety Checklist Details Patient Name: Date of Service: [facility], [name] 11/29/2022 12:00 PM Medical Record Number: [id number] Patient Account Number: [id number] Date of Birth/Sex: Treating RN: March 19, 1951 (72 y.o. [name] Primary Care Bexleigh Theriault: [name] Other Clinician: Valeria Batman Referring Flem Enderle: Treating Bertine Schlottman/Extender: [name] [place], [name] ([id number]) [id number] Page 2 of 2 Weeks in Treatment: 6 HBO Safety Checklist Items Safety Checklist Consent Form Signed Patient voided / foley secured and emptied When did you last eato 1100 Last dose of injectable or oral agent last night Ostomy pouch emptied and vented if applicable NA All implantable devices assessed, documented and approved Libre2 Dialysis shunt Intravenous access site secured and place NA Valuables secured Linens and cotton and cotton/polyester blend (less than 51% polyester) Personal oil-based products / skin lotions / body lotions removed Wigs or hairpieces removed NA Smoking or tobacco materials removed Books / newspapers / magazines / loose paper removed Cologne, aftershave, perfume and deodorant removed Jewelry removed (may wrap wedding band) Make-up removed NA Hair care products removed Battery operated devices (external) removed Heating patches and chemical warmers removed Titanium eyewear removed NA Nail polish cured greater than 10 hours NA Casting material cured greater than 10 hours NA Hearing aids removed NA Loose  dentures or partials removed removed by patient Prosthetics have been removed NA Patient demonstrates correct use of air break device (if applicable) Patient concerns have been addressed Patient grounding bracelet on and cord attached to chamber Specifics for Inpatients (complete in addition to above) Medication sheet sent with patient NA Intravenous medications needed or due during therapy  sent with patient NA Drainage tubes (e.g. nasogastric tube or chest tube secured and vented) NA Endotracheal or Tracheotomy tube secured NA Cuff deflated of air and inflated with saline NA Airway suctioned NA Notes The safety checklist was done before the treatment was started. Electronic Signature(s) Signed: 11/29/2022 3:12:38 PM By: Valeria Batman EMT Entered By: Valeria Batman on 11/29/2022 15:12:37

## 2022-11-30 NOTE — Progress Notes (Signed)
[place], Farris ([id number]) [id number] Page 1 of 2 Visit Report for 11/29/2022 Problem List Details Patient Name: Date of Service: [name], [name] 11/29/2022 12:00 PM Medical Record Number: [id number] Patient Account Number: [id number] Date of Birth/Sex: Treating RN: 09-26-1951 (72 y.o. 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.621 Type 2 diabetes mellitus with foot ulcer 10/12/2022 No Yes N18.5 Chronic kidney disease, stage 5 10/12/2022 No Yes M86.9 Osteomyelitis, unspecified 10/12/2022 No Yes E11.42 Type 2 diabetes mellitus with diabetic polyneuropathy 10/12/2022 No Yes I10 Essential (primary) hypertension 10/12/2022 No Yes Inactive Problems Resolved Problems Electronic Signature(s) Signed: 11/29/2022 3:17:13 PM By: Valeria Batman EMT Signed: 11/29/2022 4:13:19 PM By: Fredirick Maudlin MD FACS Entered By: Valeria Batman on 11/29/2022 15:17:12 -------------------------------------------------------------------------------- SuperBill Details Patient Name: Date of Service: [name], [name] 11/29/2022 Medical Record Number: [id number] Patient Account Number: [id number] Date of Birth/Sex: Treating RN: Oct 20, 1950 (72 y.o. 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.621 Type 2 diabetes mellitus with foot ulcer Savino, Tymar ([id number])  [phone].pdf Page 2 of 2 N18.5 Chronic kidney disease, stage 5 M86.9 Osteomyelitis, unspecified E11.42 Type 2 diabetes mellitus with diabetic polyneuropathy I10 Essential (primary) hypertension Facility Procedures : CPT4 Code Description: [id number] G0277-(Facility Use Only) HBOT full body chamber, 4mn , ICD-10 Diagnosis Description M86.9 Osteomyelitis, unspecified L97.514 Non-pressure chronic ulcer of other part of right foo E11.42 Type 2 diabetes mellitus with  diabetic polyneuropathy E11.621 Type 2 diabetes mellitus with foot ulcer Modifier: t with necrosis Quantity: 4 of bone Physician Procedures : CPT4 Code Description Modifier [id number] PHYS HYPERBARIC OXYGEN THERAPY ICD-10 Diagnosis Description M86.9 Osteomyelitis, unspecified 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

## 2022-11-30 NOTE — Progress Notes (Signed)
[place], Tyrone ([name]) [id number] Page 1 of 2 Visit Report for 11/29/2022 Arrival Information Details Patient Name: Date of Service: [name], [name] [name] 11/29/2022 12:00 PM Medical Record Number: [name] Patient Account Number: [id number] Date of Birth/Sex: Treating RN: [name] (72 y.o. [name] Primary Care Izik Bingman: [name] Other Clinician: Valeria Batman Referring Wilmoth Rasnic: Treating Levis Nazir/Extender: [name] Weeks in Treatment: 6 Visit Information History Since Last Visit All ordered tests and consults were completed: Yes Patient Arrived: [name] Added or deleted any medications: No Arrival Time: 11:23 Any new allergies or adverse reactions: No Accompanied By: None Had a fall or experienced change in No Transfer Assistance: None activities of daily living that may affect Patient Identification Verified: Yes risk of falls: Secondary Verification Process Completed: Yes Signs or symptoms of abuse/neglect since last visito No Patient Requires Transmission-Based Precautions: No Hospitalized since last visit: No Patient Has Alerts: No Implantable device outside of the clinic excluding No cellular tissue based products placed in the center since last visit: Pain Present Now: No Electronic Signature(s) Signed: 11/29/2022 3:11:02 PM By: Valeria Batman EMT Entered By: Valeria Batman on 11/29/2022 15:11:02 -------------------------------------------------------------------------------- Encounter Discharge Information Details Patient Name: Date of Service: [facility] [name], [name] 11/29/2022 12:00 PM Medical Record Number: [name] Patient Account Number: [id number] Date of Birth/Sex: Treating RN: [name] (72 y.o. [name] Primary Care Aaliyah Gavel: [name] Other Clinician: Valeria Batman Referring Daniya Aramburo: Treating Marji Kuehnel/Extender: [name] Weeks in Treatment: 6 Encounter  Discharge Information Items Discharge Condition: Stable Ambulatory Status: Cane Discharge Destination: Home Transportation: Private Auto Accompanied By: None Schedule Follow-up Appointment: Yes Clinical Summary of Care: Electronic Signature(s) Signed: 11/29/2022 3:17:42 PM By: Valeria Batman EMT Entered By: Valeria Batman on 11/29/2022 15:17:42 -------------------------------------------------------------------------------- Vitals Details Patient Name: Date of Service: [facility] [name], [name] 11/29/2022 12:00 PM Medical Record Number: [name] Patient Account Number: [id number] Date of Birth/Sex: Treating RN: [name], [name] (72 y.o. [name] Primary Care Emberlee Sortino: [name] Other Clinician: Valeria Batman Referring Savior Himebaugh: Treating Annalena Piatt/Extender: [name] Weeks in Treatment: 6 Vital Signs Time Taken: 11:52 Temperature (F): 141/62 [name], [name] ([name]) [id number] Page 2 of 2 Height (in): 72 Pulse (bpm): 91 Weight (lbs): 185 Respiratory Rate (breaths/min): 18 Body Mass Index (BMI): 25.1 Blood Pressure (mmHg): 141/62 Capillary Blood Glucose (mg/dl): 157 Reference Range: 80 - 120 mg / dl Electronic Signature(s) Signed: 11/29/2022 3:11:34 PM By: Valeria Batman EMT Entered By: Valeria Batman on 11/29/2022 15:11:34

## 2022-12-01 ENCOUNTER — Encounter (HOSPITAL_BASED_OUTPATIENT_CLINIC_OR_DEPARTMENT_OTHER): Payer: No Typology Code available for payment source | Attending: General Surgery | Admitting: General Surgery

## 2022-12-01 DIAGNOSIS — I120 Hypertensive chronic kidney disease with stage 5 chronic kidney disease or end stage renal disease: Secondary | ICD-10-CM | POA: Insufficient documentation

## 2022-12-01 DIAGNOSIS — Z87891 Personal history of nicotine dependence: Secondary | ICD-10-CM | POA: Diagnosis not present

## 2022-12-01 DIAGNOSIS — N185 Chronic kidney disease, stage 5: Secondary | ICD-10-CM | POA: Insufficient documentation

## 2022-12-01 DIAGNOSIS — E11621 Type 2 diabetes mellitus with foot ulcer: Secondary | ICD-10-CM | POA: Diagnosis not present

## 2022-12-01 DIAGNOSIS — E1122 Type 2 diabetes mellitus with diabetic chronic kidney disease: Secondary | ICD-10-CM | POA: Diagnosis not present

## 2022-12-01 DIAGNOSIS — Z09 Encounter for follow-up examination after completed treatment for conditions other than malignant neoplasm: Secondary | ICD-10-CM | POA: Diagnosis not present

## 2022-12-01 DIAGNOSIS — M869 Osteomyelitis, unspecified: Secondary | ICD-10-CM | POA: Diagnosis not present

## 2022-12-01 DIAGNOSIS — E1142 Type 2 diabetes mellitus with diabetic polyneuropathy: Secondary | ICD-10-CM | POA: Diagnosis not present

## 2022-12-01 DIAGNOSIS — L97514 Non-pressure chronic ulcer of other 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: Secondary | ICD-10-CM | POA: Diagnosis not present

## 2022-12-01 DIAGNOSIS — M86671 Other chronic osteomyelitis, right ankle and foot: Secondary | ICD-10-CM | POA: Diagnosis not present

## 2022-12-01 LAB — GLUCOSE, CAPILLARY
Glucose-Capillary: 185 mg/dL — ABNORMAL HIGH (ref 70–99)
Glucose-Capillary: 226 mg/dL — ABNORMAL HIGH (ref 70–99)

## 2022-12-02 NOTE — Progress Notes (Signed)
[place], Franck ([id number]) [id number] Page 1 of 2 Visit Report for 11/30/2022 Problem List Details Patient Name: Date of Service: [name], [name] 11/30/2022 12:00 PM Medical Record Number: [id number] Patient Account Number: [id number] Date of Birth/Sex: Treating RN: Jul 08, 1951 (72 y.o. 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 E11.621 Type 2 diabetes mellitus with foot ulcer 10/12/2022 No Yes N18.5 Chronic kidney disease, stage 5 10/12/2022 No Yes M86.9 Osteomyelitis, unspecified 10/12/2022 No Yes E11.42 Type 2 diabetes mellitus with diabetic polyneuropathy 10/12/2022 No Yes I10 Essential (primary) hypertension 10/12/2022 No Yes Inactive Problems Resolved Problems Electronic Signature(s) Signed: 11/30/2022 2:27:24 PM By: Valeria Batman EMT Signed: 11/30/2022 3:36:47 PM By: Fredirick Maudlin MD FACS Entered By: Valeria Batman on 11/30/2022 14:27:23 -------------------------------------------------------------------------------- SuperBill Details Patient Name: Date of Service: [facility] [name], [name] 11/30/2022 Medical Record Number: [id number] Patient Account Number: [id number] Date of Birth/Sex: Treating RN: [birth year] (72 y.o. 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.621 Type 2 diabetes mellitus with foot ulcer [name], [name] ([id number])  [id number].pdf Page 2 of 2 N18.5 Chronic kidney disease, stage 5 M86.9 Osteomyelitis, unspecified E11.42 Type 2 diabetes mellitus with diabetic polyneuropathy I10 Essential (primary) hypertension Facility Procedures : CPT4 Code Description: [id number] G0277-(Facility Use Only) HBOT full body chamber, 10mn , ICD-10 Diagnosis Description M86.9 Osteomyelitis, unspecified L97.514 Non-pressure chronic ulcer of other part of right foo E11.42 Type 2 diabetes mellitus with  diabetic polyneuropathy E11.621 Type 2 diabetes mellitus with foot ulcer Modifier: t with necrosis Quantity: 4 of bone Physician Procedures : CPT4 Code Description Modifier [id number] PHYS HYPERBARIC OXYGEN THERAPY ICD-10 Diagnosis Description M86.9 Osteomyelitis, unspecified 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

## 2022-12-02 NOTE — Progress Notes (Signed)
[place], Abubakr ([id number]) [id number] Page 1 of 2 Visit Report for 11/30/2022 HBO Details Patient Name: Date of Service: [name], [name] 11/30/2022 12:00 PM Medical Record Number: [id number] Patient Account Number: [id number] Date of Birth/Sex: Treating RN: 1951/03/13 (72 y.o. 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 HBO Treatment Details Treatment Number: 21 Patient Type: Outpatient Chamber Type: Monoplace Chamber Serial #: [id number] Treatment Protocol: 2.0 ATA with 90 minutes oxygen, and no air breaks Treatment Details Compression Rate Down: 2.0 psi / minute De-Compression Rate Up: 2.0 psi / minute Air breaks and breathing Decompress Decompress Compress Tx Pressure Begins Reached periods Begins Ends (leave unused spaces blank) Chamber Pressure (ATA 1 2 ------2 1 ) Clock Time (24 hr) 11:46 11:54 - - - - - - 13:24 13:32 Treatment Length: 106 (minutes) Treatment Segments: 4 Vital Signs Capillary Blood Glucose Reference Range: 80 - 120 mg / dl HBO Diabetic Blood Glucose Intervention Range: <131 mg/dl or >249 mg/dl Time Vitals Blood Respiratory Capillary Blood Glucose Pulse Action Type: Pulse: Temperature: Taken: Pressure: Rate: Glucose (mg/dl): Meter #: Oximetry (%) Taken: Pre 11:32 172/78 109 18 98.2 155 Post 13:36 137/58 72 18 97.9 194 Treatment Response Treatment Toleration: Well Treatment Completion Status: Treatment Completed without Adverse Event Physician HBO Attestation: I certify that I supervised this HBO treatment in accordance with Medicare guidelines. A  trained emergency response team is readily available per Yes hospital policies and procedures. Continue HBOT as ordered. Yes Electronic Signature(s) Signed: 11/30/2022 3:56:01 PM By: Fredirick Maudlin MD FACS Previous Signature: 11/30/2022 2:26:46 PM Version By: Valeria Batman EMT Entered By: Fredirick Maudlin on 11/30/2022 15:56:01 -------------------------------------------------------------------------------- HBO Safety Checklist Details Patient Name: Date of Service: [name], [name] 11/30/2022 12:00 PM Medical Record Number: [id number] Patient Account Number: [id number] Date of Birth/Sex: Treating RN: [birth year] (72 y.o. [name] Session Primary Care Kelseigh Diver: [name] Other Clinician: Valeria Batman Referring Adylene Dlugosz: Treating Dellis Voght/Extender: [name] [name], [name] [id number] Page 2 of 2 Weeks in Treatment: 7 HBO Safety Checklist Items Safety Checklist Consent Form Signed Patient voided / foley secured and emptied When did you last eato 1100 Last dose of injectable or oral agent 0800 Ostomy pouch emptied and vented if applicable NA All implantable devices assessed, documented and approved Libre2 Dialysis shunt Intravenous access site secured and place NA Valuables secured Linens and cotton and cotton/polyester blend (less than 51% polyester) Personal oil-based products / skin lotions / body lotions removed Wigs or hairpieces removed NA Smoking or tobacco materials removed Books / newspapers / magazines / loose paper removed Cologne, aftershave, perfume and deodorant removed Jewelry removed (may wrap wedding band) Make-up removed NA Hair care products removed Battery operated devices (external) removed Heating patches and chemical warmers removed Titanium eyewear removed NA Nail polish cured greater than 10 hours NA Casting material cured greater than 10 hours NA Hearing aids removed NA Loose dentures  or partials removed removed by patient Prosthetics have been removed NA Patient demonstrates correct use of air break device (if applicable) Patient concerns have been addressed Patient grounding bracelet on and cord attached to chamber Specifics for Inpatients (complete in addition to above) Medication sheet sent with patient NA Intravenous medications needed or due during therapy  sent with patient NA Drainage tubes (e.g. nasogastric tube or chest tube secured and vented) NA Endotracheal or Tracheotomy tube secured NA Cuff deflated of air and inflated with saline NA Airway suctioned NA Notes The safety checklist was done before the treatment was started. Electronic Signature(s) Signed: 11/30/2022 1:59:48 PM By: Valeria Batman EMT Entered By: Valeria Batman on 11/30/2022 13:59:48

## 2022-12-02 NOTE — Progress Notes (Signed)
[place], Aaran ([id number]) [id number] Page 1 of 2 Visit Report for 11/30/2022 Arrival Information Details Patient Name: Date of Service: [name], [name] [name] 11/30/2022 12:00 PM Medical Record Number: [id number] Patient Account Number: [id number] Date of Birth/Sex: Treating RN: August 06, 1951 (72 y.o. [name] Primary Care Visente Kirker: [name] Other Clinician: Valeria Batman Referring Shelvia Fojtik: Treating Vitaly Wanat/Extender: [name] Weeks in Treatment: 7 Visit Information History Since Last Visit All ordered tests and consults were completed: Yes Patient Arrived: [name] Added or deleted any medications: No Arrival Time: 11:28 Any new allergies or adverse reactions: No Accompanied By: None Had a fall or experienced change in No Transfer Assistance: None activities of daily living that may affect Patient Identification Verified: Yes risk of falls: Secondary Verification Process Completed: Yes Signs or symptoms of abuse/neglect since last visito No Patient Requires Transmission-Based Precautions: No Hospitalized since last visit: No Patient Has Alerts: No Implantable device outside of the clinic excluding No cellular tissue based products placed in the center since last visit: Pain Present Now: No Electronic Signature(s) Signed: 11/30/2022 1:57:15 PM By: Valeria Batman EMT Entered By: Valeria Batman on 11/30/2022 13:57:15 -------------------------------------------------------------------------------- Encounter Discharge Information Details Patient Name: Date of Service: [facility] [name], [name] 11/30/2022 12:00 PM Medical Record Number: [id number] Patient Account Number: [id number] Date of Birth/Sex: Treating RN: Apr 14, 1951 (72 y.o. [name] Primary Care Tamantha Saline: [name] Other Clinician: Valeria Batman Referring Youssef Footman: Treating Yatzari Jonsson/Extender: [name] Weeks in Treatment: 7 Encounter Discharge  Information Items Discharge Condition: Stable Ambulatory Status: Cane Discharge Destination: Home Transportation: Private Auto Accompanied By: None Schedule Follow-up Appointment: Yes Clinical Summary of Care: Electronic Signature(s) Signed: 11/30/2022 2:27:56 PM By: Valeria Batman EMT Entered By: Valeria Batman on 11/30/2022 14:27:56 -------------------------------------------------------------------------------- Vitals Details Patient Name: Date of Service: [name] [name], [name] 11/30/2022 12:00 PM Medical Record Number: [id number] Patient Account Number: [id number] Date of Birth/Sex: Treating RN: 11/04/1950 (72 y.o. [name] Primary Care Monserratt Knezevic: [name] Other Clinician: Valeria Batman Referring Ryken Paschal: Treating Delos Klich/Extender: [name] Weeks in Treatment: 7 Vital Signs Time Taken: 11:32 Temperature (F): 98.2 [name], [name] ([id number]) [id number] Page 2 of 2 Height (in): 72 Pulse (bpm): 109 Weight (lbs): 185 Respiratory Rate (breaths/min): 18 Body Mass Index (BMI): 25.1 Blood Pressure (mmHg): 172/78 Capillary Blood Glucose (mg/dl): 155 Reference Range: 80 - 120 mg / dl Electronic Signature(s) Signed: 11/30/2022 1:58:39 PM By: Valeria Batman EMT Entered By: Valeria Batman on 11/30/2022 13:58:39

## 2022-12-04 ENCOUNTER — Encounter (HOSPITAL_BASED_OUTPATIENT_CLINIC_OR_DEPARTMENT_OTHER): Payer: No Typology Code available for payment source | Admitting: General Surgery

## 2022-12-04 DIAGNOSIS — E11621 Type 2 diabetes mellitus with foot ulcer: Secondary | ICD-10-CM | POA: Diagnosis not present

## 2022-12-04 DIAGNOSIS — M86671 Other chronic osteomyelitis, right ankle and foot: Secondary | ICD-10-CM | POA: Diagnosis not present

## 2022-12-04 LAB — GLUCOSE, CAPILLARY
Glucose-Capillary: 218 mg/dL — ABNORMAL HIGH (ref 70–99)
Glucose-Capillary: 156 mg/dL — ABNORMAL HIGH (ref 70–99)

## 2022-12-04 NOTE — Progress Notes (Signed)
[place], [place]an ([id number]) [id number] Page 1 of 2 Visit Report for 12/01/2022 Arrival Information Details Patient Name: Date of Service: [name], [name] [name] 12/01/2022 12:00 PM Medical Record Number: [id number] Patient Account Number: [id number] Date of Birth/Sex: Treating RN: 04-Sep-1951 (72 y.o. [name] Primary Care Datrell Dunton: [name] Other Clinician: Valeria Batman Referring Sterling Mondo: Treating Rivers Hamrick/Extender: [name] Weeks in Treatment: 7 Visit Information History Since Last Visit All ordered tests and consults were completed: Yes Patient Arrived: [name] Added or deleted any medications: No Arrival Time: 11:10 Any new allergies or adverse reactions: No Accompanied By: None Had a fall or experienced change in No Transfer Assistance: None activities of daily living that may affect Patient Identification Verified: Yes risk of falls: Secondary Verification Process Completed: Yes Signs or symptoms of abuse/neglect since last visito No Patient Requires Transmission-Based Precautions: No Hospitalized since last visit: No Patient Has Alerts: No Implantable device outside of the clinic excluding No cellular tissue based products placed in the center since last visit: Pain Present Now: No Electronic Signature(s) Signed: 12/01/2022 12:43:56 PM By: Valeria Batman EMT Entered By: Valeria Batman on 12/01/2022 12:43:55 -------------------------------------------------------------------------------- Encounter Discharge Information Details Patient Name: Date of Service: [facility] [name], [name] 12/01/2022 12:00 PM Medical Record Number: [id number] Patient Account Number: [id number] Date of Birth/Sex: Treating RN: 08/31/1951 (72 y.o. [name] Primary Care Aulton Routt: [name] Other Clinician: Valeria Batman Referring [employer]: Treating Tywone Bembenek/Extender: [name] Weeks in Treatment: 7 Encounter Discharge  Information Items Discharge Condition: Stable Ambulatory Status: Cane Discharge Destination: Home Transportation: Private Auto Accompanied By: None Schedule Follow-up Appointment: Yes Clinical Summary of Care: Electronic Signature(s) Signed: 12/01/2022 1:30:41 PM By: Valeria Batman EMT Entered By: Valeria Batman on 12/01/2022 13:30:41 -------------------------------------------------------------------------------- Vitals Details Patient Name: Date of Service: [facility] [name], [name] 12/01/2022 12:00 PM Medical Record Number: [id number] Patient Account Number: [id number] Date of Birth/Sex: Treating RN: 1951-07-18 (72 y.o. [name] Primary Care Avanish Cerullo: [name] Other Clinician: Valeria Batman Referring Edder Bellanca: Treating Jahzeel Poythress/Extender: [name] Weeks in Treatment: 7 Vital Signs Time Taken: 11:14 Capillary Blood Glucose (mg/dl): 185 [place], Marquon ([id number]) [id number] Page 2 of 2 Height (in): 72 Reference Range: 80 - 120 mg / dl Weight (lbs): 185 Body Mass Index (BMI): 25.1 Electronic Signature(s) Signed: 12/01/2022 12:44:27 PM By: Valeria Batman EMT Entered By: Valeria Batman on 12/01/2022 12:44:27

## 2022-12-04 NOTE — Progress Notes (Signed)
[place], Padraic ([id number]) [id number] Page 1 of 2 Visit Report for 12/01/2022 HBO Details Patient Name: Date of Service: [name], [name] 12/01/2022 12:00 PM Medical Record Number: [id number] Patient Account Number: [id number] Date of Birth/Sex: Treating RN: 30-Mar-1951 (72 y.o. 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 HBO Treatment Details Treatment Number: 22 Patient Type: Outpatient Chamber Type: Monoplace Chamber Serial #: [id number] Treatment Protocol: 2.0 ATA with 90 minutes oxygen, and no air breaks Treatment Details Compression Rate Down: 2.0 psi / minute De-Compression Rate Up: Air breaks and breathing Decompress Decompress Compress Tx Pressure Begins Reached periods Begins Ends (leave unused spaces blank) Chamber Pressure (ATA 1 2 ------2 1 ) Clock Time (24 hr) 11:29 11:41 - - - - - - 13:11 13:18 Treatment Length: 109 (minutes) Treatment Segments: 4 Vital Signs Capillary Blood Glucose Reference Range: 80 - 120 mg / dl HBO Diabetic Blood Glucose Intervention Range: <131 mg/dl or >249 mg/dl Time Vitals Blood Respiratory Capillary Blood Glucose Pulse Action Type: Pulse: Temperature: Taken: Pressure: Rate: Glucose (mg/dl): Meter #: Oximetry (%) Taken: Pre 11:14 185 Pre 11:24 158/65 100 18 98.1 Post 13:24 141/63 93 18 98.4 226 Treatment Response Treatment Toleration: Well Treatment Completion Status: Treatment Completed without Adverse Event Physician HBO Attestation: I certify that I supervised this HBO treatment in accordance with Medicare guidelines. A trained  emergency response team is readily available per Yes hospital policies and procedures. Continue HBOT as ordered. Yes Electronic Signature(s) Signed: 12/01/2022 3:44:58 PM By: Fredirick Maudlin MD FACS Previous Signature: 12/01/2022 1:29:06 PM Version By: Valeria Batman EMT Previous Signature: 12/01/2022 12:49:36 PM Version By: Valeria Batman EMT Previous Signature: 12/01/2022 12:48:00 PM Version By: Valeria Batman EMT Entered By: Fredirick Maudlin on 12/01/2022 15:44:58 -------------------------------------------------------------------------------- HBO Safety Checklist Details Patient Name: Date of Service: [facility] [name], [name] 12/01/2022 12:00 PM Medical Record Number: [id number] Patient Account Number: [id number] Date of Birth/Sex: Treating RN: June 13, 1951 (72 y.o. [name] [name], [name] ([id number]) [id number] Page 2 of 2 Primary Care Verginia Toohey: [name] Other Clinician: Valeria Batman Referring Jenness Stemler: Treating Ethyn Schetter/Extender: [name] Weeks in Treatment: 7 HBO Safety Checklist Items Safety Checklist Consent Form Signed Patient voided / foley secured and emptied When did you last eato 1030 Last dose of injectable or oral agent last night Ostomy pouch emptied and vented if applicable NA All implantable devices assessed, documented and approved Libre2 Dialysis shunt Intravenous access site secured and place NA Valuables secured Linens and cotton and cotton/polyester blend (less than 51% polyester) Personal oil-based products / skin lotions / body lotions removed Wigs or hairpieces removed Smoking or tobacco materials removed NA Books / newspapers / magazines / loose paper removed Cologne, aftershave, perfume and deodorant removed Jewelry removed (may wrap wedding band) Make-up removed NA Hair care products removed Battery operated devices (external) removed Heating patches and chemical warmers removed Titanium eyewear  removed NA Nail polish cured greater than 10 hours NA Casting material cured greater than 10 hours NA Hearing aids removed NA Loose dentures or partials removed removed by patient Prosthetics have been removed NA Patient demonstrates correct use of air break device (if applicable) Patient concerns have been addressed Patient grounding bracelet on and cord attached to chamber Specifics for  Inpatients (complete in addition to above) Medication sheet sent with patient NA Intravenous medications needed or due during therapy sent with patient NA Drainage tubes (e.g. nasogastric tube or chest tube secured and vented) NA Endotracheal or Tracheotomy tube secured NA Cuff deflated of air and inflated with saline NA Airway suctioned NA Notes The safety checklist was done before the treatment was started. Electronic Signature(s) Signed: 12/01/2022 12:46:24 PM By: Valeria Batman EMT Entered By: Valeria Batman on 12/01/2022 12:46:24

## 2022-12-04 NOTE — Progress Notes (Signed)
[place], Nathaniel ([id number]) [id number] Page 1 of 2 Visit Report for 12/01/2022 Problem List Details Patient Name: Date of Service: [name], [name] 12/01/2022 12:00 PM Medical Record Number: [id number] Patient Account Number: [id number] Date of Birth/Sex: Treating RN: May 30, 1951 (72 y.o. 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 E11.621 Type 2 diabetes mellitus with foot ulcer 10/12/2022 No Yes N18.5 Chronic kidney disease, stage 5 10/12/2022 No Yes M86.9 Osteomyelitis, unspecified 10/12/2022 No Yes E11.42 Type 2 diabetes mellitus with diabetic polyneuropathy 10/12/2022 No Yes I10 Essential (primary) hypertension 10/12/2022 No Yes Inactive Problems Resolved Problems Electronic Signature(s) Signed: 12/01/2022 1:30:03 PM By: Valeria Batman EMT Signed: 12/01/2022 2:08:45 PM By: Fredirick Maudlin MD FACS Entered By: Valeria Batman on 12/01/2022 13:30:03 -------------------------------------------------------------------------------- SuperBill Details Patient Name: Date of Service: [name], [name] 12/01/2022 Medical Record Number: [id number] Patient Account Number: [id number] Date of Birth/Sex: Treating RN: 09-24-1951 (72 y.o. [name] Primary Care Provider: [name] Other Clinician: Valeria Batman Referring Provider: Treating Provider/Extender: [name] Weeks in Treatment: 7 Diagnosis Coding ICD-10 Codes Code Description [phone] 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.621 Type 2 diabetes mellitus with foot ulcer [name], [name] ([id number])  [phone].pdf Page 2 of 2 N18.5 Chronic kidney disease, stage 5 M86.9 Osteomyelitis, unspecified E11.42 Type 2 diabetes mellitus with diabetic polyneuropathy I10 Essential (primary) hypertension Facility Procedures : CPT4 Code Description: [id number] G0277-(Facility Use Only) HBOT full body chamber, 77mn , ICD-10 Diagnosis Description M86.9 Osteomyelitis, unspecified L97.514 Non-pressure chronic ulcer of other part of right foo E11.42 Type 2 diabetes mellitus with  diabetic polyneuropathy E11.621 Type 2 diabetes mellitus with foot ulcer Modifier: t with necrosis Quantity: 4 of bone Physician Procedures : CPT4 Code Description Modifier [id number] PHYS HYPERBARIC OXYGEN THERAPY ICD-10 Diagnosis Description M86.9 Osteomyelitis, unspecified 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

## 2022-12-05 ENCOUNTER — Ambulatory Visit (HOSPITAL_BASED_OUTPATIENT_CLINIC_OR_DEPARTMENT_OTHER): Payer: No Typology Code available for payment source | Admitting: General Surgery

## 2022-12-05 ENCOUNTER — Encounter (HOSPITAL_BASED_OUTPATIENT_CLINIC_OR_DEPARTMENT_OTHER): Payer: No Typology Code available for payment source | Admitting: General Surgery

## 2022-12-05 NOTE — Progress Notes (Signed)
[place], Seith ([id number]) [id number] Page 1 of 2 Visit Report for 12/04/2022 HBO Details Patient Name: Date of Service: [facility] [name], [name] 12/04/2022 12:00 PM Medical Record Number: [id number] Patient Account Number: [id number] Date of Birth/Sex: Treating RN: Nov 10, 1950 (72 y.o. 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 HBO Treatment Details Treatment Number: 23 Patient Type: Outpatient Chamber Type: Monoplace Chamber Serial #: [id number] Treatment Protocol: 2.0 ATA with 90 minutes oxygen, and no air breaks Treatment Details Compression Rate Down: 2.0 psi / minute De-Compression Rate Up: 2.0 psi / minute Air breaks and breathing Decompress Decompress Compress Tx Pressure Begins Reached periods Begins Ends (leave unused spaces blank) Chamber Pressure (ATA 1 2 ------2 1 ) Clock Time (24 hr) 12:08 12:16 - - - - - - 13:46 13:54 Treatment Length: 106 (minutes) Treatment Segments: 4 Vital Signs Capillary Blood Glucose Reference Range: 80 - 120 mg / dl HBO Diabetic Blood Glucose Intervention Range: <131 mg/dl or >249 mg/dl Time Vitals Blood Respiratory Capillary Blood Glucose Pulse Action Type: Pulse: Temperature: Taken: Pressure: Rate: Glucose (mg/dl): Meter #: Oximetry (%) Taken: Pre 11:40 140/75 98 16 98.4 218 Post 13:59 145/65 89 16 98.5 156 Treatment Response Treatment Toleration: Well Treatment Completion Status: Treatment Completed without Adverse Event Physician HBO Attestation: I certify that I supervised this HBO treatment in accordance with Medicare guidelines. A trained  emergency response team is readily available per Yes hospital policies and procedures. Continue HBOT as ordered. Yes Electronic Signature(s) Signed: 12/04/2022 5:09:19 PM By: Fredirick Maudlin MD FACS Previous Signature: 12/04/2022 2:42:15 PM Version By: Valeria Batman EMT Entered By: Fredirick Maudlin on 12/04/2022 17:09:19 -------------------------------------------------------------------------------- HBO Safety Checklist Details Patient Name: Date of Service: [facility] [name], [name] 12/04/2022 12:00 PM Medical Record Number: [id number] Patient Account Number: [id number] Date of Birth/Sex: Treating RN: 1950-12-29 (72 y.o. [name] Primary Care Mariah Gerstenberger: [name] Other Clinician: Donavan Burnet Referring Libbie Bartley: Treating Syd Newsome/Extender: [name] [place], [name] ([id number]) [id number] Page 2 of 2 Weeks in Treatment: 7 HBO Safety Checklist Items Safety Checklist Consent Form Signed Patient voided / foley secured and emptied When did you last eato 1000 Last dose of injectable or oral agent Last PM Ostomy pouch emptied and vented if applicable NA All implantable devices assessed, documented and approved NA Intravenous access site secured and place NA Valuables secured Linens and cotton and cotton/polyester blend (less than 51% polyester) Personal oil-based products / skin lotions / body lotions removed Wigs or hairpieces removed NA Smoking or tobacco materials removed NA Books / newspapers / magazines / loose paper removed Cologne, aftershave, perfume and deodorant removed Jewelry removed (may wrap wedding band) Make-up removed Hair care products removed Battery operated devices (external) removed Heating patches and chemical warmers removed NA Titanium eyewear removed NA Nail polish cured greater than 10 hours NA Casting material cured greater than 10 hours NA Hearing aids removed NA Loose dentures or partials removed  dentures removed Prosthetics have been removed NA Patient demonstrates correct use of air break device (if applicable) Patient concerns have been addressed Patient grounding bracelet on and cord attached to chamber Specifics for Inpatients (complete in addition to above) Medication sheet sent with patient NA Intravenous medications needed or due during therapy sent  with patient NA Drainage tubes (e.g. nasogastric tube or chest tube secured and vented) NA Endotracheal or Tracheotomy tube secured NA Cuff deflated of air and inflated with saline NA Airway suctioned NA Notes Paper version used prior to treatment. Electronic Signature(s) Signed: 12/04/2022 12:48:34 PM By: Donavan Burnet CHT EMT BS , , Entered By: Donavan Burnet on 12/04/2022 12:48:34

## 2022-12-05 NOTE — Progress Notes (Signed)
[name], [name] ([id number]) [id number] Page 1 of 2 Visit Report for 12/04/2022 Arrival Information Details Patient Name: Date of Service: [place], [name] 12/04/2022 12:00 PM Medical Record Number: [id number] Patient Account Number: [id number] Date of Birth/Sex: Treating RN: 04-Aug-1951 (72 y.o. [name], [name] Primary Care [name]: [name] Other Clinician: Donavan Burnet Referring [name]: Treating [name]/Extender: [name] Weeks in Treatment: 7 Visit Information History Since Last Visit All ordered tests and consults were completed: Yes Patient Arrived: Cane Added or deleted any medications: No Arrival Time: 11:23 Any new allergies or adverse reactions: No Accompanied By: self Had a fall or experienced change in No Transfer Assistance: None activities of daily living that may affect Patient Identification Verified: Yes risk of falls: Secondary Verification Process Completed: Yes Signs or symptoms of abuse/neglect since last visito No Patient Requires Transmission-Based Precautions: No Hospitalized since last visit: No Patient Has Alerts: No Implantable device outside of the clinic excluding No cellular tissue based products placed in the center since last visit: Pain Present Now: No Electronic Signature(s) Signed: 12/04/2022 12:36:11 PM By: Donavan Burnet CHT EMT BS , , Entered By: Donavan Burnet on 12/04/2022 12:36:11 -------------------------------------------------------------------------------- Encounter Discharge Information Details Patient Name: Date of Service: [facility] [name], [name] 12/04/2022 12:00 PM Medical Record Number: [id number] Patient Account Number: [id number] Date of Birth/Sex: Treating RN: Feb 26, 1951 (72 y.o. [name] Primary Care [name]: [name] Other Clinician: Valeria Batman Referring [name]: Treating [name]/Extender: [name] Weeks in Treatment: 7 Encounter  Discharge Information Items Discharge Condition: Stable Ambulatory Status: Cane Discharge Destination: Home Transportation: Private Auto Accompanied By: None Schedule Follow-up Appointment: Yes Clinical Summary of Care: Electronic Signature(s) Signed: 12/04/2022 2:44:23 PM By: Valeria Batman EMT Entered By: Valeria Batman on 12/04/2022 14:44:23 -------------------------------------------------------------------------------- Vitals Details Patient Name: Date of Service: [facility] [name], [name] 12/04/2022 12:00 PM Medical Record Number: [id number] Patient Account Number: [id number] Date of Birth/Sex: Treating RN: 1951-02-02 (72 y.o. [name] Primary Care [name]: [name] Other Clinician: Donavan Burnet Referring [name]: Treating [name]/Extender: [name] Weeks in Treatment: 7 Vital Signs Time Taken: 11:40 Temperature (F): 98.4 [name], [name] ([id number]) [phone].pdf Page 2 of 2 Height (in): 72 Pulse (bpm): 98 Weight (lbs): 185 Respiratory Rate (breaths/min): 16 Body Mass Index (BMI): 25.1 Blood Pressure (mmHg): 140/75 Capillary Blood Glucose (mg/dl): 218 Reference Range: 80 - 120 mg / dl Electronic Signature(s) Signed: 12/04/2022 12:36:37 PM By: Donavan Burnet CHT EMT BS , , Entered By: Donavan Burnet on 12/04/2022 12:36:37

## 2022-12-05 NOTE — Progress Notes (Signed)
[place], [place] ([id number]) [id number] Page 1 of 2 Visit Report for 12/04/2022 Problem List Details Patient Name: Date of Service: [name], [name] 12/04/2022 12:00 PM Medical Record Number: [id number] Patient Account Number: [id number] Date of Birth/Sex: Treating RN: Dec 16, 1950 (72 y.o. 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 E11.621 Type 2 diabetes mellitus with foot ulcer 10/12/2022 No Yes N18.5 Chronic kidney disease, stage 5 10/12/2022 No Yes M86.9 Osteomyelitis, unspecified 10/12/2022 No Yes E11.42 Type 2 diabetes mellitus with diabetic polyneuropathy 10/12/2022 No Yes I10 Essential (primary) hypertension 10/12/2022 No Yes Inactive Problems Resolved Problems Electronic Signature(s) Signed: 12/04/2022 2:42:56 PM By: Valeria Batman EMT Signed: 12/04/2022 5:08:25 PM By: Fredirick Maudlin MD FACS Entered By: Valeria Batman on 12/04/2022 14:42:55 -------------------------------------------------------------------------------- SuperBill Details Patient Name: Date of Service: [name], [name] 12/04/2022 Medical Record Number: [id number] Patient Account Number: [id number] Date of Birth/Sex: Treating RN: 27-Aug-1951 (72 y.o. [name] Primary Care Provider: [name] Other Clinician: Valeria Batman Referring Provider: Treating Provider/Extender: [name] Weeks in Treatment: 7 Diagnosis Coding ICD-10 Codes Code Description [phone] 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.621 Type 2 diabetes mellitus with foot ulcer [place], [place] ([id number]) [id number].pdf  Page 2 of 2 N18.5 Chronic kidney disease, stage 5 M86.9 Osteomyelitis, unspecified E11.42 Type 2 diabetes mellitus with diabetic polyneuropathy I10 Essential (primary) hypertension Facility Procedures : CPT4 Code Description: [id number] G0277-(Facility Use Only) HBOT full body chamber, 56mn , ICD-10 Diagnosis Description M86.9 Osteomyelitis, unspecified L97.514 Non-pressure chronic ulcer of other part of right foo E11.42 Type 2 diabetes mellitus with  diabetic polyneuropathy E11.621 Type 2 diabetes mellitus with foot ulcer Modifier: t with necrosis Quantity: 4 of bone Physician Procedures : CPT4 Code Description Modifier [id number] PHYS HYPERBARIC OXYGEN THERAPY ICD-10 Diagnosis Description M86.9 Osteomyelitis, unspecified 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

## 2022-12-06 ENCOUNTER — Encounter (HOSPITAL_BASED_OUTPATIENT_CLINIC_OR_DEPARTMENT_OTHER): Payer: No Typology Code available for payment source | Admitting: General Surgery

## 2022-12-07 ENCOUNTER — Encounter (HOSPITAL_BASED_OUTPATIENT_CLINIC_OR_DEPARTMENT_OTHER): Payer: No Typology Code available for payment source | Admitting: General Surgery

## 2022-12-07 DIAGNOSIS — E11621 Type 2 diabetes mellitus with foot ulcer: Secondary | ICD-10-CM | POA: Diagnosis not present

## 2022-12-07 DIAGNOSIS — M86671 Other chronic osteomyelitis, right ankle and foot: Secondary | ICD-10-CM | POA: Diagnosis not present

## 2022-12-07 LAB — GLUCOSE, CAPILLARY
Glucose-Capillary: 195 mg/dL — ABNORMAL HIGH (ref 70–99)
Glucose-Capillary: 211 mg/dL — ABNORMAL HIGH (ref 70–99)

## 2022-12-08 ENCOUNTER — Encounter (HOSPITAL_BASED_OUTPATIENT_CLINIC_OR_DEPARTMENT_OTHER): Payer: No Typology Code available for payment source | Admitting: General Surgery

## 2022-12-08 DIAGNOSIS — E11621 Type 2 diabetes mellitus with foot ulcer: Secondary | ICD-10-CM | POA: Diagnosis not present

## 2022-12-08 DIAGNOSIS — M86671 Other chronic osteomyelitis, right ankle and foot: Secondary | ICD-10-CM | POA: Diagnosis not present

## 2022-12-08 LAB — GLUCOSE, CAPILLARY
Glucose-Capillary: 185 mg/dL — ABNORMAL HIGH (ref 70–99)
Glucose-Capillary: 268 mg/dL — ABNORMAL HIGH (ref 70–99)

## 2022-12-08 NOTE — Progress Notes (Signed)
[place], [place] ([id number]) [id number] Page 1 of 1 Visit Report for 12/07/2022 SuperBill Details Patient Name: Date of Service: [name], [name] 12/07/2022 Medical Record Number: [id number] Patient Account Number: [id number] Date of Birth/Sex: Treating RN: August 19, 1951 (71 y.o. [name] [place] Primary Care Provider: [name] Other Clinician: Donavan Burnet Referring Provider: Treating Provider/Extender: [name] Weeks in Treatment: 8 Diagnosis Coding ICD-10 Codes Code Description [phone] 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.621 Type 2 diabetes mellitus with foot ulcer N18.5 Chronic kidney disease, stage 5 M86.9 Osteomyelitis, unspecified E11.42 Type 2 diabetes mellitus with diabetic polyneuropathy I10 Essential (primary) hypertension Facility Procedures CPT4 Code Description Modifier Quantity [id number] G0277-(Facility Use Only) HBOT full body chamber, 41mn , 4 ICD-10 Diagnosis Description M86.9 Osteomyelitis, unspecified 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.621 Type 2 diabetes mellitus with foot ulcer Physician Procedures Quantity CPT4 Code Description Modifier [id number] PHYS HYPERBARIC OXYGEN THERAPY 1 ICD-10 Diagnosis Description M86.9 Osteomyelitis, unspecified 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

## 2022-12-08 NOTE — Progress Notes (Signed)
[place], Kdyn ([id number]) [id number] Page 1 of 2 Visit Report for 12/07/2022 Arrival Information Details Patient Name: Date of Service: [place], [name] 12/07/2022 12:00 PM Medical Record Number: [id number] Patient Account Number: [id number] Date of Birth/Sex: Treating RN: 03-12-1951 (72 y.o. [name] Primary Care Caden Fukushima: [name] Other Clinician: Donavan Burnet Referring Montarius Kitagawa: Treating Tierany Appleby/Extender: [name] Weeks in Treatment: 8 Visit Information History Since Last Visit All ordered tests and consults were completed: Yes Patient Arrived: [name] Added or deleted any medications: No Arrival Time: 10:37 Any new allergies or adverse reactions: No Accompanied By: self Had a fall or experienced change in No Transfer Assistance: None activities of daily living that may affect Patient Identification Verified: Yes risk of falls: Secondary Verification Process Completed: Yes Signs or symptoms of abuse/neglect since last visito No Patient Requires Transmission-Based Precautions: No Hospitalized since last visit: No Patient Has Alerts: No Implantable device outside of the clinic excluding No cellular tissue based products placed in the center since last visit: Pain Present Now: No Electronic Signature(s) Signed: 12/07/2022 11:48:26 AM By: Donavan Burnet CHT EMT BS , , Entered By: Donavan Burnet on 12/07/2022 11:48:25 -------------------------------------------------------------------------------- Encounter Discharge Information Details Patient Name: Date of Service: [facility] [name], [name] 12/07/2022 12:00 PM Medical Record Number: [id number] Patient Account Number: [id number] Date of Birth/Sex: Treating RN: 11-04-1950 (72 y.o. [name] Primary Care Fredderick Swanger: [name] Other Clinician: Donavan Burnet Referring Aliese Brannum: Treating Tasheka Houseman/Extender: [name] Weeks in Treatment:  8 Encounter Discharge Information Items Discharge Condition: Stable Ambulatory Status: Cane Discharge Destination: Home Transportation: Private Auto Accompanied By: self Schedule Follow-up Appointment: No Clinical Summary of Care: Electronic Signature(s) Signed: 12/07/2022 1:00:44 PM By: Donavan Burnet CHT EMT BS , , Entered By: Donavan Burnet on 12/07/2022 13:00:44 -------------------------------------------------------------------------------- Vitals Details Patient Name: Date of Service: [facility] [name], [name] 12/07/2022 12:00 PM Medical Record Number: [id number] Patient Account Number: [id number] Date of Birth/Sex: Treating RN: 12-07-1950 (72 y.o. [name] Primary Care Lulu Hirschmann: [name] Other Clinician: Donavan Burnet Referring Antione Obar: Treating Telesforo Brosnahan/Extender: [name] Weeks in Treatment: 8 Vital Signs Time Taken: 10:42 Temperature (F): 98.6 [name], [name] ([id number]) [phone].pdf Page 2 of 2 Height (in): 72 Pulse (bpm): 107 Weight (lbs): 185 Respiratory Rate (breaths/min): 18 Body Mass Index (BMI): 25.1 Blood Pressure (mmHg): 167/60 Capillary Blood Glucose (mg/dl): 195 Reference Range: 80 - 120 mg / dl Electronic Signature(s) Signed: 12/07/2022 11:48:59 AM By: Donavan Burnet CHT EMT BS , , Entered By: Donavan Burnet on 12/07/2022 11:48:59

## 2022-12-08 NOTE — Progress Notes (Signed)
[name] ([id number]) [id number] Page 1 of 2 Visit Report for 12/07/2022 HBO Details Patient Name: Date of Service: [facility] [name], [name] 12/07/2022 12:00 PM Medical Record Number: [id number] Patient Account Number: [id number] Date of Birth/Sex: Treating RN: 1951/05/14 (72 y.o. 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 HBO Treatment Details Treatment Number: 24 Patient Type: Outpatient Chamber Type: Monoplace Chamber Serial #: [id number] Treatment Protocol: 2.0 ATA with 90 minutes oxygen, and no air breaks Treatment Details Compression Rate Down: 1.5 psi / minute De-Compression Rate Up: 2.0 psi / minute Air breaks and breathing Decompress Decompress Compress Tx Pressure Begins Reached periods Begins Ends (leave unused spaces blank) Chamber Pressure (ATA 1 2 ------2 1 ) Clock Time (24 hr) 10:58 11:07 - - - - - - 12:37 12:44 Treatment Length: 106 (minutes) Treatment Segments: 4 Vital Signs Capillary Blood Glucose Reference Range: 80 - 120 mg / dl HBO Diabetic Blood Glucose Intervention Range: <131 mg/dl or >249 mg/dl Type: Time Vitals Blood Respiratory Capillary Blood Glucose Pulse Action Pulse: Temperature: Taken: Pressure: Rate: Glucose (mg/dl): Meter #: Oximetry (%) Taken: Pre 10:42 167/60 107 18 98.6 195 1 none per protocol Post 12:49 155/63 87 18 98.2 211 1 none per protocol Treatment Response Treatment Toleration: Well Treatment Completion Status: Treatment Completed without Adverse Event Treatment Notes Patient arrived, blood glucose measured at 195 mg/dL. Mr. Vowles  prepared for treatment. Other vital signs were normal with the exception of heart rate at 107 bpm. Patient was asymptomatic for tachycardia. After performing safety check, patient was placed in the chamber being compressed with 100% oxygen at a rate of 2 psi/min after confirming normal ear equalization. Patient tolerated treatment and the following decompression of the chamber at the rate of 2 psi/min. Patient was stable upon discharge. Physician HBO Attestation: I certify that I supervised this HBO treatment in accordance with Medicare guidelines. A trained emergency response team is readily available per Yes hospital policies and procedures. Continue HBOT as ordered. Yes Electronic Signature(s) Signed: 12/07/2022 1:03:46 PM By: [name] Maudlin MD FACS Previous Signature: 12/07/2022 12:58:20 PM Version By: [name] CHT EMT BS , , Previous Signature: 12/07/2022 12:57:58 PM Version By: [name] CHT EMT BS , , Previous Signature: 12/07/2022 11:54:16 AM Version By: [name] CHT EMT BS , , Entered By: [name] on 12/07/2022 13:03:45 [name], [name] ([id number].pdf Page 2 of 2 -------------------------------------------------------------------------------- HBO Safety Checklist Details Patient Name: Date of Service: [name], [name] 12/07/2022 12:00 PM Medical Record Number: [id number] Patient Account Number: [id number] Date of Birth/Sex: Treating RN: 1950-10-25 (72 y.o. [name] Primary Care [name]: [name] Other Clinician: Donavan [name] Referring Beckett Maden: Treating [name]/Extender: [name] in Treatment: 8 HBO Safety Checklist Items Safety Checklist Consent Form Signed Patient voided / foley secured and emptied When did you last eato 1030 Last dose of injectable or oral agent Last Evening Ostomy pouch emptied and vented if applicable NA All implantable devices assessed, documented  and approved NA Intravenous access site secured and place NA Valuables secured Linens and cotton and cotton/polyester blend (less than 51% polyester) Personal oil-based products / skin lotions / body lotions removed Wigs or hairpieces removed NA Smoking or tobacco materials removed NA  Books / newspapers / magazines / loose paper removed Cologne, aftershave, perfume and deodorant removed Jewelry removed (may wrap wedding band) Make-up removed Hair care products removed Battery operated devices (external) removed Heating patches and chemical warmers removed Titanium eyewear removed Nail polish cured greater than 10 hours NA Casting material cured greater than 10 hours NA Hearing aids removed NA Loose dentures or partials removed dentures removed Prosthetics have been removed NA Patient demonstrates correct use of air break device (if applicable) Patient concerns have been addressed Patient grounding bracelet on and cord attached to chamber Specifics for Inpatients (complete in addition to above) Medication sheet sent with patient NA Intravenous medications needed or due during therapy sent with patient NA Drainage tubes (e.g. nasogastric tube or chest tube secured and vented) NA Endotracheal or Tracheotomy tube secured NA Cuff deflated of air and inflated with saline NA Airway suctioned NA Notes Paper version used prior to treatment. Electronic Signature(s) Signed: 12/07/2022 11:50:19 AM By: [name] CHT EMT BS , , Entered By: [name] on 12/07/2022 11:50:19

## 2022-12-10 ENCOUNTER — Encounter (INDEPENDENT_AMBULATORY_CARE_PROVIDER_SITE_OTHER): Payer: Self-pay | Admitting: Nurse Practitioner

## 2022-12-10 NOTE — Progress Notes (Signed)
[place], Kyndal ([id number]) [id number] Page 1 of 1 Visit Report for 12/08/2022 SuperBill Details Patient Name: Date of Service: [name], [name] 12/08/2022 Medical Record Number: [id number] Patient Account Number: [id number] Date of Birth/Sex: Treating RN: Nov 23, 1950 (72 y.o. [name] Primary Care Provider: [name] Other Clinician: Donavan Burnet Referring Provider: Treating Provider/Extender: [name] Weeks in Treatment: 8 Diagnosis Coding ICD-10 Codes Code Description [phone] 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.621 Type 2 diabetes mellitus with foot ulcer N18.5 Chronic kidney disease, stage 5 M86.9 Osteomyelitis, unspecified E11.42 Type 2 diabetes mellitus with diabetic polyneuropathy I10 Essential (primary) hypertension Facility Procedures CPT4 Code Description Modifier Quantity [id number]-(Facility Use Only) HBOT full body chamber, 56mn , 4 ICD-10 Diagnosis Description M86.9 Osteomyelitis, unspecified 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.621 Type 2 diabetes mellitus with foot ulcer Physician Procedures Quantity CPT4 Code Description Modifier [id number] PHYS HYPERBARIC OXYGEN THERAPY 1 ICD-10 Diagnosis Description M86.9 Osteomyelitis, unspecified 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

## 2022-12-10 NOTE — Progress Notes (Signed)
Subjective:    Patient ID: [name], male    DOB: September 09, 1951, 72 y.o.   MRN: [id number] Chief Complaint  Patient presents with   Follow-up    HDA    The patient returns to the office for followup of their dialysis access.   The patient reports the function of the access has been stable. Patient denies difficulty with cannulation. The patient denies increased bleeding time after removing the needles. The patient denies hand pain or other symptoms consistent with steal phenomena.  No significant arm swelling.  The patient had the access created On 10/20/2022  The patient denies redness or swelling at the access site. The patient denies fever or chills at home or while on dialysis.  No recent shortening of the patient's walking distance or new symptoms consistent with claudication.  No history of rest pain symptoms. No new ulcers or wounds of the lower extremities have occurred.  The patient denies amaurosis fugax or recent TIA symptoms. There are no recent neurological changes noted. There is no history of DVT, PE or superficial thrombophlebitis. No recent episodes of angina or shortness of breath documented.   Duplex ultrasound of the AV access shows a patent access.  This is a first look at the patient's access following creation on 10/20/2022.  Flow volume today is 1860 cc/min    Review of Systems  All other systems reviewed and are negative.      Objective:   Physical Exam Vitals reviewed.  HENT:     Head: Normocephalic.  Cardiovascular:     Rate and Rhythm: Normal rate.     Pulses:          Radial pulses are 2+ on the right side and 2+ on the left side.     Arteriovenous access: Left arteriovenous access is present.    Comments: Good thrill and bruit Pulmonary:     Effort: Pulmonary effort is normal.  Skin:    General: Skin is warm and dry.  Neurological:     Mental Status: He is alert and oriented to person, place, and time.  Psychiatric:        Mood and  Affect: Mood normal.        Behavior: Behavior normal.        Thought Content: Thought content normal.        Judgment: Judgment normal.     BP (!) 175/65 (BP Location: Right Arm)   Pulse 99   Ht 6' (1.829 m)   Wt 193 lb (87.5 kg)   BMI 26.18 kg/m   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)    Diabetic neuropathy (HCC)    Diabetic ulcer of foot associated with diabetes mellitus due to underlying condition, limited to breakdown of skin ([place])    left   GERD (gastroesophageal reflux disease)    Hyperlipidemia    Hypertension    Non compliance w medication regimen    Osteomyelitis ([place]) 08/2022   great toe of right foot   Osteomyelitis of left foot ([place])    Secondary hyperparathyroidism of renal origin [facility])    per nephrology lov dr Candiss Norse 05-22-2022   Stroke [facility]) 2016   no deficits    Social History   Socioeconomic History   Marital status: Married    Spouse name: Geni Bers   Number of children: Not on  file   Years of education: Not on file   Highest education level: Not on file  Occupational History   Not on file  Tobacco Use   Smoking status: Former   Smokeless tobacco: Never  Vaping Use   Vaping Use: Never used  Substance and Sexual Activity   Alcohol use: Not Currently   Drug use: Never   Sexual activity: Not on file  Other Topics Concern   Not on file  Social History Narrative   Not on file   Social Determinants of Health   Financial Resource Strain: Not on file  Food Insecurity: Not on file  Transportation Needs: No Transportation Needs (08/17/2022)   PRAPARE - Hydrologist (Medical): No    Lack of Transportation (Non-Medical): No  Physical Activity: Not on file  Stress: Not on file  Social Connections: Not on file  Intimate Partner Violence: Not on file    Past Surgical History:  Procedure  Laterality Date   AMPUTATION OF REPLICATED TOES Left [id number]   left big toe, second toe   APPENDECTOMY     AV FISTULA PLACEMENT Left 10/20/2022   Procedure: INSERTION OF ARTERIOVENOUS (AV) GORE-TEX GRAFT ARM ( BRACHIAL AXILLARY);  Surgeon: Katha Cabal, MD;  Location: [facility];  Service: Vascular;  Laterality: Left;   BIOPSY  08/10/2020   Procedure: BIOPSY;  Surgeon: Harvel Quale, MD;  [facility];  Service: Gastroenterology;;   CHOLECYSTECTOMY     COLON SURGERY     blockage   COLONOSCOPY WITH PROPOFOL N/A 08/10/2020   Procedure: COLONOSCOPY WITH PROPOFOL;  Surgeon: Harvel Quale, MD;  [facility];  Service: Gastroenterology;  Laterality: N/A;  [id number]   GRAFT APPLICATION Left [id number]   Procedure: GRAFT APPLICATION;  Surgeon: [name], DPM;  Location: [place];  Service: Podiatry;  Laterality: Left;   HERNIA REPAIR     I & D EXTREMITY Left 10/10/2021   Procedure: IRRIGATION AND DEBRIDEMENT left foot;  Surgeon: Evelina Bucy, DPM;  Location: [name];  Service: Podiatry;  Laterality: Left;   I & D EXTREMITY Right 08/19/2022   Procedure: IRRIGATION AND DEBRIDEMENT RIGHT GREAT TOE GRAFT APPLICATION WITH BIOPSY;  Surgeon: Criselda Peaches, DPM;  Location: [place];  Service: Podiatry;  Laterality: Right;   IRRIGATION AND DEBRIDEMENT FOOT Left 10/16/2021   Procedure: IRRIGATION AND DEBRIDEMENT FOOT;  Surgeon: [name], DPM;  Location: [place];  Service: Podiatry;  Laterality: Left;  Abscess left heel   METATARSAL HEAD EXCISION Left 12/08/2019   Procedure: METATARSAL HEAD RECESSION THIRD LEFT;  Surgeon: [name], DPM;  Location: [place];  Service: Podiatry;  Laterality: Left;   POLYPECTOMY  08/10/2020   Procedure: POLYPECTOMY;  Surgeon: Harvel Quale, MD;  [facility];  Service: Gastroenterology;;   WOUND DEBRIDEMENT Left 12/08/2019   Procedure: DEBRIDEMENT WOUND;   Surgeon: [name], DPM;  Location: [place];  Service: Podiatry;  Laterality: Left;   WOUND DEBRIDEMENT Left 10/12/2021   Procedure: LEFT FOOT WOUND DEBRIDEMENT AND IRRIGATION, 1ST METATARSAL RESECTION;  Surgeon: Evelina Bucy, DPM;  Location: [place];  Service: Podiatry;  Laterality: Left;    History reviewed. No pertinent family history.  Allergies  Allergen Reactions   Losartan Potassium Other (See Comments)    hyperkalemia   Sildenafil Other (See Comments)    Not effective. "Did not like the way it made me feel"  Latest Ref Rng & Units 10/20/2022    6:43 AM 10/18/2022    1:54 PM 10/05/2022    9:46 AM  CBC  WBC 4.0 - 10.5 K/uL  9.4  8.6   Hemoglobin 13.0 - 17.0 g/dL 11.6  11.1  12.7   Hematocrit 39.0 - 52.0 % 34.0  35.7  38.8   Platelets 150 - 400 K/uL  210  203       CMP     Component Value Date/Time   NA 144 10/20/2022 0643   K 4.9 10/20/2022 0643   CL 114 (H) 10/20/2022 0643   CO2 23 10/18/2022 1354   GLUCOSE 160 (H) 10/20/2022 0643   BUN 35 (H) 10/20/2022 0643   CREATININE 4.80 (H) 10/20/2022 0643   CREATININE 4.39 (H) 10/05/2022 0946   CALCIUM 8.9 10/18/2022 1354   PROT 7.2 10/05/2022 0946   ALBUMIN 3.3 (L) 08/16/2022 2146   AST 32 10/05/2022 0946   ALT 90 (H) 10/05/2022 0946   ALKPHOS 68 08/16/2022 2146   BILITOT 0.3 10/05/2022 0946   GFRNONAA 16 (L) 10/18/2022 1354     No results found.     Assessment & Plan:   1. ESRD (end stage renal disease) ([place]) Recommend:  The patient is doing well and currently has adequate dialysis access.  Although there are some parameters suggesting possible future issues.  We will indicate that the patient's dialysis access is adequate for use to center.   The patient will follow-up with me in the office in 3 months.  The need for a follow up duplex ultrasound will be made at that time based on whether problems with the access are persistent.    2. Type 2 diabetes mellitus with peripheral  neuropathy ([place]) Continue hypoglycemic medications as already ordered, these medications have been reviewed and there are no changes at this time.  Hgb A1C to be monitored as already arranged by primary service  3. Hyperlipidemia, mixed Continue statin as ordered and reviewed, no changes at this time   Current Outpatient Medications on File Prior to Visit  Medication Sig Dispense Refill   amLODipine (NORVASC) 10 MG tablet Take 10 mg by mouth daily in the afternoon.     aspirin EC 81 MG tablet Take 81 mg by mouth daily. Swallow whole.     atorvastatin (LIPITOR) 80 MG tablet Take 80 mg by mouth daily in the afternoon.     calcitRIOL (ROCALTROL) 0.25 MCG capsule Take 0.25 mcg by mouth daily in the afternoon.     Continuous Blood Gluc Sensor (FREESTYLE LIBRE 2 SENSOR) MISC USE TO CHECK BLOOD GLUCOSE CONTINUOUSLY. CHANGE SENSOR EVERY 14 DAYS 6 each 3   docusate sodium (COLACE) 100 MG capsule Take 100 mg by mouth daily.     Dulaglutide 4.5 MG/0.5ML SOPN Inject 4.5 mg into the skin every Sunday.     NOVOLOG MIX 70/30 FLEXPEN (70-30) 100 UNIT/ML FlexPen Inject 20 Units into the skin 2 (two) times daily with a meal.     torsemide (DEMADEX) 20 MG tablet Take 20 mg by mouth daily in the afternoon.     No current facility-administered medications on file prior to visit.    There are no Patient Instructions on file for this visit. No follow-ups on file.   Kris Hartmann, NP

## 2022-12-10 NOTE — Progress Notes (Signed)
[employer], [place] ([id number]) [id number] Page 1 of 2 Visit Report for 12/08/2022 HBO Details Patient Name: Date of Service: [facility] [name], [name] 12/08/2022 12:00 PM Medical Record Number: [id number] Patient Account Number: [id number] Date of Birth/Sex: Treating RN: August 05, 1951 (72 y.o. 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 HBO Treatment Details Treatment Number: 25 Patient Type: Outpatient Chamber Type: Monoplace Chamber Serial #: [id number] Treatment Protocol: 2.0 ATA with 90 minutes oxygen, and no air breaks Treatment Details Compression Rate Down: 1.5 psi / minute De-Compression Rate Up: 2.0 psi / minute Air breaks and breathing Decompress Decompress Compress Tx Pressure Begins Reached periods Begins Ends (leave unused spaces blank) Chamber Pressure (ATA 1 2 ------2 1 ) Clock Time (24 hr) 11:20 11:30 - - - - - - 13:00 13:09 Treatment Length: 109 (minutes) Treatment Segments: 4 Vital Signs Capillary Blood Glucose Reference Range: 80 - 120 mg / dl HBO Diabetic Blood Glucose Intervention Range: <131 mg/dl or >249 mg/dl Type: Time Vitals Blood Pulse: Respiratory Temperature: Capillary Blood Glucose Pulse Action Taken: Pressure: Rate: Glucose (mg/dl): Meter #: Oximetry (%) Taken: Pre 11:07 183/67 108 18 97.7 185 1 asymptomatic for tachycardia Post 13:14 161/64 92 18 98.2 268 1 patient asymptomatic for hyperglycemia Treatment Response Treatment Toleration: Well Treatment Completion Status: Treatment Completed without Adverse Event Treatment Notes Patient arrived, blood glucose  measured at 185 mg/dL. Mr. Arey prepared for treatment. Other vital signs were normal with the exception of heart rate at 108 bpm. Patient was asymptomatic for tachycardia. After performing safety check, patient was placed in the chamber being compressed with 100% oxygen at a rate of 2 psi/min after confirming normal ear equalization. Patient tolerated treatment and the following decompression of the chamber at the rate of 2 psi/min. Patient was stable upon discharge. Physician HBO Attestation: I certify that I supervised this HBO treatment in accordance with Medicare guidelines. A trained emergency response team is readily available per Yes hospital policies and procedures. Continue HBOT as ordered. Yes Electronic Signature(s) Signed: 12/08/2022 4:38:40 PM By: Fredirick Maudlin MD FACS Previous Signature: 12/08/2022 3:25:55 PM Version By: Donavan Burnet CHT EMT BS , , Entered By: Fredirick Maudlin on 12/08/2022 16:38:39 HBO Safety Checklist Details -------------------------------------------------------------------------------- [name] ([id number].pdf Page 2 of 2 Patient Name: Date of ServiceJodene Nam, [name] 12/08/2022 12:00 PM Medical Record Number: [id number] Patient Account Number: [id number] Date of Birth/Sex: Treating RN: 02/04/1951 (72 y.o. [name] Primary Care Zarria Towell: [name] Other Clinician: Valeria Batman Referring Braxton Vantrease: Treating Kort Stettler/Extender: [name] Weeks in Treatment: 8 HBO Safety Checklist Items Safety Checklist Consent Form Signed Patient voided / foley secured and emptied When did you last eato 1030 Last dose of injectable or oral agent last pm Ostomy pouch emptied and vented if applicable NA All implantable devices assessed, documented and approved NA Intravenous access site secured and place NA Valuables secured Linens and cotton and cotton/polyester blend (less than 51%  polyester) Personal oil-based products / skin lotions / body lotions removed Wigs or hairpieces removed NA Smoking or tobacco materials removed NA Books / newspapers / magazines / loose paper removed Cologne, aftershave, perfume and deodorant removed Jewelry removed (may wrap wedding band) Make-up removed NA Hair care products  removed Battery operated devices (external) removed Heating patches and chemical warmers removed Titanium eyewear removed Nail polish cured greater than 10 hours NA Casting material cured greater than 10 hours NA Hearing aids removed NA Loose dentures or partials removed dentures removed Prosthetics have been removed NA Patient demonstrates correct use of air break device (if applicable) Patient concerns have been addressed Patient grounding bracelet on and cord attached to chamber Specifics for Inpatients (complete in addition to above) Medication sheet sent with patient NA Intravenous medications needed or due during therapy sent with patient NA Drainage tubes (e.g. nasogastric tube or chest tube secured and vented) NA Endotracheal or Tracheotomy tube secured NA Cuff deflated of air and inflated with saline NA Airway suctioned NA Notes Paper version used prior to treatment. Electronic Signature(s) Signed: 12/08/2022 3:19:46 PM By: Donavan Burnet CHT EMT BS , , Entered By: Donavan Burnet on 12/08/2022 15:19:45

## 2022-12-10 NOTE — Progress Notes (Signed)
[place], Nikan ([id number]) [id number] Page 1 of 2 Visit Report for 12/08/2022 Arrival Information Details Patient Name: Date of Service: [name], [name] 12/08/2022 12:00 PM Medical Record Number: [id number] Patient Account Number: [id number] Date of Birth/Sex: Treating RN: [birth year] (73 y.o. [name] Primary Care Veera Stapleton: [name] Other Clinician: Valeria Batman Referring Kendy Haston: Treating Rakim Moone/Extender: [name] Weeks in Treatment: 8 Visit Information History Since Last Visit All ordered tests and consults were completed: Yes Patient Arrived: Ambulatory Added or deleted any medications: No Arrival Time: 11:01 Any new allergies or adverse reactions: No Accompanied By: self Had a fall or experienced change in No Transfer Assistance: None activities of daily living that may affect Patient Identification Verified: Yes risk of falls: Secondary Verification Process Completed: Yes Signs or symptoms of abuse/neglect since last visito No Patient Requires Transmission-Based Precautions: No Hospitalized since last visit: No Patient Has Alerts: No Implantable device outside of the clinic excluding No cellular tissue based products placed in the center since last visit: Pain Present Now: No Electronic Signature(s) Signed: 12/08/2022 3:18:27 PM By: Donavan Burnet CHT EMT BS , , Previous Signature: 12/08/2022 3:17:43 PM Version By: Donavan Burnet CHT EMT BS , , Entered By: Donavan Burnet on 12/08/2022 15:18:27 -------------------------------------------------------------------------------- Encounter Discharge Information Details Patient Name: Date of Service: [name] [name], [name] 12/08/2022 12:00 PM Medical Record Number: [id number] Patient Account Number: [id number] Date of Birth/Sex: Treating RN: [birth year] (72 y.o. [name] Primary Care Jakhai Fant: [name] Other Clinician: Donavan Burnet Referring  Tylasia Fletchall: Treating Stirling Orton/Extender: [name] Weeks in Treatment: 8 Encounter Discharge Information Items Discharge Condition: Stable Ambulatory Status: Cane Discharge Destination: Home Transportation: Private Auto Accompanied By: self Schedule Follow-up Appointment: No Clinical Summary of Care: Electronic Signature(s) Signed: 12/08/2022 3:26:46 PM By: Donavan Burnet CHT EMT BS , , Entered By: Donavan Burnet on 12/08/2022 15:26:46 -------------------------------------------------------------------------------- [name] Details Patient Name: Date of Service: [name] [name], [name] 12/08/2022 12:00 PM Medical Record Number: [id number] Patient Account Number: [id number] Date of Birth/Sex: Treating RN: 11/13/1950 (72 y.o. [name] Primary Care Ifeoma Vallin: [name] Other Clinician: Valeria Batman Referring Byanca Kasper: Treating Shanya Ferriss/Extender: [name] Weeks in Treatment: 8 Vital Signs Time Taken: 11:07 Temperature (F): 97.7 [name] ([id number]) [id number] Page 2 of 2 Height (in): 72 Pulse (bpm): 108 Weight (lbs): 185 Respiratory Rate (breaths/min): 18 Body Mass Index (BMI): 25.1 Blood Pressure (mmHg): 183/67 Capillary Blood Glucose (mg/dl): 185 Reference Range: 80 - 120 mg / dl Electronic Signature(s) Signed: 12/08/2022 5:00:26 PM By: Donavan Burnet CHT EMT BS , , Previous Signature: 12/08/2022 3:18:10 PM Version By: Donavan Burnet CHT EMT BS , , Entered By: Donavan Burnet on 12/08/2022 17:00:26

## 2022-12-11 ENCOUNTER — Encounter (HOSPITAL_BASED_OUTPATIENT_CLINIC_OR_DEPARTMENT_OTHER): Payer: No Typology Code available for payment source | Admitting: General Surgery

## 2022-12-11 DIAGNOSIS — M86671 Other chronic osteomyelitis, right ankle and foot: Secondary | ICD-10-CM | POA: Diagnosis not present

## 2022-12-11 DIAGNOSIS — E11621 Type 2 diabetes mellitus with foot ulcer: Secondary | ICD-10-CM | POA: Diagnosis not present

## 2022-12-11 LAB — GLUCOSE, CAPILLARY
Glucose-Capillary: 318 mg/dL — ABNORMAL HIGH (ref 70–99)
Glucose-Capillary: 260 mg/dL — ABNORMAL HIGH (ref 70–99)

## 2022-12-11 NOTE — Progress Notes (Signed)
[place], Brewer ([id number]) [id number] Page 1 of 2 Visit Report for 12/11/2022 HBO Details Patient Name: Date of Service: [facility] [name], [name] 12/11/2022 12:00 PM Medical Record Number: [id number] Patient Account Number: [id number] Date of Birth/Sex: Treating RN: 08/29/1951 (72 y.o. 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 HBO Treatment Details Treatment Number: 26 Patient Type: Outpatient Chamber Type: Monoplace Chamber Serial #: [id number] Treatment Protocol: 2.0 ATA with 90 minutes oxygen, and no air breaks Treatment Details Compression Rate Down: 2.0 psi / minute De-Compression Rate Up: 2.0 psi / minute Air breaks and breathing Decompress Decompress Compress Tx Pressure Begins Reached periods Begins Ends (leave unused spaces blank) Chamber Pressure (ATA 1 2 ------2 1 ) Clock Time (24 hr) 11:37 11:46 - - - - - - 13:16 13:25 Treatment Length: 108 (minutes) Treatment Segments: 4 Vital Signs Capillary Blood Glucose Reference Range: 80 - 120 mg / dl HBO Diabetic Blood Glucose Intervention Range: <131 mg/dl or >249 mg/dl Time Vitals Blood Respiratory Capillary Blood Glucose Pulse Action Type: Pulse: Temperature: Taken: Pressure: Rate: Glucose (mg/dl): Meter #: Oximetry (%) Taken: Pre 11:23 172/59 108 16 98.4 318 Post 13:29 145/62 93 16 98.1 260 Treatment Response Treatment Toleration: Well Treatment Completion Status: Treatment Completed without Adverse Event Treatment Notes Dr. Celine [name] informed of the patient per and post blood sugars. Physician HBO Attestation: I certify that  I supervised this HBO treatment in accordance with Medicare guidelines. A trained emergency response team is readily available per Yes hospital policies and procedures. Continue HBOT as ordered. Yes Electronic Signature(s) Signed: 12/12/2022 7:47:53 AM By: [name] Maudlin MD FACS Previous Signature: 12/11/2022 2:38:10 PM Version By: [name] EMT Entered By: [name] on 12/12/2022 07:47:53 -------------------------------------------------------------------------------- HBO Safety Checklist Details Patient Name: Date of Service: [facility] [name], [name] 12/11/2022 12:00 PM Medical Record Number: [id number] Patient Account Number: [id number] [name], [name] ([id number]) [id number] Page 2 of 2 Date of Birth/Sex: Treating RN: 04-14-1951 (72 y.o. [name], [name] Primary Care [name]: Other Clinician: Verneita [name] Referring [name]: Treating [name]/Extender: [name] Weeks in Treatment: 8 HBO Safety Checklist Items Safety Checklist Consent Form Signed Patient voided / foley secured and emptied When did you last eato 1030 Last dose of injectable or oral agent yesterday Ostomy pouch emptied and vented if applicable NA All implantable devices assessed, documented and approved Dialysis shunt Intravenous access site secured and place NA Valuables secured Linens and cotton and cotton/polyester blend (less than 51% polyester) Personal oil-based products / skin lotions / body lotions removed Wigs or hairpieces removed NA Smoking or tobacco materials removed Books / newspapers / magazines / loose paper removed Cologne, aftershave, perfume and deodorant removed Jewelry removed (may wrap wedding band) Make-up removed NA Hair care products removed Battery operated devices (external) removed Heating patches and chemical warmers removed Titanium eyewear removed NA Nail polish cured greater than 10 hours NA Casting material  cured greater than 10 hours NA Hearing aids removed NA Loose dentures or partials removed removed by patient Prosthetics have been removed NA Patient demonstrates correct use of air break device (if applicable) Patient concerns have been addressed Patient grounding bracelet on and cord attached to chamber Specifics for Inpatients (complete in addition to above) Medication  sheet sent with patient NA Intravenous medications needed or due during therapy sent with patient NA Drainage tubes (e.g. nasogastric tube or chest tube secured and vented) NA Endotracheal or Tracheotomy tube secured NA Cuff deflated of air and inflated with saline NA Airway suctioned NA Notes The safety checklist was done before the treatment was started. Electronic Signature(s) Signed: 12/11/2022 1:48:25 PM By: [name] EMT Entered By: [name] on 12/11/2022 13:48:24

## 2022-12-11 NOTE — Progress Notes (Signed)
[place], Elior ([id number]) [id number] Page 1 of 2 Visit Report for 12/11/2022 Arrival Information Details Patient Name: Date of Service: [place], [name] 12/11/2022 12:00 PM Medical Record Number: [id number] Patient Account Number: [id number] Date of Birth/Sex: Treating RN: [birth year] (72 y.o. [name], [name] Primary Care David Rodriquez: [name] Other Clinician: Valeria Batman Referring Anarely Nicholls: Treating Edithe Dobbin/Extender: [name] Weeks in Treatment: 8 Visit Information History Since Last Visit All ordered tests and consults were completed: Yes Patient Arrived: Cane Added or deleted any medications: No Arrival Time: 11:11 Any new allergies or adverse reactions: No Accompanied By: None Had a fall or experienced change in No Transfer Assistance: None activities of daily living that may affect Patient Identification Verified: Yes risk of falls: Secondary Verification Process Completed: Yes Signs or symptoms of abuse/neglect since last visito No Patient Requires Transmission-Based Precautions: No Hospitalized since last visit: No Patient Has Alerts: No Implantable device outside of the clinic excluding No cellular tissue based products placed in the center since last visit: Pain Present Now: No Electronic Signature(s) Signed: 12/11/2022 1:22:45 PM By: Valeria Batman EMT Entered By: Valeria Batman on 12/11/2022 13:22:44 -------------------------------------------------------------------------------- Encounter Discharge Information Details Patient Name: Date of Service: [facility] [name], [name] 12/11/2022 12:00 PM Medical Record Number: [id number] Patient Account Number: [id number] Date of Birth/Sex: Treating RN: [birth year] (72 y.o. [name] Primary Care Lenn Volker: [name] Other Clinician: Valeria Batman Referring Lyndie Vanderloop: Treating Sarai January/Extender: [name] Weeks in Treatment: 8 Encounter Discharge  Information Items Discharge Condition: Stable Ambulatory Status: Cane Discharge Destination: Home Transportation: Private Auto Accompanied By: None Schedule Follow-up Appointment: Yes Clinical Summary of Care: Electronic Signature(s) Signed: 12/11/2022 2:39:45 PM By: Valeria Batman EMT Entered By: Valeria Batman on 12/11/2022 14:39:45 -------------------------------------------------------------------------------- Vitals Details Patient Name: Date of Service: [facility] [name], [name] 12/11/2022 12:00 PM Medical Record Number: [id number] Patient Account Number: [id number] Date of Birth/Sex: Treating RN: April 21, 1951 (72 y.o. [name] Primary Care Jarrell Armond: [name] Other Clinician: Valeria Batman Referring Caelin Rayl: Treating Nataleah Scioneaux/Extender: [name] Weeks in Treatment: 8 Vital Signs Time Taken: 11:23 Temperature (F): 98.4 [name], [name] ([id number]) [id number].pdf Page 2 of 2 Height (in): 72 Pulse (bpm): 108 Weight (lbs): 185 Respiratory Rate (breaths/min): 16 Body Mass Index (BMI): 25.1 Blood Pressure (mmHg): 172/59 Capillary Blood Glucose (mg/dl): 318 Reference Range: 80 - 120 mg / dl Electronic Signature(s) Signed: 12/11/2022 1:23:23 PM By: Valeria Batman EMT Entered By: Valeria Batman on 12/11/2022 13:23:23

## 2022-12-12 ENCOUNTER — Encounter (HOSPITAL_BASED_OUTPATIENT_CLINIC_OR_DEPARTMENT_OTHER): Payer: No Typology Code available for payment source | Admitting: General Surgery

## 2022-12-12 DIAGNOSIS — M86671 Other chronic osteomyelitis, right ankle and foot: Secondary | ICD-10-CM | POA: Diagnosis not present

## 2022-12-12 DIAGNOSIS — E11621 Type 2 diabetes mellitus with foot ulcer: Secondary | ICD-10-CM | POA: Diagnosis not present

## 2022-12-12 LAB — GLUCOSE, CAPILLARY
Glucose-Capillary: 204 mg/dL — ABNORMAL HIGH (ref 70–99)
Glucose-Capillary: 196 mg/dL — ABNORMAL HIGH (ref 70–99)

## 2022-12-12 NOTE — Progress Notes (Signed)
[place]iff, Ashby ([id number]) [id number] Page 1 of 2 Visit Report for 12/11/2022 Problem List Details Patient Name: Date of Service: [name], [name] 12/11/2022 12:00 PM Medical Record Number: [id number] Patient Account Number: [id number] Date of Birth/Sex: Treating RN: 11/25/1950 (72 y.o. 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, stage 5 10/12/2022 No Yes M86.9 Osteomyelitis, unspecified 10/12/2022 No Yes E11.42 Type 2 diabetes mellitus with diabetic polyneuropathy 10/12/2022 No Yes I10 Essential (primary) hypertension 10/12/2022 No Yes Inactive Problems Resolved Problems Electronic Signature(s) Signed: 12/11/2022 2:39:10 PM By: Valeria Batman EMT Signed: 12/12/2022 7:40:18 AM By: Fredirick Maudlin MD FACS Entered By: Valeria Batman on 12/11/2022 14:39:10 -------------------------------------------------------------------------------- SuperBill Details Patient Name: Date of Service: [facility] [name], [name] 12/11/2022 Medical Record Number: [id number] Patient Account Number: [id number] Date of Birth/Sex: Treating RN: 09-29-1951 (72 y.o. 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, stage 5 M86.9 Osteomyelitis, unspecified E11.42 Type 2 diabetes mellitus with diabetic polyneuropathy I10 Essential (primary) hypertension Facility Procedures : CPT4 Code Description: [id number]-(Facility Use Only) HBOT full body chamber, 36mn , ICD-10 Diagnosis Description M86.9 Osteomyelitis, unspecified L97.514 Non-pressure chronic ulcer of other part of right foo E11.42 Type 2 diabetes mellitus with  diabetic polyneuropathy E11.621 Type 2 diabetes mellitus with foot ulcer Modifier: t with necrosis Quantity: 4 of bone Physician Procedures : CPT4 Code Description Modifier [id number] PHYS HYPERBARIC OXYGEN THERAPY ICD-10 Diagnosis Description M86.9 Osteomyelitis, unspecified 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

## 2022-12-13 ENCOUNTER — Encounter (HOSPITAL_BASED_OUTPATIENT_CLINIC_OR_DEPARTMENT_OTHER): Payer: No Typology Code available for payment source | Admitting: Internal Medicine

## 2022-12-13 DIAGNOSIS — L97514 Non-pressure chronic ulcer of other 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: Secondary | ICD-10-CM

## 2022-12-13 DIAGNOSIS — M869 Osteomyelitis, unspecified: Secondary | ICD-10-CM

## 2022-12-13 DIAGNOSIS — E1142 Type 2 diabetes mellitus with diabetic polyneuropathy: Secondary | ICD-10-CM | POA: Diagnosis not present

## 2022-12-13 DIAGNOSIS — E11621 Type 2 diabetes mellitus with foot ulcer: Secondary | ICD-10-CM | POA: Diagnosis not present

## 2022-12-13 LAB — GLUCOSE, CAPILLARY
Glucose-Capillary: 176 mg/dL — ABNORMAL HIGH (ref 70–99)
Glucose-Capillary: 193 mg/dL — ABNORMAL HIGH (ref 70–99)

## 2022-12-13 NOTE — Progress Notes (Signed)
[place], Ceylon ([id number]) [id number] Page 1 of 2 Visit Report for 12/13/2022 Arrival Information Details Patient Name: Date of Service: [place], [name] 12/13/2022 12:00 PM Medical Record Number: [id number] Patient Account Number: [id number] Date of Birth/Sex: Treating RN: [birth year] (72 y.o. [name] Primary Care Maranda Marte: [name] Other Clinician: Valeria Batman Referring Calixto Pavel: Treating Rui Wordell/Extender: Melanee Left Weeks in Treatment: 8 Visit Information History Since Last Visit All ordered tests and consults were completed: Yes Patient Arrived: [name] Added or deleted any medications: No Arrival Time: 11:05 Any new allergies or adverse reactions: No Accompanied By: None Had a fall or experienced change in No Transfer Assistance: None activities of daily living that may affect Patient Identification Verified: Yes risk of falls: Secondary Verification Process Completed: Yes Signs or symptoms of abuse/neglect since last visito No Patient Requires Transmission-Based Precautions: No Hospitalized since last visit: No Patient Has Alerts: No Implantable device outside of the clinic excluding No cellular tissue based products placed in the center since last visit: Pain Present Now: No Electronic Signature(s) Signed: 12/13/2022 11:26:44 AM By: Valeria Batman EMT Entered By: Valeria Batman on 12/13/2022 11:26:43 -------------------------------------------------------------------------------- Encounter Discharge Information Details Patient Name: Date of Service: [facility] [name], [name] 12/13/2022 12:00 PM Medical Record Number: [id number] Patient Account Number: [id number] Date of Birth/Sex: Treating RN: [birth year] (72 y.o. [name] Primary Care Ayuub Penley: [name] Other Clinician: Valeria Batman Referring Christa Fasig: Treating Jaynee Winters/Extender: Melanee Left Weeks in Treatment: 8 Encounter  Discharge Information Items Discharge Condition: Stable Ambulatory Status: Cane Discharge Destination: Home Transportation: Private Auto Accompanied By: None Schedule Follow-up Appointment: Yes Clinical Summary of Care: Electronic Signature(s) Signed: 12/13/2022 1:33:28 PM By: Valeria Batman EMT Entered By: Valeria Batman on 12/13/2022 13:33:27 -------------------------------------------------------------------------------- Vitals Details Patient Name: Date of Service: [facility] [name], [name] 12/13/2022 12:00 PM Medical Record Number: [id number] Patient Account Number: [id number] Date of Birth/Sex: Treating RN: 03-29-1951 (72 y.o. [name] Primary Care [name]: [name] Other Clinician: Valeria Batman Referring Espen Bethel: Treating Rishita Petron/Extender: Melanee Left Weeks in Treatment: 8 Vital Signs Time Taken: 11:10 Capillary Blood Glucose (mg/dl): 176 [name], [name] ([id number]) [phone].pdf Page 2 of 2 Height (in): 72 Reference Range: 80 - 120 mg / dl Weight (lbs): 185 Body Mass Index (BMI): 25.1 Electronic Signature(s) Signed: 12/13/2022 11:27:03 AM By: Valeria Batman EMT Entered By: Valeria Batman on 12/13/2022 11:27:03

## 2022-12-13 NOTE — Progress Notes (Addendum)
[name] [name] ([id number]) [id number] Page 1 of 2 Visit Report for 12/13/2022 HBO Details Patient Name: Date of Service: [name], [name] 12/13/2022 12:00 PM Medical Record Number: [id number] Patient Account Number: [id number] Date of Birth/Sex: Treating RN: 14-Mar-1951 (72 y.o. 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 HBO Treatment Details Treatment Number: 28 Patient Type: Outpatient Chamber Type: Monoplace Chamber Serial #: [id number] Treatment Protocol: 2.0 ATA with 90 minutes oxygen, and no air breaks Treatment Details Compression Rate Down: 2.0 psi / minute De-Compression Rate Up: Air breaks and breathing Decompress Decompress Compress Tx Pressure Begins Reached periods Begins Ends (leave unused spaces blank) Chamber Pressure (ATA 1 2 ------2 1 ) Clock Time (24 hr) 11:22 11:31 - - - - - - 13:01 13:08 Treatment Length: 106 (minutes) Treatment Segments: 4 Vital Signs Capillary Blood Glucose Reference Range: 80 - 120 mg / dl HBO Diabetic Blood Glucose Intervention Range: <131 mg/dl or >249 mg/dl Time Vitals Blood Respiratory Capillary Blood Glucose Pulse Action Type: Pulse: Temperature: Taken: Pressure: Rate: Glucose (mg/dl): Meter #: Oximetry (%) Taken: Pre 11:10 176 Pre 11:16 179/71 103 18 97.9 Post 13:13 138/76 86 18 98.4 193 Treatment Response Treatment Toleration: Well Treatment Completion Status: Treatment Completed without Adverse Event Treatment Notes Dr. Heber [name] informed of the parent heart rate of 103. Physician HBO Attestation: I certify that I  supervised this HBO treatment in accordance with Medicare guidelines. A trained emergency response team is readily available per Yes hospital policies and procedures. Continue HBOT as ordered. Yes Electronic Signature(s) Signed: 12/14/2022 3:34:03 PM By: Kalman Shan DO Previous Signature: 12/13/2022 1:31:47 PM Version By: Valeria Batman EMT Previous Signature: 12/13/2022 11:38:08 AM Version By: Valeria Batman EMT Entered By: Kalman Shan on 12/14/2022 12:50:56 HBO Safety Checklist Details -------------------------------------------------------------------------------- [name] ([id number]) [phone] Page 2 of 2 Patient Name: Date of ServiceJodene [name], [name] 12/13/2022 12:00 PM Medical Record Number: [id number] Patient Account Number: [id number] Date of Birth/Sex: Treating RN: [birth year] (72 y.o. [name] Primary Care [name] [name]: [name] Other Clinician: Valeria Batman Referring [name] [name]: Treating [name] [name]/Extender: Melanee Left Weeks in Treatment: 8 HBO Safety Checklist Items Safety Checklist Consent Form Signed Patient voided / foley secured and emptied When did you last eato 1030 Last dose of injectable or oral agent Yesterday Ostomy pouch emptied and vented if applicable NA All implantable devices assessed, documented and approved Libre2 Dialysis shunt Intravenous access site secured and place NA Valuables secured Linens and cotton and cotton/polyester blend (less than 51% polyester) Personal oil-based products / skin lotions / body lotions removed Wigs or hairpieces removed NA Smoking or tobacco materials removed Books / newspapers / magazines / loose paper removed Cologne, aftershave, perfume and deodorant removed Jewelry removed (may wrap wedding band) Make-up removed NA Hair care products removed Battery operated devices (external) removed Heating patches and chemical warmers removed Titanium  eyewear removed NA Nail polish cured greater than 10 hours NA Casting material cured greater than 10 hours NA Hearing aids removed NA Loose dentures or partials removed removed by patient Prosthetics have been removed NA Patient demonstrates correct use of air break device (if applicable) Patient concerns have been addressed Patient grounding bracelet on and cord attached to chamber Specifics for  Inpatients (complete in addition to above) Medication sheet sent with patient NA Intravenous medications needed or due during therapy sent with patient NA Drainage tubes (e.g. nasogastric tube or chest tube secured and vented) NA Endotracheal or Tracheotomy tube secured NA Cuff deflated of air and inflated with saline NA Airway suctioned NA Notes The safety checklist was done before the treatment was started. Electronic Signature(s) Signed: 12/13/2022 11:28:46 AM By: Valeria Batman EMT Entered By: Valeria Batman on 12/13/2022 11:28:45

## 2022-12-13 NOTE — Progress Notes (Addendum)
[name], [name] ([id number]) [id number] Page 1 of 2 Visit Report for 12/12/2022 HBO Details Patient Name: Date of Service: [facility] [name], [name] 12/12/2022 12:00 PM Medical Record Number: [id number] Patient Account Number: [id number] Date of Birth/Sex: Treating RN: Mar 05, 1951 (72 y.o. 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 HBO Treatment Details Treatment Number: 27 Patient Type: Outpatient Chamber Type: Monoplace Chamber Serial #: [id number] Treatment Protocol: 2.0 ATA with 90 minutes oxygen, and no air breaks Treatment Details Compression Rate Down: 2.0 psi / minute De-Compression Rate Up: 2.0 psi / minute Air breaks and breathing Decompress Decompress Compress Tx Pressure Begins Reached periods Begins Ends (leave unused spaces blank) Chamber Pressure (ATA 1 2 ------2 1 ) Clock Time (24 hr) 11:31 11:41 - - - - - - 13:11 13:21 Treatment Length: 110 (minutes) Treatment Segments: 4 Vital Signs Capillary Blood Glucose Reference Range: 80 - 120 mg / dl HBO Diabetic Blood Glucose Intervention Range: <131 mg/dl or >249 mg/dl Type: Time Vitals Blood Respiratory Capillary Blood Glucose Pulse Action Pulse: Temperature: Taken: Pressure: Rate: Glucose (mg/dl): Meter #: Oximetry (%) Taken: Pre 11:23 170/74 102 18 98.4 204 1 none per protocol Post 13:26 135/106 102 18 98.4 196 1 manual bp taken Post 13:29 142/72 (manual BP) Treatment Response Treatment Toleration: Well Treatment Completion Status: Treatment Completed without Adverse Event Treatment Notes Patient arrived, blood  glucose measured at 204 mg/dL. Patient prepared for treatment. Other vital signs were within acceptable range. After performing safety check, Mr. Kapaun was placed in the chamber which was then compressed with 100% oxygen at a rate of 2 psi/min after confirming normal ear equalization. Patient tolerated entire treatment and then subsequent decompression of the chamber at 2 psi/min. Patient was stable upon discharge. Physician HBO Attestation: I certify that I supervised this HBO treatment in accordance with Medicare guidelines. A trained emergency response team is readily available per Yes hospital policies and procedures. Continue HBOT as ordered. Yes Electronic Signature(s) Signed: 12/20/2022 9:46:31 AM By: Fredirick Maudlin MD FACS Previous Signature: 12/12/2022 4:32:45 PM Version By: Donavan Burnet CHT EMT BS , , Entered By: Fredirick Maudlin on 12/20/2022 09:46:30 HBO Safety Checklist Details -------------------------------------------------------------------------------- [name] ([id number] Page 2 of 2 Patient Name: Date of ServiceJodene [name], [name] 12/12/2022 12:00 PM Medical Record Number: [id number] Patient Account Number: [id number] Date of Birth/Sex: Treating RN: December 05, 1950 (72 y.o. [name], [name] Primary Care Brysan Mcevoy: [name] Other Clinician: Donavan Burnet Referring Parish Dubose: Treating Jereme Loren/Extender: [name] Weeks in Treatment: 8 HBO Safety Checklist Items Safety Checklist Consent Form Signed Patient voided / foley secured and emptied When did you last eato 1100 Last dose of injectable or oral agent last evening Ostomy pouch emptied and vented if applicable NA All implantable devices assessed, documented and approved NA Intravenous access site secured and place NA Valuables secured Linens and cotton and cotton/polyester blend (less than 51% polyester) Personal oil-based products / skin  lotions / body lotions removed Wigs or hairpieces removed NA Smoking or tobacco materials removed NA Books / newspapers / magazines / loose paper removed Cologne, aftershave, perfume and deodorant removed Jewelry removed (may wrap wedding band) Make-up removed Hair care products removed Battery operated devices (external) removed Heating patches and  chemical warmers removed Titanium eyewear removed Nail polish cured greater than 10 hours NA Casting material cured greater than 10 hours NA Hearing aids removed NA Loose dentures or partials removed dentures removed Prosthetics have been removed NA Patient demonstrates correct use of air break device (if applicable) Patient concerns have been addressed Patient grounding bracelet on and cord attached to chamber Specifics for Inpatients (complete in addition to above) Medication sheet sent with patient NA Intravenous medications needed or due during therapy sent with patient NA Drainage tubes (e.g. nasogastric tube or chest tube secured and vented) NA Endotracheal or Tracheotomy tube secured NA Cuff deflated of air and inflated with saline NA Airway suctioned NA Notes Paper version used prior to treatment start. Electronic Signature(s) Signed: 12/12/2022 4:20:05 PM By: Donavan Burnet CHT EMT BS , , Entered By: Donavan Burnet on 12/12/2022 16:20:04

## 2022-12-13 NOTE — Progress Notes (Signed)
[place], Jakel ([id number]) [id number] Page 1 of 2 Visit Report for 12/12/2022 Arrival Information Details Patient Name: Date of Service: [place], [name] 12/12/2022 12:00 PM Medical Record Number: [id number] Patient Account Number: [id number] Date of Birth/Sex: Treating RN: [birth year] (72 y.o. [name], [name] Primary Care Arin Peral: [name] Other Clinician: Donavan Burnet Referring Ruwayda Curet: Treating Doristine Shehan/Extender: [name] Weeks in Treatment: 8 Visit Information History Since Last Visit All ordered tests and consults were completed: Yes Patient Arrived: [name] Added or deleted any medications: No Arrival Time: 11:03 Any new allergies or adverse reactions: No Accompanied By: self Had a fall or experienced change in No Transfer Assistance: None activities of daily living that may affect Patient Identification Verified: Yes risk of falls: Secondary Verification Process Completed: Yes Signs or symptoms of abuse/neglect since last visito No Patient Requires Transmission-Based Precautions: No Hospitalized since last visit: No Patient Has Alerts: No Implantable device outside of the clinic excluding No cellular tissue based products placed in the center since last visit: Pain Present Now: No Electronic Signature(s) Signed: 12/12/2022 4:18:41 PM By: Donavan Burnet CHT EMT BS , , Entered By: Donavan Burnet on 12/12/2022 16:18:41 -------------------------------------------------------------------------------- Encounter Discharge Information Details Patient Name: Date of Service: [facility] [name], [name] 12/12/2022 12:00 PM Medical Record Number: [id number] Patient Account Number: [id number] Date of Birth/Sex: Treating RN: 06-19-1951 (72 y.o. [name], [name] Primary Care Kento Gossman: [name] Other Clinician: Donavan Burnet Referring Titiana Severa: Treating Evalise Abruzzese/Extender: [name] Weeks in Treatment:  8 Encounter Discharge Information Items Discharge Condition: Stable Ambulatory Status: Cane Discharge Destination: Home Transportation: Private Auto Accompanied By: self Schedule Follow-up Appointment: No Clinical Summary of Care: Electronic Signature(s) Signed: 12/12/2022 4:33:56 PM By: Donavan Burnet CHT EMT BS , , Entered By: Donavan Burnet on 12/12/2022 16:33:55 -------------------------------------------------------------------------------- Vitals Details Patient Name: Date of Service: [facility] [name], [name] 12/12/2022 12:00 PM Medical Record Number: [id number] Patient Account Number: [id number] Date of Birth/Sex: Treating RN: Jun 18, 1951 (72 y.o. [name] Primary Care Layonna Dobie: [name] Other Clinician: Donavan Burnet Referring Gari Trovato: Treating Maxie Slovacek/Extender: [name] Weeks in Treatment: 8 Vital Signs Time Taken: 11:23 Temperature (F): 98.4 [name], [name] ([id number]) [id number] Page 2 of 2 Height (in): 72 Pulse (bpm): 102 Weight (lbs): 185 Respiratory Rate (breaths/min): 18 Body Mass Index (BMI): 25.1 Blood Pressure (mmHg): 170/74 Capillary Blood Glucose (mg/dl): 204 Reference Range: 80 - 120 mg / dl Electronic Signature(s) Signed: 12/12/2022 4:19:04 PM By: Donavan Burnet CHT EMT BS , , Entered By: Donavan Burnet on 12/12/2022 16:19:04

## 2022-12-14 ENCOUNTER — Encounter (HOSPITAL_BASED_OUTPATIENT_CLINIC_OR_DEPARTMENT_OTHER): Payer: No Typology Code available for payment source | Admitting: Internal Medicine

## 2022-12-14 ENCOUNTER — Telehealth: Payer: Self-pay | Admitting: Family Medicine

## 2022-12-14 DIAGNOSIS — M869 Osteomyelitis, unspecified: Secondary | ICD-10-CM

## 2022-12-14 DIAGNOSIS — E1142 Type 2 diabetes mellitus with diabetic polyneuropathy: Secondary | ICD-10-CM

## 2022-12-14 DIAGNOSIS — L97514 Non-pressure chronic ulcer of other 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: Secondary | ICD-10-CM

## 2022-12-14 DIAGNOSIS — E11621 Type 2 diabetes mellitus with foot ulcer: Secondary | ICD-10-CM

## 2022-12-14 LAB — GLUCOSE, CAPILLARY
Glucose-Capillary: 191 mg/dL — ABNORMAL HIGH (ref 70–99)
Glucose-Capillary: 195 mg/dL — ABNORMAL HIGH (ref 70–99)

## 2022-12-14 NOTE — Progress Notes (Addendum)
[place], [place] ([id number]) [id number] Page 1 of 2 Visit Report for 12/14/2022 HBO Details Patient Name: Date of Service: [name] [name], [name] 12/14/2022 12:00 PM Medical Record Number: [id number] Patient Account Number: [id number] Date of Birth/Sex: Treating RN: 1951/05/13 (72 y.o. 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 HBO Treatment Details Treatment Number: 29 Patient Type: Outpatient Chamber Type: Monoplace Chamber Serial #: [id number] Treatment Protocol: 2.0 ATA with 90 minutes oxygen, and no air breaks Treatment Details Compression Rate Down: 2.0 psi / minute De-Compression Rate Up: 2.0 psi / minute Air breaks and breathing Decompress Decompress Compress Tx Pressure Begins Reached periods Begins Ends (leave unused spaces blank) Chamber Pressure (ATA 1 2 ------2 1 ) Clock Time (24 hr) 11:22 11:32 - - - - - - 13:02 13:10 Treatment Length: 108 (minutes) Treatment Segments: 4 Vital Signs Capillary Blood Glucose Reference Range: 80 - 120 mg / dl HBO Diabetic Blood Glucose Intervention Range: <131 mg/dl or >249 mg/dl Time Vitals Blood Respiratory Capillary Blood Glucose Pulse Action Type: Pulse: Temperature: Taken: Pressure: Rate: Glucose (mg/dl): Meter #: Oximetry (%) Taken: Pre 11:06 183/79 105 18 98.1 191 Post 13:15 153/68 84 20 97.6 195 Treatment Response Treatment Toleration: Well Treatment Completion Status: Treatment Completed without Adverse Event Physician HBO Attestation: I certify that I supervised this HBO treatment in accordance with Medicare guidelines. A  trained emergency response team is readily available per Yes hospital policies and procedures. Continue HBOT as ordered. Yes Electronic Signature(s) Signed: 12/15/2022 1:36:39 PM By: Kalman Shan DO Previous Signature: 12/14/2022 1:30:46 PM Version By: Valeria Batman EMT Previous Signature: 12/14/2022 3:34:03 PM Version By: Kalman Shan DO Entered By: Kalman Shan on 12/15/2022 13:33:58 -------------------------------------------------------------------------------- HBO Safety Checklist Details Patient Name: Date of Service: [facility] [name], [name] 12/14/2022 12:00 PM Medical Record Number: [id number] Patient Account Number: [id number] Date of Birth/Sex: Treating RN: [birth year] (72 y.o. [name] Primary Care Clayborne Divis: [name] Other Clinician: Valeria Batman [place], [name] ([id number]) [id number] Page 2 of 2 Referring Shadi Sessler: Treating Antwanette Wesche/Extender: Melanee Left Weeks in Treatment: 9 HBO Safety Checklist Items Safety Checklist Consent Form Signed Patient voided / foley secured and emptied When did you last eato 1015 Last dose of injectable or oral agent Yesterday Ostomy pouch emptied and vented if applicable NA All implantable devices assessed, documented and approved Libre2 Dialysis shunt Intravenous access site secured and place NA Valuables secured Linens and cotton and cotton/polyester blend (less than 51% polyester) Personal oil-based products / skin lotions / body lotions removed Wigs or hairpieces removed NA Smoking or tobacco materials removed Books / newspapers / magazines / loose paper removed Cologne, aftershave, perfume and deodorant removed Jewelry removed (may wrap wedding band) Make-up removed NA Hair care products removed Battery operated devices (external) removed Heating patches and chemical warmers removed Titanium eyewear removed NA Nail polish cured greater than 10 hours NA Casting material  cured greater than 10 hours NA Hearing aids removed NA Loose dentures or partials removed removed by patient Prosthetics have been removed NA Patient demonstrates correct use of air break device (if applicable) Patient concerns have been addressed Patient grounding bracelet on and cord attached to chamber Specifics for Inpatients (complete in addition to above) Medication sheet sent with  patient NA Intravenous medications needed or due during therapy sent with patient NA Drainage tubes (e.g. nasogastric tube or chest tube secured and vented) NA Endotracheal or Tracheotomy tube secured NA Cuff deflated of air and inflated with saline NA Airway suctioned NA Notes The safety checklist was done before the treatment was started. Electronic Signature(s) Signed: 12/14/2022 1:29:33 PM By: Valeria Batman EMT Entered By: Valeria Batman on 12/14/2022 13:29:33

## 2022-12-14 NOTE — Progress Notes (Signed)
[place], [place] ([id number]) [id number] Page 1 of 2 Visit Report for 12/14/2022 Arrival Information Details Patient Name: Date of Service: [name], [name] 12/14/2022 12:00 PM Medical Record Number: [id number] Patient Account Number: [id number] Date of Birth/Sex: Treating RN: [birth year] (72 y.o. [name] Primary Care Vergie Zahm: [name] Other Clinician: Valeria Batman Referring Dameisha Tschida: Treating Corsica Franson/Extender: Melanee Left Weeks in Treatment: 9 Visit Information History Since Last Visit All ordered tests and consults were completed: Yes Patient Arrived: Ambulatory Added or deleted any medications: No Arrival Time: 11:02 Any new allergies or adverse reactions: No Accompanied By: None Had a fall or experienced change in No Transfer Assistance: None activities of daily living that may affect Patient Identification Verified: Yes risk of falls: Secondary Verification Process Completed: Yes Signs or symptoms of abuse/neglect since last visito No Patient Requires Transmission-Based Precautions: No Hospitalized since last visit: No Patient Has Alerts: No Implantable device outside of the clinic excluding No cellular tissue based products placed in the center since last visit: Pain Present Now: No Electronic Signature(s) Signed: 12/14/2022 1:26:52 PM By: Valeria Batman EMT Entered By: Valeria Batman on 12/14/2022 13:26:52 -------------------------------------------------------------------------------- Encounter Discharge Information Details Patient Name: Date of Service: [facility] [name], [name] 12/14/2022 12:00 PM Medical Record Number: [id number] Patient Account Number: [id number] Date of Birth/Sex: Treating RN: 05-Aug-1951 (72 y.o. [name] Primary Care Vella Colquitt: [name] Other Clinician: Valeria Batman Referring Ronie Fleeger: Treating Mysty Kielty/Extender: Melanee Left Weeks in Treatment: 9 Encounter  Discharge Information Items Discharge Condition: Stable Ambulatory Status: Cane Discharge Destination: Home Transportation: Private Auto Accompanied By: None Schedule Follow-up Appointment: Yes Clinical Summary of Care: Electronic Signature(s) Signed: 12/14/2022 1:31:49 PM By: Valeria Batman EMT Entered By: Valeria Batman on 12/14/2022 13:31:48 -------------------------------------------------------------------------------- Vitals Details Patient Name: Date of Service: [facility] [name], [name] 12/14/2022 12:00 PM Medical Record Number: [id number] Patient Account Number: [id number] Date of Birth/Sex: Treating RN: 1951/04/17 (72 y.o. [name] Primary Care Demtrius Rounds: [name] Other Clinician: Valeria Batman Referring Chantele Corado: Treating Mikey Maffett/Extender: Melanee Left Weeks in Treatment: 9 Vital Signs Time Taken: 11:06 Temperature (F): 98.1 [place], [place] ([id number]) [phone].pdf Page 2 of 2 Height (in): 72 Pulse (bpm): 105 Weight (lbs): 185 Respiratory Rate (breaths/min): 18 Body Mass Index (BMI): 25.1 Blood Pressure (mmHg): 183/79 Capillary Blood Glucose (mg/dl): 191 Reference Range: 80 - 120 mg / dl Electronic Signature(s) Signed: 12/14/2022 1:28:21 PM By: Valeria Batman EMT Entered By: Valeria Batman on 12/14/2022 13:28:21

## 2022-12-14 NOTE — Telephone Encounter (Signed)
Called patient to schedule Medicare Annual Wellness Visit (AWV). Left message for patient to call back and schedule Medicare Annual Wellness Visit (AWV).  Last date of AWV: due 01/31/2019 awvi per [facility]  Please schedule an appointment at any time with Loma Sousa, [facility] .  If any questions, please contact me at [phone].  Thank you,  Colletta [place],  [place] [facility] Direct Dial ??[phone]

## 2022-12-15 ENCOUNTER — Encounter (HOSPITAL_BASED_OUTPATIENT_CLINIC_OR_DEPARTMENT_OTHER): Payer: No Typology Code available for payment source | Admitting: Internal Medicine

## 2022-12-15 DIAGNOSIS — E1142 Type 2 diabetes mellitus with diabetic polyneuropathy: Secondary | ICD-10-CM | POA: Diagnosis not present

## 2022-12-15 DIAGNOSIS — E11621 Type 2 diabetes mellitus with foot ulcer: Secondary | ICD-10-CM | POA: Diagnosis not present

## 2022-12-15 DIAGNOSIS — M869 Osteomyelitis, unspecified: Secondary | ICD-10-CM | POA: Diagnosis not present

## 2022-12-15 DIAGNOSIS — L97514 Non-pressure chronic ulcer of other 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: Secondary | ICD-10-CM | POA: Diagnosis not present

## 2022-12-15 LAB — GLUCOSE, CAPILLARY
Glucose-Capillary: 143 mg/dL — ABNORMAL HIGH (ref 70–99)
Glucose-Capillary: 215 mg/dL — ABNORMAL HIGH (ref 70–99)

## 2022-12-15 NOTE — Progress Notes (Signed)
SYLUS, CHIARO ([id number]) [id number] Page 1 of 1 Visit Report for 12/14/2022 SuperBill Details Patient Name: Date of Service: [name], [name] 12/14/2022 Medical Record Number: [id number] Patient Account Number: [id number] Date of Birth/Sex: Treating RN: July 20, 1951 (72 y.o. 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, stage 5 M86.9 Osteomyelitis, unspecified E11.42 Type 2 diabetes mellitus with diabetic polyneuropathy I10 Essential (primary) hypertension Facility Procedures CPT4 Code Description Modifier Quantity [id number]-(Facility Use Only) HBOT full body chamber, 23min , 4 ICD-10 Diagnosis Description M86.9 Osteomyelitis, unspecified 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

## 2022-12-15 NOTE — Progress Notes (Signed)
YICHEN, SIVERS ([id number]) [id number] Page 1 of 2 Visit Report for 12/13/2022 Problem List Details Patient Name: Date of Service: [name], [name] 12/13/2022 12:00 PM Medical Record Number: [id number] Patient Account Number: [id number] Date of Birth/Sex: Treating RN: 1951/09/24 (72 y.o. 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.621 Type 2 diabetes mellitus with foot ulcer 10/12/2022 No Yes N18.5 Chronic kidney disease, stage 5 10/12/2022 No Yes M86.9 Osteomyelitis, unspecified 10/12/2022 No Yes E11.42 Type 2 diabetes mellitus with diabetic polyneuropathy 10/12/2022 No Yes I10 Essential (primary) hypertension 10/12/2022 No Yes Inactive Problems Resolved Problems Electronic Signature(s) Signed: 12/13/2022 1:32:52 PM By: Valeria Batman EMT Signed: 12/14/2022 3:34:03 PM By: Kalman Shan DO Entered By: Valeria Batman on 12/13/2022 13:32:51 -------------------------------------------------------------------------------- SuperBill Details Patient Name: Date of Service: [name], [name] [name] 12/13/2022 Medical Record Number: [id number] Patient Account Number: [id number] Date of Birth/Sex: Treating RN: [birth year] (72 y.o. 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.621 Type 2 diabetes mellitus with foot ulcer [name], [name] ([id number])  [phone].pdf Page 2 of 2 N18.5 Chronic kidney disease, stage 5 M86.9 Osteomyelitis, unspecified E11.42 Type 2 diabetes mellitus with diabetic polyneuropathy I10 Essential (primary) hypertension Facility Procedures : CPT4 Code Description: [id number] G0277-(Facility Use Only) HBOT full body chamber, 88min , ICD-10 Diagnosis Description M86.9 Osteomyelitis, unspecified L97.514 Non-pressure chronic ulcer of other part of right foo E11.42 Type 2 diabetes mellitus with  diabetic polyneuropathy E11.621 Type 2 diabetes mellitus with foot ulcer Modifier: t with necrosis Quantity: 4 of bone Physician Procedures : CPT4 Code Description Modifier [id number] - WC PHYS HYPERBARIC OXYGEN THERAPY ICD-10 Diagnosis Description M86.9 Osteomyelitis, unspecified 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

## 2022-12-15 NOTE — Progress Notes (Signed)
[place], [place] ([id number]) [id number] Page 1 of 2 Visit Report for 12/15/2022 HBO Details Patient Name: Date of Service: [facility] [name], [name] 12/15/2022 12:00 PM Medical Record Number: [id number] Patient Account Number: [id number] Date of Birth/Sex: Treating RN: 04/25/1951 (72 y.o. 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 HBO Treatment Details Treatment Number: 30 Patient Type: Outpatient Chamber Type: Monoplace Chamber Serial #: [id number] Treatment Protocol: 2.0 ATA with 90 minutes oxygen, and no air breaks Treatment Details Compression Rate Down: 2.0 psi / minute De-Compression Rate Up: Air breaks and breathing Decompress Decompress Compress Tx Pressure Begins Reached periods Begins Ends (leave unused spaces blank) Chamber Pressure (ATA 1 2 ------2 1 ) Clock Time (24 hr) 11:17 11:26 - - - - - - 12:57 13:06 Treatment Length: 109 (minutes) Treatment Segments: 4 Vital Signs Capillary Blood Glucose Reference Range: 80 - 120 mg / dl HBO Diabetic Blood Glucose Intervention Range: <131 mg/dl or >249 mg/dl Time Vitals Blood Respiratory Capillary Blood Glucose Pulse Action Type: Pulse: Temperature: Taken: Pressure: Rate: Glucose (mg/dl): Meter #: Oximetry (%) Taken: Pre 11:02 181/74 106 18 97.9 143 Post 13:08 147/68 89 20 97.8 215 Treatment Response Treatment Toleration: Well Treatment Completion Status: Treatment Completed without Adverse Event Physician HBO Attestation: I certify that I supervised this HBO treatment in accordance with Medicare guidelines. A trained emergency  response team is readily available per Yes hospital policies and procedures. Continue HBOT as ordered. Yes Electronic Signature(s) Signed: 12/15/2022 3:54:15 PM By: Kalman Shan DO Previous Signature: 12/15/2022 2:54:57 PM Version By: Valeria Batman EMT Previous Signature: 12/15/2022 1:15:30 PM Version By: Valeria Batman EMT Previous Signature: 12/15/2022 1:36:39 PM Version By: Kalman Shan DO Previous Signature: 12/15/2022 12:13:22 PM Version By: Valeria Batman EMT Entered By: Kalman Shan on 12/15/2022 15:36:14 -------------------------------------------------------------------------------- HBO Safety Checklist Details Patient Name: Date of Service: [facility] [name], [name] 12/15/2022 12:00 PM Medical Record Number: [id number] Patient Account Number: [id number] Date of Birth/Sex: Treating RN: 1951-06-20 (72 y.o. [name] [place], [name] [id number] Page 2 of 2 Primary Care Alverto Shedd: [name] Other Clinician: Valeria Batman Referring Aslee Such: Treating Mathieu Schloemer/Extender: Melanee Left Weeks in Treatment: 9 HBO Safety Checklist Items Safety Checklist Consent Form Signed Patient voided / foley secured and emptied When did you last eato 1030 Last dose of injectable or oral agent Last night Ostomy pouch emptied and vented if applicable NA All implantable devices assessed, documented and approved Libre2 Dialysis shunt Intravenous access site secured and place NA Valuables secured Linens and cotton and cotton/polyester blend (less than 51% polyester) Personal oil-based products / skin lotions / body lotions removed Wigs or hairpieces removed NA Smoking or tobacco materials removed Books / newspapers / magazines / loose paper removed Cologne, aftershave, perfume and deodorant removed Jewelry removed (may wrap wedding band) Make-up removed NA Hair care products removed Battery operated devices (external) removed Heating  patches and chemical warmers removed Titanium eyewear removed NA Nail polish cured greater than 10 hours NA Casting material cured greater than 10 hours NA Hearing aids removed NA Loose dentures or partials removed removed by patient Prosthetics have been removed NA Patient demonstrates correct use of air break device (if applicable) Patient concerns have been addressed Patient grounding bracelet on  and cord attached to chamber Specifics for Inpatients (complete in addition to above) Medication sheet sent with patient NA Intravenous medications needed or due during therapy sent with patient NA Drainage tubes (e.g. nasogastric tube or chest tube secured and vented) NA Endotracheal or Tracheotomy tube secured NA Cuff deflated of air and inflated with saline NA Airway suctioned NA Notes The safety checklist was done before the treatment was started. Electronic Signature(s) Signed: 12/15/2022 12:12:45 PM By: Valeria Batman EMT Entered By: Valeria Batman on 12/15/2022 12:12:45

## 2022-12-15 NOTE — Progress Notes (Signed)
IZAIAS, MICHALSKI ([id number]) [id number] Page 1 of 2 Visit Report for 12/15/2022 Problem List Details Patient Name: Date of Service: [name], [name] 12/15/2022 12:00 PM Medical Record Number: [id number] Patient Account Number: [id number] Date of Birth/Sex: Treating RN: Oct 19, 1950 (72 y.o. 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 E11.621 Type 2 diabetes mellitus with foot ulcer 10/12/2022 No Yes N18.5 Chronic kidney disease, stage 5 10/12/2022 No Yes M86.9 Osteomyelitis, unspecified 10/12/2022 No Yes E11.42 Type 2 diabetes mellitus with diabetic polyneuropathy 10/12/2022 No Yes I10 Essential (primary) hypertension 10/12/2022 No Yes Inactive Problems Resolved Problems Electronic Signature(s) Signed: 12/15/2022 1:16:08 PM By: Valeria Batman EMT Signed: 12/15/2022 1:36:39 PM By: Kalman Shan DO Entered By: Valeria Batman on 12/15/2022 13:16:08 -------------------------------------------------------------------------------- SuperBill Details Patient Name: Date of Service: [name], [name] 12/15/2022 Medical Record Number: [id number] Patient Account Number: [id number] Date of Birth/Sex: Treating RN: 07/26/1951 (72 y.o. [name] Primary Care Provider: [name] Other Clinician: Valeria Batman Referring Provider: Treating Provider/Extender: Melanee Left Weeks in Treatment: 9 Diagnosis Coding ICD-10 Codes Code Description [id number] 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.621 Type 2 diabetes mellitus with foot ulcer [name], [name] ([id number]) [phone].pdf  Page 2 of 2 N18.5 Chronic kidney disease, stage 5 M86.9 Osteomyelitis, unspecified E11.42 Type 2 diabetes mellitus with diabetic polyneuropathy I10 Essential (primary) hypertension Facility Procedures : CPT4 Code Description: [id number] G0277-(Facility Use Only) HBOT full body chamber, 46min , ICD-10 Diagnosis Description M86.9 Osteomyelitis, unspecified L97.514 Non-pressure chronic ulcer of other part of right foo E11.42 Type 2 diabetes mellitus with  diabetic polyneuropathy E11.621 Type 2 diabetes mellitus with foot ulcer Modifier: t with necrosis Quantity: 4 of bone Physician Procedures : CPT4 Code Description Modifier [id number] - WC PHYS HYPERBARIC OXYGEN THERAPY ICD-10 Diagnosis Description M86.9 Osteomyelitis, unspecified 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

## 2022-12-15 NOTE — Progress Notes (Signed)
[place] [name], Richerd ([id number]) [id number] Page 1 of 2 Visit Report for 12/15/2022 Arrival Information Details Patient Name: Date of Service: [name], [name] 12/15/2022 12:00 PM Medical Record Number: [id number] Patient Account Number: [id number] Date of Birth/Sex: Treating RN: [birth year] (72 y.o. [name] Primary Care Hyun Marsalis: [name] Other Clinician: Valeria Batman Referring Neill Jurewicz: Treating Terah Robey/Extender: Melanee Left Weeks in Treatment: 9 Visit Information History Since Last Visit All ordered tests and consults were completed: Yes Patient Arrived: [name] Added or deleted any medications: No Arrival Time: 12:04 Any new allergies or adverse reactions: No Accompanied By: None Had a fall or experienced change in No Transfer Assistance: None activities of daily living that may affect Patient Identification Verified: Yes risk of falls: Secondary Verification Process Completed: Yes Signs or symptoms of abuse/neglect since last visito No Patient Requires Transmission-Based Precautions: No Hospitalized since last visit: No Patient Has Alerts: No Implantable device outside of the clinic excluding No cellular tissue based products placed in the center since last visit: Pain Present Now: No Electronic Signature(s) Signed: 12/15/2022 12:11:15 PM By: Valeria Batman EMT Entered By: Valeria Batman on 12/15/2022 12:11:15 -------------------------------------------------------------------------------- Encounter Discharge Information Details Patient Name: Date of Service: [name] 12/15/2022 12:00 PM Medical Record Number: [id number] Patient Account Number: [id number] Date of Birth/Sex: Treating RN: 06-15-1951 (72 y.o. [name] Primary Care Tzivia Oneil: [name] Other Clinician: Valeria Batman Referring Bedford Winsor: Treating Shawanna Zanders/Extender: Melanee Left Weeks in Treatment: 9 Encounter Discharge  Information Items Discharge Condition: Stable Ambulatory Status: Cane Discharge Destination: Home Transportation: Private Auto Accompanied By: None Schedule Follow-up Appointment: Yes Clinical Summary of Care: Electronic Signature(s) Signed: 12/15/2022 1:16:43 PM By: Valeria Batman EMT Entered By: Valeria Batman on 12/15/2022 13:16:43 -------------------------------------------------------------------------------- Vitals Details Patient Name: Date of Service: [name] 12/15/2022 12:00 PM Medical Record Number: [id number] Patient Account Number: [id number] Date of Birth/Sex: Treating RN: 07/18/1951 (72 y.o. [name] Primary Care Jaymeson Mengel: [name] Other Clinician: Valeria Batman Referring Taras Rask: Treating Johnie Makki/Extender: Melanee Left Weeks in Treatment: 9 Vital Signs Time Taken: 11:02 Capillary Blood Glucose (mg/dl): 143 [name] ([id number]) [phone].pdf Page 2 of 2 Height (in): 72 Reference Range: 80 - 120 mg / dl Weight (lbs): 185 Body Mass Index (BMI): 25.1 Electronic Signature(s) Signed: 12/15/2022 12:11:34 PM By: Valeria Batman EMT Entered By: Valeria Batman on 12/15/2022 12:11:34

## 2022-12-18 ENCOUNTER — Encounter (HOSPITAL_BASED_OUTPATIENT_CLINIC_OR_DEPARTMENT_OTHER): Payer: No Typology Code available for payment source | Admitting: Internal Medicine

## 2022-12-18 DIAGNOSIS — L97514 Non-pressure chronic ulcer of other 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: Secondary | ICD-10-CM

## 2022-12-18 DIAGNOSIS — M869 Osteomyelitis, unspecified: Secondary | ICD-10-CM | POA: Diagnosis not present

## 2022-12-18 DIAGNOSIS — E1142 Type 2 diabetes mellitus with diabetic polyneuropathy: Secondary | ICD-10-CM

## 2022-12-18 DIAGNOSIS — E11621 Type 2 diabetes mellitus with foot ulcer: Secondary | ICD-10-CM

## 2022-12-18 LAB — GLUCOSE, CAPILLARY
Glucose-Capillary: 223 mg/dL — ABNORMAL HIGH (ref 70–99)
Glucose-Capillary: 233 mg/dL — ABNORMAL HIGH (ref 70–99)

## 2022-12-18 NOTE — Progress Notes (Signed)
[place], Bhavya ([id number]) [id number] Page 1 of 2 Visit Report for 12/18/2022 Arrival Information Details Patient Name: Date of Service: [place], [name] 12/18/2022 12:00 PM Medical Record Number: [id number] Patient Account Number: [id number] Date of Birth/Sex: Treating RN: 11-11-1950 (72 y.o. [name], [name] Primary Care Haylei Cobin: [name] Other Clinician: Donavan Burnet Referring Enola Siebers: Treating Braylen Staller/Extender: Melanee Left Weeks in Treatment: 9 Visit Information History Since Last Visit All ordered tests and consults were completed: Yes Patient Arrived: [place] Added or deleted any medications: No Arrival Time: 11:00 Any new allergies or adverse reactions: No Accompanied By: self Had a fall or experienced change in No Transfer Assistance: None activities of daily living that may affect Patient Identification Verified: Yes risk of falls: Secondary Verification Process Completed: Yes Signs or symptoms of abuse/neglect since last visito No Patient Requires Transmission-Based Precautions: No Hospitalized since last visit: No Patient Has Alerts: No Implantable device outside of the clinic excluding No cellular tissue based products placed in the center since last visit: Pain Present Now: No Electronic Signature(s) Signed: 12/18/2022 1:40:32 PM By: Donavan Burnet CHT EMT BS , , Entered By: Donavan Burnet on 12/18/2022 13:24:40 -------------------------------------------------------------------------------- Encounter Discharge Information Details Patient Name: Date of Service: [facility] [name], [name] 12/18/2022 12:00 PM Medical Record Number: [id number] Patient Account Number: [id number] Date of Birth/Sex: Treating RN: Nov 27, 1950 (72 y.o. [name] Primary Care Deiontae Rabel: [name] Other Clinician: Donavan Burnet Referring Skylynne Schlechter: Treating Danelia Snodgrass/Extender: Melanee Left Weeks in Treatment:  9 Encounter Discharge Information Items Discharge Condition: Stable Ambulatory Status: Wheelchair Discharge Destination: Home Transportation: Private Auto Accompanied By: self Schedule Follow-up Appointment: No Clinical Summary of Care: Electronic Signature(s) Signed: 12/18/2022 1:40:32 PM By: Donavan Burnet CHT EMT BS , , Entered By: Donavan Burnet on 12/18/2022 13:33:14 -------------------------------------------------------------------------------- Vitals Details Patient Name: Date of Service: [facility] [name], [name] 12/18/2022 12:00 PM Medical Record Number: [id number] Patient Account Number: [id number] Date of Birth/Sex: Treating RN: June 03, 1951 (72 y.o. [name] Primary Care Naseem Varden: [name] Other Clinician: Donavan Burnet Referring Abygale Karpf: Treating Lalo Tromp/Extender: Melanee Left Weeks in Treatment: 9 Vital Signs Time Taken: 11:23 Temperature (F): 98.2 [place], [place] ([id number]) [phone].pdf Page 2 of 2 Height (in): 72 Pulse (bpm): 101 Weight (lbs): 185 Respiratory Rate (breaths/min): 18 Body Mass Index (BMI): 25.1 Blood Pressure (mmHg): 172/72 Capillary Blood Glucose (mg/dl): 223 Reference Range: 80 - 120 mg / dl Electronic Signature(s) Signed: 12/18/2022 1:40:32 PM By: Donavan Burnet CHT EMT BS , , Entered By: Donavan Burnet on 12/18/2022 13:25:05

## 2022-12-19 ENCOUNTER — Encounter (HOSPITAL_BASED_OUTPATIENT_CLINIC_OR_DEPARTMENT_OTHER): Payer: No Typology Code available for payment source | Admitting: Internal Medicine

## 2022-12-19 DIAGNOSIS — E11621 Type 2 diabetes mellitus with foot ulcer: Secondary | ICD-10-CM | POA: Diagnosis not present

## 2022-12-19 DIAGNOSIS — M869 Osteomyelitis, unspecified: Secondary | ICD-10-CM

## 2022-12-19 DIAGNOSIS — L97514 Non-pressure chronic ulcer of other 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: Secondary | ICD-10-CM | POA: Diagnosis not present

## 2022-12-19 DIAGNOSIS — E1142 Type 2 diabetes mellitus with diabetic polyneuropathy: Secondary | ICD-10-CM | POA: Diagnosis not present

## 2022-12-19 LAB — GLUCOSE, CAPILLARY
Glucose-Capillary: 118 mg/dL — ABNORMAL HIGH (ref 70–99)
Glucose-Capillary: 112 mg/dL — ABNORMAL HIGH (ref 70–99)
Glucose-Capillary: 178 mg/dL — ABNORMAL HIGH (ref 70–99)
Glucose-Capillary: 217 mg/dL — ABNORMAL HIGH (ref 70–99)

## 2022-12-19 NOTE — Progress Notes (Signed)
Viola, Khai ([id number]) [id number] Page 1 of 1 Visit Report for 12/18/2022 SuperBill Details Patient Name: Date of Service: [name], [name] [name] 12/18/2022 Medical Record Number: [id number] Patient Account Number: [id number] Date of Birth/Sex: Treating RN: May 12, 1951 (72 y.o. 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.621 Type 2 diabetes mellitus with foot ulcer N18.5 Chronic kidney disease, stage 5 M86.9 Osteomyelitis, unspecified E11.42 Type 2 diabetes mellitus with diabetic polyneuropathy I10 Essential (primary) hypertension Facility Procedures CPT4 Code Description Modifier Quantity [id number] G0277-(Facility Use Only) HBOT full body chamber, 36min , 4 ICD-10 Diagnosis Description M86.9 Osteomyelitis, unspecified 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.621 Type 2 diabetes mellitus with foot ulcer Physician Procedures Quantity CPT4 Code Description Modifier [id number] - WC PHYS HYPERBARIC OXYGEN THERAPY 1 ICD-10 Diagnosis Description M86.9 Osteomyelitis, unspecified 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

## 2022-12-19 NOTE — Progress Notes (Signed)
[place], Cree ([id number]) [id number] Page 1 of 2 Visit Report for 12/18/2022 HBO Details Patient Name: Date of Service: [facility] [name], [name] 12/18/2022 12:00 PM Medical Record Number: [id number] Patient Account Number: [id number] Date of Birth/Sex: Treating RN: 1950/12/01 (72 y.o. 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 HBO Treatment Details Treatment Number: 31 Patient Type: Outpatient Chamber Type: Monoplace Chamber Serial #: [id number] Treatment Protocol: 2.0 ATA with 90 minutes oxygen, and no air breaks Treatment Details Compression Rate Down: 1.5 psi / minute De-Compression Rate Up: 1.5 psi / minute Air breaks and breathing Decompress Decompress Compress Tx Pressure Begins Reached periods Begins Ends (leave unused spaces blank) Chamber Pressure (ATA 1 2 ------2 1 ) Clock Time (24 hr) 11:25 11:34 - - - - - - 13:04 13:13 Treatment Length: 108 (minutes) Treatment Segments: 4 Vital Signs Capillary Blood Glucose Reference Range: 80 - 120 mg / dl HBO Diabetic Blood Glucose Intervention Range: <131 mg/dl or >249 mg/dl Type: Time Vitals Blood Pulse: Respiratory Temperature: Capillary Blood Glucose Pulse Action Taken: Pressure: Rate: Glucose (mg/dl): Meter #: Oximetry (%) Taken: Pre 11:23 172/72 101 18 98.2 223 1 patient asymptomatic for tachycardia. Post 13:16 150/72 89 18 98.2 233 1 none per protocol Treatment Response Treatment Toleration: Well Treatment Completion Status: Treatment Completed without Adverse Event Treatment Notes Patient arrived, prepared for treatment. Vital  signs within normal range. After performing safety check, patient was placed in the chamber which was compressed with 100% oxygen at a rate of 2 psi/min after confirming normal ear equalization. Mr. Kekahuna tolerated treatment and subsequent decompression of the chamber at the rate of 2 psi/min. Patient denied any problems with ear equalization and/or pain. He was stable upon discharge. Physician HBO Attestation: I certify that I supervised this HBO treatment in accordance with Medicare guidelines. A trained emergency response team is readily available per Yes hospital policies and procedures. Continue HBOT as ordered. Yes Electronic Signature(s) Signed: 12/18/2022 4:26:30 PM By: Kalman Shan DO Previous Signature: 12/18/2022 1:40:32 PM Version By: Donavan Burnet CHT EMT BS , , Entered By: Kalman Shan on 12/18/2022 16:25:32 HBO Safety Checklist Details -------------------------------------------------------------------------------- [name] ([id number] Page 2 of 2 Patient Name: Date of ServiceJodene Nam, [name] 12/18/2022 12:00 PM Medical Record Number: [id number] Patient Account Number: [id number] Date of Birth/Sex: Treating RN: 11-01-1950 (72 y.o. [name], [name] Primary Care Addelyn Alleman: [name] Other Clinician: Donavan Burnet Referring Nakkia Mackiewicz: Treating Durene Dodge/Extender: Melanee Left Weeks in Treatment: 9 HBO Safety Checklist Items Safety Checklist Consent Form Signed Patient voided / foley secured and emptied When did you last eato 1030 Last dose of injectable or oral agent Yesterday Ostomy pouch emptied and vented if applicable NA All implantable devices assessed, documented and approved NA Intravenous access site secured and place NA Valuables secured Linens and cotton and cotton/polyester blend (less than 51% polyester) Personal oil-based products / skin lotions / body lotions removed Wigs or  hairpieces removed NA Smoking or tobacco materials removed NA Books / newspapers / magazines / loose paper removed Cologne, aftershave, perfume and deodorant removed Jewelry removed (may wrap wedding band) Make-up removed NA Hair care products removed Battery operated devices (external) removed Heating patches and chemical warmers removed Titanium eyewear  removed Nail polish cured greater than 10 hours NA Casting material cured greater than 10 hours NA Hearing aids removed NA Loose dentures or partials removed dentures removed Prosthetics have been removed NA Patient demonstrates correct use of air break device (if applicable) Patient concerns have been addressed Patient grounding bracelet on and cord attached to chamber Specifics for Inpatients (complete in addition to above) Medication sheet sent with patient NA Intravenous medications needed or due during therapy sent with patient NA Drainage tubes (e.g. nasogastric tube or chest tube secured and vented) NA Endotracheal or Tracheotomy tube secured NA Cuff deflated of air and inflated with saline NA Airway suctioned NA Notes Paper version used prior to treatment start. Electronic Signature(s) Signed: 12/18/2022 1:40:32 PM By: Donavan Burnet CHT EMT BS , , Entered By: Donavan Burnet on 12/18/2022 13:26:19

## 2022-12-20 ENCOUNTER — Encounter (HOSPITAL_BASED_OUTPATIENT_CLINIC_OR_DEPARTMENT_OTHER): Payer: No Typology Code available for payment source | Admitting: General Surgery

## 2022-12-20 DIAGNOSIS — L97512 Non-pressure chronic ulcer of other part of right foot with fat layer exposed: Secondary | ICD-10-CM | POA: Diagnosis not present

## 2022-12-20 DIAGNOSIS — M86671 Other chronic osteomyelitis, right ankle and foot: Secondary | ICD-10-CM | POA: Diagnosis not present

## 2022-12-20 DIAGNOSIS — E11621 Type 2 diabetes mellitus with foot ulcer: Secondary | ICD-10-CM | POA: Diagnosis not present

## 2022-12-20 LAB — GLUCOSE, CAPILLARY
Glucose-Capillary: 126 mg/dL — ABNORMAL HIGH (ref 70–99)
Glucose-Capillary: 136 mg/dL — ABNORMAL HIGH (ref 70–99)
Glucose-Capillary: 167 mg/dL — ABNORMAL HIGH (ref 70–99)

## 2022-12-21 ENCOUNTER — Encounter (HOSPITAL_BASED_OUTPATIENT_CLINIC_OR_DEPARTMENT_OTHER): Payer: No Typology Code available for payment source | Admitting: General Surgery

## 2022-12-21 DIAGNOSIS — N185 Chronic kidney disease, stage 5: Secondary | ICD-10-CM | POA: Diagnosis not present

## 2022-12-21 DIAGNOSIS — L97512 Non-pressure chronic ulcer of other part of right foot with fat layer exposed: Secondary | ICD-10-CM | POA: Diagnosis not present

## 2022-12-21 DIAGNOSIS — I10 Essential (primary) hypertension: Secondary | ICD-10-CM | POA: Diagnosis not present

## 2022-12-21 DIAGNOSIS — E11621 Type 2 diabetes mellitus with foot ulcer: Secondary | ICD-10-CM | POA: Diagnosis not present

## 2022-12-21 DIAGNOSIS — E1142 Type 2 diabetes mellitus with diabetic polyneuropathy: Secondary | ICD-10-CM | POA: Diagnosis not present

## 2022-12-21 DIAGNOSIS — M869 Osteomyelitis, unspecified: Secondary | ICD-10-CM | POA: Diagnosis not present

## 2022-12-21 NOTE — Progress Notes (Signed)
JONN, MILIA ([id number]) [id number] Page 1 of 2 Visit Report for 12/19/2022 Arrival Information Details Patient Name: Date of Service: [place], [name] 12/19/2022 12:00 PM Medical Record Number: [id number] Patient Account Number: [id number] Date of Birth/Sex: Treating RN: 02-26-1951 (72 y.o. [name] Primary Care Correen Bubolz: [name] Other Clinician: Donavan Burnet Referring Nkenge Sonntag: Treating Jonnathan Birman/Extender: Melanee Left Weeks in Treatment: 9 Visit Information History Since Last Visit All ordered tests and consults were completed: Yes Patient Arrived: Cane Added or deleted any medications: No Arrival Time: 11:04 Any new allergies or adverse reactions: No Accompanied By: self Had a fall or experienced change in No Transfer Assistance: None activities of daily living that may affect Patient Identification Verified: Yes risk of falls: Secondary Verification Process Completed: Yes Signs or symptoms of abuse/neglect since last visito No Patient Requires Transmission-Based Precautions: No Hospitalized since last visit: No Patient Has Alerts: No Implantable device outside of the clinic excluding No cellular tissue based products placed in the center since last visit: Pain Present Now: No Electronic Signature(s) Signed: 12/20/2022 3:53:40 PM By: Donavan Burnet CHT EMT BS , , Entered By: Donavan Burnet on 12/19/2022 14:59:10 -------------------------------------------------------------------------------- Encounter Discharge Information Details Patient Name: Date of Service: [facility], [name] 12/19/2022 12:00 PM Medical Record Number: [id number] Patient Account Number: [id number] Date of Birth/Sex: Treating RN: 1951/05/04 (72 y.o. [name] Primary Care Makinzie Considine: [name] Other Clinician: Donavan Burnet Referring Eissa Buchberger: Treating Whitaker Holderman/Extender: Melanee Left Weeks in Treatment:  9 Encounter Discharge Information Items Discharge Condition: Stable Ambulatory Status: Cane Discharge Destination: Home Transportation: Private Auto Accompanied By: self Schedule Follow-up Appointment: No Clinical Summary of Care: Electronic Signature(s) Signed: 12/20/2022 3:53:40 PM By: Donavan Burnet CHT EMT BS , , Entered By: Donavan Burnet on 12/19/2022 15:10:12 -------------------------------------------------------------------------------- [place] Details Patient Name: Date of Service: [facility] [name], [name] 12/19/2022 12:00 PM Medical Record Number: [id number] Patient Account Number: [id number] Date of Birth/Sex: Treating RN: [birth year] (72 y.o. [name] Primary Care Jania Steinke: [name] Other Clinician: Valeria Batman Referring Berlinda Farve: Treating Lequisha Cammack/Extender: Melanee Left Weeks in Treatment: 9 Vital Signs Time Taken: 11:10 Temperature (F): 98.1 [place], Jaevin ([id number]) [id number] Page 2 of 2 Height (in): 72 Pulse (bpm): 103 Weight (lbs): 185 Respiratory Rate (breaths/min): 18 Body Mass Index (BMI): 25.1 Blood Pressure (mmHg): 165/73 Capillary Blood Glucose (mg/dl): 118 Reference Range: 80 - 120 mg / dl Electronic Signature(s) Signed: 12/20/2022 3:53:40 PM By: Donavan Burnet CHT EMT BS , , Entered By: Donavan Burnet on 12/19/2022 15:01:17

## 2022-12-21 NOTE — Progress Notes (Signed)
Cunningham, Dilraj ([id number]) [id number] Page 1 of 2 Visit Report for 12/19/2022 HBO Details Patient Name: Date of Service: [facility] [name], [name] 12/19/2022 12:00 PM Medical Record Number: [id number] Patient Account Number: [id number] Date of Birth/Sex: Treating RN: 04-Aug-1951 (72 y.o. 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 HBO Treatment Details Treatment Number: 32 Patient Type: Outpatient Chamber Type: Monoplace Chamber Serial #: [id number] Treatment Protocol: 2.0 ATA with 90 minutes oxygen, and no air breaks Treatment Details Compression Rate Down: 1.5 psi / minute De-Compression Rate Up: 2.0 psi / minute Air breaks and breathing Decompress Decompress Compress Tx Pressure Begins Reached periods Begins Ends (leave unused spaces blank) Chamber Pressure (ATA 1 2 ------2 1 ) Clock Time (24 hr) 10:36 12:47 - - - - - - 14:17 14:25 Treatment Length: 229 (minutes) Treatment Segments: 8 Vital Signs Capillary Blood Glucose Reference Range: 80 - 120 mg / dl HBO Diabetic Blood Glucose Intervention Range: <131 mg/dl or >249 mg/dl Type: Time Vitals Blood Pulse: Respiratory Temperature: Capillary Blood Glucose Pulse Action Taken: Pressure: Rate: Glucose (mg/dl): Meter #: Oximetry (%) Taken: Pre 11:10 165/73 103 18 98.1 118 1 8 oz Glucerna Shake given Post 14:28 140/66 89 18 98.1 217 1 none per protocol Pre 11:36 112 1 Snack given per Physician Verbal Order Pre 12:34 178 1 Cleared for HBOT Treatment Response Treatment Toleration: Well Treatment Completion Status: Treatment Completed without  Adverse Event Treatment Notes Patient arrived, vital signs taken. Blood glucose was 118 mg/dL at 1110. Mr. Glassburn was given an 8 oz Glucerna to drink. Per protocol after 30 minutes blood glucose was re-measured with result of 112 mg/dL. Dr. Heber Rockledge was alerted to this and patient was directed by verbal order to eat peanut butter, graham crackers, orange juice, and another 8 oz Glucerna with a blood glucose check 15-20 minutes later. Patient consumed the snack approximately 1215. Patient's blood glucose was 178 mg/dL at 1234. After performing safety check, patient was placed in the chamber which was compressed with 100% oxygen at a rate of 2 psi/min after confirming ear equalization. Mr. Pegg tolerated the treatment and the subsequent decompression of the chamber at 2 psi/min. Post treatment vital signs were within normal range. Patient was stable upon discharge. Physician HBO Attestation: I certify that I supervised this HBO treatment in accordance with Medicare guidelines. A trained emergency response team is readily available per Yes hospital policies and procedures. Continue HBOT as ordered. Yes Electronic Signature(s) Signed: 12/19/2022 5:03:25 PM By: Kalman Shan DO Entered By: Kalman Shan on 12/19/2022 17:01:49 Nocito, Sang ([id number] Page 2 of 2 -------------------------------------------------------------------------------- HBO Safety Checklist Details Patient Name: Date of Service: [facility] [name], [name] 12/19/2022 12:00 PM Medical Record Number: [id number] Patient Account Number: [id number] Date of Birth/Sex: Treating RN: 1951-03-16 (72 y.o. [name] Primary Care Yaser Harvill: [name] Other Clinician: Valeria Batman Referring Anael Rosch: Treating Amberrose Friebel/Extender: Melanee Left Weeks in Treatment: 9 HBO Safety Checklist Items Safety Checklist Consent Form Signed Patient voided / foley secured and emptied When  did you last eato 1030 Last dose of injectable or oral agent Last PM Ostomy pouch emptied and vented if applicable NA All implantable devices assessed, documented and approved NA Intravenous access site secured and  place NA Valuables secured Linens and cotton and cotton/polyester blend (less than 51% polyester) Personal oil-based products / skin lotions / body lotions removed Wigs or hairpieces removed NA Smoking or tobacco materials removed NA Books / newspapers / magazines / loose paper removed Cologne, aftershave, perfume and deodorant removed Jewelry removed (may wrap wedding band) Make-up removed Hair care products removed Battery operated devices (external) removed Heating patches and chemical warmers removed Titanium eyewear removed Nail polish cured greater than 10 hours NA Casting material cured greater than 10 hours NA Hearing aids removed NA Loose dentures or partials removed NA Prosthetics have been removed NA Patient demonstrates correct use of air break device (if applicable) Patient concerns have been addressed Patient grounding bracelet on and cord attached to chamber Specifics for Inpatients (complete in addition to above) Medication sheet sent with patient NA Intravenous medications needed or due during therapy sent with patient NA Drainage tubes (e.g. nasogastric tube or chest tube secured and vented) NA Endotracheal or Tracheotomy tube secured NA Cuff deflated of air and inflated with saline NA Airway suctioned NA Notes Paper version used prior to treatment start. Electronic Signature(s) Signed: 12/20/2022 3:53:40 PM By: Donavan Burnet CHT EMT BS , , Entered By: Donavan Burnet on 12/19/2022 15:01:08

## 2022-12-21 NOTE — Progress Notes (Signed)
BOOKERT, REDEKER ([id number]) [id number] Page 1 of 1 Visit Report for 12/19/2022 SuperBill Details Patient Name: Date of Service: [name], [name] [name] 12/19/2022 Medical Record Number: [id number] Patient Account Number: [id number] Date of Birth/Sex: Treating RN: [birth year] (72 y.o. 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, stage 5 M86.9 Osteomyelitis, unspecified E11.42 Type 2 diabetes mellitus with diabetic polyneuropathy I10 Essential (primary) hypertension Facility Procedures CPT4 Code Description Modifier Quantity [id number]-(Facility Use Only) HBOT full body chamber, 42min , 8 ICD-10 Diagnosis Description M86.9 Osteomyelitis, unspecified 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

## 2022-12-21 NOTE — Progress Notes (Signed)
[place], Xzaviar ([id number]) [id number] Page 1 of 2 Visit Report for 12/20/2022 Arrival Information Details Patient Name: Date of Service: [name], [name] [name] 12/20/2022 12:00 PM Medical Record Number: [id number] Patient Account Number: [id number] Date of Birth/Sex: Treating RN: [birth year] (72 y.o. [name] Primary Care [name]: [name] Other Clinician: Valeria [name] Referring [name]: Treating [name] Weeks in Treatment: 9 Visit Information History Since Last Visit All ordered tests and consults were completed: Yes Patient Arrived: [name] Added or deleted any medications: No Arrival Time: 10:14 Any new allergies or adverse reactions: No Accompanied By: Wife Had a fall or experienced change in No Transfer Assistance: None activities of daily living that may affect Patient Identification Verified: Yes risk of falls: Secondary Verification Process Completed: Yes Signs or symptoms of abuse/neglect since last visito No Patient Requires Transmission-Based Precautions: No Hospitalized since last visit: No Patient Has Alerts: No Pain Present Now: No Notes The patient had a [facility] visit today before coming to [facility]. Electronic Signature(s) Signed: 12/20/2022 11:55:29 AM By: [name] EMT Entered By: [name] on 12/20/2022 11:55:29 -------------------------------------------------------------------------------- Encounter Discharge Information Details Patient Name: Date of Service: [name], [name] [name] 12/20/2022 12:00 PM Medical Record Number: [id number] Patient Account Number: [id number] Date of Birth/Sex: Treating RN: January 11, 1951 (72 y.o. [name] Primary Care [name]: [name] Other Clinician: Valeria [name] Referring [name]: Treating [name]/Extender: [name] Weeks in Treatment: 9 Encounter Discharge Information Items Discharge Condition:  Stable Ambulatory Status: Cane Discharge Destination: Home Transportation: Private Auto Accompanied By: None Schedule Follow-up Appointment: Yes Clinical Summary of Care: Electronic Signature(s) Signed: 12/20/2022 3:19:10 PM By: [name] EMT Entered By: [name] on 12/20/2022 15:19:10 -------------------------------------------------------------------------------- [place] Details Patient Name: Date of Service: [name], [name] [name] 12/20/2022 12:00 PM Medical Record Number: [id number] Patient Account Number: [id number] Date of Birth/Sex: Treating RN: 26-Oct-1950 (72 y.o. [name] Primary Care [name]: [name] Other Clinician: Valeria [name] Referring [name]: Treating Kingsly Kloepfer/Extender: [name] Weeks in Treatment: 9 Vital Signs Time Taken: 10:27 Temperature (F): 97.8 [name] ([id number]) [phone].pdf Page 2 of 2 Height (in): 72 Pulse (bpm): 103 Weight (lbs): 185 Respiratory Rate (breaths/min): 16 Body Mass Index (BMI): 25.1 Blood Pressure (mmHg): 193/91 Reference Range: 80 - 120 mg / dl Electronic Signature(s) Signed: 12/20/2022 11:55:54 AM By: [name] EMT Entered By: [name] on 12/20/2022 11:55:54

## 2022-12-21 NOTE — Progress Notes (Addendum)
[name], [name] ([id number]) [id number] Page 1 of 2 Visit Report for 12/20/2022 HBO Details Patient Name: Date of Service: [name], [name] 12/20/2022 12:00 PM Medical Record Number: [id number] Patient Account Number: [id number] Date of Birth/Sex: Treating RN: [birth year] (72 y.o. 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 HBO Treatment Details Treatment Number: 33 Patient Type: Outpatient Chamber Type: Monoplace Chamber Serial #: [id number] Treatment Protocol: 2.5 ATA with 90 minutes oxygen, with two 5 minute air breaks Treatment Details Compression Rate Down: 2.0 psi / minute De-Compression Rate Up: A breaks and breathing ir Compress Tx Pressure periods Decompress Decompress Begins Reached (leave unused spaces Begins Ends blank) Chamber Pressure (ATA 1 2.5 2.5 2.5 2.5 2.5 - - 2.5 1 ) Clock Time (24 hr) 11:19 11:34 12:04 12:09 12:39 12:44 - - 13:14 13:28 Treatment Length: 129 (minutes) Treatment Segments: 4 Vital Signs Capillary Blood Glucose Reference Range: 80 - 120 mg / dl HBO Diabetic Blood Glucose Intervention Range: <131 mg/dl or >249 mg/dl Type: Time Vitals Blood Pulse: Respiratory Capillary Blood Glucose Pulse Action Temperature: Taken: Pressure: Rate: Glucose (mg/dl): Meter #: Oximetry (%) Taken: Pre 10:27 193/91 103 16 97.8 Pre 10:35 126 Patient given 8 oz Glucerna Pre 11:09 136 Post 13:33 158/74 96 18 97.9 Treatment Response Treatment Toleration: Well Treatment Completion Status: Treatment Completed without Adverse Event Treatment Notes At [id number] blood sugar was 126.  The patient was given 8ox of Glucerna to drink. At 1109 Blood sugar recheck was 136. New HBO orders from Dr. Celine Ahr to change from 2.0ATA for 90 min with no air breaks. T Remove Libre2 and change Rx to 2.5 ATA for 90 min with 2- 72min o air breaks. 1315 Noted at the time of decompression the patient started twitching on the right side of face. The patient was placed on air break mask Dr. Celine Ahr called to chamber side. The patient stated that he had "Hiccups". The patient was able to respond to questions. Air break mask removed at 12 PSI. No other problems noted. The patient was removed from V/S checked with blood sugar. The patient stated that he felt that his sugar was starting to drop. He was given orange juice. The patient waited 15 min and was asked how he felt. He stated that he felt fine and was going to get something to eat. Physician HBO Attestation: I certify that I supervised this HBO treatment in accordance with Medicare guidelines. A trained emergency response team is readily available per Yes hospital policies and procedures. Continue HBOT as ordered. Yes Electronic Signature(s) Signed: 12/20/2022 3:53:43 PM By: Fredirick Maudlin MD FACS Previous Signature: 12/20/2022 3:17:44 PM Version By: Valeria Batman EMT Previous Signature: 12/20/2022 12:23:17 PM Version By: Valeria Batman EMT Entered By: Fredirick Maudlin on 12/20/2022 15:53:43 Linz, Elber ([id number] Page 2 of 2 -------------------------------------------------------------------------------- HBO Safety Checklist Details Patient Name: Date of ServiceJodene [name], [name] 12/20/2022 12:00 PM Medical Record Number: [id number] Patient Account Number: [id number] Date of Birth/Sex: Treating RN: 07/05/1951 (72 y.o. [name] Primary Care Armour Villanueva: [name] Other Clinician: Valeria Batman Referring Phillipa Morden: Treating Kemonie Cutillo/Extender: [name] Weeks in  Treatment: 9 HBO Safety Checklist Items Safety Checklist Consent Form Signed Patient voided / foley secured and  emptied When did you last eato last night Last dose of injectable or oral agent last night Ostomy pouch emptied and vented if applicable NA All implantable devices assessed, documented and approved Dialysis shunt Intravenous access site secured and place NA Valuables secured Linens and cotton and cotton/polyester blend (less than 51% polyester) Personal oil-based products / skin lotions / body lotions removed Wigs or hairpieces removed NA Smoking or tobacco materials removed Books / newspapers / magazines / loose paper removed Cologne, aftershave, perfume and deodorant removed Jewelry removed (may wrap wedding band) Make-up removed NA Hair care products removed Battery operated devices (external) removed Heating patches and chemical warmers removed Titanium eyewear removed NA Nail polish cured greater than 10 hours NA Casting material cured greater than 10 hours NA Hearing aids removed NA Loose dentures or partials removed removed by patient Prosthetics have been removed NA Patient demonstrates correct use of air break device (if applicable) Patient concerns have been addressed Patient grounding bracelet on and cord attached to chamber Specifics for Inpatients (complete in addition to above) Medication sheet sent with patient NA Intravenous medications needed or due during therapy sent with patient NA Drainage tubes (e.g. nasogastric tube or chest tube secured and vented) NA Endotracheal or Tracheotomy tube secured NA Cuff deflated of air and inflated with saline NA Airway suctioned NA Notes The safety checklist was done before the treatment was started. Electronic Signature(s) Signed: 12/20/2022 11:57:23 AM By: Valeria Batman EMT Entered By: Valeria Batman on 12/20/2022 11:57:22

## 2022-12-21 NOTE — Progress Notes (Signed)
[place], [place] ([id number]) [id number] Page 1 of 2 Visit Report for 12/20/2022 Problem List Details Patient Name: Date of Service: [name], [name] 12/20/2022 12:00 PM Medical Record Number: [id number] Patient Account Number: [id number] Date of Birth/Sex: Treating RN: 06-28-1951 (72 y.o. 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, stage 5 10/12/2022 No Yes M86.9 Osteomyelitis, unspecified 10/12/2022 No Yes E11.42 Type 2 diabetes mellitus with diabetic polyneuropathy 10/12/2022 No Yes I10 Essential (primary) hypertension 10/12/2022 No Yes Inactive Problems Resolved Problems Electronic Signature(s) Signed: 12/20/2022 3:18:24 PM By: Valeria Batman EMT Signed: 12/20/2022 3:48:41 PM By: Fredirick Maudlin MD FACS Entered By: Valeria Batman on 12/20/2022 15:18:24 -------------------------------------------------------------------------------- SuperBill Details Patient Name: Date of Service: [name], [name] 12/20/2022 Medical Record Number: [id number] Patient Account Number: [id number] Date of Birth/Sex: Treating RN: 04/27/1951 (72 y.o. 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.621 Type 2 diabetes mellitus with foot ulcer [name], [name] ([id number])  [id number].pdf Page 2 of 2 N18.5 Chronic kidney disease, stage 5 M86.9 Osteomyelitis, unspecified E11.42 Type 2 diabetes mellitus with diabetic polyneuropathy I10 Essential (primary) hypertension Facility Procedures : CPT4 Code Description: [id number]-(Facility Use Only) HBOT full body chamber, 44min , ICD-10 Diagnosis Description M86.9 Osteomyelitis, unspecified L97.514 Non-pressure chronic ulcer of other part of right foo E11.42 Type 2 diabetes mellitus with  diabetic polyneuropathy E11.621 Type 2 diabetes mellitus with foot ulcer Modifier: t with necrosis Quantity: 4 of bone Physician Procedures : CPT4 Code Description Modifier [id number] - WC PHYS HYPERBARIC OXYGEN THERAPY ICD-10 Diagnosis Description M86.9 Osteomyelitis, unspecified 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

## 2022-12-22 ENCOUNTER — Encounter (HOSPITAL_BASED_OUTPATIENT_CLINIC_OR_DEPARTMENT_OTHER): Payer: No Typology Code available for payment source | Admitting: General Surgery

## 2022-12-22 DIAGNOSIS — E11621 Type 2 diabetes mellitus with foot ulcer: Secondary | ICD-10-CM | POA: Diagnosis not present

## 2022-12-22 DIAGNOSIS — M86671 Other chronic osteomyelitis, right ankle and foot: Secondary | ICD-10-CM | POA: Diagnosis not present

## 2022-12-22 LAB — GLUCOSE, CAPILLARY
Glucose-Capillary: 203 mg/dL — ABNORMAL HIGH (ref 70–99)
Glucose-Capillary: 178 mg/dL — ABNORMAL HIGH (ref 70–99)

## 2022-12-22 NOTE — Progress Notes (Signed)
[place], Akul ([id number]) [id number] Page 1 of 10 Visit Report for 12/20/2022 Chief Complaint Document Details Patient Name: Date of Service: [name], [name] 12/20/2022 10:00 [place] Record Number: [id number] Patient Account Number: [id number] Date of Birth/Sex: Treating RN: 1951/08/29 (72 y.o. M) Primary Care Provider: [name] Other Clinician: Referring Provider: Treating Provider/Extender: [name] Weeks in Treatment: 9 Information Obtained from: Patient Chief Complaint Patients presents for treatment of an open diabetic ulcer on the right great toe Electronic Signature(s) Signed: 12/20/2022 11:05:50 AM By: Fredirick Maudlin MD FACS Entered By: Fredirick Maudlin on 12/20/2022 11:05:49 -------------------------------------------------------------------------------- Debridement Details Patient Name: Date of Service: [facility] [name], [name] 12/20/2022 10:00 [place] Record Number: [id number] Patient Account Number: [id number] Date of Birth/Sex: Treating RN: 15-Jul-1951 (72 y.o. [name] Primary Care Provider: [name] Other Clinician: Referring Provider: Treating Provider/Extender: [name] Weeks in Treatment: 9 Debridement Performed for Assessment: Wound #1 Right,Medial T Great oe Performed By: Physician Fredirick Maudlin, MD Debridement Type: Debridement Severity of Tissue Pre Debridement: Bone involvement without necrosis Level of Consciousness (Pre-procedure): Awake and Alert Pre-procedure Verification/Time Out Yes - 10:35 Taken: Start Time: 10:35 Pain Control: Lidocaine 4% T opical Solution T Area Debrided (L x W): otal 2 (cm) x 3.1 (cm) = 6.2 (cm) Tissue and other material debrided: Non-Viable, Slough, Subcutaneous, Tendon, Slough Level: Skin/Subcutaneous Tissue/Muscle Debridement Description: Excisional Instrument: Curette, Forceps, Scissors Bleeding: Minimum Hemostasis Achieved:  Pressure Response to Treatment: Procedure was tolerated well Level of Consciousness (Post- Awake and Alert procedure): Post Debridement Measurements of Total Wound Length: (cm) 2 Width: (cm) 3.1 Depth: (cm) 0.3 Volume: (cm) 1.461 Character of Wound/Ulcer Post Debridement: Improved Severity of Tissue Post Debridement: Fat layer exposed Post Procedure Diagnosis Same as Pre-procedure Notes scribed for Dr. Celine Ahr by Adline Peals, RN Electronic Signature(s) Signed: 12/20/2022 3:48:41 PM By: Fredirick Maudlin MD FACS Signed: 12/21/2022 4:19:16 PM By: Blanche East RN Entered By: Blanche East on 12/20/2022 10:42:31 [place], Luciano ([id number]) [id number] Page 2 of 10 -------------------------------------------------------------------------------- HPI Details Patient Name: Date of ServiceJodene [name], [name] 12/20/2022 10:00 [place] Record Number: [id number] Patient Account Number: [id number] Date of Birth/Sex: Treating RN: 06-May-1951 (72 y.o. M) Primary Care Provider: [name] Other Clinician: Referring Provider: Treating Provider/Extender: [name] Weeks in Treatment: 9 History of Present Illness HPI Description: ADMISSION 10/12/2022 This is a 72 year old type II diabetic (last hemoglobin A1c 7.7 in October 2023) with stage V chronic kidney disease and hypertension. He has been followed by podiatry for an ulcer on his right great toe. He has a history of prior toe and foot infections requiring amputation. In November 2023, he had an outpatient MRI that demonstrated osteomyelitis. He was admitted to the hospital for IV antibiotics and subsequently underwent irrigation and debridement of the toe with biopsy and placement of married 3 layer matrix. Cultures grew out MRSA and osteomyelitis was seen on pathology. He was followed by infectious disease and given a course of doxycycline and Augmentin. He completed this course at the end of  December. He has been evaluated by vascular surgery with formal ABIs as well as consultation. He does have adequate blood flow at least to the dorsalis pedis. He last saw podiatry on January 4 of this year where he underwent debridement. It was felt he would benefit from specialized wound care and he was referred to our center for further evaluation and management. On the dorsal aspect of his right great toe, there is a  circular ulcer. There is a fair amount of undermining from callus and dry skin. The surface is a bit pale and fibrotic, but there is no obvious necrosis. 10/19/2022: He has been approved by his insurance to undergo hyperbaric oxygen therapy. EKG has been obtained and is normal. We are still waiting on his chest x-ray. The wound is clean with just a little bit of periwound callus and slough on the surface. Tendon is exposed. 10/26/2022: Unfortunately, the required co-pay from his insurance is cost prohibitive for him and he will not be able to receive hyperbaric oxygen therapy. Some moisture got under the dry skin around his wound and lifted it off but there is good epithelialized tissue beneath this. The exposed tendon has deteriorated. 11/03/2022: After some discussion with the financial office, it has become feasible for the patient to undergo hyperbaric oxygen therapy and he has received his first treatments this week. The wound is a little bit smaller and the tissue has a better color to it, but there is still extensive nonviable subcutaneous tissue and slough present. 11/09/2022: The wound measured a little bit smaller today. There is still slough and nonviable subcu tissue and tendon present. 11/16/2022: The wound continues to contract. There is still substantial depth to it, however, and bone remains exposed at the base. This is also a (early) 30-day update for HBO. 11/27/2022: Unfortunately, the wound has deteriorated. It appears that the tissue has become macerated. There is a second  ulcer on the lateral aspect of his great toe. The nail is lifting off of the bed and there is wet skin lifting off of the underlying tissue. The initial wound is smaller but still has a fair amount of depth to it. The patient does report quite a bit more drainage since his last visit. 12/20/2022: For scheduling reasons, somehow we have missed having a wound care visit in between his hyperbaric oxygen therapy treatments. The 2 openings of the wound have converged into 1 large site. There is necrotic tendon present at the lateral aspect of the great toe. The bone is visible, including the joint space. The area of the initial wound is flush with the surrounding skin. No malodor or purulent drainage. Electronic Signature(s) Signed: 12/20/2022 11:07:19 AM By: Fredirick Maudlin MD FACS Entered By: Fredirick Maudlin on 12/20/2022 11:07:19 -------------------------------------------------------------------------------- Physical Exam Details Patient Name: Date of Service: [facility] [name], [name] 12/20/2022 10:00 A M Medical Record Number: [id number] Patient Account Number: [id number] Date of Birth/Sex: Treating RN: [birth year] (72 y.o. M) Primary Care Provider: [name] Other Clinician: Referring Provider: Treating Provider/Extender: [name] Weeks in Treatment: 9 Constitutional Hypertensive, asymptomatic. Slightly tachycardic. . . no acute distress. Respiratory Normal work of breathing on room air. Notes 12/20/2022: The 2 openings of the wound have converged into 1 large site. There is necrotic tendon present at the lateral aspect of the great toe. The bone is visible, including the joint space. The area of the initial wound is flush with the surrounding skin. No malodor or purulent drainage. Electronic Signature(s) Signed: 12/20/2022 11:08:04 AM By: Fredirick Maudlin MD FACS [place], Signed: 12/20/2022 11:08:04 AM By: Fredirick Maudlin MD FACS [place] ([id number])  [id number] Page 3 of 10 Entered By: Fredirick Maudlin on 12/20/2022 11:08:04 -------------------------------------------------------------------------------- Physician Orders Details Patient Name: Date of Service: [facility] [name], [name] 12/20/2022 10:00 [place] Record Number: [id number] Patient Account Number: [id number] Date of Birth/Sex: Treating RN: 08-02-1951 (72 y.o. [name] Primary Care Provider: [name] Other Clinician: Referring Provider:  Treating Provider/Extender: [name] Weeks in Treatment: 9 Verbal / Phone Orders: No Diagnosis Coding ICD-10 Coding Code Description L97.514 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.621 Type 2 diabetes mellitus with foot ulcer N18.5 Chronic kidney disease, stage 5 M86.9 Osteomyelitis, unspecified E11.42 Type 2 diabetes mellitus with diabetic polyneuropathy I10 Essential (primary) hypertension Follow-up Appointments ppointment in 1 week. - Dr. Celine Ahr Rm 1 Return A Anesthetic Wound #1 Right,Medial T Great oe (In clinic) Topical Lidocaine 4% applied to wound bed Bathing/ Shower/ Hygiene May shower and wash wound with soap and water. Off-Loading Wound #1 Right,Medial T Great oe Other: - Diabetic shoe Hyperbaric Oxygen Therapy Evaluate for HBO Therapy Indication: - chronic refractory osteomyelitis If appropriate for treatment, begin HBOT per protocol: 2.5 ATA for 90 Minutes with 2 Five (5) Minute A Breaks ir Total Number of Treatments: - 40 plus 40 (12/20/22) One treatments per day (delivered Monday through Friday unless otherwise specified in Special Instructions below): Finger stick Blood Glucose Pre- and Post- HBOT Treatment. Follow Hyperbaric Oxygen Glycemia Protocol Give two 4oz orange juices in addition to Glucerna when the glycemic protocol is used. A frin (Oxymetazoline HCL) 0.05% nasal spray - 1 spray in both nostrils daily as  needed prior to HBO treatment for difficulty clearing ears Wound Treatment Wound #1 - T Great oe Wound Laterality: Right, Medial Cleanser: Soap and Water 1 x Per Day/30 Days Discharge Instructions: May shower and wash wound with dial antibacterial soap and water prior to dressing change. Cleanser: Wound Cleanser 1 x Per Day/30 Days Discharge Instructions: Cleanse the wound with wound cleanser prior to applying a clean dressing using gauze sponges, not tissue or cotton balls. Peri-Wound Care: Zinc Oxide Ointment 30g tube 1 x Per Day/30 Days Discharge Instructions: Apply Zinc Oxide to periwound with each dressing change Topical: Gentamicin 1 x Per Day/30 Days Discharge Instructions: As directed by physician Prim Dressing: Hydrofera Blue Ready Transfer Foam, 2.5x2.5 (in/in) (DME) (Generic) 1 x Per Day/30 Days ary Discharge Instructions: Apply directly to wound bed as directed Secondary Dressing: Woven Gauze Sponge, Non-Sterile 4x4 in 1 x Per Day/30 Days Discharge Instructions: Apply over primary dressing as directed. Secured With: Child psychotherapist, Sterile 2x75 (in/in) 1 x Per Day/30 Days Discharge Instructions: Secure with stretch gauze as directed. [place], [place] ([id number]) [id number] Page 4 of 10 Secured With: 94M Medipore Public affairs consultant Surgical T 2x10 (in/yd) 1 x Per Day/30 Days ape Discharge Instructions: Secure with tape as directed. Patient Medications llergies: losartan potassium, sildenafil A Notifications Medication Indication Start End 12/20/2022 lidocaine DOSE topical 4 % cream - cream topical 12/20/2022 gentamicin DOSE topical 0.1 % ointment - Apply to wound with dressing changes GLYCEMIA INTERVENTIONS PROTOCOL PRE-HBO GLYCEMIA INTERVENTIONS ACTION INTERVENTION Obtain pre-HBO capillary blood glucose (ensure 1 physician order is in chart). A. Notify HBO physician and await physician orders. 2 If result is 70 mg/dl or below: B.  If the result meets the hospital definition of a critical result, follow hospital policy. A. Give patient an 8 ounce Glucerna Shake, an 8 ounce Ensure, or 8 ounces of a Glucerna/Ensure equivalent dietary supplement*. B. Wait 30 minutes. If result is 71 mg/dl to 130 mg/dl: C. Retest patients capillary blood glucose (CBG). D. If result greater than or equal to 110 mg/dl, proceed with HBO. If result less than 110 mg/dl, notify HBO physician and consider holding HBO. If result is 131 mg/dl to 249 mg/dl: A. Proceed with HBO. A. Notify HBO  physician and await physician orders. B. It is recommended to hold HBO and do If result is 250 mg/dl or greater: blood/urine ketone testing. C. If the result meets the hospital definition of a critical result, follow hospital policy. POST-HBO GLYCEMIA INTERVENTIONS ACTION INTERVENTION Obtain post HBO capillary blood glucose (ensure 1 physician order is in chart). A. Notify HBO physician and await physician orders. 2 If result is 70 mg/dl or below: B. If the result meets the hospital definition of a critical result, follow hospital policy. A. Give patient an 8 ounce Glucerna Shake, an 8 ounce Ensure, or 8 ounces of a Glucerna/Ensure equivalent dietary supplement*. B. Wait 15 minutes for symptoms of If result is 71 mg/dl to 100 mg/dl: hypoglycemia (i.e. nervousness, anxiety, sweating, chills, clamminess, irritability, confusion, tachycardia or dizziness). C. If patient asymptomatic, discharge patient. If patient symptomatic, repeat capillary blood glucose (CBG) and notify HBO physician. If result is 101 mg/dl to 249 mg/dl: A. Discharge patient. A. Notify HBO physician and await physician orders. B. It is recommended to do blood/urine ketone If result is 250 mg/dl or greater: testing. C. If the result meets the hospital definition of a critical result, follow hospital policy. *Juice or candies are NOT equivalent products. If patient  refuses the Glucerna or Ensure, please consult the hospital dietitian for an appropriate substitute. Electronic Signature(s) Signed: 12/20/2022 3:48:41 PM By: Fredirick Maudlin MD FACS Signed: 12/20/2022 4:37:09 PM By: Adline Peals Previous Signature: 12/20/2022 11:12:32 AM Version By: Fredirick Maudlin MD FACS Entered By: Adline Peals on 12/20/2022 15:07:40 Problem List Details -------------------------------------------------------------------------------- [name] ([id number]) [id number] Page 5 of 10 Patient Name: Date of ServiceJodene [name], [name] 12/20/2022 10:00 [place] Record Number: [id number] Patient Account Number: [id number] Date of Birth/Sex: Treating RN: 06-02-1951 (72 y.o. M) Primary Care Provider: [name] Other Clinician: Referring Provider: Treating Provider/Extender: [name] Weeks in Treatment: 9 Active Problems ICD-10 Encounter Code Description Active Date MDM Diagnosis L97.514 Non-pressure chronic ulcer of other 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 10/12/2022 No Yes E11.621 Type 2 diabetes mellitus with foot ulcer 10/12/2022 No Yes N18.5 Chronic kidney disease, stage 5 10/12/2022 No Yes M86.9 Osteomyelitis, unspecified 10/12/2022 No Yes E11.42 Type 2 diabetes mellitus with diabetic polyneuropathy 10/12/2022 No Yes I10 Essential (primary) hypertension 10/12/2022 No Yes Inactive Problems Resolved Problems Electronic Signature(s) Signed: 12/20/2022 11:05:27 AM By: Fredirick Maudlin MD FACS Entered By: Fredirick Maudlin on 12/20/2022 11:05:27 -------------------------------------------------------------------------------- Progress Note Details Patient Name: Date of Service: [facility] [name], [name] 12/20/2022 10:00 [name] Record Number: [id number] Patient Account Number: [id number] Date of Birth/Sex: Treating RN: [birth year] (72 y.o. M) Primary Care Provider: [name] Other Clinician: Referring  Provider: Treating Provider/Extender: [name] Weeks in Treatment: 9 Subjective Chief Complaint Information obtained from Patient Patients presents for treatment of an open diabetic ulcer on the right great toe History of Present Illness (HPI) ADMISSION 10/12/2022 This is a 72 year old type II diabetic (last hemoglobin A1c 7.7 in October 2023) with stage V chronic kidney disease and hypertension. He has been followed by podiatry for an ulcer on his right great toe. He has a history of prior toe and foot infections requiring amputation. In November 2023, he had an outpatient MRI that demonstrated osteomyelitis. He was admitted to the hospital for IV antibiotics and subsequently underwent irrigation and debridement of the toe with biopsy and placement of married 3 layer matrix. Cultures grew out MRSA and osteomyelitis was seen on pathology. He was followed by  infectious disease and given a course of doxycycline and Augmentin. He completed this course at the end of December. He has been evaluated by vascular surgery with formal ABIs as well as consultation. He does have adequate blood flow at least to the dorsalis pedis. He last saw podiatry on January 4 of this year where he underwent debridement. It was felt he would benefit from specialized wound care and he was referred to our center for further evaluation and management. On the dorsal aspect of his right great toe, there is a circular ulcer. There is a fair amount of undermining from callus and dry skin. The surface is a bit pale Oxend[place], Cree ([id number]) [phone].pdf Page 6 of 10 and fibrotic, but there is no obvious necrosis. 10/19/2022: He has been approved by his insurance to undergo hyperbaric oxygen therapy. EKG has been obtained and is normal. We are still waiting on his chest x-ray. The wound is clean with just a little bit of periwound callus and slough on the surface. Tendon is  exposed. 10/26/2022: Unfortunately, the required co-pay from his insurance is cost prohibitive for him and he will not be able to receive hyperbaric oxygen therapy. Some moisture got under the dry skin around his wound and lifted it off but there is good epithelialized tissue beneath this. The exposed tendon has deteriorated. 11/03/2022: After some discussion with the financial office, it has become feasible for the patient to undergo hyperbaric oxygen therapy and he has received his first treatments this week. The wound is a little bit smaller and the tissue has a better color to it, but there is still extensive nonviable subcutaneous tissue and slough present. 11/09/2022: The wound measured a little bit smaller today. There is still slough and nonviable subcu tissue and tendon present. 11/16/2022: The wound continues to contract. There is still substantial depth to it, however, and bone remains exposed at the base. This is also a (early) 30-day update for HBO. 11/27/2022: Unfortunately, the wound has deteriorated. It appears that the tissue has become macerated. There is a second ulcer on the lateral aspect of his great toe. The nail is lifting off of the bed and there is wet skin lifting off of the underlying tissue. The initial wound is smaller but still has a fair amount of depth to it. The patient does report quite a bit more drainage since his last visit. 12/20/2022: For scheduling reasons, somehow we have missed having a wound care visit in between his hyperbaric oxygen therapy treatments. The 2 openings of the wound have converged into 1 large site. There is necrotic tendon present at the lateral aspect of the great toe. The bone is visible, including the joint space. The area of the initial wound is flush with the surrounding skin. No malodor or purulent drainage. Patient History Information obtained from Patient. Family History Diabetes - Mother,Siblings, Heart Disease - Siblings, Hypertension -  Siblings, Seizures - Father, Stroke - Siblings, No family history of Cancer, Hereditary Spherocytosis, Kidney Disease, Lung Disease, Thyroid Problems, Tuberculosis. Social History Former smoker, Marital Status - Married, Alcohol Use - Rarely, Drug Use - No History, Caffeine Use - Daily. Medical History Eyes Patient has history of Cataracts - rt eye Ear/Nose/Mouth/Throat Denies history of Chronic sinus problems/congestion Hematologic/Lymphatic Denies history of Anemia, Hemophilia, Human Immunodeficiency Virus, Lymphedema, Sickle Cell Disease Respiratory Denies history of Aspiration, Asthma, Chronic Obstructive Pulmonary Disease (COPD), Pneumothorax, Tuberculosis Cardiovascular Patient has history of Hypertension Gastrointestinal Denies history of Cirrhosis , Colitis, Crohnoos, Hepatitis A, Hepatitis  B, Hepatitis C Endocrine Patient has history of Type II Diabetes Genitourinary Denies history of End Stage Renal Disease Immunological Denies history of Lupus Erythematosus, Raynaudoos, Scleroderma Integumentary (Skin) Denies history of History of Burn Musculoskeletal Patient has history of Osteomyelitis - MRI confirmed (08/16/22) Denies history of Gout Neurologic Patient has history of Neuropathy Psychiatric Patient has history of Confinement Anxiety Denies history of Anorexia/bulimia Hospitalization/Surgery History - AV fistula place left upper arm 10/20/22. Medical A Surgical History Notes nd Genitourinary CKD IV Objective Constitutional Hypertensive, asymptomatic. Slightly tachycardic. no acute distress. Vitals Time Taken: 10:27 AM, Height: 72 in, Weight: 185 lbs, BMI: 25.1, Temperature: 97.8 F, Pulse: 103 bpm, Respiratory Rate: 16 breaths/min, Blood Pressure: 193/91 mmHg. Respiratory [place], Raffael ([id number]) [id number] Page 7 of 10 Normal work of breathing on room air. General Notes: 12/20/2022: The 2 openings of the wound have converged  into 1 large site. There is necrotic tendon present at the lateral aspect of the great toe. The bone is visible, including the joint space. The area of the initial wound is flush with the surrounding skin. No malodor or purulent drainage. Integumentary (Hair, Skin) Wound #1 status is Open. Original cause of wound was Pressure Injury. The date acquired was: 08/16/2022. The wound has been in treatment 9 weeks. The wound is located on the Right,Medial T Great. The wound measures 2cm length x 3.1cm width x 0.3cm depth; 4.869cm^2 area and 1.461cm^3 volume. There oe is bone, tendon, and Fat Layer (Subcutaneous Tissue) exposed. There is no tunneling or undermining noted. There is a medium amount of serosanguineous drainage noted. The wound margin is distinct with the outline attached to the wound base. There is small (1-33%) pink granulation within the wound bed. There is a large (67-100%) amount of necrotic tissue within the wound bed including Adherent Slough. The periwound skin appearance had no abnormalities noted for texture. The periwound skin appearance had no abnormalities noted for moisture. The periwound skin appearance had no abnormalities noted for color. Periwound temperature was noted as No Abnormality. Assessment Active Problems ICD-10 Non-pressure chronic ulcer of other 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 Type 2 diabetes mellitus with foot ulcer Chronic kidney disease, stage 5 Osteomyelitis, unspecified Type 2 diabetes mellitus with diabetic polyneuropathy Essential (primary) hypertension Procedures Wound #1 Pre-procedure diagnosis of Wound #1 is a Diabetic Wound/Ulcer of the Lower Extremity located on the Right,Medial T Great .Severity of Tissue Pre oe Debridement is: Bone involvement without necrosis. There was a Excisional Skin/Subcutaneous Tissue/Muscle Debridement with a total area of 6.2 sq cm performed by Fredirick Maudlin, MD. With the following instrument(s): Curette,  Forceps, and Scissors to remove Non-Viable tissue/material. Material removed includes Tendon, Subcutaneous Tissue, and Slough after achieving pain control using Lidocaine 4% T opical Solution. No specimens were taken. A time out was conducted at 10:35, prior to the start of the procedure. A Minimum amount of bleeding was controlled with Pressure. The procedure was tolerated well. Post Debridement Measurements: 2cm length x 3.1cm width x 0.3cm depth; 1.461cm^3 volume. Character of Wound/Ulcer Post Debridement is improved. Severity of Tissue Post Debridement is: Fat layer exposed. Post procedure Diagnosis Wound #1: Same as Pre-Procedure General Notes: scribed for Dr. Celine Ahr by Adline Peals, RN. Plan Follow-up Appointments: Return Appointment in 1 week. - Dr. Celine Ahr Rm 1 Anesthetic: Wound #1 Right,Medial T Great: oe (In clinic) Topical Lidocaine 4% applied to wound bed Bathing/ Shower/ Hygiene: May shower and wash wound with soap and water. Off-Loading: Wound #1 Right,Medial T Great: oe Other: -  Diabetic shoe Hyperbaric Oxygen Therapy: Evaluate for HBO Therapy Indication: - chronic refractory osteomyelitis If appropriate for treatment, begin HBOT per protocol: 2.5 ATA for 90 Minutes with 2 Five (5) Minute Air Breaks T Number of Treatments: - 40 otal One treatments per day (delivered Monday through Friday unless otherwise specified in Special Instructions below): Finger stick Blood Glucose Pre- and Post- HBOT Treatment. Follow Hyperbaric Oxygen Glycemia Protocol Give two 4oz orange juices in addition to Glucerna when the glycemic protocol is used. Afrin (Oxymetazoline HCL) 0.05% nasal spray - 1 spray in both nostrils daily as needed prior to HBO treatment for difficulty clearing ears The following medication(s) was prescribed: lidocaine topical 4 % cream cream topical was prescribed at facility gentamicin topical 0.1 % ointment Apply to wound with dressing changes starting  12/20/2022 WOUND #1: - T Great Wound Laterality: Right, Medial oe Cleanser: Soap and Water 1 x Per Day/30 Days Discharge Instructions: May shower and wash wound with dial antibacterial soap and water prior to dressing change. Cleanser: Wound Cleanser 1 x Per Day/30 Days Discharge Instructions: Cleanse the wound with wound cleanser prior to applying a clean dressing using gauze sponges, not tissue or cotton balls. Peri-Wound Care: Zinc Oxide Ointment 30g tube 1 x Per Day/30 Days Discharge Instructions: Apply Zinc Oxide to periwound with each dressing change Topical: Gentamicin 1 x Per Day/30 Days Discharge Instructions: As directed by physician Prim Dressing: Hydrofera Blue Ready Transfer Foam, 2.5x2.5 (in/in) 1 x Per Day/30 Days Quyen Bondoc, Lashun ([id number]) [phone].pdf Page 8 of 10 Discharge Instructions: Apply directly to wound bed as directed Secondary Dressing: Woven Gauze Sponge, Non-Sterile 4x4 in 1 x Per Day/30 Days Discharge Instructions: Apply over primary dressing as directed. Secured With: Child psychotherapist, Sterile 2x75 (in/in) 1 x Per Day/30 Days Discharge Instructions: Secure with stretch gauze as directed. Secured With: 52M Medipore Public affairs consultant Surgical T 2x10 (in/yd) 1 x Per Day/30 Days ape Discharge Instructions: Secure with tape as directed. 12/20/2022: The 2 openings of the wound have converged into 1 large site. There is necrotic tendon present at the lateral aspect of the great toe. The bone is visible, including the joint space. The area of the initial wound is flush with the surrounding skin. No malodor or purulent drainage. I used forceps, scissors, and a scalpel to debride the necrotic tendon away from the wound. I used a curette to debride some slough from the more medial aspect of the wound. We will continue topical gentamicin and Hydrofera Blue. Continue hyperbaric oxygen therapy, but I am going to increase his depth to  2.5 atm with two 5-minute air breaks. He will remove his freestyle libre monitor during his treatments. Electronic Signature(s) Signed: 12/20/2022 11:13:25 AM By: Fredirick Maudlin MD FACS Previous Signature: 12/20/2022 11:11:13 AM Version By: Fredirick Maudlin MD FACS Entered By: Fredirick Maudlin on 12/20/2022 11:13:25 -------------------------------------------------------------------------------- HxROS Details Patient Name: Date of Service: [facility] [name], [name] 12/20/2022 10:00 [place] Record Number: [id number] Patient Account Number: [id number] Date of Birth/Sex: Treating RN: Sep 13, 1951 (72 y.o. M) Primary Care Provider: [name] Other Clinician: Referring Provider: Treating Provider/Extender: [name] Weeks in Treatment: 9 Information Obtained From Patient Eyes Medical History: Positive for: Cataracts - rt eye Ear/Nose/Mouth/Throat Medical History: Negative for: Chronic sinus problems/congestion Hematologic/Lymphatic Medical History: Negative for: Anemia; Hemophilia; Human Immunodeficiency Virus; Lymphedema; Sickle Cell Disease Respiratory Medical History: Negative for: Aspiration; Asthma; Chronic Obstructive Pulmonary Disease (COPD); Pneumothorax; Tuberculosis Cardiovascular Medical History: Positive for: Hypertension Gastrointestinal Medical History: Negative  for: Cirrhosis ; Colitis; Crohns; Hepatitis A; Hepatitis B; Hepatitis C Endocrine Medical History: Positive for: Type II Diabetes Time with diabetes: 2000 Treated with: Insulin Blood sugar tested every day: Yes Tested Abhilash Emens, Reece ([id number]) [id number] Page 9 of 10 Genitourinary Medical History: Negative for: End Stage Renal Disease Past Medical History Notes: CKD IV Immunological Medical History: Negative for: Lupus Erythematosus; Raynauds; Scleroderma Integumentary (Skin) Medical History: Negative for: History of Burn Musculoskeletal Medical  History: Positive for: Osteomyelitis - MRI confirmed (08/16/22) Negative for: Gout Neurologic Medical History: Positive for: Neuropathy Psychiatric Medical History: Positive for: Confinement Anxiety Negative for: Anorexia/bulimia HBO Extended History Items Eyes: Cataracts Immunizations Pneumococcal Vaccine: Received Pneumococcal Vaccination: Yes Received Pneumococcal Vaccination On or After 60th Birthday: Yes Implantable Devices None Hospitalization / Surgery History Type of Hospitalization/Surgery AV fistula place left upper arm 10/20/22 Family and Social History Cancer: No; Diabetes: Yes - Mother,Siblings; Heart Disease: Yes - Siblings; Hereditary Spherocytosis: No; Hypertension: Yes - Siblings; Kidney Disease: No; Lung Disease: No; Seizures: Yes - Father; Stroke: Yes - Siblings; Thyroid Problems: No; Tuberculosis: No; Former smoker; Marital Status - Married; Alcohol Use: Rarely; Drug Use: No History; Caffeine Use: Daily; Financial Concerns: No; Food, Clothing or Shelter Needs: No; Support System Lacking: No; Transportation Concerns: No Engineer, maintenance) Signed: 12/20/2022 3:48:41 PM By: Fredirick Maudlin MD FACS Entered By: Fredirick Maudlin on 12/20/2022 11:07:28 -------------------------------------------------------------------------------- SuperBill Details Patient Name: Date of Service: [facility], [name] 12/20/2022 Medical Record Number: [id number] Patient Account Number: [id number] Date of Birth/Sex: Treating RN: 02/18/1951 (72 y.o. M) Primary Care Provider: [name] Other Clinician: Referring Provider: Treating Provider/Extender: [name] Weeks in Treatment: [address] Diagnosis Coding Grimes, Deandrae ([id number]) [id number] Page 10 of 10 ICD-10 Codes Code Description L97.514 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.621 Type 2 diabetes mellitus with foot ulcer N18.5 Chronic kidney  disease, stage 5 M86.9 Osteomyelitis, unspecified E11.42 Type 2 diabetes mellitus with diabetic polyneuropathy I10 Essential (primary) hypertension Facility Procedures : CPT4 Code: [id number] Description: [id number] - DEB MUSC/FASCIA 20 SQ CM/< ICD-10 Diagnosis Description L97.514 Non-pressure chronic ulcer of other 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 Modifier: Quantity: 1 Physician Procedures : CPT4 Code Description Modifier [id number] - WC PHYS LEVEL 4 - EST PT 25 ICD-10 Diagnosis Description L97.514 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.621 Type 2 diabetes mellitus with foot ulcer M86.9  Osteomyelitis, unspecified N18.5 Chronic kidney disease, stage 5 Quantity: 1 : [id number] - WC PHYS DEBR MUSCLE/FASCIA 20 SQ CM ICD-10 Diagnosis Description L97.514 Non-pressure chronic ulcer of other 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 Quantity: 1 Electronic Signature(s) Signed: 12/20/2022 11:14:53 AM By: Fredirick Maudlin MD FACS Entered By: Fredirick Maudlin on 12/20/2022 11:14:52

## 2022-12-22 NOTE — Progress Notes (Signed)
[name] ([id number]) [id number] Page 1 of 7 Visit Report for 12/20/2022 Arrival Information Details Patient Name: Date of Service: [name], [name] 12/20/2022 10:00 [place] Record Number: [id number] Patient Account Number: [id number] Date of Birth/Sex: Treating RN: [birth year] (72 y.o. [name] Primary Care Axl Rodino: [name] Other Clinician: Referring Jacalyn Biggs: Treating Jolisa Intriago/Extender: [name] Weeks in Treatment: 9 Visit Information History Since Last Visit Added or deleted any medications: No Patient Arrived: [name] Any new allergies or adverse reactions: No Arrival Time: 10:25 Had a fall or experienced change in No Accompanied By: family activities of daily living that may affect Transfer Assistance: None risk of falls: Patient Identification Verified: Yes Signs or symptoms of abuse/neglect since last visito No Secondary Verification Process Completed: Yes Hospitalized since last visit: No Patient Requires Transmission-Based Precautions: No Implantable device outside of the clinic excluding No Patient Has Alerts: No cellular tissue based products placed in the [name] since last visit: Has Dressing in Place as Prescribed: Yes Pain Present Now: No Electronic Signature(s) Signed: 12/21/2022 4:19:16 PM By: Blanche East RN Entered By: Blanche East on 12/20/2022 10:25:46 -------------------------------------------------------------------------------- Encounter Discharge Information Details Patient Name: Date of Service: [name], [name] 12/20/2022 10:00 [place] Record Number: [id number] Patient Account Number: [id number] Date of Birth/Sex: Treating RN: 1950/11/05 (72 y.o. [name] Primary Care Kymoni Lesperance: [name] Other Clinician: Referring Ibraham Levi: Treating Kresta Templeman/Extender: [name] Weeks in Treatment: 9 Encounter Discharge Information Items Post Procedure  Vitals Discharge Condition: Stable Temperature (F): 97.8 Ambulatory Status: Cane Pulse (bpm): 103 Discharge Destination: Home Respiratory Rate (breaths/min): 16 Transportation: Private Auto Blood Pressure (mmHg): 193/91 Accompanied By: family Schedule Follow-up Appointment: Yes Clinical Summary of Care: Patient Declined Electronic Signature(s) Signed: 12/20/2022 4:37:09 PM By: Adline Peals Entered By: Adline Peals on 12/20/2022 10:55:38 -------------------------------------------------------------------------------- Lower Extremity Assessment Details Patient Name: Date of Service: [name], [name] 12/20/2022 10:00 [place] Record Number: [id number] Patient Account Number: [id number] Date of Birth/Sex: Treating RN: 11/15/1950 (72 y.o. [name] Primary Care Dondrell Loudermilk: [name] Other Clinician: Referring Salina Stanfield: Treating Laiklyn Pilkenton/Extender: [name] Weeks in Treatment: 9 Edema Assessment Assessed: [Left: No] [Right: No] T[LeftYOSSI, [name] ID:[id number] [[id number] Page 2 of 7] Edema: [Left: N] [Right: o] Calf Left: Right: Point of Measurement: From Medial Instep 36 cm Ankle Left: Right: Point of Measurement: From Medial Instep 22 cm Electronic Signature(s) Signed: 12/21/2022 4:19:16 PM By: Blanche East RN Entered By: Blanche East on 12/20/2022 10:27:31 -------------------------------------------------------------------------------- Multi Wound Chart Details Patient Name: Date of Service: [name] [name] 12/20/2022 10:00 [place] Record Number: [id number] Patient Account Number: [id number] Date of Birth/Sex: Treating RN: Nov 16, 1950 (72 y.o. M) Primary Care Ardice Boyan: [name] Other Clinician: Referring Canda Podgorski: Treating Trixy Loyola/Extender: [name] Weeks in Treatment: 9 Vital Signs Height(in): 72 Pulse(bpm): 103 Weight(lbs): 185 Blood Pressure(mmHg): 193/91 Body  Mass Index(BMI): 25.1 Temperature(F): 97.8 Respiratory Rate(breaths/min): 16 [1:Photos:] [N/A:N/A] Right, Medial T Great oe N/A N/A Wound Location: Pressure Injury N/A N/A Wounding Event: Diabetic Wound/Ulcer of the Lower N/A N/A Primary Etiology: Extremity Acute Osteomyelitis N/A N/A Secondary Etiology: Cataracts, Hypertension, Type II N/A N/A Comorbid History: Diabetes, Osteomyelitis, Neuropathy, Confinement Anxiety 08/16/2022 N/A N/A Date Acquired: 9 N/A N/A Weeks of Treatment: Open N/A N/A Wound Status: No N/A N/A Wound Recurrence: 2x3.1x0.3 N/A N/A Measurements L x W x D (cm) 4.869 N/A N/A A (cm) : rea 1.461 N/A N/A Volume (cm) : -1670.50% N/A N/A % Reduction  in A rea: -1228.20% N/A N/A % Reduction in Volume: Grade 3 N/A N/A Classification: Medium N/A N/A Exudate A mount: Serosanguineous N/A N/A Exudate Type: red, brown N/A N/A Exudate Color: Distinct, outline attached N/A N/A Wound Margin: Small (1-33%) N/A N/A Granulation A mount: Pink N/A N/A Granulation Quality: Large (67-100%) N/A N/A Necrotic A mount: Fat Layer (Subcutaneous Tissue): Yes N/A N/A Exposed Structures: Tendon: Yes Bone: Yes Fascia: No Muscle: No Joint: No Small (1-33%) N/A N/A Epithelialization: Debridement - Excisional N/A N/A DebridementABDULAZIZ, CHAPLAIN ([id number].pdf Page 3 of 7 10:35 N/A N/A Pre-procedure Verification/Time Out Taken: Lidocaine 4% Topical Solution N/A N/A Pain Control: Tendon, Subcutaneous, Slough N/A N/A Tissue Debrided: Skin/Subcutaneous Tissue/Muscle N/A N/A Level: 6.2 N/A N/A Debridement A (sq cm): rea Curette, Forceps, Scissors N/A N/A Instrument: Minimum N/A N/A Bleeding: Pressure N/A N/A Hemostasis A chieved: Procedure was tolerated well N/A N/A Debridement Treatment Response: 2x3.1x0.3 N/A N/A Post Debridement Measurements L x W x D (cm) 1.461 N/A N/A Post Debridement Volume:  (cm) Callus: Yes N/A N/A Periwound Skin Texture: Maceration: Yes N/A N/A Periwound Skin Moisture: No Abnormalities Noted N/A N/A Periwound Skin Color: No Abnormality N/A N/A Temperature: Debridement N/A N/A Procedures Performed: Treatment Notes Wound #1 (Toe Great) Wound Laterality: Right, Medial Cleanser Soap and Water Discharge Instruction: May shower and wash wound with dial antibacterial soap and water prior to dressing change. Wound Cleanser Discharge Instruction: Cleanse the wound with wound cleanser prior to applying a clean dressing using gauze sponges, not tissue or cotton balls. Peri-Wound Care Zinc Oxide Ointment 30g tube Discharge Instruction: Apply Zinc Oxide to periwound with each dressing change Topical Gentamicin Discharge Instruction: As directed by physician Primary Dressing Hydrofera Blue Ready Transfer Foam, 2.5x2.5 (in/in) Discharge Instruction: Apply directly to wound bed as directed Secondary Dressing Woven Gauze Sponge, Non-Sterile 4x4 in Discharge Instruction: Apply over primary dressing as directed. Secured With Conforming Stretch Gauze Bandage, Sterile 2x75 (in/in) Discharge Instruction: Secure with stretch gauze as directed. 7M Medipore Soft Cloth Surgical T 2x10 (in/yd) ape Discharge Instruction: Secure with tape as directed. Compression Wrap Compression Stockings Add-Ons Electronic Signature(s) Signed: 12/20/2022 11:05:37 AM By: Fredirick Maudlin MD FACS Entered By: Fredirick Maudlin on 12/20/2022 11:05:37 -------------------------------------------------------------------------------- Multi-Disciplinary Care Plan Details Patient Name: Date of Service: [name], [name] 12/20/2022 10:00 A M Medical Record Number: [id number] Patient Account Number: [id number] Date of Birth/Sex: Treating RN: Feb 16, 1951 (72 y.o. [name] Primary Care Katryna Tschirhart: [name] Other Clinician: Referring Jefferson Fullam: Treating Breeze Berringer/Extender: [name] Weeks in Treatment: [address], [name] ([id number]) [id number] Page 4 of 7 Multidisciplinary Care Plan reviewed with physician Active Inactive Nutrition Nursing Diagnoses: Impaired glucose control: actual or potential Goals: Patient/caregiver will maintain therapeutic glucose control Date Initiated: 10/12/2022 Target Resolution Date: 12/28/2022 Goal Status: Active Interventions: Provide education on elevated blood sugars and impact on wound healing Provide education on nutrition Treatment Activities: Dietary management education, guidance and counseling : 10/12/2022 Giving encouragement to exercise : 10/12/2022 Notes: Osteomyelitis Nursing Diagnoses: Infection: osteomyelitis Goals: Patient/caregiver will verbalize understanding of disease process and disease management Date Initiated: 10/12/2022 Target Resolution Date: 12/28/2022 Goal Status: Active Interventions: Assess for signs and symptoms of osteomyelitis resolution every visit Provide education on osteomyelitis Treatment Activities: MRI : 08/16/2022 Notes: Wound/Skin Impairment Nursing Diagnoses: Impaired tissue integrity Goals: Ulcer/skin breakdown will have a volume reduction of 30% by week 4 Date Initiated: 10/12/2022 Target Resolution Date: 12/28/2022 Goal Status: Active Interventions: Assess ulceration(s) every visit Provide education on smoking Treatment Activities: Skin  care regimen initiated : 10/12/2022 Smoking cessation education : 10/12/2022 Notes: Electronic Signature(s) Signed: 12/21/2022 4:19:16 PM By: Blanche East RN Entered By: Blanche East on 12/20/2022 10:41:35 -------------------------------------------------------------------------------- Pain Assessment Details Patient Name: Date of Service: [name], [name] 12/20/2022 10:00 [place] Record Number: [id number] Patient Account Number: [id number] Date of Birth/Sex: Treating RN: Feb 15, 1951 (72 y.o.  [name] Primary Care Brenlynn Fake: [name] Other Clinician: Referring Chyler Creely: Treating Chanci Ojala/Extender: [name] [place], [name] ([id number]) [id number] Page 5 of 7 Weeks in Treatment: 9 Active Problems Location of Pain Severity and Description of Pain Patient Has Paino No Site Locations Rate the pain. Current Pain Level: 0 Pain Management and Medication Current Pain Management: Electronic Signature(s) Signed: 12/21/2022 4:19:16 PM By: Blanche East RN Entered By: Blanche East on 12/20/2022 10:27:27 -------------------------------------------------------------------------------- Patient/Caregiver Education Details Patient Name: Date of Service: [name], [name] 3/20/2024andnbsp10:00 [name] Record Number: [id number] Patient Account Number: [id number] Date of Birth/Gender: Treating RN: Oct 18, 1950 (72 y.o. [name] Primary Care Physician: [name] Other Clinician: Referring Physician: Treating Physician/Extender: Levin Erp in Treatment: 9 Education Assessment Education Provided To: Patient Education Topics Provided Wound/Skin Impairment: Methods: Explain/Verbal Responses: Reinforcements needed, State content correctly Electronic Signature(s) Signed: 12/21/2022 4:19:16 PM By: Blanche East RN Entered By: Blanche East on 12/20/2022 10:41:45 -------------------------------------------------------------------------------- Wound Assessment Details Patient Name: Date of Service: [name], [name] 12/20/2022 10:00 A M Medical Record Number: [id number] Patient Account Number: [id number] Date of Birth/Sex: Treating RN: Jul 25, 1951 (72 y.o. [name] Primary Care Amanat Hackel: [name] Other Clinician: Referring Dorrie Cocuzza: Treating Mcdonald Reiling/Extender: [name] Weeks in Treatment: [address] [name] ([id number]) [id number] Page 6 of 7 Wound  Status Wound Number: 1 Primary Diabetic Wound/Ulcer of the Lower Extremity Etiology: Wound Location: Right, Medial T Great oe Secondary Acute Osteomyelitis Wounding Event: Pressure Injury Etiology: Date Acquired: 08/16/2022 Wound Open Weeks Of Treatment: 9 Status: Clustered Wound: No Comorbid Cataracts, Hypertension, Type II Diabetes, Osteomyelitis, History: Neuropathy, Confinement Anxiety Photos Wound Measurements Length: (cm) 2 Width: (cm) 3.1 Depth: (cm) 0.3 Area: (cm) 4.869 Volume: (cm) 1.461 % Reduction in Area: -1670.5% % Reduction in Volume: -1228.2% Epithelialization: Small (1-33%) Tunneling: No Undermining: No Wound Description Classification: Grade 3 Wound Margin: Distinct, outline attached Exudate Amount: Medium Exudate Type: Serosanguineous Exudate Color: red, brown Foul Odor After Cleansing: No Slough/Fibrino Yes Wound Bed Granulation Amount: Small (1-33%) Exposed Structure Granulation Quality: Pink Fascia Exposed: No Necrotic Amount: Large (67-100%) Fat Layer (Subcutaneous Tissue) Exposed: Yes Necrotic Quality: Adherent Slough Tendon Exposed: Yes Muscle Exposed: No Joint Exposed: No Bone Exposed: Yes Periwound Skin Texture Texture Color No Abnormalities Noted: Yes No Abnormalities Noted: Yes Moisture Temperature / Pain No Abnormalities Noted: Yes Temperature: No Abnormality Treatment Notes Wound #1 (Toe Great) Wound Laterality: Right, Medial Cleanser Soap and Water Discharge Instruction: May shower and wash wound with dial antibacterial soap and water prior to dressing change. Wound Cleanser Discharge Instruction: Cleanse the wound with wound cleanser prior to applying a clean dressing using gauze sponges, not tissue or cotton balls. Peri-Wound Care Zinc Oxide Ointment 30g tube Discharge Instruction: Apply Zinc Oxide to periwound with each dressing change Topical Gentamicin Discharge Instruction: As directed by physician Primary  Dressing [name] Ready Transfer Foam, 2.5x2.5 (in/in) Jipson, [place] ([id number]) [id number].pdf Page 7 of 7 Discharge Instruction: Apply directly to wound bed as directed Secondary Dressing Woven Gauze Sponge, Non-Sterile 4x4 in Discharge Instruction: Apply over primary dressing as directed. Secured With Psychologist, occupational Gauze  Bandage, Sterile 2x75 (in/in) Discharge Instruction: Secure with stretch gauze as directed. 58M Medipore Soft Cloth Surgical T 2x10 (in/yd) ape Discharge Instruction: Secure with tape as directed. Compression Wrap Compression Stockings Add-Ons Electronic Signature(s) Signed: 12/20/2022 4:37:09 PM By: Adline Peals Signed: 12/21/2022 4:19:16 PM By: Blanche East RN Entered By: Adline Peals on 12/20/2022 10:31:27 -------------------------------------------------------------------------------- Vitals Details Patient Name: Date of Service: [name], [name] 12/20/2022 10:00 A M Medical Record Number: [id number] Patient Account Number: [id number] Date of Birth/Sex: Treating RN: 10-Feb-1951 (72 y.o. [name] Primary Care Ashanti Ratti: [name] Other Clinician: Referring Lark Langenfeld: Treating Ceasia Elwell/Extender: [name] Weeks in Treatment: 9 Vital Signs Time Taken: 10:27 Temperature (F): 97.8 Height (in): 72 Pulse (bpm): 103 Weight (lbs): 185 Respiratory Rate (breaths/min): 16 Body Mass Index (BMI): 25.1 Blood Pressure (mmHg): 193/91 Reference Range: 80 - 120 mg / dl Electronic Signature(s) Signed: 12/21/2022 4:19:16 PM By: Blanche East RN Entered By: Blanche East on 12/20/2022 10:27:21

## 2022-12-23 NOTE — Progress Notes (Signed)
Sedan, Zakhari ([id number]) [id number] Page 1 of 1 Visit Report for 12/22/2022 SuperBill Details Patient Name: Date of Service: [name], [name] 12/22/2022 Medical Record Number: [id number] Patient Account Number: [id number] Date of Birth/Sex: Treating RN: 04/26/1951 (72 y.o. 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, stage 5 M86.9 Osteomyelitis, unspecified E11.42 Type 2 diabetes mellitus with diabetic polyneuropathy I10 Essential (primary) hypertension Facility Procedures CPT4 Code Description Modifier Quantity [id number] G0277-(Facility Use Only) HBOT full body chamber, 18min , 4 ICD-10 Diagnosis Description M86.9 Osteomyelitis, unspecified 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.621 Type 2 diabetes mellitus with foot ulcer Physician Procedures Quantity CPT4 Code Description Modifier [id number] - WC PHYS HYPERBARIC OXYGEN THERAPY 1 ICD-10 Diagnosis Description M86.9 Osteomyelitis, unspecified 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

## 2022-12-23 NOTE — Progress Notes (Signed)
[name] ([id number]) [id number] Page 1 of 2 Visit Report for 12/22/2022 HBO Details Patient Name: Date of Service: [name], [name] 12/22/2022 12:00 PM Medical Record Number: [id number] Patient Account Number: [id number] Date of Birth/Sex: Treating RN: August 01, 1951 (72 y.o. 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 HBO Treatment Details Treatment Number: 34 Patient Type: Outpatient Chamber Type: Monoplace Chamber Serial #: [id number] Treatment Protocol: 2.5 ATA with 90 minutes oxygen, with two 5 minute air breaks Treatment Details Compression Rate Down: 1.5 psi / minute De-Compression Rate Up: 2.0 psi / minute A breaks and breathing ir Compress Tx Pressure periods Decompress Decompress Begins Reached (leave unused spaces Begins Ends blank) Chamber Pressure (ATA 1 2.5 2.5 2.5 2.5 2.5 - - 2.5 1 ) Clock Time (24 hr) 11:42 11:58 12:28 12:32 13:02 13:07 - - 13:37 13:50 Treatment Length: 128 (minutes) Treatment Segments: 4 Vital Signs Capillary Blood Glucose Reference Range: 80 - 120 mg / dl HBO Diabetic Blood Glucose Intervention Range: <131 mg/dl or >249 mg/dl Type: Time Vitals Blood Respiratory Capillary Blood Glucose Pulse Action Pulse: Temperature: Taken: Pressure: Rate: Glucose (mg/dl): Meter #: Oximetry (%) Taken: Pre 11:25 165/80 104 18 98.2 203 1 none per protocol Post 14:05 156/68 88 18 98 178 1 none per protocol Treatment Response Treatment Toleration: Well Treatment Completion Status: Treatment Completed without Adverse Event Treatment Notes Mr. Mckellips arrived  today with a measured blood glucose level of 203 mg/dL. He prepared for treatment and his pre treatment vital signs were within range. After performing a safety check, patient was placed in the chamber which was compressed with 100% oxygen at a rate of 2 psi/min after confirming normal ear equalization. He tolerated the treatment and the subsequent decompression of the chamber at 2 psi/min. Mr. Yoffe denied any symptoms related to barotrauma. Post-treatment vital signs were within normal range. He was stable upon discharge. Physician HBO Attestation: I certify that I supervised this HBO treatment in accordance with Medicare guidelines. A trained emergency response team is readily available per Yes hospital policies and procedures. Continue HBOT as ordered. Yes Electronic Signature(s) Signed: 12/22/2022 4:20:29 PM By: Fredirick Maudlin MD FACS Previous Signature: 12/22/2022 3:17:50 PM Version By: Donavan Burnet CHT EMT BS , , Entered By: Fredirick Maudlin on 12/22/2022 16:20:29 -------------------------------------------------------------------------------- HBO Safety Checklist Details Patient Name: Date of Service: [facility], [name] 12/22/2022 12:00 PM Medical Record Number: [id number] Patient Account Number: [id number] Date of Birth/Sex: Treating RN: 1951/06/22 (72 y.o. [name] Primary Care Jorden Mahl: [name] Other Clinician: Valeria Batman [place], [name] ([id number]) [id number] Page 2 of 2 Referring Kaybree [name]: Treating Kingsley Herandez/Extender: [name] Weeks in Treatment: 10 HBO Safety Checklist Items Safety Checklist Consent Form Signed Patient voided / foley secured and emptied When did you last eato 1000 Last dose of injectable or oral agent yesterday Ostomy pouch emptied and vented if applicable NA All implantable devices assessed, documented and approved NA Intravenous access site secured and place NA Valuables  secured Linens and cotton and cotton/polyester blend (less than 51% polyester) Personal oil-based products / skin lotions / body lotions removed Wigs or hairpieces removed NA Smoking or tobacco materials removed NA Books / newspapers / magazines / loose paper removed Cologne, [employer],  perfume and deodorant removed Jewelry removed (may wrap wedding band) Make-up removed NA Hair care products removed Battery operated devices (external) removed Heating patches and chemical warmers removed Titanium eyewear removed Nail polish cured greater than 10 hours NA Casting material cured greater than 10 hours NA Hearing aids removed NA Loose dentures or partials removed dentures removed Prosthetics have been removed NA Patient demonstrates correct use of air break device (if applicable) Patient concerns have been addressed Patient grounding bracelet on and cord attached to chamber Specifics for Inpatients (complete in addition to above) Medication sheet sent with patient NA Intravenous medications needed or due during therapy sent with patient NA Drainage tubes (e.g. nasogastric tube or chest tube secured and vented) NA Endotracheal or Tracheotomy tube secured NA Cuff deflated of air and inflated with saline NA Airway suctioned NA Notes Paper version used prior to treatment start. Electronic Signature(s) Signed: 12/22/2022 3:37:08 PM By: Donavan Burnet CHT EMT BS , , Previous Signature: 12/22/2022 3:12:39 PM Version By: Donavan Burnet CHT EMT BS , , Entered By: Donavan Burnet on 12/22/2022 15:37:08

## 2022-12-23 NOTE — Progress Notes (Signed)
[place], [place] ([id number]) [id number] Page 1 of 2 Visit Report for 12/22/2022 Arrival Information Details Patient Name: Date of Service: [name], [name] 12/22/2022 12:00 PM Medical Record Number: [id number] Patient Account Number: [id number] Date of Birth/Sex: Treating RN: Oct 31, 1950 (72 y.o. [name] Primary Care Vidhi Delellis: [name] Other Clinician: Donavan Burnet Referring Delylah Stanczyk: Treating Syana Degraffenreid/Extender: [name] Weeks in Treatment: 10 Visit Information History Since Last Visit All ordered tests and consults were completed: Yes Patient Arrived: [name] Added or deleted any medications: No Arrival Time: 11:12 Any new allergies or adverse reactions: No Accompanied By: self Had a fall or experienced change in No Transfer Assistance: None activities of daily living that may affect Patient Identification Verified: Yes risk of falls: Secondary Verification Process Completed: Yes Signs or symptoms of abuse/neglect since last visito No Patient Requires Transmission-Based Precautions: No Hospitalized since last visit: No Patient Has Alerts: No Implantable device outside of the clinic excluding No cellular tissue based products placed in the center since last visit: Pain Present Now: No Electronic Signature(s) Signed: 12/22/2022 3:11:22 PM By: Donavan Burnet CHT EMT BS , , Entered By: Donavan Burnet on 12/22/2022 15:11:21 -------------------------------------------------------------------------------- Encounter Discharge Information Details Patient Name: Date of Service: [name], [name] 12/22/2022 12:00 PM Medical Record Number: [id number] Patient Account Number: [id number] Date of Birth/Sex: Treating RN: August 18, 1951 (72 y.o. [name] Primary Care Kiely Cousar: [name] Other Clinician: Donavan Burnet Referring Parvin Stetzer: Treating Faysal Fenoglio/Extender: [name] Weeks in  Treatment: 10 Encounter Discharge Information Items Discharge Condition: Stable Ambulatory Status: Cane Discharge Destination: Home Transportation: Private Auto Accompanied By: self Schedule Follow-up Appointment: No Clinical Summary of Care: Electronic Signature(s) Signed: 12/22/2022 3:19:56 PM By: Donavan Burnet CHT EMT BS , , Entered By: Donavan Burnet on 12/22/2022 15:19:55 -------------------------------------------------------------------------------- Vitals Details Patient Name: Date of Service: [name], [name] 12/22/2022 12:00 PM Medical Record Number: [id number] Patient Account Number: [id number] Date of Birth/Sex: Treating RN: [birth year] (72 y.o. [name] Primary Care Jazmeen Axtell: [name] Other Clinician: Valeria Batman Referring Bridgitt Raggio: Treating Siriah Treat/Extender: [name] Weeks in Treatment: 10 Vital Signs Time Taken: 11:25 Temperature (F): 98.2 [name], [name] ([id number]) [id number].pdf Page 2 of 2 Height (in): 72 Pulse (bpm): 104 Weight (lbs): 185 Respiratory Rate (breaths/min): 18 Body Mass Index (BMI): 25.1 Blood Pressure (mmHg): 165/80 Capillary Blood Glucose (mg/dl): 203 Reference Range: 80 - 120 mg / dl Electronic Signature(s) Signed: 12/22/2022 3:36:59 PM By: Donavan Burnet CHT EMT BS , , Previous Signature: 12/22/2022 3:11:51 PM Version By: Donavan Burnet CHT EMT BS , , Entered By: Donavan Burnet on 12/22/2022 15:36:59

## 2022-12-25 DIAGNOSIS — I10 Essential (primary) hypertension: Secondary | ICD-10-CM | POA: Diagnosis not present

## 2022-12-25 DIAGNOSIS — N185 Chronic kidney disease, stage 5: Secondary | ICD-10-CM | POA: Diagnosis not present

## 2022-12-25 DIAGNOSIS — E1122 Type 2 diabetes mellitus with diabetic chronic kidney disease: Secondary | ICD-10-CM | POA: Diagnosis not present

## 2022-12-25 DIAGNOSIS — R809 Proteinuria, unspecified: Secondary | ICD-10-CM | POA: Diagnosis not present

## 2022-12-25 DIAGNOSIS — N2581 Secondary hyperparathyroidism of renal origin: Secondary | ICD-10-CM | POA: Diagnosis not present

## 2022-12-26 ENCOUNTER — Encounter (HOSPITAL_BASED_OUTPATIENT_CLINIC_OR_DEPARTMENT_OTHER): Payer: No Typology Code available for payment source | Admitting: General Surgery

## 2022-12-26 DIAGNOSIS — E11621 Type 2 diabetes mellitus with foot ulcer: Secondary | ICD-10-CM | POA: Diagnosis not present

## 2022-12-26 DIAGNOSIS — M86671 Other chronic osteomyelitis, right ankle and foot: Secondary | ICD-10-CM | POA: Diagnosis not present

## 2022-12-26 LAB — GLUCOSE, CAPILLARY
Glucose-Capillary: 136 mg/dL — ABNORMAL HIGH (ref 70–99)
Glucose-Capillary: 145 mg/dL — ABNORMAL HIGH (ref 70–99)
Glucose-Capillary: 144 mg/dL — ABNORMAL HIGH (ref 70–99)

## 2022-12-26 NOTE — Progress Notes (Signed)
[name] ([id number]) [id number] Page 1 of 2 Visit Report for 12/26/2022 Arrival Information Details Patient Name: Date of Service: [name], [name] 12/26/2022 12:00 PM Medical Record Number: [id number] Patient Account Number: [id number] Date of Birth/Sex: Treating RN: [birth year] (72 y.o. [name], [name] Primary Care [name]: [name] Other Clinician: Valeria Batman Referring Dalynn Jhaveri: Treating Amiee Wiley/Extender: [name] Weeks in Treatment: 10 Visit Information History Since Last Visit All ordered tests and consults were completed: Yes Patient Arrived: Cane Added or deleted any medications: No Arrival Time: 11:15 Any new allergies or adverse reactions: No Accompanied By: None Had a fall or experienced change in No Transfer Assistance: None activities of daily living that may affect Patient Identification Verified: Yes risk of falls: Secondary Verification Process Completed: Yes Signs or symptoms of abuse/neglect since last visito No Patient Requires Transmission-Based Precautions: No Hospitalized since last visit: No Patient Has Alerts: No Implantable device outside of the clinic excluding No cellular tissue based products placed in the center since last visit: Pain Present Now: No Electronic Signature(s) Signed: 12/26/2022 3:11:05 PM By: Valeria Batman EMT Entered By: Valeria Batman on 12/26/2022 15:11:05 -------------------------------------------------------------------------------- Encounter Discharge Information Details Patient Name: Date of Service: [name], [name] 12/26/2022 12:00 PM Medical Record Number: [id number] Patient Account Number: [id number] Date of Birth/Sex: Treating RN: January 11, 1951 (72 y.o. [name] Primary Care [name]: [name] Other Clinician: Valeria Batman Referring [name]: Treating Latrisha Coiro/Extender: [name] Weeks in Treatment: 10 Encounter Discharge  Information Items Discharge Condition: Stable Ambulatory Status: Cane Discharge Destination: Home Transportation: Private Auto Accompanied By: None Schedule Follow-up Appointment: Yes Clinical Summary of Care: Electronic Signature(s) Signed: 12/26/2022 3:16:38 PM By: Valeria Batman EMT Entered By: Valeria Batman on 12/26/2022 15:16:38 -------------------------------------------------------------------------------- Vitals Details Patient Name: Date of Service: [name], [name] 12/26/2022 12:00 PM Medical Record Number: [id number] Patient Account Number: [id number] Date of Birth/Sex: Treating RN: [birth year] (72 y.o. [name] Primary Care [name]: [name] Other Clinician: Valeria Batman Referring [name]: Treating Mena Lienau/Extender: [name] Weeks in Treatment: 10 Vital Signs Time Taken: 11:19 Capillary Blood Glucose (mg/dl): 136 [name] ([id number]) [id number] Page 2 of 2 Height (in): 72 Reference Range: 80 - 120 mg / dl Weight (lbs): 185 Body Mass Index (BMI): 25.1 Electronic Signature(s) Signed: 12/26/2022 3:11:36 PM By: Valeria Batman EMT Entered By: Valeria Batman on 12/26/2022 15:11:36

## 2022-12-26 NOTE — Progress Notes (Signed)
DENILSON, LANGELAND ([id number]) [id number] Page 1 of 1 Visit Report for 12/12/2022 SuperBill Details Patient Name: Date of Service: [name], [name] 12/12/2022 Medical Record Number: [id number] Patient Account Number: [id number] Date of Birth/Sex: Treating RN: June 15, 1951 (72 y.o. [name], [name] Primary Care Provider: [name] Other Clinician: Donavan Burnet Referring Provider: Treating Provider/Extender: [name] Weeks in Treatment: 8 Diagnosis Coding ICD-10 Codes Code Description [phone] 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.621 Type 2 diabetes mellitus with foot ulcer N18.5 Chronic kidney disease, stage 5 M86.9 Osteomyelitis, unspecified E11.42 Type 2 diabetes mellitus with diabetic polyneuropathy I10 Essential (primary) hypertension Facility Procedures CPT4 Code Description Modifier Quantity [id number] G0277-(Facility Use Only) HBOT full body chamber, 26min , 4 ICD-10 Diagnosis Description M86.9 Osteomyelitis, unspecified 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.621 Type 2 diabetes mellitus with foot ulcer Physician Procedures Quantity CPT4 Code Description Modifier [id number] - WC PHYS HYPERBARIC OXYGEN THERAPY 1 ICD-10 Diagnosis Description M86.9 Osteomyelitis, unspecified 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

## 2022-12-26 NOTE — Progress Notes (Signed)
[place], [place] ([id number]) [id number] Page 1 of 2 Visit Report for 12/26/2022 HBO Details Patient Name: Date of Service: [name], [name] [name] 12/26/2022 12:00 PM Medical Record Number: [id number] Patient Account Number: [id number] Date of Birth/Sex: Treating RN: 11-Jun-1951 (72 y.o. 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 HBO Treatment Details Treatment Number: 35 Patient Type: Outpatient Chamber Type: Monoplace Chamber Serial #: [id number] Treatment Protocol: 2.5 ATA with 90 minutes oxygen, with two 5 minute air breaks Treatment Details Compression Rate Down: 2.0 psi / minute De-Compression Rate Up: 2.0 psi / minute A breaks and breathing ir Compress Tx Pressure periods Decompress Decompress Begins Reached (leave unused spaces Begins Ends blank) Chamber Pressure (ATA 1 2.5 2.5 2.5 2.5 2.5 - - 2.5 1 ) Clock Time (24 hr) 11:46 11:59 12:29 12:34 13:04 13:09 - - 13:39 13:50 Treatment Length: 124 (minutes) Treatment Segments: 4 Vital Signs Capillary Blood Glucose Reference Range: 80 - 120 mg / dl HBO Diabetic Blood Glucose Intervention Range: <131 mg/dl or >249 mg/dl Time Vitals Blood Respiratory Capillary Blood Glucose Pulse Action Type: Pulse: Temperature: Taken: Pressure: Rate: Glucose (mg/dl): Meter #: Oximetry (%) Taken: Pre 11:19 136 Pre 11:36 163/62 102 18 97.4 145 Treatment Response Treatment Toleration: Well Treatment Completion Status: Treatment Completed without Adverse Event Physician HBO Attestation: I certify that I supervised this HBO treatment in accordance with  Medicare guidelines. A trained emergency response team is readily available per Yes hospital policies and procedures. Continue HBOT as ordered. Yes Electronic Signature(s) Signed: 12/26/2022 4:32:23 PM By: [name] Maudlin MD FACS Previous Signature: 12/26/2022 3:15:15 PM Version By: [name] EMT Entered By: [name] on 12/26/2022 16:32:23 -------------------------------------------------------------------------------- HBO Safety Checklist Details Patient Name: Date of Service: [facility] [name], [name] 12/26/2022 12:00 PM Medical Record Number: [id number] Patient Account Number: [id number] Date of Birth/Sex: Treating RN: 12/28/1950 (72 y.o. [name] Primary Care [name]: [name] Other Clinician: Valeria [name] Referring [name]: Treating [name]/Extender: [name] Weeks in Treatment: [name] Items Safety Checklist [place], [name] ([id number]) [id number] Page 2 of 2 Consent Form Signed Patient voided / foley secured and emptied When did you last eato 1100 Last dose of injectable or oral agent Last night Ostomy pouch emptied and vented if applicable NA All implantable devices assessed, documented and approved Dialysis shunt Intravenous access site secured and place NA Valuables secured Linens and cotton and cotton/polyester blend (less than 51% polyester) Personal oil-based products / skin lotions / body lotions removed Wigs or hairpieces removed NA Smoking or tobacco materials removed Books / newspapers / magazines / loose paper removed Cologne, aftershave, perfume and deodorant removed Jewelry removed (may wrap wedding band) Make-up removed NA Hair care products removed Battery operated devices (external) removed Heating patches and chemical warmers removed Titanium eyewear removed NA Nail polish cured greater than 10 hours NA Casting material cured greater than 10 hours NA Hearing aids  removed NA Loose dentures or partials removed removed by patient Prosthetics have been removed NA Patient demonstrates correct use of air break device (if applicable) Patient concerns have been addressed Patient grounding bracelet on and cord attached to chamber Specifics for Inpatients (complete in addition to above) Medication sheet sent with patient NA Intravenous medications  needed or due during therapy sent with patient NA Drainage tubes (e.g. nasogastric tube or chest tube secured and vented) NA Endotracheal or Tracheotomy tube secured NA Cuff deflated of air and inflated with saline NA Airway suctioned NA Notes The safety checklist was done before the treatment was started. Electronic Signature(s) Signed: 12/26/2022 3:13:29 PM By: [name] EMT Entered By: [name] on 12/26/2022 15:13:29

## 2022-12-26 NOTE — Progress Notes (Signed)
[place], [name] ([id number]) [id number] Page 1 of 2 Visit Report for 12/26/2022 Problem List Details Patient Name: Date of Service: [name], [name] 12/26/2022 12:00 PM Medical Record Number: [id number] Patient Account Number: [id number] Date of Birth/Sex: Treating RN: 1951-05-25 (72 y.o. [name] Primary Care Provider: [name] Other Clinician: Valeria Batman Referring Provider: Treating Provider/Extender: [name] Weeks in Treatment: 10 Active Problems ICD-10 Encounter Code Description Active Date MDM Diagnosis L97.514 Non-pressure chronic ulcer of other part of right foot with 10/12/2022 No Yes necrosis of bone E11.621 Type 2 diabetes mellitus with foot ulcer 10/12/2022 No Yes N18.5 Chronic kidney disease, stage 5 10/12/2022 No Yes M86.9 Osteomyelitis, unspecified 10/12/2022 No Yes E11.42 Type 2 diabetes mellitus with diabetic polyneuropathy 10/12/2022 No Yes I10 Essential (primary) hypertension 10/12/2022 No Yes Inactive Problems Resolved Problems Electronic Signature(s) Signed: 12/26/2022 3:15:53 PM By: Valeria Batman EMT Signed: 12/26/2022 4:30:18 PM By: Fredirick Maudlin MD FACS Entered By: Valeria Batman on 12/26/2022 15:15:53 -------------------------------------------------------------------------------- SuperBill Details Patient Name: Date of Service: [name], [name] 12/26/2022 Medical Record Number: [id number] Patient Account Number: [id number] Date of Birth/Sex: Treating RN: December 27, 1950 (72 y.o. 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, stage 5 M86.9 Osteomyelitis, unspecified E11.42 Type 2 diabetes mellitus with diabetic polyneuropathy I10 Essential (primary) hypertension Facility Procedures : CPT4 Code Description: [id number] G0277-(Facility Use Only) HBOT full body chamber, 75min , ICD-10 Diagnosis Description M86.9 Osteomyelitis, unspecified L97.514 Non-pressure chronic ulcer of other part of right foo E11.42 Type 2 diabetes mellitus with  diabetic polyneuropathy E11.621 Type 2 diabetes mellitus with foot ulcer Modifier: t with necrosis Quantity: 4 of bone Physician Procedures : CPT4 Code Description Modifier [id number] - WC PHYS HYPERBARIC OXYGEN THERAPY ICD-10 Diagnosis Description M86.9 Osteomyelitis, unspecified L97.514 Non-pressure chronic ulcer of other part of right foot with necrosis o E11.42 Type 2 diabetes  mellitus with diabetic polyneuropathy E11.621 Type 2 diabetes mellitus with foot ulcer Quantity: 1 f bone Electronic Signature(s) Signed: 12/26/2022 3:15:48 PM By: Valeria Batman EMT Signed: 12/26/2022 4:30:18 PM By: Fredirick Maudlin MD FACS Entered By: Valeria Batman on 12/26/2022 15:15:47

## 2022-12-27 ENCOUNTER — Encounter (HOSPITAL_BASED_OUTPATIENT_CLINIC_OR_DEPARTMENT_OTHER): Payer: No Typology Code available for payment source | Admitting: General Surgery

## 2022-12-27 DIAGNOSIS — E11621 Type 2 diabetes mellitus with foot ulcer: Secondary | ICD-10-CM | POA: Diagnosis not present

## 2022-12-27 DIAGNOSIS — M86671 Other chronic osteomyelitis, right ankle and foot: Secondary | ICD-10-CM | POA: Diagnosis not present

## 2022-12-27 LAB — GLUCOSE, CAPILLARY
Glucose-Capillary: 212 mg/dL — ABNORMAL HIGH (ref 70–99)
Glucose-Capillary: 192 mg/dL — ABNORMAL HIGH (ref 70–99)

## 2022-12-27 NOTE — Progress Notes (Signed)
[place], Pace ([id number]) [id number] Page 1 of 2 Visit Report for 12/27/2022 Arrival Information Details Patient Name: Date of Service: [name], [name] 12/27/2022 12:00 PM Medical Record Number: [id number] Patient Account Number: [id number] Date of Birth/Sex: Treating RN: 05/23/1951 (72 y.o. [name] Primary Care Yonah Tangeman: [name] Other Clinician: Valeria Batman Referring Gearlene Godsil: Treating Emanuela Runnion/Extender: [name] Weeks in Treatment: 10 Visit Information History Since Last Visit All ordered tests and consults were completed: Yes Patient Arrived: Cane Added or deleted any medications: No Arrival Time: 10:52 Any new allergies or adverse reactions: No Accompanied By: None Had a fall or experienced change in No Transfer Assistance: None activities of daily living that may affect Patient Identification Verified: Yes risk of falls: Secondary Verification Process Completed: Yes Signs or symptoms of abuse/neglect since last visito No Patient Requires Transmission-Based Precautions: No Hospitalized since last visit: No Patient Has Alerts: No Implantable device outside of the clinic excluding No cellular tissue based products placed in the center since last visit: Pain Present Now: No Electronic Signature(s) Signed: 12/27/2022 3:01:33 PM By: Valeria Batman EMT Entered By: Valeria Batman on 12/27/2022 15:01:33 -------------------------------------------------------------------------------- Encounter Discharge Information Details Patient Name: Date of Service: [name], [name] 12/27/2022 12:00 PM Medical Record Number: [id number] Patient Account Number: [id number] Date of Birth/Sex: Treating RN: [birth year] (72 y.o. [name] Primary Care Loraine Freid: [name] Other Clinician: Valeria Batman Referring Coyt Govoni: Treating Carrigan Delafuente/Extender: [name] Weeks in Treatment: 10 Encounter  Discharge Information Items Discharge Condition: Stable Ambulatory Status: Cane Discharge Destination: Home Transportation: Private Auto Accompanied By: None Schedule Follow-up Appointment: Yes Clinical Summary of Care: Electronic Signature(s) Signed: 12/27/2022 3:08:01 PM By: Valeria Batman EMT Entered By: Valeria Batman on 12/27/2022 15:08:00 -------------------------------------------------------------------------------- Vitals Details Patient Name: Date of Service: [facility] [name], [name] 12/27/2022 12:00 PM Medical Record Number: [id number] Patient Account Number: [id number] Date of Birth/Sex: Treating RN: 07/23/1951 (72 y.o. [name] Primary Care Radford Pease: [name] Other Clinician: Valeria Batman Referring Ravenna Legore: Treating Jadan Rouillard/Extender: [name] Weeks in Treatment: 10 Vital Signs Time Taken: 11:01 Capillary Blood Glucose (mg/dl): 212 [name], [name] ([id number]) [phone].pdf Page 2 of 2 Height (in): 72 Reference Range: 80 - 120 mg / dl Weight (lbs): 185 Body Mass Index (BMI): 25.1 Electronic Signature(s) Signed: 12/27/2022 3:02:04 PM By: Valeria Batman EMT Entered By: Valeria Batman on 12/27/2022 15:02:04

## 2022-12-28 ENCOUNTER — Encounter (HOSPITAL_BASED_OUTPATIENT_CLINIC_OR_DEPARTMENT_OTHER): Payer: No Typology Code available for payment source | Admitting: General Surgery

## 2022-12-28 DIAGNOSIS — E11621 Type 2 diabetes mellitus with foot ulcer: Secondary | ICD-10-CM | POA: Diagnosis not present

## 2022-12-28 DIAGNOSIS — L97514 Non-pressure chronic ulcer of other 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: Secondary | ICD-10-CM | POA: Diagnosis not present

## 2022-12-28 DIAGNOSIS — M86671 Other chronic osteomyelitis, right ankle and foot: Secondary | ICD-10-CM | POA: Diagnosis not present

## 2022-12-28 LAB — GLUCOSE, CAPILLARY: Glucose-Capillary: 148 mg/dL — ABNORMAL HIGH (ref 70–99)

## 2022-12-28 NOTE — Progress Notes (Signed)
[place], [place] ([id number]) [id number] Page 1 of 2 Visit Report for 12/28/2022 HBO Details Patient Name: Date of Service: [name], [name] 12/28/2022 12:00 PM Medical Record Number: [id number] Patient Account Number: [id number] Date of Birth/Sex: Treating RN: [birth year] (72 y.o. 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 HBO Treatment Details Treatment Number: 37 Patient Type: Outpatient Chamber Type: Monoplace Chamber Serial #: [id number] Treatment Protocol: 2.5 ATA with 90 minutes oxygen, with two 5 minute air breaks Treatment Details Compression Rate Down: 2.0 psi / minute De-Compression Rate Up: 2.0 psi / minute A breaks and breathing ir Compress Tx Pressure periods Decompress Decompress Begins Reached (leave unused spaces Begins Ends blank) Chamber Pressure (ATA 1 2.5 2.5 2.5 2.5 2.5 - - 2.5 1 ) Clock Time (24 hr) 11:48 12:01 12:31 12:36 13:06 13:11 - - 13:41 13:54 Treatment Length: 126 (minutes) Treatment Segments: 4 Vital Signs Capillary Blood Glucose Reference Range: 80 - 120 mg / dl HBO Diabetic Blood Glucose Intervention Range: <131 mg/dl or >249 mg/dl Time Vitals Blood Respiratory Capillary Blood Glucose Pulse Action Type: Pulse: Temperature: Taken: Pressure: Rate: Glucose (mg/dl): Meter #: Oximetry (%) Taken: Pre 11:16 148 Post 13:58 165/72 86 16 97.5 159 Treatment Response Treatment Toleration: Well Treatment Completion Status: Treatment Completed without Adverse Event Treatment Notes The patient was seen in the [facility] before his treatment  today. Physician HBO Attestation: I certify that I supervised this HBO treatment in accordance with Medicare guidelines. A trained emergency response team is readily available per Yes hospital policies and procedures. Continue HBOT as ordered. Yes Electronic Signature(s) Signed: 12/28/2022 4:12:04 PM By: Fredirick Maudlin MD FACS Previous Signature: 12/28/2022 2:47:58 PM Version By: Valeria Batman EMT Entered By: Fredirick Maudlin on 12/28/2022 16:12:04 -------------------------------------------------------------------------------- HBO Safety Checklist Details Patient Name: Date of Service: [facility], [name] 12/28/2022 12:00 PM Medical Record Number: [id number] Patient Account Number: [id number] Date of Birth/Sex: Treating RN: 1950/10/13 (72 y.o. [name] Primary Care Javeria Briski: [name] Other Clinician: Valeria Batman Referring Cece Milhouse: Treating Emali Heyward/Extender: [name] Weeks in Treatment: [address], [name] ([id number]) [id number] Page 2 of 2 HBO Safety Checklist Items Safety Checklist Consent Form Signed Patient voided / foley secured and emptied When did you last eato 1000 Last dose of injectable or oral agent Yesterday Ostomy pouch emptied and vented if applicable NA All implantable devices assessed, documented and approved Dialysis shunt Intravenous access site secured and place NA Valuables secured Linens and cotton and cotton/polyester blend (less than 51% polyester) Personal oil-based products / skin lotions / body lotions removed Wigs or hairpieces removed NA Smoking or tobacco materials removed Books / newspapers / magazines / loose paper removed Cologne, aftershave, perfume and deodorant removed Jewelry removed (may wrap wedding band) Make-up removed NA Hair care products removed Battery operated devices (external) removed Heating patches and chemical warmers removed Titanium eyewear removed NA Nail  polish cured greater than 10 hours NA Casting material cured greater than 10 hours NA Hearing aids removed NA Loose dentures or partials removed removed by patient Prosthetics have been removed NA Patient demonstrates correct use of air break device (if applicable) Patient concerns have been addressed Patient grounding bracelet on and cord attached to chamber Specifics for  Inpatients (complete in addition to above) Medication sheet sent with patient NA Intravenous medications needed or due during therapy sent with patient NA Drainage tubes (e.g. nasogastric tube or chest tube secured and vented) NA Endotracheal or Tracheotomy tube secured NA Cuff deflated of air and inflated with saline NA Airway suctioned NA Notes The safety checklist was done before the treatment was started. Electronic Signature(s) Signed: 12/28/2022 2:45:14 PM By: Valeria Batman EMT Entered By: Valeria Batman on 12/28/2022 14:45:14

## 2022-12-28 NOTE — Progress Notes (Signed)
Laurens, E[place] ([id number]) [id number] Page 1 of 10 Visit Report for 12/28/2022 Chief Complaint Document Details Patient Name: Date of Service: [name], [name] 12/28/2022 10:30 A M Medical Record Number: [id number] Patient Account Number: [id number] Date of Birth/Sex: Treating RN: [birth year] (72 y.o. M) Primary Care Provider: [name] Other Clinician: Referring Provider: Treating Provider/Extender: [name] Weeks in Treatment: 11 Information Obtained from: Patient Chief Complaint Patients presents for treatment of an open diabetic ulcer on the right great toe Electronic Signature(s) Signed: 12/28/2022 11:48:11 AM By: Fredirick Maudlin MD FACS Entered By: Fredirick Maudlin on 12/28/2022 11:48:11 -------------------------------------------------------------------------------- Debridement Details Patient Name: Date of Service: [employer] [name], [name] 12/28/2022 10:30 A M Medical Record Number: [id number] Patient Account Number: [id number] Date of Birth/Sex: Treating RN: 04-May-1951 (72 y.o. [name] Primary Care Provider: [name] Other Clinician: Referring Provider: Treating Provider/Extender: [name] Weeks in Treatment: 11 Debridement Performed for Assessment: Wound #1 Right,Medial T Great oe Performed By: Physician Fredirick Maudlin, MD Debridement Type: Debridement Severity of Tissue Pre Debridement: Necrosis of bone Level of Consciousness (Pre-procedure): Awake and Alert Pre-procedure Verification/Time Out Yes - 11:20 Taken: Start Time: 11:20 Pain Control: Lidocaine 4% T opical Solution T Area Debrided (L x W): otal 1 (cm) x 2.5 (cm) = 2.5 (cm) Tissue and other material debrided: Non-Viable, Slough, Slough Level: Non-Viable Tissue Debridement Description: Selective/Open Wound Instrument: Curette Bleeding: Minimum Hemostasis Achieved: Pressure Procedural Pain: Insensate Post Procedural Pain:  Insensate Response to Treatment: Procedure was tolerated well Level of Consciousness (Post- Awake and Alert procedure): Post Debridement Measurements of Total Wound Length: (cm) 1 Width: (cm) 2.5 Depth: (cm) 0.4 Volume: (cm) 0.785 Character of Wound/Ulcer Post Debridement: Improved Severity of Tissue Post Debridement: Necrosis of bone Post Procedure Diagnosis Same as Pre-procedure Notes scribed for Dr. Celine Ahr by Baruch Gouty, RN Electronic Signature(s) Signed: 12/28/2022 11:51:49 AM By: Fredirick Maudlin MD FACS [place], [name] ([id number]) [id number] Page 2 of 10 Signed: 12/28/2022 3:57:42 PM By: Baruch Gouty RN, BSN Entered By: Baruch Gouty on 12/28/2022 11:21:54 -------------------------------------------------------------------------------- HPI Details Patient Name: Date of Service: [name] [name], [name] 12/28/2022 10:30 A M Medical Record Number: [id number] Patient Account Number: [id number] Date of Birth/Sex: Treating RN: 1951/03/15 (72 y.o. M) Primary Care Provider: [name] Other Clinician: Referring Provider: Treating Provider/Extender: [name] Weeks in Treatment: 11 History of Present Illness HPI Description: ADMISSION 10/12/2022 This is a 72 year old type II diabetic (last hemoglobin A1c 7.7 in October 2023) with stage V chronic kidney disease and hypertension. He has been followed by podiatry for an ulcer on his right great toe. He has a history of prior toe and foot infections requiring amputation. In November 2023, he had an outpatient MRI that demonstrated osteomyelitis. He was admitted to the hospital for IV antibiotics and subsequently underwent irrigation and debridement of the toe with biopsy and placement of married 3 layer matrix. Cultures grew out MRSA and osteomyelitis was seen on pathology. He was followed by infectious disease and given a course of doxycycline and Augmentin. He completed this course at the  end of December. He has been evaluated by vascular surgery with formal ABIs as well as consultation. He does have adequate blood flow at least to the dorsalis pedis. He last saw podiatry on January 4 of this year where he underwent debridement. It was felt he would benefit from specialized wound care and he was referred to our [name] for further evaluation and management. On the dorsal aspect of his right great  toe, there is a circular ulcer. There is a fair amount of undermining from callus and dry skin. The surface is a bit pale and fibrotic, but there is no obvious necrosis. 10/19/2022: He has been approved by his insurance to undergo hyperbaric oxygen therapy. EKG has been obtained and is normal. We are still waiting on his chest x-ray. The wound is clean with just a little bit of periwound callus and slough on the surface. Tendon is exposed. 10/26/2022: Unfortunately, the required co-pay from his insurance is cost prohibitive for him and he will not be able to receive hyperbaric oxygen therapy. Some moisture got under the dry skin around his wound and lifted it off but there is good epithelialized tissue beneath this. The exposed tendon has deteriorated. 11/03/2022: After some discussion with the financial office, it has become feasible for the patient to undergo hyperbaric oxygen therapy and he has received his first treatments this week. The wound is a little bit smaller and the tissue has a better color to it, but there is still extensive nonviable subcutaneous tissue and slough present. 11/09/2022: The wound measured a little bit smaller today. There is still slough and nonviable subcu tissue and tendon present. 11/16/2022: The wound continues to contract. There is still substantial depth to it, however, and bone remains exposed at the base. This is also a (early) 30-day update for HBO. 11/27/2022: Unfortunately, the wound has deteriorated. It appears that the tissue has become macerated. There is a  second ulcer on the lateral aspect of his great toe. The nail is lifting off of the bed and there is wet skin lifting off of the underlying tissue. The initial wound is smaller but still has a fair amount of depth to it. The patient does report quite a bit more drainage since his last visit. 12/20/2022: For scheduling reasons, somehow we have missed having a wound care visit in between his hyperbaric oxygen therapy treatments. The 2 openings of the wound have converged into 1 large site. There is necrotic tendon present at the lateral aspect of the great toe. The bone is visible, including the joint space. The area of the initial wound is flush with the surrounding skin. No malodor or purulent drainage. 12/28/2022: The wound is vastly improved this week. The 2 areas of the wound have separated due to tissue bridging and healing the space between. There is no more necrotic tissue visible, although the bone/joint space remains exposed. Electronic Signature(s) Signed: 12/28/2022 11:49:23 AM By: Fredirick Maudlin MD FACS Entered By: Fredirick Maudlin on 12/28/2022 11:49:23 -------------------------------------------------------------------------------- Physical Exam Details Patient Name: Date of Service: [facility] [name], [name] 12/28/2022 10:30 A M Medical Record Number: [id number] Patient Account Number: [id number] Date of Birth/Sex: Treating RN: [birth year] (72 y.o. M) Primary Care Provider: [name] Other Clinician: Referring Provider: Treating Provider/Extender: [name] Weeks in Treatment: 11 Constitutional Hypertensive, asymptomatic. Slightly tachycardic. . . no acute distress. Respiratory Normal work of breathing on room air. [place], Brandom ([id number]) [id number] Page 3 of 10 Notes 12/28/2022: The wound is vastly improved this week. The 2 areas of the wound have separated due to tissue bridging and healing the space between. There is no more  necrotic tissue visible, although the bone/joint space remains exposed. Electronic Signature(s) Signed: 12/28/2022 11:50:04 AM By: Fredirick Maudlin MD FACS Entered By: Fredirick Maudlin on 12/28/2022 11:50:04 -------------------------------------------------------------------------------- Physician Orders Details Patient Name: Date of Service: [name], [name] 12/28/2022 10:30 A M Medical Record Number: [id number] Patient Account Number: [id number]  Date of Birth/Sex: Treating RN: 1951/08/23 (72 y.o. [name] Primary Care Provider: [name] Other Clinician: Referring Provider: Treating Provider/Extender: [name] Weeks in Treatment: 11 Verbal / Phone Orders: No Diagnosis Coding ICD-10 Coding Code Description L97.514 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.621 Type 2 diabetes mellitus with foot ulcer N18.5 Chronic kidney disease, stage 5 M86.9 Osteomyelitis, unspecified E11.42 Type 2 diabetes mellitus with diabetic polyneuropathy I10 Essential (primary) hypertension Follow-up Appointments ppointment in 1 week. - Dr. Celine Ahr Rm 1 Return A Thursday 4/4 @ 10:30 am Anesthetic Wound #1 Right,Medial T Great oe (In clinic) Topical Lidocaine 4% applied to wound bed Cellular or Tissue Based Products Cellular or Tissue Based Product Type: - run IVR for [employer]ord and [employer] May shower and wash wound with soap and water. Off-Loading Wound #1 Right,Medial T Great oe Other: - Diabetic shoe Hyperbaric Oxygen Therapy Evaluate for HBO Therapy Indication: - chronic refractory osteomyelitis If appropriate for treatment, begin HBOT per protocol: 2.5 ATA for 90 Minutes with 2 Five (5) Minute A Breaks ir Total Number of Treatments: - 40 plus 40 (12/20/22) One treatments per day (delivered Monday through Friday unless otherwise specified in Special Instructions below): Finger stick Blood Glucose Pre-  and Post- HBOT Treatment. Follow Hyperbaric Oxygen Glycemia Protocol Give two 4oz orange juices in addition to Glucerna when the glycemic protocol is used. A frin (Oxymetazoline HCL) 0.05% nasal spray - 1 spray in both nostrils daily as needed prior to HBO treatment for difficulty clearing ears Wound Treatment Wound #1 - T Great oe Wound Laterality: Right, Medial Cleanser: Soap and Water 1 x Per Day/30 Days Discharge Instructions: May shower and wash wound with dial antibacterial soap and water prior to dressing change. Cleanser: Wound Cleanser 1 x Per Day/30 Days Discharge Instructions: Cleanse the wound with wound cleanser prior to applying a clean dressing using gauze sponges, not tissue or cotton balls. Peri-Wound Care: Zinc Oxide Ointment 30g tube 1 x Per Day/30 Days Discharge Instructions: Apply Zinc Oxide to periwound with each dressing change as needed Topical: Gentamicin 1 x Per Day/30 Days Discharge Instructions: As directed by physician [place], [name] ([id number]) [id number] Page 4 of 10 Prim Dressing: Hydrofera Blue Ready Transfer Foam, 2.5x2.5 (in/in) (Generic) 1 x Per Day/30 Days ary Discharge Instructions: Apply directly to wound bed as directed Secondary Dressing: Woven Gauze Sponge, Non-Sterile 4x4 in 1 x Per Day/30 Days Discharge Instructions: Apply over primary dressing as directed. Secured With: Child psychotherapist, Sterile 2x75 (in/in) 1 x Per Day/30 Days Discharge Instructions: Secure with stretch gauze as directed. Secured With: 52M Medipore Public affairs consultant Surgical T 2x10 (in/yd) 1 x Per Day/30 Days ape Discharge Instructions: Secure with tape as directed. GLYCEMIA INTERVENTIONS PROTOCOL PRE-HBO GLYCEMIA INTERVENTIONS ACTION INTERVENTION Obtain pre-HBO capillary blood glucose (ensure 1 physician order is in chart). A. Notify HBO physician and await physician orders. 2 If result is 70 mg/dl or below: B. If the result meets  the hospital definition of a critical result, follow hospital policy. A. Give patient an 8 ounce Glucerna Shake, an 8 ounce Ensure, or 8 ounces of a Glucerna/Ensure equivalent dietary supplement*. B. Wait 30 minutes. If result is 71 mg/dl to 130 mg/dl: C. Retest patients capillary blood glucose (CBG). D. If result greater than or equal to 110 mg/dl, proceed with HBO. If result less than 110 mg/dl, notify HBO physician and consider holding HBO. If result is 131 mg/dl to 249  mg/dl: A. Proceed with HBO. A. Notify HBO physician and await physician orders. B. It is recommended to hold HBO and do If result is 250 mg/dl or greater: blood/urine ketone testing. C. If the result meets the hospital definition of a critical result, follow hospital policy. POST-HBO GLYCEMIA INTERVENTIONS ACTION INTERVENTION Obtain post HBO capillary blood glucose (ensure 1 physician order is in chart). A. Notify HBO physician and await physician orders. 2 If result is 70 mg/dl or below: B. If the result meets the hospital definition of a critical result, follow hospital policy. A. Give patient an 8 ounce Glucerna Shake, an 8 ounce Ensure, or 8 ounces of a Glucerna/Ensure equivalent dietary supplement*. B. Wait 15 minutes for symptoms of If result is 71 mg/dl to 100 mg/dl: hypoglycemia (i.e. nervousness, anxiety, sweating, chills, clamminess, irritability, confusion, tachycardia or dizziness). C. If patient asymptomatic, discharge patient. If patient symptomatic, repeat capillary blood glucose (CBG) and notify HBO physician. If result is 101 mg/dl to 249 mg/dl: A. Discharge patient. A. Notify HBO physician and await physician orders. B. It is recommended to do blood/urine ketone If result is 250 mg/dl or greater: testing. C. If the result meets the hospital definition of a critical result, follow hospital policy. *Juice or candies are NOT equivalent products. If patient refuses the Glucerna  or Ensure, please consult the hospital dietitian for an appropriate substitute. Electronic Signature(s) Signed: 12/28/2022 11:51:49 AM By: Fredirick Maudlin MD FACS Entered By: Fredirick Maudlin on 12/28/2022 11:50:16 -------------------------------------------------------------------------------- Problem List Details Patient Name: Date of Service: [facility], [name] 12/28/2022 10:30 A M Medical Record Number: [id number] Patient Account Number: [id number] Date of Birth/Sex: Treating RN: 12-07-1950 (72 y.o. [name] [place], [name] ([id number]) [id number] Page 5 of 10 Primary Care Provider: [name] Other Clinician: Referring Provider: Treating Provider/Extender: [name] Weeks in Treatment: 11 Active Problems ICD-10 Encounter Code Description Active Date MDM Diagnosis L97.514 Non-pressure chronic ulcer of other 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 10/12/2022 No Yes E11.621 Type 2 diabetes mellitus with foot ulcer 10/12/2022 No Yes N18.5 Chronic kidney disease, stage 5 10/12/2022 No Yes M86.9 Osteomyelitis, unspecified 10/12/2022 No Yes E11.42 Type 2 diabetes mellitus with diabetic polyneuropathy 10/12/2022 No Yes I10 Essential (primary) hypertension 10/12/2022 No Yes Inactive Problems Resolved Problems Electronic Signature(s) Signed: 12/28/2022 11:45:07 AM By: Fredirick Maudlin MD FACS Entered By: Fredirick Maudlin on 12/28/2022 11:45:07 -------------------------------------------------------------------------------- Progress Note Details Patient Name: Date of Service: [facility] [name] [name], [name] 12/28/2022 10:30 A M Medical Record Number: [id number] Patient Account Number: [id number] Date of Birth/Sex: Treating RN: [birth year] (72 y.o. M) Primary Care Provider: [name] Other Clinician: Referring Provider: Treating Provider/Extender: [name] Weeks in Treatment: 11 Subjective Chief Complaint Information obtained  from Patient Patients presents for treatment of an open diabetic ulcer on the right great toe History of Present Illness (HPI) ADMISSION 10/12/2022 This is a 72 year old type II diabetic (last hemoglobin A1c 7.7 in October 2023) with stage V chronic kidney disease and hypertension. He has been followed by podiatry for an ulcer on his right great toe. He has a history of prior toe and foot infections requiring amputation. In November 2023, he had an outpatient MRI that demonstrated osteomyelitis. He was admitted to the hospital for IV antibiotics and subsequently underwent irrigation and debridement of the toe with biopsy and placement of married 3 layer matrix. Cultures grew out MRSA and osteomyelitis was seen on pathology. He was followed by infectious disease and given a course of doxycycline  and Augmentin. He completed this course at the end of December. He has been evaluated by vascular surgery with formal ABIs as well as consultation. He does have adequate blood flow at least to the dorsalis pedis. He last saw podiatry on January 4 of this year where he underwent debridement. It was felt he would benefit from specialized wound care and he was referred to our [name] for further evaluation and management. On the dorsal aspect of his right great toe, there is a circular ulcer. There is a fair amount of undermining from callus and dry skin. The surface is a bit pale and fibrotic, but there is no obvious necrosis. 10/19/2022: He has been approved by his insurance to undergo hyperbaric oxygen therapy. EKG has been obtained and is normal. We are still waiting on his chest x-ray. The wound is clean with just a little bit of periwound callus and slough on the surface. Tendon is exposed. [name], [name] ([id number]) [id number] Page 6 of 10 10/26/2022: Unfortunately, the required co-pay from his insurance is cost prohibitive for him and he will not be able to receive hyperbaric  oxygen therapy. Some moisture got under the dry skin around his wound and lifted it off but there is good epithelialized tissue beneath this. The exposed tendon has deteriorated. 11/03/2022: After some discussion with the financial office, it has become feasible for the patient to undergo hyperbaric oxygen therapy and he has received his first treatments this week. The wound is a little bit smaller and the tissue has a better color to it, but there is still extensive nonviable subcutaneous tissue and slough present. 11/09/2022: The wound measured a little bit smaller today. There is still slough and nonviable subcu tissue and tendon present. 11/16/2022: The wound continues to contract. There is still substantial depth to it, however, and bone remains exposed at the base. This is also a (early) 30-day update for HBO. 11/27/2022: Unfortunately, the wound has deteriorated. It appears that the tissue has become macerated. There is a second ulcer on the lateral aspect of his great toe. The nail is lifting off of the bed and there is wet skin lifting off of the underlying tissue. The initial wound is smaller but still has a fair amount of depth to it. The patient does report quite a bit more drainage since his last visit. 12/20/2022: For scheduling reasons, somehow we have missed having a wound care visit in between his hyperbaric oxygen therapy treatments. The 2 openings of the wound have converged into 1 large site. There is necrotic tendon present at the lateral aspect of the great toe. The bone is visible, including the joint space. The area of the initial wound is flush with the surrounding skin. No malodor or purulent drainage. 12/28/2022: The wound is vastly improved this week. The 2 areas of the wound have separated due to tissue bridging and healing the space between. There is no more necrotic tissue visible, although the bone/joint space remains exposed. Patient History Information obtained from  Patient. Family History Diabetes - Mother,Siblings, Heart Disease - Siblings, Hypertension - Siblings, Seizures - Father, Stroke - Siblings, No family history of Cancer, Hereditary Spherocytosis, Kidney Disease, Lung Disease, Thyroid Problems, Tuberculosis. Social History Former smoker, Marital Status - Married, Alcohol Use - Rarely, Drug Use - No History, Caffeine Use - Daily. Medical History Eyes Patient has history of Cataracts - rt eye Ear/Nose/Mouth/Throat Denies history of Chronic sinus problems/congestion Hematologic/Lymphatic Denies history of Anemia, Hemophilia, Human Immunodeficiency Virus, Lymphedema, Sickle Cell Disease  Respiratory Denies history of Aspiration, Asthma, Chronic Obstructive Pulmonary Disease (COPD), Pneumothorax, Tuberculosis Cardiovascular Patient has history of Hypertension Gastrointestinal Denies history of Cirrhosis , Colitis, Crohnoos, Hepatitis A, Hepatitis B, Hepatitis C Endocrine Patient has history of Type II Diabetes Genitourinary Denies history of End Stage Renal Disease Immunological Denies history of Lupus Erythematosus, Raynaudoos, Scleroderma Integumentary (Skin) Denies history of History of Burn Musculoskeletal Patient has history of Osteomyelitis - MRI confirmed (08/16/22) Denies history of Gout Neurologic Patient has history of Neuropathy Psychiatric Patient has history of Confinement Anxiety Denies history of Anorexia/bulimia Hospitalization/Surgery History - AV fistula place left upper arm 10/20/22. Medical A Surgical History Notes nd Genitourinary CKD IV Objective Constitutional Hypertensive, asymptomatic. Slightly tachycardic. no acute distress. Vitals Time Taken: 10:59 AM, Height: 72 in, Source: Stated, Weight: 185 lbs, Source: Stated, BMI: 25.1, Temperature: 98 F, Pulse: 103 bpm, Respiratory Rate: 18 breaths/min, Blood Pressure: 194/86 mmHg, Capillary Blood Glucose: 148 mg/dl. Respiratory [name], [name] ([id number])  [id number] Page 7 of 10 Normal work of breathing on room air. General Notes: 12/28/2022: The wound is vastly improved this week. The 2 areas of the wound have separated due to tissue bridging and healing the space between. There is no more necrotic tissue visible, although the bone/joint space remains exposed. Integumentary (Hair, Skin) Wound #1 status is Open. Original cause of wound was Pressure Injury. The date acquired was: 08/16/2022. The wound has been in treatment 11 weeks. The wound is located on the Right,Medial T Great. The wound measures 1cm length x 2.5cm width x 0.4cm depth; 1.963cm^2 area and 0.785cm^3 volume. There oe is bone, tendon, and Fat Layer (Subcutaneous Tissue) exposed. There is no tunneling or undermining noted. There is a medium amount of serosanguineous drainage noted. The wound margin is distinct with the outline attached to the wound base. There is medium (34-66%) pink granulation within the wound bed. There is a medium (34-66%) amount of necrotic tissue within the wound bed including Adherent Slough. The periwound skin appearance had no abnormalities noted for texture. The periwound skin appearance had no abnormalities noted for moisture. The periwound skin appearance had no abnormalities noted for color. Periwound temperature was noted as No Abnormality. Assessment Active Problems ICD-10 Non-pressure chronic ulcer of other 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 Type 2 diabetes mellitus with foot ulcer Chronic kidney disease, stage 5 Osteomyelitis, unspecified Type 2 diabetes mellitus with diabetic polyneuropathy Essential (primary) hypertension Procedures Wound #1 Pre-procedure diagnosis of Wound #1 is a Diabetic Wound/Ulcer of the Lower Extremity located on the Right,Medial T Great .Severity of Tissue Pre oe Debridement is: Necrosis of bone. There was a Selective/Open Wound Non-Viable Tissue Debridement with a total area of 2.5  sq cm performed by Fredirick Maudlin, MD. With the following instrument(s): Curette to remove Non-Viable tissue/material. Material removed includes [name] after achieving pain control using Lidocaine 4% T opical Solution. No specimens were taken. A time out was conducted at 11:20, prior to the start of the procedure. A Minimum amount of bleeding was controlled with Pressure. The procedure was tolerated well with a pain level of Insensate throughout and a pain level of Insensate following the procedure. Post Debridement Measurements: 1cm length x 2.5cm width x 0.4cm depth; 0.785cm^3 volume. Character of Wound/Ulcer Post Debridement is improved. Severity of Tissue Post Debridement is: Necrosis of bone. Post procedure Diagnosis Wound #1: Same as Pre-Procedure General Notes: scribed for Dr. Celine Ahr by Baruch Gouty, RN. Plan Follow-up Appointments: Return Appointment in 1 week. - Dr. Celine Ahr Rm 1 Thursday  4/4 @ 10:30 am Anesthetic: Wound #1 Right,Medial T Great: oe (In clinic) Topical Lidocaine 4% applied to wound bed Cellular or Tissue Based Products: Cellular or Tissue Based Product Type: - run IVR for epicord and keresis Midwife Hygiene: May shower and wash wound with soap and water. Off-Loading: Wound #1 Right,Medial T Great: oe Other: - Diabetic shoe Hyperbaric Oxygen Therapy: Evaluate for HBO Therapy Indication: - chronic refractory osteomyelitis If appropriate for treatment, begin HBOT per protocol: 2.5 ATA for 90 Minutes with 2 Five (5) Minute Air Breaks T Number of Treatments: - 40 plus 40 (12/20/22) otal One treatments per day (delivered Monday through Friday unless otherwise specified in Special Instructions below): Finger stick Blood Glucose Pre- and Post- HBOT Treatment. Follow Hyperbaric Oxygen Glycemia Protocol Give two 4oz orange juices in addition to Glucerna when the glycemic protocol is used. Afrin (Oxymetazoline HCL) 0.05% nasal spray - 1 spray in both nostrils  daily as needed prior to HBO treatment for difficulty clearing ears WOUND #1: - T Great Wound Laterality: Right, Medial oe Cleanser: Soap and Water 1 x Per Day/30 Days Discharge Instructions: May shower and wash wound with dial antibacterial soap and water prior to dressing change. Cleanser: Wound Cleanser 1 x Per Day/30 Days Discharge Instructions: Cleanse the wound with wound cleanser prior to applying a clean dressing using gauze sponges, not tissue or cotton balls. Peri-Wound Care: Zinc Oxide Ointment 30g tube 1 x Per Day/30 Days Discharge Instructions: Apply Zinc Oxide to periwound with each dressing change as needed Topical: Gentamicin 1 x Per Day/30 Days Discharge Instructions: As directed by physician Prim Dressing: Hydrofera Blue Ready Transfer Foam, 2.5x2.5 (in/in) (Generic) 1 x Per Day/30 Days ary Discharge Instructions: Apply directly to wound bed as directed [facility] [place] [place] ([id number]) [id number] Page 8 of 10 Secondary Dressing: Woven Gauze Sponge, Non-Sterile 4x4 in 1 x Per Day/30 Days Discharge Instructions: Apply over primary dressing as directed. Secured With: Child psychotherapist, Sterile 2x75 (in/in) 1 x Per Day/30 Days Discharge Instructions: Secure with stretch gauze as directed. Secured With: 76M [employer] Public affairs consultant Surgical T 2x10 (in/yd) 1 x Per Day/30 Days ape Discharge Instructions: Secure with tape as directed. 12/28/2022: The wound is vastly improved this week. The 2 areas of the wound have separated due to tissue bridging and healing the space between. There is no more necrotic tissue visible, although the bone/joint space remains exposed. I used a curette to debride slough from the wounds. We will continue topical gentamicin with Hydrofera Blue. We are going to run his insurance for [employer]. He will continue his hyperbaric oxygen therapy. Follow-up in 1 week. Electronic Signature(s) Signed: 12/28/2022 11:50:54  AM By: Fredirick Maudlin MD FACS Entered By: Fredirick Maudlin on 12/28/2022 11:50:53 -------------------------------------------------------------------------------- HxROS Details Patient Name: Date of Service: [facility] [name], [name] 12/28/2022 10:30 A M Medical Record Number: [id number] Patient Account Number: [id number] Date of Birth/Sex: Treating RN: 24-Jun-1951 (72 y.o. M) Primary Care Provider: [name] Other Clinician: Referring Provider: Treating Provider/Extender: [name] Weeks in Treatment: 11 Information Obtained From Patient Eyes Medical History: Positive for: Cataracts - rt eye Ear/Nose/Mouth/Throat Medical History: Negative for: Chronic sinus problems/congestion Hematologic/Lymphatic Medical History: Negative for: Anemia; Hemophilia; Human Immunodeficiency Virus; Lymphedema; Sickle Cell Disease Respiratory Medical History: Negative for: Aspiration; Asthma; Chronic Obstructive Pulmonary Disease (COPD); Pneumothorax; Tuberculosis Cardiovascular Medical History: Positive for: Hypertension Gastrointestinal Medical History: Negative for: Cirrhosis ; Colitis; Crohns; Hepatitis A; Hepatitis B; Hepatitis C Endocrine Medical History: Positive for: Type II  Diabetes Time with diabetes: 2000 Treated with: Insulin Blood sugar tested every day: Yes Tested : Genitourinary Medical HistoryALFONZO, [name] ([id number]) [id number] Page 9 of 10 Negative for: End Stage Renal Disease Past Medical History Notes: CKD IV Immunological Medical History: Negative for: Lupus Erythematosus; Raynauds; Scleroderma Integumentary (Skin) Medical History: Negative for: History of Burn Musculoskeletal Medical History: Positive for: Osteomyelitis - MRI confirmed (08/16/22) Negative for: Gout Neurologic Medical History: Positive for: Neuropathy Psychiatric Medical History: Positive for: Confinement Anxiety Negative for:  Anorexia/bulimia HBO Extended History Items Eyes: Cataracts Immunizations Pneumococcal Vaccine: Received Pneumococcal Vaccination: Yes Received Pneumococcal Vaccination On or After 60th Birthday: Yes Implantable Devices None Hospitalization / Surgery History Type of Hospitalization/Surgery AV fistula place left upper arm 10/20/22 Family and Social History Cancer: No; Diabetes: Yes - Mother,Siblings; Heart Disease: Yes - Siblings; Hereditary Spherocytosis: No; Hypertension: Yes - Siblings; Kidney Disease: No; Lung Disease: No; Seizures: Yes - Father; Stroke: Yes - Siblings; Thyroid Problems: No; Tuberculosis: No; Former smoker; Marital Status - Married; Alcohol Use: Rarely; Drug Use: No History; Caffeine Use: Daily; Financial Concerns: No; Food, Clothing or Shelter Needs: No; Support System Lacking: No; Transportation Concerns: No Electronic Signature(s) Signed: 12/28/2022 11:51:49 AM By: Fredirick Maudlin MD FACS Entered By: Fredirick Maudlin on 12/28/2022 11:49:36 -------------------------------------------------------------------------------- SuperBill Details Patient Name: Date of Service: [facility], [name] 12/28/2022 Medical Record Number: [id number] Patient Account Number: [id number] Date of Birth/Sex: Treating RN: April 17, 1951 (72 y.o. M) Primary Care Provider: [name] Other Clinician: Referring Provider: Treating Provider/Extender: [name] Weeks in Treatment: 11 Diagnosis Coding ICD-10 Codes Code Description ALLYN, VANDERVEER ([id number]) [id number] Page 10 of 10 L97.514 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.621 Type 2 diabetes mellitus with foot ulcer N18.5 Chronic kidney disease, stage 5 M86.9 Osteomyelitis, unspecified E11.42 Type 2 diabetes mellitus with diabetic polyneuropathy I10 Essential (primary) hypertension Facility Procedures : CPT4 Code: [id number] Description: [id number] -  DEBRIDE WOUND 1ST 20 SQ CM OR < ICD-10 Diagnosis Description L97.514 Non-pressure chronic ulcer of other 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 Modifier: Quantity: 1 Physician Procedures : CPT4 Code Description Modifier [id number] - WC PHYS LEVEL 4 - EST PT 25 ICD-10 Diagnosis Description L97.514 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.621 Type 2 diabetes mellitus with foot ulcer N18.5 Chronic  kidney disease, stage 5 M86.9 Osteomyelitis, unspecified Quantity: 1 : [id number] - WC PHYS DEBR WO ANESTH 20 SQ CM ICD-10 Diagnosis Description L97.514 Non-pressure chronic ulcer of other 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 Quantity: 1 Electronic Signature(s) Signed: 12/28/2022 11:51:11 AM By: Fredirick Maudlin MD FACS Entered By: Fredirick Maudlin on 12/28/2022 11:51:11

## 2022-12-28 NOTE — Progress Notes (Signed)
[place], [place] ([id number]) [id number] Page 1 of 8 Visit Report for 12/28/2022 Arrival Information Details Patient Name: Date of Service: [name], [name] 12/28/2022 10:30 A M Medical Record Number: [id number] Patient Account Number: [id number] Date of Birth/Sex: Treating RN: Feb 17, 1951 (72 y.o. [name], [name] Primary Care Lesly Pontarelli: [name] Other Clinician: Referring Angely Dietz: Treating [name]/Extender: [name] Weeks in Treatment: 11 Visit Information History Since Last Visit Added or deleted any medications: No Patient Arrived: Ambulatory Any new allergies or adverse reactions: No Arrival Time: 10:55 Had a fall or experienced change in No Accompanied By: self activities of daily living that may affect Transfer Assistance: None risk of falls: Patient Identification Verified: Yes Signs or symptoms of abuse/neglect since last visito No Secondary Verification Process Completed: Yes Hospitalized since last visit: No Patient Requires Transmission-Based Precautions: No Implantable device outside of the clinic excluding No Patient Has Alerts: No cellular tissue based products placed in the center since last visit: Has Dressing in Place as Prescribed: Yes Pain Present Now: No Electronic Signature(s) Signed: 12/28/2022 3:57:42 PM By: Baruch Gouty RN, BSN Entered By: Baruch Gouty on 12/28/2022 10:57:28 -------------------------------------------------------------------------------- Encounter Discharge Information Details Patient Name: Date of Service: [name], [name] 12/28/2022 10:30 A M Medical Record Number: [id number] Patient Account Number: [id number] Date of Birth/Sex: Treating RN: [birth year] (72 y.o. [name] Primary Care Shakeria Robinette: [name] Other Clinician: Referring [name]: Treating Conlee Sliter/Extender: [name] Weeks in Treatment: 11 Encounter Discharge Information Items Post  Procedure Vitals Discharge Condition: Stable Temperature (F): 98 Ambulatory Status: Ambulatory Pulse (bpm): 103 Discharge Destination: Home Respiratory Rate (breaths/min): 18 Transportation: Private Auto Blood Pressure (mmHg): 194/86 Accompanied By: self Schedule Follow-up Appointment: Yes Clinical Summary of Care: Patient Declined Electronic Signature(s) Signed: 12/28/2022 3:57:42 PM By: Baruch Gouty RN, BSN Entered By: Baruch Gouty on 12/28/2022 11:32:27 -------------------------------------------------------------------------------- Lower Extremity Assessment Details Patient Name: Date of Service: [facility], [name] 12/28/2022 10:30 A M Medical Record Number: [id number] Patient Account Number: [id number] Date of Birth/Sex: Treating RN: [birth year] (72 y.o. [name] Primary Care Anise Harbin: [name] Other Clinician: Referring Keerthana Vanrossum: Treating Aleric Froelich/Extender: [name] Weeks in Treatment: 11 Edema Assessment Assessed: [Left: No] [Right: No] T[LeftNICHOLUS, FERGUS ([id number] [[id number] Page 2 of 8] Edema: [Left: N] [Right: o] Calf Left: Right: Point of Measurement: From Medial Instep 36 cm Ankle Left: Right: Point of Measurement: From Medial Instep 22 cm Vascular Assessment Pulses: Dorsalis Pedis Palpable: [Right:Yes] Electronic Signature(s) Signed: 12/28/2022 3:57:42 PM By: Baruch Gouty RN, BSN Entered By: Baruch Gouty on 12/28/2022 11:03:19 -------------------------------------------------------------------------------- Multi Wound Chart Details Patient Name: Date of Service: [name], [name] 12/28/2022 10:30 A M Medical Record Number: [id number] Patient Account Number: [id number] Date of Birth/Sex: Treating RN: 06/01/1951 (72 y.o. M) Primary Care Bernardette Waldron: [name] Other Clinician: Referring Abrar Bilton: Treating [name]/Extender: [name] Weeks in Treatment:  11 Vital Signs Height(in): 72 Capillary Blood Glucose(mg/dl): 148 Weight(lbs): 185 Pulse(bpm): 103 Body Mass Index(BMI): 25.1 Blood Pressure(mmHg): 194/86 Temperature(F): 98 Respiratory Rate(breaths/min): 18 [1:Photos:] [N/A:N/A] Right, Medial T Great oe N/A N/A Wound Location: Pressure Injury N/A N/A Wounding Event: Diabetic Wound/Ulcer of the Lower N/A N/A Primary Etiology: Extremity Acute Osteomyelitis N/A N/A Secondary Etiology: Cataracts, Hypertension, Type II N/A N/A Comorbid History: Diabetes, Osteomyelitis, Neuropathy, Confinement Anxiety 08/16/2022 N/A N/A Date Acquired: 11 N/A N/A Weeks of Treatment: Open N/A N/A Wound Status: No N/A N/A Wound Recurrence: 1x2.5x0.4 N/A N/A Measurements L x W x D (cm) 1.963 N/A N/A  A (cm) : rea 0.785 N/A N/A Volume (cm) : -613.80% N/A N/A % Reduction in A rea: -613.60% N/A N/A % Reduction in Volume: Grade 3 N/A N/A Classification: Medium N/A N/A Exudate A mount: Serosanguineous N/A N/A Exudate Type: red, brown N/A N/A Exudate Color: Distinct, outline attached N/A N/A Wound Margin: Medium (34-66%) N/A N/A Granulation A mount: Pink N/A N/A Granulation Quality: Medium (34-66%) N/A N/A Necrotic A mountHalford Spayd, Lovelace ([id number] Page 3 of 8 Fat Layer (Subcutaneous Tissue): Yes N/A N/A Exposed Structures: Tendon: Yes Bone: Yes Fascia: No Muscle: No Joint: No Small (1-33%) N/A N/A Epithelialization: Debridement - Selective/Open Wound N/A N/A Debridement: Pre-procedure Verification/Time Out 11:20 N/A N/A Taken: Lidocaine 4% Topical Solution N/A N/A Pain Control: Slough N/A N/A Tissue Debrided: Non-Viable Tissue N/A N/A Level: 2.5 N/A N/A Debridement A (sq cm): rea Curette N/A N/A Instrument: Minimum N/A N/A Bleeding: Pressure N/A N/A Hemostasis A chieved: Insensate N/A N/A Procedural Pain: Insensate N/A N/A Post Procedural Pain: Procedure was  tolerated well N/A N/A Debridement Treatment Response: 1x2.5x0.4 N/A N/A Post Debridement Measurements L x W x D (cm) 0.785 N/A N/A Post Debridement Volume: (cm) Callus: Yes N/A N/A Periwound Skin Texture: Maceration: Yes N/A N/A Periwound Skin Moisture: No Abnormalities Noted N/A N/A Periwound Skin Color: No Abnormality N/A N/A Temperature: Debridement N/A N/A Procedures Performed: Treatment Notes Wound #1 (Toe Great) Wound Laterality: Right, Medial Cleanser Soap and Water Discharge Instruction: May shower and wash wound with dial antibacterial soap and water prior to dressing change. Wound Cleanser Discharge Instruction: Cleanse the wound with wound cleanser prior to applying a clean dressing using gauze sponges, not tissue or cotton balls. Peri-Wound Care Zinc Oxide Ointment 30g tube Discharge Instruction: Apply Zinc Oxide to periwound with each dressing change as needed Topical Gentamicin Discharge Instruction: As directed by physician Primary Dressing Hydrofera Blue Ready Transfer Foam, 2.5x2.5 (in/in) Discharge Instruction: Apply directly to wound bed as directed Secondary Dressing Woven Gauze Sponge, Non-Sterile 4x4 in Discharge Instruction: Apply over primary dressing as directed. Secured With Conforming Stretch Gauze Bandage, Sterile 2x75 (in/in) Discharge Instruction: Secure with stretch gauze as directed. 22M Medipore Soft Cloth Surgical T 2x10 (in/yd) ape Discharge Instruction: Secure with tape as directed. Compression Wrap Compression Stockings Add-Ons Electronic Signature(s) Signed: 12/28/2022 11:48:01 AM By: Fredirick Maudlin MD FACS Entered By: Fredirick Maudlin on 12/28/2022 11:48:01 Multi-Disciplinary Care Plan Details -------------------------------------------------------------------------------- [name] ([id number]) [id number] Page 4 of 8 Patient Name: Date of ServiceJodene [name], [name] 12/28/2022 10:30 A M Medical  Record Number: [id number] Patient Account Number: [id number] Date of Birth/Sex: Treating RN: January 11, 1951 (72 y.o. [name] Primary Care Aadvik Roker: [name] Other Clinician: Referring Cherry Wittwer: Treating Latandra Loureiro/Extender: [name] Weeks in Treatment: 11 Multidisciplinary Care Plan reviewed with physician Active Inactive HBO Nursing Diagnoses: Potential for barotraumas to ears, sinuses, teeth, and lungs or cerebral gas embolism related to changes in atmospheric pressure inside hyperbaric oxygen chamber Potential for oxygen toxicity seizures related to delivery of 100% oxygen at an increased atmospheric pressure Potential for pulmonary oxygen toxicity related to delivery of 100% oxygen at an increased atmospheric pressure Goals: Barotrauma will be prevented during HBO2 Date Initiated: 12/28/2022 T arget Resolution Date: 01/25/2023 Goal Status: Active Patient and/or family will be able to state/discuss factors appropriate to the management of their disease process during treatment Date Initiated: 12/28/2022 T arget Resolution Date: 01/25/2023 Goal Status: Active Patient will tolerate the hyperbaric oxygen therapy treatment Date Initiated: 12/28/2022 T arget Resolution Date: 01/25/2023 Goal  Status: Active Patient will tolerate the internal climate of the chamber Date Initiated: 12/28/2022 T arget Resolution Date: 01/25/2023 Goal Status: Active Interventions: Administer the correct therapeutic gas delivery based on the patients needs and limitations, per physician order Assess and provide for patients comfort related to the hyperbaric environment and equalization of middle ear Assess for signs and symptoms related to adverse events, including but not limited to confinement anxiety, pneumothorax, oxygen toxicity and baurotrauma Assess patient for any history of confinement anxiety Assess patient's knowledge and expectations regarding hyperbaric medicine and provide  education related to the hyperbaric environment, goals of treatment and prevention of adverse events Implement protocols to decrease risk of pneumothorax in high risk patients Notes: Nutrition Nursing Diagnoses: Impaired glucose control: actual or potential Goals: Patient/caregiver will maintain therapeutic glucose control Date Initiated: 10/12/2022 Target Resolution Date: 01/25/2023 Goal Status: Active Interventions: Provide education on elevated blood sugars and impact on wound healing Provide education on nutrition Treatment Activities: Dietary management education, guidance and counseling : 10/12/2022 Giving encouragement to exercise : 10/12/2022 Notes: Osteomyelitis Nursing Diagnoses: Infection: osteomyelitis Goals: Patient/caregiver will verbalize understanding of disease process and disease management Date Initiated: 10/12/2022 Target Resolution Date: 01/25/2023 Goal Status: Active Interventions: Assess for signs and symptoms of osteomyelitis resolution every visit Sliger, [place] ([id number]) [id number] Page 5 of 8 Provide education on osteomyelitis Treatment Activities: MRI : 08/16/2022 Notes: Wound/Skin Impairment Nursing Diagnoses: Impaired tissue integrity Goals: Ulcer/skin breakdown will have a volume reduction of 30% by week 4 Date Initiated: 10/12/2022 Target Resolution Date: 01/25/2023 Goal Status: Active Interventions: Assess ulceration(s) every visit Provide education on smoking Treatment Activities: Skin care regimen initiated : 10/12/2022 Smoking cessation education : 10/12/2022 Notes: Electronic Signature(s) Signed: 12/28/2022 3:57:42 PM By: Baruch Gouty RN, BSN Entered By: Baruch Gouty on 12/28/2022 11:10:54 -------------------------------------------------------------------------------- Pain Assessment Details Patient Name: Date of Service: [facility], [name] 12/28/2022 10:30 A M Medical Record Number: [id number] Patient  Account Number: [id number] Date of Birth/Sex: Treating RN: 09-06-1951 (71 y.o. [name] Primary Care Gena Laski: [name] Other Clinician: Referring Latoya Maulding: Treating Kevyn Wengert/Extender: [name] Weeks in Treatment: 11 Active Problems Location of Pain Severity and Description of Pain Patient Has Paino No Site Locations Rate the pain. Current Pain Level: 0 Pain Management and Medication Current Pain Management: Electronic Signature(s) Signed: 12/28/2022 3:57:42 PM By: Baruch Gouty RN, BSN Entered By: Baruch Gouty on 12/28/2022 10:59:54 Husk, Arael ([id number] Page 6 of 8 -------------------------------------------------------------------------------- Patient/Caregiver Education Details Patient Name: Date of Service: [name], [name] 3/28/2024andnbsp10:30 A M Medical Record Number: [id number] Patient Account Number: [id number] Date of Birth/Gender: Treating RN: Apr 09, 1951 (72 y.o. [name] Primary Care Physician: [name] Other Clinician: Referring Physician: Treating Physician/Extender: Levin Erp in Treatment: 11 Education Assessment Education Provided To: Patient Education Topics Provided Elevated Blood Sugar/ Impact on Healing: Methods: Explain/Verbal Responses: Reinforcements needed, State content correctly Hyperbaric Oxygenation: Methods: Explain/Verbal Responses: Reinforcements needed, State content correctly Wound/Skin Impairment: Methods: Explain/Verbal Responses: Reinforcements needed, State content correctly Electronic Signature(s) Signed: 12/28/2022 3:57:42 PM By: Baruch Gouty RN, BSN Entered By: Baruch Gouty on 12/28/2022 11:11:29 -------------------------------------------------------------------------------- Wound Assessment Details Patient Name: Date of Service: [facility] [name], [name] 12/28/2022 10:30 A M Medical Record Number:  [id number] Patient Account Number: [id number] Date of Birth/Sex: Treating RN: 1951-10-01 (72 y.o. [name] Primary Care [name]: [name] Other Clinician: Referring Kiandre Spagnolo: Treating Sayde Lish/Extender: [name] Weeks in Treatment: 11 Wound Status Wound Number: 1 Primary Diabetic Wound/Ulcer of the Lower  Extremity Etiology: Wound Location: Right, Medial T Great oe Secondary Acute Osteomyelitis Wounding Event: Pressure Injury Etiology: Date Acquired: 08/16/2022 Wound Open Weeks Of Treatment: 11 Status: Clustered Wound: No Comorbid Cataracts, Hypertension, Type II Diabetes, Osteomyelitis, History: Neuropathy, Confinement Anxiety Photos Wound Measurements Length: (cm) 1 Bergquist, Umar ([id number]) Width: (cm) 2. Depth: (cm) 0. Area: (cm) 1 Volume: (cm) 0 % Reduction in Area: -613.8% [id number] Page 7 of 8 5 % Reduction in Volume: -613.6% 4 Epithelialization: Small (1-33%) .74 Tunneling: No .785 Undermining: No Wound Description Classification: Grade 3 Wound Margin: Distinct, outline attached Exudate Amount: Medium Exudate Type: Serosanguineous Exudate Color: red, brown Foul Odor After Cleansing: No Slough/Fibrino Yes Wound Bed Granulation Amount: Medium (34-66%) Exposed Structure Granulation Quality: Pink Fascia Exposed: No Necrotic Amount: Medium (34-66%) Fat Layer (Subcutaneous Tissue) Exposed: Yes Necrotic Quality: Adherent Slough Tendon Exposed: Yes Muscle Exposed: No Joint Exposed: No Bone Exposed: Yes Periwound Skin Texture Texture Color No Abnormalities Noted: Yes No Abnormalities Noted: Yes Moisture Temperature / Pain No Abnormalities Noted: Yes Temperature: No Abnormality Treatment Notes Wound #1 (Toe Great) Wound Laterality: Right, Medial Cleanser Soap and Water Discharge Instruction: May shower and wash wound with dial antibacterial soap and water prior to dressing change. Wound  Cleanser Discharge Instruction: Cleanse the wound with wound cleanser prior to applying a clean dressing using gauze sponges, not tissue or cotton balls. Peri-Wound Care Zinc Oxide Ointment 30g tube Discharge Instruction: Apply Zinc Oxide to periwound with each dressing change as needed Topical Gentamicin Discharge Instruction: As directed by physician Primary Dressing Hydrofera Blue Ready Transfer Foam, 2.5x2.5 (in/in) Discharge Instruction: Apply directly to wound bed as directed Secondary Dressing Woven Gauze Sponge, Non-Sterile 4x4 in Discharge Instruction: Apply over primary dressing as directed. Secured With Conforming Stretch Gauze Bandage, Sterile 2x75 (in/in) Discharge Instruction: Secure with stretch gauze as directed. 41M Medipore Soft Cloth Surgical T 2x10 (in/yd) ape Discharge Instruction: Secure with tape as directed. Compression Wrap Compression Stockings Add-Ons Electronic Signature(s) Signed: 12/28/2022 3:57:42 PM By: Baruch Gouty RN, BSN Entered By: Baruch Gouty on 12/28/2022 11:08:16 [name], [name] ([id number] Page 8 of 8 -------------------------------------------------------------------------------- Vitals Details Patient Name: Date of ServiceShannan Harper [name], [name] 12/28/2022 10:30 A M Medical Record Number: [id number] Patient Account Number: [id number] Date of Birth/Sex: Treating RN: 1951/06/08 (73 y.o. [name] Primary Care Keyron Pokorski: [name] Other Clinician: Referring Tatiyanna Lashley: Treating Shizuko Wojdyla/Extender: [name] Weeks in Treatment: 11 Vital Signs Time Taken: 10:59 Temperature (F): 98 Height (in): 72 Pulse (bpm): 103 Source: Stated Respiratory Rate (breaths/min): 18 Weight (lbs): 185 Blood Pressure (mmHg): 194/86 Source: Stated Capillary Blood Glucose (mg/dl): 148 Body Mass Index (BMI): 25.1 Reference Range: 80 - 120 mg / dl Electronic Signature(s) Signed: 12/28/2022  3:57:42 PM By: Baruch Gouty RN, BSN Entered By: Baruch Gouty on 12/28/2022 11:16:11

## 2022-12-28 NOTE — Progress Notes (Signed)
[facility], Artavius ([id number]) [id number] Page 1 of 2 Visit Report for 12/28/2022 Problem List Details Patient Name: Date of Service: [name], [name] 12/28/2022 12:00 PM Medical Record Number: [id number] Patient Account Number: [id number] Date of Birth/Sex: Treating RN: Dec 22, 1950 (72 y.o. 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 E11.621 Type 2 diabetes mellitus with foot ulcer 10/12/2022 No Yes N18.5 Chronic kidney disease, stage 5 10/12/2022 No Yes M86.9 Osteomyelitis, unspecified 10/12/2022 No Yes E11.42 Type 2 diabetes mellitus with diabetic polyneuropathy 10/12/2022 No Yes I10 Essential (primary) hypertension 10/12/2022 No Yes Inactive Problems Resolved Problems Electronic Signature(s) Signed: 12/28/2022 2:48:34 PM By: Valeria Batman EMT Signed: 12/28/2022 2:58:03 PM By: Fredirick Maudlin MD FACS Entered By: Valeria Batman on 12/28/2022 14:48:34 -------------------------------------------------------------------------------- SuperBill Details Patient Name: Date of Service: [name], [name] 12/28/2022 Medical Record Number: [id number] Patient Account Number: [id number] Date of Birth/Sex: Treating RN: 1950/12/22 (72 y.o. [name] Primary Care Provider: [name] Other Clinician: Valeria Batman Referring Provider: Treating Provider/Extender: [name] Weeks in Treatment: 11 Diagnosis Coding ICD-10 Codes Code Description L97.514 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.621 Type 2 diabetes mellitus with foot ulcer [name], [name] ([id number])  [id number] Page 2 of 2 N18.5 Chronic kidney disease, stage 5 M86.9 Osteomyelitis, unspecified E11.42 Type 2 diabetes mellitus with diabetic polyneuropathy I10 Essential (primary) hypertension Facility Procedures : CPT4 Code Description: [id number]-(Facility Use Only) HBOT full body chamber, 17min , ICD-10 Diagnosis Description M86.9 Osteomyelitis, unspecified L97.514 Non-pressure chronic ulcer of other part of right foo E11.42 Type 2 diabetes mellitus with  diabetic polyneuropathy E11.621 Type 2 diabetes mellitus with foot ulcer Modifier: t with necrosis Quantity: 4 of bone Physician Procedures : CPT4 Code Description Modifier [id number] - WC PHYS HYPERBARIC OXYGEN THERAPY ICD-10 Diagnosis Description M86.9 Osteomyelitis, unspecified 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

## 2022-12-28 NOTE — Progress Notes (Signed)
[place], Graviel ([id number]) [id number] Page 1 of 2 Visit Report for 12/27/2022 HBO Details Patient Name: Date of Service: [name], [name] [name] 12/27/2022 12:00 PM Medical Record Number: [id number] Patient Account Number: [id number] Date of Birth/Sex: Treating RN: 28-Nov-1950 (72 y.o. 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 HBO Treatment Details Treatment Number: 36 Patient Type: Outpatient Chamber Type: Monoplace Chamber Serial #: [id number] Treatment Protocol: 2.5 ATA with 90 minutes oxygen, with two 5 minute air breaks Treatment Details Compression Rate Down: 2.0 psi / minute De-Compression Rate Up: 2.0 psi / minute A breaks and breathing ir Compress Tx Pressure periods Decompress Decompress Begins Reached (leave unused spaces Begins Ends blank) Chamber Pressure (ATA 1 2.5 2.5 2.5 2.5 2.5 - - 2.5 1 ) Clock Time (24 hr) 11:15 11:29 11:59 12:29 12:34 12:39 - - 13:09 13:23 Treatment Length: 128 (minutes) Treatment Segments: 4 Vital Signs Capillary Blood Glucose Reference Range: 80 - 120 mg / dl HBO Diabetic Blood Glucose Intervention Range: <131 mg/dl or >249 mg/dl Time Vitals Blood Respiratory Capillary Blood Glucose Pulse Action Type: Pulse: Temperature: Taken: Pressure: Rate: Glucose (mg/dl): Meter #: Oximetry (%) Taken: Pre 11:01 212 Post 13:30 146/69 86 18 97.9 192 Pre 11:10 170/75 103 18 97.9 Treatment Response Treatment Toleration: Well Treatment Completion Status: Treatment Completed without Adverse Event Physician HBO Attestation: I certify that I supervised this HBO  treatment in accordance with Medicare guidelines. A trained emergency response team is readily available per Yes hospital policies and procedures. Continue HBOT as ordered. Yes Electronic Signature(s) Signed: 12/27/2022 4:47:26 PM By: [name] Maudlin MD FACS Previous Signature: 12/27/2022 3:06:39 PM Version By: Valeria Batman EMT Entered By: [name] on 12/27/2022 16:47:25 -------------------------------------------------------------------------------- HBO Safety Checklist Details Patient Name: Date of Service: [name], [name] 12/27/2022 12:00 PM Medical Record Number: [id number] Patient Account Number: [id number] Date of Birth/Sex: Treating RN: 07-10-1951 (72 y.o. [name] Primary Care [name]: [name] Other Clinician: Valeria Batman Referring [name]: Treating [name]/Extender: [name] Weeks in Treatment: [place], [name] [id number] Page 2 of 2 Safety Checklist Consent Form Signed Patient voided / foley secured and emptied When did you last eato 1000 Last dose of injectable or oral agent last night Ostomy pouch emptied and vented if applicable NA All implantable devices assessed, documented and approved Dialysis shunt Intravenous access site secured and place NA Valuables secured Linens and cotton and cotton/polyester blend (less than 51% polyester) Personal oil-based products / skin lotions / body lotions removed Wigs or hairpieces removed NA Smoking or tobacco materials removed Books / newspapers / magazines / loose paper removed Cologne, aftershave, perfume and deodorant removed Jewelry removed (may wrap wedding band) Make-up removed NA Hair care products removed Battery operated devices (external) removed Heating patches and chemical warmers removed NA Titanium eyewear removed NA Nail polish cured greater than 10 hours NA Casting material cured greater  than 10 hours NA Hearing aids removed NA Loose dentures or partials removed removed by patient Prosthetics have been removed NA Patient demonstrates correct use of air break device (if applicable) Patient concerns have been addressed Patient grounding bracelet on and cord attached to chamber Specifics for Inpatients (complete in addition to above) Medication  sheet sent with patient NA Intravenous medications needed or due during therapy sent with patient NA Drainage tubes (e.g. nasogastric tube or chest tube secured and vented) NA Endotracheal or Tracheotomy tube secured NA Cuff deflated of air and inflated with saline NA Airway suctioned NA Notes The safety checklist was done before the treatment was started. Electronic Signature(s) Signed: 12/27/2022 3:04:16 PM By: Valeria Batman EMT Entered By: Valeria Batman on 12/27/2022 15:04:16

## 2022-12-28 NOTE — Progress Notes (Signed)
[place], [name] ([id number]) [id number] Page 1 of 2 Visit Report for 12/28/2022 Arrival Information Details Patient Name: Date of Service: [name], [name] 12/28/2022 12:00 PM Medical Record Number: [id number] Patient Account Number: [id number] Date of Birth/Sex: Treating RN: [birth year] (72 y.o. [name], [name] Primary Care Dailee Manalang: [name] Other Clinician: Valeria Batman Referring Leaner Morici: Treating [name]/Extender: [name] Weeks in Treatment: 11 Visit Information History Since Last Visit All ordered tests and consults were completed: Yes Patient Arrived: Ambulatory Added or deleted any medications: No Arrival Time: 10:44 Any new allergies or adverse reactions: No Accompanied By: None Had a fall or experienced change in No Transfer Assistance: None activities of daily living that may affect Patient Identification Verified: Yes risk of falls: Secondary Verification Process Completed: Yes Signs or symptoms of abuse/neglect since last visito No Patient Requires Transmission-Based Precautions: No Hospitalized since last visit: No Patient Has Alerts: No Implantable device outside of the clinic excluding No cellular tissue based products placed in the center since last visit: Pain Present Now: No Electronic Signature(s) Signed: 12/28/2022 2:43:53 PM By: Valeria Batman EMT Entered By: Valeria Batman on 12/28/2022 14:43:53 -------------------------------------------------------------------------------- Encounter Discharge Information Details Patient Name: Date of Service: [facility] [name], [name] 12/28/2022 12:00 PM Medical Record Number: [id number] Patient Account Number: [id number] Date of Birth/Sex: Treating RN: 02-21-1951 (72 y.o. [name] Primary Care Aya Geisel: [name] Other Clinician: Valeria Batman Referring Imanii Gosdin: Treating Bevin Das/Extender: [name] Weeks in Treatment: 11 Encounter  Discharge Information Items Discharge Condition: Stable Ambulatory Status: Ambulatory Discharge Destination: Home Transportation: Private Auto Accompanied By: None Schedule Follow-up Appointment: Yes Clinical Summary of Care: Electronic Signature(s) Signed: 12/28/2022 2:50:27 PM By: Valeria Batman EMT Entered By: Valeria Batman on 12/28/2022 14:50:27 -------------------------------------------------------------------------------- Vitals Details Patient Name: Date of Service: [facility] [name], [name] 12/28/2022 12:00 PM Medical Record Number: [id number] Patient Account Number: [id number] Date of Birth/Sex: Treating RN: 1951/04/01 (72 y.o. [name] Primary Care Alahni Varone: [name] Other Clinician: Valeria Batman Referring Zoraya Fiorenza: Treating Demetrice Amstutz/Extender: [name] Weeks in Treatment: 11 Vital Signs Time Taken: 11:16 Capillary Blood Glucose (mg/dl): 148 [place], [name] ([id number]) [id number] Page 2 of 2 Height (in): 72 Reference Range: 80 - 120 mg / dl Weight (lbs): 185 Body Mass Index (BMI): 25.1 Electronic Signature(s) Signed: 12/28/2022 2:44:10 PM By: Valeria Batman EMT Entered By: Valeria Batman on 12/28/2022 14:44:10

## 2022-12-28 NOTE — Progress Notes (Signed)
VIRLYN, TIMMER ([id number]) [id number] Page 1 of 2 Visit Report for 12/27/2022 Problem List Details Patient Name: Date of Service: [name], [name] 12/27/2022 12:00 PM Medical Record Number: [id number] Patient Account Number: [id number] Date of Birth/Sex: Treating RN: Feb 09, 1951 (72 y.o. 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, stage 5 10/12/2022 No Yes M86.9 Osteomyelitis, unspecified 10/12/2022 No Yes E11.42 Type 2 diabetes mellitus with diabetic polyneuropathy 10/12/2022 No Yes I10 Essential (primary) hypertension 10/12/2022 No Yes Inactive Problems Resolved Problems Electronic Signature(s) Signed: 12/27/2022 3:07:24 PM By: Valeria Batman EMT Signed: 12/27/2022 4:43:02 PM By: Fredirick Maudlin MD FACS Entered By: Valeria Batman on 12/27/2022 15:07:24 -------------------------------------------------------------------------------- SuperBill Details Patient Name: Date of Service: [name], [name] 12/27/2022 Medical Record Number: [id number] Patient Account Number: [id number] Date of Birth/Sex: Treating RN: [birth year] (72 y.o. 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, stage 5 M86.9 Osteomyelitis, unspecified E11.42 Type 2 diabetes mellitus with diabetic polyneuropathy I10 Essential (primary) hypertension Facility Procedures : CPT4 Code Description: [id number]-(Facility Use Only) HBOT full body chamber, 57min , ICD-10 Diagnosis Description M86.9 Osteomyelitis, unspecified L97.514 Non-pressure chronic ulcer of other part of right foo E11.42 Type 2 diabetes mellitus with  diabetic polyneuropathy E11.621 Type 2 diabetes mellitus with foot ulcer Modifier: t with necrosis Quantity: 4 of bone Physician Procedures : CPT4 Code Description Modifier [id number] - WC PHYS HYPERBARIC OXYGEN THERAPY ICD-10 Diagnosis Description M86.9 Osteomyelitis, unspecified L97.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

## 2022-12-29 ENCOUNTER — Encounter (HOSPITAL_BASED_OUTPATIENT_CLINIC_OR_DEPARTMENT_OTHER): Payer: No Typology Code available for payment source | Admitting: General Surgery

## 2022-12-29 DIAGNOSIS — E11621 Type 2 diabetes mellitus with foot ulcer: Secondary | ICD-10-CM | POA: Diagnosis not present

## 2022-12-29 DIAGNOSIS — M86671 Other chronic osteomyelitis, right ankle and foot: Secondary | ICD-10-CM | POA: Diagnosis not present

## 2022-12-29 LAB — GLUCOSE, CAPILLARY
Glucose-Capillary: 159 mg/dL — ABNORMAL HIGH (ref 70–99)
Glucose-Capillary: 256 mg/dL — ABNORMAL HIGH (ref 70–99)
Glucose-Capillary: 250 mg/dL — ABNORMAL HIGH (ref 70–99)

## 2022-12-30 NOTE — Progress Notes (Signed)
[place], [name] ([id number]) [id number] Page 1 of 2 Visit Report for 12/29/2022 Arrival Information Details Patient Name: Date of Service: [name], [name] 12/29/2022 12:00 PM Medical Record Number: [id number] Patient Account Number: [id number] Date of Birth/Sex: Treating RN: 06-21-1951 (72 y.o. [name], [name] Primary Care Deseree Zemaitis: [name] Other Clinician: Donavan Burnet Referring Tamula Morrical: Treating Boruch Manuele/Extender: [name] Weeks in Treatment: 11 Visit Information History Since Last Visit All ordered tests and consults were completed: Yes Patient Arrived: Cane Added or deleted any medications: No Arrival Time: 11:06 Any new allergies or adverse reactions: No Accompanied By: self Had a fall or experienced change in No Transfer Assistance: None activities of daily living that may affect Patient Identification Verified: Yes risk of falls: Secondary Verification Process Completed: Yes Signs or symptoms of abuse/neglect since last visito No Patient Requires Transmission-Based Precautions: No Hospitalized since last visit: No Patient Has Alerts: No Implantable device outside of the clinic excluding No cellular tissue based products placed in the center since last visit: Pain Present Now: No Electronic Signature(s) Signed: 12/29/2022 3:46:10 PM By: Donavan Burnet CHT EMT BS , , Entered By: Donavan Burnet on 12/29/2022 15:46:10 -------------------------------------------------------------------------------- Encounter Discharge Information Details Patient Name: Date of Service: [facility], [name] 12/29/2022 12:00 PM Medical Record Number: [id number] Patient Account Number: [id number] Date of Birth/Sex: Treating RN: 1951-06-28 (72 y.o. [name] Primary Care Olney Monier: [name] Other Clinician: Donavan Burnet Referring Jacey Pelc: Treating Xochilth Standish/Extender: [name] Weeks in Treatment:  11 Encounter Discharge Information Items Discharge Condition: Stable Ambulatory Status: Cane Discharge Destination: Home Transportation: Private Auto Accompanied By: self Schedule Follow-up Appointment: No Clinical Summary of Care: Electronic Signature(s) Signed: 12/29/2022 4:28:36 PM By: Donavan Burnet CHT EMT BS , , Entered By: Donavan Burnet on 12/29/2022 16:28:36 -------------------------------------------------------------------------------- Vitals Details Patient Name: Date of Service: [facility] [name], [name] 12/29/2022 12:00 PM Medical Record Number: [id number] Patient Account Number: [id number] Date of Birth/Sex: Treating RN: [birth year] (72 y.o. [name] Primary Care Wadie Mattie: [name] Other Clinician: Donavan Burnet Referring Alora Gorey: Treating [name] Sharpnack/Extender: [name] Weeks in Treatment: 11 Vital Signs Time Taken: 11:15 Temperature (F): 98.4 [place], [place] ([id number]) [phone].pdf Page 2 of 2 Height (in): 72 Pulse (bpm): 95 Weight (lbs): 185 Respiratory Rate (breaths/min): 18 Body Mass Index (BMI): 25.1 Blood Pressure (mmHg): 171/72 Capillary Blood Glucose (mg/dl): 256 Reference Range: 80 - 120 mg / dl Electronic Signature(s) Signed: 12/29/2022 3:46:37 PM By: Donavan Burnet CHT EMT BS , , Entered By: Donavan Burnet on 12/29/2022 15:46:37

## 2022-12-30 NOTE — Progress Notes (Signed)
[name], [name] ([id number]) [id number] Page 1 of 2 Visit Report for 12/29/2022 HBO Details Patient Name: Date of Service: [name], [name] 12/29/2022 12:00 PM Medical Record Number: [id number] Patient Account Number: [id number] Date of Birth/Sex: Treating RN: 1951-05-01 (72 y.o. 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 HBO Treatment Details Treatment Number: 38 Patient Type: Outpatient Chamber Type: Monoplace Chamber Serial #: [id number] Treatment Protocol: 2.5 ATA with 90 minutes oxygen, with two 5 minute air breaks Treatment Details Compression Rate Down: 1.5 psi / minute De-Compression Rate Up: 2.0 psi / minute A breaks and breathing ir Compress Tx Pressure periods Decompress Decompress Begins Reached (leave unused spaces Begins Ends blank) Chamber Pressure (ATA 1 2.5 2.5 2.5 2.5 2.5 - - 2.5 1 ) Clock Time (24 hr) 11:40 11:57 12:27 12:32 13:02 13:07 - - 13:37 13:48 Treatment Length: 128 (minutes) Treatment Segments: 4 Vital Signs Capillary Blood Glucose Reference Range: 80 - 120 mg / dl HBO Diabetic Blood Glucose Intervention Range: <131 mg/dl or >249 mg/dl Type: Time Vitals Blood Respiratory Capillary Blood Glucose Pulse Action Pulse: Temperature: Taken: Pressure: Rate: Glucose (mg/dl): Meter #: Oximetry (%) Taken: Pre 11:15 171/72 95 18 98.4 256 1 none per protocol Post 13:49 160/77 97 16 98.1 250 1 none per protocol Treatment Response Treatment Toleration: Well Treatment Completion Status: Treatment Completed without Adverse Event Treatment Notes Mr. Veasley arrived and  blood glucose level was measured at 256 mg/dL. Other vital signs were normal. After performing safety check, patient was placed in the chamber which was compressed at a rate of 2 psi/min until reaching treatment depth. He tolerated treatment and subsequent decompression at the rate of 2 psi/min. He denied any issues with ear equalization and/or pain. Post-treatment vital signs were normal. He was stable upon discharge. Physician HBO Attestation: I certify that I supervised this HBO treatment in accordance with Medicare guidelines. A trained emergency response team is readily available per Yes hospital policies and procedures. Continue HBOT as ordered. Yes Electronic Signature(s) Signed: 01/01/2023 7:41:20 AM By: Fredirick Maudlin MD FACS Previous Signature: 12/29/2022 4:27:39 PM Version By: Donavan Burnet CHT EMT BS , , Entered By: Fredirick Maudlin on 01/01/2023 07:41:20 -------------------------------------------------------------------------------- HBO Safety Checklist Details Patient Name: Date of Service: [facility], [name] 12/29/2022 12:00 PM Medical Record Number: [id number] Patient Account Number: [id number] Date of Birth/Sex: Treating RN: 1951/02/20 (72 y.o. [name] Primary Care Lamyiah Crawshaw: [name] Other Clinician: Donavan Burnet [place], [name] ([id number]) [id number] Page 2 of 2 Referring Gustaf Mccarter: Treating Sheranda Seabrooks/Extender: [name] Weeks in Treatment: 11 HBO Safety Checklist Items Safety Checklist Consent Form Signed Patient voided / foley secured and emptied When did you last eato 1030 Last dose of injectable or oral agent Last evening Ostomy pouch emptied and vented if applicable NA All implantable devices assessed, documented and approved NA Intravenous access site secured and place NA Valuables secured Linens and cotton and cotton/polyester blend (less than 51% polyester) Personal oil-based products / skin  lotions / body lotions removed Wigs or hairpieces removed NA Smoking or tobacco materials removed NA Books / newspapers / magazines / loose paper removed Cologne, aftershave, perfume and deodorant removed Jewelry removed (may wrap wedding band) Make-up removed NA Hair care products  removed Battery operated devices (external) removed Heating patches and chemical warmers removed Titanium eyewear removed Nail polish cured greater than 10 hours NA Casting material cured greater than 10 hours NA Hearing aids removed NA Loose dentures or partials removed dentures removed Prosthetics have been removed NA Patient demonstrates correct use of air break device (if applicable) Patient concerns have been addressed Patient grounding bracelet on and cord attached to chamber Specifics for Inpatients (complete in addition to above) Medication sheet sent with patient NA Intravenous medications needed or due during therapy sent with patient NA Drainage tubes (e.g. nasogastric tube or chest tube secured and vented) NA Endotracheal or Tracheotomy tube secured NA Cuff deflated of air and inflated with saline NA Airway suctioned NA Notes Paper version used prior to treatment start. Electronic Signature(s) Signed: 12/29/2022 3:47:21 PM By: Donavan Burnet CHT EMT BS , , Entered By: Donavan Burnet on 12/29/2022 15:47:21

## 2023-01-01 ENCOUNTER — Encounter (HOSPITAL_BASED_OUTPATIENT_CLINIC_OR_DEPARTMENT_OTHER): Payer: No Typology Code available for payment source | Admitting: General Surgery

## 2023-01-01 NOTE — Progress Notes (Signed)
CONSTANCIO, DOBKIN ([id number]) [id number] Page 1 of 1 Visit Report for 12/29/2022 SuperBill Details Patient Name: Date of Service: [name], [name] 12/29/2022 Medical Record Number: [id number] Patient Account Number: [id number] Date of Birth/Sex: Treating RN: 12-03-1950 (72 y.o. [name], [name] Primary Care Provider: [name] Other Clinician: Donavan Burnet Referring Provider: Treating Provider/Extender: [name] Weeks in Treatment: 11 Diagnosis Coding ICD-10 Codes Code Description [phone] 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.621 Type 2 diabetes mellitus with foot ulcer N18.5 Chronic kidney disease, stage 5 M86.9 Osteomyelitis, unspecified E11.42 Type 2 diabetes mellitus with diabetic polyneuropathy I10 Essential (primary) hypertension Facility Procedures CPT4 Code Description Modifier Quantity [id number] G0277-(Facility Use Only) HBOT full body chamber, 76min , 4 ICD-10 Diagnosis Description M86.9 Osteomyelitis, unspecified 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.621 Type 2 diabetes mellitus with foot ulcer Physician Procedures Quantity CPT4 Code Description Modifier [id number] - WC PHYS HYPERBARIC OXYGEN THERAPY 1 ICD-10 Diagnosis Description M86.9 Osteomyelitis, unspecified 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

## 2023-01-02 ENCOUNTER — Encounter (HOSPITAL_BASED_OUTPATIENT_CLINIC_OR_DEPARTMENT_OTHER): Payer: No Typology Code available for payment source | Admitting: General Surgery

## 2023-01-03 ENCOUNTER — Encounter (HOSPITAL_BASED_OUTPATIENT_CLINIC_OR_DEPARTMENT_OTHER): Payer: No Typology Code available for payment source | Admitting: General Surgery

## 2023-01-03 DIAGNOSIS — Z992 Dependence on renal dialysis: Secondary | ICD-10-CM | POA: Diagnosis not present

## 2023-01-03 DIAGNOSIS — N186 End stage renal disease: Secondary | ICD-10-CM | POA: Diagnosis not present

## 2023-01-03 DIAGNOSIS — D631 Anemia in chronic kidney disease: Secondary | ICD-10-CM | POA: Diagnosis not present

## 2023-01-03 DIAGNOSIS — E8779 Other fluid overload: Secondary | ICD-10-CM | POA: Diagnosis not present

## 2023-01-04 ENCOUNTER — Encounter (HOSPITAL_BASED_OUTPATIENT_CLINIC_OR_DEPARTMENT_OTHER): Payer: No Typology Code available for payment source | Admitting: General Surgery

## 2023-01-04 ENCOUNTER — Encounter (HOSPITAL_BASED_OUTPATIENT_CLINIC_OR_DEPARTMENT_OTHER): Payer: No Typology Code available for payment source | Attending: General Surgery | Admitting: General Surgery

## 2023-01-04 DIAGNOSIS — E11621 Type 2 diabetes mellitus with foot ulcer: Secondary | ICD-10-CM | POA: Diagnosis not present

## 2023-01-04 DIAGNOSIS — Z833 Family history of diabetes mellitus: Secondary | ICD-10-CM | POA: Diagnosis not present

## 2023-01-04 DIAGNOSIS — N186 End stage renal disease: Secondary | ICD-10-CM | POA: Diagnosis not present

## 2023-01-04 DIAGNOSIS — M869 Osteomyelitis, unspecified: Secondary | ICD-10-CM | POA: Diagnosis not present

## 2023-01-04 DIAGNOSIS — E1142 Type 2 diabetes mellitus with diabetic polyneuropathy: Secondary | ICD-10-CM | POA: Diagnosis not present

## 2023-01-04 DIAGNOSIS — E1122 Type 2 diabetes mellitus with diabetic chronic kidney disease: Secondary | ICD-10-CM | POA: Insufficient documentation

## 2023-01-04 DIAGNOSIS — I120 Hypertensive chronic kidney disease with stage 5 chronic kidney disease or end stage renal disease: Secondary | ICD-10-CM | POA: Insufficient documentation

## 2023-01-04 DIAGNOSIS — M86671 Other chronic osteomyelitis, right ankle and foot: Secondary | ICD-10-CM | POA: Diagnosis not present

## 2023-01-04 DIAGNOSIS — L97514 Non-pressure chronic ulcer of other 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: Secondary | ICD-10-CM | POA: Insufficient documentation

## 2023-01-04 DIAGNOSIS — Z992 Dependence on renal dialysis: Secondary | ICD-10-CM | POA: Diagnosis not present

## 2023-01-04 LAB — GLUCOSE, CAPILLARY
Glucose-Capillary: 226 mg/dL — ABNORMAL HIGH (ref 70–99)
Glucose-Capillary: 277 mg/dL — ABNORMAL HIGH (ref 70–99)

## 2023-01-04 NOTE — Progress Notes (Signed)
MYERS, BOTZ ([id number]) [id number] Page 1 of 2 Visit Report for 01/04/2023 Arrival Information Details Patient Name: Date of Service: [place], [name] 01/04/2023 12:00 PM Medical Record Number: [id number] Patient Account Number: [id number] Date of Birth/Sex: Treating RN: 1951-05-27 (72 y.o. [name], [name] Primary Care Yanissa Michalsky: [name] Other Clinician: Donavan Burnet Referring Tea Collums: Treating Paighton Godette/Extender: [name] Weeks in Treatment: 12 Visit Information History Since Last Visit Added or deleted any medications: No Patient Arrived: [name] Any new allergies or adverse reactions: No Arrival Time: 11:22 Had a fall or experienced change in No Accompanied By: self activities of daily living that may affect Transfer Assistance: None risk of falls: Patient Identification Verified: Yes Signs or symptoms of abuse/neglect since last visito No Secondary Verification Process Completed: Yes Hospitalized since last visit: No Patient Requires Transmission-Based Precautions: No Implantable device outside of the clinic excluding No Patient Has Alerts: No cellular tissue based products placed in the center since last visit: Pain Present Now: No Electronic Signature(s) Signed: 01/04/2023 4:19:15 PM By: Deon Pilling RN, BSN Entered By: Deon Pilling on 01/04/2023 14:42:24 -------------------------------------------------------------------------------- Encounter Discharge Information Details Patient Name: Date of Service: [facility] [name], [name] 01/04/2023 12:00 PM Medical Record Number: [id number] Patient Account Number: [id number] Date of Birth/Sex: Treating RN: 04/24/1951 (72 y.o. [name] Primary Care Daila Elbert: [name] Other Clinician: Donavan Burnet Referring Gedalya Jim: Treating Ophie Burrowes/Extender: [name] Weeks in Treatment: 12 Encounter Discharge Information Items Discharge Condition:  Stable Ambulatory Status: Cane Discharge Destination: Home Transportation: Private Auto Accompanied By: self Schedule Follow-up Appointment: Yes Clinical Summary of Care: Electronic Signature(s) Signed: 01/04/2023 4:19:15 PM By: Deon Pilling RN, BSN Entered By: Deon Pilling on 01/04/2023 14:46:02 -------------------------------------------------------------------------------- Vitals Details Patient Name: Date of Service: [facility] [name], [name] 01/04/2023 12:00 PM Medical Record Number: [id number] Patient Account Number: [id number] Date of Birth/Sex: Treating RN: [birth year] (72 y.o. [name] Primary Care Rodolph Hagemann: [name] Other Clinician: Donavan Burnet Referring Sheikh Leverich: Treating Daneisha Surges/Extender: [name] Weeks in Treatment: 12 Vital Signs Time Taken: 11:22 Temperature (F): 97.7 Height (in): 72 Pulse (bpm): 94 Tripathi, Tou ([id number]) [id number] Page 2 of 2 Weight (lbs): 185 Respiratory Rate (breaths/min): 20 Body Mass Index (BMI): 25.1 Blood Pressure (mmHg): 169/68 Capillary Blood Glucose (mg/dl): 226 Reference Range: 80 - 120 mg / dl Electronic Signature(s) Signed: 01/04/2023 4:19:15 PM By: Deon Pilling RN, BSN Entered By: Deon Pilling on 01/04/2023 14:43:26

## 2023-01-04 NOTE — Progress Notes (Signed)
[name], [name] ([id number]) [id number] Page 1 of 2 Visit Report for 01/04/2023 HBO Details Patient Name: Date of Service: [facility] [name], [name] 01/04/2023 12:00 PM Medical Record Number: [id number] Patient Account Number: [id number] Date of Birth/Sex: Treating RN: 1951/02/03 (72 y.o. 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 HBO Treatment Details Treatment Number: 39 Patient Type: Outpatient Chamber Type: Monoplace Chamber Serial #: [id number] Treatment Protocol: 2.5 ATA with 90 minutes oxygen, with two 5 minute air breaks Treatment Details Compression Rate Down: 2.0 psi / minute De-Compression Rate Up: 2.0 psi / minute A breaks and breathing ir Compress Tx Pressure periods Decompress Decompress Begins Reached (leave unused spaces Begins Ends blank) Chamber Pressure (ATA 1 2.5 2.5 2.5 2.5 2.5 - - 2.5 1 ) Clock Time (24 hr) 11:54 12:06 12:36 12:41 13:11 13:16 - - 13:46 13:59 Treatment Length: 125 (minutes) Treatment Segments: 4 Vital Signs Capillary Blood Glucose Reference Range: 80 - 120 mg / dl HBO Diabetic Blood Glucose Intervention Range: <131 mg/dl or >249 mg/dl Time Vitals Blood Respiratory Capillary Blood Glucose Pulse Action Type: Pulse: Temperature: Taken: Pressure: Rate: Glucose (mg/dl): Meter #: Oximetry (%) Taken: Pre 11:22 169/68 94 20 97.7 226 Post 14:00 146/65 82 20 98.4 277 Treatment Response Treatment Toleration: Well Treatment Completion Status: Treatment Completed without Adverse Event Physician HBO Attestation: I certify that I supervised this HBO treatment in  accordance with Medicare guidelines. A trained emergency response team is readily available per Yes hospital policies and procedures. Continue HBOT as ordered. Yes Electronic Signature(s) Signed: 01/04/2023 3:34:41 PM By: [name] Maudlin MD FACS Entered By: [name] on 01/04/2023 15:34:41 -------------------------------------------------------------------------------- HBO Safety Checklist Details Patient Name: Date of Service: [facility] [name], [name] 01/04/2023 12:00 PM Medical Record Number: [id number] Patient Account Number: [id number] Date of Birth/Sex: Treating RN: 1951/05/06 (72 y.o. [name] Primary Care [name]: [name] Other Clinician: Donavan [name] Referring Kamaiyah Uselton: Treating [name]/Extender: [name] Weeks in Treatment: 12 HBO Safety Checklist Items Safety Checklist [place], [name] ([id number]) [id number] Page 2 of 2 Consent Form Signed Patient voided / foley secured and emptied When did you last eato 1030 chicken strips and fries Last dose of injectable or oral agent last night Ostomy pouch emptied and vented if applicable NA All implantable devices assessed, documented and approved NA Intravenous access site secured and place NA Valuables secured Linens and cotton and cotton/polyester blend (less than 51% polyester) Personal oil-based products / skin lotions / body lotions removed NA Wigs or hairpieces removed NA Smoking or tobacco materials removed NA Books / newspapers / magazines / loose paper removed NA Cologne, aftershave, perfume and deodorant removed NA Jewelry removed (may wrap wedding band) NA Make-up removed NA Hair care products removed NA Battery operated devices (external) removed NA Heating patches and chemical warmers removed NA Titanium eyewear removed NA Nail polish cured greater than 10 hours NA Casting material cured greater than 10 hours NA Hearing aids  removed NA Loose dentures or partials removed NA Prosthetics have been removed NA Patient demonstrates correct use of air break device (if applicable) Patient concerns have been addressed Patient grounding bracelet on and cord attached to chamber Specifics for Inpatients (complete in addition to above) Medication sheet sent with patient  Intravenous medications needed or due during therapy sent with patient Drainage tubes (e.g. nasogastric tube or chest tube secured and vented) Endotracheal or Tracheotomy tube secured Cuff deflated of air and inflated with saline Airway suctioned Electronic Signature(s) Signed: 01/04/2023 4:19:15 PM By: [name] Pilling RN, BSN Entered By: [name] on 01/04/2023 14:44:27

## 2023-01-04 NOTE — Progress Notes (Signed)
KINGJAMES, LOADHOLT ([id number]) [id number] Page 1 of 1 Visit Report for 01/04/2023 SuperBill Details Patient Name: Date of Service: [name], [name] 01/04/2023 Medical Record Number: [id number] Patient Account Number: [id number] Date of Birth/Sex: Treating RN: 1951-03-14 (72 y.o. 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, stage 5 M86.9 Osteomyelitis, unspecified E11.42 Type 2 diabetes mellitus with diabetic polyneuropathy I10 Essential (primary) hypertension Facility Procedures CPT4 Code Description Modifier Quantity [id number]-(Facility Use Only) HBOT full body chamber, 90min , 4 Physician Procedures Quantity CPT4 Code Description Modifier [id number] - WC PHYS HYPERBARIC OXYGEN THERAPY 1 ICD-10 Diagnosis Description M86.9 Osteomyelitis, unspecified E11.621 Type 2 diabetes mellitus with foot ulcer Electronic Signature(s) Signed: 01/04/2023 3:30:44 PM By: Fredirick Maudlin MD FACS Signed: 01/04/2023 4:19:15 PM By: Deon Pilling RN, BSN Entered By: Deon Pilling on 01/04/2023 14:45:49

## 2023-01-05 ENCOUNTER — Encounter (HOSPITAL_BASED_OUTPATIENT_CLINIC_OR_DEPARTMENT_OTHER): Payer: No Typology Code available for payment source | Admitting: General Surgery

## 2023-01-05 NOTE — Progress Notes (Signed)
[name] [name] ([id number]) [id number] Page 1 of 8 Visit Report for 01/04/2023 Arrival Information Details Patient Name: Date of Service[name] 01/04/2023 10:30 A M Medical Record Number: [id number] Patient Account Number: [id number] Date of Birth/Sex: Treating RN: Aug 07, 1951 (72 y.o. [name] SchoonerM) [name] [name] Primary Care [name] [name]: [name] [name], [name] [name] Clinician: Referring Kaizlee Carlino: Treating Marrion Accomando/Extender: [name] [name], [name] [name], [name] [name] in Treatment: 12 Visit Information History Since Last Visit Added or deleted any medications: Yes Patient Arrived: Cane Any new allergies or adverse reactions: No Arrival Time: 10:44 Had a fall or experienced change in No Accompanied By: self activities of daily living that may affect Transfer Assistance: None risk of falls: Patient Identification Verified: Yes Signs or symptoms of abuse/neglect since last visito No Secondary Verification Process Completed: Yes Hospitalized since last visit: No Patient Requires Transmission-Based Precautions: No Implantable device outside of the clinic excluding No Patient Has Alerts: No cellular tissue based products placed in the center since last visit: Has Dressing in Place as Prescribed: Yes Pain Present Now: No Electronic Signature(s) Signed: 01/04/2023 5:39:06 PM By: [name] [name], Linda RN, BSN Entered By: [name] [name], [name] on 01/04/2023 10:47:45 -------------------------------------------------------------------------------- Encounter Discharge Information Details [facility]: [name] 01/04/2023 10:30 A M Medical Record Number: [id number] Patient Account Number: [id number] Date of Birth/Sex: Treating RN: Aug 07, 1951 (72 y.o. [name] SchoonerM) [name] [name] Primary Care [name] [name]: [name] [name], [name] [name] Clinician: Referring Malaisha Silliman: Treating [name] [name]/Extender: [name] [name], [name] [name], [name] [name] in Treatment: 12 Encounter Discharge Information Items Post Procedure  Vitals Discharge Condition: Stable Temperature (F): 97.8 Ambulatory Status: Cane Pulse (bpm): 96 Discharge Destination: Home Respiratory Rate (breaths/min): 18 Transportation: Private Auto Blood Pressure (mmHg): 190/75 Accompanied By: self Schedule Follow-up Appointment: Yes Clinical Summary of Care: Patient Declined Electronic Signature(s) Signed: 01/04/2023 5:39:06 PM By: [name] [name], Linda RN, BSN Entered By: [name] [name], [name] on 01/04/2023 11:25:19 -------------------------------------------------------------------------------- Lower Extremity Assessment Details Patient Name: [name] 01/04/2023 10:30 A M Medical Record Number: [id number] Patient Account Number: [id number] Date of Birth/Sex: Treating RN: Aug 07, 1951 (72 y.o. [name] SchoonerM) [name] [name] Primary Care [name] [name]: [name] [name], [name] [name] Clinician: Referring Avik Leoni: Treating [name] [name]/Extender: [name] [name], [name] [name], [name] [name] in Treatment: 12 Edema Assessment Assessed: [Left: No] [Right: No] T[LeftBarbette [name]: HORPE, [name] ([id number])]([id number])] [Right: [id number] Page 2 of 8] Edema: [Left: N] [Right: o] Calf Left: Right: Point of Measurement: From Medial Instep 36 cm Ankle Left: Right: Point of Measurement: From Medial Instep 22 cm Vascular Assessment Pulses: Dorsalis Pedis Palpable: [Right:Yes] Electronic Signature(s) Signed: 01/04/2023 5:39:06 PM By: [name] [name], Linda RN, BSN Entered By: [name] [name], [name] on 01/04/2023 10:57:57 -------------------------------------------------------------------------------- Multi Wound Chart Details [facility]: [name] 01/04/2023 10:30 A M Medical Record Number: [id number] Patient Account Number: [id number] Date of Birth/Sex: Treating RN: Aug 07, 1951 (72 y.o. M) Primary Care [name] [name]: [name] [name], [name] [name] Clinician: Referring [name] [name]: Treating [name] [name]/Extender: [name] [name], [name] [name], [name] [name] in Treatment: 12 Vital  Signs Height(in): 72 Pulse(bpm): 96 Weight(lbs): 185 Blood Pressure(mmHg): 190/75 Body Mass Index(BMI): 25.1 Temperature(F): 97.8 Respiratory Rate(breaths/min): 18 [1:Photos:] [N/A:N/A] Right, Medial T Great oe N/A N/A Wound Location: Pressure Injury N/A N/A Wounding Event: Diabetic Wound/Ulcer of the Lower N/A N/A Primary Etiology: Extremity Acute Osteomyelitis N/A N/A Secondary Etiology: Cataracts, Hypertension, Type II N/A N/A Comorbid History: Diabetes, Osteomyelitis, Neuropathy, [name] [name] 08/16/2022 N/A N/A Date Acquired: 12 N/A N/A [name] of Treatment: Open N/A N/A Wound Status: No N/A N/A Wound Recurrence: 0.7x1x0.2 N/A N/A Measurements L x W x D (cm) 0.55 N/A N/A A (cm) : rea  0.11 N/A N/A Volume (cm) : -100.00% N/A N/A % Reduction in A rea: 0.00% N/A N/A % Reduction in Volume: Grade 3 N/A N/A Classification: Medium N/A N/A Exudate A mount: Serosanguineous N/A N/A Exudate Type: red, brown N/A N/A Exudate Color: Distinct, outline attached N/A N/A Wound Margin: Small (1-33%) N/A N/A Granulation A mount: Red N/A N/A Granulation Quality: Medium (34-66%) N/A N/A Necrotic A mountZyree [name], [name] ([id number]) [id number] Page 3 of 8 Fat Layer (Subcutaneous Tissue): Yes N/A N/A Exposed Structures: Tendon: Yes Bone: Yes Fascia: No Muscle: No Joint: No Small (1-33%) N/A N/A Epithelialization: Debridement - Selective/Open Wound N/A N/A Debridement: Pre-procedure Verification/Time Out 11:05 N/A N/A Taken: Lidocaine 5% topical ointment N/A N/A Pain Control: Slough N/A N/A Tissue Debrided: Non-Viable Tissue N/A N/A Level: 0.7 N/A N/A Debridement A (sq cm): rea Curette N/A N/A Instrument: Minimum N/A N/A Bleeding: Pressure N/A N/A Hemostasis A chieved: 0 N/A N/A Procedural Pain: 0 N/A N/A Post Procedural Pain: Procedure was tolerated well N/A N/A Debridement Treatment Response: 0.7x1x0.2 N/A  N/A Post Debridement Measurements L x W x D (cm) 0.11 N/A N/A Post Debridement Volume: (cm) Callus: Yes N/A N/A Periwound Skin Texture: Maceration: Yes N/A N/A Periwound Skin Moisture: No Abnormalities Noted N/A N/A Periwound Skin Color: No Abnormality N/A N/A Temperature: Debridement N/A N/A Procedures Performed: Treatment Notes Wound #1 (Toe Great) Wound Laterality: Right, Medial Cleanser Soap and Water Discharge Instruction: May shower and wash wound with dial antibacterial soap and water prior to dressing change. Wound Cleanser Discharge Instruction: Cleanse the wound with wound cleanser prior to applying a clean dressing using gauze sponges, not tissue or cotton balls. Peri-Wound Care Topical Gentamicin Discharge Instruction: As directed by physician Primary Dressing Hydrofera Blue Ready Transfer Foam, 2.5x2.5 (in/in) Discharge Instruction: Apply directly to wound bed as directed Secondary Dressing Woven Gauze Sponge, Non-Sterile 4x4 in Discharge Instruction: Apply over primary dressing as directed. Secured With Conforming Stretch Gauze Bandage, Sterile 2x75 (in/in) Discharge Instruction: Secure with stretch gauze as directed. 20M Medipore Soft Cloth Surgical T 2x10 (in/yd) ape Discharge Instruction: Secure with tape as directed. Compression Wrap Compression Stockings Add-Ons Electronic Signature(s) Signed: 01/04/2023 12:06:07 PM By: [name] MD FACS Entered By: [name] on 01/04/2023 12:06:07 -------------------------------------------------------------------------------- Multi-Disciplinary Care Plan Details Patient Name: Date of Service: [facility], [name] [name] 01/04/2023 10:30 A M Medical Record Number: [id number] Patient Account Number: [id number] Date of Birth/Sex: Treating RN: 1950/10/15 (72 y.o. [name] [name] [place], [name] ([id number]) [id number] Page 4 of 8 Primary Care Idil Maslanka: [name] [name] [name]  Clinician: Referring Farron Lafond: Treating Jackston [place]/Extender: Gracy Racer in Treatment: 12 Multidisciplinary Care Plan reviewed with physician Active Inactive HBO Nursing Diagnoses: Potential for barotraumas to ears, sinuses, teeth, and lungs or cerebral gas embolism related to changes in atmospheric pressure inside hyperbaric oxygen chamber Potential for oxygen toxicity seizures related to delivery of 100% oxygen at an increased atmospheric pressure Potential for pulmonary oxygen toxicity related to delivery of 100% oxygen at an increased atmospheric pressure Goals: Barotrauma will be prevented during HBO2 Date Initiated: 12/28/2022 T arget Resolution Date: 01/25/2023 Goal Status: Active Patient and/or family will be able to state/discuss factors appropriate to the management of their disease process during treatment Date Initiated: 12/28/2022 T arget Resolution Date: 01/25/2023 Goal Status: Active Patient will tolerate the hyperbaric oxygen therapy treatment Date Initiated: 12/28/2022 T arget Resolution Date: 01/25/2023 Goal Status: Active Patient will tolerate the internal climate of the chamber Date Initiated: 12/28/2022 T arget Resolution Date: 01/25/2023 Goal Status: Active  Interventions: Administer the correct therapeutic gas delivery based on the patients needs and limitations, per physician order Assess and provide for patients comfort related to the hyperbaric environment and equalization of middle ear Assess for signs and symptoms related to adverse events, including but not limited to [name] [name], pneumothorax, oxygen toxicity and baurotrauma Assess patient for any history of [name] [name] Assess patient's knowledge and expectations regarding hyperbaric medicine and provide education related to the hyperbaric environment, goals of treatment and prevention of adverse events Implement protocols to decrease risk of pneumothorax in high risk  patients Notes: Nutrition Nursing Diagnoses: Impaired glucose control: actual or potential Goals: Patient/caregiver will maintain therapeutic glucose control Date Initiated: 10/12/2022 Target Resolution Date: 01/25/2023 Goal Status: Active Interventions: Provide education on elevated blood sugars and impact on wound healing Provide education on nutrition Treatment Activities: Dietary management education, guidance and counseling : 10/12/2022 Giving encouragement to exercise : 10/12/2022 Notes: Osteomyelitis Nursing Diagnoses: Infection: osteomyelitis Goals: Patient/caregiver will verbalize understanding of disease process and disease management Date Initiated: 10/12/2022 Target Resolution Date: 01/25/2023 Goal Status: Active Interventions: Assess for signs and symptoms of osteomyelitis resolution every visit Provide education on osteomyelitis Treatment Activities: [name] [name] [name] [name] ([id number]) [phone].pdf Page 5 of 8 MRI : 08/16/2022 Notes: Wound/Skin Impairment Nursing Diagnoses: Impaired tissue integrity Goals: Ulcer/skin breakdown will have a volume reduction of 30% by week 4 Date Initiated: 10/12/2022 Target Resolution Date: 01/25/2023 Goal Status: Active Interventions: Assess ulceration(s) every visit Provide education on smoking Treatment Activities: Skin care regimen initiated : 10/12/2022 Smoking cessation education : 10/12/2022 Notes: Electronic Signature(s) Signed: 01/04/2023 5:39:06 PM By: [name] Deed RN, BSN Entered By: [name] Deed on 01/04/2023 11:04:01 -------------------------------------------------------------------------------- Pain Assessment Details Patient Name: Date of Service: [name] [name] [name] [name] 01/04/2023 10:30 A M Medical Record Number: [id number] Patient Account Number: [id number] Date of Birth/Sex: Treating RN: [birth year] (72 y.o. [name] [name] Primary Care Preesha Benjamin: [name] [name] [name] Clinician: Referring  Dyesha Henault: Treating Hunter Pinkard/Extender: [name] Dinning [name] in Treatment: 12 Active Problems Location of Pain Severity and Description of Pain Patient Has Paino No Site Locations Rate the pain. Current Pain Level: 0 Pain Management and Medication Current Pain Management: Electronic Signature(s) Signed: 01/04/2023 5:39:06 PM By: [name] Deed RN, BSN Entered By: [name] Deed on 01/04/2023 10:51:48 [name] [name] [name] [name] ([id number]) [phone].pdf Page 6 of 8 -------------------------------------------------------------------------------- Patient/Caregiver Education Details Patient Name: Date of Service: [facility], [name] 4/4/2024andnbsp10:30 A M Medical Record Number: [id number] Patient Account Number: [id number] Date of Birth/Gender: Treating RN: 1951-06-16 (72 y.o. [name] [name] Primary Care Physician: [name] [name] [name] Clinician: Referring Physician: Treating Physician/Extender: Gracy Racer in Treatment: 12 Education Assessment Education Provided To: Patient Education Topics Provided Elevated Blood Sugar/ Impact on Healing: Methods: Explain/Verbal Responses: Reinforcements needed, State content correctly Hyperbaric Oxygenation: Methods: Explain/Verbal Responses: Reinforcements needed, State content correctly Wound/Skin Impairment: Methods: Explain/Verbal Responses: Reinforcements needed, State content correctly Electronic Signature(s) Signed: 01/04/2023 5:39:06 PM By: [name] Deed RN, BSN Entered By: [name] Deed on 01/04/2023 11:04:29 -------------------------------------------------------------------------------- Wound Assessment Details Patient Name: Date of Service: [facility] [name], [name] [name] 01/04/2023 10:30 A M Medical Record Number: [id number] Patient Account Number: [id number] Date of Birth/Sex: Treating RN: 1951-05-10 (72 y.o. [name] [name] Primary Care [place]: [name] [name] [name] Clinician: Referring  Nyzaiah Kai: Treating Haiden Clucas/Extender: [name] Dinning [name] in Treatment: 12 Wound Status Wound Number: 1 Primary Diabetic Wound/Ulcer of the Lower Extremity Etiology: Wound Location: Right, Medial T Great oe Secondary Acute Osteomyelitis Wounding Event: Pressure Injury Etiology: Date Acquired: 08/16/2022 Wound Open  [name] Of Treatment: 12 Status: Clustered Wound: No Comorbid Cataracts, Hypertension, Type II Diabetes, Osteomyelitis, History: Neuropathy, [name] [name] Photos Wound Measurements Length: (cm) Width: (cm) Depth: (cm) Vonseggern, Odysseus ([id number]) Area: (cm) Volume: (cm) 0.7 % Reduction in Area: -100% 1 % Reduction in Volume: 0% 0.2 Epithelialization: Small (1-33%) [id number] Page 7 of 8 0.55 Tunneling: No 0.11 Undermining: No Wound Description Classification: Grade 3 Wound Margin: Distinct, outline attached Exudate Amount: Medium Exudate Type: Serosanguineous Exudate Color: red, brown Foul Odor After Cleansing: No Slough/Fibrino Yes Wound Bed Granulation Amount: Small (1-33%) Exposed Structure Granulation Quality: Red Fascia Exposed: No Necrotic Amount: Medium (34-66%) Fat Layer (Subcutaneous Tissue) Exposed: Yes Necrotic Quality: Adherent Slough Tendon Exposed: Yes Muscle Exposed: No Joint Exposed: No Bone Exposed: Yes Periwound Skin Texture Texture Color No Abnormalities Noted: Yes No Abnormalities Noted: Yes Moisture Temperature / Pain No Abnormalities Noted: Yes Temperature: No Abnormality Treatment Notes Wound #1 (Toe Great) Wound Laterality: Right, Medial Cleanser Soap and Water Discharge Instruction: May shower and wash wound with dial antibacterial soap and water prior to dressing change. Wound Cleanser Discharge Instruction: Cleanse the wound with wound cleanser prior to applying a clean dressing using gauze sponges, not tissue or cotton balls. Peri-Wound  Care Topical Gentamicin Discharge Instruction: As directed by physician Primary Dressing Hydrofera Blue Ready Transfer Foam, 2.5x2.5 (in/in) Discharge Instruction: Apply directly to wound bed as directed Secondary Dressing Woven Gauze Sponge, Non-Sterile 4x4 in Discharge Instruction: Apply over primary dressing as directed. Secured With Conforming Stretch Gauze Bandage, Sterile 2x75 (in/in) Discharge Instruction: Secure with stretch gauze as directed. 39M Medipore Soft Cloth Surgical T 2x10 (in/yd) ape Discharge Instruction: Secure with tape as directed. Compression Wrap Compression Stockings Add-Ons Electronic Signature(s) Signed: 01/04/2023 5:39:06 PM By: [name] [name], Linda RN, BSN Entered By: [name] [name], [name] on 01/04/2023 10:59:43 -------------------------------------------------------------------------------- Vitals Details [facility]: [name] 01/04/2023 10:30 A M Medical Record Number: [id number] Patient Account Number: [id number] Date of Birth/Sex: Treating RN: 08-Nov-1950 (72 y.o. [name] SchoonerM) [name] [name] [name] [name] [name] [name] ([id number]) [id number] Page 8 of 8 Primary Care Alivea Gladson: [name] [name], [name] [name] Clinician: Referring Arvis Miguez: Treating Samaia Iwata/Extender: [name] [name], [name] [name], [name] [name] in Treatment: 12 Vital Signs Time Taken: 10:50 Temperature (F): 97.8 Height (in): 72 Pulse (bpm): 96 Weight (lbs): 185 Respiratory Rate (breaths/min): 18 Body Mass Index (BMI): 25.1 Blood Pressure (mmHg): 190/75 Reference Range: 80 - 120 mg / dl Notes pt reports has not check blood sugar in several days Electronic Signature(s) Signed: 01/04/2023 5:39:06 PM By: [name] [name], Linda RN, BSN Entered By: [name] [name], [name] on 01/04/2023 10:51:38

## 2023-01-05 NOTE — Progress Notes (Signed)
[name], [name] ([id number]) [id number] Page 1 of 10 Visit Report for 01/04/2023 Chief Complaint Document Details Patient Name: Date of Service: [facility] [name], [name] 01/04/2023 10:30 A M Medical Record Number: [id number] Patient Account Number: [id number] Date of Birth/Sex: Treating RN: 1950/11/01 (73 y.o. M) Primary Care Provider: [name] Other Other Clinician: Referring Provider: Treating Provider/Extender: [name] Weeks in Treatment: 12 Information Obtained from: Patient Chief Complaint Patients presents for treatment of an open diabetic ulcer on the right great toe Electronic Signature(s) Signed: 01/04/2023 12:06:14 PM By: [name] Guess MD FACS Entered By: [name] on 01/04/2023 12:06:14 -------------------------------------------------------------------------------- Debridement Details Patient Name: Date of Service: [name], [name] 01/04/2023 10:30 A M Medical Record Number: [id number] Patient Account Number: [id number] Date of Birth/Sex: Treating RN: 1950-11-01 (72 y.o. [name] Primary Care Provider: [name] Other Other Clinician: Referring Provider: Treating Provider/Extender: [name] Weeks in Treatment: 12 Debridement Performed for Assessment: Wound #1 Right,Medial T Great oe Performed By: Physician [name] Guess, MD Debridement Type: Debridement Severity of Tissue Pre Debridement: Necrosis of bone Level of Consciousness (Pre-procedure): Awake and Alert Pre-procedure Verification/Time Out Yes - 11:05 Taken: Start Time: 11:08 Pain Control: Lidocaine 5% topical ointment T Area Debrided (L x W): otal 0.7 (cm) x 1 (cm) = 0.7 (cm) Tissue and other material debrided: Non-Viable, Slough, Slough Level: Non-Viable Tissue Debridement Description: Selective/Open Wound Instrument: Curette Bleeding: Minimum Hemostasis Achieved: Pressure Procedural Pain: 0 Post Procedural Pain: 0 Response to  Treatment: Procedure was tolerated well Level of Consciousness (Post- Awake and Alert procedure): Post Debridement Measurements of Total Wound Length: (cm) 0.7 Width: (cm) 1 Depth: (cm) 0.2 Volume: (cm) 0.11 Character of Wound/Ulcer Post Debridement: Stable Severity of Tissue Post Debridement: Necrosis of bone Post Procedure Diagnosis Same as Pre-procedure Notes scribed for Dr. Lady Gary by Zenaida Deed, RN Electronic Signature(s) Signed: 01/04/2023 12:44:03 PM By: [name] Guess MD FACS [place], [name] ([id number]) [id number] Page 2 of 10 Signed: 01/04/2023 5:39:06 PM By: Zenaida Deed RN, BSN Entered By: Zenaida Deed on 01/04/2023 11:10:43 -------------------------------------------------------------------------------- HPI Details Patient Name: Date of Service: [facility] [name], [name] 01/04/2023 10:30 A M Medical Record Number: [id number] Patient Account Number: [id number] Date of Birth/Sex: Treating RN: Apr 29, 1951 (72 y.o. M) Primary Care Provider: [name] Other Other Clinician: Referring Provider: Treating Provider/Extender: [name] Weeks in Treatment: 12 History of Present Illness HPI Description: ADMISSION 10/12/2022 This is a 72 year old type II diabetic (last hemoglobin A1c 7.7 in October 2023) with stage V chronic kidney disease and hypertension. He has been followed by podiatry for an ulcer on his right great toe. He has a history of prior toe and foot infections requiring amputation. In November 2023, he had an outpatient MRI that demonstrated osteomyelitis. He was admitted to the hospital for IV antibiotics and subsequently underwent irrigation and debridement of the toe with biopsy and placement of married 3 layer matrix. Cultures grew out MRSA and osteomyelitis was seen on pathology. He was followed by infectious disease and given a course of doxycycline and Augmentin. He completed this course at the end of December. He has been  evaluated by vascular surgery with formal ABIs as well as consultation. He does have adequate blood flow at least to the dorsalis pedis. He last saw podiatry on January 4 of this year where he underwent debridement. It was felt he would benefit from specialized wound care and he was referred to our center for further evaluation and management. On the dorsal aspect of his right great toe,  there is a circular ulcer. There is a fair amount of undermining from callus and dry skin. The surface is a bit pale and fibrotic, but there is no obvious necrosis. 10/19/2022: He has been approved by his insurance to undergo hyperbaric oxygen therapy. EKG has been obtained and is normal. We are still waiting on his chest x-ray. The wound is clean with just a little bit of periwound callus and slough on the surface. Tendon is exposed. 10/26/2022: Unfortunately, the required co-pay from his insurance is cost prohibitive for him and he will not be able to receive hyperbaric oxygen therapy. Some moisture got under the dry skin around his wound and lifted it off but there is good epithelialized tissue beneath this. The exposed tendon has deteriorated. 11/03/2022: After some discussion with the financial office, it has become feasible for the patient to undergo hyperbaric oxygen therapy and he has received his first treatments this week. The wound is a little bit smaller and the tissue has a better color to it, but there is still extensive nonviable subcutaneous tissue and slough present. 11/09/2022: The wound measured a little bit smaller today. There is still slough and nonviable subcu tissue and tendon present. 11/16/2022: The wound continues to contract. There is still substantial depth to it, however, and bone remains exposed at the base. This is also a (early) 30-day update for HBO. 11/27/2022: Unfortunately, the wound has deteriorated. It appears that the tissue has become macerated. There is a second ulcer on the lateral  aspect of his great toe. The nail is lifting off of the bed and there is wet skin lifting off of the underlying tissue. The initial wound is smaller but still has a fair amount of depth to it. The patient does report quite a bit more drainage since his last visit. 12/20/2022: For scheduling reasons, somehow we have missed having a wound care visit in between his hyperbaric oxygen therapy treatments. The 2 openings of the wound have converged into 1 large site. There is necrotic tendon present at the lateral aspect of the great toe. The bone is visible, including the joint space. The area of the initial wound is flush with the surrounding skin. No malodor or purulent drainage. 12/28/2022: The wound is vastly improved this week. The 2 areas of the wound have separated due to tissue bridging and healing the space between. There is no more necrotic tissue visible, although the bone/joint space remains exposed. 01/04/2023: His wound continues to improve. The more medial aspect of the toe is nearly closed. Bone remains exposed laterally. He has been approved for Epicord. He is going to start dialysis next week. Electronic Signature(s) Signed: 01/04/2023 12:07:00 PM By: [name] Guess MD FACS Entered By: [name] on 01/04/2023 12:07:00 -------------------------------------------------------------------------------- Physical Exam Details Patient Name: Date of Service: [name], [name] 01/04/2023 10:30 A M Medical Record Number: [id number] Patient Account Number: [id number] Date of Birth/Sex: Treating RN: [birth year] (72 y.o. M) Primary Care Provider: [name] Other Other Clinician: Referring Provider: Treating Provider/Extender: [name] Weeks in Treatment: 12 Constitutional Hypertensive, asymptomatic. . . . no acute distress. Respiratory [name], [name] ([id number]) [id number] Page 3 of 10 Normal work of breathing on room air. Notes 01/04/2023: His  wound continues to improve. The more medial aspect of the toe is nearly closed. Bone remains exposed laterally. Electronic Signature(s) Signed: 01/04/2023 12:07:30 PM By: [name] Guess MD FACS Entered By: [name] on 01/04/2023 12:07:30 -------------------------------------------------------------------------------- Physician Orders Details Patient Name: Date of Service: [facility] [name],  [name] 01/04/2023 10:30 A M Medical Record Number: [id number] Patient Account Number: [id number] Date of Birth/Sex: Treating RN: 10/20/1950 (72 y.o. [name] Primary Care Provider: [name] Other Other Clinician: Referring Provider: Treating Provider/Extender: [name] Weeks in Treatment: 12 Verbal / Phone Orders: No Diagnosis Coding ICD-10 Coding Code Description L97.514 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.621 Type 2 diabetes mellitus with foot ulcer N18.5 Chronic kidney disease, stage 5 M86.9 Osteomyelitis, unspecified E11.42 Type 2 diabetes mellitus with diabetic polyneuropathy I10 Essential (primary) hypertension Follow-up Appointments ppointment in 1 week. - Dr. Lady Gary Rm 1 Return A Thursday 4/1 @ 8:15 am Anesthetic Wound #1 Right,Medial T Great oe (In clinic) Topical Lidocaine 4% applied to wound bed Cellular or Tissue Based Products Cellular or Tissue Based Product Type: - plan epicord next week Bathing/ Shower/ Hygiene May shower and wash wound with soap and water. Off-Loading Wound #1 Right,Medial T Great oe Other: - Diabetic shoe Hyperbaric Oxygen Therapy Evaluate for HBO Therapy Indication: - chronic refractory osteomyelitis If appropriate for treatment, begin HBOT per protocol: 2.5 ATA for 90 Minutes with 2 Five (5) Minute A Breaks ir Total Number of Treatments: - 40 plus 40 (12/20/22) One treatments per day (delivered Monday through Friday unless otherwise specified in Special Instructions  below): Finger stick Blood Glucose Pre- and Post- HBOT Treatment. Follow Hyperbaric Oxygen Glycemia Protocol Give two 4oz orange juices in addition to Glucerna when the glycemic protocol is used. A frin (Oxymetazoline HCL) 0.05% nasal spray - 1 spray in both nostrils daily as needed prior to HBO treatment for difficulty clearing ears Wound Treatment Wound #1 - T Great oe Wound Laterality: Right, Medial Cleanser: Soap and Water 1 x Per Day/30 Days Discharge Instructions: May shower and wash wound with dial antibacterial soap and water prior to dressing change. Cleanser: Wound Cleanser 1 x Per Day/30 Days Discharge Instructions: Cleanse the wound with wound cleanser prior to applying a clean dressing using gauze sponges, not tissue or cotton balls. Topical: Gentamicin 1 x Per Day/30 Days Discharge Instructions: As directed by physician Glorious Peach ([id number]) [id number] Page 4 of 10 Prim Dressing: Hydrofera Blue Ready Transfer Foam, 2.5x2.5 (in/in) (Generic) 1 x Per Day/30 Days ary Discharge Instructions: Apply directly to wound bed as directed Secondary Dressing: Woven Gauze Sponge, Non-Sterile 4x4 in 1 x Per Day/30 Days Discharge Instructions: Apply over primary dressing as directed. Secured With: Insurance underwriter, Sterile 2x75 (in/in) 1 x Per Day/30 Days Discharge Instructions: Secure with stretch gauze as directed. Secured With: 77M Medipore Scientist, research (life sciences) Surgical T 2x10 (in/yd) 1 x Per Day/30 Days ape Discharge Instructions: Secure with tape as directed. GLYCEMIA INTERVENTIONS PROTOCOL PRE-HBO GLYCEMIA INTERVENTIONS ACTION INTERVENTION Obtain pre-HBO capillary blood glucose (ensure 1 physician order is in chart). A. Notify HBO physician and await physician orders. 2 If result is 70 mg/dl or below: B. If the result meets the hospital definition of a critical result, follow hospital policy. A. Give patient an 8 ounce Glucerna Shake,  an 8 ounce Ensure, or 8 ounces of a Glucerna/Ensure equivalent dietary supplement*. B. Wait 30 minutes. If result is 71 mg/dl to 782 mg/dl: C. Retest patients capillary blood glucose (CBG). D. If result greater than or equal to 110 mg/dl, proceed with HBO. If result less than 110 mg/dl, notify HBO physician and consider holding HBO. If result is 131 mg/dl to 956 mg/dl: A. Proceed with HBO. A. Notify HBO physician and await physician orders.  B. It is recommended to hold HBO and do If result is 250 mg/dl or greater: blood/urine ketone testing. C. If the result meets the hospital definition of a critical result, follow hospital policy. POST-HBO GLYCEMIA INTERVENTIONS ACTION INTERVENTION Obtain post HBO capillary blood glucose (ensure 1 physician order is in chart). A. Notify HBO physician and await physician orders. 2 If result is 70 mg/dl or below: B. If the result meets the hospital definition of a critical result, follow hospital policy. A. Give patient an 8 ounce Glucerna Shake, an 8 ounce Ensure, or 8 ounces of a Glucerna/Ensure equivalent dietary supplement*. B. Wait 15 minutes for symptoms of If result is 71 mg/dl to 482 mg/dl: hypoglycemia (i.e. nervousness, anxiety, sweating, chills, clamminess, irritability, confusion, tachycardia or dizziness). C. If patient asymptomatic, discharge patient. If patient symptomatic, repeat capillary blood glucose (CBG) and notify HBO physician. If result is 101 mg/dl to 707 mg/dl: A. Discharge patient. A. Notify HBO physician and await physician orders. B. It is recommended to do blood/urine ketone If result is 250 mg/dl or greater: testing. C. If the result meets the hospital definition of a critical result, follow hospital policy. *Juice or candies are NOT equivalent products. If patient refuses the Glucerna or Ensure, please consult the hospital dietitian for an appropriate substitute. Electronic Signature(s) Signed:  01/04/2023 12:44:03 PM By: [name] Guess MD FACS Entered By: [name] on 01/04/2023 12:07:44 -------------------------------------------------------------------------------- Problem List Details Patient Name: Date of Service: [name], [name] 01/04/2023 10:30 A M Medical Record Number: [id number] Patient Account Number: [id number] Date of Birth/Sex: Treating RN: 1951-07-28 (72 y.o. [name] Primary Care Provider: [name] Other Other Clinician: Glorious Peach ([id number]) [id number] Page 5 of 10 Referring Provider: Treating Provider/Extender: [name] Weeks in Treatment: 12 Active Problems ICD-10 Encounter Code Description Active Date MDM Diagnosis L97.514 Non-pressure chronic ulcer of other 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 10/12/2022 No Yes E11.621 Type 2 diabetes mellitus with foot ulcer 10/12/2022 No Yes N18.5 Chronic kidney disease, stage 5 10/12/2022 No Yes M86.9 Osteomyelitis, unspecified 10/12/2022 No Yes E11.42 Type 2 diabetes mellitus with diabetic polyneuropathy 10/12/2022 No Yes I10 Essential (primary) hypertension 10/12/2022 No Yes Inactive Problems Resolved Problems Electronic Signature(s) Signed: 01/04/2023 12:06:00 PM By: [name] Guess MD FACS Entered By: [name] on 01/04/2023 12:05:59 -------------------------------------------------------------------------------- Progress Note Details Patient Name: Date of Service: [facility] [name], [name] 01/04/2023 10:30 A M Medical Record Number: [id number] Patient Account Number: [id number] Date of Birth/Sex: Treating RN: 1951/05/11 (72 y.o. M) Primary Care Provider: [name] Other Other Clinician: Referring Provider: Treating Provider/Extender: [name] Weeks in Treatment: 12 Subjective Chief Complaint Information obtained from Patient Patients presents for treatment of an open diabetic ulcer on the right great toe History of Present  Illness (HPI) ADMISSION 10/12/2022 This is a 72 year old type II diabetic (last hemoglobin A1c 7.7 in October 2023) with stage V chronic kidney disease and hypertension. He has been followed by podiatry for an ulcer on his right great toe. He has a history of prior toe and foot infections requiring amputation. In November 2023, he had an outpatient MRI that demonstrated osteomyelitis. He was admitted to the hospital for IV antibiotics and subsequently underwent irrigation and debridement of the toe with biopsy and placement of married 3 layer matrix. Cultures grew out MRSA and osteomyelitis was seen on pathology. He was followed by infectious disease and given a course of doxycycline and Augmentin. He completed this course at the end of December. He has  been evaluated by vascular surgery with formal ABIs as well as consultation. He does have adequate blood flow at least to the dorsalis pedis. He last saw podiatry on January 4 of this year where he underwent debridement. It was felt he would benefit from specialized wound care and he was referred to our center for further evaluation and management. On the dorsal aspect of his right great toe, there is a circular ulcer. There is a fair amount of undermining from callus and dry skin. The surface is a bit pale and fibrotic, but there is no obvious necrosis. 10/19/2022: He has been approved by his insurance to undergo hyperbaric oxygen therapy. EKG has been obtained and is normal. We are still waiting on his chest x-ray. The wound is clean with just a little bit of periwound callus and slough on the surface. Tendon is exposed. [name], [name] ([id number]) [id number] Page 6 of 10 10/26/2022: Unfortunately, the required co-pay from his insurance is cost prohibitive for him and he will not be able to receive hyperbaric oxygen therapy. Some moisture got under the dry skin around his wound and lifted it off but there is good  epithelialized tissue beneath this. The exposed tendon has deteriorated. 11/03/2022: After some discussion with the financial office, it has become feasible for the patient to undergo hyperbaric oxygen therapy and he has received his first treatments this week. The wound is a little bit smaller and the tissue has a better color to it, but there is still extensive nonviable subcutaneous tissue and slough present. 11/09/2022: The wound measured a little bit smaller today. There is still slough and nonviable subcu tissue and tendon present. 11/16/2022: The wound continues to contract. There is still substantial depth to it, however, and bone remains exposed at the base. This is also a (early) 30-day update for HBO. 11/27/2022: Unfortunately, the wound has deteriorated. It appears that the tissue has become macerated. There is a second ulcer on the lateral aspect of his great toe. The nail is lifting off of the bed and there is wet skin lifting off of the underlying tissue. The initial wound is smaller but still has a fair amount of depth to it. The patient does report quite a bit more drainage since his last visit. 12/20/2022: For scheduling reasons, somehow we have missed having a wound care visit in between his hyperbaric oxygen therapy treatments. The 2 openings of the wound have converged into 1 large site. There is necrotic tendon present at the lateral aspect of the great toe. The bone is visible, including the joint space. The area of the initial wound is flush with the surrounding skin. No malodor or purulent drainage. 12/28/2022: The wound is vastly improved this week. The 2 areas of the wound have separated due to tissue bridging and healing the space between. There is no more necrotic tissue visible, although the bone/joint space remains exposed. 01/04/2023: His wound continues to improve. The more medial aspect of the toe is nearly closed. Bone remains exposed laterally. He has been approved  for Epicord. He is going to start dialysis next week. Patient History Information obtained from Patient. Family History Diabetes - Mother,Siblings, Heart Disease - Siblings, Hypertension - Siblings, Seizures - Father, Stroke - Siblings, No family history of Cancer, Hereditary Spherocytosis, Kidney Disease, Lung Disease, Thyroid Problems, Tuberculosis. Social History Former smoker, Marital Status - Married, Alcohol Use - Rarely, Drug Use - No History, Caffeine Use - Daily. Medical History Eyes Patient has history of Cataracts - rt  eye Ear/Nose/Mouth/Throat Denies history of Chronic sinus problems/congestion Hematologic/Lymphatic Denies history of Anemia, Hemophilia, Human Immunodeficiency Virus, Lymphedema, Sickle Cell Disease Respiratory Denies history of Aspiration, Asthma, Chronic Obstructive Pulmonary Disease (COPD), Pneumothorax, Tuberculosis Cardiovascular Patient has history of Hypertension Gastrointestinal Denies history of Cirrhosis , Colitis, Crohnoos, Hepatitis A, Hepatitis B, Hepatitis C Endocrine Patient has history of Type II Diabetes Genitourinary Denies history of End Stage Renal Disease Immunological Denies history of Lupus Erythematosus, Raynaudoos, Scleroderma Integumentary (Skin) Denies history of History of Burn Musculoskeletal Patient has history of Osteomyelitis - MRI confirmed (08/16/22) Denies history of Gout Neurologic Patient has history of Neuropathy Psychiatric Patient has history of Confinement Anxiety Denies history of Anorexia/bulimia Hospitalization/Surgery History - AV fistula place left upper arm 10/20/22. Medical A Surgical History Notes nd Genitourinary CKD IV Objective Constitutional Hypertensive, asymptomatic. no acute distress. Vitals Time Taken: 10:50 AM, Height: 72 in, Weight: 185 lbs, BMI: 25.1, Temperature: 97.8 F, Pulse: 96 bpm, Respiratory Rate: 18 breaths/min, Blood Pressure: 190/75 mmHg. General Notes: pt reports has  not check blood sugar in several days [name], [name] ([id number]) [phone].pdf Page 7 of 10 Respiratory Normal work of breathing on room air. General Notes: 01/04/2023: His wound continues to improve. The more medial aspect of the toe is nearly closed. Bone remains exposed laterally. Integumentary (Hair, Skin) Wound #1 status is Open. Original cause of wound was Pressure Injury. The date acquired was: 08/16/2022. The wound has been in treatment 12 weeks. The wound is located on the Right,Medial T Great. The wound measures 0.7cm length x 1cm width x 0.2cm depth; 0.55cm^2 area and 0.11cm^3 volume. There is oe bone, tendon, and Fat Layer (Subcutaneous Tissue) exposed. There is no tunneling or undermining noted. There is a medium amount of serosanguineous drainage noted. The wound margin is distinct with the outline attached to the wound base. There is small (1-33%) red granulation within the wound bed. There is a medium (34-66%) amount of necrotic tissue within the wound bed including Adherent Slough. The periwound skin appearance had no abnormalities noted for texture. The periwound skin appearance had no abnormalities noted for moisture. The periwound skin appearance had no abnormalities noted for color. Periwound temperature was noted as No Abnormality. Assessment Active Problems ICD-10 Non-pressure chronic ulcer of other 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 Type 2 diabetes mellitus with foot ulcer Chronic kidney disease, stage 5 Osteomyelitis, unspecified Type 2 diabetes mellitus with diabetic polyneuropathy Essential (primary) hypertension Procedures Wound #1 Pre-procedure diagnosis of Wound #1 is a Diabetic Wound/Ulcer of the Lower Extremity located on the Right,Medial T Great .Severity of Tissue Pre oe Debridement is: Necrosis of bone. There was a Selective/Open Wound Non-Viable Tissue Debridement with a total area of 0.7 sq cm performed by  [name] Guess, MD. With the following instrument(s): Curette to remove Non-Viable tissue/material. Material removed includes [facility] after achieving pain control using Lidocaine 5% topical ointment. No specimens were taken. A time out was conducted at 11:05, prior to the start of the procedure. A Minimum amount of bleeding was controlled with Pressure. The procedure was tolerated well with a pain level of 0 throughout and a pain level of 0 following the procedure. Post Debridement Measurements: 0.7cm length x 1cm width x 0.2cm depth; 0.11cm^3 volume. Character of Wound/Ulcer Post Debridement is stable. Severity of Tissue Post Debridement is: Necrosis of bone. Post procedure Diagnosis Wound #1: Same as Pre-Procedure General Notes: scribed for Dr. Lady Gary by Zenaida Deed, RN. Plan Follow-up Appointments: Return Appointment in 1 week. - Dr. Lady Gary  Rm 1 Thursday 4/1 @ 8:15 am Anesthetic: Wound #1 Right,Medial T Great: oe (In clinic) Topical Lidocaine 4% applied to wound bed Cellular or Tissue Based Products: Cellular or Tissue Based Product Type: - plan epicord next week Bathing/ Shower/ Hygiene: May shower and wash wound with soap and water. Off-Loading: Wound #1 Right,Medial T Great: oe Other: - Diabetic shoe Hyperbaric Oxygen Therapy: Evaluate for HBO Therapy Indication: - chronic refractory osteomyelitis If appropriate for treatment, begin HBOT per protocol: 2.5 ATA for 90 Minutes with 2 Five (5) Minute Air Breaks T Number of Treatments: - 40 plus 40 (12/20/22) otal One treatments per day (delivered Monday through Friday unless otherwise specified in Special Instructions below): Finger stick Blood Glucose Pre- and Post- HBOT Treatment. Follow Hyperbaric Oxygen Glycemia Protocol Give two 4oz orange juices in addition to Glucerna when the glycemic protocol is used. Afrin (Oxymetazoline HCL) 0.05% nasal spray - 1 spray in both nostrils daily as needed prior to HBO treatment for  difficulty clearing ears WOUND #1: - T Great Wound Laterality: Right, Medial oe Cleanser: Soap and Water 1 x Per Day/30 Days Discharge Instructions: May shower and wash wound with dial antibacterial soap and water prior to dressing change. Cleanser: Wound Cleanser 1 x Per Day/30 Days Discharge Instructions: Cleanse the wound with wound cleanser prior to applying a clean dressing using gauze sponges, not tissue or cotton balls. Topical: Gentamicin 1 x Per Day/30 Days Discharge Instructions: As directed by physician Prim Dressing: Hydrofera Blue Ready Transfer Foam, 2.5x2.5 (in/in) (Generic) 1 x Per Day/30 Days ary Discharge Instructions: Apply directly to wound bed as directed [facility], Mar ([id number]) [phone].pdf Page 8 of 10 Secondary Dressing: Woven Gauze Sponge, Non-Sterile 4x4 in 1 x Per Day/30 Days Discharge Instructions: Apply over primary dressing as directed. Secured With: Insurance underwriter, Sterile 2x75 (in/in) 1 x Per Day/30 Days Discharge Instructions: Secure with stretch gauze as directed. Secured With: 48M [employer] Scientist, research (life sciences) Surgical T 2x10 (in/yd) 1 x Per Day/30 Days ape Discharge Instructions: Secure with tape as directed. 01/04/2023: His wound continues to improve. The more medial aspect of the toe is nearly closed. Bone remains exposed laterally. I used a curette to debride slough from the wound. We will continue topical gentamicin with Hydrofera Blue. We will order Epicord for application next week. He will continue hyperbaric oxygen therapy. We will need to coordinate this with his dialysis sessions. Electronic Signature(s) Signed: 01/04/2023 12:10:29 PM By: [name] Guess MD FACS Entered By: [name] on 01/04/2023 12:10:28 -------------------------------------------------------------------------------- HxROS Details Patient Name: Date of Service: [facility] [name], [name] 01/04/2023 10:30 A M Medical Record Number:  [id number] Patient Account Number: [id number] Date of Birth/Sex: Treating RN: Jul 30, 1951 (72 y.o. M) Primary Care Provider: [name] Other Other Clinician: Referring Provider: Treating Provider/Extender: [name] Weeks in Treatment: 12 Information Obtained From Patient Eyes Medical History: Positive for: Cataracts - rt eye Ear/Nose/Mouth/Throat Medical History: Negative for: Chronic sinus problems/congestion Hematologic/Lymphatic Medical History: Negative for: Anemia; Hemophilia; Human Immunodeficiency Virus; Lymphedema; Sickle Cell Disease Respiratory Medical History: Negative for: Aspiration; Asthma; Chronic Obstructive Pulmonary Disease (COPD); Pneumothorax; Tuberculosis Cardiovascular Medical History: Positive for: Hypertension Gastrointestinal Medical History: Negative for: Cirrhosis ; Colitis; Crohns; Hepatitis A; Hepatitis B; Hepatitis C Endocrine Medical History: Positive for: Type II Diabetes Time with diabetes: 2000 Treated with: Insulin Blood sugar tested every day: Yes Tested : Genitourinary Medical History: Negative for: End Stage Renal Disease [name], [name] ([id number]) [phone].pdf Page 9 of 10 Past Medical History Notes: CKD IV Immunological  Medical History: Negative for: Lupus Erythematosus; Raynauds; Scleroderma Integumentary (Skin) Medical History: Negative for: History of Burn Musculoskeletal Medical History: Positive for: Osteomyelitis - MRI confirmed (08/16/22) Negative for: Gout Neurologic Medical History: Positive for: Neuropathy Psychiatric Medical History: Positive for: Confinement Anxiety Negative for: Anorexia/bulimia HBO Extended History Items Eyes: Cataracts Immunizations Pneumococcal Vaccine: Received Pneumococcal Vaccination: Yes Received Pneumococcal Vaccination On or After 60th Birthday: Yes Implantable Devices None Hospitalization / Surgery History Type of  Hospitalization/Surgery AV fistula place left upper arm 10/20/22 Family and Social History Cancer: No; Diabetes: Yes - Mother,Siblings; Heart Disease: Yes - Siblings; Hereditary Spherocytosis: No; Hypertension: Yes - Siblings; Kidney Disease: No; Lung Disease: No; Seizures: Yes - Father; Stroke: Yes - Siblings; Thyroid Problems: No; Tuberculosis: No; Former smoker; Marital Status - Married; Alcohol Use: Rarely; Drug Use: No History; Caffeine Use: Daily; Financial Concerns: No; Food, Clothing or Shelter Needs: No; Support System Lacking: No; Transportation Concerns: No Electronic Signature(s) Signed: 01/04/2023 12:44:03 PM By: [name] Guessannon, Decie Verne MD FACS Entered By: [name] Guessannon, Kara Melching on 01/04/2023 12:07:08 -------------------------------------------------------------------------------- SuperBill Details [name] 01/04/2023 Medical Record Number: [id number] Patient Account Number: [id number] Date of Birth/Sex: Treating RN: March 12, 1951 (72 y.o. M) Primary Care Provider: [name] Otherook, Jayce Other Clinician: Referring Provider: Treating Provider/Extender: Thomes Dinningannon, Marlena Barbato Cook, Jayce Weeks in Treatment: 12 Diagnosis Coding ICD-10 Codes Code Description L97.514 Non-pressure chronic ulcer of other 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 Bruemmer, [name] ([id number]) [id number].pdf Page 10 of 10 E11.621 Type 2 diabetes mellitus with foot ulcer N18.5 Chronic kidney disease, stage 5 M86.9 Osteomyelitis, unspecified E11.42 Type 2 diabetes mellitus with diabetic polyneuropathy I10 Essential (primary) hypertension Facility Procedures : CPT4 Code: [id number] Description: [phone] - DEBRIDE WOUND 1ST 20 SQ CM OR < ICD-10 Diagnosis Description L97.514 Non-pressure chronic ulcer of other 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 Modifier: Quantity: 1 Physician Procedures : CPT4 Code Description Modifier [id number] - WC PHYS LEVEL 4 - EST PT 25 ICD-10  Diagnosis Description L97.514 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.621 Type 2 diabetes mellitus with foot ulcer M86.9  Osteomyelitis, unspecified E11.42 Type 2 diabetes mellitus with diabetic polyneuropathy Quantity: 1 : [id number] - WC PHYS DEBR WO ANESTH 20 SQ CM ICD-10 Diagnosis Description L97.514 Non-pressure chronic ulcer of other 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 Quantity: 1 Electronic Signature(s) Signed: 01/04/2023 12:11:32 PM By: [name] Guessannon, Madeline Bebout MD FACS Entered By: [name] Guessannon, Aubreigh Fuerte on 01/04/2023 12:11:32

## 2023-01-08 ENCOUNTER — Encounter (HOSPITAL_BASED_OUTPATIENT_CLINIC_OR_DEPARTMENT_OTHER): Payer: No Typology Code available for payment source | Admitting: General Surgery

## 2023-01-08 DIAGNOSIS — E1142 Type 2 diabetes mellitus with diabetic polyneuropathy: Secondary | ICD-10-CM | POA: Diagnosis not present

## 2023-01-08 DIAGNOSIS — E11621 Type 2 diabetes mellitus with foot ulcer: Secondary | ICD-10-CM | POA: Diagnosis not present

## 2023-01-08 DIAGNOSIS — I10 Essential (primary) hypertension: Secondary | ICD-10-CM | POA: Diagnosis not present

## 2023-01-08 DIAGNOSIS — N185 Chronic kidney disease, stage 5: Secondary | ICD-10-CM | POA: Diagnosis not present

## 2023-01-08 DIAGNOSIS — L97512 Non-pressure chronic ulcer of other part of right foot with fat layer exposed: Secondary | ICD-10-CM | POA: Diagnosis not present

## 2023-01-08 DIAGNOSIS — M869 Osteomyelitis, unspecified: Secondary | ICD-10-CM | POA: Diagnosis not present

## 2023-01-09 ENCOUNTER — Encounter (HOSPITAL_BASED_OUTPATIENT_CLINIC_OR_DEPARTMENT_OTHER): Payer: No Typology Code available for payment source | Admitting: General Surgery

## 2023-01-10 ENCOUNTER — Encounter: Payer: Self-pay | Admitting: Internal Medicine

## 2023-01-10 ENCOUNTER — Other Ambulatory Visit: Payer: Self-pay

## 2023-01-10 ENCOUNTER — Ambulatory Visit (INDEPENDENT_AMBULATORY_CARE_PROVIDER_SITE_OTHER): Payer: No Typology Code available for payment source

## 2023-01-10 ENCOUNTER — Encounter (HOSPITAL_BASED_OUTPATIENT_CLINIC_OR_DEPARTMENT_OTHER): Payer: No Typology Code available for payment source | Admitting: General Surgery

## 2023-01-10 ENCOUNTER — Ambulatory Visit (INDEPENDENT_AMBULATORY_CARE_PROVIDER_SITE_OTHER): Payer: No Typology Code available for payment source | Admitting: Internal Medicine

## 2023-01-10 VITALS — BP 167/73 | HR 97 | Resp 16 | Ht 72.0 in | Wt 193.0 lb

## 2023-01-10 DIAGNOSIS — M869 Osteomyelitis, unspecified: Secondary | ICD-10-CM

## 2023-01-10 DIAGNOSIS — Z23 Encounter for immunization: Secondary | ICD-10-CM | POA: Diagnosis not present

## 2023-01-10 NOTE — Progress Notes (Signed)
Patient Active Problem List   Diagnosis Date Noted   Diabetic ulcer of right great toe 11/10/2022   CKD (chronic kidney disease) stage 5, GFR less than 15 ml/min 07/12/2022   Former smoker 07/12/2022   History of stroke 07/12/2022   Diabetic neuropathy 10/28/2021   Anemia in chronic kidney disease 05/27/2020   History of amputation of lesser toe of left foot 08/18/2015   Hyperlipidemia, mixed 08/18/2015   Type 2 diabetes mellitus with peripheral neuropathy 08/18/2015   Essential hypertension 04/17/2013    Patient's Medications  New Prescriptions   No medications on file  Previous Medications   AMLODIPINE (NORVASC) 10 MG TABLET    Take 10 mg by mouth daily in the afternoon.   ASPIRIN EC 81 MG TABLET    Take 81 mg by mouth daily. Swallow whole.   ATORVASTATIN (LIPITOR) 80 MG TABLET    Take 80 mg by mouth daily in the afternoon.   CALCITRIOL (ROCALTROL) 0.25 MCG CAPSULE    Take 0.25 mcg by mouth daily in the afternoon.   CONTINUOUS BLOOD GLUC SENSOR (FREESTYLE LIBRE 2 SENSOR) MISC    USE TO CHECK BLOOD GLUCOSE CONTINUOUSLY. CHANGE SENSOR EVERY 14 DAYS   DOCUSATE SODIUM (COLACE) 100 MG CAPSULE    Take 100 mg by mouth daily.   DULAGLUTIDE 4.5 MG/0.5ML SOPN    Inject 4.5 mg into the skin every Sunday.   NOVOLOG MIX 70/30 FLEXPEN (70-30) 100 UNIT/ML FLEXPEN    Inject 20 Units into the skin 2 (two) times daily with a meal.   TORSEMIDE (DEMADEX) 20 MG TABLET    Take 20 mg by mouth daily in the afternoon.  Modified Medications   No medications on file  Discontinued Medications   No medications on file    Subjective: [name] is a 72 y.o. M with PMHx as below(including A1c 8.3), CKD stage IV presents for hospital follow-up of left foot osteomyelitis. He was admitted 1/9-1/19/23 to [facility] with worsening diabetic foot wound. He started on bactrim prior to admission. Taken to OR on 1/9 for I7D and 1st metatarsal resection on 1/11 OR Cx + strep anginosis, proteus mirabilis  and Prevotella bivia. Started on linezolid(avoid nephrotoxicity of vancomycin) and pip-tazo. He continued to fever. ID engaged. MRI left ankle showed abscess in hindfoot, calcaneal acute osteomyelitis, abscess in the proximal abductor hallucis. He underwent  debridement of left heel with bone Bx on 1/15. HE was transitioned to Augmentin and doxycyline to complete 6 weeks of antibiotics from OR 1/15 (EOT 11/26/21). 10/26/21: He reports slight tremors that started a couple weeks priro to admission. He denies fever, chills, N/V/D. Interval: Admitted to [facility] 1/261/27 for hyperkalemia and AKI on CKD IV. Started on lokelma.  12/02/21 Pt reprots wound vac is off. No new complaints.  Interim: on 3/30 left heel wound debrided by podiatry. 01/06/22 MR left ankle showed acute osteo of plantar medial aspect of calcaneus.  01/11/22: Pt reports he is still taking antibiotics. Instead of taking Augmentin and doxycyline twice a day, he was taking it once a day.  Interim: Seen by podiatry(Dr. Ralene Cork on 03/15/22) and noted wound was healed. Follows /03/15/22: Reports he is doing well. Still taking antibiotics(twice a day). /  05/12/22: Antibiotics stopped at last visit(9weeks). Seen by Dr. Ralene Cork DPM on 71/12 wound healing. MRI on 7/19 showed resolution of abscess.  Interim: Patient was hospitalized at [facility] for first toe osteomyelitis.  He had been placed on  Augmentin by podiatry for couple weeks.  Admitted due to MRI findings of osteomyelitis and concern for need for IV antibiotics.  On admission patient was afebrile without leukocytosis.  MRI showed soft tissue ulceration along the dorsal medial aspect great toe associated osteomyelitis distal first phalanx, medial head of the first proximal phalanx.  He was initially started on broad-spectrum antibiotics with bank mycin and cefepime in the.  ID engaged.  Ideally would have like to obtain bone biopsies off of antibiotics.  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  Patient on doxycycline and Augmentin.  08/31/2022: Patient reports he is tolerating Doxy Augmentin.  Denies any missed doses.  Denies fevers and chills. 10/05/2022 patient is doing well.  He completed his antibiotics.  Today 01/10/23: Doing well no new complaints Review of Systems: Review of Systems  All other systems reviewed and are negative.   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)    Diabetic neuropathy (HCC)    Diabetic ulcer of foot associated with diabetes mellitus due to underlying condition, limited to breakdown of skin (HCC)    left   GERD (gastroesophageal reflux disease)    Hyperlipidemia    Hypertension    Non compliance w medication regimen    Osteomyelitis (HCC) 08/2022   great toe of right foot   Osteomyelitis of left foot (HCC)    Secondary hyperparathyroidism of renal origin [facility])    per nephrology lov dr Thedore Mins 05-22-2022   Stroke [facility]) 2016   no deficits    Social History   Tobacco Use   Smoking status: Former   Smokeless tobacco: Never  Vaping Use   Vaping Use: Never used  Substance Use Topics   Alcohol use: Not Currently   Drug use: Never    No family history on file.  Allergies  Allergen Reactions   Losartan Potassium Other (See Comments)    hyperkalemia   Sildenafil Other (See Comments)    Not effective. "Did not like the way it made me feel"    Health Maintenance  Topic Date Due   Medicare Annual Wellness (AWV)  Never done   OPHTHALMOLOGY EXAM  Never done   Zoster Vaccines- Shingrix (1 of 2) Never done   COVID-19 Vaccine (4 - 2023-24 season) 06/02/2022   INFLUENZA VACCINE  05/03/2023   HEMOGLOBIN A1C  05/11/2023   FOOT EXAM  07/12/2023   COLONOSCOPY (Pts 45-99yrs Insurance coverage will need to be confirmed)  08/10/2025   DTaP/Tdap/Td (2 - Td or Tdap) 08/17/2025   Pneumonia Vaccine 35+ Years old   Completed   Hepatitis C Screening  Completed   HPV VACCINES  Aged Out    Objective:  There were no vitals filed for this visit. There is no height or weight on file to calculate BMI.  Physical Exam Constitutional:      General: He is not in acute distress.    Appearance: He is normal weight. He is not toxic-appearing.  HENT:     Head: Normocephalic and atraumatic.     Right Ear: External ear normal.     Left Ear: External ear normal.     Nose: No congestion or rhinorrhea.     Mouth/Throat:     Mouth: Mucous membranes are moist.     Pharynx: Oropharynx is clear.  Eyes:  Extraocular Movements: Extraocular movements intact.     Conjunctiva/sclera: Conjunctivae normal.     Pupils: Pupils are equal, round, and reactive to light.  Cardiovascular:     Rate and Rhythm: Normal rate and regular rhythm.     Heart sounds: No murmur heard.    No friction rub. No gallop.  Pulmonary:     Effort: Pulmonary effort is normal.     Breath sounds: Normal breath sounds.  Abdominal:     General: Abdomen is flat. Bowel sounds are normal.     Palpations: Abdomen is soft.  Musculoskeletal:        General: No swelling. Normal range of motion.     Cervical back: Normal range of motion and neck supple.  Skin:    General: Skin is warm and dry.  Neurological:     General: No focal deficit present.     Mental Status: He is oriented to person, place, and time.  Psychiatric:        Mood and Affect: Mood normal.     Lab Results Lab Results  Component Value Date   WBC 9.4 10/18/2022   HGB 11.6 (L) 10/20/2022   HCT 34.0 (L) 10/20/2022   MCV 88.1 10/18/2022   PLT 210 10/18/2022    Lab Results  Component Value Date   CREATININE 4.80 (H) 10/20/2022   BUN 35 (H) 10/20/2022   NA 144 10/20/2022   K 4.9 10/20/2022   CL 114 (H) 10/20/2022   CO2 23 10/18/2022    Lab Results  Component Value Date   ALT 90 (H) 10/05/2022   AST 32 10/05/2022   ALKPHOS 68 08/16/2022   BILITOT 0.3 10/05/2022     Lab Results  Component Value Date   CHOL 122 07/11/2022   HDL 30 (L) 07/11/2022   LDLCALC 56 07/11/2022   TRIG 219 (H) 07/11/2022   CHOLHDL 4.1 07/11/2022   No results found for: "LABRPR", "RPRTITER" No results found for: "HIV1RNAQUANT", "HIV1RNAVL", "CD4TABS"  Assessment/Plan  72 year old male admitted with:   #Right first toe osteomyelitis with 11/18 bone biopsy cultures positive rare CoNS. #Concern for nonhealing ulcer #Diabetes mellitus(A1c 7.7) #Hx of left foot osteomyelitis treated with prolonged antibioitcs -Again no obvious signs of skin soft tissue infection.  Although there is some areas on his toes that are callused over.  He is tolerating doxycycline and Augmentin.  His bone biopsy did not show osteomyelitis blood cultures grew rare MR CONS(likely contaminant).  Completed 6 weeks of doxycycline and Augmentin EOT 12/28.  -Seen by podiatry on 1/4 and woudn debrided and overall smaller and improved.   -Followed by wound care, wound improving, on HBO.  -ESR, CRP stable at last visit on 10/05/22 off of abx Antibiotics:  Doxycycline and Augmentin 11/16 - 12/28 Plan Follow-up in 6 months, ulcer is still present but improving.    #ESRD on HD-recently started  #COVID booster  Danelle EarthlyMayanka Naw Lasala, MD [facility] 01/10/2023, 8:34 AM   I have personally spent 31 minutes involved in face-to-face and non-face-to-face activities for this patient on the day of the visit. Professional time spent includes the following activities: Preparing to see the patient (review of tests), Obtaining and/or reviewing separately obtained history (admission/discharge record), Performing a medically appropriate examination and/or evaluation , Ordering medications/tests/procedures, referring and communicating with other health care professionals, Documenting clinical information in the EMR, Independently interpreting results (not separately reported),  Communicating results to the patient/family/caregiver, Counseling and educating the patient/family/caregiver and  Care coordination (not separately reported).

## 2023-01-11 ENCOUNTER — Encounter (HOSPITAL_BASED_OUTPATIENT_CLINIC_OR_DEPARTMENT_OTHER): Payer: No Typology Code available for payment source | Admitting: General Surgery

## 2023-01-11 DIAGNOSIS — L97514 Non-pressure chronic ulcer of other 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: Secondary | ICD-10-CM | POA: Diagnosis not present

## 2023-01-11 DIAGNOSIS — E11621 Type 2 diabetes mellitus with foot ulcer: Secondary | ICD-10-CM | POA: Diagnosis not present

## 2023-01-12 ENCOUNTER — Encounter (HOSPITAL_BASED_OUTPATIENT_CLINIC_OR_DEPARTMENT_OTHER): Payer: No Typology Code available for payment source | Admitting: General Surgery

## 2023-01-12 NOTE — Progress Notes (Signed)
[name], [name] ([id number]) [id number] Page 1 of 11 Visit Report for 01/11/2023 Chief Complaint Document Details Patient Name: Date of Service: [name], [name] 01/11/2023 8:15 A M Medical Record Number: [id number] Patient Account Number: [id number] Date of Birth/Sex: Treating RN: 1951/01/26 (72 y.o. M) Primary Care Provider: [name] Other Other Clinician: Referring Provider: Treating Provider/Extender: [name] Weeks in Treatment: 13 Information Obtained from: Patient Chief Complaint Patients presents for treatment of an open diabetic ulcer on the right great toe Electronic Signature(s) Signed: 01/11/2023 9:32:03 AM By: [name] MD FACS Entered By: [name] on 01/11/2023 09:32:03 -------------------------------------------------------------------------------- Cellular or Tissue Based Product Details Patient Name: Date of Service: [name], [name] 01/11/2023 8:15 A M Medical Record Number: [id number] Patient Account Number: [id number] Date of Birth/Sex: Treating RN: 01/24/1951 (72 y.o. [name] Primary Care Provider: [name] Other Other Clinician: Referring Provider: Treating Provider/Extender: Gracy Racer in Treatment: 13 Cellular or Tissue Based Product Type Wound #1 Right,Medial T Great oe Applied to: Performed By: Physician [name], MD Cellular or Tissue Based Product Type: Epicord Level of Consciousness (Pre-procedure): Awake and Alert Pre-procedure Verification/Time Out Yes - 09:10 Taken: Location: genitalia / hands / feet / multiple digits Wound Size (sq cm): 0.35 Product Size (sq cm): 6 Waste Size (sq cm): 3 Waste Reason: wound size Amount of Product Applied (sq cm): 3 Instrument Used: Forceps, Scissors Lot #: [id number] Order #: 1 Expiration Date: 07/03/2027 Fenestrated: No Reconstituted: Yes Solution Type: saline Solution Amount: 2 ml Lot #:  [id number] Solution Expiration Date: 03/01/2025 Secured: Yes Secured With: Steri-Strips Dressing Applied: Yes Primary Dressing: adaptic, gauze Procedural Pain: 0 Post Procedural Pain: 0 Response to Treatment: Procedure was tolerated well Level of Consciousness (Post- Awake and Alert procedure): Post Procedure Diagnosis Same as Pre-procedure Electronic Signature(s) Signed: 01/11/2023 10:28:20 AM By: [name] MD FACS [place], [name] ([id number]) [id number] Page 2 of 11 Signed: 01/11/2023 5:21:32 PM By: Zenaida Deed RN, BSN Entered By: Zenaida Deed on 01/11/2023 09:16:05 -------------------------------------------------------------------------------- Debridement Details Patient Name: Date of Service: [name], [name] 01/11/2023 8:15 A M Medical Record Number: [id number] Patient Account Number: [id number] Date of Birth/Sex: Treating RN: 07-21-1951 (72 y.o. [name] Primary Care Provider: [name] Other Other Clinician: Referring Provider: Treating Provider/Extender: [name] Weeks in Treatment: 13 Debridement Performed for Assessment: Wound #1 Right,Medial T Great oe Performed By: Physician [name], MD Debridement Type: Debridement Severity of Tissue Pre Debridement: Necrosis of bone Level of Consciousness (Pre-procedure): Awake and Alert Pre-procedure Verification/Time Out Yes - 09:10 Taken: Start Time: 09:10 Pain Control: Lidocaine 4% Topical Solution T Area Debrided (L x W): otal 1 (cm) x 2 (cm) = 2 (cm) Tissue and other material debrided: Non-Viable, Callus, Slough, Skin: Epidermis, Slough Level: Skin/Epidermis Debridement Description: Selective/Open Wound Instrument: Curette Bleeding: Minimum Hemostasis Achieved: Pressure Procedural Pain: 0 Post Procedural Pain: 0 Response to Treatment: Procedure was tolerated well Level of Consciousness (Post- Awake and Alert procedure): Post Debridement  Measurements of Total Wound Length: (cm) 0.5 Width: (cm) 0.7 Depth: (cm) 0.2 Volume: (cm) 0.055 Character of Wound/Ulcer Post Debridement: Stable Severity of Tissue Post Debridement: Necrosis of bone Post Procedure Diagnosis Same as Pre-procedure Notes scribed for Dr. Lady Gary by Zenaida Deed, RN Electronic Signature(s) Signed: 01/11/2023 10:28:20 AM By: [name] MD FACS Signed: 01/11/2023 5:21:32 PM By: Zenaida Deed RN, BSN Entered By: Zenaida Deed on 01/11/2023 09:12:31 -------------------------------------------------------------------------------- HPI Details Patient Name: Date of Service: [name], [name] 01/11/2023 8:15 A M  Medical Record Number: [id number] Patient Account Number: [id number] Date of Birth/Sex: Treating RN: [birth year] (72 y.o. M) Primary Care Provider: [name] Other Other Clinician: Referring Provider: Treating Provider/Extender: [name] Weeks in Treatment: 13 History of Present Illness HPI Description: ADMISSION 10/12/2022 This is a 72 year old type II diabetic (last hemoglobin A1c 7.7 in October 2023) with stage V chronic kidney disease and hypertension. He has been followed by podiatry for an ulcer on his right great toe. He has a history of prior toe and foot infections requiring amputation. In November 2023, he had an outpatient MRI that demonstrated osteomyelitis. He was admitted to the hospital for IV antibiotics and subsequently underwent irrigation and debridement of the toe with [facility], [name] ([id number]) [phone].pdf Page 3 of 11 biopsy and placement of married 3 layer matrix. Cultures grew out MRSA and osteomyelitis was seen on pathology. He was followed by infectious disease and given a course of doxycycline and Augmentin. He completed this course at the end of December. He has been evaluated by vascular surgery with formal ABIs as well as consultation. He does have adequate blood flow at  least to the dorsalis pedis. He last saw podiatry on January 4 of this year where he underwent debridement. It was felt he would benefit from specialized wound care and he was referred to our center for further evaluation and management. On the dorsal aspect of his right great toe, there is a circular ulcer. There is a fair amount of undermining from callus and dry skin. The surface is a bit pale and fibrotic, but there is no obvious necrosis. 10/19/2022: He has been approved by his insurance to undergo hyperbaric oxygen therapy. EKG has been obtained and is normal. We are still waiting on his chest x-ray. The wound is clean with just a little bit of periwound callus and slough on the surface. Tendon is exposed. 10/26/2022: Unfortunately, the required co-pay from his insurance is cost prohibitive for him and he will not be able to receive hyperbaric oxygen therapy. Some moisture got under the dry skin around his wound and lifted it off but there is good epithelialized tissue beneath this. The exposed tendon has deteriorated. 11/03/2022: After some discussion with the financial office, it has become feasible for the patient to undergo hyperbaric oxygen therapy and he has received his first treatments this week. The wound is a little bit smaller and the tissue has a better color to it, but there is still extensive nonviable subcutaneous tissue and slough present. 11/09/2022: The wound measured a little bit smaller today. There is still slough and nonviable subcu tissue and tendon present. 11/16/2022: The wound continues to contract. There is still substantial depth to it, however, and bone remains exposed at the base. This is also a (early) 30-day update for HBO. 11/27/2022: Unfortunately, the wound has deteriorated. It appears that the tissue has become macerated. There is a second ulcer on the lateral aspect of his great toe. The nail is lifting off of the bed and there is wet skin lifting off of the  underlying tissue. The initial wound is smaller but still has a fair amount of depth to it. The patient does report quite a bit more drainage since his last visit. 12/20/2022: For scheduling reasons, somehow we have missed having a wound care visit in between his hyperbaric oxygen therapy treatments. The 2 openings of the wound have converged into 1 large site. There is necrotic tendon present at the lateral aspect of the great toe. The  bone is visible, including the joint space. The area of the initial wound is flush with the surrounding skin. No malodor or purulent drainage. 12/28/2022: The wound is vastly improved this week. The 2 areas of the wound have separated due to tissue bridging and healing the space between. There is no more necrotic tissue visible, although the bone/joint space remains exposed. 01/04/2023: His wound continues to improve. The more medial aspect of the toe is nearly closed. Bone remains exposed laterally. He has been approved for Epicord. He is going to start dialysis next week. 01/11/2023: The medial aspect of the wound is closed completely. The lateral open portion of his wound has contracted and is shallower. There is still bone exposure. He has started dialysis. We are going to apply Epicord today. Electronic Signature(s) Signed: 01/11/2023 9:32:55 AM By: [name] MD FACS Entered By: [name] on 01/11/2023 09:32:54 -------------------------------------------------------------------------------- Physical Exam Details Patient Name: Date of Service: [name], [name] 01/11/2023 8:15 A M Medical Record Number: [id number] Patient Account Number: [id number] Date of Birth/Sex: Treating RN: 10/01/1951 (72 y.o. M) Primary Care Provider: [name] Other Other Clinician: Referring Provider: Treating Provider/Extender: [name] Weeks in Treatment: 13 Constitutional Hypertensive, asymptomatic. . . . no acute distress. Respiratory Normal work of  breathing on room air. Notes 01/11/2023: The medial aspect of the wound is closed completely. The lateral open portion of his wound has contracted and is shallower. There is still bone exposure. Electronic Signature(s) Signed: 01/11/2023 9:33:35 AM By: [name] MD FACS Entered By: [name] on 01/11/2023 09:33:34 -------------------------------------------------------------------------------- Physician Orders Details Patient Name: Date of Service: [facility], [name] 01/11/2023 8:15 A M Medical Record Number: [id number] Patient Account Number: [id number] Date of Birth/Sex: Treating RN: [birth year] (72 y.o. [name] Primary Care Provider: [name] Other Other Clinician: Referring Provider: Treating Provider/Extender: [name] [place], [name] ([id number]) [id number] Page 4 of 11 Weeks in Treatment: 13 Verbal / Phone Orders: No Diagnosis Coding ICD-10 Coding Code Description L97.514 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.621 Type 2 diabetes mellitus with foot ulcer N18.5 Chronic kidney disease, stage 5 M86.9 Osteomyelitis, unspecified E11.42 Type 2 diabetes mellitus with diabetic polyneuropathy I10 Essential (primary) hypertension Follow-up Appointments ppointment in 1 week. - Dr. Lady Gary Rm 1 Return A Friday 4/19 @ 10:30 am Anesthetic Wound #1 Right,Medial T Great oe (In clinic) Topical Lidocaine 4% applied to wound bed Cellular or Tissue Based Products Cellular or Tissue Based Product Type: - epicord #1 Bathing/ Shower/ Hygiene May shower and wash wound with soap and water. Off-Loading Wound #1 Right,Medial T Great oe Other: - Diabetic shoe Hyperbaric Oxygen Therapy Evaluate for HBO Therapy Indication: - chronic refractory osteomyelitis If appropriate for treatment, begin HBOT per protocol: 2.5 ATA for 90 Minutes with 2 Five (5) Minute A Breaks ir Total Number of  Treatments: - 40 plus 40 (12/20/22) One treatments per day (delivered Monday through Friday unless otherwise specified in Special Instructions below): Finger stick Blood Glucose Pre- and Post- HBOT Treatment. Follow Hyperbaric Oxygen Glycemia Protocol Give two 4oz orange juices in addition to Glucerna when the glycemic protocol is used. A frin (Oxymetazoline HCL) 0.05% nasal spray - 1 spray in both nostrils daily as needed prior to HBO treatment for difficulty clearing ears Wound Treatment Wound #1 - T Great oe Wound Laterality: Right, Medial Cleanser: Soap and Water 1 x Per Day/30 Days Discharge Instructions: May shower and wash wound with dial antibacterial soap  and water prior to dressing change. Cleanser: Wound Cleanser 1 x Per Day/30 Days Discharge Instructions: Cleanse the wound with wound cleanser prior to applying a clean dressing using gauze sponges, not tissue or cotton balls. Prim Dressing: Epicord ary 1 x Per Day/30 Days Secondary Dressing: ADAPTIC TOUCH 3x4.25 in 1 x Per Day/30 Days Discharge Instructions: Apply over primary dressing as directed. Secondary Dressing: Woven Gauze Sponge, Non-Sterile 4x4 in 1 x Per Day/30 Days Discharge Instructions: Apply over primary dressing as directed. Secured With: Insurance underwriter, Sterile 2x75 (in/in) 1 x Per Day/30 Days Discharge Instructions: Secure with stretch gauze as directed. Secured With: 57M Medipore Scientist, research (life sciences) Surgical T 2x10 (in/yd) 1 x Per Day/30 Days ape Discharge Instructions: Secure with tape as directed. GLYCEMIA INTERVENTIONS PROTOCOL PRE-HBO GLYCEMIA INTERVENTIONS ACTION INTERVENTION Obtain pre-HBO capillary blood glucose (ensure 1 physician order is in chart). A. Notify HBO physician and await physician orders. 2 If result is 70 mg/dl or below: B. If the result meets the hospital definition of a [place], [place] ([id number]) [id number] Page 5 of 11 critical result, follow  hospital policy. A. Give patient an 8 ounce Glucerna Shake, an 8 ounce Ensure, or 8 ounces of a Glucerna/Ensure equivalent dietary supplement*. B. Wait 30 minutes. If result is 71 mg/dl to 409 mg/dl: C. Retest patients capillary blood glucose (CBG). D. If result greater than or equal to 110 mg/dl, proceed with HBO. If result less than 110 mg/dl, notify HBO physician and consider holding HBO. If result is 131 mg/dl to 811 mg/dl: A. Proceed with HBO. A. Notify HBO physician and await physician orders. B. It is recommended to hold HBO and do If result is 250 mg/dl or greater: blood/urine ketone testing. C. If the result meets the hospital definition of a critical result, follow hospital policy. POST-HBO GLYCEMIA INTERVENTIONS ACTION INTERVENTION Obtain post HBO capillary blood glucose (ensure 1 physician order is in chart). A. Notify HBO physician and await physician orders. 2 If result is 70 mg/dl or below: B. If the result meets the hospital definition of a critical result, follow hospital policy. A. Give patient an 8 ounce Glucerna Shake, an 8 ounce Ensure, or 8 ounces of a Glucerna/Ensure equivalent dietary supplement*. B. Wait 15 minutes for symptoms of If result is 71 mg/dl to 914 mg/dl: hypoglycemia (i.e. nervousness, anxiety, sweating, chills, clamminess, irritability, confusion, tachycardia or dizziness). C. If patient asymptomatic, discharge patient. If patient symptomatic, repeat capillary blood glucose (CBG) and notify HBO physician. If result is 101 mg/dl to 782 mg/dl: A. Discharge patient. A. Notify HBO physician and await physician orders. B. It is recommended to do blood/urine ketone If result is 250 mg/dl or greater: testing. C. If the result meets the hospital definition of a critical result, follow hospital policy. *Juice or candies are NOT equivalent products. If patient refuses the Glucerna or Ensure, please consult the hospital dietitian  for an appropriate substitute. Electronic Signature(s) Signed: 01/11/2023 10:28:20 AM By: [name] MD FACS Entered By: [name] on 01/11/2023 09:33:45 -------------------------------------------------------------------------------- Problem List Details Patient Name: Date of Service: [facility], [name] 01/11/2023 8:15 A M Medical Record Number: [id number] Patient Account Number: [id number] Date of Birth/Sex: Treating RN: 10-06-1950 (72 y.o. [name] Primary Care Provider: [name] Other Other Clinician: Referring Provider: Treating Provider/Extender: [name] Weeks in Treatment: 13 Active Problems ICD-10 Encounter Code Description Active Date MDM Diagnosis L97.514 Non-pressure chronic ulcer of other 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 10/12/2022 No Yes E11.621  Type 2 diabetes mellitus with foot ulcer 10/12/2022 No Yes N18.5 Chronic kidney disease, stage 5 10/12/2022 No Yes [name], [name] ([id number]) [phone].pdf Page 6 of 11 M86.9 Osteomyelitis, unspecified 10/12/2022 No Yes E11.42 Type 2 diabetes mellitus with diabetic polyneuropathy 10/12/2022 No Yes I10 Essential (primary) hypertension 10/12/2022 No Yes Inactive Problems Resolved Problems Electronic Signature(s) Signed: 01/11/2023 9:27:46 AM By: [name] MD FACS Entered By: [name] on 01/11/2023 09:27:46 -------------------------------------------------------------------------------- Progress Note Details Patient Name: Date of Service: [name], [name] 01/11/2023 8:15 A M Medical Record Number: [id number] Patient Account Number: [id number] Date of Birth/Sex: Treating RN: 12-20-1950 (72 y.o. M) Primary Care Provider: [name] Other Other Clinician: Referring Provider: Treating Provider/Extender: [name] Weeks in Treatment: 13 Subjective Chief Complaint Information obtained from Patient Patients presents for treatment of an  open diabetic ulcer on the right great toe History of Present Illness (HPI) ADMISSION 10/12/2022 This is a 72 year old type II diabetic (last hemoglobin A1c 7.7 in October 2023) with stage V chronic kidney disease and hypertension. He has been followed by podiatry for an ulcer on his right great toe. He has a history of prior toe and foot infections requiring amputation. In November 2023, he had an outpatient MRI that demonstrated osteomyelitis. He was admitted to the hospital for IV antibiotics and subsequently underwent irrigation and debridement of the toe with biopsy and placement of married 3 layer matrix. Cultures grew out MRSA and osteomyelitis was seen on pathology. He was followed by infectious disease and given a course of doxycycline and Augmentin. He completed this course at the end of December. He has been evaluated by vascular surgery with formal ABIs as well as consultation. He does have adequate blood flow at least to the dorsalis pedis. He last saw podiatry on January 4 of this year where he underwent debridement. It was felt he would benefit from specialized wound care and he was referred to our center for further evaluation and management. On the dorsal aspect of his right great toe, there is a circular ulcer. There is a fair amount of undermining from callus and dry skin. The surface is a bit pale and fibrotic, but there is no obvious necrosis. 10/19/2022: He has been approved by his insurance to undergo hyperbaric oxygen therapy. EKG has been obtained and is normal. We are still waiting on his chest x-ray. The wound is clean with just a little bit of periwound callus and slough on the surface. Tendon is exposed. 10/26/2022: Unfortunately, the required co-pay from his insurance is cost prohibitive for him and he will not be able to receive hyperbaric oxygen therapy. Some moisture got under the dry skin around his wound and lifted it off but there is good epithelialized tissue beneath  this. The exposed tendon has deteriorated. 11/03/2022: After some discussion with the financial office, it has become feasible for the patient to undergo hyperbaric oxygen therapy and he has received his first treatments this week. The wound is a little bit smaller and the tissue has a better color to it, but there is still extensive nonviable subcutaneous tissue and slough present. 11/09/2022: The wound measured a little bit smaller today. There is still slough and nonviable subcu tissue and tendon present. 11/16/2022: The wound continues to contract. There is still substantial depth to it, however, and bone remains exposed at the base. This is also a (early) 30-day update for HBO. 11/27/2022: Unfortunately, the wound has deteriorated. It appears that the tissue has become macerated. There is a second  ulcer on the lateral aspect of his great toe. The nail is lifting off of the bed and there is wet skin lifting off of the underlying tissue. The initial wound is smaller but still has a fair amount of depth to it. The patient does report quite a bit more drainage since his last visit. 12/20/2022: For scheduling reasons, somehow we have missed having a wound care visit in between his hyperbaric oxygen therapy treatments. The 2 openings of the wound have converged into 1 large site. There is necrotic tendon present at the lateral aspect of the great toe. The bone is visible, including the joint space. The area of the initial wound is flush with the surrounding skin. No malodor or purulent drainage. 12/28/2022: The wound is vastly improved this week. The 2 areas of the wound have separated due to tissue bridging and healing the space between. There is no more necrotic tissue visible, although the bone/joint space remains exposed. 01/04/2023: His wound continues to improve. The more medial aspect of the toe is nearly closed. Bone remains exposed laterally. He has been approved for Epicord. He is going to start  dialysis next week. [name], [name] ([id number]) [id number] Page 7 of 11 01/11/2023: The medial aspect of the wound is closed completely. The lateral open portion of his wound has contracted and is shallower. There is still bone exposure. He has started dialysis. We are going to apply Epicord today. Patient History Information obtained from Patient. Family History Diabetes - Mother,Siblings, Heart Disease - Siblings, Hypertension - Siblings, Seizures - Father, Stroke - Siblings, No family history of Cancer, Hereditary Spherocytosis, Kidney Disease, Lung Disease, Thyroid Problems, Tuberculosis. Social History Former smoker, Marital Status - Married, Alcohol Use - Rarely, Drug Use - No History, Caffeine Use - Daily. Medical History Eyes Patient has history of Cataracts - rt eye Ear/Nose/Mouth/Throat Denies history of Chronic sinus problems/congestion Hematologic/Lymphatic Denies history of Anemia, Hemophilia, Human Immunodeficiency Virus, Lymphedema, Sickle Cell Disease Respiratory Denies history of Aspiration, Asthma, Chronic Obstructive Pulmonary Disease (COPD), Pneumothorax, Tuberculosis Cardiovascular Patient has history of Hypertension Gastrointestinal Denies history of Cirrhosis , Colitis, Crohnoos, Hepatitis A, Hepatitis B, Hepatitis C Endocrine Patient has history of Type II Diabetes Genitourinary Denies history of End Stage Renal Disease Immunological Denies history of Lupus Erythematosus, Raynaudoos, Scleroderma Integumentary (Skin) Denies history of History of Burn Musculoskeletal Patient has history of Osteomyelitis - MRI confirmed (08/16/22) Denies history of Gout Neurologic Patient has history of Neuropathy Psychiatric Patient has history of Confinement Anxiety Denies history of Anorexia/bulimia Hospitalization/Surgery History - AV fistula place left upper arm 10/20/22. Medical A Surgical History Notes nd Genitourinary CKD  IV Objective Constitutional Hypertensive, asymptomatic. no acute distress. Vitals Time Taken: 8:55 AM, Height: 72 in, Weight: 185 lbs, BMI: 25.1, Temperature: 98.5 F, Pulse: 98 bpm, Respiratory Rate: 18 breaths/min, Blood Pressure: 163/71 mmHg. Respiratory Normal work of breathing on room air. General Notes: 01/11/2023: The medial aspect of the wound is closed completely. The lateral open portion of his wound has contracted and is shallower. There is still bone exposure. Integumentary (Hair, Skin) Wound #1 status is Open. Original cause of wound was Pressure Injury. The date acquired was: 08/16/2022. The wound has been in treatment 13 weeks. The wound is located on the Right,Medial T Great. The wound measures 0.5cm length x 0.7cm width x 0.2cm depth; 0.275cm^2 area and 0.055cm^3 volume. oe There is bone, tendon, and Fat Layer (Subcutaneous Tissue) exposed. There is no tunneling or undermining noted. There is a small amount of serosanguineous  drainage noted. The wound margin is distinct with the outline attached to the wound base. There is small (1-33%) red granulation within the wound bed. There is a medium (34-66%) amount of necrotic tissue within the wound bed including Adherent Slough. The periwound skin appearance had no abnormalities noted for texture. The periwound skin appearance had no abnormalities noted for moisture. The periwound skin appearance had no abnormalities noted for color. Periwound temperature was noted as No Abnormality. 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 There was a Selective/Open Wound Skin/Epidermis Debridement with a total area of 2 sq cm performed by [name], MD. With the following instrument(s): Curette to remove Non-Viable tissue/material. Material removed includes Callus, Slough, and Skin: Epidermis after achieving pain control using Lidocaine 4% Topical Solution. No specimens were taken. A time out was conducted at 09:10, prior to the start of the procedure. A Minimum amount of bleeding was controlled with Pressure. The procedure was tolerated well with a pain level of 0 throughout and a pain level of 0 following the procedure. Post Debridement Measurements: 0.5cm length x 0.7cm width x 0.2cm depth; 0.055cm^3 volume. Character of Wound/Ulcer Post Debridement is stable. Severity of Tissue Post Debridement is: Necrosis of bone. Post procedure Diagnosis Wound #1: Same as Pre-Procedure General Notes: scribed for Dr. Lady Gary by Zenaida Deed, RN. Pre-procedure diagnosis of Wound #1 is a Diabetic Wound/Ulcer of the Lower Extremity located on the Right,Medial T Great. A skin graft procedure using a oe bioengineered skin substitute/cellular or tissue based product was performed by [name], MD with the following instrument(s): Forceps and Scissors. Epicord was applied and secured with Steri-Strips. 3 sq cm of product was utilized and 3 sq cm was wasted due to wound size. Post Application, adaptic, gauze was applied. A Time Out was conducted at 09:10, prior to the start of the procedure. The procedure was tolerated well with a pain level of 0 throughout and a pain level of 0 following the procedure. Post procedure Diagnosis Wound #1: Same as Pre-Procedure . Plan Follow-up Appointments: Return Appointment in 1 week. - Dr. Lady Gary Rm 1 Friday 4/19 @ 10:30 am Anesthetic: Wound #1 Right,Medial T  Great: oe (In clinic) Topical Lidocaine 4% applied to wound bed Cellular or Tissue Based Products: Cellular or Tissue Based Product Type: - epicord #1 Bathing/ Shower/ Hygiene: May shower and wash wound with soap and water. Off-Loading: Wound #1 Right,Medial T Great: oe Other: - Diabetic shoe Hyperbaric Oxygen Therapy: Evaluate for HBO Therapy Indication: - chronic refractory osteomyelitis If appropriate for treatment, begin HBOT per protocol: 2.5 ATA for 90 Minutes with 2 Five (5) Minute Air Breaks T Number of Treatments: - 40 plus 40 (12/20/22) otal One treatments per day (delivered Monday through Friday unless otherwise specified in Special Instructions below): Finger stick Blood Glucose Pre- and Post- HBOT Treatment. Follow Hyperbaric Oxygen Glycemia Protocol Give two 4oz orange juices in addition to Glucerna when the glycemic protocol is used. Afrin (Oxymetazoline HCL) 0.05% nasal spray - 1 spray in both nostrils daily as needed prior to HBO treatment for difficulty  clearing ears WOUND #1: - T Great Wound Laterality: Right, Medial oe Cleanser: Soap and Water 1 x Per Day/30 Days Discharge Instructions: May shower and wash wound with dial antibacterial soap and water prior to dressing change. Cleanser: Wound Cleanser 1 x Per Day/30 Days Discharge Instructions: Cleanse the wound with wound cleanser prior to applying a clean dressing using gauze sponges, not tissue or cotton balls. Prim Dressing: Epicord 1 x Per Day/30 Days ary Secondary Dressing: ADAPTIC TOUCH 3x4.25 in 1 x Per Day/30 Days Discharge Instructions: Apply over primary dressing as directed. Secondary Dressing: Woven Gauze Sponge, Non-Sterile 4x4 in 1 x Per Day/30 Days Discharge Instructions: Apply over primary dressing as directed. Secured With: Insurance underwriter, Sterile 2x75 (in/in) 1 x Per Day/30 Days Discharge Instructions: Secure with stretch gauze as directed. Secured With: 27M Medipore Metallurgist Surgical T 2x10 (in/yd) 1 x Per Day/30 Days ape Discharge Instructions: Secure with tape as directed. 01/11/2023: The medial aspect of the wound is closed completely. The lateral open portion of his wound has contracted and is shallower. There is still bone exposure. I used a curette to debride dry skin and callus from the periwound and slough from the wound surface itself. Epicord was then rehydrated with saline and applied to the wound, packing it into the deepest aspect. It was secured with Adaptic and Steri-Strips. The patient was reminded not to remove the dressing nor [name], [name] ([id number]) [phone].pdf Page 9 of 11 to get his foot wet. I have communicated with his nephrologist and we have been able to adjust his hemodialysis schedule so that he can both get those treatments and come to the wound center for his hyperbaric oxygen therapy. Follow-up with me in 1 week. Electronic Signature(s) Signed: 01/11/2023 9:42:23 AM By: [name] MD FACS Previous Signature: 01/11/2023 9:34:08 AM Version By: [name] MD FACS Entered By: [name] on 01/11/2023 09:42:23 -------------------------------------------------------------------------------- HxROS Details Patient Name: Date of Service: [facility] [name], [name] 01/11/2023 8:15 A M Medical Record Number: [id number] Patient Account Number: [id number] Date of Birth/Sex: Treating RN: October 24, 1950 (72 y.o. M) Primary Care Provider: [name] Other Other Clinician: Referring Provider: Treating Provider/Extender: [name] Weeks in Treatment: 13 Information Obtained From Patient Eyes Medical History: Positive for: Cataracts - rt eye Ear/Nose/Mouth/Throat Medical History: Negative for: Chronic sinus problems/congestion Hematologic/Lymphatic Medical History: Negative for: Anemia; Hemophilia; Human Immunodeficiency Virus; Lymphedema; Sickle Cell Disease Respiratory Medical  History: Negative for: Aspiration; Asthma; Chronic Obstructive Pulmonary Disease (COPD); Pneumothorax; Tuberculosis Cardiovascular Medical History: Positive for: Hypertension Gastrointestinal Medical History: Negative for: Cirrhosis ; Colitis; Crohns; Hepatitis A; Hepatitis B; Hepatitis C Endocrine Medical History: Positive for: Type II Diabetes Time with diabetes: 2000 Treated with: Insulin Blood sugar tested every day: Yes Tested : Genitourinary Medical History: Negative for: End Stage Renal Disease Past Medical History Notes: CKD IV Immunological Medical History: Negative for: Lupus Erythematosus; Raynauds; Scleroderma Integumentary (Skin) Medical HistoryTOY, [name] ([id number]) [id number] Page 10 of 11 Negative for: History of Burn Musculoskeletal Medical History: Positive for: Osteomyelitis - MRI confirmed (08/16/22) Negative for: Gout Neurologic Medical History: Positive for: Neuropathy Psychiatric Medical History: Positive for: Confinement Anxiety Negative for: Anorexia/bulimia HBO Extended History Items Eyes: Cataracts Immunizations Pneumococcal Vaccine: Received Pneumococcal Vaccination: Yes Received Pneumococcal Vaccination On or After 60th Birthday: Yes Implantable Devices None Hospitalization / Surgery History Type of Hospitalization/Surgery AV fistula place left upper arm 10/20/22 Family and Social History Cancer: No; Diabetes: Yes - Mother,Siblings; Heart Disease: Yes - Siblings; Hereditary Spherocytosis: No;  Hypertension: Yes - Siblings; Kidney Disease: No; Lung Disease: No; Seizures: Yes - Father; Stroke: Yes - Siblings; Thyroid Problems: No; Tuberculosis: No; Former smoker; Marital Status - Married; Alcohol Use: Rarely; Drug Use: No History; Caffeine Use: Daily; Financial Concerns: No; Food, Clothing or Shelter Needs: No; Support System Lacking: No; Transportation Concerns: No Electronic Signature(s) Signed:  01/11/2023 10:28:20 AM By: [name] MD FACS Entered By: [name] on 01/11/2023 09:33:03 -------------------------------------------------------------------------------- SuperBill Details Patient Name: Date of Service: [facility] [name], [name] 01/11/2023 Medical Record Number: [id number] Patient Account Number: [id number] Date of Birth/Sex: Treating RN: 1951/04/28 (72 y.o. M) Primary Care Provider: [name] Other Other Clinician: Referring Provider: Treating Provider/Extender: [name] Weeks in Treatment: 13 Diagnosis Coding ICD-10 Codes Code Description L97.514 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.621 Type 2 diabetes mellitus with foot ulcer N18.5 Chronic kidney disease, stage 5 M86.9 Osteomyelitis, unspecified E11.42 Type 2 diabetes mellitus with diabetic polyneuropathy I10 Essential (primary) hypertension Facility Procedures : [name], [name] Code: [id number] [name] ([id number]) Description: Q4187 Epicord 2cm x 3cm - per sqcm [phone] Modifier: [id number]. Quantity: 6 pdf Page 11 of 11 : 36 CPT4 Code: [phone] ICD L9 Description: 5 - SKIN SUB GRAFT FACE/NK/HF/G -10 Diagnosis Description 7.514 Non-pressure chronic ulcer of other 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 Modifier: 1 Quantity: Physician Procedures : CPT4 Code Description Modifier [id number] - WC PHYS LEVEL 3 - EST PT 25 ICD-10 Diagnosis Description L97.514 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.621 Type 2 diabetes mellitus with foot ulcer M86.9  Osteomyelitis, unspecified N18.5 Chronic kidney disease, stage 5 Quantity: 1 : [id number] - WC PHYS SKIN SUB GRAFT FACE/NK/HF/G ICD-10 Diagnosis Description L97.514 Non-pressure chronic ulcer of other 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 Quantity: 1 Electronic Signature(s) Signed: 01/11/2023 9:43:02 AM By: [name] MD FACS Entered  By: [name] on 01/11/2023 09:43:00

## 2023-01-12 NOTE — Progress Notes (Signed)
[place], [place] ([id number]) [id number] Page 1 of 7 Visit Report for 01/11/2023 Arrival Information Details Patient Name: Date of Service: [name], [name] 01/11/2023 8:15 A M Medical Record Number: [id number] Patient Account Number: [id number] Date of Birth/Sex: Treating RN: 03-18-1951 (72 y.o. [name] Primary Care Keghan Mcfarren: [name] Other Other Clinician: Referring Illyanna Petillo: Treating Joely Losier/Extender: [name] Weeks in Treatment: 13 Visit Information History Since Last Visit Added or deleted any medications: Yes Patient Arrived: Cane Any new allergies or adverse reactions: No Arrival Time: 08:53 Had a fall or experienced change in No Accompanied By: self activities of daily living that may affect Transfer Assistance: None risk of falls: Patient Identification Verified: Yes Signs or symptoms of abuse/neglect since last visito No Secondary Verification Process Completed: Yes Hospitalized since last visit: No Patient Requires Transmission-Based Precautions: No Implantable device outside of the clinic excluding No Patient Has Alerts: No cellular tissue based products placed in the center since last visit: Has Dressing in Place as Prescribed: Yes Pain Present Now: No Electronic Signature(s) Signed: 01/11/2023 5:21:32 PM By: Zenaida Deed RN, BSN Entered By: Zenaida Deed on 01/11/2023 08:54:20 -------------------------------------------------------------------------------- Lower Extremity Assessment Details Patient Name: Date of Service: [facility], [name] 01/11/2023 8:15 A M Medical Record Number: [id number] Patient Account Number: [id number] Date of Birth/Sex: Treating RN: [birth year] (72 y.o. [name] Primary Care Kalden Wanke: [name] Other Other Clinician: Referring Kayan Blissett: Treating Alicyn Klann/Extender: [name] Weeks in Treatment: 13 Edema Assessment Assessed: [Left: No] [Right: No] Edema:  [Left: N] [Right: o] Calf Left: Right: Point of Measurement: From Medial Instep 36 cm Ankle Left: Right: Point of Measurement: From Medial Instep 22 cm Vascular Assessment Pulses: Dorsalis Pedis Palpable: [Right:Yes] Electronic Signature(s) Signed: 01/11/2023 5:21:32 PM By: Zenaida Deed RN, BSN Entered By: Zenaida Deed on 01/11/2023 08:57:13 [name], [name] ([id number]) [id number] Page 2 of 7 -------------------------------------------------------------------------------- Multi Wound Chart Details Patient Name: Date of Service: [name], [name] 01/11/2023 8:15 A M Medical Record Number: [id number] Patient Account Number: [id number] Date of Birth/Sex: Treating RN: 23-Aug-1951 (72 y.o. M) Primary Care Raye Wiens: [name] Other Other Clinician: Referring Zabrina Brotherton: Treating Jesselyn Rask/Extender: [name] Weeks in Treatment: 13 Vital Signs Height(in): 72 Pulse(bpm): 98 Weight(lbs): 185 Blood Pressure(mmHg): 163/71 Body Mass Index(BMI): 25.1 Temperature(F): 98.5 Respiratory Rate(breaths/min): 18 [1:Photos:] [N/A:N/A] Right, Medial T Great oe N/A N/A Wound Location: Pressure Injury N/A N/A Wounding Event: Diabetic Wound/Ulcer of the Lower N/A N/A Primary Etiology: Extremity Acute Osteomyelitis N/A N/A Secondary Etiology: Cataracts, Hypertension, Type II N/A N/A Comorbid History: Diabetes, Osteomyelitis, Neuropathy, Confinement Anxiety 08/16/2022 N/A N/A Date Acquired: 13 N/A N/A Weeks of Treatment: Open N/A N/A Wound Status: No N/A N/A Wound Recurrence: 0.5x0.7x0.2 N/A N/A Measurements L x W x D (cm) 0.275 N/A N/A A (cm) : rea 0.055 N/A N/A Volume (cm) : 0.00% N/A N/A % Reduction in A rea: 50.00% N/A N/A % Reduction in Volume: Grade 3 N/A N/A Classification: Small N/A N/A Exudate A mount: Serosanguineous N/A N/A Exudate Type: red, brown N/A N/A Exudate Color: Distinct, outline attached N/A N/A Wound  Margin: Small (1-33%) N/A N/A Granulation A mount: Red N/A N/A Granulation Quality: Medium (34-66%) N/A N/A Necrotic A mount: Fat Layer (Subcutaneous Tissue): Yes N/A N/A Exposed Structures: Tendon: Yes Bone: Yes Fascia: No Muscle: No Joint: No Small (1-33%) N/A N/A Epithelialization: Debridement - Selective/Open Wound N/A N/A Debridement: Pre-procedure Verification/Time Out 09:10 N/A N/A Taken: Lidocaine 4% T opical Solution N/A N/A Pain Control: Callus, Slough N/A N/A Tissue Debrided:  Skin/Epidermis N/A N/A Level: 2 N/A N/A Debridement A (sq cm): rea Curette N/A N/A Instrument: Minimum N/A N/A Bleeding: Pressure N/A N/A Hemostasis A chieved: 0 N/A N/A Procedural Pain: 0 N/A N/A Post Procedural Pain: Procedure was tolerated well N/A N/A Debridement Treatment Response: 0.5x0.7x0.2 N/A N/A Post Debridement Measurements L x W x D (cm) 0.055 N/A N/A Post Debridement Volume: (cm) Callus: Yes N/A N/A Periwound Skin Texture: Maceration: Yes N/A N/A Periwound Skin Moisture: No Abnormalities Noted N/A N/A Periwound Skin Color: No Abnormality N/A N/A Temperature: Cellular or Tissue Based Product N/A N/A Procedures Performed: Debridement [name], [name] ([id number]) [id number] Page 3 of 7 Treatment Notes Electronic Signature(s) Signed: 01/11/2023 9:31:56 AM By: [name] MD FACS Entered By: [name] on 01/11/2023 09:31:56 -------------------------------------------------------------------------------- Multi-Disciplinary Care Plan Details Patient Name: Date of Service: [facility] [place], [name] 01/11/2023 8:15 A M Medical Record Number: [id number] Patient Account Number: [id number] Date of Birth/Sex: Treating RN: 1951/06/03 (72 y.o. [name] Primary Care Sanaia Jasso: [name] Other Other Clinician: Referring Cecilee Rosner: Treating Laira Penninger/Extender: [name] Weeks in Treatment: 13 Multidisciplinary Care  Plan reviewed with physician Active Inactive HBO Nursing Diagnoses: Potential for barotraumas to ears, sinuses, teeth, and lungs or cerebral gas embolism related to changes in atmospheric pressure inside hyperbaric oxygen chamber Potential for oxygen toxicity seizures related to delivery of 100% oxygen at an increased atmospheric pressure Potential for pulmonary oxygen toxicity related to delivery of 100% oxygen at an increased atmospheric pressure Goals: Barotrauma will be prevented during HBO2 Date Initiated: 12/28/2022 T arget Resolution Date: 01/25/2023 Goal Status: Active Patient and/or family will be able to state/discuss factors appropriate to the management of their disease process during treatment Date Initiated: 12/28/2022 T arget Resolution Date: 01/25/2023 Goal Status: Active Patient will tolerate the hyperbaric oxygen therapy treatment Date Initiated: 12/28/2022 T arget Resolution Date: 01/25/2023 Goal Status: Active Patient will tolerate the internal climate of the chamber Date Initiated: 12/28/2022 T arget Resolution Date: 01/25/2023 Goal Status: Active Interventions: Administer the correct therapeutic gas delivery based on the patients needs and limitations, per physician order Assess and provide for patients comfort related to the hyperbaric environment and equalization of middle ear Assess for signs and symptoms related to adverse events, including but not limited to confinement anxiety, pneumothorax, oxygen toxicity and baurotrauma Assess patient for any history of confinement anxiety Assess patient's knowledge and expectations regarding hyperbaric medicine and provide education related to the hyperbaric environment, goals of treatment and prevention of adverse events Implement protocols to decrease risk of pneumothorax in high risk patients Notes: Nutrition Nursing Diagnoses: Impaired glucose control: actual or potential Goals: Patient/caregiver will maintain  therapeutic glucose control Date Initiated: 10/12/2022 Target Resolution Date: 01/25/2023 Goal Status: Active Interventions: Provide education on elevated blood sugars and impact on wound healing Provide education on nutrition Treatment Activities: Dietary management education, guidance and counseling : 10/12/2022 Giving encouragement to exercise : 10/12/2022 SYLVIO, WEATHERALL ([id number]) [id number] Page 4 of 7 Notes: Osteomyelitis Nursing Diagnoses: Infection: osteomyelitis Goals: Patient/caregiver will verbalize understanding of disease process and disease management Date Initiated: 10/12/2022 Target Resolution Date: 01/25/2023 Goal Status: Active Interventions: Assess for signs and symptoms of osteomyelitis resolution every visit Provide education on osteomyelitis Treatment Activities: MRI : 08/16/2022 Notes: Wound/Skin Impairment Nursing Diagnoses: Impaired tissue integrity Goals: Ulcer/skin breakdown will have a volume reduction of 30% by week 4 Date Initiated: 10/12/2022 Target Resolution Date: 01/25/2023 Goal Status: Active Interventions: Assess ulceration(s) every visit Provide education on smoking Treatment Activities: Skin care regimen initiated : 10/12/2022  Smoking cessation education : 10/12/2022 Notes: Electronic Signature(s) Signed: 01/11/2023 5:21:32 PM By: Zenaida Deed RN, BSN Entered By: Zenaida Deed on 01/11/2023 09:02:57 -------------------------------------------------------------------------------- Pain Assessment Details Patient Name: Date of Service: [facility], [name] 01/11/2023 8:15 A M Medical Record Number: [id number] Patient Account Number: [id number] Date of Birth/Sex: Treating RN: [birth year] (72 y.o. [name] Primary Care Reiley Bertagnolli: [name] Other Other Clinician: Referring Justina Bertini: Treating Nomi Rudnicki/Extender: [name] Weeks in Treatment: 13 Active Problems Location of Pain Severity and  Description of Pain Patient Has Paino No Site Locations Rate the pain. [place], [place] ([id number]) [id number] Page 5 of 7 Rate the pain. Current Pain Level: 0 Pain Management and Medication Current Pain Management: Electronic Signature(s) Signed: 01/11/2023 5:21:32 PM By: Zenaida Deed RN, BSN Entered By: Zenaida Deed on 01/11/2023 08:56:25 -------------------------------------------------------------------------------- Patient/Caregiver Education Details Patient Name: Date of Service: [name], [name] 4/11/2024andnbsp8:15 A M Medical Record Number: [id number] Patient Account Number: [id number] Date of Birth/Gender: Treating RN: Sep 16, 1951 (72 y.o. [name] Primary Care Physician: [name] Other Other Clinician: Referring Physician: Treating Physician/Extender: Gracy Racer in Treatment: 13 Education Assessment Education Provided To: Patient Education Topics Provided Elevated Blood Sugar/ Impact on Healing: Methods: Explain/Verbal Responses: Reinforcements needed, State content correctly Hyperbaric Oxygenation: Methods: Explain/Verbal Responses: Reinforcements needed, State content correctly Wound/Skin Impairment: Methods: Explain/Verbal Responses: Reinforcements needed, State content correctly Electronic Signature(s) Signed: 01/11/2023 5:21:32 PM By: Zenaida Deed RN, BSN Entered By: Zenaida Deed on 01/11/2023 09:03:32 -------------------------------------------------------------------------------- Wound Assessment Details Patient Name: Date of Service: [facility], [name] 01/11/2023 8:15 A M Medical Record Number: [id number] Patient Account Number: [id number] Date of Birth/Sex: Treating RN: [birth year] (72 y.o. [name] Primary Care Janaria Mccammon: [name] Other Other Clinician: Referring Mireyah Chervenak: Treating Fifi Schindler/Extender: [name] Weeks in Treatment: [address] [name] ([id number])  [id number] Page 6 of 7 Wound Status Wound Number: 1 Primary Diabetic Wound/Ulcer of the Lower Extremity Etiology: Wound Location: Right, Medial T Great oe Secondary Acute Osteomyelitis Wounding Event: Pressure Injury Etiology: Date Acquired: 08/16/2022 Wound Open Weeks Of Treatment: 13 Status: Clustered Wound: No Comorbid Cataracts, Hypertension, Type II Diabetes, Osteomyelitis, History: Neuropathy, Confinement Anxiety Photos Wound Measurements Length: (cm) 0.5 Width: (cm) 0.7 Depth: (cm) 0.2 Area: (cm) 0.275 Volume: (cm) 0.055 % Reduction in Area: 0% % Reduction in Volume: 50% Epithelialization: Small (1-33%) Tunneling: No Undermining: No Wound Description Classification: Grade 3 Wound Margin: Distinct, outline attached Exudate Amount: Small Exudate Type: Serosanguineous Exudate Color: red, brown Foul Odor After Cleansing: No Slough/Fibrino Yes Wound Bed Granulation Amount: Small (1-33%) Exposed Structure Granulation Quality: Red Fascia Exposed: No Necrotic Amount: Medium (34-66%) Fat Layer (Subcutaneous Tissue) Exposed: Yes Necrotic Quality: Adherent Slough Tendon Exposed: Yes Muscle Exposed: No Joint Exposed: No Bone Exposed: Yes Periwound Skin Texture Texture Color No Abnormalities Noted: Yes No Abnormalities Noted: Yes Moisture Temperature / Pain No Abnormalities Noted: Yes Temperature: No Abnormality Electronic Signature(s) Signed: 01/11/2023 5:21:32 PM By: Zenaida Deed RN, BSN Entered By: Zenaida Deed on 01/11/2023 09:02:11 -------------------------------------------------------------------------------- Vitals Details Patient Name: Date of Service: [name], [name] 01/11/2023 8:15 A M Medical Record Number: [id number] Patient Account Number: [id number] Date of Birth/Sex: Treating RN: September 20, 1951 (72 y.o. [name] Primary Care Rondle Lohse: [name] Other Other Clinician: Referring Samary Shatz: Treating  Lashonda Sonneborn/Extender: [name] Weeks in Treatment: 13 Vital Signs Time Taken: 08:55 Temperature (F): 98.5 [place], Maika ([id number]) [id number] Page 7 of 7 Height (in): 72 Pulse (bpm): 98 Weight (lbs): 185 Respiratory Rate (breaths/min): 18 Body Mass  Index (BMI): 25.1 Blood Pressure (mmHg): 163/71 Reference Range: 80 - 120 mg / dl Electronic Signature(s) Signed: 01/11/2023 5:21:32 PM By: Zenaida Deed RN, BSN Entered By: Zenaida Deed on 01/11/2023 08:56:10

## 2023-01-15 ENCOUNTER — Encounter (HOSPITAL_BASED_OUTPATIENT_CLINIC_OR_DEPARTMENT_OTHER): Payer: No Typology Code available for payment source | Admitting: General Surgery

## 2023-01-15 DIAGNOSIS — M86671 Other chronic osteomyelitis, right ankle and foot: Secondary | ICD-10-CM | POA: Diagnosis not present

## 2023-01-15 DIAGNOSIS — E11621 Type 2 diabetes mellitus with foot ulcer: Secondary | ICD-10-CM | POA: Diagnosis not present

## 2023-01-15 LAB — GLUCOSE, CAPILLARY
Glucose-Capillary: 179 mg/dL — ABNORMAL HIGH (ref 70–99)
Glucose-Capillary: 189 mg/dL — ABNORMAL HIGH (ref 70–99)

## 2023-01-15 NOTE — Progress Notes (Signed)
[name] ([id number]) [id number] Page 1 of 2 Visit Report for 01/15/2023 Arrival Information Details Patient Name: Date of Service: [place], [name] 01/15/2023 12:00 PM Medical Record Number: [id number] Patient Account Number: [id number] Date of Birth/Sex: Treating RN: 1950/11/06 (72 y.o. [name] Primary Care Demarus Latterell: [name] Other Other Clinician: [name] Referring Syre Knerr: Treating Alishea Beaudin/Extender: Gracy Racer in Treatment: 13 Visit Information History Since Last Visit All ordered tests and consults were completed: Yes Patient Arrived: Cane Added or deleted any medications: No Arrival Time: 11:25 Any new allergies or adverse reactions: No Accompanied By: self Had a fall or experienced change in No Transfer Assistance: None activities of daily living that may affect Patient Identification Verified: Yes risk of falls: Secondary Verification Process Completed: Yes Signs or symptoms of abuse/neglect since last visito No Patient Requires Transmission-Based Precautions: No Hospitalized since last visit: No Patient Has Alerts: No Implantable device outside of the clinic excluding No cellular tissue based products placed in the center since last visit: Pain Present Now: No Electronic Signature(s) Signed: 01/15/2023 1:49:41 PM By: [name] CHT EMT BS , , Entered By: [name] on 01/15/2023 13:49:41 -------------------------------------------------------------------------------- Encounter Discharge Information Details Patient Name: Date of Service: [name], [name] 01/15/2023 12:00 PM Medical Record Number: [id number] Patient Account Number: [id number] Date of Birth/Sex: Treating RN: 03-Aug-1951 (72 y.o. [name] Primary Care Najiyah Paris: [name] Other Other Clinician: [name] Referring Lavan Imes: Treating Earland Reish/Extender: [name] Weeks in Treatment:  13 Encounter Discharge Information Items Discharge Condition: Stable Ambulatory Status: Cane Discharge Destination: Home Transportation: Private Auto Accompanied By: None Schedule Follow-up Appointment: Yes Clinical Summary of Care: Electronic Signature(s) Signed: 01/15/2023 5:40:02 PM By: [name] CHT EMT BS , , Previous Signature: 01/15/2023 2:32:55 PM Version By: [name] EMT Entered By: [name] on 01/15/2023 17:40:02 -------------------------------------------------------------------------------- Vitals Details Patient Name: Date of Service: [name], [name] 01/15/2023 12:00 PM Medical Record Number: [id number] Patient Account Number: [id number] Date of Birth/Sex: Treating RN: [birth year] (72 y.o. [name] Primary Care Caroline Matters: [name] Other Other Clinician: [name] Referring Kadeen Sroka: Treating Leanza Shepperson/Extender: [name] Weeks in Treatment: 13 Vital Signs Time Taken: 10:30 Temperature (F): 98.0 [place], [place] ([id number]) [id number] Page 2 of 2 Height (in): 72 Pulse (bpm): 93 Weight (lbs): 185 Respiratory Rate (breaths/min): 18 Body Mass Index (BMI): 25.1 Blood Pressure (mmHg): 141/66 Capillary Blood Glucose (mg/dl): 409 Reference Range: 80 - 120 mg / dl Electronic Signature(s) Signed: 01/15/2023 1:50:15 PM By: [name] CHT EMT BS , , Entered By: [name] on 01/15/2023 13:50:15

## 2023-01-15 NOTE — Progress Notes (Signed)
[name], [name] ([id number]) [id number] Page 1 of 2 Visit Report for 01/15/2023 HBO Details Patient Name: Date of Service: [name] [name], [name] 01/15/2023 12:00 PM Medical Record Number: [id number] Patient Account Number: [id number] Date of Birth/Sex: Treating RN: [birth year] (72 y.o. 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 HBO Treatment Details Treatment Number: 40 Patient Type: Outpatient Chamber Type: Monoplace Chamber Serial #: [id number] Treatment Protocol: 2.5 ATA with 90 minutes oxygen, with two 5 minute air breaks Treatment Details Compression Rate Down: 2.0 psi / minute De-Compression Rate Up: 2.0 psi / minute A breaks and breathing ir Compress Tx Pressure periods Decompress Decompress Begins Reached (leave unused spaces Begins Ends blank) Chamber Pressure (ATA 1 2.5 2.5 2.5 2.5 2.5 - - 2.5 1 ) Clock Time (24 hr) 12:08 12:18 12:48 12:53 13:23 13:28 - - 13:58 14:09 Treatment Length: 121 (minutes) Treatment Segments: 4 Vital Signs Capillary Blood Glucose Reference Range: 80 - 120 mg / dl HBO Diabetic Blood Glucose Intervention Range: <131 mg/dl or >782 mg/dl Type: Time Vitals Blood Respiratory Capillary Blood Glucose Pulse Action Pulse: Temperature: Taken: Pressure: Rate: Glucose (mg/dl): Meter #: Oximetry (%) Taken: Pre 10:30 141/66 93 18 98 179 1 none per protocol Post 14:13 143/67 84 18 97.5 189 1 none per protocol Treatment Response Treatment Toleration: Well Treatment Completion Status: Treatment Completed without Adverse Event Treatment Notes Patient arrived, blood  glucose level was checked with result of 179 mg/dL. After performing safety check, patient was placed in the chamber which was compressed with 100% oxygen at a rate of 2 psi/min after confirming normal ear equalization. Mr. Tosi tolerated the treatment and subsequent decompression of the chamber at 2 psi/min as well. Post-treatment vital signs were normal. Patient was stable upon discharge. Physician HBO Attestation: I certify that I supervised this HBO treatment in accordance with Medicare guidelines. A trained emergency response team is readily available per Yes hospital policies and procedures. Continue HBOT as ordered. Yes Electronic Signature(s) Signed: 01/16/2023 7:51:15 AM By: [name] MD FACS Previous Signature: 01/15/2023 5:39:37 PM Version By: [name] CHT EMT BS , , Previous Signature: 01/15/2023 2:35:40 PM Version By: [name] MD FACS Previous Signature: 01/15/2023 2:31:36 PM Version By: [name] EMT Entered By: [name] on 01/16/2023 07:51:15 -------------------------------------------------------------------------------- HBO Safety Checklist Details Patient Name: Date of Service: [name], [name] 01/15/2023 12:00 PM Medical Record Number: [id number] Patient Account Number: [id number] Date of Birth/Sex: Treating RN: 12-07-1950 (72 y.o. [name] [place], [name] ([id number]) [id number] Page 2 of 2 Primary Care Anatasia Tino: [name] Other Other Clinician: [name] Referring [name]: Treating [name]/Extender: [name] Weeks in Treatment: 13 HBO Safety Checklist Items Safety Checklist Consent Form Signed Patient voided / foley secured and emptied When did you last eato 1030 Last dose of injectable or oral agent Yesterday Ostomy pouch emptied and vented if applicable NA All implantable devices assessed, documented and approved NA Intravenous access site secured and  place NA Valuables secured Linens and cotton and cotton/polyester blend (less than 51% polyester) Personal oil-based products / skin lotions / body lotions removed Wigs or hairpieces removed NA Smoking or tobacco materials removed NA Books / newspapers / magazines / loose paper removed Cologne, aftershave, perfume and  deodorant removed Jewelry removed (may wrap wedding band) Make-up removed Hair care products removed NA Battery operated devices (external) removed Heating patches and chemical warmers removed Titanium eyewear removed Nail polish cured greater than 10 hours NA Casting material cured greater than 10 hours NA Hearing aids removed NA Loose dentures or partials removed dentures removed Prosthetics have been removed NA Patient demonstrates correct use of air break device (if applicable) Patient concerns have been addressed Patient grounding bracelet on and cord attached to chamber Specifics for Inpatients (complete in addition to above) Medication sheet sent with patient NA Intravenous medications needed or due during therapy sent with patient NA Drainage tubes (e.g. nasogastric tube or chest tube secured and vented) NA Endotracheal or Tracheotomy tube secured NA Cuff deflated of air and inflated with saline NA Airway suctioned NA Notes Paper version used prior to treatment start. Electronic Signature(s) Signed: 01/15/2023 1:51:21 PM By: [name] CHT EMT BS , , Entered By: [name] on 01/15/2023 13:51:21

## 2023-01-15 NOTE — Progress Notes (Signed)
FED, VILLAFUERTE ([id number]) [id number] Page 1 of 2 Visit Report for 01/15/2023 Problem List Details Patient Name: Date of Service: [name], [name] 01/15/2023 12:00 PM Medical Record Number: [id number] Patient Account Number: [id number] Date of Birth/Sex: Treating RN: 1951/02/08 (72 y.o. 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.621 Type 2 diabetes mellitus with foot ulcer 10/12/2022 No Yes N18.5 Chronic kidney disease, stage 5 10/12/2022 No Yes M86.9 Osteomyelitis, unspecified 10/12/2022 No Yes E11.42 Type 2 diabetes mellitus with diabetic polyneuropathy 10/12/2022 No Yes I10 Essential (primary) hypertension 10/12/2022 No Yes Inactive Problems Resolved Problems Electronic Signature(s) Signed: 01/15/2023 2:32:16 PM By: [name] EMT Signed: 01/15/2023 2:35:13 PM By: [name] MD FACS Entered By: [name] on 01/15/2023 14:32:15 -------------------------------------------------------------------------------- SuperBill Details Patient Name: Date of Service: [facility] [name], [name] 01/15/2023 Medical Record Number: [id number] Patient Account Number: [id number] Date of Birth/Sex: Treating RN: [birth year] (72 y.o. 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.621 Type 2 diabetes mellitus with foot ulcer Menge, Rune ([id number])  [phone].pdf Page 2 of 2 N18.5 Chronic kidney disease, stage 5 M86.9 Osteomyelitis, unspecified E11.42 Type 2 diabetes mellitus with diabetic polyneuropathy I10 Essential (primary) hypertension Facility Procedures : CPT4 Code Description: [id number] G0277-(Facility Use Only) HBOT full body chamber, , ICD-10 Diagnosis Description M86.9 Osteomyelitis, unspecified L97.514 Non-pressure chronic ulcer of other part of right foo E11.42 Type 2 diabetes mellitus with  diabetic polyneuropathy E11.621 Type 2 diabetes mellitus with foot ulcer Modifier: t with necrosis Quantity: 4 of bone Physician Procedures : CPT4 Code Description Modifier [id number] - WC PHYS HYPERBARIC OXYGEN THERAPY ICD-10 Diagnosis Description M86.9 Osteomyelitis, unspecified 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

## 2023-01-16 ENCOUNTER — Encounter (HOSPITAL_BASED_OUTPATIENT_CLINIC_OR_DEPARTMENT_OTHER): Payer: No Typology Code available for payment source | Admitting: General Surgery

## 2023-01-16 DIAGNOSIS — M86671 Other chronic osteomyelitis, right ankle and foot: Secondary | ICD-10-CM | POA: Diagnosis not present

## 2023-01-16 DIAGNOSIS — E11621 Type 2 diabetes mellitus with foot ulcer: Secondary | ICD-10-CM | POA: Diagnosis not present

## 2023-01-16 LAB — GLUCOSE, CAPILLARY
Glucose-Capillary: 118 mg/dL — ABNORMAL HIGH (ref 70–99)
Glucose-Capillary: 119 mg/dL — ABNORMAL HIGH (ref 70–99)
Glucose-Capillary: 140 mg/dL — ABNORMAL HIGH (ref 70–99)

## 2023-01-16 NOTE — Progress Notes (Signed)
[name], [name] ([id number]) [id number] Page 1 of 2 Visit Report for 01/16/2023 Arrival Information Details Patient Name: Date of Service: [place], [name] 01/16/2023 12:00 PM Medical Record Number: [id number] Patient Account Number: [id number] Date of Birth/Sex: Treating RN: 09-16-1951 (72 y.o. [name] Primary Care Eddy Liszewski: [name] Other Other Clinician: [name] Referring Ikran Patman: Treating Everson Mott/Extender: [name] Weeks in Treatment: 13 Visit Information History Since Last Visit All ordered tests and consults were completed: Yes Patient Arrived: Cane Added or deleted any medications: No Arrival Time: 11:31 Any new allergies or adverse reactions: No Accompanied By: None Had a fall or experienced change in No Transfer Assistance: None activities of daily living that may affect Patient Identification Verified: Yes risk of falls: Secondary Verification Process Completed: Yes Signs or symptoms of abuse/neglect since last visito No Patient Requires Transmission-Based Precautions: No Hospitalized since last visit: No Patient Has Alerts: No Implantable device outside of the clinic excluding No cellular tissue based products placed in the center since last visit: Pain Present Now: No Electronic Signature(s) Signed: 01/16/2023 12:45:09 PM By: [name] EMT Entered By: [name] on 01/16/2023 12:45:09 -------------------------------------------------------------------------------- Encounter Discharge Information Details Patient Name: Date of Service: [facility] [name], [name] 01/16/2023 12:00 PM Medical Record Number: [id number] Patient Account Number: [id number] Date of Birth/Sex: Treating RN: 03/27/1951 (72 y.o. [name] Primary Care Kerryann Allaire: [name] Other Other Clinician: [name] Referring Hillery Bhalla: Treating Kavita Bartl/Extender: [name] Weeks in Treatment: 13 Encounter Discharge  Information Items Discharge Condition: Stable Ambulatory Status: Cane Discharge Destination: Home Transportation: Private Auto Accompanied By: None Schedule Follow-up Appointment: Yes Clinical Summary of Care: Electronic Signature(s) Signed: 01/16/2023 2:41:31 PM By: [name] EMT Entered By: [name] on 01/16/2023 14:41:31 -------------------------------------------------------------------------------- Vitals Details Patient Name: Date of Service: [facility] [name], [name] 01/16/2023 12:00 PM Medical Record Number: [id number] Patient Account Number: [id number] Date of Birth/Sex: Treating RN: 22-Mar-1951 (72 y.o. [name] Primary Care Sarra Rachels: [name] Other Other Clinician: [name] Referring Shawn Dannenberg: Treating Shelene Krage/Extender: [name] Weeks in Treatment: 13 Vital Signs Time Taken: 11:37 Capillary Blood Glucose (mg/dl): 244 [name], [name] ([id number]) [phone].pdf Page 2 of 2 Height (in): 72 Reference Range: 80 - 120 mg / dl Weight (lbs): 606 Body Mass Index (BMI): 25.1 Electronic Signature(s) Signed: 01/16/2023 12:45:29 PM By: [name] EMT Entered By: [name] on 01/16/2023 12:45:29

## 2023-01-16 NOTE — Progress Notes (Addendum)
Centrahoma, Mylo ([id number]) [id number] Page 1 of 2 Visit Report for 01/16/2023 HBO Details Patient Name: Date of Service: [name] 01/16/2023 12:00 PM Medical Record Number: [id number] Patient Account Number: [id number] Date of Birth/Sex: Treating RN: 05-14-1951 (72 y.o. 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 HBO Treatment Details Treatment Number: 41 Patient Type: Outpatient Chamber Type: Monoplace Chamber Serial #: [id number] Treatment Protocol: 2.5 ATA with 90 minutes oxygen, with two 5 minute air breaks Treatment Details Compression Rate Down: 2.0 psi / minute De-Compression Rate Up: A breaks and breathing ir Compress Tx Pressure periods Decompress Decompress Begins Reached (leave unused spaces Begins Ends blank) Chamber Pressure (ATA 1 2.5 2.5 2.5 2.5 2.5 - - 2.5 1 ) Clock Time (24 hr) 12:18 12:33 13:03 13:08 13:38 13:43 - - 14:13 14:24 Treatment Length: 126 (minutes) Treatment Segments: 4 Vital Signs Capillary Blood Glucose Reference Range: 80 - 120 mg / dl HBO Diabetic Blood Glucose Intervention Range: <131 mg/dl or >782 mg/dl Type: Time Vitals Blood Pulse: Respiratory Capillary Blood Glucose Pulse Action Temperature: Taken: Pressure: Rate: Glucose (mg/dl): Meter #: Oximetry (%) Taken: Pre 11:37 118 Patient given 8 oz glucerna Pre 12:08 144/57 91 18 98 119 Patient given 8 oz glucerna Post 14:28 134/72 87 18 98.2 140 Treatment Response Treatment Toleration: Well Treatment Completion Status: Treatment Completed without Adverse Event Treatment Notes On the  patient arrival after his Dialysis treatment blood sugar was 118. The patient was given 8 oz of Glucerna to drink. At 1208 Blood sugar recheck was 119. I spoke with Dr. Lady Gary. Dr. Lady Gary ordered another 8 oz of Glucerna and to start treatment. The patient was place into the chamber with orange juice if it is needed. No problems noted during treatment. Physician HBO Attestation: I certify that I supervised this HBO treatment in accordance with Medicare guidelines. A trained emergency response team is readily available per Yes hospital policies and procedures. Continue HBOT as ordered. Yes Electronic Signature(s) Signed: 01/16/2023 3:26:27 PM By: [name] Guess MD FACS Previous Signature: 01/16/2023 2:40:06 PM Version By: [name] EMT Previous Signature: 01/16/2023 12:52:10 PM Version By: [name] EMT Entered By: [name] on 01/16/2023 15:26:26 -------------------------------------------------------------------------------- HBO Safety Checklist Details Patient Name: Date of Service: [facility] [name], [name] 01/16/2023 12:00 PM Medical Record Number: [id number] Patient Account Number: [id number] [name] ([id number]) [id number] Page 2 of 2 Date of Birth/Sex: Treating RN: 01/16/1951 (72 y.o. [name] Primary Care Allizon Woznick: Other Clinician: Humberto Leep Referring Weslee Prestage: Treating Marylan Glore/Extender: [name] Weeks in Treatment: 13 HBO Safety Checklist Items Safety Checklist Consent Form Signed Patient voided / foley secured and emptied When did you last eato 0600 Last dose of injectable or oral agent Yesterday Ostomy pouch emptied and vented if applicable NA All implantable devices assessed, documented and approved Dialysis shunt Intravenous access site secured and place dialysis shunt Valuables secured Linens and cotton and cotton/polyester blend (less than 51% polyester) Personal oil-based products  / skin lotions / body lotions removed Wigs or hairpieces removed NA Smoking or tobacco materials removed Books / newspapers / magazines / loose paper removed Cologne, aftershave, perfume and deodorant removed Jewelry removed (may wrap wedding band) Make-up removed NA Hair care  products removed Battery operated devices (external) removed Heating patches and chemical warmers removed Titanium eyewear removed NA Nail polish cured greater than 10 hours NA Casting material cured greater than 10 hours NA Hearing aids removed NA Loose dentures or partials removed removed by patient Prosthetics have been removed NA Patient demonstrates correct use of air break device (if applicable) Patient concerns have been addressed Patient grounding bracelet on and cord attached to chamber Specifics for Inpatients (complete in addition to above) Medication sheet sent with patient NA Intravenous medications needed or due during therapy sent with patient NA Drainage tubes (e.g. nasogastric tube or chest tube secured and vented) NA Endotracheal or Tracheotomy tube secured NA Cuff deflated of air and inflated with saline NA Airway suctioned NA Notes The safety checklist was done before the treatment was started. Electronic Signature(s) Signed: 01/16/2023 12:47:15 PM By: [name] EMT Entered By: [name] on 01/16/2023 12:47:14

## 2023-01-16 NOTE — Progress Notes (Signed)
DURELLE, ZEPEDA ([id number]) [id number] Page 1 of 2 Visit Report for 01/16/2023 Problem List Details Patient Name: Date of Service: [name], [name] 01/16/2023 12:00 PM Medical Record Number: [id number] Patient Account Number: [id number] Date of Birth/Sex: Treating RN: [birth year] (72 y.o. 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.621 Type 2 diabetes mellitus with foot ulcer 10/12/2022 No Yes N18.5 Chronic kidney disease, stage 5 10/12/2022 No Yes M86.9 Osteomyelitis, unspecified 10/12/2022 No Yes E11.42 Type 2 diabetes mellitus with diabetic polyneuropathy 10/12/2022 No Yes I10 Essential (primary) hypertension 10/12/2022 No Yes Inactive Problems Resolved Problems Electronic Signature(s) Signed: 01/16/2023 2:40:52 PM By: [name] EMT Signed: 01/16/2023 3:26:02 PM By: [name] MD FACS Entered By: [name] on 01/16/2023 14:40:52 -------------------------------------------------------------------------------- SuperBill Details Patient Name: Date of Service: [name], [name] 01/16/2023 Medical Record Number: [id number] Patient Account Number: [id number] Date of Birth/Sex: Treating RN: 01-23-1951 (72 y.o. 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.621 Type 2 diabetes mellitus with foot ulcer [name], [name] ([id number])  [phone].pdf Page 2 of 2 N18.5 Chronic kidney disease, stage 5 M86.9 Osteomyelitis, unspecified E11.42 Type 2 diabetes mellitus with diabetic polyneuropathy I10 Essential (primary) hypertension Facility Procedures : CPT4 Code Description: [id number] G0277-(Facility Use Only) HBOT full body chamber, , ICD-10 Diagnosis Description M86.9 Osteomyelitis, unspecified L97.514 Non-pressure chronic ulcer of other part of right foo E11.42 Type 2 diabetes mellitus with  diabetic polyneuropathy E11.621 Type 2 diabetes mellitus with foot ulcer Modifier: t with necrosis Quantity: 4 of bone Physician Procedures : CPT4 Code Description Modifier [id number] - WC PHYS HYPERBARIC OXYGEN THERAPY ICD-10 Diagnosis Description M86.9 Osteomyelitis, unspecified 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

## 2023-01-17 ENCOUNTER — Encounter (HOSPITAL_BASED_OUTPATIENT_CLINIC_OR_DEPARTMENT_OTHER): Payer: No Typology Code available for payment source | Admitting: General Surgery

## 2023-01-17 DIAGNOSIS — M86671 Other chronic osteomyelitis, right ankle and foot: Secondary | ICD-10-CM | POA: Diagnosis not present

## 2023-01-17 DIAGNOSIS — E11621 Type 2 diabetes mellitus with foot ulcer: Secondary | ICD-10-CM | POA: Diagnosis not present

## 2023-01-17 LAB — GLUCOSE, CAPILLARY
Glucose-Capillary: 201 mg/dL — ABNORMAL HIGH (ref 70–99)
Glucose-Capillary: 184 mg/dL — ABNORMAL HIGH (ref 70–99)

## 2023-01-17 NOTE — Progress Notes (Signed)
RULON, ABDALLA ([id number]) [id number] Page 1 of 2 Visit Report for 01/17/2023 Arrival Information Details Patient Name: Date of Service: [place], [name] 01/17/2023 12:00 PM Medical Record Number: [id number] Patient Account Number: [id number] Date of Birth/Sex: Treating RN: [birth year] (72 y.o. [name], Millard.Loa Primary Care Jolanda Mccann: [name] Other Other Clinician: [name] Referring Zainab Crumrine: Treating Mauriana Dann/Extender: Roselie Awkward Weeks in Treatment: 13 Visit Information History Since Last Visit All ordered tests and consults were completed: Yes Patient Arrived: Cane Added or deleted any medications: No Arrival Time: 11:23 Any new allergies or adverse reactions: No Accompanied By: None Had a fall or experienced change in No Transfer Assistance: None activities of daily living that may affect Patient Identification Verified: Yes risk of falls: Secondary Verification Process Completed: Yes Signs or symptoms of abuse/neglect since last visito No Patient Requires Transmission-Based Precautions: No Hospitalized since last visit: No Patient Has Alerts: No Implantable device outside of the clinic excluding No cellular tissue based products placed in the center since last visit: Pain Present Now: No Electronic Signature(s) Signed: 01/17/2023 2:38:06 PM By: [name] EMT Entered By: [name] on 01/17/2023 14:38:06 -------------------------------------------------------------------------------- Encounter Discharge Information Details Patient Name: Date of Service: [facility] [name], [name] 01/17/2023 12:00 PM Medical Record Number: [id number] Patient Account Number: [id number] Date of Birth/Sex: Treating RN: Mar 13, 1951 (72 y.o. [name] Primary Care Keyen Marban: [name] Other Other Clinician: [name] Referring Armaan Pond: Treating Saria Haran/Extender: [name] Weeks in Treatment: 13 Encounter Discharge  Information Items Discharge Condition: Stable Ambulatory Status: Cane Discharge Destination: Home Transportation: Private Auto Accompanied By: None Schedule Follow-up Appointment: Yes Clinical Summary of Care: Electronic Signature(s) Signed: 01/17/2023 4:06:49 PM By: [name] MD FACS Previous Signature: 01/17/2023 2:44:14 PM Version By: [name] EMT Entered By: [name] on 01/17/2023 16:06:49 -------------------------------------------------------------------------------- Patient/Caregiver Education Details Patient Name: Date of Service: [name], [name] 4/17/2024andnbsp12:00 PM Medical Record Number: [id number] Patient Account Number: [id number] Date of Birth/Gender: Treating RN: 1951/08/05 (72 y.o. [name] Primary Care Physician: [name] Other Other Clinician: [name] Referring Physician: Treating Physician/Extender: [name] Weeks in Treatment: [address] [place], [name] ([id number]) [id number] Page 2 of 2 Education Provided To: Patient Education Topics Provided Psychologist, prison and probation services) Signed: 01/17/2023 4:12:23 PM By: [name] MD FACS Entered By: [name] on 01/17/2023 16:06:39 -------------------------------------------------------------------------------- Vitals Details Patient Name: Date of Service: [facility] [name], [name] 01/17/2023 12:00 PM Medical Record Number: [id number] Patient Account Number: [id number] Date of Birth/Sex: Treating RN: 1951/03/14 (72 y.o. [name] Primary Care Varina Hulon: [name] Other Other Clinician: [name] Referring Jonetta Dagley: Treating Kiah Vanalstine/Extender: Roselie Awkward Weeks in Treatment: 13 Vital Signs Time Taken: 11:26 Capillary Blood Glucose (mg/dl): 536 Height (in): 72 Reference Range: 80 - 120 mg / dl Weight (lbs): 644 Body Mass Index (BMI): 25.1 Electronic Signature(s) Signed: 01/17/2023 2:38:31 PM By: [name] EMT Entered By: [name] on 01/17/2023 14:38:31

## 2023-01-17 NOTE — Progress Notes (Signed)
[place], [place] ([id number]) [id number] Page 1 of 2 Visit Report for 01/17/2023 HBO Details Patient Name: Date of Service: [facility] [name], [name] 01/17/2023 12:00 PM Medical Record Number: [id number] Patient Account Number: [id number] Date of Birth/Sex: Treating RN: 1950/10/15 (72 y.o. 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 HBO Treatment Details Treatment Number: 42 Patient Type: Outpatient Chamber Type: Monoplace Chamber Serial #: [id number] Treatment Protocol: 2.5 ATA with 90 minutes oxygen, with two 5 minute air breaks Treatment Details Compression Rate Down: 2.0 psi / minute De-Compression Rate Up: 2.0 psi / minute A breaks and breathing ir Compress Tx Pressure periods Decompress Decompress Begins Reached (leave unused spaces Begins Ends blank) Chamber Pressure (ATA 1 2.5 2.5 2.5 2.5 2.5 - - 2.5 1 ) Clock Time (24 hr) 11:51 12:04 12:34 12:39 13:09 13:14 - - 13:44 13:55 Treatment Length: 124 (minutes) Treatment Segments: 4 Vital Signs Capillary Blood Glucose Reference Range: 80 - 120 mg / dl HBO Diabetic Blood Glucose Intervention Range: <131 mg/dl or >782 mg/dl Time Vitals Blood Respiratory Capillary Blood Glucose Pulse Action Type: Pulse: Temperature: Taken: Pressure: Rate: Glucose (mg/dl): Meter #: Oximetry (%) Taken: Pre 11:26 201 Post 14:00 152/65 87 18 97.7 184 Pre 11:35 156/62 91 18 98.4 Treatment Response Treatment Toleration: Well Treatment Completion Status: Treatment Completed without Adverse Event Physician HBO Attestation: I certify that I supervised this HBO  treatment in accordance with Medicare guidelines. A trained emergency response team is readily available per Yes hospital policies and procedures. Continue HBOT as ordered. Yes Electronic Signature(s) Signed: 01/17/2023 4:07:11 PM By: [name] MD FACS Previous Signature: 01/17/2023 4:06:30 PM Version By: [name] MD FACS Previous Signature: 01/17/2023 2:41:29 PM Version By: [name] EMT Entered By: [name] on 01/17/2023 16:07:11 -------------------------------------------------------------------------------- HBO Safety Checklist Details Patient Name: Date of Service: [facility], [name] 01/17/2023 12:00 PM Medical Record Number: [id number] Patient Account Number: [id number] Date of Birth/Sex: Treating RN: [birth year] (72 y.o. [name] Primary Care Susann Lawhorne: [name] Other Other Clinician: [name] Referring Francyne Arreaga: Treating Marguarite Markov/Extender: [name] Weeks in Treatment: [address] [name] ([id number]) [id number] Page 2 of 2 HBO Safety Checklist Items Safety Checklist Consent Form Signed Patient voided / foley secured and emptied When did you last eato 1100 Last dose of injectable or oral agent Yesterday Ostomy pouch emptied and vented if applicable NA All implantable devices assessed, documented and approved Dialysis shunt Intravenous access site secured and place NA Valuables secured Linens and cotton and cotton/polyester blend (less than 51% polyester) Personal oil-based products / skin lotions / body lotions removed Wigs or hairpieces removed NA Smoking or tobacco materials removed Books / newspapers / magazines / loose paper removed Cologne, aftershave, perfume and deodorant removed Jewelry removed (may wrap wedding band) Make-up removed NA Hair care products removed Battery operated devices (external) removed Heating patches and chemical warmers removed Titanium eyewear removed NA Nail  polish cured greater than 10 hours NA Casting material cured greater than 10 hours NA Hearing aids removed NA Loose dentures or partials removed removed by patient Prosthetics have been removed NA Patient demonstrates correct use of air break device (if applicable) Patient concerns have been addressed Patient grounding bracelet on and cord attached to chamber  Specifics for Inpatients (complete in addition to above) Medication sheet sent with patient NA Intravenous medications needed or due during therapy sent with patient NA Drainage tubes (e.g. nasogastric tube or chest tube secured and vented) NA Endotracheal or Tracheotomy tube secured NA Cuff deflated of air and inflated with saline NA Airway suctioned NA Notes The safety checklist was done before the treatment was started. Electronic Signature(s) Signed: 01/17/2023 4:07:02 PM By: [name] MD FACS Previous Signature: 01/17/2023 2:39:54 PM Version By: [name] EMT Entered By: [name] on 01/17/2023 16:07:02

## 2023-01-18 ENCOUNTER — Encounter (HOSPITAL_BASED_OUTPATIENT_CLINIC_OR_DEPARTMENT_OTHER): Payer: No Typology Code available for payment source | Admitting: General Surgery

## 2023-01-18 ENCOUNTER — Telehealth: Payer: Self-pay

## 2023-01-18 NOTE — Progress Notes (Signed)
GAVON, MAJANO ([id number]) [id number] Page 1 of 2 Visit Report for 01/17/2023 Physician Orders Details Patient Name: Date of Service: [facility] [name], [name] 01/17/2023 12:00 PM Medical Record Number: [id number] Patient Account Number: [id number] Date of Birth/Sex: Treating RN: 1951-07-21 (72 y.o. 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.5 Chronic kidney disease, stage 5 M86.9 Osteomyelitis, unspecified E11.42 Type 2 diabetes mellitus with diabetic polyneuropathy I10 Essential (primary) hypertension Wound Treatment Electronic Signature(s) Signed: 01/17/2023 4:06:20 PM By: [name] MD FACS Entered By: [name] on 01/17/2023 16:06:20 -------------------------------------------------------------------------------- Problem List Details Patient Name: Date of Service: [facility] [name], [name] 01/17/2023 12:00 PM Medical Record Number: [id number] Patient Account Number: [id number] Date of Birth/Sex: Treating RN: 06/16/1951 (72 y.o. [name] Primary Care Provider: [name] Other Other Clinician: [name] Referring Provider: Treating Provider/Extender: [name] Weeks in Treatment: 13 Active Problems ICD-10 Encounter Code Description Active Date MDM Diagnosis L97.514 Non-pressure chronic ulcer of other part of right foot with 10/12/2022 No Yes necrosis of bone E11.621 Type 2 diabetes mellitus with foot ulcer 10/12/2022 No Yes N18.5 Chronic kidney disease, stage 5 10/12/2022 No Yes M86.9 Osteomyelitis, unspecified 10/12/2022 No Yes E11.42 Type 2 diabetes mellitus with diabetic  polyneuropathy 10/12/2022 No Yes I10 Essential (primary) hypertension 10/12/2022 No Yes [facility], Naitik ([id number]) [id number] Page 2 of 2 Inactive Problems Resolved Problems Electronic Signature(s) Signed: 01/17/2023 4:06:12 PM By: [name] MD FACS Previous Signature: 01/17/2023 2:42:14 PM Version By: [name] EMT Entered By: [name] on 01/17/2023 16:06:12 -------------------------------------------------------------------------------- SuperBill Details Patient Name: Date of Service: [facility] [name], [name] 01/17/2023 Medical Record Number: [id number] Patient Account Number: [id number] Date of Birth/Sex: Treating RN: 06/21/1951 (72 y.o. 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.621 Type 2 diabetes mellitus with foot ulcer N18.5 Chronic kidney disease, stage 5 M86.9 Osteomyelitis, unspecified E11.42 Type 2 diabetes mellitus with diabetic polyneuropathy I10 Essential (primary) hypertension Facility Procedures : CPT4 Code Description: [id number]-(Facility Use Only) HBOT full body chamber, , ICD-10 Diagnosis Description M86.9 Osteomyelitis, unspecified L97.514 Non-pressure chronic ulcer of other part of right foot wi E11.42 Type 2 diabetes mellitus with  diabetic polyneuropathy E11.621 Type 2 diabetes mellitus with foot ulcer Modifier: th necrosis Quantity: 4 of bone Physician Procedures : CPT4 Code Description Modifier [id number] - WC PHYS HYPERBARIC OXYGEN THERAPY ICD-10 Diagnosis Description M86.9 Osteomyelitis, unspecified L97.514 Non-pressure chronic ulcer of other part of right foot with necrosis o E11.42 Type 2 diabetes  mellitus with diabetic  polyneuropathy E11.621 Type 2 diabetes mellitus with foot ulcer Quantity: 1 f bone Electronic Signature(s) Signed: 01/17/2023 4:07:22 PM By: [name] MD FACS Signed: 01/17/2023 6:42:55 PM By: [name] Derry PA-C Previous Signature: 01/17/2023 2:42:09 PM Version By: [name] EMT Entered By: [name] on 01/17/2023 16:07:21

## 2023-01-18 NOTE — Telephone Encounter (Signed)
Prescription Request  01/18/2023  LOV: Visit date not found  What is the name of the medication or equipment? atorvastatin (LIPITOR) 80 MG tablet   Have you contacted your pharmacy to request a refill? Yes   Which pharmacy would you like this sent to?  [facility] [address], [place] - [address] [place] [address] Phone: [phone] Fax: [phone]    Patient notified that their request is being sent to the clinical staff for review and that they should receive a response within 2 business days.   Please advise at Mobile [phone] (mobile)

## 2023-01-19 ENCOUNTER — Encounter (HOSPITAL_BASED_OUTPATIENT_CLINIC_OR_DEPARTMENT_OTHER): Payer: No Typology Code available for payment source | Admitting: General Surgery

## 2023-01-19 ENCOUNTER — Other Ambulatory Visit: Payer: Self-pay

## 2023-01-19 DIAGNOSIS — L97514 Non-pressure chronic ulcer of other 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: Secondary | ICD-10-CM | POA: Diagnosis not present

## 2023-01-19 DIAGNOSIS — E11621 Type 2 diabetes mellitus with foot ulcer: Secondary | ICD-10-CM | POA: Diagnosis not present

## 2023-01-19 DIAGNOSIS — M86671 Other chronic osteomyelitis, right ankle and foot: Secondary | ICD-10-CM | POA: Diagnosis not present

## 2023-01-19 LAB — GLUCOSE, CAPILLARY: Glucose-Capillary: 138 mg/dL — ABNORMAL HIGH (ref 70–99)

## 2023-01-19 MED ORDER — ATORVASTATIN CALCIUM 80 MG PO TABS: 80.0000 mg | ORAL_TABLET | Freq: Every day | ORAL | 1 refills | Status: DC

## 2023-01-20 NOTE — Progress Notes (Addendum)
[place], Dash ([id number]) [id number] Page 1 of 2 Visit Report for 01/19/2023 Arrival Information Details Patient Name: Date of Service: [name], [name] [name] 01/19/2023 12:00 PM Medical Record Number: [id number] Patient Account Number: [id number] Date of Birth/Sex: Treating RN: 07-11-1951 (71 y.o. [name], [name] Primary Care [name]: [name] Other Other Clinician: Haywood [name] Referring [name]: Treating [name]/Extender: [name] [name]: 14 Visit Information History Since Last Visit All ordered tests and consults were completed: Yes Patient Arrived: Cane Added or deleted any medications: No Arrival Time: 13:06 Any new allergies or adverse reactions: No Accompanied By: spouse Had a fall or experienced change in No Transfer Assistance: None activities of daily living that may affect Patient Identification Verified: Yes risk of falls: Secondary Verification Process Completed: Yes Signs or symptoms of abuse/neglect since last visito No Patient Requires Transmission-Based Precautions: No Hospitalized since last visit: No Patient Has Alerts: No Implantable device outside of the clinic excluding No cellular tissue based products placed in the center since last visit: Pain Present Now: No Electronic Signature(s) Signed: 01/19/2023 1:48:14 PM By: [name] CHT EMT BS , , Entered By: [name] on 01/19/2023 13:48:14 -------------------------------------------------------------------------------- Encounter Discharge Information Details Patient Name: Date of Service: [name] [name] [name] 01/19/2023 12:00 PM Medical Record Number: [id number] Patient Account Number: [id number] Date of Birth/Sex: Treating RN: 07/18/1951 (72 y.o. [name] Primary Care Kane Kusek: [name] Other Other Clinician: Haywood [name] Referring [name]: Treating Marianny Goris/Extender: [name] [name]:  14 Encounter Discharge Information Items Discharge Condition: Stable Ambulatory Status: Cane Discharge Destination: Home Transportation: Private Auto Accompanied By: self Schedule Follow-up Appointment: No Clinical Summary of Care: Electronic Signature(s) Signed: 01/19/2023 3:59:46 PM By: [name] CHT EMT BS , , Entered By: [name] on 01/19/2023 15:59:46 -------------------------------------------------------------------------------- Vitals Details Patient Name: Date of Service: [name] [name] [name] 01/19/2023 12:00 PM Medical Record Number: [id number] Patient Account Number: [id number] Date of Birth/Sex: Treating RN: Apr 26, 1951 (72 y.o. [name] Primary Care Romanita Fager: [name] Other Other Clinician: Haywood [name] Referring Yesha Muchow: Treating [name]/Extender: [name] [name]: 14 Vital Signs Time Taken: 11:39 Temperature (F): 97.2 [name], [name] ([id number]) [phone].pdf Page 2 of 2 Height (in): 72 Pulse (bpm): 91 Weight (lbs): 185 Respiratory Rate (breaths/min): 18 Body Mass Index (BMI): 25.1 Blood Pressure (mmHg): 151/60 Capillary Blood Glucose (mg/dl): 884 Reference Range: 80 - 120 mg / dl Electronic Signature(s) Signed: 01/19/2023 3:49:08 PM By: [name] CHT EMT BS , , Entered By: [name] on 01/19/2023 15:49:08

## 2023-01-20 NOTE — Progress Notes (Signed)
[name], Arbie ([id number]) [id number] Page 1 of 8 Visit Report for 01/19/2023 Arrival Information Details Patient Name: Date of Service: [place], [name] 01/19/2023 10:30 A M Medical Record Number: [id number] Patient Account Number: [id number] Date of Birth/Sex: Treating RN: 07/09/1951 (72 y.o. [name], [name] Primary Care Joaquin Knebel: [name] Other Other Clinician: Referring Kaila Devries: Treating Raykwon Hobbs/Extender: [name] Weeks in Treatment: 14 Visit Information History Since Last Visit Added or deleted any medications: No Patient Arrived: [name] Any new allergies or adverse reactions: No Arrival Time: 10:50 Had a fall or experienced change in No Accompanied By: self activities of daily living that may affect Transfer Assistance: None risk of falls: Patient Identification Verified: Yes Signs or symptoms of abuse/neglect since last visito No Secondary Verification Process Completed: Yes Hospitalized since last visit: No Patient Requires Transmission-Based Precautions: No Implantable device outside of the clinic excluding No Patient Has Alerts: No cellular tissue based products placed in the center since last visit: Has Dressing in Place as Prescribed: Yes Pain Present Now: No Electronic Signature(s) Signed: 01/19/2023 5:46:14 PM By: Zenaida Deed RN, BSN Entered By: Zenaida Deed on 01/19/2023 10:52:27 -------------------------------------------------------------------------------- Encounter Discharge Information Details Patient Name: Date of Service: [facility], [name] 01/19/2023 10:30 A M Medical Record Number: [id number] Patient Account Number: [id number] Date of Birth/Sex: Treating RN: March 16, 1951 (72 y.o. [name] Primary Care Maverik Foot: [name] Other Other Clinician: Referring Ciarra Braddy: Treating Alfonza Toft/Extender: [name] Weeks in Treatment: 14 Encounter Discharge Information Items Post Procedure  Vitals Discharge Condition: Stable Temperature (F): 98.1 Ambulatory Status: Cane Pulse (bpm): 96 Discharge Destination: Home Respiratory Rate (breaths/min): 18 Transportation: Private Auto Blood Pressure (mmHg): 170/70 Accompanied By: self Schedule Follow-up Appointment: Yes Clinical Summary of Care: Patient Declined Electronic Signature(s) Signed: 01/19/2023 5:46:14 PM By: Zenaida Deed RN, BSN Entered By: Zenaida Deed on 01/19/2023 11:24:23 -------------------------------------------------------------------------------- Lower Extremity Assessment Details Patient Name: Date of Service: [facility], [name] 01/19/2023 10:30 A M Medical Record Number: [id number] Patient Account Number: [id number] Date of Birth/Sex: Treating RN: 10-Aug-1951 (72 y.o. [name] Primary Care Danikah Budzik: [name] Other Other Clinician: Referring Kalimah Capurro: Treating Tawan Degroote/Extender: [name] Weeks in Treatment: 14 Edema Assessment Assessed: [Left: No] [Right: No] T[LeftDANNIS, DEROCHE ([id number])] [Right: [id number] Page 2 of 8] Edema: [Left: N] [Right: o] Calf Left: Right: Point of Measurement: From Medial Instep 36 cm Ankle Left: Right: Point of Measurement: From Medial Instep 22 cm Vascular Assessment Pulses: Dorsalis Pedis Palpable: [Right:Yes] Electronic Signature(s) Signed: 01/19/2023 5:46:14 PM By: Zenaida Deed RN, BSN Entered By: Zenaida Deed on 01/19/2023 10:54:39 -------------------------------------------------------------------------------- Multi Wound Chart Details Patient Name: Date of Service: [facility], [name] 01/19/2023 10:30 A M Medical Record Number: [id number] Patient Account Number: [id number] Date of Birth/Sex: Treating RN: 06/10/1951 (72 y.o. M) Primary Care Sunjai Levandoski: [name] Other Other Clinician: Referring Emmalina Espericueta: Treating Consuella Scurlock/Extender: [name] Weeks in Treatment: 14 Vital  Signs Height(in): 72 Pulse(bpm): 96 Weight(lbs): 185 Blood Pressure(mmHg): 170/70 Body Mass Index(BMI): 25.1 Temperature(F): 98.1 Respiratory Rate(breaths/min): 18 [1:Photos: No Photos Right, Medial T Great oe Wound Location: Pressure Injury Wounding Event: Diabetic Wound/Ulcer of the Lower Primary Etiology: Extremity Acute Osteomyelitis Secondary Etiology: Cataracts, Hypertension, Type II Comorbid History: Diabetes,  Osteomyelitis, Neuropathy, Confinement Anxiety 08/16/2022 Date Acquired: 14 Weeks of Treatment: Open Wound Status: No Wound Recurrence: 0.8x0.7x0.3 Measurements L x W x D (cm) 0.44 A (cm) : rea 0.132 Volume (cm) : -60.00% % Reduction in A rea: -20.00%  % Reduction in Volume: Grade 3 Classification: Medium  Exudate A mount: Serosanguineous Exudate Type: red, brown Exudate Color: Distinct, outline attached Wound Margin: Large (67-100%) Granulation A mount: Red Granulation Quality: Small (1-33%) Necrotic A  mount: Fat Layer (Subcutaneous Tissue): Yes N/A Exposed Structures: Tendon: Yes Joint: Yes Bone: Yes Fascia: No Muscle: No Small (1-33%) Epithelialization: Debridement - Selective/Open Wound N/A Debridement: Pre-procedure Verification/Time Out 11:05  Taken:] [N/A:N/A N/A N/A N/A N/A N/A N/A N/A N/A N/A N/A N/A N/A N/A N/A N/A N/A N/A N/A N/A N/A N/A N/A N/A N/A] PERNELL, Aidenjames ([id number]) [1:Lidocaine 4% Topical Solution Pain Control: Slough Tissue Debrided: Non-Viable Tissue Level: 0.56 Debridement A (sq cm): rea Curette Instrument: Minimum Bleeding: Pressure Hemostasis A chieved: 0 Procedural Pain: 0 Post Procedural Pain: Procedure was  tolerated well Debridement Treatment Response: 0.8x0.7x0.3 Post Debridement Measurements L x W x D (cm) 0.132 Post Debridement Volume: (cm) Callus: Yes Periwound Skin Texture: Maceration: Yes Periwound Skin Moisture: No Abnormalities Noted Periwound  Skin Color: No Abnormality Temperature: Cellular or Tissue Based Product Procedures Performed:  Debridement] [N/A:N/A N/A N/A N/A N/A N/A N/A N/A N/A N/A N/A N/A N/A N/A N/A N/A N/A] Treatment Notes Wound #1 (Toe Great) Wound Laterality: Right, Medial Cleanser Soap and Water Discharge Instruction: May shower and wash wound with dial antibacterial soap and water prior to dressing change. Wound Cleanser Discharge Instruction: Cleanse the wound with wound cleanser prior to applying a clean dressing using gauze sponges, not tissue or cotton balls. Peri-Wound Care Topical Primary Dressing Epicord Secondary Dressing ADAPTIC TOUCH 3x4.25 in Discharge Instruction: Apply over primary dressing as directed. Woven Gauze Sponge, Non-Sterile 4x4 in Discharge Instruction: Apply over primary dressing as directed. Secured With Conforming Stretch Gauze Bandage, Sterile 2x75 (in/in) Discharge Instruction: Secure with stretch gauze as directed. 72M Medipore Soft Cloth Surgical T 2x10 (in/yd) ape Discharge Instruction: Secure with tape as directed. Compression Wrap Compression Stockings Add-Ons Electronic Signature(s) Signed: 01/19/2023 11:38:13 AM By: [name] MD FACS Entered By: [name] on 01/19/2023 11:38:13 -------------------------------------------------------------------------------- Multi-Disciplinary Care Plan Details Patient Name: Date of Service: [facility], [name] 01/19/2023 10:30 A M Medical Record Number: [id number] Patient Account Number: [id number] Date of Birth/Sex: Treating RN: Nov 26, 1950 (72 y.o. [name] Primary Care Baby Gieger: [name] Other Other Clinician: Referring Jesslyn Viglione: Treating Averey Trompeter/Extender: [name] Weeks in Treatment: 14 Multidisciplinary Care Plan reviewed with physician [address] [place], [name] ([id number]) [id number] Page 4 of 8 HBO Nursing Diagnoses: Potential for barotraumas to ears, sinuses, teeth, and lungs or cerebral gas embolism related to changes in atmospheric pressure  inside hyperbaric oxygen chamber Potential for oxygen toxicity seizures related to delivery of 100% oxygen at an increased atmospheric pressure Potential for pulmonary oxygen toxicity related to delivery of 100% oxygen at an increased atmospheric pressure Goals: Barotrauma will be prevented during HBO2 Date Initiated: 12/28/2022 T arget Resolution Date: 01/25/2023 Goal Status: Active Patient and/or family will be able to state/discuss factors appropriate to the management of their disease process during treatment Date Initiated: 12/28/2022 T arget Resolution Date: 01/25/2023 Goal Status: Active Patient will tolerate the hyperbaric oxygen therapy treatment Date Initiated: 12/28/2022 T arget Resolution Date: 01/25/2023 Goal Status: Active Patient will tolerate the internal climate of the chamber Date Initiated: 12/28/2022 T arget Resolution Date: 01/25/2023 Goal Status: Active Interventions: Administer the correct therapeutic gas delivery based on the patients needs and limitations, per physician order Assess and provide for patients comfort related to the hyperbaric environment and equalization of middle ear Assess for signs and symptoms related to adverse events, including but not  limited to confinement anxiety, pneumothorax, oxygen toxicity and baurotrauma Assess patient for any history of confinement anxiety Assess patient's knowledge and expectations regarding hyperbaric medicine and provide education related to the hyperbaric environment, goals of treatment and prevention of adverse events Implement protocols to decrease risk of pneumothorax in high risk patients Notes: Nutrition Nursing Diagnoses: Impaired glucose control: actual or potential Goals: Patient/caregiver will maintain therapeutic glucose control Date Initiated: 10/12/2022 Target Resolution Date: 01/25/2023 Goal Status: Active Interventions: Provide education on elevated blood sugars and impact on wound healing Provide  education on nutrition Treatment Activities: Dietary management education, guidance and counseling : 10/12/2022 Giving encouragement to exercise : 10/12/2022 Notes: Osteomyelitis Nursing Diagnoses: Infection: osteomyelitis Goals: Patient/caregiver will verbalize understanding of disease process and disease management Date Initiated: 10/12/2022 Target Resolution Date: 01/25/2023 Goal Status: Active Interventions: Assess for signs and symptoms of osteomyelitis resolution every visit Provide education on osteomyelitis Treatment Activities: MRI : 08/16/2022 Notes: Wound/Skin Impairment Nursing Diagnoses: Impaired tissue integrity Ace, Ethanjames ([id number]) [id number] Page 5 of 8 Goals: Ulcer/skin breakdown will have a volume reduction of 30% by week 4 Date Initiated: 10/12/2022 Target Resolution Date: 01/25/2023 Goal Status: Active Interventions: Assess ulceration(s) every visit Provide education on smoking Treatment Activities: Skin care regimen initiated : 10/12/2022 Smoking cessation education : 10/12/2022 Notes: Electronic Signature(s) Signed: 01/19/2023 5:46:14 PM By: Zenaida Deed RN, BSN Entered By: Zenaida Deed on 01/19/2023 11:04:13 -------------------------------------------------------------------------------- Pain Assessment Details Patient Name: Date of Service: [facility], [name] 01/19/2023 10:30 A M Medical Record Number: [id number] Patient Account Number: [id number] Date of Birth/Sex: Treating RN: 09-04-1951 (72 y.o. [name] Primary Care Alistar Mcenery: [name] Other Other Clinician: Referring Yanely Mast: Treating Merikay Lesniewski/Extender: [name] Weeks in Treatment: 14 Active Problems Location of Pain Severity and Description of Pain Patient Has Paino No Site Locations Rate the pain. Current Pain Level: 0 Pain Management and Medication Current Pain Management: Electronic Signature(s) Signed: 01/19/2023 5:46:14 PM  By: Zenaida Deed RN, BSN Entered By: Zenaida Deed on 01/19/2023 10:53:09 -------------------------------------------------------------------------------- Patient/Caregiver Education Details Patient Name: Date of Service: [name], [name] 4/19/2024andnbsp10:30 A M Medical Record Number: [id number] Patient Account Number: [id number] Date of Birth/Gender: Treating RN: 1951-09-04 (72 y.o. [name] Primary Care Physician: [name] Other Other Clinician: Referring Physician: Treating Physician/Extender: [name] Weeks in Treatment: [name], [name] ([id number]) [id number] Page 6 of 8 Education Assessment Education Provided To: Patient Education Topics Provided Elevated Blood Sugar/ Impact on Healing: Methods: Explain/Verbal Responses: Reinforcements needed, State content correctly Hyperbaric Oxygenation: Methods: Explain/Verbal Responses: Reinforcements needed, State content correctly Wound/Skin Impairment: Methods: Explain/Verbal Responses: Reinforcements needed, State content correctly Electronic Signature(s) Signed: 01/19/2023 5:46:14 PM By: Zenaida Deed RN, BSN Entered By: Zenaida Deed on 01/19/2023 11:05:05 -------------------------------------------------------------------------------- Wound Assessment Details Patient Name: Date of Service: [name], [name] 01/19/2023 10:30 A M Medical Record Number: [id number] Patient Account Number: [id number] Date of Birth/Sex: Treating RN: [birth year] (72 y.o. [name] Primary Care Sevan Mcbroom: [name] Other Other Clinician: Referring Truth Barot: Treating Giannina Bartolome/Extender: [name] Weeks in Treatment: 14 Wound Status Wound Number: 1 Primary Diabetic Wound/Ulcer of the Lower Extremity Etiology: Wound Location: Right, Medial T Great oe Secondary Acute Osteomyelitis Wounding Event: Pressure Injury Etiology: Date Acquired: 08/16/2022 Wound  Open Weeks Of Treatment: 14 Status: Clustered Wound: No Comorbid Cataracts, Hypertension, Type II Diabetes, Osteomyelitis, History: Neuropathy, Confinement Anxiety Photos Wound Measurements Length: (cm) 0.8 Width: (cm) 0.7 Depth: (cm) 0.3 Area: (cm) 0.44 Volume: (cm) 0.132 % Reduction in Area: -60% % Reduction in  Volume: -20% Epithelialization: Small (1-33%) Tunneling: No Undermining: No Wound Description Classification: Grade 3 Wound Margin: Distinct, outline attached Exudate Amount: Medium Exudate Type: Serosanguineous Exudate Color: red, brown Schimek, Ismael ([id number]) Foul Odor After Cleansing: No Slough/Fibrino Yes [id number] Page 7 of 8 Wound Bed Granulation Amount: Large (67-100%) Exposed Structure Granulation Quality: Red Fascia Exposed: No Necrotic Amount: Small (1-33%) Fat Layer (Subcutaneous Tissue) Exposed: Yes Necrotic Quality: Adherent Slough Tendon Exposed: Yes Muscle Exposed: No Joint Exposed: Yes Bone Exposed: Yes Periwound Skin Texture Texture Color No Abnormalities Noted: Yes No Abnormalities Noted: Yes Moisture Temperature / Pain No Abnormalities Noted: Yes Temperature: No Abnormality Treatment Notes Wound #1 (Toe Great) Wound Laterality: Right, Medial Cleanser Soap and Water Discharge Instruction: May shower and wash wound with dial antibacterial soap and water prior to dressing change. Wound Cleanser Discharge Instruction: Cleanse the wound with wound cleanser prior to applying a clean dressing using gauze sponges, not tissue or cotton balls. Peri-Wound Care Topical Primary Dressing Epicord Secondary Dressing ADAPTIC TOUCH 3x4.25 in Discharge Instruction: Apply over primary dressing as directed. Woven Gauze Sponge, Non-Sterile 4x4 in Discharge Instruction: Apply over primary dressing as directed. Secured With Conforming Stretch Gauze Bandage, Sterile 2x75 (in/in) Discharge Instruction: Secure with  stretch gauze as directed. 79M Medipore Soft Cloth Surgical T 2x10 (in/yd) ape Discharge Instruction: Secure with tape as directed. Compression Wrap Compression Stockings Add-Ons Electronic Signature(s) Signed: 01/19/2023 5:46:14 PM By: Zenaida Deed RN, BSN Entered By: Zenaida Deed on 01/19/2023 16:34:07 -------------------------------------------------------------------------------- Vitals Details Patient Name: Date of Service: [name], [name] 01/19/2023 10:30 A M Medical Record Number: [id number] Patient Account Number: [id number] Date of Birth/Sex: Treating RN: [birth year] (72 y.o. [name] Primary Care Franciscojavier Wronski: [name] Other Other Clinician: Referring Lyndal Reggio: Treating Tola Meas/Extender: [name] Weeks in Treatment: 14 Vital Signs Time Taken: 10:52 Temperature (F): 98.1 Height (in): 72 Pulse (bpm): 96 Weight (lbs): 185 Respiratory Rate (breaths/min): 18 Body Mass Index (BMI): 25.1 Blood Pressure (mmHg): 170/70 Reference Range: 80 - 120 mg / dl Prien, Romell ([id number]) [id number] Page 8 of 8 Electronic Signature(s) Signed: 01/19/2023 5:46:14 PM By: Zenaida Deed RN, BSN Entered By: Zenaida Deed on 01/19/2023 10:53:02

## 2023-01-20 NOTE — Progress Notes (Signed)
[place], [place] ([id number]) [id number] Page 1 of 2 Visit Report for 01/19/2023 HBO Details Patient Name: Date of Service: [name], [name] 01/19/2023 12:00 PM Medical Record Number: [id number] Patient Account Number: [id number] Date of Birth/Sex: Treating RN: 13-Jan-1951 (72 y.o. 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 HBO Treatment Details Treatment Number: 43 Patient Type: Outpatient Chamber Type: Monoplace Chamber Serial #: [id number] Treatment Protocol: 2.5 ATA with 90 minutes oxygen, with two 5 minute air breaks Treatment Details Compression Rate Down: 1.5 psi / minute De-Compression Rate Up: 2.0 psi / minute A breaks and breathing ir Compress Tx Pressure periods Decompress Decompress Begins Reached (leave unused spaces Begins Ends blank) Chamber Pressure (ATA 1 2.5 2.5 2.5 2.5 2.5 - - 2.5 1 ) Clock Time (24 hr) 11:49 11:59 12:29 12:34 13:04 13:09 - - 13:39 13:48 Treatment Length: 119 (minutes) Treatment Segments: 4 Vital Signs Capillary Blood Glucose Reference Range: 80 - 120 mg / dl HBO Diabetic Blood Glucose Intervention Range: <131 mg/dl or >782 mg/dl Type: Time Vitals Blood Respiratory Capillary Blood Glucose Pulse Action Pulse: Temperature: Taken: Pressure: Rate: Glucose (mg/dl): Meter #: Oximetry (%) Taken: Pre 11:39 151/60 91 18 97.2 138 1 none per protocol Post 13:52 145/67 88 18 97.8 100 1 none per protocol Treatment Response Treatment Toleration: Well Treatment Completion Status: Treatment Completed without Adverse Event Treatment Notes Patient arrived, vital  signs were within normal limits. Patient prepared for treatment. After performing safety check, patient was placed in the chamber which was compressed at a rate of 2 psi/min. He tolerated the treatment and the decompression that followed (2 psi/min). Post treatment vital signs were within normal range with the exception of blood glucose level at 100 mg/dL. Patient was given 8 oz Glucerna per protocol. After 15 minutes patient was asymptomatic for hypoglycemia and stated that he would stop and get something to eat on the way home. He was stable upon discharge. Physician HBO Attestation: I certify that I supervised this HBO treatment in accordance with Medicare guidelines. A trained emergency response team is readily available per Yes hospital policies and procedures. Continue HBOT as ordered. Yes Electronic Signature(s) Signed: 01/19/2023 4:00:39 PM By: [name] MD FACS Previous Signature: 01/19/2023 3:58:40 PM Version By: [name] CHT EMT BS , , Previous Signature: 01/19/2023 3:51:56 PM Version By: [name] CHT EMT BS , , Entered By: [name] on 01/19/2023 16:00:39 -------------------------------------------------------------------------------- HBO Safety Checklist Details Patient Name: Date of Service: [facility], [name] 01/19/2023 12:00 PM Medical Record Number: [id number] Patient Account Number: [id number] Date of Birth/Sex: Treating RN: 1951-02-11 (72 y.o. [name] [place], [place] ([id number]) [id number] Page 2 of 2 Primary Care Yasser Hepp: [name] Other Other Clinician: [name] Referring Corretta Munce: Treating Belvia Gotschall/Extender: [name] Weeks in Treatment: 14 HBO Safety Checklist Items Safety Checklist Consent Form Signed Patient voided / foley secured and emptied When did you last eato 1100 Last dose of injectable or oral agent last pm Ostomy pouch emptied and vented if applicable NA All  implantable devices assessed, documented and approved NA Intravenous access site secured and place NA Valuables secured Linens and cotton and cotton/polyester blend (less than 51% polyester) Personal oil-based products / skin lotions / body lotions  removed Wigs or hairpieces removed NA Smoking or tobacco materials removed NA Books / newspapers / magazines / loose paper removed Cologne, aftershave, perfume and deodorant removed Jewelry removed (may wrap wedding band) Make-up removed Hair care products removed Battery operated devices (external) removed Heating patches and chemical warmers removed Titanium eyewear removed Nail polish cured greater than 10 hours NA Casting material cured greater than 10 hours NA Hearing aids removed NA Loose dentures or partials removed NA Prosthetics have been removed NA Patient demonstrates correct use of air break device (if applicable) Patient concerns have been addressed Patient grounding bracelet on and cord attached to chamber Specifics for Inpatients (complete in addition to above) Medication sheet sent with patient NA Intravenous medications needed or due during therapy sent with patient NA Drainage tubes (e.g. nasogastric tube or chest tube secured and vented) NA Endotracheal or Tracheotomy tube secured NA Cuff deflated of air and inflated with saline NA Airway suctioned NA Notes Paper version used prior to treatment start. Electronic Signature(s) Signed: 01/19/2023 3:50:47 PM By: [name] CHT EMT BS , , Entered By: [name] on 01/19/2023 15:50:46

## 2023-01-20 NOTE — Progress Notes (Signed)
TRAYDEN, BRANDY ([id number]) [id number] Page 1 of 1 Visit Report for 01/19/2023 SuperBill Details Patient Name: Date of Service: [name], [name] 01/19/2023 Medical Record Number: [id number] Patient Account Number: [id number] Date of Birth/Sex: Treating RN: 27-Aug-1951 (72 y.o. 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, stage 5 M86.9 Osteomyelitis, unspecified E11.42 Type 2 diabetes mellitus with diabetic polyneuropathy I10 Essential (primary) hypertension Facility Procedures CPT4 Code Description Modifier Quantity [id number] G0277-(Facility Use Only) HBOT full body chamber, , 4 ICD-10 Diagnosis Description M86.9 Osteomyelitis, unspecified 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.621 Type 2 diabetes mellitus with foot ulcer Physician Procedures Quantity CPT4 Code Description Modifier [id number] - WC PHYS HYPERBARIC OXYGEN THERAPY 1 ICD-10 Diagnosis Description M86.9 Osteomyelitis, unspecified 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

## 2023-01-22 ENCOUNTER — Encounter (HOSPITAL_BASED_OUTPATIENT_CLINIC_OR_DEPARTMENT_OTHER): Payer: No Typology Code available for payment source | Admitting: General Surgery

## 2023-01-22 DIAGNOSIS — E11621 Type 2 diabetes mellitus with foot ulcer: Secondary | ICD-10-CM | POA: Diagnosis not present

## 2023-01-22 DIAGNOSIS — M86671 Other chronic osteomyelitis, right ankle and foot: Secondary | ICD-10-CM | POA: Diagnosis not present

## 2023-01-22 LAB — GLUCOSE, CAPILLARY
Glucose-Capillary: 100 mg/dL — ABNORMAL HIGH (ref 70–99)
Glucose-Capillary: 178 mg/dL — ABNORMAL HIGH (ref 70–99)
Glucose-Capillary: 182 mg/dL — ABNORMAL HIGH (ref 70–99)

## 2023-01-22 NOTE — Progress Notes (Signed)
[name], [name] ([id number]) [id number] Page 1 of 11 Visit Report for 01/19/2023 Chief Complaint Document Details Patient Name: Date of Service: [name], [name] 01/19/2023 10:30 A M Medical Record Number: [id number] Patient Account Number: [id number] Date of Birth/Sex: Treating RN: 12/11/1950 (72 y.o. M) Primary [name] Provider: [name] [name] [name] Clinician: Referring Provider: Treating Provider/Extender: [name] Weeks in Treatment: 14 Information Obtained from: Patient Chief Complaint Patients presents for treatment of an open diabetic ulcer on the right great toe Electronic Signature(s) Signed: 01/19/2023 11:41:15 AM By: [name] Guess MD FACS Entered By: [name] on 01/19/2023 11:41:15 -------------------------------------------------------------------------------- Cellular or Tissue Based Product Details Patient Name: Date of Service: [name], [name] 01/19/2023 10:30 A M Medical Record Number: [id number] Patient Account Number: [id number] Date of Birth/Sex: Treating RN: Jan 18, 1951 (72 y.o. [name] Primary [name] Provider: [name] [name] [name] Clinician: Referring Provider: Treating Provider/Extender: [name] Weeks in Treatment: 14 Cellular or Tissue Based Product Type Wound #1 Right,Medial T Great oe Applied to: Performed By: Physician [name] Guess, MD Cellular or Tissue Based Product Type: Epicord Level of Consciousness (Pre-procedure): Awake and Alert Pre-procedure Verification/Time Out Yes - 11:10 Taken: Location: genitalia / hands / feet / multiple digits Wound Size (sq cm): 0.56 Product Size (sq cm): 6 Waste Size (sq cm): 4 Waste Reason: wound size Amount of Product Applied (sq cm): 2 Instrument Used: Forceps, Scissors Lot #: [id number] Order #: 2 Expiration Date: 08/03/2027 Fenestrated: No Reconstituted: Yes Solution Type: saline Solution Amount: 2 ml Lot #:  [id number] Solution Expiration Date: 03/01/2025 Secured: Yes Secured With: Steri-Strips Dressing Applied: Yes Primary Dressing: adaptic, gauze, kling Procedural Pain: 0 Post Procedural Pain: 0 Response to Treatment: Procedure was tolerated well Level of Consciousness (Post- Awake and Alert procedure): Post Procedure Diagnosis Same as Pre-procedure Electronic Signature(s) Signed: 01/19/2023 12:37:49 PM By: [name] Guess MD FACS [name], [name] ([id number]) [id number] Page 2 of 11 Signed: 01/19/2023 5:46:14 PM By: [name] Deed RN, BSN Entered By: [name] on 01/19/2023 11:16:23 -------------------------------------------------------------------------------- Debridement Details Patient Name: Date of Service: [name] [name], [name] 01/19/2023 10:30 A M Medical Record Number: [id number] Patient Account Number: [id number] Date of Birth/Sex: Treating RN: Oct 30, 1950 (72 y.o. [name] Primary [name] Provider: [name] [name] [name] Clinician: Referring Provider: Treating Provider/Extender: [name] Weeks in Treatment: 14 Debridement Performed for Assessment: Wound #1 Right,Medial T Great oe Performed By: Physician [name] Guess, MD Debridement Type: Debridement Severity of Tissue Pre Debridement: Necrosis of bone Level of Consciousness (Pre-procedure): Awake and Alert Pre-procedure Verification/Time Out Yes - 11:05 Taken: Start Time: 11:06 Pain Control: Lidocaine 4% T opical Solution T Area Debrided (L x W): otal 0.8 (cm) x 0.7 (cm) = 0.56 (cm) Tissue and [name] material debrided: Non-Viable, Slough, Slough Level: Non-Viable Tissue Debridement Description: Selective/Open Wound Instrument: Curette Bleeding: Minimum Hemostasis Achieved: Pressure Procedural Pain: 0 Post Procedural Pain: 0 Response to Treatment: Procedure was tolerated well Level of Consciousness (Post- Awake and Alert procedure): Post Debridement Measurements  of Total Wound Length: (cm) 0.8 Width: (cm) 0.7 Depth: (cm) 0.3 Volume: (cm) 0.132 Character of Wound/Ulcer Post Debridement: Improved Severity of Tissue Post Debridement: Necrosis of bone Post Procedure Diagnosis Same as Pre-procedure Notes scribed for Dr. Lady [name] by [name] Deed, RN Electronic Signature(s) Signed: 01/19/2023 12:37:49 PM By: [name] Guess MD FACS Signed: 01/19/2023 5:46:14 PM By: [name] Deed RN, BSN Entered By: [name] on 01/19/2023 11:12:57 -------------------------------------------------------------------------------- HPI Details Patient Name: Date of Service: [name], [name] 01/19/2023 10:30 A M  Medical Record Number: [id number] Patient Account Number: [id number] Date of Birth/Sex: Treating RN: 07/01/1951 (72 y.o. M) Primary [name] Provider: [name] [name] [name] Clinician: Referring Provider: Treating Provider/Extender: [name] Weeks in Treatment: 14 History of Present Illness HPI Description: ADMISSION 10/12/2022 This is a 72 year old type II diabetic (last hemoglobin A1c 7.7 in October 2023) with stage V chronic kidney disease and hypertension. He has been followed by podiatry for an ulcer on his right great toe. He has a history of prior toe and foot infections requiring amputation. In November 2023, he had an outpatient MRI that demonstrated osteomyelitis. He was admitted to the [name] for IV antibiotics and subsequently underwent irrigation and debridement of the toe with [name], [name] ([id number]) [phone].pdf Page 3 of 11 biopsy and placement of married 3 layer matrix. Cultures grew out MRSA and osteomyelitis was seen on pathology. He was followed by infectious disease and given a course of doxycycline and Augmentin. He completed this course at the end of December. He has been evaluated by vascular [name] with formal ABIs as well as consultation. He does have adequate blood flow at least to the  dorsalis pedis. He last saw podiatry on January 4 of this year where he underwent debridement. It was felt he would benefit from specialized wound [name] and he was referred to our [name] for further evaluation and management. On the dorsal aspect of his right great toe, there is a circular ulcer. There is a fair amount of undermining from callus and dry skin. The surface is a bit pale and fibrotic, but there is no obvious necrosis. 10/19/2022: He has been approved by his insurance to undergo hyperbaric oxygen therapy. EKG has been obtained and is normal. We are still waiting on his chest x-ray. The wound is clean with just a little bit of periwound callus and slough on the surface. Tendon is exposed. 10/26/2022: Unfortunately, the required co-pay from his insurance is cost prohibitive for him and he will not be able to receive hyperbaric oxygen therapy. Some moisture got under the dry skin around his wound and lifted it off but there is [name] epithelialized tissue beneath this. The exposed tendon has deteriorated. 11/03/2022: After some discussion with the financial office, it has become feasible for the patient to undergo hyperbaric oxygen therapy and he has received his first treatments this week. The wound is a little bit smaller and the tissue has a better color to it, but there is still extensive nonviable subcutaneous tissue and slough present. 11/09/2022: The wound measured a little bit smaller today. There is still slough and nonviable subcu tissue and tendon present. 11/16/2022: The wound continues to contract. There is still substantial depth to it, however, and bone remains exposed at the base. This is also a (early) 30-day update for HBO. 11/27/2022: Unfortunately, the wound has deteriorated. It appears that the tissue has become macerated. There is a second ulcer on the lateral aspect of his great toe. The nail is lifting off of the bed and there is wet skin lifting off of the underlying tissue.  The initial wound is smaller but still has a fair amount of depth to it. The patient does report quite a bit more drainage since his last visit. 12/20/2022: For scheduling reasons, somehow we have missed having a wound [name] visit in between his hyperbaric oxygen therapy treatments. The 2 openings of the wound have converged into 1 large site. There is necrotic tendon present at the lateral aspect of the great toe. The  bone is visible, including the joint space. The area of the initial wound is flush with the surrounding skin. No malodor or purulent drainage. 12/28/2022: The wound is vastly improved this week. The 2 areas of the wound have separated due to tissue bridging and healing the space between. There is no more necrotic tissue visible, although the bone/joint space remains exposed. 01/04/2023: His wound continues to improve. The more medial aspect of the toe is nearly closed. Bone remains exposed laterally. He has been approved for Epicord. He is going to start dialysis next week. 01/11/2023: The medial aspect of the wound is closed completely. The lateral open portion of his wound has contracted and is shallower. There is still bone exposure. He has started dialysis. We are going to apply Epicord today. 01/19/2023: There is [name] granulation tissue starting to fill in his wound. Bone remains exposed, however. Electronic Signature(s) Signed: 01/19/2023 11:42:21 AM By: [name] Guess MD FACS Entered By: [name] on 01/19/2023 11:42:21 -------------------------------------------------------------------------------- Physical Exam Details Patient Name: Date of Service: [name] [name], [name] 01/19/2023 10:30 A M Medical Record Number: [id number] Patient Account Number: [id number] Date of Birth/Sex: Treating RN: 10-Feb-1951 (72 y.o. M) Primary [name] Provider: [name] [name] [name] Clinician: Referring Provider: Treating Provider/Extender: [name] Weeks in Treatment:  14 Constitutional Hypertensive, asymptomatic. . . . no acute distress. Respiratory Normal work of breathing on room air. Notes 01/19/2023: There is [name] granulation tissue starting to fill in his wound. Bone remains exposed, however. Electronic Signature(s) Signed: 01/19/2023 12:15:25 PM By: [name] Guess MD FACS Previous Signature: 01/19/2023 11:44:35 AM Version By: [name] Guess MD FACS Entered By: [name] on 01/19/2023 12:15:25 -------------------------------------------------------------------------------- Physician Orders Details Patient Name: Date of Service: [place] [name], [name] 01/19/2023 10:30 A M Medical Record Number: [id number] Patient Account Number: [id number] Date of Birth/Sex: Treating RN: 1951-04-08 (72 y.o. 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 - Dr. Lady [name] Rm 1 Return A Friday 4/26 @ 10:30 am Anesthetic Wound #1 Right,Medial T Great oe (In clinic) Topical Lidocaine 4% applied to wound bed Cellular or Tissue Based Products Cellular or Tissue Based Product Type: - epicord #2 Cellular or Tissue Based Product applied to wound bed, secured with steri-strips, cover with Adaptic or Mepitel. (DO NOT REMOVE). Bathing/ Shower/ Hygiene May shower with protection but do not get wound dressing(s) wet. Protect dressing(s) with water  repellant cover (for example, large plastic bag) or a cast cover and may then take shower. Off-Loading Wound #1 Right,Medial T Great oe [name]: - Diabetic shoe Hyperbaric Oxygen Therapy Evaluate for HBO Therapy Indication: - chronic refractory osteomyelitis If appropriate for treatment, begin HBOT per protocol: 2.5 ATA for 90 Minutes with 2 Five (5) Minute A Breaks ir Total Number of Treatments: - 40 plus 40 (12/20/22) One treatments per day (delivered Monday through Friday unless otherwise specified in Special Instructions below): Finger stick Blood Glucose Pre- and Post- HBOT Treatment. Follow Hyperbaric Oxygen Glycemia Protocol Give two 4oz orange juices in addition to Glucerna when the glycemic protocol  is used. A frin (Oxymetazoline HCL) 0.05% nasal spray - 1 spray in both nostrils daily as needed prior to HBO treatment for difficulty clearing ears Wound Treatment Wound #1 - T Great oe Wound Laterality: Right, Medial Cleanser: Soap and Water 1 x Per Week/30 Days Discharge Instructions: May shower and wash wound with dial antibacterial soap and water prior to dressing change. Cleanser: Wound Cleanser 1 x Per Week/30 Days Discharge Instructions: Cleanse the wound with wound cleanser prior to applying a clean dressing using gauze sponges, not tissue or cotton balls. Prim Dressing: Epicord ary 1 x Per Week/30 Days Secondary Dressing: ADAPTIC TOUCH 3x4.25 in 1 x Per Week/30 Days Discharge Instructions: Apply over primary dressing as directed. Secondary Dressing: Woven Gauze Sponge, Non-Sterile 4x4 in 1 x Per Week/30 Days Discharge Instructions: Apply over primary dressing as directed. Secured With: Insurance underwriter, Sterile 2x75 (in/in) 1 x Per Week/30 Days Discharge Instructions: Secure with stretch gauze as directed. Secured With: 5M Medipore Scientist, research (life sciences) Surgical T 2x10 (in/yd) 1 x Per Week/30 Days ape Discharge Instructions: Secure with tape as directed. GLYCEMIA  INTERVENTIONS PROTOCOL PRE-HBO GLYCEMIA INTERVENTIONS ACTION INTERVENTION Obtain pre-HBO capillary blood glucose (ensure 1 physician order is in chart). NATANAEL, SALADIN ([id number]) [id number] Page 5 of 11 A. Notify HBO physician and await physician orders. 2 If result is 70 mg/dl or below: B. If the result meets the [name] definition of a critical result, follow [name] policy. A. Give patient an 8 ounce Glucerna Shake, an 8 ounce Ensure, or 8 ounces of a Glucerna/Ensure equivalent dietary supplement*. B. Wait 30 minutes. If result is 71 mg/dl to 409 mg/dl: C. Retest patients capillary blood glucose (CBG). D. If result greater than or equal to 110 mg/dl, proceed with HBO. If result less than 110 mg/dl, notify HBO physician and consider holding HBO. If result is 131 mg/dl to 811 mg/dl: A. Proceed with HBO. A. Notify HBO physician and await physician orders. B. It is recommended to hold HBO and do If result is 250 mg/dl or greater: blood/urine ketone testing. C. If the result meets the [name] definition of a critical result, follow [name] policy. POST-HBO GLYCEMIA INTERVENTIONS ACTION INTERVENTION Obtain post HBO capillary blood glucose (ensure 1 physician order is in chart). A. Notify HBO physician and await physician orders. 2 If result is 70 mg/dl or below: B. If the result meets the [name] definition of a critical result, follow [name] policy. A. Give patient an 8 ounce Glucerna Shake, an 8 ounce Ensure, or 8 ounces of a Glucerna/Ensure equivalent dietary supplement*. B. Wait 15 minutes for symptoms of If result is 71 mg/dl to 914 mg/dl: hypoglycemia (i.e. nervousness, anxiety, sweating, chills, clamminess, irritability, confusion, tachycardia or dizziness). C. If patient asymptomatic, discharge patient. If patient symptomatic, repeat capillary blood glucose (CBG) and notify HBO physician. If result is 101 mg/dl to  782 mg/dl: A. Discharge patient. A. Notify HBO physician and await physician orders. B. It is recommended to do blood/urine ketone If result is 250 mg/dl or greater: testing. C. If the result meets the [name] definition of a critical result, follow [name] policy. *Juice or candies are NOT equivalent products. If patient refuses the Glucerna or Ensure, please consult the [name] dietitian for an appropriate substitute. Electronic Signature(s) Signed: 01/19/2023 12:37:49 PM By: [name] Guess MD FACS Entered By: [name] on 01/19/2023 12:15:48 -------------------------------------------------------------------------------- Problem List Details Patient Name: Date of Service: [name], [name] 01/19/2023 10:30 A M Medical Record Number: [id number] Patient Account Number: [id number]  Date of Birth/Sex: Treating RN: 1951-01-12 (72 y.o. 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 10/12/2022 No Yes E11.621 Type 2 diabetes mellitus with foot ulcer 10/12/2022 No Yes N18.5 Chronic kidney disease, stage 5 10/12/2022 No Yes Ehrich, Hasson ([id number]) [id number] Page 6 of 11 M86.9 Osteomyelitis, unspecified 10/12/2022 No Yes E11.42 Type 2 diabetes mellitus with diabetic polyneuropathy 10/12/2022 No Yes I10 Essential (primary) hypertension 10/12/2022 No Yes Inactive Problems Resolved Problems Electronic Signature(s) Signed: 01/19/2023 12:14:47 PM By: [name] Guess MD FACS Previous Signature: 01/19/2023 11:36:03 AM Version By: [name] Guess MD FACS Entered By: [name] on 01/19/2023  12:14:47 -------------------------------------------------------------------------------- Progress Note Details Patient Name: Date of Service: [name], [name] 01/19/2023 10:30 A M Medical Record Number: [id number] Patient Account Number: [id number] Date of Birth/Sex: Treating RN: Jul 28, 1951 (72 y.o. M) Primary [name] Provider: [name] [name] [name] Clinician: Referring Provider: Treating Provider/Extender: [name] Weeks in Treatment: 14 Subjective Chief Complaint Information obtained from Patient Patients presents for treatment of an open diabetic ulcer on the right great toe History of Present Illness (HPI) ADMISSION 10/12/2022 This is a 72 year old type II diabetic (last hemoglobin A1c 7.7 in October 2023) with stage V chronic kidney disease and hypertension. He has been followed by podiatry for an ulcer on his right great toe. He has a history of prior toe and foot infections requiring amputation. In November 2023, he had an outpatient MRI that demonstrated osteomyelitis. He was admitted to the [name] for IV antibiotics and subsequently underwent irrigation and debridement of the toe with biopsy and placement of married 3 layer matrix. Cultures grew out MRSA and osteomyelitis was seen on pathology. He was followed by infectious disease and given a course of doxycycline and Augmentin. He completed this course at the end of December. He has been evaluated by vascular [name] with formal ABIs as well as consultation. He does have adequate blood flow at least to the dorsalis pedis. He last saw podiatry on January 4 of this year where he underwent debridement. It was felt he would benefit from specialized wound [name] and he was referred to our [name] for further evaluation and management. On the dorsal aspect of his right great toe, there is a circular ulcer. There is a fair amount of undermining from callus and dry skin. The surface is a bit pale and fibrotic, but there is no  obvious necrosis. 10/19/2022: He has been approved by his insurance to undergo hyperbaric oxygen therapy. EKG has been obtained and is normal. We are still waiting on his chest x-ray. The wound is clean with just a little bit of periwound callus and slough on the surface. Tendon is exposed. 10/26/2022: Unfortunately, the required co-pay from his insurance is cost prohibitive for him and he will not be able to receive hyperbaric oxygen therapy. Some moisture got under the dry skin around his wound and lifted it off but there is [name] epithelialized tissue beneath this. The exposed tendon has deteriorated. 11/03/2022: After some discussion with the financial office, it has become feasible for the patient to undergo hyperbaric oxygen therapy and he has received his first treatments this week. The wound is a little bit smaller and the tissue has a better color to it, but there is still extensive nonviable subcutaneous tissue and slough  present. 11/09/2022: The wound measured a little bit smaller today. There is still slough and nonviable subcu tissue and tendon present. 11/16/2022: The wound continues to contract. There is still substantial depth to it, however, and bone remains exposed at the base. This is also a (early) 30-day update for HBO. 11/27/2022: Unfortunately, the wound has deteriorated. It appears that the tissue has become macerated. There is a second ulcer on the lateral aspect of his great toe. The nail is lifting off of the bed and there is wet skin lifting off of the underlying tissue. The initial wound is smaller but still has a fair amount of depth to it. The patient does report quite a bit more drainage since his last visit. 12/20/2022: For scheduling reasons, somehow we have missed having a wound [name] visit in between his hyperbaric oxygen therapy treatments. The 2 openings of the wound have converged into 1 large site. There is necrotic tendon present at the lateral aspect of the great toe.  The bone is visible, including the joint space. The area of the initial wound is flush with the surrounding skin. No malodor or purulent drainage. 12/28/2022: The wound is vastly improved this week. The 2 areas of the wound have separated due to tissue bridging and healing the space between. There is no more necrotic tissue visible, although the bone/joint space remains exposed. JAQUEL, GLASSBURN ([id number]) [id number] Page 7 of 11 01/04/2023: His wound continues to improve. The more medial aspect of the toe is nearly closed. Bone remains exposed laterally. He has been approved for Epicord. He is going to start dialysis next week. 01/11/2023: The medial aspect of the wound is closed completely. The lateral open portion of his wound has contracted and is shallower. There is still bone exposure. He has started dialysis. We are going to apply Epicord today. 01/19/2023: There is [name] granulation tissue starting to fill in his wound. Bone remains exposed, however. Patient History Information obtained from Patient. Family History Diabetes - Mother,Siblings, Heart Disease - Siblings, Hypertension - Siblings, Seizures - Father, Stroke - Siblings, No family history of Cancer, Hereditary Spherocytosis, Kidney Disease, Lung Disease, Thyroid Problems, Tuberculosis. Social History Former smoker, Marital Status - Married, Alcohol Use - Rarely, Drug Use - No History, Caffeine Use - Daily. Medical History Eyes Patient has history of Cataracts - rt eye Ear/Nose/Mouth/Throat Denies history of Chronic sinus problems/congestion Hematologic/Lymphatic Denies history of Anemia, Hemophilia, Human Immunodeficiency Virus, Lymphedema, Sickle Cell Disease Respiratory Denies history of Aspiration, Asthma, Chronic Obstructive Pulmonary Disease (COPD), Pneumothorax, Tuberculosis Cardiovascular Patient has history of Hypertension Gastrointestinal Denies history of Cirrhosis , Colitis, Crohnoos,  Hepatitis A, Hepatitis B, Hepatitis C Endocrine Patient has history of Type II Diabetes Genitourinary Denies history of End Stage Renal Disease Immunological Denies history of Lupus Erythematosus, Raynaudoos, Scleroderma Integumentary (Skin) Denies history of History of Burn Musculoskeletal Patient has history of Osteomyelitis - MRI confirmed (08/16/22) Denies history of Gout Neurologic Patient has history of Neuropathy Psychiatric Patient has history of Confinement Anxiety Denies history of Anorexia/bulimia Hospitalization/[name] History - AV fistula place left upper arm 10/20/22. Medical A Surgical History Notes nd Genitourinary CKD IV Objective Constitutional Hypertensive, asymptomatic. no acute distress. Vitals Time Taken: 10:52 AM, Height: 72 in, Weight: 185 lbs, BMI: 25.1, Temperature: 98.1 F, Pulse: 96 bpm, Respiratory Rate: 18 breaths/min, Blood Pressure: 170/70 mmHg. Respiratory Normal work of breathing on room air. General Notes: 01/19/2023: There is [name] granulation tissue starting to fill in his wound. Bone remains exposed, however. Integumentary (Hair, Skin) Wound #  1 status is Open. Original cause of wound was Pressure Injury. The date acquired was: 08/16/2022. The wound has been in treatment 14 weeks. The wound is located on the Right,Medial T Great. The wound measures 0.8cm length x 0.7cm width x 0.3cm depth; 0.44cm^2 area and 0.132cm^3 volume. There oe is bone, joint, tendon, and Fat Layer (Subcutaneous Tissue) exposed. There is no tunneling or undermining noted. There is a medium amount of serosanguineous drainage noted. The wound margin is distinct with the outline attached to the wound base. There is large (67-100%) red granulation within the wound bed. There is a small (1-33%) amount of necrotic tissue within the wound bed including Adherent Slough. The periwound skin appearance had no abnormalities noted for texture. The periwound skin appearance had no  abnormalities noted for moisture. The periwound skin appearance had no abnormalities noted for color. Periwound temperature was noted as No Abnormality. 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 There was a Selective/Open Wound Non-Viable Tissue Debridement with a total area of 0.56 sq cm performed by [name] Guess, MD. With the following instrument(s): Curette to remove Non-Viable tissue/material. Material removed includes [place] [name] after achieving pain control using Lidocaine 4% T opical Solution. No specimens were taken. A time out was conducted at 11:05, prior to the start of the procedure. A Minimum amount of bleeding was controlled with Pressure. The procedure was tolerated well with a pain level of 0 throughout and a pain level of 0 following the procedure. Post Debridement Measurements: 0.8cm length x 0.7cm width x 0.3cm depth; 0.132cm^3 volume. Character of Wound/Ulcer Post Debridement is improved. Severity of Tissue Post Debridement is: Necrosis of bone. Post procedure Diagnosis Wound #1: Same as Pre-Procedure General Notes: scribed for Dr. Lady [name] by [name] Deed, RN. Pre-procedure diagnosis of Wound #1 is a Diabetic Wound/Ulcer of the Lower Extremity located on the Right,Medial T Great. A skin graft procedure using a oe bioengineered skin substitute/cellular or tissue based product was performed by [name] Guess, MD with the following  instrument(s): Forceps and Scissors. Epicord was applied and secured with Steri-Strips. 2 sq cm of product was utilized and 4 sq cm was wasted due to wound size. Post Application, adaptic, gauze, kling was applied. A Time Out was conducted at 11:10, prior to the start of the procedure. The procedure was tolerated well with a pain level of 0 throughout and a pain level of 0 following the procedure. Post procedure Diagnosis Wound #1: Same as Pre-Procedure . Plan Follow-up Appointments: Return Appointment in 1 week. - Dr. Lady [name] Rm 1 Friday 4/26 @ 10:30 am Anesthetic: Wound #1 Right,Medial T Great: oe (In clinic) Topical Lidocaine 4% applied to wound bed Cellular or Tissue Based Products: Cellular or Tissue Based Product Type: - epicord #2 Cellular or Tissue Based Product applied to wound bed, secured with steri-strips, cover with Adaptic or Mepitel. (DO NOT REMOVE). Bathing/ Shower/ Hygiene: May shower with protection but do not get wound dressing(s) wet. Protect dressing(s) with water repellant cover (for example, large plastic bag) or a cast cover and may then take shower. Off-Loading: Wound #  1 Right,Medial T Great: oe [name]: - Diabetic shoe Hyperbaric Oxygen Therapy: Evaluate for HBO Therapy Indication: - chronic refractory osteomyelitis If appropriate for treatment, begin HBOT per protocol: 2.5 ATA for 90 Minutes with 2 Five (5) Minute Air Breaks T Number of Treatments: - 40 plus 40 (12/20/22) otal One treatments per day (delivered Monday through Friday unless otherwise specified in Special Instructions below): Finger stick Blood Glucose Pre- and Post- HBOT Treatment. Follow Hyperbaric Oxygen Glycemia Protocol Give two 4oz orange juices in addition to Glucerna when the glycemic protocol is used. Afrin (Oxymetazoline HCL) 0.05% nasal spray - 1 spray in both nostrils daily as needed prior to HBO treatment for difficulty clearing ears WOUND #1: - T Great Wound Laterality: Right,  Medial oe Cleanser: Soap and Water 1 x Per Week/30 Days Discharge Instructions: May shower and wash wound with dial antibacterial soap and water prior to dressing change. Cleanser: Wound Cleanser 1 x Per Week/30 Days Discharge Instructions: Cleanse the wound with wound cleanser prior to applying a clean dressing using gauze sponges, not tissue or cotton balls. Prim Dressing: Epicord 1 x Per Week/30 Days ary Secondary Dressing: ADAPTIC TOUCH 3x4.25 in 1 x Per Week/30 Days Discharge Instructions: Apply over primary dressing as directed. Secondary Dressing: Woven Gauze Sponge, Non-Sterile 4x4 in 1 x Per Week/30 Days Discharge Instructions: Apply over primary dressing as directed. Secured With: Insurance underwriter, Sterile 2x75 (in/in) 1 x Per Week/30 Days Discharge Instructions: Secure with stretch gauze as directed. Secured With: 18M Medipore Scientist, research (life sciences) Surgical T 2x10 (in/yd) 1 x Per Week/30 Days ape Discharge Instructions: Secure with tape as directed. [name], [name] ([id number]) [id number] Page 9 of 11 01/19/2023: There is [name] granulation tissue starting to fill in his wound. Bone remains exposed, however. I used a curette to debride a small amount of slough from the wound. I then prepared the Epicord, hydrating it with normal saline. I placed it on the wound, packing it into the crevice at the proximal portion of the site. It was secured in place with Adaptic and Steri-Strips. Approximately one third of the product was used. He will continue his hyperbaric oxygen therapy. Follow-up for wound [name] in 1 week. Electronic Signature(s) Signed: 01/19/2023 5:46:14 PM By: [name] Deed RN, BSN Signed: 01/22/2023 8:37:44 AM By: [name] Guess MD FACS Previous Signature: 01/19/2023 12:17:02 PM Version By: [name] Guess MD FACS Entered By: [name] on 01/19/2023  16:34:44 -------------------------------------------------------------------------------- HxROS Details Patient Name: Date of Service: [name] [name], [name] 01/19/2023 10:30 A M Medical Record Number: [id number] Patient Account Number: [id number] Date of Birth/Sex: Treating RN: 12-04-1950 (72 y.o. M) Primary [name] Provider: [name] [name] [name] Clinician: Referring Provider: Treating Provider/Extender: [name] Weeks in Treatment: 14 Information Obtained From Patient Eyes Medical History: Positive for: Cataracts - rt eye Ear/Nose/Mouth/Throat Medical History: Negative for: Chronic sinus problems/congestion Hematologic/Lymphatic Medical History: Negative for: Anemia; Hemophilia; Human Immunodeficiency Virus; Lymphedema; Sickle Cell Disease Respiratory Medical History: Negative for: Aspiration; Asthma; Chronic Obstructive Pulmonary Disease (COPD); Pneumothorax; Tuberculosis Cardiovascular Medical History: Positive for: Hypertension Gastrointestinal Medical History: Negative for: Cirrhosis ; Colitis; Crohns; Hepatitis A; Hepatitis B; Hepatitis C Endocrine Medical History: Positive for: Type II Diabetes Time with diabetes: 2000 Treated with: Insulin Blood sugar tested every day: Yes Tested : Genitourinary Medical History: Negative for: End Stage Renal Disease Past Medical History Notes: CKD IV Immunological Medical HistoryEFRAIM, VANALLEN ([id number]) [id number] Page 10 of 11 Negative for: Lupus Erythematosus; Raynauds; Scleroderma Integumentary (Skin) Medical History: Negative for: History of  Burn Musculoskeletal Medical History: Positive for: Osteomyelitis - MRI confirmed (08/16/22) Negative for: Gout Neurologic Medical History: Positive for: Neuropathy Psychiatric Medical History: Positive for: Confinement Anxiety Negative for: Anorexia/bulimia HBO Extended History Items Eyes: Cataracts Immunizations Pneumococcal  Vaccine: Received Pneumococcal Vaccination: Yes Received Pneumococcal Vaccination On or After 60th Birthday: Yes Implantable Devices None Hospitalization / [name] History Type of Hospitalization/[name] AV fistula place left upper arm 10/20/22 Family and Social History Cancer: No; Diabetes: Yes - Mother,Siblings; Heart Disease: Yes - Siblings; Hereditary Spherocytosis: No; Hypertension: Yes - Siblings; Kidney Disease: No; Lung Disease: No; Seizures: Yes - Father; Stroke: Yes - Siblings; Thyroid Problems: No; Tuberculosis: No; Former smoker; Marital Status - Married; Alcohol Use: Rarely; Drug Use: No History; Caffeine Use: Daily; Financial Concerns: No; Food, Clothing or Shelter Needs: No; Support System Lacking: No; Transportation Concerns: No Electronic Signature(s) Signed: 01/19/2023 12:37:49 PM By: [name] Guess MD FACS Entered By: [name] on 01/19/2023 11:43:26 -------------------------------------------------------------------------------- SuperBill Details Patient Name: Date of Service: [name], [name] 01/19/2023 Medical Record Number: [id number] Patient Account Number: [id number] Date of Birth/Sex: Treating RN: 14-Feb-1951 (72 y.o. 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 E11.621 Type 2 diabetes mellitus with foot ulcer N18.5 Chronic kidney disease, stage 5 M86.9 Osteomyelitis, unspecified E11.42 Type 2 diabetes mellitus with diabetic polyneuropathy Ibsen, Andersen ([id number]) [id number] Page 11 of 11 I10 Essential (primary) hypertension Facility Procedures : CPT4 Code: 25366440 Description: Q4187 Epicord 2cm x 3cm - per sqcm Modifier: Quantity: 6 : CPT4 Code:  [id number] Description: 15275 - SKIN SUB GRAFT FACE/NK/HF/G ICD-10 Diagnosis Description L97.514 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 Modifier: Quantity: 1 Physician Procedures : CPT4 Code Description Modifier [id number] 99214 - WC PHYS LEVEL 4 - EST PT 25 ICD-10 Diagnosis Description L97.514 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

## 2023-01-22 NOTE — Progress Notes (Addendum)
[place], [name] ([id number]) [id number] Page 1 of 2 Visit Report for 01/22/2023 Arrival Information Details Patient Name: Date of Service: [name], [name] [name] 01/22/2023 12:00 PM Medical Record Number: [id number] Patient Account Number: [id number] Date of Birth/Sex: Treating RN: 07-03-1951 (72 y.o. [name], [name] Primary Care [name]: [name] Other Other Clinician: Haywood [name] Referring [name]: Treating [name]/Extender: [name] Weeks in Treatment: 14 Visit Information History Since Last Visit All ordered tests and consults were completed: Yes Patient Arrived: Cane Added or deleted any medications: No Arrival Time: 10:50 Any new allergies or adverse reactions: No Accompanied By: self Had a fall or experienced change in No Transfer Assistance: None activities of daily living that may affect Patient Identification Verified: Yes risk of falls: Secondary Verification Process Completed: Yes Signs or symptoms of abuse/neglect since last visito No Patient Requires Transmission-Based Precautions: No Hospitalized since last visit: No Patient Has Alerts: No Implantable device outside of the clinic excluding No cellular tissue based products placed in the center since last visit: Pain Present Now: No Electronic Signature(s) Signed: 01/22/2023 12:38:20 PM By: [name] CHT EMT BS , , Entered By: [name] on 01/22/2023 12:38:20 -------------------------------------------------------------------------------- Encounter Discharge Information Details Patient Name: Date of Service: [facility], [name] 01/22/2023 12:00 PM Medical Record Number: [id number] Patient Account Number: [id number] Date of Birth/Sex: Treating RN: Feb 02, 1951 (72 y.o. [name] Primary Care Arth Nicastro: [name] Other Other Clinician: Haywood [name] Referring Aquarius Latouche: Treating Landon Bassford/Extender: [name] Weeks in Treatment:  14 Encounter Discharge Information Items Discharge Condition: Stable Ambulatory Status: Ambulatory Discharge Destination: Home Transportation: Private Auto Accompanied By: self Schedule Follow-up Appointment: No Clinical Summary of Care: Electronic Signature(s) Signed: 01/22/2023 4:03:18 PM By: [name] CHT EMT BS , , Entered By: [name] on 01/22/2023 16:03:18 -------------------------------------------------------------------------------- Vitals Details Patient Name: Date of Service: [name], [name] 01/22/2023 12:00 PM Medical Record Number: [id number] Patient Account Number: [id number] Date of Birth/Sex: Treating RN: 07-21-1951 (72 y.o. [name] Primary Care Kensi Karr: [name] Other Other Clinician: Haywood [name] Referring Kayvon Mo: Treating Jesselee Poth/Extender: [name] Weeks in Treatment: 14 Vital Signs Time Taken: 11:15 Temperature (F): 98.8 [place], Durand ([id number]) [phone].pdf Page 2 of 2 Height (in): 72 Pulse (bpm): 97 Weight (lbs): 185 Respiratory Rate (breaths/min): 18 Body Mass Index (BMI): 25.1 Blood Pressure (mmHg): 157/72 Capillary Blood Glucose (mg/dl): 433 Reference Range: 80 - 120 mg / dl Electronic Signature(s) Signed: 01/22/2023 3:56:12 PM By: [name] CHT EMT BS , , Entered By: [name] on 01/22/2023 15:56:12

## 2023-01-22 NOTE — Progress Notes (Addendum)
Heber, Majd ([id number]) [id number] Page 1 of 2 Visit Report for 01/22/2023 HBO Details Patient Name: Date of Service: [name], [name] 01/22/2023 12:00 PM Medical Record Number: [id number] Patient Account Number: [id number] Date of Birth/Sex: Treating RN: [birth year] (72 y.o. 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 HBO Treatment Details Treatment Number: 44 Patient Type: Outpatient Chamber Type: Monoplace Chamber Serial #: [id number] Treatment Protocol: 2.5 ATA with 90 minutes oxygen, with two 5 minute air breaks Treatment Details Compression Rate Down: 1.5 psi / minute De-Compression Rate Up: 2.0 psi / minute A breaks and breathing ir Compress Tx Pressure periods Decompress Decompress Begins Reached (leave unused spaces Begins Ends blank) Chamber Pressure (ATA 1 2.5 2.5 2.5 2.5 2.5 - - 2.5 1 ) Clock Time (24 hr) 11:21 11:35 12:05 12:10 12:40 12:45 - - 13:15 13:25 Treatment Length: 124 (minutes) Treatment Segments: 4 Vital Signs Capillary Blood Glucose Reference Range: 80 - 120 mg / dl HBO Diabetic Blood Glucose Intervention Range: <131 mg/dl or >782 mg/dl Type: Time Vitals Blood Respiratory Capillary Blood Glucose Pulse Action Pulse: Temperature: Taken: Pressure: Rate: Glucose (mg/dl): Meter #: Oximetry (%) Taken: Pre 11:15 157/72 97 18 98.8 178 1 100 none per protocol Post 13:30 157/62 90 18 97.4 182 1 none per protocol Treatment Response Treatment Toleration: Well Treatment Completion Status: Treatment Completed without Adverse Event Treatment Notes Mr. Chio arrived  with a blood glucose level of 178 mg/dL. He prepared for treatment. After performing a safety check, patient was placed in the chamber which was compressed at a rate of 2 psi/min. He tolerated the treatment and the subsequent decompression of the chamber at a rate of 2 psi/min. He denied any issues with ear equalization and/or pain. His post-treatment vital signs were within normal range. He was stable upon discharge. Physician HBO Attestation: I certify that I supervised this HBO treatment in accordance with Medicare guidelines. A trained emergency response team is readily available per Yes hospital policies and procedures. Continue HBOT as ordered. Yes Electronic Signature(s) Signed: 01/22/2023 4:03:58 PM By: [name] MD FACS Previous Signature: 01/22/2023 4:01:22 PM Version By: [name] CHT EMT BS , , Previous Signature: 01/22/2023 12:56:11 PM Version By: [name] CHT EMT BS , , Entered By: [name] on 01/22/2023 16:03:58 -------------------------------------------------------------------------------- HBO Safety Checklist Details Patient Name: Date of Service: [facility], [name] [name] 01/22/2023 12:00 PM Medical Record Number: [id number] Patient Account Number: [id number] Date of Birth/Sex: Treating RN: [birth year] (72 y.o. [name] Primary Care Breeze Angell: [name] Other Other Clinician: [name] [place], [name] ([id number]) [id number] Page 2 of 2 Referring Laycie Schriner: Treating Henchy Mccauley/Extender: [name] Weeks in Treatment: 14 HBO Safety Checklist Items Safety Checklist Consent Form Signed Patient voided / foley secured and emptied When did you last eato 1030 Last dose of injectable or oral agent Last PM Ostomy pouch emptied and vented if applicable NA All implantable devices assessed, documented and approved Dialysis Shunt Intravenous access site secured and place NA Valuables secured Linens and  cotton and cotton/polyester blend (less than 51% polyester) Personal oil-based products / skin lotions / body lotions removed Wigs or hairpieces removed NA Smoking or tobacco materials removed NA Books / newspapers / magazines / loose paper  removed Cologne, aftershave, perfume and deodorant removed Jewelry removed (may wrap wedding band) Make-up removed NA Hair care products removed Battery operated devices (external) removed Heating patches and chemical warmers removed Titanium eyewear removed Nail polish cured greater than 10 hours NA Casting material cured greater than 10 hours NA Hearing aids removed NA Loose dentures or partials removed left at home Prosthetics have been removed NA Patient demonstrates correct use of air break device (if applicable) Patient concerns have been addressed Patient grounding bracelet on and cord attached to chamber Specifics for Inpatients (complete in addition to above) Medication sheet sent with patient NA Intravenous medications needed or due during therapy sent with patient NA Drainage tubes (e.g. nasogastric tube or chest tube secured and vented) NA Endotracheal or Tracheotomy tube secured NA Cuff deflated of air and inflated with saline NA Airway suctioned NA Notes Paper version used prior to treatment start. Electronic Signature(s) Signed: 01/25/2023 3:37:11 PM By: [name] CHT EMT BS , , Previous Signature: 01/22/2023 12:41:59 PM Version By: [name] CHT EMT BS , , Entered By: [name] on 01/25/2023 15:37:11

## 2023-01-22 NOTE — Progress Notes (Signed)
CHIP, CANEPA ([id number]) [id number] Page 1 of 1 Visit Report for 01/22/2023 SuperBill Details Patient Name: Date of Service: [name], [name] 01/22/2023 Medical Record Number: [id number] Patient Account Number: [id number] Date of Birth/Sex: Treating RN: 08/11/1951 (72 y.o. 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.621 Type 2 diabetes mellitus with foot ulcer N18.5 Chronic kidney disease, stage 5 M86.9 Osteomyelitis, unspecified E11.42 Type 2 diabetes mellitus with diabetic polyneuropathy I10 Essential (primary) hypertension Facility Procedures CPT4 Code Description Modifier Quantity [id number] G0277-(Facility Use Only) HBOT full body chamber, , 4 ICD-10 Diagnosis Description M86.9 Osteomyelitis, unspecified 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.621 Type 2 diabetes mellitus with foot ulcer Physician Procedures Quantity CPT4 Code Description Modifier [id number] 99183 - WC PHYS HYPERBARIC OXYGEN THERAPY 1 ICD-10 Diagnosis Description M86.9 Osteomyelitis, unspecified 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

## 2023-01-23 ENCOUNTER — Encounter (HOSPITAL_BASED_OUTPATIENT_CLINIC_OR_DEPARTMENT_OTHER): Payer: No Typology Code available for payment source | Admitting: General Surgery

## 2023-01-23 DIAGNOSIS — E11621 Type 2 diabetes mellitus with foot ulcer: Secondary | ICD-10-CM | POA: Diagnosis not present

## 2023-01-23 DIAGNOSIS — M86671 Other chronic osteomyelitis, right ankle and foot: Secondary | ICD-10-CM | POA: Diagnosis not present

## 2023-01-23 LAB — GLUCOSE, CAPILLARY
Glucose-Capillary: 193 mg/dL — ABNORMAL HIGH (ref 70–99)
Glucose-Capillary: 189 mg/dL — ABNORMAL HIGH (ref 70–99)

## 2023-01-23 NOTE — Progress Notes (Signed)
[name], Christen [name] ([id number]) [id number] Page 1 of 2 Visit Report for 01/23/2023 Arrival Information Details Patient Name: Date of Service: [name], [name] 01/23/2023 12:00 PM Medical Record Number: [id number] Patient Account Number: [id number] Date of Birth/Sex: Treating RN: [birth year] (72 y.o. [name], [name] Primary Care Ambreen Tufte: [name] Other Other Clinician: [name] Referring Maurya Nethery: Treating Erynne Kealey/Extender: [name] Weeks in Treatment: 14 Visit Information History Since Last Visit All ordered tests and consults were completed: Yes Patient Arrived: Cane Added or deleted any medications: No Arrival Time: 11:14 Any new allergies or adverse reactions: No Accompanied By: None Had a fall or experienced change in No Transfer Assistance: None activities of daily living that may affect Patient Identification Verified: Yes risk of falls: Secondary Verification Process Completed: Yes Signs or symptoms of abuse/neglect since last visito No Patient Requires Transmission-Based Precautions: No Hospitalized since last visit: No Patient Has Alerts: No Implantable device outside of the clinic excluding No cellular tissue based products placed in the center since last visit: Pain Present Now: No Electronic Signature(s) Signed: 01/23/2023 11:56:32 AM By: [name] EMT Entered By: [name] on 01/23/2023 11:56:32 -------------------------------------------------------------------------------- Encounter Discharge Information Details Patient Name: Date of Service: [facility] [name], [name] 01/23/2023 12:00 PM Medical Record Number: [id number] Patient Account Number: [id number] Date of Birth/Sex: Treating RN: [birth year] (72 y.o. [name] Primary Care Nelle Sayed: [name] Other Other Clinician: [name] Referring Tramon Crescenzo: Treating Demri Poulton/Extender: [name] Weeks in Treatment: 14 Encounter Discharge  Information Items Discharge Condition: Stable Ambulatory Status: Cane Discharge Destination: Home Transportation: Private Auto Accompanied By: None Schedule Follow-up Appointment: Yes Clinical Summary of Care: Electronic Signature(s) Signed: 01/23/2023 1:59:11 PM By: [name] EMT Entered By: [name] on 01/23/2023 13:59:11 -------------------------------------------------------------------------------- Vitals Details Patient Name: Date of Service: [facility] [name], [name] 01/23/2023 12:00 PM Medical Record Number: [id number] Patient Account Number: [id number] Date of Birth/Sex: Treating RN: May 15, 1951 (72 y.o. [name] Primary Care Vidur Knust: [name] Other Other Clinician: [name] Referring Lamaj Metoyer: Treating [name]/Extender: [name] Weeks in Treatment: 14 Vital Signs Time Taken: 11:19 Capillary Blood Glucose (mg/dl): 284 [name], [name] ([id number]) [id number] Page 2 of 2 Height (in): 72 Reference Range: 80 - 120 mg / dl Weight (lbs): 884 Body Mass Index (BMI): 25.1 Electronic Signature(s) Signed: 01/23/2023 11:56:57 AM By: [name] EMT Entered By: [name] on 01/23/2023 11:56:57

## 2023-01-24 ENCOUNTER — Encounter (HOSPITAL_BASED_OUTPATIENT_CLINIC_OR_DEPARTMENT_OTHER): Payer: No Typology Code available for payment source | Admitting: General Surgery

## 2023-01-24 DIAGNOSIS — E11621 Type 2 diabetes mellitus with foot ulcer: Secondary | ICD-10-CM | POA: Diagnosis not present

## 2023-01-24 DIAGNOSIS — M86671 Other chronic osteomyelitis, right ankle and foot: Secondary | ICD-10-CM | POA: Diagnosis not present

## 2023-01-24 LAB — GLUCOSE, CAPILLARY
Glucose-Capillary: 140 mg/dL — ABNORMAL HIGH (ref 70–99)
Glucose-Capillary: 169 mg/dL — ABNORMAL HIGH (ref 70–99)

## 2023-01-24 NOTE — Progress Notes (Signed)
[place], Akiva ([id number]) [id number] Page 1 of 2 Visit Report for 01/23/2023 HBO Details Patient Name: Date of Service: [name], [name] 01/23/2023 12:00 PM Medical Record Number: [id number] Patient Account Number: [id number] Date of Birth/Sex: Treating RN: 1951/04/16 (72 y.o. 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 HBO Treatment Details Treatment Number: 45 Patient Type: Outpatient Chamber Type: Monoplace Chamber Serial #: [id number] Treatment Protocol: 2.5 ATA with 90 minutes oxygen, with two 5 minute air breaks Treatment Details Compression Rate Down: 2.0 psi / minute De-Compression Rate Up: A breaks and breathing ir Compress Tx Pressure periods Decompress Decompress Begins Reached (leave unused spaces Begins Ends blank) Chamber Pressure (ATA 1 2.5 2.5 2.5 2.5 2.5 - - 2.5 1 ) Clock Time (24 hr) 11:37 11:52 12:22 12:27 12:57 13:02 - - 13:32 13:43 Treatment Length: 126 (minutes) Treatment Segments: 4 Vital Signs Capillary Blood Glucose Reference Range: 80 - 120 mg / dl HBO Diabetic Blood Glucose Intervention Range: <131 mg/dl or >130 mg/dl Time Vitals Blood Respiratory Capillary Blood Glucose Pulse Action Type: Pulse: Temperature: Taken: Pressure: Rate: Glucose (mg/dl): Meter #: Oximetry (%) Taken: Pre 11:19 193 Post 13:49 155/60 88 18 97.4 189 Pre 11:31 161/70 100 16 98.4 Treatment Response Treatment Toleration: Well Treatment Completion Status: Treatment Completed without Adverse Event Physician HBO Attestation: I certify that I supervised this HBO treatment in  accordance with Medicare guidelines. A trained emergency response team is readily available per Yes hospital policies and procedures. Continue HBOT as ordered. Yes Electronic Signature(s) Signed: 01/23/2023 2:25:34 PM By: [name] MD FACS Previous Signature: 01/23/2023 1:57:51 PM Version By: [name] EMT Previous Signature: 01/23/2023 11:58:50 AM Version By: [name] EMT Entered By: [name] on 01/23/2023 14:25:34 -------------------------------------------------------------------------------- HBO Safety Checklist Details Patient Name: Date of Service: [facility], [name] 01/23/2023 12:00 PM Medical Record Number: [id number] Patient Account Number: [id number] Date of Birth/Sex: Treating RN: 06/14/1951 (72 y.o. [name] Primary Care Mc Hollen: [name] Other Other Clinician: [name] Referring [name]: Treating Rowen Hur/Extender: [name] Weeks in Treatment: [address] [name] ([id number]) [id number] Page 2 of 2 HBO Safety Checklist Items Safety Checklist Consent Form Signed Patient voided / foley secured and emptied When did you last eato 1045 Last dose of injectable or oral agent Yesterday Ostomy pouch emptied and vented if applicable NA All implantable devices assessed, documented and approved Dialysis shunt Intravenous access site secured and place NA Valuables secured Linens and cotton and cotton/polyester blend (less than 51% polyester) Personal oil-based products / skin lotions / body lotions removed Wigs or hairpieces removed NA Smoking or tobacco materials removed Books / newspapers / magazines / loose paper removed Cologne, aftershave, perfume and deodorant removed Jewelry removed (may wrap wedding band) Make-up removed NA Hair care products removed Battery operated devices (external) removed Heating patches and chemical warmers removed Titanium eyewear removed NA Nail polish cured  greater than 10 hours NA Casting material cured greater than 10 hours NA Hearing aids removed NA Loose dentures or partials removed removed by patient Prosthetics have been removed NA Patient demonstrates correct use of air break device (if applicable) Patient concerns have been addressed Patient grounding bracelet on and cord attached to chamber Specifics for Inpatients (complete in  addition to above) Medication sheet sent with patient NA Intravenous medications needed or due during therapy sent with patient NA Drainage tubes (e.g. nasogastric tube or chest tube secured and vented) NA Endotracheal or Tracheotomy tube secured NA Cuff deflated of air and inflated with saline NA Airway suctioned NA Notes The safety checklist was done before the treatment was started. Electronic Signature(s) Signed: 01/23/2023 11:58:15 AM By: [name] EMT Entered By: [name] on 01/23/2023 11:58:15

## 2023-01-24 NOTE — Progress Notes (Signed)
[name], [name] ([id number]) [id number] Page 1 of 2 Visit Report for 01/23/2023 Problem List Details Patient Name: Date of Service: [name], [name] 01/23/2023 12:00 PM Medical Record Number: [id number] Patient Account Number: [id number] Date of Birth/Sex: Treating RN: [birth year] (72 y.o. 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.621 Type 2 diabetes mellitus with foot ulcer 10/12/2022 No Yes N18.5 Chronic kidney disease, stage 5 10/12/2022 No Yes M86.9 Osteomyelitis, unspecified 10/12/2022 No Yes E11.42 Type 2 diabetes mellitus with diabetic polyneuropathy 10/12/2022 No Yes I10 Essential (primary) hypertension 10/12/2022 No Yes Inactive Problems Resolved Problems Electronic Signature(s) Signed: 01/23/2023 1:58:41 PM By: [name] EMT Signed: 01/23/2023 2:24:35 PM By: [name] MD FACS Entered By: [name] on 01/23/2023 13:58:40 -------------------------------------------------------------------------------- SuperBill Details Patient Name: Date of Service: [name], [name] 01/23/2023 Medical Record Number: [id number] Patient Account Number: [id number] Date of Birth/Sex: Treating RN: Sep 14, 1951 (72 y.o. 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.621 Type 2 diabetes mellitus with foot ulcer [name], [name] ([id number])  [phone].pdf Page 2 of 2 N18.5 Chronic kidney disease, stage 5 M86.9 Osteomyelitis, unspecified E11.42 Type 2 diabetes mellitus with diabetic polyneuropathy I10 Essential (primary) hypertension Facility Procedures : CPT4 Code Description: [id number] G0277-(Facility Use Only) HBOT full body chamber, , ICD-10 Diagnosis Description M86.9 Osteomyelitis, unspecified L97.514 Non-pressure chronic ulcer of other part of right foo E11.42 Type 2 diabetes mellitus with  diabetic polyneuropathy E11.621 Type 2 diabetes mellitus with foot ulcer Modifier: t with necrosis Quantity: 4 of bone Physician Procedures : CPT4 Code Description Modifier [id number] - WC PHYS HYPERBARIC OXYGEN THERAPY ICD-10 Diagnosis Description M86.9 Osteomyelitis, unspecified 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

## 2023-01-25 ENCOUNTER — Encounter (HOSPITAL_BASED_OUTPATIENT_CLINIC_OR_DEPARTMENT_OTHER): Payer: No Typology Code available for payment source | Admitting: General Surgery

## 2023-01-25 DIAGNOSIS — E11621 Type 2 diabetes mellitus with foot ulcer: Secondary | ICD-10-CM | POA: Diagnosis not present

## 2023-01-25 DIAGNOSIS — M86671 Other chronic osteomyelitis, right ankle and foot: Secondary | ICD-10-CM | POA: Diagnosis not present

## 2023-01-25 LAB — GLUCOSE, CAPILLARY
Glucose-Capillary: 202 mg/dL — ABNORMAL HIGH (ref 70–99)
Glucose-Capillary: 153 mg/dL — ABNORMAL HIGH (ref 70–99)

## 2023-01-25 NOTE — Progress Notes (Signed)
SEBASTHIAN, STAILEY ([id number]) [id number] Page 1 of 2 Visit Report for 01/24/2023 Arrival Information Details Patient Name: Date of Service: [name], [name] 01/24/2023 12:00 PM Medical Record Number: [id number] Patient Account Number: [id number] Date of Birth/Sex: Treating RN: 1951-01-25 (72 y.o. [name] Primary Care Alajia Schmelzer: [name] Other Other Clinician: [name] Referring Fusako Tanabe: Treating Jolayne Branson/Extender: [name] Weeks in Treatment: 14 Visit Information History Since Last Visit All ordered tests and consults were completed: Yes Patient Arrived: Cane Added or deleted any medications: No Arrival Time: 11:19 Any new allergies or adverse reactions: No Accompanied By: None Had a fall or experienced change in No Transfer Assistance: None activities of daily living that may affect Patient Identification Verified: Yes risk of falls: Secondary Verification Process Completed: Yes Signs or symptoms of abuse/neglect since last visito No Patient Requires Transmission-Based Precautions: No Hospitalized since last visit: No Patient Has Alerts: No Implantable device outside of the clinic excluding No cellular tissue based products placed in the center since last visit: Pain Present Now: No Electronic Signature(s) Signed: 01/24/2023 12:39:01 PM By: [name] EMT Entered By: [name] on 01/24/2023 12:39:01 -------------------------------------------------------------------------------- Encounter Discharge Information Details Patient Name: Date of Service: [facility] [name], [name] 01/24/2023 12:00 PM Medical Record Number: [id number] Patient Account Number: [id number] Date of Birth/Sex: Treating RN: 1950/12/05 (72 y.o. [name] Primary Care Rabon Scholle: [name] Other Other Clinician: [name] Referring Nolia Tschantz: Treating Dyonna Jaspers/Extender: [name] Weeks in Treatment: 14 Encounter Discharge  Information Items Discharge Condition: Stable Ambulatory Status: Cane Discharge Destination: Home Transportation: Private Auto Accompanied By: None Schedule Follow-up Appointment: Yes Clinical Summary of Care: Electronic Signature(s) Signed: 01/24/2023 2:20:07 PM By: [name] EMT Entered By: [name] on 01/24/2023 14:20:07 -------------------------------------------------------------------------------- Vitals Details Patient Name: Date of Service: [facility] [name], [name] 01/24/2023 12:00 PM Medical Record Number: [id number] Patient Account Number: [id number] Date of Birth/Sex: Treating RN: June 07, 1951 (72 y.o. [name] Primary Care Adea Geisel: [name] Other Other Clinician: [name] Referring Kainoa Swoboda: Treating Abdurahman Rugg/Extender: [name] Weeks in Treatment: 14 Vital Signs Time Taken: 11:22 Temperature (F): 98.3 [place], Ryley ([id number]) [id number].pdf Page 2 of 2 Height (in): 72 Pulse (bpm): 94 Weight (lbs): 185 Respiratory Rate (breaths/min): 18 Body Mass Index (BMI): 25.1 Blood Pressure (mmHg): 153/64 Capillary Blood Glucose (mg/dl): 433 Reference Range: 80 - 120 mg / dl Electronic Signature(s) Signed: 01/24/2023 12:39:33 PM By: [name] EMT Entered By: [name] on 01/24/2023 12:39:33

## 2023-01-25 NOTE — Progress Notes (Signed)
[place], Nicklos ([id number]) [id number] Page 1 of 2 Visit Report for 01/24/2023 HBO Details Patient Name: Date of Service: [name], [name] 01/24/2023 12:00 PM Medical Record Number: [id number] Patient Account Number: [id number] Date of Birth/Sex: Treating RN: Apr 08, 1951 (71 y.o. 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 HBO Treatment Details Treatment Number: 46 Patient Type: Outpatient Chamber Type: Monoplace Chamber Serial #: [id number] Treatment Protocol: 2.5 ATA with 90 minutes oxygen, with two 5 minute air breaks Treatment Details Compression Rate Down: 2.0 psi / minute De-Compression Rate Up: A breaks and breathing ir Compress Tx Pressure periods Decompress Decompress Begins Reached (leave unused spaces Begins Ends blank) Chamber Pressure (ATA 1 2.5 2.5 2.5 2.5 2.5 - - 2.5 1 ) Clock Time (24 hr) 11:43 11:58 12:28 12:33 13:03 13:08 - - 13:38 13:48 Treatment Length: 125 (minutes) Treatment Segments: 4 Vital Signs Capillary Blood Glucose Reference Range: 80 - 120 mg / dl HBO Diabetic Blood Glucose Intervention Range: <131 mg/dl or >782 mg/dl Time Vitals Blood Respiratory Capillary Blood Glucose Pulse Action Type: Pulse: Temperature: Taken: Pressure: Rate: Glucose (mg/dl): Meter #: Oximetry (%) Taken: Pre 11:22 153/64 94 18 98.3 140 Post 13:53 140/62 83 18 97.5 169 Treatment Response Treatment Toleration: Well Treatment Completion Status: Treatment Completed without Adverse Event Physician HBO Attestation: I certify that I supervised this HBO treatment in accordance with  Medicare guidelines. A trained emergency response team is readily available per Yes hospital policies and procedures. Continue HBOT as ordered. Yes Electronic Signature(s) Signed: 01/24/2023 2:35:33 PM By: [name] Guess MD FACS Previous Signature: 01/24/2023 2:18:47 PM Version By: [name] EMT Previous Signature: 01/24/2023 1:01:52 PM Version By: [name] EMT Entered By: [name] on 01/24/2023 14:35:33 -------------------------------------------------------------------------------- HBO Safety Checklist Details Patient Name: Date of Service: [name], [name] 01/24/2023 12:00 PM Medical Record Number: [id number] Patient Account Number: [id number] Date of Birth/Sex: Treating RN: 09-30-1951 (72 y.o. [name] Primary Care Lekendrick Alpern: [name] Other Other Clinician: Karl [name] Referring Harce Volden: Treating Nakina Spatz/Extender: [name] Weeks in Treatment: 14 HBO Safety Checklist Items [name] ([id number]) [id number] Page 2 of 2 Safety Checklist Consent Form Signed Patient voided / foley secured and emptied When did you last eato 1000 Last dose of injectable or oral agent Yesterday Ostomy pouch emptied and vented if applicable NA All implantable devices assessed, documented and approved Dialysis shunt Intravenous access site secured and place NA Valuables secured Linens and cotton and cotton/polyester blend (less than 51% polyester) Personal oil-based products / skin lotions / body lotions removed Wigs or hairpieces removed NA Smoking or tobacco materials removed Books / newspapers / magazines / loose paper removed Cologne, aftershave, perfume and deodorant removed Jewelry removed (may wrap wedding band) Make-up removed NA Hair care products removed Battery operated devices (external) removed Heating patches and chemical warmers removed Titanium eyewear removed NA Nail polish cured greater than 10  hours NA Casting material cured greater than 10 hours NA Hearing aids removed NA Loose dentures or partials removed removed by patient Prosthetics have been removed NA Patient demonstrates correct use of air break device (if applicable) Patient concerns have been addressed Patient grounding bracelet on and cord attached to chamber Specifics for Inpatients (complete in addition to  above) Medication sheet sent with patient NA Intravenous medications needed or due during therapy sent with patient NA Drainage tubes (e.g. nasogastric tube or chest tube secured and vented) NA Endotracheal or Tracheotomy tube secured NA Cuff deflated of air and inflated with saline NA Airway suctioned NA Notes The safety checklist was done before the treatment was started. Electronic Signature(s) Signed: 01/24/2023 1:00:57 PM By: [name] EMT Entered By: [name] on 01/24/2023 13:00:57

## 2023-01-25 NOTE — Progress Notes (Signed)
JAQUALYN, JUDAY ([id number]) [id number] Page 1 of 2 Visit Report for 01/24/2023 Problem List Details Patient Name: Date of Service: [name], [name] 01/24/2023 12:00 PM Medical Record Number: [id number] Patient Account Number: [id number] Date of Birth/Sex: Treating RN: 06/12/1951 (72 y.o. 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 E11.621 Type 2 diabetes mellitus with foot ulcer 10/12/2022 No Yes N18.5 Chronic kidney disease, stage 5 10/12/2022 No Yes M86.9 Osteomyelitis, unspecified 10/12/2022 No Yes E11.42 Type 2 diabetes mellitus with diabetic polyneuropathy 10/12/2022 No Yes I10 Essential (primary) hypertension 10/12/2022 No Yes Inactive Problems Resolved Problems Electronic Signature(s) Signed: 01/24/2023 2:19:29 PM By: [name] EMT Signed: 01/24/2023 2:34:18 PM By: [name] MD FACS Entered By: [name] on 01/24/2023 14:19:29 -------------------------------------------------------------------------------- SuperBill Details Patient Name: Date of Service: [facility] [name], [name] 01/24/2023 Medical Record Number: [id number] Patient Account Number: [id number] Date of Birth/Sex: Treating RN: 04-01-1951 (72 y.o. 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.621 Type 2 diabetes mellitus with foot ulcer Baity, Azrael ([id number])  [phone].pdf Page 2 of 2 N18.5 Chronic kidney disease, stage 5 M86.9 Osteomyelitis, unspecified E11.42 Type 2 diabetes mellitus with diabetic polyneuropathy I10 Essential (primary) hypertension Facility Procedures : CPT4 Code Description: [id number] G0277-(Facility Use Only) HBOT full body chamber, , ICD-10 Diagnosis Description M86.9 Osteomyelitis, unspecified L97.514 Non-pressure chronic ulcer of other part of right foo E11.42 Type 2 diabetes mellitus with  diabetic polyneuropathy E11.621 Type 2 diabetes mellitus with foot ulcer Modifier: t with necrosis Quantity: 4 of bone Physician Procedures : CPT4 Code Description Modifier [id number] - WC PHYS HYPERBARIC OXYGEN THERAPY ICD-10 Diagnosis Description M86.9 Osteomyelitis, unspecified 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

## 2023-01-26 ENCOUNTER — Encounter (HOSPITAL_BASED_OUTPATIENT_CLINIC_OR_DEPARTMENT_OTHER): Payer: No Typology Code available for payment source | Admitting: General Surgery

## 2023-01-26 DIAGNOSIS — E11621 Type 2 diabetes mellitus with foot ulcer: Secondary | ICD-10-CM | POA: Diagnosis not present

## 2023-01-26 DIAGNOSIS — M86671 Other chronic osteomyelitis, right ankle and foot: Secondary | ICD-10-CM | POA: Diagnosis not present

## 2023-01-26 DIAGNOSIS — L97516 Non-pressure chronic ulcer of other part of right foot with bone involvement without evidence of necrosis: Secondary | ICD-10-CM | POA: Diagnosis not present

## 2023-01-26 LAB — GLUCOSE, CAPILLARY
Glucose-Capillary: 205 mg/dL — ABNORMAL HIGH (ref 70–99)
Glucose-Capillary: 110 mg/dL — ABNORMAL HIGH (ref 70–99)

## 2023-01-26 NOTE — Progress Notes (Signed)
[name], [name] ([id number]) [id number] Page 1 of 2 Visit Report for 01/25/2023 Arrival Information Details Patient Name: Date of Service: [name], [name] 01/25/2023 12:00 PM Medical Record Number: [id number] Patient Account Number: [id number] Date of Birth/Sex: Treating RN: [birth year] (72 y.o. [name], [place] [name]: [name] Other Other Clinician: [name] Referring Brynnleigh Mcelwee: Treating [name]/Extender: [name]: 15 Visit Information History Since Last Visit All ordered tests and consults were completed: Yes Patient Arrived: Cane Added or deleted any medications: No Arrival Time: 10:57 Any new allergies or adverse reactions: No Accompanied By: self Had a fall or experienced change in No Transfer Assistance: None activities of daily living that may affect Patient Identification Verified: Yes risk of falls: Secondary Verification Process Completed: Yes Signs or symptoms of abuse/neglect since last visito No Patient Requires Transmission-Based Precautions: No Hospitalized since last visit: No Patient Has Alerts: No Implantable device outside of the clinic excluding No cellular tissue based products placed in the [name] since last visit: Pain Present Now: No Electronic Signature(s) Signed: 01/25/2023 3:30:09 PM By: [name] CHT EMT BS , , Entered By: [name] on 01/25/2023 15:30:09 -------------------------------------------------------------------------------- Encounter Discharge Information Details Patient Name: Date of Service: [name], [name] 01/25/2023 12:00 PM Medical Record Number: [id number] Patient Account Number: [id number] Date of Birth/Sex: Treating RN: [birth year] (72 y.o. [name] Primary Care [name]: [name] Other Other Clinician: [name] Referring Nyasia Baxley: Treating [name]/Extender: [name]:  15 Encounter Discharge Information Items Discharge Condition: Stable Ambulatory Status: Cane Discharge Destination: Home Transportation: Private Auto Accompanied By: self Schedule Follow-up Appointment: No Clinical Summary of Care: Electronic Signature(s) Signed: 01/25/2023 3:42:21 PM By: [name] CHT EMT BS , , Entered By: [name] on 01/25/2023 15:42:21 -------------------------------------------------------------------------------- Vitals Details Patient Name: Date of Service: [name] [name], [name] 01/25/2023 12:00 PM Medical Record Number: [id number] Patient Account Number: [id number] Date of Birth/Sex: Treating RN: August 05, 1951 (72 y.o. [name] Primary Care [name]: [name] Other Other Clinician: Karl [name] Referring [name]: Treating [name]/Extender: [name]: 15 Vital Signs Time Taken: 11:02 Temperature (F): 98.6 [name] ([id number]) [phone].[id number] Page 2 of 2 Height (in): 72 Pulse (bpm): 88 Weight (lbs): 185 Respiratory Rate (breaths/min): 16 Body Mass Index (BMI): 25.1 Blood Pressure (mmHg): 146/58 Capillary Blood Glucose (mg/dl): 433 Reference Range: 80 - 120 mg / dl Electronic Signature(s) Signed: 01/25/2023 3:30:42 PM By: [name] CHT EMT BS , , Entered By: [name] on 01/25/2023 15:30:42

## 2023-01-26 NOTE — Progress Notes (Signed)
BURNHAM, TROST ([id number]) [id number] Page 1 of 1 Visit Report for 01/25/2023 SuperBill Details Patient Name: Date of Service: [name], [name] 01/25/2023 Medical Record Number: [id number] Patient Account Number: [id number] Date of Birth/Sex: Treating RN: Feb 22, 1951 (72 y.o. 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, stage 5 M86.9 Osteomyelitis, unspecified E11.42 Type 2 diabetes mellitus with diabetic polyneuropathy I10 Essential (primary) hypertension Facility Procedures CPT4 Code Description Modifier Quantity [id number]-(Facility Use Only) HBOT full body chamber, , 4 ICD-10 Diagnosis Description M86.9 Osteomyelitis, unspecified L97.514 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.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

## 2023-01-26 NOTE — Progress Notes (Signed)
Allport, Kiron ([id number]) [id number] Page 1 of 2 Visit Report for 01/25/2023 HBO Details Patient Name: Date of Service: [name], [name] 01/25/2023 12:00 PM Medical Record Number: [id number] Patient Account Number: [id number] Date of Birth/Sex: Treating RN: [birth year] (72 y.o. 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 HBO Treatment Details Treatment Number: 47 Patient Type: Outpatient Chamber Type: Monoplace Chamber Serial #: [id number] Treatment Protocol: 2.5 ATA with 90 minutes oxygen, with two 5 minute air breaks Treatment Details Compression Rate Down: 1.5 psi / minute De-Compression Rate Up: 2.0 psi / minute A breaks and breathing ir Compress Tx Pressure periods Decompress Decompress Begins Reached (leave unused spaces Begins Ends blank) Chamber Pressure (ATA 1 2.5 2.5 2.5 2.5 2.5 - - 2.5 1 ) Clock Time (24 hr) 11:17 11:29 11:59 12:04 12:34 12:39 - - 13:09 13:18 Treatment Length: 121 (minutes) Treatment Segments: 4 Vital Signs Capillary Blood Glucose Reference Range: 80 - 120 mg / dl HBO Diabetic Blood Glucose Intervention Range: <131 mg/dl or >782 mg/dl Type: Time Vitals Blood Respiratory Capillary Blood Glucose Pulse Action Pulse: Temperature: Taken: Pressure: Rate: Glucose (mg/dl): Meter #: Oximetry (%) Taken: Pre 11:02 146/58 88 16 98.6 202 1 none per protocol Post 13:22 132/58 89 18 97.8 153 1 none per protocol Treatment Response Treatment Toleration: Well Treatment Completion Status: Treatment Completed without Adverse Event Treatment Notes Mr. Dible arrived,  blood glucose was measured. Patient prepared for treatment. Vital signs were normal. After performing a safety check, patient was placed in the chamber which was compressed with 100% oxygen at a rate of 2 psi/min after confirming normal ear equalization. He tolerated the treatment and the chamber decompression that followed (2 psi/min). He denied any issues with ear equalization and/or pain. He was stable upon discharge. Physician HBO Attestation: I certify that I supervised this HBO treatment in accordance with Medicare guidelines. A trained emergency response team is readily available per Yes hospital policies and procedures. Continue HBOT as ordered. Yes Electronic Signature(s) Signed: 01/25/2023 4:26:47 PM By: [name] Guess MD FACS Previous Signature: 01/25/2023 3:40:45 PM Version By: [name] CHT EMT BS , , Entered By: [name] on 01/25/2023 16:26:46 -------------------------------------------------------------------------------- HBO Safety Checklist Details Patient Name: Date of Service: [name], [name] [name] 01/25/2023 12:00 PM Medical Record Number: [id number] Patient Account Number: [id number] Date of Birth/Sex: Treating RN: 05-25-1951 (72 y.o. [name] Primary Care Ranetta Armacost: [name] Other Other Clinician: [name] [name], [name] ([id number]) [id number] Page 2 of 2 Referring Sloan Takagi: Treating [name]/Extender: [name] Weeks in Treatment: 15 HBO Safety Checklist Items Safety Checklist Consent Form Signed Patient voided / foley secured and emptied When did you last eato 0530 Last dose of injectable or oral agent Yesterday Ostomy pouch emptied and vented if applicable NA All implantable devices assessed, documented and approved NA Intravenous access site secured and place Shunt Valuables secured Linens and cotton and cotton/polyester blend (less than 51% polyester) Personal oil-based products / skin  lotions / body lotions removed Wigs or hairpieces removed NA Smoking or tobacco materials removed NA Books / newspapers / magazines / loose paper removed Cologne, aftershave, perfume and deodorant removed Jewelry removed (may wrap wedding band) Make-up removed NA Hair care products removed Battery operated  devices (external) removed Heating patches and chemical warmers removed Titanium eyewear removed Nail polish cured greater than 10 hours NA Casting material cured greater than 10 hours NA Hearing aids removed NA Loose dentures or partials removed dentures removed Prosthetics have been removed NA Patient demonstrates correct use of air break device (if applicable) Patient concerns have been addressed Patient grounding bracelet on and cord attached to chamber Specifics for Inpatients (complete in addition to above) Medication sheet sent with patient NA Intravenous medications needed or due during therapy sent with patient NA Drainage tubes (e.g. nasogastric tube or chest tube secured and vented) NA Endotracheal or Tracheotomy tube secured NA Cuff deflated of air and inflated with saline NA Airway suctioned NA Notes Paper version used prior to treatment. Electronic Signature(s) Signed: 01/25/2023 3:32:20 PM By: [name] CHT EMT BS , , Entered By: [name] on 01/25/2023 15:32:20

## 2023-01-27 NOTE — Progress Notes (Signed)
BOULDEN, Phong ([id number]) [id number] Page 1 of 8 Visit Report for 01/26/2023 Arrival Information Details Patient Name: Date of Service: [place], [name] 01/26/2023 10:30 A M Medical Record Number: [id number] Patient Account Number: [id number] Date of Birth/Sex: Treating RN: [birth year] (72 y.o. M) Primary Care Ellenora Talton: [name] Other Other Clinician: Referring Casee Knepp: Treating Rakin Lemelle/Extender: [name] Weeks in Treatment: 15 Visit Information History Since Last Visit All ordered tests and consults were completed: No Patient Arrived: Ambulatory Added or deleted any medications: No Arrival Time: 10:53 Any new allergies or adverse reactions: No Accompanied By: self Had a fall or experienced change in No Transfer Assistance: Manual activities of daily living that may affect Patient Identification Verified: Yes risk of falls: Secondary Verification Process Completed: Yes Signs or symptoms of abuse/neglect since last visito No Patient Requires Transmission-Based Precautions: No Hospitalized since last visit: No Patient Has Alerts: No Implantable device outside of the clinic excluding No cellular tissue based products placed in the center since last visit: Has Dressing in Place as Prescribed: Yes Pain Present Now: No Electronic Signature(s) Signed: 01/26/2023 4:53:34 PM By: Zenaida Deed RN, BSN Entered By: Zenaida Deed on 01/26/2023 11:14:47 -------------------------------------------------------------------------------- Complex / Palliative Patient Assessment Details Patient Name: Date of Service: [name], [name] 01/26/2023 10:30 A M Medical Record Number: [id number] Patient Account Number: [id number] Date of Birth/Sex: Treating RN: 08/22/1951 (73 y.o. [name] Primary Care Lyndie Vanderloop: [name] Other Other Clinician: Referring Samaya Boardley: Treating Domonik Levario/Extender: [name] Weeks in Treatment:  15 Complex Wound Management Criteria Patient has remarkable or complex co-morbidities requiring medications or treatments that extend wound healing times. Examples: Diabetes mellitus with chronic renal failure or end stage renal disease requiring dialysis Advanced or poorly controlled rheumatoid arthritis Diabetes mellitus and end stage chronic obstructive pulmonary disease Active cancer with current chemo- or radiation therapy diabetes, ESRD with HD, osteomyelitis, receiving HBO Palliative Wound Management Criteria Care Approach Wound Care Plan: Complex Wound Management Electronic Signature(s) Signed: 01/26/2023 12:17:23 PM By: [name] MD FACS Signed: 01/26/2023 4:53:34 PM By: Zenaida Deed RN, BSN Entered By: Zenaida Deed on 01/26/2023 11:54:52 -------------------------------------------------------------------------------- Encounter Discharge Information Details Patient Name: Date of Service: [facility], [name] 01/26/2023 10:30 A M Medical Record Number: [id number] Patient Account Number: [id number] Date of Birth/Sex: Treating RN: 12-10-1950 (72 y.o. [name] Primary Care Stephan Nelis: [name] Other Other Clinician: Referring Sarthak Rubenstein: Treating Shaindel Sweeten/Extender: [name] [place], [name] ([id number]) [id number] Page 2 of 8 Weeks in Treatment: 15 Encounter Discharge Information Items Post Procedure Vitals Discharge Condition: Stable Temperature (F): 97.9 Ambulatory Status: Cane Pulse (bpm): 101 Discharge Destination: Home Respiratory Rate (breaths/min): 18 Transportation: Private Auto Blood Pressure (mmHg): 171/68 Accompanied By: self Schedule Follow-up Appointment: Yes Clinical Summary of Care: Patient Declined Electronic Signature(s) Signed: 01/26/2023 4:53:34 PM By: Zenaida Deed RN, BSN Entered By: Zenaida Deed on 01/26/2023  16:13:04 -------------------------------------------------------------------------------- Lower Extremity Assessment Details Patient Name: Date of Service: [facility], [name] 01/26/2023 10:30 A M Medical Record Number: [id number] Patient Account Number: [id number] Date of Birth/Sex: Treating RN: May 19, 1951 (72 y.o. [name] Primary Care Cale Decarolis: [name] Other Other Clinician: Referring Etta Gassett: Treating Harlow Basley/Extender: [name] Weeks in Treatment: 15 Edema Assessment Assessed: [Left: No] [Right: No] Edema: [Left: N] [Right: o] Calf Left: Right: Point of Measurement: From Medial Instep 36 cm Ankle Left: Right: Point of Measurement: From Medial Instep 22 cm Vascular Assessment Pulses: Dorsalis Pedis Palpable: [Right:Yes] Electronic Signature(s) Signed: 01/26/2023 4:53:34 PM By: Zenaida Deed RN, BSN Entered  By: Zenaida Deed on 01/26/2023 11:16:01 -------------------------------------------------------------------------------- Multi Wound Chart Details Patient Name: Date of Service: [name], [name] 01/26/2023 10:30 A M Medical Record Number: [id number] Patient Account Number: [id number] Date of Birth/Sex: Treating RN: 17-Feb-1951 (72 y.o. M) Primary Care Beyla Loney: [name] Other Other Clinician: Referring Alphons Burgert: Treating Takuma Cifelli/Extender: [name] Weeks in Treatment: 15 Vital Signs Height(in): 72 Capillary Blood Glucose(mg/dl): 865 Weight(lbs): 784 Pulse(bpm): 101 Body Mass Index(BMI): 25.1 Blood Pressure(mmHg): 171/68 Temperature(F): 97.9 Respiratory Rate(breaths/min): 18 [Perales, Tashon ([id number]):Photos:] [[id number] Page 3 of 8:1 N/A N/A N/A N/A] Right, Medial T Great oe N/A N/A Wound Location: Pressure Injury N/A N/A Wounding Event: Diabetic Wound/Ulcer of the Lower N/A N/A Primary Etiology: Extremity Acute Osteomyelitis N/A N/A Secondary Etiology: Cataracts, Hypertension,  Type II N/A N/A Comorbid History: Diabetes, Osteomyelitis, Neuropathy, Confinement Anxiety 08/16/2022 N/A N/A Date Acquired: 15 N/A N/A Weeks of Treatment: Open N/A N/A Wound Status: No N/A N/A Wound Recurrence: 0.7x0.7x0.3 N/A N/A Measurements L x W x D (cm) 0.385 N/A N/A A (cm) : rea 0.115 N/A N/A Volume (cm) : -40.00% N/A N/A % Reduction in A rea: -4.50% N/A N/A % Reduction in Volume: Grade 3 N/A N/A Classification: Medium N/A N/A Exudate A mount: Serosanguineous N/A N/A Exudate Type: red, brown N/A N/A Exudate Color: Distinct, outline attached N/A N/A Wound Margin: Large (67-100%) N/A N/A Granulation A mount: Red N/A N/A Granulation Quality: Small (1-33%) N/A N/A Necrotic A mount: Fat Layer (Subcutaneous Tissue): Yes N/A N/A Exposed Structures: Tendon: Yes Joint: Yes Bone: Yes Fascia: No Muscle: No Small (1-33%) N/A N/A Epithelialization: Callus: Yes N/A N/A Periwound Skin Texture: Maceration: Yes N/A N/A Periwound Skin Moisture: No Abnormalities Noted N/A N/A Periwound Skin Color: No Abnormality N/A N/A Temperature: Cellular or Tissue Based Product N/A N/A Procedures Performed: Chemical Cauterization Treatment Notes Electronic Signature(s) Signed: 01/26/2023 11:42:48 AM By: [name] MD FACS Entered By: [name] on 01/26/2023 11:42:48 -------------------------------------------------------------------------------- Multi-Disciplinary Care Plan Details Patient Name: Date of Service: [place] [facility], [name] 01/26/2023 10:30 A M Medical Record Number: [id number] Patient Account Number: [id number] Date of Birth/Sex: Treating RN: Nov 06, 1950 (72 y.o. [name] Primary Care Carle Fenech: [name] Other Other Clinician: Referring Dionysios Massman: Treating Ralphael Southgate/Extender: [name] Weeks in Treatment: 15 Multidisciplinary Care Plan reviewed with physician Active Inactive HBO Nursing Diagnoses: Potential for barotraumas  to ears, sinuses, teeth, and lungs or cerebral gas embolism related to changes in atmospheric pressure inside hyperbaric oxygen chamber Potential for oxygen toxicity seizures related to delivery of 100% oxygen at an increased atmospheric pressure Heigl, Jayesh ([id number]) [id number] Page 4 of 8 Potential for pulmonary oxygen toxicity related to delivery of 100% oxygen at an increased atmospheric pressure Goals: Barotrauma will be prevented during HBO2 Date Initiated: 12/28/2022 T arget Resolution Date: 02/22/2023 Goal Status: Active Patient and/or family will be able to state/discuss factors appropriate to the management of their disease process during treatment Date Initiated: 12/28/2022 T arget Resolution Date: 02/22/2023 Goal Status: Active Patient will tolerate the hyperbaric oxygen therapy treatment Date Initiated: 12/28/2022 T arget Resolution Date: 02/22/2023 Goal Status: Active Patient will tolerate the internal climate of the chamber Date Initiated: 12/28/2022 T arget Resolution Date: 02/22/2023 Goal Status: Active Interventions: Administer the correct therapeutic gas delivery based on the patients needs and limitations, per physician order Assess and provide for patients comfort related to the hyperbaric environment and equalization of middle ear Assess for signs and symptoms related to adverse events, including but not limited to confinement anxiety, pneumothorax, oxygen toxicity  and baurotrauma Assess patient for any history of confinement anxiety Assess patient's knowledge and expectations regarding hyperbaric medicine and provide education related to the hyperbaric environment, goals of treatment and prevention of adverse events Implement protocols to decrease risk of pneumothorax in high risk patients Notes: Nutrition Nursing Diagnoses: Impaired glucose control: actual or potential Goals: Patient/caregiver will maintain therapeutic glucose  control Date Initiated: 10/12/2022 Target Resolution Date: 02/22/2023 Goal Status: Active Interventions: Provide education on elevated blood sugars and impact on wound healing Provide education on nutrition Treatment Activities: Dietary management education, guidance and counseling : 10/12/2022 Giving encouragement to exercise : 10/12/2022 Notes: Osteomyelitis Nursing Diagnoses: Infection: osteomyelitis Goals: Patient/caregiver will verbalize understanding of disease process and disease management Date Initiated: 10/12/2022 Target Resolution Date: 02/22/2023 Goal Status: Active Interventions: Assess for signs and symptoms of osteomyelitis resolution every visit Provide education on osteomyelitis Treatment Activities: MRI : 08/16/2022 Notes: Wound/Skin Impairment Nursing Diagnoses: Impaired tissue integrity Goals: Patient/caregiver will verbalize understanding of skin care regimen Date Initiated: 01/26/2023 Target Resolution Date: 02/22/2023 Goal Status: Active Ulcer/skin breakdown will have a volume reduction of 30% by week 4 Date Initiated: 10/12/2022 Date Inactivated: 01/26/2023 Target Resolution Date: 01/25/2023 Goal Status: Met MAYLON, SAILORS ([id number]) [id number] Page 5 of 8 Ulcer/skin breakdown will have a volume reduction of 50% by week 8 Date Initiated: 01/26/2023 Target Resolution Date: 02/22/2023 Goal Status: Active Interventions: Assess ulceration(s) every visit Provide education on smoking Treatment Activities: Skin care regimen initiated : 10/12/2022 Smoking cessation education : 10/12/2022 Notes: Electronic Signature(s) Signed: 01/26/2023 4:53:34 PM By: Zenaida Deed RN, BSN Entered By: Zenaida Deed on 01/26/2023 11:20:26 -------------------------------------------------------------------------------- Pain Assessment Details Patient Name: Date of Service: [facility], [name] 01/26/2023 10:30 A M Medical Record Number:  [id number] Patient Account Number: [id number] Date of Birth/Sex: Treating RN: 09-22-1951 (72 y.o. M) Primary Care Oluwatobi Ruppe: [name] Other Other Clinician: Referring Courtnay Petrilla: Treating Daundre Biel/Extender: [name] Weeks in Treatment: 15 Active Problems Location of Pain Severity and Description of Pain Patient Has Paino Yes Site Locations Pain Location: Generalized Pain With Dressing Change: No Duration of the Pain. Constant / Intermittento Constant Rate the pain. 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 Pain Management and Medication Current Pain Management: Medication: Yes Is the Current Pain Management Adequate: Adequate How does your wound impact your activities of daily livingo Sleep: No Bathing: No Appetite: No Relationship With Others: No Bladder Continence: No Emotions: No Bowel Continence: No Work: No Toileting: No Drive: No Dressing: No Hobbies: No Electronic Signature(s) Signed: 01/26/2023 4:53:34 PM By: Zenaida Deed RN, BSN Entered By: Zenaida Deed on 01/26/2023 11:15:55 Thumm, Carder ([id number]) [id number] Page 6 of 8 -------------------------------------------------------------------------------- Patient/Caregiver Education Details Patient Name: Date of Service: [facility], [name] 4/26/2024andnbsp10:30 A M Medical Record Number: [id number] Patient Account Number: [id number] Date of Birth/Gender: Treating RN: 1951/07/27 (72 y.o. [name] Primary Care Physician: [name] Other Other Clinician: Referring Physician: Treating Physician/Extender: Gracy Racer in Treatment: 15 Education Assessment Education Provided To: Patient Education Topics Provided Elevated Blood Sugar/ Impact on Healing: Methods: Explain/Verbal Responses: Reinforcements needed, State content  correctly Hyperbaric Oxygenation: Methods: Explain/Verbal Responses: Reinforcements needed, State content correctly Wound/Skin Impairment: Methods: Explain/Verbal Responses: Reinforcements needed, State content correctly Electronic Signature(s) Signed: 01/26/2023 4:53:34 PM By: Zenaida Deed RN, BSN Entered By: Zenaida Deed on 01/26/2023 11:20:55 -------------------------------------------------------------------------------- Wound Assessment Details Patient Name: Date of Service: [name], [name]  01/26/2023 10:30 A M Medical Record Number: [id number] Patient Account Number: [id number] Date of Birth/Sex: Treating RN: [birth year] (72 y.o. M) Primary Care Icholas Irby: [name] Other Other Clinician: Referring Serafina Topham: Treating Sanjuanita Condrey/Extender: [name] Weeks in Treatment: 15 Wound Status Wound Number: 1 Primary Diabetic Wound/Ulcer of the Lower Extremity Etiology: Wound Location: Right, Medial T Great oe Secondary Acute Osteomyelitis Wounding Event: Pressure Injury Etiology: Date Acquired: 08/16/2022 Wound Open Weeks Of Treatment: 15 Status: Clustered Wound: No Comorbid Cataracts, Hypertension, Type II Diabetes, Osteomyelitis, History: Neuropathy, Confinement Anxiety Photos Wound Measurements Length: (cm) 0.7 Width: (cm) 0.7 Karpowicz, Korben ([id number]) Depth: (cm) 0. Area: (cm) 0 Volume: (cm) 0 % Reduction in Area: -40% % Reduction in Volume: -4.5% [id number] Page 7 of 8 3 Epithelialization: Small (1-33%) .385 .115 Wound Description Classification: Grade 3 Wound Margin: Distinct, outline attached Exudate Amount: Medium Exudate Type: Serosanguineous Exudate Color: red, brown Foul Odor After Cleansing: No Slough/Fibrino Yes Wound Bed Granulation Amount: Large (67-100%) Exposed Structure Granulation Quality: Red Fascia Exposed: No Necrotic Amount: Small (1-33%) Fat Layer (Subcutaneous Tissue) Exposed:  Yes Necrotic Quality: Adherent Slough Tendon Exposed: Yes Muscle Exposed: No Joint Exposed: Yes Bone Exposed: Yes Periwound Skin Texture Texture Color No Abnormalities Noted: Yes No Abnormalities Noted: Yes Moisture Temperature / Pain No Abnormalities Noted: Yes Temperature: No Abnormality Treatment Notes Wound #1 (Toe Great) Wound Laterality: Right, Medial Cleanser Soap and Water Discharge Instruction: May shower and wash wound with dial antibacterial soap and water prior to dressing change. Wound Cleanser Discharge Instruction: Cleanse the wound with wound cleanser prior to applying a clean dressing using gauze sponges, not tissue or cotton balls. Peri-Wound Care Topical Primary Dressing Epicord Secondary Dressing ADAPTIC TOUCH 3x4.25 in Discharge Instruction: Apply over primary dressing as directed. Woven Gauze Sponge, Non-Sterile 4x4 in Discharge Instruction: Apply over primary dressing as directed. Secured With Conforming Stretch Gauze Bandage, Sterile 2x75 (in/in) Discharge Instruction: Secure with stretch gauze as directed. 51M Medipore Soft Cloth Surgical T 2x10 (in/yd) ape Discharge Instruction: Secure with tape as directed. Compression Wrap Compression Stockings Add-Ons Electronic Signature(s) Signed: 01/26/2023 12:24:58 PM By: [name] Scrape Entered By: [name] Scrape on 01/26/2023 11:20:05 -------------------------------------------------------------------------------- Vitals Details Patient Name: Date of Service: [facility] [name], [name] 01/26/2023 10:30 A M Medical Record Number: [id number] Patient Account Number: [id number] Date of Birth/Sex: Treating RN: 05/22/1951 (72 y.o. M) Primary Care Paytin Ramakrishnan: [name] Other Other Clinician: Glorious Peach ([id number]) [id number] Page 8 of 8 Referring Lydia Meng: Treating Paisly Fingerhut/Extender: [name] Weeks in Treatment: 15 Vital Signs Time Taken: 10:53 Temperature (F): 97.9 Height  (in): 72 Pulse (bpm): 101 Weight (lbs): 185 Respiratory Rate (breaths/min): 18 Body Mass Index (BMI): 25.1 Blood Pressure (mmHg): 171/68 Capillary Blood Glucose (mg/dl): 875 Reference Range: 80 - 120 mg / dl Notes glucose per pt report this am Electronic Signature(s) Signed: 01/26/2023 4:53:34 PM By: Zenaida Deed RN, BSN Entered By: Zenaida Deed on 01/26/2023 11:15:08

## 2023-01-27 NOTE — Progress Notes (Addendum)
ELMON, SHADER ([id number]) [id number] Page 1 of 2 Visit Report for 01/26/2023 Arrival Information Details Patient Name: Date of Service: [name], [name] 01/26/2023 12:00 PM Medical Record Number: [id number] Patient Account Number: [id number] Date of Birth/Sex: Treating RN: 02-09-1951 (72 y.o. [name], [name] Primary Care [name]: [name] Other Other Clinician: Karl [name] Referring [name]: Treating [name]/Extender: [name] Weeks in Treatment: 15 Visit Information History Since Last Visit All ordered tests and consults were completed: Yes Patient Arrived: [name] Added or deleted any medications: No Arrival Time: 10:49 Any new allergies or adverse reactions: No Accompanied By: None Had a fall or experienced change in No Transfer Assistance: None activities of daily living that may affect Patient Identification Verified: Yes risk of falls: Secondary Verification Process Completed: Yes Signs or symptoms of abuse/neglect since last visito No Patient Requires Transmission-Based Precautions: No Hospitalized since last visit: No Patient Has Alerts: No Implantable device outside of the clinic excluding No cellular tissue based products placed in the center since last visit: Pain Present Now: No Electronic Signature(s) Signed: 01/26/2023 12:12:36 PM By: [name] EMT Entered By: [name] on 01/26/2023 12:12:35 -------------------------------------------------------------------------------- Encounter Discharge Information Details Patient Name: Date of Service: [name] [name] [name] 01/26/2023 12:00 PM Medical Record Number: [id number] Patient Account Number: [id number] Date of Birth/Sex: Treating RN: 08/08/1951 (72 y.o. [name] Primary Care [name]: [name] Other Other Clinician: Karl [name] Referring Reilly Molchan: Treating [name]/Extender: [name] Weeks in Treatment: 15 Encounter Discharge  Information Items Discharge Condition: Stable Ambulatory Status: Cane Discharge Destination: Home Transportation: Private Auto Accompanied By: None Schedule Follow-up Appointment: Yes Clinical Summary of Care: Electronic Signature(s) Signed: 01/26/2023 2:53:56 PM By: [name] EMT Entered By: [name] on 01/26/2023 14:53:56 -------------------------------------------------------------------------------- Vitals Details Patient Name: Date of Service: [name] 01/26/2023 12:00 PM Medical Record Number: [id number] Patient Account Number: [id number] Date of Birth/Sex: Treating RN: [birth year] (72 y.o. [name] Primary Care Keniel Ralston: [name] Other Other Clinician: Karl [name] Referring [name]: Treating [name]/Extender: [name] Weeks in Treatment: 15 Vital Signs Time Taken: 10:57 Temperature (F): 97.9 [name], [name] ([id number]) [id number] Page 2 of 2 Height (in): 72 Pulse (bpm): 101 Weight (lbs): 185 Respiratory Rate (breaths/min): 18 Body Mass Index (BMI): 25.1 Blood Pressure (mmHg): 171/68 Capillary Blood Glucose (mg/dl): 244 Reference Range: 80 - 120 mg / dl Notes Patient v/s are from clinic visit. Electronic Signature(s) Signed: 01/26/2023 12:13:40 PM By: [name] EMT Entered By: [name] on 01/26/2023 12:13:39

## 2023-01-27 NOTE — Progress Notes (Signed)
[name], [name] ([id number]) [id number] Page 1 of 11 Visit Report for 01/26/2023 Chief Complaint Document Details Patient Name: Date of Service: [name], [name] 01/26/2023 10:30 A M Medical Record Number: [id number] Patient Account Number: [id number] Date of Birth/Sex: Treating RN: 1951-06-25 (72 y.o. M) Primary Care Provider: [name] Other Other Clinician: Referring Provider: Treating Provider/Extender: [name] Weeks in Treatment: 15 Information Obtained from: Patient Chief Complaint Patients presents for treatment of an open diabetic ulcer on the right great toe Electronic Signature(s) Signed: 01/26/2023 11:43:05 AM By: [name] MD FACS Entered By: [name] on 01/26/2023 11:43:04 -------------------------------------------------------------------------------- Cellular or Tissue Based Product Details Patient Name: Date of Service: [name], [name] 01/26/2023 10:30 A M Medical Record Number: [id number] Patient Account Number: [id number] Date of Birth/Sex: Treating RN: August 27, 1951 (72 y.o. [name] Primary Care Provider: [name] Other Other Clinician: Referring Provider: Treating Provider/Extender: [name] Weeks in Treatment: 15 Cellular or Tissue Based Product Type Wound #1 Right,Medial T Great oe Applied to: Performed By: Physician [name], MD Cellular or Tissue Based Product Type: Epicord Level of Consciousness (Pre-procedure): Awake and Alert Pre-procedure Verification/Time Out Yes - 11:20 Taken: Location: genitalia / hands / feet / multiple digits Wound Size (sq cm): 0.49 Product Size (sq cm): 6 Waste Size (sq cm): 5 Waste Reason: wound size Amount of Product Applied (sq cm): 1 Instrument Used: Forceps, Scissors Lot #: [id number] Order #: 3 Expiration Date: 09/02/2027 Fenestrated: No Reconstituted: Yes Solution Type: saline Solution Amount: 2 ml Lot #:  [id number] Solution Expiration Date: 03/01/2025 Secured: Yes Secured With: Steri-Strips Dressing Applied: Yes Primary Dressing: adaptic, gauze Procedural Pain: 0 Post Procedural Pain: 0 Response to Treatment: Procedure was tolerated well Level of Consciousness (Post- Awake and Alert procedure): Post Procedure Diagnosis Same as Pre-procedure Electronic Signature(s) Signed: 01/26/2023 12:17:23 PM By: [name] MD FACS [place], [name] ([id number]) [id number] Page 2 of 11 Signed: 01/26/2023 4:53:34 PM By: Zenaida Deed RN, BSN Entered By: Zenaida Deed on 01/26/2023 11:30:48 -------------------------------------------------------------------------------- HPI Details Patient Name: Date of Service: [name], [name] 01/26/2023 10:30 A M Medical Record Number: [id number] Patient Account Number: [id number] Date of Birth/Sex: Treating RN: [birth year] (72 y.o. M) Primary Care Provider: [name] Other Other Clinician: Referring Provider: Treating Provider/Extender: [name] Weeks in Treatment: 15 History of Present Illness HPI Description: ADMISSION 10/12/2022 This is a 72 year old type II diabetic (last hemoglobin A1c 7.7 in October 2023) with stage V chronic kidney disease and hypertension. He has been followed by podiatry for an ulcer on his right great toe. He has a history of prior toe and foot infections requiring amputation. In November 2023, he had an outpatient MRI that demonstrated osteomyelitis. He was admitted to the [name] for IV antibiotics and subsequently underwent irrigation and debridement of the toe with biopsy and placement of married 3 layer matrix. Cultures grew out MRSA and osteomyelitis was seen on pathology. He was followed by infectious disease and given a course of doxycycline and Augmentin. He completed this course at the end of December. He has been evaluated by vascular surgery with formal ABIs as well as  consultation. He does have adequate blood flow at least to the dorsalis pedis. He last saw podiatry on January 4 of this year where he underwent debridement. It was felt he would benefit from specialized wound care and he was referred to our center for further evaluation and management. On the dorsal aspect of his right great toe, there is a circular ulcer.  There is a fair amount of undermining from callus and dry skin. The surface is a bit pale and fibrotic, but there is no obvious necrosis. 10/19/2022: He has been approved by his insurance to undergo hyperbaric oxygen therapy. EKG has been obtained and is normal. We are still waiting on his chest x-ray. The wound is clean with just a little bit of periwound callus and slough on the surface. Tendon is exposed. 10/26/2022: Unfortunately, the required co-pay from his insurance is cost prohibitive for him and he will not be able to receive hyperbaric oxygen therapy. Some moisture got under the dry skin around his wound and lifted it off but there is good epithelialized tissue beneath this. The exposed tendon has deteriorated. 11/03/2022: After some discussion with the financial office, it has become feasible for the patient to undergo hyperbaric oxygen therapy and he has received his first treatments this week. The wound is a little bit smaller and the tissue has a better color to it, but there is still extensive nonviable subcutaneous tissue and slough present. 11/09/2022: The wound measured a little bit smaller today. There is still slough and nonviable subcu tissue and tendon present. 11/16/2022: The wound continues to contract. There is still substantial depth to it, however, and bone remains exposed at the base. This is also a (early) 30-day update for HBO. 11/27/2022: Unfortunately, the wound has deteriorated. It appears that the tissue has become macerated. There is a second ulcer on the lateral aspect of his great toe. The nail is lifting off of the bed  and there is wet skin lifting off of the underlying tissue. The initial wound is smaller but still has a fair amount of depth to it. The patient does report quite a bit more drainage since his last visit. 12/20/2022: For scheduling reasons, somehow we have missed having a wound care visit in between his hyperbaric oxygen therapy treatments. The 2 openings of the wound have converged into 1 large site. There is necrotic tendon present at the lateral aspect of the great toe. The bone is visible, including the joint space. The area of the initial wound is flush with the surrounding skin. No malodor or purulent drainage. 12/28/2022: The wound is vastly improved this week. The 2 areas of the wound have separated due to tissue bridging and healing the space between. There is no more necrotic tissue visible, although the bone/joint space remains exposed. 01/04/2023: His wound continues to improve. The more medial aspect of the toe is nearly closed. Bone remains exposed laterally. He has been approved for Epicord. He is going to start dialysis next week. 01/11/2023: The medial aspect of the wound is closed completely. The lateral open portion of his wound has contracted and is shallower. There is still bone exposure. He has started dialysis. We are going to apply Epicord today. 01/19/2023: There is good granulation tissue starting to fill in his wound. Bone remains exposed, however. 01/26/2023: The wound is smaller again today. The granulation tissue is a bit hypertrophic. There is still an area of bone that remains exposed. Electronic Signature(s) Signed: 01/26/2023 11:43:37 AM By: [name] MD FACS Entered By: [name] on 01/26/2023 11:43:36 -------------------------------------------------------------------------------- Chemical Cauterization Details Patient Name: Date of Service: [name], [name] 01/26/2023 10:30 A M Medical Record Number: [id number] Patient Account Number: [id number] Date of  Birth/Sex: Treating RN: 02/09/1951 (72 y.o. [name] Primary Care Provider: [name] Other Other Clinician: Referring Provider: Treating Provider/Extender: [name] Weeks in Treatment: [phone]  [name], [name] ([id number]) [id number] Page 3 of 11 Procedure Performed for: Wound #1 Right,Medial T Great oe Performed By: Physician [name], MD Post Procedure Diagnosis Same as Pre-procedure Notes using silver nitrate stick Electronic Signature(s) Signed: 01/26/2023 12:17:23 PM By: [name] MD FACS Signed: 01/26/2023 4:53:34 PM By: Zenaida Deed RN, BSN Entered By: Zenaida Deed on 01/26/2023 11:27:29 -------------------------------------------------------------------------------- Physical Exam Details Patient Name: Date of Service: [name] [place] [name], [name] 01/26/2023 10:30 A M Medical Record Number: [id number] Patient Account Number: [id number] Date of Birth/Sex: Treating RN: 03-07-1951 (72 y.o. M) Primary Care Provider: [name] Other Other Clinician: Referring Provider: Treating Provider/Extender: [name] Weeks in Treatment: 15 Constitutional Hypertensive, asymptomatic. Slightly tachycardic. . . no acute distress. Respiratory Normal work of breathing on room air. Notes 01/26/2023: The wound is smaller again today. The granulation tissue is a bit hypertrophic. There is still an area of bone that remains exposed. Electronic Signature(s) Signed: 01/26/2023 11:44:13 AM By: [name] MD FACS Entered By: [name] on 01/26/2023 11:44:13 -------------------------------------------------------------------------------- Physician Orders Details Patient Name: Date of Service: [name], [name] 01/26/2023 10:30 A M Medical Record Number: [id number] Patient Account Number: [id number] Date of Birth/Sex: Treating RN: September 26, 1951 (72 y.o. [name] Primary Care Provider: [name] Other Other  Clinician: Referring Provider: Treating Provider/Extender: [name] Weeks in Treatment: 15 Verbal / Phone Orders: No Diagnosis Coding ICD-10 Coding Code Description L97.514 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.621 Type 2 diabetes mellitus with foot ulcer N18.5 Chronic kidney disease, stage 5 M86.9 Osteomyelitis, unspecified E11.42 Type 2 diabetes mellitus with diabetic polyneuropathy I10 Essential (primary) hypertension Follow-up Appointments ppointment in 1 week. - Dr. Lady Gary Rm 2 Return A Friday 5/3 @ 11:30 am Friday 5/10 @ 10:30 RM 1 prior to HBO Anesthetic Wound #1 [name], [name] ([id number]) [id number] Page 4 of 11 (In clinic) Topical Lidocaine 4% applied to wound bed Cellular or Tissue Based Products Cellular or Tissue Based Product Type: - epicord #3 Cellular or Tissue Based Product applied to wound bed, secured with steri-strips, cover with Adaptic or Mepitel. (DO NOT REMOVE). Bathing/ Shower/ Hygiene May shower with protection but do not get wound dressing(s) wet. Protect dressing(s) with water repellant cover (for example, large plastic bag) or a cast cover and may then take shower. Off-Loading Wound #1 Right,Medial T Great oe Other: - Diabetic shoe Hyperbaric Oxygen Therapy Evaluate for HBO Therapy Indication: - chronic refractory osteomyelitis If appropriate for treatment, begin HBOT per protocol: 2.5 ATA for 90 Minutes with 2 Five (5) Minute A Breaks ir Total Number of Treatments: - 40 plus 40 (12/20/22) One treatments per day (delivered Monday through Friday unless otherwise specified in Special Instructions below): Finger stick Blood Glucose Pre- and Post- HBOT Treatment. Follow Hyperbaric Oxygen Glycemia Protocol Give two 4oz orange juices in addition to Glucerna when the glycemic protocol is used. A frin (Oxymetazoline HCL) 0.05% nasal spray  - 1 spray in both nostrils daily as needed prior to HBO treatment for difficulty clearing ears Wound Treatment Wound #1 - T Great oe Wound Laterality: Right, Medial Cleanser: Soap and Water 1 x Per Week/30 Days Discharge Instructions: May shower and wash wound with dial antibacterial soap and water prior to dressing change. Cleanser: Wound Cleanser 1 x Per Week/30 Days Discharge Instructions: Cleanse the wound with wound cleanser prior to applying a clean dressing using gauze sponges, not tissue or cotton balls. Prim Dressing: Epicord ary 1 x  Per Week/30 Days Secondary Dressing: ADAPTIC TOUCH 3x4.25 in 1 x Per Week/30 Days Discharge Instructions: Apply over primary dressing as directed. Secondary Dressing: Woven Gauze Sponge, Non-Sterile 4x4 in 1 x Per Week/30 Days Discharge Instructions: Apply over primary dressing as directed. Secured With: Insurance underwriter, Sterile 2x75 (in/in) 1 x Per Week/30 Days Discharge Instructions: Secure with stretch gauze as directed. Secured With: 65M Medipore Scientist, research (life sciences) Surgical T 2x10 (in/yd) 1 x Per Week/30 Days ape Discharge Instructions: Secure with tape as directed. GLYCEMIA INTERVENTIONS PROTOCOL PRE-HBO GLYCEMIA INTERVENTIONS ACTION INTERVENTION Obtain pre-HBO capillary blood glucose (ensure 1 physician order is in chart). A. Notify HBO physician and await physician orders. 2 If result is 70 mg/dl or below: B. If the result meets the [name] definition of a critical result, follow [name] policy. A. Give patient an 8 ounce Glucerna Shake, an 8 ounce Ensure, or 8 ounces of a Glucerna/Ensure equivalent dietary supplement*. B. Wait 30 minutes. If result is 71 mg/dl to 161 mg/dl: C. Retest patients capillary blood glucose (CBG). D. If result greater than or equal to 110 mg/dl, proceed with HBO. If result less than 110 mg/dl, notify HBO physician and consider holding HBO. If result is 131 mg/dl to 096 mg/dl: A. Proceed with  HBO. A. Notify HBO physician and await physician orders. B. It is recommended to hold HBO and do If result is 250 mg/dl or greater: blood/urine ketone testing. C. If the result meets the [name] definition of a critical result, follow [name] policy. POST-HBO GLYCEMIA INTERVENTIONS ACTION INTERVENTION Obtain post HBO capillary blood glucose (ensure 1 physician order is in chart). A. Notify HBO physician and await physician [name], [name] ([id number]) [id number] Page 5 of 11 orders. 2 If result is 70 mg/dl or below: B. If the result meets the [name] definition of a critical result, follow [name] policy. A. Give patient an 8 ounce Glucerna Shake, an 8 ounce Ensure, or 8 ounces of a Glucerna/Ensure equivalent dietary supplement*. B. Wait 15 minutes for symptoms of If result is 71 mg/dl to 914 mg/dl: hypoglycemia (i.e. nervousness, anxiety, sweating, chills, clamminess, irritability, confusion, tachycardia or dizziness). C. If patient asymptomatic, discharge patient. If patient symptomatic, repeat capillary blood glucose (CBG) and notify HBO physician. If result is 101 mg/dl to 782 mg/dl: A. Discharge patient. A. Notify HBO physician and await physician orders. B. It is recommended to do blood/urine ketone If result is 250 mg/dl or greater: testing. C. If the result meets the [name] definition of a critical result, follow [name] policy. *Juice or candies are NOT equivalent products. If patient refuses the Glucerna or Ensure, please consult the [name] dietitian for an appropriate substitute. Electronic Signature(s) Signed: 01/26/2023 12:17:23 PM By: [name] MD FACS Entered By: [name] on 01/26/2023 11:44:28 -------------------------------------------------------------------------------- Problem List Details Patient Name: Date of Service: [facility], [name] 01/26/2023 10:30 A M Medical Record Number: [id number] Patient  Account Number: [id number] Date of Birth/Sex: Treating RN: [birth year] (72 y.o. [name] Primary Care Provider: [name] Other Other Clinician: Referring Provider: Treating Provider/Extender: [name] Weeks in Treatment: 15 Active Problems ICD-10 Encounter Code Description Active Date MDM Diagnosis L97.514 Non-pressure chronic ulcer of other 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 10/12/2022 No Yes E11.621 Type 2 diabetes mellitus with foot ulcer 10/12/2022 No Yes N18.5 Chronic kidney disease, stage 5 10/12/2022 No Yes M86.9 Osteomyelitis, unspecified 10/12/2022 No Yes E11.42 Type 2 diabetes mellitus with diabetic polyneuropathy 10/12/2022 No Yes I10 Essential (primary) hypertension 10/12/2022 No  Yes Inactive Problems Resolved Problems Electronic Signature(s) Signed: 01/26/2023 11:40:53 AM By: [name] MD FACS [place], [name] ([id number]) [id number] Page 6 of 11 Entered By: [name] on 01/26/2023 11:40:52 -------------------------------------------------------------------------------- Progress Note Details Patient Name: Date of Service: [name], [name] 01/26/2023 10:30 A M Medical Record Number: [id number] Patient Account Number: [id number] Date of Birth/Sex: Treating RN: Jul 04, 1951 (72 y.o. M) Primary Care Provider: [name] Other Other Clinician: Referring Provider: Treating Provider/Extender: [name] Weeks in Treatment: 15 Subjective Chief Complaint Information obtained from Patient Patients presents for treatment of an open diabetic ulcer on the right great toe History of Present Illness (HPI) ADMISSION 10/12/2022 This is a 72 year old type II diabetic (last hemoglobin A1c 7.7 in October 2023) with stage V chronic kidney disease and hypertension. He has been followed by podiatry for an ulcer on his right great toe. He has a history of prior toe and foot infections requiring amputation. In  November 2023, he had an outpatient MRI that demonstrated osteomyelitis. He was admitted to the [name] for IV antibiotics and subsequently underwent irrigation and debridement of the toe with biopsy and placement of married 3 layer matrix. Cultures grew out MRSA and osteomyelitis was seen on pathology. He was followed by infectious disease and given a course of doxycycline and Augmentin. He completed this course at the end of December. He has been evaluated by vascular surgery with formal ABIs as well as consultation. He does have adequate blood flow at least to the dorsalis pedis. He last saw podiatry on January 4 of this year where he underwent debridement. It was felt he would benefit from specialized wound care and he was referred to our center for further evaluation and management. On the dorsal aspect of his right great toe, there is a circular ulcer. There is a fair amount of undermining from callus and dry skin. The surface is a bit pale and fibrotic, but there is no obvious necrosis. 10/19/2022: He has been approved by his insurance to undergo hyperbaric oxygen therapy. EKG has been obtained and is normal. We are still waiting on his chest x-ray. The wound is clean with just a little bit of periwound callus and slough on the surface. Tendon is exposed. 10/26/2022: Unfortunately, the required co-pay from his insurance is cost prohibitive for him and he will not be able to receive hyperbaric oxygen therapy. Some moisture got under the dry skin around his wound and lifted it off but there is good epithelialized tissue beneath this. The exposed tendon has deteriorated. 11/03/2022: After some discussion with the financial office, it has become feasible for the patient to undergo hyperbaric oxygen therapy and he has received his first treatments this week. The wound is a little bit smaller and the tissue has a better color to it, but there is still extensive nonviable subcutaneous tissue and slough  present. 11/09/2022: The wound measured a little bit smaller today. There is still slough and nonviable subcu tissue and tendon present. 11/16/2022: The wound continues to contract. There is still substantial depth to it, however, and bone remains exposed at the base. This is also a (early) 30-day update for HBO. 11/27/2022: Unfortunately, the wound has deteriorated. It appears that the tissue has become macerated. There is a second ulcer on the lateral aspect of his great toe. The nail is lifting off of the bed and there is wet skin lifting off of the underlying tissue. The initial wound is smaller but still has a fair amount of depth to  it. The patient does report quite a bit more drainage since his last visit. 12/20/2022: For scheduling reasons, somehow we have missed having a wound care visit in between his hyperbaric oxygen therapy treatments. The 2 openings of the wound have converged into 1 large site. There is necrotic tendon present at the lateral aspect of the great toe. The bone is visible, including the joint space. The area of the initial wound is flush with the surrounding skin. No malodor or purulent drainage. 12/28/2022: The wound is vastly improved this week. The 2 areas of the wound have separated due to tissue bridging and healing the space between. There is no more necrotic tissue visible, although the bone/joint space remains exposed. 01/04/2023: His wound continues to improve. The more medial aspect of the toe is nearly closed. Bone remains exposed laterally. He has been approved for Epicord. He is going to start dialysis next week. 01/11/2023: The medial aspect of the wound is closed completely. The lateral open portion of his wound has contracted and is shallower. There is still bone exposure. He has started dialysis. We are going to apply Epicord today. 01/19/2023: There is good granulation tissue starting to fill in his wound. Bone remains exposed, however. 01/26/2023: The wound is  smaller again today. The granulation tissue is a bit hypertrophic. There is still an area of bone that remains exposed. Patient History Information obtained from Patient. Family History Diabetes - Mother,Siblings, Heart Disease - Siblings, Hypertension - Siblings, Seizures - Father, Stroke - Siblings, No family history of Cancer, Hereditary Spherocytosis, Kidney Disease, Lung Disease, Thyroid Problems, Tuberculosis. Social History Former smoker, Marital Status - Married, Alcohol Use - Rarely, Drug Use - No History, Caffeine Use - Daily. Medical History Eyes Patient has history of Cataracts - rt eye Ear/Nose/Mouth/Throat Denies history of Chronic sinus problems/congestion Hematologic/Lymphatic [name], [name] ([id number]) [id number] Page 7 of 11 Denies history of Anemia, Hemophilia, Human Immunodeficiency Virus, Lymphedema, Sickle Cell Disease Respiratory Denies history of Aspiration, Asthma, Chronic Obstructive Pulmonary Disease (COPD), Pneumothorax, Tuberculosis Cardiovascular Patient has history of Hypertension Gastrointestinal Denies history of Cirrhosis , Colitis, Crohnoos, Hepatitis A, Hepatitis B, Hepatitis C Endocrine Patient has history of Type II Diabetes Genitourinary Denies history of End Stage Renal Disease Immunological Denies history of Lupus Erythematosus, Raynaudoos, Scleroderma Integumentary (Skin) Denies history of History of Burn Musculoskeletal Patient has history of Osteomyelitis - MRI confirmed (08/16/22) Denies history of Gout Neurologic Patient has history of Neuropathy Psychiatric Patient has history of Confinement Anxiety Denies history of Anorexia/bulimia Hospitalization/Surgery History - AV fistula place left upper arm 10/20/22. Medical A Surgical History Notes nd Genitourinary CKD IV Objective Constitutional Hypertensive, asymptomatic. Slightly tachycardic. no acute distress. Vitals Time Taken: 10:53 AM, Height: 72  in, Weight: 185 lbs, BMI: 25.1, Temperature: 97.9 F, Pulse: 101 bpm, Respiratory Rate: 18 breaths/min, Blood Pressure: 171/68 mmHg, Capillary Blood Glucose: 205 mg/dl. General Notes: glucose per pt report this am Respiratory Normal work of breathing on room air. General Notes: 01/26/2023: The wound is smaller again today. The granulation tissue is a bit hypertrophic. There is still an area of bone that remains exposed. Integumentary (Hair, Skin) Wound #1 status is Open. Original cause of wound was Pressure Injury. The date acquired was: 08/16/2022. The wound has been in treatment 15 weeks. The wound is located on the Right,Medial T Great. The wound measures 0.7cm length x 0.7cm width x 0.3cm depth; 0.385cm^2 area and 0.115cm^3 volume. There oe is bone, joint, tendon, and Fat Layer (Subcutaneous Tissue) exposed. There is a  medium amount of serosanguineous drainage noted. The wound margin is distinct with the outline attached to the wound base. There is large (67-100%) red granulation within the wound bed. There is a small (1-33%) amount of necrotic tissue within the wound bed including Adherent Slough. The periwound skin appearance had no abnormalities noted for texture. The periwound skin appearance had no abnormalities noted for moisture. The periwound skin appearance had no abnormalities noted for color. Periwound temperature was noted as No Abnormality. 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 A skin graft procedure  using a oe bioengineered skin substitute/cellular or tissue based product was performed by [name], MD with the following instrument(s): Forceps and Scissors. Epicord was applied and secured with Steri-Strips. 1 sq cm of product was utilized and 5 sq cm was wasted due to wound size. Post Application, adaptic, gauze was applied. A Time Out was conducted at 11:20, prior to the start of the procedure. The procedure was tolerated well with a pain level of 0 throughout and a pain level of 0 following the procedure. Post procedure Diagnosis Wound #1: Same as Pre-Procedure . Pre-procedure diagnosis of Wound #1 is a Diabetic Wound/Ulcer of the Lower Extremity located on the Right,Medial T Great . An Chemical Cauterization oe procedure was performed by [name], MD. Post procedure Diagnosis Wound #1: Same as Pre-Procedure Notes: using silver nitrate stick Plan Follow-up Appointments: Return Appointment in 1 week. - Dr. Lady Gary Rm 2 Friday 5/3 @ 11:30 am Friday 5/10 @ 10:30 RM 1 prior to HBO Anesthetic: Wound #1 Right,Medial T Great: oe (In clinic) Topical Lidocaine 4% applied to wound bed Cellular or Tissue Based Products: Cellular or Tissue Based Product Type: - epicord #3 Cellular or Tissue Based Product applied to wound bed, secured with steri-strips, cover with Adaptic or Mepitel. (DO NOT REMOVE). Bathing/ Shower/ Hygiene: May shower with protection but do not get wound dressing(s) wet. Protect dressing(s) with water repellant cover (for example, large plastic bag) or a cast cover and may then take shower. Off-Loading: Wound #1 Right,Medial T Great: oe Other: - Diabetic shoe Hyperbaric Oxygen Therapy: Evaluate for HBO Therapy Indication: - chronic refractory osteomyelitis If appropriate for treatment, begin HBOT per protocol: 2.5 ATA for 90 Minutes with 2 Five (5) Minute Air Breaks T Number of Treatments: - 40 plus 40 (12/20/22) otal One treatments per day (delivered  Monday through Friday unless otherwise specified in Special Instructions below): Finger stick Blood Glucose Pre- and Post- HBOT Treatment. Follow Hyperbaric Oxygen Glycemia Protocol Give two 4oz orange juices in addition to Glucerna when the glycemic protocol is used. Afrin (Oxymetazoline HCL) 0.05% nasal spray - 1 spray in both nostrils daily as needed prior to HBO treatment for difficulty clearing ears WOUND #1: - T Great Wound Laterality: Right, Medial oe Cleanser: Soap and Water 1 x Per Week/30 Days Discharge Instructions: May shower and wash wound with dial antibacterial soap and water prior to dressing change. Cleanser: Wound Cleanser 1 x Per Week/30 Days Discharge Instructions: Cleanse the wound with wound cleanser prior to applying a clean dressing using gauze sponges, not tissue or cotton balls. Prim Dressing: Epicord  1 x Per Week/30 Days ary Secondary Dressing: ADAPTIC TOUCH 3x4.25 in 1 x Per Week/30 Days Discharge Instructions: Apply over primary dressing as directed. Secondary Dressing: Woven Gauze Sponge, Non-Sterile 4x4 in 1 x Per Week/30 Days Discharge Instructions: Apply over primary dressing as directed. Secured With: Insurance underwriter, Sterile 2x75 (in/in) 1 x Per Week/30 Days Discharge Instructions: Secure with stretch gauze as directed. Secured With: 2M Medipore Scientist, research (life sciences) Surgical T 2x10 (in/yd) 1 x Per Week/30 Days ape Discharge Instructions: Secure with tape as directed. 01/26/2023: The wound is smaller again today. The granulation tissue is a bit hypertrophic. There is still an area of bone that remains exposed. No debridement was necessary today. I used silver nitrate to chemically cauterize the hypertrophic granulation tissue. Epicort was done on rehydrated with normal saline. An appropriate sized piece was cut and applied to his wound. It was secured in situ with Adaptic and Steri-Strips. The wound was then dressed. He will continue hyperbaric oxygen  therapy. Follow-up in 1 week. Electronic Signature(s) Signed: 01/26/2023 11:45:17 AM By: [name] MD FACS Entered By: [name] on 01/26/2023 11:45:17 -------------------------------------------------------------------------------- HxROS Details Patient Name: Date of Service: [facility] [name], [name] 01/26/2023 10:30 A M Medical Record Number: [id number] Patient Account Number: [id number] Date of Birth/Sex: Treating RN: September 25, 1951 (72 y.o. M) Primary Care Provider: [name] Other Other Clinician: Referring Provider: Treating Provider/Extender: Gracy Racer in Treatment: [address], [name] ([id number]) [id number] Page 9 of 11 Information Obtained From Patient Eyes Medical History: Positive for: Cataracts - rt eye Ear/Nose/Mouth/Throat Medical History: Negative for: Chronic sinus problems/congestion Hematologic/Lymphatic Medical History: Negative for: Anemia; Hemophilia; Human Immunodeficiency Virus; Lymphedema; Sickle Cell Disease Respiratory Medical History: Negative for: Aspiration; Asthma; Chronic Obstructive Pulmonary Disease (COPD); Pneumothorax; Tuberculosis Cardiovascular Medical History: Positive for: Hypertension Gastrointestinal Medical History: Negative for: Cirrhosis ; Colitis; Crohns; Hepatitis A; Hepatitis B; Hepatitis C Endocrine Medical History: Positive for: Type II Diabetes Time with diabetes: 2000 Treated with: Insulin Blood sugar tested every day: Yes Tested : Genitourinary Medical History: Negative for: End Stage Renal Disease Past Medical History Notes: CKD IV Immunological Medical History: Negative for: Lupus Erythematosus; Raynauds; Scleroderma Integumentary (Skin) Medical History: Negative for: History of Burn Musculoskeletal Medical History: Positive for: Osteomyelitis - MRI confirmed (08/16/22) Negative for: Gout Neurologic Medical History: Positive for:  Neuropathy Psychiatric Medical History: Positive for: Confinement Anxiety Negative for: Anorexia/bulimia HBO Extended History Items Eyes: CHUCKIE, MCCATHERN ([id number]) [id number] Page 10 of 11 Cataracts Immunizations Pneumococcal Vaccine: Received Pneumococcal Vaccination: Yes Received Pneumococcal Vaccination On or After 60th Birthday: Yes Implantable Devices None Hospitalization / Surgery History Type of Hospitalization/Surgery AV fistula place left upper arm 10/20/22 Family and Social History Cancer: No; Diabetes: Yes - Mother,Siblings; Heart Disease: Yes - Siblings; Hereditary Spherocytosis: No; Hypertension: Yes - Siblings; Kidney Disease: No; Lung Disease: No; Seizures: Yes - Father; Stroke: Yes - Siblings; Thyroid Problems: No; Tuberculosis: No; Former smoker; Marital Status - Married; Alcohol Use: Rarely; Drug Use: No History; Caffeine Use: Daily; Financial Concerns: No; Food, Clothing or Shelter Needs: No; Support System Lacking: No; Transportation Concerns: No Electronic Signature(s) Signed: 01/26/2023 12:17:23 PM By: [name] MD FACS Entered By: [name] on 01/26/2023 11:43:43 -------------------------------------------------------------------------------- SuperBill Details Patient Name: Date of Service: [facility], [name] 01/26/2023 Medical Record Number: [id number] Patient Account Number: [id number] Date of Birth/Sex: Treating RN: Jun 08, 1951 (72 y.o. M) Primary Care Provider: [name] Other Other Clinician: Referring Provider: Treating Provider/Extender: [name] Weeks in Treatment: 15 Diagnosis Coding ICD-10 Codes  Code Description L97.514 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.621 Type 2 diabetes mellitus with foot ulcer N18.5 Chronic kidney disease, stage 5 M86.9 Osteomyelitis, unspecified E11.42 Type 2 diabetes mellitus with diabetic polyneuropathy I10 Essential  (primary) hypertension Facility Procedures : CPT4 Code: 16109604 Description: Q4187 Epicord 2cm x 3cm - per sqcm Modifier: Quantity: 6 : CPT4 Code: 54098119 Description: 15275 - SKIN SUB GRAFT FACE/NK/HF/G ICD-10 Diagnosis Description L97.514 Non-pressure chronic ulcer of other 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 Modifier: Quantity: 1 Physician Procedures : CPT4 Code Description Modifier [id number] 99213 - WC PHYS LEVEL 3 - EST PT 25 ICD-10 Diagnosis Description L97.514 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.621 Type 2 diabetes mellitus with foot ulcer M86.9  Osteomyelitis, unspecified E11.42 Type 2 diabetes mellitus with diabetic polyneuropathy Quantity: 1 : [id number] 15275 - WC PHYS SKIN SUB GRAFT FACE/NK/HF/G ICD-10 Diagnosis Description L97.514 Non-pressure chronic ulcer of other 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 Lapaglia, Alwin ([id number]) [id number] Quantity: 1 Page 11 of 11 Electronic Signature(s) Signed: 01/26/2023 11:45:39 AM By: [name] MD FACS Entered By: [name] on 01/26/2023 11:45:39

## 2023-01-27 NOTE — Progress Notes (Signed)
[name], [name] ([id number]) [id number] Page 1 of 2 Visit Report for 01/26/2023 HBO Details Patient Name: Date of Service: [name], [name] 01/26/2023 12:00 PM Medical Record Number: [id number] Patient Account Number: [id number] Date of Birth/Sex: Treating RN: [birth year] (72 y.o. 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 HBO Treatment Details Treatment Number: 48 Patient Type: Outpatient Chamber Type: Monoplace Chamber Serial #: [id number] Treatment Protocol: 2.5 ATA with 90 minutes oxygen, with two 5 minute air breaks Treatment Details Compression Rate Down: 1.5 psi / minute De-Compression Rate Up: A breaks and breathing ir Compress Tx Pressure periods Decompress Decompress Begins Reached (leave unused spaces Begins Ends blank) Chamber Pressure (ATA 1 2.5 2.5 2.5 2.5 2.5 - - 2.5 1 ) Clock Time (24 hr) 12:03 12:17 12:48 12:53 13:23 13:28 - - 13:58 14:07 Treatment Length: 124 (minutes) Treatment Segments: 4 Vital Signs Capillary Blood Glucose Reference Range: 80 - 120 mg / dl HBO Diabetic Blood Glucose Intervention Range: <131 mg/dl or >324 mg/dl Type: Time Vitals Blood Respiratory Capillary Blood Glucose Pulse Action Pulse: Temperature: Taken: Pressure: Rate: Glucose (mg/dl): Meter #: Oximetry (%) Taken: Pre 10:57 171/68 101 18 97.9 205 Pre 11:57 100 Heart rate recheck Post 14:14 144/55 82 18 97.2 110 Treatment Response Treatment Toleration: Well Treatment Completion Status: Treatment Completed without Adverse Event Physician HBO Attestation: I certify that I supervised this HBO  treatment in accordance with Medicare guidelines. A trained emergency response team is readily available per Yes hospital policies and procedures. Continue HBOT as ordered. Yes Electronic Signature(s) Signed: 01/26/2023 4:48:08 PM By: [name] MD FACS Previous Signature: 01/26/2023 2:52:50 PM Version By: [name] EMT Previous Signature: 01/26/2023 12:[name]:47 PM Version By: [name] EMT Entered By: [name] on 01/26/2023 16:48:08 -------------------------------------------------------------------------------- HBO Safety Checklist Details Patient Name: Date of Service: [name], [name] 01/26/2023 12:00 PM Medical Record Number: [id number] Patient Account Number: [id number] Date of Birth/Sex: Treating RN: [name], [name] (72 y.o. [name] Primary Care Anneka Studer: [name] Other Other Clinician: [name] Referring Takyra Cantrall: Treating Tracer Gutridge/Extender: [name] Weeks in Treatment: 15 [name], [name] ([id number]) [id number] Page 2 of 2 HBO Safety Checklist Items Safety Checklist Consent Form Signed Patient voided / foley secured and emptied When did you last eato 1000 Last dose of injectable or oral agent this morning Ostomy pouch emptied and vented if applicable NA All implantable devices assessed, documented and approved Dialysis shunt Intravenous access site secured and place NA Valuables secured Linens and cotton and cotton/polyester blend (less than 51% polyester) Personal oil-based products / skin lotions / body lotions removed Wigs or hairpieces removed NA Smoking or tobacco materials removed Books / newspapers / magazines / loose paper removed Cologne, aftershave, perfume and deodorant removed Jewelry removed (may wrap wedding band) Make-up removed NA Hair care products removed Battery operated devices (external) removed Heating patches and chemical warmers removed Titanium eyewear removed NA Nail  polish cured greater than 10 hours NA Casting material cured greater than 10 hours NA Hearing aids removed NA Loose dentures or partials removed removed by patient Prosthetics have been removed NA Patient demonstrates correct use of air break device (if applicable) Patient concerns have been addressed Patient grounding bracelet on and cord attached to chamber  Specifics for Inpatients (complete in addition to above) Medication sheet sent with patient NA Intravenous medications needed or due during therapy sent with patient NA Drainage tubes (e.g. nasogastric tube or chest tube secured and vented) NA Endotracheal or Tracheotomy tube secured NA Cuff deflated of air and inflated with saline NA Airway suctioned NA Notes The safety checklist was done before the treatment was started. Electronic Signature(s) Signed: 01/26/2023 12:15:52 PM By: [name] EMT Entered By: [name] on 01/26/2023 12:15:52

## 2023-01-27 NOTE — Progress Notes (Signed)
ANTWIONE, PICOTTE ([id number]) [id number] Page 1 of 2 Visit Report for 01/26/2023 Problem List Details Patient Name: Date of Service: [name], [name] 01/26/2023 12:00 PM Medical Record Number: [id number] Patient Account Number: [id number] Date of Birth/Sex: Treating RN: [birth year] (72 y.o. [name] Primary Care Provider: [name] Other Other Clinician: [name] Referring Provider: Treating Provider/Extender: [name] Weeks in Treatment: 15 Active Problems ICD-10 Encounter Code Description Active Date MDM Diagnosis L97.514 Non-pressure chronic ulcer of other part of right foot with 10/12/2022 No Yes necrosis of bone E11.621 Type 2 diabetes mellitus with foot ulcer 10/12/2022 No Yes N18.5 Chronic kidney disease, stage 5 10/12/2022 No Yes M86.9 Osteomyelitis, unspecified 10/12/2022 No Yes E11.42 Type 2 diabetes mellitus with diabetic polyneuropathy 10/12/2022 No Yes I10 Essential (primary) hypertension 10/12/2022 No Yes Inactive Problems Resolved Problems Electronic Signature(s) Signed: 01/26/2023 2:53:29 PM By: [name] EMT Signed: 01/26/2023 4:46:59 PM By: [name] MD FACS Entered By: [name] on 01/26/2023 14:53:29 -------------------------------------------------------------------------------- SuperBill Details Patient Name: Date of Service: [name], [name] 01/26/2023 Medical Record Number: [id number] Patient Account Number: [id number] Date of Birth/Sex: Treating RN: [birth year] (72 y.o. [name] Primary Care Provider: [name] Other Other Clinician: [name] Referring Provider: Treating Provider/Extender: [name] Weeks in Treatment: 15 Diagnosis Coding ICD-10 Codes Code Description [phone] 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.621 Type 2 diabetes mellitus with foot ulcer [name], [name] ([id number])  [id number] Page 2 of 2 N18.5 Chronic kidney disease, stage 5 M86.9 Osteomyelitis, unspecified E11.42 Type 2 diabetes mellitus with diabetic polyneuropathy I10 Essential (primary) hypertension Facility Procedures : CPT4 Code Description: [id number] G0277-(Facility Use Only) HBOT full body chamber, , ICD-10 Diagnosis Description M86.9 Osteomyelitis, unspecified L97.514 Non-pressure chronic ulcer of other part of right foo E11.42 Type 2 diabetes mellitus with  diabetic polyneuropathy E11.621 Type 2 diabetes mellitus with foot ulcer Modifier: t with necrosis Quantity: 4 of bone Physician Procedures : CPT4 Code Description Modifier [id number] 99183 - WC PHYS HYPERBARIC OXYGEN THERAPY ICD-10 Diagnosis Description M86.9 Osteomyelitis, unspecified L97.514 Non-pressure chronic ulcer of other part of right foot with necrosis o E11.42 Type 2 diabetes  mellitus with diabetic polyneuropathy E11.621 Type 2 diabetes mellitus with foot ulcer Quantity: 1 f bone Electronic Signature(s) Signed: 01/26/2023 2:53:25 PM By: [name] EMT Signed: 01/26/2023 4:46:59 PM By: [name] MD FACS Entered By: [name] on 01/26/2023 14:53:24

## 2023-01-29 ENCOUNTER — Encounter (HOSPITAL_BASED_OUTPATIENT_CLINIC_OR_DEPARTMENT_OTHER): Payer: No Typology Code available for payment source | Admitting: General Surgery

## 2023-01-29 DIAGNOSIS — M86671 Other chronic osteomyelitis, right ankle and foot: Secondary | ICD-10-CM | POA: Diagnosis not present

## 2023-01-29 DIAGNOSIS — E11621 Type 2 diabetes mellitus with foot ulcer: Secondary | ICD-10-CM | POA: Diagnosis not present

## 2023-01-29 LAB — GLUCOSE, CAPILLARY
Glucose-Capillary: 290 mg/dL — ABNORMAL HIGH (ref 70–99)
Glucose-Capillary: 276 mg/dL — ABNORMAL HIGH (ref 70–99)

## 2023-01-29 NOTE — Progress Notes (Signed)
[place], Tare[place] ([id number]) [id number] Page 1 of 2 Visit Report for 01/29/2023 Arrival Information Details Patient Name: Date of Service: [place], [name] 01/29/2023 12:00 PM Medical Record Number: [id number] Patient Account Number: [id number] Date of Birth/Sex: Treating RN: 04/05/1951 (72 y.o. [name], Millard.Loa Primary Care Tymeir Weathington: [name] Other Other Clinician: [name] Referring Lorynn Moeser: Treating Carr Shartzer/Extender: [name] Weeks in Treatment: 15 Visit Information History Since Last Visit All ordered tests and consults were completed: Yes Patient Arrived: Cane Added or deleted any medications: No Arrival Time: 11:15 Any new allergies or adverse reactions: No Accompanied By: None Had a fall or experienced change in No Transfer Assistance: None activities of daily living that may affect Patient Identification Verified: Yes risk of falls: Secondary Verification Process Completed: Yes Signs or symptoms of abuse/neglect since last visito No Patient Requires Transmission-Based Precautions: No Hospitalized since last visit: No Patient Has Alerts: No Implantable device outside of the clinic excluding No cellular tissue based products placed in the center since last visit: Pain Present Now: No Electronic Signature(s) Signed: 01/29/2023 12:16:48 PM By: [name] EMT Entered By: [name] on 01/29/2023 12:16:47 -------------------------------------------------------------------------------- Encounter Discharge Information Details Patient Name: Date of Service: [facility] [name], [name] 01/29/2023 12:00 PM Medical Record Number: [id number] Patient Account Number: [id number] Date of Birth/Sex: Treating RN: [birth year] (72 y.o. [name] Primary Care Quetzally Callas: [name] Other Other Clinician: [name] Referring Teretha Chalupa: Treating Arkin Imran/Extender: [name] Weeks in Treatment: 15 Encounter Discharge  Information Items Discharge Condition: Stable Ambulatory Status: Cane Discharge Destination: Home Transportation: Private Auto Accompanied By: None Schedule Follow-up Appointment: Yes Clinical Summary of Care: Electronic Signature(s) Signed: 01/29/2023 2:36:54 PM By: [name] EMT Entered By: [name] on 01/29/2023 14:36:54 -------------------------------------------------------------------------------- Vitals Details Patient Name: Date of Service: [facility] [name], [name] 01/29/2023 12:00 PM Medical Record Number: [id number] Patient Account Number: [id number] Date of Birth/Sex: Treating RN: 07-19-1951 (72 y.o. [name] Primary Care Vannie Hilgert: [name] Other Other Clinician: [name] Referring Yakov Bergen: Treating Robin Petrakis/Extender: [name] Weeks in Treatment: 15 Vital Signs Time Taken: 11:24 Temperature (F): 98.6 [place], Caydan ([id number]) [id number] Page 2 of 2 Height (in): 72 Pulse (bpm): 89 Weight (lbs): 185 Respiratory Rate (breaths/min): 18 Body Mass Index (BMI): 25.1 Blood Pressure (mmHg): 150/61 Capillary Blood Glucose (mg/dl): 433 Reference Range: 80 - 120 mg / dl Electronic Signature(s) Signed: 01/29/2023 12:17:20 PM By: [name] EMT Entered By: [name] on 01/29/2023 12:17:20

## 2023-01-29 NOTE — Progress Notes (Signed)
[place], Ilario ([id number]) [id number] Page 1 of 2 Visit Report for 01/29/2023 HBO Details Patient Name: Date of Service: [name], [name] [name] 01/29/2023 12:00 PM Medical Record Number: [id number] Patient Account Number: [id number] Date of Birth/Sex: Treating RN: 07-Jan-1951 (72 y.o. 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 HBO Treatment Details Treatment Number: 49 Patient Type: Outpatient Chamber Type: Monoplace Chamber Serial #: [id number] Treatment Protocol: 2.5 ATA with 90 minutes oxygen, with two 5 minute air breaks Treatment Details Compression Rate Down: 2.0 psi / minute De-Compression Rate Up: 2.0 psi / minute A breaks and breathing ir Compress Tx Pressure periods Decompress Decompress Begins Reached (leave unused spaces Begins Ends blank) Chamber Pressure (ATA 1 2.5 2.5 2.5 2.5 2.5 - - 2.5 1 ) Clock Time (24 hr) 11:44 11:58 12:28 12:33 13:03 13:08 - - 13:38 13:48 Treatment Length: 124 (minutes) Treatment Segments: 4 Vital Signs Capillary Blood Glucose Reference Range: 80 - 120 mg / dl HBO Diabetic Blood Glucose Intervention Range: <131 mg/dl or >782 mg/dl Type: Time Vitals Blood Pulse: Respiratory Temperature: Capillary Blood Glucose Pulse Action Taken: Pressure: Rate: Glucose (mg/dl): Meter #: Oximetry (%) Taken: Pre 11:24 150/61 89 18 98.6 290 Dr. Lady Gary informed of blood sugar Post 13:53 152/59 89 18 97.9 276 Dr. Lady Gary informed of blood sugar Treatment Response Treatment Toleration: Well Treatment Completion Status: Treatment Completed without Adverse Event Treatment  Notes The patient stated that he had a sweet tea before coming in for treatment today. Physician HBO Attestation: I certify that I supervised this HBO treatment in accordance with Medicare guidelines. A trained emergency response team is readily available per Yes hospital policies and procedures. Continue HBOT as ordered. Yes Electronic Signature(s) Signed: 01/29/2023 4:06:07 PM By: [name] MD FACS Previous Signature: 01/29/2023 2:35:16 PM Version By: [name] EMT Previous Signature: 01/29/2023 12:20:00 PM Version By: [name] EMT Entered By: [name] on 01/29/2023 16:06:07 -------------------------------------------------------------------------------- HBO Safety Checklist Details Patient Name: Date of Service: [facility], [name] 01/29/2023 12:00 PM Medical Record Number: [id number] Patient Account Number: [id number] Date of Birth/Sex: Treating RN: [birth year] (72 y.o. [name] Primary Care Sheriden Archibeque: [name] Other Other Clinician: [name] Referring Gaynelle Pastrana: Treating Athen Riel/Extender: [name] [name], [name] ([id number]) [id number] Page 2 of 2 Weeks in Treatment: 15 HBO Safety Checklist Items Safety Checklist Consent Form Signed Patient voided / foley secured and emptied When did you last eato 1000 Last dose of injectable or oral agent Yesterday Ostomy pouch emptied and vented if applicable NA All implantable devices assessed, documented and approved NA Intravenous access site secured and place NA Valuables secured Linens and cotton and cotton/polyester blend (less than 51% polyester) Personal oil-based products / skin lotions / body lotions removed Wigs or hairpieces removed NA Smoking or tobacco materials removed Books / newspapers / magazines / loose paper removed Cologne, aftershave, perfume and deodorant removed Jewelry removed (may wrap wedding band) Make-up removed NA Hair care  products removed Battery operated devices (external) removed Heating patches and chemical warmers removed Titanium eyewear removed NA Nail polish cured greater than 10 hours NA Casting material cured greater than 10 hours NA Hearing aids removed NA Loose dentures or partials removed removed by patient Prosthetics have been removed  NA Patient demonstrates correct use of air break device (if applicable) Patient concerns have been addressed Patient grounding bracelet on and cord attached to chamber Specifics for Inpatients (complete in addition to above) Medication sheet sent with patient NA Intravenous medications needed or due during therapy sent with patient NA Drainage tubes (e.g. nasogastric tube or chest tube secured and vented) NA Endotracheal or Tracheotomy tube secured NA Cuff deflated of air and inflated with saline NA Airway suctioned NA Notes The safety checklist was done before the treatment was started. Electronic Signature(s) Signed: 01/29/2023 12:18:33 PM By: [name] EMT Entered By: [name] on 01/29/2023 12:18:33

## 2023-01-30 ENCOUNTER — Encounter (HOSPITAL_BASED_OUTPATIENT_CLINIC_OR_DEPARTMENT_OTHER): Payer: No Typology Code available for payment source | Admitting: General Surgery

## 2023-01-30 DIAGNOSIS — E11621 Type 2 diabetes mellitus with foot ulcer: Secondary | ICD-10-CM | POA: Diagnosis not present

## 2023-01-30 DIAGNOSIS — Z992 Dependence on renal dialysis: Secondary | ICD-10-CM | POA: Diagnosis not present

## 2023-01-30 DIAGNOSIS — M86671 Other chronic osteomyelitis, right ankle and foot: Secondary | ICD-10-CM | POA: Diagnosis not present

## 2023-01-30 DIAGNOSIS — N186 End stage renal disease: Secondary | ICD-10-CM | POA: Diagnosis not present

## 2023-01-30 LAB — GLUCOSE, CAPILLARY
Glucose-Capillary: 117 mg/dL — ABNORMAL HIGH (ref 70–99)
Glucose-Capillary: 93 mg/dL (ref 70–99)
Glucose-Capillary: 120 mg/dL — ABNORMAL HIGH (ref 70–99)

## 2023-01-30 NOTE — Progress Notes (Signed)
[place], Cohan ([id number]) [id number] Page 1 of 2 Visit Report for 01/29/2023 Problem List Details Patient Name: Date of Service: [name], [name] [name] 01/29/2023 12:00 PM Medical Record Number: [id number] Patient Account Number: [id number] Date of Birth/Sex: Treating RN: 1951-01-06 (72 y.o. 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 10/12/2022 No Yes N18.5 Chronic kidney disease, stage 5 10/12/2022 No Yes M86.9 Osteomyelitis, unspecified 10/12/2022 No Yes E11.42 Type 2 diabetes mellitus with diabetic polyneuropathy 10/12/2022 No Yes I10 Essential (primary) hypertension 10/12/2022 No Yes Inactive Problems Resolved Problems Electronic Signature(s) Signed: 01/29/2023 2:35:56 PM By: [name] EMT Signed: 01/29/2023 4:00:45 PM By: [name] MD FACS Entered By: [name] on 01/29/2023 14:35:56 -------------------------------------------------------------------------------- SuperBill Details Patient Name: Date of Service: [name], [name] 01/29/2023 Medical Record Number: [id number] Patient Account Number: [id number] Date of Birth/Sex: Treating RN: [birth year] (72 y.o. 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 [name], [name] ([id number])  [phone].pdf Page 2 of 2 N18.5 Chronic kidney disease, stage 5 M86.9 Osteomyelitis, unspecified E11.42 Type 2 diabetes mellitus with diabetic polyneuropathy I10 Essential (primary) hypertension Facility Procedures : CPT4 Code: [id number] Description: G0277-(Facility Use Only) HBOT full body chamber, , ICD-10 Diagnosis Description M86.9 Osteomyelitis, unspecified 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

## 2023-01-31 ENCOUNTER — Encounter (HOSPITAL_BASED_OUTPATIENT_CLINIC_OR_DEPARTMENT_OTHER): Payer: Medicare PPO | Admitting: General Surgery

## 2023-01-31 DIAGNOSIS — Z992 Dependence on renal dialysis: Secondary | ICD-10-CM | POA: Diagnosis not present

## 2023-01-31 DIAGNOSIS — N186 End stage renal disease: Secondary | ICD-10-CM | POA: Diagnosis not present

## 2023-01-31 NOTE — Progress Notes (Signed)
[place], Jamone ([id number]) [id number] Page 1 of 2 Visit Report for 01/30/2023 Problem List Details Patient Name: Date of Service: [name], [name] 01/30/2023 12:00 PM Medical Record Number: [id number] Patient Account Number: [id number] Date of Birth/Sex: Treating RN: [birth year] (72 y.o. 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.621 Type 2 diabetes mellitus with foot ulcer 10/12/2022 No Yes N18.5 Chronic kidney disease, stage 5 10/12/2022 No Yes M86.9 Osteomyelitis, unspecified 10/12/2022 No Yes E11.42 Type 2 diabetes mellitus with diabetic polyneuropathy 10/12/2022 No Yes I10 Essential (primary) hypertension 10/12/2022 No Yes Inactive Problems Resolved Problems Electronic Signature(s) Signed: 01/30/2023 2:12:23 PM By: [name] EMT Signed: 01/30/2023 3:54:11 PM By: [name] MD FACS Entered By: [name] on 01/30/2023 14:12:23 -------------------------------------------------------------------------------- SuperBill Details Patient Name: Date of Service: [name], [name] 01/30/2023 Medical Record Number: [id number] Patient Account Number: [id number] Date of Birth/Sex: Treating RN: [birth year] (72 y.o. 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 [name], [name] ([id number])  [phone].pdf Page 2 of 2 N18.5 Chronic kidney disease, stage 5 M86.9 Osteomyelitis, unspecified E11.42 Type 2 diabetes mellitus with diabetic polyneuropathy I10 Essential (primary) hypertension Facility Procedures : CPT4 Code Description: [id number] G0277-(Facility Use Only) HBOT full body chamber, , ICD-10 Diagnosis Description M86.9 Osteomyelitis, unspecified L97.514 Non-pressure chronic ulcer of other part of right foo E11.42 Type 2 diabetes mellitus with  diabetic polyneuropathy E11.621 Type 2 diabetes mellitus with foot ulcer Modifier: t with necrosis Quantity: 4 of bone Physician Procedures : CPT4 Code Description Modifier [id number] - WC PHYS HYPERBARIC OXYGEN THERAPY ICD-10 Diagnosis Description M86.9 Osteomyelitis, unspecified 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

## 2023-01-31 NOTE — Progress Notes (Signed)
[place], Dagmawi ([id number]) [id number] Page 1 of 2 Visit Report for 01/30/2023 Arrival Information Details Patient Name: Date of Service: [name], [name] 01/30/2023 12:00 PM Medical Record Number: [id number] Patient Account Number: [id number] Date of Birth/Sex: Treating RN: [birth year] (72 y.o. [name], [name] Primary Care Joeangel Jeanpaul: [name] Other Other Clinician: [name] Referring Gaylord Seydel: Treating Laquentin Loudermilk/Extender: [name] Weeks in Treatment: 15 Visit Information History Since Last Visit All ordered tests and consults were completed: Yes Patient Arrived: [name] Added or deleted any medications: No Arrival Time: 10:53 Any new allergies or adverse reactions: No Accompanied By: None Had a fall or experienced change in No Transfer Assistance: None activities of daily living that may affect Patient Identification Verified: Yes risk of falls: Secondary Verification Process Completed: Yes Signs or symptoms of abuse/neglect since last visito No Patient Requires Transmission-Based Precautions: No Hospitalized since last visit: No Patient Has Alerts: No Implantable device outside of the clinic excluding No cellular tissue based products placed in the center since last visit: Pain Present Now: No Electronic Signature(s) Signed: 01/30/2023 12:04:46 PM By: [name] EMT Entered By: [name] on 01/30/2023 12:04:46 -------------------------------------------------------------------------------- Encounter Discharge Information Details Patient Name: Date of Service: [facility] [name], [name] 01/30/2023 12:00 PM Medical Record Number: [id number] Patient Account Number: [id number] Date of Birth/Sex: Treating RN: [birth year] (72 y.o. [name] Primary Care Brinae Woods: [name] Other Other Clinician: [name] Referring Nakima Fluegge: Treating Sandford Diop/Extender: [name] Weeks in Treatment: 15 Encounter Discharge  Information Items Discharge Condition: Stable Ambulatory Status: Cane Discharge Destination: Home Transportation: Private Auto Accompanied By: None Schedule Follow-up Appointment: Yes Clinical Summary of Care: Electronic Signature(s) Signed: 01/30/2023 2:13:04 PM By: [name] EMT Entered By: [name] on 01/30/2023 14:13:03 -------------------------------------------------------------------------------- Vitals Details Patient Name: Date of Service: [facility] [name], [name] 01/30/2023 12:00 PM Medical Record Number: [id number] Patient Account Number: [id number] Date of Birth/Sex: Treating RN: 11-17-1950 (72 y.o. [name] Primary Care Hien Perreira: [name] Other Other Clinician: [name] Referring Shaliah Wann: Treating Piera Downs/Extender: [name] Weeks in Treatment: 15 Vital Signs Time Taken: 11:57 Capillary Blood Glucose (mg/dl): 93 Eckardt, Sanjay ([id number]) [phone].pdf Page 2 of 2 Height (in): 72 Reference Range: 80 - 120 mg / dl Weight (lbs): 433 Body Mass Index (BMI): 25.1 Electronic Signature(s) Signed: 01/30/2023 12:05:02 PM By: [name] EMT Entered By: [name] on 01/30/2023 12:05:02

## 2023-01-31 NOTE — Progress Notes (Signed)
[name] ([id number]) [id number] Page 1 of 2 Visit Report for 01/30/2023 HBO Details Patient Name: Date of Service: [name], [name] 01/30/2023 12:00 PM Medical Record Number: [id number] Patient Account Number: [id number] Date of Birth/Sex: Treating RN: 08-23-1951 (72 y.o. 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 HBO Treatment Details Treatment Number: 50 Patient Type: Outpatient Chamber Type: Monoplace Chamber Serial #: [id number] Treatment Protocol: 2.5 ATA with 90 minutes oxygen, with two 5 minute air breaks Treatment Details Compression Rate Down: 2.0 psi / minute De-Compression Rate Up: A breaks and breathing ir Compress Tx Pressure periods Decompress Decompress Begins Reached (leave unused spaces Begins Ends blank) Chamber Pressure (ATA 1 2.5 2.5 2.5 2.5 2.5 - - 2.5 1 ) Clock Time (24 hr) 11:42 11:56 12:26 12:31 13:01 13:06 - - 13:46 13:56 Treatment Length: 134 (minutes) Treatment Segments: 4 Vital Signs Capillary Blood Glucose Reference Range: 80 - 120 mg / dl HBO Diabetic Blood Glucose Intervention Range: <131 mg/dl or >865 mg/dl Type: Time Vitals Blood Pulse: Respiratory Temperature: Capillary Blood Glucose Pulse Action Taken: Pressure: Rate: Glucose (mg/dl): Meter #: Oximetry (%) Taken: Pre 11:57 93 Patient given 8 oz glucerna Pre 11:29 117 Patient given 4 oz of orange juice Post 14:01 140/73 84 18 97.3 120 Treatment Response Treatment Toleration: Well Treatment Completion Status: Treatment Completed without Adverse Event Treatment Notes On arrival the patient  blood sugar was 93. He was given 8 oz of Glucerna to drink. 32 min later blood sugar was rechecked. Blood sugar was 117. Dr. Lady Gary informed. Dr. Lady Gary ordered 4 oz of orange juice, then start treatment. Patient given orange juice. Danny Lawless EMT HBOT Treatment went over by due to problem with timer. Physician HBO Attestation: I certify that I supervised this HBO treatment in accordance with Medicare guidelines. A trained emergency response team is readily available per Yes hospital policies and procedures. Continue HBOT as ordered. Yes Electronic Signature(s) Signed: 01/30/2023 4:01:39 PM By: [name] MD FACS Previous Signature: 01/30/2023 2:25:44 PM Version By: [name] EMT Previous Signature: 01/30/2023 2:11:23 PM Version By: [name] EMT Previous Signature: 01/30/2023 12:10:54 PM Version By: [name] EMT Entered By: [name] on 01/30/2023 16:01:39 -------------------------------------------------------------------------------- HBO Safety Checklist Details Patient Name: Date of Service: [facility] [name], [name] 01/30/2023 12:00 PM [place], [name] ([id number]) [id number] Page 2 of 2 Medical Record Number: [id number] Patient Account Number: [id number] Date of Birth/Sex: Treating RN: 1951/05/25 (72 y.o. [name] Primary Care Juliya Magill: [name] Other Other Clinician: [name] Referring Deiona Hooper: Treating Vearl Aitken/Extender: [name] Weeks in Treatment: 15 HBO Safety Checklist Items Safety Checklist Consent Form Signed Patient voided / foley secured and emptied When did you last eato 0530 Last dose of injectable or oral agent Yesterday Ostomy pouch emptied and vented if applicable NA All implantable devices assessed, documented and approved NA Intravenous access site secured and place NA Valuables secured Linens and cotton and cotton/polyester blend (less than 51% polyester) Personal oil-based  products / skin lotions / body lotions removed Wigs or hairpieces removed NA Smoking or tobacco materials removed Books / newspapers / magazines / loose paper removed Cologne, aftershave, perfume and deodorant removed Jewelry removed (may wrap wedding band) Make-up removed NA Hair  care products removed Battery operated devices (external) removed Heating patches and chemical warmers removed Titanium eyewear removed NA Nail polish cured greater than 10 hours NA Casting material cured greater than 10 hours NA Hearing aids removed NA Loose dentures or partials removed removed by patient Prosthetics have been removed NA Patient demonstrates correct use of air break device (if applicable) Patient concerns have been addressed Patient grounding bracelet on and cord attached to chamber Specifics for Inpatients (complete in addition to above) Medication sheet sent with patient NA Intravenous medications needed or due during therapy sent with patient NA Drainage tubes (e.g. nasogastric tube or chest tube secured and vented) NA Endotracheal or Tracheotomy tube secured NA Cuff deflated of air and inflated with saline NA Airway suctioned NA Notes The safety checklist was done before the treatment was started. Electronic Signature(s) Signed: 01/30/2023 12:06:05 PM By: [name] EMT Entered By: [name] on 01/30/2023 12:06:05

## 2023-02-01 ENCOUNTER — Encounter (HOSPITAL_BASED_OUTPATIENT_CLINIC_OR_DEPARTMENT_OTHER): Payer: No Typology Code available for payment source | Admitting: General Surgery

## 2023-02-01 DIAGNOSIS — Z992 Dependence on renal dialysis: Secondary | ICD-10-CM | POA: Diagnosis not present

## 2023-02-01 DIAGNOSIS — N186 End stage renal disease: Secondary | ICD-10-CM | POA: Diagnosis not present

## 2023-02-02 ENCOUNTER — Encounter (HOSPITAL_BASED_OUTPATIENT_CLINIC_OR_DEPARTMENT_OTHER): Payer: No Typology Code available for payment source | Admitting: General Surgery

## 2023-02-02 ENCOUNTER — Encounter (HOSPITAL_BASED_OUTPATIENT_CLINIC_OR_DEPARTMENT_OTHER): Payer: Medicare PPO | Attending: General Surgery | Admitting: General Surgery

## 2023-02-02 DIAGNOSIS — E11621 Type 2 diabetes mellitus with foot ulcer: Secondary | ICD-10-CM | POA: Diagnosis not present

## 2023-02-02 DIAGNOSIS — N186 End stage renal disease: Secondary | ICD-10-CM | POA: Insufficient documentation

## 2023-02-02 DIAGNOSIS — E1122 Type 2 diabetes mellitus with diabetic chronic kidney disease: Secondary | ICD-10-CM | POA: Diagnosis not present

## 2023-02-02 DIAGNOSIS — Z992 Dependence on renal dialysis: Secondary | ICD-10-CM | POA: Insufficient documentation

## 2023-02-02 DIAGNOSIS — I120 Hypertensive chronic kidney disease with stage 5 chronic kidney disease or end stage renal disease: Secondary | ICD-10-CM | POA: Diagnosis not present

## 2023-02-02 DIAGNOSIS — L97514 Non-pressure chronic ulcer of other 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: Secondary | ICD-10-CM | POA: Diagnosis not present

## 2023-02-02 DIAGNOSIS — E1142 Type 2 diabetes mellitus with diabetic polyneuropathy: Secondary | ICD-10-CM | POA: Diagnosis not present

## 2023-02-02 DIAGNOSIS — L97516 Non-pressure chronic ulcer of other part of right foot with bone involvement without evidence of necrosis: Secondary | ICD-10-CM | POA: Diagnosis not present

## 2023-02-03 DIAGNOSIS — Z992 Dependence on renal dialysis: Secondary | ICD-10-CM | POA: Diagnosis not present

## 2023-02-03 DIAGNOSIS — N186 End stage renal disease: Secondary | ICD-10-CM | POA: Diagnosis not present

## 2023-02-03 NOTE — Progress Notes (Signed)
Mer[place], Young ([id number]) [id number] Page 1 of 8 Visit Report for 02/02/2023 Arrival Information Details Patient Name: Date of Service: [name], [name] 02/02/2023 11:30 A M Medical Record Number: [id number] Patient Account Number: [id number] Date of Birth/Sex: Treating RN: 08-11-1951 (72 y.o. [name] Primary Care [name]: [name] Other Other Clinician: Referring [name]: Treating [name]/Extender: [name] Weeks in Treatment: 16 Visit Information History Since Last Visit Added or deleted any medications: No Patient Arrived: Ambulatory Any new allergies or adverse reactions: No Arrival Time: 11:36 Had a fall or experienced change in No Accompanied By: self activities of daily living that may affect Transfer Assistance: None risk of falls: Patient Identification Verified: Yes Signs or symptoms of abuse/neglect since last visito No Secondary Verification Process Completed: Yes Hospitalized since last visit: No Patient Requires Transmission-Based Precautions: No Implantable device outside of the clinic excluding No Patient Has Alerts: No cellular tissue based products placed in the center since last visit: Has Dressing in Place as Prescribed: Yes Pain Present Now: Yes Electronic Signature(s) Signed: 02/02/2023 3:21:07 PM By: [name] Entered By: [name] on 02/02/2023 11:36:58 -------------------------------------------------------------------------------- Encounter Discharge Information Details Patient Name: Date of Service: [facility], [name] 02/02/2023 11:30 A M Medical Record Number: [id number] Patient Account Number: [id number] Date of Birth/Sex: Treating RN: [birth year] (72 y.o. [name] Primary Care Makella Buckingham: [name] Other Other Clinician: Referring Lovell Roe: Treating Taleigha Pinson/Extender: [name] Weeks in Treatment: 16 Encounter Discharge Information Items Post  Procedure Vitals Discharge Condition: Stable Temperature (F): 98.5 Ambulatory Status: Ambulatory Pulse (bpm): 121 Discharge Destination: Home Respiratory Rate (breaths/min): 18 Transportation: Private Auto Blood Pressure (mmHg): 179/74 Accompanied By: self Schedule Follow-up Appointment: Yes Clinical Summary of Care: Patient Declined Electronic Signature(s) Signed: 02/02/2023 3:21:07 PM By: [name] Entered By: [name] on 02/02/2023 11:51:14 -------------------------------------------------------------------------------- Lower Extremity Assessment Details Patient Name: Date of ServiceKari [name] [name] 02/02/2023 11:30 A M Medical Record Number: [id number] Patient Account Number: [id number] Date of Birth/Sex: Treating RN: 1951/01/24 (72 y.o. [name] Primary Care Lemon Whitacre: [name] Other Other Clinician: Referring Antwan Pandya: Treating Raylene Carmickle/Extender: [name] Weeks in Treatment: 16 Edema Assessment Assessed: [Left: No] [Right: No] T[LeftHERBERTH, [name] ([id number])] [Right: [id number] Page 2 of 8] Edema: [Left: N] [Right: o] Calf Left: Right: Point of Measurement: From Medial Instep 36 cm Ankle Left: Right: Point of Measurement: From Medial Instep 22 cm Electronic Signature(s) Signed: 02/02/2023 3:21:07 PM By: [name] Entered By: [name] on 02/02/2023 11:37:27 -------------------------------------------------------------------------------- Multi Wound Chart Details Patient Name: Date of Service: [name], [name] 02/02/2023 11:30 A M Medical Record Number: [id number] Patient Account Number: [id number] Date of Birth/Sex: Treating RN: 27-Nov-1950 (72 y.o. M) Primary Care Praise Stennett: [name] Other Other Clinician: Referring Ashyah Quizon: Treating Diamonds Lippard/Extender: [name] Weeks in Treatment: 16 Vital Signs Height(in): 72 Pulse(bpm): 121 Weight(lbs): 185 Blood  Pressure(mmHg): 179/74 Body Mass Index(BMI): 25.1 Temperature(F): 98.5 Respiratory Rate(breaths/min): 18 [1:Photos:] [N/A:N/A] Right, Medial T Great oe N/A N/A Wound Location: Pressure Injury N/A N/A Wounding Event: Diabetic Wound/Ulcer of the Lower N/A N/A Primary Etiology: Extremity Acute Osteomyelitis N/A N/A Secondary Etiology: Cataracts, Hypertension, Type II N/A N/A Comorbid History: Diabetes, Osteomyelitis, Neuropathy, Confinement Anxiety 08/16/2022 N/A N/A Date Acquired: 16 N/A N/A Weeks of Treatment: Open N/A N/A Wound Status: No N/A N/A Wound Recurrence: 0.6x0.6x0.2 N/A N/A Measurements L x W x D (cm) 0.283 N/A N/A A (cm) : rea 0.057 N/A N/A Volume (cm) : -2.90% N/A N/A % Reduction in A  rea: 48.20% N/A N/A % Reduction in Volume: Grade 3 N/A N/A Classification: Medium N/A N/A Exudate A mount: Serosanguineous N/A N/A Exudate Type: red, brown N/A N/A Exudate Color: Distinct, outline attached N/A N/A Wound Margin: Large (67-100%) N/A N/A Granulation A mount: Red N/A N/A Granulation Quality: Small (1-33%) N/A N/A Necrotic A mount: Fat Layer (Subcutaneous Tissue): Yes N/A N/A Exposed Structures: Tendon: Yes Joint: Yes Bone: Yes Fascia: No Muscle: No Small (1-33%) N/A N/A Epithelialization: Debridement - Excisional N/A N/A DebridementXZAVIA, [name] ([id number]) [id number] Page 3 of 8 11:45 N/A N/A Pre-procedure Verification/Time Out Taken: Lidocaine 4% Topical Solution N/A N/A Pain Control: Bone, Slough N/A N/A Tissue Debrided: Skin/Subcutaneous N/A N/A Level: Tissue/Muscle/Bone 0.28 N/A N/A Debridement A (sq cm): rea Curette, Rongeur N/A N/A Instrument: Minimum N/A N/A Bleeding: Pressure N/A N/A Hemostasis Achieved: Debridement Treatment Response: Procedure was tolerated well N/A N/A Post Debridement Measurements L x 0.6x0.6x0.2 N/A N/A W x D (cm) 0.057 N/A N/A Post Debridement Volume:  (cm) Callus: Yes N/A N/A Periwound Skin Texture: Maceration: Yes N/A N/A Periwound Skin Moisture: No Abnormalities Noted N/A N/A Periwound Skin Color: No Abnormality N/A N/A Temperature: Cellular or Tissue Based Product N/A N/A Procedures Performed: Debridement Treatment Notes Wound #1 (Toe Great) Wound Laterality: Right, Medial Cleanser Soap and Water Discharge Instruction: May shower and wash wound with dial antibacterial soap and water prior to dressing change. Wound Cleanser Discharge Instruction: Cleanse the wound with wound cleanser prior to applying a clean dressing using gauze sponges, not tissue or cotton balls. Peri-Wound Care Topical Primary Dressing Epicord Secondary Dressing ADAPTIC TOUCH 3x4.25 in Discharge Instruction: Apply over primary dressing as directed. Woven Gauze Sponge, Non-Sterile 4x4 in Discharge Instruction: Apply over primary dressing as directed. Secured With Conforming Stretch Gauze Bandage, Sterile 2x75 (in/in) Discharge Instruction: Secure with stretch gauze as directed. 63M Medipore Soft Cloth Surgical T 2x10 (in/yd) ape Discharge Instruction: Secure with tape as directed. Compression Wrap Compression Stockings Add-Ons Electronic Signature(s) Signed: 02/02/2023 3:17:59 PM By: [name] MD FACS Entered By: [name] on 02/02/2023 15:17:59 -------------------------------------------------------------------------------- Multi-Disciplinary Care Plan Details Patient Name: Date of Service: [place] [name], [name] 02/02/2023 11:30 A M Medical Record Number: [id number] Patient Account Number: [id number] Date of Birth/Sex: Treating RN: 09/02/1951 (72 y.o. [name] Primary Care Timberlee Roblero: [name] Other Other Clinician: Referring Yemariam Magar: Treating Kiandre Spagnolo/Extender: [name] Weeks in Treatment: 16 Multidisciplinary Care Plan reviewed with physician [address] [place], [name] ([id number])  [id number] Page 4 of 8 HBO Nursing Diagnoses: Potential for barotraumas to ears, sinuses, teeth, and lungs or cerebral gas embolism related to changes in atmospheric pressure inside hyperbaric oxygen chamber Potential for oxygen toxicity seizures related to delivery of 100% oxygen at an increased atmospheric pressure Potential for pulmonary oxygen toxicity related to delivery of 100% oxygen at an increased atmospheric pressure Goals: Barotrauma will be prevented during HBO2 Date Initiated: 12/28/2022 T arget Resolution Date: 02/22/2023 Goal Status: Active Patient and/or family will be able to state/discuss factors appropriate to the management of their disease process during treatment Date Initiated: 12/28/2022 T arget Resolution Date: 02/22/2023 Goal Status: Active Patient will tolerate the hyperbaric oxygen therapy treatment Date Initiated: 12/28/2022 T arget Resolution Date: 02/22/2023 Goal Status: Active Patient will tolerate the internal climate of the chamber Date Initiated: 12/28/2022 T arget Resolution Date: 02/22/2023 Goal Status: Active Interventions: Administer the correct therapeutic gas delivery based on the patients needs and limitations, per physician order Assess and provide for patients comfort related to the hyperbaric environment and  equalization of middle ear Assess for signs and symptoms related to adverse events, including but not limited to confinement anxiety, pneumothorax, oxygen toxicity and baurotrauma Assess patient for any history of confinement anxiety Assess patient's knowledge and expectations regarding hyperbaric medicine and provide education related to the hyperbaric environment, goals of treatment and prevention of adverse events Implement protocols to decrease risk of pneumothorax in high risk patients Notes: Nutrition Nursing Diagnoses: Impaired glucose control: actual or potential Goals: Patient/caregiver will maintain  therapeutic glucose control Date Initiated: 10/12/2022 Target Resolution Date: 02/22/2023 Goal Status: Active Interventions: Provide education on elevated blood sugars and impact on wound healing Provide education on nutrition Treatment Activities: Dietary management education, guidance and counseling : 10/12/2022 Giving encouragement to exercise : 10/12/2022 Notes: Osteomyelitis Nursing Diagnoses: Infection: osteomyelitis Goals: Patient/caregiver will verbalize understanding of disease process and disease management Date Initiated: 10/12/2022 Target Resolution Date: 02/22/2023 Goal Status: Active Interventions: Assess for signs and symptoms of osteomyelitis resolution every visit Provide education on osteomyelitis Treatment Activities: MRI : 08/16/2022 Notes: Wound/Skin Impairment Nursing Diagnoses: Impaired tissue integrity Diffley, Nagi ([id number]) [phone].pdf Page 5 of 8 Goals: Patient/caregiver will verbalize understanding of skin care regimen Date Initiated: 01/26/2023 Date Inactivated: 02/02/2023 Target Resolution Date: 02/22/2023 Goal Status: Met Ulcer/skin breakdown will have a volume reduction of 30% by week 4 Date Initiated: 10/12/2022 Date Inactivated: 01/26/2023 Target Resolution Date: 01/25/2023 Goal Status: Met Ulcer/skin breakdown will have a volume reduction of 50% by week 8 Date Initiated: 01/26/2023 Target Resolution Date: 02/22/2023 Goal Status: Active Interventions: Assess ulceration(s) every visit Provide education on smoking Treatment Activities: Skin care regimen initiated : 10/12/2022 Smoking cessation education : 10/12/2022 Notes: Electronic Signature(s) Signed: 02/02/2023 3:21:07 PM By: [name] Entered By: [name] on 02/02/2023 11:50:10 -------------------------------------------------------------------------------- Pain Assessment Details Patient Name: Date of ServiceDorise [name], [name] 02/02/2023 11:30 A  M Medical Record Number: [id number] Patient Account Number: [id number] Date of Birth/Sex: Treating RN: 1951/05/08 (72 y.o. [name] Primary Care Joshua Zeringue: [name] Other Other Clinician: Referring Kenyonna Micek: Treating Lashon Hillier/Extender: [name] Weeks in Treatment: 16 Active Problems Location of Pain Severity and Description of Pain Patient Has Paino Yes Site Locations Pain Location: Generalized Pain, Pain in Ulcers Duration of the Pain. Constant / Intermittento Intermittent Rate the pain. Current Pain Level: 5 Pain Management and Medication Current Pain Management: Electronic Signature(s) Signed: 02/02/2023 3:21:07 PM By: [name] Entered By: [name] on 02/02/2023 11:37:22 Patient/Caregiver Education Details -------------------------------------------------------------------------------- [name] ([id number]) [id number] Page 6 of 8 Patient Name: Date of Service: [name], [name] 5/3/2024andnbsp11:30 A M Medical Record Number: [id number] Patient Account Number: [id number] Date of Birth/Gender: Treating RN: 18-Jan-1951 (72 y.o. [name] Primary Care Physician: [name] Other Other Clinician: Referring Physician: Treating Physician/Extender: Gracy Racer in Treatment: 16 Education Assessment Education Provided To: Patient Education Topics Provided Wound/Skin Impairment: Methods: Explain/Verbal Responses: Reinforcements needed, State content correctly Electronic Signature(s) Signed: 02/02/2023 3:21:07 PM By: [name] Entered By: [name] on 02/02/2023 11:50:28 -------------------------------------------------------------------------------- Wound Assessment Details Patient Name: Date of Service: [name], [name] 02/02/2023 11:30 A M Medical Record Number: [id number] Patient Account Number: [id number] Date of Birth/Sex: Treating RN: 1951/04/07 (72  y.o. [name] Primary Care Kenadi Miltner: [name] Other Other Clinician: Referring Dawnna Gritz: Treating Livia Tarr/Extender: [name] Weeks in Treatment: 16 Wound Status Wound Number: 1 Primary Diabetic Wound/Ulcer of the Lower Extremity Etiology: Wound Location: Right, Medial T Great oe Secondary Acute Osteomyelitis Wounding Event: Pressure Injury Etiology: Date Acquired: 08/16/2022  Wound Open Weeks Of Treatment: 16 Status: Clustered Wound: No Comorbid Cataracts, Hypertension, Type II Diabetes, Osteomyelitis, History: Neuropathy, Confinement Anxiety Photos Wound Measurements Length: (cm) 0.6 Width: (cm) 0.6 Depth: (cm) 0.2 Area: (cm) 0.283 Volume: (cm) 0.057 % Reduction in Area: -2.9% % Reduction in Volume: 48.2% Epithelialization: Small (1-33%) Tunneling: No Undermining: No Wound Description Classification: Grade 3 Wound Margin: Distinct, outline attached Exudate Amount: Medium Exudate Type: Serosanguineous Exudate Color: red, brown Wiest, [name] ([id number]) Wound Bed Granulation Amount: Large (67-100%) Granulation Quality: Red Necrotic Amount: Small (1-33%) Necrotic Quality: Adherent Slough Foul Odor After Cleansing: No Slough/Fibrino Yes [phone].pdf Page 7 of 8 Exposed Structure Fascia Exposed: No Fat Layer (Subcutaneous Tissue) Exposed: Yes Tendon Exposed: Yes Muscle Exposed: No Joint Exposed: Yes Bone Exposed: Yes Periwound Skin Texture Texture Color No Abnormalities Noted: Yes No Abnormalities Noted: Yes Moisture Temperature / Pain No Abnormalities Noted: Yes Temperature: No Abnormality Treatment Notes Wound #1 (Toe Great) Wound Laterality: Right, Medial Cleanser Soap and Water Discharge Instruction: May shower and wash wound with dial antibacterial soap and water prior to dressing change. Wound Cleanser Discharge Instruction: Cleanse the wound with wound cleanser prior to applying a clean  dressing using gauze sponges, not tissue or cotton balls. Peri-Wound Care Topical Primary Dressing Epicord Secondary Dressing ADAPTIC TOUCH 3x4.25 in Discharge Instruction: Apply over primary dressing as directed. Woven Gauze Sponge, Non-Sterile 4x4 in Discharge Instruction: Apply over primary dressing as directed. Secured With Conforming Stretch Gauze Bandage, Sterile 2x75 (in/in) Discharge Instruction: Secure with stretch gauze as directed. 25M Medipore Soft Cloth Surgical T 2x10 (in/yd) ape Discharge Instruction: Secure with tape as directed. Compression Wrap Compression Stockings Add-Ons Electronic Signature(s) Signed: 02/02/2023 3:21:07 PM By: [name] Entered By: [name] on 02/02/2023 11:41:03 -------------------------------------------------------------------------------- Vitals Details Patient Name: Date of Service: [name], [name] 02/02/2023 11:30 A M Medical Record Number: [id number] Patient Account Number: [id number] Date of Birth/Sex: Treating RN: 24-Oct-1950 (72 y.o. [name] Primary Care [place] Nolton: [name] Other Other Clinician: Referring Vaishali Baise: Treating Bret Stamour/Extender: [name] Weeks in Treatment: 16 Vital Signs Time Taken: 11:36 Temperature (F): 98.5 Height (in): 72 Pulse (bpm): 121 Weight (lbs): 185 Respiratory Rate (breaths/min): 18 Body Mass Index (BMI): 25.1 Blood Pressure (mmHg): 179/74 Reference Range: 80 - 120 mg / dl Sachdev, Honest ([id number]) [id number].pdf Page 8 of 8 Electronic Signature(s) Signed: 02/02/2023 3:21:07 PM By: [name] Entered By: [name] on 02/02/2023 11:37:09

## 2023-02-03 NOTE — Progress Notes (Signed)
[place], Eustacio ([id number]) [id number] Page 1 of 11 Visit Report for 02/02/2023 Chief Complaint Document Details Patient Name: Date of Service: [name], [name] [name] 02/02/2023 11:30 A M Medical Record Number: [id number] Patient Account Number: [id number] Date of Birth/Sex: Treating RN: 10/31/1950 (72 y.o. M) Primary Care Provider: [name] Other Other Clinician: Referring Provider: Treating Provider/Extender: [name] Weeks in Treatment: 16 Information Obtained from: Patient Chief Complaint Patients presents for treatment of an open diabetic ulcer on the right great toe Electronic Signature(s) Signed: 02/02/2023 3:18:24 PM By: [name] MD FACS Entered By: [name] on 02/02/2023 15:18:23 -------------------------------------------------------------------------------- Cellular or Tissue Based Product Details Patient Name: Date of Service: [name], [name] 02/02/2023 11:30 A M Medical Record Number: [id number] Patient Account Number: [id number] Date of Birth/Sex: Treating RN: Aug 16, 1951 (72 y.o. [name] Primary Care Provider: [name] Other Other Clinician: Referring Provider: Treating Provider/Extender: [name] Weeks in Treatment: 16 Cellular or Tissue Based Product Type Wound #1 Right,Medial T Great oe Applied to: Performed By: Physician [name], MD Cellular or Tissue Based Product Type: Epicord Level of Consciousness (Pre-procedure): Awake and Alert Pre-procedure Verification/Time Out Yes - 11:47 Taken: Location: genitalia / hands / feet / multiple digits Wound Size (sq cm): 0.36 Product Size (sq cm): 6 Waste Size (sq cm): 5 Waste Reason: wound size Amount of Product Applied (sq cm): 1 Instrument Used: Forceps, Scissors Lot #: [id number] Expiration Date: 09/02/2027 Fenestrated: No Reconstituted: Yes Solution Type: normal saline Solution Amount: 2 ml Lot #: [id number] Solution  Expiration Date: 03/01/2025 Secured: Yes Secured With: Steri-Strips Dressing Applied: Yes Primary Dressing: adaptic Procedural Pain: 0 Post Procedural Pain: 0 Response to Treatment: Procedure was tolerated well Level of Consciousness (Post- Awake and Alert procedure): Post Procedure Diagnosis Same as Pre-procedure Notes scribed for Dr. Lady Gary by Samuella Bruin, RN [name], [name] ([id number]) [id number] Page 2 of 11 Electronic Signature(s) Signed: 02/02/2023 3:21:07 PM By: Samuella Bruin Signed: 02/02/2023 5:53:18 PM By: [name] MD FACS Entered By: Samuella Bruin on 02/02/2023 11:51:31 -------------------------------------------------------------------------------- Debridement Details Patient Name: Date of Service: [name], [name] 02/02/2023 11:30 A M Medical Record Number: [id number] Patient Account Number: [id number] Date of Birth/Sex: Treating RN: [birth year] (72 y.o. [name] Primary Care Provider: [name] Other Other Clinician: Referring Provider: Treating Provider/Extender: [name] Weeks in Treatment: 16 Debridement Performed for Assessment: Wound #1 Right,Medial T Great oe Performed By: Physician [name], MD Debridement Type: Debridement Severity of Tissue Pre Debridement: Bone involvement without necrosis Level of Consciousness (Pre-procedure): Awake and Alert Pre-procedure Verification/Time Out Yes - 11:45 Taken: Start Time: 11:45 Pain Control: Lidocaine 4% Topical Solution Percent of Wound Bed Debrided: 100% T Area Debrided (cm): otal 0.28 Tissue and other material debrided: Non-Viable, Bone, Slough, Slough Level: Skin/Subcutaneous Tissue/Muscle/Bone Debridement Description: Excisional Instrument: Curette, Rongeur Bleeding: Minimum Hemostasis Achieved: Pressure Response to Treatment: Procedure was tolerated well Level of Consciousness (Post- Awake and Alert procedure): Post  Debridement Measurements of Total Wound Length: (cm) 0.6 Width: (cm) 0.6 Depth: (cm) 0.2 Volume: (cm) 0.057 Character of Wound/Ulcer Post Debridement: Improved Severity of Tissue Post Debridement: Bone involvement without necrosis Post Procedure Diagnosis Same as Pre-procedure Notes scribed for Dr. Lady Gary by Samuella Bruin, RN Electronic Signature(s) Signed: 02/02/2023 3:21:07 PM By: Samuella Bruin Signed: 02/02/2023 5:53:18 PM By: [name] MD FACS Entered By: Samuella Bruin on 02/02/2023 11:49:50 -------------------------------------------------------------------------------- HPI Details Patient Name: Date of Service: [name], [name] 02/02/2023 11:30 A M Medical Record Number: [id number] Patient Account Number:  [id number] Date of Birth/Sex: Treating RN: 1951-07-09 (72 y.o. M) Primary Care Provider: [name] Other Other Clinician: Referring Provider: Treating Provider/Extender: [name] Weeks in Treatment: 16 History of Present Illness HPI Description: ADMISSION 10/12/2022 This is a 72 year old type II diabetic (last hemoglobin A1c 7.7 in October 2023) with stage V chronic kidney disease and hypertension. He has been followed by podiatry for an ulcer on his right great toe. He has a history of prior toe and foot infections requiring amputation. In November 2023, he had an outpatient MRI [place], [name] ([id number]) [id number] Page 3 of 11 that demonstrated osteomyelitis. He was admitted to the [name] for IV antibiotics and subsequently underwent irrigation and debridement of the toe with biopsy and placement of married 3 layer matrix. Cultures grew out MRSA and osteomyelitis was seen on pathology. He was followed by infectious disease and given a course of doxycycline and Augmentin. He completed this course at the end of December. He has been evaluated by vascular surgery with formal ABIs as well as consultation. He does  have adequate blood flow at least to the dorsalis pedis. He last saw podiatry on January 4 of this year where he underwent debridement. It was felt he would benefit from specialized wound care and he was referred to our [name] for further evaluation and management. On the dorsal aspect of his right great toe, there is a circular ulcer. There is a fair amount of undermining from callus and dry skin. The surface is a bit pale and fibrotic, but there is no obvious necrosis. 10/19/2022: He has been approved by his insurance to undergo hyperbaric oxygen therapy. EKG has been obtained and is normal. We are still waiting on his chest x-ray. The wound is clean with just a little bit of periwound callus and slough on the surface. Tendon is exposed. 10/26/2022: Unfortunately, the required co-pay from his insurance is cost prohibitive for him and he will not be able to receive hyperbaric oxygen therapy. Some moisture got under the dry skin around his wound and lifted it off but there is good epithelialized tissue beneath this. The exposed tendon has deteriorated. 11/03/2022: After some discussion with the financial office, it has become feasible for the patient to undergo hyperbaric oxygen therapy and he has received his first treatments this week. The wound is a little bit smaller and the tissue has a better color to it, but there is still extensive nonviable subcutaneous tissue and slough present. 11/09/2022: The wound measured a little bit smaller today. There is still slough and nonviable subcu tissue and tendon present. 11/16/2022: The wound continues to contract. There is still substantial depth to it, however, and bone remains exposed at the base. This is also a (early) 30-day update for HBO. 11/27/2022: Unfortunately, the wound has deteriorated. It appears that the tissue has become macerated. There is a second ulcer on the lateral aspect of his great toe. The nail is lifting off of the bed and there is wet skin  lifting off of the underlying tissue. The initial wound is smaller but still has a fair amount of depth to it. The patient does report quite a bit more drainage since his last visit. 12/20/2022: For scheduling reasons, somehow we have missed having a wound care visit in between his hyperbaric oxygen therapy treatments. The 2 openings of the wound have converged into 1 large site. There is necrotic tendon present at the lateral aspect of the great toe. The bone is visible, including the joint space.  The area of the initial wound is flush with the surrounding skin. No malodor or purulent drainage. 12/28/2022: The wound is vastly improved this week. The 2 areas of the wound have separated due to tissue bridging and healing the space between. There is no more necrotic tissue visible, although the bone/joint space remains exposed. 01/04/2023: His wound continues to improve. The more medial aspect of the toe is nearly closed. Bone remains exposed laterally. He has been approved for Epicord. He is going to start dialysis next week. 01/11/2023: The medial aspect of the wound is closed completely. The lateral open portion of his wound has contracted and is shallower. There is still bone exposure. He has started dialysis. We are going to apply Epicord today. 01/19/2023: There is good granulation tissue starting to fill in his wound. Bone remains exposed, however. 01/26/2023: The wound is smaller again today. The granulation tissue is a bit hypertrophic. There is still an area of bone that remains exposed. 02/02/2023: The wound is about the same size but has better tissue coverage. There is a protruding point of bone present. His insurance has changed and we are needing to secure approval again for his hyperbaric oxygen therapy treatments. Electronic Signature(s) Signed: 02/02/2023 3:23:18 PM By: [name] MD FACS Entered By: [name] on 02/02/2023  15:23:18 -------------------------------------------------------------------------------- Physical Exam Details Patient Name: Date of Service: [name], [name] 02/02/2023 11:30 A M Medical Record Number: [id number] Patient Account Number: [id number] Date of Birth/Sex: Treating RN: 26-Mar-1951 (72 y.o. M) Primary Care Provider: [name] Other Other Clinician: Referring Provider: Treating Provider/Extender: [name] Weeks in Treatment: 16 Constitutional Hypertensive, asymptomatic. Tachycardic, asymptomatic. . . no acute distress. Respiratory Normal work of breathing on room air. Notes 02/02/2023: The wound is about the same size but has better tissue coverage. There is a protruding point of bone present. Electronic Signature(s) Signed: 02/02/2023 3:23:56 PM By: [name] MD FACS Entered By: [name] on 02/02/2023 15:23:56 Physician Orders Details -------------------------------------------------------------------------------- [name] ([id number]) [id number] Page 4 of 11 Patient Name: Date of ServiceDorise [name], [name] [name] 02/02/2023 11:30 A M Medical Record Number: [id number] Patient Account Number: [id number] Date of Birth/Sex: Treating RN: [birth year] (72 y.o. [name] Primary Care Provider: [name] Other Other Clinician: Referring Provider: Treating Provider/Extender: [name] Weeks in Treatment: [id number] Verbal / Phone Orders: No Diagnosis Coding ICD-10 Coding Code Description L97.514 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.621 Type 2 diabetes mellitus with foot ulcer N18.6 End stage renal disease M86.9 Osteomyelitis, unspecified E11.42 Type 2 diabetes mellitus with diabetic polyneuropathy I10 Essential (primary) hypertension Follow-up Appointments ppointment in 1 week. - Dr. Lady Gary Rm 1 Return A Anesthetic Wound #1 Right,Medial T Great oe (In clinic)  Topical Lidocaine 4% applied to wound bed Cellular or Tissue Based Products Cellular or Tissue Based Product Type: - epicord #4 Cellular or Tissue Based Product applied to wound bed, secured with steri-strips, cover with Adaptic or Mepitel. (DO NOT REMOVE). Bathing/ Shower/ Hygiene May shower with protection but do not get wound dressing(s) wet. Protect dressing(s) with water repellant cover (for example, large plastic bag) or a cast cover and may then take shower. Off-Loading Wound #1 Right,Medial T Great oe Other: - Diabetic shoe Hyperbaric Oxygen Therapy Evaluate for HBO Therapy Indication: - chronic refractory osteomyelitis If appropriate for treatment, begin HBOT per protocol: 2.5 ATA for 90 Minutes with 2 Five (5) Minute A Breaks ir Total Number of Treatments: - 40  plus 40 (12/20/22) One treatments per day (delivered Monday through Friday unless otherwise specified in Special Instructions below): Finger stick Blood Glucose Pre- and Post- HBOT Treatment. Follow Hyperbaric Oxygen Glycemia Protocol Give two 4oz orange juices in addition to Glucerna when the glycemic protocol is used. A frin (Oxymetazoline HCL) 0.05% nasal spray - 1 spray in both nostrils daily as needed prior to HBO treatment for difficulty clearing ears Wound Treatment Wound #1 - T Great oe Wound Laterality: Right, Medial Cleanser: Soap and Water 1 x Per Week/30 Days Discharge Instructions: May shower and wash wound with dial antibacterial soap and water prior to dressing change. Cleanser: Wound Cleanser 1 x Per Week/30 Days Discharge Instructions: Cleanse the wound with wound cleanser prior to applying a clean dressing using gauze sponges, not tissue or cotton balls. Prim Dressing: Epicord ary 1 x Per Week/30 Days Secondary Dressing: ADAPTIC TOUCH 3x4.25 in 1 x Per Week/30 Days Discharge Instructions: Apply over primary dressing as directed. Secondary Dressing: Woven Gauze Sponge, Non-Sterile 4x4 in 1 x Per  Week/30 Days Discharge Instructions: Apply over primary dressing as directed. Secured With: Insurance underwriter, Sterile 2x75 (in/in) 1 x Per Week/30 Days Discharge Instructions: Secure with stretch gauze as directed. Secured With: 71M Med[employer] Scientist, research (life sciences) Surgical T 2x10 (in/yd) 1 x Per Week/30 Days ape Discharge Instructions: Secure with tape as directed. Patient Medications [name], [name] ([id number]) [id number] Page 5 of 11 llergies: losartan potassium, sildenafil A Notifications Medication Indication Start End 02/02/2023 lidocaine DOSE topical 4 % cream - cream topical GLYCEMIA INTERVENTIONS PROTOCOL PRE-HBO GLYCEMIA INTERVENTIONS ACTION INTERVENTION Obtain pre-HBO capillary blood glucose (ensure 1 physician order is in chart). A. Notify HBO physician and await physician orders. 2 If result is 70 mg/dl or below: B. If the result meets the [name] definition of a critical result, follow [name] policy. A. Give patient an 8 ounce Glucerna Shake, an 8 ounce Ensure, or 8 ounces of a Glucerna/Ensure equivalent dietary supplement*. B. Wait 30 minutes. If result is 71 mg/dl to 409 mg/dl: C. Retest patients capillary blood glucose (CBG). D. If result greater than or equal to 110 mg/dl, proceed with HBO. If result less than 110 mg/dl, notify HBO physician and consider holding HBO. If result is 131 mg/dl to 811 mg/dl: A. Proceed with HBO. A. Notify HBO physician and await physician orders. B. It is recommended to hold HBO and do If result is 250 mg/dl or greater: blood/urine ketone testing. C. If the result meets the [name] definition of a critical result, follow [name] policy. POST-HBO GLYCEMIA INTERVENTIONS ACTION INTERVENTION Obtain post HBO capillary blood glucose (ensure 1 physician order is in chart). A. Notify HBO physician and await physician orders. 2 If result is 70 mg/dl or below: B. If the result meets the  [name] definition of a critical result, follow [name] policy. A. Give patient an 8 ounce Glucerna Shake, an 8 ounce Ensure, or 8 ounces of a Glucerna/Ensure equivalent dietary supplement*. B. Wait 15 minutes for symptoms of If result is 71 mg/dl to 914 mg/dl: hypoglycemia (i.e. nervousness, anxiety, sweating, chills, clamminess, irritability, confusion, tachycardia or dizziness). C. If patient asymptomatic, discharge patient. If patient symptomatic, repeat capillary blood glucose (CBG) and notify HBO physician. If result is 101 mg/dl to 782 mg/dl: A. Discharge patient. A. Notify HBO physician and await physician orders. B. It is recommended to do blood/urine ketone If result is 250 mg/dl or greater: testing. C. If the result meets the [name] definition of a critical result, follow [name]  policy. *Juice or candies are NOT equivalent products. If patient refuses the Glucerna or Ensure, please consult the [name] dietitian for an appropriate substitute. Electronic Signature(s) Signed: 02/02/2023 5:53:18 PM By: [name] MD FACS Previous Signature: 02/02/2023 3:21:07 PM Version By: Gelene Mink By: [name] on 02/02/2023 15:24:51 -------------------------------------------------------------------------------- Problem List Details Patient Name: Date of Service: [name], [name] 02/02/2023 11:30 A M Medical Record Number: [id number] Patient Account Number: [id number] Date of Birth/Sex: Treating RN: Oct 27, 1950 (72 y.o. M) Primary Care Provider: [name] Other Other Clinician: Referring Provider: Treating Provider/Extender: [name] Weeks in Treatment: [address] [place], Lola ([id number]) [id number] Page 6 of 11 ICD-10 Encounter Code Description Active Date MDM Diagnosis L97.514 Non-pressure chronic ulcer of other 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 10/12/2022 No Yes E11.621 Type 2  diabetes mellitus with foot ulcer 10/12/2022 No Yes N18.6 End stage renal disease 10/12/2022 No Yes M86.9 Osteomyelitis, unspecified 10/12/2022 No Yes E11.42 Type 2 diabetes mellitus with diabetic polyneuropathy 10/12/2022 No Yes I10 Essential (primary) hypertension 10/12/2022 No Yes Inactive Problems Resolved Problems Electronic Signature(s) Signed: 02/02/2023 3:17:51 PM By: [name] MD FACS Entered By: [name] on 02/02/2023 15:17:51 -------------------------------------------------------------------------------- Progress Note Details Patient Name: Date of Service: [name], [name] 02/02/2023 11:30 A M Medical Record Number: [id number] Patient Account Number: [id number] Date of Birth/Sex: Treating RN: Apr 15, 1951 (73 y.o. M) Primary Care Provider: [name] Other Other Clinician: Referring Provider: Treating Provider/Extender: [name] Weeks in Treatment: 16 Subjective Chief Complaint Information obtained from Patient Patients presents for treatment of an open diabetic ulcer on the right great toe History of Present Illness (HPI) ADMISSION 10/12/2022 This is a 72 year old type II diabetic (last hemoglobin A1c 7.7 in October 2023) with stage V chronic kidney disease and hypertension. He has been followed by podiatry for an ulcer on his right great toe. He has a history of prior toe and foot infections requiring amputation. In November 2023, he had an outpatient MRI that demonstrated osteomyelitis. He was admitted to the [name] for IV antibiotics and subsequently underwent irrigation and debridement of the toe with biopsy and placement of married 3 layer matrix. Cultures grew out MRSA and osteomyelitis was seen on pathology. He was followed by infectious disease and given a course of doxycycline and Augmentin. He completed this course at the end of December. He has been evaluated by vascular surgery with formal ABIs as well as consultation. He does have adequate  blood flow at least to the dorsalis pedis. He last saw podiatry on January 4 of this year where he underwent debridement. It was felt he would benefit from specialized wound care and he was referred to our [name] for further evaluation and management. On the dorsal aspect of his right great toe, there is a circular ulcer. There is a fair amount of undermining from callus and dry skin. The surface is a bit pale and fibrotic, but there is no obvious necrosis. 10/19/2022: He has been approved by his insurance to undergo hyperbaric oxygen therapy. EKG has been obtained and is normal. We are still waiting on his chest x-ray. The wound is clean with just a little bit of periwound callus and slough on the surface. Tendon is exposed. 10/26/2022: Unfortunately, the required co-pay from his insurance is cost prohibitive for him and he will not be able to receive hyperbaric oxygen therapy. Some moisture got under the dry skin around his wound and lifted it off but there is good epithelialized tissue  beneath this. The exposed tendon has deteriorated. 11/03/2022: After some discussion with the financial office, it has become feasible for the patient to undergo hyperbaric oxygen therapy and he has received his first treatments this week. The wound is a little bit smaller and the tissue has a better color to it, but there is still extensive nonviable subcutaneous tissue and slough present. [name], [name] ([id number]) [id number] Page 7 of 11 11/09/2022: The wound measured a little bit smaller today. There is still slough and nonviable subcu tissue and tendon present. 11/16/2022: The wound continues to contract. There is still substantial depth to it, however, and bone remains exposed at the base. This is also a (early) 30-day update for HBO. 11/27/2022: Unfortunately, the wound has deteriorated. It appears that the tissue has become macerated. There is a second ulcer on the lateral aspect of  his great toe. The nail is lifting off of the bed and there is wet skin lifting off of the underlying tissue. The initial wound is smaller but still has a fair amount of depth to it. The patient does report quite a bit more drainage since his last visit. 12/20/2022: For scheduling reasons, somehow we have missed having a wound care visit in between his hyperbaric oxygen therapy treatments. The 2 openings of the wound have converged into 1 large site. There is necrotic tendon present at the lateral aspect of the great toe. The bone is visible, including the joint space. The area of the initial wound is flush with the surrounding skin. No malodor or purulent drainage. 12/28/2022: The wound is vastly improved this week. The 2 areas of the wound have separated due to tissue bridging and healing the space between. There is no more necrotic tissue visible, although the bone/joint space remains exposed. 01/04/2023: His wound continues to improve. The more medial aspect of the toe is nearly closed. Bone remains exposed laterally. He has been approved for Epicord. He is going to start dialysis next week. 01/11/2023: The medial aspect of the wound is closed completely. The lateral open portion of his wound has contracted and is shallower. There is still bone exposure. He has started dialysis. We are going to apply Epicord today. 01/19/2023: There is good granulation tissue starting to fill in his wound. Bone remains exposed, however. 01/26/2023: The wound is smaller again today. The granulation tissue is a bit hypertrophic. There is still an area of bone that remains exposed. 02/02/2023: The wound is about the same size but has better tissue coverage. There is a protruding point of bone present. His insurance has changed and we are needing to secure approval again for his hyperbaric oxygen therapy treatments. Patient History Information obtained from Patient. Family History Diabetes - Mother,Siblings, Heart Disease  - Siblings, Hypertension - Siblings, Seizures - Father, Stroke - Siblings, No family history of Cancer, Hereditary Spherocytosis, Kidney Disease, Lung Disease, Thyroid Problems, Tuberculosis. Social History Former smoker, Marital Status - Married, Alcohol Use - Rarely, Drug Use - No History, Caffeine Use - Daily. Medical History Eyes Patient has history of Cataracts - rt eye Ear/Nose/Mouth/Throat Denies history of Chronic sinus problems/congestion Hematologic/Lymphatic Denies history of Anemia, Hemophilia, Human Immunodeficiency Virus, Lymphedema, Sickle Cell Disease Respiratory Denies history of Aspiration, Asthma, Chronic Obstructive Pulmonary Disease (COPD), Pneumothorax, Tuberculosis Cardiovascular Patient has history of Hypertension Gastrointestinal Denies history of Cirrhosis , Colitis, Crohnoos, Hepatitis A, Hepatitis B, Hepatitis C Endocrine Patient has history of Type II Diabetes Genitourinary Denies history of End Stage Renal Disease Immunological Denies history of Lupus  Erythematosus, Raynaudoos, Scleroderma Integumentary (Skin) Denies history of History of Burn Musculoskeletal Patient has history of Osteomyelitis - MRI confirmed (08/16/22) Denies history of Gout Neurologic Patient has history of Neuropathy Psychiatric Patient has history of Confinement Anxiety Denies history of Anorexia/bulimia Hospitalization/Surgery History - AV fistula place left upper arm 10/20/22. Medical A Surgical History Notes nd Genitourinary CKD IV Objective Constitutional Hypertensive, asymptomatic. Tachycardic, asymptomatic. no acute distress. [place], [place] ([id number]) [id number] Page 8 of 11 Vitals Time Taken: 11:36 AM, Height: 72 in, Weight: 185 lbs, BMI: 25.1, Temperature: 98.5 F, Pulse: 121 bpm, Respiratory Rate: 18 breaths/min, Blood Pressure: 179/74 mmHg. Respiratory Normal work of breathing on room air. General Notes: 02/02/2023: The wound is  about the same size but has better tissue coverage. There is a protruding point of bone present. Integumentary (Hair, Skin) Wound #1 status is Open. Original cause of wound was Pressure Injury. The date acquired was: 08/16/2022. The wound has been in treatment 16 weeks. The wound is located on the Right,Medial T Great. The wound measures 0.6cm length x 0.6cm width x 0.2cm depth; 0.283cm^2 area and 0.057cm^3 volume. oe There is bone, joint, tendon, and Fat Layer (Subcutaneous Tissue) exposed. There is no tunneling or undermining noted. There is a medium amount of serosanguineous drainage noted. The wound margin is distinct with the outline attached to the wound base. There is large (67-100%) red granulation within the wound bed. There is a small (1-33%) amount of necrotic tissue within the wound bed including Adherent Slough. The periwound skin appearance had no abnormalities noted for texture. The periwound skin appearance had no abnormalities noted for moisture. The periwound skin appearance had no abnormalities noted for color. Periwound temperature was noted as No Abnormality. Assessment Active Problems ICD-10 Non-pressure chronic ulcer of other 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 Type 2 diabetes mellitus with foot ulcer End stage renal disease Osteomyelitis, unspecified Type 2 diabetes mellitus with diabetic polyneuropathy Essential (primary) hypertension Procedures Wound #1 Pre-procedure diagnosis of Wound #1 is a Diabetic Wound/Ulcer of the Lower Extremity located on the Right,Medial T Great .Severity of Tissue Pre oe Debridement is: Bone involvement without necrosis. There was a Excisional Skin/Subcutaneous Tissue/Muscle/Bone Debridement with a total area of 0.28 sq cm performed by [name], MD. With the following instrument(s): Curette, and Rongeur to remove Non-Viable tissue/material. Material removed includes Bone,Slough and after achieving pain control using Lidocaine  4% Topical Solution. No specimens were taken. A time out was conducted at 11:45, prior to the start of the procedure. A Minimum amount of bleeding was controlled with Pressure. The procedure was tolerated well. Post Debridement Measurements: 0.6cm length x 0.6cm width x 0.2cm depth; 0.057cm^3 volume. Character of Wound/Ulcer Post Debridement is improved. Severity of Tissue Post Debridement is: Bone involvement without necrosis. Post procedure Diagnosis Wound #1: Same as Pre-Procedure General Notes: scribed for Dr. Lady Gary by Samuella Bruin, RN. Pre-procedure diagnosis of Wound #1 is a Diabetic Wound/Ulcer of the Lower Extremity located on the Right,Medial T Great. A skin graft procedure using a oe bioengineered skin substitute/cellular or tissue based product was performed by [name], MD with the following instrument(s): Forceps and Scissors. Epicord was applied and secured with Steri-Strips. 1 sq cm of product was utilized and 5 sq cm was wasted due to wound size. Post Application, adaptic was applied. A Time Out was conducted at 11:47, prior to the start of the procedure. The procedure was tolerated well with a pain level of 0 throughout and a pain level of 0  following the procedure. Post procedure Diagnosis Wound #1: Same as Pre-Procedure General Notes: scribed for Dr. Lady Gary by Samuella Bruin, RN. Plan Follow-up Appointments: Return Appointment in 1 week. - Dr. Lady Gary Rm 1 Anesthetic: Wound #1 Right,Medial T Great: oe (In clinic) Topical Lidocaine 4% applied to wound bed Cellular or Tissue Based Products: Cellular or Tissue Based Product Type: - epicord #4 Cellular or Tissue Based Product applied to wound bed, secured with steri-strips, cover with Adaptic or Mepitel. (DO NOT REMOVE). Bathing/ Shower/ Hygiene: May shower with protection but do not get wound dressing(s) wet. Protect dressing(s) with water repellant cover (for example, large plastic bag) or a cast cover and  may then take shower. Off-Loading: Wound #1 Right,Medial T Great: oe Other: - Diabetic shoe Hyperbaric Oxygen Therapy: Evaluate for HBO Therapy Indication: - chronic refractory osteomyelitis If appropriate for treatment, begin HBOT per protocol: 2.5 ATA for 90 Minutes with 2 Five (5) Minute Air Breaks T Number of Treatments: - 40 plus 40 (12/20/22) otal One treatments per day (delivered Monday through Friday unless otherwise specified in Special Instructions below): Finger stick Blood Glucose Pre- and Post- HBOT Treatment. [place], [place] ([id number]) [id number] Page 9 of 11 Follow Hyperbaric Oxygen Glycemia Protocol Give two 4oz orange juices in addition to Glucerna when the glycemic protocol is used. Afrin (Oxymetazoline HCL) 0.05% nasal spray - 1 spray in both nostrils daily as needed prior to HBO treatment for difficulty clearing ears The following medication(s) was prescribed: lidocaine topical 4 % cream cream topical was prescribed at facility WOUND #1: - T Great Wound Laterality: Right, Medial oe Cleanser: Soap and Water 1 x Per Week/30 Days Discharge Instructions: May shower and wash wound with dial antibacterial soap and water prior to dressing change. Cleanser: Wound Cleanser 1 x Per Week/30 Days Discharge Instructions: Cleanse the wound with wound cleanser prior to applying a clean dressing using gauze sponges, not tissue or cotton balls. Prim Dressing: Epicord 1 x Per Week/30 Days ary Secondary Dressing: ADAPTIC TOUCH 3x4.25 in 1 x Per Week/30 Days Discharge Instructions: Apply over primary dressing as directed. Secondary Dressing: Woven Gauze Sponge, Non-Sterile 4x4 in 1 x Per Week/30 Days Discharge Instructions: Apply over primary dressing as directed. Secured With: Insurance underwriter, Sterile 2x75 (in/in) 1 x Per Week/30 Days Discharge Instructions: Secure with stretch gauze as directed. Secured With: 71M Med[employer] Scientist, research (life sciences)  Surgical T 2x10 (in/yd) 1 x Per Week/30 Days ape Discharge Instructions: Secure with tape as directed. 02/02/2023: The wound is about the same size but has better tissue coverage. There is a protruding point of bone present. I used a curette to debride slough from the wound. I elected to trim back the bone that was protruding and so I debrided with a rongeur. I then prepared the Epicord by rehydrating it with saline. An appropriately sized piece was cut and placed into the wound. It was secured with Adaptic and Steri-Strips. The toe was wrapped by the nurse. Once his insurance approval was obtained, he will resume his hyperbaric oxygen treatments. Follow-up with me for wound care in 1 week. Electronic Signature(s) Signed: 02/02/2023 3:26:19 PM By: [name] MD FACS Entered By: [name] on 02/02/2023 15:26:19 -------------------------------------------------------------------------------- HxROS Details Patient Name: Date of Service: [facility] [name], [name] 02/02/2023 11:30 A M Medical Record Number: [id number] Patient Account Number: [id number] Date of Birth/Sex: Treating RN: 08/15/1951 (72 y.o. M) Primary Care Provider: [name] Other Other Clinician: Referring Provider: Treating Provider/Extender: [name] Weeks in Treatment: 16 Information  Obtained From Patient Eyes Medical History: Positive for: Cataracts - rt eye Ear/Nose/Mouth/Throat Medical History: Negative for: Chronic sinus problems/congestion Hematologic/Lymphatic Medical History: Negative for: Anemia; Hemophilia; Human Immunodeficiency Virus; Lymphedema; Sickle Cell Disease Respiratory Medical History: Negative for: Aspiration; Asthma; Chronic Obstructive Pulmonary Disease (COPD); Pneumothorax; Tuberculosis Cardiovascular Medical History: Positive for: Hypertension Gastrointestinal Medical History: Negative for: Cirrhosis ; Colitis; Crohns; Hepatitis A; Hepatitis B; Hepatitis C Gon[place], [place]  ([id number]) [phone].pdf Page 10 of 11 Endocrine Medical History: Positive for: Type II Diabetes Time with diabetes: 2000 Treated with: Insulin Blood sugar tested every day: Yes Tested : Genitourinary Medical History: Negative for: End Stage Renal Disease Past Medical History Notes: CKD IV Immunological Medical History: Negative for: Lupus Erythematosus; Raynauds; Scleroderma Integumentary (Skin) Medical History: Negative for: History of Burn Musculoskeletal Medical History: Positive for: Osteomyelitis - MRI confirmed (08/16/22) Negative for: Gout Neurologic Medical History: Positive for: Neuropathy Psychiatric Medical History: Positive for: Confinement Anxiety Negative for: Anorexia/bulimia HBO Extended History Items Eyes: Cataracts Immunizations Pneumococcal Vaccine: Received Pneumococcal Vaccination: Yes Received Pneumococcal Vaccination On or After 60th Birthday: Yes Implantable Devices None Hospitalization / Surgery History Type of Hospitalization/Surgery AV fistula place left upper arm 10/20/22 Family and Social History Cancer: No; Diabetes: Yes - Mother,Siblings; Heart Disease: Yes - Siblings; Hereditary Spherocytosis: No; Hypertension: Yes - Siblings; Kidney Disease: No; Lung Disease: No; Seizures: Yes - Father; Stroke: Yes - Siblings; Thyroid Problems: No; Tuberculosis: No; Former smoker; Marital Status - Married; Alcohol Use: Rarely; Drug Use: No History; Caffeine Use: Daily; Financial Concerns: No; Food, Clothing or Shelter Needs: No; Support System Lacking: No; Transportation Concerns: No Electronic Signature(s) Signed: 02/02/2023 5:53:18 PM By: [name] MD FACS Entered By: [name] on 02/02/2023 15:23:29 -------------------------------------------------------------------------------- SuperBill Details Patient Name: Date of Service: [place] [name] [name], [name] 02/02/2023 Medical Record Number: [id number] Patient Account  Number: [id number] TAU, RAMAN ([id number]) [id number] Page 11 of 11 Date of Birth/Sex: Treating RN: May 22, 1951 (72 y.o. M) Primary Care Provider: [name] Other Other Clinician: Referring Provider: Treating Provider/Extender: [name] Weeks in Treatment: 16 Diagnosis Coding ICD-10 Codes Code Description L97.514 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.621 Type 2 diabetes mellitus with foot ulcer N18.6 End stage renal disease M86.9 Osteomyelitis, unspecified E11.42 Type 2 diabetes mellitus with diabetic polyneuropathy I10 Essential (primary) hypertension Facility Procedures : CPT4 Code: 02725366 Description: Q4187 Epicord 2cm x 3cm - per sqcm Modifier: Quantity: 6 : CPT4 Code: 44034742 Description: 15275 - SKIN SUB GRAFT FACE/NK/HF/G ICD-10 Diagnosis Description L97.514 Non-pressure chronic ulcer of other 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 Modifier: Quantity: 1 Physician Procedures : CPT4 Code Description Modifier [id number] 99213 - WC PHYS LEVEL 3 - EST PT 25 ICD-10 Diagnosis Description L97.514 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.621 Type 2 diabetes mellitus with foot ulcer N18.6 End  stage renal disease M86.9 Osteomyelitis, unspecified Quantity: 1 : [id number] 15275 - WC PHYS SKIN SUB GRAFT FACE/NK/HF/G ICD-10 Diagnosis Description L97.514 Non-pressure chronic ulcer of other 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 Quantity: 1 Electronic Signature(s) Signed: 02/02/2023 3:26:45 PM By: [name] MD FACS Entered By: [name] on 02/02/2023 15:26:44

## 2023-02-05 ENCOUNTER — Encounter (HOSPITAL_BASED_OUTPATIENT_CLINIC_OR_DEPARTMENT_OTHER): Payer: Medicare PPO | Admitting: General Surgery

## 2023-02-06 ENCOUNTER — Encounter (HOSPITAL_BASED_OUTPATIENT_CLINIC_OR_DEPARTMENT_OTHER): Payer: No Typology Code available for payment source | Admitting: General Surgery

## 2023-02-06 DIAGNOSIS — N186 End stage renal disease: Secondary | ICD-10-CM | POA: Diagnosis not present

## 2023-02-06 DIAGNOSIS — Z992 Dependence on renal dialysis: Secondary | ICD-10-CM | POA: Diagnosis not present

## 2023-02-07 ENCOUNTER — Encounter (HOSPITAL_BASED_OUTPATIENT_CLINIC_OR_DEPARTMENT_OTHER): Payer: No Typology Code available for payment source | Admitting: General Surgery

## 2023-02-08 ENCOUNTER — Ambulatory Visit: Payer: Medicare PPO | Admitting: Family Medicine

## 2023-02-08 VITALS — BP 116/66 | Ht 72.0 in | Wt 193.0 lb

## 2023-02-08 DIAGNOSIS — E1142 Type 2 diabetes mellitus with diabetic polyneuropathy: Secondary | ICD-10-CM

## 2023-02-08 DIAGNOSIS — E782 Mixed hyperlipidemia: Secondary | ICD-10-CM

## 2023-02-08 DIAGNOSIS — N186 End stage renal disease: Secondary | ICD-10-CM | POA: Diagnosis not present

## 2023-02-08 DIAGNOSIS — Z794 Long term (current) use of insulin: Secondary | ICD-10-CM | POA: Diagnosis not present

## 2023-02-08 DIAGNOSIS — I10 Essential (primary) hypertension: Secondary | ICD-10-CM

## 2023-02-08 DIAGNOSIS — Z992 Dependence on renal dialysis: Secondary | ICD-10-CM | POA: Diagnosis not present

## 2023-02-08 NOTE — Progress Notes (Signed)
Subjective:  Patient ID: [name], male    DOB: 09-13-1951  Age: 72 y.o. MRN: [id number]  CC: Chief Complaint  Patient presents with   Diabetes    Follow up Started dialysis and nervous about that    HPI:  72 year old male with hypertension, type 2 diabetes with complications, ESRD (on dialysis Tuesday, Thursday, and Saturday), history of CVA, hyperlipidemia presents for follow-up.  Patient's hypertension is well-controlled.  He is on amlodipine 10 mg daily.  Patient has recently started dialysis.  He states that is going well.  Patient continues to follow with wound care regarding wound of his right great toe.  There has been no evidence of infection recently.  He is on antibiotic therapy.  Patient's A1c is at goal.  He is on NovoLog 70/30 30 units twice daily.  He also continues on Trulicity.  Per Lexi comp, there is no dose adjustment for Trulicity.  Patient denies any chest pain or shortness of breath.  Patient Active Problem List   Diagnosis Date Noted   ESRD (end stage renal disease) (HCC) 02/08/2023   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) 11/10/2022   Former smoker 07/12/2022   History of stroke 07/12/2022   Diabetic neuropathy (HCC) 10/28/2021   Anemia in chronic kidney disease 05/27/2020   History of amputation of lesser toe of left foot (HCC) 08/18/2015   Hyperlipidemia, mixed 08/18/2015   Type 2 diabetes mellitus with peripheral neuropathy (HCC) 08/18/2015   Essential hypertension 04/17/2013    Social Hx   Social History   Socioeconomic History   Marital status: Married    Spouse name: [name]   Number of children: Not on file   Years of education: Not on file   Highest education level: Not on file  Occupational History   Not on file  Tobacco Use   Smoking status: Former   Smokeless tobacco: Never  Building services engineer Use: Never used  Substance and Sexual Activity   Alcohol use: Not Currently   Drug use: Never   Sexual activity: Not on  file  Other Topics Concern   Not on file  Social History Narrative   Not on file   Social Determinants of Health   Financial Resource Strain: Not on file  Food Insecurity: Not on file  Transportation Needs: No Transportation Needs (08/17/2022)   PRAPARE - Administrator, Civil Service (Medical): No    Lack of Transportation (Non-Medical): No  Physical Activity: Not on file  Stress: Not on file  Social Connections: Not on file    Review of Systems Per HPI  Objective:  BP 116/66   Ht 6' (1.829 m)   Wt 193 lb (87.5 kg)   BMI 26.18 kg/m      02/08/2023    2:19 PM 01/10/2023    8:52 AM 01/10/2023    8:45 AM  BP/Weight  Systolic BP 116 167 169  Diastolic BP 66 73 75  Wt. (Lbs) 193  193  BMI 26.18 kg/m2  26.18 kg/m2    Physical Exam Vitals and nursing note reviewed.  Constitutional:      General: He is not in acute distress.    Appearance: Normal appearance.  HENT:     Head: Normocephalic and atraumatic.  Cardiovascular:     Rate and Rhythm: Normal rate and regular rhythm.  Pulmonary:     Effort: Pulmonary effort is normal.     Breath sounds: Normal breath sounds. No wheezing, rhonchi or  rales.  Neurological:     Mental Status: He is alert.  Psychiatric:        Mood and Affect: Mood normal.        Behavior: Behavior normal.     Lab Results  Component Value Date   WBC 9.4 10/18/2022   HGB 11.6 (L) 10/20/2022   HCT 34.0 (L) 10/20/2022   PLT 210 10/18/2022   GLUCOSE 160 (H) 10/20/2022   CHOL 122 07/11/2022   TRIG 219 (H) 07/11/2022   HDL 30 (L) 07/11/2022   LDLCALC 56 07/11/2022   ALT 90 (H) 10/05/2022   AST 32 10/05/2022   NA 144 10/20/2022   K 4.9 10/20/2022   CL 114 (H) 10/20/2022   CREATININE 4.80 (H) 10/20/2022   BUN 35 (H) 10/20/2022   CO2 23 10/18/2022   HGBA1C 6.8 (H) 11/10/2022     Assessment & Plan:   Problem List Items Addressed This Visit       Cardiovascular and Mediastinum   Essential hypertension    Stable.   Continue amlodipine.        Endocrine   Type 2 diabetes mellitus with peripheral neuropathy (HCC) - Primary    Stable.  A1c prior to next visit.  Continue NovoLog 70/30 and Trulicity.      Relevant Orders   Hemoglobin A1c     Genitourinary   ESRD (end stage renal disease) (HCC)     Other   Hyperlipidemia, mixed    Lipids have been well-controlled.  Continue atorvastatin.      Relevant Orders   Lipid panel    Follow-up: 3 months  Shruti Arrey Adriana Simas DO [facility]

## 2023-02-08 NOTE — Assessment & Plan Note (Signed)
Lipids have been well-controlled.  Continue atorvastatin.

## 2023-02-08 NOTE — Assessment & Plan Note (Signed)
Stable. °-Continue amlodipine °

## 2023-02-08 NOTE — Assessment & Plan Note (Signed)
Stable.  A1c prior to next visit.  Continue NovoLog 70/30 and Trulicity.

## 2023-02-08 NOTE — Patient Instructions (Signed)
Continue your medications.  Labs before your next visit.  See you in 3 months.

## 2023-02-09 ENCOUNTER — Encounter (HOSPITAL_BASED_OUTPATIENT_CLINIC_OR_DEPARTMENT_OTHER): Payer: Medicare PPO | Admitting: General Surgery

## 2023-02-09 ENCOUNTER — Encounter (HOSPITAL_BASED_OUTPATIENT_CLINIC_OR_DEPARTMENT_OTHER): Payer: No Typology Code available for payment source | Admitting: General Surgery

## 2023-02-09 ENCOUNTER — Other Ambulatory Visit (INDEPENDENT_AMBULATORY_CARE_PROVIDER_SITE_OTHER): Payer: Self-pay | Admitting: Vascular Surgery

## 2023-02-09 DIAGNOSIS — E1142 Type 2 diabetes mellitus with diabetic polyneuropathy: Secondary | ICD-10-CM | POA: Diagnosis not present

## 2023-02-09 DIAGNOSIS — N186 End stage renal disease: Secondary | ICD-10-CM

## 2023-02-09 DIAGNOSIS — E11621 Type 2 diabetes mellitus with foot ulcer: Secondary | ICD-10-CM | POA: Diagnosis not present

## 2023-02-09 DIAGNOSIS — I120 Hypertensive chronic kidney disease with stage 5 chronic kidney disease or end stage renal disease: Secondary | ICD-10-CM | POA: Diagnosis not present

## 2023-02-09 DIAGNOSIS — E1122 Type 2 diabetes mellitus with diabetic chronic kidney disease: Secondary | ICD-10-CM | POA: Diagnosis not present

## 2023-02-09 DIAGNOSIS — L97514 Non-pressure chronic ulcer of other 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: Secondary | ICD-10-CM | POA: Diagnosis not present

## 2023-02-09 DIAGNOSIS — Z992 Dependence on renal dialysis: Secondary | ICD-10-CM | POA: Diagnosis not present

## 2023-02-10 DIAGNOSIS — N186 End stage renal disease: Secondary | ICD-10-CM | POA: Diagnosis not present

## 2023-02-10 DIAGNOSIS — Z992 Dependence on renal dialysis: Secondary | ICD-10-CM | POA: Diagnosis not present

## 2023-02-10 NOTE — Progress Notes (Signed)
EMMERIC, AVETISYAN ([id number]) [id number] Page 1 of 9 Visit Report for 02/09/2023 Chief Complaint Document Details Patient Name: Date of Service: [name], [name] 02/09/2023 10:30 A M Medical Record Number: [id number] Patient Account Number: [id number] Date of Birth/Sex: Treating RN: 09/22/1951 (72 y.o. M) Primary Care Provider: [name] Other Other Clinician: Referring Provider: Treating Provider/Extender: [name] Weeks in Treatment: 17 Information Obtained from: Patient Chief Complaint Patients presents for treatment of an open diabetic ulcer on the right great toe Electronic Signature(s) Signed: 02/09/2023 11:37:01 AM By: [name] MD FACS Entered By: [name] on 02/09/2023 11:37:01 -------------------------------------------------------------------------------- Cellular or Tissue Based Product Details Patient Name: Date of Service: [facility], [name] 02/09/2023 10:30 A M Medical Record Number: [id number] Patient Account Number: [id number] Date of Birth/Sex: Treating RN: Sep 24, 1951 (72 y.o. [name] Primary Care Provider: [name] Other Other Clinician: Referring Provider: Treating Provider/Extender: Gracy Racer in Treatment: 17 Cellular or Tissue Based Product Type Wound #1 Right,Medial T Great oe Applied to: Performed By: Physician [name], MD Cellular or Tissue Based Product Type: Epicord Level of Consciousness (Pre-procedure): Awake and Alert Pre-procedure Verification/Time Out Yes - 11:00 Taken: Location: genitalia / hands / feet / multiple digits Wound Size (sq cm): 0.3 Product Size (sq cm): 6 Waste Size (sq cm): 5 Waste Reason: wound size Amount of Product Applied (sq cm): 1 Instrument Used: Forceps, Scissors Lot #: [id number] Order #: 5 Expiration Date: 10/03/2027 Fenestrated: No Reconstituted: Yes Solution Type: saline Solution Amount: 1 ml Lot #:  [id number] Solution Expiration Date: 02/28/2025 Secured: Yes Secured With: Steri-Strips Dressing Applied: Yes Primary Dressing: adaptic, kling Procedural Pain: 0 Post Procedural Pain: 0 Response to Treatment: Procedure was tolerated well Level of Consciousness (Post- Awake and Alert procedure): Post Procedure Diagnosis Same as Pre-procedure Electronic Signature(s) Signed: 02/09/2023 12:57:31 PM By: [name] MD FACS Gordo, [name] ([id number]) [id number] Page 2 of 9 Signed: 02/09/2023 4:28:56 PM By: Zenaida Deed RN, BSN Entered By: Zenaida Deed on 02/09/2023 10:56:01 -------------------------------------------------------------------------------- HPI Details Patient Name: Date of Service: [name], [name] 02/09/2023 10:30 A M Medical Record Number: [id number] Patient Account Number: [id number] Date of Birth/Sex: Treating RN: [birth year] (72 y.o. M) Primary Care Provider: [name] Other Other Clinician: Referring Provider: Treating Provider/Extender: [name] Weeks in Treatment: 17 History of Present Illness HPI Description: ADMISSION 10/12/2022 This is a 72 year old type II diabetic (last hemoglobin A1c 7.7 in October 2023) with stage V chronic kidney disease and hypertension. He has been followed by podiatry for an ulcer on his right great toe. He has a history of prior toe and foot infections requiring amputation. In November 2023, he had an outpatient MRI that demonstrated osteomyelitis. He was admitted to the hospital for IV antibiotics and subsequently underwent irrigation and debridement of the toe with biopsy and placement of married 3 layer matrix. Cultures grew out MRSA and osteomyelitis was seen on pathology. He was followed by infectious disease and given a course of doxycycline and Augmentin. He completed this course at the end of December. He has been evaluated by vascular surgery with formal ABIs as well as  consultation. He does have adequate blood flow at least to the dorsalis pedis. He last saw podiatry on January 4 of this year where he underwent debridement. It was felt he would benefit from specialized wound care and he was referred to our center for further evaluation and management. On the dorsal aspect of his right great toe, there is a circular ulcer.  There is a fair amount of undermining from callus and dry skin. The surface is a bit pale and fibrotic, but there is no obvious necrosis. 10/19/2022: He has been approved by his insurance to undergo hyperbaric oxygen therapy. EKG has been obtained and is normal. We are still waiting on his chest x-ray. The wound is clean with just a little bit of periwound callus and slough on the surface. Tendon is exposed. 10/26/2022: Unfortunately, the required co-pay from his insurance is cost prohibitive for him and he will not be able to receive hyperbaric oxygen therapy. Some moisture got under the dry skin around his wound and lifted it off but there is good epithelialized tissue beneath this. The exposed tendon has deteriorated. 11/03/2022: After some discussion with the financial office, it has become feasible for the patient to undergo hyperbaric oxygen therapy and he has received his first treatments this week. The wound is a little bit smaller and the tissue has a better color to it, but there is still extensive nonviable subcutaneous tissue and slough present. 11/09/2022: The wound measured a little bit smaller today. There is still slough and nonviable subcu tissue and tendon present. 11/16/2022: The wound continues to contract. There is still substantial depth to it, however, and bone remains exposed at the base. This is also a (early) 30-day update for HBO. 11/27/2022: Unfortunately, the wound has deteriorated. It appears that the tissue has become macerated. There is a second ulcer on the lateral aspect of his great toe. The nail is lifting off of the bed  and there is wet skin lifting off of the underlying tissue. The initial wound is smaller but still has a fair amount of depth to it. The patient does report quite a bit more drainage since his last visit. 12/20/2022: For scheduling reasons, somehow we have missed having a wound care visit in between his hyperbaric oxygen therapy treatments. The 2 openings of the wound have converged into 1 large site. There is necrotic tendon present at the lateral aspect of the great toe. The bone is visible, including the joint space. The area of the initial wound is flush with the surrounding skin. No malodor or purulent drainage. 12/28/2022: The wound is vastly improved this week. The 2 areas of the wound have separated due to tissue bridging and healing the space between. There is no more necrotic tissue visible, although the bone/joint space remains exposed. 01/04/2023: His wound continues to improve. The more medial aspect of the toe is nearly closed. Bone remains exposed laterally. He has been approved for Epicord. He is going to start dialysis next week. 01/11/2023: The medial aspect of the wound is closed completely. The lateral open portion of his wound has contracted and is shallower. There is still bone exposure. He has started dialysis. We are going to apply Epicord today. 01/19/2023: There is good granulation tissue starting to fill in his wound. Bone remains exposed, however. 01/26/2023: The wound is smaller again today. The granulation tissue is a bit hypertrophic. There is still an area of bone that remains exposed. 02/02/2023: The wound is about the same size but has better tissue coverage. There is a protruding point of bone present. His insurance has changed and we are needing to secure approval again for his hyperbaric oxygen therapy treatments. 02/09/2023: The wound is quite a bit smaller today and there is robust granulation tissue on the surface. He has decided to discontinue his hyperbaric  oxygen treatments due to the financial issues associated with  traveling to the wound care center on a daily basis, as well as his dialysis sessions. Electronic Signature(s) Signed: 02/09/2023 11:38:50 AM By: [name] MD FACS Entered By: [name] on 02/09/2023 11:38:49 -------------------------------------------------------------------------------- Physical Exam Details Patient Name: Date of Service: [name], [name] 02/09/2023 10:30 A M Medical Record Number: [id number] Patient Account Number: [id number] BUKHARI, SERVEDIO ([id number]) [id number] Page 3 of 9 Date of Birth/Sex: Treating RN: 03-Mar-1951 (72 y.o. M) Primary Care Provider: Other Clinician: [name] Other Referring Provider: Treating Provider/Extender: [name] Weeks in Treatment: 17 Constitutional Hypertensive, asymptomatic. Tachycardic, asymptomatic. . . no acute distress. Respiratory Normal work of breathing on room air. Notes 02/09/2023: The wound is quite a bit smaller today and there is robust granulation tissue on the surface. Electronic Signature(s) Signed: 02/09/2023 11:40:32 AM By: [name] MD FACS Entered By: [name] on 02/09/2023 11:40:32 -------------------------------------------------------------------------------- Physician Orders Details Patient Name: Date of Service: [facility], [name] 02/09/2023 10:30 A M Medical Record Number: [id number] Patient Account Number: [id number] Date of Birth/Sex: Treating RN: [birth year] (72 y.o. [name] Primary Care Provider: [name] Other Other Clinician: Referring Provider: Treating Provider/Extender: [name] Weeks in Treatment: 17 Verbal / Phone Orders: No Diagnosis Coding ICD-10 Coding Code Description L97.514 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.621 Type 2 diabetes mellitus with foot ulcer N18.6 End stage renal  disease M86.9 Osteomyelitis, unspecified E11.42 Type 2 diabetes mellitus with diabetic polyneuropathy I10 Essential (primary) hypertension Follow-up Appointments ppointment in 1 week. - Dr. Lady Gary Rm 1 Return A Friday 5/17 @ 10:30 am Anesthetic Wound #1 Right,Medial T Great oe (In clinic) Topical Lidocaine 4% applied to wound bed Cellular or Tissue Based Products Cellular or Tissue Based Product Type: - epicord #5 Cellular or Tissue Based Product applied to wound bed, secured with steri-strips, cover with Adaptic or Mepitel. (DO NOT REMOVE). Bathing/ Shower/ Hygiene May shower with protection but do not get wound dressing(s) wet. Protect dressing(s) with water repellant cover (for example, large plastic bag) or a cast cover and may then take shower. Off-Loading Wound #1 Right,Medial T Great oe Other: - Diabetic shoe Wound Treatment Wound #1 - T Great oe Wound Laterality: Right, Medial Cleanser: Soap and Water 1 x Per Week/30 Days Discharge Instructions: May shower and wash wound with dial antibacterial soap and water prior to dressing change. Cleanser: Wound Cleanser 1 x Per Week/30 Days Discharge Instructions: Cleanse the wound with wound cleanser prior to applying a clean dressing using gauze sponges, not tissue or cotton balls. Prim Dressing: Epicord ary 1 x Per Week/30 Days Ratchford, Tory ([id number]) [id number] Page 4 of 9 Secondary Dressing: ADAPTIC TOUCH 3x4.25 in 1 x Per Week/30 Days Discharge Instructions: Apply over primary dressing as directed. Secondary Dressing: Woven Gauze Sponge, Non-Sterile 4x4 in 1 x Per Week/30 Days Discharge Instructions: Apply over primary dressing as directed. Secured With: Insurance underwriter, Sterile 2x75 (in/in) 1 x Per Week/30 Days Discharge Instructions: Secure with stretch gauze as directed. Secured With: 35M Medipore Scientist, research (life sciences) Surgical T 2x10 (in/yd) 1 x Per Week/30 Days ape Discharge  Instructions: Secure with tape as directed. Electronic Signature(s) Signed: 02/09/2023 12:57:31 PM By: [name] MD FACS Entered By: [name] on 02/09/2023 11:41:05 -------------------------------------------------------------------------------- Problem List Details Patient Name: Date of Service: [facility], [name] 02/09/2023 10:30 A M Medical Record Number: [id number] Patient Account Number: [id number] Date of Birth/Sex: Treating RN: [birth year] (72 y.o. [name] Primary Care Provider:  [name] Other Other Clinician: Referring Provider: Treating Provider/Extender: [name] Weeks in Treatment: 17 Active Problems ICD-10 Encounter Code Description Active Date MDM Diagnosis L97.514 Non-pressure chronic ulcer of other 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 10/12/2022 No Yes E11.621 Type 2 diabetes mellitus with foot ulcer 10/12/2022 No Yes N18.6 End stage renal disease 10/12/2022 No Yes M86.9 Osteomyelitis, unspecified 10/12/2022 No Yes E11.42 Type 2 diabetes mellitus with diabetic polyneuropathy 10/12/2022 No Yes I10 Essential (primary) hypertension 10/12/2022 No Yes Inactive Problems Resolved Problems Electronic Signature(s) Signed: 02/09/2023 11:33:41 AM By: [name] MD FACS Entered By: [name] on 02/09/2023 11:33:41 Progress Note Details -------------------------------------------------------------------------------- Glorious Peach ([id number]) [id number] Page 5 of 9 Patient Name: Date of Service[place]ise [place], [name] 02/09/2023 10:30 A M Medical Record Number: [id number] Patient Account Number: [id number] Date of Birth/Sex: Treating RN: 17-Sep-1951 (72 y.o. M) Primary Care Provider: [name] Other Other Clinician: Referring Provider: Treating Provider/Extender: [name] Weeks in Treatment: 17 Subjective Chief Complaint Information obtained from Patient Patients presents for treatment  of an open diabetic ulcer on the right great toe History of Present Illness (HPI) ADMISSION 10/12/2022 This is a 72 year old type II diabetic (last hemoglobin A1c 7.7 in October 2023) with stage V chronic kidney disease and hypertension. He has been followed by podiatry for an ulcer on his right great toe. He has a history of prior toe and foot infections requiring amputation. In November 2023, he had an outpatient MRI that demonstrated osteomyelitis. He was admitted to the hospital for IV antibiotics and subsequently underwent irrigation and debridement of the toe with biopsy and placement of married 3 layer matrix. Cultures grew out MRSA and osteomyelitis was seen on pathology. He was followed by infectious disease and given a course of doxycycline and Augmentin. He completed this course at the end of December. He has been evaluated by vascular surgery with formal ABIs as well as consultation. He does have adequate blood flow at least to the dorsalis pedis. He last saw podiatry on January 4 of this year where he underwent debridement. It was felt he would benefit from specialized wound care and he was referred to our center for further evaluation and management. On the dorsal aspect of his right great toe, there is a circular ulcer. There is a fair amount of undermining from callus and dry skin. The surface is a bit pale and fibrotic, but there is no obvious necrosis. 10/19/2022: He has been approved by his insurance to undergo hyperbaric oxygen therapy. EKG has been obtained and is normal. We are still waiting on his chest x-ray. The wound is clean with just a little bit of periwound callus and slough on the surface. Tendon is exposed. 10/26/2022: Unfortunately, the required co-pay from his insurance is cost prohibitive for him and he will not be able to receive hyperbaric oxygen therapy. Some moisture got under the dry skin around his wound and lifted it off but there is good epithelialized tissue  beneath this. The exposed tendon has deteriorated. 11/03/2022: After some discussion with the financial office, it has become feasible for the patient to undergo hyperbaric oxygen therapy and he has received his first treatments this week. The wound is a little bit smaller and the tissue has a better color to it, but there is still extensive nonviable subcutaneous tissue and slough present. 11/09/2022: The wound measured a little bit smaller today. There is still slough and nonviable subcu tissue and tendon present. 11/16/2022: The wound continues to  contract. There is still substantial depth to it, however, and bone remains exposed at the base. This is also a (early) 30-day update for HBO. 11/27/2022: Unfortunately, the wound has deteriorated. It appears that the tissue has become macerated. There is a second ulcer on the lateral aspect of his great toe. The nail is lifting off of the bed and there is wet skin lifting off of the underlying tissue. The initial wound is smaller but still has a fair amount of depth to it. The patient does report quite a bit more drainage since his last visit. 12/20/2022: For scheduling reasons, somehow we have missed having a wound care visit in between his hyperbaric oxygen therapy treatments. The 2 openings of the wound have converged into 1 large site. There is necrotic tendon present at the lateral aspect of the great toe. The bone is visible, including the joint space. The area of the initial wound is flush with the surrounding skin. No malodor or purulent drainage. 12/28/2022: The wound is vastly improved this week. The 2 areas of the wound have separated due to tissue bridging and healing the space between. There is no more necrotic tissue visible, although the bone/joint space remains exposed. 01/04/2023: His wound continues to improve. The more medial aspect of the toe is nearly closed. Bone remains exposed laterally. He has been approved for Epicord. He is going to  start dialysis next week. 01/11/2023: The medial aspect of the wound is closed completely. The lateral open portion of his wound has contracted and is shallower. There is still bone exposure. He has started dialysis. We are going to apply Epicord today. 01/19/2023: There is good granulation tissue starting to fill in his wound. Bone remains exposed, however. 01/26/2023: The wound is smaller again today. The granulation tissue is a bit hypertrophic. There is still an area of bone that remains exposed. 02/02/2023: The wound is about the same size but has better tissue coverage. There is a protruding point of bone present. His insurance has changed and we are needing to secure approval again for his hyperbaric oxygen therapy treatments. 02/09/2023: The wound is quite a bit smaller today and there is robust granulation tissue on the surface. He has decided to discontinue his hyperbaric oxygen treatments due to the financial issues associated with traveling to the wound care center on a daily basis, as well as his dialysis sessions. Patient History Information obtained from Patient. Family History Diabetes - Mother,Siblings, Heart Disease - Siblings, Hypertension - Siblings, Seizures - Father, Stroke - Siblings, No family history of Cancer, Hereditary Spherocytosis, Kidney Disease, Lung Disease, Thyroid Problems, Tuberculosis. Social History Former smoker, Marital Status - Married, Alcohol Use - Rarely, Drug Use - No History, Caffeine Use - Daily. Medical History Eyes Patient has history of Cataracts - rt eye Ear/Nose/Mouth/Throat Denies history of Chronic sinus problems/congestion Hematologic/Lymphatic Denies history of Anemia, Hemophilia, Human Immunodeficiency Virus, Lymphedema, Sickle Cell Disease Respiratory Denies history of Aspiration, Asthma, Chronic Obstructive Pulmonary Disease (COPD), Pneumothorax, Tuberculosis Piccione, Abram ([id number]) [phone].pdf Page 6 of  9 Cardiovascular Patient has history of Hypertension Gastrointestinal Denies history of Cirrhosis , Colitis, Crohnoos, Hepatitis A, Hepatitis B, Hepatitis C Endocrine Patient has history of Type II Diabetes Genitourinary Denies history of End Stage Renal Disease Immunological Denies history of Lupus Erythematosus, Raynaudoos, Scleroderma Integumentary (Skin) Denies history of History of Burn Musculoskeletal Patient has history of Osteomyelitis - MRI confirmed (08/16/22) Denies history of Gout Neurologic Patient has history of Neuropathy Psychiatric Patient has history of Confinement Anxiety  Denies history of Anorexia/bulimia Hospitalization/Surgery History - AV fistula place left upper arm 10/20/22. Medical A Surgical History Notes nd Genitourinary CKD IV Objective Constitutional Hypertensive, asymptomatic. Tachycardic, asymptomatic. no acute distress. Vitals Time Taken: 10:39 AM, Height: 72 in, Weight: 185 lbs, BMI: 25.1, Temperature: 98.4 F, Pulse: 118 bpm, Respiratory Rate: 18 breaths/min, Blood Pressure: 153/73 mmHg. General Notes: pt did not check blood sugar today Respiratory Normal work of breathing on room air. General Notes: 02/09/2023: The wound is quite a bit smaller today and there is robust granulation tissue on the surface. Integumentary (Hair, Skin) Wound #1 status is Open. Original cause of wound was Pressure Injury. The date acquired was: 08/16/2022. The wound has been in treatment 17 weeks. The wound is located on the Right,Medial T Great. The wound measures 0.6cm length x 0.5cm width x 0.2cm depth; 0.236cm^2 area and 0.047cm^3 volume. oe There is bone, tendon, and Fat Layer (Subcutaneous Tissue) exposed. There is no tunneling or undermining noted. There is a small amount of serosanguineous drainage noted. The wound margin is distinct with the outline attached to the wound base. There is large (67-100%) red granulation within the wound bed. There is a  small (1-33%) amount of necrotic tissue within the wound bed including Adherent Slough. The periwound skin appearance had no abnormalities noted for texture. The periwound skin appearance had no abnormalities noted for moisture. The periwound skin appearance had no abnormalities noted for color. Periwound temperature was noted as No Abnormality. Assessment Active Problems ICD-10 Non-pressure chronic ulcer of other 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 Type 2 diabetes mellitus with foot ulcer End stage renal disease Osteomyelitis, unspecified Type 2 diabetes mellitus with diabetic polyneuropathy Essential (primary) hypertension Procedures Wound #1 Pre-procedure diagnosis of Wound #1 is a Diabetic Wound/Ulcer of the Lower Extremity located on the Right,Medial T Great. A skin graft procedure using a oe bioengineered skin substitute/cellular or tissue based product was performed by [name], MD with the following instrument(s): Forceps and Scissors. Epicord was applied and secured with Steri-Strips. 1 sq cm of product was utilized and 5 sq cm was wasted due to wound size. Post Application, adaptic, kling was applied. A Time Out was conducted at 11:00, prior to the start of the procedure. The procedure was tolerated well with a pain level of 0 throughout and a Dement, Elek ([id number]) [phone].pdf Page 7 of 9 pain level of 0 following the procedure. Post procedure Diagnosis Wound #1: Same as Pre-Procedure . Plan Follow-up Appointments: Return Appointment in 1 week. - Dr. Lady Gary Rm 1 Friday 5/17 @ 10:30 am Anesthetic: Wound #1 Right,Medial T Great: oe (In clinic) Topical Lidocaine 4% applied to wound bed Cellular or Tissue Based Products: Cellular or Tissue Based Product Type: - epicord #5 Cellular or Tissue Based Product applied to wound bed, secured with steri-strips, cover with Adaptic or Mepitel. (DO NOT REMOVE). Bathing/ Shower/ Hygiene: May  shower with protection but do not get wound dressing(s) wet. Protect dressing(s) with water repellant cover (for example, large plastic bag) or a cast cover and may then take shower. Off-Loading: Wound #1 Right,Medial T Great: oe Other: - Diabetic shoe WOUND #1: - T Great Wound Laterality: Right, Medial oe Cleanser: Soap and Water 1 x Per Week/30 Days Discharge Instructions: May shower and wash wound with dial antibacterial soap and water prior to dressing change. Cleanser: Wound Cleanser 1 x Per Week/30 Days Discharge Instructions: Cleanse the wound with wound cleanser prior to applying a clean dressing using gauze sponges, not  tissue or cotton balls. Prim Dressing: Epicord 1 x Per Week/30 Days ary Secondary Dressing: ADAPTIC TOUCH 3x4.25 in 1 x Per Week/30 Days Discharge Instructions: Apply over primary dressing as directed. Secondary Dressing: Woven Gauze Sponge, Non-Sterile 4x4 in 1 x Per Week/30 Days Discharge Instructions: Apply over primary dressing as directed. Secured With: Insurance underwriter, Sterile 2x75 (in/in) 1 x Per Week/30 Days Discharge Instructions: Secure with stretch gauze as directed. Secured With: 47M Medipore Scientist, research (life sciences) Surgical T 2x10 (in/yd) 1 x Per Week/30 Days ape Discharge Instructions: Secure with tape as directed. 02/09/2023: The wound is quite a bit smaller today and there is robust granulation tissue on the surface. No debridement was necessary today. Epicord ordered was prepared and applied in standard fashion. It was secured with Adaptic and Steri-Strips. The patella was wrapped. We will discontinue hyperbaric oxygen treatment per his request. Follow-up here in 1 week. Electronic Signature(s) Signed: 02/09/2023 11:52:48 AM By: [name] MD FACS Previous Signature: 02/09/2023 11:41:39 AM Version By: [name] MD FACS Entered By: [name] on 02/09/2023  11:52:48 -------------------------------------------------------------------------------- HxROS Details Patient Name: Date of Service: [facility] [name], [name] 02/09/2023 10:30 A M Medical Record Number: [id number] Patient Account Number: [id number] Date of Birth/Sex: Treating RN: 23-Sep-1951 (72 y.o. M) Primary Care Provider: [name] Other Other Clinician: Referring Provider: Treating Provider/Extender: [name] Weeks in Treatment: 17 Information Obtained From Patient Eyes Medical History: Positive for: Cataracts - rt eye Ear/Nose/Mouth/Throat Medical History: Negative for: Chronic sinus problems/congestion Hematologic/Lymphatic Medical History: Negative for: Anemia; Hemophilia; Human Immunodeficiency Virus; Lymphedema; Sickle Cell Disease Forero, Eris ([id number]) [id number].pdf Page 8 of 9 Respiratory Medical History: Negative for: Aspiration; Asthma; Chronic Obstructive Pulmonary Disease (COPD); Pneumothorax; Tuberculosis Cardiovascular Medical History: Positive for: Hypertension Gastrointestinal Medical History: Negative for: Cirrhosis ; Colitis; Crohns; Hepatitis A; Hepatitis B; Hepatitis C Endocrine Medical History: Positive for: Type II Diabetes Time with diabetes: 2000 Treated with: Insulin Blood sugar tested every day: Yes Tested : Genitourinary Medical History: Negative for: End Stage Renal Disease Past Medical History Notes: CKD IV Immunological Medical History: Negative for: Lupus Erythematosus; Raynauds; Scleroderma Integumentary (Skin) Medical History: Negative for: History of Burn Musculoskeletal Medical History: Positive for: Osteomyelitis - MRI confirmed (08/16/22) Negative for: Gout Neurologic Medical History: Positive for: Neuropathy Psychiatric Medical History: Positive for: Confinement Anxiety Negative for: Anorexia/bulimia HBO Extended History Items Eyes: Cataracts Immunizations Pneumococcal  Vaccine: Received Pneumococcal Vaccination: Yes Received Pneumococcal Vaccination On or After 60th Birthday: Yes Implantable Devices None Hospitalization / Surgery History Type of Hospitalization/Surgery AV fistula place left upper arm 10/20/22 Family and Social History Milburn, [name] ([id number]) [id number] Page 9 of 9 Cancer: No; Diabetes: Yes - Mother,Siblings; Heart Disease: Yes - Siblings; Hereditary Spherocytosis: No; Hypertension: Yes - Siblings; Kidney Disease: No; Lung Disease: No; Seizures: Yes - Father; Stroke: Yes - Siblings; Thyroid Problems: No; Tuberculosis: No; Former smoker; Marital Status - Married; Alcohol Use: Rarely; Drug Use: No History; Caffeine Use: Daily; Financial Concerns: No; Food, Clothing or Shelter Needs: No; Support System Lacking: No; Transportation Concerns: No Electronic Signature(s) Signed: 02/09/2023 12:57:31 PM By: [name] MD FACS Entered By: [name] on 02/09/2023 11:39:13 -------------------------------------------------------------------------------- SuperBill Details Patient Name: Date of Service: [facility], [name] 02/09/2023 Medical Record Number: [id number] Patient Account Number: [id number] Date of Birth/Sex: Treating RN: 07/17/1951 (72 y.o. [name] Primary Care Provider: [name] Other Other Clinician: Referring Provider: Treating Provider/Extender: [name] Weeks in Treatment: 17 Diagnosis Coding ICD-10 Codes Code Description L97.514 Non-pressure chronic ulcer  of other 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 E11.621 Type 2 diabetes mellitus with foot ulcer N18.6 End stage renal disease M86.9 Osteomyelitis, unspecified E11.42 Type 2 diabetes mellitus with diabetic polyneuropathy I10 Essential (primary) hypertension Facility Procedures : CPT4 Code: 16109604 Description: Q4187 Epicord 2cm x 3cm - per sqcm Modifier: Quantity: 6 : CPT4 Code: 54098119 Description:  15275 - SKIN SUB GRAFT FACE/NK/HF/G ICD-10 Diagnosis Description L97.514 Non-pressure chronic ulcer of other 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 Modifier: Quantity: 1 Physician Procedures : CPT4 Code Description Modifier [id number] 99213 - WC PHYS LEVEL 3 - EST PT 25 ICD-10 Diagnosis Description L97.514 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.621 Type 2 diabetes mellitus with foot ulcer M86.9  Osteomyelitis, unspecified N18.6 End stage renal disease Quantity: 1 : [id number] 15275 - WC PHYS SKIN SUB GRAFT FACE/NK/HF/G ICD-10 Diagnosis Description L97.514 Non-pressure chronic ulcer of other 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 Quantity: 1 Electronic Signature(s) Signed: 02/09/2023 11:53:10 AM By: [name] MD FACS Entered By: [name] on 02/09/2023 11:53:10

## 2023-02-10 NOTE — Progress Notes (Signed)
[name], [name] ([id number]) [id number] Page 1 of 7 Visit Report for 02/09/2023 Arrival Information Details Patient Name: Date of Service: [place], [name] 02/09/2023 10:30 A M Medical Record Number: [id number] Patient Account Number: [id number] Date of Birth/Sex: Treating RN: 1951-03-02 (72 y.o. [name], [name] Primary Care [name]: [name] Other Other Clinician: Referring Onita Pfluger: Treating Amadeo Coke/Extender: [name] Weeks in Treatment: 17 Visit Information History Since Last Visit Added or deleted any medications: No Patient Arrived: Ambulatory Any new allergies or adverse reactions: No Arrival Time: 10:37 Had a fall or experienced change in No Accompanied By: spouse activities of daily living that may affect Transfer Assistance: None risk of falls: Patient Identification Verified: Yes Signs or symptoms of abuse/neglect since last visito No Secondary Verification Process Completed: Yes Hospitalized since last visit: No Patient Requires Transmission-Based Precautions: No Implantable device outside of the clinic excluding No Patient Has Alerts: No cellular tissue based products placed in the center since last visit: Has Dressing in Place as Prescribed: Yes Pain Present Now: No Electronic Signature(s) Signed: 02/09/2023 4:28:56 PM By: Zenaida Deed RN, BSN Entered By: Zenaida Deed on 02/09/2023 10:38:10 -------------------------------------------------------------------------------- Encounter Discharge Information Details Patient Name: Date of Service: [facility], [name] 02/09/2023 10:30 A M Medical Record Number: [id number] Patient Account Number: [id number] Date of Birth/Sex: Treating RN: 1950-11-04 (72 y.o. [name] Primary Care Isham Smitherman: [name] Other Other Clinician: Referring Jayjay Littles: Treating Madicyn Mesina/Extender: [name] Weeks in Treatment: 17 Encounter Discharge Information Items Post  Procedure Vitals Discharge Condition: Stable Temperature (F): 98.4 Ambulatory Status: Ambulatory Pulse (bpm): 118 Discharge Destination: Home Respiratory Rate (breaths/min): 18 Transportation: Private Auto Blood Pressure (mmHg): 153/73 Accompanied By: spouse Schedule Follow-up Appointment: Yes Clinical Summary of Care: Patient Declined Electronic Signature(s) Signed: 02/09/2023 4:28:56 PM By: Zenaida Deed RN, BSN Entered By: Zenaida Deed on 02/09/2023 11:11:11 -------------------------------------------------------------------------------- Lower Extremity Assessment Details Patient Name: Date of Service: [name], [name] 02/09/2023 10:30 A M Medical Record Number: [id number] Patient Account Number: [id number] Date of Birth/Sex: Treating RN: May 08, 1951 (72 y.o. [name] Primary Care Mashanda Ishibashi: [name] Other Other Clinician: Referring [name]: Treating [name]/Extender: [name] Weeks in Treatment: 17 Edema Assessment Assessed: [Left: No] [Right: No] T[LeftJAMESDEAN, [name] ([id number])] [Right: [id number] Page 2 of 7] Edema: [Left: N] [Right: o] Calf Left: Right: Point of Measurement: From Medial Instep 36 cm Ankle Left: Right: Point of Measurement: From Medial Instep 22 cm Vascular Assessment Pulses: Dorsalis Pedis Palpable: [Right:Yes] Electronic Signature(s) Signed: 02/09/2023 4:28:56 PM By: Zenaida Deed RN, BSN Entered By: Zenaida Deed on 02/09/2023 10:43:24 -------------------------------------------------------------------------------- Multi Wound Chart Details Patient Name: Date of Service: [facility], [name] 02/09/2023 10:30 A M Medical Record Number: [id number] Patient Account Number: [id number] Date of Birth/Sex: Treating RN: [birth year] (72 y.o. M) Primary Care [name]: [name] Other Other Clinician: Referring Sarabelle Genson: Treating [name]/Extender: [name] Weeks in Treatment:  17 Vital Signs Height(in): 72 Pulse(bpm): 118 Weight(lbs): 185 Blood Pressure(mmHg): 153/73 Body Mass Index(BMI): 25.1 Temperature(F): 98.4 Respiratory Rate(breaths/min): 18 [1:Photos:] [N/A:N/A] Right, Medial T Great oe N/A N/A Wound Location: Pressure Injury N/A N/A Wounding Event: Diabetic Wound/Ulcer of the Lower N/A N/A Primary Etiology: Extremity Acute Osteomyelitis N/A N/A Secondary Etiology: Cataracts, Hypertension, Type II N/A N/A Comorbid History: Diabetes, Osteomyelitis, Neuropathy, Confinement Anxiety 08/16/2022 N/A N/A Date Acquired: 17 N/A N/A Weeks of Treatment: Open N/A N/A Wound Status: No N/A N/A Wound Recurrence: 0.6x0.5x0.2 N/A N/A Measurements L x W x D (cm) 0.236 N/A N/A A (cm) : rea  0.047 N/A N/A Volume (cm) : 14.20% N/A N/A % Reduction in A rea: 57.30% N/A N/A % Reduction in Volume: Grade 3 N/A N/A Classification: Small N/A N/A Exudate A mount: Serosanguineous N/A N/A Exudate Type: red, brown N/A N/A Exudate Color: Distinct, outline attached N/A N/A Wound Margin: Large (67-100%) N/A N/A Granulation A mount: Red N/A N/A Granulation Quality: Small (1-33%) N/A N/A Necrotic A mountJETT, DIVENS ([id number]) [id number] Page 3 of 7 Fat Layer (Subcutaneous Tissue): Yes N/A N/A Exposed Structures: Tendon: Yes Bone: Yes Fascia: No Muscle: No Joint: No Small (1-33%) N/A N/A Epithelialization: Callus: Yes N/A N/A Periwound Skin Texture: Maceration: Yes N/A N/A Periwound Skin Moisture: No Abnormalities Noted N/A N/A Periwound Skin Color: No Abnormality N/A N/A Temperature: Cellular or Tissue Based Product N/A N/A Procedures Performed: Treatment Notes Wound #1 (Toe Great) Wound Laterality: Right, Medial Cleanser Soap and Water Discharge Instruction: May shower and wash wound with dial antibacterial soap and water prior to dressing change. Wound Cleanser Discharge Instruction: Cleanse the  wound with wound cleanser prior to applying a clean dressing using gauze sponges, not tissue or cotton balls. Peri-Wound Care Topical Primary Dressing Epicord Secondary Dressing ADAPTIC TOUCH 3x4.25 in Discharge Instruction: Apply over primary dressing as directed. Woven Gauze Sponge, Non-Sterile 4x4 in Discharge Instruction: Apply over primary dressing as directed. Secured With Conforming Stretch Gauze Bandage, Sterile 2x75 (in/in) Discharge Instruction: Secure with stretch gauze as directed. 34M Medipore Soft Cloth Surgical T 2x10 (in/yd) ape Discharge Instruction: Secure with tape as directed. Compression Wrap Compression Stockings Add-Ons Electronic Signature(s) Signed: 02/09/2023 11:36:53 AM By: [name] MD FACS Entered By: [name] on 02/09/2023 11:36:52 -------------------------------------------------------------------------------- Multi-Disciplinary Care Plan Details Patient Name: Date of Service: [facility], [name] 02/09/2023 10:30 A M Medical Record Number: [id number] Patient Account Number: [id number] Date of Birth/Sex: Treating RN: Feb 05, 1951 (72 y.o. [name] Primary Care Analiah Drum: [name] Other Other Clinician: Referring Arthea Nobel: Treating Carlee Tesfaye/Extender: [name] Weeks in Treatment: 17 Multidisciplinary Care Plan reviewed with physician Active Inactive Nutrition Nursing Diagnoses: Impaired glucose control: actual or potential GoalsCHANLER, ARTIS ([id number]) [id number] Page 4 of 7 Patient/caregiver will maintain therapeutic glucose control Date Initiated: 10/12/2022 Target Resolution Date: 02/22/2023 Goal Status: Active Interventions: Provide education on elevated blood sugars and impact on wound healing Provide education on nutrition Treatment Activities: Dietary management education, guidance and counseling : 10/12/2022 Giving encouragement to exercise :  10/12/2022 Notes: Osteomyelitis Nursing Diagnoses: Infection: osteomyelitis Goals: Patient/caregiver will verbalize understanding of disease process and disease management Date Initiated: 10/12/2022 Target Resolution Date: 02/22/2023 Goal Status: Active Interventions: Assess for signs and symptoms of osteomyelitis resolution every visit Provide education on osteomyelitis Treatment Activities: MRI : 08/16/2022 Notes: Wound/Skin Impairment Nursing Diagnoses: Impaired tissue integrity Goals: Patient/caregiver will verbalize understanding of skin care regimen Date Initiated: 01/26/2023 Date Inactivated: 02/02/2023 Target Resolution Date: 02/22/2023 Goal Status: Met Ulcer/skin breakdown will have a volume reduction of 30% by week 4 Date Initiated: 10/12/2022 Date Inactivated: 01/26/2023 Target Resolution Date: 01/25/2023 Goal Status: Met Ulcer/skin breakdown will have a volume reduction of 50% by week 8 Date Initiated: 01/26/2023 Target Resolution Date: 02/22/2023 Goal Status: Active Interventions: Assess ulceration(s) every visit Provide education on smoking Treatment Activities: Skin care regimen initiated : 10/12/2022 Smoking cessation education : 10/12/2022 Notes: Electronic Signature(s) Signed: 02/09/2023 4:28:56 PM By: Zenaida Deed RN, BSN Entered By: Zenaida Deed on 02/09/2023 10:46:17 -------------------------------------------------------------------------------- Pain Assessment Details Patient Name: Date of Service: [name], [name] 02/09/2023 10:30 A M Medical Record Number: [id number]  Patient Account Number: [id number] Date of Birth/Sex: Treating RN: March 09, 1951 (72 y.o. [name] Primary Care Leveon Pelzer: [name] Other Other Clinician: Referring Jenaya Saar: Treating Marcin Holte/Extender: [name] Weeks in Treatment: [address] [place], [place] ([id number]) [id number] Page 5 of 7 Location of Pain Severity and  Description of Pain Patient Has Paino No Site Locations Rate the pain. Current Pain Level: 0 Pain Management and Medication Current Pain Management: Electronic Signature(s) Signed: 02/09/2023 4:28:56 PM By: Zenaida Deed RN, BSN Entered By: Zenaida Deed on 02/09/2023 10:39:28 -------------------------------------------------------------------------------- Patient/Caregiver Education Details Patient Name: Date of Service: [name], [name] 5/10/2024andnbsp10:30 A M Medical Record Number: [id number] Patient Account Number: [id number] Date of Birth/Gender: Treating RN: Apr 08, 1951 (72 y.o. [name] Primary Care Physician: [name] Other Other Clinician: Referring Physician: Treating Physician/Extender: Gracy Racer in Treatment: 17 Education Assessment Education Provided To: Patient Education Topics Provided Elevated Blood Sugar/ Impact on Healing: Methods: Explain/Verbal Responses: Reinforcements needed, State content correctly Wound/Skin Impairment: Methods: Explain/Verbal Responses: Reinforcements needed, State content correctly Electronic Signature(s) Signed: 02/09/2023 4:28:56 PM By: Zenaida Deed RN, BSN Entered By: Zenaida Deed on 02/09/2023 10:46:40 -------------------------------------------------------------------------------- Wound Assessment Details Patient Name: Date of Service: [facility], [name] 02/09/2023 10:30 A M Medical Record Number: [id number] Patient Account Number: [id number] Date of Birth/Sex: Treating RN: 05-08-1951 (72 y.o. [name] Primary Care Jalaya Sarver: [name] Other Other Clinician: Referring Cherylynn Liszewski: Treating Willis Holquin/Extender: [name] Weeks in Treatment: [address] [name] ([id number]) [id number] Page 6 of 7 Wound Status Wound Number: 1 Primary Diabetic Wound/Ulcer of the Lower Extremity Etiology: Wound Location: Right, Medial T Great oe Secondary Acute  Osteomyelitis Wounding Event: Pressure Injury Etiology: Date Acquired: 08/16/2022 Wound Open Weeks Of Treatment: 17 Status: Clustered Wound: No Comorbid Cataracts, Hypertension, Type II Diabetes, Osteomyelitis, History: Neuropathy, Confinement Anxiety Photos Wound Measurements Length: (cm) 0.6 Width: (cm) 0.5 Depth: (cm) 0.2 Area: (cm) 0.236 Volume: (cm) 0.047 % Reduction in Area: 14.2% % Reduction in Volume: 57.3% Epithelialization: Small (1-33%) Tunneling: No Undermining: No Wound Description Classification: Grade 3 Wound Margin: Distinct, outline attached Exudate Amount: Small Exudate Type: Serosanguineous Exudate Color: red, brown Foul Odor After Cleansing: No Slough/Fibrino Yes Wound Bed Granulation Amount: Large (67-100%) Exposed Structure Granulation Quality: Red Fascia Exposed: No Necrotic Amount: Small (1-33%) Fat Layer (Subcutaneous Tissue) Exposed: Yes Necrotic Quality: Adherent Slough Tendon Exposed: Yes Muscle Exposed: No Joint Exposed: No Bone Exposed: Yes Periwound Skin Texture Texture Color No Abnormalities Noted: Yes No Abnormalities Noted: Yes Moisture Temperature / Pain No Abnormalities Noted: Yes Temperature: No Abnormality Treatment Notes Wound #1 (Toe Great) Wound Laterality: Right, Medial Cleanser Soap and Water Discharge Instruction: May shower and wash wound with dial antibacterial soap and water prior to dressing change. Wound Cleanser Discharge Instruction: Cleanse the wound with wound cleanser prior to applying a clean dressing using gauze sponges, not tissue or cotton balls. Peri-Wound Care Topical Primary Dressing Epicord Secondary Dressing ADAPTIC TOUCH 3x4.25 in Discharge Instruction: Apply over primary dressing as directed. Woven Gauze Sponge, Non-Sterile 4x4 in Stuckey, Josmar ([id number]) [phone].pdf Page 7 of 7 Discharge Instruction: Apply over primary dressing as directed. Secured  With Conforming Stretch Gauze Bandage, Sterile 2x75 (in/in) Discharge Instruction: Secure with stretch gauze as directed. 38M Medipore Soft Cloth Surgical T 2x10 (in/yd) ape Discharge Instruction: Secure with tape as directed. Compression Wrap Compression Stockings Add-Ons Electronic Signature(s) Signed: 02/09/2023 4:28:56 PM By: Zenaida Deed RN, BSN Entered By: Zenaida Deed on 02/09/2023 10:45:21 -------------------------------------------------------------------------------- Vitals Details Patient Name: Date of Service:  [name], [name] 02/09/2023 10:30 A M Medical Record Number: [id number] Patient Account Number: [id number] Date of Birth/Sex: Treating RN: May 06, 1951 (72 y.o. [name] Primary Care Hakeen Shipes: [name] Other Other Clinician: Referring Ean Gettel: Treating Natilie Krabbenhoft/Extender: [name] Weeks in Treatment: 17 Vital Signs Time Taken: 10:39 Temperature (F): 98.4 Height (in): 72 Pulse (bpm): 118 Weight (lbs): 185 Respiratory Rate (breaths/min): 18 Body Mass Index (BMI): 25.1 Blood Pressure (mmHg): 153/73 Reference Range: 80 - 120 mg / dl Notes pt did not check blood sugar today Electronic Signature(s) Signed: 02/09/2023 4:28:56 PM By: Zenaida Deed RN, BSN Entered By: Zenaida Deed on 02/09/2023 10:39:20

## 2023-02-13 ENCOUNTER — Ambulatory Visit (INDEPENDENT_AMBULATORY_CARE_PROVIDER_SITE_OTHER): Payer: Medicare PPO | Admitting: Nurse Practitioner

## 2023-02-13 ENCOUNTER — Ambulatory Visit (INDEPENDENT_AMBULATORY_CARE_PROVIDER_SITE_OTHER): Payer: Medicare PPO

## 2023-02-13 VITALS — BP 117/55 | HR 95 | Resp 18 | Ht 72.0 in | Wt 186.6 lb

## 2023-02-13 DIAGNOSIS — E782 Mixed hyperlipidemia: Secondary | ICD-10-CM | POA: Diagnosis not present

## 2023-02-13 DIAGNOSIS — N186 End stage renal disease: Secondary | ICD-10-CM

## 2023-02-13 DIAGNOSIS — E1142 Type 2 diabetes mellitus with diabetic polyneuropathy: Secondary | ICD-10-CM | POA: Diagnosis not present

## 2023-02-13 DIAGNOSIS — Z992 Dependence on renal dialysis: Secondary | ICD-10-CM | POA: Diagnosis not present

## 2023-02-15 ENCOUNTER — Encounter (INDEPENDENT_AMBULATORY_CARE_PROVIDER_SITE_OTHER): Payer: Self-pay | Admitting: Nurse Practitioner

## 2023-02-15 DIAGNOSIS — Z992 Dependence on renal dialysis: Secondary | ICD-10-CM | POA: Diagnosis not present

## 2023-02-15 DIAGNOSIS — N186 End stage renal disease: Secondary | ICD-10-CM | POA: Diagnosis not present

## 2023-02-15 NOTE — Progress Notes (Signed)
Subjective:    Patient ID: [name], male    DOB: Jul 24, 1951, 72 y.o.   MRN: [id number] Chief Complaint  Patient presents with   Follow-up    The patient returns to the office for followup of their dialysis access.   The patient reports the function of the access has been stable. Patient denies difficulty with cannulation. The patient denies increased bleeding time after removing the needles. The patient denies hand pain or other symptoms consistent with steal phenomena.  No significant arm swelling.  The patient had the access created On 10/20/2022  The patient denies redness or swelling at the access site. The patient denies fever or chills at home or while on dialysis.  No recent shortening of the patient's walking distance or new symptoms consistent with claudication.  No history of rest pain symptoms. No new ulcers or wounds of the lower extremities have occurred.  The patient denies amaurosis fugax or recent TIA symptoms. There are no recent neurological changes noted. There is no history of DVT, PE or superficial thrombophlebitis. No recent episodes of angina or shortness of breath documented.   Duplex ultrasound of the AV access shows a patent access.  Today the patient's flow volume is 2687.  It was previously 1860.  The patient had some noted stenosis at the anastomosis sites and that seems to have increased especially near the arterial anastomosis.    Review of Systems  All other systems reviewed and are negative.      Objective:   Physical Exam Vitals reviewed.  HENT:     Head: Normocephalic.  Cardiovascular:     Rate and Rhythm: Normal rate.     Pulses:          Radial pulses are 2+ on the right side and 2+ on the left side.     Arteriovenous access: Left arteriovenous access is present.    Comments: Good thrill and bruit Pulmonary:     Effort: Pulmonary effort is normal.  Skin:    General: Skin is warm and dry.  Neurological:     Mental Status: He is  alert and oriented to person, place, and time.  Psychiatric:        Mood and Affect: Mood normal.        Behavior: Behavior normal.        Thought Content: Thought content normal.        Judgment: Judgment normal.     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)    Diabetic neuropathy (HCC)    Diabetic ulcer of foot associated with diabetes mellitus due to underlying condition, limited to breakdown of skin (HCC)    left   GERD (gastroesophageal reflux disease)    Hyperlipidemia    Hypertension    Non compliance w medication regimen    Osteomyelitis (HCC) 08/2022   great toe of right foot   Osteomyelitis of left foot (HCC)    Secondary hyperparathyroidism of renal origin [name])    per nephrology lov dr Thedore Mins 05-22-2022   Stroke [facility]) 2016   no deficits    Social History   Socioeconomic History  Marital status: Married    Spouse name: [name]   Number of children: Not on file   Years of education: Not on file   Highest education level: Not on file  Occupational History   Not on file  Tobacco Use   Smoking status: Former   Smokeless tobacco: Never  Vaping Use   Vaping Use: Never used  Substance and Sexual Activity   Alcohol use: Not Currently   Drug use: Never   Sexual activity: Not on file  Other Topics Concern   Not on file  Social History Narrative   Not on file   Social Determinants of Health   Financial Resource Strain: Not on file  Food Insecurity: Not on file  Transportation Needs: No Transportation Needs (08/17/2022)   PRAPARE - Administrator, Civil Service (Medical): No    Lack of Transportation (Non-Medical): No  Physical Activity: Not on file  Stress: Not on file  Social Connections:  Not on file  Intimate Partner Violence: Not on file    Past Surgical History:  Procedure Laterality Date   AMPUTATION OF REPLICATED TOES Left 2018   left big toe, second toe   APPENDECTOMY     AV FISTULA PLACEMENT Left 10/20/2022   Procedure: INSERTION OF ARTERIOVENOUS (AV) GORE-TEX GRAFT ARM ( BRACHIAL AXILLARY);  Surgeon: Renford Dills, MD;  Location: [facility];  Service: Vascular;  Laterality: Left;   BIOPSY  08/10/2020   Procedure: BIOPSY;  Surgeon: Dolores Frame, MD;  Location: [facility];  Service: Gastroenterology;;   CHOLECYSTECTOMY     COLON SURGERY     blockage   COLONOSCOPY WITH PROPOFOL N/A 08/10/2020   Procedure: COLONOSCOPY WITH PROPOFOL;  Surgeon: Dolores Frame, MD;  Location: [facility];  Service: Gastroenterology;  Laterality: N/A;  815   GRAFT APPLICATION Left 12/08/2019   Procedure: GRAFT APPLICATION;  Surgeon: Asencion Islam, DPM;  Location: [facility];  Service: Podiatry;  Laterality: Left;   HERNIA REPAIR     I & D EXTREMITY Left 10/10/2021   Procedure: IRRIGATION AND DEBRIDEMENT left foot;  Surgeon: Park Liter, DPM;  Location: [facility];  Service: Podiatry;  Laterality: Left;   I & D EXTREMITY Right 08/19/2022   Procedure: IRRIGATION AND DEBRIDEMENT RIGHT GREAT TOE GRAFT APPLICATION WITH BIOPSY;  Surgeon: Edwin Cap, DPM;  Location: [facility];  Service: Podiatry;  Laterality: Right;   IRRIGATION AND DEBRIDEMENT FOOT Left 10/16/2021   Procedure: IRRIGATION AND DEBRIDEMENT FOOT;  Surgeon: Asencion Islam, DPM;  Location: [facility];  Service: Podiatry;  Laterality: Left;  Abscess left heel   METATARSAL HEAD EXCISION Left 12/08/2019   Procedure: METATARSAL HEAD RECESSION THIRD LEFT;  Surgeon: Asencion Islam, DPM;  Location: [facility];  Service: Podiatry;  Laterality: Left;   POLYPECTOMY  08/10/2020   Procedure: POLYPECTOMY;  Surgeon: Dolores Frame, MD;  Location: [facility];  Service:  Gastroenterology;;   WOUND DEBRIDEMENT Left 12/08/2019   Procedure: DEBRIDEMENT WOUND;  Surgeon: Asencion Islam, DPM;  Location: [facility];  Service: Podiatry;  Laterality: Left;   WOUND DEBRIDEMENT Left 10/12/2021   Procedure: LEFT FOOT WOUND DEBRIDEMENT AND IRRIGATION, 1ST METATARSAL RESECTION;  Surgeon: Park Liter, DPM;  Location: [facility];  Service: Podiatry;  Laterality: Left;    History reviewed. No pertinent family history.  Allergies  Allergen Reactions   Losartan Potassium Other (See Comments)    hyperkalemia   Sildenafil Other (See Comments)  Not effective. "Did not like the way it made me feel"       Latest Ref Rng & Units 10/20/2022    6:43 AM 10/18/2022    1:54 PM 10/05/2022    9:46 AM  CBC  WBC 4.0 - 10.5 K/uL  9.4  8.6   Hemoglobin 13.0 - 17.0 g/dL 16.1  09.6  04.5   Hematocrit 39.0 - 52.0 % 34.0  35.7  38.8   Platelets 150 - 400 K/uL  210  203       CMP     Component Value Date/Time   NA 144 10/20/2022 0643   K 4.9 10/20/2022 0643   CL 114 (H) 10/20/2022 0643   CO2 23 10/18/2022 1354   GLUCOSE 160 (H) 10/20/2022 0643   BUN 35 (H) 10/20/2022 0643   CREATININE 4.80 (H) 10/20/2022 0643   CREATININE 4.39 (H) 10/05/2022 0946   CALCIUM 8.9 10/18/2022 1354   PROT 7.2 10/05/2022 0946   ALBUMIN 3.3 (L) 08/16/2022 2146   AST 32 10/05/2022 0946   ALT 90 (H) 10/05/2022 0946   ALKPHOS 68 08/16/2022 2146   BILITOT 0.3 10/05/2022 0946   GFRNONAA 16 (L) 10/18/2022 1354     No results found.     Assessment & Plan:   1. ESRD (end stage renal disease) (HCC) Recommend:  The patient is doing well and currently has adequate dialysis access.  Although there are some parameters suggesting possible future issues.  Will maintain close follow-up.   The patient will follow-up with me in the office in 3 months.  The need for a follow up duplex ultrasound will be made at that time based on whether problems with the access are persistent.    2.  Type 2 diabetes mellitus with peripheral neuropathy (HCC) Continue hypoglycemic medications as already ordered, these medications have been reviewed and there are no changes at this time.  Hgb A1C to be monitored as already arranged by primary service  3. Hyperlipidemia, mixed Continue statin as ordered and reviewed, no changes at this time   Current Outpatient Medications on File Prior to Visit  Medication Sig Dispense Refill   amLODipine (NORVASC) 10 MG tablet Take 10 mg by mouth daily in the afternoon.     aspirin EC 81 MG tablet Take 81 mg by mouth daily. Swallow whole.     atorvastatin (LIPITOR) 80 MG tablet Take 1 tablet (80 mg total) by mouth daily in the afternoon. 90 tablet 1   calcitRIOL (ROCALTROL) 0.25 MCG capsule Take 0.25 mcg by mouth daily in the afternoon.     Continuous Blood Gluc Sensor (FREESTYLE LIBRE 2 SENSOR) MISC USE TO CHECK BLOOD GLUCOSE CONTINUOUSLY. CHANGE SENSOR EVERY 14 DAYS 6 each 3   docusate sodium (COLACE) 100 MG capsule Take 100 mg by mouth daily.     Dulaglutide 4.5 MG/0.5ML SOPN Inject 4.5 mg into the skin every Sunday.     NOVOLOG MIX 70/30 FLEXPEN (70-30) 100 UNIT/ML FlexPen Inject 20 Units into the skin 2 (two) times daily with a meal.     torsemide (DEMADEX) 20 MG tablet Take 20 mg by mouth daily in the afternoon.     No current facility-administered medications on file prior to visit.    There are no Patient Instructions on file for this visit. No follow-ups on file.   Georgiana Spinner, NP

## 2023-02-16 ENCOUNTER — Encounter (HOSPITAL_BASED_OUTPATIENT_CLINIC_OR_DEPARTMENT_OTHER): Payer: Medicare PPO | Admitting: General Surgery

## 2023-02-16 DIAGNOSIS — L97516 Non-pressure chronic ulcer of other part of right foot with bone involvement without evidence of necrosis: Secondary | ICD-10-CM | POA: Diagnosis not present

## 2023-02-16 DIAGNOSIS — E11621 Type 2 diabetes mellitus with foot ulcer: Secondary | ICD-10-CM | POA: Diagnosis not present

## 2023-02-16 DIAGNOSIS — E1142 Type 2 diabetes mellitus with diabetic polyneuropathy: Secondary | ICD-10-CM | POA: Diagnosis not present

## 2023-02-16 DIAGNOSIS — N186 End stage renal disease: Secondary | ICD-10-CM | POA: Diagnosis not present

## 2023-02-16 DIAGNOSIS — I120 Hypertensive chronic kidney disease with stage 5 chronic kidney disease or end stage renal disease: Secondary | ICD-10-CM | POA: Diagnosis not present

## 2023-02-16 DIAGNOSIS — L97514 Non-pressure chronic ulcer of other 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: Secondary | ICD-10-CM | POA: Diagnosis not present

## 2023-02-16 DIAGNOSIS — E1122 Type 2 diabetes mellitus with diabetic chronic kidney disease: Secondary | ICD-10-CM | POA: Diagnosis not present

## 2023-02-16 DIAGNOSIS — Z992 Dependence on renal dialysis: Secondary | ICD-10-CM | POA: Diagnosis not present

## 2023-02-17 DIAGNOSIS — N186 End stage renal disease: Secondary | ICD-10-CM | POA: Diagnosis not present

## 2023-02-17 DIAGNOSIS — Z992 Dependence on renal dialysis: Secondary | ICD-10-CM | POA: Diagnosis not present

## 2023-02-20 DIAGNOSIS — Z992 Dependence on renal dialysis: Secondary | ICD-10-CM | POA: Diagnosis not present

## 2023-02-20 DIAGNOSIS — N186 End stage renal disease: Secondary | ICD-10-CM | POA: Diagnosis not present

## 2023-02-22 ENCOUNTER — Encounter (HOSPITAL_BASED_OUTPATIENT_CLINIC_OR_DEPARTMENT_OTHER): Payer: Medicare PPO | Admitting: General Surgery

## 2023-02-22 DIAGNOSIS — Z992 Dependence on renal dialysis: Secondary | ICD-10-CM | POA: Diagnosis not present

## 2023-02-22 DIAGNOSIS — N186 End stage renal disease: Secondary | ICD-10-CM | POA: Diagnosis not present

## 2023-02-22 DIAGNOSIS — E1122 Type 2 diabetes mellitus with diabetic chronic kidney disease: Secondary | ICD-10-CM | POA: Diagnosis not present

## 2023-02-22 DIAGNOSIS — L97512 Non-pressure chronic ulcer of other part of right foot with fat layer exposed: Secondary | ICD-10-CM | POA: Diagnosis not present

## 2023-02-22 DIAGNOSIS — E11621 Type 2 diabetes mellitus with foot ulcer: Secondary | ICD-10-CM | POA: Diagnosis not present

## 2023-02-22 DIAGNOSIS — E1142 Type 2 diabetes mellitus with diabetic polyneuropathy: Secondary | ICD-10-CM | POA: Diagnosis not present

## 2023-02-22 DIAGNOSIS — L97514 Non-pressure chronic ulcer of other 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: Secondary | ICD-10-CM | POA: Diagnosis not present

## 2023-02-22 DIAGNOSIS — I120 Hypertensive chronic kidney disease with stage 5 chronic kidney disease or end stage renal disease: Secondary | ICD-10-CM | POA: Diagnosis not present

## 2023-02-24 DIAGNOSIS — Z992 Dependence on renal dialysis: Secondary | ICD-10-CM | POA: Diagnosis not present

## 2023-02-24 DIAGNOSIS — N186 End stage renal disease: Secondary | ICD-10-CM | POA: Diagnosis not present

## 2023-02-27 DIAGNOSIS — N186 End stage renal disease: Secondary | ICD-10-CM | POA: Diagnosis not present

## 2023-02-27 DIAGNOSIS — Z992 Dependence on renal dialysis: Secondary | ICD-10-CM | POA: Diagnosis not present

## 2023-03-01 DIAGNOSIS — Z992 Dependence on renal dialysis: Secondary | ICD-10-CM | POA: Diagnosis not present

## 2023-03-01 DIAGNOSIS — N186 End stage renal disease: Secondary | ICD-10-CM | POA: Diagnosis not present

## 2023-03-02 ENCOUNTER — Encounter (HOSPITAL_BASED_OUTPATIENT_CLINIC_OR_DEPARTMENT_OTHER): Payer: Medicare PPO | Admitting: General Surgery

## 2023-03-02 DIAGNOSIS — E1142 Type 2 diabetes mellitus with diabetic polyneuropathy: Secondary | ICD-10-CM | POA: Diagnosis not present

## 2023-03-02 DIAGNOSIS — E11621 Type 2 diabetes mellitus with foot ulcer: Secondary | ICD-10-CM | POA: Diagnosis not present

## 2023-03-02 DIAGNOSIS — L97514 Non-pressure chronic ulcer of other 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: Secondary | ICD-10-CM | POA: Diagnosis not present

## 2023-03-02 DIAGNOSIS — Z992 Dependence on renal dialysis: Secondary | ICD-10-CM | POA: Diagnosis not present

## 2023-03-02 DIAGNOSIS — I120 Hypertensive chronic kidney disease with stage 5 chronic kidney disease or end stage renal disease: Secondary | ICD-10-CM | POA: Diagnosis not present

## 2023-03-02 DIAGNOSIS — E1122 Type 2 diabetes mellitus with diabetic chronic kidney disease: Secondary | ICD-10-CM | POA: Diagnosis not present

## 2023-03-02 DIAGNOSIS — N186 End stage renal disease: Secondary | ICD-10-CM | POA: Diagnosis not present

## 2023-03-03 DIAGNOSIS — N186 End stage renal disease: Secondary | ICD-10-CM | POA: Diagnosis not present

## 2023-03-03 DIAGNOSIS — N2581 Secondary hyperparathyroidism of renal origin: Secondary | ICD-10-CM | POA: Diagnosis not present

## 2023-03-03 DIAGNOSIS — Z992 Dependence on renal dialysis: Secondary | ICD-10-CM | POA: Diagnosis not present

## 2023-03-06 DIAGNOSIS — N2581 Secondary hyperparathyroidism of renal origin: Secondary | ICD-10-CM | POA: Diagnosis not present

## 2023-03-06 DIAGNOSIS — Z992 Dependence on renal dialysis: Secondary | ICD-10-CM | POA: Diagnosis not present

## 2023-03-06 DIAGNOSIS — N186 End stage renal disease: Secondary | ICD-10-CM | POA: Diagnosis not present

## 2023-03-08 DIAGNOSIS — N2581 Secondary hyperparathyroidism of renal origin: Secondary | ICD-10-CM | POA: Diagnosis not present

## 2023-03-08 DIAGNOSIS — N186 End stage renal disease: Secondary | ICD-10-CM | POA: Diagnosis not present

## 2023-03-08 DIAGNOSIS — Z992 Dependence on renal dialysis: Secondary | ICD-10-CM | POA: Diagnosis not present

## 2023-03-09 ENCOUNTER — Encounter (HOSPITAL_BASED_OUTPATIENT_CLINIC_OR_DEPARTMENT_OTHER): Payer: Medicare PPO | Attending: General Surgery | Admitting: General Surgery

## 2023-03-09 DIAGNOSIS — L97514 Non-pressure chronic ulcer of other 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: Secondary | ICD-10-CM | POA: Insufficient documentation

## 2023-03-09 DIAGNOSIS — Z833 Family history of diabetes mellitus: Secondary | ICD-10-CM | POA: Insufficient documentation

## 2023-03-09 DIAGNOSIS — E1122 Type 2 diabetes mellitus with diabetic chronic kidney disease: Secondary | ICD-10-CM | POA: Insufficient documentation

## 2023-03-09 DIAGNOSIS — E11621 Type 2 diabetes mellitus with foot ulcer: Secondary | ICD-10-CM | POA: Insufficient documentation

## 2023-03-09 DIAGNOSIS — Z992 Dependence on renal dialysis: Secondary | ICD-10-CM | POA: Insufficient documentation

## 2023-03-09 DIAGNOSIS — L97516 Non-pressure chronic ulcer of other part of right foot with bone involvement without evidence of necrosis: Secondary | ICD-10-CM | POA: Diagnosis not present

## 2023-03-09 DIAGNOSIS — F419 Anxiety disorder, unspecified: Secondary | ICD-10-CM | POA: Insufficient documentation

## 2023-03-09 DIAGNOSIS — E1151 Type 2 diabetes mellitus with diabetic peripheral angiopathy without gangrene: Secondary | ICD-10-CM | POA: Diagnosis not present

## 2023-03-09 DIAGNOSIS — N186 End stage renal disease: Secondary | ICD-10-CM | POA: Insufficient documentation

## 2023-03-09 DIAGNOSIS — E1142 Type 2 diabetes mellitus with diabetic polyneuropathy: Secondary | ICD-10-CM | POA: Insufficient documentation

## 2023-03-09 DIAGNOSIS — I120 Hypertensive chronic kidney disease with stage 5 chronic kidney disease or end stage renal disease: Secondary | ICD-10-CM | POA: Insufficient documentation

## 2023-03-10 DIAGNOSIS — N2581 Secondary hyperparathyroidism of renal origin: Secondary | ICD-10-CM | POA: Diagnosis not present

## 2023-03-10 DIAGNOSIS — Z992 Dependence on renal dialysis: Secondary | ICD-10-CM | POA: Diagnosis not present

## 2023-03-10 DIAGNOSIS — N186 End stage renal disease: Secondary | ICD-10-CM | POA: Diagnosis not present

## 2023-03-13 DIAGNOSIS — N2581 Secondary hyperparathyroidism of renal origin: Secondary | ICD-10-CM | POA: Diagnosis not present

## 2023-03-13 DIAGNOSIS — Z992 Dependence on renal dialysis: Secondary | ICD-10-CM | POA: Diagnosis not present

## 2023-03-13 DIAGNOSIS — N186 End stage renal disease: Secondary | ICD-10-CM | POA: Diagnosis not present

## 2023-03-15 DIAGNOSIS — N2581 Secondary hyperparathyroidism of renal origin: Secondary | ICD-10-CM | POA: Diagnosis not present

## 2023-03-15 DIAGNOSIS — N186 End stage renal disease: Secondary | ICD-10-CM | POA: Diagnosis not present

## 2023-03-15 DIAGNOSIS — Z992 Dependence on renal dialysis: Secondary | ICD-10-CM | POA: Diagnosis not present

## 2023-03-16 ENCOUNTER — Encounter (HOSPITAL_BASED_OUTPATIENT_CLINIC_OR_DEPARTMENT_OTHER): Payer: Medicare PPO | Admitting: General Surgery

## 2023-03-16 DIAGNOSIS — E11621 Type 2 diabetes mellitus with foot ulcer: Secondary | ICD-10-CM | POA: Diagnosis not present

## 2023-03-16 DIAGNOSIS — N186 End stage renal disease: Secondary | ICD-10-CM | POA: Diagnosis not present

## 2023-03-16 DIAGNOSIS — Z992 Dependence on renal dialysis: Secondary | ICD-10-CM | POA: Diagnosis not present

## 2023-03-16 DIAGNOSIS — L97516 Non-pressure chronic ulcer of other part of right foot with bone involvement without evidence of necrosis: Secondary | ICD-10-CM | POA: Diagnosis not present

## 2023-03-16 DIAGNOSIS — E1142 Type 2 diabetes mellitus with diabetic polyneuropathy: Secondary | ICD-10-CM | POA: Diagnosis not present

## 2023-03-16 DIAGNOSIS — E1122 Type 2 diabetes mellitus with diabetic chronic kidney disease: Secondary | ICD-10-CM | POA: Diagnosis not present

## 2023-03-16 DIAGNOSIS — I120 Hypertensive chronic kidney disease with stage 5 chronic kidney disease or end stage renal disease: Secondary | ICD-10-CM | POA: Diagnosis not present

## 2023-03-16 DIAGNOSIS — E1151 Type 2 diabetes mellitus with diabetic peripheral angiopathy without gangrene: Secondary | ICD-10-CM | POA: Diagnosis not present

## 2023-03-16 DIAGNOSIS — F419 Anxiety disorder, unspecified: Secondary | ICD-10-CM | POA: Diagnosis not present

## 2023-03-16 DIAGNOSIS — L97514 Non-pressure chronic ulcer of other 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: Secondary | ICD-10-CM | POA: Diagnosis not present

## 2023-03-17 DIAGNOSIS — N186 End stage renal disease: Secondary | ICD-10-CM | POA: Diagnosis not present

## 2023-03-17 DIAGNOSIS — N2581 Secondary hyperparathyroidism of renal origin: Secondary | ICD-10-CM | POA: Diagnosis not present

## 2023-03-17 DIAGNOSIS — Z992 Dependence on renal dialysis: Secondary | ICD-10-CM | POA: Diagnosis not present

## 2023-03-18 NOTE — Progress Notes (Signed)
Wendell, Xzavion ([id number]) [id number] Page 1 of 11 Visit Report for 03/16/2023 Chief Complaint Document Details Patient Name: Date of Service: [name], [name] 03/16/2023 11:30 A M Medical Record Number: [id number] Patient Account Number: [id number] Date of Birth/Sex: Treating RN: 1950/12/20 (72 y.o. M) Primary Care Provider: [name] Other Other Clinician: Referring Provider: Treating Provider/Extender: [name] Weeks in Treatment: 22 Information Obtained from: Patient Chief Complaint Patients presents for treatment of an open diabetic ulcer on the right great toe Electronic Signature(s) Signed: 03/16/2023 12:45:18 PM By: [name] MD FACS Entered By: [name] on 03/16/2023 12:45:18 -------------------------------------------------------------------------------- Cellular or Tissue Based Product Details Patient Name: Date of Service: [facility], [name] 03/16/2023 11:30 A M Medical Record Number: [id number] Patient Account Number: [id number] Date of Birth/Sex: Treating RN: 10-Oct-1950 (72 y.o. [name] Primary Care Provider: [name] Other Other Clinician: Referring Provider: Treating Provider/Extender: [name] Weeks in Treatment: 22 Cellular or Tissue Based Product Type Wound #1 Right,Medial T Great oe Applied to: Performed By: Physician [name], MD Cellular or Tissue Based Product Type: Epicord Level of Consciousness (Pre-procedure): Awake and Alert Pre-procedure Verification/Time Out Yes - 11:44 Taken: Location: genitalia / hands / feet / multiple digits Wound Size (sq cm): 0.04 Product Size (sq cm): 6 Waste Size (sq cm): 5 Waste Reason: wound size Amount of Product Applied (sq cm): 1 Instrument Used: Forceps, Scissors Lot #: [id number] Expiration Date: 09/02/2027 Fenestrated: No Reconstituted: Yes Solution Type: normal saline Solution Amount: 5 ml Lot #:  [id number] Solution Expiration Date: 10/23/2024 Secured: Yes Secured With: Steri-Strips Dressing Applied: Yes Primary Dressing: adaptic Procedural Pain: 0 Post Procedural Pain: 0 Response to Treatment: Procedure was tolerated well Level of Consciousness (Post- Awake and Alert [place], Matty ([id number]) [id number] Page 2 of 11 procedure): Post Procedure Diagnosis Same as Pre-procedure Notes Scribed for Dr Lady Gary by Samuella Bruin, RN Electronic Signature(s) Signed: 03/16/2023 12:49:31 PM By: [name] MD FACS Signed: 03/16/2023 3:21:56 PM By: Samuella Bruin Entered By: Samuella Bruin on 03/16/2023 11:46:41 -------------------------------------------------------------------------------- Debridement Details Patient Name: Date of Service: [name], [name] 03/16/2023 11:30 A M Medical Record Number: [id number] Patient Account Number: [id number] Date of Birth/Sex: Treating RN: November 15, 1950 (72 y.o. [name] Primary Care Provider: [name] Other Other Clinician: Referring Provider: Treating Provider/Extender: [name] Weeks in Treatment: 22 Debridement Performed for Assessment: Wound #1 Right,Medial T Great oe Performed By: Physician [name], MD Debridement Type: Debridement Severity of Tissue Pre Debridement: Bone involvement without necrosis Level of Consciousness (Pre-procedure): Awake and Alert Pre-procedure Verification/Time Out Yes - 11:41 Taken: Start Time: 11:41 Pain Control: Lidocaine 4% T opical Solution Percent of Wound Bed Debrided: 100% T Area Debrided (cm): otal 0.03 Tissue and other material debrided: Non-Viable, Slough, Subcutaneous, Slough Level: Skin/Subcutaneous Tissue Debridement Description: Excisional Instrument: Curette Bleeding: Minimum Hemostasis Achieved: Pressure Response to Treatment: Procedure was tolerated well Level of Consciousness (Post- Awake and  Alert procedure): Post Debridement Measurements of Total Wound Length: (cm) 0.2 Width: (cm) 0.2 Depth: (cm) 0.3 Volume: (cm) 0.009 Character of Wound/Ulcer Post Debridement: Improved Severity of Tissue Post Debridement: Bone involvement without necrosis Post Procedure Diagnosis Same as Pre-procedure Notes Scribed for Dr Lady Gary by Samuella Bruin, RN Electronic Signature(s) Signed: 03/16/2023 12:49:31 PM By: [name] MD FACS Signed: 03/16/2023 3:21:56 PM By: Samuella Bruin Entered By: Samuella Bruin on 03/16/2023 11:44:22 Urschel, Zedekiah ([id number]) [id number] Page 3 of 11 -------------------------------------------------------------------------------- HPI Details Patient Name: Date of Service: [name], [name] 03/16/2023 11:30 A  M Medical Record Number: [id number] Patient Account Number: [id number] Date of Birth/Sex: Treating RN: 10-Apr-1951 (72 y.o. M) Primary Care Provider: [name] Other Other Clinician: Referring Provider: Treating Provider/Extender: [name] Weeks in Treatment: 22 History of Present Illness HPI Description: ADMISSION 10/12/2022 This is a 72 year old type II diabetic (last hemoglobin A1c 7.7 in October 2023) with stage V chronic kidney disease and hypertension. He has been followed by podiatry for an ulcer on his right great toe. He has a history of prior toe and foot infections requiring amputation. In November 2023, he had an outpatient MRI that demonstrated osteomyelitis. He was admitted to the hospital for IV antibiotics and subsequently underwent irrigation and debridement of the toe with biopsy and placement of married 3 layer matrix. Cultures grew out MRSA and osteomyelitis was seen on pathology. He was followed by infectious disease and given a course of doxycycline and Augmentin. He completed this course at the end of December. He has been evaluated by vascular surgery with formal ABIs as well  as consultation. He does have adequate blood flow at least to the dorsalis pedis. He last saw podiatry on January 4 of this year where he underwent debridement. It was felt he would benefit from specialized wound care and he was referred to our center for further evaluation and management. On the dorsal aspect of his right great toe, there is a circular ulcer. There is a fair amount of undermining from callus and dry skin. The surface is a bit pale and fibrotic, but there is no obvious necrosis. 10/19/2022: He has been approved by his insurance to undergo hyperbaric oxygen therapy. EKG has been obtained and is normal. We are still waiting on his chest x-ray. The wound is clean with just a little bit of periwound callus and slough on the surface. Tendon is exposed. 10/26/2022: Unfortunately, the required co-pay from his insurance is cost prohibitive for him and he will not be able to receive hyperbaric oxygen therapy. Some moisture got under the dry skin around his wound and lifted it off but there is good epithelialized tissue beneath this. The exposed tendon has deteriorated. 11/03/2022: After some discussion with the financial office, it has become feasible for the patient to undergo hyperbaric oxygen therapy and he has received his first treatments this week. The wound is a little bit smaller and the tissue has a better color to it, but there is still extensive nonviable subcutaneous tissue and slough present. 11/09/2022: The wound measured a little bit smaller today. There is still slough and nonviable subcu tissue and tendon present. 11/16/2022: The wound continues to contract. There is still substantial depth to it, however, and bone remains exposed at the base. This is also a (early) 30-day update for HBO. 11/27/2022: Unfortunately, the wound has deteriorated. It appears that the tissue has become macerated. There is a second ulcer on the lateral aspect of his great toe. The nail is lifting off of the  bed and there is wet skin lifting off of the underlying tissue. The initial wound is smaller but still has a fair amount of depth to it. The patient does report quite a bit more drainage since his last visit. 12/20/2022: For scheduling reasons, somehow we have missed having a wound care visit in between his hyperbaric oxygen therapy treatments. The 2 openings of the wound have converged into 1 large site. There is necrotic tendon present at the lateral aspect of the great toe. The bone is visible, including the joint space.  The area of the initial wound is flush with the surrounding skin. No malodor or purulent drainage. 12/28/2022: The wound is vastly improved this week. The 2 areas of the wound have separated due to tissue bridging and healing the space between. There is no more necrotic tissue visible, although the bone/joint space remains exposed. 01/04/2023: His wound continues to improve. The more medial aspect of the toe is nearly closed. Bone remains exposed laterally. He has been approved for Epicord. He is going to start dialysis next week. 01/11/2023: The medial aspect of the wound is closed completely. The lateral open portion of his wound has contracted and is shallower. There is still bone exposure. He has started dialysis. We are going to apply Epicord today. 01/19/2023: There is good granulation tissue starting to fill in his wound. Bone remains exposed, however. 01/26/2023: The wound is smaller again today. The granulation tissue is a bit hypertrophic. There is still an area of bone that remains exposed. 02/02/2023: The wound is about the same size but has better tissue coverage. There is a protruding point of bone present. His insurance has changed and we are needing to secure approval again for his hyperbaric oxygen therapy treatments. 02/09/2023: The wound is quite a bit smaller today and there is robust granulation tissue on the surface. He has decided to discontinue his hyperbaric  oxygen treatments due to the financial issues associated with traveling to the wound care center on a daily basis, as well as his dialysis sessions. 02/16/2023: The wound continues to contract. There is very nice granulation tissue present and no slough or other debris accumulation. 02/22/2023: The wound is quite a bit smaller today. There is still some depth to it and I can still probe bone. 03/02/2023: The wound has contracted further. There is granulation tissue filling in the base. There is still a narrow passage through which I can probe to bone. 03/09/2023: The wound is down to just a very small hole through which I can still probe to bone. The rest of the wound has epithelialized. 03/16/2023: No real change in the wound dimensions. Bone is still exposed at the base. The edges are rolling inward, suggesting the wound has stalled. Electronic Signature(s) Signed: 03/16/2023 12:45:51 PM By: [name] MD FACS [place], [name] ([id number]) [id number] Page 4 of 11 Entered By: [name] on 03/16/2023 12:45:51 -------------------------------------------------------------------------------- Physical Exam Details Patient Name: Date of Service: [name], [name] 03/16/2023 11:30 A M Medical Record Number: [id number] Patient Account Number: [id number] Date of Birth/Sex: Treating RN: 11/15/1950 (72 y.o. M) Primary Care Provider: [name] Other Other Clinician: Referring Provider: Treating Provider/Extender: [name] Weeks in Treatment: 22 Constitutional Slightly hypertensive. . . . no acute distress. Respiratory Normal work of breathing on room air. Notes 03/16/2023: No real change in the wound dimensions. Bone is still exposed at the base. The edges are rolling inward, suggesting the wound has stalled. Electronic Signature(s) Signed: 03/16/2023 12:46:45 PM By: [name] MD FACS Entered By: [name] on 03/16/2023  12:46:45 -------------------------------------------------------------------------------- Physician Orders Details Patient Name: Date of Service: [facility], [name] 03/16/2023 11:30 A M Medical Record Number: [id number] Patient Account Number: [id number] Date of Birth/Sex: Treating RN: 12-30-1950 (72 y.o. [name] Primary Care Provider: [name] Other Other Clinician: Referring Provider: Treating Provider/Extender: [name] Weeks in Treatment: 22 Verbal / Phone Orders: No Diagnosis Coding ICD-10 Coding Code Description L97.514 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.621 Type  2 diabetes mellitus with foot ulcer N18.6 End stage renal disease M86.9 Osteomyelitis, unspecified E11.42 Type 2 diabetes mellitus with diabetic polyneuropathy I10 Essential (primary) hypertension Follow-up Appointments ppointment in 1 week. - Dr. Lady Gary Rm 1 Return A Anesthetic Wound #1 Right,Medial T Great oe (In clinic) Topical Lidocaine 4% applied to wound bed Cellular or Tissue Based Products Cellular or Tissue Based Product Type: - epicord #10 Cellular or Tissue Based Product applied to wound bed, secured with steri-strips, cover with Adaptic or Mepitel. (DO NOT REMOVE). Jarrell, Reggie ([id number]) [id number] Page 5 of 11 Bathing/ Shower/ Hygiene May shower with protection but do not get wound dressing(s) wet. Protect dressing(s) with water repellant cover (for example, large plastic bag) or a cast cover and may then take shower. Off-Loading Wound #1 Right,Medial T Great oe Other: - Diabetic shoe Wound Treatment Wound #1 - T Great oe Wound Laterality: Right, Medial Cleanser: Soap and Water 1 x Per Week/30 Days Discharge Instructions: May shower and wash wound with dial antibacterial soap and water prior to dressing change. Cleanser: Wound Cleanser 1 x Per Week/30 Days Discharge Instructions: Cleanse  the wound with wound cleanser prior to applying a clean dressing using gauze sponges, not tissue or cotton balls. Prim Dressing: Epicord ary 1 x Per Week/30 Days Secondary Dressing: ADAPTIC TOUCH 3x4.25 in 1 x Per Week/30 Days Discharge Instructions: Apply over primary dressing as directed. Secondary Dressing: Woven Gauze Sponges 2x2 in 1 x Per Week/30 Days Discharge Instructions: Apply over primary dressing as directed. Secured With: Insurance underwriter, Sterile 2x75 (in/in) 1 x Per Week/30 Days Discharge Instructions: Secure with stretch gauze as directed. Secured With: 26M Medipore Scientist, research (life sciences) Surgical T 2x10 (in/yd) 1 x Per Week/30 Days ape Discharge Instructions: Secure with tape as directed. Patient Medications llergies: losartan potassium, sildenafil A Notifications Medication Indication Start End 03/16/2023 lidocaine DOSE topical 4 % cream - cream topical Electronic Signature(s) Signed: 03/16/2023 12:49:31 PM By: [name] MD FACS Entered By: [name] on 03/16/2023 12:46:58 -------------------------------------------------------------------------------- Problem List Details Patient Name: Date of Service: [facility], [name] 03/16/2023 11:30 A M Medical Record Number: [id number] Patient Account Number: [id number] Date of Birth/Sex: Treating RN: 05/04/1951 (72 y.o. M) Primary Care Provider: [name] Other Other Clinician: Referring Provider: Treating Provider/Extender: [name] Weeks in Treatment: 22 Active Problems ICD-10 Encounter Code Description Active Date MDM Diagnosis L97.514 Non-pressure chronic ulcer of other 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 10/12/2022 No Yes E11.621 Type 2 diabetes mellitus with foot ulcer 10/12/2022 No Yes Semrad, Shon ([id number]) [phone].pdf Page 6 of 11 N18.6 End stage renal disease 10/12/2022 No Yes M86.9 Osteomyelitis, unspecified 10/12/2022 No Yes E11.42 Type 2  diabetes mellitus with diabetic polyneuropathy 10/12/2022 No Yes I10 Essential (primary) hypertension 10/12/2022 No Yes Inactive Problems Resolved Problems Electronic Signature(s) Signed: 03/16/2023 12:44:44 PM By: [name] MD FACS Entered By: [name] on 03/16/2023 12:44:44 -------------------------------------------------------------------------------- Progress Note Details Patient Name: Date of Service: [name], [name] 03/16/2023 11:30 A M Medical Record Number: [id number] Patient Account Number: [id number] Date of Birth/Sex: Treating RN: 1951-05-28 (72 y.o. M) Primary Care Provider: [name] Other Other Clinician: Referring Provider: Treating Provider/Extender: [name] Weeks in Treatment: 22 Subjective Chief Complaint Information obtained from Patient Patients presents for treatment of an open diabetic ulcer on the right great toe History of Present Illness (HPI) ADMISSION 10/12/2022 This is a 72 year old type II diabetic (last hemoglobin A1c 7.7 in October 2023) with stage V chronic kidney disease and  hypertension. He has been followed by podiatry for an ulcer on his right great toe. He has a history of prior toe and foot infections requiring amputation. In November 2023, he had an outpatient MRI that demonstrated osteomyelitis. He was admitted to the hospital for IV antibiotics and subsequently underwent irrigation and debridement of the toe with biopsy and placement of married 3 layer matrix. Cultures grew out MRSA and osteomyelitis was seen on pathology. He was followed by infectious disease and given a course of doxycycline and Augmentin. He completed this course at the end of December. He has been evaluated by vascular surgery with formal ABIs as well as consultation. He does have adequate blood flow at least to the dorsalis pedis. He last saw podiatry on January 4 of this year where he underwent debridement. It was felt he would benefit from  specialized wound care and he was referred to our center for further evaluation and management. On the dorsal aspect of his right great toe, there is a circular ulcer. There is a fair amount of undermining from callus and dry skin. The surface is a bit pale and fibrotic, but there is no obvious necrosis. 10/19/2022: He has been approved by his insurance to undergo hyperbaric oxygen therapy. EKG has been obtained and is normal. We are still waiting on his chest x-ray. The wound is clean with just a little bit of periwound callus and slough on the surface. Tendon is exposed. 10/26/2022: Unfortunately, the required co-pay from his insurance is cost prohibitive for him and he will not be able to receive hyperbaric oxygen therapy. Some moisture got under the dry skin around his wound and lifted it off but there is good epithelialized tissue beneath this. The exposed tendon has deteriorated. 11/03/2022: After some discussion with the financial office, it has become feasible for the patient to undergo hyperbaric oxygen therapy and he has received his first treatments this week. The wound is a little bit smaller and the tissue has a better color to it, but there is still extensive nonviable subcutaneous tissue and slough present. 11/09/2022: The wound measured a little bit smaller today. There is still slough and nonviable subcu tissue and tendon present. 11/16/2022: The wound continues to contract. There is still substantial depth to it, however, and bone remains exposed at the base. This is also a (early) 30-day update for HBO. 11/27/2022: Unfortunately, the wound has deteriorated. It appears that the tissue has become macerated. There is a second ulcer on the lateral aspect of his great toe. The nail is lifting off of the bed and there is wet skin lifting off of the underlying tissue. The initial wound is smaller but still has a fair amount of depth to it. The patient does report quite a bit more drainage since  his last visit. [place], Angas ([id number]) [id number] Page 7 of 11 12/20/2022: For scheduling reasons, somehow we have missed having a wound care visit in between his hyperbaric oxygen therapy treatments. The 2 openings of the wound have converged into 1 large site. There is necrotic tendon present at the lateral aspect of the great toe. The bone is visible, including the joint space. The area of the initial wound is flush with the surrounding skin. No malodor or purulent drainage. 12/28/2022: The wound is vastly improved this week. The 2 areas of the wound have separated due to tissue bridging and healing the space between. There is no more necrotic tissue visible, although the bone/joint space remains exposed. 01/04/2023: His wound  continues to improve. The more medial aspect of the toe is nearly closed. Bone remains exposed laterally. He has been approved for Epicord. He is going to start dialysis next week. 01/11/2023: The medial aspect of the wound is closed completely. The lateral open portion of his wound has contracted and is shallower. There is still bone exposure. He has started dialysis. We are going to apply Epicord today. 01/19/2023: There is good granulation tissue starting to fill in his wound. Bone remains exposed, however. 01/26/2023: The wound is smaller again today. The granulation tissue is a bit hypertrophic. There is still an area of bone that remains exposed. 02/02/2023: The wound is about the same size but has better tissue coverage. There is a protruding point of bone present. His insurance has changed and we are needing to secure approval again for his hyperbaric oxygen therapy treatments. 02/09/2023: The wound is quite a bit smaller today and there is robust granulation tissue on the surface. He has decided to discontinue his hyperbaric oxygen treatments due to the financial issues associated with traveling to the wound care center on a daily basis, as  well as his dialysis sessions. 02/16/2023: The wound continues to contract. There is very nice granulation tissue present and no slough or other debris accumulation. 02/22/2023: The wound is quite a bit smaller today. There is still some depth to it and I can still probe bone. 03/02/2023: The wound has contracted further. There is granulation tissue filling in the base. There is still a narrow passage through which I can probe to bone. 03/09/2023: The wound is down to just a very small hole through which I can still probe to bone. The rest of the wound has epithelialized. 03/16/2023: No real change in the wound dimensions. Bone is still exposed at the base. The edges are rolling inward, suggesting the wound has stalled. Patient History Information obtained from Patient. Family History Diabetes - Mother,Siblings, Heart Disease - Siblings, Hypertension - Siblings, Seizures - Father, Stroke - Siblings, No family history of Cancer, Hereditary Spherocytosis, Kidney Disease, Lung Disease, Thyroid Problems, Tuberculosis. Social History Former smoker, Marital Status - Married, Alcohol Use - Rarely, Drug Use - No History, Caffeine Use - Daily. Medical History Eyes Patient has history of Cataracts - rt eye Ear/Nose/Mouth/Throat Denies history of Chronic sinus problems/congestion Hematologic/Lymphatic Denies history of Anemia, Hemophilia, Human Immunodeficiency Virus, Lymphedema, Sickle Cell Disease Respiratory Denies history of Aspiration, Asthma, Chronic Obstructive Pulmonary Disease (COPD), Pneumothorax, Tuberculosis Cardiovascular Patient has history of Hypertension Gastrointestinal Denies history of Cirrhosis , Colitis, Crohns, Hepatitis A, Hepatitis B, Hepatitis C Endocrine Patient has history of Type II Diabetes Genitourinary Denies history of End Stage Renal Disease Immunological Denies history of Lupus Erythematosus, Raynauds, Scleroderma Integumentary (Skin) Denies history of History of  Burn Musculoskeletal Patient has history of Osteomyelitis - MRI confirmed (08/16/22) Denies history of Gout Neurologic Patient has history of Neuropathy Psychiatric Patient has history of Confinement Anxiety Denies history of Anorexia/bulimia Hospitalization/Surgery History - AV fistula place left upper arm 10/20/22. Medical A Surgical History Notes nd Genitourinary CKD IV Objective Sciandra, Maejor ([id number]) [id number] Page 8 of 11 Constitutional Slightly hypertensive. no acute distress. Vitals Time Taken: 11:34 AM, Height: 72 in, Weight: 185 lbs, BMI: 25.1, Temperature: 98.1 F, Pulse: 92 bpm, Respiratory Rate: 18 breaths/min, Blood Pressure: 145/70 mmHg, Capillary Blood Glucose: 128 mg/dl. Respiratory Normal work of breathing on room air. General Notes: 03/16/2023: No real change in the wound dimensions. Bone is still exposed at the base. The edges are  rolling inward, suggesting the wound has stalled. Integumentary (Hair, Skin) Wound #1 status is Open. Original cause of wound was Pressure Injury. The date acquired was: 08/16/2022. The wound has been in treatment 22 weeks. The wound is located on the Right,Medial T Great. The wound measures 0.2cm length x 0.2cm width x 0.3cm depth; 0.031cm^2 area and 0.009cm^3 volume. oe There is bone and Fat Layer (Subcutaneous Tissue) exposed. There is no tunneling or undermining noted. There is a small amount of serosanguineous drainage noted. The wound margin is distinct with the outline attached to the wound base. There is large (67-100%) red granulation within the wound bed. There is a small (1-33%) amount of necrotic tissue within the wound bed including Adherent Slough. The periwound skin appearance had no abnormalities noted for texture. The periwound skin appearance had no abnormalities noted for moisture. The periwound skin appearance had no abnormalities noted for color. Periwound temperature was noted as No  Abnormality. Assessment Active Problems ICD-10 Non-pressure chronic ulcer of other 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 Type 2 diabetes mellitus with foot ulcer End stage renal disease Osteomyelitis, unspecified Type 2 diabetes mellitus with diabetic polyneuropathy Essential (primary) hypertension Procedures Wound #1 Pre-procedure diagnosis of Wound #1 is a Diabetic Wound/Ulcer of the Lower Extremity located on the Right,Medial T Great .Severity of Tissue Pre oe Debridement is: Bone involvement without necrosis. There was a Excisional Skin/Subcutaneous Tissue Debridement with a total area of 0.03 sq cm performed by [name], MD. With the following instrument(s): Curette to remove Non-Viable tissue/material. Material removed includes Subcutaneous Tissue and Slough and after achieving pain control using Lidocaine 4% Topical Solution. No specimens were taken. A time out was conducted at 11:41, prior to the start of the procedure. A Minimum amount of bleeding was controlled with Pressure. The procedure was tolerated well. Post Debridement Measurements: 0.2cm length x 0.2cm width x 0.3cm depth; 0.009cm^3 volume. Character of Wound/Ulcer Post Debridement is improved. Severity of Tissue Post Debridement is: Bone involvement without necrosis. Post procedure Diagnosis Wound #1: Same as Pre-Procedure General Notes: Scribed for Dr Lady Gary by Samuella Bruin, RN. Pre-procedure diagnosis of Wound #1 is a Diabetic Wound/Ulcer of the Lower Extremity located on the Right,Medial T Great. A skin graft procedure using a oe bioengineered skin substitute/cellular or tissue based product was performed by [name], MD with the following instrument(s): Forceps and Scissors. Epicord was applied and secured with Steri-Strips. 1 sq cm of product was utilized and 5 sq cm was wasted due to wound size. Post Application, adaptic was applied. A Time Out was conducted at 11:44, prior to the start of  the procedure. The procedure was tolerated well with a pain level of 0 throughout and a pain level of 0 following the procedure. Post procedure Diagnosis Wound #1: Same as Pre-Procedure General Notes: Scribed for Dr Lady Gary by Samuella Bruin, RN. Plan Follow-up Appointments: Return Appointment in 1 week. - Dr. Lady Gary Rm 1 Anesthetic: Wound #1 Right,Medial T Great: oe (In clinic) Topical Lidocaine 4% applied to wound bed Cellular or Tissue Based Products: Cellular or Tissue Based Product Type: - epicord #10 Cellular or Tissue Based Product applied to wound bed, secured with steri-strips, cover with Adaptic or Mepitel. (DO NOT REMOVE). Bathing/ Shower/ Hygiene: May shower with protection but do not get wound dressing(s) wet. Protect dressing(s) with water repellant cover (for example, large plastic bag) or a cast cover and may then take shower. Off-Loading: Wound #1 Right,Medial T Great: oe Other: - Diabetic shoe The  following medication(s) was prescribed: lidocaine topical 4 % cream cream topical was prescribed at facility WOUND #1: - T Great Wound Laterality: Right, trevonn cardullo, Zevin ([id number]) [phone].pdf Page 9 of 11 Cleanser: Soap and Water 1 x Per Week/30 Days Discharge Instructions: May shower and wash wound with dial antibacterial soap and water prior to dressing change. Cleanser: Wound Cleanser 1 x Per Week/30 Days Discharge Instructions: Cleanse the wound with wound cleanser prior to applying a clean dressing using gauze sponges, not tissue or cotton balls. Prim Dressing: Epicord 1 x Per Week/30 Days ary Secondary Dressing: ADAPTIC TOUCH 3x4.25 in 1 x Per Week/30 Days Discharge Instructions: Apply over primary dressing as directed. Secondary Dressing: Woven Gauze Sponges 2x2 in 1 x Per Week/30 Days Discharge Instructions: Apply over primary dressing as directed. Secured With: Insurance underwriter, Sterile 2x75 (in/in) 1 x  Per Week/30 Days Discharge Instructions: Secure with stretch gauze as directed. Secured With: 5M Medipore Scientist, research (life sciences) Surgical T 2x10 (in/yd) 1 x Per Week/30 Days ape Discharge Instructions: Secure with tape as directed. 03/16/2023: No real change in the wound dimensions. Bone is still exposed at the base. The edges are rolling inward, suggesting the wound has stalled. I used a curette to debride the wound. I was fairly aggressive, paring away the skin edges and removing slough and subcutaneous tissue to try and stimulate the healing cascade, basically creating an acute wound out of the chronic wound. Once I achieved hemostasis, Epicord was rehydrated and packed into the wound. Area with Adaptic and Steri-Strips. He has done well with skin substitute application and I would like to continue but this is his 10th Epicord. T see if o we can get him covered for Kerecis powder, which may be easier to pack into the small hole that remains in his wound. He will follow-up in 1 week. Electronic Signature(s) Signed: 03/16/2023 12:48:31 PM By: [name] MD FACS Entered By: [name] on 03/16/2023 12:48:31 -------------------------------------------------------------------------------- HxROS Details Patient Name: Date of Service: [facility] [name], [name] 03/16/2023 11:30 A M Medical Record Number: [id number] Patient Account Number: [id number] Date of Birth/Sex: Treating RN: 10-14-1950 (72 y.o. M) Primary Care Provider: [name] Other Other Clinician: Referring Provider: Treating Provider/Extender: [name] Weeks in Treatment: 22 Information Obtained From Patient Eyes Medical History: Positive for: Cataracts - rt eye Ear/Nose/Mouth/Throat Medical History: Negative for: Chronic sinus problems/congestion Hematologic/Lymphatic Medical History: Negative for: Anemia; Hemophilia; Human Immunodeficiency Virus; Lymphedema; Sickle Cell Disease Respiratory Medical History: Negative  for: Aspiration; Asthma; Chronic Obstructive Pulmonary Disease (COPD); Pneumothorax; Tuberculosis Cardiovascular Medical History: Positive for: Hypertension Gastrointestinal Medical History: Negative for: Cirrhosis ; Colitis; Crohns; Hepatitis A; Hepatitis B; Hepatitis C [place], [place] ([id number]) [phone].pdf Page 10 of 11 Endocrine Medical History: Positive for: Type II Diabetes Time with diabetes: 2000 Treated with: Insulin Blood sugar tested every day: Yes Tested : Genitourinary Medical History: Negative for: End Stage Renal Disease Past Medical History Notes: CKD IV Immunological Medical History: Negative for: Lupus Erythematosus; Raynauds; Scleroderma Integumentary (Skin) Medical History: Negative for: History of Burn Musculoskeletal Medical History: Positive for: Osteomyelitis - MRI confirmed (08/16/22) Negative for: Gout Neurologic Medical History: Positive for: Neuropathy Psychiatric Medical History: Positive for: Confinement Anxiety Negative for: Anorexia/bulimia HBO Extended History Items Eyes: Cataracts Immunizations Pneumococcal Vaccine: Received Pneumococcal Vaccination: Yes Received Pneumococcal Vaccination On or After 60th Birthday: Yes Implantable Devices None Hospitalization / Surgery History Type of Hospitalization/Surgery AV fistula place left upper arm 10/20/22 Family and Social History Cancer: No; Diabetes: Yes -  Mother,Siblings; Heart Disease: Yes - Siblings; Hereditary Spherocytosis: No; Hypertension: Yes - Siblings; Kidney Disease: No; Lung Disease: No; Seizures: Yes - Father; Stroke: Yes - Siblings; Thyroid Problems: No; Tuberculosis: No; Former smoker; Marital Status - Married; Alcohol Use: Rarely; Drug Use: No History; Caffeine Use: Daily; Financial Concerns: No; Food, Clothing or Shelter Needs: No; Support System Lacking: No; Transportation Concerns: No Electronic Signature(s) Signed: 03/16/2023 12:49:31 PM  By: [name] MD FACS Entered By: [name] on 03/16/2023 12:46:21 [place], [name] ([id number]) [id number] Page 11 of 11 -------------------------------------------------------------------------------- SuperBill Details Patient Name: Date of ServiceBarkley Boards [name], [name] 03/16/2023 Medical Record Number: [id number] Patient Account Number: [id number] Date of Birth/Sex: Treating RN: 1951-03-25 (72 y.o. M) Primary Care Provider: [name] Other Other Clinician: Referring Provider: Treating Provider/Extender: [name] Weeks in Treatment: 22 Diagnosis Coding ICD-10 Codes Code Description L97.514 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.621 Type 2 diabetes mellitus with foot ulcer N18.6 End stage renal disease M86.9 Osteomyelitis, unspecified E11.42 Type 2 diabetes mellitus with diabetic polyneuropathy I10 Essential (primary) hypertension Facility Procedures : CPT4 Code: 08657846 Description: Q4187 Epicord 2cm x 3cm - per sqcm Modifier: Quantity: 6 : CPT4 Code: 96295284 Description: 15275 - SKIN SUB GRAFT FACE/NK/HF/G ICD-10 Diagnosis Description L97.514 Non-pressure chronic ulcer of other 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 Modifier: Quantity: 1 Physician Procedures : CPT4 Code Description Modifier [id number] - WC PHYS LEVEL 4 - EST PT 25 ICD-10 Diagnosis Description L97.514 Non-pressure chronic ulcer of other 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 M86.9 Osteomyelitis, unspecified E11.621 Type 2 diabetes  mellitus with foot ulcer N18.6 End stage renal disease Quantity: 1 : [id number] 15275 - WC PHYS SKIN SUB GRAFT FACE/NK/HF/G ICD-10 Diagnosis Description L97.514 Non-pressure chronic ulcer of other 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 Quantity: 1 Electronic Signature(s) Signed: 03/16/2023 12:49:02 PM By: [name] MD FACS Entered By: [name] on 03/16/2023  12:49:02

## 2023-03-18 NOTE — Progress Notes (Signed)
Clive, Lional ([id number]) [id number] Page 1 of 7 Visit Report for 03/16/2023 Arrival Information Details Patient Name: Date of Service: [place], [name] 03/16/2023 11:30 A M Medical Record Number: [id number] Patient Account Number: [id number] Date of Birth/Sex: Treating RN: July 20, 1951 (72 y.o. [name] Primary Care Lesean Woolverton: [name] Other Other Clinician: Referring Decklin Weddington: Treating Murle Hellstrom/Extender: [name] Weeks in Treatment: 22 Visit Information History Since Last Visit Added or deleted any medications: No Patient Arrived: [name] Any new allergies or adverse reactions: No Arrival Time: 11:34 Had a fall or experienced change in No Accompanied By: self activities of daily living that may affect Transfer Assistance: None risk of falls: Patient Identification Verified: Yes Signs or symptoms of abuse/neglect since last visito No Secondary Verification Process Completed: Yes Hospitalized since last visit: No Patient Requires Transmission-Based Precautions: No Implantable device outside of the clinic excluding No Patient Has Alerts: No cellular tissue based products placed in the center since last visit: Has Dressing in Place as Prescribed: Yes Pain Present Now: No Electronic Signature(s) Signed: 03/16/2023 3:21:56 PM By: Samuella Bruin Entered By: Samuella Bruin on 03/16/2023 11:34:38 -------------------------------------------------------------------------------- Encounter Discharge Information Details Patient Name: Date of Service: [facility], [name] 03/16/2023 11:30 A M Medical Record Number: [id number] Patient Account Number: [id number] Date of Birth/Sex: Treating RN: [birth year] (72 y.o. [name] Primary Care Jerian Morais: [name] Other Other Clinician: Referring Alzada Brazee: Treating Odaly Peri/Extender: [name] Weeks in Treatment: 22 Encounter Discharge Information Items Post Procedure  Vitals Discharge Condition: Stable Temperature (F): 98.1 Ambulatory Status: Cane Pulse (bpm): 91 Discharge Destination: Home Respiratory Rate (breaths/min): 18 Transportation: Private Auto Blood Pressure (mmHg): 145/70 Accompanied By: self Schedule Follow-up Appointment: Yes Clinical Summary of Care: Patient Declined Electronic Signature(s) Signed: 03/16/2023 3:21:56 PM By: Samuella Bruin Entered By: Samuella Bruin on 03/16/2023 11:59:14 Chiong, Ramon ([id number]) [id number] Page 2 of 7 -------------------------------------------------------------------------------- Lower Extremity Assessment Details Patient Name: Date of ServiceDorise [place], [name] 03/16/2023 11:30 A M Medical Record Number: [id number] Patient Account Number: [id number] Date of Birth/Sex: Treating RN: 1951-04-22 (72 y.o. [name] Primary Care Calais Svehla: [name] Other Other Clinician: Referring Melysa Schroyer: Treating Beecher Furio/Extender: [name] Weeks in Treatment: 22 Edema Assessment Assessed: [Left: No] [Right: No] Edema: [Left: N] [Right: o] Calf Left: Right: Point of Measurement: From Medial Instep 36 cm Ankle Left: Right: Point of Measurement: From Medial Instep 22 cm Electronic Signature(s) Signed: 03/16/2023 3:21:56 PM By: Samuella Bruin Entered By: Samuella Bruin on 03/16/2023 11:35:04 -------------------------------------------------------------------------------- Multi Wound Chart Details Patient Name: Date of Service: [facility], [name] 03/16/2023 11:30 A M Medical Record Number: [id number] Patient Account Number: [id number] Date of Birth/Sex: Treating RN: Sep 22, 1951 (72 y.o. M) Primary Care Corney Knighton: [name] Other Other Clinician: Referring Jenice Leiner: Treating Azuri Bozard/Extender: [name] Weeks in Treatment: 22 Vital Signs Height(in): 72 Capillary Blood Glucose(mg/dl): 416 Weight(lbs): 606 Pulse(bpm): 92 Body  Mass Index(BMI): 25.1 Blood Pressure(mmHg): 145/70 Temperature(F): 98.1 Respiratory Rate(breaths/min): 18 [1:Photos:] [N/A:N/A] Right, Medial T Great oe N/A N/A Wound Location: Pressure Injury N/A N/A Wounding Event: Diabetic Wound/Ulcer of the Lower N/A N/A Primary Etiology: Extremity Acute Osteomyelitis N/A N/A Secondary Etiology: Cataracts, Hypertension, Type II N/A N/A Comorbid HistoryBERKLEE, ZACHAR ([id number]) [id number] Page 3 of 7 Diabetes, Osteomyelitis, Neuropathy, Confinement Anxiety 08/16/2022 N/A N/A Date Acquired: 22 N/A N/A Weeks of Treatment: Open N/A N/A Wound Status: No N/A N/A Wound Recurrence: 0.2x0.2x0.3 N/A N/A Measurements L x W x D (cm) 0.031 N/A N/A A (cm) : rea 0.009 N/A N/A  Volume (cm) : 88.70% N/A N/A % Reduction in A rea: 91.80% N/A N/A % Reduction in Volume: Grade 3 N/A N/A Classification: Small N/A N/A Exudate A mount: Serosanguineous N/A N/A Exudate Type: red, brown N/A N/A Exudate Color: Distinct, outline attached N/A N/A Wound Margin: Large (67-100%) N/A N/A Granulation A mount: Red N/A N/A Granulation Quality: Small (1-33%) N/A N/A Necrotic A mount: Fat Layer (Subcutaneous Tissue): Yes N/A N/A Exposed Structures: Bone: Yes Fascia: No Tendon: No Muscle: No Joint: No Medium (34-66%) N/A N/A Epithelialization: Debridement - Excisional N/A N/A Debridement: Pre-procedure Verification/Time Out 11:41 N/A N/A Taken: Lidocaine 4% Topical Solution N/A N/A Pain Control: Subcutaneous, Slough N/A N/A Tissue Debrided: Skin/Subcutaneous Tissue N/A N/A Level: 0.03 N/A N/A Debridement A (sq cm): rea Curette N/A N/A Instrument: Minimum N/A N/A Bleeding: Pressure N/A N/A Hemostasis A chieved: Procedure was tolerated well N/A N/A Debridement Treatment Response: 0.2x0.2x0.3 N/A N/A Post Debridement Measurements L x W x D (cm) 0.009 N/A N/A Post Debridement Volume: (cm) Callus: Yes N/A  N/A Periwound Skin Texture: Maceration: Yes N/A N/A Periwound Skin Moisture: No Abnormalities Noted N/A N/A Periwound Skin Color: No Abnormality N/A N/A Temperature: Cellular or Tissue Based Product N/A N/A Procedures Performed: Debridement Treatment Notes Wound #1 (Toe Great) Wound Laterality: Right, Medial Cleanser Soap and Water Discharge Instruction: May shower and wash wound with dial antibacterial soap and water prior to dressing change. Wound Cleanser Discharge Instruction: Cleanse the wound with wound cleanser prior to applying a clean dressing using gauze sponges, not tissue or cotton balls. Peri-Wound Care Topical Primary Dressing Epicord Secondary Dressing ADAPTIC TOUCH 3x4.25 in Discharge Instruction: Apply over primary dressing as directed. Woven Gauze Sponges 2x2 in Discharge Instruction: Apply over primary dressing as directed. Secured With Conforming Stretch Gauze Bandage, Sterile 2x75 (in/in) Discharge Instruction: Secure with stretch gauze as directed. 35M Medipore Soft Cloth Surgical T 2x10 (in/yd) ape Discharge Instruction: Secure with tape as directed. Compression Wrap Compression Stockings Add-Ons REAKWON, BLESSINGER ([id number]) [id number] Page 4 of 7 Electronic Signature(s) Signed: 03/16/2023 12:44:51 PM By: [name] MD FACS Entered By: [name] on 03/16/2023 12:44:51 -------------------------------------------------------------------------------- Multi-Disciplinary Care Plan Details Patient Name: Date of Service: [facility], [name] 03/16/2023 11:30 A M Medical Record Number: [id number] Patient Account Number: [id number] Date of Birth/Sex: Treating RN: Dec 15, 1950 (72 y.o. [name] Primary Care Abdoul Encinas: [name] Other Other Clinician: Referring Alisa Stjames: Treating Ilhan Madan/Extender: [name] Weeks in Treatment: 22 Multidisciplinary Care Plan reviewed with physician Active  Inactive Nutrition Nursing Diagnoses: Impaired glucose control: actual or potential Goals: Patient/caregiver will maintain therapeutic glucose control Date Initiated: 10/12/2022 Target Resolution Date: 03/30/2023 Goal Status: Active Interventions: Provide education on elevated blood sugars and impact on wound healing Provide education on nutrition Treatment Activities: Dietary management education, guidance and counseling : 10/12/2022 Giving encouragement to exercise : 10/12/2022 Notes: Osteomyelitis Nursing Diagnoses: Infection: osteomyelitis Goals: Patient/caregiver will verbalize understanding of disease process and disease management Date Initiated: 10/12/2022 Target Resolution Date: 03/30/2023 Goal Status: Active Interventions: Assess for signs and symptoms of osteomyelitis resolution every visit Provide education on osteomyelitis Treatment Activities: MRI : 08/16/2022 Notes: Electronic Signature(s) Signed: 03/16/2023 3:21:56 PM By: Samuella Bruin Entered By: Samuella Bruin on 03/16/2023 11:44:01 Massaro, Daquane ([id number]) [id number] Page 5 of 7 -------------------------------------------------------------------------------- Pain Assessment Details Patient Name: Date of ServiceDorise [place], [name] 03/16/2023 11:30 A M Medical Record Number: [id number] Patient Account Number: [id number] Date of Birth/Sex: Treating RN: [birth year] (72 y.o. [name] Primary Care Dawnna Gritz: [name] Other Other Clinician:  Referring Edvin Albus: Treating Havish Petties/Extender: [name] Weeks in Treatment: 22 Active Problems Location of Pain Severity and Description of Pain Patient Has Paino No Site Locations Rate the pain. Current Pain Level: 0 Pain Management and Medication Current Pain Management: Electronic Signature(s) Signed: 03/16/2023 3:21:56 PM By: Samuella Bruin Entered By: Samuella Bruin on 03/16/2023  11:35:02 -------------------------------------------------------------------------------- Patient/Caregiver Education Details Patient Name: Date of Service: [name], [name] 6/14/2024andnbsp11:30 A M Medical Record Number: [id number] Patient Account Number: [id number] Date of Birth/Gender: Treating RN: Sep 12, 1951 (72 y.o. [name] Primary Care Physician: [name] Other Other Clinician: Referring Physician: Treating Physician/Extender: Gracy Racer in Treatment: 22 Education Assessment Education Provided To: Patient Education Topics Provided Wound/Skin Impairment: Methods: Explain/Verbal Responses: Reinforcements needed, State content correctly [employer]) [place], [place] ([id number]) [id number] Page 6 of 7 Signed: 03/16/2023 3:21:56 PM By: Samuella Bruin Entered By: Samuella Bruin on 03/16/2023 11:44:12 -------------------------------------------------------------------------------- Wound Assessment Details Patient Name: Date of [place]ise [name] 03/16/2023 11:30 A M Medical Record Number: [id number] Patient Account Number: [id number] Date of Birth/Sex: Treating RN: 03-27-1951 (72 y.o. [name] Primary Care Earlyne Feeser: [name] Other Other Clinician: Referring Peterson Mathey: Treating Shuntae Herzig/Extender: [name] Weeks in Treatment: 22 Wound Status Wound Number: 1 Primary Diabetic Wound/Ulcer of the Lower Extremity Etiology: Wound Location: Right, Medial T Great oe Secondary Acute Osteomyelitis Wounding Event: Pressure Injury Etiology: Date Acquired: 08/16/2022 Wound Open Weeks Of Treatment: 22 Status: Clustered Wound: No Comorbid Cataracts, Hypertension, Type II Diabetes, Osteomyelitis, History: Neuropathy, Confinement Anxiety Photos Wound Measurements Length: (cm) 0.2 Width: (cm) 0.2 Depth: (cm) 0.3 Area: (cm) 0.031 Volume: (cm) 0.009 % Reduction in Area:  88.7% % Reduction in Volume: 91.8% Epithelialization: Medium (34-66%) Tunneling: No Undermining: No Wound Description Classification: Grade 3 Wound Margin: Distinct, outline attached Exudate Amount: Small Exudate Type: Serosanguineous Exudate Color: red, brown Foul Odor After Cleansing: No Slough/Fibrino Yes Wound Bed Granulation Amount: Large (67-100%) Exposed Structure Granulation Quality: Red Fascia Exposed: No Necrotic Amount: Small (1-33%) Fat Layer (Subcutaneous Tissue) Exposed: Yes Necrotic Quality: Adherent Slough Tendon Exposed: No Muscle Exposed: No Joint Exposed: No Bone Exposed: Yes Periwound Skin Texture Texture Color No Abnormalities Noted: Yes No Abnormalities Noted: Yes Moisture Temperature / Pain No Abnormalities Noted: Yes Temperature: No Abnormality Koltz, Maddex ([id number]) [id number] Page 7 of 7 Treatment Notes Wound #1 (Toe Great) Wound Laterality: Right, Medial Cleanser Soap and Water Discharge Instruction: May shower and wash wound with dial antibacterial soap and water prior to dressing change. Wound Cleanser Discharge Instruction: Cleanse the wound with wound cleanser prior to applying a clean dressing using gauze sponges, not tissue or cotton balls. Peri-Wound Care Topical Primary Dressing Epicord Secondary Dressing ADAPTIC TOUCH 3x4.25 in Discharge Instruction: Apply over primary dressing as directed. Woven Gauze Sponges 2x2 in Discharge Instruction: Apply over primary dressing as directed. Secured With Conforming Stretch Gauze Bandage, Sterile 2x75 (in/in) Discharge Instruction: Secure with stretch gauze as directed. 64M Medipore Soft Cloth Surgical T 2x10 (in/yd) ape Discharge Instruction: Secure with tape as directed. Compression Wrap Compression Stockings Add-Ons Electronic Signature(s) Signed: 03/16/2023 3:21:56 PM By: Samuella Bruin Entered By: Samuella Bruin on 03/16/2023  11:38:51 -------------------------------------------------------------------------------- Vitals Details Patient Name: Date of Service: [name], [name] 03/16/2023 11:30 A M Medical Record Number: [id number] Patient Account Number: [id number] Date of Birth/Sex: Treating RN: [birth year] (72 y.o. [name] Primary Care Quaron Delacruz: [name] Other Other Clinician: Referring Miya Luviano: Treating Eswin Worrell/Extender: [name] Weeks in Treatment: 22 Vital Signs Time Taken: 11:34  Temperature (F): 98.1 Height (in): 72 Pulse (bpm): 92 Weight (lbs): 185 Respiratory Rate (breaths/min): 18 Body Mass Index (BMI): 25.1 Blood Pressure (mmHg): 145/70 Capillary Blood Glucose (mg/dl): 098 Reference Range: 80 - 120 mg / dl Electronic Signature(s) Signed: 03/16/2023 3:21:56 PM By: Samuella Bruin Entered By: Samuella Bruin on 03/16/2023 11:35:09

## 2023-03-20 DIAGNOSIS — N186 End stage renal disease: Secondary | ICD-10-CM | POA: Diagnosis not present

## 2023-03-20 DIAGNOSIS — Z992 Dependence on renal dialysis: Secondary | ICD-10-CM | POA: Diagnosis not present

## 2023-03-20 DIAGNOSIS — N2581 Secondary hyperparathyroidism of renal origin: Secondary | ICD-10-CM | POA: Diagnosis not present

## 2023-03-22 ENCOUNTER — Other Ambulatory Visit: Payer: Self-pay | Admitting: Family Medicine

## 2023-03-22 ENCOUNTER — Other Ambulatory Visit: Payer: Self-pay

## 2023-03-22 DIAGNOSIS — N186 End stage renal disease: Secondary | ICD-10-CM | POA: Diagnosis not present

## 2023-03-22 DIAGNOSIS — N2581 Secondary hyperparathyroidism of renal origin: Secondary | ICD-10-CM | POA: Diagnosis not present

## 2023-03-22 DIAGNOSIS — Z992 Dependence on renal dialysis: Secondary | ICD-10-CM | POA: Diagnosis not present

## 2023-03-22 MED ORDER — ATORVASTATIN CALCIUM 80 MG PO TABS: 80.0000 mg | ORAL_TABLET | Freq: Every day | ORAL | 3 refills | Status: DC

## 2023-03-22 MED ORDER — AMLODIPINE BESYLATE 10 MG PO TABS: 10.0000 mg | ORAL_TABLET | Freq: Every day | ORAL | 3 refills | Status: DC

## 2023-03-22 NOTE — Telephone Encounter (Signed)
Prescription for  Carvedilol 6.25 mg sent to [facility]

## 2023-03-22 NOTE — Telephone Encounter (Signed)
Patient requesting prescription for amlodipine 10 mg and furosemide 80 mg  sent to  [facility], Refill on  atorvastatin (LIPITOR) 80 MG tablet  send to [facility]

## 2023-03-22 NOTE — Progress Notes (Signed)
Patient states carvedilol is given by the kidney doctor - he is on dyalisis and takes torsemide but but does not need any refills for those two right now.

## 2023-03-24 DIAGNOSIS — Z992 Dependence on renal dialysis: Secondary | ICD-10-CM | POA: Diagnosis not present

## 2023-03-24 DIAGNOSIS — N186 End stage renal disease: Secondary | ICD-10-CM | POA: Diagnosis not present

## 2023-03-24 DIAGNOSIS — N2581 Secondary hyperparathyroidism of renal origin: Secondary | ICD-10-CM | POA: Diagnosis not present

## 2023-03-26 ENCOUNTER — Encounter (HOSPITAL_BASED_OUTPATIENT_CLINIC_OR_DEPARTMENT_OTHER): Payer: Medicare PPO | Admitting: General Surgery

## 2023-03-26 ENCOUNTER — Telehealth: Payer: Self-pay | Admitting: Family Medicine

## 2023-03-26 DIAGNOSIS — F419 Anxiety disorder, unspecified: Secondary | ICD-10-CM | POA: Diagnosis not present

## 2023-03-26 DIAGNOSIS — E1122 Type 2 diabetes mellitus with diabetic chronic kidney disease: Secondary | ICD-10-CM | POA: Diagnosis not present

## 2023-03-26 DIAGNOSIS — L97514 Non-pressure chronic ulcer of other 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: Secondary | ICD-10-CM | POA: Diagnosis not present

## 2023-03-26 DIAGNOSIS — E1142 Type 2 diabetes mellitus with diabetic polyneuropathy: Secondary | ICD-10-CM | POA: Diagnosis not present

## 2023-03-26 DIAGNOSIS — Z992 Dependence on renal dialysis: Secondary | ICD-10-CM | POA: Diagnosis not present

## 2023-03-26 DIAGNOSIS — E1151 Type 2 diabetes mellitus with diabetic peripheral angiopathy without gangrene: Secondary | ICD-10-CM | POA: Diagnosis not present

## 2023-03-26 DIAGNOSIS — E11621 Type 2 diabetes mellitus with foot ulcer: Secondary | ICD-10-CM | POA: Diagnosis not present

## 2023-03-26 DIAGNOSIS — N186 End stage renal disease: Secondary | ICD-10-CM | POA: Diagnosis not present

## 2023-03-26 DIAGNOSIS — I120 Hypertensive chronic kidney disease with stage 5 chronic kidney disease or end stage renal disease: Secondary | ICD-10-CM | POA: Diagnosis not present

## 2023-03-26 DIAGNOSIS — L97516 Non-pressure chronic ulcer of other part of right foot with bone involvement without evidence of necrosis: Secondary | ICD-10-CM | POA: Diagnosis not present

## 2023-03-26 NOTE — Telephone Encounter (Signed)
Patient has refills and is awaiting PA through insurance

## 2023-03-26 NOTE — Progress Notes (Signed)
[name], [name] ([id number]) [id number] Page 1 of 8 Visit Report for 03/26/2023 Arrival Information Details Patient Name: Date of Service: [name], [name] 03/26/2023 11:00 A M Medical Record Number: [id number] Patient Account Number: [id number] Date of Birth/Sex: Treating RN: [birth year] (72 y.o. [name] Primary Care Laquasha Groome: [name] Other Other Clinician: Referring Alleah Dearman: Treating Anihya Tuma/Extender: [name] Weeks in Treatment: 23 Visit Information History Since Last Visit Added or deleted any medications: No Patient Arrived: Ambulatory Any new allergies or adverse reactions: No Arrival Time: 11:10 Had a fall or experienced change in No Accompanied By: self activities of daily living that may affect Transfer Assistance: None risk of falls: Patient Identification Verified: Yes Signs or symptoms of abuse/neglect since last visito No Patient Requires Transmission-Based Precautions: No Hospitalized since last visit: No Patient Has Alerts: No Implantable device outside of the clinic excluding No cellular tissue based products placed in the center since last visit: Has Dressing in Place as Prescribed: Yes Pain Present Now: No Electronic Signature(s) Signed: 03/26/2023 12:22:54 PM By: Karie Schwalbe RN Entered By: Karie Schwalbe on 03/26/2023 11:22:24 -------------------------------------------------------------------------------- Clinic Level of Care Assessment Details Patient Name: Date of Service: [name], [name] 03/26/2023 11:00 A M Medical Record Number: [id number] Patient Account Number: [id number] Date of Birth/Sex: Treating RN: 11/14/1950 (72 y.o. [name] Primary Care Tyriana Helmkamp: [name] Other Other Clinician: Referring Seraphim Trow: Treating Lev Cervone/Extender: [name] Weeks in Treatment: 23 Clinic Level of Care Assessment Items TOOL 4 Quantity Score X- 1 0 Use when only an EandM is  performed on FOLLOW-UP visit ASSESSMENTS - Nursing Assessment / Reassessment X- 1 10 Reassessment of Co-morbidities (includes updates in patient status) X- 1 5 Reassessment of Adherence to Treatment Plan ASSESSMENTS - Wound and Skin A ssessment / Reassessment X - Simple Wound Assessment / Reassessment - one wound 1 5 []  - 0 Complex Wound Assessment / Reassessment - multiple wounds []  - 0 Dermatologic / Skin Assessment (not related to wound area) ASSESSMENTS - Focused Assessment []  - 0 Circumferential Edema Measurements - multi extremities []  - 0 Nutritional Assessment / Counseling / Intervention ABBY, STINES ([id number]) [id number] Page 2 of 8 []  - 0 Lower Extremity Assessment (monofilament, tuning fork, pulses) []  - 0 Peripheral Arterial Disease Assessment (using hand held doppler) ASSESSMENTS - Ostomy and/or Continence Assessment and Care []  - 0 Incontinence Assessment and Management []  - 0 Ostomy Care Assessment and Management (repouching, etc.) PROCESS - Coordination of Care X - Simple Patient / Family Education for ongoing care 1 15 []  - 0 Complex (extensive) Patient / Family Education for ongoing care X- 1 10 Staff obtains Chiropractor, Records, T Results / Process Orders est X- 1 10 Staff telephones HHA, Nursing Homes / Clarify orders / etc []  - 0 Routine Transfer to another Facility (non-emergent condition) []  - 0 Routine [name] Admission (non-emergent condition) []  - 0 New Admissions / Manufacturing engineer / Ordering NPWT Apligraf, etc. , []  - 0 Emergency [name] Admission (emergent condition) X- 1 10 Simple Discharge Coordination []  - 0 Complex (extensive) Discharge Coordination PROCESS - Special Needs []  - 0 Pediatric / Minor Patient Management []  - 0 Isolation Patient Management []  - 0 Hearing / Language / Visual special needs []  - 0 Assessment of Community assistance (transportation, D/C planning, etc.) []  -  0 Additional assistance / Altered mentation []  - 0 Support Surface(s) Assessment (bed, cushion, seat, etc.) INTERVENTIONS - Wound Cleansing / Measurement X - Simple Wound Cleansing - one wound 1 5 []  -  0 Complex Wound Cleansing - multiple wounds X- 1 5 Wound Imaging (photographs - any number of wounds) []  - 0 Wound Tracing (instead of photographs) X- 1 5 Simple Wound Measurement - one wound []  - 0 Complex Wound Measurement - multiple wounds INTERVENTIONS - Wound Dressings X - Small Wound Dressing one or multiple wounds 1 10 []  - 0 Medium Wound Dressing one or multiple wounds []  - 0 Large Wound Dressing one or multiple wounds X- 1 5 Application of Medications - topical []  - 0 Application of Medications - injection INTERVENTIONS - Miscellaneous []  - 0 External ear exam []  - 0 Specimen Collection (cultures, biopsies, blood, body fluids, etc.) []  - 0 Specimen(s) / Culture(s) sent or taken to Lab for analysis []  - 0 Patient Transfer (multiple staff / Nurse, adult / Similar devices) []  - 0 Simple Staple / Suture removal (25 or less) []  - 0 Complex Staple / Suture removal (26 or more) []  - 0 Hypo / Hyperglycemic Management (close monitor of Blood Glucose) Bennick, [place] ([id number]) [id number] Page 3 of 8 []  - 0 Ankle / Brachial Index (ABI) - do not check if billed separately X- 1 5 Vital Signs Has the patient been seen at the [name] within the last three years: Yes Total Score: 100 Level Of Care: New/Established - Level 3 Electronic Signature(s) Signed: 03/26/2023 12:22:54 PM By: Karie Schwalbe RN Entered By: Karie Schwalbe on 03/26/2023 12:21:47 -------------------------------------------------------------------------------- Encounter Discharge Information Details Patient Name: Date of Service: [name], [name] 03/26/2023 11:00 A M Medical Record Number: [id number] Patient Account Number: [id number] Date of Birth/Sex: Treating  RN: July 25, 1951 (72 y.o. [name] Primary Care Shawndrea Rutkowski: [name] Other Other Clinician: Referring Jahira Swiss: Treating Nichele Slawson/Extender: [name] Weeks in Treatment: 23 Encounter Discharge Information Items Discharge Condition: Stable Ambulatory Status: Ambulatory Discharge Destination: Home Transportation: Private Auto Accompanied By: self Schedule Follow-up Appointment: Yes Clinical Summary of Care: Patient Declined Electronic Signature(s) Signed: 03/26/2023 12:22:54 PM By: Karie Schwalbe RN Entered By: Karie Schwalbe on 03/26/2023 12:22:28 -------------------------------------------------------------------------------- Lower Extremity Assessment Details Patient Name: Date of Service: [name], [name] 03/26/2023 11:00 A M Medical Record Number: [id number] Patient Account Number: [id number] Date of Birth/Sex: Treating RN: 1951-03-26 (72 y.o. [name] Primary Care June Rode: [name] Other Other Clinician: Referring Xavi Tomasik: Treating Elnita Surprenant/Extender: [name] Weeks in Treatment: 23 Edema Assessment Assessed: [Left: No] [Right: No] Edema: [Left: N] [Right: o] Calf Left: Right: Point of Measurement: From Medial Instep 36 cm Ankle Left: Right: Point of Measurement: From Medial Instep 23 cm Vascular Assessment Alwine, Oluwatomisin ([id number]) [Right:[id number] Page 4 of 8] Pulses: Dorsalis Pedis Palpable: [Right:Yes] Electronic Signature(s) Signed: 03/26/2023 12:22:54 PM By: Karie Schwalbe RN Entered By: Karie Schwalbe on 03/26/2023 11:26:46 -------------------------------------------------------------------------------- Multi Wound Chart Details Patient Name: Date of Service: [name], [name] 03/26/2023 11:00 A M Medical Record Number: [id number] Patient Account Number: [id number] Date of Birth/Sex: Treating RN: 26-Jan-1951 (72 y.o. M) Primary Care Ronni Osterberg: [name] Other Other Clinician: Referring  Yanixan Mellinger: Treating Amil Bouwman/Extender: [name] Weeks in Treatment: 23 Vital Signs Height(in): 72 Pulse(bpm): 97 Weight(lbs): 185 Blood Pressure(mmHg): 162/66 Body Mass Index(BMI): 25.1 Temperature(F): 98.1 Respiratory Rate(breaths/min): 18 [1:Photos:] [N/A:N/A] Right, Medial T Great oe N/A N/A Wound Location: Pressure Injury N/A N/A Wounding Event: Diabetic Wound/Ulcer of the Lower N/A N/A Primary Etiology: Extremity Acute Osteomyelitis N/A N/A Secondary Etiology: Cataracts, Hypertension, Type II N/A N/A Comorbid History: Diabetes, Osteomyelitis, Neuropathy, Confinement Anxiety 08/16/2022 N/A N/A Date Acquired: 57 N/A N/A Weeks of  Treatment: Open N/A N/A Wound Status: No N/A N/A Wound Recurrence: 0.1x0.1x0.2 N/A N/A Measurements L x W x D (cm) 0.008 N/A N/A A (cm) : rea 0.002 N/A N/A Volume (cm) : 97.10% N/A N/A % Reduction in A rea: 98.20% N/A N/A % Reduction in Volume: Grade 3 N/A N/A Classification: Small N/A N/A Exudate A mount: Serosanguineous N/A N/A Exudate Type: red, brown N/A N/A Exudate Color: Distinct, outline attached N/A N/A Wound Margin: Large (67-100%) N/A N/A Granulation A mount: Red N/A N/A Granulation Quality: Small (1-33%) N/A N/A Necrotic A mount: Fat Layer (Subcutaneous Tissue): Yes N/A N/A Exposed Structures: Bone: Yes Fascia: No Tendon: No Muscle: No Joint: No Medium (34-66%) N/A N/A Epithelialization: Callus: Yes N/A N/A Periwound Skin Texture: Maceration: Yes N/A N/A Periwound Skin Moisture: No Abnormalities Noted N/A N/A Periwound Skin ColorKEATS, KINGRY ([id number]) [id number] Page 5 of 8 No Abnormality N/A N/A Temperature: Treatment Notes Electronic Signature(s) Signed: 03/26/2023 11:48:49 AM By: [name] MD FACS Entered By: [name] on 03/26/2023  11:48:49 -------------------------------------------------------------------------------- Multi-Disciplinary Care Plan Details Patient Name: Date of Service: [name], [name] 03/26/2023 11:00 A M Medical Record Number: [id number] Patient Account Number: [id number] Date of Birth/Sex: Treating RN: 05-Oct-1950 (73 y.o. [name] Primary Care Cleaster Shiffer: [name] Other Other Clinician: Referring Sabriyah Wilcher: Treating Jamarria Real/Extender: [name] Weeks in Treatment: 23 Multidisciplinary Care Plan reviewed with physician Active Inactive Nutrition Nursing Diagnoses: Impaired glucose control: actual or potential Goals: Patient/caregiver will maintain therapeutic glucose control Date Initiated: 10/12/2022 Target Resolution Date: 05/02/2023 Goal Status: Active Interventions: Provide education on elevated blood sugars and impact on wound healing Provide education on nutrition Treatment Activities: Dietary management education, guidance and counseling : 10/12/2022 Giving encouragement to exercise : 10/12/2022 Notes: Osteomyelitis Nursing Diagnoses: Infection: osteomyelitis Goals: Patient/caregiver will verbalize understanding of disease process and disease management Date Initiated: 10/12/2022 Target Resolution Date: 05/02/2023 Goal Status: Active Interventions: Assess for signs and symptoms of osteomyelitis resolution every visit Provide education on osteomyelitis Treatment Activities: MRI : 08/16/2022 Notes: Electronic Signature(s) Signed: 03/26/2023 12:22:54 PM By: Karie Schwalbe RN Entered By: Karie Schwalbe on 03/26/2023 12:19:47 Cirilo, Kerrion ([id number]) [id number] Page 6 of 8 -------------------------------------------------------------------------------- Pain Assessment Details Patient Name: Date of ServiceDorise [name], [name] 03/26/2023 11:00 A M Medical Record Number: [id number] Patient Account Number: [id number] Date of Birth/Sex:  Treating RN: 1950/11/04 (72 y.o. [name] Primary Care Callin Ashe: [name] Other Other Clinician: Referring Astraea Gaughran: Treating Malkia Nippert/Extender: [name] Weeks in Treatment: 23 Active Problems Location of Pain Severity and Description of Pain Patient Has Paino No Site Locations Pain Management and Medication Current Pain Management: Electronic Signature(s) Signed: 03/26/2023 12:22:54 PM By: Karie Schwalbe RN Entered By: Karie Schwalbe on 03/26/2023 11:22:38 -------------------------------------------------------------------------------- Patient/Caregiver Education Details Patient Name: Date of Service: [name], [name] 6/24/2024andnbsp11:00 A M Medical Record Number: [id number] Patient Account Number: [id number] Date of Birth/Gender: Treating RN: 1951-08-03 (72 y.o. [name] Primary Care Physician: [name] Other Other Clinician: Referring Physician: Treating Physician/Extender: Gracy Racer in Treatment: 23 Education Assessment Education Provided To: Patient Education Topics Provided Wound/Skin Impairment: Methods: Explain/Verbal Responses: Return demonstration correctly [place], [name] ([id number]) [id number] Page 7 of 8 Electronic Signature(s) Signed: 03/26/2023 12:22:54 PM By: Karie Schwalbe RN Entered By: Karie Schwalbe on 03/26/2023 12:20:51 -------------------------------------------------------------------------------- Wound Assessment Details Patient Name: Date of Service: [name] [employer] [name] 03/26/2023 11:00 A M Medical Record Number: [id number] Patient Account Number: [id number] Date of Birth/Sex: Treating RN: Jun 14, 1951 (72 y.o. Judie Petit) Karie Schwalbe  Primary Care Tasnim Balentine: [name] Other Other Clinician: Referring Elmer Boutelle: Treating Angelo Caroll/Extender: [name] Weeks in Treatment: 23 Wound Status Wound Number: 1 Primary Diabetic Wound/Ulcer of the Lower  Extremity Etiology: Wound Location: Right, Medial T Great oe Secondary Acute Osteomyelitis Wounding Event: Pressure Injury Etiology: Date Acquired: 08/16/2022 Wound Open Weeks Of Treatment: 23 Status: Clustered Wound: No Comorbid Cataracts, Hypertension, Type II Diabetes, Osteomyelitis, History: Neuropathy, Confinement Anxiety Photos Wound Measurements Length: (cm) 0.1 Width: (cm) 0.1 Depth: (cm) 0.2 Area: (cm) 0.008 Volume: (cm) 0.002 % Reduction in Area: 97.1% % Reduction in Volume: 98.2% Epithelialization: Medium (34-66%) Tunneling: No Undermining: No Wound Description Classification: Grade 3 Wound Margin: Distinct, outline attached Exudate Amount: Small Exudate Type: Serosanguineous Exudate Color: red, brown Foul Odor After Cleansing: No Slough/Fibrino Yes Wound Bed Granulation Amount: Large (67-100%) Exposed Structure Granulation Quality: Red Fascia Exposed: No Necrotic Amount: Small (1-33%) Fat Layer (Subcutaneous Tissue) Exposed: Yes Necrotic Quality: Adherent Slough Tendon Exposed: No Muscle Exposed: No Joint Exposed: No Bone Exposed: Yes Periwound Skin Texture Texture Color No Abnormalities Noted: Yes No Abnormalities Noted: Yes Moisture Temperature / Pain Molloy, Josiel ([id number]) [id number] Page 8 of 8 No Abnormalities Noted: Yes Temperature: No Abnormality Treatment Notes Wound #1 (Toe Great) Wound Laterality: Right, Medial Cleanser Soap and Water Discharge Instruction: May shower and wash wound with dial antibacterial soap and water prior to dressing change. Wound Cleanser Discharge Instruction: Cleanse the wound with wound cleanser prior to applying a clean dressing using gauze sponges, not tissue or cotton balls. Peri-Wound Care Topical Gentamicin Discharge Instruction: Apply with Mupirocin (Mix Mupirocin and Getamicin ointment) Mupirocin Ointment Discharge Instruction: Apply Mupirocin (Bactroban) with  Gentamicinas instructed Primary Dressing Epicord Discharge Instruction: # 10 Last 03/16/23 Secondary Dressing Woven Gauze Sponges 2x2 in Discharge Instruction: Apply over primary dressing as directed. Secured With Conforming Stretch Gauze Bandage, Sterile 2x75 (in/in) Discharge Instruction: Secure with stretch gauze as directed. 33M Medipore Soft Cloth Surgical T 2x10 (in/yd) ape Discharge Instruction: Secure with tape as directed. Compression Wrap Compression Stockings Add-Ons Electronic Signature(s) Signed: 03/26/2023 12:22:54 PM By: Karie Schwalbe RN Entered By: Karie Schwalbe on 03/26/2023 11:35:27 -------------------------------------------------------------------------------- Vitals Details Patient Name: Date of Service: [name], [name] 03/26/2023 11:00 A M Medical Record Number: [id number] Patient Account Number: [id number] Date of Birth/Sex: Treating RN: 03-05-1951 (72 y.o. [name] Primary Care Nelia Rogoff: [name] Other Other Clinician: Referring Naika Noto: Treating Lillyanne Bradburn/Extender: [name] Weeks in Treatment: 23 Vital Signs Time Taken: 11:12 Temperature (F): 98.1 Height (in): 72 Pulse (bpm): 97 Weight (lbs): 185 Respiratory Rate (breaths/min): 18 Body Mass Index (BMI): 25.1 Blood Pressure (mmHg): 162/66 Reference Range: 80 - 120 mg / dl Electronic Signature(s) Signed: 03/26/2023 12:22:54 PM By: Karie Schwalbe RN Entered By: Karie Schwalbe on 03/26/2023 11:22:27

## 2023-03-26 NOTE — Telephone Encounter (Signed)
Refill on  Continuous Blood Gluc Sensor (FREESTYLE LIBRE 2 SENSOR) MISC  sent to [facility] patient states completely out

## 2023-03-26 NOTE — Progress Notes (Addendum)
[name], [name] ([id number]) [id number] Page 1 of 9 Visit Report for 03/26/2023 Chief Complaint Document Details Patient Name: Date of Service: [name], [name] 03/26/2023 11:00 A M Medical Record Number: [id number] Patient Account Number: [id number] Date of Birth/Sex: Treating RN: [birth year] (72 y.o. M) Primary Care Provider: [name] Other Other Clinician: Referring Provider: Treating Provider/Extender: [name] Weeks in Treatment: 23 Information Obtained from: Patient Chief Complaint Patients presents for treatment of an open diabetic ulcer on the right great toe Electronic Signature(s) Signed: 03/26/2023 11:48:54 AM By: [name] MD FACS Entered By: [name] on 03/26/2023 11:48:54 -------------------------------------------------------------------------------- HPI Details Patient Name: Date of Service: [name], [name] 03/26/2023 11:00 A M Medical Record Number: [id number] Patient Account Number: [id number] Date of Birth/Sex: Treating RN: [birth year] (72 y.o. M) Primary Care Provider: [name] Other Other Clinician: Referring Provider: Treating Provider/Extender: [name] Weeks in Treatment: 23 History of Present Illness HPI Description: ADMISSION 10/12/2022 This is a 72 year old type II diabetic (last hemoglobin A1c 7.7 in October 2023) with stage V chronic kidney disease and hypertension. He has been followed by podiatry for an ulcer on his right great toe. He has a history of prior toe and foot infections requiring amputation. In November 2023, he had an outpatient MRI that demonstrated osteomyelitis. He was admitted to the hospital for IV antibiotics and subsequently underwent irrigation and debridement of the toe with biopsy and placement of married 3 layer matrix. Cultures grew out MRSA and osteomyelitis was seen on pathology. He was followed by infectious disease and given a course of doxycycline and  Augmentin. He completed this course at the end of December. He has been evaluated by vascular surgery with formal ABIs as well as consultation. He does have adequate blood flow at least to the dorsalis pedis. He last saw podiatry on January 4 of this year where he underwent debridement. It was felt he would benefit from specialized wound care and he was referred to our [name] for further evaluation and management. On the dorsal aspect of his right great toe, there is a circular ulcer. There is a fair amount of undermining from callus and dry skin. The surface is a bit pale and fibrotic, but there is no obvious necrosis. 10/19/2022: He has been approved by his insurance to undergo hyperbaric oxygen therapy. EKG has been obtained and is normal. We are still waiting on his chest x-ray. The wound is clean with just a little bit of periwound callus and slough on the surface. Tendon is exposed. 10/26/2022: Unfortunately, the required co-pay from his insurance is cost prohibitive for him and he will not be able to receive hyperbaric oxygen therapy. Some moisture got under the dry skin around his wound and lifted it off but there is good epithelialized tissue beneath this. The exposed tendon has deteriorated. 11/03/2022: After some discussion with the financial office, it has become feasible for the patient to undergo hyperbaric oxygen therapy and he has received his first treatments this week. The wound is a little bit smaller and the tissue has a better color to it, but there is still extensive nonviable subcutaneous tissue and slough present. 11/09/2022: The wound measured a little bit smaller today. There is still slough and nonviable subcu tissue and tendon present. 11/16/2022: The wound continues to contract. There is still substantial depth to it, however, and bone remains exposed at the base. This is also a (early) 30-day update for HBO. 11/27/2022: Unfortunately, the wound has deteriorated. It appears that  the tissue has become macerated. There is a second ulcer on the lateral aspect of his great toe. The nail is lifting off of the bed and there is wet skin lifting off of the underlying tissue. The initial wound is smaller but still has a fair amount of depth to it. The patient does report quite a bit more drainage since his last visit. [name], [name] ([id number]) [id number] Page 2 of 9 12/20/2022: For scheduling reasons, somehow we have missed having a wound care visit in between his hyperbaric oxygen therapy treatments. The 2 openings of the wound have converged into 1 large site. There is necrotic tendon present at the lateral aspect of the great toe. The bone is visible, including the joint space. The area of the initial wound is flush with the surrounding skin. No malodor or purulent drainage. 12/28/2022: The wound is vastly improved this week. The 2 areas of the wound have separated due to tissue bridging and healing the space between. There is no more necrotic tissue visible, although the bone/joint space remains exposed. 01/04/2023: His wound continues to improve. The more medial aspect of the toe is nearly closed. Bone remains exposed laterally. He has been approved for Epicord. He is going to start dialysis next week. 01/11/2023: The medial aspect of the wound is closed completely. The lateral open portion of his wound has contracted and is shallower. There is still bone exposure. He has started dialysis. We are going to apply Epicord today. 01/19/2023: There is good granulation tissue starting to fill in his wound. Bone remains exposed, however. 01/26/2023: The wound is smaller again today. The granulation tissue is a bit hypertrophic. There is still an area of bone that remains exposed. 02/02/2023: The wound is about the same size but has better tissue coverage. There is a protruding point of bone present. His insurance has changed and we are needing to secure approval  again for his hyperbaric oxygen therapy treatments. 02/09/2023: The wound is quite a bit smaller today and there is robust granulation tissue on the surface. He has decided to discontinue his hyperbaric oxygen treatments due to the financial issues associated with traveling to the [facility] care [name] on a daily basis, as well as his dialysis sessions. 02/16/2023: The wound continues to contract. There is very nice granulation tissue present and no slough or other debris accumulation. 02/22/2023: The wound is quite a bit smaller today. There is still some depth to it and I can still probe bone. 03/02/2023: The wound has contracted further. There is granulation tissue filling in the base. There is still a narrow passage through which I can probe to bone. 03/09/2023: The wound is down to just a very small hole through which I can still probe to bone. The rest of the wound has epithelialized. 03/16/2023: No real change in the wound dimensions. Bone is still exposed at the base. The edges are rolling inward, suggesting the wound has stalled. 03/26/2023: The wound is about a millimeter in diameter. A skinny probe just barely fits through the opening but I still feel bone at the base. Electronic Signature(s) Signed: 03/26/2023 11:49:43 AM By: [name] MD FACS Entered By: [name] on 03/26/2023 11:49:43 -------------------------------------------------------------------------------- Physical Exam Details Patient Name: Date of Service: [name], [name] 03/26/2023 11:00 A M Medical Record Number: [id number] Patient Account Number: [id number] Date of Birth/Sex: Treating RN: [birth year] (72 y.o. M) Primary Care Provider: [name] Other Other Clinician: Referring Provider: Treating Provider/Extender: [name] Weeks in Treatment: [phone]  Constitutional Hypertensive, asymptomatic. . . . no acute distress. Respiratory Normal work of breathing on room air. Notes 03/26/2023: The wound is about  a millimeter in diameter. A skinny probe just barely fits through the opening but I still feel bone at the base. Electronic Signature(s) Signed: 03/26/2023 11:50:48 AM By: [name] MD FACS Entered By: [name] on 03/26/2023 11:50:48 Physician Orders Details -------------------------------------------------------------------------------- [name] ([id number]) [id number] Page 3 of 9 Patient Name: Date of ServiceDorise [name], [name] 03/26/2023 11:00 A M Medical Record Number: [id number] Patient Account Number: [id number] Date of Birth/Sex: Treating RN: Nov 13, 1950 (72 y.o. [name] Primary Care Provider: [name] Other Other Clinician: Referring Provider: Treating Provider/Extender: [name] Weeks in Treatment: 23 Verbal / Phone Orders: No Diagnosis Coding ICD-10 Coding Code Description L97.514 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.621 Type 2 diabetes mellitus with foot ulcer N18.6 End stage renal disease M86.9 Osteomyelitis, unspecified E11.42 Type 2 diabetes mellitus with diabetic polyneuropathy I10 Essential (primary) hypertension Follow-up Appointments ppointment in 1 week. - Dr. Lady Gary Rm 1 Return A Anesthetic Wound #1 Right,Medial T Great oe (In clinic) Topical Lidocaine 4% applied to wound bed Cellular or Tissue Based Products Cellular or Tissue Based Product Type: - epicord #10-Last 03/16/23 Cellular or Tissue Based Product applied to wound bed, secured with steri-strips, cover with Adaptic or Mepitel. (DO NOT REMOVE). Bathing/ Shower/ Hygiene May shower with protection but do not get wound dressing(s) wet. Protect dressing(s) with water repellant cover (for example, large plastic bag) or a cast cover and may then take shower. Off-Loading Wound #1 Right,Medial T Great oe Other: - Diabetic shoe Wound Treatment Wound #1 - T Great oe Wound Laterality: Right,  Medial Cleanser: Soap and Water 1 x Per Week/30 Days Discharge Instructions: May shower and wash wound with dial antibacterial soap and water prior to dressing change. Cleanser: Wound Cleanser 1 x Per Week/30 Days Discharge Instructions: Cleanse the wound with wound cleanser prior to applying a clean dressing using gauze sponges, not tissue or cotton balls. Topical: Gentamicin 1 x Per Week/30 Days Discharge Instructions: Apply with Mupirocin (Mix Mupirocin and Getamicin ointment) Topical: Mupirocin Ointment 1 x Per Week/30 Days Discharge Instructions: Apply Mupirocin (Bactroban) with Gentamicinas instructed Prim Dressing: Epicord 1 x Per Week/30 Days ary Discharge Instructions: # 10 Last 03/16/23 Secondary Dressing: Woven Gauze Sponges 2x2 in 1 x Per Week/30 Days Discharge Instructions: Apply over primary dressing as directed. Secured With: Insurance underwriter, Sterile 2x75 (in/in) 1 x Per Week/30 Days Discharge Instructions: Secure with stretch gauze as directed. Secured With: 16M Medipore Scientist, research (life sciences) Surgical T 2x10 (in/yd) 1 x Per Week/30 Days ape Discharge Instructions: Secure with tape as directed. Patient Medications llergies: losartan potassium, sildenafil A Notifications Medication Indication Start End 03/26/2023 gentamicin DOSE topical 0.1 % ointment - Apply to wound with daily dressing changes as directed 03/26/2023 mupirocin DOSE topical 2 % ointment - Apply to wound with daily dressing changes as directed ALEXSANDER, CAVINS ([id number]) [id number] Page 4 of 9 Electronic Signature(s) Signed: 03/26/2023 12:11:58 PM By: [name] MD FACS Previous Signature: 03/26/2023 11:52:16 AM Version By: [name] MD FACS Entered By: [name] on 03/26/2023 11:52:32 -------------------------------------------------------------------------------- Problem List Details Patient Name: Date of Service: [name], [name] 03/26/2023 11:00 A  M Medical Record Number: [id number] Patient Account Number: [id number] Date of Birth/Sex: Treating RN: 1951-08-09 (72 y.o. M) Primary Care Provider: [name] Other Other Clinician: Referring Provider: Treating Provider/Extender: Lady Gary  Jackquline [place], [name] Weeks in Treatment: 23 Active Problems ICD-10 Encounter Code Description Active Date MDM Diagnosis L97.514 Non-pressure chronic ulcer of other 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 10/12/2022 No Yes E11.621 Type 2 diabetes mellitus with foot ulcer 10/12/2022 No Yes N18.6 End stage renal disease 10/12/2022 No Yes M86.9 Osteomyelitis, unspecified 10/12/2022 No Yes E11.42 Type 2 diabetes mellitus with diabetic polyneuropathy 10/12/2022 No Yes I10 Essential (primary) hypertension 10/12/2022 No Yes Inactive Problems Resolved Problems Electronic Signature(s) Signed: 03/26/2023 11:48:40 AM By: [name] MD FACS Entered By: [name] on 03/26/2023 11:48:40 -------------------------------------------------------------------------------- Progress Note Details Patient Name: Date of Service: [name], [name] 03/26/2023 11:00 A [name], [name] ([id number]) [id number] Page 5 of 9 Medical Record Number: [id number] Patient Account Number: [id number] Date of Birth/Sex: Treating RN: [birth year] (72 y.o. M) Primary Care Provider: [name] Other Other Clinician: Referring Provider: Treating Provider/Extender: [name] Weeks in Treatment: 23 Subjective Chief Complaint Information obtained from Patient Patients presents for treatment of an open diabetic ulcer on the right great toe History of Present Illness (HPI) ADMISSION 10/12/2022 This is a 72 year old type II diabetic (last hemoglobin A1c 7.7 in October 2023) with stage V chronic kidney disease and hypertension. He has been followed by podiatry for an ulcer on his right great toe. He has a history of prior toe and foot infections requiring  amputation. In November 2023, he had an outpatient MRI that demonstrated osteomyelitis. He was admitted to the hospital for IV antibiotics and subsequently underwent irrigation and debridement of the toe with biopsy and placement of married 3 layer matrix. Cultures grew out MRSA and osteomyelitis was seen on pathology. He was followed by infectious disease and given a course of doxycycline and Augmentin. He completed this course at the end of December. He has been evaluated by vascular surgery with formal ABIs as well as consultation. He does have adequate blood flow at least to the dorsalis pedis. He last saw podiatry on January 4 of this year where he underwent debridement. It was felt he would benefit from specialized wound care and he was referred to our [name] for further evaluation and management. On the dorsal aspect of his right great toe, there is a circular ulcer. There is a fair amount of undermining from callus and dry skin. The surface is a bit pale and fibrotic, but there is no obvious necrosis. 10/19/2022: He has been approved by his insurance to undergo hyperbaric oxygen therapy. EKG has been obtained and is normal. We are still waiting on his chest x-ray. The wound is clean with just a little bit of periwound callus and slough on the surface. Tendon is exposed. 10/26/2022: Unfortunately, the required co-pay from his insurance is cost prohibitive for him and he will not be able to receive hyperbaric oxygen therapy. Some moisture got under the dry skin around his wound and lifted it off but there is good epithelialized tissue beneath this. The exposed tendon has deteriorated. 11/03/2022: After some discussion with the financial office, it has become feasible for the patient to undergo hyperbaric oxygen therapy and he has received his first treatments this week. The wound is a little bit smaller and the tissue has a better color to it, but there is still extensive nonviable subcutaneous tissue  and slough present. 11/09/2022: The wound measured a little bit smaller today. There is still slough and nonviable subcu tissue and tendon present. 11/16/2022: The wound continues to contract. There is still substantial depth to it, however,  and bone remains exposed at the base. This is also a (early) 30-day update for HBO. 11/27/2022: Unfortunately, the wound has deteriorated. It appears that the tissue has become macerated. There is a second ulcer on the lateral aspect of his great toe. The nail is lifting off of the bed and there is wet skin lifting off of the underlying tissue. The initial wound is smaller but still has a fair amount of depth to it. The patient does report quite a bit more drainage since his last visit. 12/20/2022: For scheduling reasons, somehow we have missed having a wound care visit in between his hyperbaric oxygen therapy treatments. The 2 openings of the wound have converged into 1 large site. There is necrotic tendon present at the lateral aspect of the great toe. The bone is visible, including the joint space. The area of the initial wound is flush with the surrounding skin. No malodor or purulent drainage. 12/28/2022: The wound is vastly improved this week. The 2 areas of the wound have separated due to tissue bridging and healing the space between. There is no more necrotic tissue visible, although the bone/joint space remains exposed. 01/04/2023: His wound continues to improve. The more medial aspect of the toe is nearly closed. Bone remains exposed laterally. He has been approved for Epicord. He is going to start dialysis next week. 01/11/2023: The medial aspect of the wound is closed completely. The lateral open portion of his wound has contracted and is shallower. There is still bone exposure. He has started dialysis. We are going to apply Epicord today. 01/19/2023: There is good granulation tissue starting to fill in his wound. Bone remains exposed, however. 01/26/2023: The  wound is smaller again today. The granulation tissue is a bit hypertrophic. There is still an area of bone that remains exposed. 02/02/2023: The wound is about the same size but has better tissue coverage. There is a protruding point of bone present. His insurance has changed and we are needing to secure approval again for his hyperbaric oxygen therapy treatments. 02/09/2023: The wound is quite a bit smaller today and there is robust granulation tissue on the surface. He has decided to discontinue his hyperbaric oxygen treatments due to the financial issues associated with traveling to the [facility] care [name] on a daily basis, as well as his dialysis sessions. 02/16/2023: The wound continues to contract. There is very nice granulation tissue present and no slough or other debris accumulation. 02/22/2023: The wound is quite a bit smaller today. There is still some depth to it and I can still probe bone. 03/02/2023: The wound has contracted further. There is granulation tissue filling in the base. There is still a narrow passage through which I can probe to bone. 03/09/2023: The wound is down to just a very small hole through which I can still probe to bone. The rest of the wound has epithelialized. 03/16/2023: No real change in the wound dimensions. Bone is still exposed at the base. The edges are rolling inward, suggesting the wound has stalled. 03/26/2023: The wound is about a millimeter in diameter. A skinny probe just barely fits through the opening but I still feel bone at the base. Patient History Information obtained from Patient. Family History Diabetes - Mother,Siblings, Heart Disease - Siblings, Hypertension - Siblings, Seizures - Father, Stroke - Siblings, No family history of Cancer, Hereditary Spherocytosis, Kidney Disease, Lung Disease, Thyroid Problems, Tuberculosis. Social History CONSTANT, [place]ILLE ([id number]) [id number] Page 6 of 9 Former smoker, Marital Status -  Married, Alcohol Use - Rarely, Drug Use - No History, Caffeine Use - Daily. Medical History Eyes Patient has history of Cataracts - rt eye Ear/Nose/Mouth/Throat Denies history of Chronic sinus problems/congestion Hematologic/Lymphatic Denies history of Anemia, Hemophilia, Human Immunodeficiency Virus, Lymphedema, Sickle Cell Disease Respiratory Denies history of Aspiration, Asthma, Chronic Obstructive Pulmonary Disease (COPD), Pneumothorax, Tuberculosis Cardiovascular Patient has history of Hypertension Gastrointestinal Denies history of Cirrhosis , Colitis, Crohns, Hepatitis A, Hepatitis B, Hepatitis C Endocrine Patient has history of Type II Diabetes Genitourinary Denies history of End Stage Renal Disease Immunological Denies history of Lupus Erythematosus, Raynauds, Scleroderma Integumentary (Skin) Denies history of History of Burn Musculoskeletal Patient has history of Osteomyelitis - MRI confirmed (08/16/22) Denies history of Gout Neurologic Patient has history of Neuropathy Psychiatric Patient has history of Confinement Anxiety Denies history of Anorexia/bulimia Hospitalization/Surgery History - AV fistula place left upper arm 10/20/22. Medical A Surgical History Notes nd Genitourinary CKD IV Objective Constitutional Hypertensive, asymptomatic. no acute distress. Vitals Time Taken: 11:12 AM, Height: 72 in, Weight: 185 lbs, BMI: 25.1, Temperature: 98.1 F, Pulse: 97 bpm, Respiratory Rate: 18 breaths/min, Blood Pressure: 162/66 mmHg. Respiratory Normal work of breathing on room air. General Notes: 03/26/2023: The wound is about a millimeter in diameter. A skinny probe just barely fits through the opening but I still feel bone at the base. Integumentary (Hair, Skin) Wound #1 status is Open. Original cause of wound was Pressure Injury. The date acquired was: 08/16/2022. The wound has been in treatment 23 weeks. The wound is located on the Right,Medial T Great. The wound  measures 0.1cm length x 0.1cm width x 0.2cm depth; 0.008cm^2 area and 0.002cm^3 volume. oe There is bone and Fat Layer (Subcutaneous Tissue) exposed. There is no tunneling or undermining noted. There is a small amount of serosanguineous drainage noted. The wound margin is distinct with the outline attached to the wound base. There is large (67-100%) red granulation within the wound bed. There is a small (1-33%) amount of necrotic tissue within the wound bed including Adherent Slough. The periwound skin appearance had no abnormalities noted for texture. The periwound skin appearance had no abnormalities noted for moisture. The periwound skin appearance had no abnormalities noted for color. Periwound temperature was noted as No Abnormality. Assessment Active Problems ICD-10 Non-pressure chronic ulcer of other 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 Type 2 diabetes mellitus with foot ulcer End stage renal disease Osteomyelitis, unspecified Type 2 diabetes mellitus with diabetic polyneuropathy Essential (primary) hypertension [name], [name] ([id number]) [id number] Page 7 of 9 Plan Follow-up Appointments: Return Appointment in 1 week. - Dr. Lady Gary Rm 1 Anesthetic: Wound #1 Right,Medial T Great: oe (In clinic) Topical Lidocaine 4% applied to wound bed Cellular or Tissue Based Products: Cellular or Tissue Based Product Type: - epicord #10-Last 03/16/23 Cellular or Tissue Based Product applied to wound bed, secured with steri-strips, cover with Adaptic or Mepitel. (DO NOT REMOVE). Bathing/ Shower/ Hygiene: May shower with protection but do not get wound dressing(s) wet. Protect dressing(s) with water repellant cover (for example, large plastic bag) or a cast cover and may then take shower. Off-Loading: Wound #1 Right,Medial T Great: oe Other: - Diabetic shoe The following medication(s) was prescribed: gentamicin topical 0.1 % ointment Apply to wound with daily  dressing changes as directed starting 03/26/2023 mupirocin topical 2 % ointment Apply to wound with daily dressing changes as directed starting 03/26/2023 WOUND #1: - T Great Wound Laterality: Right, Medial oe Cleanser: Soap and Water 1 x Per Week/30 Days  Discharge Instructions: May shower and wash wound with dial antibacterial soap and water prior to dressing change. Cleanser: Wound Cleanser 1 x Per Week/30 Days Discharge Instructions: Cleanse the wound with wound cleanser prior to applying a clean dressing using gauze sponges, not tissue or cotton balls. Topical: Gentamicin 1 x Per Week/30 Days Discharge Instructions: Apply with Mupirocin (Mix Mupirocin and Getamicin ointment) Topical: Mupirocin Ointment 1 x Per Week/30 Days Discharge Instructions: Apply Mupirocin (Bactroban) with Gentamicinas instructed Prim Dressing: Epicord 1 x Per Week/30 Days ary Discharge Instructions: # 10 Last 03/16/23 Secondary Dressing: Woven Gauze Sponges 2x2 in 1 x Per Week/30 Days Discharge Instructions: Apply over primary dressing as directed. Secured With: Insurance underwriter, Sterile 2x75 (in/in) 1 x Per Week/30 Days Discharge Instructions: Secure with stretch gauze as directed. Secured With: 9M [employer] Scientist, research (life sciences) Surgical T 2x10 (in/yd) 1 x Per Week/30 Days ape Discharge Instructions: Secure with tape as directed. 03/26/2023: The wound is about a millimeter in diameter. A skinny probe just barely fits through the opening but I still feel bone at the base. No debridement was necessary today. At this point, the opening to the wound is so small, that I do not think any of our wound dressing products is likely to fit. I am going to have him use a mixture of topical gentamicin and mupirocin and apply that to the wound daily and cover it with an occlusive dressing. He will follow-up in 1 week's time. Electronic Signature(s) Signed: 03/26/2023 11:53:23 AM By: [name] MD FACS Entered By:  [name] on 03/26/2023 11:53:23 -------------------------------------------------------------------------------- HxROS Details Patient Name: Date of Service: [name] [name], [name] 03/26/2023 11:00 A M Medical Record Number: [id number] Patient Account Number: [id number] Date of Birth/Sex: Treating RN: 1951/03/06 (72 y.o. M) Primary Care Provider: [name] Other Other Clinician: Referring Provider: Treating Provider/Extender: [name] Weeks in Treatment: 23 Information Obtained From Patient Eyes Medical History: Positive for: Cataracts - rt eye Ear/Nose/Mouth/Throat Medical History: Negative for: Chronic sinus problems/congestion [name], [name] ([id number]) [id number] Page 8 of 9 Hematologic/Lymphatic Medical History: Negative for: Anemia; Hemophilia; Human Immunodeficiency Virus; Lymphedema; Sickle Cell Disease Respiratory Medical History: Negative for: Aspiration; Asthma; Chronic Obstructive Pulmonary Disease (COPD); Pneumothorax; Tuberculosis Cardiovascular Medical History: Positive for: Hypertension Gastrointestinal Medical History: Negative for: Cirrhosis ; Colitis; Crohns; Hepatitis A; Hepatitis B; Hepatitis C Endocrine Medical History: Positive for: Type II Diabetes Time with diabetes: 2000 Treated with: Insulin Blood sugar tested every day: Yes Tested : Genitourinary Medical History: Negative for: End Stage Renal Disease Past Medical History Notes: CKD IV Immunological Medical History: Negative for: Lupus Erythematosus; Raynauds; Scleroderma Integumentary (Skin) Medical History: Negative for: History of Burn Musculoskeletal Medical History: Positive for: Osteomyelitis - MRI confirmed (08/16/22) Negative for: Gout Neurologic Medical History: Positive for: Neuropathy Psychiatric Medical History: Positive for: Confinement Anxiety Negative for: Anorexia/bulimia HBO Extended History  Items Eyes: Cataracts Immunizations Pneumococcal Vaccine: Received Pneumococcal Vaccination: Yes Received Pneumococcal Vaccination On or After 60th Birthday: Yes Implantable Devices None Hospitalization / Surgery History [name], GAIDA ([id number]) [id number] Page 9 of 9 Type of Hospitalization/Surgery AV fistula place left upper arm 10/20/22 Family and Social History Cancer: No; Diabetes: Yes - Mother,Siblings; Heart Disease: Yes - Siblings; Hereditary Spherocytosis: No; Hypertension: Yes - Siblings; Kidney Disease: No; Lung Disease: No; Seizures: Yes - Father; Stroke: Yes - Siblings; Thyroid Problems: No; Tuberculosis: No; Former smoker; Marital Status - Married; Alcohol Use: Rarely; Drug Use: No History; Caffeine Use: Daily; Financial Concerns: No; Food, Clothing or Shelter Needs: No;  Support System Lacking: No; Transportation Concerns: No Electronic Signature(s) Signed: 03/26/2023 12:11:58 PM By: [name] MD FACS Entered By: [name] on 03/26/2023 11:50:24 -------------------------------------------------------------------------------- SuperBill Details Patient Name: Date of Service: [name] [name], [name] 03/26/2023 Medical Record Number: [id number] Patient Account Number: [id number] Date of Birth/Sex: Treating RN: 03-31-1951 (71 y.o. M) Primary Care Provider: [name] Other Other Clinician: Referring Provider: Treating Provider/Extender: [name] Weeks in Treatment: 23 Diagnosis Coding ICD-10 Codes Code Description L97.514 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.621 Type 2 diabetes mellitus with foot ulcer N18.6 End stage renal disease M86.9 Osteomyelitis, unspecified E11.42 Type 2 diabetes mellitus with diabetic polyneuropathy I10 Essential (primary) hypertension Facility Procedures : CPT4 Code: 32355732 Description: 99213 - WOUND CARE VISIT-LEV 3 EST PT Modifier: Quantity:  1 Physician Procedures : CPT4 Code Description Modifier [id number] 99214 - WC PHYS LEVEL 4 - EST PT ICD-10 Diagnosis Description L97.514 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.621 Type 2 diabetes mellitus with foot ulcer M86.9  Osteomyelitis, unspecified N18.6 End stage renal disease Quantity: 1 Electronic Signature(s) Signed: 03/26/2023 12:22:54 PM By: Karie Schwalbe RN Signed: 03/26/2023 12:41:28 PM By: [name] MD FACS Previous Signature: 03/26/2023 11:53:39 AM Version By: [name] MD FACS Entered By: Karie Schwalbe on 03/26/2023 12:21:59

## 2023-03-27 DIAGNOSIS — N2581 Secondary hyperparathyroidism of renal origin: Secondary | ICD-10-CM | POA: Diagnosis not present

## 2023-03-27 DIAGNOSIS — Z992 Dependence on renal dialysis: Secondary | ICD-10-CM | POA: Diagnosis not present

## 2023-03-27 DIAGNOSIS — N186 End stage renal disease: Secondary | ICD-10-CM | POA: Diagnosis not present

## 2023-03-29 ENCOUNTER — Other Ambulatory Visit: Payer: Self-pay | Admitting: Family Medicine

## 2023-03-29 ENCOUNTER — Other Ambulatory Visit: Payer: Self-pay | Admitting: Pharmacist

## 2023-03-29 DIAGNOSIS — N2581 Secondary hyperparathyroidism of renal origin: Secondary | ICD-10-CM | POA: Diagnosis not present

## 2023-03-29 DIAGNOSIS — Z992 Dependence on renal dialysis: Secondary | ICD-10-CM | POA: Diagnosis not present

## 2023-03-29 DIAGNOSIS — N186 End stage renal disease: Secondary | ICD-10-CM | POA: Diagnosis not present

## 2023-03-29 MED ORDER — CARVEDILOL 6.25 MG PO TABS: 6.2500 mg | ORAL_TABLET | Freq: Two times a day (BID) | ORAL | 3 refills | Status: DC

## 2023-03-29 NOTE — Progress Notes (Addendum)
   03/29/2023 Name: Nylen Creque MRN: [id number] DOB: 08/02/1951  Chief Complaint  Patient presents with   Prior Armanda Heritage 2 CGM    PA completed & approved Please let patient know he can fill at the pharmacy [facility] 2 CGM via CoverMyMeds DX:E11.65  T2DM Medication regimen Novolog mix 70/30 20 units twice daily with meals Trulicity 4.5mg  weekly    Medication Access/Adherence  Current Pharmacy:  [facility] [address], [place] - [address] [place] [address] Phone: [phone] Fax: [phone]  Lab Results  Component Value Date   HGBA1C 6.8 (H) 11/10/2022     Kieth Brightly, PharmD, BCACP Clinical Pharmacist, [facility]

## 2023-03-30 NOTE — Telephone Encounter (Signed)
Telephone call no answer 

## 2023-03-30 NOTE — Telephone Encounter (Addendum)
PA completed & approved per Clinical pharmacist  Approval good through 10/02/2023

## 2023-04-01 DIAGNOSIS — N186 End stage renal disease: Secondary | ICD-10-CM | POA: Diagnosis not present

## 2023-04-01 DIAGNOSIS — Z992 Dependence on renal dialysis: Secondary | ICD-10-CM | POA: Diagnosis not present

## 2023-04-02 ENCOUNTER — Encounter (HOSPITAL_BASED_OUTPATIENT_CLINIC_OR_DEPARTMENT_OTHER): Payer: Medicare PPO | Attending: General Surgery | Admitting: General Surgery

## 2023-04-02 DIAGNOSIS — I120 Hypertensive chronic kidney disease with stage 5 chronic kidney disease or end stage renal disease: Secondary | ICD-10-CM | POA: Insufficient documentation

## 2023-04-02 DIAGNOSIS — E1151 Type 2 diabetes mellitus with diabetic peripheral angiopathy without gangrene: Secondary | ICD-10-CM | POA: Diagnosis not present

## 2023-04-02 DIAGNOSIS — N186 End stage renal disease: Secondary | ICD-10-CM | POA: Diagnosis not present

## 2023-04-02 DIAGNOSIS — E11621 Type 2 diabetes mellitus with foot ulcer: Secondary | ICD-10-CM | POA: Diagnosis not present

## 2023-04-02 DIAGNOSIS — Z833 Family history of diabetes mellitus: Secondary | ICD-10-CM | POA: Diagnosis not present

## 2023-04-02 DIAGNOSIS — E1122 Type 2 diabetes mellitus with diabetic chronic kidney disease: Secondary | ICD-10-CM | POA: Diagnosis not present

## 2023-04-02 DIAGNOSIS — L97514 Non-pressure chronic ulcer of other 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: Secondary | ICD-10-CM | POA: Insufficient documentation

## 2023-04-02 DIAGNOSIS — Z992 Dependence on renal dialysis: Secondary | ICD-10-CM | POA: Diagnosis not present

## 2023-04-02 DIAGNOSIS — L89890 Pressure ulcer of other site, unstageable: Secondary | ICD-10-CM | POA: Diagnosis not present

## 2023-04-02 DIAGNOSIS — N2581 Secondary hyperparathyroidism of renal origin: Secondary | ICD-10-CM | POA: Diagnosis not present

## 2023-04-02 NOTE — Progress Notes (Signed)
[name], [name] ([id number]) [id number] Page 1 of 9 Visit Report for 04/02/2023 Chief Complaint Document Details Patient Name: Date of Service: [name], [name] 04/02/2023 11:15 A M Medical Record Number: [id number] Patient Account Number: [id number] Date of Birth/Sex: Treating RN: 04/29/1951 (72 y.o. M) Primary Care Provider: [name] Other Other Clinician: Referring Provider: Treating Provider/Extender: [name] in Treatment: 24 Information Obtained from: Patient Chief Complaint Patients presents for treatment of an open diabetic ulcer on the right great toe Electronic Signature(s) Signed: 04/02/2023 11:55:26 AM By: [name] Guess MD FACS Entered By: [name] on 04/02/2023 11:55:26 -------------------------------------------------------------------------------- HPI Details Patient Name: Date of Service: [name], [name] 04/02/2023 11:15 A M Medical Record Number: [id number] Patient Account Number: [id number] Date of Birth/Sex: Treating RN: [birth year] (72 y.o. M) Primary Care Provider: [name] Other Other Clinician: Referring Provider: Treating Provider/Extender: [name] in Treatment: 24 History of Present Illness HPI Description: ADMISSION 10/12/2022 This is a 72 year old type II diabetic (last hemoglobin A1c 7.7 in October 2023) with stage V chronic kidney disease and hypertension. He has been followed by podiatry for an ulcer on his right great toe. He has a history of prior toe and foot infections requiring amputation. In November 2023, he had an outpatient MRI that demonstrated osteomyelitis. He was admitted to the hospital for IV antibiotics and subsequently underwent irrigation and debridement of the toe with biopsy and placement of married 3 layer matrix. Cultures grew out MRSA and osteomyelitis was seen on pathology. He was followed by infectious disease and given a course of doxycycline and  Augmentin. He completed this course at the end of December. He has been evaluated by vascular surgery with formal ABIs as well as consultation. He does have adequate blood flow at least to the dorsalis pedis. He last saw podiatry on January 4 of this year where he underwent debridement. It was felt he would benefit from specialized wound care and he was referred to our center for further evaluation and management. On the dorsal aspect of his right great toe, there is a circular ulcer. There is a fair amount of undermining from callus and dry skin. The surface is a bit pale and fibrotic, but there is no obvious necrosis. 10/19/2022: He has been approved by his insurance to undergo hyperbaric oxygen therapy. EKG has been obtained and is normal. We are still waiting on his chest x-ray. The wound is clean with just a little bit of periwound callus and slough on the surface. Tendon is exposed. 10/26/2022: Unfortunately, the required co-pay from his insurance is cost prohibitive for him and he will not be able to receive hyperbaric oxygen therapy. Some moisture got under the dry skin around his wound and lifted it off but there is good epithelialized tissue beneath this. The exposed tendon has deteriorated. 11/03/2022: After some discussion with the financial office, it has become feasible for the patient to undergo hyperbaric oxygen therapy and he has received his first treatments this week. The wound is a little bit smaller and the tissue has a better color to it, but there is still extensive nonviable subcutaneous tissue and slough present. 11/09/2022: The wound measured a little bit smaller today. There is still slough and nonviable subcu tissue and tendon present. 11/16/2022: The wound continues to contract. There is still substantial depth to it, however, and bone remains exposed at the base. This is also a (early) 30-day update for HBO. 11/27/2022: Unfortunately, the wound has deteriorated. It appears that  the tissue has become macerated. There is a second ulcer on the lateral aspect of his great toe. The nail is lifting off of the bed and there is wet skin lifting off of the underlying tissue. The initial wound is smaller but still has a fair amount of depth to it. The patient does report quite a bit more drainage since his last visit. [name], [name] ([id number]) [id number] Page 2 of 9 12/20/2022: For scheduling reasons, somehow we have missed having a wound care visit in between his hyperbaric oxygen therapy treatments. The 2 openings of the wound have converged into 1 large site. There is necrotic tendon present at the lateral aspect of the great toe. The bone is visible, including the joint space. The area of the initial wound is flush with the surrounding skin. No malodor or purulent drainage. 12/28/2022: The wound is vastly improved this week. The 2 areas of the wound have separated due to tissue bridging and healing the space between. There is no more necrotic tissue visible, although the bone/joint space remains exposed. 01/04/2023: His wound continues to improve. The more medial aspect of the toe is nearly closed. Bone remains exposed laterally. He has been approved for Epicord. He is going to start dialysis next week. 01/11/2023: The medial aspect of the wound is closed completely. The lateral open portion of his wound has contracted and is shallower. There is still bone exposure. He has started dialysis. We are going to apply Epicord today. 01/19/2023: There is good granulation tissue starting to fill in his wound. Bone remains exposed, however. 01/26/2023: The wound is smaller again today. The granulation tissue is a bit hypertrophic. There is still an area of bone that remains exposed. 02/02/2023: The wound is about the same size but has better tissue coverage. There is a protruding point of bone present. His insurance has changed and we are needing to secure approval  again for his hyperbaric oxygen therapy treatments. 02/09/2023: The wound is quite a bit smaller today and there is robust granulation tissue on the surface. He has decided to discontinue his hyperbaric oxygen treatments due to the financial issues associated with traveling to the wound care center on a daily basis, as well as his dialysis sessions. 02/16/2023: The wound continues to contract. There is very nice granulation tissue present and no slough or other debris accumulation. 02/22/2023: The wound is quite a bit smaller today. There is still some depth to it and I can still probe bone. 03/02/2023: The wound has contracted further. There is granulation tissue filling in the base. There is still a narrow passage through which I can probe to bone. 03/09/2023: The wound is down to just a very small hole through which I can still probe to bone. The rest of the wound has epithelialized. 03/16/2023: No real change in the wound dimensions. Bone is still exposed at the base. The edges are rolling inward, suggesting the wound has stalled. 03/26/2023: The wound is about a millimeter in diameter. A skinny probe just barely fits through the opening but I still feel bone at the base. 04/02/2023: The wound is just a tiny bit larger today. Bone is still able to be probed at the base. Electronic Signature(s) Signed: 04/02/2023 11:56:03 AM By: [name] Guess MD FACS Entered By: [name] on 04/02/2023 11:56:03 -------------------------------------------------------------------------------- Physical Exam Details Patient Name: Date of Service: [facility] [name], [name] 04/02/2023 11:15 A M Medical Record Number: [id number] Patient Account Number: [id number] Date of Birth/Sex: Treating RN: [birth year] (71 y.o.  M) Primary Care Provider: [name] Other Other Clinician: Referring Provider: Treating Provider/Extender: [name] in Treatment: 24 Constitutional Hypertensive, asymptomatic. . . . no acute  distress. Respiratory Normal work of breathing on room air. Notes 04/02/2023: The wound is just a tiny bit larger today. Bone is still able to be probed at the base. Electronic Signature(s) Signed: 04/02/2023 11:56:56 AM By: [name] Guess MD FACS Entered By: [name] on 04/02/2023 11:56:56 [name], [name] ([id number]) [id number] Page 3 of 9 -------------------------------------------------------------------------------- Physician Orders Details Patient Name: Date of Service: [facility] [name], [name] 04/02/2023 11:15 A M Medical Record Number: [id number] Patient Account Number: [id number] Date of Birth/Sex: Treating RN: [birth year] (72 y.o. Cline Cools Primary Care Provider: [name] Other Other Clinician: Referring Provider: Treating Provider/Extender: [name] in Treatment: 24 Verbal / Phone Orders: No Diagnosis Coding ICD-10 Coding Code Description L97.514 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.621 Type 2 diabetes mellitus with foot ulcer N18.6 End stage renal disease M86.9 Osteomyelitis, unspecified E11.42 Type 2 diabetes mellitus with diabetic polyneuropathy I10 Essential (primary) hypertension Follow-up Appointments ppointment in 1 week. - Dr. Lady Gary Rm 1 Return A Anesthetic Wound #1 Right,Medial T Great oe (In clinic) Topical Lidocaine 4% applied to wound bed Cellular or Tissue Based Products Cellular or Tissue Based Product Type: - epicord #10-Last 03/16/23 Bathing/ Shower/ Hygiene May shower and wash wound with soap and water. Off-Loading Wound #1 Right,Medial T Great oe Other: - Diabetic shoe Wound Treatment Wound #1 - T Great oe Wound Laterality: Right, Medial Cleanser: Soap and Water 1 x Per Week/30 Days Discharge Instructions: May shower and wash wound with dial antibacterial soap and water prior to dressing change. Cleanser: Wound Cleanser 1 x Per Week/30  Days Discharge Instructions: Cleanse the wound with wound cleanser prior to applying a clean dressing using gauze sponges, not tissue or cotton balls. Topical: Gentamicin 1 x Per Week/30 Days Discharge Instructions: Apply with Mupirocin (Mix Mupirocin and Getamicin ointment) Topical: Mupirocin Ointment 1 x Per Week/30 Days Discharge Instructions: Apply Mupirocin (Bactroban) with Gentamicinas instructed Prim Dressing: Plain packing strip 1/4 (in) 1 x Per Week/30 Days ary Discharge Instructions: Lightly pack as instructed Secondary Dressing: Woven Gauze Sponges 2x2 in 1 x Per Week/30 Days Discharge Instructions: Apply over primary dressing as directed. Secured With: Insurance underwriter, Sterile 2x75 (in/in) 1 x Per Week/30 Days Discharge Instructions: Secure with stretch gauze as directed. Secured With: 33M Medipore Scientist, research (life sciences) Surgical T 2x10 (in/yd) 1 x Per Week/30 Days ape Discharge Instructions: Secure with tape as directed. Patient Medications llergies: losartan potassium, sildenafil A Notifications Medication Indication Start End 04/02/2023 lidocaine DOSE topical 4 % cream - cream topical once daily [name], [name] ([id number]) [phone].pdf Page 4 of 9 Electronic Signature(s) Signed: 04/02/2023 12:21:19 PM By: [name] Guess MD FACS Entered By: [name] on 04/02/2023 11:57:12 -------------------------------------------------------------------------------- Problem List Details Patient Name: Date of Service: [facility], [name] 04/02/2023 11:15 A M Medical Record Number: [id number] Patient Account Number: [id number] Date of Birth/Sex: Treating RN: 24-Dec-1950 (72 y.o. M) Primary Care Provider: [name] Other Other Clinician: Referring Provider: Treating Provider/Extender: [name] in Treatment: 24 Active Problems ICD-10 Encounter Code Description Active Date MDM Diagnosis L97.514 Non-pressure chronic ulcer of other  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 10/12/2022 No Yes E11.621 Type 2 diabetes mellitus with foot ulcer 10/12/2022 No Yes N18.6 End stage renal disease 10/12/2022 No Yes M86.9 Osteomyelitis, unspecified 10/12/2022  No Yes E11.42 Type 2 diabetes mellitus with diabetic polyneuropathy 10/12/2022 No Yes I10 Essential (primary) hypertension 10/12/2022 No Yes Inactive Problems Resolved Problems Electronic Signature(s) Signed: 04/02/2023 11:53:59 AM By: [name] Guess MD FACS Entered By: [name] on 04/02/2023 11:53:59 -------------------------------------------------------------------------------- Progress Note Details Patient Name: Date of Service: [facility] [name], [name] 04/02/2023 11:15 A M Medical Record Number: [id number] Patient Account Number: [id number] Date of Birth/Sex: Treating RN: [birth year] (72 y.o. [name], [name] ([id number]) [id number] Page 5 of 9 Primary Care Provider: [name] Other Other Clinician: Referring Provider: Treating Provider/Extender: [name] in Treatment: 24 Subjective Chief Complaint Information obtained from Patient Patients presents for treatment of an open diabetic ulcer on the right great toe History of Present Illness (HPI) ADMISSION 10/12/2022 This is a 72 year old type II diabetic (last hemoglobin A1c 7.7 in October 2023) with stage V chronic kidney disease and hypertension. He has been followed by podiatry for an ulcer on his right great toe. He has a history of prior toe and foot infections requiring amputation. In November 2023, he had an outpatient MRI that demonstrated osteomyelitis. He was admitted to the hospital for IV antibiotics and subsequently underwent irrigation and debridement of the toe with biopsy and placement of married 3 layer matrix. Cultures grew out MRSA and osteomyelitis was seen on pathology. He was followed by infectious disease and given a course of doxycycline and Augmentin.  He completed this course at the end of December. He has been evaluated by vascular surgery with formal ABIs as well as consultation. He does have adequate blood flow at least to the dorsalis pedis. He last saw podiatry on January 4 of this year where he underwent debridement. It was felt he would benefit from specialized wound care and he was referred to our center for further evaluation and management. On the dorsal aspect of his right great toe, there is a circular ulcer. There is a fair amount of undermining from callus and dry skin. The surface is a bit pale and fibrotic, but there is no obvious necrosis. 10/19/2022: He has been approved by his insurance to undergo hyperbaric oxygen therapy. EKG has been obtained and is normal. We are still waiting on his chest x-ray. The wound is clean with just a little bit of periwound callus and slough on the surface. Tendon is exposed. 10/26/2022: Unfortunately, the required co-pay from his insurance is cost prohibitive for him and he will not be able to receive hyperbaric oxygen therapy. Some moisture got under the dry skin around his wound and lifted it off but there is good epithelialized tissue beneath this. The exposed tendon has deteriorated. 11/03/2022: After some discussion with the financial office, it has become feasible for the patient to undergo hyperbaric oxygen therapy and he has received his first treatments this week. The wound is a little bit smaller and the tissue has a better color to it, but there is still extensive nonviable subcutaneous tissue and slough present. 11/09/2022: The wound measured a little bit smaller today. There is still slough and nonviable subcu tissue and tendon present. 11/16/2022: The wound continues to contract. There is still substantial depth to it, however, and bone remains exposed at the base. This is also a (early) 30-day update for HBO. 11/27/2022: Unfortunately, the wound has deteriorated. It appears that the tissue  has become macerated. There is a second ulcer on the lateral aspect of his great toe. The nail is lifting off of the bed and there is wet skin lifting  off of the underlying tissue. The initial wound is smaller but still has a fair amount of depth to it. The patient does report quite a bit more drainage since his last visit. 12/20/2022: For scheduling reasons, somehow we have missed having a wound care visit in between his hyperbaric oxygen therapy treatments. The 2 openings of the wound have converged into 1 large site. There is necrotic tendon present at the lateral aspect of the great toe. The bone is visible, including the joint space. The area of the initial wound is flush with the surrounding skin. No malodor or purulent drainage. 12/28/2022: The wound is vastly improved this week. The 2 areas of the wound have separated due to tissue bridging and healing the space between. There is no more necrotic tissue visible, although the bone/joint space remains exposed. 01/04/2023: His wound continues to improve. The more medial aspect of the toe is nearly closed. Bone remains exposed laterally. He has been approved for Epicord. He is going to start dialysis next week. 01/11/2023: The medial aspect of the wound is closed completely. The lateral open portion of his wound has contracted and is shallower. There is still bone exposure. He has started dialysis. We are going to apply Epicord today. 01/19/2023: There is good granulation tissue starting to fill in his wound. Bone remains exposed, however. 01/26/2023: The wound is smaller again today. The granulation tissue is a bit hypertrophic. There is still an area of bone that remains exposed. 02/02/2023: The wound is about the same size but has better tissue coverage. There is a protruding point of bone present. His insurance has changed and we are needing to secure approval again for his hyperbaric oxygen therapy treatments. 02/09/2023: The wound is quite a bit  smaller today and there is robust granulation tissue on the surface. He has decided to discontinue his hyperbaric oxygen treatments due to the financial issues associated with traveling to the wound care center on a daily basis, as well as his dialysis sessions. 02/16/2023: The wound continues to contract. There is very nice granulation tissue present and no slough or other debris accumulation. 02/22/2023: The wound is quite a bit smaller today. There is still some depth to it and I can still probe bone. 03/02/2023: The wound has contracted further. There is granulation tissue filling in the base. There is still a narrow passage through which I can probe to bone. 03/09/2023: The wound is down to just a very small hole through which I can still probe to bone. The rest of the wound has epithelialized. 03/16/2023: No real change in the wound dimensions. Bone is still exposed at the base. The edges are rolling inward, suggesting the wound has stalled. 03/26/2023: The wound is about a millimeter in diameter. A skinny probe just barely fits through the opening but I still feel bone at the base. 04/02/2023: The wound is just a tiny bit larger today. Bone is still able to be probed at the base. Patient History Information obtained from Patient. Family History Diabetes - Mother,Siblings, Heart Disease - Siblings, Hypertension - Siblings, Seizures - Father, Stroke - Siblings, No family history of Cancer, Hereditary Spherocytosis, Kidney Disease, Lung Disease, Thyroid Problems, Tuberculosis. Social History OLAV, ZAWADA ([id number]) [id number] Page 6 of 9 Former smoker, Marital Status - Married, Alcohol Use - Rarely, Drug Use - No History, Caffeine Use - Daily. Medical History Eyes Patient has history of Cataracts - rt eye Ear/Nose/Mouth/Throat Denies history of Chronic sinus problems/congestion Hematologic/Lymphatic Denies history of Anemia, Hemophilia,  Human Immunodeficiency Virus,  Lymphedema, Sickle Cell Disease Respiratory Denies history of Aspiration, Asthma, Chronic Obstructive Pulmonary Disease (COPD), Pneumothorax, Tuberculosis Cardiovascular Patient has history of Hypertension Gastrointestinal Denies history of Cirrhosis , Colitis, Crohns, Hepatitis A, Hepatitis B, Hepatitis C Endocrine Patient has history of Type II Diabetes Genitourinary Denies history of End Stage Renal Disease Immunological Denies history of Lupus Erythematosus, Raynauds, Scleroderma Integumentary (Skin) Denies history of History of Burn Musculoskeletal Patient has history of Osteomyelitis - MRI confirmed (08/16/22) Denies history of Gout Neurologic Patient has history of Neuropathy Psychiatric Patient has history of Confinement Anxiety Denies history of Anorexia/bulimia Hospitalization/Surgery History - AV fistula place left upper arm 10/20/22. Medical A Surgical History Notes nd Genitourinary CKD IV Objective Constitutional Hypertensive, asymptomatic. no acute distress. Vitals Time Taken: 11:26 AM, Height: 72 in, Weight: 185 lbs, BMI: 25.1, Temperature: 98.2 F, Pulse: 86 bpm, Respiratory Rate: 18 breaths/min, Blood Pressure: 151/66 mmHg. Respiratory Normal work of breathing on room air. General Notes: 04/02/2023: The wound is just a tiny bit larger today. Bone is still able to be probed at the base. Integumentary (Hair, Skin) Wound #1 status is Open. Original cause of wound was Pressure Injury. The date acquired was: 08/16/2022. The wound has been in treatment 24 [name]. The wound is located on the Right,Medial T Great. The wound measures 0.2cm length x 0.2cm width x 0.2cm depth; 0.031cm^2 area and 0.006cm^3 volume. oe There is bone and Fat Layer (Subcutaneous Tissue) exposed. There is no tunneling or undermining noted. There is a small amount of serosanguineous drainage noted. The wound margin is distinct with the outline attached to the wound base. There is large (67-100%)  red granulation within the wound bed. There is a small (1-33%) amount of necrotic tissue within the wound bed including Adherent Slough. The periwound skin appearance had no abnormalities noted for texture. The periwound skin appearance had no abnormalities noted for moisture. The periwound skin appearance had no abnormalities noted for color. Periwound temperature was noted as No Abnormality. Assessment Active Problems ICD-10 Non-pressure chronic ulcer of other 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 Type 2 diabetes mellitus with foot ulcer End stage renal disease Osteomyelitis, unspecified Type 2 diabetes mellitus with diabetic polyneuropathy Essential (primary) hypertension [name], [name] ([id number]) [id number] Page 7 of 9 Plan Follow-up Appointments: Return Appointment in 1 week. - Dr. Lady Gary Rm 1 Anesthetic: Wound #1 Right,Medial T Great: oe (In clinic) Topical Lidocaine 4% applied to wound bed Cellular or Tissue Based Products: Cellular or Tissue Based Product Type: - epicord #10-Last 03/16/23 Bathing/ Shower/ Hygiene: May shower and wash wound with soap and water. Off-Loading: Wound #1 Right,Medial T Great: oe Other: - Diabetic shoe The following medication(s) was prescribed: lidocaine topical 4 % cream cream topical once daily was prescribed at facility WOUND #1: - T Great Wound Laterality: Right, Medial oe Cleanser: Soap and Water 1 x Per Week/30 Days Discharge Instructions: May shower and wash wound with dial antibacterial soap and water prior to dressing change. Cleanser: Wound Cleanser 1 x Per Week/30 Days Discharge Instructions: Cleanse the wound with wound cleanser prior to applying a clean dressing using gauze sponges, not tissue or cotton balls. Topical: Gentamicin 1 x Per Week/30 Days Discharge Instructions: Apply with Mupirocin (Mix Mupirocin and Getamicin ointment) Topical: Mupirocin Ointment 1 x Per Week/30 Days Discharge  Instructions: Apply Mupirocin (Bactroban) with Gentamicinas instructed Prim Dressing: Plain packing strip 1/4 (in) 1 x Per Week/30 Days ary Discharge Instructions: Lightly pack as instructed Secondary Dressing: Woven Gauze Sponges  2x2 in 1 x Per Week/30 Days Discharge Instructions: Apply over primary dressing as directed. Secured With: Insurance underwriter, Sterile 2x75 (in/in) 1 x Per Week/30 Days Discharge Instructions: Secure with stretch gauze as directed. Secured With: 11M Medipore Scientist, research (life sciences) Surgical T 2x10 (in/yd) 1 x Per Week/30 Days ape Discharge Instructions: Secure with tape as directed. 04/02/2023: The wound is just a tiny bit larger today. Bone is still able to be probed at the base. No debridement was necessary today. The wound is large enough now that I think we can get some quarter inch packing strip material into the site. We will continue to use the topical gentamicin and mupirocin as well. We are going to check his insurance to see if they might pay for Oasis, which I think will soften up enough with reconstitution to be able to get it into the small opening. Follow-up in 1 week. Electronic Signature(s) Signed: 04/02/2023 11:58:41 AM By: [name] Guess MD FACS Entered By: [name] on 04/02/2023 11:58:41 -------------------------------------------------------------------------------- HxROS Details Patient Name: Date of Service: [facility] [name], [name] 04/02/2023 11:15 A M Medical Record Number: [id number] Patient Account Number: [id number] Date of Birth/Sex: Treating RN: 1951-01-17 (72 y.o. M) Primary Care Provider: [name] Other Other Clinician: Referring Provider: Treating Provider/Extender: [name] in Treatment: 24 Information Obtained From Patient Eyes Medical History: Positive for: Cataracts - rt eye Ear/Nose/Mouth/Throat Medical History: Negative for: Chronic sinus problems/congestion [name], [name] ([id number])  [phone].pdf Page 8 of 9 Hematologic/Lymphatic Medical History: Negative for: Anemia; Hemophilia; Human Immunodeficiency Virus; Lymphedema; Sickle Cell Disease Respiratory Medical History: Negative for: Aspiration; Asthma; Chronic Obstructive Pulmonary Disease (COPD); Pneumothorax; Tuberculosis Cardiovascular Medical History: Positive for: Hypertension Gastrointestinal Medical History: Negative for: Cirrhosis ; Colitis; Crohns; Hepatitis A; Hepatitis B; Hepatitis C Endocrine Medical History: Positive for: Type II Diabetes Time with diabetes: 2000 Treated with: Insulin Blood sugar tested every day: Yes Tested : Genitourinary Medical History: Negative for: End Stage Renal Disease Past Medical History Notes: CKD IV Immunological Medical History: Negative for: Lupus Erythematosus; Raynauds; Scleroderma Integumentary (Skin) Medical History: Negative for: History of Burn Musculoskeletal Medical History: Positive for: Osteomyelitis - MRI confirmed (08/16/22) Negative for: Gout Neurologic Medical History: Positive for: Neuropathy Psychiatric Medical History: Positive for: Confinement Anxiety Negative for: Anorexia/bulimia HBO Extended History Items Eyes: Cataracts Immunizations Pneumococcal Vaccine: Received Pneumococcal Vaccination: Yes Received Pneumococcal Vaccination On or After 60th Birthday: Yes Implantable Devices None Hospitalization / Surgery History Type of Hospitalization/Surgery [name], [name] ([id number]) [id number] Page 9 of 9 AV fistula place left upper arm 10/20/22 Family and Social History Cancer: No; Diabetes: Yes - Mother,Siblings; Heart Disease: Yes - Siblings; Hereditary Spherocytosis: No; Hypertension: Yes - Siblings; Kidney Disease: No; Lung Disease: No; Seizures: Yes - Father; Stroke: Yes - Siblings; Thyroid Problems: No; Tuberculosis: No; Former smoker; Marital Status - Married; Alcohol  Use: Rarely; Drug Use: No History; Caffeine Use: Daily; Financial Concerns: No; Food, Clothing or Shelter Needs: No; Support System Lacking: No; Transportation Concerns: No Electronic Signature(s) Signed: 04/02/2023 12:21:19 PM By: [name] Guess MD FACS Entered By: [name] on 04/02/2023 11:56:31 -------------------------------------------------------------------------------- SuperBill Details Patient Name: Date of Service: [facility] [name], [name] 04/02/2023 Medical Record Number: [id number] Patient Account Number: [id number] Date of Birth/Sex: Treating RN: 1951/09/09 (72 y.o. Cline Cools Primary Care Provider: [name] Other Other Clinician: Referring Provider: Treating Provider/Extender: [name] in Treatment: 24 Diagnosis Coding ICD-10 Codes Code Description L97.514 Non-pressure chronic ulcer of other 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  E11.621 Type 2 diabetes mellitus with foot ulcer N18.6 End stage renal disease M86.9 Osteomyelitis, unspecified E11.42 Type 2 diabetes mellitus with diabetic polyneuropathy I10 Essential (primary) hypertension Facility Procedures : CPT4 Code: 84132440 Description: 99213 - WOUND CARE VISIT-LEV 3 EST PT Modifier: Quantity: 1 Physician Procedures : CPT4 Code Description Modifier [id number] 99214 - WC PHYS LEVEL 4 - EST PT ICD-10 Diagnosis Description L97.514 Non-pressure chronic ulcer of other 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 M86.9 Osteomyelitis, unspecified E11.621 Type 2 diabetes mellitus  with foot ulcer N18.6 End stage renal disease Quantity: 1 Electronic Signature(s) Signed: 04/02/2023 12:00:36 PM By: [name] Guess MD FACS Entered By: [name] on 04/02/2023 12:00:36

## 2023-04-02 NOTE — Progress Notes (Signed)
[place], Priest ([id number]) [id number] Page 1 of 9 Visit Report for 04/02/2023 Arrival Information Details Patient Name: Date of Service: [place], [name] 04/02/2023 11:15 A M Medical Record Number: [id number] Patient Account Number: [id number] Date of Birth/Sex: Treating RN: [birth year] (72 y.o. Cline Cools Primary Care Kannen Moxey: [name] Other Other Clinician: Referring Ifrah Vest: Treating Andri Prestia/Extender: [name] Weeks in Treatment: 24 Visit Information History Since Last Visit Added or deleted any medications: No Patient Arrived: [name] Any new allergies or adverse reactions: No Arrival Time: 11:25 Had a fall or experienced change in No Accompanied By: self activities of daily living that may affect Transfer Assistance: None risk of falls: Patient Identification Verified: Yes Signs or symptoms of abuse/neglect since last visito No Secondary Verification Process Completed: Yes Hospitalized since last visit: No Patient Requires Transmission-Based Precautions: No Implantable device outside of the clinic excluding No Patient Has Alerts: No cellular tissue based products placed in the center since last visit: Has Dressing in Place as Prescribed: Yes Pain Present Now: No Electronic Signature(s) Signed: 04/02/2023 3:54:47 PM By: Redmond Pulling RN, BSN Entered By: Redmond Pulling on 04/02/2023 11:26:54 -------------------------------------------------------------------------------- Clinic Level of Care Assessment Details Patient Name: Date of Service: [name], [name] 04/02/2023 11:15 A M Medical Record Number: [id number] Patient Account Number: [id number] Date of Birth/Sex: Treating RN: May 10, 1951 (72 y.o. Cline Cools Primary Care Nadalee Neiswender: [name] Other Other Clinician: Referring Marissah Vandemark: Treating Ibeth Fahmy/Extender: [name] Weeks in Treatment: 24 Clinic Level of Care Assessment Items TOOL 4 Quantity Score X-  1 0 Use when only an EandM is performed on FOLLOW-UP visit ASSESSMENTS - Nursing Assessment / Reassessment X- 1 10 Reassessment of Co-morbidities (includes updates in patient status) X- 1 5 Reassessment of Adherence to Treatment Plan ASSESSMENTS - Wound and Skin A ssessment / Reassessment X - Simple Wound Assessment / Reassessment - one wound 1 5 []  - 0 Complex Wound Assessment / Reassessment - multiple wounds []  - 0 Dermatologic / Skin Assessment (not related to wound area) ASSESSMENTS - Focused Assessment X- 1 5 Circumferential Edema Measurements - multi extremities []  - 0 Nutritional Assessment / Counseling / Intervention Vevay, [name] ([id number]) [id number] Page 2 of 9 []  - 0 Lower Extremity Assessment (monofilament, tuning fork, pulses) []  - 0 Peripheral Arterial Disease Assessment (using hand held doppler) ASSESSMENTS - Ostomy and/or Continence Assessment and Care []  - 0 Incontinence Assessment and Management []  - 0 Ostomy Care Assessment and Management (repouching, etc.) PROCESS - Coordination of Care X - Simple Patient / Family Education for ongoing care 1 15 []  - 0 Complex (extensive) Patient / Family Education for ongoing care X- 1 10 Staff obtains Chiropractor, Records, T Results / Process Orders est []  - 0 Staff telephones HHA, Nursing Homes / Clarify orders / etc []  - 0 Routine Transfer to another Facility (non-emergent condition) []  - 0 Routine Hospital Admission (non-emergent condition) []  - 0 New Admissions / Manufacturing engineer / Ordering NPWT Apligraf, etc. , []  - 0 Emergency Hospital Admission (emergent condition) []  - 0 Simple Discharge Coordination []  - 0 Complex (extensive) Discharge Coordination PROCESS - Special Needs []  - 0 Pediatric / Minor Patient Management []  - 0 Isolation Patient Management []  - 0 Hearing / Language / Visual special needs []  - 0 Assessment of Community assistance (transportation, D/C  planning, etc.) []  - 0 Additional assistance / Altered mentation []  - 0 Support Surface(s) Assessment (bed, cushion, seat, etc.) INTERVENTIONS - Wound Cleansing / Measurement X - Simple Wound Cleansing -  one wound 1 5 []  - 0 Complex Wound Cleansing - multiple wounds X- 1 5 Wound Imaging (photographs - any number of wounds) []  - 0 Wound Tracing (instead of photographs) X- 1 5 Simple Wound Measurement - one wound []  - 0 Complex Wound Measurement - multiple wounds INTERVENTIONS - Wound Dressings X - Small Wound Dressing one or multiple wounds 1 10 []  - 0 Medium Wound Dressing one or multiple wounds []  - 0 Large Wound Dressing one or multiple wounds []  - 0 Application of Medications - topical []  - 0 Application of Medications - injection INTERVENTIONS - Miscellaneous []  - 0 External ear exam []  - 0 Specimen Collection (cultures, biopsies, blood, body fluids, etc.) []  - 0 Specimen(s) / Culture(s) sent or taken to Lab for analysis []  - 0 Patient Transfer (multiple staff / Nurse, adult / Similar devices) []  - 0 Simple Staple / Suture removal (25 or less) []  - 0 Complex Staple / Suture removal (26 or more) []  - 0 Hypo / Hyperglycemic Management (close monitor of Blood Glucose) Feely, Brysan ([id number]) [id number] Page 3 of 9 []  - 0 Ankle / Brachial Index (ABI) - do not check if billed separately X- 1 5 Vital Signs Has the patient been seen at the hospital within the last three years: Yes Total Score: 80 Level Of Care: New/Established - Level 3 Electronic Signature(s) Signed: 04/02/2023 3:54:47 PM By: Redmond Pulling RN, BSN Entered By: Redmond Pulling on 04/02/2023 11:53:04 -------------------------------------------------------------------------------- Encounter Discharge Information Details Patient Name: Date of Service: [facility], [name] 04/02/2023 11:15 A M Medical Record Number: [id number] Patient Account Number: [id number] Date of Birth/Sex:  Treating RN: Feb 27, 1951 (72 y.o. Cline Cools Primary Care Derrell Milanes: [name] Other Other Clinician: Referring Gerrett Loman: Treating Alicia Ackert/Extender: [name] Weeks in Treatment: 24 Encounter Discharge Information Items Discharge Condition: Stable Ambulatory Status: Cane Discharge Destination: Home Transportation: Private Auto Accompanied By: self Schedule Follow-up Appointment: Yes Clinical Summary of Care: Patient Declined Electronic Signature(s) Signed: 04/02/2023 3:54:47 PM By: Redmond Pulling RN, BSN Entered By: Redmond Pulling on 04/02/2023 11:53:52 -------------------------------------------------------------------------------- Lower Extremity Assessment Details Patient Name: Date of Service: [name], [name] 04/02/2023 11:15 A M Medical Record Number: [id number] Patient Account Number: [id number] Date of Birth/Sex: Treating RN: 1951-02-14 (72 y.o. Cline Cools Primary Care Shaqueta Casady: [name] Other Other Clinician: Referring Kalee Mcclenathan: Treating Malyah Ohlrich/Extender: [name] Weeks in Treatment: 24 Edema Assessment Assessed: [Left: No] [Right: No] Edema: [Left: N] [Right: o] Calf Left: Right: Point of Measurement: From Medial Instep 36 cm Ankle Left: Right: Point of Measurement: From Medial Instep 23 cm Vascular Assessment Testerman, [name] ([id number]) [Right:[id number] Page 4 of 9] Pulses: Dorsalis Pedis Palpable: [Right:Yes] Electronic Signature(s) Signed: 04/02/2023 3:54:47 PM By: Redmond Pulling RN, BSN Entered By: Redmond Pulling on 04/02/2023 11:28:16 -------------------------------------------------------------------------------- Multi Wound Chart Details Patient Name: Date of Service: [facility], [name] 04/02/2023 11:15 A M Medical Record Number: [id number] Patient Account Number: [id number] Date of Birth/Sex: Treating RN: 09-May-1951 (72 y.o. M) Primary Care Emalyn Schou: [name] Other Other  Clinician: Referring Benjaman Artman: Treating Stephaniemarie Stoffel/Extender: [name] Weeks in Treatment: 24 Vital Signs Height(in): 72 Pulse(bpm): 86 Weight(lbs): 185 Blood Pressure(mmHg): 151/66 Body Mass Index(BMI): 25.1 Temperature(F): 98.2 Respiratory Rate(breaths/min): 18 [1:Photos:] [N/A:N/A] Right, Medial T Great oe N/A N/A Wound Location: Pressure Injury N/A N/A Wounding Event: Diabetic Wound/Ulcer of the Lower N/A N/A Primary Etiology: Extremity Acute Osteomyelitis N/A N/A Secondary Etiology: Cataracts, Hypertension, Type II N/A N/A Comorbid History: Diabetes, Osteomyelitis, Neuropathy, Confinement Anxiety 08/16/2022  N/A N/A Date Acquired: 81 N/A N/A Weeks of Treatment: Open N/A N/A Wound Status: No N/A N/A Wound Recurrence: 0.2x0.2x0.2 N/A N/A Measurements L x W x D (cm) 0.031 N/A N/A A (cm) : rea 0.006 N/A N/A Volume (cm) : 88.70% N/A N/A % Reduction in A rea: 94.50% N/A N/A % Reduction in Volume: Grade 3 N/A N/A Classification: Small N/A N/A Exudate A mount: Serosanguineous N/A N/A Exudate Type: red, brown N/A N/A Exudate Color: Distinct, outline attached N/A N/A Wound Margin: Large (67-100%) N/A N/A Granulation A mount: Red N/A N/A Granulation Quality: Small (1-33%) N/A N/A Necrotic A mount: Fat Layer (Subcutaneous Tissue): Yes N/A N/A Exposed Structures: Bone: Yes Fascia: No Tendon: No Muscle: No Joint: No Medium (34-66%) N/A N/A Epithelialization: Callus: Yes N/A N/A Periwound Skin Texture: Maceration: Yes N/A N/A Periwound Skin Moisture: No Abnormalities Noted N/A N/A Periwound Skin ColorJanathan [name], [name] ([id number]) [id number] Page 5 of 9 No Abnormality N/A N/A Temperature: Treatment Notes Wound #1 (Toe Great) Wound Laterality: Right, Medial Cleanser Soap and Water Discharge Instruction: May shower and wash wound with dial antibacterial soap and water prior to dressing change. Wound  Cleanser Discharge Instruction: Cleanse the wound with wound cleanser prior to applying a clean dressing using gauze sponges, not tissue or cotton balls. Peri-Wound Care Topical Gentamicin Discharge Instruction: Apply with Mupirocin (Mix Mupirocin and Getamicin ointment) Mupirocin Ointment Discharge Instruction: Apply Mupirocin (Bactroban) with Gentamicinas instructed Primary Dressing Plain packing strip 1/4 (in) Discharge Instruction: Lightly pack as instructed Secondary Dressing Woven Gauze Sponges 2x2 in Discharge Instruction: Apply over primary dressing as directed. Secured With Conforming Stretch Gauze Bandage, Sterile 2x75 (in/in) Discharge Instruction: Secure with stretch gauze as directed. 37M Medipore Soft Cloth Surgical T 2x10 (in/yd) ape Discharge Instruction: Secure with tape as directed. Compression Wrap Compression Stockings Add-Ons Electronic Signature(s) Signed: 04/02/2023 11:54:06 AM By: [name] MD FACS Entered By: [name] on 04/02/2023 11:54:06 -------------------------------------------------------------------------------- Multi-Disciplinary Care Plan Details Patient Name: Date of Service: [facility], [name] 04/02/2023 11:15 A M Medical Record Number: [id number] Patient Account Number: [id number] Date of Birth/Sex: Treating RN: Jan 18, 1951 (72 y.o. Cline Cools Primary Care Dacotah Cabello: [name] Other Other Clinician: Referring Brocha Gilliam: Treating Von Quintanar/Extender: [name] Weeks in Treatment: 24 Multidisciplinary Care Plan reviewed with physician Active Inactive Nutrition Nursing Diagnoses: Impaired glucose control: actual or potential Goals: Patient/caregiver will maintain therapeutic glucose control Martin, [name] ([id number]) [id number] Page 6 of 9 Date Initiated: 10/12/2022 Target Resolution Date: 05/02/2023 Goal Status: Active Interventions: Provide education on elevated blood sugars and  impact on wound healing Provide education on nutrition Treatment Activities: Dietary management education, guidance and counseling : 10/12/2022 Giving encouragement to exercise : 10/12/2022 Notes: Osteomyelitis Nursing Diagnoses: Infection: osteomyelitis Goals: Patient/caregiver will verbalize understanding of disease process and disease management Date Initiated: 10/12/2022 Target Resolution Date: 05/02/2023 Goal Status: Active Interventions: Assess for signs and symptoms of osteomyelitis resolution every visit Provide education on osteomyelitis Treatment Activities: MRI : 08/16/2022 Notes: Electronic Signature(s) Signed: 04/02/2023 3:54:47 PM By: Redmond Pulling RN, BSN Entered By: Redmond Pulling on 04/02/2023 11:32:17 -------------------------------------------------------------------------------- Pain Assessment Details Patient Name: Date of Service: [name], [name] 04/02/2023 11:15 A M Medical Record Number: [id number] Patient Account Number: [id number] Date of Birth/Sex: Treating RN: 1951-02-07 (72 y.o. Cline Cools Primary Care Lahna Nath: [name] Other Other Clinician: Referring Kion Huntsberry: Treating Aldan Camey/Extender: [name] Weeks in Treatment: 24 Active Problems Location of Pain Severity and Description of Pain Patient Has Paino No Site Locations [place], [name] ([id number])  [id number] Page 7 of 9 Pain Management and Medication Current Pain Management: Electronic Signature(s) Signed: 04/02/2023 3:54:47 PM By: Redmond Pulling RN, BSN Entered By: Redmond Pulling on 04/02/2023 11:27:58 -------------------------------------------------------------------------------- Patient/Caregiver Education Details Patient Name: Date of Service: [name], [name] 7/1/2024andnbsp11:15 A M Medical Record Number: [id number] Patient Account Number: [id number] Date of Birth/Gender: Treating RN: June 11, 1951 (72 y.o. Cline Cools Primary Care  Physician: [name] Other Other Clinician: Referring Physician: Treating Physician/Extender: Gracy Racer in Treatment: 24 Education Assessment Education Provided To: Patient Education Topics Provided Wound/Skin Impairment: Methods: Explain/Verbal Responses: State content correctly Electronic Signature(s) Signed: 04/02/2023 3:54:47 PM By: Redmond Pulling RN, BSN Entered By: Redmond Pulling on 04/02/2023 11:33:59 -------------------------------------------------------------------------------- Wound Assessment Details Patient Name: Date of Service: [name], [name] 04/02/2023 11:15 A M Medical Record Number: [id number] Patient Account Number: [id number] Date of Birth/Sex: Treating RN: [birth year] (72 y.o. Cline Cools Primary Care Xayvier Vallez: [name] Other Other Clinician: Referring Randen Kauth: Treating Jake Goodson/Extender: [name] Weeks in Treatment: 24 Wound Status Wound Number: 1 Primary Diabetic Wound/Ulcer of the Lower Extremity Etiology: Wound Location: Right, Medial T Great oe Secondary Acute Osteomyelitis Wounding Event: Pressure Injury Etiology: Date Acquired: 08/16/2022 Wound Open Weeks Of Treatment: 24 Status: Clustered Wound: No Comorbid Cataracts, Hypertension, Type II Diabetes, Osteomyelitis, History: Neuropathy, Confinement Anxiety Photos Powder[place], [name] ([id number]) [id number] Page 8 of 9 Wound Measurements Length: (cm) 0.2 Width: (cm) 0.2 Depth: (cm) 0.2 Area: (cm) 0.031 Volume: (cm) 0.006 % Reduction in Area: 88.7% % Reduction in Volume: 94.5% Epithelialization: Medium (34-66%) Tunneling: No Undermining: No Wound Description Classification: Grade 3 Wound Margin: Distinct, outline attached Exudate Amount: Small Exudate Type: Serosanguineous Exudate Color: red, brown Foul Odor After Cleansing: No Slough/Fibrino Yes Wound Bed Granulation Amount: Large (67-100%) Exposed  Structure Granulation Quality: Red Fascia Exposed: No Necrotic Amount: Small (1-33%) Fat Layer (Subcutaneous Tissue) Exposed: Yes Necrotic Quality: Adherent Slough Tendon Exposed: No Muscle Exposed: No Joint Exposed: No Bone Exposed: Yes Periwound Skin Texture Texture Color No Abnormalities Noted: Yes No Abnormalities Noted: Yes Moisture Temperature / Pain No Abnormalities Noted: Yes Temperature: No Abnormality Treatment Notes Wound #1 (Toe Great) Wound Laterality: Right, Medial Cleanser Soap and Water Discharge Instruction: May shower and wash wound with dial antibacterial soap and water prior to dressing change. Wound Cleanser Discharge Instruction: Cleanse the wound with wound cleanser prior to applying a clean dressing using gauze sponges, not tissue or cotton balls. Peri-Wound Care Topical Gentamicin Discharge Instruction: Apply with Mupirocin (Mix Mupirocin and Getamicin ointment) Mupirocin Ointment Discharge Instruction: Apply Mupirocin (Bactroban) with Gentamicinas instructed Primary Dressing Plain packing strip 1/4 (in) Discharge Instruction: Lightly pack as instructed Secondary Dressing Woven Gauze Sponges 2x2 in Discharge Instruction: Apply over primary dressing as directed. Secured With Conforming Stretch Gauze Bandage, Sterile 2x75 (in/in) Discharge Instruction: Secure with stretch gauze as directed. 57M Medipore Soft Cloth Surgical T 2x10 (in/yd) ape [place], [name] ([id number]) [id number] Page 9 of 9 Discharge Instruction: Secure with tape as directed. Compression Wrap Compression Stockings Add-Ons Electronic Signature(s) Signed: 04/02/2023 3:54:47 PM By: Redmond Pulling RN, BSN Entered By: Redmond Pulling on 04/02/2023 11:31:43 -------------------------------------------------------------------------------- Vitals Details Patient Name: Date of Service: [name], [name] 04/02/2023 11:15 A M Medical Record Number: [id number] Patient  Account Number: [id number] Date of Birth/Sex: Treating RN: 1951/07/29 (72 y.o. Cline Cools Primary Care Raydan Schlabach: [name] Other Other Clinician: Referring Lanny Donoso: Treating Naomii Kreger/Extender: [name] Weeks in Treatment: 24 Vital Signs Time Taken: 11:26 Temperature (F): 98.2 Height (in): 72 Pulse (  bpm): 86 Weight (lbs): 185 Respiratory Rate (breaths/min): 18 Body Mass Index (BMI): 25.1 Blood Pressure (mmHg): 151/66 Reference Range: 80 - 120 mg / dl Electronic Signature(s) Signed: 04/02/2023 3:54:47 PM By: Redmond Pulling RN, BSN Entered By: Redmond Pulling on 04/02/2023 11:27:15

## 2023-04-03 DIAGNOSIS — N2581 Secondary hyperparathyroidism of renal origin: Secondary | ICD-10-CM | POA: Diagnosis not present

## 2023-04-03 DIAGNOSIS — Z992 Dependence on renal dialysis: Secondary | ICD-10-CM | POA: Diagnosis not present

## 2023-04-03 DIAGNOSIS — N186 End stage renal disease: Secondary | ICD-10-CM | POA: Diagnosis not present

## 2023-04-05 DIAGNOSIS — N2581 Secondary hyperparathyroidism of renal origin: Secondary | ICD-10-CM | POA: Diagnosis not present

## 2023-04-05 DIAGNOSIS — N186 End stage renal disease: Secondary | ICD-10-CM | POA: Diagnosis not present

## 2023-04-05 DIAGNOSIS — Z992 Dependence on renal dialysis: Secondary | ICD-10-CM | POA: Diagnosis not present

## 2023-04-07 DIAGNOSIS — Z992 Dependence on renal dialysis: Secondary | ICD-10-CM | POA: Diagnosis not present

## 2023-04-07 DIAGNOSIS — N186 End stage renal disease: Secondary | ICD-10-CM | POA: Diagnosis not present

## 2023-04-07 DIAGNOSIS — N2581 Secondary hyperparathyroidism of renal origin: Secondary | ICD-10-CM | POA: Diagnosis not present

## 2023-04-09 ENCOUNTER — Telehealth: Payer: Self-pay | Admitting: Family Medicine

## 2023-04-09 DIAGNOSIS — E1142 Type 2 diabetes mellitus with diabetic polyneuropathy: Secondary | ICD-10-CM

## 2023-04-09 MED ORDER — FREESTYLE LIBRE 2 SENSOR MISC
3 refills | Status: DC
Start: 2023-04-09 — End: 2023-04-11

## 2023-04-09 NOTE — Telephone Encounter (Signed)
Patient aware.

## 2023-04-09 NOTE — Telephone Encounter (Signed)
Continuous Blood Gluc Sensor (FREESTYLE LIBRE 2 SENSOR) MISC  please send to [facility] [address] patient states his insurance told him it would be cheaper.

## 2023-04-10 ENCOUNTER — Encounter (HOSPITAL_BASED_OUTPATIENT_CLINIC_OR_DEPARTMENT_OTHER): Payer: Medicare PPO | Admitting: General Surgery

## 2023-04-10 DIAGNOSIS — E11621 Type 2 diabetes mellitus with foot ulcer: Secondary | ICD-10-CM | POA: Diagnosis not present

## 2023-04-10 DIAGNOSIS — L97514 Non-pressure chronic ulcer of other 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: Secondary | ICD-10-CM | POA: Diagnosis not present

## 2023-04-10 DIAGNOSIS — E1151 Type 2 diabetes mellitus with diabetic peripheral angiopathy without gangrene: Secondary | ICD-10-CM | POA: Diagnosis not present

## 2023-04-10 DIAGNOSIS — N186 End stage renal disease: Secondary | ICD-10-CM | POA: Diagnosis not present

## 2023-04-10 DIAGNOSIS — E1122 Type 2 diabetes mellitus with diabetic chronic kidney disease: Secondary | ICD-10-CM | POA: Diagnosis not present

## 2023-04-10 DIAGNOSIS — Z992 Dependence on renal dialysis: Secondary | ICD-10-CM | POA: Diagnosis not present

## 2023-04-10 DIAGNOSIS — N2581 Secondary hyperparathyroidism of renal origin: Secondary | ICD-10-CM | POA: Diagnosis not present

## 2023-04-10 DIAGNOSIS — Z833 Family history of diabetes mellitus: Secondary | ICD-10-CM | POA: Diagnosis not present

## 2023-04-10 DIAGNOSIS — I120 Hypertensive chronic kidney disease with stage 5 chronic kidney disease or end stage renal disease: Secondary | ICD-10-CM | POA: Diagnosis not present

## 2023-04-10 DIAGNOSIS — L97512 Non-pressure chronic ulcer of other part of right foot with fat layer exposed: Secondary | ICD-10-CM | POA: Diagnosis not present

## 2023-04-10 NOTE — Progress Notes (Addendum)
[name], [name] ([id number]) [id number] Page 1 of 10 Visit Report for 04/10/2023 Chief Complaint Document Details Patient Name: Date of Service: [name], [name] 04/10/2023 11:45 A M Medical Record Number: [id number] Patient Account Number: [id number] Date of Birth/Sex: Treating RN: 02/11/1951 (72 y.o. M) Primary Care Provider: [name] Other Other Clinician: Referring Provider: Treating Provider/Extender: [name] Weeks in Treatment: 25 Information Obtained from: Patient Chief Complaint Patients presents for treatment of an open diabetic ulcer on the right great toe Electronic Signature(s) Signed: 04/10/2023 12:13:47 PM By: [name] MD FACS Entered By: [name] on 04/10/2023 12:13:47 -------------------------------------------------------------------------------- Debridement Details Patient Name: Date of Service: [name], [name] 04/10/2023 11:45 A M Medical Record Number: [id number] Patient Account Number: [id number] Date of Birth/Sex: Treating RN: [birth year] (72 y.o. [name] Primary Care Provider: [name] Other Other Clinician: Referring Provider: Treating Provider/Extender: [name] Weeks in Treatment: 25 Debridement Performed for Assessment: Wound #1 Right,Medial T Great oe Performed By: Physician [name], MD Debridement Type: Debridement Severity of Tissue Pre Debridement: Fat layer exposed Level of Consciousness (Pre-procedure): Awake and Alert Pre-procedure Verification/Time Out Yes - 11:56 Taken: Start Time: 11:56 Pain Control: Lidocaine 5% topical ointment Percent of Wound Bed Debrided: 100% T Area Debrided (cm): otal 0.03 Tissue and other material debrided: Non-Viable, Slough, Slough Level: Non-Viable Tissue Debridement Description: Selective/Open Wound Instrument: Curette Bleeding: Minimum Hemostasis Achieved: Pressure End Time: 11:57 Procedural Pain: 0 Post Procedural  Pain: 0 Response to Treatment: Procedure was tolerated well Level of Consciousness (Post- Awake and Alert procedure): Post Debridement Measurements of Total Wound Length: (cm) 0.2 Width: (cm) 0.2 Depth: (cm) 0.3 Volume: (cm) 0.009 Character of Wound/Ulcer Post Debridement: Improved [name] ([id number]) [id number] Page 2 of 10 Severity of Tissue Post Debridement: Fat layer exposed Post Procedure Diagnosis Same as Pre-procedure Notes Scribed for Dr. Lady Gary by J.Scotton Electronic Signature(s) Signed: 04/10/2023 12:28:31 PM By: [name] MD FACS Signed: 04/10/2023 5:21:35 PM By: Karie Schwalbe RN Entered By: Karie Schwalbe on 04/10/2023 12:14:19 -------------------------------------------------------------------------------- HPI Details Patient Name: Date of Service: [name], [name], [name] 04/10/2023 11:45 A M Medical Record Number: [id number] Patient Account Number: [id number] Date of Birth/Sex: Treating RN: 1951-09-24 (72 y.o. M) Primary Care Provider: [name] Other Other Clinician: Referring Provider: Treating Provider/Extender: [name] Weeks in Treatment: 25 History of Present Illness HPI Description: ADMISSION 10/12/2022 This is a 72 year old type II diabetic (last hemoglobin A1c 7.7 in October 2023) with stage V chronic kidney disease and hypertension. He has been followed by podiatry for an ulcer on his right great toe. He has a history of prior toe and foot infections requiring amputation. In November 2023, he had an outpatient MRI that demonstrated osteomyelitis. He was admitted to the [name] for IV antibiotics and subsequently underwent irrigation and debridement of the toe with biopsy and placement of married 3 layer matrix. Cultures grew out MRSA and osteomyelitis was seen on pathology. He was followed by infectious disease and given a course of doxycycline and Augmentin. He completed this course at the end of December.  He has been evaluated by vascular surgery with formal ABIs as well as consultation. He does have adequate blood flow at least to the dorsalis pedis. He last saw podiatry on January 4 of this year where he underwent debridement. It was felt he would benefit from specialized wound care and he was referred to our center for further evaluation and management. On the dorsal aspect of his right great toe, there is a  circular ulcer. There is a fair amount of undermining from callus and dry skin. The surface is a bit pale and fibrotic, but there is no obvious necrosis. 10/19/2022: He has been approved by his insurance to undergo hyperbaric oxygen therapy. EKG has been obtained and is normal. We are still waiting on his chest x-ray. The wound is clean with just a little bit of periwound callus and slough on the surface. Tendon is exposed. 10/26/2022: Unfortunately, the required co-pay from his insurance is cost prohibitive for him and he will not be able to receive hyperbaric oxygen therapy. Some moisture got under the dry skin around his wound and lifted it off but there is good epithelialized tissue beneath this. The exposed tendon has deteriorated. 11/03/2022: After some discussion with the financial office, it has become feasible for the patient to undergo hyperbaric oxygen therapy and he has received his first treatments this week. The wound is a little bit smaller and the tissue has a better color to it, but there is still extensive nonviable subcutaneous tissue and slough present. 11/09/2022: The wound measured a little bit smaller today. There is still slough and nonviable subcu tissue and tendon present. 11/16/2022: The wound continues to contract. There is still substantial depth to it, however, and bone remains exposed at the base. This is also a (early) 30-day update for HBO. 11/27/2022: Unfortunately, the wound has deteriorated. It appears that the tissue has become macerated. There is a second ulcer on  the lateral aspect of his great toe. The nail is lifting off of the bed and there is wet skin lifting off of the underlying tissue. The initial wound is smaller but still has a fair amount of depth to it. The patient does report quite a bit more drainage since his last visit. 12/20/2022: For scheduling reasons, somehow we have missed having a wound care visit in between his hyperbaric oxygen therapy treatments. The 2 openings of the wound have converged into 1 large site. There is necrotic tendon present at the lateral aspect of the great toe. The bone is visible, including the joint space. The area of the initial wound is flush with the surrounding skin. No malodor or purulent drainage. 12/28/2022: The wound is vastly improved this week. The 2 areas of the wound have separated due to tissue bridging and healing the space between. There is no more necrotic tissue visible, although the bone/joint space remains exposed. 01/04/2023: His wound continues to improve. The more medial aspect of the toe is nearly closed. Bone remains exposed laterally. He has been approved for Epicord. He is going to start dialysis next week. 01/11/2023: The medial aspect of the wound is closed completely. The lateral open portion of his wound has contracted and is shallower. There is still bone exposure. He has started dialysis. We are going to apply Epicord today. 01/19/2023: There is good granulation tissue starting to fill in his wound. Bone remains exposed, however. 01/26/2023: The wound is smaller again today. The granulation tissue is a bit hypertrophic. There is still an area of bone that remains exposed. 02/02/2023: The wound is about the same size but has better tissue coverage. There is a protruding point of bone present. His insurance has changed and we are WILLFORD, RABIDEAU ([id number]) [id number] Page 3 of 10 needing to secure approval again for his hyperbaric oxygen therapy  treatments. 02/09/2023: The wound is quite a bit smaller today and there is robust granulation tissue on the surface. He has decided to discontinue his  hyperbaric oxygen treatments due to the financial issues associated with traveling to the wound care center on a daily basis, as well as his dialysis sessions. 02/16/2023: The wound continues to contract. There is very nice granulation tissue present and no slough or other debris accumulation. 02/22/2023: The wound is quite a bit smaller today. There is still some depth to it and I can still probe bone. 03/02/2023: The wound has contracted further. There is granulation tissue filling in the base. There is still a narrow passage through which I can probe to bone. 03/09/2023: The wound is down to just a very small hole through which I can still probe to bone. The rest of the wound has epithelialized. 03/16/2023: No real change in the wound dimensions. Bone is still exposed at the base. The edges are rolling inward, suggesting the wound has stalled. 03/26/2023: The wound is about a millimeter in diameter. A skinny probe just barely fits through the opening but I still feel bone at the base. 04/02/2023: The wound is just a tiny bit larger today. Bone is still able to be probed at the base. 04/10/2023: The wound is unchanged. The bone remains exposed. We are awaiting insurance approval for [facility]. Electronic Signature(s) Signed: 04/10/2023 12:14:39 PM By: [name] MD FACS Entered By: [name] on 04/10/2023 12:14:39 -------------------------------------------------------------------------------- Physical Exam Details Patient Name: Date of Service: [name], [name] 04/10/2023 11:45 A M Medical Record Number: [id number] Patient Account Number: [id number] Date of Birth/Sex: Treating RN: [birth year] (72 y.o. M) Primary Care Provider: [name] Other Other Clinician: Referring Provider: Treating Provider/Extender: [name] Weeks in  Treatment: 25 Constitutional . Slightly tachycardic. . . no acute distress. Respiratory Normal work of breathing on room air. Notes 04/10/2023: The wound is unchanged. The bone remains exposed. Electronic Signature(s) Signed: 04/10/2023 12:15:26 PM By: [name] MD FACS Entered By: [name] on 04/10/2023 12:15:26 -------------------------------------------------------------------------------- Physician Orders Details Patient Name: Date of Service: [name], [name] 04/10/2023 11:45 A M Medical Record Number: [id number] Patient Account Number: [id number] Date of Birth/Sex: Treating RN: 06/14/1951 (72 y.o. [name] Primary Care Provider: [name] Other Other Clinician: Referring Provider: Treating Provider/Extender: [name] Weeks in Treatment: 25 Verbal / Phone Orders: No Diagnosis Coding ICD-10 Coding Code Description SAFAL, HALDERMAN ([id number]) [id number] Page 4 of 10 L97.514 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.621 Type 2 diabetes mellitus with foot ulcer N18.6 End stage renal disease M86.9 Osteomyelitis, unspecified E11.42 Type 2 diabetes mellitus with diabetic polyneuropathy I10 Essential (primary) hypertension Follow-up Appointments ppointment in 1 week. - Dr. Lady Gary Rm 3 Wed. July 17th at 11:45am Return A Anesthetic Wound #1 Right,Medial T Great oe (In clinic) Topical Lidocaine 4% applied to wound bed Cellular or Tissue Based Products Cellular or Tissue Based Product Type: - epicord #10-Last 03/16/23 Other Cellular or Tissue Based Products Orders/Instructions: - RUN IVR for [facility] (04/02/23) Bathing/ Shower/ Hygiene May shower and wash wound with soap and water. Off-Loading Wound #1 Right,Medial T Great oe Other: - Diabetic shoe Wound Treatment Wound #1 - T Great oe Wound Laterality: Right, Medial Cleanser: Soap and Water 1 x Per Week/30 Days Discharge  Instructions: May shower and wash wound with dial antibacterial soap and water prior to dressing change. Cleanser: Wound Cleanser 1 x Per Week/30 Days Discharge Instructions: Cleanse the wound with wound cleanser prior to applying a clean dressing using gauze sponges, not tissue or cotton balls. Topical: Gentamicin 1 x Per  Week/30 Days Discharge Instructions: Apply with Mupirocin (Mix Mupirocin and Getamicin ointment) Topical: Mupirocin Ointment 1 x Per Week/30 Days Discharge Instructions: Apply Mupirocin (Bactroban) with Gentamicinas instructed Prim Dressing: Plain packing strip 1/4 (in) 1 x Per Week/30 Days ary Discharge Instructions: Lightly pack as instructed Secondary Dressing: Woven Gauze Sponges 2x2 in 1 x Per Week/30 Days Discharge Instructions: Apply over primary dressing as directed. Secured With: Insurance underwriter, Sterile 2x75 (in/in) 1 x Per Week/30 Days Discharge Instructions: Secure with stretch gauze as directed. Secured With: 62M Medipore Scientist, research (life sciences) Surgical T 2x10 (in/yd) 1 x Per Week/30 Days ape Discharge Instructions: Secure with tape as directed. Electronic Signature(s) Signed: 04/10/2023 12:28:31 PM By: [name] MD FACS Signed: 04/10/2023 5:21:35 PM By: Karie Schwalbe RN Previous Signature: 04/10/2023 12:15:58 PM Version By: [name] MD FACS Entered By: Karie Schwalbe on 04/10/2023 12:16:59 -------------------------------------------------------------------------------- Problem List Details Patient Name: Date of Service: [name], [name] 04/10/2023 11:45 A M Medical Record Number: [id number] Patient Account Number: [id number] Date of Birth/Sex: Treating RN: [birth year] (72 y.o. M) Primary Care Provider: [name] Other Other Clinician: Referring Provider: Treating Provider/Extender: [name] [place], [name] ([id number]) [id number] Page 5 of 10 Weeks in Treatment: 25 Active  Problems ICD-10 Encounter Code Description Active Date MDM Diagnosis L97.514 Non-pressure chronic ulcer of other 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 10/12/2022 No Yes E11.621 Type 2 diabetes mellitus with foot ulcer 10/12/2022 No Yes N18.6 End stage renal disease 10/12/2022 No Yes M86.9 Osteomyelitis, unspecified 10/12/2022 No Yes E11.42 Type 2 diabetes mellitus with diabetic polyneuropathy 10/12/2022 No Yes I10 Essential (primary) hypertension 10/12/2022 No Yes Inactive Problems Resolved Problems Electronic Signature(s) Signed: 04/10/2023 12:08:09 PM By: [name] MD FACS Entered By: [name] on 04/10/2023 12:08:08 -------------------------------------------------------------------------------- Progress Note Details Patient Name: Date of Service: [name], [name] 04/10/2023 11:45 A M Medical Record Number: [id number] Patient Account Number: [id number] Date of Birth/Sex: Treating RN: [birth year] (72 y.o. M) Primary Care Provider: [name] Other Other Clinician: Referring Provider: Treating Provider/Extender: [name] Weeks in Treatment: 25 Subjective Chief Complaint Information obtained from Patient Patients presents for treatment of an open diabetic ulcer on the right great toe History of Present Illness (HPI) ADMISSION 10/12/2022 This is a 72 year old type II diabetic (last hemoglobin A1c 7.7 in October 2023) with stage V chronic kidney disease and hypertension. He has been followed by podiatry for an ulcer on his right great toe. He has a history of prior toe and foot infections requiring amputation. In November 2023, he had an outpatient MRI that demonstrated osteomyelitis. He was admitted to the [name] for IV antibiotics and subsequently underwent irrigation and debridement of the toe with biopsy and placement of married 3 layer matrix. Cultures grew out MRSA and osteomyelitis was seen on pathology. He was followed by infectious disease and given a  course of doxycycline and Augmentin. He completed this course at the end of December. He has been evaluated by vascular surgery with formal ABIs as well as consultation. He does have adequate blood flow at least to the dorsalis pedis. He last saw podiatry on January 4 of this year where he underwent debridement. It was felt he would benefit from specialized wound care and he was referred to our center for further evaluation and management. On the dorsal aspect of his right great toe, there is a circular ulcer. There is a fair amount of undermining from callus and dry skin. The surface is a bit pale Recinos, Endre (  [id number]) [id number] Page 6 of 10 and fibrotic, but there is no obvious necrosis. 10/19/2022: He has been approved by his insurance to undergo hyperbaric oxygen therapy. EKG has been obtained and is normal. We are still waiting on his chest x-ray. The wound is clean with just a little bit of periwound callus and slough on the surface. Tendon is exposed. 10/26/2022: Unfortunately, the required co-pay from his insurance is cost prohibitive for him and he will not be able to receive hyperbaric oxygen therapy. Some moisture got under the dry skin around his wound and lifted it off but there is good epithelialized tissue beneath this. The exposed tendon has deteriorated. 11/03/2022: After some discussion with the financial office, it has become feasible for the patient to undergo hyperbaric oxygen therapy and he has received his first treatments this week. The wound is a little bit smaller and the tissue has a better color to it, but there is still extensive nonviable subcutaneous tissue and slough present. 11/09/2022: The wound measured a little bit smaller today. There is still slough and nonviable subcu tissue and tendon present. 11/16/2022: The wound continues to contract. There is still substantial depth to it, however, and bone remains exposed at the base. This is also  a (early) 30-day update for HBO. 11/27/2022: Unfortunately, the wound has deteriorated. It appears that the tissue has become macerated. There is a second ulcer on the lateral aspect of his great toe. The nail is lifting off of the bed and there is wet skin lifting off of the underlying tissue. The initial wound is smaller but still has a fair amount of depth to it. The patient does report quite a bit more drainage since his last visit. 12/20/2022: For scheduling reasons, somehow we have missed having a wound care visit in between his hyperbaric oxygen therapy treatments. The 2 openings of the wound have converged into 1 large site. There is necrotic tendon present at the lateral aspect of the great toe. The bone is visible, including the joint space. The area of the initial wound is flush with the surrounding skin. No malodor or purulent drainage. 12/28/2022: The wound is vastly improved this week. The 2 areas of the wound have separated due to tissue bridging and healing the space between. There is no more necrotic tissue visible, although the bone/joint space remains exposed. 01/04/2023: His wound continues to improve. The more medial aspect of the toe is nearly closed. Bone remains exposed laterally. He has been approved for Epicord. He is going to start dialysis next week. 01/11/2023: The medial aspect of the wound is closed completely. The lateral open portion of his wound has contracted and is shallower. There is still bone exposure. He has started dialysis. We are going to apply Epicord today. 01/19/2023: There is good granulation tissue starting to fill in his wound. Bone remains exposed, however. 01/26/2023: The wound is smaller again today. The granulation tissue is a bit hypertrophic. There is still an area of bone that remains exposed. 02/02/2023: The wound is about the same size but has better tissue coverage. There is a protruding point of bone present. His insurance has changed and we  are needing to secure approval again for his hyperbaric oxygen therapy treatments. 02/09/2023: The wound is quite a bit smaller today and there is robust granulation tissue on the surface. He has decided to discontinue his hyperbaric oxygen treatments due to the financial issues associated with traveling to the wound care center on a daily basis, as well  as his dialysis sessions. 02/16/2023: The wound continues to contract. There is very nice granulation tissue present and no slough or other debris accumulation. 02/22/2023: The wound is quite a bit smaller today. There is still some depth to it and I can still probe bone. 03/02/2023: The wound has contracted further. There is granulation tissue filling in the base. There is still a narrow passage through which I can probe to bone. 03/09/2023: The wound is down to just a very small hole through which I can still probe to bone. The rest of the wound has epithelialized. 03/16/2023: No real change in the wound dimensions. Bone is still exposed at the base. The edges are rolling inward, suggesting the wound has stalled. 03/26/2023: The wound is about a millimeter in diameter. A skinny probe just barely fits through the opening but I still feel bone at the base. 04/02/2023: The wound is just a tiny bit larger today. Bone is still able to be probed at the base. 04/10/2023: The wound is unchanged. The bone remains exposed. We are awaiting insurance approval for [facility]. Patient History Information obtained from Patient. Family History Diabetes - Mother,Siblings, Heart Disease - Siblings, Hypertension - Siblings, Seizures - Father, Stroke - Siblings, No family history of Cancer, Hereditary Spherocytosis, Kidney Disease, Lung Disease, Thyroid Problems, Tuberculosis. Social History Former smoker, Marital Status - Married, Alcohol Use - Rarely, Drug Use - No History, Caffeine Use - Daily. Medical History Eyes Patient has history of Cataracts - rt  eye Ear/Nose/Mouth/Throat Denies history of Chronic sinus problems/congestion Hematologic/Lymphatic Denies history of Anemia, Hemophilia, Human Immunodeficiency Virus, Lymphedema, Sickle Cell Disease Respiratory Denies history of Aspiration, Asthma, Chronic Obstructive Pulmonary Disease (COPD), Pneumothorax, Tuberculosis Cardiovascular Patient has history of Hypertension Gastrointestinal Denies history of Cirrhosis , Colitis, Crohns, Hepatitis A, Hepatitis B, Hepatitis C Endocrine Patient has history of Type II Diabetes Genitourinary Denies history of End Stage Renal Disease Immunological Denies history of Lupus Erythematosus, Raynauds, Scleroderma Integumentary (Skin) Denies history of History of Burn [place], Mack ([id number]) [phone].pdf Page 7 of 10 Musculoskeletal Patient has history of Osteomyelitis - MRI confirmed (08/16/22) Denies history of Gout Neurologic Patient has history of Neuropathy Psychiatric Patient has history of Confinement Anxiety Denies history of Anorexia/bulimia Hospitalization/Surgery History - AV fistula place left upper arm 10/20/22. Medical A Surgical History Notes nd Genitourinary CKD IV Objective Constitutional Slightly tachycardic. no acute distress. Vitals Time Taken: 11:46 AM, Height: 72 in, Weight: 185 lbs, BMI: 25.1, Temperature: 97.9 F, Pulse: 102 bpm, Respiratory Rate: 18 breaths/min, Blood Pressure: 114/64 mmHg. Respiratory Normal work of breathing on room air. General Notes: 04/10/2023: The wound is unchanged. The bone remains exposed. Integumentary (Hair, Skin) Wound #1 status is Open. Original cause of wound was Pressure Injury. The date acquired was: 08/16/2022. The wound has been in treatment 25 weeks. The wound is located on the Right,Medial T Great. The wound measures 0.2cm length x 0.2cm width x 0.3cm depth; 0.031cm^2 area and 0.009cm^3 volume. oe There is bone and Fat Layer (Subcutaneous Tissue)  exposed. There is no tunneling or undermining noted. There is a small amount of serosanguineous drainage noted. The wound margin is distinct with the outline attached to the wound base. There is small (1-33%) red granulation within the wound bed. There is a large (67-100%) amount of necrotic tissue within the wound bed including Adherent Slough. The periwound skin appearance had no abnormalities noted for texture. The periwound skin appearance had no abnormalities noted for moisture. The periwound skin appearance had no abnormalities noted  for color. Periwound temperature was noted as No Abnormality. 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 There was a Selective/Open Wound Non-Viable Tissue Debridement with a total area of 0.03 sq cm performed by [name], MD. With the following instrument(s): Curette to remove Non-Viable tissue/material. Material removed includes [name] after achieving pain control using Lidocaine 5% topical ointment. No specimens were taken. A time out was conducted at 11:56, prior to the start of the procedure. A Minimum amount of bleeding was controlled with Pressure. The procedure was tolerated well with a pain level of 0 throughout and a pain level of 0 following the procedure. Post Debridement Measurements: 0.2cm length x 0.2cm width x 0.3cm depth; 0.009cm^3 volume. Character of Wound/Ulcer Post Debridement is improved. Severity of Tissue Post Debridement is: Fat layer exposed. Post procedure Diagnosis Wound #1: Same  as Pre-Procedure General Notes: Scribed for Dr. Lady Gary by J.Scotton. Plan Trinity, [name] ([id number]) [id number] Page 8 of 10 04/10/2023: The wound is unchanged. The bone remains exposed. I used a curette to debride slough from the wound. We will continue to pack the wound with a mixture of topical gentamicin and mupirocin with quarter inch plain gauze packing strips. We will work on Therapist, occupational for the [employer] substitute. He will follow-up in 1 week. Electronic Signature(s) Signed: 04/10/2023 12:16:54 PM By: [name] MD FACS Entered By: [name] on 04/10/2023 12:16:54 -------------------------------------------------------------------------------- HxROS Details Patient Name: Date of Service: [name] [name], [name] 04/10/2023 11:45 A M Medical Record Number: [id number] Patient Account Number: [id number] Date of Birth/Sex: Treating RN: [birth year] (72 y.o. M) Primary Care Provider: [name] Other Other Clinician: Referring Provider: Treating Provider/Extender: [name] Weeks in Treatment: 25 Information Obtained From Patient Eyes Medical History: Positive for: Cataracts - rt eye Ear/Nose/Mouth/Throat Medical History: Negative for: Chronic sinus problems/congestion Hematologic/Lymphatic Medical History: Negative for: Anemia; Hemophilia; Human Immunodeficiency Virus; Lymphedema; Sickle Cell Disease Respiratory Medical History: Negative for: Aspiration; Asthma; Chronic Obstructive Pulmonary Disease (COPD); Pneumothorax; Tuberculosis Cardiovascular Medical History: Positive for: Hypertension Gastrointestinal Medical History: Negative for: Cirrhosis ; Colitis; Crohns; Hepatitis A; Hepatitis B; Hepatitis C Endocrine Medical History: Positive for: Type II Diabetes Time with diabetes: 2000 Treated with: Insulin Blood sugar tested every day: Yes Tested : Genitourinary Medical History: Negative for: End Stage Renal  Disease Past Medical History Notes: CKD IV Tibbitts, Pacen ([id number]) [id number] Page 9 of 10 Immunological Medical History: Negative for: Lupus Erythematosus; Raynauds; Scleroderma Integumentary (Skin) Medical History: Negative for: History of Burn Musculoskeletal Medical History: Positive for: Osteomyelitis - MRI confirmed (08/16/22) Negative for: Gout Neurologic Medical History: Positive for: Neuropathy Psychiatric Medical History: Positive for: Confinement Anxiety Negative for: Anorexia/bulimia HBO Extended History Items Eyes: Cataracts Immunizations Pneumococcal Vaccine: Received Pneumococcal Vaccination: Yes Received Pneumococcal Vaccination On or After 60th Birthday: Yes Implantable Devices None Hospitalization / Surgery History Type of Hospitalization/Surgery AV fistula place left upper arm 10/20/22 Family and Social History Cancer: No; Diabetes: Yes - Mother,Siblings; Heart Disease: Yes - Siblings; Hereditary Spherocytosis: No; Hypertension: Yes - Siblings; Kidney Disease: No; Lung Disease: No; Seizures: Yes - Father; Stroke: Yes - Siblings; Thyroid Problems: No; Tuberculosis: No; Former  smoker; Marital Status - Married; Alcohol Use: Rarely; Drug Use: No History; Caffeine Use: Daily; Financial Concerns: No; Food, Clothing or Shelter Needs: No; Support System Lacking: No; Transportation Concerns: No Electronic Signature(s) Signed: 04/10/2023 12:28:31 PM By: [name] MD FACS Entered By: [name] on 04/10/2023 12:14:49 -------------------------------------------------------------------------------- SuperBill Details Patient Name: Date of Service: [facility], [name] 04/10/2023 Medical Record Number: [id number] Patient Account Number: [id number] Date of Birth/Sex: Treating RN: [birth year] (72 y.o. 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 Essential (primary) hypertension Facility Procedures : CPT4 Code: 78295621 Description: 30865 - DEBRIDE WOUND 1ST 20 SQ CM OR < ICD-10 Diagnosis Description L97.514 Non-pressure chronic ulcer of other 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 Modifier: Quantity: 1 Physician Procedures : CPT4 Code Description Modifier [id number] 99214 - WC PHYS LEVEL 4 - EST PT 25 ICD-10 Diagnosis Description L97.514 Non-pressure chronic ulcer of other 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 M86.9 Osteomyelitis, unspecified E11.621 Type 2 diabetes  mellitus with foot ulcer N18.6 End stage renal disease Quantity: 1 : [id number] 97597 - WC PHYS DEBR WO ANESTH 20 SQ CM ICD-10 Diagnosis Description L97.514 Non-pressure chronic ulcer of other part of right foot with necrosis of bone Quantity: 1 Electronic Signature(s) Signed: 04/10/2023 12:28:09 PM By: [name] MD FACS Entered By: [name] on 04/10/2023 12:28:08

## 2023-04-10 NOTE — Progress Notes (Signed)
[name], [name] ([id number]) [id number] Page 1 of 7 Visit Report for 04/10/2023 Arrival Information Details Patient Name: Date of Service: [place], [name] 04/10/2023 11:45 A M Medical Record Number: [id number] Patient Account Number: [id number] Date of Birth/Sex: Treating RN: 08-28-1951 (72 y.o. M) Primary Care Tomasita Beevers: [name] Other Other Clinician: Referring Tiyonna Sardinha: Treating Kimoni Pagliarulo/Extender: [name] Weeks in Treatment: 25 Visit Information History Since Last Visit All ordered tests and consults were completed: No Patient Arrived: Ambulatory Added or deleted any medications: No Arrival Time: 11:46 Any new allergies or adverse reactions: No Accompanied By: self Had a fall or experienced change in No Transfer Assistance: None activities of daily living that may affect Patient Identification Verified: Yes risk of falls: Secondary Verification Process Completed: Yes Signs or symptoms of abuse/neglect since last visito No Patient Requires Transmission-Based Precautions: No Hospitalized since last visit: No Patient Has Alerts: No Implantable device outside of the clinic excluding No cellular tissue based products placed in the [name] since last visit: Pain Present Now: No Electronic Signature(s) Signed: 04/10/2023 3:48:39 PM By: [name] Scrape Entered By: [name] Scrape on 04/10/2023 11:46:40 -------------------------------------------------------------------------------- Encounter Discharge Information Details Patient Name: Date of Service: [name], [name] 04/10/2023 11:45 A M Medical Record Number: [id number] Patient Account Number: [id number] Date of Birth/Sex: Treating RN: 23-Dec-1950 (72 y.o. [name] Primary Care Stanford Strauch: [name] Other Other Clinician: Referring Shirlean Berman: Treating Jakyron Fabro/Extender: [name] Weeks in Treatment: 25 Encounter Discharge Information Items Post Procedure Vitals Discharge  Condition: Stable Temperature (F): 97.9 Ambulatory Status: Ambulatory Pulse (bpm): 102 Discharge Destination: Home Respiratory Rate (breaths/min): 18 Transportation: Private Auto Blood Pressure (mmHg): 114/64 Accompanied By: self Schedule Follow-up Appointment: Yes Clinical Summary of Care: Patient Declined Electronic Signature(s) Signed: 04/10/2023 5:21:35 PM By: Karie Schwalbe RN Entered By: Karie Schwalbe on 04/10/2023 17:21:11 [name], [name] ([id number]) [id number] Page 2 of 7 -------------------------------------------------------------------------------- Lower Extremity Assessment Details Patient Name: Date of Service[place], [name] 04/10/2023 11:45 A M Medical Record Number: [id number] Patient Account Number: [id number] Date of Birth/Sex: Treating RN: [birth year] (72 y.o. [name] Primary Care Kileigh Ortmann: [name] Other Other Clinician: Referring Brendolyn Stockley: Treating Daiveon Markman/Extender: [name] Weeks in Treatment: 25 Edema Assessment Assessed: [Left: No] [Right: No] Edema: [Left: N] [Right: o] Calf Left: Right: Point of Measurement: From Medial Instep 36 cm Ankle Left: Right: Point of Measurement: From Medial Instep 23 cm Vascular Assessment Pulses: Dorsalis Pedis Palpable: [Right:Yes] Electronic Signature(s) Signed: 04/10/2023 5:21:35 PM By: Karie Schwalbe RN Entered By: Karie Schwalbe on 04/10/2023 11:55:09 -------------------------------------------------------------------------------- Multi Wound Chart Details Patient Name: Date of Service: [facility] [name], [name] 04/10/2023 11:45 A M Medical Record Number: [id number] Patient Account Number: [id number] Date of Birth/Sex: Treating RN: 03-28-1951 (72 y.o. M) Primary Care Cope Marte: [name] Other Other Clinician: Referring Hanne Kegg: Treating Roth Ress/Extender: [name] Weeks in Treatment: 25 Vital Signs Height(in): 72 Pulse(bpm): 102 Weight(lbs):  185 Blood Pressure(mmHg): 114/64 Body Mass Index(BMI): 25.1 Temperature(F): 97.9 Respiratory Rate(breaths/min): 18 [1:Photos:] [N/A:N/A] Right, Medial T Great oe N/A N/A Wound Location: Pressure Injury N/A N/A Wounding Event: Diabetic Wound/Ulcer of the Lower N/A N/A Primary Etiology: Extremity Acute Osteomyelitis N/A N/A Secondary Etiology: Cataracts, Hypertension, Type II N/A N/A Comorbid History: Diabetes, Osteomyelitis, Neuropathy, Confinement Anxiety 08/16/2022 N/A N/A Date Acquired: 25 N/A N/A Weeks of Treatment: Open N/A N/A Wound Status: No N/A N/A Wound Recurrence: 0.2x0.2x0.3 N/A N/A Measurements L x W x D (cm) 0.031 N/A N/A A (cm) : rea 0.009 N/A N/A Volume (cm) : 88.70%  N/A N/A % Reduction in A rea: 91.80% N/A N/A % Reduction in Volume: Grade 3 N/A N/A Classification: Small N/A N/A Exudate A mount: Serosanguineous N/A N/A Exudate Type: red, brown N/A N/A Exudate Color: Distinct, outline attached N/A N/A Wound Margin: Small (1-33%) N/A N/A Granulation A mount: Red N/A N/A Granulation Quality: Large (67-100%) N/A N/A Necrotic A mount: Fat Layer (Subcutaneous Tissue): Yes N/A N/A Exposed Structures: Bone: Yes Fascia: No Tendon: No Muscle: No Joint: No Medium (34-66%) N/A N/A Epithelialization: Callus: Yes N/A N/A Periwound Skin Texture: Maceration: Yes N/A N/A Periwound Skin Moisture: No Abnormalities Noted N/A N/A Periwound Skin Color: No Abnormality N/A N/A Temperature: Treatment Notes Electronic Signature(s) Signed: 04/10/2023 12:12:35 PM By: [name] MD FACS Entered By: [name] on 04/10/2023 12:12:35 -------------------------------------------------------------------------------- Multi-Disciplinary Care Plan Details Patient Name: Date of Service: [name], [name] 04/10/2023 11:45 A M Medical Record Number: [id number] Patient Account Number: [id number] Date of Birth/Sex: Treating RN: 1951-10-02 (72 y.o. [name] Primary Care Marthe Dant: [name] Other Other Clinician: Referring Mckade Gurka: Treating Briyonna Omara/Extender: [name] Weeks in Treatment: 25 Multidisciplinary Care Plan reviewed with physician Active Inactive Nutrition Nursing Diagnoses: Impaired glucose control: actual or potential Goals: Patient/caregiver will maintain therapeutic glucose control Date Initiated: 10/12/2022 Target Resolution Date: 06/02/2023 Goal Status: Active Interventions: Provide education on elevated blood sugars and impact on wound healing Provide education on nutrition [name], [name] ([id number]) [id number] Page 4 of 7 Treatment Activities: Dietary management education, guidance and counseling : 10/12/2022 Giving encouragement to exercise : 10/12/2022 Notes: Osteomyelitis Nursing Diagnoses: Infection: osteomyelitis Goals: Patient/caregiver will verbalize understanding of disease process and disease management Date Initiated: 10/12/2022 Target Resolution Date: 06/02/2023 Goal Status: Active Interventions: Assess for signs and symptoms of osteomyelitis resolution every visit Provide education on osteomyelitis Treatment Activities: MRI : 08/16/2022 Notes: Electronic Signature(s) Signed: 04/10/2023 5:21:35 PM By: Karie Schwalbe RN Entered By: Karie Schwalbe on 04/10/2023 17:18:32 -------------------------------------------------------------------------------- Pain Assessment Details Patient Name: Date of Service: [name] [name] 04/10/2023 11:45 A M Medical Record Number: [id number] Patient Account Number: [id number] Date of Birth/Sex: Treating RN: [birth year] (72 y.o. M) Primary Care Shelbe Haglund: [name] Other Other Clinician: Referring Editha Bridgeforth: Treating Markise Haymer/Extender: [name] Weeks in Treatment: 25 Active Problems Location of Pain Severity and Description of Pain Patient Has Paino No Site Locations Pain Management and  Medication Current Pain Management: Electronic Signature(s) [name], [name] ([id number]) [id number] Page 5 of 7 Signed: 04/10/2023 3:48:39 PM By: [name] Scrape Entered By: [name] Scrape on 04/10/2023 11:48:13 -------------------------------------------------------------------------------- Patient/Caregiver Education Details Patient Name: Date of Service: [name], [name] 7/9/2024andnbsp11:45 A M Medical Record Number: [id number] Patient Account Number: [id number] Date of Birth/Gender: Treating RN: 1950-10-04 (72 y.o. [name] Primary Care Physician: [name] Other Other Clinician: Referring Physician: Treating Physician/Extender: Gracy Racer in Treatment: 25 Education Assessment Education Provided To: Patient Education Topics Provided Wound/Skin Impairment: Methods: Explain/Verbal Responses: State content correctly Electronic Signature(s) Signed: 04/10/2023 5:21:35 PM By: Karie Schwalbe RN Entered By: Karie Schwalbe on 04/10/2023 17:18:49 -------------------------------------------------------------------------------- Wound Assessment Details Patient Name: Date of Service: [name], [name] 04/10/2023 11:45 A M Medical Record Number: [id number] Patient Account Number: [id number] Date of Birth/Sex: Treating RN: 02-17-1951 (72 y.o. M) Primary Care Palyn Scrima: [name] Other Other Clinician: Referring Edelyn Heidel: Treating Karina Nofsinger/Extender: [name] Weeks in Treatment: 25 Wound Status Wound Number: 1 Primary Diabetic Wound/Ulcer of the Lower Extremity Etiology: Wound Location: Right, Medial T Great oe Secondary Acute Osteomyelitis Wounding Event: Pressure Injury Etiology: Date  Acquired: 08/16/2022 Wound Open Weeks Of Treatment: 25 Status: Clustered Wound: No Comorbid Cataracts, Hypertension, Type II Diabetes, Osteomyelitis, History: Neuropathy, Confinement Anxiety Photos [address], [name] ([id number])  [id number] Page 6 of 7 Wound Measurements Length: (cm) 0.2 Width: (cm) 0.2 Depth: (cm) 0.3 Area: (cm) 0.031 Volume: (cm) 0.009 % Reduction in Area: 88.7% % Reduction in Volume: 91.8% Epithelialization: Medium (34-66%) Tunneling: No Undermining: No Wound Description Classification: Grade 3 Wound Margin: Distinct, outline attached Exudate Amount: Small Exudate Type: Serosanguineous Exudate Color: red, brown Foul Odor After Cleansing: No Slough/Fibrino Yes Wound Bed Granulation Amount: Small (1-33%) Exposed Structure Granulation Quality: Red Fascia Exposed: No Necrotic Amount: Large (67-100%) Fat Layer (Subcutaneous Tissue) Exposed: Yes Necrotic Quality: Adherent Slough Tendon Exposed: No Muscle Exposed: No Joint Exposed: No Bone Exposed: Yes Periwound Skin Texture Texture Color No Abnormalities Noted: Yes No Abnormalities Noted: Yes Moisture Temperature / Pain No Abnormalities Noted: Yes Temperature: No Abnormality Treatment Notes Wound #1 (Toe Great) Wound Laterality: Right, Medial Cleanser Soap and Water Discharge Instruction: May shower and wash wound with dial antibacterial soap and water prior to dressing change. Wound Cleanser Discharge Instruction: Cleanse the wound with wound cleanser prior to applying a clean dressing using gauze sponges, not tissue or cotton balls. Peri-Wound Care Topical Gentamicin Discharge Instruction: Apply with Mupirocin (Mix Mupirocin and Getamicin ointment) Mupirocin Ointment Discharge Instruction: Apply Mupirocin (Bactroban) with Gentamicinas instructed Primary Dressing Plain packing strip 1/4 (in) Discharge Instruction: Lightly pack as instructed Secondary Dressing Woven Gauze Sponges 2x2 in Discharge Instruction: Apply over primary dressing as directed. Secured With Conforming Stretch Gauze Bandage, Sterile 2x75 (in/in) Discharge Instruction: Secure with stretch gauze as directed. 60M Medipore  Soft Cloth Surgical T 2x10 (in/yd) ape Cantrelle, [place] ([id number]) [id number] Page 7 of 7 Discharge Instruction: Secure with tape as directed. Compression Wrap Compression Stockings Add-Ons Electronic Signature(s) Signed: 04/10/2023 5:21:35 PM By: Karie Schwalbe RN Entered By: Karie Schwalbe on 04/10/2023 11:55:32 -------------------------------------------------------------------------------- Vitals Details Patient Name: Date of Service: [name], [name] 04/10/2023 11:45 A M Medical Record Number: [id number] Patient Account Number: [id number] Date of Birth/Sex: Treating RN: [birth year] (72 y.o. M) Primary Care Djuan Talton: [name] Other Other Clinician: Referring Jaslene Marsteller: Treating Felesia Stahlecker/Extender: [name] Weeks in Treatment: 25 Vital Signs Time Taken: 11:46 Temperature (F): 97.9 Height (in): 72 Pulse (bpm): 102 Weight (lbs): 185 Respiratory Rate (breaths/min): 18 Body Mass Index (BMI): 25.1 Blood Pressure (mmHg): 114/64 Reference Range: 80 - 120 mg / dl Electronic Signature(s) Signed: 04/10/2023 3:48:39 PM By: [name] Scrape Entered By: [name] Scrape on 04/10/2023 11:48:06

## 2023-04-11 ENCOUNTER — Other Ambulatory Visit: Payer: Self-pay | Admitting: *Deleted

## 2023-04-11 DIAGNOSIS — E1142 Type 2 diabetes mellitus with diabetic polyneuropathy: Secondary | ICD-10-CM

## 2023-04-11 MED ORDER — FREESTYLE LIBRE 2 SENSOR MISC
3 refills | Status: DC
Start: 2023-04-11 — End: 2023-10-19

## 2023-04-11 NOTE — Telephone Encounter (Signed)
Prescription sent electronically to pharmacy. 

## 2023-04-12 DIAGNOSIS — Z992 Dependence on renal dialysis: Secondary | ICD-10-CM | POA: Diagnosis not present

## 2023-04-12 DIAGNOSIS — N2581 Secondary hyperparathyroidism of renal origin: Secondary | ICD-10-CM | POA: Diagnosis not present

## 2023-04-12 DIAGNOSIS — N186 End stage renal disease: Secondary | ICD-10-CM | POA: Diagnosis not present

## 2023-04-14 DIAGNOSIS — N186 End stage renal disease: Secondary | ICD-10-CM | POA: Diagnosis not present

## 2023-04-14 DIAGNOSIS — N2581 Secondary hyperparathyroidism of renal origin: Secondary | ICD-10-CM | POA: Diagnosis not present

## 2023-04-14 DIAGNOSIS — Z992 Dependence on renal dialysis: Secondary | ICD-10-CM | POA: Diagnosis not present

## 2023-04-17 DIAGNOSIS — Z992 Dependence on renal dialysis: Secondary | ICD-10-CM | POA: Diagnosis not present

## 2023-04-17 DIAGNOSIS — N2581 Secondary hyperparathyroidism of renal origin: Secondary | ICD-10-CM | POA: Diagnosis not present

## 2023-04-17 DIAGNOSIS — N186 End stage renal disease: Secondary | ICD-10-CM | POA: Diagnosis not present

## 2023-04-18 ENCOUNTER — Encounter (HOSPITAL_BASED_OUTPATIENT_CLINIC_OR_DEPARTMENT_OTHER): Payer: Medicare PPO | Admitting: General Surgery

## 2023-04-18 DIAGNOSIS — L97512 Non-pressure chronic ulcer of other part of right foot with fat layer exposed: Secondary | ICD-10-CM | POA: Diagnosis not present

## 2023-04-18 DIAGNOSIS — N186 End stage renal disease: Secondary | ICD-10-CM | POA: Diagnosis not present

## 2023-04-18 DIAGNOSIS — E1122 Type 2 diabetes mellitus with diabetic chronic kidney disease: Secondary | ICD-10-CM | POA: Diagnosis not present

## 2023-04-18 DIAGNOSIS — L97514 Non-pressure chronic ulcer of other part of right foot with necrosis of bone: Secondary | ICD-10-CM | POA: Diagnosis not present

## 2023-04-18 DIAGNOSIS — Z833 Family history of diabetes mellitus: Secondary | ICD-10-CM | POA: Diagnosis not present

## 2023-04-18 DIAGNOSIS — E1151 Type 2 diabetes mellitus with diabetic peripheral angiopathy without gangrene: Secondary | ICD-10-CM | POA: Diagnosis not present

## 2023-04-18 DIAGNOSIS — I120 Hypertensive chronic kidney disease with stage 5 chronic kidney disease or end stage renal disease: Secondary | ICD-10-CM | POA: Diagnosis not present

## 2023-04-18 DIAGNOSIS — E11621 Type 2 diabetes mellitus with foot ulcer: Secondary | ICD-10-CM | POA: Diagnosis not present

## 2023-04-19 DIAGNOSIS — Z992 Dependence on renal dialysis: Secondary | ICD-10-CM | POA: Diagnosis not present

## 2023-04-19 DIAGNOSIS — N2581 Secondary hyperparathyroidism of renal origin: Secondary | ICD-10-CM | POA: Diagnosis not present

## 2023-04-19 DIAGNOSIS — N186 End stage renal disease: Secondary | ICD-10-CM | POA: Diagnosis not present

## 2023-04-24 DIAGNOSIS — Z992 Dependence on renal dialysis: Secondary | ICD-10-CM | POA: Diagnosis not present

## 2023-04-24 DIAGNOSIS — N186 End stage renal disease: Secondary | ICD-10-CM | POA: Diagnosis not present

## 2023-04-24 DIAGNOSIS — N2581 Secondary hyperparathyroidism of renal origin: Secondary | ICD-10-CM | POA: Diagnosis not present

## 2023-04-25 NOTE — Progress Notes (Signed)
[name], [name] ([id number]) [id number] Page 1 of 8 Visit Report for 04/18/2023 Arrival Information Details Patient Name: Date of Service: [name], [name] 04/18/2023 11:45 A M Medical Record Number: [id number] Patient Account Number: [id number] Date of Birth/Sex: Treating RN: [birth year] (72 y.o. M) Primary Care [name]: [name] Other Other Clinician: Referring [name]: Treating [name]/Extender: [name] Weeks in Treatment: 26 Visit Information History Since Last Visit All ordered tests and consults were completed: No Patient Arrived: Ambulatory Added or deleted any medications: No Arrival Time: 11:42 Any new allergies or adverse reactions: No Accompanied By: self Had a fall or experienced change in No Transfer Assistance: None activities of daily living that may affect Patient Identification Verified: Yes risk of falls: Secondary Verification Process Completed: Yes Signs or symptoms of abuse/neglect since last visito No Patient Requires Transmission-Based Precautions: No Hospitalized since last visit: No Patient Has Alerts: No Implantable device outside of the clinic excluding No cellular tissue based products placed in the center since last visit: Pain Present Now: No Electronic Signature(s) Signed: 04/18/2023 2:03:19 PM By: [name] Entered By: [name] on 04/18/2023 11:42:59 -------------------------------------------------------------------------------- Encounter Discharge Information Details Patient Name: Date of Service: [name], [name] 04/18/2023 11:45 A M Medical Record Number: [id number] Patient Account Number: [id number] Date of Birth/Sex: Treating RN: November 26, 1950 (72 y.o. [name] Primary Care [name]: [name] Other Other Clinician: Referring [name]: Treating [name]/Extender: [name] Weeks in Treatment: 26 Encounter Discharge Information Items Post Procedure Vitals Discharge  Condition: Stable Temperature (F): 97.7 Ambulatory Status: Cane Pulse (bpm): 62 Discharge Destination: Home Respiratory Rate (breaths/min): 18 Transportation: Private Auto Blood Pressure (mmHg): 140/67 Accompanied By: self Schedule Follow-up Appointment: Yes Clinical Summary of Care: Patient Declined Electronic Signature(s) Signed: 04/25/2023 11:11:00 AM By: [name] Entered By: [name] on 04/18/2023 12:14:31 [name], [name] ([id number]) [id number] Page 2 of 8 -------------------------------------------------------------------------------- Lower Extremity Assessment Details Patient Name: Date of ServiceDorise [name], [name] 04/18/2023 11:45 A M Medical Record Number: [id number] Patient Account Number: [id number] Date of Birth/Sex: Treating RN: 1951/04/15 (72 y.o. [name] Primary Care [name]: [name] Other Other Clinician: Referring [name]: Treating [name]/Extender: [name] Weeks in Treatment: 26 Edema Assessment Assessed: [Left: No] [Right: No] Edema: [Left: N] [Right: o] Calf Left: Right: Point of Measurement: From Medial Instep 36 cm Ankle Left: Right: Point of Measurement: From Medial Instep 23 cm Vascular Assessment Pulses: Dorsalis Pedis Palpable: [Right:Yes] Extremity colors, hair growth, and conditions: Hair Growth on Extremity: [Right:Yes] Temperature of Extremity: [Right:Warm] Capillary Refill: [Right:< 3 seconds] Dependent Rubor: [Right:No] Blanched when Elevated: [Right:No No] Toe Nail Assessment Left: Right: Thick: No Discolored: No Deformed: Yes Improper Length and Hygiene: No Electronic Signature(s) Signed: 04/25/2023 11:11:00 AM By: [name] Entered By: [name] on 04/18/2023 11:49:51 -------------------------------------------------------------------------------- Multi Wound Chart Details Patient Name: Date of Service: [name], [name] 04/18/2023 11:45 A M Medical  Record Number: [id number] Patient Account Number: [id number] Date of Birth/Sex: Treating RN: 02-23-1951 (72 y.o. M) Primary Care [name]: [name] Other Other Clinician: Referring Jaydeen Darley: Treating [name]/Extender: [name] Weeks in Treatment: 26 Vital Signs Height(in): 72 Pulse(bpm): 101 Weight(lbs): 185 Blood Pressure(mmHg): 152/70 Body Mass Index(BMI): 25.1 [name], [name] ([id number]) [id number] Page 3 of 8 Temperature(F): 98.0 Respiratory Rate(breaths/min): 18 [1:Photos:] [N/A:N/A] Right, Medial T Great oe N/A N/A Wound Location: Pressure Injury N/A N/A Wounding Event: Diabetic Wound/Ulcer of the Lower N/A N/A Primary Etiology: Extremity Acute Osteomyelitis N/A N/A Secondary Etiology: Cataracts, Hypertension, Type II N/A N/A Comorbid History:  Diabetes, Osteomyelitis, Neuropathy, Confinement Anxiety 08/16/2022 N/A N/A Date Acquired: 25 N/A N/A Weeks of Treatment: Open N/A N/A Wound Status: No N/A N/A Wound Recurrence: 0.1x0.1x0.2 N/A N/A Measurements L x W x D (cm) 0.008 N/A N/A A (cm) : rea 0.002 N/A N/A Volume (cm) : 97.10% N/A N/A % Reduction in A rea: 98.20% N/A N/A % Reduction in Volume: Grade 3 N/A N/A Classification: Small N/A N/A Exudate A mount: Serosanguineous N/A N/A Exudate Type: red, brown N/A N/A Exudate Color: Distinct, outline attached N/A N/A Wound Margin: Small (1-33%) N/A N/A Granulation A mount: Red N/A N/A Granulation Quality: Large (67-100%) N/A N/A Necrotic A mount: Fat Layer (Subcutaneous Tissue): Yes N/A N/A Exposed Structures: Bone: Yes Fascia: No Tendon: No Muscle: No Joint: No Medium (34-66%) N/A N/A Epithelialization: Debridement - Selective/Open Wound N/A N/A Debridement: Pre-procedure Verification/Time Out 12:00 N/A N/A Taken: Lidocaine 4% Topical Solution N/A N/A Pain Control: Necrotic/Eschar, Slough N/A N/A Tissue Debrided: Non-Viable Tissue N/A  N/A Level: 0.01 N/A N/A Debridement A (sq cm): rea Curette N/A N/A Instrument: Minimum N/A N/A Bleeding: Pressure N/A N/A Hemostasis A chieved: 0 N/A N/A Procedural Pain: 0 N/A N/A Post Procedural Pain: Procedure was tolerated well N/A N/A Debridement Treatment Response: 0.1x0.1x0.2 N/A N/A Post Debridement Measurements L x W x D (cm) 0.002 N/A N/A Post Debridement Volume: (cm) Callus: Yes N/A N/A Periwound Skin Texture: Maceration: Yes N/A N/A Periwound Skin Moisture: No Abnormalities Noted N/A N/A Periwound Skin Color: No Abnormality N/A N/A Temperature: Debridement N/A N/A Procedures Performed: Treatment Notes Wound #1 (Toe Great) Wound Laterality: Right, Medial Cleanser Soap and Water Discharge Instruction: May shower and wash wound with dial antibacterial soap and water prior to dressing change. Wound Cleanser Discharge Instruction: Cleanse the wound with wound cleanser prior to applying a clean dressing using gauze sponges, not tissue or cotton balls. Peri-Wound Care Topical [place] ([id number]) [id number] Page 4 of 8 Gentamicin Discharge Instruction: Apply with Mupirocin (Mix Mupirocin and Getamicin ointment) Mupirocin Ointment Discharge Instruction: Apply Mupirocin (Bactroban) with Gentamicinas instructed Primary Dressing Secondary Dressing Woven Gauze Sponges 2x2 in Discharge Instruction: Apply over primary dressing as directed. Secured With Conforming Stretch Gauze Bandage, Sterile 2x75 (in/in) Discharge Instruction: Secure with stretch gauze as directed. 72M Medipore Soft Cloth Surgical T 2x10 (in/yd) ape Discharge Instruction: Secure with tape as directed. Compression Wrap Compression Stockings Add-Ons Electronic Signature(s) Signed: 04/18/2023 12:28:40 PM By: [name] MD FACS Entered By: [name] on 04/18/2023  12:28:40 -------------------------------------------------------------------------------- Multi-Disciplinary Care Plan Details Patient Name: Date of Service: [facility], [name] 04/18/2023 11:45 A M Medical Record Number: [id number] Patient Account Number: [id number] Date of Birth/Sex: Treating RN: 12-19-1950 (72 y.o. [name] Primary Care [name]: [name] Other Other Clinician: Referring Azarian Starace: Treating Fernando Torry/Extender: [name] Weeks in Treatment: 26 Multidisciplinary Care Plan reviewed with physician Active Inactive Nutrition Nursing Diagnoses: Impaired glucose control: actual or potential Goals: Patient/caregiver will maintain therapeutic glucose control Date Initiated: 10/12/2022 Target Resolution Date: 06/02/2023 Goal Status: Active Interventions: Provide education on elevated blood sugars and impact on wound healing Provide education on nutrition Treatment Activities: Dietary management education, guidance and counseling : 10/12/2022 Giving encouragement to exercise : 10/12/2022 Notes: Osteomyelitis Nursing Diagnoses: Infection: osteomyelitis [name], [name] ([id number]) [id number] Page 5 of 8 Goals: Patient/caregiver will verbalize understanding of disease process and disease management Date Initiated: 10/12/2022 Target Resolution Date: 06/02/2023 Goal Status: Active Interventions: Assess for signs and symptoms of osteomyelitis resolution every visit Provide education on osteomyelitis Treatment Activities: MRI :  08/16/2022 Notes: Electronic Signature(s) Signed: 04/25/2023 11:11:00 AM By: [name] Entered By: [name] on 04/18/2023 11:52:50 -------------------------------------------------------------------------------- Pain Assessment Details Patient Name: Date of Service: [name], [name] 04/18/2023 11:45 A M Medical Record Number: [id number] Patient Account Number: [id number] Date of Birth/Sex:  Treating RN: 20-Jul-1951 (72 y.o. M) Primary Care Salome Hautala: [name] Other Other Clinician: Referring Cagney Degrace: Treating Yalena Colon/Extender: [name] Weeks in Treatment: 26 Active Problems Location of Pain Severity and Description of Pain Patient Has Paino No Site Locations Pain Management and Medication Current Pain Management: Electronic Signature(s) Signed: 04/18/2023 2:03:19 PM By: [name] Entered By: [name] on 04/18/2023 11:43:27 Pippins, Norval ([id number]) [id number] Page 6 of 8 -------------------------------------------------------------------------------- Patient/Caregiver Education Details Patient Name: Date of Service: [facility], [name] 7/17/2024andnbsp11:45 A M Medical Record Number: [id number] Patient Account Number: [id number] Date of Birth/Gender: Treating RN: Apr 04, 1951 (72 y.o. [name] Primary Care Physician: [name] Other Other Clinician: Referring Physician: Treating Physician/Extender: Gracy Racer in Treatment: 26 Education Assessment Education Provided To: Patient Education Topics Provided Wound/Skin Impairment: Methods: Explain/Verbal Responses: State content correctly Electronic Signature(s) Signed: 04/25/2023 11:11:00 AM By: [name] Entered By: [name] on 04/18/2023 11:53:07 -------------------------------------------------------------------------------- Wound Assessment Details Patient Name: Date of Service: [facility], [name] 04/18/2023 11:45 A M Medical Record Number: [id number] Patient Account Number: [id number] Date of Birth/Sex: Treating RN: July 10, 1951 (72 y.o. M) Primary Care Tatumn Corbridge: [name] Other Other Clinician: Referring Robecca Fulgham: Treating Laszlo Ellerby/Extender: [name] Weeks in Treatment: 26 Wound Status Wound Number: 1 Primary Diabetic Wound/Ulcer of the Lower Extremity Etiology: Wound Location: Right, Medial T  Great oe Secondary Acute Osteomyelitis Wounding Event: Pressure Injury Etiology: Date Acquired: 08/16/2022 Wound Open Weeks Of Treatment: 26 Status: Clustered Wound: No Comorbid Cataracts, Hypertension, Type II Diabetes, Osteomyelitis, History: Neuropathy, Confinement Anxiety Photos Wound Measurements Length: (cm) 0.1 Width: (cm) 0.1 Depth: (cm) 0.2 Area: (cm) 0.0 Volume: (cm) 0.0 [name], [name] ([id number]) Wound Description Classification: Grade 3 Wound Margin: Distinct, outline attached Exudate Amount: Small Exudate Type: Serosanguineous Exudate Color: red, brown Foul Odor After Cleansing: Slough/Fibrino % Reduction in Area: 97.1% % Reduction in Volume: 98.2% Epithelialization: Medium (34-66%) 08 Tunneling: No 02 Undermining: No [id number] Page 7 of 8 No Yes Wound Bed Granulation Amount: Small (1-33%) Exposed Structure Granulation Quality: Red Fascia Exposed: No Necrotic Amount: Large (67-100%) Fat Layer (Subcutaneous Tissue) Exposed: Yes Necrotic Quality: Adherent Slough Tendon Exposed: No Muscle Exposed: No Joint Exposed: No Bone Exposed: Yes Periwound Skin Texture Texture Color No Abnormalities Noted: Yes No Abnormalities Noted: Yes Moisture Temperature / Pain No Abnormalities Noted: Yes Temperature: No Abnormality Treatment Notes Wound #1 (Toe Great) Wound Laterality: Right, Medial Cleanser Soap and Water Discharge Instruction: May shower and wash wound with dial antibacterial soap and water prior to dressing change. Wound Cleanser Discharge Instruction: Cleanse the wound with wound cleanser prior to applying a clean dressing using gauze sponges, not tissue or cotton balls. Peri-Wound Care Topical Gentamicin Discharge Instruction: Apply with Mupirocin (Mix Mupirocin and Getamicin ointment) Mupirocin Ointment Discharge Instruction: Apply Mupirocin (Bactroban) with Gentamicinas instructed Primary Dressing Secondary  Dressing Woven Gauze Sponges 2x2 in Discharge Instruction: Apply over primary dressing as directed. Secured With Conforming Stretch Gauze Bandage, Sterile 2x75 (in/in) Discharge Instruction: Secure with stretch gauze as directed. 91M Medipore Soft Cloth Surgical T 2x10 (in/yd) ape Discharge Instruction: Secure with tape as directed. Compression Wrap Compression Stockings Add-Ons Electronic Signature(s) Signed: 04/25/2023 11:11:00 AM By: [name] Entered By: [name] on 04/18/2023 11:52:11 --------------------------------------------------------------------------------  Vitals Details Patient Name: Date of Service: [place], [name] 04/18/2023 11:45 [place] [name], [name] ([id number]) [id number] Page 8 of 8 Medical Record Number: [id number] Patient Account Number: [id number] Date of Birth/Sex: Treating RN: [birth year] (72 y.o. M) Primary Care Jarrod Bodkins: [name] Other Other Clinician: Referring Samiah Ricklefs: Treating Lucillie Kiesel/Extender: [name] Weeks in Treatment: 26 Vital Signs Time Taken: 11:43 Temperature (F): 98.0 Height (in): 72 Pulse (bpm): 101 Weight (lbs): 185 Respiratory Rate (breaths/min): 18 Body Mass Index (BMI): 25.1 Blood Pressure (mmHg): 152/70 Reference Range: 80 - 120 mg / dl Electronic Signature(s) Signed: 04/18/2023 2:03:19 PM By: [name] Entered By: [name] on 04/18/2023 11:43:21

## 2023-04-25 NOTE — Progress Notes (Signed)
[name], [name] ([id number]) [id number] Page 1 of 10 Visit Report for 04/18/2023 Chief Complaint Document Details Patient Name: Date of Service: [facility] [name], [name] 04/18/2023 11:45 A M Medical Record Number: [id number] Patient Account Number: [id number] Date of Birth/Sex: Treating RN: May 27, 1951 (72 y.o. M) Primary Care Provider: [name] Other Other Clinician: Referring Provider: Treating Provider/Extender: [name] Weeks in Treatment: 26 Information Obtained from: Patient Chief Complaint Patients presents for treatment of an open diabetic ulcer on the right great toe Electronic Signature(s) Signed: 04/18/2023 12:29:50 PM By: [name] MD FACS Entered By: [name] on 04/18/2023 12:29:50 -------------------------------------------------------------------------------- Debridement Details Patient Name: Date of Service: [facility] [name], [name] 04/18/2023 11:45 A M Medical Record Number: [id number] Patient Account Number: [id number] Date of Birth/Sex: Treating RN: [birth year] (72 y.o. [name] Primary Care Provider: [name] Other Other Clinician: Referring Provider: Treating Provider/Extender: [name] Weeks in Treatment: 26 Debridement Performed for Assessment: Wound #1 Right,Medial T Great oe Performed By: Physician [name], MD Debridement Type: Debridement Severity of Tissue Pre Debridement: Fat layer exposed Level of Consciousness (Pre-procedure): Awake and Alert Pre-procedure Verification/Time Out Yes - 12:00 Taken: Start Time: 12:02 Pain Control: Lidocaine 4% T opical Solution Percent of Wound Bed Debrided: 100% T Area Debrided (cm): otal 0.01 Tissue and other material debrided: Viable, Non-Viable, Eschar, Slough, Slough Level: Non-Viable Tissue Debridement Description: Selective/Open Wound Instrument: Curette Bleeding: Minimum Hemostasis Achieved: Pressure End Time: 12:05 Procedural  Pain: 0 Post Procedural Pain: 0 Response to Treatment: Procedure was tolerated well Level of Consciousness (Post- Awake and Alert procedure): Post Debridement Measurements of Total Wound Length: (cm) 0.1 Width: (cm) 0.1 Depth: (cm) 0.2 Volume: (cm) 0.002 Character of Wound/Ulcer Post Debridement: Stable [name], [name] ([id number]) [id number] Page 2 of 10 Severity of Tissue Post Debridement: Fat layer exposed Post Procedure Diagnosis Same as Pre-procedure Notes Scribed for Dr Lady Gary by Brenton Grills RN Electronic Signature(s) Signed: 04/18/2023 12:38:39 PM By: [name] MD FACS Signed: 04/25/2023 11:11:00 AM By: Brenton Grills Entered By: Brenton Grills on 04/18/2023 12:03:26 -------------------------------------------------------------------------------- HPI Details Patient Name: Date of Service: [facility] [name], [name] 04/18/2023 11:45 A M Medical Record Number: [id number] Patient Account Number: [id number] Date of Birth/Sex: Treating RN: [birth year] (72 y.o. M) Primary Care Provider: [name] Other Other Clinician: Referring Provider: Treating Provider/Extender: [name] Weeks in Treatment: 26 History of Present Illness HPI Description: ADMISSION 10/12/2022 This is a 72 year old type II diabetic (last hemoglobin A1c 7.7 in October 2023) with stage V chronic kidney disease and hypertension. He has been followed by podiatry for an ulcer on his right great toe. He has a history of prior toe and foot infections requiring amputation. In November 2023, he had an outpatient MRI that demonstrated osteomyelitis. He was admitted to the hospital for IV antibiotics and subsequently underwent irrigation and debridement of the toe with biopsy and placement of married 3 layer matrix. Cultures grew out MRSA and osteomyelitis was seen on pathology. He was followed by infectious disease and given a course of doxycycline and Augmentin. He completed this  course at the end of December. He has been evaluated by vascular surgery with formal ABIs as well as consultation. He does have adequate blood flow at least to the dorsalis pedis. He last saw podiatry on January 4 of this year where he underwent debridement. It was felt he would benefit from specialized wound care and he was referred to our center for further evaluation and management. On the dorsal aspect of his right great  toe, there is a circular ulcer. There is a fair amount of undermining from callus and dry skin. The surface is a bit pale and fibrotic, but there is no obvious necrosis. 10/19/2022: He has been approved by his insurance to undergo hyperbaric oxygen therapy. EKG has been obtained and is normal. We are still waiting on his chest x-ray. The wound is clean with just a little bit of periwound callus and slough on the surface. Tendon is exposed. 10/26/2022: Unfortunately, the required co-pay from his insurance is cost prohibitive for him and he will not be able to receive hyperbaric oxygen therapy. Some moisture got under the dry skin around his wound and lifted it off but there is good epithelialized tissue beneath this. The exposed tendon has deteriorated. 11/03/2022: After some discussion with the financial office, it has become feasible for the patient to undergo hyperbaric oxygen therapy and he has received his first treatments this week. The wound is a little bit smaller and the tissue has a better color to it, but there is still extensive nonviable subcutaneous tissue and slough present. 11/09/2022: The wound measured a little bit smaller today. There is still slough and nonviable subcu tissue and tendon present. 11/16/2022: The wound continues to contract. There is still substantial depth to it, however, and bone remains exposed at the base. This is also a (early) 30-day update for HBO. 11/27/2022: Unfortunately, the wound has deteriorated. It appears that the tissue has become  macerated. There is a second ulcer on the lateral aspect of his great toe. The nail is lifting off of the bed and there is wet skin lifting off of the underlying tissue. The initial wound is smaller but still has a fair amount of depth to it. The patient does report quite a bit more drainage since his last visit. 12/20/2022: For scheduling reasons, somehow we have missed having a wound care visit in between his hyperbaric oxygen therapy treatments. The 2 openings of the wound have converged into 1 large site. There is necrotic tendon present at the lateral aspect of the great toe. The bone is visible, including the joint space. The area of the initial wound is flush with the surrounding skin. No malodor or purulent drainage. 12/28/2022: The wound is vastly improved this week. The 2 areas of the wound have separated due to tissue bridging and healing the space between. There is no more necrotic tissue visible, although the bone/joint space remains exposed. 01/04/2023: His wound continues to improve. The more medial aspect of the toe is nearly closed. Bone remains exposed laterally. He has been approved for Epicord. He is going to start dialysis next week. 01/11/2023: The medial aspect of the wound is closed completely. The lateral open portion of his wound has contracted and is shallower. There is still bone exposure. He has started dialysis. We are going to apply Epicord today. 01/19/2023: There is good granulation tissue starting to fill in his wound. Bone remains exposed, however. 01/26/2023: The wound is smaller again today. The granulation tissue is a bit hypertrophic. There is still an area of bone that remains exposed. 02/02/2023: The wound is about the same size but has better tissue coverage. There is a protruding point of bone present. His insurance has changed and we are [place], [name] Bame ([id number]) [id number] Page 3 of 10 needing to secure approval again for his  hyperbaric oxygen therapy treatments. 02/09/2023: The wound is quite a bit smaller today and there is robust granulation tissue on the surface. He has  decided to discontinue his hyperbaric oxygen treatments due to the financial issues associated with traveling to the wound care center on a daily basis, as well as his dialysis sessions. 02/16/2023: The wound continues to contract. There is very nice granulation tissue present and no slough or other debris accumulation. 02/22/2023: The wound is quite a bit smaller today. There is still some depth to it and I can still probe bone. 03/02/2023: The wound has contracted further. There is granulation tissue filling in the base. There is still a narrow passage through which I can probe to bone. 03/09/2023: The wound is down to just a very small hole through which I can still probe to bone. The rest of the wound has epithelialized. 03/16/2023: No real change in the wound dimensions. Bone is still exposed at the base. The edges are rolling inward, suggesting the wound has stalled. 03/26/2023: The wound is about a millimeter in diameter. A skinny probe just barely fits through the opening but I still feel bone at the base. 04/02/2023: The wound is just a tiny bit larger today. Bone is still able to be probed at the base. 04/10/2023: The wound is unchanged. The bone remains exposed. We are awaiting insurance approval for [facility]. 04/18/2023: The wound is back down to being about a millimeter in diameter, with a skinny probe barely fitting through the opening. Bone is still directly underneath the surface. No word regarding insurance approval for [facility]. Electronic Signature(s) Signed: 04/18/2023 12:31:49 PM By: [name] MD FACS Entered By: [name] on 04/18/2023 12:31:49 -------------------------------------------------------------------------------- Physical Exam Details Patient Name: Date of Service: [facility] [name], [name] 04/18/2023 11:45 A M Medical Record  Number: [id number] Patient Account Number: [id number] Date of Birth/Sex: Treating RN: [birth year] (72 y.o. M) Primary Care Provider: [name] Other Other Clinician: Referring Provider: Treating Provider/Extender: [name] Weeks in Treatment: 26 Constitutional Hypertensive, asymptomatic. Slightly tachycardic. . . no acute distress. Respiratory Normal work of breathing on room air. Notes 04/18/2023: The wound is back down to being about a millimeter in diameter, with a skinny probe barely fitting through the opening. Bone is still directly underneath the surface. Electronic Signature(s) Signed: 04/18/2023 12:34:49 PM By: [name] MD FACS Entered By: [name] on 04/18/2023 12:34:49 -------------------------------------------------------------------------------- Physician Orders Details Patient Name: Date of Service: [facility], [name] 04/18/2023 11:45 A M Medical Record Number: [id number] Patient Account Number: [id number] Date of Birth/Sex: Treating RN: 1951-03-19 (72 y.o. [name] Primary Care Provider: [name] Other Other Clinician: Referring Provider: Treating Provider/Extender: [name] Weeks in Treatment: 57 Verbal / Phone Orders: No [place], [name] Bame ([id number]) [id number] Page 4 of 10 Diagnosis Coding ICD-10 Coding Code Description L97.514 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.621 Type 2 diabetes mellitus with foot ulcer N18.6 End stage renal disease M86.9 Osteomyelitis, unspecified E11.42 Type 2 diabetes mellitus with diabetic polyneuropathy I10 Essential (primary) hypertension Follow-up Appointments ppointment in 1 week. - Dr. Lady Gary Rm 3 - 2 weeks is permissible if one week is not possible. Return A Anesthetic Wound #1 Right,Medial T Great oe (In clinic) Topical Lidocaine 4% applied to wound bed Cellular or Tissue Based Products Cellular or  Tissue Based Product Type: - epicord #10-Last 03/16/23 Other Cellular or Tissue Based Products Orders/Instructions: - RUN IVR for [facility] (04/02/23) Bathing/ Shower/ Hygiene May shower and wash wound with soap and water. Off-Loading Wound #1 Right,Medial T Great oe Other: - Diabetic shoe Wound Treatment Wound #1 - T  Great oe Wound Laterality: Right, Medial Cleanser: Soap and Water 1 x Per Week/30 Days Discharge Instructions: May shower and wash wound with dial antibacterial soap and water prior to dressing change. Cleanser: Wound Cleanser 1 x Per Week/30 Days Discharge Instructions: Cleanse the wound with wound cleanser prior to applying a clean dressing using gauze sponges, not tissue or cotton balls. Topical: Gentamicin 1 x Per Week/30 Days Discharge Instructions: Apply with Mupirocin (Mix Mupirocin and Getamicin ointment) Topical: Mupirocin Ointment 1 x Per Week/30 Days Discharge Instructions: Apply Mupirocin (Bactroban) with Gentamicinas instructed Secondary Dressing: Woven Gauze Sponges 2x2 in 1 x Per Week/30 Days Discharge Instructions: Apply over primary dressing as directed. Secured With: Insurance underwriter, Sterile 2x75 (in/in) 1 x Per Week/30 Days Discharge Instructions: Secure with stretch gauze as directed. Secured With: 67M Med[employer] Scientist, research (life sciences) Surgical T 2x10 (in/yd) 1 x Per Week/30 Days ape Discharge Instructions: Secure with tape as directed. Electronic Signature(s) Signed: 04/18/2023 12:38:39 PM By: [name] MD FACS Entered By: [name] on 04/18/2023 12:35:01 -------------------------------------------------------------------------------- Problem List Details Patient Name: Date of Service: [name], [name] 04/18/2023 11:45 A M Medical Record Number: [id number] Patient Account Number: [id number] Date of Birth/Sex: Treating RN: Jul 24, 1951 (72 y.o. [name] Primary Care Provider: [name] Other Other Clinician: Referring Provider: Treating  Provider/Extender: [name] [name], [name] ([id number]) [id number] Page 5 of 10 Weeks in Treatment: 26 Active Problems ICD-10 Encounter Code Description Active Date MDM Diagnosis L97.514 Non-pressure chronic ulcer of other part of right foot with necrosis of bone 10/12/2022 No Yes E11.621 Type 2 diabetes mellitus with foot ulcer 10/12/2022 No Yes N18.6 End stage renal disease 10/12/2022 No Yes M86.9 Osteomyelitis, unspecified 10/12/2022 No Yes E11.42 Type 2 diabetes mellitus with diabetic polyneuropathy 10/12/2022 No Yes I10 Essential (primary) hypertension 10/12/2022 No Yes Inactive Problems Resolved Problems Electronic Signature(s) Signed: 04/18/2023 12:28:31 PM By: [name] MD FACS Entered By: [name] on 04/18/2023 12:28:31 -------------------------------------------------------------------------------- Progress Note Details Patient Name: Date of Service: [facility] [name], [name] 04/18/2023 11:45 A M Medical Record Number: [id number] Patient Account Number: [id number] Date of Birth/Sex: Treating RN: January 01, 1951 (72 y.o. M) Primary Care Provider: [name] Other Other Clinician: Referring Provider: Treating Provider/Extender: [name] Weeks in Treatment: 26 Subjective Chief Complaint Information obtained from Patient Patients presents for treatment of an open diabetic ulcer on the right great toe History of Present Illness (HPI) ADMISSION 10/12/2022 This is a 72 year old type II diabetic (last hemoglobin A1c 7.7 in October 2023) with stage V chronic kidney disease and hypertension. He has been followed by podiatry for an ulcer on his right great toe. He has a history of prior toe and foot infections requiring amputation. In November 2023, he had an outpatient MRI that demonstrated osteomyelitis. He was admitted to the hospital for IV antibiotics and subsequently underwent irrigation and debridement of the toe  with biopsy and placement of married 3 layer matrix. Cultures grew out MRSA and osteomyelitis was seen on pathology. He was followed by infectious disease and given a course of doxycycline and Augmentin. He completed this course at the end of December. He has been evaluated by vascular surgery with formal ABIs as well as consultation. He does have adequate blood flow at least to the dorsalis pedis. He last saw podiatry on January 4 of this year where he underwent debridement. It was felt he would benefit from specialized wound care and he was referred to our center for further evaluation and management. On the dorsal  aspect of his right great toe, there is a circular ulcer. There is a fair amount of undermining from callus and dry skin. The surface is a bit pale [place], [place] ([id number]) [id number].pdf Page 6 of 10 and fibrotic, but there is no obvious necrosis. 10/19/2022: He has been approved by his insurance to undergo hyperbaric oxygen therapy. EKG has been obtained and is normal. We are still waiting on his chest x-ray. The wound is clean with just a little bit of periwound callus and slough on the surface. Tendon is exposed. 10/26/2022: Unfortunately, the required co-pay from his insurance is cost prohibitive for him and he will not be able to receive hyperbaric oxygen therapy. Some moisture got under the dry skin around his wound and lifted it off but there is good epithelialized tissue beneath this. The exposed tendon has deteriorated. 11/03/2022: After some discussion with the financial office, it has become feasible for the patient to undergo hyperbaric oxygen therapy and he has received his first treatments this week. The wound is a little bit smaller and the tissue has a better color to it, but there is still extensive nonviable subcutaneous tissue and slough present. 11/09/2022: The wound measured a little bit smaller today. There is still slough and nonviable subcu  tissue and tendon present. 11/16/2022: The wound continues to contract. There is still substantial depth to it, however, and bone remains exposed at the base. This is also a (early) 30-day update for HBO. 11/27/2022: Unfortunately, the wound has deteriorated. It appears that the tissue has become macerated. There is a second ulcer on the lateral aspect of his great toe. The nail is lifting off of the bed and there is wet skin lifting off of the underlying tissue. The initial wound is smaller but still has a fair amount of depth to it. The patient does report quite a bit more drainage since his last visit. 12/20/2022: For scheduling reasons, somehow we have missed having a wound care visit in between his hyperbaric oxygen therapy treatments. The 2 openings of the wound have converged into 1 large site. There is necrotic tendon present at the lateral aspect of the great toe. The bone is visible, including the joint space. The area of the initial wound is flush with the surrounding skin. No malodor or purulent drainage. 12/28/2022: The wound is vastly improved this week. The 2 areas of the wound have separated due to tissue bridging and healing the space between. There is no more necrotic tissue visible, although the bone/joint space remains exposed. 01/04/2023: His wound continues to improve. The more medial aspect of the toe is nearly closed. Bone remains exposed laterally. He has been approved for Epicord. He is going to start dialysis next week. 01/11/2023: The medial aspect of the wound is closed completely. The lateral open portion of his wound has contracted and is shallower. There is still bone exposure. He has started dialysis. We are going to apply Epicord today. 01/19/2023: There is good granulation tissue starting to fill in his wound. Bone remains exposed, however. 01/26/2023: The wound is smaller again today. The granulation tissue is a bit hypertrophic. There is still an area of bone that remains  exposed. 02/02/2023: The wound is about the same size but has better tissue coverage. There is a protruding point of bone present. His insurance has changed and we are needing to secure approval again for his hyperbaric oxygen therapy treatments. 02/09/2023: The wound is quite a bit smaller today and there is robust granulation tissue  on the surface. He has decided to discontinue his hyperbaric oxygen treatments due to the financial issues associated with traveling to the wound care center on a daily basis, as well as his dialysis sessions. 02/16/2023: The wound continues to contract. There is very nice granulation tissue present and no slough or other debris accumulation. 02/22/2023: The wound is quite a bit smaller today. There is still some depth to it and I can still probe bone. 03/02/2023: The wound has contracted further. There is granulation tissue filling in the base. There is still a narrow passage through which I can probe to bone. 03/09/2023: The wound is down to just a very small hole through which I can still probe to bone. The rest of the wound has epithelialized. 03/16/2023: No real change in the wound dimensions. Bone is still exposed at the base. The edges are rolling inward, suggesting the wound has stalled. 03/26/2023: The wound is about a millimeter in diameter. A skinny probe just barely fits through the opening but I still feel bone at the base. 04/02/2023: The wound is just a tiny bit larger today. Bone is still able to be probed at the base. 04/10/2023: The wound is unchanged. The bone remains exposed. We are awaiting insurance approval for [facility]. 04/18/2023: The wound is back down to being about a millimeter in diameter, with a skinny probe barely fitting through the opening. Bone is still directly underneath the surface. No word regarding insurance approval for [facility]. Patient History Information obtained from Patient. Family History Diabetes - Mother,Siblings, Heart Disease - Siblings,  Hypertension - Siblings, Seizures - Father, Stroke - Siblings, No family history of Cancer, Hereditary Spherocytosis, Kidney Disease, Lung Disease, Thyroid Problems, Tuberculosis. Social History Former smoker, Marital Status - Married, Alcohol Use - Rarely, Drug Use - No History, Caffeine Use - Daily. Medical History Eyes Patient has history of Cataracts - rt eye Ear/Nose/Mouth/Throat Denies history of Chronic sinus problems/congestion Hematologic/Lymphatic Denies history of Anemia, Hemophilia, Human Immunodeficiency Virus, Lymphedema, Sickle Cell Disease Respiratory Denies history of Aspiration, Asthma, Chronic Obstructive Pulmonary Disease (COPD), Pneumothorax, Tuberculosis Cardiovascular Patient has history of Hypertension Gastrointestinal Denies history of Cirrhosis , Colitis, Crohns, Hepatitis A, Hepatitis B, Hepatitis C Endocrine Patient has history of Type II Diabetes Genitourinary Denies history of End Stage Renal Disease Immunological [name], [name] ([id number]) [id number] Page 7 of 10 Denies history of Lupus Erythematosus, Raynauds, Scleroderma Integumentary (Skin) Denies history of History of Burn Musculoskeletal Patient has history of Osteomyelitis - MRI confirmed (08/16/22) Denies history of Gout Neurologic Patient has history of Neuropathy Psychiatric Patient has history of Confinement Anxiety Denies history of Anorexia/bulimia Hospitalization/Surgery History - AV fistula place left upper arm 10/20/22. Medical A Surgical History Notes nd Genitourinary CKD IV Objective Constitutional Hypertensive, asymptomatic. Slightly tachycardic. no acute distress. Vitals Time Taken: 11:43 AM, Height: 72 in, Weight: 185 lbs, BMI: 25.1, Temperature: 98.0 F, Pulse: 101 bpm, Respiratory Rate: 18 breaths/min, Blood Pressure: 152/70 mmHg. Respiratory Normal work of breathing on room air. General Notes: 04/18/2023: The wound is back down to being about  a millimeter in diameter, with a skinny probe barely fitting through the opening. Bone is still directly underneath the surface. Integumentary (Hair, Skin) Wound #1 status is Open. Original cause of wound was Pressure Injury. The date acquired was: 08/16/2022. The wound has been in treatment 26 weeks. The wound is located on the Right,Medial T Great. The wound measures 0.1cm length x 0.1cm width x 0.2cm depth; 0.008cm^2 area and 0.002cm^3 volume. oe There is bone  and Fat Layer (Subcutaneous Tissue) exposed. There is no tunneling or undermining noted. There is a small amount of serosanguineous drainage noted. The wound margin is distinct with the outline attached to the wound base. There is small (1-33%) red granulation within the wound bed. There is a large (67-100%) amount of necrotic tissue within the wound bed including Adherent Slough. The periwound skin appearance had no abnormalities noted for texture. The periwound skin appearance had no abnormalities noted for moisture. The periwound skin appearance had no abnormalities noted for color. Periwound temperature was noted as No Abnormality. 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 There was a Selective/Open Wound Non-Viable Tissue Debridement with a total area of 0.01 sq cm performed by [name], MD. With the following instrument(s): Curette to remove Viable and Non-Viable tissue/material. Material removed includes Eschar and Slough and after achieving pain control using Lidocaine 4%  Topical Solution. No specimens were taken. A time out was conducted at 12:00, prior to the start of the procedure. A Minimum amount of bleeding was controlled with Pressure. The procedure was tolerated well with a pain level of 0 throughout and a pain level of 0 following the procedure. Post Debridement Measurements: 0.1cm length x 0.1cm width x 0.2cm depth; 0.002cm^3 volume. Character of Wound/Ulcer Post Debridement is stable. Severity of Tissue Post Debridement is: Fat layer exposed. Post procedure Diagnosis Wound #1: Same as Pre-Procedure General Notes: Scribed for Dr Lady Gary by Brenton Grills RN. [name], [name] ([id number]) [id number] Page 8 of 10 Plan Follow-up Appointments: Return Appointment in 1 week. - Dr. Lady Gary Rm 3 - 2 weeks is permissible if one week is not possible. Anesthetic: Wound #1 Right,Medial T Great: oe (In clinic) Topical Lidocaine 4% applied to wound bed Cellular or Tissue Based Products: Cellular or Tissue Based Product Type: - epicord #10-Last 03/16/23 Other Cellular or Tissue Based Products Orders/Instructions: - RUN IVR for [facility] (04/02/23) Bathing/ Shower/ Hygiene: May shower and wash wound with soap and water. Off-Loading: Wound #1 Right,Medial T Great: oe Other: - Diabetic shoe WOUND #1: - T Great Wound Laterality: Right, Medial oe Cleanser: Soap and Water 1 x Per Week/30 Days Discharge Instructions: May shower and wash wound with dial antibacterial soap and water prior to dressing change. Cleanser: Wound Cleanser 1 x Per Week/30 Days Discharge Instructions: Cleanse the wound with wound cleanser prior to applying a clean dressing using gauze sponges, not tissue or cotton balls. Topical: Gentamicin 1 x Per Week/30 Days Discharge Instructions: Apply with Mupirocin (Mix Mupirocin and Getamicin ointment) Topical: Mupirocin Ointment 1 x Per Week/30 Days Discharge Instructions: Apply Mupirocin (Bactroban) with Gentamicinas  instructed Secondary Dressing: Woven Gauze Sponges 2x2 in 1 x Per Week/30 Days Discharge Instructions: Apply over primary dressing as directed. Secured With: Insurance underwriter, Sterile 2x75 (in/in) 1 x Per Week/30 Days Discharge Instructions: Secure with stretch gauze as directed. Secured With: 47M [employer] Scientist, research (life sciences) Surgical T 2x10 (in/yd) 1 x Per Week/30 Days ape Discharge Instructions: Secure with tape as directed. 04/18/2023: The wound is back down to being about a millimeter in diameter, with a skinny probe barely fitting through the opening.  Bone is still directly underneath the surface. I used a curette to debride a small amount of slough from his wound. We will continue the mixture of topical gentamicin and mupirocin with quarter inch packing strips; anticipate he will be very difficult to pack the wound at this point but he will continue to make the effort. Follow-up in 1 week. Electronic Signature(s) Signed: 04/18/2023 12:35:48 PM By: [name] MD FACS Entered By: [name] on 04/18/2023 12:35:48 -------------------------------------------------------------------------------- HxROS Details Patient Name: Date of Service: [facility] [name], [name] 04/18/2023 11:45 A M Medical Record Number: [id number] Patient Account Number: [id number] Date of Birth/Sex: Treating RN: [birth year] (72 y.o. M) Primary Care Provider: [name] Other Other Clinician: Referring Provider: Treating Provider/Extender: [name] Weeks in Treatment: 26 Information Obtained From Patient Eyes Medical History: Positive for: Cataracts - rt eye Ear/Nose/Mouth/Throat Medical History: Negative for: Chronic sinus problems/congestion Hematologic/Lymphatic Medical History: Negative for: Anemia; Hemophilia; Human Immunodeficiency Virus; Lymphedema; Sickle Cell Disease Casa, Page ([id number]) [id number] Page 9 of 10 Respiratory Medical  History: Negative for: Aspiration; Asthma; Chronic Obstructive Pulmonary Disease (COPD); Pneumothorax; Tuberculosis Cardiovascular Medical History: Positive for: Hypertension Gastrointestinal Medical History: Negative for: Cirrhosis ; Colitis; Crohns; Hepatitis A; Hepatitis B; Hepatitis C Endocrine Medical History: Positive for: Type II Diabetes Time with diabetes: 2000 Treated with: Insulin Blood sugar tested every day: Yes Tested : Genitourinary Medical History: Negative for: End Stage Renal Disease Past Medical History Notes: CKD IV Immunological Medical History: Negative for: Lupus Erythematosus; Raynauds; Scleroderma Integumentary (Skin) Medical History: Negative for: History of Burn Musculoskeletal Medical History: Positive for: Osteomyelitis - MRI confirmed (08/16/22) Negative for: Gout Neurologic Medical History: Positive for: Neuropathy Psychiatric Medical History: Positive for: Confinement Anxiety Negative for: Anorexia/bulimia HBO Extended History Items Eyes: Cataracts Immunizations Pneumococcal Vaccine: Received Pneumococcal Vaccination: Yes Received Pneumococcal Vaccination On or After 60th Birthday: Yes Implantable Devices None Hospitalization / Surgery History Type of Hospitalization/Surgery AV fistula place left upper arm 10/20/22 Family and Social History [place], [name] Bame ([id number]) [id number] Page 10 of 10 Cancer: No; Diabetes: Yes - Mother,Siblings; Heart Disease: Yes - Siblings; Hereditary Spherocytosis: No; Hypertension: Yes - Siblings; Kidney Disease: No; Lung Disease: No; Seizures: Yes - Father; Stroke: Yes - Siblings; Thyroid Problems: No; Tuberculosis: No; Former smoker; Marital Status - Married; Alcohol Use: Rarely; Drug Use: No History; Caffeine Use: Daily; Financial Concerns: No; Food, Clothing or Shelter Needs: No; Support System Lacking: No; Transportation Concerns: No Electronic Signature(s) Signed:  04/18/2023 12:38:39 PM By: [name] MD FACS Entered By: [name] on 04/18/2023 12:31:56 -------------------------------------------------------------------------------- SuperBill Details Patient Name: Date of Service: [facility] [name], [name] 04/18/2023 Medical Record Number: [id number] Patient Account Number: [id number] Date of Birth/Sex: Treating RN: Nov 28, 1950 (72 y.o. [name] Primary Care Provider: [name] Other Other Clinician: Referring Provider: Treating Provider/Extender: [name] Weeks in Treatment: 26 Diagnosis Coding ICD-10 Codes Code Description [id number] 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.621 Type 2 diabetes mellitus with foot ulcer N18.6 End stage renal disease M86.9 Osteomyelitis, unspecified E11.42 Type 2 diabetes mellitus with diabetic polyneuropathy I10 Essential (primary) hypertension Facility Procedures : CPT4 Code: [id number] Description: 97597 - DEBRIDE WOUND 1ST 20 SQ CM OR < ICD-10 Diagnosis Description L97.514 Non-pressure chronic ulcer of other part of right foot with necrosis of bone Modifier: Quantity: 1 Physician Procedures : CPT4 Code Description Modifier [id number] - WC PHYS LEVEL 4 - EST PT 25 ICD-10 Diagnosis Description L97.514 Non-pressure chronic ulcer of other part of right  foot with necrosis of bone E11.621 Type 2 diabetes mellitus with foot ulcer N18.6 End  stage renal disease M86.9 Osteomyelitis, unspecified Quantity: 1 : [id number] 97597 - WC PHYS DEBR WO ANESTH 20 SQ CM ICD-10 Diagnosis Description L97.514 Non-pressure chronic ulcer of other part of right foot with necrosis of bone Quantity: 1 Electronic Signature(s) Signed: 04/18/2023 12:36:28 PM By: [name] MD FACS Entered By: [name] on 04/18/2023 12:36:28

## 2023-04-26 DIAGNOSIS — Z992 Dependence on renal dialysis: Secondary | ICD-10-CM | POA: Diagnosis not present

## 2023-04-26 DIAGNOSIS — N2581 Secondary hyperparathyroidism of renal origin: Secondary | ICD-10-CM | POA: Diagnosis not present

## 2023-04-26 DIAGNOSIS — N186 End stage renal disease: Secondary | ICD-10-CM | POA: Diagnosis not present

## 2023-04-28 DIAGNOSIS — N2581 Secondary hyperparathyroidism of renal origin: Secondary | ICD-10-CM | POA: Diagnosis not present

## 2023-04-28 DIAGNOSIS — N186 End stage renal disease: Secondary | ICD-10-CM | POA: Diagnosis not present

## 2023-04-28 DIAGNOSIS — Z992 Dependence on renal dialysis: Secondary | ICD-10-CM | POA: Diagnosis not present

## 2023-05-01 DIAGNOSIS — N186 End stage renal disease: Secondary | ICD-10-CM | POA: Diagnosis not present

## 2023-05-01 DIAGNOSIS — Z992 Dependence on renal dialysis: Secondary | ICD-10-CM | POA: Diagnosis not present

## 2023-05-01 DIAGNOSIS — N2581 Secondary hyperparathyroidism of renal origin: Secondary | ICD-10-CM | POA: Diagnosis not present

## 2023-05-02 DIAGNOSIS — N186 End stage renal disease: Secondary | ICD-10-CM | POA: Diagnosis not present

## 2023-05-02 DIAGNOSIS — Z992 Dependence on renal dialysis: Secondary | ICD-10-CM | POA: Diagnosis not present

## 2023-05-03 ENCOUNTER — Encounter (HOSPITAL_BASED_OUTPATIENT_CLINIC_OR_DEPARTMENT_OTHER): Payer: No Typology Code available for payment source | Attending: General Surgery | Admitting: General Surgery

## 2023-05-03 DIAGNOSIS — E11621 Type 2 diabetes mellitus with foot ulcer: Secondary | ICD-10-CM | POA: Diagnosis not present

## 2023-05-03 DIAGNOSIS — I120 Hypertensive chronic kidney disease with stage 5 chronic kidney disease or end stage renal disease: Secondary | ICD-10-CM | POA: Diagnosis not present

## 2023-05-03 DIAGNOSIS — M869 Osteomyelitis, unspecified: Secondary | ICD-10-CM | POA: Insufficient documentation

## 2023-05-03 DIAGNOSIS — D631 Anemia in chronic kidney disease: Secondary | ICD-10-CM | POA: Diagnosis not present

## 2023-05-03 DIAGNOSIS — E1122 Type 2 diabetes mellitus with diabetic chronic kidney disease: Secondary | ICD-10-CM | POA: Insufficient documentation

## 2023-05-03 DIAGNOSIS — N186 End stage renal disease: Secondary | ICD-10-CM | POA: Insufficient documentation

## 2023-05-03 DIAGNOSIS — L97514 Non-pressure chronic ulcer of other part of right foot with necrosis of bone: Secondary | ICD-10-CM | POA: Insufficient documentation

## 2023-05-03 DIAGNOSIS — E1142 Type 2 diabetes mellitus with diabetic polyneuropathy: Secondary | ICD-10-CM | POA: Diagnosis not present

## 2023-05-03 DIAGNOSIS — Z992 Dependence on renal dialysis: Secondary | ICD-10-CM | POA: Diagnosis not present

## 2023-05-03 DIAGNOSIS — L89899 Pressure ulcer of other site, unspecified stage: Secondary | ICD-10-CM | POA: Diagnosis not present

## 2023-05-03 DIAGNOSIS — E1169 Type 2 diabetes mellitus with other specified complication: Secondary | ICD-10-CM | POA: Diagnosis not present

## 2023-05-03 DIAGNOSIS — D509 Iron deficiency anemia, unspecified: Secondary | ICD-10-CM | POA: Diagnosis not present

## 2023-05-03 NOTE — Progress Notes (Signed)
ORISON, ACY ([id number]) [id number] Page 1 of 9 Visit Report for 05/03/2023 Chief Complaint Document Details Patient Name: Date of Service: [facility] [name], [name] 05/03/2023 1:30 PM Medical Record Number: [id number] Patient Account Number: [id number] Date of Birth/Sex: Treating RN: Jun 07, 1951 (72 y.o. M) Primary Care Provider: [name] Other Other Clinician: Referring Provider: Treating Provider/Extender: [name] Weeks in Treatment: 29 Information Obtained from: Patient Chief Complaint Patients presents for treatment of an open diabetic ulcer on the right great toe Electronic Signature(s) Signed: 05/03/2023 2:08:07 PM By: [name] MD FACS Entered By: [name] on 05/03/2023 14:08:07 -------------------------------------------------------------------------------- HPI Details Patient Name: Date of Service: [facility] [name], [name] 05/03/2023 1:30 PM Medical Record Number: [id number] Patient Account Number: [id number] Date of Birth/Sex: Treating RN: Apr 01, 1951 (72 y.o. M) Primary Care Provider: [name] Other Other Clinician: Referring Provider: Treating Provider/Extender: [name] Weeks in Treatment: 29 History of Present Illness HPI Description: ADMISSION 10/12/2022 This is a 72 year old type II diabetic (last hemoglobin A1c 7.7 in October 2023) with stage V chronic kidney disease and hypertension. He has been followed by podiatry for an ulcer on his right great toe. He has a history of prior toe and foot infections requiring amputation. In November 2023, he had an outpatient MRI that demonstrated osteomyelitis. He was admitted to the hospital for IV antibiotics and subsequently underwent irrigation and debridement of the toe with biopsy and placement of married 3 layer matrix. Cultures grew out MRSA and osteomyelitis was seen on pathology. He was followed by infectious disease and given a course of doxycycline and Augmentin.  He completed this course at the end of December. He has been evaluated by vascular surgery with formal ABIs as well as consultation. He does have adequate blood flow at least to the dorsalis pedis. He last saw podiatry on January 4 of this year where he underwent debridement. It was felt he would benefit from specialized wound care and he was referred to our center for further evaluation and management. On the dorsal aspect of his right great toe, there is a circular ulcer. There is a fair amount of undermining from callus and dry skin. The surface is a bit pale and fibrotic, but there is no obvious necrosis. 10/19/2022: He has been approved by his insurance to undergo hyperbaric oxygen therapy. EKG has been obtained and is normal. We are still waiting on his chest x-ray. The wound is clean with just a little bit of periwound callus and slough on the surface. Tendon is exposed. 10/26/2022: Unfortunately, the required co-pay from his insurance is cost prohibitive for him and he will not be able to receive hyperbaric oxygen therapy. Some moisture got under the dry skin around his wound and lifted it off but there is good epithelialized tissue beneath this. The exposed tendon has deteriorated. 11/03/2022: After some discussion with the financial office, it has become feasible for the patient to undergo hyperbaric oxygen therapy and he has received his first treatments this week. The wound is a little bit smaller and the tissue has a better color to it, but there is still extensive nonviable subcutaneous tissue and slough present. 11/09/2022: The wound measured a little bit smaller today. There is still slough and nonviable subcu tissue and tendon present. 11/16/2022: The wound continues to contract. There is still substantial depth to it, however, and bone remains exposed at the base. This is also a (early) 30-day update for HBO. 11/27/2022: Unfortunately, the wound has deteriorated. It appears that the tissue  has become macerated. There is a second ulcer on the lateral aspect of his great toe. The nail is lifting off of the bed and there is wet skin lifting off of the underlying tissue. The initial wound is smaller but still has a fair amount of depth to it. The patient does report quite a bit more drainage since his last visit. KAYSHAUN, EVETTS ([id number]) [id number] Page 2 of 9 12/20/2022: For scheduling reasons, somehow we have missed having a wound care visit in between his hyperbaric oxygen therapy treatments. The 2 openings of the wound have converged into 1 large site. There is necrotic tendon present at the lateral aspect of the great toe. The bone is visible, including the joint space. The area of the initial wound is flush with the surrounding skin. No malodor or purulent drainage. 12/28/2022: The wound is vastly improved this week. The 2 areas of the wound have separated due to tissue bridging and healing the space between. There is no more necrotic tissue visible, although the bone/joint space remains exposed. 01/04/2023: His wound continues to improve. The more medial aspect of the toe is nearly closed. Bone remains exposed laterally. He has been approved for Epicord. He is going to start dialysis next week. 01/11/2023: The medial aspect of the wound is closed completely. The lateral open portion of his wound has contracted and is shallower. There is still bone exposure. He has started dialysis. We are going to apply Epicord today. 01/19/2023: There is good granulation tissue starting to fill in his wound. Bone remains exposed, however. 01/26/2023: The wound is smaller again today. The granulation tissue is a bit hypertrophic. There is still an area of bone that remains exposed. 02/02/2023: The wound is about the same size but has better tissue coverage. There is a protruding point of bone present. His insurance has changed and we are needing to secure approval again for his  hyperbaric oxygen therapy treatments. 02/09/2023: The wound is quite a bit smaller today and there is robust granulation tissue on the surface. He has decided to discontinue his hyperbaric oxygen treatments due to the financial issues associated with traveling to the wound care center on a daily basis, as well as his dialysis sessions. 02/16/2023: The wound continues to contract. There is very nice granulation tissue present and no slough or other debris accumulation. 02/22/2023: The wound is quite a bit smaller today. There is still some depth to it and I can still probe bone. 03/02/2023: The wound has contracted further. There is granulation tissue filling in the base. There is still a narrow passage through which I can probe to bone. 03/09/2023: The wound is down to just a very small hole through which I can still probe to bone. The rest of the wound has epithelialized. 03/16/2023: No real change in the wound dimensions. Bone is still exposed at the base. The edges are rolling inward, suggesting the wound has stalled. 03/26/2023: The wound is about a millimeter in diameter. A skinny probe just barely fits through the opening but I still feel bone at the base. 04/02/2023: The wound is just a tiny bit larger today. Bone is still able to be probed at the base. 04/10/2023: The wound is unchanged. The bone remains exposed. We are awaiting insurance approval for [facility]. 04/18/2023: The wound is back down to being about a millimeter in diameter, with a skinny probe barely fitting through the opening. Bone is still directly underneath the surface. No word regarding insurance approval for [facility]. 05/03/2023:  The wound is stable in size, but there seems to be some tissue starting to close over the bone. Electronic Signature(s) Signed: 05/03/2023 2:08:54 PM By: [name] MD FACS Entered By: [name] on 05/03/2023 14:08:54 -------------------------------------------------------------------------------- Physical  Exam Details Patient Name: Date of Service: [facility] [name], [name] 05/03/2023 1:30 PM Medical Record Number: [id number] Patient Account Number: [id number] Date of Birth/Sex: Treating RN: 1951/02/26 (72 y.o. M) Primary Care Provider: [name] Other Other Clinician: Referring Provider: Treating Provider/Extender: [name] Weeks in Treatment: 29 Constitutional . . . . no acute distress. Respiratory Normal work of breathing on room air. Notes 05/03/2023: The wound is stable in size, but there seems to be some tissue starting to close over the bone. Electronic Signature(s) Signed: 05/03/2023 2:09:35 PM By: [name] MD FACS Entered By: [name] on 05/03/2023 14:09:35 Turski, [name] ([id number]) [id number] Page 3 of 9 -------------------------------------------------------------------------------- Physician Orders Details Patient Name: Date of Service: [name], [name] 05/03/2023 1:30 PM Medical Record Number: [id number] Patient Account Number: [id number] Date of Birth/Sex: Treating RN: 1951/04/16 (72 y.o. [name] Primary Care Provider: [name] Other Other Clinician: Referring Provider: Treating Provider/Extender: [name] Weeks in Treatment: 29 Verbal / Phone Orders: No Diagnosis Coding ICD-10 Coding Code Description L97.514 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.621 Type 2 diabetes mellitus with foot ulcer N18.6 End stage renal disease M86.9 Osteomyelitis, unspecified E11.42 Type 2 diabetes mellitus with diabetic polyneuropathy I10 Essential (primary) hypertension Follow-up Appointments ppointment in 2 weeks. - Dr. Lady Gary - room 3 Return A Anesthetic Wound #1 Right,Medial T Great oe (In clinic) Topical Lidocaine 4% applied to wound bed Bathing/ Shower/ Hygiene May shower and wash wound with soap and water. Off-Loading Wound #1 Right,Medial T  Great oe Other: - Diabetic shoe Wound Treatment Wound #1 - T Great oe Wound Laterality: Right, Medial Cleanser: Soap and Water 1 x Per Week/30 Days Discharge Instructions: May shower and wash wound with dial antibacterial soap and water prior to dressing change. Cleanser: Wound Cleanser 1 x Per Week/30 Days Discharge Instructions: Cleanse the wound with wound cleanser prior to applying a clean dressing using gauze sponges, not tissue or cotton balls. Topical: Gentamicin 1 x Per Week/30 Days Discharge Instructions: Apply with Mupirocin (Mix Mupirocin and Getamicin ointment) Topical: Mupirocin Ointment 1 x Per Week/30 Days Discharge Instructions: Apply Mupirocin (Bactroban) with Gentamicinas instructed Prim Dressing: Promogran Prisma Matrix, 4.34 (sq in) (silver collagen) 1 x Per Week/30 Days ary Discharge Instructions: Moisten collagen with saline or hydrogel Secondary Dressing: Woven Gauze Sponges 2x2 in 1 x Per Week/30 Days Discharge Instructions: Apply over primary dressing as directed. Secured With: Insurance underwriter, Sterile 2x75 (in/in) 1 x Per Week/30 Days Discharge Instructions: Secure with stretch gauze as directed. Secured With: 59M [employer] Scientist, research (life sciences) Surgical T 2x10 (in/yd) 1 x Per Week/30 Days ape Discharge Instructions: Secure with tape as directed. Patient Medications llergies: losartan potassium, sildenafil A Notifications Medication Indication Start End JARET, BEHN ([id number]) [id number] Page 4 of 9 05/03/2023 lidocaine DOSE topical 4 % cream - cream topical Electronic Signature(s) Signed: 05/03/2023 2:16:35 PM By: [name] MD FACS Entered By: [name] on 05/03/2023 14:09:56 -------------------------------------------------------------------------------- Problem List Details Patient Name: Date of Service: [facility] [name], [name] 05/03/2023 1:30 PM Medical Record Number: [id number] Patient Account Number:  [id number] Date of Birth/Sex: Treating RN: 1951-05-29 (72 y.o. M) Primary Care Provider: [name] Other Other Clinician: Referring Provider: Treating Provider/Extender: Zara Chess,  Jayce Weeks in Treatment: 29 Active Problems ICD-10 Encounter Code Description Active Date MDM Diagnosis L97.514 Non-pressure chronic ulcer of other part of right foot with necrosis of bone 10/12/2022 No Yes E11.621 Type 2 diabetes mellitus with foot ulcer 10/12/2022 No Yes N18.6 End stage renal disease 10/12/2022 No Yes M86.9 Osteomyelitis, unspecified 10/12/2022 No Yes E11.42 Type 2 diabetes mellitus with diabetic polyneuropathy 10/12/2022 No Yes I10 Essential (primary) hypertension 10/12/2022 No Yes Inactive Problems Resolved Problems Electronic Signature(s) Signed: 05/03/2023 2:06:42 PM By: [name] MD FACS Entered By: [name] on 05/03/2023 14:06:41 Progress Note Details -------------------------------------------------------------------------------- Glorious Peach ([id number]) [id number] Page 5 of 9 Patient Name: Date of ServiceDorise [place], [name] 05/03/2023 1:30 PM Medical Record Number: [id number] Patient Account Number: [id number] Date of Birth/Sex: Treating RN: Aug 21, 1951 (72 y.o. M) Primary Care Provider: [name] Other Other Clinician: Referring Provider: Treating Provider/Extender: [name] Weeks in Treatment: 29 Subjective Chief Complaint Information obtained from Patient Patients presents for treatment of an open diabetic ulcer on the right great toe History of Present Illness (HPI) ADMISSION 10/12/2022 This is a 72 year old type II diabetic (last hemoglobin A1c 7.7 in October 2023) with stage V chronic kidney disease and hypertension. He has been followed by podiatry for an ulcer on his right great toe. He has a history of prior toe and foot infections requiring amputation. In November 2023, he had an outpatient MRI that  demonstrated osteomyelitis. He was admitted to the hospital for IV antibiotics and subsequently underwent irrigation and debridement of the toe with biopsy and placement of married 3 layer matrix. Cultures grew out MRSA and osteomyelitis was seen on pathology. He was followed by infectious disease and given a course of doxycycline and Augmentin. He completed this course at the end of December. He has been evaluated by vascular surgery with formal ABIs as well as consultation. He does have adequate blood flow at least to the dorsalis pedis. He last saw podiatry on January 4 of this year where he underwent debridement. It was felt he would benefit from specialized wound care and he was referred to our center for further evaluation and management. On the dorsal aspect of his right great toe, there is a circular ulcer. There is a fair amount of undermining from callus and dry skin. The surface is a bit pale and fibrotic, but there is no obvious necrosis. 10/19/2022: He has been approved by his insurance to undergo hyperbaric oxygen therapy. EKG has been obtained and is normal. We are still waiting on his chest x-ray. The wound is clean with just a little bit of periwound callus and slough on the surface. Tendon is exposed. 10/26/2022: Unfortunately, the required co-pay from his insurance is cost prohibitive for him and he will not be able to receive hyperbaric oxygen therapy. Some moisture got under the dry skin around his wound and lifted it off but there is good epithelialized tissue beneath this. The exposed tendon has deteriorated. 11/03/2022: After some discussion with the financial office, it has become feasible for the patient to undergo hyperbaric oxygen therapy and he has received his first treatments this week. The wound is a little bit smaller and the tissue has a better color to it, but there is still extensive nonviable subcutaneous tissue and slough present. 11/09/2022: The wound measured a little  bit smaller today. There is still slough and nonviable subcu tissue and tendon present. 11/16/2022: The wound continues to contract. There is still substantial depth to it, however, and bone remains  exposed at the base. This is also a (early) 30-day update for HBO. 11/27/2022: Unfortunately, the wound has deteriorated. It appears that the tissue has become macerated. There is a second ulcer on the lateral aspect of his great toe. The nail is lifting off of the bed and there is wet skin lifting off of the underlying tissue. The initial wound is smaller but still has a fair amount of depth to it. The patient does report quite a bit more drainage since his last visit. 12/20/2022: For scheduling reasons, somehow we have missed having a wound care visit in between his hyperbaric oxygen therapy treatments. The 2 openings of the wound have converged into 1 large site. There is necrotic tendon present at the lateral aspect of the great toe. The bone is visible, including the joint space. The area of the initial wound is flush with the surrounding skin. No malodor or purulent drainage. 12/28/2022: The wound is vastly improved this week. The 2 areas of the wound have separated due to tissue bridging and healing the space between. There is no more necrotic tissue visible, although the bone/joint space remains exposed. 01/04/2023: His wound continues to improve. The more medial aspect of the toe is nearly closed. Bone remains exposed laterally. He has been approved for Epicord. He is going to start dialysis next week. 01/11/2023: The medial aspect of the wound is closed completely. The lateral open portion of his wound has contracted and is shallower. There is still bone exposure. He has started dialysis. We are going to apply Epicord today. 01/19/2023: There is good granulation tissue starting to fill in his wound. Bone remains exposed, however. 01/26/2023: The wound is smaller again today. The granulation tissue is a  bit hypertrophic. There is still an area of bone that remains exposed. 02/02/2023: The wound is about the same size but has better tissue coverage. There is a protruding point of bone present. His insurance has changed and we are needing to secure approval again for his hyperbaric oxygen therapy treatments. 02/09/2023: The wound is quite a bit smaller today and there is robust granulation tissue on the surface. He has decided to discontinue his hyperbaric oxygen treatments due to the financial issues associated with traveling to the wound care center on a daily basis, as well as his dialysis sessions. 02/16/2023: The wound continues to contract. There is very nice granulation tissue present and no slough or other debris accumulation. 02/22/2023: The wound is quite a bit smaller today. There is still some depth to it and I can still probe bone. 03/02/2023: The wound has contracted further. There is granulation tissue filling in the base. There is still a narrow passage through which I can probe to bone. 03/09/2023: The wound is down to just a very small hole through which I can still probe to bone. The rest of the wound has epithelialized. 03/16/2023: No real change in the wound dimensions. Bone is still exposed at the base. The edges are rolling inward, suggesting the wound has stalled. 03/26/2023: The wound is about a millimeter in diameter. A skinny probe just barely fits through the opening but I still feel bone at the base. 04/02/2023: The wound is just a tiny bit larger today. Bone is still able to be probed at the base. 04/10/2023: The wound is unchanged. The bone remains exposed. We are awaiting insurance approval for [facility]. 04/18/2023: The wound is back down to being about a millimeter in diameter, with a skinny probe barely fitting through the  opening. Bone is still directly underneath the surface. No word regarding insurance approval for [facility]. 05/03/2023: The wound is stable in size, but there seems to be  some tissue starting to close over the bone. DESIREE, ESTABROOKS ([id number]) [id number] Page 6 of 9 Patient History Information obtained from Patient. Family History Diabetes - Mother,Siblings, Heart Disease - Siblings, Hypertension - Siblings, Seizures - Father, Stroke - Siblings, No family history of Cancer, Hereditary Spherocytosis, Kidney Disease, Lung Disease, Thyroid Problems, Tuberculosis. Social History Former smoker, Marital Status - Married, Alcohol Use - Rarely, Drug Use - No History, Caffeine Use - Daily. Medical History Eyes Patient has history of Cataracts - rt eye Ear/Nose/Mouth/Throat Denies history of Chronic sinus problems/congestion Hematologic/Lymphatic Denies history of Anemia, Hemophilia, Human Immunodeficiency Virus, Lymphedema, Sickle Cell Disease Respiratory Denies history of Aspiration, Asthma, Chronic Obstructive Pulmonary Disease (COPD), Pneumothorax, Tuberculosis Cardiovascular Patient has history of Hypertension Gastrointestinal Denies history of Cirrhosis , Colitis, Crohns, Hepatitis A, Hepatitis B, Hepatitis C Endocrine Patient has history of Type II Diabetes Genitourinary Denies history of End Stage Renal Disease Immunological Denies history of Lupus Erythematosus, Raynauds, Scleroderma Integumentary (Skin) Denies history of History of Burn Musculoskeletal Patient has history of Osteomyelitis - MRI confirmed (08/16/22) Denies history of Gout Neurologic Patient has history of Neuropathy Psychiatric Patient has history of Confinement Anxiety Denies history of Anorexia/bulimia Hospitalization/Surgery History - AV fistula place left upper arm 10/20/22. Medical A Surgical History Notes nd Genitourinary CKD IV Objective Constitutional no acute distress. Vitals Time Taken: 1:36 PM, Height: 72 in, Weight: 185 lbs, BMI: 25.1, Temperature: 97.9 F, Pulse: 98 bpm, Respiratory Rate: 18 breaths/min, Blood Pressure: 130/68  mmHg. Respiratory Normal work of breathing on room air. General Notes: 05/03/2023: The wound is stable in size, but there seems to be some tissue starting to close over the bone. Integumentary (Hair, Skin) Wound #1 status is Open. Original cause of wound was Pressure Injury. The date acquired was: 08/16/2022. The wound has been in treatment 29 weeks. The wound is located on the Right,Medial T Great. The wound measures 0.2cm length x 0.2cm width x 0.2cm depth; 0.031cm^2 area and 0.006cm^3 volume. oe There is bone and Fat Layer (Subcutaneous Tissue) exposed. There is no tunneling or undermining noted. There is a small amount of serosanguineous drainage noted. The wound margin is distinct with the outline attached to the wound base. There is large (67-100%) red granulation within the wound bed. There is a small (1-33%) amount of necrotic tissue within the wound bed including Adherent Slough. The periwound skin appearance had no abnormalities noted for moisture. The periwound skin appearance had no abnormalities noted for color. The periwound skin appearance exhibited: Callus. Periwound temperature was noted as No Abnormality. Assessment Active Problems ICD-10 KIEREN, GORDIN ([id number]) [id number] Page 7 of 9 Non-pressure chronic ulcer of other part of right foot with necrosis of bone Type 2 diabetes mellitus with foot ulcer End stage renal disease Osteomyelitis, unspecified Type 2 diabetes mellitus with diabetic polyneuropathy Essential (primary) hypertension Plan Follow-up Appointments: Return Appointment in 2 weeks. - Dr. Lady Gary - room 3 Anesthetic: Wound #1 Right,Medial T Great: oe (In clinic) Topical Lidocaine 4% applied to wound bed Bathing/ Shower/ Hygiene: May shower and wash wound with soap and water. Off-Loading: Wound #1 Right,Medial T Great: oe Other: - Diabetic shoe The following medication(s) was prescribed: lidocaine topical 4 % cream cream  topical was prescribed at facility WOUND #1: - T Great Wound Laterality: Right, Medial oe Cleanser: Soap and Water 1 x Per Week/30 Days  Discharge Instructions: May shower and wash wound with dial antibacterial soap and water prior to dressing change. Cleanser: Wound Cleanser 1 x Per Week/30 Days Discharge Instructions: Cleanse the wound with wound cleanser prior to applying a clean dressing using gauze sponges, not tissue or cotton balls. Topical: Gentamicin 1 x Per Week/30 Days Discharge Instructions: Apply with Mupirocin (Mix Mupirocin and Getamicin ointment) Topical: Mupirocin Ointment 1 x Per Week/30 Days Discharge Instructions: Apply Mupirocin (Bactroban) with Gentamicinas instructed Prim Dressing: Promogran Prisma Matrix, 4.34 (sq in) (silver collagen) 1 x Per Week/30 Days ary Discharge Instructions: Moisten collagen with saline or hydrogel Secondary Dressing: Woven Gauze Sponges 2x2 in 1 x Per Week/30 Days Discharge Instructions: Apply over primary dressing as directed. Secured With: Insurance underwriter, Sterile 2x75 (in/in) 1 x Per Week/30 Days Discharge Instructions: Secure with stretch gauze as directed. Secured With: 91M Medipore Scientist, research (life sciences) Surgical T 2x10 (in/yd) 1 x Per Week/30 Days ape Discharge Instructions: Secure with tape as directed. 05/03/2023: The wound is stable in size, but there seems to be some tissue starting to close over the bone. No debridement was necessary today. We will continue to apply gentamicin and mupirocin. I think we can probably get some Prisma silver collagen down into the sites to try and encourage more tissue growth to fill in the bone, as well. He will follow-up in 2 weeks. Electronic Signature(s) Signed: 05/03/2023 2:11:17 PM By: [name] MD FACS Entered By: [name] on 05/03/2023 14:11:17 -------------------------------------------------------------------------------- HxROS Details Patient Name: Date of Service: THO  [name], [name] 05/03/2023 1:30 PM Medical Record Number: [id number] Patient Account Number: [id number] Date of Birth/Sex: Treating RN: [birth year] (72 y.o. M) Primary Care Provider: [name] Other Other Clinician: Referring Provider: Treating Provider/Extender: [name] Weeks in Treatment: 29 Information Obtained From Patient Eyes Medical History: Positive for: Cataracts - rt eye Arciniega, Louis ([id number]) [id number] Page 8 of 9 Ear/Nose/Mouth/Throat Medical History: Negative for: Chronic sinus problems/congestion Hematologic/Lymphatic Medical History: Negative for: Anemia; Hemophilia; Human Immunodeficiency Virus; Lymphedema; Sickle Cell Disease Respiratory Medical History: Negative for: Aspiration; Asthma; Chronic Obstructive Pulmonary Disease (COPD); Pneumothorax; Tuberculosis Cardiovascular Medical History: Positive for: Hypertension Gastrointestinal Medical History: Negative for: Cirrhosis ; Colitis; Crohns; Hepatitis A; Hepatitis B; Hepatitis C Endocrine Medical History: Positive for: Type II Diabetes Time with diabetes: 2000 Treated with: Insulin Blood sugar tested every day: Yes Tested : Genitourinary Medical History: Negative for: End Stage Renal Disease Past Medical History Notes: CKD IV Immunological Medical History: Negative for: Lupus Erythematosus; Raynauds; Scleroderma Integumentary (Skin) Medical History: Negative for: History of Burn Musculoskeletal Medical History: Positive for: Osteomyelitis - MRI confirmed (08/16/22) Negative for: Gout Neurologic Medical History: Positive for: Neuropathy Psychiatric Medical History: Positive for: Confinement Anxiety Negative for: Anorexia/bulimia HBO Extended History Items Eyes: Cataracts Immunizations Pneumococcal Vaccine: Received Pneumococcal Vaccination: Yes Received Pneumococcal Vaccination On or After 60th Birthday: Yes Implantable Devices [place], [name]  ([id number]) [id number] Page 9 of 9 None Hospitalization / Surgery History Type of Hospitalization/Surgery AV fistula place left upper arm 10/20/22 Family and Social History Cancer: No; Diabetes: Yes - Mother,Siblings; Heart Disease: Yes - Siblings; Hereditary Spherocytosis: No; Hypertension: Yes - Siblings; Kidney Disease: No; Lung Disease: No; Seizures: Yes - Father; Stroke: Yes - Siblings; Thyroid Problems: No; Tuberculosis: No; Former smoker; Marital Status - Married; Alcohol Use: Rarely; Drug Use: No History; Caffeine Use: Daily; Financial Concerns: No; Food, Clothing or Shelter Needs: No; Support System Lacking: No; Transportation Concerns: No Psychologist, prison and probation services) Signed: 05/03/2023 2:16:35 PM By: [name] MD  FACS Entered By: [name] on 05/03/2023 14:09:02 -------------------------------------------------------------------------------- SuperBill Details Patient Name: Date of Service: [facility] [name], [name] 05/03/2023 Medical Record Number: [id number] Patient Account Number: [id number] Date of Birth/Sex: Treating RN: 1951-07-06 (72 y.o. [name] Primary Care Provider: [name] Other Other Clinician: Referring Provider: Treating Provider/Extender: [name] Weeks in Treatment: 29 Diagnosis Coding ICD-10 Codes Code Description L97.514 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.621 Type 2 diabetes mellitus with foot ulcer N18.6 End stage renal disease M86.9 Osteomyelitis, unspecified E11.42 Type 2 diabetes mellitus with diabetic polyneuropathy I10 Essential (primary) hypertension Facility Procedures : CPT4 Code: 29528413 Description: 99213 - WOUND CARE VISIT-LEV 3 EST PT Modifier: Quantity: 1 Physician Procedures : CPT4 Code Description Modifier [id number] 99214 - WC PHYS LEVEL 4 - EST PT ICD-10 Diagnosis Description L97.514 Non-pressure chronic ulcer of other part of right  foot with necrosis of bone E11.621 Type 2 diabetes mellitus with foot ulcer M86.9  Osteomyelitis, unspecified N18.6 End stage renal disease Quantity: 1 Electronic Signature(s) Signed: 05/03/2023 2:11:42 PM By: [name] MD FACS Entered By: [name] on 05/03/2023 14:11:41

## 2023-05-03 NOTE — Progress Notes (Signed)
McKinney, Shaan ([id number]) [id number] Page 1 of 9 Visit Report for 05/03/2023 Arrival Information Details Patient Name: Date of Service: [name], [name] 05/03/2023 1:30 PM Medical Record Number: [id number] Patient Account Number: [id number] Date of Birth/Sex: Treating RN: [birth year] (72 y.o. [name] Primary Care [name]: [name] Other Other Clinician: Referring [name]: Treating [name]/Extender: [name] [name]: 29 Visit Information History Since Last Visit Added or deleted any medications: No Patient Arrived: [name] Any new allergies or adverse reactions: No Arrival Time: 13:34 Had a fall or experienced change in No Accompanied By: family activities of daily living that may affect Transfer Assistance: None risk of falls: Patient Identification Verified: Yes Signs or symptoms of abuse/neglect since last visito No Secondary Verification Process Completed: Yes Hospitalized since last visit: No Patient Requires Transmission-Based Precautions: No Implantable device outside of the clinic excluding No Patient Has Alerts: No cellular tissue based products placed in the center since last visit: Has Dressing in Place as Prescribed: Yes Pain Present Now: No Electronic Signature(s) Signed: 05/03/2023 3:38:42 PM By: [name] Entered By: [name] on 05/03/2023 13:34:43 -------------------------------------------------------------------------------- Clinic Level of Care Assessment Details Patient Name: Date of ServiceDorise [name], [name] 05/03/2023 1:30 PM Medical Record Number: [id number] Patient Account Number: [id number] Date of Birth/Sex: Treating RN: 03-May-1951 (72 y.o. [name] Primary Care Selby Foisy: [name] Other Other Clinician: Referring [name]: Treating Joden Bonsall/Extender: [name] [name]: 29 Clinic Level of Care Assessment Items TOOL 4 Quantity  Score X- 1 0 Use when only an EandM is performed on FOLLOW-UP visit ASSESSMENTS - Nursing Assessment / Reassessment []  - 0 Reassessment of Co-morbidities (includes updates in patient status) X- 1 5 Reassessment of Adherence to [name] Plan ASSESSMENTS - Wound and Skin A ssessment / Reassessment X - Simple Wound Assessment / Reassessment - one wound 1 5 []  - 0 Complex Wound Assessment / Reassessment - multiple wounds []  - 0 Dermatologic / Skin Assessment (not related to wound area) ASSESSMENTS - Focused Assessment []  - 0 Circumferential Edema Measurements - multi extremities []  - 0 Nutritional Assessment / Counseling / Intervention [name], [name] ([id number]) [phone].pdf Page 2 of 9 []  - 0 Lower Extremity Assessment (monofilament, tuning fork, pulses) []  - 0 Peripheral Arterial Disease Assessment (using hand held doppler) ASSESSMENTS - Ostomy and/or Continence Assessment and Care []  - 0 Incontinence Assessment and Management []  - 0 Ostomy Care Assessment and Management (repouching, etc.) PROCESS - Coordination of Care X - Simple Patient / Family Education for ongoing care 1 15 []  - 0 Complex (extensive) Patient / Family Education for ongoing care X- 1 10 Staff obtains Chiropractor, Records, T Results / Process Orders est []  - 0 Staff telephones HHA, Nursing Homes / Clarify orders / etc []  - 0 Routine Transfer to another Facility (non-emergent condition) []  - 0 Routine Hospital Admission (non-emergent condition) []  - 0 New Admissions / Manufacturing engineer / Ordering NPWT Apligraf, etc. , []  - 0 Emergency Hospital Admission (emergent condition) X- 1 10 Simple Discharge Coordination []  - 0 Complex (extensive) Discharge Coordination PROCESS - Special Needs []  - 0 Pediatric / Minor Patient Management []  - 0 Isolation Patient Management []  - 0 Hearing / Language / Visual special needs []  - 0 Assessment of Community assistance  (transportation, D/C planning, etc.) []  - 0 Additional assistance / Altered mentation []  - 0 Support Surface(s) Assessment (bed, cushion, seat, etc.) INTERVENTIONS - Wound Cleansing / Measurement X - Simple Wound Cleansing - one wound 1  5 []  - 0 Complex Wound Cleansing - multiple wounds X- 1 5 Wound Imaging (photographs - any number of wounds) []  - 0 Wound Tracing (instead of photographs) X- 1 5 Simple Wound Measurement - one wound []  - 0 Complex Wound Measurement - multiple wounds INTERVENTIONS - Wound Dressings X - Small Wound Dressing one or multiple wounds 1 10 []  - 0 Medium Wound Dressing one or multiple wounds []  - 0 Large Wound Dressing one or multiple wounds X- 1 5 Application of Medications - topical []  - 0 Application of Medications - injection INTERVENTIONS - Miscellaneous []  - 0 External ear exam []  - 0 Specimen Collection (cultures, biopsies, blood, body fluids, etc.) []  - 0 Specimen(s) / Culture(s) sent or taken to Lab for analysis []  - 0 Patient Transfer (multiple staff / Nurse, adult / Similar devices) []  - 0 Simple Staple / Suture removal (25 or less) []  - 0 Complex Staple / Suture removal (26 or more) []  - 0 Hypo / Hyperglycemic Management (close monitor of Blood Glucose) [name], [name] ([id number]) [id number] Page 3 of 9 []  - 0 Ankle / Brachial Index (ABI) - do not check if billed separately X- 1 5 Vital Signs Has the patient been seen at the hospital within the last three years: Yes Total Score: 80 Level Of Care: New/Established - Level 3 Electronic Signature(s) Signed: 05/03/2023 3:38:42 PM By: [name] Entered By: [name] on 05/03/2023 13:54:41 -------------------------------------------------------------------------------- Encounter Discharge Information Details Patient Name: Date of Service: [name], [name] 05/03/2023 1:30 PM Medical Record Number: [id number] Patient Account Number:  [id number] Date of Birth/Sex: Treating RN: September 14, 1951 (72 y.o. [name] Primary Care Tichina Koebel: [name] Other Other Clinician: Referring Yanai Hobson: Treating Echo Allsbrook/Extender: [name] [name]: 29 Encounter Discharge Information Items Discharge Condition: Stable Ambulatory Status: Cane Discharge Destination: Home Transportation: Private Auto Accompanied By: family Schedule Follow-up Appointment: Yes Clinical Summary of Care: Patient Declined Electronic Signature(s) Signed: 05/03/2023 3:38:42 PM By: [name] Entered By: [name] on 05/03/2023 13:55:09 -------------------------------------------------------------------------------- Lower Extremity Assessment Details Patient Name: Date of ServiceDorise [name], [name] 05/03/2023 1:30 PM Medical Record Number: [id number] Patient Account Number: [id number] Date of Birth/Sex: Treating RN: 30-Jan-1951 (72 y.o. [name] Primary Care Erhard Senske: [name] Other Other Clinician: Referring Shatarra Wehling: Treating Jenilyn Magana/Extender: [name] [name]: 29 Edema Assessment Assessed: [Left: No] [Right: No] Edema: [Left: N] [Right: o] Calf Left: Right: Point of Measurement: From Medial Instep 36 cm Ankle Left: Right: Point of Measurement: From Medial Instep 23 cm Vascular Assessment Tiberio, Keagon ([id number]) [Right:[id number] Page 4 of 9] Extremity colors, hair growth, and conditions: Hair Growth on Extremity: [Right:Yes] Temperature of Extremity: [Right:Warm] Capillary Refill: [Right:< 3 seconds] Dependent Rubor: [Right:No No] Electronic Signature(s) Signed: 05/03/2023 3:38:42 PM By: [name] Entered By: [name] on 05/03/2023 13:33:00 -------------------------------------------------------------------------------- Multi Wound Chart Details Patient Name: Date of Service: [facility], [name] 05/03/2023 1:30  PM Medical Record Number: [id number] Patient Account Number: [id number] Date of Birth/Sex: Treating RN: [birth year] (72 y.o. M) Primary Care Broly Hatfield: [name] Other Other Clinician: Referring Aundrea Higginbotham: Treating Baker Moronta/Extender: [name] [name]: 29 Vital Signs Height(in): 72 Pulse(bpm): 98 Weight(lbs): 185 Blood Pressure(mmHg): 130/68 Body Mass Index(BMI): 25.1 Temperature(F): 97.9 Respiratory Rate(breaths/min): 18 [1:Photos:] [N/A:N/A] Right, Medial T Great oe N/A N/A Wound Location: Pressure Injury N/A N/A Wounding Event: Diabetic Wound/Ulcer of the Lower N/A N/A Primary Etiology: Extremity Acute Osteomyelitis N/A N/A Secondary Etiology: Cataracts, Hypertension, Type II N/A N/A Comorbid  History: Diabetes, Osteomyelitis, Neuropathy, Confinement Anxiety 08/16/2022 N/A N/A Date Acquired: 65 N/A N/A [name] of [name]: Open N/A N/A Wound Status: No N/A N/A Wound Recurrence: 0.2x0.2x0.2 N/A N/A Measurements L x W x D (cm) 0.031 N/A N/A A (cm) : rea 0.006 N/A N/A Volume (cm) : 88.70% N/A N/A % Reduction in A rea: 94.50% N/A N/A % Reduction in Volume: Grade 3 N/A N/A Classification: Small N/A N/A Exudate A mount: Serosanguineous N/A N/A Exudate Type: red, brown N/A N/A Exudate Color: Distinct, outline attached N/A N/A Wound Margin: Large (67-100%) N/A N/A Granulation A mount: Red N/A N/A Granulation Quality: Small (1-33%) N/A N/A Necrotic A mount: Fat Layer (Subcutaneous Tissue): Yes N/A N/A Exposed Structures: Bone: Yes Fascia: No Tendon: No Muscle: No Joint: No Medium (34-66%) N/A N/A Epithelialization: [name], [name] ([id number]) [id number] Page 5 of 9 Callus: Yes N/A N/A Periwound Skin Texture: Maceration: Yes N/A N/A Periwound Skin Moisture: No Abnormalities Noted N/A N/A Periwound Skin Color: No Abnormality N/A N/A Temperature: [name] Notes Wound #1 (Toe Great) Wound  Laterality: Right, Medial Cleanser Soap and Water Discharge Instruction: May shower and wash wound with dial antibacterial soap and water prior to dressing change. Wound Cleanser Discharge Instruction: Cleanse the wound with wound cleanser prior to applying a clean dressing using gauze sponges, not tissue or cotton balls. Peri-Wound Care Topical Gentamicin Discharge Instruction: Apply with Mupirocin (Mix Mupirocin and Getamicin ointment) Mupirocin Ointment Discharge Instruction: Apply Mupirocin (Bactroban) with Gentamicinas instructed Primary Dressing Promogran Prisma Matrix, 4.34 (sq in) (silver collagen) Discharge Instruction: Moisten collagen with saline or hydrogel Secondary Dressing Woven Gauze Sponges 2x2 in Discharge Instruction: Apply over primary dressing as directed. Secured With Conforming Stretch Gauze Bandage, Sterile 2x75 (in/in) Discharge Instruction: Secure with stretch gauze as directed. 70M Medipore Soft Cloth Surgical T 2x10 (in/yd) ape Discharge Instruction: Secure with tape as directed. Compression Wrap Compression Stockings Add-Ons Electronic Signature(s) Signed: 05/03/2023 2:08:01 PM By: [name] MD FACS Entered By: [name] on 05/03/2023 14:08:01 -------------------------------------------------------------------------------- Multi-Disciplinary Care Plan Details Patient Name: Date of Service: [facility] [name], [name] 05/03/2023 1:30 PM Medical Record Number: [id number] Patient Account Number: [id number] Date of Birth/Sex: Treating RN: 07-07-1951 (72 y.o. [name] Primary Care Kou Gucciardo: [name] Other Other Clinician: Referring Christasia Angeletti: Treating Maureena Dabbs/Extender: [name] [name]: 29 Multidisciplinary Care Plan reviewed with physician Active Inactive Nutrition Nursing Diagnoses: Impaired glucose control: actual or potential [name], [name] ([id number]) [phone].pdf Page 6 of  9 Goals: Patient/caregiver will maintain therapeutic glucose control Date Initiated: 10/12/2022 Target Resolution Date: 06/02/2023 Goal Status: Active Interventions: Provide education on elevated blood sugars and impact on wound healing Provide education on nutrition [name] Activities: Dietary management education, guidance and counseling : 10/12/2022 Giving encouragement to exercise : 10/12/2022 Notes: Osteomyelitis Nursing Diagnoses: Infection: osteomyelitis Goals: Patient/caregiver will verbalize understanding of disease process and disease management Date Initiated: 10/12/2022 Target Resolution Date: 06/02/2023 Goal Status: Active Interventions: Assess for signs and symptoms of osteomyelitis resolution every visit Provide education on osteomyelitis [name] Activities: MRI : 08/16/2022 Notes: Electronic Signature(s) Signed: 05/03/2023 3:38:42 PM By: [name] Entered By: [name] on 05/03/2023 13:33:12 -------------------------------------------------------------------------------- Pain Assessment Details Patient Name: Date of Service: [facility] [name], [name] 05/03/2023 1:30 PM Medical Record Number: [id number] Patient Account Number: [id number] Date of Birth/Sex: Treating RN: [birth year] (72 y.o. [name] Primary Care Ami Mally: [name] Other Other Clinician: Referring Rosalyn Archambault: Treating Johany Hansman/Extender: [name] [name]: 29 Active Problems Location of Pain Severity and Description of Pain Patient  Has Paino No Site Locations Rate the pain. [name], [name] ([id number]) [id number] Page 7 of 9 Rate the pain. Current Pain Level: 0 Pain Management and Medication Current Pain Management: Electronic Signature(s) Signed: 05/03/2023 3:38:42 PM By: [name] Entered By: [name] on 05/03/2023  13:34:50 -------------------------------------------------------------------------------- Patient/Caregiver Education Details Patient Name: Date of Service: [name], [name] 8/1/2024andnbsp1:30 PM Medical Record Number: [id number] Patient Account Number: [id number] Date of Birth/Gender: Treating RN: 11-Jun-1951 (72 y.o. [name] Primary Care Physician: [name] Other Other Clinician: Referring Physician: Treating Physician/Extender: Gracy Racer in [name]: 29 Education Assessment Education Provided To: Patient Education Topics Provided Safety: Methods: Explain/Verbal Responses: Reinforcements needed, State content correctly Electronic Signature(s) Signed: 05/03/2023 3:38:42 PM By: [name] Entered By: [name] on 05/03/2023 13:33:27 -------------------------------------------------------------------------------- Wound Assessment Details Patient Name: Date of Service: [facility] [name], [name] 05/03/2023 1:30 PM Medical Record Number: [id number] Patient Account Number: [id number] Date of Birth/Sex: Treating RN: [birth year] (72 y.o. [name] Primary Care Mekesha Solomon: [name] Other Other Clinician: Referring Abbagayle Zaragoza: Treating Mahesh Sizemore/Extender: [name] [name], [name] ([id number]) [id number] Page 8 of 9 [name]: 29 Wound Status Wound Number: 1 Primary Diabetic Wound/Ulcer of the Lower Extremity Etiology: Wound Location: Right, Medial T Great oe Secondary Acute Osteomyelitis Wounding Event: Pressure Injury Etiology: Date Acquired: 08/16/2022 Wound Open [name] Of [name]: 29 Status: Clustered Wound: No Comorbid Cataracts, Hypertension, Type II Diabetes, Osteomyelitis, History: Neuropathy, Confinement Anxiety Photos Wound Measurements Length: (cm) 0.2 Width: (cm) 0.2 Depth: (cm) 0.2 Area: (cm) 0.031 Volume: (cm) 0.006 % Reduction in Area: 88.7% % Reduction in  Volume: 94.5% Epithelialization: Medium (34-66%) Tunneling: No Undermining: No Wound Description Classification: Grade 3 Wound Margin: Distinct, outline attached Exudate Amount: Small Exudate Type: Serosanguineous Exudate Color: red, brown Foul Odor After Cleansing: No Slough/Fibrino Yes Wound Bed Granulation Amount: Large (67-100%) Exposed Structure Granulation Quality: Red Fascia Exposed: No Necrotic Amount: Small (1-33%) Fat Layer (Subcutaneous Tissue) Exposed: Yes Necrotic Quality: Adherent Slough Tendon Exposed: No Muscle Exposed: No Joint Exposed: No Bone Exposed: Yes Periwound Skin Texture Texture Color No Abnormalities Noted: No No Abnormalities Noted: Yes Callus: Yes Temperature / Pain Temperature: No Abnormality Moisture No Abnormalities Noted: Yes [name] Notes Wound #1 (Toe Great) Wound Laterality: Right, Medial Cleanser Soap and Water Discharge Instruction: May shower and wash wound with dial antibacterial soap and water prior to dressing change. Wound Cleanser Discharge Instruction: Cleanse the wound with wound cleanser prior to applying a clean dressing using gauze sponges, not tissue or cotton balls. Peri-Wound Care Topical Gentamicin Discharge Instruction: Apply with Mupirocin (Mix Mupirocin and Getamicin ointment) Mupirocin Ointment Discharge Instruction: Apply Mupirocin (Bactroban) with Gentamicinas instructed [name], [name] ([id number]) [phone].pdf Page 9 of 9 Primary Dressing Promogran Prisma Matrix, 4.34 (sq in) (silver collagen) Discharge Instruction: Moisten collagen with saline or hydrogel Secondary Dressing Woven Gauze Sponges 2x2 in Discharge Instruction: Apply over primary dressing as directed. Secured With Conforming Stretch Gauze Bandage, Sterile 2x75 (in/in) Discharge Instruction: Secure with stretch gauze as directed. 66M Medipore Soft Cloth Surgical T 2x10 (in/yd) ape Discharge Instruction: Secure with  tape as directed. Compression Wrap Compression Stockings Add-Ons Electronic Signature(s) Signed: 05/03/2023 3:38:42 PM By: [name] Entered By: [name] on 05/03/2023 13:47:21 -------------------------------------------------------------------------------- Vitals Details Patient Name: Date of Service: [facility] [name], [name] 05/03/2023 1:30 PM Medical Record Number: [id number] Patient Account Number: [id number] Date of Birth/Sex: Treating RN: 09/19/1951 (72 y.o. [name] Primary Care Kora Groom: [name] Other Other Clinician: Referring Rocky Rishel: Treating Namrata Dangler/Extender: Zara Chess,  Jayce [name]: 29 Vital Signs Time Taken: 13:36 Temperature (F): 97.9 Height (in): 72 Pulse (bpm): 98 Weight (lbs): 185 Respiratory Rate (breaths/min): 18 Body Mass Index (BMI): 25.1 Blood Pressure (mmHg): 130/68 Reference Range: 80 - 120 mg / dl Electronic Signature(s) Signed: 05/03/2023 3:38:42 PM By: [name] Entered By: [name] on 05/03/2023 13:36:57

## 2023-05-10 ENCOUNTER — Ambulatory Visit (INDEPENDENT_AMBULATORY_CARE_PROVIDER_SITE_OTHER): Payer: No Typology Code available for payment source | Admitting: Family Medicine

## 2023-05-10 VITALS — BP 98/62 | HR 104 | Temp 97.7°F | Ht 72.0 in | Wt 187.0 lb

## 2023-05-10 DIAGNOSIS — E1142 Type 2 diabetes mellitus with diabetic polyneuropathy: Secondary | ICD-10-CM | POA: Diagnosis not present

## 2023-05-10 DIAGNOSIS — E782 Mixed hyperlipidemia: Secondary | ICD-10-CM

## 2023-05-10 DIAGNOSIS — I10 Essential (primary) hypertension: Secondary | ICD-10-CM | POA: Diagnosis not present

## 2023-05-10 NOTE — Patient Instructions (Signed)
Continue your medications.   I will reach out to our pharmacist.  Follow up in 6 months.

## 2023-05-11 ENCOUNTER — Ambulatory Visit: Payer: Medicare PPO | Admitting: Family Medicine

## 2023-05-13 MED ORDER — NOVOLOG MIX 70/30 FLEXPEN (70-30) 100 UNIT/ML ~~LOC~~ SUPN: 20.0000 [IU] | PEN_INJECTOR | Freq: Two times a day (BID) | SUBCUTANEOUS | 3 refills | Status: DC

## 2023-05-13 NOTE — Assessment & Plan Note (Signed)
BP on the soft side today.  Continue current medications.  May need to back off in the future.

## 2023-05-13 NOTE — Progress Notes (Signed)
Subjective:  Patient ID: [name], male    DOB: 1950/11/07  Age: 72 y.o. MRN: [id number]  CC:  Follow up   HPI:  72 year old male with hypertension, type 2 diabetes with complications including end-stage renal disease as well as history of amputation presents for follow-up.  BP on the low side today.  Patient remains on amlodipine, carvedilol, and torsemide.  Patient reports that there has been concern about his access site not working properly at dialysis.  He has an upcoming ultrasound and visit with vascular surgery.  Last A1c at goal. However, he has recently ran out of Trulicity. He was receiving this from [facility] (4.5 mg weekly). Remains on Novology 70/30 20 units BID. No hypoglycemia.   LDL has been at goal on Lipitor. He is tolerating.  No chest pain or SOB.  Patient Active Problem List   Diagnosis Date Noted   ESRD (end stage renal disease) (HCC) 02/08/2023   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) 11/10/2022   Former smoker 07/12/2022   History of stroke 07/12/2022   Diabetic neuropathy (HCC) 10/28/2021   Anemia in chronic kidney disease 05/27/2020   History of amputation of lesser toe of left foot (HCC) 08/18/2015   Hyperlipidemia, mixed 08/18/2015   Type 2 diabetes mellitus with peripheral neuropathy (HCC) 08/18/2015   Essential hypertension 04/17/2013    Social Hx   Social History   Socioeconomic History   Marital status: Married    Spouse name: [name]   Number of children: Not on file   Years of education: Not on file   Highest education level: Not on file  Occupational History   Not on file  Tobacco Use   Smoking status: Former   Smokeless tobacco: Never  Vaping Use   Vaping status: Never Used  Substance and Sexual Activity   Alcohol use: Not Currently   Drug use: Never   Sexual activity: Not on file  Other Topics Concern   Not on file  Social History Narrative   Not on file   Social Determinants of Health   Financial Resource  Strain: Not on file  Food Insecurity: Not on file  Transportation Needs: No Transportation Needs (08/17/2022)   PRAPARE - Administrator, Civil Service (Medical): No    Lack of Transportation (Non-Medical): No  Physical Activity: Not on file  Stress: Not on file  Social Connections: Not on file    Review of Systems Per HPI  Objective:  BP 98/62   Pulse (!) 104   Temp 97.7 F (36.5 C)   Ht 6' (1.829 m)   Wt 187 lb (84.8 kg)   SpO2 98%   BMI 25.36 kg/m      05/10/2023    2:39 PM 02/13/2023    1:41 PM 02/08/2023    2:19 PM  BP/Weight  Systolic BP 98 117 116  Diastolic BP 62 55 66  Wt. (Lbs) 187 186.6 193  BMI 25.36 kg/m2 25.31 kg/m2 26.18 kg/m2    Physical Exam Vitals reviewed.  Constitutional:      General: He is not in acute distress.    Appearance: Normal appearance.  HENT:     Head: Normocephalic and atraumatic.  Eyes:     General:        Right eye: No discharge.        Left eye: No discharge.     Conjunctiva/sclera: Conjunctivae normal.  Cardiovascular:     Rate and Rhythm: Normal rate and regular rhythm.  Pulmonary:     Effort: Pulmonary effort is normal.     Breath sounds: Normal breath sounds. No wheezing or rales.  Abdominal:     General: There is no distension.     Palpations: Abdomen is soft.     Tenderness: There is no abdominal tenderness.  Neurological:     Mental Status: He is alert.  Psychiatric:        Mood and Affect: Mood normal.        Behavior: Behavior normal.     Lab Results  Component Value Date   WBC 9.4 10/18/2022   HGB 11.6 (L) 10/20/2022   HCT 34.0 (L) 10/20/2022   PLT 210 10/18/2022   GLUCOSE 160 (H) 10/20/2022   CHOL 122 07/11/2022   TRIG 219 (H) 07/11/2022   HDL 30 (L) 07/11/2022   LDLCALC 56 07/11/2022   ALT 90 (H) 10/05/2022   AST 32 10/05/2022   NA 144 10/20/2022   K 4.9 10/20/2022   CL 114 (H) 10/20/2022   CREATININE 4.80 (H) 10/20/2022   BUN 35 (H) 10/20/2022   CO2 23 10/18/2022   HGBA1C 6.8  (H) 11/10/2022     Assessment & Plan:   Problem List Items Addressed This Visit       Cardiovascular and Mediastinum   Essential hypertension - Primary    BP on the soft side today.  Continue current medications.  May need to back off in the future.        Endocrine   Type 2 diabetes mellitus with peripheral neuropathy (HCC)    A1c has been at goal.  However, he is now out of Trulicity.  Will reach out to our pharmacist to see if we can get this back for him through [place].      Relevant Medications   NOVOLOG MIX 70/30 FLEXPEN (70-30) 100 UNIT/ML FlexPen     Other   Hyperlipidemia, mixed    Stable on Lipitor.  Continue.       Meds ordered this encounter  Medications   NOVOLOG MIX 70/30 FLEXPEN (70-30) 100 UNIT/ML FlexPen    Sig: Inject 20 Units into the skin 2 (two) times daily with a meal.    Dispense:  30 mL    Refill:  3    Follow-up:  Return in about 6 months (around 11/10/2023).  [name] Other DO [facility]

## 2023-05-13 NOTE — Assessment & Plan Note (Signed)
Stable on Lipitor.  Continue. 

## 2023-05-13 NOTE — Assessment & Plan Note (Signed)
A1c has been at goal.  However, he is now out of Trulicity.  Will reach out to our pharmacist to see if we can get this back for him through [place].

## 2023-05-15 ENCOUNTER — Other Ambulatory Visit (INDEPENDENT_AMBULATORY_CARE_PROVIDER_SITE_OTHER): Payer: Self-pay | Admitting: Nurse Practitioner

## 2023-05-15 ENCOUNTER — Telehealth: Payer: Self-pay | Admitting: Pharmacist

## 2023-05-15 DIAGNOSIS — Z992 Dependence on renal dialysis: Secondary | ICD-10-CM | POA: Diagnosis not present

## 2023-05-15 DIAGNOSIS — E1142 Type 2 diabetes mellitus with diabetic polyneuropathy: Secondary | ICD-10-CM

## 2023-05-15 DIAGNOSIS — N186 End stage renal disease: Secondary | ICD-10-CM | POA: Diagnosis not present

## 2023-05-15 MED ORDER — DULAGLUTIDE 4.5 MG/0.5ML ~~LOC~~ SOAJ
4.5000 mg | SUBCUTANEOUS | 4 refills | Status: DC
Start: 2023-05-20 — End: 2023-09-05

## 2023-05-15 NOTE — Progress Notes (Signed)
05/15/2023 Name: [name] MRN: [id number] DOB: 09/23/1951  Type 2 Diabetes  Patient has been without Trulicity.  He was previously on Trulcity 4.5mg  weekly via [employer].  PCP reached out to PharmD to get patient enrolled in medication assistance for 2024.  Trulicity has been sent to [facility] order (mail order pharmacy on behalf of [employer] [facility] [employer].  Latest A1c is not likely showing the absensece of Trulicity.  A1c has the potential to increase without his steady GLP1 therapy (Trulicity).   Patient reports affordability concerns with their medications: Yes  Patient reports access/transportation concerns to their pharmacy: No  Patient reports adherence concerns with their medications:  Yes  cost  Diabetes:  Current medications:  Medications tried in the past: Trulicity Patient denies personal or family history of medullary thyroid carcinoma (MTC) or in patients with Multiple Endocrine Neoplasia syndrome type 2 (MEN 2)  Current medication access support: previously enrolled in the [place] [employer] Patient [employer] for Trulicity; will initiate re-enrollment   Objective:  Lab Results  Component Value Date   HGBA1C 6.8 (H) 11/10/2022    Lab Results  Component Value Date   CREATININE 4.80 (H) 10/20/2022   BUN 35 (H) 10/20/2022   NA 144 10/20/2022   K 4.9 10/20/2022   CL 114 (H) 10/20/2022   CO2 23 10/18/2022    Lab Results  Component Value Date   CHOL 122 07/11/2022   HDL 30 (L) 07/11/2022   LDLCALC 56 07/11/2022   TRIG 219 (H) 07/11/2022   CHOLHDL 4.1 07/11/2022    Medications Reviewed Today     Reviewed by Danella Maiers, [facility] (Pharmacist) on 05/15/23 at [phone]  Med List Status: <None>   Medication Order Taking? Sig Documenting Provider Last Dose Status Informant  amLODipine (NORVASC) 10 MG tablet [id number]  Take 1 tablet (10 mg total) by mouth daily in the afternoon. Tommie Sams, DO   Active   aspirin EC 81 MG tablet [id number] No Take 81 mg by mouth daily. Swallow whole. [provider] Taking Active Spouse/Significant Other, Self, Pharmacy Records  atorvastatin (LIPITOR) 80 MG tablet [id number]  Take 1 tablet (80 mg total) by mouth daily in the afternoon. [name] Other G, DO  Active   calcitRIOL (ROCALTROL) 0.25 MCG capsule [id number] No Take 0.25 mcg by mouth daily in the afternoon. [provider] Taking Active Self, Spouse/Significant Other, Pharmacy Records  carvedilol (COREG) 6.25 MG tablet [id number]  Take 1 tablet (6.25 mg total) by mouth 2 (two) times daily. Cook, Yantis G, DO  Active   Continuous Glucose Sensor (FREESTYLE LIBRE 2 SENSOR) [place] [id number]  USE TO CHECK BLOOD GLUCOSE CONTINUOUSLY. CHANGE SENSOR EVERY 14 DAYS [name] Other G, [place]  Active   docusate sodium (COLACE) 100 MG capsule [id number] No Take 100 mg by mouth daily. [provider] Taking Active Spouse/Significant Other, Self, Pharmacy Records  Dulaglutide 4.5 MG/0.5ML SOPN [id number]  Inject 4.5 mg into the skin every Sunday. Tommie Sams, DO  Active            Med Note Danella Maiers   Tue May 15, 2023  8:04 AM) Re-enroll [employer] Patient Assistance for 2024  NOVOLOG MIX 70/30 FLEXPEN (70-30) 100 UNIT/ML FlexPen [id number]  Inject 20 Units into the skin 2 (two) times daily with a meal. Tommie Sams, DO  Active   torsemide (DEMADEX) 20 MG tablet [id number] No Take 20 mg by mouth daily in the  afternoon. [provider] Taking Active             Assessment/Plan:   Type 2 Diabetes:  - Recommend to Re-enroll for Trulicity medication assistance  - Recommend to check glucose daily (fasting) or if symptomatic - Meets financial criteria for Trulicity patient assistance program through the [employer]. Will collaborate with provider, CPhT, and patient to pursue assistance.   Follow Up Plan: 05/23/23 with PharmD     Kieth Brightly, PharmD, BCACP Clinical Pharmacist, [facility]

## 2023-05-16 ENCOUNTER — Encounter (INDEPENDENT_AMBULATORY_CARE_PROVIDER_SITE_OTHER): Payer: Self-pay | Admitting: Nurse Practitioner

## 2023-05-16 ENCOUNTER — Ambulatory Visit (INDEPENDENT_AMBULATORY_CARE_PROVIDER_SITE_OTHER): Payer: Self-pay

## 2023-05-16 ENCOUNTER — Ambulatory Visit (INDEPENDENT_AMBULATORY_CARE_PROVIDER_SITE_OTHER): Payer: No Typology Code available for payment source | Admitting: Nurse Practitioner

## 2023-05-16 VITALS — BP 123/69 | HR 90 | Resp 16 | Wt 191.0 lb

## 2023-05-16 DIAGNOSIS — N186 End stage renal disease: Secondary | ICD-10-CM

## 2023-05-16 DIAGNOSIS — E782 Mixed hyperlipidemia: Secondary | ICD-10-CM | POA: Diagnosis not present

## 2023-05-16 DIAGNOSIS — E1142 Type 2 diabetes mellitus with diabetic polyneuropathy: Secondary | ICD-10-CM | POA: Diagnosis not present

## 2023-05-16 NOTE — Progress Notes (Signed)
Subjective:    Patient ID: [name], male    DOB: 1950-10-22, 72 y.o.   MRN: [id number] Chief Complaint  Patient presents with   Follow-up    Ultrasound follow up    The patient returns to the office for followup of their dialysis access.   The patient reports the function of the access has been stable. Patient denies difficulty with cannulation. The patient denies increased bleeding time after removing the needles. The patient denies hand pain or other symptoms consistent with steal phenomena.  No significant arm swelling.  The patient denies any complaints from the dialysis center or their nephrologist.  The patient denies redness or swelling at the access site. The patient denies fever or chills at home or while on dialysis.  No recent shortening of the patient's walking distance or new symptoms consistent with claudication.  No history of rest pain symptoms. No new ulcers or wounds of the lower extremities have occurred.  The patient denies amaurosis fugax or recent TIA symptoms. There are no recent neurological changes noted. There is no history of DVT, PE or superficial thrombophlebitis. No recent episodes of angina or shortness of breath documented.   Duplex ultrasound of the AV access shows a patent access.  The previously noted stenosis is improved significantly  compared to last study.  Flow volume today is 1854 cc/min (previous flow volume was 2687 cc/min)      Review of Systems  Musculoskeletal:  Positive for gait problem.  All other systems reviewed and are negative.      Objective:   Physical Exam Vitals reviewed.  HENT:     Head: Normocephalic.  Cardiovascular:     Rate and Rhythm: Normal rate.     Arteriovenous access: Left arteriovenous access is present.    Comments: Good thrill and bruit Pulmonary:     Effort: Pulmonary effort is normal.  Skin:    General: Skin is warm and dry.  Neurological:     Mental Status: He is alert and oriented to  person, place, and time.  Psychiatric:        Mood and Affect: Mood normal.        Behavior: Behavior normal.        Thought Content: Thought content normal.        Judgment: Judgment normal.     BP 123/69 (BP Location: Right Arm)   Pulse 90   Resp 16   Wt 191 lb (86.6 kg)   BMI 25.90 kg/m   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)    Diabetic neuropathy (HCC)    Diabetic ulcer of foot associated with diabetes mellitus due to underlying condition, limited to breakdown of skin (HCC)    left   GERD (gastroesophageal reflux disease)    Hyperlipidemia    Hypertension    Non compliance w medication regimen    Osteomyelitis (HCC) 08/2022   great toe of right foot   Osteomyelitis of left foot (HCC)    Secondary hyperparathyroidism of renal origin [facility])    per nephrology lov dr Thedore Mins 05-22-2022   Stroke [facility]) 2016   no deficits    Social History   Socioeconomic History   Marital status: Married    Spouse name: [name]   Number of children: Not on file   Years of education: Not on file  Highest education level: Not on file  Occupational History   Not on file  Tobacco Use   Smoking status: Former   Smokeless tobacco: Never  Vaping Use   Vaping status: Never Used  Substance and Sexual Activity   Alcohol use: Not Currently   Drug use: Never   Sexual activity: Not on file  Other Topics Concern   Not on file  Social History Narrative   Not on file   Social Determinants of Health   Financial Resource Strain: Not on file  Food Insecurity: Not on file  Transportation Needs: No Transportation Needs (08/17/2022)   PRAPARE - Administrator, Civil Service (Medical): No    Lack of Transportation (Non-Medical): No  Physical Activity: Not on file  Stress: Not on file  Social Connections: Not on file  Intimate Partner Violence: Not on  file    Past Surgical History:  Procedure Laterality Date   AMPUTATION OF REPLICATED TOES Left 2018   left big toe, second toe   APPENDECTOMY     AV FISTULA PLACEMENT Left 10/20/2022   Procedure: INSERTION OF ARTERIOVENOUS (AV) GORE-TEX GRAFT ARM ( BRACHIAL AXILLARY);  Surgeon: Renford Dills, MD;  Location: [facility];  Service: Vascular;  Laterality: Left;   BIOPSY  08/10/2020   Procedure: BIOPSY;  Surgeon: Dolores Frame, MD;  [facility];  Service: Gastroenterology;;   CHOLECYSTECTOMY     COLON SURGERY     blockage   COLONOSCOPY WITH PROPOFOL N/A 08/10/2020   Procedure: COLONOSCOPY WITH PROPOFOL;  Surgeon: Dolores Frame, MD;  Location: [facility];  Service: Gastroenterology;  Laterality: N/A;  815   GRAFT APPLICATION Left 12/08/2019   Procedure: GRAFT APPLICATION;  Surgeon: Asencion Islam, DPM;  Location: [facility];  Service: Podiatry;  Laterality: Left;   HERNIA REPAIR     I & D EXTREMITY Left 10/10/2021   Procedure: IRRIGATION AND DEBRIDEMENT left foot;  Surgeon: Park Liter, DPM;  Location: [facility];  Service: Podiatry;  Laterality: Left;   I & D EXTREMITY Right 08/19/2022   Procedure: IRRIGATION AND DEBRIDEMENT RIGHT GREAT TOE GRAFT APPLICATION WITH BIOPSY;  Surgeon: Edwin Cap, DPM;  Location: [facility];  Service: Podiatry;  Laterality: Right;   IRRIGATION AND DEBRIDEMENT FOOT Left 10/16/2021   Procedure: IRRIGATION AND DEBRIDEMENT FOOT;  Surgeon: Asencion Islam, DPM;  Location: [facility];  Service: Podiatry;  Laterality: Left;  Abscess left heel   METATARSAL HEAD EXCISION Left 12/08/2019   Procedure: METATARSAL HEAD RECESSION THIRD LEFT;  Surgeon: Asencion Islam, DPM;  Location: [facility];  Service: Podiatry;  Laterality: Left;   POLYPECTOMY  08/10/2020   Procedure: POLYPECTOMY;  Surgeon: Dolores Frame, MD;  [facility];  Service: Gastroenterology;;   WOUND DEBRIDEMENT Left  12/08/2019   Procedure: DEBRIDEMENT WOUND;  Surgeon: Asencion Islam, DPM;  Location: [facility];  Service: Podiatry;  Laterality: Left;   WOUND DEBRIDEMENT Left 10/12/2021   Procedure: LEFT FOOT WOUND DEBRIDEMENT AND IRRIGATION, 1ST METATARSAL RESECTION;  Surgeon: Park Liter, DPM;  Location: [facility];  Service: Podiatry;  Laterality: Left;    History reviewed. No pertinent family history.  Allergies  Allergen Reactions   Losartan Potassium Other (See Comments)    hyperkalemia   Sildenafil Other (See Comments)    Not effective. "Did not like the way it made me feel"       Latest Ref Rng & Units 10/20/2022  6:43 AM 10/18/2022    1:54 PM 10/05/2022    9:46 AM  CBC  WBC 4.0 - 10.5 K/uL  9.4  8.6   Hemoglobin 13.0 - 17.0 g/dL 09.8  11.9  14.7   Hematocrit 39.0 - 52.0 % 34.0  35.7  38.8   Platelets 150 - 400 K/uL  210  203       CMP     Component Value Date/Time   NA 144 10/20/2022 0643   K 4.9 10/20/2022 0643   CL 114 (H) 10/20/2022 0643   CO2 23 10/18/2022 1354   GLUCOSE 160 (H) 10/20/2022 0643   BUN 35 (H) 10/20/2022 0643   CREATININE 4.80 (H) 10/20/2022 0643   CREATININE 4.39 (H) 10/05/2022 0946   CALCIUM 8.9 10/18/2022 1354   PROT 7.2 10/05/2022 0946   ALBUMIN 3.3 (L) 08/16/2022 2146   AST 32 10/05/2022 0946   ALT 90 (H) 10/05/2022 0946   ALKPHOS 68 08/16/2022 2146   BILITOT 0.3 10/05/2022 0946   EGFR 16 (L) 05/12/2022 0901   GFRNONAA 16 (L) 10/18/2022 1354     No results found.     Assessment & Plan:   1. ESRD (end stage renal disease) (HCC) Recommend:  The patient is doing well and currently has adequate dialysis access. The patient's dialysis center is not reporting any access issues. Flow pattern is stable when compared to the prior ultrasound.  The patient should have a duplex ultrasound of the dialysis access in 6 months. The patient will follow-up with me in the office after each ultrasound    2. Type 2 diabetes mellitus with  peripheral neuropathy (HCC) Continue hypoglycemic medications as already ordered, these medications have been reviewed and there are no changes at this time.  Hgb A1C to be monitored as already arranged by primary service  3. Hyperlipidemia, mixed Continue statin as ordered and reviewed, no changes at this time   Current Outpatient Medications on File Prior to Visit  Medication Sig Dispense Refill   amLODipine (NORVASC) 10 MG tablet Take 1 tablet (10 mg total) by mouth daily in the afternoon. 90 tablet 3   aspirin EC 81 MG tablet Take 81 mg by mouth daily. Swallow whole.     atorvastatin (LIPITOR) 80 MG tablet Take 1 tablet (80 mg total) by mouth daily in the afternoon. 90 tablet 3   calcitRIOL (ROCALTROL) 0.25 MCG capsule Take 0.25 mcg by mouth daily in the afternoon.     carvedilol (COREG) 6.25 MG tablet Take 1 tablet (6.25 mg total) by mouth 2 (two) times daily. 180 tablet 3   Continuous Glucose Sensor (FREESTYLE LIBRE 2 SENSOR) MISC USE TO CHECK BLOOD GLUCOSE CONTINUOUSLY. CHANGE SENSOR EVERY 14 DAYS 6 each 3   docusate sodium (COLACE) 100 MG capsule Take 100 mg by mouth daily.     [START ON 05/20/2023] Dulaglutide 4.5 MG/0.5ML SOPN Inject 4.5 mg into the skin every Sunday. 6 mL 4   NOVOLOG MIX 70/30 FLEXPEN (70-30) 100 UNIT/ML FlexPen Inject 20 Units into the skin 2 (two) times daily with a meal. 30 mL 3   torsemide (DEMADEX) 20 MG tablet Take 20 mg by mouth daily in the afternoon.     No current facility-administered medications on file prior to visit.    There are no Patient Instructions on file for this visit. No follow-ups on file.   Georgiana Spinner, NP

## 2023-05-18 ENCOUNTER — Encounter (HOSPITAL_BASED_OUTPATIENT_CLINIC_OR_DEPARTMENT_OTHER): Payer: No Typology Code available for payment source | Admitting: General Surgery

## 2023-05-18 DIAGNOSIS — L97524 Non-pressure chronic ulcer of other part of left foot with necrosis of bone: Secondary | ICD-10-CM | POA: Diagnosis not present

## 2023-05-18 DIAGNOSIS — L89899 Pressure ulcer of other site, unspecified stage: Secondary | ICD-10-CM | POA: Diagnosis not present

## 2023-05-18 DIAGNOSIS — E11621 Type 2 diabetes mellitus with foot ulcer: Secondary | ICD-10-CM | POA: Diagnosis not present

## 2023-05-18 NOTE — Progress Notes (Signed)
[name], [name] ([id number]) [id number] Page 1 of 9 Visit Report for 05/18/2023 Chief Complaint Document Details Patient Name: Date of Service: [name], [name] 05/18/2023 10:45 A M Medical Record Number: [id number] Patient Account Number: [id number] Date of Birth/Sex: Treating RN: 1950-12-19 (72 y.o. M) Primary Care Provider: [name] Other Other Clinician: Referring Provider: Treating Provider/Extender: [name] Weeks in Treatment: 31 Information Obtained from: Patient Chief Complaint Patients presents for treatment of an open diabetic ulcer on the right great toe Electronic Signature(s) Signed: 05/18/2023 11:23:40 AM By: [name] MD FACS Entered By: [name] on 05/18/2023 11:23:40 -------------------------------------------------------------------------------- HPI Details Patient Name: Date of Service: [name], [name] 05/18/2023 10:45 A M Medical Record Number: [id number] Patient Account Number: [id number] Date of Birth/Sex: Treating RN: July 24, 1951 (72 y.o. M) Primary Care Provider: [name] Other Other Clinician: Referring Provider: Treating Provider/Extender: [name] Weeks in Treatment: 31 History of Present Illness HPI Description: ADMISSION 10/12/2022 This is a 72 year old type II diabetic (last hemoglobin A1c 7.7 in October 2023) with stage V chronic kidney disease and hypertension. He has been followed by podiatry for an ulcer on his right great toe. He has a history of prior toe and foot infections requiring amputation. In November 2023, he had an outpatient MRI that demonstrated osteomyelitis. He was admitted to the hospital for IV antibiotics and subsequently underwent irrigation and debridement of the toe with biopsy and placement of married 3 layer matrix. Cultures grew out MRSA and osteomyelitis was seen on pathology. He was followed by infectious disease and given a course of doxycycline and  Augmentin. He completed this course at the end of December. He has been evaluated by vascular surgery with formal ABIs as well as consultation. He does have adequate blood flow at least to the dorsalis pedis. He last saw podiatry on January 4 of this year where he underwent debridement. It was felt he would benefit from specialized wound care and he was referred to our center for further evaluation and management. On the dorsal aspect of his right great toe, there is a circular ulcer. There is a fair amount of undermining from callus and dry skin. The surface is a bit pale and fibrotic, but there is no obvious necrosis. 10/19/2022: He has been approved by his insurance to undergo hyperbaric oxygen therapy. EKG has been obtained and is normal. We are still waiting on his chest x-ray. The wound is clean with just a little bit of periwound callus and slough on the surface. Tendon is exposed. 10/26/2022: Unfortunately, the required co-pay from his insurance is cost prohibitive for him and he will not be able to receive hyperbaric oxygen therapy. Some moisture got under the dry skin around his wound and lifted it off but there is good epithelialized tissue beneath this. The exposed tendon has deteriorated. 11/03/2022: After some discussion with the financial office, it has become feasible for the patient to undergo hyperbaric oxygen therapy and he has received his first treatments this week. The wound is a little bit smaller and the tissue has a better color to it, but there is still extensive nonviable subcutaneous tissue and slough present. 11/09/2022: The wound measured a little bit smaller today. There is still slough and nonviable subcu tissue and tendon present. 11/16/2022: The wound continues to contract. There is still substantial depth to it, however, and bone remains exposed at the base. This is also a (early) 30-day update for HBO. 11/27/2022: Unfortunately, the wound has deteriorated. It appears that  the tissue has become macerated. There is a second ulcer on the lateral aspect of his great toe. The nail is lifting off of the bed and there is wet skin lifting off of the underlying tissue. The initial wound is smaller but still has a fair amount of depth to it. The patient does report quite a bit more drainage since his last visit. DHAVAL, RADCLIFF ([id number]) [id number] Page 2 of 9 12/20/2022: For scheduling reasons, somehow we have missed having a wound care visit in between his hyperbaric oxygen therapy treatments. The 2 openings of the wound have converged into 1 large site. There is necrotic tendon present at the lateral aspect of the great toe. The bone is visible, including the joint space. The area of the initial wound is flush with the surrounding skin. No malodor or purulent drainage. 12/28/2022: The wound is vastly improved this week. The 2 areas of the wound have separated due to tissue bridging and healing the space between. There is no more necrotic tissue visible, although the bone/joint space remains exposed. 01/04/2023: His wound continues to improve. The more medial aspect of the toe is nearly closed. Bone remains exposed laterally. He has been approved for Epicord. He is going to start dialysis next week. 01/11/2023: The medial aspect of the wound is closed completely. The lateral open portion of his wound has contracted and is shallower. There is still bone exposure. He has started dialysis. We are going to apply Epicord today. 01/19/2023: There is good granulation tissue starting to fill in his wound. Bone remains exposed, however. 01/26/2023: The wound is smaller again today. The granulation tissue is a bit hypertrophic. There is still an area of bone that remains exposed. 02/02/2023: The wound is about the same size but has better tissue coverage. There is a protruding point of bone present. His insurance has changed and we are needing to secure approval  again for his hyperbaric oxygen therapy treatments. 02/09/2023: The wound is quite a bit smaller today and there is robust granulation tissue on the surface. He has decided to discontinue his hyperbaric oxygen treatments due to the financial issues associated with traveling to the wound care center on a daily basis, as well as his dialysis sessions. 02/16/2023: The wound continues to contract. There is very nice granulation tissue present and no slough or other debris accumulation. 02/22/2023: The wound is quite a bit smaller today. There is still some depth to it and I can still probe bone. 03/02/2023: The wound has contracted further. There is granulation tissue filling in the base. There is still a narrow passage through which I can probe to bone. 03/09/2023: The wound is down to just a very small hole through which I can still probe to bone. The rest of the wound has epithelialized. 03/16/2023: No real change in the wound dimensions. Bone is still exposed at the base. The edges are rolling inward, suggesting the wound has stalled. 03/26/2023: The wound is about a millimeter in diameter. A skinny probe just barely fits through the opening but I still feel bone at the base. 04/02/2023: The wound is just a tiny bit larger today. Bone is still able to be probed at the base. 04/10/2023: The wound is unchanged. The bone remains exposed. We are awaiting insurance approval for [facility]. 04/18/2023: The wound is back down to being about a millimeter in diameter, with a skinny probe barely fitting through the opening. Bone is still directly underneath the surface. No word regarding insurance approval for  [facility]. 05/03/2023: The wound is stable in size, but there seems to be some tissue starting to close over the bone. 05/18/2023: The depth of the wound seems to be a little bit less today and I no longer appreciate the gritty texture of bone when I probe the wound. Electronic Signature(s) Signed: 05/18/2023 11:24:33 AM By:  [name] MD FACS Entered By: [name] on 05/18/2023 11:24:32 -------------------------------------------------------------------------------- Physical Exam Details Patient Name: Date of Service: [name], [name] 05/18/2023 10:45 A M Medical Record Number: [id number] Patient Account Number: [id number] Date of Birth/Sex: Treating RN: 11-Jul-1951 (72 y.o. M) Primary Care Provider: [name] Other Other Clinician: Referring Provider: Treating Provider/Extender: [name] Weeks in Treatment: 31 Constitutional Slightly hypertensive. . . . no acute distress. Respiratory Normal work of breathing on room air. Notes 05/18/2023: The depth of the wound seems to be a little bit less today and I no longer appreciate the gritty texture of bone when I probe the wound. Electronic Signature(s) Signed: 05/18/2023 11:25:54 AM By: [name] MD FACS Entered By: [name] on 05/18/2023 11:25:53 Sharman, [name] ([id number]) [id number] Page 3 of 9 -------------------------------------------------------------------------------- Physician Orders Details Patient Name: Date of Service: [name], [name] 05/18/2023 10:45 A M Medical Record Number: [id number] Patient Account Number: [id number] Date of Birth/Sex: Treating RN: 1950/10/11 (72 y.o. Yates Decamp Primary Care Provider: [name] Other Other Clinician: Referring Provider: Treating Provider/Extender: [name] Weeks in Treatment: 31 Verbal / Phone Orders: No Diagnosis Coding ICD-10 Coding Code Description L97.514 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.621 Type 2 diabetes mellitus with foot ulcer N18.6 End stage renal disease M86.9 Osteomyelitis, unspecified E11.42 Type 2 diabetes mellitus with diabetic polyneuropathy I10 Essential (primary) hypertension Follow-up Appointments ppointment in 2 weeks. - Dr. Lady Gary - room  3 Return A Anesthetic Wound #1 Right,Medial T Great oe (In clinic) Topical Lidocaine 4% applied to wound bed Bathing/ Shower/ Hygiene May shower and wash wound with soap and water. Off-Loading Wound #1 Right,Medial T Great oe Other: - Diabetic shoe Wound Treatment Wound #1 - T Great oe Wound Laterality: Right, Medial Cleanser: Soap and Water 1 x Per Week/30 Days Discharge Instructions: May shower and wash wound with dial antibacterial soap and water prior to dressing change. Cleanser: Wound Cleanser 1 x Per Week/30 Days Discharge Instructions: Cleanse the wound with wound cleanser prior to applying a clean dressing using gauze sponges, not tissue or cotton balls. Topical: Gentamicin 1 x Per Week/30 Days Discharge Instructions: Apply with Mupirocin (Mix Mupirocin and Getamicin ointment) Topical: Mupirocin Ointment 1 x Per Week/30 Days Discharge Instructions: Apply Mupirocin (Bactroban) with Gentamicinas instructed Prim Dressing: Promogran Prisma Matrix, 4.34 (sq in) (silver collagen) 1 x Per Week/30 Days ary Discharge Instructions: Moisten collagen with saline or hydrogel Secondary Dressing: Woven Gauze Sponges 2x2 in 1 x Per Week/30 Days Discharge Instructions: Apply over primary dressing as directed. Secured With: Insurance underwriter, Sterile 2x75 (in/in) 1 x Per Week/30 Days Discharge Instructions: Secure with stretch gauze as directed. Secured With: 79M [employer] Scientist, research (life sciences) Surgical T 2x10 (in/yd) 1 x Per Week/30 Days ape Discharge Instructions: Secure with tape as directed. Electronic Signature(s) Signed: 05/18/2023 11:55:54 AM By: [name] MD FACS Seward, [name] ([id number]) [id number] Page 4 of 9 Entered By: [name] on 05/18/2023 11:27:43 -------------------------------------------------------------------------------- Problem List Details Patient Name: Date of Service: [name], [name] 05/18/2023 10:45 A M Medical  Record Number: [id number] Patient Account Number: [id number] Date  of Birth/Sex: Treating RN: May 22, 1951 (72 y.o. Yates Decamp Primary Care Provider: [name] Other Other Clinician: Referring Provider: Treating Provider/Extender: [name] Weeks in Treatment: 31 Active Problems ICD-10 Encounter Code Description Active Date MDM Diagnosis L97.514 Non-pressure chronic ulcer of other part of right foot with necrosis of bone 10/12/2022 No Yes E11.621 Type 2 diabetes mellitus with foot ulcer 10/12/2022 No Yes N18.6 End stage renal disease 10/12/2022 No Yes M86.9 Osteomyelitis, unspecified 10/12/2022 No Yes E11.42 Type 2 diabetes mellitus with diabetic polyneuropathy 10/12/2022 No Yes I10 Essential (primary) hypertension 10/12/2022 No Yes Inactive Problems Resolved Problems Electronic Signature(s) Signed: 05/18/2023 11:23:21 AM By: [name] MD FACS Entered By: [name] on 05/18/2023 11:23:21 -------------------------------------------------------------------------------- Progress Note Details Patient Name: Date of Service: [name], [name] 05/18/2023 10:45 A M Medical Record Number: [id number] Patient Account Number: [id number] Date of Birth/Sex: Treating RN: 1951-02-16 (72 y.o. M) Primary Care Provider: [name] Other Other Clinician: Referring Provider: Treating Provider/Extender: [name] Weeks in Treatment: [address] [name] ([id number]) [id number] Page 5 of 9 Chief Complaint Information obtained from Patient Patients presents for treatment of an open diabetic ulcer on the right great toe History of Present Illness (HPI) ADMISSION 10/12/2022 This is a 72 year old type II diabetic (last hemoglobin A1c 7.7 in October 2023) with stage V chronic kidney disease and hypertension. He has been followed by podiatry for an ulcer on his right great toe. He has a history of prior toe and foot infections  requiring amputation. In November 2023, he had an outpatient MRI that demonstrated osteomyelitis. He was admitted to the hospital for IV antibiotics and subsequently underwent irrigation and debridement of the toe with biopsy and placement of married 3 layer matrix. Cultures grew out MRSA and osteomyelitis was seen on pathology. He was followed by infectious disease and given a course of doxycycline and Augmentin. He completed this course at the end of December. He has been evaluated by vascular surgery with formal ABIs as well as consultation. He does have adequate blood flow at least to the dorsalis pedis. He last saw podiatry on January 4 of this year where he underwent debridement. It was felt he would benefit from specialized wound care and he was referred to our center for further evaluation and management. On the dorsal aspect of his right great toe, there is a circular ulcer. There is a fair amount of undermining from callus and dry skin. The surface is a bit pale and fibrotic, but there is no obvious necrosis. 10/19/2022: He has been approved by his insurance to undergo hyperbaric oxygen therapy. EKG has been obtained and is normal. We are still waiting on his chest x-ray. The wound is clean with just a little bit of periwound callus and slough on the surface. Tendon is exposed. 10/26/2022: Unfortunately, the required co-pay from his insurance is cost prohibitive for him and he will not be able to receive hyperbaric oxygen therapy. Some moisture got under the dry skin around his wound and lifted it off but there is good epithelialized tissue beneath this. The exposed tendon has deteriorated. 11/03/2022: After some discussion with the financial office, it has become feasible for the patient to undergo hyperbaric oxygen therapy and he has received his first treatments this week. The wound is a little bit smaller and the tissue has a better color to it, but there is still extensive nonviable  subcutaneous tissue and slough present. 11/09/2022: The wound measured a little bit smaller today. There is still  slough and nonviable subcu tissue and tendon present. 11/16/2022: The wound continues to contract. There is still substantial depth to it, however, and bone remains exposed at the base. This is also a (early) 30-day update for HBO. 11/27/2022: Unfortunately, the wound has deteriorated. It appears that the tissue has become macerated. There is a second ulcer on the lateral aspect of his great toe. The nail is lifting off of the bed and there is wet skin lifting off of the underlying tissue. The initial wound is smaller but still has a fair amount of depth to it. The patient does report quite a bit more drainage since his last visit. 12/20/2022: For scheduling reasons, somehow we have missed having a wound care visit in between his hyperbaric oxygen therapy treatments. The 2 openings of the wound have converged into 1 large site. There is necrotic tendon present at the lateral aspect of the great toe. The bone is visible, including the joint space. The area of the initial wound is flush with the surrounding skin. No malodor or purulent drainage. 12/28/2022: The wound is vastly improved this week. The 2 areas of the wound have separated due to tissue bridging and healing the space between. There is no more necrotic tissue visible, although the bone/joint space remains exposed. 01/04/2023: His wound continues to improve. The more medial aspect of the toe is nearly closed. Bone remains exposed laterally. He has been approved for Epicord. He is going to start dialysis next week. 01/11/2023: The medial aspect of the wound is closed completely. The lateral open portion of his wound has contracted and is shallower. There is still bone exposure. He has started dialysis. We are going to apply [facility] today. 01/19/2023: There is good granulation tissue starting to fill in his wound. Bone remains exposed,  however. 01/26/2023: The wound is smaller again today. The granulation tissue is a bit hypertrophic. There is still an area of bone that remains exposed. 02/02/2023: The wound is about the same size but has better tissue coverage. There is a protruding point of bone present. His insurance has changed and we are needing to secure approval again for his hyperbaric oxygen therapy treatments. 02/09/2023: The wound is quite a bit smaller today and there is robust granulation tissue on the surface. He has decided to discontinue his hyperbaric oxygen treatments due to the financial issues associated with traveling to the wound care center on a daily basis, as well as his dialysis sessions. 02/16/2023: The wound continues to contract. There is very nice granulation tissue present and no slough or other debris accumulation. 02/22/2023: The wound is quite a bit smaller today. There is still some depth to it and I can still probe bone. 03/02/2023: The wound has contracted further. There is granulation tissue filling in the base. There is still a narrow passage through which I can probe to bone. 03/09/2023: The wound is down to just a very small hole through which I can still probe to bone. The rest of the wound has epithelialized. 03/16/2023: No real change in the wound dimensions. Bone is still exposed at the base. The edges are rolling inward, suggesting the wound has stalled. 03/26/2023: The wound is about a millimeter in diameter. A skinny probe just barely fits through the opening but I still feel bone at the base. 04/02/2023: The wound is just a tiny bit larger today. Bone is still able to be probed at the base. 04/10/2023: The wound is unchanged. The bone remains exposed. We are awaiting  Therapist, occupational for [facility]. 04/18/2023: The wound is back down to being about a millimeter in diameter, with a skinny probe barely fitting through the opening. Bone is still directly underneath the surface. No word regarding insurance  approval for [facility]. 05/03/2023: The wound is stable in size, but there seems to be some tissue starting to close over the bone. 05/18/2023: The depth of the wound seems to be a little bit less today and I no longer appreciate the gritty texture of bone when I probe the wound. Patient History Information obtained from Patient. Family History Diabetes - Mother,Siblings, Heart Disease - Siblings, Hypertension - Siblings, Seizures - Father, Stroke - Siblings, Sparkill, Neftali ([id number]) [id number] Page 6 of 9 No family history of Cancer, Hereditary Spherocytosis, Kidney Disease, Lung Disease, Thyroid Problems, Tuberculosis. Social History Former smoker, Marital Status - Married, Alcohol Use - Rarely, Drug Use - No History, Caffeine Use - Daily. Medical History Eyes Patient has history of Cataracts - rt eye Ear/Nose/Mouth/Throat Denies history of Chronic sinus problems/congestion Hematologic/Lymphatic Denies history of Anemia, Hemophilia, Human Immunodeficiency Virus, Lymphedema, Sickle Cell Disease Respiratory Denies history of Aspiration, Asthma, Chronic Obstructive Pulmonary Disease (COPD), Pneumothorax, Tuberculosis Cardiovascular Patient has history of Hypertension Gastrointestinal Denies history of Cirrhosis , Colitis, Crohns, Hepatitis A, Hepatitis B, Hepatitis C Endocrine Patient has history of Type II Diabetes Genitourinary Denies history of End Stage Renal Disease Immunological Denies history of Lupus Erythematosus, Raynauds, Scleroderma Integumentary (Skin) Denies history of History of Burn Musculoskeletal Patient has history of Osteomyelitis - MRI confirmed (08/16/22) Denies history of Gout Neurologic Patient has history of Neuropathy Psychiatric Patient has history of Confinement Anxiety Denies history of Anorexia/bulimia Hospitalization/Surgery History - AV fistula place left upper arm 10/20/22. Medical A Surgical History  Notes nd Genitourinary CKD IV Objective Constitutional Slightly hypertensive. no acute distress. Vitals Time Taken: 10:52 AM, Height: 72 in, Weight: 185 lbs, BMI: 25.1, Temperature: 98.1 F, Pulse: 96 bpm, Respiratory Rate: 18 breaths/min, Blood Pressure: 145/67 mmHg. Respiratory Normal work of breathing on room air. General Notes: 05/18/2023: The depth of the wound seems to be a little bit less today and I no longer appreciate the gritty texture of bone when I probe the wound. Integumentary (Hair, Skin) Wound #1 status is Open. Original cause of wound was Pressure Injury. The date acquired was: 08/16/2022. The wound has been in treatment 31 weeks. The wound is located on the Right,Medial T Great. The wound measures 0.2cm length x 0.2cm width x 0.1cm depth; 0.031cm^2 area and 0.003cm^3 volume. oe There is bone and Fat Layer (Subcutaneous Tissue) exposed. There is no tunneling or undermining noted. There is a small amount of serosanguineous drainage noted. The wound margin is distinct with the outline attached to the wound base. There is large (67-100%) red granulation within the wound bed. There is a small (1-33%) amount of necrotic tissue within the wound bed including Adherent Slough. The periwound skin appearance had no abnormalities noted for moisture. The periwound skin appearance had no abnormalities noted for color. The periwound skin appearance exhibited: Callus. Periwound temperature was noted as No Abnormality. Assessment Active Problems ICD-10 Non-pressure chronic ulcer of other part of right foot with necrosis of bone Type 2 diabetes mellitus with foot ulcer End stage renal disease Osteomyelitis, unspecified Type 2 diabetes mellitus with diabetic polyneuropathy Essential (primary) hypertension Malkowski, Kavin ([id number]) [id number] Page 7 of 9 Plan Follow-up Appointments: Return Appointment in 2 weeks. - Dr. Lady Gary - room 3 Anesthetic: Wound  #1 Right,Medial T Great: oe (In clinic)  Topical Lidocaine 4% applied to wound bed Bathing/ Shower/ Hygiene: May shower and wash wound with soap and water. Off-Loading: Wound #1 Right,Medial T Great: oe Other: - Diabetic shoe WOUND #1: - T Great Wound Laterality: Right, Medial oe Cleanser: Soap and Water 1 x Per Week/30 Days Discharge Instructions: May shower and wash wound with dial antibacterial soap and water prior to dressing change. Cleanser: Wound Cleanser 1 x Per Week/30 Days Discharge Instructions: Cleanse the wound with wound cleanser prior to applying a clean dressing using gauze sponges, not tissue or cotton balls. Topical: Gentamicin 1 x Per Week/30 Days Discharge Instructions: Apply with Mupirocin (Mix Mupirocin and Getamicin ointment) Topical: Mupirocin Ointment 1 x Per Week/30 Days Discharge Instructions: Apply Mupirocin (Bactroban) with Gentamicinas instructed Prim Dressing: Promogran Prisma Matrix, 4.34 (sq in) (silver collagen) 1 x Per Week/30 Days ary Discharge Instructions: Moisten collagen with saline or hydrogel Secondary Dressing: Woven Gauze Sponges 2x2 in 1 x Per Week/30 Days Discharge Instructions: Apply over primary dressing as directed. Secured With: Insurance underwriter, Sterile 2x75 (in/in) 1 x Per Week/30 Days Discharge Instructions: Secure with stretch gauze as directed. Secured With: 21M [employer] Scientist, research (life sciences) Surgical T 2x10 (in/yd) 1 x Per Week/30 Days ape Discharge Instructions: Secure with tape as directed. 05/18/2023: The depth of the wound seems to be a little bit less today and I no longer appreciate the gritty texture of bone when I probe the wound. No debridement was necessary today. We will continue to pack the wound with the mixture of topical gentamicin and mupirocin followed by Prisma silver collagen. Follow-up in 2 weeks. Electronic Signature(s) Signed: 05/18/2023 11:28:22 AM By: [name] MD FACS Entered By: [name] on 05/18/2023 11:28:22 -------------------------------------------------------------------------------- HxROS Details Patient Name: Date of Service: [name], [name] 05/18/2023 10:45 A M Medical Record Number: [id number] Patient Account Number: [id number] Date of Birth/Sex: Treating RN: January 11, 1951 (72 y.o. M) Primary Care Provider: [name] Other Other Clinician: Referring Provider: Treating Provider/Extender: [name] Weeks in Treatment: 31 Information Obtained From Patient Eyes Medical History: Positive for: Cataracts - rt eye Ear/Nose/Mouth/Throat Medical History: Negative for: Chronic sinus problems/congestion Hematologic/Lymphatic TREYSHAWN, GERRY ([id number]) [id number] Page 8 of 9 Medical History: Negative for: Anemia; Hemophilia; Human Immunodeficiency Virus; Lymphedema; Sickle Cell Disease Respiratory Medical History: Negative for: Aspiration; Asthma; Chronic Obstructive Pulmonary Disease (COPD); Pneumothorax; Tuberculosis Cardiovascular Medical History: Positive for: Hypertension Gastrointestinal Medical History: Negative for: Cirrhosis ; Colitis; Crohns; Hepatitis A; Hepatitis B; Hepatitis C Endocrine Medical History: Positive for: Type II Diabetes Time with diabetes: 2000 Treated with: Insulin Blood sugar tested every day: Yes Tested : Genitourinary Medical History: Negative for: End Stage Renal Disease Past Medical History Notes: CKD IV Immunological Medical History: Negative for: Lupus Erythematosus; Raynauds; Scleroderma Integumentary (Skin) Medical History: Negative for: History of Burn Musculoskeletal Medical History: Positive for: Osteomyelitis - MRI confirmed (08/16/22) Negative for: Gout Neurologic Medical History: Positive for: Neuropathy Psychiatric Medical History: Positive for: Confinement Anxiety Negative for: Anorexia/bulimia HBO Extended History  Items Eyes: Cataracts Immunizations Pneumococcal Vaccine: Received Pneumococcal Vaccination: Yes Received Pneumococcal Vaccination On or After 60th Birthday: Yes Implantable Devices None Hospitalization / Surgery History Type of Hospitalization/Surgery AV fistula place left upper arm 10/20/22 Leavell, Yamir ([id number]) [id number] Page 9 of 9 Family and Social History Cancer: No; Diabetes: Yes - Mother,Siblings; Heart Disease: Yes - Siblings; Hereditary Spherocytosis: No; Hypertension: Yes - Siblings; Kidney Disease: No; Lung Disease: No; Seizures: Yes - Father; Stroke: Yes - Siblings; Thyroid Problems: No; Tuberculosis: No; Former  smoker; Marital Status - Married; Alcohol Use: Rarely; Drug Use: No History; Caffeine Use: Daily; Financial Concerns: No; Food, Clothing or Shelter Needs: No; Support System Lacking: No; Transportation Concerns: No Electronic Signature(s) Signed: 05/18/2023 11:55:54 AM By: [name] MD FACS Entered By: [name] on 05/18/2023 11:25:31 -------------------------------------------------------------------------------- SuperBill Details Patient Name: Date of Service: [name], [name] 05/18/2023 Medical Record Number: [id number] Patient Account Number: [id number] Date of Birth/Sex: Treating RN: 08/02/1951 (72 y.o. Yates Decamp Primary Care Provider: [name] Other Other Clinician: Referring Provider: Treating Provider/Extender: [name] Weeks in Treatment: 31 Diagnosis Coding ICD-10 Codes Code Description [phone] 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.621 Type 2 diabetes mellitus with foot ulcer N18.6 End stage renal disease M86.9 Osteomyelitis, unspecified E11.42 Type 2 diabetes mellitus with diabetic polyneuropathy I10 Essential (primary) hypertension Facility Procedures : CPT4 Code: [id number] Description: 99213 - WOUND CARE VISIT-LEV 3 EST  PT Modifier: Quantity: 1 Physician Procedures : CPT4 Code Description Modifier [id number] 99214 - WC PHYS LEVEL 4 - EST PT ICD-10 Diagnosis Description L97.514 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.621 Type 2 diabetes mellitus with foot ulcer M86.9  Osteomyelitis, unspecified N18.6 End stage renal disease Quantity: 1 Electronic Signature(s) Signed: 05/18/2023 11:28:41 AM By: [name] MD FACS Entered By: [name] on 05/18/2023 11:28:41

## 2023-05-23 ENCOUNTER — Other Ambulatory Visit: Payer: No Typology Code available for payment source | Admitting: Pharmacist

## 2023-05-29 NOTE — Progress Notes (Signed)
[name], [name] ([id number]) [id number] Page 1 of 9 Visit Report for 05/18/2023 Arrival Information Details Patient Name: Date of Service: [place], [name] 05/18/2023 10:45 A M Medical Record Number: [id number] Patient Account Number: [id number] Date of Birth/Sex: Treating RN: 11-23-1950 (72 y.o. [name] Primary Care Ingrid Shifrin: [name] Other Other Clinician: Referring Shihab States: Treating Breylin Dom/Extender: [name] Weeks in Treatment: 31 Visit Information History Since Last Visit All ordered tests and consults were completed: Yes Patient Arrived: Ambulatory Added or deleted any medications: No Arrival Time: 10:51 Any new allergies or adverse reactions: No Accompanied By: self Had a fall or experienced change in No Transfer Assistance: EasyPivot Patient Lift activities of daily living that may affect Patient Identification Verified: Yes risk of falls: Secondary Verification Process Completed: Yes Signs or symptoms of abuse/neglect since last visito No Patient Requires Transmission-Based Precautions: No Hospitalized since last visit: No Patient Has Alerts: No Implantable device outside of the clinic excluding No cellular tissue based products placed in the center since last visit: Has Dressing in Place as Prescribed: Yes Pain Present Now: No Electronic Signature(s) Signed: 05/29/2023 2:03:55 PM By: Brenton Grills Entered By: Brenton Grills on 05/18/2023 10:52:24 -------------------------------------------------------------------------------- Clinic Level of Care Assessment Details Patient Name: Date of Service: [facility] [name], [name] 05/18/2023 10:45 A M Medical Record Number: [id number] Patient Account Number: [id number] Date of Birth/Sex: Treating RN: July 17, 1951 (72 y.o. [name] Primary Care Marili Vader: [name] Other Other Clinician: Referring Mong Neal: Treating Jacon Whetzel/Extender: [name] Weeks in  Treatment: 31 Clinic Level of Care Assessment Items TOOL 4 Quantity Score X- 1 0 Use when only an EandM is performed on FOLLOW-UP visit ASSESSMENTS - Nursing Assessment / Reassessment X- 1 10 Reassessment of Co-morbidities (includes updates in patient status) X- 1 5 Reassessment of Adherence to Treatment Plan ASSESSMENTS - Wound and Skin A ssessment / Reassessment X - Simple Wound Assessment / Reassessment - one wound 1 5 X- 1 5 Complex Wound Assessment / Reassessment - multiple wounds X- 1 10 Dermatologic / Skin Assessment (not related to wound area) ASSESSMENTS - Focused Assessment X- 1 5 Circumferential Edema Measurements - multi extremities []  - 0 Nutritional Assessment / Counseling / Intervention [name], [name] ([id number]) [id number] Page 2 of 9 []  - 0 Lower Extremity Assessment (monofilament, tuning fork, pulses) []  - 0 Peripheral Arterial Disease Assessment (using hand held doppler) ASSESSMENTS - Ostomy and/or Continence Assessment and Care []  - 0 Incontinence Assessment and Management []  - 0 Ostomy Care Assessment and Management (repouching, etc.) PROCESS - Coordination of Care X - Simple Patient / Family Education for ongoing care 1 15 []  - 0 Complex (extensive) Patient / Family Education for ongoing care X- 1 10 Staff obtains Chiropractor, Records, T Results / Process Orders est []  - 0 Staff telephones HHA, Nursing Homes / Clarify orders / etc []  - 0 Routine Transfer to another Facility (non-emergent condition) []  - 0 Routine Hospital Admission (non-emergent condition) []  - 0 New Admissions / Manufacturing engineer / Ordering NPWT Apligraf, etc. , []  - 0 Emergency Hospital Admission (emergent condition) X- 1 10 Simple Discharge Coordination []  - 0 Complex (extensive) Discharge Coordination PROCESS - Special Needs []  - 0 Pediatric / Minor Patient Management []  - 0 Isolation Patient Management []  - 0 Hearing / Language /  Visual special needs []  - 0 Assessment of Community assistance (transportation, D/C planning, etc.) []  - 0 Additional assistance / Altered mentation []  - 0 Support Surface(s) Assessment (bed, cushion, seat, etc.) INTERVENTIONS - Wound  Cleansing / Measurement X - Simple Wound Cleansing - one wound 1 5 []  - 0 Complex Wound Cleansing - multiple wounds X- 1 5 Wound Imaging (photographs - any number of wounds) []  - 0 Wound Tracing (instead of photographs) X- 1 5 Simple Wound Measurement - one wound []  - 0 Complex Wound Measurement - multiple wounds INTERVENTIONS - Wound Dressings X - Small Wound Dressing one or multiple wounds 1 10 []  - 0 Medium Wound Dressing one or multiple wounds []  - 0 Large Wound Dressing one or multiple wounds X- 1 5 Application of Medications - topical []  - 0 Application of Medications - injection INTERVENTIONS - Miscellaneous []  - 0 External ear exam []  - 0 Specimen Collection (cultures, biopsies, blood, body fluids, etc.) []  - 0 Specimen(s) / Culture(s) sent or taken to Lab for analysis []  - 0 Patient Transfer (multiple staff / Nurse, adult / Similar devices) []  - 0 Simple Staple / Suture removal (25 or less) []  - 0 Complex Staple / Suture removal (26 or more) []  - 0 Hypo / Hyperglycemic Management (close monitor of Blood Glucose) [name], [name] ([id number]) [id number] Page 3 of 9 []  - 0 Ankle / Brachial Index (ABI) - do not check if billed separately X- 1 5 Vital Signs Has the patient been seen at the hospital within the last three years: Yes Total Score: 110 Level Of Care: New/Established - Level 3 Electronic Signature(s) Signed: 05/29/2023 2:03:55 PM By: Brenton Grills Entered By: Brenton Grills on 05/18/2023 11:18:13 -------------------------------------------------------------------------------- Encounter Discharge Information Details Patient Name: Date of Service: [facility], [name] 05/18/2023 10:45 A M Medical  Record Number: [id number] Patient Account Number: [id number] Date of Birth/Sex: Treating RN: 07-22-1951 (72 y.o. [name] Primary Care Libby Goehring: [name] Other Other Clinician: Referring Tayloranne Lekas: Treating Gatlyn Lipari/Extender: [name] Weeks in Treatment: 31 Encounter Discharge Information Items Discharge Condition: Stable Ambulatory Status: Ambulatory Discharge Destination: Home Transportation: Private Auto Accompanied By: self Schedule Follow-up Appointment: Yes Clinical Summary of Care: Patient Declined Electronic Signature(s) Signed: 05/29/2023 2:03:55 PM By: Brenton Grills Entered By: Brenton Grills on 05/18/2023 11:19:19 -------------------------------------------------------------------------------- Lower Extremity Assessment Details Patient Name: Date of Service: [facility], [name] 05/18/2023 10:45 A M Medical Record Number: [id number] Patient Account Number: [id number] Date of Birth/Sex: Treating RN: 03-23-1951 (72 y.o. [name] Primary Care Sabastion Hrdlicka: [name] Other Other Clinician: Referring Morrissa Shein: Treating Celso Granja/Extender: [name] Weeks in Treatment: 31 Edema Assessment Assessed: [Left: No] [Right: No] Edema: [Left: N] [Right: o] Calf Left: Right: Point of Measurement: From Medial Instep 36.2 cm Ankle Left: Right: Point of Measurement: From Medial Instep 23 cm Vascular Assessment Left: [[id number] Page 4 of 9Right:] Pulses: Dorsalis Pedis Palpable: [[id number] Page 4 of 9Yes] Extremity colors, hair growth, and conditions: Hair Growth on Extremity: [[id number] Page 4 of 9Yes] Temperature of Extremity: [[id number].pdf Page 4 of 9Warm] Capillary Refill: [[id number] Page 4 of 9< 3 seconds] Dependent Rubor: [[id number] Page 4 of 9No No] Toe Nail  Assessment Left: Right: Thick: No Discolored: No Deformed: No Improper Length and Hygiene: No Electronic Signature(s) Signed: 05/29/2023 2:03:55 PM By: Brenton Grills Entered By: Brenton Grills on 05/18/2023 10:55:41 -------------------------------------------------------------------------------- Multi Wound Chart Details Patient Name: Date of Service: [facility], [name] 05/18/2023 10:45 A M Medical Record Number: [id number] Patient Account Number: [id number] Date of Birth/Sex: Treating RN: 1951-07-28 (72 y.o. M) Primary Care Avacyn Kloosterman: [name] Other Other Clinician: Referring Keante Urizar: Treating Dawnna Gritz/Extender: [name] Weeks in Treatment: 31 Vital Signs Height(in): 72 Pulse(bpm): 96  Weight(lbs): 185 Blood Pressure(mmHg): 145/67 Body Mass Index(BMI): 25.1 Temperature(F): 98.1 Respiratory Rate(breaths/min): 18 [1:Photos:] [N/A:N/A] Right, Medial T Great oe N/A N/A Wound Location: Pressure Injury N/A N/A Wounding Event: Diabetic Wound/Ulcer of the Lower N/A N/A Primary Etiology: Extremity Acute Osteomyelitis N/A N/A Secondary Etiology: Cataracts, Hypertension, Type II N/A N/A Comorbid History: Diabetes, Osteomyelitis, Neuropathy, Confinement Anxiety 08/16/2022 N/A N/A Date Acquired: 40 N/A N/A Weeks of Treatment: Open N/A N/A Wound Status: No N/A N/A Wound Recurrence: 0.2x0.2x0.1 N/A N/A Measurements L x W x D (cm) 0.031 N/A N/A A (cm) : rea 0.003 N/A N/A Volume (cm) : 88.70% N/A N/A % Reduction in A rea: 97.30% N/A N/A % Reduction in Volume: Grade 3 N/A N/A Classification: Small N/A N/A Exudate A mountGeraldine [name], [name] ([id number]) [id number] Page 5 of 9 Serosanguineous N/A N/A Exudate Type: red, brown N/A N/A Exudate Color: Distinct, outline attached N/A N/A Wound Margin: Large (67-100%) N/A N/A Granulation Amount: Red N/A N/A Granulation Quality: Small (1-33%) N/A N/A Necrotic Amount: Fat Layer  (Subcutaneous Tissue): Yes N/A N/A Exposed Structures: Bone: Yes Fascia: No Tendon: No Muscle: No Joint: No Medium (34-66%) N/A N/A Epithelialization: Callus: Yes N/A N/A Periwound Skin Texture: Maceration: Yes N/A N/A Periwound Skin Moisture: No Abnormalities Noted N/A N/A Periwound Skin Color: No Abnormality N/A N/A Temperature: Treatment Notes Wound #1 (Toe Great) Wound Laterality: Right, Medial Cleanser Soap and Water Discharge Instruction: May shower and wash wound with dial antibacterial soap and water prior to dressing change. Wound Cleanser Discharge Instruction: Cleanse the wound with wound cleanser prior to applying a clean dressing using gauze sponges, not tissue or cotton balls. Peri-Wound Care Topical Gentamicin Discharge Instruction: Apply with Mupirocin (Mix Mupirocin and Getamicin ointment) Mupirocin Ointment Discharge Instruction: Apply Mupirocin (Bactroban) with Gentamicinas instructed Primary Dressing Promogran Prisma Matrix, 4.34 (sq in) (silver collagen) Discharge Instruction: Moisten collagen with saline or hydrogel Secondary Dressing Woven Gauze Sponges 2x2 in Discharge Instruction: Apply over primary dressing as directed. Secured With Conforming Stretch Gauze Bandage, Sterile 2x75 (in/in) Discharge Instruction: Secure with stretch gauze as directed. 43M Medipore Soft Cloth Surgical T 2x10 (in/yd) ape Discharge Instruction: Secure with tape as directed. Compression Wrap Compression Stockings Add-Ons Electronic Signature(s) Signed: 05/18/2023 11:23:32 AM By: [name] MD FACS Entered By: [name] on 05/18/2023 11:23:32 -------------------------------------------------------------------------------- Multi-Disciplinary Care Plan Details Patient Name: Date of Service: [facility], [name] 05/18/2023 10:45 A M Medical Record Number: [id number] Patient Account Number: [id number] Date of Birth/Sex: Treating RN: 07-14-1951 (72 y.o. [name] Primary Care Caileigh Canche: [name] Other Other Clinician: Referring Quinetta Shilling: Treating Langley Flatley/Extender: [name] [place], [name] ([id number]) [id number] Page 6 of 9 Weeks in Treatment: 31 Multidisciplinary Care Plan reviewed with physician Active Inactive Nutrition Nursing Diagnoses: Impaired glucose control: actual or potential Goals: Patient/caregiver will maintain therapeutic glucose control Date Initiated: 10/12/2022 Target Resolution Date: 06/02/2023 Goal Status: Active Interventions: Provide education on elevated blood sugars and impact on wound healing Provide education on nutrition Treatment Activities: Dietary management education, guidance and counseling : 10/12/2022 Giving encouragement to exercise : 10/12/2022 Notes: Osteomyelitis Nursing Diagnoses: Infection: osteomyelitis Goals: Patient/caregiver will verbalize understanding of disease process and disease management Date Initiated: 10/12/2022 Target Resolution Date: 06/02/2023 Goal Status: Active Interventions: Assess for signs and symptoms of osteomyelitis resolution every visit Provide education on osteomyelitis Treatment Activities: MRI : 08/16/2022 Notes: Electronic Signature(s) Signed: 05/29/2023 2:03:55 PM By: Brenton Grills Entered By: Brenton Grills on 05/18/2023 11:01:05 -------------------------------------------------------------------------------- Pain Assessment Details Patient Name: Date of Service: [name], [place]  [name] 05/18/2023 10:45 A M Medical Record Number: [id number] Patient Account Number: [id number] Date of Birth/Sex: Treating RN: 1950/12/18 (72 y.o. [name] Primary Care Yan Pankratz: [name] Other Other Clinician: Referring Jayvien Rowlette: Treating Safa Derner/Extender: [name] Weeks in Treatment: 31 Active Problems Location of Pain Severity and Description of Pain Patient Has Paino No Site Locations [place], [name]  ([id number]) [id number] Page 7 of 9 Pain Management and Medication Current Pain Management: Electronic Signature(s) Signed: 05/29/2023 2:03:55 PM By: Brenton Grills Entered By: Brenton Grills on 05/18/2023 10:54:12 -------------------------------------------------------------------------------- Patient/Caregiver Education Details Patient Name: Date of Service: [name], [name] 8/16/2024andnbsp10:45 A M Medical Record Number: [id number] Patient Account Number: [id number] Date of Birth/Gender: Treating RN: 1950-12-24 (72 y.o. [name] Primary Care Physician: [name] Other Other Clinician: Referring Physician: Treating Physician/Extender: Gracy Racer in Treatment: 31 Education Assessment Education Provided To: Patient Education Topics Provided Wound/Skin Impairment: Methods: Explain/Verbal Responses: State content correctly Electronic Signature(s) Signed: 05/29/2023 2:03:55 PM By: Brenton Grills Entered By: Brenton Grills on 05/18/2023 11:01:25 -------------------------------------------------------------------------------- Wound Assessment Details Patient Name: Date of Service: [facility], [name] 05/18/2023 10:45 A M Medical Record Number: [id number] Patient Account Number: [id number] Date of Birth/Sex: Treating RN: 07-31-1951 (72 y.o. [name] Primary Care Izen Petz: [name] Other Other Clinician: Referring Kjuan Seipp: Treating Maryiah Olvey/Extender: [name] [place], [name] ([id number]) [id number] Page 8 of 9 Weeks in Treatment: 31 Wound Status Wound Number: 1 Primary Diabetic Wound/Ulcer of the Lower Extremity Etiology: Wound Location: Right, Medial T Great oe Secondary Acute Osteomyelitis Wounding Event: Pressure Injury Etiology: Date Acquired: 08/16/2022 Wound Open Weeks Of Treatment: 31 Status: Clustered Wound: No Comorbid Cataracts, Hypertension, Type II Diabetes,  Osteomyelitis, History: Neuropathy, Confinement Anxiety Photos Wound Measurements Length: (cm) 0.2 Width: (cm) 0.2 Depth: (cm) 0.1 Area: (cm) 0.031 Volume: (cm) 0.003 % Reduction in Area: 88.7% % Reduction in Volume: 97.3% Epithelialization: Medium (34-66%) Tunneling: No Undermining: No Wound Description Classification: Grade 3 Wound Margin: Distinct, outline attached Exudate Amount: Small Exudate Type: Serosanguineous Exudate Color: red, brown Foul Odor After Cleansing: No Slough/Fibrino Yes Wound Bed Granulation Amount: Large (67-100%) Exposed Structure Granulation Quality: Red Fascia Exposed: No Necrotic Amount: Small (1-33%) Fat Layer (Subcutaneous Tissue) Exposed: Yes Necrotic Quality: Adherent Slough Tendon Exposed: No Muscle Exposed: No Joint Exposed: No Bone Exposed: Yes Periwound Skin Texture Texture Color No Abnormalities Noted: No No Abnormalities Noted: Yes Callus: Yes Temperature / Pain Temperature: No Abnormality Moisture No Abnormalities Noted: Yes Treatment Notes Wound #1 (Toe Great) Wound Laterality: Right, Medial Cleanser Soap and Water Discharge Instruction: May shower and wash wound with dial antibacterial soap and water prior to dressing change. Wound Cleanser Discharge Instruction: Cleanse the wound with wound cleanser prior to applying a clean dressing using gauze sponges, not tissue or cotton balls. Peri-Wound Care Topical Gentamicin Discharge Instruction: Apply with Mupirocin (Mix Mupirocin and Getamicin ointment) Mupirocin Ointment Discharge Instruction: Apply Mupirocin (Bactroban) with Gentamicinas instructed BROWDER, Vanden ([id number]) [id number] Page 9 of 9 Primary Dressing Promogran Prisma Matrix, 4.34 (sq in) (silver collagen) Discharge Instruction: Moisten collagen with saline or hydrogel Secondary Dressing Woven Gauze Sponges 2x2 in Discharge Instruction: Apply over primary dressing as  directed. Secured With Conforming Stretch Gauze Bandage, Sterile 2x75 (in/in) Discharge Instruction: Secure with stretch gauze as directed. 91M Medipore Soft Cloth Surgical T 2x10 (in/yd) ape Discharge Instruction: Secure with tape as directed. Compression Wrap Compression Stockings Add-Ons Electronic Signature(s) Signed: 05/29/2023 2:03:55 PM By: Brenton Grills Entered By: Brenton Grills on 05/18/2023 10:58:35 -------------------------------------------------------------------------------- Vitals Details  Patient Name: Date of Service[place]ise [name], [name] 05/18/2023 10:45 A M Medical Record Number: [id number] Patient Account Number: [id number] Date of Birth/Sex: Treating RN: 01/03/1951 (72 y.o. [name] Primary Care Vivian Neuwirth: [name] Other Other Clinician: Referring Karsten Howry: Treating Charlean Carneal/Extender: [name] Weeks in Treatment: 31 Vital Signs Time Taken: 10:52 Temperature (F): 98.1 Height (in): 72 Pulse (bpm): 96 Weight (lbs): 185 Respiratory Rate (breaths/min): 18 Body Mass Index (BMI): 25.1 Blood Pressure (mmHg): 145/67 Reference Range: 80 - 120 mg / dl Electronic Signature(s) Signed: 05/29/2023 2:03:55 PM By: Brenton Grills Entered By: Brenton Grills on 05/18/2023 10:54:03

## 2023-05-30 ENCOUNTER — Other Ambulatory Visit: Payer: Self-pay

## 2023-05-30 ENCOUNTER — Inpatient Hospital Stay (HOSPITAL_COMMUNITY)
Admission: EM | Admit: 2023-05-30 | Discharge: 2023-06-03 | DRG: 064 | Disposition: A | Discharge status: Home Health | Payer: No Typology Code available for payment source | Attending: Family Medicine | Admitting: Family Medicine

## 2023-05-30 ENCOUNTER — Emergency Department (HOSPITAL_COMMUNITY): Payer: No Typology Code available for payment source

## 2023-05-30 DIAGNOSIS — Z888 Allergy status to other drugs, medicaments and biological substances status: Secondary | ICD-10-CM

## 2023-05-30 DIAGNOSIS — E782 Mixed hyperlipidemia: Secondary | ICD-10-CM | POA: Diagnosis present

## 2023-05-30 DIAGNOSIS — Z9049 Acquired absence of other specified parts of digestive tract: Secondary | ICD-10-CM

## 2023-05-30 DIAGNOSIS — K219 Gastro-esophageal reflux disease without esophagitis: Secondary | ICD-10-CM | POA: Diagnosis present

## 2023-05-30 DIAGNOSIS — H538 Other visual disturbances: Secondary | ICD-10-CM | POA: Diagnosis present

## 2023-05-30 DIAGNOSIS — Z794 Long term (current) use of insulin: Secondary | ICD-10-CM

## 2023-05-30 DIAGNOSIS — I63431 Cerebral infarction due to embolism of right posterior cerebral artery: Principal | ICD-10-CM | POA: Diagnosis present

## 2023-05-30 DIAGNOSIS — R509 Fever, unspecified: Secondary | ICD-10-CM | POA: Diagnosis not present

## 2023-05-30 DIAGNOSIS — E1165 Type 2 diabetes mellitus with hyperglycemia: Secondary | ICD-10-CM | POA: Diagnosis present

## 2023-05-30 DIAGNOSIS — I6782 Cerebral ischemia: Secondary | ICD-10-CM | POA: Diagnosis not present

## 2023-05-30 DIAGNOSIS — I639 Cerebral infarction, unspecified: Secondary | ICD-10-CM | POA: Diagnosis present

## 2023-05-30 DIAGNOSIS — E1122 Type 2 diabetes mellitus with diabetic chronic kidney disease: Secondary | ICD-10-CM | POA: Diagnosis present

## 2023-05-30 DIAGNOSIS — I6523 Occlusion and stenosis of bilateral carotid arteries: Secondary | ICD-10-CM | POA: Diagnosis not present

## 2023-05-30 DIAGNOSIS — Z79899 Other long term (current) drug therapy: Secondary | ICD-10-CM

## 2023-05-30 DIAGNOSIS — H53462 Homonymous bilateral field defects, left side: Secondary | ICD-10-CM | POA: Diagnosis present

## 2023-05-30 DIAGNOSIS — Z992 Dependence on renal dialysis: Secondary | ICD-10-CM

## 2023-05-30 DIAGNOSIS — Z87891 Personal history of nicotine dependence: Secondary | ICD-10-CM

## 2023-05-30 DIAGNOSIS — N186 End stage renal disease: Secondary | ICD-10-CM | POA: Diagnosis present

## 2023-05-30 DIAGNOSIS — Z8673 Personal history of transient ischemic attack (TIA), and cerebral infarction without residual deficits: Secondary | ICD-10-CM

## 2023-05-30 DIAGNOSIS — R519 Headache, unspecified: Secondary | ICD-10-CM | POA: Diagnosis not present

## 2023-05-30 DIAGNOSIS — N2581 Secondary hyperparathyroidism of renal origin: Secondary | ICD-10-CM | POA: Diagnosis present

## 2023-05-30 DIAGNOSIS — R29703 NIHSS score 3: Secondary | ICD-10-CM | POA: Diagnosis present

## 2023-05-30 DIAGNOSIS — E871 Hypo-osmolality and hyponatremia: Secondary | ICD-10-CM | POA: Diagnosis present

## 2023-05-30 DIAGNOSIS — E1142 Type 2 diabetes mellitus with diabetic polyneuropathy: Secondary | ICD-10-CM | POA: Diagnosis present

## 2023-05-30 DIAGNOSIS — Z87768 Personal history of other specified (corrected) congenital malformations of integument, limbs and musculoskeletal system: Secondary | ICD-10-CM

## 2023-05-30 DIAGNOSIS — Z7982 Long term (current) use of aspirin: Secondary | ICD-10-CM

## 2023-05-30 DIAGNOSIS — I10 Essential (primary) hypertension: Secondary | ICD-10-CM | POA: Diagnosis present

## 2023-05-30 DIAGNOSIS — I120 Hypertensive chronic kidney disease with stage 5 chronic kidney disease or end stage renal disease: Secondary | ICD-10-CM | POA: Diagnosis present

## 2023-05-30 LAB — COMPREHENSIVE METABOLIC PANEL
ALT: 20 U/L (ref 0–44)
AST: 17 U/L (ref 15–41)
Albumin: 3.5 g/dL (ref 3.5–5.0)
Alkaline Phosphatase: 74 U/L (ref 38–126)
Anion gap: 14 (ref 5–15)
BUN: 50 mg/dL — ABNORMAL HIGH (ref 8–23)
CO2: 26 mmol/L (ref 22–32)
Calcium: 8.1 mg/dL — ABNORMAL LOW (ref 8.9–10.3)
Chloride: 96 mmol/L — ABNORMAL LOW (ref 98–111)
Creatinine, Ser: 7.2 mg/dL — ABNORMAL HIGH (ref 0.61–1.24)
GFR, Estimated: 8 mL/min — ABNORMAL LOW (ref >60)
Glucose, Bld: 269 mg/dL — ABNORMAL HIGH (ref 70–99)
Potassium: 3.8 mmol/L (ref 3.5–5.1)
Sodium: 136 mmol/L (ref 135–145)
Total Bilirubin: 0.4 mg/dL (ref 0.3–1.2)
Total Protein: 7.6 g/dL (ref 6.5–8.1)

## 2023-05-30 LAB — CBC WITH DIFFERENTIAL/PLATELET
Abs Immature Granulocytes: 0.03 10*3/uL (ref 0.00–0.07)
Basophils Absolute: 0 10*3/uL (ref 0.0–0.1)
Basophils Relative: 0 %
Eosinophils Absolute: 0.2 10*3/uL (ref 0.0–0.5)
Eosinophils Relative: 2 %
HCT: 37.6 % — ABNORMAL LOW (ref 39.0–52.0)
Hemoglobin: 12.3 g/dL — ABNORMAL LOW (ref 13.0–17.0)
Immature Granulocytes: 0 %
Lymphocytes Relative: 26 %
Lymphs Abs: 2.2 10*3/uL (ref 0.7–4.0)
MCH: 29.9 pg (ref 26.0–34.0)
MCHC: 32.7 g/dL (ref 30.0–36.0)
MCV: 91.5 fL (ref 80.0–100.0)
Monocytes Absolute: 1.1 10*3/uL — ABNORMAL HIGH (ref 0.1–1.0)
Monocytes Relative: 14 %
Neutro Abs: 4.8 10*3/uL (ref 1.7–7.7)
Neutrophils Relative %: 58 %
Platelets: 171 10*3/uL (ref 150–400)
RBC: 4.11 MIL/uL — ABNORMAL LOW (ref 4.22–5.81)
RDW: 15.3 % (ref 11.5–15.5)
WBC: 8.3 10*3/uL (ref 4.0–10.5)
nRBC: 0 % (ref 0.0–0.2)

## 2023-05-30 LAB — PROTIME-INR
INR: 1 (ref 0.8–1.2)
Prothrombin Time: 13.7 seconds (ref 11.4–15.2)

## 2023-05-30 LAB — ETHANOL: Alcohol, Ethyl (B): <10 mg/dL (ref <10)

## 2023-05-30 LAB — APTT: aPTT: 29 seconds (ref 24–36)

## 2023-05-30 LAB — CBG MONITORING, ED: Glucose-Capillary: 285 mg/dL — ABNORMAL HIGH (ref 70–99)

## 2023-05-30 MED ORDER — ACETAMINOPHEN 160 MG/5ML PO SOLN: 650.0000 mg | ORAL | Status: DC | PRN

## 2023-05-30 MED ORDER — ONDANSETRON HCL 4 MG/2ML IJ SOLN: 4.0000 mg | Freq: Three times a day (TID) | INTRAMUSCULAR | Status: DC | PRN

## 2023-05-30 MED ORDER — CALCITRIOL 0.25 MCG PO CAPS
0.2500 ug | ORAL_CAPSULE | Freq: Every day | ORAL | Status: DC
  Administered 2023-05-31 – 2023-06-02 (×3): 0.25 ug via ORAL
  Filled 2023-05-30 (×4): qty 1

## 2023-05-30 MED ORDER — HEPARIN SODIUM (PORCINE) 5000 UNIT/ML IJ SOLN
5000.0000 [IU] | Freq: Three times a day (TID) | INTRAMUSCULAR | Status: DC
  Administered 2023-05-31 – 2023-06-03 (×9): 5000 [IU] via SUBCUTANEOUS
  Filled 2023-05-30 (×10): qty 1

## 2023-05-30 MED ORDER — HEPARIN SODIUM (PORCINE) 5000 UNIT/ML IJ SOLN
5000.0000 [IU] | Freq: Three times a day (TID) | INTRAMUSCULAR | Status: DC
  Filled 2023-05-30: qty 1

## 2023-05-30 MED ORDER — CARVEDILOL 3.125 MG PO TABS
6.2500 mg | ORAL_TABLET | Freq: Two times a day (BID) | ORAL | Status: DC
  Administered 2023-05-31 (×2): 6.25 mg via ORAL
  Filled 2023-05-30 (×2): qty 2

## 2023-05-30 MED ORDER — ACETAMINOPHEN 325 MG PO TABS
650.0000 mg | ORAL_TABLET | ORAL | Status: DC | PRN
  Administered 2023-05-31 – 2023-06-03 (×12): 650 mg via ORAL
  Filled 2023-05-30 (×12): qty 2

## 2023-05-30 MED ORDER — STROKE: EARLY STAGES OF RECOVERY BOOK
Freq: Once | Status: AC
  Filled 2023-05-30: qty 1

## 2023-05-30 MED ORDER — AMLODIPINE BESYLATE 5 MG PO TABS
10.0000 mg | ORAL_TABLET | Freq: Every day | ORAL | Status: DC
  Administered 2023-05-31: 10 mg via ORAL
  Filled 2023-05-30 (×2): qty 2

## 2023-05-30 MED ORDER — ACETAMINOPHEN 650 MG RE SUPP: 650.0000 mg | RECTAL | Status: DC | PRN

## 2023-05-30 MED ORDER — ASPIRIN 81 MG PO TBEC
81.0000 mg | DELAYED_RELEASE_TABLET | Freq: Every day | ORAL | Status: DC
  Administered 2023-05-31 – 2023-06-03 (×4): 81 mg via ORAL
  Filled 2023-05-30 (×4): qty 1

## 2023-05-30 MED ORDER — SENNOSIDES-DOCUSATE SODIUM 8.6-50 MG PO TABS: 1.0000 | ORAL_TABLET | Freq: Every evening | ORAL | Status: DC | PRN

## 2023-05-30 MED ORDER — ATORVASTATIN CALCIUM 40 MG PO TABS
80.0000 mg | ORAL_TABLET | Freq: Every day | ORAL | Status: DC
  Administered 2023-05-31 – 2023-06-02 (×4): 80 mg via ORAL
  Filled 2023-05-30 (×4): qty 2

## 2023-05-30 NOTE — ED Provider Notes (Signed)
[place] EMERGENCY DEPARTMENT AT [facility] Provider Note   CSN: [id number] Arrival date & time: 05/30/23  1751     History  Chief Complaint  Patient presents with   Headache   Loss of Vision    [name] is a 72 y.o. male.   Headache Patient presents with blurred vision dull headache since yesterday.  After dialysis yesterday around 1030 began to feel weak.  States had some trouble seeing.  States he feels if he is having pink trouble with his peripheral vision.  States is improved since yesterday.  Has been able to ambulate without difficulty.  Reportedly had a previous stroke but no baseline deficits.    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 Authorizing Provider  amLODipine (NORVASC) 10 MG tablet Take 1 tablet (10 mg total) by mouth daily in the afternoon. 03/22/23   Tommie Sams, DO  aspirin EC 81 MG tablet Take 81 mg by mouth daily. Swallow whole.    [provider]  atorvastatin (LIPITOR) 80 MG tablet Take 1 tablet (80 mg total) by mouth daily in the afternoon. 03/22/23   Tommie Sams, DO  calcitRIOL (ROCALTROL) 0.25 MCG capsule Take 0.25 mcg by mouth daily in the afternoon. 05/22/22   [provider]  carvedilol (COREG)  6.25 MG tablet Take 1 tablet (6.25 mg total) by mouth 2 (two) times daily. 03/29/23   Tommie Sams, DO  Continuous Glucose Sensor (FREESTYLE LIBRE 2 SENSOR) MISC USE TO CHECK BLOOD GLUCOSE CONTINUOUSLY. CHANGE SENSOR EVERY 14 DAYS 04/11/23   Tommie Sams, DO  docusate sodium (COLACE) 100 MG capsule Take 100 mg by mouth daily.    [provider]  Dulaglutide 4.5 MG/0.5ML SOPN Inject 4.5 mg into the skin every Sunday. 05/20/23   Cook, Jayce G, DO  NOVOLOG MIX 70/30 FLEXPEN (70-30) 100 UNIT/ML FlexPen Inject 20 Units into the skin 2 (two) times daily with a meal. 05/13/23   Cook, Dorie Rank G, DO  torsemide (DEMADEX) 20 MG tablet Take 20 mg by mouth daily in the afternoon.    [provider]      Allergies    Losartan potassium and Sildenafil    Review of Systems   Review of Systems  Neurological:  Positive for headaches.    Physical Exam Updated Vital Signs BP (!) 145/61   Pulse 77   Temp 99.7 F (37.6 C) (Oral)   Resp 18   SpO2 98%  Physical Exam Vitals and nursing note reviewed.  HENT:     Head: Atraumatic.  Eyes:     Pupils: Pupils are equal, round, and reactive to light.  Cardiovascular:  Rate and Rhythm: Regular rhythm.  Skin:    General: Skin is warm.  Neurological:     Mental Status: He is alert and oriented to person, place, and time.     Comments: Good grip strength in upper extremities.  Good strength in upper extremities.  However appears to have visual field deficits on bilateral left visual fields.  Good strength in lower extremities awake and appropriate.     ED Results / Procedures / Treatments   Labs (all labs ordered are listed, but only abnormal results are displayed) Labs Reviewed  CBC WITH DIFFERENTIAL/PLATELET - Abnormal; Notable for the following components:      Result Value   RBC 4.11 (*)    Hemoglobin 12.3 (*)    HCT 37.6 (*)    Monocytes Absolute 1.1 (*)    All other components within normal limits  COMPREHENSIVE METABOLIC PANEL  - Abnormal; Notable for the following components:   Chloride 96 (*)    Glucose, Bld 269 (*)    BUN 50 (*)    Creatinine, Ser 7.20 (*)    Calcium 8.1 (*)    GFR, Estimated 8 (*)    All other components within normal limits  CBG MONITORING, ED - Abnormal; Notable for the following components:   Glucose-Capillary 285 (*)    All other components within normal limits  ETHANOL  PROTIME-INR  APTT  CBG MONITORING, ED    EKG None  Radiology CT Head Wo Contrast  Addendum Date: 05/30/2023   ADDENDUM REPORT: 05/30/2023 22:08 ADDENDUM: These results were called by telephone at the time of interpretation on 05/30/2023 at 10:02 pm to provider Benjiman Core , who verbally acknowledged these results. Electronically Signed   By: Darliss Cheney M.D.   On: 05/30/2023 22:08   Result Date: 05/30/2023 CLINICAL DATA:  Acute stroke suspected. Headache and blurred vision. EXAM: CT HEAD WITHOUT CONTRAST TECHNIQUE: Contiguous axial images were obtained from the base of the skull through the vertex without intravenous contrast. RADIATION DOSE REDUCTION: This exam was performed according to the departmental dose-optimization program which includes automated exposure control, adjustment of the mA and/or kV according to patient size and/or use of iterative reconstruction technique. COMPARISON:  Head CT 09/03/2010 FINDINGS: Brain: There is hypodensity with loss of gray-white matter distinction and sulcal effacement in the posterior right occipital lobe compatible with acute infarct. There is no midline shift. There is no acute intracranial hemorrhage or acute extra-axial fluid collection. Brain volume is age-appropriate. There is moderate periventricular and deep white matter hypodensity which has increased compared to 2017. Vascular: Atherosclerotic calcifications are present within the cavernous internal carotid arteries. Skull: Normal. Negative for fracture or focal lesion. Sinuses/Orbits: No acute finding. Other: None.  IMPRESSION: 1. Acute infarct in the right occipital lobe. No acute intracranial hemorrhage. 2. Moderate chronic small vessel ischemic changes, increased compared to 2017. Electronically Signed: By: Darliss Cheney M.D. On: 05/30/2023 21:56    Procedures Procedures    Medications Ordered in ED Medications - No data to display  ED Course/ Medical Decision Making/ A&P                                 Medical Decision Making Amount and/or Complexity of Data Reviewed Labs: ordered. Radiology: ordered.   Patient with visual field deficits.  Last normal was yesterday.  Not a TNK candidate due to the timing.  However does have visual field cut.  Head  CT independent interpreted does show occipital stroke.  Also discussed with radiologist.  He is rather newly on dialysis.  Vessel imaging not done at this time and MRI not available.  Will discuss with hospitalist for admission.           Final Clinical Impression(s) / ED Diagnoses Final diagnoses:  Cerebrovascular accident (CVA), unspecified mechanism [facility])    Rx / DC Orders ED Discharge Orders     None         Benjiman Core, MD 05/30/23 2254

## 2023-05-30 NOTE — ED Triage Notes (Addendum)
Pt states around 1030 yesterday morning after dialysis felt weak, headache, and blurred vision. Pt states headache and blurred vision are still present but have improved from yesterday. Pt states head hurts worse when leaning down.

## 2023-05-31 ENCOUNTER — Observation Stay (HOSPITAL_COMMUNITY): Payer: No Typology Code available for payment source

## 2023-05-31 ENCOUNTER — Encounter (HOSPITAL_COMMUNITY): Payer: Self-pay | Admitting: Family Medicine

## 2023-05-31 ENCOUNTER — Inpatient Hospital Stay (HOSPITAL_COMMUNITY): Payer: No Typology Code available for payment source

## 2023-05-31 DIAGNOSIS — R29818 Other symptoms and signs involving the nervous system: Secondary | ICD-10-CM | POA: Diagnosis not present

## 2023-05-31 DIAGNOSIS — E1165 Type 2 diabetes mellitus with hyperglycemia: Secondary | ICD-10-CM | POA: Diagnosis not present

## 2023-05-31 DIAGNOSIS — H53462 Homonymous bilateral field defects, left side: Secondary | ICD-10-CM | POA: Diagnosis not present

## 2023-05-31 DIAGNOSIS — Z888 Allergy status to other drugs, medicaments and biological substances status: Secondary | ICD-10-CM | POA: Diagnosis not present

## 2023-05-31 DIAGNOSIS — Z79899 Other long term (current) drug therapy: Secondary | ICD-10-CM | POA: Diagnosis not present

## 2023-05-31 DIAGNOSIS — E782 Mixed hyperlipidemia: Secondary | ICD-10-CM

## 2023-05-31 DIAGNOSIS — I6389 Other cerebral infarction: Secondary | ICD-10-CM

## 2023-05-31 DIAGNOSIS — Z8673 Personal history of transient ischemic attack (TIA), and cerebral infarction without residual deficits: Secondary | ICD-10-CM | POA: Diagnosis not present

## 2023-05-31 DIAGNOSIS — Z87891 Personal history of nicotine dependence: Secondary | ICD-10-CM | POA: Diagnosis not present

## 2023-05-31 DIAGNOSIS — I6523 Occlusion and stenosis of bilateral carotid arteries: Secondary | ICD-10-CM | POA: Diagnosis not present

## 2023-05-31 DIAGNOSIS — K219 Gastro-esophageal reflux disease without esophagitis: Secondary | ICD-10-CM | POA: Diagnosis not present

## 2023-05-31 DIAGNOSIS — E871 Hypo-osmolality and hyponatremia: Secondary | ICD-10-CM | POA: Diagnosis not present

## 2023-05-31 DIAGNOSIS — N2581 Secondary hyperparathyroidism of renal origin: Secondary | ICD-10-CM | POA: Diagnosis not present

## 2023-05-31 DIAGNOSIS — Z87768 Personal history of other specified (corrected) congenital malformations of integument, limbs and musculoskeletal system: Secondary | ICD-10-CM | POA: Diagnosis not present

## 2023-05-31 DIAGNOSIS — I63431 Cerebral infarction due to embolism of right posterior cerebral artery: Secondary | ICD-10-CM | POA: Diagnosis not present

## 2023-05-31 DIAGNOSIS — H538 Other visual disturbances: Secondary | ICD-10-CM | POA: Diagnosis not present

## 2023-05-31 DIAGNOSIS — N186 End stage renal disease: Secondary | ICD-10-CM

## 2023-05-31 DIAGNOSIS — Z992 Dependence on renal dialysis: Secondary | ICD-10-CM | POA: Diagnosis not present

## 2023-05-31 DIAGNOSIS — I120 Hypertensive chronic kidney disease with stage 5 chronic kidney disease or end stage renal disease: Secondary | ICD-10-CM | POA: Diagnosis not present

## 2023-05-31 DIAGNOSIS — Z9049 Acquired absence of other specified parts of digestive tract: Secondary | ICD-10-CM | POA: Diagnosis not present

## 2023-05-31 DIAGNOSIS — I10 Essential (primary) hypertension: Secondary | ICD-10-CM

## 2023-05-31 DIAGNOSIS — R509 Fever, unspecified: Secondary | ICD-10-CM | POA: Diagnosis not present

## 2023-05-31 DIAGNOSIS — I700 Atherosclerosis of aorta: Secondary | ICD-10-CM | POA: Diagnosis not present

## 2023-05-31 DIAGNOSIS — I639 Cerebral infarction, unspecified: Secondary | ICD-10-CM | POA: Diagnosis not present

## 2023-05-31 DIAGNOSIS — E1142 Type 2 diabetes mellitus with diabetic polyneuropathy: Secondary | ICD-10-CM

## 2023-05-31 DIAGNOSIS — R918 Other nonspecific abnormal finding of lung field: Secondary | ICD-10-CM | POA: Diagnosis not present

## 2023-05-31 DIAGNOSIS — I6621 Occlusion and stenosis of right posterior cerebral artery: Secondary | ICD-10-CM | POA: Diagnosis not present

## 2023-05-31 DIAGNOSIS — Z7982 Long term (current) use of aspirin: Secondary | ICD-10-CM | POA: Diagnosis not present

## 2023-05-31 DIAGNOSIS — H547 Unspecified visual loss: Secondary | ICD-10-CM | POA: Diagnosis not present

## 2023-05-31 DIAGNOSIS — R29703 NIHSS score 3: Secondary | ICD-10-CM | POA: Diagnosis not present

## 2023-05-31 DIAGNOSIS — R519 Headache, unspecified: Secondary | ICD-10-CM | POA: Diagnosis not present

## 2023-05-31 DIAGNOSIS — E1122 Type 2 diabetes mellitus with diabetic chronic kidney disease: Secondary | ICD-10-CM | POA: Diagnosis not present

## 2023-05-31 DIAGNOSIS — D631 Anemia in chronic kidney disease: Secondary | ICD-10-CM | POA: Diagnosis not present

## 2023-05-31 DIAGNOSIS — G936 Cerebral edema: Secondary | ICD-10-CM | POA: Diagnosis not present

## 2023-05-31 DIAGNOSIS — N250 Renal osteodystrophy: Secondary | ICD-10-CM | POA: Diagnosis not present

## 2023-05-31 DIAGNOSIS — Z794 Long term (current) use of insulin: Secondary | ICD-10-CM | POA: Diagnosis not present

## 2023-05-31 LAB — CBG MONITORING, ED
Glucose-Capillary: 179 mg/dL — ABNORMAL HIGH (ref 70–99)
Glucose-Capillary: 181 mg/dL — ABNORMAL HIGH (ref 70–99)

## 2023-05-31 LAB — ECHOCARDIOGRAM COMPLETE BUBBLE STUDY
AR max vel: 2.88 cm2
AV Area VTI: 2.72 cm2
AV Area mean vel: 2.81 cm2
AV Mean grad: 4.7 mmHg
AV Peak grad: 9.7 mmHg
Ao pk vel: 1.55 m/s
Area-P 1/2: 4.1 cm2
Calc EF: 48.8 %
Height: 72 in
S' Lateral: 3.1 cm
Single Plane A2C EF: 56.6 %
Single Plane A4C EF: 39.1 %
Weight: 3040 [oz_av]

## 2023-05-31 LAB — COMPREHENSIVE METABOLIC PANEL
ALT: 17 U/L (ref 0–44)
AST: 14 U/L — ABNORMAL LOW (ref 15–41)
Albumin: 3 g/dL — ABNORMAL LOW (ref 3.5–5.0)
Alkaline Phosphatase: 64 U/L (ref 38–126)
Anion gap: 14 (ref 5–15)
BUN: 52 mg/dL — ABNORMAL HIGH (ref 8–23)
CO2: 23 mmol/L (ref 22–32)
Calcium: 7.4 mg/dL — ABNORMAL LOW (ref 8.9–10.3)
Chloride: 97 mmol/L — ABNORMAL LOW (ref 98–111)
Creatinine, Ser: 6.91 mg/dL — ABNORMAL HIGH (ref 0.61–1.24)
GFR, Estimated: 8 mL/min — ABNORMAL LOW (ref >60)
Glucose, Bld: 225 mg/dL — ABNORMAL HIGH (ref 70–99)
Potassium: 3.6 mmol/L (ref 3.5–5.1)
Sodium: 134 mmol/L — ABNORMAL LOW (ref 135–145)
Total Bilirubin: 0.4 mg/dL (ref 0.3–1.2)
Total Protein: 6.5 g/dL (ref 6.5–8.1)

## 2023-05-31 LAB — HEMOGLOBIN A1C
Hgb A1c MFr Bld: 9.1 % — ABNORMAL HIGH (ref 4.8–5.6)
Mean Plasma Glucose: 214.47 mg/dL

## 2023-05-31 LAB — LIPID PANEL
Cholesterol: 95 mg/dL (ref 0–200)
HDL: 21 mg/dL — ABNORMAL LOW (ref >40)
LDL Cholesterol: 11 mg/dL (ref 0–99)
Total CHOL/HDL Ratio: 4.5 ratio
Triglycerides: 315 mg/dL — ABNORMAL HIGH (ref <150)
VLDL: 63 mg/dL — ABNORMAL HIGH (ref 0–40)

## 2023-05-31 LAB — MRSA NEXT GEN BY PCR, NASAL: MRSA by PCR Next Gen: NOT DETECTED

## 2023-05-31 LAB — CBC
HCT: 31.4 % — ABNORMAL LOW (ref 39.0–52.0)
Hemoglobin: 10.3 g/dL — ABNORMAL LOW (ref 13.0–17.0)
MCH: 29.7 pg (ref 26.0–34.0)
MCHC: 32.8 g/dL (ref 30.0–36.0)
MCV: 90.5 fL (ref 80.0–100.0)
Platelets: 163 10*3/uL (ref 150–400)
RBC: 3.47 MIL/uL — ABNORMAL LOW (ref 4.22–5.81)
RDW: 15.2 % (ref 11.5–15.5)
WBC: 9.1 10*3/uL (ref 4.0–10.5)
nRBC: 0 % (ref 0.0–0.2)

## 2023-05-31 LAB — GLUCOSE, CAPILLARY
Glucose-Capillary: 219 mg/dL — ABNORMAL HIGH (ref 70–99)
Glucose-Capillary: 205 mg/dL — ABNORMAL HIGH (ref 70–99)

## 2023-05-31 LAB — HIV ANTIBODY (ROUTINE TESTING W REFLEX): HIV Screen 4th Generation wRfx: NONREACTIVE

## 2023-05-31 MED ORDER — CLOPIDOGREL BISULFATE 75 MG PO TABS
75.0000 mg | ORAL_TABLET | Freq: Every day | ORAL | Status: DC
  Administered 2023-05-31 – 2023-06-03 (×4): 75 mg via ORAL
  Filled 2023-05-31 (×4): qty 1

## 2023-05-31 MED ORDER — INSULIN ASPART 100 UNIT/ML IJ SOLN
0.0000 [IU] | Freq: Three times a day (TID) | INTRAMUSCULAR | Status: DC
  Administered 2023-05-31: 2 [IU] via SUBCUTANEOUS
  Administered 2023-05-31: 3 [IU] via SUBCUTANEOUS
  Administered 2023-06-01: 2 [IU] via SUBCUTANEOUS
  Administered 2023-06-01: 3 [IU] via SUBCUTANEOUS
  Administered 2023-06-01 – 2023-06-02 (×2): 2 [IU] via SUBCUTANEOUS
  Administered 2023-06-02: 5 [IU] via SUBCUTANEOUS
  Administered 2023-06-03: 2 [IU] via SUBCUTANEOUS
  Filled 2023-05-31: qty 1

## 2023-05-31 MED ORDER — OXYCODONE HCL 5 MG PO TABS
5.0000 mg | ORAL_TABLET | Freq: Four times a day (QID) | ORAL | Status: DC | PRN
  Administered 2023-05-31 – 2023-06-03 (×8): 5 mg via ORAL
  Filled 2023-05-31 (×8): qty 1

## 2023-05-31 MED ORDER — INSULIN ASPART 100 UNIT/ML IJ SOLN
0.0000 [IU] | Freq: Every day | INTRAMUSCULAR | Status: DC
  Administered 2023-05-31: 2 [IU] via SUBCUTANEOUS

## 2023-05-31 MED ORDER — LABETALOL HCL 5 MG/ML IV SOLN: 5.0000 mg | INTRAVENOUS | Status: DC | PRN

## 2023-05-31 NOTE — Assessment & Plan Note (Signed)
-   Tuesday Thursday Saturday dialysis - Consult nephrology - Continue to monitor

## 2023-05-31 NOTE — Progress Notes (Signed)
SLP Cancellation Note  Patient Details Name: [name] MRN: [id number] DOB: 1951-02-19   Cancelled treatment:       Reason Eval/Treat Not Completed: Other (comment) (Pt in with echo; will check back as schedule permits for SLE.)  Thank you,  Havery Moros, CCC-SLP [phone]  Claira Jeter 05/31/2023, 4:40 PM

## 2023-05-31 NOTE — Inpatient Diabetes Management (Addendum)
Inpatient Diabetes Program Recommendations  AACE/ADA: New Consensus Statement on Inpatient Glycemic Control   Target Ranges:  Prepandial:   less than 140 mg/dL      Peak postprandial:   less than 180 mg/dL (1-2 hours)      Critically ill patients:  140 - 180 mg/dL    Latest Reference Range & Units 05/30/23 18:52 05/31/23 08:03  Glucose-Capillary 70 - 99 mg/dL 914 (H) 782 (H)    Latest Reference Range & Units 05/30/23 19:07  Glucose 70 - 99 mg/dL 956 (H)    Latest Reference Range & Units 11/10/22 14:06 05/30/23 19:07  Hemoglobin A1C 4.8 - 5.6 % 6.8 (H) 9.1 (H)   Review of Glycemic Control  Diabetes history: DM2 Outpatient Diabetes medications: 70/30 30 units one to two times a day, Trulicity 4.5 mg Qweek (Sunday); has not had Trulicity in several weeks Current orders for Inpatient glycemic control: Novolog 0-15 units TID with meals, Novolog 0-5 units QHS  Inpatient Diabetes Program Recommendations:    HbgA1C:  A1C 9.1% on 05/30/23 indicating an average glucose of 214 mg/dl over the past 2-3 months. Prior A1C was 6.8% on 11/10/22; anticipate rise in A1C due to being without Trulicity.   Outpatient: At discharge, please provide Rx for FreeStyle Libre2 sensors [phone]).  NOTE: Patient admitted 05/31/23 with CVA. In reviewing chart, noted patient seen his PCP on 05/10/23 and per office note patient had ran out of Trulicity (had been getting it through [employer] patient medication assistance program). Noted telephone note on 05/15/23 from Jeanie Sewer, [name] which notes RPh re-enrolled patient for the medication assistance program but no note in chart since to indicate if patient was able to get mediation assistance again. Will follow up with patient.  Addendum 05/31/23@14 :35-Spoke with patient in ED about diabetes and home regimen for diabetes control. Patient reports taking 70/30 30 units 1-2 times a day as an outpatient for diabetes control.  Patient states he generally takes the 70/30 once a day  because he forgets about taking it again later in the day or he is away from home when he eats supper. Patient confirms that he ran out of Trulicity several weeks ago and his PCP office re-enrolled him to see if he could get Trulicity again through [employer]. Patient states he has not heard anything back from his PCP about whether he was approved or not. Patient reports using FreeStyle Libre2 CGM for glucose monitoring but notes he needs refills for the Libre2.  Discussed A1C results (9.1% on 05/30/23) and explained that current A1C indicates an average glucose of 214 mg/dl over the past 2-3 months. Prior A1C 6.8% in February; patient states his glucose ran much better when he was able to take the Trulicity. Discussed glucose and A1C goals. Discussed importance of checking CBGs and maintaining good CBG control to prevent long-term and short-term complications. Discussed how 70/30 insulin works and importance of taking it twice a day (with breakfast and supper) to ensure he has insulin working for 24 hours. Encouraged patient to reach back out to his PCP about the Trulicity to see if he was approved for the [employer]. Explained that if not, he may need to have changes made with DM medications to get DM under better control.   Patient verbalized understanding of information discussed and reports no further questions at this time related to diabetes.   Thanks, Orlando Penner, RN, MSN, CDCES Diabetes Coordinator Inpatient Diabetes Program [phone] (Team Pager from 8am to 5pm)

## 2023-05-31 NOTE — H&P (Signed)
History and Physical    Patient: [name] GNF:[id number] DOB: 1951-02-05 DOA: 05/30/2023 DOS: the patient was seen and examined on 05/31/2023 PCP: Tommie Sams, DO  Patient coming from: Home  Chief Complaint:  Chief Complaint  Patient presents with   Headache   Loss of Vision   HPI: [name] is a 72 y.o. 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  Patient reports she has had a bad headache and a change in his vision since Tuesday morning.  He reports he went to dialysis and when the was done with dialysis he had a headache and this change in vision.  He reports he can still feels headache but is a lot better since having taken Tylenol.  He describes the pain as a head cold reporting that he does not know how to describe it as pressure or anything else.  He reports when he bends over to pick something up his headache is worse.  He denies any fever.  Patient reports he is peripheral vision loss with the headache.  And dizziness as well.  Patient has no other complaints at this time.  He does not smoke, does not drink, would want to be full code Review of Systems: As mentioned in the history of present illness. All other systems reviewed and are negative. 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)    Diabetic neuropathy (HCC)    Diabetic ulcer of foot associated with diabetes mellitus due to underlying condition, limited to breakdown of skin (HCC)    left   GERD (gastroesophageal reflux disease)    Hyperlipidemia    Hypertension    Non compliance w medication regimen    Osteomyelitis (HCC) 08/2022   great toe of right foot   Osteomyelitis of left foot (HCC)    Secondary  hyperparathyroidism of renal origin [facility])    per nephrology lov dr Thedore Mins 05-22-2022   Stroke [facility]) 2016   no deficits   Past Surgical History:  Procedure Laterality Date   AMPUTATION OF REPLICATED TOES Left 2018   left big toe, second toe   APPENDECTOMY     AV FISTULA PLACEMENT Left 10/20/2022   Procedure: INSERTION OF ARTERIOVENOUS (AV) GORE-TEX GRAFT ARM ( BRACHIAL AXILLARY);  Surgeon: Renford Dills, MD;  Location: [facility] ORS;  Service: Vascular;  Laterality: Left;   BIOPSY  08/10/2020   Procedure: BIOPSY;  Surgeon: Dolores Frame, MD;  [facility]: [facility];  Service: Gastroenterology;;   CHOLECYSTECTOMY     COLON SURGERY     blockage   COLONOSCOPY WITH PROPOFOL N/A 08/10/2020   Procedure: COLONOSCOPY WITH PROPOFOL;  Surgeon: Dolores Frame, MD;  Location: [facility];  Service: Gastroenterology;  Laterality: N/A;  815   GRAFT APPLICATION Left 12/08/2019   Procedure: GRAFT APPLICATION;  Surgeon: Asencion Islam, DPM;  Location: [facility];  Service: Podiatry;  Laterality: Left;   HERNIA REPAIR     I & D EXTREMITY Left 10/10/2021   Procedure: IRRIGATION AND DEBRIDEMENT left foot;  Surgeon: Park Liter, DPM;  Location: [facility];  Service: Podiatry;  Laterality: Left;   I & D EXTREMITY Right 08/19/2022   Procedure: IRRIGATION AND DEBRIDEMENT RIGHT  GREAT TOE GRAFT APPLICATION WITH BIOPSY;  Surgeon: Edwin Cap, DPM;  Location: [facility];  Service: Podiatry;  Laterality: Right;   IRRIGATION AND DEBRIDEMENT FOOT Left 10/16/2021   Procedure: IRRIGATION AND DEBRIDEMENT FOOT;  Surgeon: Asencion Islam, DPM;  Location: [facility];  Service: Podiatry;  Laterality: Left;  Abscess left heel   METATARSAL HEAD EXCISION Left 12/08/2019   Procedure: METATARSAL HEAD RECESSION THIRD LEFT;  Surgeon: Asencion Islam, DPM;  Location: [facility];  Service: Podiatry;  Laterality: Left;   POLYPECTOMY  08/10/2020   Procedure: POLYPECTOMY;  Surgeon:  Dolores Frame, MD;  Location: [facility];  Service: Gastroenterology;;   WOUND DEBRIDEMENT Left 12/08/2019   Procedure: DEBRIDEMENT WOUND;  Surgeon: Asencion Islam, DPM;  Location: [facility];  Service: Podiatry;  Laterality: Left;   WOUND DEBRIDEMENT Left 10/12/2021   Procedure: LEFT FOOT WOUND DEBRIDEMENT AND IRRIGATION, 1ST METATARSAL RESECTION;  Surgeon: Park Liter, DPM;  Location: [facility];  Service: Podiatry;  Laterality: Left;   Social History:  reports that he has quit smoking. He has never used smokeless tobacco. He reports that he does not currently use alcohol. He reports that he does not use drugs.  Allergies  Allergen Reactions   Losartan Potassium Other (See Comments)    hyperkalemia   Sildenafil Other (See Comments)    Not effective. "Did not like the way it made me feel"    No family history on file.  Prior to Admission medications   Medication Sig Start Date End Date Taking? Authorizing Provider  amLODipine (NORVASC) 10 MG tablet Take 1 tablet (10 mg total) by mouth daily in the afternoon. 03/22/23   Tommie Sams, DO  aspirin EC 81 MG tablet Take 81 mg by mouth daily. Swallow whole.    [provider]  atorvastatin (LIPITOR) 80 MG tablet Take 1 tablet (80 mg total) by mouth daily in the afternoon. 03/22/23   Tommie Sams, DO  calcitRIOL (ROCALTROL) 0.25 MCG capsule Take 0.25 mcg by mouth daily in the afternoon. 05/22/22   [provider]  carvedilol (COREG) 6.25 MG tablet Take 1 tablet (6.25 mg total) by mouth 2 (two) times daily. 03/29/23   Tommie Sams, DO  Continuous Glucose Sensor (FREESTYLE LIBRE 2 SENSOR) MISC USE TO CHECK BLOOD GLUCOSE CONTINUOUSLY. CHANGE SENSOR EVERY 14 DAYS 04/11/23   Tommie Sams, DO  docusate sodium (COLACE) 100 MG capsule Take 100 mg by mouth daily.    [provider]  Dulaglutide 4.5 MG/0.5ML SOPN Inject 4.5 mg into the skin every Sunday. 05/20/23   Cook, Jayce G, DO  NOVOLOG MIX  70/30 FLEXPEN (70-30) 100 UNIT/ML FlexPen Inject 20 Units into the skin 2 (two) times daily with a meal. 05/13/23   Cook, Dorie Rank G, DO  torsemide (DEMADEX) 20 MG tablet Take 20 mg by mouth daily in the afternoon.    [provider]    Physical Exam: Vitals:   05/31/23 0200 05/31/23 0300 05/31/23 0435 05/31/23 0436  BP: 109/62 112/61 125/65   Pulse: 88 84 88   Resp: 15 18 19    Temp:    98.3 F (36.8 C)  TempSrc:    Oral  SpO2: 98% 97% 97%    1.  General: Patient lying supine in bed,  no acute distress   2. Psychiatric: Alert and oriented x 3, mood and behavior normal for situation, pleasant and cooperative with exam   3. Neurologic: Lateral visual field loss in both  eyes but worse on the left than the right, language and speech are normal, moves all 4 extremities voluntarily, equal patient in the bilateral extremities   4. HEENMT:  Head is atraumatic, normocephalic, pupils reactive to light, neck is supple, trachea is midline, mucous membranes are moist   5. Respiratory : Lungs are clear to auscultation bilaterally without wheezing, rhonchi, rales, no cyanosis, no increase in work of breathing or accessory muscle use   6. Cardiovascular : Heart rate normal, rhythm is regular, no rubs or gallops, no peripheral edema, peripheral pulses palpated   7. Gastrointestinal:  Abdomen is soft, nondistended, nontender to palpation bowel sounds active, no masses or organomegaly palpated   8. Skin:  Skin is warm, dry and intact without rashes, acute lesions, or ulcers on limited exam   9.Musculoskeletal:  No acute deformities or trauma, no asymmetry in tone, no peripheral edema, peripheral pulses palpated, no tenderness to palpation in the extremities  Data Reviewed: In the ED Temp 99.7, heart rate 77-88, respiratory rate 18-20, blood pressure 145/59-152/61, satting 97-98% No leukocytosis with a white blood cell count of 8.5, hemoglobin 12.3, chemistry shows an elevated BUN of 50  and elevated creatinine at 7.20 as well as an elevated glucose at 269 CT head showed an acute infarct I personally tried to reach out to neurology but did not hear back, placing formal consult for a.m.  Assessment and Plan: * CVA (cerebral vascular accident) (HCC) - CT head shows acute infarct in the right occipital lobe - Consult neurology - MRI in the a.m. - Echo in the a.m. - Ultrasound carotids bilaterally in the a.m. - Patient is already on aspirin and statin at home - Start Plavix - Continue to monitor  ESRD (end stage renal disease) (HCC) - Tuesday Thursday Saturday dialysis - Consult nephrology - Continue to monitor  Type 2 diabetes mellitus with peripheral neuropathy (HCC) - On 70/30 at home here - Sliding scale here - Monitor CBGs  Hyperlipidemia, mixed - Continue statin  Essential hypertension - Continue Norvasc, Coreg      Advance Care Planning:   Code Status: Full Code  Consults: Neurology  Family Communication: No family at bedside  Severity of Illness: The appropriate patient status for this patient is OBSERVATION. Observation status is judged to be reasonable and necessary in order to provide the required intensity of service to ensure the patient's safety. The patient's presenting symptoms, physical exam findings, and initial radiographic and laboratory data in the context of their medical condition is felt to place them at decreased risk for further clinical deterioration. Furthermore, it is anticipated that the patient will be medically stable for discharge from the hospital within 2 midnights of admission.   Author: Lilyan Gilford, DO 05/31/2023 5:50 AM  For on call review [email or web address].

## 2023-05-31 NOTE — TOC Initial Note (Signed)
Transition of Care [facility]) - Initial/Assessment Note    Patient Details  Name: [name] MRN: [id number] Date of Birth: Oct 24, 1950  Transition of Care [facility]) CM/SW Contact:    Elliot Gault, LCSW Phone Number: 05/31/2023, 1:18 PM  Clinical Narrative:                  Pt admitted from home. PT/OT recommending HH at dc.  Spoke with pt to assess. Reviewed therapy recommendations and pt agreeable to [facility] at dc. CMS provider options/in network [facility] agency options reviewed with pt. Referred to [facility] at pt request.  Anticipating dc tomorrow. TOC will follow.  Expected Discharge Plan: Home w Home Health Services Barriers to Discharge: Continued Medical Work up   Patient Goals and CMS Choice Patient states their goals for this hospitalization and ongoing recovery are:: go home CMS Medicare.gov Compare Post Acute Care list provided to:: Patient Choice offered to / list presented to : Patient      Expected Discharge Plan and Services In-house Referral: Clinical Social Work   Post Acute Care Choice: Home Health Living arrangements for the past 2 months: Single Family Home                           HH Arranged: PT, OT HH Agency: [facility] Date [facility] Agency Contacted: 05/31/23   Representative spoke with at [facility] Agency: Rolly Salter  Prior Living Arrangements/Services Living arrangements for the past 2 months: Single Family Home Lives with:: Spouse Patient language and need for interpreter reviewed:: Yes Do you feel safe going back to the place where you live?: Yes      Need for Family Participation in Patient Care: No (Comment) Care giver support system in place?: Yes (comment) Current home services: DME Criminal Activity/Legal Involvement Pertinent to Current Situation/Hospitalization: No - Comment as needed  Activities of Daily Living Home Assistive Devices/Equipment: Dentures (specify type), Cane (specify quad or straight), CBG Meter, Raised toilet seat with rails,  Shower chair with back ADL Screening (condition at time of admission) Patient's cognitive ability adequate to safely complete daily activities?: Yes Is the patient deaf or have difficulty hearing?: No Does the patient have difficulty seeing, even when wearing glasses/contacts?: No Does the patient have difficulty concentrating, remembering, or making decisions?: No Patient able to express need for assistance with ADLs?: Yes Does the patient have difficulty dressing or bathing?: No Independently performs ADLs?: Yes (appropriate for developmental age) Does the patient have difficulty walking or climbing stairs?: Yes Weakness of Legs: Both Weakness of Arms/Hands: None  Permission Sought/Granted Permission sought to share information with : Facility Industrial/product designer granted to share information with : Yes, Verbal Permission Granted     Permission granted to share info w AGENCY: HH        Emotional Assessment Appearance:: Appears stated age Attitude/Demeanor/Rapport: Engaged Affect (typically observed): Pleasant Orientation: : Oriented to Self, Oriented to Place, Oriented to  Time, Oriented to Situation Alcohol / Substance Use: Not Applicable Psych Involvement: No (comment)  Admission diagnosis:  CVA (cerebral vascular accident) [facility]) [I63.9] Patient Active Problem List   Diagnosis Date Noted   CVA (cerebral vascular accident) (HCC) 05/30/2023   ESRD (end stage renal disease) (HCC) 02/08/2023   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) 11/10/2022   Former smoker 07/12/2022   History of stroke 07/12/2022   Diabetic neuropathy (HCC) 10/28/2021   Anemia in chronic kidney disease 05/27/2020   History of amputation of lesser  toe of left foot (HCC) 08/18/2015   Hyperlipidemia, mixed 08/18/2015   Type 2 diabetes mellitus with peripheral neuropathy (HCC) 08/18/2015   Essential hypertension 04/17/2013   PCP:  Tommie Sams, DO Pharmacy:   [facility] [address], [place] [address] [place] [address] Phone: [phone] Fax: [phone]     Social Determinants of Health (SDOH) Social History: SDOH Screenings   Food Insecurity: No Food Insecurity (05/31/2023)  Housing: Low Risk  (05/31/2023)  Transportation Needs: No Transportation Needs (05/31/2023)  Utilities: Not At Risk (05/31/2023)  Depression (PHQ2-9): Medium Risk (05/10/2023)  Tobacco Use: Medium Risk (05/31/2023)   SDOH Interventions:     Readmission Risk Interventions     No data to display

## 2023-05-31 NOTE — Evaluation (Signed)
Physical Therapy Evaluation Patient Details Name: [name] MRN: [id number] DOB: 05-05-1951 Today's Date: 05/31/2023  History of Present Illness  [name] is a 72 y.o. 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  Patient reports she has had a bad headache and a change in his vision since Tuesday morning.  He reports he went to dialysis and when the was done with dialysis he had a headache and this change in vision.  He reports he can still feels headache but is a lot better since having taken Tylenol.  He describes the pain as a head cold reporting that he does not know how to describe it as pressure or anything else.  He reports when he bends over to pick something up his headache is worse.  He denies any fever.  Patient reports he is peripheral vision loss with the headache.  And dizziness as well.  Patient has no other complaints at this time.   Clinical Impression  Patient presents with decreased left sided visual field and tends to bump into objects on left side requiring occasional verbal/tactile cueing for safety, after instructed to lead with LLE had less drifting to the left and ambulate in hallway without loss of balance.  Patient tolerated sitting up in chair after therapy - RN notified.  Patient will benefit from continued skilled physical therapy in hospital and recommended venue below to increase strength, balance, endurance for safe ADLs and gait.          If plan is discharge home, recommend the following: A little help with walking and/or transfers;A little help with bathing/dressing/bathroom;Help with stairs or ramp for entrance;Assistance with cooking/housework   Can travel by private vehicle        Equipment Recommendations None recommended by PT  Recommendations for Other Services        Functional Status Assessment Patient has had a recent decline in their functional status and demonstrates the ability to make significant improvements in function in a reasonable and predictable amount of time.     Precautions / Restrictions Precautions Precautions: Fall Restrictions Weight Bearing Restrictions: No      Mobility  Bed Mobility Overal bed mobility: Modified Independent                  Transfers Overall transfer level: Needs assistance   Transfers: Sit to/from Stand, Bed to chair/wheelchair/BSC Sit to Stand: Contact guard assist   Step pivot transfers: Contact guard assist, Min assist       General transfer comment: slightly unsteady labored movement    Ambulation/Gait Ambulation/Gait assistance: Contact guard assist Gait Distance (Feet): 80 Feet Assistive device: None Gait Pattern/deviations: Decreased step length - right, Decreased step length - left, Decreased stance time - left, Decreased stride length, Ataxic Gait velocity: decreased     General Gait Details: slightly ataxic like gait with mild difficulty advancing LLE, occasional stumbling without loss of balance, limited mostly due to decreased left visual fiield  Stairs            Wheelchair Mobility     Tilt Bed    Modified Rankin (Stroke Patients Only)       Balance Overall balance assessment: Needs assistance Sitting-balance support: Feet supported, No upper extremity supported Sitting balance-Leahy Scale: Good Sitting balance - Comments: seated at EOB   Standing balance support: During functional  activity, No upper extremity supported Standing balance-Leahy Scale: Fair Standing balance comment: without AD                             Pertinent Vitals/Pain Pain Assessment Pain Assessment: Faces Faces Pain Scale: Hurts a little bit Pain Location: headache Pain Descriptors / Indicators: Headache Pain Intervention(s): Limited activity within  patient's tolerance, Monitored during session    Home Living Family/patient expects to be discharged to:: Private residence Living Arrangements: Spouse/significant other Available Help at Discharge: Family;Available 24 hours/day Type of Home: House Home Access: Stairs to enter Entrance Stairs-Rails: None Entrance Stairs-Number of Steps: 2   Home Layout: One level Home Equipment: Cane - single point      Prior Function Prior Level of Function : Needs assist       Physical Assist : Mobility (physical) Mobility (physical): Bed mobility;Transfers;Gait;Stairs   Mobility Comments: Community ambulator with SPC. Sometimes drives. Ambulates without cane in the home. ADLs Comments: Independent ADL; assist IADL.     Extremity/Trunk Assessment   Upper Extremity Assessment Upper Extremity Assessment: Defer to OT evaluation    Lower Extremity Assessment Lower Extremity Assessment: Overall WFL for tasks assessed;LLE deficits/detail LLE Deficits / Details: grossly 4-/5 LLE Sensation: WNL LLE Coordination: decreased gross motor    Cervical / Trunk Assessment Cervical / Trunk Assessment: Normal  Communication   Communication Communication: No apparent difficulties  Cognition Arousal: Alert Behavior During Therapy: WFL for tasks assessed/performed Overall Cognitive Status: Within Functional Limits for tasks assessed                                          General Comments      Exercises     Assessment/Plan    PT Assessment Patient needs continued PT services  PT Problem List Decreased strength;Decreased activity tolerance;Decreased balance;Decreased mobility;Decreased coordination       PT Treatment Interventions DME instruction;Gait training;Stair training;Functional mobility training;Therapeutic activities;Therapeutic exercise;Balance training;Patient/family education;Neuromuscular re-education    PT Goals (Current goals can be found in the Care Plan  section)  Acute Rehab PT Goals Patient Stated Goal: return home with family to assist PT Goal Formulation: With patient Time For Goal Achievement: 06/02/23 Potential to Achieve Goals: Good    Frequency Min 3X/week     Co-evaluation PT/OT/SLP Co-Evaluation/Treatment: Yes Reason for Co-Treatment: To address functional/ADL transfers PT goals addressed during session: Mobility/safety with mobility;Balance         AM-PAC PT "6 Clicks" Mobility  Outcome Measure Help needed turning from your back to your side while in a flat bed without using bedrails?: None Help needed moving from lying on your back to sitting on the side of a flat bed without using bedrails?: None Help needed moving to and from a bed to a chair (including a wheelchair)?: A Little Help needed standing up from a chair using your arms (e.g., wheelchair or bedside chair)?: A Little Help needed to walk in hospital room?: A Little Help needed climbing 3-5 steps with a railing? : A Little 6 Click Score: 20    End of Session   Activity Tolerance: Patient tolerated treatment well;Patient limited by fatigue Patient left: in chair;with call bell/phone within reach Nurse Communication: Mobility status PT Visit Diagnosis: Unsteadiness on feet (R26.81);Other abnormalities of gait and mobility (R26.89);Muscle weakness (generalized) (M62.81)    Time: 6295-2841 PT Time  Calculation (min) (ACUTE ONLY): 30 min   Charges:   PT Evaluation $PT Eval Moderate Complexity: 1 Mod PT Treatments $Therapeutic Activity: 23-37 mins PT General Charges $$ ACUTE PT VISIT: 1 Visit         2:12 PM, 05/31/23 Ocie Bob, MPT Physical Therapist with [facility] [phone] office [phone] mobile phone

## 2023-05-31 NOTE — Assessment & Plan Note (Signed)
-   CT head shows acute infarct in the right occipital lobe - Consult neurology - MRI in the a.m. - Echo in the a.m. - Ultrasound carotids bilaterally in the a.m. - Patient is already on aspirin and statin at home - Start Plavix - Continue to monitor

## 2023-05-31 NOTE — Evaluation (Signed)
Occupational Therapy Evaluation Patient Details Name: [name] MRN: [id number] DOB: Apr 26, 1951 Today's Date: 05/31/2023   History of Present Illness [name] is a 73 y.o. 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  Patient reports she has had a bad headache and a change in his vision since Tuesday morning.  He reports he went to dialysis and when the was done with dialysis he had a headache and this change in vision.  He reports he can still feels headache but is a lot better since having taken Tylenol.  He describes the pain as a head cold reporting that he does not know how to describe it as pressure or anything else.  He reports when he bends over to pick something up his headache is worse.  He denies any fever.  Patient reports he is peripheral vision loss with the headache.  And dizziness as well.  Patient has no other complaints at this time. (per DO)   Clinical Impression   Pt agreeable to OT and PT co-evaluation. Pt is mildly unsteady on his feet, which is not uncommon at baseline with pt using a cane for community ambulation. Pt's primary deficit appears to some possible mild R visual field deficit  and significant L visual field deficit. Pt noted to run into and object in the L field of view during ambulation. Pt reports having his wife at home for support 24/7. Recommend pt has a home safety assessment and compensatory session with home health OT to maximize safety in the home. Also recommend pt have a neuro-opthalmologist evaluation possibly followed by vision therapy. Pt left in the chair with call bell within reach. Pt will benefit from continued OT in the hospital and recommended venue below to increase strength, balance, and endurance for safe ADL's.         If plan is discharge home,  recommend the following: A little help with walking and/or transfers;A little help with bathing/dressing/bathroom;Assist for transportation;Help with stairs or ramp for entrance;Assistance with cooking/housework    Functional Status Assessment  Patient has had a recent decline in their functional status and demonstrates the ability to make significant improvements in function in a reasonable and predictable amount of time.  Equipment Recommendations  None recommended by OT    Recommendations for Other Services Other (comment) (Neuro-ophthalmologists evaluation.)     Precautions / Restrictions Precautions Precautions: Fall Restrictions Weight Bearing Restrictions: No      Mobility Bed Mobility Overal bed mobility: Modified Independent                  Transfers Overall transfer level: Needs assistance   Transfers: Sit to/from Stand, Bed to chair/wheelchair/BSC Sit to Stand: Contact guard assist     Step pivot transfers: Contact guard assist, Min assist     General transfer comment: Mildy unsteday; uses cane at times at baseline. No cane use today.      Balance Overall balance assessment: Needs assistance Sitting-balance support: No upper extremity supported, Feet supported Sitting balance-Leahy Scale: Good Sitting balance - Comments: seated at EOB   Standing balance support: No upper extremity supported, During functional activity Standing balance-Leahy Scale: Fair Standing balance comment: no AD                           ADL either  performed or assessed with clinical judgement   ADL Overall ADL's : Needs assistance/impaired     Grooming: Contact guard assist;Minimal assistance;Standing   Upper Body Bathing: Set up;Sitting   Lower Body Bathing: Set up;Sitting/lateral leans       Lower Body Dressing: Set up;Sitting/lateral leans   Toilet Transfer: Contact guard assist;Minimal assistance;Ambulation Toilet Transfer Details (indicate cue  type and reason): EOB to toilet and back. Toileting- Clothing Manipulation and Hygiene: Contact guard assist;Sitting/lateral lean;Sit to/from stand       Functional mobility during ADLs: Contact guard assist;Minimal assistance General ADL Comments: Pt able to ambulate in the hall and around the nurses station. Unsteady, which pt reports is not uncommon at baseline. Noted to run into object in L field of view at the start of the walk.     Vision Baseline Vision/History: 1 Wears glasses Ability to See in Adequate Light: 1 Impaired Patient Visual Report: Other (comment) ("not see as good") Vision Assessment?: Yes Tracking/Visual Pursuits: Other (comment) (Inconsistent tracking. Difficulty noted at midline in inferior quadrants as well as L quadrants.) Visual Fields: Right visual field deficit;Left visual field deficit (Significant L visual field neglect. Mild R side visual field deficit as well. Main issue appears to be L side. Pt unaware of stimuli in L field of view until at midline.)     Perception Perception: Not tested       Praxis Praxis: [name]       Pertinent Vitals/Pain Pain Assessment Pain Assessment: Faces Faces Pain Scale: Hurts a little bit Pain Location: head Pain Descriptors / Indicators: Headache Pain Intervention(s): Limited activity within patient's tolerance, Monitored during session, Repositioned     Extremity/Trunk Assessment Upper Extremity Assessment Upper Extremity Assessment: Overall WFL for tasks assessed;Right hand dominant;LUE deficits/detail LUE Deficits / Details: Mild L wrist weakness. Nothing significant. LUE Sensation: WNL LUE Coordination: WNL   Lower Extremity Assessment Lower Extremity Assessment: Defer to PT evaluation   Cervical / Trunk Assessment Cervical / Trunk Assessment: Normal   Communication Communication Communication: No apparent difficulties   Cognition Arousal: Alert Behavior During Therapy: WFL for tasks  assessed/performed Overall Cognitive Status: Within Functional Limits for tasks assessed                                                        Home Living Family/patient expects to be discharged to:: Private residence Living Arrangements: Spouse/significant other Available Help at Discharge: Family;Available 24 hours/day Type of Home: House Home Access: Stairs to enter [employer] of Steps: 2 Entrance Stairs-Rails: None Home Layout: One level     Bathroom Shower/Tub: Chief Strategy Officer: Handicapped height Bathroom Accessibility: Yes How Accessible: Accessible via walker Home Equipment: Cane - single point          Prior Functioning/Environment Prior Level of Function : Needs assist       Physical Assist : ADLs (physical)   ADLs (physical): IADLs Mobility Comments: Community ambulator with SPC. Sometimes drives. Ambulates without cane in the home. ADLs Comments: Independent ADL; assist IADL.        OT Problem List: Impaired balance (sitting and/or standing);Impaired vision/perception      OT Treatment/Interventions: Self-care/ADL training;Therapeutic exercise;Neuromuscular education;Therapeutic activities;Patient/family education;Balance training;Visual/perceptual remediation/compensation    OT Goals(Current goals can be found in the care plan section) Acute Rehab OT Goals Patient  Stated Goal: return home OT Goal Formulation: With patient Time For Goal Achievement: 06/14/23 Potential to Achieve Goals: Good  OT Frequency: Min 1X/week    Co-evaluation PT/OT/SLP Co-Evaluation/Treatment: Yes Reason for Co-Treatment: To address functional/ADL transfers   OT goals addressed during session: ADL's and self-care      AM-PAC OT "6 Clicks" Daily Activity     Outcome Measure Help from another person eating meals?: None Help from another person taking care of personal grooming?: A Little Help from another person  toileting, which includes using toliet, bedpan, or urinal?: A Little Help from another person bathing (including washing, rinsing, drying)?: A Little Help from another person to put on and taking off regular upper body clothing?: None Help from another person to put on and taking off regular lower body clothing?: A Little 6 Click Score: 20   End of Session Equipment Utilized During Treatment: Gait belt  Activity Tolerance: Patient tolerated treatment well Patient left: in chair;with call bell/phone within reach  OT Visit Diagnosis: Unsteadiness on feet (R26.81);Other abnormalities of gait and mobility (R26.89);Other symptoms and signs involving the nervous system (N82.956)                Time: 2130-8657 OT Time Calculation (min): 25 min Charges:  OT General Charges $OT Visit: 1 Visit OT Evaluation $OT Eval Low Complexity: 1 Low  Jashad Depaula OT, MOT   Danie Chandler 05/31/2023, 9:47 AM

## 2023-05-31 NOTE — Plan of Care (Signed)
  Problem: Acute Rehab PT Goals(only PT should resolve) Goal: Pt Will Go Supine/Side To Sit Outcome: Progressing Flowsheets (Taken 05/31/2023 1414) Pt will go Supine/Side to Sit:  Independently  with modified independence Goal: Patient Will Transfer Sit To/From Stand Outcome: Progressing Flowsheets (Taken 05/31/2023 1414) Patient will transfer sit to/from stand:  Independently  with modified independence Goal: Pt Will Transfer Bed To Chair/Chair To Bed Outcome: Progressing Flowsheets (Taken 05/31/2023 1414) Pt will Transfer Bed to Chair/Chair to Bed:  Independently  with modified independence Goal: Pt Will Ambulate Outcome: Progressing Flowsheets (Taken 05/31/2023 1414) Pt will Ambulate:  > 125 feet  with supervision  with modified independence  with cane   2:15 PM, 05/31/23 Ocie Bob, MPT Physical Therapist with [facility] [phone] office [phone] mobile phone

## 2023-05-31 NOTE — ED Notes (Signed)
Patient transported to MRI 

## 2023-05-31 NOTE — Consult Note (Signed)
I connected with  [name] on 05/31/23 by a video enabled telemedicine application and verified that I am speaking with the correct person using two identifiers.   I discussed the limitations of evaluation and management by telemedicine. The patient expressed understanding and agreed to proceed.  Location of patient: [place] Location of physician: [facility]  Neurology Consultation Reason for Consult: Stroke Referring Physician: Dr. Victorino Dike  CC: Headache, blurred vision  History is obtained from: Patient, chart review  HPI: [name] is a 72 y.o. male with past medical history of hypertension, hyperlipidemia, diabetes, end-stage renal disease on HD TTS, prior stroke in 2016 without any residual deficits who presented with headache and blurred vision.  States he went for dialysis on 05/29/2023 and on his way back around 10 AM he noticed that he had severe headache as well as was bumping into things on the left side.  Symptoms did not resolve until yesterday and therefore eventually came to emergency room.  Reports taking aspirin 81 mg daily  Last known normal 05/29/2023 around 10 AM Event happened while at dialysis No tPA as outside window No thrombectomy as no large vessel occlusion mRS 0  ROS: All other systems reviewed and negative except as noted in the HPI.   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   Social History:  reports that he has quit smoking. He has never used smokeless tobacco. He reports that he does not currently use alcohol. He reports that he does not use drugs.  Exam: Current vital signs: BP (!) 117/51   Pulse 74   Temp 98.3 F (36.8 C) (Oral)   Resp 15   SpO2 96%  Vital signs in last 24 hours: Temp:  [98 F (36.7 C)-99.7 F (37.6 C)] 98.3 F (36.8 C) (08/29 0436) Pulse Rate:  [74-92] 74 (08/29 0600) Resp:  [12-22] 15 (08/29 0600) BP: (109-162)/(51-75) 117/51 (08/29 0600) SpO2:  [96 %-100 %] 96 % (08/29 0600)   Physical Exam  Constitutional: Appears well-developed and well-nourished.  Psych: Affect appropriate to situation Eyes: No scleral injection Neuro: AOx3, no aphasia, no dysarthria, left hemianopia, rest of the cranial nerves appear grossly intact, antigravity strength without drift in upper extremities, sensation intact to light touch, FTN intact bilaterally  NIHSS 3  INPUTS: 1A: Level of consciousness --> 0 = Alert; keenly responsive 1B: Ask month and age --> 0 = Both questions right 1C: 'Blink eyes' & 'squeeze hands' --> 0 = Performs both tasks 2: Horizontal extraocular movements --> 0 = Normal 3: Visual fields --> 2 = Complete hemianopia 4: Facial palsy --> 0 = Normal symmetry 5A: Left arm motor drift --> 0 = No drift for 10 seconds 5B:  Right arm motor drift --> 0 = No drift for 10 seconds 6A: Left leg motor drift --> 0 = No drift for 5 seconds 6B: Right leg motor drift --> 0 = No drift for 5 seconds 7: Limb Ataxia --> 0 = No ataxia 8: Sensation --> 0 = Normal; no sensory loss 9: Language/aphasia --> 0 = Normal; no aphasia 10: Dysarthria --> 0 = Normal 11: Extinction/inattention --> 1 = Visual/tactile/auditory/spatial/personal inattention   I have reviewed labs in epic and the results pertinent to this consultation are: CBC:   Recent Labs  Lab 05/30/23 1907 05/31/23 0312  WBC 8.3 9.1  NEUTROABS 4.8  --   HGB 12.3* 10.3*  HCT 37.6* 31.4*  MCV 91.5 90.5  PLT 171 163    Basic Metabolic Panel:  Lab Results  Component Value Date   NA 134 (L) 05/31/2023   K 3.6 05/31/2023   CO2 23 05/31/2023   GLUCOSE 225 (H) 05/31/2023   BUN 52 (H) 05/31/2023   CREATININE 6.91 (H) 05/31/2023   CALCIUM 7.4 (L) 05/31/2023   GFRNONAA 8 (L) 05/31/2023   Lipid Panel:  Lab Results  Component Value Date   LDLCALC 11 05/31/2023   HgbA1c:  Lab Results  Component Value Date   HGBA1C 9.1 (H) 05/30/2023   Urine Drug Screen: No results found for: "LABOPIA", "COCAINSCRNUR", "LABBENZ", "AMPHETMU", "THCU", "LABBARB"  Alcohol Level     Component Value Date/Time   ETH <10 05/30/2023 1907    I have reviewed the images obtained:  CT head without contrast 05/30/2023: Acute infarct in the right occipital lobe. No acute intracranial hemorrhage. Moderate chronic small vessel ischemic changes, increased compared to 2017.  MRI brain without contrast 05/31/2023: Confluent acute to early subacute Right PCA territory infarct. Cytotoxic edema with possible minimal petechial hemorrhage. But no malignant hemorrhagic transformation or significant intracranial mass effect. 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   MRA head without contrast 05/31/2023:Positive for occlusion of the Right PCA in the P2 segment. Evidence of intracranial atherosclerosis, but no other significant intracranial arterial stenosis.  US carotid bilateral 05/31/2023: Color duplex indicates moderate heterogeneous and calcified plaque, with no hemodynamically significant stenosis by duplex criteria in the extracranial cerebrovascular circulation.    ASSESSMENT/PLAN: 72 year old male with multiple medical comorbidities as noted above presented with headache and blurred vision.  MRI brain  showed acute ischemic stroke in right PCA territory  Acute ischemic stroke -Etiology: Likely embolic  Recommendations: - Permissive HTN x48 hrs from sx onset goal BP <220/110. PRN labetalol or hydralazine if BP above these parameters. Avoid oral antihypertensives. - TTE w/ bubble ordered and pending.  If negative for thrombus, recommend 30-day cardiac monitor to look for paroxysmal A-fib - ASA 81mg  daily + plavix 75mg  daily for 3 months followed by Plavix 75 mg daily -Continue atorvastatin 80 mg daily -Modification of stroke risk factors including better control of diabetes (A1c is 9.1) - q4 hr neuro checks - STAT head CT for any change in neuro exam - Tele - PT/OT/SLP - Stroke education - Amb referral to neurology upon discharge  ( ordered) -Discussed plan with patient at bedside via tele and Dr. Sharl Ma via secure chat  Thank you for allowing [place] to participate in the care of this patient. If you have any further questions, please contact  me or neurohospitalist.   Lindie Spruce [facility]ile[facility]

## 2023-05-31 NOTE — Progress Notes (Signed)
Patient had complaints of headache not releived by Tylenol this headache has been present before hospitalization. Also noted patient's temperature 100.4. Tylenol 650mg  was given for complaints of headache and provider notified. Oxycodone 5mg  given and CXR ordered.

## 2023-05-31 NOTE — Assessment & Plan Note (Signed)
-   Continue Norvasc, Coreg

## 2023-05-31 NOTE — Plan of Care (Signed)
  Problem: Acute Rehab OT Goals (only OT should resolve) Goal: Pt. Will Perform Grooming Flowsheets (Taken 05/31/2023 0950) Pt Will Perform Grooming:  with modified independence  standing Goal: Pt. Will Transfer To Toilet Flowsheets (Taken 05/31/2023 [phone]) Pt Will Transfer to Toilet:  with modified independence  ambulating Goal: OT Additional ADL Goal #1 Flowsheets (Taken 05/31/2023 0950) Additional ADL Goal #1: Pt will demonstrate improved visual perceptual skills by identifying objects in L field of view 50% of the time with set up assist.  Danie Chandler OT, MOT

## 2023-05-31 NOTE — Assessment & Plan Note (Signed)
-   On 70/30 at home here - Sliding scale here - Monitor CBGs

## 2023-05-31 NOTE — Progress Notes (Signed)
  Echocardiogram 2D Echocardiogram has been performed.  [name] 05/31/2023, 4:29 PM

## 2023-05-31 NOTE — Assessment & Plan Note (Signed)
Continue statin. 

## 2023-05-31 NOTE — Progress Notes (Signed)
Subjective: Patient admitted this morning, see detailed H&P by Dr Camillo Flaming 72 yr old male with history of ESRD on hemodialysis TTS, diabetes mellitus type 2, hyperlipidemia, GERD, history of stroke, hypertension came to ED with chief complaints of headache.  Also had change in vision.  Patient went to hemodialysis and after that developed headache with change in vision.  In the ED CT head showed acute infarct in the right occipital lobe. Neurology was consulted  Vitals:   05/31/23 0436 05/31/23 0600  BP:  (!) 117/51  Pulse:  74  Resp:  15  Temp: 98.3 F (36.8 C)   SpO2:  96%      A/P Acute infarct in the right occipital lobe -Consult neurology -Follow-up MRI/MRI brain, echocardiogram, carotid Dopplers -Patient is on aspirin at home, started on Plavix -Continue statin -Permissive hypertension  ESRD -Patient get dialysis Tuesday Thursday and Saturday -Nephrology consulted  Diabetes mellitus type 2 -Continue sliding scale insulin with NovoLog  Hyperlipidemia -Continue statin  Hypertension -Continue Norvasc, Coreg    Yancy Knoble S Annell Canty [facility]

## 2023-06-01 ENCOUNTER — Ambulatory Visit (HOSPITAL_BASED_OUTPATIENT_CLINIC_OR_DEPARTMENT_OTHER): Payer: No Typology Code available for payment source | Admitting: General Surgery

## 2023-06-01 ENCOUNTER — Other Ambulatory Visit: Payer: Self-pay

## 2023-06-01 DIAGNOSIS — I639 Cerebral infarction, unspecified: Secondary | ICD-10-CM

## 2023-06-01 DIAGNOSIS — E782 Mixed hyperlipidemia: Secondary | ICD-10-CM | POA: Diagnosis not present

## 2023-06-01 DIAGNOSIS — N250 Renal osteodystrophy: Secondary | ICD-10-CM | POA: Diagnosis not present

## 2023-06-01 DIAGNOSIS — I120 Hypertensive chronic kidney disease with stage 5 chronic kidney disease or end stage renal disease: Secondary | ICD-10-CM | POA: Diagnosis not present

## 2023-06-01 DIAGNOSIS — Z992 Dependence on renal dialysis: Secondary | ICD-10-CM | POA: Diagnosis not present

## 2023-06-01 DIAGNOSIS — I63431 Cerebral infarction due to embolism of right posterior cerebral artery: Secondary | ICD-10-CM | POA: Diagnosis not present

## 2023-06-01 DIAGNOSIS — D631 Anemia in chronic kidney disease: Secondary | ICD-10-CM | POA: Diagnosis not present

## 2023-06-01 DIAGNOSIS — N186 End stage renal disease: Secondary | ICD-10-CM | POA: Diagnosis not present

## 2023-06-01 DIAGNOSIS — I10 Essential (primary) hypertension: Secondary | ICD-10-CM | POA: Diagnosis not present

## 2023-06-01 LAB — COMPREHENSIVE METABOLIC PANEL
ALT: 18 U/L (ref 0–44)
AST: 20 U/L (ref 15–41)
Albumin: 3.3 g/dL — ABNORMAL LOW (ref 3.5–5.0)
Alkaline Phosphatase: 63 U/L (ref 38–126)
Anion gap: 16 — ABNORMAL HIGH (ref 5–15)
BUN: 62 mg/dL — ABNORMAL HIGH (ref 8–23)
CO2: 21 mmol/L — ABNORMAL LOW (ref 22–32)
Calcium: 8 mg/dL — ABNORMAL LOW (ref 8.9–10.3)
Chloride: 95 mmol/L — ABNORMAL LOW (ref 98–111)
Creatinine, Ser: 7.9 mg/dL — ABNORMAL HIGH (ref 0.61–1.24)
GFR, Estimated: 7 mL/min — ABNORMAL LOW (ref >60)
Glucose, Bld: 147 mg/dL — ABNORMAL HIGH (ref 70–99)
Potassium: 4 mmol/L (ref 3.5–5.1)
Sodium: 132 mmol/L — ABNORMAL LOW (ref 135–145)
Total Bilirubin: 0.6 mg/dL (ref 0.3–1.2)
Total Protein: 7.3 g/dL (ref 6.5–8.1)

## 2023-06-01 LAB — CBC
HCT: 31.9 % — ABNORMAL LOW (ref 39.0–52.0)
Hemoglobin: 10.4 g/dL — ABNORMAL LOW (ref 13.0–17.0)
MCH: 29.5 pg (ref 26.0–34.0)
MCHC: 32.6 g/dL (ref 30.0–36.0)
MCV: 90.6 fL (ref 80.0–100.0)
Platelets: 177 10*3/uL (ref 150–400)
RBC: 3.52 MIL/uL — ABNORMAL LOW (ref 4.22–5.81)
RDW: 15.1 % (ref 11.5–15.5)
WBC: 10 10*3/uL (ref 4.0–10.5)
nRBC: 0 % (ref 0.0–0.2)

## 2023-06-01 LAB — URINALYSIS, ROUTINE W REFLEX MICROSCOPIC
Bacteria, UA: NONE SEEN
Bilirubin Urine: NEGATIVE
Glucose, UA: 50 mg/dL — AB
Ketones, ur: NEGATIVE mg/dL
Leukocytes,Ua: NEGATIVE
Nitrite: NEGATIVE
Protein, ur: 100 mg/dL — AB
Specific Gravity, Urine: 1.012 (ref 1.005–1.030)
pH: 5 (ref 5.0–8.0)

## 2023-06-01 LAB — GLUCOSE, CAPILLARY
Glucose-Capillary: 118 mg/dL — ABNORMAL HIGH (ref 70–99)
Glucose-Capillary: 137 mg/dL — ABNORMAL HIGH (ref 70–99)
Glucose-Capillary: 173 mg/dL — ABNORMAL HIGH (ref 70–99)
Glucose-Capillary: 198 mg/dL — ABNORMAL HIGH (ref 70–99)

## 2023-06-01 LAB — HEPATITIS B SURFACE ANTIGEN: Hepatitis B Surface Ag: NONREACTIVE

## 2023-06-01 MED ORDER — CHLORHEXIDINE GLUCONATE CLOTH 2 % EX PADS
6.0000 | MEDICATED_PAD | Freq: Every day | CUTANEOUS | Status: DC
  Administered 2023-06-01 – 2023-06-03 (×3): 6 via TOPICAL

## 2023-06-01 MED ORDER — LIDOCAINE HCL (PF) 1 % IJ SOLN: 5.0000 mL | INTRAMUSCULAR | Status: DC | PRN

## 2023-06-01 MED ORDER — SUMATRIPTAN SUCCINATE 6 MG/0.5ML ~~LOC~~ SOLN
6.0000 mg | Freq: Once | SUBCUTANEOUS | Status: AC
  Administered 2023-06-01: 6 mg via SUBCUTANEOUS
  Filled 2023-06-01 (×2): qty 0.5

## 2023-06-01 MED ORDER — PENTAFLUOROPROP-TETRAFLUOROETH EX AERO: 1.0000 | INHALATION_SPRAY | CUTANEOUS | Status: DC | PRN

## 2023-06-01 MED ORDER — NEPRO/CARBSTEADY PO LIQD: 237.0000 mL | ORAL | Status: DC | PRN

## 2023-06-01 MED ORDER — HEPARIN SODIUM (PORCINE) 1000 UNIT/ML DIALYSIS
20.0000 [IU]/kg | INTRAMUSCULAR | Status: DC | PRN
  Administered 2023-06-02: 1700 [IU] via INTRAVENOUS_CENTRAL

## 2023-06-01 MED ORDER — LIDOCAINE-PRILOCAINE 2.5-2.5 % EX CREA: 1.0000 | TOPICAL_CREAM | CUTANEOUS | Status: DC | PRN

## 2023-06-01 NOTE — Plan of Care (Signed)
  Problem: Education: Goal: Knowledge of disease or condition will improve Outcome: Progressing Goal: Knowledge of secondary prevention will improve (MUST DOCUMENT ALL) Outcome: Progressing Goal: Knowledge of patient specific risk factors will improve Loraine Leriche N/A or DELETE if not current risk factor) Outcome: Progressing   Problem: Health Behavior/Discharge Planning: Goal: Goals will be collaboratively established with patient/family Outcome: Progressing   Problem: Self-Care: Goal: Ability to participate in self-care as condition permits will improve Outcome: Progressing Goal: Verbalization of feelings and concerns over difficulty with self-care will improve Outcome: Progressing Goal: Ability to communicate needs accurately will improve Outcome: Progressing   Problem: Nutrition: Goal: Risk of aspiration will decrease Outcome: Progressing

## 2023-06-01 NOTE — Consult Note (Addendum)
[facility] Renal Consultation Note    Indication for Consultation:  Management of ESRD/hemodialysis; anemia, hypertension/volume and secondary hyperparathyroidism  HPI: [name] is a 72 y.o. male with a PMH significant for DM, HTN, h/o CVA, HLD, anemia, and ESRD (TTS at [facility]) who presented to [facility] ED on 05/31/23 c/o severe headache and change in vision for the last 3 days.  In the ED, Temp 99.7, Bp 145/61, HR 77, SpO2 98%.  Labs notable for glucose 269.  CT scan with acute infarct in right occipital lobe, no acute ICH.   MRA with occlusion of the right PCA.  He was admitted for stroke and we were consulted to provide dialysis during his hospitalization.   Unfortunately due to our census he was not able to receive HD yesterday but will plan for HD today.  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)    per nephrology lov dr Thedore Mins 05-22-2022   Stroke [facility]) 2016   no deficits   Past Surgical History:  Procedure Laterality Date   AMPUTATION OF REPLICATED TOES Left 2018   left big toe, second toe   APPENDECTOMY     AV FISTULA PLACEMENT Left 10/20/2022   Procedure: INSERTION OF ARTERIOVENOUS (AV) GORE-TEX GRAFT ARM ( BRACHIAL AXILLARY);  Surgeon: Renford Dills, MD;  Location: [facility] ORS;  Service: Vascular;  Laterality: Left;   BIOPSY  08/10/2020   Procedure: BIOPSY;  Surgeon: Dolores Frame, MD;  Location: [facility];  Service: Gastroenterology;;   CHOLECYSTECTOMY     COLON SURGERY     blockage   COLONOSCOPY WITH PROPOFOL N/A 08/10/2020   Procedure: COLONOSCOPY WITH PROPOFOL;  Surgeon: Dolores Frame, MD;  Location: [facility];  Service: Gastroenterology;  Laterality: N/A;  815   GRAFT APPLICATION Left 12/08/2019   Procedure: GRAFT APPLICATION;  Surgeon: Asencion Islam, DPM;  Location: [facility];  Service: Podiatry;  Laterality: Left;   HERNIA REPAIR     I & D EXTREMITY Left 10/10/2021   Procedure: IRRIGATION AND DEBRIDEMENT left foot;  Surgeon: Park Liter, DPM;  Location: [facility] OR;  Service: Podiatry;  Laterality: Left;   I & D EXTREMITY Right 08/19/2022   Procedure: IRRIGATION AND DEBRIDEMENT RIGHT GREAT TOE GRAFT APPLICATION WITH BIOPSY;  Surgeon: Edwin Cap, DPM;  Location: [facility] OR;  Service: Podiatry;  Laterality: Right;   IRRIGATION AND DEBRIDEMENT FOOT Left 10/16/2021   Procedure: IRRIGATION AND DEBRIDEMENT FOOT;  Surgeon: Asencion Islam, DPM;  Location: [facility] OR;  Service: Podiatry;  Laterality: Left;  Abscess left heel   METATARSAL HEAD EXCISION Left 12/08/2019   Procedure: METATARSAL HEAD RECESSION THIRD LEFT;  Surgeon: Asencion Islam, DPM;  Location: [facility];  Service: Podiatry;  Laterality: Left;   POLYPECTOMY  08/10/2020   Procedure: POLYPECTOMY;  Surgeon: Dolores Frame, MD;  Location: [facility];  Service: Gastroenterology;;   WOUND DEBRIDEMENT Left 12/08/2019   Procedure: DEBRIDEMENT WOUND;  Surgeon: Asencion Islam, DPM;  Location: [facility];  Service: Podiatry;  Laterality: Left;   WOUND DEBRIDEMENT Left 10/12/2021   Procedure: LEFT FOOT WOUND DEBRIDEMENT AND IRRIGATION, 1ST METATARSAL RESECTION;  Surgeon: Park Liter, DPM;  Location: [facility] OR;  Service: Podiatry;  Laterality: Left;   Family History:   History reviewed. No pertinent family history. Social History:  reports that he has quit  smoking. He has never used smokeless tobacco. He reports that he does not currently use alcohol. He reports that he does not use drugs. Allergies  Allergen Reactions   Losartan Potassium Other (See Comments)    hyperkalemia   Sildenafil Other (See Comments)    Not effective. "Did not like the way it made me feel"   Prior to Admission medications   Medication Sig Start Date End Date Taking? Authorizing Provider  amLODipine (NORVASC) 10 MG tablet Take 1 tablet (10 mg total) by mouth daily in the afternoon. 03/22/23  Yes Tommie Sams, DO  aspirin EC 81 MG tablet Take 81 mg by mouth daily. Swallow whole.   Yes [provider]  atorvastatin (LIPITOR) 80 MG tablet Take 1 tablet (80 mg total) by mouth daily in the afternoon. 03/22/23  Yes Cook, Jayce G, DO  calcitRIOL (ROCALTROL) 0.25 MCG capsule Take 0.25 mcg by mouth daily in the afternoon. 05/22/22  Yes [provider]  Dulaglutide 4.5 MG/0.5ML SOPN Inject 4.5 mg into the skin every Sunday. 05/20/23  Yes Cook, Jayce G, DO  furosemide (LASIX) 80 MG tablet Take 80 mg by mouth daily.   Yes [provider]  NOVOLOG MIX 70/30 FLEXPEN (70-30) 100 UNIT/ML FlexPen Inject 20 Units into the skin 2 (two) times daily with a meal. Patient taking differently: Inject 30 Units into the skin 2 (two) times daily with a meal. 05/13/23  Yes Cook, Jayce G, DO  carvedilol (COREG) 6.25 MG tablet Take 1 tablet (6.25 mg total) by mouth 2 (two) times daily. Patient not taking: Reported on 05/31/2023 03/29/23   Tommie Sams, DO  Continuous Glucose Sensor (FREESTYLE LIBRE 2 SENSOR) MISC USE TO CHECK BLOOD GLUCOSE CONTINUOUSLY. CHANGE SENSOR EVERY 14 DAYS 04/11/23   Tommie Sams, DO  docusate sodium (COLACE) 100 MG capsule Take 100 mg by mouth daily.    [provider]   Current Facility-Administered Medications  Medication Dose Route Frequency Provider Last Rate Last Admin   acetaminophen (TYLENOL) tablet 650 mg  650 mg Oral Q4H PRN  Zierle-Ghosh, Asia B, DO   650 mg at 06/01/23 0806   Or   acetaminophen (TYLENOL) 160 MG/5ML solution 650 mg  650 mg Per Tube Q4H PRN Zierle-Ghosh, Asia B, DO       Or   acetaminophen (TYLENOL) suppository 650 mg  650 mg Rectal Q4H PRN Zierle-Ghosh, Asia B, DO       aspirin EC tablet 81 mg  81 mg Oral Daily Zierle-Ghosh, Asia B, DO   81 mg at 05/31/23 1216   atorvastatin (LIPITOR) tablet 80 mg  80 mg Oral Q1500 Zierle-Ghosh, Asia B, DO   80 mg at 05/31/23 1437   calcitRIOL (ROCALTROL) capsule 0.25 mcg  0.25 mcg Oral Q1500 Zierle-Ghosh, Asia B, DO   0.25 mcg at 05/31/23 1437   clopidogrel (PLAVIX) tablet 75 mg  75 mg Oral Daily Zierle-Ghosh, Asia B, DO   75 mg at 05/31/23 1215   heparin injection 5,000  Units  5,000 Units Subcutaneous Q8H Zierle-Ghosh, Asia B, DO   5,000 Units at 06/01/23 0549   insulin aspart (novoLOG) injection 0-15 Units  0-15 Units Subcutaneous TID WC Zierle-Ghosh, Asia B, DO   2 Units at 06/01/23 0802   insulin aspart (novoLOG) injection 0-5 Units  0-5 Units Subcutaneous QHS Zierle-Ghosh, Asia B, DO   2 Units at 05/31/23 2156   labetalol (NORMODYNE) injection 5 mg  5 mg Intravenous Q10 min PRN Charlsie Quest, MD       ondansetron (ZOFRAN) injection 4 mg  4 mg Intravenous Q8H PRN Zierle-Ghosh, Asia B, DO       oxyCODONE (Oxy IR/ROXICODONE) immediate release tablet 5 mg  5 mg Oral Q6H PRN Meredeth Ide, MD   5 mg at 06/01/23 0435   senna-docusate (Senokot-S) tablet 1 tablet  1 tablet Oral QHS PRN Zierle-Ghosh, Asia B, DO       Labs: Basic Metabolic Panel: Recent Labs  Lab 05/30/23 1907 05/31/23 0312 06/01/23 0500  NA 136 134* 132*  K 3.8 3.6 4.0  CL 96* 97* 95*  CO2 26 23 21*  GLUCOSE 269* 225* 147*  BUN 50* 52* 62*  CREATININE 7.20* 6.91* 7.90*  CALCIUM 8.1* 7.4* 8.0*   Liver Function Tests: Recent Labs  Lab 05/30/23 1907 05/31/23 0312 06/01/23 0500  AST 17 14* 20  ALT 20 17 18   ALKPHOS 74 64 63  BILITOT 0.4 0.4 0.6  PROT 7.6 6.5 7.3  ALBUMIN 3.5 3.0*  3.3*   No results for input(s): "LIPASE", "AMYLASE" in the last 168 hours. No results for input(s): "AMMONIA" in the last 168 hours. CBC: Recent Labs  Lab 05/30/23 1907 05/31/23 0312 06/01/23 0916  WBC 8.3 9.1 10.0  NEUTROABS 4.8  --   --   HGB 12.3* 10.3* 10.4*  HCT 37.6* 31.4* 31.9*  MCV 91.5 90.5 90.6  PLT 171 163 177   Cardiac Enzymes: No results for input(s): "CKTOTAL", "CKMB", "CKMBINDEX", "TROPONINI" in the last 168 hours. CBG: Recent Labs  Lab 05/31/23 0803 05/31/23 1208 05/31/23 1632 05/31/23 2130 06/01/23 0748  GLUCAP 179* 181* 219* 205* 173*   Iron Studies: No results for input(s): "IRON", "TIBC", "TRANSFERRIN", "FERRITIN" in the last 72 hours. Studies/Results: DG Chest Port 1 View  Result Date: 05/31/2023 CLINICAL DATA:  Fever EXAM: PORTABLE CHEST 1 VIEW COMPARISON:  10/19/2022 FINDINGS: Heart size and pulmonary vascularity are normal. Bullous emphysematous changes in the right upper lung with scattered fibrosis in the lungs. No airspace disease or consolidation. No pleural effusions. No pneumothorax. Mediastinal contours appear intact. Calcification of the aorta. Degenerative changes in the spine. 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 Scattered fibrosis. No active pulmonary disease. Electronically Signed   By: Burman Nieves M.D.   On: 05/31/2023 22:19   ECHOCARDIOGRAM COMPLETE BUBBLE STUDY  Result Date: 05/31/2023    ECHOCARDIOGRAM REPORT   Patient Name:   [name] Date of Exam: 05/31/2023 Medical Rec #:  [id number]     Height:       72.0 in Accession #:    [id number]    Weight:       190.0 lb Date of Birth:  May 12, 1951     BSA:          2.085 m Patient Age:    72 years      BP:           128/74 mmHg Patient Gender: M  HR:           85 bpm. [facility] Procedure: 2D Echo, Color Doppler, Cardiac Doppler and Saline Contrast Bubble            Study Indications:    Stroke 434.91 / I63.9  History:        Patient has no  prior history of Echocardiogram examinations.                 Risk Factors:Hypertension, Dyslipidemia, Diabetes and Former                 Smoker.  Sonographer:    Aron Baba Referring Phys: [id number] ASIA B ZIERLE-GHOSH  Sonographer Comments: Image acquisition challenging due to respiratory motion. IMPRESSIONS  1. Left ventricular ejection fraction, by estimation, is 55 to 60%. The left ventricle has normal function. The left ventricle has no regional wall motion abnormalities. Left ventricular diastolic parameters are indeterminate.  2. Right ventricular systolic function is normal. The right ventricular size is normal. There is normal pulmonary artery systolic pressure.  3. The mitral valve is normal in structure. Trivial mitral valve regurgitation. No evidence of mitral stenosis.  4. The aortic valve is tricuspid. There is mild calcification of the aortic valve. There is mild thickening of the aortic valve. Aortic valve regurgitation is not visualized. No aortic stenosis is present.  5. The inferior vena cava is normal in size with greater than 50% respiratory variability, suggesting right atrial pressure of 3 mmHg.  6. Agitated saline contrast bubble study was negative, with no evidence of any interatrial shunt. FINDINGS  Left Ventricle: Left ventricular ejection fraction, by estimation, is 55 to 60%. The left ventricle has normal function. The left ventricle has no regional wall motion abnormalities. The left ventricular internal cavity size was normal in size. There is  no left ventricular hypertrophy. Left ventricular diastolic parameters are indeterminate. Right Ventricle: The right ventricular size is normal. Right vetricular wall thickness was not well visualized. Right ventricular systolic function is normal. There is normal pulmonary artery systolic pressure. The tricuspid regurgitant velocity is 2.50 m/s, and with an assumed right atrial pressure of 3 mmHg, the estimated right ventricular systolic  pressure is 28.0 mmHg. Left Atrium: Left atrial size was normal in size. Right Atrium: Right atrial size was normal in size. Pericardium: There is no evidence of pericardial effusion. Mitral Valve: The mitral valve is normal in structure. Trivial mitral valve regurgitation. No evidence of mitral valve stenosis. Tricuspid Valve: The tricuspid valve is normal in structure. Tricuspid valve regurgitation is not demonstrated. No evidence of tricuspid stenosis. Aortic Valve: The aortic valve is tricuspid. There is mild calcification of the aortic valve. There is mild thickening of the aortic valve. There is mild aortic valve annular calcification. Aortic valve regurgitation is not visualized. No aortic stenosis  is present. Aortic valve mean gradient measures 4.7 mmHg. Aortic valve peak gradient measures 9.7 mmHg. Aortic valve area, by VTI measures 2.72 cm. Pulmonic Valve: The pulmonic valve was not well visualized. Pulmonic valve regurgitation is not visualized. No evidence of pulmonic stenosis. Aorta: The aortic root and ascending aorta are structurally normal, with no evidence of dilitation. Venous: The inferior vena cava is normal in size with greater than 50% respiratory variability, suggesting right atrial pressure of 3 mmHg. IAS/Shunts: The interatrial septum was not well visualized. Agitated saline contrast was given intravenously to evaluate for intracardiac shunting. Agitated saline contrast bubble study was negative, with no evidence of any  interatrial shunt.  LEFT VENTRICLE PLAX 2D LVIDd:         4.30 cm      Diastology LVIDs:         3.10 cm      LV e' medial:    9.32 cm/s LV PW:         0.90 cm      LV E/e' medial:  11.7 LV IVS:        0.80 cm      LV e' lateral:   10.60 cm/s LVOT diam:     2.10 cm      LV E/e' lateral: 10.3 LV SV:         74 LV SV Index:   36 LVOT Area:     3.46 cm  LV Volumes (MOD) LV vol d, MOD A2C: 110.0 ml LV vol d, MOD A4C: 79.8 ml LV vol s, MOD A2C: 47.7 ml LV vol s, MOD A4C: 48.6  ml LV SV MOD A2C:     62.3 ml LV SV MOD A4C:     79.8 ml LV SV MOD BP:      47.2 ml RIGHT VENTRICLE RV S prime:     17.30 cm/s TAPSE (M-mode): 2.0 cm LEFT ATRIUM           Index        RIGHT ATRIUM           Index LA diam:      3.40 cm 1.63 cm/m   RA Area:     13.20 cm LA Vol (A2C): 56.2 ml 26.96 ml/m  RA Volume:   32.30 ml  15.49 ml/m LA Vol (A4C): 46.1 ml 22.11 ml/m  AORTIC VALVE AV Area (Vmax):    2.88 cm AV Area (Vmean):   2.81 cm AV Area (VTI):     2.72 cm AV Vmax:           155.34 cm/s AV Vmean:          102.491 cm/s AV VTI:            0.273 m AV Peak Grad:      9.7 mmHg AV Mean Grad:      4.7 mmHg LVOT Vmax:         129.00 cm/s LVOT Vmean:        83.100 cm/s LVOT VTI:          0.215 m LVOT/AV VTI ratio: 0.79  AORTA Ao Root diam: 3.30 cm Ao Asc diam:  3.30 cm MITRAL VALVE                TRICUSPID VALVE MV Area (PHT): 4.10 cm     TR Peak grad:   25.0 mmHg MV Decel Time: 185 msec     TR Vmax:        250.00 cm/s MV E velocity: 109.00 cm/s MV A velocity: 119.00 cm/s  SHUNTS MV E/A ratio:  0.92         Systemic VTI:  0.22 m                             Systemic Diam: 2.10 cm Dina Rich MD Electronically signed by Dina Rich MD Signature Date/Time: 05/31/2023/4:33:18 PM    Final    MR ANGIO HEAD WO CONTRAST  Result Date: 05/31/2023 CLINICAL DATA:  72 year old male with neurologic deficit, headache and blurred vision, presented yesterday with evidence of right PCA infarct  on head CT. EXAM: MRA HEAD WITHOUT CONTRAST TECHNIQUE: Angiographic images of the Circle of Willis were acquired using MRA technique without intravenous contrast. COMPARISON:  Brain MRI today reported separately. FINDINGS: Anterior circulation: Antegrade flow in both ICA siphons. ICA siphon irregularity suggesting atherosclerosis but no significant siphon stenosis. Ophthalmic artery origins are within normal limits. Patent carotid termini. Normal MCA and ACA origins. Anterior communicating artery and visible ACA branches are  within normal limits. Left MCA M1 is patent and bifurcates early without stenosis. Right MCA M1 and bifurcation are patent without stenosis. Visible MCA branches are within normal limits. Posterior circulation: Antegrade flow in the posterior circulation. Mild irregularity of the distal vertebral arteries (the right appears dominant) and the basilar artery without significant stenosis. Bilateral PICA and AICA origins appear patent. SCA and PCA origins are patent. Posterior communicating arteries are diminutive or absent. Left PCA branches are within normal limits. But the right PCA occludes in the P2 segment (series 1027, image 14 and series 14 image 101. Anatomic variants: Mildly dominant distal right vertebral artery. Other: Brain MRI today is reported separately. IMPRESSION: 1. Positive for occlusion of the Right PCA in the P2 segment, concordant with the Brain MRI today reported separately. 2. Evidence of intracranial atherosclerosis, but no other significant intracranial arterial stenosis. Electronically Signed   By: Odessa Fleming M.D.   On: 05/31/2023 10:22   US Carotid Bilateral (at [facility] and [facility] only)  Result Date: 05/31/2023 CLINICAL DATA:  72 year old male with a history of vision loss EXAM: BILATERAL CAROTID DUPLEX ULTRASOUND TECHNIQUE: Wallace Cullens scale imaging, color Doppler and duplex ultrasound were performed of bilateral carotid and vertebral arteries in the neck. COMPARISON:  None Available. FINDINGS: Criteria: Quantification of carotid stenosis is based on velocity parameters that correlate the residual internal carotid diameter with NASCET-based stenosis levels, using the diameter of the distal internal carotid lumen as the denominator for stenosis measurement. The following velocity measurements were obtained: RIGHT ICA:  Systolic 95 cm/sec, Diastolic 21 cm/sec CCA:  80 cm/sec SYSTOLIC ICA/CCA RATIO:  1.2 ECA:  145 cm/sec LEFT ICA:  Systolic 87 cm/sec, Diastolic 18 cm/sec CCA:  77 cm/sec SYSTOLIC ICA/CCA  RATIO:  1.1 ECA:  157 cm/sec Right Brachial SBP: Not acquired Left Brachial SBP: Not acquired RIGHT CAROTID ARTERY: No significant calcifications of the right common carotid artery. Intermediate waveform maintained. Moderate heterogeneous and partially calcified plaque at the right carotid bifurcation. No significant lumen shadowing. Low resistance waveform of the right ICA. No significant tortuosity. RIGHT VERTEBRAL ARTERY: Antegrade flow with low resistance waveform. LEFT CAROTID ARTERY: No significant calcifications of the left common carotid artery. Intermediate waveform maintained. Moderate heterogeneous and partially calcified plaque at the left carotid bifurcation. No significant lumen shadowing. Low resistance waveform of the left ICA. No significant tortuosity. LEFT VERTEBRAL ARTERY:  Antegrade flow with low resistance waveform. IMPRESSION: Color duplex indicates moderate heterogeneous and calcified plaque, with no hemodynamically significant stenosis by duplex criteria in the extracranial cerebrovascular circulation. Signed, Yvone Neu. Miachel Roux, RPVI [facility] Electronically Signed   By: [name] D.O.   On: 05/31/2023 10:06   MR BRAIN WO CONTRAST  Result Date: 05/31/2023 CLINICAL DATA:  72 year old male with neurologic deficit, headache and blurred vision, presented yesterday with evidence of right PCA infarct on head CT. EXAM: MRI HEAD WITHOUT CONTRAST TECHNIQUE: Multiplanar, multiecho pulse sequences of the brain and surrounding structures were obtained without intravenous contrast. COMPARISON:  Head CT yesterday. Intracranial MRA today is  reported separately. FINDINGS: Brain: Confluent restricted diffusion and cytotoxic edema throughout the right PCA territory including patchy involvement of the right thalamus and splenium (series 9, image 19). Minimal involvement of the right mesial temporal lobe (series 9, image 12). Confluent  T2 and FLAIR hyperintensity plus gyral edema corresponding to the CT hypodensity yesterday. Early T1 hypointensity. Minimal petechial hemorrhage is possible (series 22, image 30). Mild mass effect on the atrium of the right lateral ventricle. No malignant hemorrhagic transformation and no significant intracranial mass effect. No diffusion restriction outside of the right PCA territory. But multifocal chronic lacunar infarcts in the bilateral cerebral white matter, left corona radiata, posterior left lentiform, bilateral thalami, left central pons. Patchy and confluent additional cerebral white matter T2 and FLAIR hyperintensity in both hemispheres. No chronic cortical encephalomalacia. Multiple chronic microhemorrhages in the bilateral thalami (series 22, image 27) and a small chronic microhemorrhage also in the right parietal lobe outside the area of cytotoxic edema (image 33). No midline shift, evidence of mass lesion, ventriculomegaly, extra-axial collection. Cervicomedullary junction and pituitary are within normal limits. Vascular: Major intracranial vascular flow voids are preserved. See intracranial MRA reported separately today. Skull and upper cervical spine: Partially visible advanced cervical spine degeneration with degenerative marrow signal changes (series 13, image 14). Calvarium and face bone marrow signal appears to remain normal. Sinuses/Orbits: Postoperative changes to the right globe, otherwise negative orbits. Paranasal Visualized paranasal sinuses and mastoids are stable and well aerated. Other: Visible internal auditory structures appear normal. Negative visible scalp and face. IMPRESSION: 1. Confluent acute to early subacute Right PCA territory infarct. Cytotoxic edema with possible minimal petechial hemorrhage. But no malignant hemorrhagic transformation or significant intracranial mass effect. 2. See intracranial MRA reported separately. 3. 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 Electronically Signed   By: Odessa Fleming M.D.   On: 05/31/2023 09:59   CT Head Wo Contrast  Addendum Date: 05/30/2023   ADDENDUM REPORT: 05/30/2023 22:08 ADDENDUM: These results were called by telephone at the time of interpretation on 05/30/2023 at 10:02 pm to provider Benjiman Core , who verbally acknowledged these results. Electronically Signed   By: Darliss Cheney M.D.   On: 05/30/2023 22:08   Result Date: 05/30/2023 CLINICAL DATA:  Acute stroke suspected. Headache and blurred vision. EXAM: CT HEAD WITHOUT CONTRAST TECHNIQUE: Contiguous axial images were obtained from the base of the skull through the vertex without intravenous contrast. RADIATION DOSE REDUCTION: This exam was performed according to the departmental dose-optimization program which includes automated exposure control, adjustment of the mA and/or kV according to patient size and/or use of iterative reconstruction technique. COMPARISON:  Head CT 09/03/2010 FINDINGS: Brain: There is hypodensity with loss of gray-white matter distinction and sulcal effacement in the posterior right occipital lobe compatible with acute infarct. There is no midline shift. There is no acute intracranial hemorrhage or acute extra-axial fluid collection. Brain volume is age-appropriate. There is moderate periventricular and deep white matter hypodensity which has increased compared to 2017. Vascular: Atherosclerotic calcifications are present within the cavernous internal carotid arteries. Skull: Normal. Negative for fracture or focal lesion. Sinuses/Orbits: No acute finding. Other: None. IMPRESSION: 1. Acute infarct in the right occipital lobe. No acute intracranial hemorrhage. 2. Moderate chronic small vessel ischemic changes, increased compared to 2017. Electronically Signed: By: Darliss Cheney M.D. On: 05/30/2023 21:56    ROS: Pertinent items are noted in HPI. Physical Exam: Vitals:    05/31/23 1830 05/31/23 2134  06/01/23 0213 06/01/23 0449  BP: (!) 140/58 131/60 131/64 (!) 139/55  Pulse: 100 88 86 92  Resp: 18 18  19   Temp: (!) 101.3 F (38.5 C) (!) 100.6 F (38.1 C) 99.8 F (37.7 C) (!) 100.7 F (38.2 C)  TempSrc: Oral Oral Oral   SpO2:  94% 98% 100%  Weight:      Height:          Weight change:   Intake/Output Summary (Last 24 hours) at 06/01/2023 1053 Last data filed at 05/31/2023 1700 Gross per 24 hour  Intake 240 ml  Output --  Net 240 ml   BP (!) 139/55 (BP Location: Right Arm)   Pulse 92   Temp (!) 100.7 F (38.2 C)   Resp 19   Ht 6' (1.829 m)   Wt 86.2 kg   SpO2 100%   BMI 25.77 kg/m  General appearance: alert and no distress Head: Normocephalic, without obvious abnormality, atraumatic Resp: clear to auscultation bilaterally Cardio: regular rate and rhythm, S1, S2 normal, no murmur, click, rub or gallop GI: soft, non-tender; bowel sounds normal; no masses,  no organomegaly Extremities: extremities normal, atraumatic, no cyanosis or edema and LUE AVF +T/B Dialysis Access:  Dialysis Orders: Center: [facility]  on TTS . EDW 85.5 HD Bath 2K/2.5Ca  Time 4:00 Heparin 600 unit IVP then 1000 units/hr. Access LUE AVF BFR 400 DFR 600    Micera 30 mcg every 4 weeks, Venofer 50 mg IV weekly  Assessment/Plan:  Acute Right PCA territory infarct - Neurology consulted.  To have TTE with bubble study.  Currently on asa and plavix  ESRD -  off schedule.  Will plan for HD today and again tomorrow to get back on TTS schedule since he will likely be discharged tomorrow.  Hypertension/volume  - stable  Anemia  -  stable.  ESA as needed  Metabolic bone disease -   continue with home meds  Nutrition -  renal diet, carb modified.  Irena Cords, MD [facility] Pager [phone] 06/01/2023, 10:53 AM     The patient will not be physically seen over the weekend, however his labs and notes will be reviewed remotely and  on-call coverage is available if needed.

## 2023-06-01 NOTE — Progress Notes (Signed)
   HEMODIALYSIS TREATMENT NOTE:  Pre HD: c/o HA unrelieved by Acetaminophen.  Oxy 5mg  IR not due for another 1.5 hours and, once given, provided no relief.  Neuro checks unremarkable. Imitrex 6mg  was given per Dr. Elita Boone order.   3 hour low-heparin treatment completed.  Goal met: 1.5 liters removed without interruption in UF.  All blood was returned.    06/01/23 2314  Vitals  Temp 99 F (37.2 C)  Temp Source Oral  BP (!) 150/75  MAP (mmHg) 96  BP Location Right Arm  BP Method Automatic  Patient Position (if appropriate) Sitting  Pulse Rate 82  Pulse Rate Source Monitor  ECG Heart Rate 84  Resp 16  Oxygen Therapy  SpO2 (!) 10 %  O2 Device Room Air  Post Treatment  Dialyzer Clearance Lightly streaked  Hemodialysis Intake (mL) 0 mL  Liters Processed 59.8  Fluid Removed (mL) 1600 mL  Tolerated HD Treatment Yes  Post-Hemodialysis Comments Goal met  AVG/AVF Arterial Site Held (minutes) 10 minutes  AVG/AVF Venous Site Held (minutes) 10 minutes  Fistula / Graft Left Upper arm Arteriovenous vein graft  Placement Date/Time: 10/20/22 0901   Orientation: Left  Access Location: Upper arm  Access Type: Arteriovenous vein graft  Fistula / Graft Assessment Thrill;Bruit  Status Patent    Arman Filter, RN AP KDU

## 2023-06-01 NOTE — Progress Notes (Signed)
[facility]  PROGRESS NOTE  [name] [id number]:[id number] DOB: 31-Jul-1951 DOA: 05/30/2023 PCP: Tommie Sams, DO  Brief hospital course 72 yr old male with history of ESRD on hemodialysis TTS, diabetes mellitus type 2, hyperlipidemia, GERD, history of stroke, hypertension came to ED with chief complaints of headache.  Also had change in vision.  Patient went to hemodialysis and after that developed headache with change in vision.  In the ED CT head showed acute infarct in the right occipital lobe. Neurology was consulted    Subjective   Has been febrile with Tmax 101.3.  Blood cultures x 2 obtained, chest x-ray is clear.  Complaint of head cold, CT head and MRI brain did not show sinusitis.  Denies runny nose or sore throat.     Assessment and Plan:  Acute infarct in the right occipital lobe -Likely embolic infarct, MRI brain showed acute ischemic stroke in the right PCA territory -Neurology consulted, recommend aspirin and Plavix for 3 months followed by Plavix 75 mg daily -Continue atorvastatin 80 mg daily -Continue permissive hypertension for 48 hours -MRI brain showed occlusion of right PCA in the P2 segment -Echocardiogram did not show intracardiac thrombus -Will need event monitor set up as outpatient, called cardiology, they will call and set up event monitor as outpatient.  Fever -Unclear etiology -Temperature 101.3 -Blood cultures x 2 obtained, urinalysis and culture ordered -Chest x-ray clear, MRI brain negative for sinusitis -?  Viral -Follow blood culture results, will not initiate antibiotics at this time, WBC is normal  ESRD -Patient get dialysis Tuesday Thursday and Saturday -Nephrology following   Diabetes mellitus type 2 -Continue sliding scale insulin with NovoLog -CBG well-controlled   Hyperlipidemia -Continue statin   Hypertension -Norvasc and Coreg on hold for permissive hypertension       Medications     aspirin EC  81 mg Oral Daily    atorvastatin  80 mg Oral Q1500   calcitRIOL  0.25 mcg Oral Q1500   Chlorhexidine Gluconate Cloth  6 each Topical Q0600   clopidogrel  75 mg Oral Daily   heparin  5,000 Units Subcutaneous Q8H   insulin aspart  0-15 Units Subcutaneous TID WC   insulin aspart  0-5 Units Subcutaneous QHS     Data Reviewed:   CBG:  Recent Labs  Lab 05/31/23 1208 05/31/23 1632 05/31/23 2130 06/01/23 0748 06/01/23 1117  GLUCAP 181* 219* 205* 173* 137*    SpO2: 100 %    Vitals:   05/31/23 1830 05/31/23 2134 06/01/23 0213 06/01/23 0449  BP: (!) 140/58 131/60 131/64 (!) 139/55  Pulse: 100 88 86 92  Resp: 18 18  19   Temp: (!) 101.3 F (38.5 C) (!) 100.6 F (38.1 C) 99.8 F (37.7 C) (!) 100.7 F (38.2 C)  TempSrc: Oral Oral Oral   SpO2:  94% 98% 100%  Weight:      Height:          Data Reviewed:  Basic Metabolic Panel: Recent Labs  Lab 05/30/23 1907 05/31/23 0312 06/01/23 0500  NA 136 134* 132*  K 3.8 3.6 4.0  CL 96* 97* 95*  CO2 26 23 21*  GLUCOSE 269* 225* 147*  BUN 50* 52* 62*  CREATININE 7.20* 6.91* 7.90*  CALCIUM 8.1* 7.4* 8.0*    CBC: Recent Labs  Lab 05/30/23 1907 05/31/23 0312 06/01/23 0916  WBC 8.3 9.1 10.0  NEUTROABS 4.8  --   --   HGB 12.3* 10.3* 10.4*  HCT 37.6* 31.4* 31.9*  MCV 91.5 90.5 90.6  PLT 171 163 177    LFT Recent Labs  Lab 05/30/23 1907 05/31/23 0312 06/01/23 0500  AST 17 14* 20  ALT 20 17 18   ALKPHOS 74 64 63  BILITOT 0.4 0.4 0.6  PROT 7.6 6.5 7.3  ALBUMIN 3.5 3.0* 3.3*         Antibiotics: Anti-infectives (From admission, onward)    None        DVT prophylaxis: Heparin  Code Status: Full code  Family Communication: Discussed with patient's daughter on phone  CONSULTS:    Objective    Physical Examination:   General-appears in no acute distress Heart-S1-S2, regular, no murmur auscultated Lungs-clear to auscultation bilaterally, no wheezing or crackles auscultated Abdomen-soft, nontender, no  organomegaly Extremities-no edema in the lower extremities   Status is: Inpatient:             Meredeth Ide   [facility] If 7PM-7AM, please contact night-coverage at [email or web address], Office  [phone]   06/01/2023, 2:29 PM  LOS: 1 day

## 2023-06-01 NOTE — Progress Notes (Signed)
   06/01/23 1056  Spiritual Encounters  Type of Visit Initial  Care provided to: Patient  Conversation partners present during encounter Nurse  Referral source Clinical staff  Reason for visit Advance directives  OnCall Visit No   Chaplain Levon Hedger responded to a request to provide education about ACD tp Pt. Chaplain was able to meet and talk with Pt regarding the ACD form. Chaplain provided education about the two parts and Pt explained Chaplain that he will fill out the form once his wife arrives later today. Chaplain asked Pt to notify the Chaplain's office before signing the form. At that time, Lunette Stands will bring notary and witnesses to notarize and finalize the document.

## 2023-06-01 NOTE — Progress Notes (Signed)
SLP Cancellation Note  Patient Details Name: [name] MRN: [id number] DOB: Sep 10, 1951   Cancelled treatment:       Reason Eval/Treat Not Completed: SLP screened, Pt's speech, language and cognition are functioning at or near baseline. There are no further ST needs noted at this time, our service will sign off. Thank you,  [name] H. Romie Levee, CCC-SLP Speech Language Pathologist   Georgetta Haber 06/01/2023, 9:47 AM

## 2023-06-01 NOTE — Progress Notes (Signed)
[phone] 'S DAUGHTER BRENISHA BURTM CALLED FOR UPDATE MSH SENT TO ATTENDING TO CALL BACK WANTED TO DISCUSS MRI RESULTS

## 2023-06-01 NOTE — Care Management Important Message (Signed)
Important Message  Patient Details  Name: [name] MRN: [id number] Date of Birth: 1951/03/18   Medicare Important Message Given:  Yes     Corey Harold 06/01/2023, 11:05 AM

## 2023-06-01 NOTE — Progress Notes (Signed)
Physical Therapy Treatment Patient Details Name: [name] MRN: [id number] DOB: 03/25/1951 Today's Date: 06/01/2023   History of Present Illness [name] is a 72 y.o. 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  Patient reports she has had a bad headache and a change in his vision since Tuesday morning.  He reports he went to dialysis and when the was done with dialysis he had a headache and this change in vision.  He reports he can still feels headache but is a lot better since having taken Tylenol.  He describes the pain as a head cold reporting that he does not know how to describe it as pressure or anything else.  He reports when he bends over to pick something up his headache is worse.  He denies any fever.  Patient reports he is peripheral vision loss with the headache.  And dizziness as well.  Patient has no other complaints at this time.    PT Comments  Pt supine in bed and willing to participate with therapy today.  Pt c/o intermittent throbbing headaches through session, RN aware.  Pt mod I with bed mobility, increased time and labored movements but no assistance required.  Min A with transfer and gait training.  Pt unsteady upon standing, used RW for stability with gait.  Pt presents with left sided visual impairements with occasional walking into objects, required verbal cueing to straighten up.  EOS pt returned to bed as fatigued from sitting up good majority of day.  Call bell within reach, family present and NT in room, aware of headache for medication.     If plan is discharge home, recommend the following:     Can travel by private vehicle        Equipment Recommendations       Recommendations for Other Services       Precautions / Restrictions Precautions Precautions:  Fall Restrictions Weight Bearing Restrictions: No     Mobility  Bed Mobility Overal bed mobility: Modified Independent             General bed mobility comments: I bed mobility, increaseds time    Transfers Overall transfer level: Modified independent   Transfers: Sit to/from Stand Sit to Stand: Contact guard assist, Min assist           General transfer comment: cueing for hand placement, slightly unsteady when standing, labored movements    Ambulation/Gait Ambulation/Gait assistance: Contact guard assist, Min assist Gait Distance (Feet): 100 Feet Assistive device: Rolling walker (2 wheels) Gait Pattern/deviations: Decreased step length - right, Decreased step length - left, Decreased stance time - left, Decreased stride length, Ataxic       General Gait Details: slightly ataxic like gait with mild difficulty advancing LLE, occasional stumbling without loss of balance, limited mostly due to decreased left visual fiield   Stairs             Wheelchair Mobility     Tilt Bed    Modified Rankin (Stroke Patients Only)       Balance                                            Cognition Arousal: Alert Behavior During Therapy:  WFL for tasks assessed/performed Overall Cognitive Status: Within Functional Limits for tasks assessed                                          Exercises      General Comments        Pertinent Vitals/Pain Pain Assessment Pain Assessment: 0-10 Pain Score: 3  Pain Location: headache Pain Descriptors / Indicators: Throbbing Pain Intervention(s): Monitored during session, Patient requesting pain meds-RN notified, Limited activity within patient's tolerance    Home Living                          Prior Function            PT Goals (current goals can now be found in the care plan section)      Frequency           PT Plan      Co-evaluation               AM-PAC PT "6 Clicks" Mobility   Outcome Measure  Help needed turning from your back to your side while in a flat bed without using bedrails?: None Help needed moving from lying on your back to sitting on the side of a flat bed without using bedrails?: None Help needed moving to and from a bed to a chair (including a wheelchair)?: None Help needed standing up from a chair using your arms (e.g., wheelchair or bedside chair)?: A Little Help needed to walk in hospital room?: A Little Help needed climbing 3-5 steps with a railing? : A Little 6 Click Score: 21    End of Session Equipment Utilized During Treatment: Gait belt Activity Tolerance: Patient tolerated treatment well;Patient limited by fatigue Patient left: in chair;with call bell/phone within reach;with family/visitor present Nurse Communication: Mobility status       Time: 1610-9604 PT Time Calculation (min) (ACUTE ONLY): 20 min  Charges:    $Therapeutic Activity: 8-22 mins PT General Charges $$ ACUTE PT VISIT: 1 Visit                     Becky Sax, LPTA/CLT; CBIS [phone]  Juel Burrow 06/01/2023, 5:05 PM

## 2023-06-02 DIAGNOSIS — N186 End stage renal disease: Secondary | ICD-10-CM | POA: Diagnosis not present

## 2023-06-02 DIAGNOSIS — I63431 Cerebral infarction due to embolism of right posterior cerebral artery: Secondary | ICD-10-CM | POA: Diagnosis not present

## 2023-06-02 DIAGNOSIS — Z992 Dependence on renal dialysis: Secondary | ICD-10-CM | POA: Diagnosis not present

## 2023-06-02 DIAGNOSIS — I10 Essential (primary) hypertension: Secondary | ICD-10-CM | POA: Diagnosis not present

## 2023-06-02 LAB — CBC
HCT: 30.6 % — ABNORMAL LOW (ref 39.0–52.0)
Hemoglobin: 10.2 g/dL — ABNORMAL LOW (ref 13.0–17.0)
MCH: 29.7 pg (ref 26.0–34.0)
MCHC: 33.3 g/dL (ref 30.0–36.0)
MCV: 89 fL (ref 80.0–100.0)
Platelets: 169 10*3/uL (ref 150–400)
RBC: 3.44 MIL/uL — ABNORMAL LOW (ref 4.22–5.81)
RDW: 14.9 % (ref 11.5–15.5)
WBC: 9.9 10*3/uL (ref 4.0–10.5)
nRBC: 0 % (ref 0.0–0.2)

## 2023-06-02 LAB — RENAL FUNCTION PANEL
Albumin: 3 g/dL — ABNORMAL LOW (ref 3.5–5.0)
Anion gap: 12 (ref 5–15)
BUN: 47 mg/dL — ABNORMAL HIGH (ref 8–23)
CO2: 25 mmol/L (ref 22–32)
Calcium: 7.8 mg/dL — ABNORMAL LOW (ref 8.9–10.3)
Chloride: 92 mmol/L — ABNORMAL LOW (ref 98–111)
Creatinine, Ser: 6.12 mg/dL — ABNORMAL HIGH (ref 0.61–1.24)
GFR, Estimated: 9 mL/min — ABNORMAL LOW (ref >60)
Glucose, Bld: 186 mg/dL — ABNORMAL HIGH (ref 70–99)
Phosphorus: 5.4 mg/dL — ABNORMAL HIGH (ref 2.5–4.6)
Potassium: 3.7 mmol/L (ref 3.5–5.1)
Sodium: 129 mmol/L — ABNORMAL LOW (ref 135–145)

## 2023-06-02 LAB — URINE CULTURE: Culture: NO GROWTH

## 2023-06-02 LAB — HEPATITIS B SURFACE ANTIBODY, QUANTITATIVE: Hep B S AB Quant (Post): <3.5 m[IU]/mL — ABNORMAL LOW

## 2023-06-02 LAB — GLUCOSE, CAPILLARY
Glucose-Capillary: 132 mg/dL — ABNORMAL HIGH (ref 70–99)
Glucose-Capillary: 232 mg/dL — ABNORMAL HIGH (ref 70–99)
Glucose-Capillary: 164 mg/dL — ABNORMAL HIGH (ref 70–99)

## 2023-06-02 MED ORDER — HEPARIN SODIUM (PORCINE) 1000 UNIT/ML DIALYSIS: 20.0000 [IU]/kg | INTRAMUSCULAR | Status: DC | PRN

## 2023-06-02 MED ORDER — AMLODIPINE BESYLATE 5 MG PO TABS
5.0000 mg | ORAL_TABLET | Freq: Every day | ORAL | Status: DC
  Administered 2023-06-02 – 2023-06-03 (×2): 5 mg via ORAL
  Filled 2023-06-02 (×2): qty 1

## 2023-06-02 MED ORDER — TRAMADOL HCL 50 MG PO TABS
50.0000 mg | ORAL_TABLET | Freq: Once | ORAL | Status: AC
  Administered 2023-06-02: 50 mg via ORAL
  Filled 2023-06-02: qty 1

## 2023-06-02 MED ORDER — AMOXICILLIN-POT CLAVULANATE 875-125 MG PO TABS
1.0000 | ORAL_TABLET | Freq: Two times a day (BID) | ORAL | Status: DC
  Administered 2023-06-02 – 2023-06-03 (×3): 1 via ORAL
  Filled 2023-06-02 (×3): qty 1

## 2023-06-02 NOTE — Progress Notes (Signed)
[facility]  PROGRESS NOTE  [name] VHQ:[id number] DOB: October 14, 1950 DOA: 05/30/2023 PCP: Tommie Sams, DO  Brief hospital course 72 yr old male with history of ESRD on hemodialysis TTS, diabetes mellitus type 2, hyperlipidemia, GERD, history of stroke, hypertension came to ED with chief complaints of headache.  Also had change in vision.  Patient went to hemodialysis and after that developed headache with change in vision.  In the ED CT head showed acute infarct in the right occipital lobe. Neurology was consulted    Subjective   Complained of headache last night.  Patient says that he was having runny nose before he came to the hospital.  Has been having intermittent fevers in the hospital.  Workup has been negative so far.     Assessment and Plan:  Acute infarct in the right occipital lobe -Likely embolic infarct, MRI brain showed acute ischemic stroke in the right PCA territory -Neurology consulted, recommend aspirin and Plavix for 3 months followed by Plavix 75 mg daily -Continue atorvastatin 80 mg daily -Continue permissive hypertension for 48 hours -MRI brain showed occlusion of right PCA in the P2 segment -Echocardiogram did not show intracardiac thrombus -Will need event monitor set up as outpatient, called cardiology, they will call and set up event monitor as outpatient.  Fever -Unclear etiology -Temperature 101.3 -Blood cultures x 2 obtained, urinalysis and culture ordered -Chest x-ray clear, MRI brain negative for sinusitis -?  Viral -Urine culture obtained, blood culture x 2 are negative to date -Will start empiric Augmentin for possible sinusitis  Headache -Complained of headache last night, -Will give 1 dose of tramadol  ESRD -Patient get dialysis Tuesday Thursday and Saturday -Nephrology following   Diabetes mellitus type 2 -Continue sliding scale insulin with NovoLog -CBG well-controlled   Hyperlipidemia -Continue statin    Hypertension -Norvasc and Coreg on hold for permissive hypertension -Will start low-dose amlodipine 5 mg daily      Medications     amLODipine  5 mg Oral Daily   amoxicillin-clavulanate  1 tablet Oral Q12H   aspirin EC  81 mg Oral Daily   atorvastatin  80 mg Oral Q1500   calcitRIOL  0.25 mcg Oral Q1500   Chlorhexidine Gluconate Cloth  6 each Topical Q0600   clopidogrel  75 mg Oral Daily   heparin  5,000 Units Subcutaneous Q8H   insulin aspart  0-15 Units Subcutaneous TID WC   insulin aspart  0-5 Units Subcutaneous QHS     Data Reviewed:   CBG:  Recent Labs  Lab 06/01/23 1117 06/01/23 1656 06/01/23 2356 06/02/23 0811 06/02/23 1140  GLUCAP 137* 198* 118* 132* 232*    SpO2: 98 %    Vitals:   06/01/23 2314 06/02/23 0002 06/02/23 0133 06/02/23 0930  BP: (!) 150/75 (!) 134/99 (!) 146/65 (!) 152/86  Pulse: 82 (!) 107 93 89  Resp: 16 20 18    Temp: 99 F (37.2 C) (!) 100.9 F (38.3 C) 99.8 F (37.7 C) 99 F (37.2 C)  TempSrc: Oral Oral Oral Oral  SpO2: (!) 10% 100% 99% 98%  Weight:      Height:          Data Reviewed:  Basic Metabolic Panel: Recent Labs  Lab 05/30/23 1907 05/31/23 0312 06/01/23 0500  NA 136 134* 132*  K 3.8 3.6 4.0  CL 96* 97* 95*  CO2 26 23 21*  GLUCOSE 269* 225* 147*  BUN 50* 52* 62*  CREATININE 7.20* 6.91* 7.90*  CALCIUM 8.1* 7.4*  8.0*    CBC: Recent Labs  Lab 05/30/23 1907 05/31/23 0312 06/01/23 0916  WBC 8.3 9.1 10.0  NEUTROABS 4.8  --   --   HGB 12.3* 10.3* 10.4*  HCT 37.6* 31.4* 31.9*  MCV 91.5 90.5 90.6  PLT 171 163 177    LFT Recent Labs  Lab 05/30/23 1907 05/31/23 0312 06/01/23 0500  AST 17 14* 20  ALT 20 17 18   ALKPHOS 74 64 63  BILITOT 0.4 0.4 0.6  PROT 7.6 6.5 7.3  ALBUMIN 3.5 3.0* 3.3*         Antibiotics: Anti-infectives (From admission, onward)    Start     Dose/Rate Route Frequency Ordered Stop   06/02/23 1000  amoxicillin-clavulanate (AUGMENTIN) 875-125 MG per tablet 1 tablet         1 tablet Oral Every 12 hours 06/02/23 0831          DVT prophylaxis: Heparin  Code Status: Full code  Family Communication: Discussed with patient's daughter on phone  CONSULTS:    Objective    Physical Examination:      Status is: Inpatient:             Meredeth Ide   [facility] If 7PM-7AM, please contact night-coverage at [email or web address], Office  [phone]   06/02/2023, 12:34 PM  LOS: 2 days

## 2023-06-02 NOTE — Progress Notes (Signed)
   HEMODIALYSIS TREATMENT NOTE:   Uneventful 3 hour treatment completed without interruption in UF.  All blood was returned.    06/02/23 2020  Vitals  Temp 98 F (36.7 C)  Temp Source Oral  BP (!) 146/65  MAP (mmHg) 77  BP Location Right Arm  BP Method Automatic  Patient Position (if appropriate) Sitting  Pulse Rate 93  Pulse Rate Source Monitor  Oxygen Therapy  SpO2 100 %  O2 Device Room Air  Post Treatment  Dialyzer Clearance Lightly streaked  Hemodialysis Intake (mL) 0 mL  Liters Processed 57.9  Fluid Removed (mL) 1600 mL  Tolerated HD Treatment Yes  Post-Hemodialysis Comments Goal met  AVG/AVF Arterial Site Held (minutes) 7 minutes  AVG/AVF Venous Site Held (minutes) 7 minutes  Fistula / Graft Left Upper arm Arteriovenous vein graft  Placement Date/Time: 10/20/22 0901   Orientation: Left  Access Location: Upper arm  Access Type: Arteriovenous vein graft  Fistula / Graft Assessment Thrill;Bruit  Status Patent    Arman Filter, RN AP K[facility]

## 2023-06-02 NOTE — Progress Notes (Signed)
   06/02/23 2105  Assess: MEWS Score  Temp 98 F (36.7 C)  BP (!) 116/59  MAP (mmHg) 76  Pulse Rate (!) 113  SpO2 99 %  O2 Device Room Air  Assess: MEWS Score  MEWS Temp 0  MEWS Systolic 0  MEWS Pulse 2  MEWS RR 0  MEWS LOC 0  MEWS Score 2  MEWS Score Color Yellow  Assess: if the MEWS score is Yellow or Red  Were vital signs accurate and taken at a resting state? Yes  Does the patient meet 2 or more of the SIRS criteria? No  MEWS guidelines implemented  Yes, yellow  Treat  MEWS Interventions Considered administering scheduled or prn medications/treatments as ordered  Take Vital Signs  Increase Vital Sign Frequency  Yellow: Q2hr x1, continue Q4hrs until patient remains green for 12hrs  Escalate  MEWS: Escalate Yellow: Discuss with charge nurse and consider notifying provider and/or RRT  Notify: Charge Nurse/RN  Name of Charge Nurse/RN Notified Bree RN  Assess: SIRS CRITERIA  SIRS Temperature  0  SIRS Pulse 1  SIRS Respirations  0  SIRS WBC 0  SIRS Score Sum  1

## 2023-06-02 NOTE — Progress Notes (Signed)
   06/02/23 0002  Assess: MEWS Score  Temp (!) 100.9 F (38.3 C)  BP (!) 134/99  MAP (mmHg) 108  Pulse Rate (!) 107  Resp 20  SpO2 100 %  O2 Device Room Air  Assess: MEWS Score  MEWS Temp 1  MEWS Systolic 0  MEWS Pulse 1  MEWS RR 0  MEWS LOC 0  MEWS Score 2  MEWS Score Color Yellow  Assess: if the MEWS score is Yellow or Red  Were vital signs accurate and taken at a resting state? Yes  Does the patient meet 2 or more of the SIRS criteria? No  MEWS guidelines implemented  Yes, yellow  Treat  MEWS Interventions Considered administering scheduled or prn medications/treatments as ordered  Take Vital Signs  Increase Vital Sign Frequency  Yellow: Q2hr x1, continue Q4hrs until patient remains green for 12hrs  Escalate  MEWS: Escalate Yellow: Discuss with charge nurse and consider notifying provider and/or RRT  Notify: Charge Nurse/RN  Name of Charge Nurse/RN Notified Education officer, community  Assess: SIRS CRITERIA  SIRS Temperature  0  SIRS Pulse 1  SIRS Respirations  0  SIRS WBC 0  SIRS Score Sum  1

## 2023-06-03 DIAGNOSIS — I10 Essential (primary) hypertension: Secondary | ICD-10-CM | POA: Diagnosis not present

## 2023-06-03 DIAGNOSIS — I63431 Cerebral infarction due to embolism of right posterior cerebral artery: Secondary | ICD-10-CM | POA: Diagnosis not present

## 2023-06-03 DIAGNOSIS — N186 End stage renal disease: Secondary | ICD-10-CM | POA: Diagnosis not present

## 2023-06-03 DIAGNOSIS — E782 Mixed hyperlipidemia: Secondary | ICD-10-CM | POA: Diagnosis not present

## 2023-06-03 LAB — GLUCOSE, CAPILLARY
Glucose-Capillary: 142 mg/dL — ABNORMAL HIGH (ref 70–99)
Glucose-Capillary: 132 mg/dL — ABNORMAL HIGH (ref 70–99)

## 2023-06-03 MED ORDER — CLOPIDOGREL BISULFATE 75 MG PO TABS: 75.0000 mg | ORAL_TABLET | Freq: Every day | ORAL | 3 refills | Status: DC

## 2023-06-03 MED ORDER — AMOXICILLIN-POT CLAVULANATE 500-125 MG PO TABS: 1.0000 | ORAL_TABLET | Freq: Every day | ORAL | 0 refills | Status: DC

## 2023-06-03 MED ORDER — ASPIRIN EC 81 MG PO TBEC: 81.0000 mg | DELAYED_RELEASE_TABLET | Freq: Every day | ORAL | 0 refills | Status: DC

## 2023-06-03 MED ORDER — AMOXICILLIN-POT CLAVULANATE 875-125 MG PO TABS: 1.0000 | ORAL_TABLET | Freq: Two times a day (BID) | ORAL | 0 refills | Status: DC

## 2023-06-03 MED ORDER — AMOXICILLIN-POT CLAVULANATE 500-125 MG PO TABS: 1.0000 | ORAL_TABLET | Freq: Every day | ORAL | Status: DC

## 2023-06-03 NOTE — Discharge Instructions (Signed)
Take Augmentin 500/125, 1 tablet daily for 3 days starting from 06-04-23

## 2023-06-03 NOTE — Discharge Summary (Signed)
Physician Discharge Summary   Patient: [name] MRN: [id number] DOB: 12/04/1950  Admit date:     05/30/2023  Discharge date: 06/03/23  Discharge Physician: Meredeth Ide   PCP: [name] Sams, DO   Recommendations at discharge:   Follow-up neurology as outpatient Follow-up PCP as outpatient  Discharge Diagnoses: Principal Problem:   CVA (cerebral vascular accident) (HCC) Active Problems:   Essential hypertension   Hyperlipidemia, mixed   Type 2 diabetes mellitus with peripheral neuropathy (HCC)   ESRD (end stage renal disease) (HCC)   Stroke (HCC)  Resolved Problems:   * No resolved hospital problems. *  Hospital Course: 72 yr old male with history of ESRD on hemodialysis TTS, diabetes mellitus type 2, hyperlipidemia, GERD, history of stroke, hypertension came to ED with chief complaints of headache.  Also had change in vision.  Patient went to hemodialysis and after that developed headache with change in vision.  In the ED CT head showed acute infarct in the right occipital lobe. Neurology was consulted       Assessment and Plan:    Acute infarct in the right occipital lobe -Likely embolic infarct, MRI brain showed acute ischemic stroke in the right PCA territory -Neurology consulted, recommend aspirin and Plavix for 3 months followed by Plavix 75 mg daily -Continue atorvastatin 80 mg daily -Continue permissive hypertension for 48 hours -MRI brain showed occlusion of right PCA in the P2 segment -Echocardiogram did not show intracardiac thrombus -Will need event monitor set up as outpatient, called cardiology, they will call and set up event monitor as outpatient.   Fever -Unclear etiology -Temperature 101.3 -Blood cultures x 2 obtained, urinalysis and culture ordered -Chest x-ray clear, MRI brain negative for sinusitis -?  Viral -Urine culture obtained, blood culture x 2 are negative to date -Fever resolved after starting Augmentin.  Will continue with  Augmentin for 4 more days.   Headache -Improved after starting Augmentin    ESRD -Patient get dialysis Tuesday Thursday and Saturday -Nephrology following   Diabetes mellitus type 2 -Continue home regimen   Hyperlipidemia -Continue statin   Hypertension -Norvasc and Coreg will be restarted at discharge   Hyponatremia Mild, patient on hemodialysis       Consultants: Neurology Procedures performed:  Disposition: Home Diet recommendation:  Discharge Diet Orders (From admission, onward)     Start     Ordered   06/03/23 0000  Diet - low sodium heart healthy        06/03/23 0914           Cardiac diet DISCHARGE MEDICATION: Allergies as of 06/03/2023       Reactions   Losartan Potassium Other (See Comments)   hyperkalemia   Sildenafil Other (See Comments)   Not effective. "Did not like the way it made me feel"        Medication List     TAKE these medications    amLODipine 10 MG tablet Commonly known as: NORVASC Take 1 tablet (10 mg total) by mouth daily in the afternoon.   amoxicillin-clavulanate 875-125 MG tablet Commonly known as: AUGMENTIN Take 1 tablet by mouth every 12 (twelve) hours.   aspirin EC 81 MG tablet Take 1 tablet (81 mg total) by mouth daily. Take aspirin and Plavix together for 3 months, then stop aspirin and continue with Plavix indefinitely What changed: additional instructions   atorvastatin 80 MG tablet Commonly known as: LIPITOR Take 1 tablet (80 mg total) by mouth daily in the afternoon.  calcitRIOL 0.25 MCG capsule Commonly known as: ROCALTROL Take 0.25 mcg by mouth daily in the afternoon.   carvedilol 6.25 MG tablet Commonly known as: COREG Take 1 tablet (6.25 mg total) by mouth 2 (two) times daily.   clopidogrel 75 MG tablet Commonly known as: PLAVIX Take 1 tablet (75 mg total) by mouth daily. Start taking on: June 04, 2023   docusate sodium 100 MG capsule Commonly known as: COLACE Take 100 mg by mouth  daily.   Dulaglutide 4.5 MG/0.5ML Sopn Inject 4.5 mg into the skin every Sunday.   FreeStyle Libre 2 Sensor Misc USE TO CHECK BLOOD GLUCOSE CONTINUOUSLY. CHANGE SENSOR EVERY 14 DAYS   furosemide 80 MG tablet Commonly known as: LASIX Take 80 mg by mouth daily.   NovoLOG Mix 70/30 FlexPen (70-30) 100 UNIT/ML FlexPen Generic drug: insulin aspart protamine - aspart Inject 20 Units into the skin 2 (two) times daily with a meal. What changed: how much to take        Follow-up Information     [facility] Follow up.   Specialty: Home Health Services Why: [facility] staff will call you to schedule in home therapy visits Contact information: [address] [place] [address] [phone]                Discharge Exam: Filed Weights   05/31/23 1227  Weight: 86.2 kg   General-appears in no acute distress Heart-S1-S2, regular, no murmur auscultated Lungs-clear to auscultation bilaterally, no wheezing or crackles auscultated Abdomen-soft, nontender, no organomegaly Extremities-no edema in the lower extremities Neuro-alert, oriented x3, no focal deficit noted  Condition at discharge: good  The results of significant diagnostics from this hospitalization (including imaging, microbiology, ancillary and laboratory) are listed below for reference.   Imaging Studies: DG Chest Port 1 View  Result Date: 05/31/2023 CLINICAL DATA:  Fever EXAM: PORTABLE CHEST 1 VIEW COMPARISON:  10/19/2022 FINDINGS: Heart size and pulmonary vascularity are normal. Bullous emphysematous changes in the right upper lung with scattered fibrosis in the lungs. No airspace disease or consolidation. No pleural effusions. No pneumothorax. Mediastinal contours appear intact. Calcification of the aorta. Degenerative changes in the spine. 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 Scattered fibrosis. No active pulmonary disease. Electronically Signed   By: William  Stevens  M.D.   On: 05/31/2023 22:19   ECHOCARDIOGRAM COMPLETE BUBBLE STUDY  Result Date: 05/31/2023    ECHOCARDIOGRAM REPORT   Patient Name:   [name] Date of Exam: 05/31/2023 Medical Rec #:  [id number]     Height:       72.0 in Accession #:    [id number]    Weight:       190.0 lb Date of Birth:  01/15/1951     BSA:          2.085 m Patient Age:    72 years      BP:           128/74 mmHg Patient Gender: M             HR:           85  bpm. [facility] Procedure: 2D Echo, Color Doppler, Cardiac Doppler and Saline Contrast Bubble            Study Indications:    Stroke 434.91 / I63.9  History:        Patient has no prior history of Echocardiogram examinations.  Risk Factors:Hypertension, Dyslipidemia, Diabetes and Former                 Smoker.  Sonographer:    Aron Baba Referring Phys: [id number] ASIA B ZIERLE-GHOSH  Sonographer Comments: Image acquisition challenging due to respiratory motion. IMPRESSIONS  1. Left ventricular ejection fraction, by estimation, is 55 to 60%. The left ventricle has normal function. The left ventricle has no regional wall motion abnormalities. Left ventricular diastolic parameters are indeterminate.  2. Right ventricular systolic function is normal. The right ventricular size is normal. There is normal pulmonary artery systolic pressure.  3. The mitral valve is normal in structure. Trivial mitral valve regurgitation. No evidence of mitral stenosis.  4. The aortic valve is tricuspid. There is mild calcification of the aortic valve. There is mild thickening of the aortic valve. Aortic valve regurgitation is not visualized. No aortic stenosis is present.  5. The inferior vena cava is normal in size with greater than 50% respiratory variability, suggesting right atrial pressure of 3 mmHg.  6. Agitated saline contrast bubble study was negative, with no evidence of any interatrial shunt. FINDINGS  Left Ventricle: Left ventricular ejection fraction, by estimation,  is 55 to 60%. The left ventricle has normal function. The left ventricle has no regional wall motion abnormalities. The left ventricular internal cavity size was normal in size. There is  no left ventricular hypertrophy. Left ventricular diastolic parameters are indeterminate. Right Ventricle: The right ventricular size is normal. Right vetricular wall thickness was not well visualized. Right ventricular systolic function is normal. There is normal pulmonary artery systolic pressure. The tricuspid regurgitant velocity is 2.50 m/s, and with an assumed right atrial pressure of 3 mmHg, the estimated right ventricular systolic pressure is 28.0 mmHg. Left Atrium: Left atrial size was normal in size. Right Atrium: Right atrial size was normal in size. Pericardium: There is no evidence of pericardial effusion. Mitral Valve: The mitral valve is normal in structure. Trivial mitral valve regurgitation. No evidence of mitral valve stenosis. Tricuspid Valve: The tricuspid valve is normal in structure. Tricuspid valve regurgitation is not demonstrated. No evidence of tricuspid stenosis. Aortic Valve: The aortic valve is tricuspid. There is mild calcification of the aortic valve. There is mild thickening of the aortic valve. There is mild aortic valve annular calcification. Aortic valve regurgitation is not visualized. No aortic stenosis  is present. Aortic valve mean gradient measures 4.7 mmHg. Aortic valve peak gradient measures 9.7 mmHg. Aortic valve area, by VTI measures 2.72 cm. Pulmonic Valve: The pulmonic valve was not well visualized. Pulmonic valve regurgitation is not visualized. No evidence of pulmonic stenosis. Aorta: The aortic root and ascending aorta are structurally normal, with no evidence of dilitation. Venous: The inferior vena cava is normal in size with greater than 50% respiratory variability, suggesting right atrial pressure of 3 mmHg. IAS/Shunts: The interatrial septum was not well visualized. Agitated  saline contrast was given intravenously to evaluate for intracardiac shunting. Agitated saline contrast bubble study was negative, with no evidence of any interatrial shunt.  LEFT VENTRICLE PLAX 2D LVIDd:         4.30 cm      Diastology LVIDs:         3.10 cm      LV e' medial:    9.32 cm/s LV PW:         0.90 cm      LV E/e' medial:  11.7 LV IVS:        0.80 cm  LV e' lateral:   10.60 cm/s LVOT diam:     2.10 cm      LV E/e' lateral: 10.3 LV SV:         74 LV SV Index:   36 LVOT Area:     3.46 cm  LV Volumes (MOD) LV vol d, MOD A2C: 110.0 ml LV vol d, MOD A4C: 79.8 ml LV vol s, MOD A2C: 47.7 ml LV vol s, MOD A4C: 48.6 ml LV SV MOD A2C:     62.3 ml LV SV MOD A4C:     79.8 ml LV SV MOD BP:      47.2 ml RIGHT VENTRICLE RV S prime:     17.30 cm/s TAPSE (M-mode): 2.0 cm LEFT ATRIUM           Index        RIGHT ATRIUM           Index LA diam:      3.40 cm 1.63 cm/m   RA Area:     13.20 cm LA Vol (A2C): 56.2 ml 26.96 ml/m  RA Volume:   32.30 ml  15.49 ml/m LA Vol (A4C): 46.1 ml 22.11 ml/m  AORTIC VALVE AV Area (Vmax):    2.88 cm AV Area (Vmean):   2.81 cm AV Area (VTI):     2.72 cm AV Vmax:           155.34 cm/s AV Vmean:          102.491 cm/s AV VTI:            0.273 m AV Peak Grad:      9.7 mmHg AV Mean Grad:      4.7 mmHg LVOT Vmax:         129.00 cm/s LVOT Vmean:        83.100 cm/s LVOT VTI:          0.215 m LVOT/AV VTI ratio: 0.79  AORTA Ao Root diam: 3.30 cm Ao Asc diam:  3.30 cm MITRAL VALVE                TRICUSPID VALVE MV Area (PHT): 4.10 cm     TR Peak grad:   25.0 mmHg MV Decel Time: 185 msec     TR Vmax:        250.00 cm/s MV E velocity: 109.00 cm/s MV A velocity: 119.00 cm/s  SHUNTS MV E/A ratio:  0.92         Systemic VTI:  0.22 m                             Systemic Diam: 2.10 cm Dina Rich MD Electronically signed by Dina Rich MD Signature Date/Time: 05/31/2023/4:33:18 PM    Final    MR ANGIO HEAD WO CONTRAST  Result Date: 05/31/2023 CLINICAL DATA:  72 year old male with  neurologic deficit, headache and blurred vision, presented yesterday with evidence of right PCA infarct on head CT. EXAM: MRA HEAD WITHOUT CONTRAST TECHNIQUE: Angiographic images of the Circle of Willis were acquired using MRA technique without intravenous contrast. COMPARISON:  Brain MRI today reported separately. FINDINGS: Anterior circulation: Antegrade flow in both ICA siphons. ICA siphon irregularity suggesting atherosclerosis but no significant siphon stenosis. Ophthalmic artery origins are within normal limits. Patent carotid termini. Normal MCA and ACA origins. Anterior communicating artery and visible ACA branches are within normal limits. Left MCA M1 is patent and bifurcates early without stenosis.  Right MCA M1 and bifurcation are patent without stenosis. Visible MCA branches are within normal limits. Posterior circulation: Antegrade flow in the posterior circulation. Mild irregularity of the distal vertebral arteries (the right appears dominant) and the basilar artery without significant stenosis. Bilateral PICA and AICA origins appear patent. SCA and PCA origins are patent. Posterior communicating arteries are diminutive or absent. Left PCA branches are within normal limits. But the right PCA occludes in the P2 segment (series 1027, image 14 and series 14 image 101. Anatomic variants: Mildly dominant distal right vertebral artery. Other: Brain MRI today is reported separately. IMPRESSION: 1. Positive for occlusion of the Right PCA in the P2 segment, concordant with the Brain MRI today reported separately. 2. Evidence of intracranial atherosclerosis, but no other significant intracranial arterial stenosis. Electronically Signed   By: Odessa Fleming M.D.   On: 05/31/2023 10:22   US Carotid Bilateral (at [facility] and [facility] only)  Result Date: 05/31/2023 CLINICAL DATA:  72 year old male with a history of vision loss EXAM: BILATERAL CAROTID DUPLEX ULTRASOUND TECHNIQUE: Wallace Cullens scale imaging, color Doppler and duplex  ultrasound were performed of bilateral carotid and vertebral arteries in the neck. COMPARISON:  None Available. FINDINGS: Criteria: Quantification of carotid stenosis is based on velocity parameters that correlate the residual internal carotid diameter with NASCET-based stenosis levels, using the diameter of the distal internal carotid lumen as the denominator for stenosis measurement. The following velocity measurements were obtained: RIGHT ICA:  Systolic 95 cm/sec, Diastolic 21 cm/sec CCA:  80 cm/sec SYSTOLIC ICA/CCA RATIO:  1.2 ECA:  145 cm/sec LEFT ICA:  Systolic 87 cm/sec, Diastolic 18 cm/sec CCA:  77 cm/sec SYSTOLIC ICA/CCA RATIO:  1.1 ECA:  157 cm/sec Right Brachial SBP: Not acquired Left Brachial SBP: Not acquired RIGHT CAROTID ARTERY: No significant calcifications of the right common carotid artery. Intermediate waveform maintained. Moderate heterogeneous and partially calcified plaque at the right carotid bifurcation. No significant lumen shadowing. Low resistance waveform of the right ICA. No significant tortuosity. RIGHT VERTEBRAL ARTERY: Antegrade flow with low resistance waveform. LEFT CAROTID ARTERY: No significant calcifications of the left common carotid artery. Intermediate waveform maintained. Moderate heterogeneous and partially calcified plaque at the left carotid bifurcation. No significant lumen shadowing. Low resistance waveform of the left ICA. No significant tortuosity. LEFT VERTEBRAL ARTERY:  Antegrade flow with low resistance waveform. IMPRESSION: Color duplex indicates moderate heterogeneous and calcified plaque, with no hemodynamically significant stenosis by duplex criteria in the extracranial cerebrovascular circulation. Signed, Yvone Neu. Miachel Roux, RPVI [facility] Electronically Signed   By: [name] D.O.   On: 05/31/2023 10:06   MR BRAIN WO CONTRAST  Result Date: 05/31/2023 CLINICAL DATA:  72 year old male  with neurologic deficit, headache and blurred vision, presented yesterday with evidence of right PCA infarct on head CT. EXAM: MRI HEAD WITHOUT CONTRAST TECHNIQUE: Multiplanar, multiecho pulse sequences of the brain and surrounding structures were obtained without intravenous contrast. COMPARISON:  Head CT yesterday. Intracranial MRA today is reported separately. FINDINGS: Brain: Confluent restricted diffusion and cytotoxic edema throughout the right PCA territory including patchy involvement of the right thalamus and splenium (series 9, image 19). Minimal involvement of the right mesial temporal lobe (series 9, image 12). Confluent T2 and FLAIR hyperintensity plus gyral edema corresponding to the CT hypodensity yesterday. Early T1 hypointensity. Minimal petechial hemorrhage is possible (series 22, image 30). Mild mass effect on the atrium of the right lateral ventricle. No malignant hemorrhagic transformation and no significant intracranial mass  effect. No diffusion restriction outside of the right PCA territory. But multifocal chronic lacunar infarcts in the bilateral cerebral white matter, left corona radiata, posterior left lentiform, bilateral thalami, left central pons. Patchy and confluent additional cerebral white matter T2 and FLAIR hyperintensity in both hemispheres. No chronic cortical encephalomalacia. Multiple chronic microhemorrhages in the bilateral thalami (series 22, image 27) and a small chronic microhemorrhage also in the right parietal lobe outside the area of cytotoxic edema (image 33). No midline shift, evidence of mass lesion, ventriculomegaly, extra-axial collection. Cervicomedullary junction and pituitary are within normal limits. Vascular: Major intracranial vascular flow voids are preserved. See intracranial MRA reported separately today. Skull and upper cervical spine: Partially visible advanced cervical spine degeneration with degenerative marrow signal changes (series 13, image 14).  Calvarium and face bone marrow signal appears to remain normal. Sinuses/Orbits: Postoperative changes to the right globe, otherwise negative orbits. Paranasal Visualized paranasal sinuses and mastoids are stable and well aerated. Other: Visible internal auditory structures appear normal. Negative visible scalp and face. IMPRESSION: 1. Confluent acute to early subacute Right PCA territory infarct. Cytotoxic edema with possible minimal petechial hemorrhage. But no malignant hemorrhagic transformation or significant intracranial mass effect. 2. See intracranial MRA reported separately. 3. 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 Electronically Signed   By: Odessa Fleming M.D.   On: 05/31/2023 09:59   CT Head Wo Contrast  Addendum Date: 05/30/2023   ADDENDUM REPORT: 05/30/2023 22:08 ADDENDUM: These results were called by telephone at the time of interpretation on 05/30/2023 at 10:02 pm to provider Benjiman Core , who verbally acknowledged these results. Electronically Signed   By: Darliss Cheney M.D.   On: 05/30/2023 22:08   Result Date: 05/30/2023 CLINICAL DATA:  Acute stroke suspected. Headache and blurred vision. EXAM: CT HEAD WITHOUT CONTRAST TECHNIQUE: Contiguous axial images were obtained from the base of the skull through the vertex without intravenous contrast. RADIATION DOSE REDUCTION: This exam was performed according to the departmental dose-optimization program which includes automated exposure control, adjustment of the mA and/or kV according to patient size and/or use of iterative reconstruction technique. COMPARISON:  Head CT 09/03/2010 FINDINGS: Brain: There is hypodensity with loss of gray-white matter distinction and sulcal effacement in the posterior right occipital lobe compatible with acute infarct. There is no midline shift. There is no acute intracranial hemorrhage or acute extra-axial fluid collection. Brain volume  is age-appropriate. There is moderate periventricular and deep white matter hypodensity which has increased compared to 2017. Vascular: Atherosclerotic calcifications are present within the cavernous internal carotid arteries. Skull: Normal. Negative for fracture or focal lesion. Sinuses/Orbits: No acute finding. Other: None. IMPRESSION: 1. Acute infarct in the right occipital lobe. No acute intracranial hemorrhage. 2. Moderate chronic small vessel ischemic changes, increased compared to 2017. Electronically Signed: By: Darliss Cheney M.D. On: 05/30/2023 21:56   VAS US DUPLEX DIALYSIS ACCESS (AVF, AVG)  Result Date: 05/21/2023 DIALYSIS ACCESS Patient Name:  TAURIS FAHRENKRUG  Date of Exam:   05/16/2023 Medical Rec #: [id number]      Accession #:    [id number] Date of Birth: 05-09-1951      Patient Gender: M Patient Age:   60 years Exam Location:  [facility] Procedure:      VAS US DUPLEX DIALYSIS ACCESS (AVF, AVG) Referring Phys: Sheppard Plumber --------------------------------------------------------------------------------  Reason for Exam: Routine follow up. Access Site: Left Upper Extremity. Access Type: BrachAx AVG. History: Created 10/20/2022. Comparison Study:  01/2023 Performing Technologist: Salvadore Farber RVT  Examination Guidelines: A complete evaluation includes B-mode imaging, spectral Doppler, color Doppler, and power Doppler as needed of all accessible portions of each vessel. Unilateral testing is considered an integral part of a complete examination. Limited examinations for reoccurring indications may be performed as noted.  Findings:   +--------------------+----------+-----------------+--------+ AVG                 PSV (cm/s)Flow Vol (mL/min)Describe +--------------------+----------+-----------------+--------+ Native artery inflow   203          1854                +--------------------+----------+-----------------+--------+ Arterial anastomosis   415                               +--------------------+----------+-----------------+--------+ Prox graft             186                              +--------------------+----------+-----------------+--------+ Mid graft              122                              +--------------------+----------+-----------------+--------+ Distal graft           163                              +--------------------+----------+-----------------+--------+ Venous anastomosis     186                              +--------------------+----------+-----------------+--------+ Venous outflow          84                              +--------------------+----------+-----------------+--------+ +-------------+-------------+----------+---------+---------+-------------------+              Diameter (cm)Depth (cm)Branching   PSV       Flow Volume                                                   (cm/s)       (ml/min)       +-------------+-------------+----------+---------+---------+-------------------+ Lt Rad Art                                      36                        Dis                                                                       +-------------+-------------+----------+---------+---------+-------------------+  Summary: Patent arteriovenous graft. *See table(s) above for measurements and  observations.  Diagnosing physician: Levora Dredge MD Electronically signed by Levora Dredge MD on 05/21/2023 at 5:42:11 PM.   --------------------------------------------------------------------------------   Final     Microbiology: Results for orders placed or performed during the hospital encounter of 05/30/23  MRSA Next Gen by PCR, Nasal     Status: None   Collection Time: 05/31/23  2:50 PM   Specimen: Nasal Mucosa; Nasal Swab  Result Value Ref Range Status   MRSA by PCR Next Gen NOT DETECTED NOT DETECTED Final    Comment: (NOTE) The GeneXpert MRSA Assay (FDA approved for NASAL specimens only), is one  component of a comprehensive MRSA colonization surveillance program. It is not intended to diagnose MRSA infection nor to guide or monitor treatment for MRSA infections. Test performance is not FDA approved in patients less than 68 years old. Performed at [facility], [address], [place], [place] [address]   Culture, blood (Routine X 2) w Reflex to ID Panel     Status: None (Preliminary result)   Collection Time: 06/01/23  9:11 AM   Specimen: BLOOD  Result Value Ref Range Status   Specimen Description BLOOD BLOOD RIGHT HAND  Final   Special Requests   Final    BOTTLES DRAWN AEROBIC AND ANAEROBIC Blood Culture adequate volume   Culture   Final    NO GROWTH 2 DAYS Performed at [facility], [address] [place], [place] [address]    Report Status PENDING  Incomplete  Culture, blood (Routine X 2) w Reflex to ID Panel     Status: None (Preliminary result)   Collection Time: 06/01/23  9:16 AM   Specimen: BLOOD  Result Value Ref Range Status   Specimen Description BLOOD BLOOD RIGHT ARM  Final   Special Requests   Final    BOTTLES DRAWN AEROBIC AND ANAEROBIC Blood Culture results may not be optimal due to an excessive volume of blood received in culture bottles   Culture   Final    NO GROWTH 2 DAYS Performed at [facility], [address], [place], [place] [address]    Report Status PENDING  Incomplete  Urine Culture (for pregnant, neutropenic or urologic patients or patients with an indwelling urinary catheter)     Status: None   Collection Time: 06/01/23  2:20 PM   Specimen: Urine, Clean Catch  Result Value Ref Range Status   Specimen Description   Final    URINE, CLEAN CATCH Performed at [facility], [address], [place], [place] [address]    Special Requests   Final    NONE Performed at [facility], [address], [place], [place] [address]    Culture   Final    NO GROWTH Performed at [name], [address], [place], [place] [address]    Report Status  06/02/2023 FINAL  Final    Labs: CBC: Recent Labs  Lab 05/30/23 1907 05/31/23 0312 06/01/23 0916 06/02/23 1747  WBC 8.3 9.1 10.0 9.9  NEUTROABS 4.8  --   --   --   HGB 12.3* 10.3* 10.4* 10.2*  HCT 37.6* 31.4* 31.9* 30.6*  MCV 91.5 90.5 90.6 89.0  PLT 171 163 177 169   Basic Metabolic Panel: Recent Labs  Lab 05/30/23 1907 05/31/23 0312 06/01/23 0500 06/02/23 1747  NA 136 134* 132* 129*  K 3.8 3.6 4.0 3.7  CL 96* 97* 95* 92*  CO2 26 23 21* 25  GLUCOSE 269* 225* 147* 186*  BUN 50* 52* 62*  47*  CREATININE 7.20* 6.91* 7.90* 6.12*  CALCIUM 8.1* 7.4* 8.0* 7.8*  PHOS  --   --   --  5.4*   Liver Function Tests: Recent Labs  Lab 05/30/23 1907 05/31/23 0312 06/01/23 0500 06/02/23 1747  AST 17 14* 20  --   ALT 20 17 18   --   ALKPHOS 74 64 63  --   BILITOT 0.4 0.4 0.6  --   PROT 7.6 6.5 7.3  --   ALBUMIN 3.5 3.0* 3.3* 3.0*   CBG: Recent Labs  Lab 06/01/23 2356 06/02/23 0811 06/02/23 1140 06/02/23 2109 06/03/23 0717  GLUCAP 118* 132* 232* 164* 142*    Discharge time spent: greater than 30 minutes.  Signed: Meredeth Ide, MD [facility] 06/03/2023

## 2023-06-03 NOTE — TOC Transition Note (Signed)
Transition of Care [facility]) - CM/SW Discharge Note   Patient Details  Name: [name] MRN: [id number] Date of Birth: 1950-11-25  [facility]) CM/SW Contact:  Catalina Gravel, LCSW Phone Number: 06/03/2023, 9:57 AM   Clinical Narrative:     Patient clear for DC.  CSW contacted [facility], advising pt to DC today. CSW also assured provider info in AVS. No further TOC needs.   Final next level of care: Home w Home Health Services Barriers to Discharge: No Barriers Identified   Patient Goals and CMS Choice CMS Medicare.gov Compare Post Acute Care list provided to:: Patient Choice offered to / list presented to : Patient  Discharge Placement                         Discharge Plan and Services Additional resources added to the After Visit Summary for   In-house Referral: Clinical Social Work   Post Acute Care Choice: Home Health                    HH Arranged: PT, OT [facility] Agency: [facility] Date [facility] Agency Contacted: 05/31/23   Representative spoke with at [facility] Agency: Rolly Salter  Social Determinants of Health (SDOH) Interventions SDOH Screenings   Food Insecurity: No Food Insecurity (05/31/2023)  Housing: Low Risk  (05/31/2023)  Transportation Needs: No Transportation Needs (05/31/2023)  Utilities: Not At Risk (05/31/2023)  Depression (PHQ2-9): Medium Risk (05/10/2023)  Tobacco Use: Medium Risk (05/31/2023)     Readmission Risk Interventions     No data to display

## 2023-06-05 ENCOUNTER — Telehealth: Payer: Self-pay

## 2023-06-05 DIAGNOSIS — D631 Anemia in chronic kidney disease: Secondary | ICD-10-CM | POA: Diagnosis not present

## 2023-06-05 DIAGNOSIS — D509 Iron deficiency anemia, unspecified: Secondary | ICD-10-CM | POA: Diagnosis not present

## 2023-06-05 DIAGNOSIS — Z992 Dependence on renal dialysis: Secondary | ICD-10-CM | POA: Diagnosis not present

## 2023-06-05 DIAGNOSIS — N186 End stage renal disease: Secondary | ICD-10-CM | POA: Diagnosis not present

## 2023-06-05 NOTE — Telephone Encounter (Signed)
Pt just got out of the hospital for Stroke and is having pain with headaches and the hospital said that his primary care doctor can provide pain med for headache and he come by the office needing medication someone had to walk him in. Pt was getting Oxycodone in the hospital   [facility] [place]   I have scheduled a HFU

## 2023-06-05 NOTE — Telephone Encounter (Signed)
Spoke with pcp and he recommends tylenol for headache prn and follow up appt for in person evaluation

## 2023-06-06 ENCOUNTER — Telehealth: Payer: Self-pay | Admitting: Family Medicine

## 2023-06-06 ENCOUNTER — Encounter: Payer: Self-pay | Admitting: Family Medicine

## 2023-06-06 ENCOUNTER — Ambulatory Visit (INDEPENDENT_AMBULATORY_CARE_PROVIDER_SITE_OTHER): Payer: No Typology Code available for payment source | Admitting: Family Medicine

## 2023-06-06 ENCOUNTER — Telehealth: Payer: Self-pay

## 2023-06-06 VITALS — BP 100/58 | HR 77 | Temp 98.2°F | Wt 191.8 lb

## 2023-06-06 DIAGNOSIS — R519 Headache, unspecified: Secondary | ICD-10-CM | POA: Diagnosis not present

## 2023-06-06 DIAGNOSIS — E782 Mixed hyperlipidemia: Secondary | ICD-10-CM | POA: Diagnosis not present

## 2023-06-06 DIAGNOSIS — I10 Essential (primary) hypertension: Secondary | ICD-10-CM

## 2023-06-06 DIAGNOSIS — I63431 Cerebral infarction due to embolism of right posterior cerebral artery: Secondary | ICD-10-CM | POA: Diagnosis not present

## 2023-06-06 LAB — CULTURE, BLOOD (ROUTINE X 2)
Culture: NO GROWTH
Culture: NO GROWTH
Special Requests: ADEQUATE

## 2023-06-06 MED ORDER — TRAMADOL HCL 50 MG PO TABS
50.0000 mg | ORAL_TABLET | Freq: Two times a day (BID) | ORAL | 0 refills | Status: DC | PRN
Start: 2023-06-06 — End: 2023-08-22

## 2023-06-06 NOTE — Transitions of Care (Post Inpatient/ED Visit) (Signed)
   06/06/2023  Name: [name] MRN: [id number] DOB: Nov 06, 1950  Today's TOC FU Call Status: Today's TOC FU Call Status:: Unsuccessful Call (1st Attempt) Unsuccessful Call (1st Attempt) Date: 06/06/23  Attempted to reach the patient regarding the most recent Inpatient/ED visit.  Follow Up Plan: Additional outreach attempts will be made to reach the patient to complete the Transitions of Care (Post Inpatient/ED visit) call.   Jodelle Gross RN, BSN, CCM [facility] RN Care Coordinator/ Transitions of Care Direct Dial: [phone]  Fax: [phone]

## 2023-06-06 NOTE — Telephone Encounter (Signed)
FYI-[facility] to let you know their trying to setup physical therapy for him waiting on insurance, Plan of Care will start on 9/10

## 2023-06-06 NOTE — Patient Instructions (Signed)
Referrals placed.  Medication as needed for headache.  Follow up in 3 months.

## 2023-06-07 ENCOUNTER — Inpatient Hospital Stay: Payer: No Typology Code available for payment source | Admitting: Family Medicine

## 2023-06-07 ENCOUNTER — Telehealth: Payer: Self-pay

## 2023-06-07 DIAGNOSIS — R519 Headache, unspecified: Secondary | ICD-10-CM | POA: Insufficient documentation

## 2023-06-07 DIAGNOSIS — I639 Cerebral infarction, unspecified: Secondary | ICD-10-CM | POA: Insufficient documentation

## 2023-06-07 DIAGNOSIS — I63431 Cerebral infarction due to embolism of right posterior cerebral artery: Secondary | ICD-10-CM | POA: Insufficient documentation

## 2023-06-07 NOTE — Assessment & Plan Note (Signed)
Continue present therapy and Lipitor.  Has neurology follow-up scheduled. Arranging for cardiology follow-up so that he can have monitor placed to evaluate for A-fib.

## 2023-06-07 NOTE — Assessment & Plan Note (Deleted)
Continue present therapy and Lipitor.  Has neurology follow-up scheduled. Arranging for cardiology follow-up so that he can have monitor placed to evaluate for A-fib.

## 2023-06-07 NOTE — Assessment & Plan Note (Signed)
Tramadol as needed. May need evaluation for Temporal arteritis given age and location of headache. Defer to neurology.

## 2023-06-07 NOTE — Assessment & Plan Note (Signed)
BP well-controlled today.  Continue amlodipine and carvedilol.

## 2023-06-07 NOTE — Progress Notes (Signed)
Subjective:  Patient ID: [name], male    DOB: [birth year]  Age: 72 y.o. MRN: [id number]  CC: Follow up from Hospitalization   HPI:  72 year old male with type 2 diabetes with complications, end-stage renal disease on hemodialysis, history of amputation, history of stroke, hyperlipidemia, hypertension presents for follow-up from recent hospitalization for stroke.  Transition of care phone call was not completed.  Hospital course, notes, imaging, discharge summary reviewed.  In summary: Presented with headache and change in vision.  CT was obtained and revealed acute infarct in the right occipital lobe.  MRI revealed ischemic stroke in the PCA territory.  Concern for embolic infarct.  Patient placed on aspirin and Plavix.  He will be on dual antiplatelet therapy for 3 months.  It was recommended that patient see cardiology as an outpatient for an event monitor.  Patient had a fever during hospitalization.  Cultures were negative.  He was placed on Augmentin to cover for respiratory pathogens.  Associated headache improved.  Patient presents today for follow-up.  He has vision changes as a result of the stroke.  He has follow-up with neurology.  He does not have follow-up scheduled with cardiology.  Will need to place referral.  Patient continues to have headache.  Mild to moderate.  5/10 in severity.  Located in the right temporal region.  Patient states that he was given pain medication and hospitalization which helped with his headache.  He is requesting something for his headache today.  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) 11/10/2022   Former smoker 07/12/2022   Diabetic neuropathy (HCC) 10/28/2021   Anemia in chronic kidney disease 05/27/2020   History of  amputation of lesser toe of left foot (HCC) 08/18/2015   Hyperlipidemia, mixed 08/18/2015   Type 2 diabetes mellitus with peripheral neuropathy (HCC) 08/18/2015   Essential hypertension 04/17/2013    Social Hx   Social History   Socioeconomic History   Marital status: Married    Spouse name: [name]   Number of children: Not on file   Years of education: Not on file   Highest education level: Not on file  Occupational History   Not on file  Tobacco Use   Smoking status: Former   Smokeless tobacco: Never  Vaping Use   Vaping status: Never Used  Substance and Sexual Activity   Alcohol use: Not Currently   Drug use: Never   Sexual activity: Not on file  Other Topics Concern   Not on file  Social History Narrative   Not on file   Social Determinants of Health   Financial Resource Strain: Not on file  Food Insecurity: No Food Insecurity (05/31/2023)   Hunger Vital Sign    Worried About Running Out of Food in the Last Year: Never true    Ran Out of Food in the Last Year: Never true  Transportation Needs: No Transportation Needs (05/31/2023)   PRAPARE - Administrator, Civil Service (Medical): No    Lack of Transportation (Non-Medical): No  Physical Activity: Not on file  Stress: Not on file  Social Connections: Not on file    Review of Systems Per HPI  Objective:  BP (!) 100/58   Pulse 77   Temp 98.2 F (36.8 C) (  Temporal)   Wt 191 lb 12.8 oz (87 kg)   SpO2 100%   BMI 26.01 kg/m      06/06/2023    3:45 PM 06/03/2023    1:39 PM 06/03/2023    4:18 AM  BP/Weight  Systolic BP 100 135 141  Diastolic BP 58 61 65  Wt. (Lbs) 191.8    BMI 26.01 kg/m2      Physical Exam Vitals reviewed.  Constitutional:      General: He is not in acute distress. HENT:     Head: Normocephalic and atraumatic.  Eyes:     Conjunctiva/sclera: Conjunctivae normal.  Cardiovascular:     Rate and Rhythm: Normal rate and regular rhythm.  Pulmonary:     Effort: Pulmonary  effort is normal.     Breath sounds: Normal breath sounds. No wheezing or rales.  Neurological:     Mental Status: He is alert.     Comments: Visual field deficits noted  Psychiatric:        Mood and Affect: Mood normal.        Behavior: Behavior normal.     Lab Results  Component Value Date   WBC 9.9 06/02/2023   HGB 10.2 (L) 06/02/2023   HCT 30.6 (L) 06/02/2023   PLT 169 06/02/2023   GLUCOSE 186 (H) 06/02/2023   CHOL 95 05/31/2023   TRIG 315 (H) 05/31/2023   HDL 21 (L) 05/31/2023   LDLCALC 11 05/31/2023   ALT 18 06/01/2023   AST 20 06/01/2023   NA 129 (L) 06/02/2023   K 3.7 06/02/2023   CL 92 (L) 06/02/2023   CREATININE 6.12 (H) 06/02/2023   BUN 47 (H) 06/02/2023   CO2 25 06/02/2023   INR 1.0 05/30/2023   HGBA1C 9.1 (H) 05/30/2023     Assessment & Plan:   Problem List Items Addressed This Visit       Cardiovascular and Mediastinum   Essential hypertension    BP well-controlled today.  Continue amlodipine and carvedilol.      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  Has neurology follow-up scheduled. Arranging for cardiology follow-up so that he can have monitor placed to evaluate for A-fib.      Relevant Orders   Ambulatory referral to Cardiology     Other   Hyperlipidemia, mixed    Continue Lipitor 80.      Headache    Tramadol as needed. May need evaluation for Temporal arteritis given age and location of headache. Defer to neurology.      Relevant Medications   traMADol (ULTRAM) 50 MG tablet    Meds ordered this encounter  Medications   traMADol (ULTRAM) 50 MG tablet    Sig: Take 1 tablet (50 mg total) by mouth every 12 (twelve) hours as needed.    Dispense:  10 tablet    Refill:  0    Follow-up:  3 months  [name] [name] Simas DO [facility]

## 2023-06-07 NOTE — Transitions of Care (Post Inpatient/ED Visit) (Signed)
   06/07/2023  Name: [name] MRN: [id number] DOB: Jun 01, 1951  Today's TOC FU Call Status: Today's TOC FU Call Status:: Unsuccessful Call (2nd Attempt) Unsuccessful Call (2nd Attempt) Date: 06/07/23  Attempted to reach the patient regarding the most recent Inpatient/ED visit.  Follow Up Plan: Additional outreach attempts will be made to reach the patient to complete the Transitions of Care (Post Inpatient/ED visit) call.   Jodelle Gross RN, BSN, CCM [facility] RN Care Coordinator/ Transitions of Care Direct Dial: [phone]  Fax: [phone]

## 2023-06-07 NOTE — Assessment & Plan Note (Signed)
Continue Lipitor 80.

## 2023-06-08 ENCOUNTER — Telehealth: Payer: Self-pay

## 2023-06-08 DIAGNOSIS — Z89412 Acquired absence of left great toe: Secondary | ICD-10-CM | POA: Diagnosis not present

## 2023-06-08 DIAGNOSIS — E782 Mixed hyperlipidemia: Secondary | ICD-10-CM | POA: Diagnosis not present

## 2023-06-08 DIAGNOSIS — Z794 Long term (current) use of insulin: Secondary | ICD-10-CM | POA: Diagnosis not present

## 2023-06-08 DIAGNOSIS — Z7985 Long-term (current) use of injectable non-insulin antidiabetic drugs: Secondary | ICD-10-CM | POA: Diagnosis not present

## 2023-06-08 DIAGNOSIS — N186 End stage renal disease: Secondary | ICD-10-CM | POA: Diagnosis not present

## 2023-06-08 DIAGNOSIS — Z992 Dependence on renal dialysis: Secondary | ICD-10-CM | POA: Diagnosis not present

## 2023-06-08 DIAGNOSIS — I69312 Visuospatial deficit and spatial neglect following cerebral infarction: Secondary | ICD-10-CM | POA: Diagnosis not present

## 2023-06-08 DIAGNOSIS — E1140 Type 2 diabetes mellitus with diabetic neuropathy, unspecified: Secondary | ICD-10-CM | POA: Diagnosis not present

## 2023-06-08 DIAGNOSIS — I120 Hypertensive chronic kidney disease with stage 5 chronic kidney disease or end stage renal disease: Secondary | ICD-10-CM | POA: Diagnosis not present

## 2023-06-08 DIAGNOSIS — E1122 Type 2 diabetes mellitus with diabetic chronic kidney disease: Secondary | ICD-10-CM | POA: Diagnosis not present

## 2023-06-08 NOTE — Transitions of Care (Post Inpatient/ED Visit) (Signed)
   06/08/2023  Name: [name] MRN: [id number] DOB: [birth year]  Today's TOC FU Call Status: Today's TOC FU Call Status:: Unsuccessful Call (3rd Attempt) Unsuccessful Call (3rd Attempt) Date: 06/08/23  Attempted to reach the patient regarding the most recent Inpatient/ED visit.  Follow Up Plan: No further outreach attempts will be made at this time. We have been unable to contact the patient.  Jodelle Gross RN, BSN, CCM [facility] RN Care Coordinator/ Transitions of Care Direct Dial: [phone]  Fax: [phone]

## 2023-06-12 DIAGNOSIS — N186 End stage renal disease: Secondary | ICD-10-CM | POA: Diagnosis not present

## 2023-06-12 DIAGNOSIS — Z992 Dependence on renal dialysis: Secondary | ICD-10-CM | POA: Diagnosis not present

## 2023-06-12 NOTE — Telephone Encounter (Signed)
Was patient ever able to complete their portion?

## 2023-06-13 DIAGNOSIS — E1140 Type 2 diabetes mellitus with diabetic neuropathy, unspecified: Secondary | ICD-10-CM | POA: Diagnosis not present

## 2023-06-13 DIAGNOSIS — Z794 Long term (current) use of insulin: Secondary | ICD-10-CM | POA: Diagnosis not present

## 2023-06-13 DIAGNOSIS — E1122 Type 2 diabetes mellitus with diabetic chronic kidney disease: Secondary | ICD-10-CM | POA: Diagnosis not present

## 2023-06-13 DIAGNOSIS — E782 Mixed hyperlipidemia: Secondary | ICD-10-CM | POA: Diagnosis not present

## 2023-06-13 DIAGNOSIS — Z992 Dependence on renal dialysis: Secondary | ICD-10-CM | POA: Diagnosis not present

## 2023-06-13 DIAGNOSIS — N186 End stage renal disease: Secondary | ICD-10-CM | POA: Diagnosis not present

## 2023-06-13 DIAGNOSIS — Z89412 Acquired absence of left great toe: Secondary | ICD-10-CM | POA: Diagnosis not present

## 2023-06-13 DIAGNOSIS — I69312 Visuospatial deficit and spatial neglect following cerebral infarction: Secondary | ICD-10-CM | POA: Diagnosis not present

## 2023-06-13 DIAGNOSIS — I120 Hypertensive chronic kidney disease with stage 5 chronic kidney disease or end stage renal disease: Secondary | ICD-10-CM | POA: Diagnosis not present

## 2023-06-13 DIAGNOSIS — Z7985 Long-term (current) use of injectable non-insulin antidiabetic drugs: Secondary | ICD-10-CM | POA: Diagnosis not present

## 2023-06-13 NOTE — Telephone Encounter (Signed)
Pt pages complete. Pcp pgs faxed 06/13/23.

## 2023-06-14 DIAGNOSIS — E1122 Type 2 diabetes mellitus with diabetic chronic kidney disease: Secondary | ICD-10-CM | POA: Diagnosis not present

## 2023-06-14 DIAGNOSIS — N186 End stage renal disease: Secondary | ICD-10-CM | POA: Diagnosis not present

## 2023-06-14 DIAGNOSIS — I120 Hypertensive chronic kidney disease with stage 5 chronic kidney disease or end stage renal disease: Secondary | ICD-10-CM | POA: Diagnosis not present

## 2023-06-14 DIAGNOSIS — Z89412 Acquired absence of left great toe: Secondary | ICD-10-CM | POA: Diagnosis not present

## 2023-06-14 DIAGNOSIS — Z992 Dependence on renal dialysis: Secondary | ICD-10-CM | POA: Diagnosis not present

## 2023-06-14 DIAGNOSIS — Z794 Long term (current) use of insulin: Secondary | ICD-10-CM | POA: Diagnosis not present

## 2023-06-14 DIAGNOSIS — I69312 Visuospatial deficit and spatial neglect following cerebral infarction: Secondary | ICD-10-CM | POA: Diagnosis not present

## 2023-06-14 DIAGNOSIS — Z89422 Acquired absence of other left toe(s): Secondary | ICD-10-CM | POA: Diagnosis not present

## 2023-06-14 DIAGNOSIS — E1140 Type 2 diabetes mellitus with diabetic neuropathy, unspecified: Secondary | ICD-10-CM | POA: Diagnosis not present

## 2023-06-14 DIAGNOSIS — E782 Mixed hyperlipidemia: Secondary | ICD-10-CM | POA: Diagnosis not present

## 2023-06-14 DIAGNOSIS — K219 Gastro-esophageal reflux disease without esophagitis: Secondary | ICD-10-CM | POA: Diagnosis not present

## 2023-06-14 DIAGNOSIS — Z7985 Long-term (current) use of injectable non-insulin antidiabetic drugs: Secondary | ICD-10-CM | POA: Diagnosis not present

## 2023-06-15 ENCOUNTER — Encounter (HOSPITAL_BASED_OUTPATIENT_CLINIC_OR_DEPARTMENT_OTHER): Payer: No Typology Code available for payment source | Attending: General Surgery | Admitting: General Surgery

## 2023-06-15 DIAGNOSIS — M869 Osteomyelitis, unspecified: Secondary | ICD-10-CM | POA: Insufficient documentation

## 2023-06-15 DIAGNOSIS — I120 Hypertensive chronic kidney disease with stage 5 chronic kidney disease or end stage renal disease: Secondary | ICD-10-CM | POA: Diagnosis not present

## 2023-06-15 DIAGNOSIS — L97512 Non-pressure chronic ulcer of other part of right foot with fat layer exposed: Secondary | ICD-10-CM | POA: Diagnosis not present

## 2023-06-15 DIAGNOSIS — E1142 Type 2 diabetes mellitus with diabetic polyneuropathy: Secondary | ICD-10-CM | POA: Diagnosis not present

## 2023-06-15 DIAGNOSIS — Z794 Long term (current) use of insulin: Secondary | ICD-10-CM | POA: Diagnosis not present

## 2023-06-15 DIAGNOSIS — E11621 Type 2 diabetes mellitus with foot ulcer: Secondary | ICD-10-CM | POA: Diagnosis not present

## 2023-06-15 DIAGNOSIS — Z992 Dependence on renal dialysis: Secondary | ICD-10-CM | POA: Insufficient documentation

## 2023-06-15 DIAGNOSIS — Z87891 Personal history of nicotine dependence: Secondary | ICD-10-CM | POA: Diagnosis not present

## 2023-06-15 DIAGNOSIS — L97514 Non-pressure chronic ulcer of other part of right foot with necrosis of bone: Secondary | ICD-10-CM | POA: Diagnosis not present

## 2023-06-15 DIAGNOSIS — E1122 Type 2 diabetes mellitus with diabetic chronic kidney disease: Secondary | ICD-10-CM | POA: Diagnosis not present

## 2023-06-15 DIAGNOSIS — N186 End stage renal disease: Secondary | ICD-10-CM | POA: Insufficient documentation

## 2023-06-18 ENCOUNTER — Ambulatory Visit (INDEPENDENT_AMBULATORY_CARE_PROVIDER_SITE_OTHER): Payer: No Typology Code available for payment source | Admitting: Family Medicine

## 2023-06-18 ENCOUNTER — Encounter: Payer: Self-pay | Admitting: Family Medicine

## 2023-06-18 ENCOUNTER — Telehealth: Payer: Self-pay

## 2023-06-18 VITALS — BP 135/63 | HR 86 | Temp 98.4°F | Ht 72.0 in | Wt 194.0 lb

## 2023-06-18 DIAGNOSIS — I10 Essential (primary) hypertension: Secondary | ICD-10-CM

## 2023-06-18 NOTE — Assessment & Plan Note (Signed)
BP well-controlled.  No hypertension noted.  Advised to check blood pressure at home.  Supportive care.

## 2023-06-18 NOTE — Telephone Encounter (Signed)
BP reading obtained in office 132/63 - rx for BP cuff provided to patient

## 2023-06-18 NOTE — Progress Notes (Signed)
the wound care center on a daily basis, as well as his dialysis sessions. [name], [name] ([id number]) [id number] Page 3 of 11 02/16/2023: The wound continues to contract. There is very nice granulation tissue present and no slough or other debris accumulation. 02/22/2023: The wound is quite a bit smaller today. There is still some depth to it and I can still probe bone. 03/02/2023: The wound has contracted further. There is granulation tissue filling in the base. There is still a narrow passage through which I can probe to bone. 03/09/2023: The wound is down to just a very small hole through which I can still probe to bone. The rest of the wound has epithelialized. 03/16/2023: No real change in the wound dimensions. Bone is still exposed at the base. The edges are rolling inward, suggesting the wound has stalled. 03/26/2023: The wound is about a millimeter in diameter. A skinny probe just barely fits through the opening but I still feel bone at the base. 04/02/2023: The wound is just a tiny bit larger today. Bone is still able to be probed at the base. 04/10/2023: The wound is unchanged. The bone remains exposed. We are awaiting insurance approval for [facility]. 04/18/2023: The wound is back down to being about a millimeter in diameter, with a skinny probe barely fitting through the opening. Bone is still directly underneath the surface. No word regarding insurance approval for [facility]. 05/03/2023: The wound is stable in size, but there seems to be some tissue starting to close over the bone. 05/18/2023: The depth of the wound seems to be a little bit less today and I no longer appreciate the gritty texture of bone when I probe the wound. 06/15/2019: The patient has been absent from clinic secondary to suffering a stroke. It appears that skin  is starting to go down the sides of the small remaining divot and I do not feel bone when I probe the site. There is little slough and eschar on the surface. Electronic Signature(s) Signed: 06/15/2023 11:33:19 AM By: [name] Guess MD FACS Entered By: [name] on 06/15/2023 08:33:19 -------------------------------------------------------------------------------- Physical Exam Details Patient Name: Date of Service: [facility] [name], [name] 06/15/2023 10:45 A M Medical Record Number: [id number] Patient Account Number: [id number] Date of Birth/Sex: Treating RN: 15-Mar-1951 (72 y.o. M) Primary Care Provider: [name] Other Other Clinician: Referring Provider: Treating Provider/Extender: [name] Weeks in Treatment: 35 Constitutional .Tachycardic, asymptomatic. . . no acute distress. Respiratory Normal work of breathing on room air. Notes 06/15/2019: It appears that skin is starting to grow down the sides of the small remaining divot and I do not feel bone when I probe the site. There is little slough and eschar on the surface. Electronic Signature(s) Signed: 06/15/2023 11:34:05 AM By: [name] Guess MD FACS Entered By: [name] on 06/15/2023 08:34:05 -------------------------------------------------------------------------------- Physician Orders Details Patient Name: Date of Service: [facility], [name] 06/15/2023 10:45 A M Medical Record Number: [id number] Patient Account Number: [id number] Date of Birth/Sex: Treating RN: [birth year] (72 y.o. 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.621 Type 2 diabetes mellitus with foot ulcer N18.6 End stage renal disease M86.9  and Water 1 x Per Week/30 Days Discharge Instructions: May shower and wash wound with dial antibacterial soap and water prior to dressing change. Cleanser: Wound Cleanser 1 x Per Week/30 Days Discharge Instructions: Cleanse the wound with wound cleanser prior to applying a clean dressing using gauze sponges, not tissue or cotton balls. Topical: Gentamicin 1 x Per Week/30 Days Discharge Instructions: Apply with Mupirocin (Mix Mupirocin and Getamicin ointment) Topical: Mupirocin Ointment 1 x Per Week/30 Days Discharge Instructions: Apply Mupirocin (Bactroban) with Gentamicinas instructed Prim Dressing: Promogran Prisma Matrix, 4.34 (sq in) (silver collagen) 1 x Per Week/30 Days ary Discharge Instructions: Moisten collagen with saline or hydrogel Secondary Dressing: Woven Gauze Sponges 2x2 in 1 x Per Week/30 Days Discharge Instructions: Apply over primary dressing as directed. Secured With: Insurance underwriter, Sterile 2x75 (in/in) 1 x Per Week/30 Days Discharge Instructions: Secure with stretch gauze as directed. Secured With: 12M Medipore Scientist, research (life sciences) Surgical T 2x10 (in/yd) 1 x Per Week/30 Days ape Discharge Instructions: Secure with tape as directed. 06/15/2019: The patient has been absent from clinic secondary to suffering a stroke. It appears that skin is starting to go down the sides of the small remaining divot and I do not feel bone when I probe the site. There is little slough and eschar on the surface. I used a curette to debride the slough and eschar from the wound site. He was doing well with at the cord but we completed his course of 10 applications. We will check his insurance to see  if [facility] or [facility] might be paid for. Meantime we will continue mupirocin ointment with Prisma silver collagen. Follow-up in 2 weeks. Electronic Signature(s) Signed: 06/15/2023 11:35:28 AM By: [name] Guess MD FACS Entered By: [name] on 06/15/2023 08:35:28 -------------------------------------------------------------------------------- HxROS Details Patient Name: Date of Service: [facility] [name], [name] 06/15/2023 10:45 A M Medical Record Number: [id number] Patient Account Number: [id number] Date of Birth/Sex: Treating RN: [birth year] (72 y.o. M) Primary Care Provider: [name] Other Other Clinician: Referring Provider: Treating Provider/Extender: [name] Weeks in Treatment: [address] Information Obtained From Patient SEQUOYAH, CLYATT ([id number]) [id number] Page 9 of 11 Eyes Medical History: Positive for: Cataracts - rt eye Ear/Nose/Mouth/Throat Medical History: Negative for: Chronic sinus problems/congestion Hematologic/Lymphatic Medical History: Negative for: Anemia; Hemophilia; Human Immunodeficiency Virus; Lymphedema; Sickle Cell Disease Respiratory Medical History: Negative for: Aspiration; Asthma; Chronic Obstructive Pulmonary Disease (COPD); Pneumothorax; Tuberculosis Cardiovascular Medical History: Positive for: Hypertension Gastrointestinal Medical History: Negative for: Cirrhosis ; Colitis; Crohns; Hepatitis A; Hepatitis B; Hepatitis C Endocrine Medical History: Positive for: Type II Diabetes Time with diabetes: 2000 Treated with: Insulin Blood sugar tested every day: Yes Tested : Genitourinary Medical History: Negative for: End Stage Renal Disease Past Medical History Notes: CKD IV Immunological Medical History: Negative for: Lupus Erythematosus; Raynauds; Scleroderma Integumentary (Skin) Medical History: Negative for: History of Burn Musculoskeletal Medical History: Positive for: Osteomyelitis - MRI confirmed  (08/16/22) Negative for: Gout Neurologic Medical History: Positive for: Neuropathy Psychiatric Medical History: Positive for: Confinement Anxiety Negative for: Anorexia/bulimia HBO Extended History Items Eyes: Cataracts Immunizations Bentler, Kamau ([id number]) [id number] Page 10 of 11 Pneumococcal Vaccine: Received Pneumococcal Vaccination: Yes Received Pneumococcal Vaccination On or After 60th Birthday: Yes Implantable Devices None Hospitalization / Surgery History Type of Hospitalization/Surgery AV fistula place left upper arm 10/20/22 Family and Social History Cancer: No; Diabetes: Yes - Mother,Siblings; Heart Disease: Yes - Siblings; Hereditary Spherocytosis: No; Hypertension: Yes - Siblings; Kidney Disease: No; Lung Disease: No; Seizures: Yes - Father; Stroke: Yes -  Siblings; Thyroid Problems: No; Tuberculosis: No; Former smoker; Marital Status - Married; Alcohol Use: Rarely; Drug Use: No History; Caffeine Use: Daily; Financial Concerns: No; Food, Clothing or Shelter Needs: No; Support System Lacking: No; Transportation Concerns: No Electronic Signature(s) Signed: 06/15/2023 12:02:33 PM By: [name] Guess MD FACS Entered By: [name] on 06/15/2023 08:33:26 -------------------------------------------------------------------------------- SuperBill Details Patient Name: Date of Service: [facility] [name], [name] 06/15/2023 Medical Record Number: [id number] Patient Account Number: [id number] Date of Birth/Sex: Treating RN: [birth year] (72 y.o. 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.621 Type 2 diabetes mellitus with foot ulcer N18.6 End stage renal disease M86.9 Osteomyelitis, unspecified E11.42  Type 2 diabetes mellitus with diabetic polyneuropathy I10 Essential (primary) hypertension Facility Procedures : CPT4 Code: [id number] Description: 97597 - DEBRIDE WOUND 1ST 20 SQ CM OR < ICD-10 Diagnosis Description L97.514 Non-pressure chronic ulcer of other part of right foot with necrosis of bone Modifier: Quantity: 1 Physician Procedures : CPT4 Code Description Modifier [id number] 99214 - WC PHYS LEVEL 4 - EST PT 25 ICD-10 Diagnosis Description 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  the wound care center on a daily basis, as well as his dialysis sessions. [name], [name] ([id number]) [id number] Page 3 of 11 02/16/2023: The wound continues to contract. There is very nice granulation tissue present and no slough or other debris accumulation. 02/22/2023: The wound is quite a bit smaller today. There is still some depth to it and I can still probe bone. 03/02/2023: The wound has contracted further. There is granulation tissue filling in the base. There is still a narrow passage through which I can probe to bone. 03/09/2023: The wound is down to just a very small hole through which I can still probe to bone. The rest of the wound has epithelialized. 03/16/2023: No real change in the wound dimensions. Bone is still exposed at the base. The edges are rolling inward, suggesting the wound has stalled. 03/26/2023: The wound is about a millimeter in diameter. A skinny probe just barely fits through the opening but I still feel bone at the base. 04/02/2023: The wound is just a tiny bit larger today. Bone is still able to be probed at the base. 04/10/2023: The wound is unchanged. The bone remains exposed. We are awaiting insurance approval for [facility]. 04/18/2023: The wound is back down to being about a millimeter in diameter, with a skinny probe barely fitting through the opening. Bone is still directly underneath the surface. No word regarding insurance approval for [facility]. 05/03/2023: The wound is stable in size, but there seems to be some tissue starting to close over the bone. 05/18/2023: The depth of the wound seems to be a little bit less today and I no longer appreciate the gritty texture of bone when I probe the wound. 06/15/2019: The patient has been absent from clinic secondary to suffering a stroke. It appears that skin  is starting to go down the sides of the small remaining divot and I do not feel bone when I probe the site. There is little slough and eschar on the surface. Electronic Signature(s) Signed: 06/15/2023 11:33:19 AM By: [name] Guess MD FACS Entered By: [name] on 06/15/2023 08:33:19 -------------------------------------------------------------------------------- Physical Exam Details Patient Name: Date of Service: [facility] [name], [name] 06/15/2023 10:45 A M Medical Record Number: [id number] Patient Account Number: [id number] Date of Birth/Sex: Treating RN: 15-Mar-1951 (72 y.o. M) Primary Care Provider: [name] Other Other Clinician: Referring Provider: Treating Provider/Extender: [name] Weeks in Treatment: 35 Constitutional .Tachycardic, asymptomatic. . . no acute distress. Respiratory Normal work of breathing on room air. Notes 06/15/2019: It appears that skin is starting to grow down the sides of the small remaining divot and I do not feel bone when I probe the site. There is little slough and eschar on the surface. Electronic Signature(s) Signed: 06/15/2023 11:34:05 AM By: [name] Guess MD FACS Entered By: [name] on 06/15/2023 08:34:05 -------------------------------------------------------------------------------- Physician Orders Details Patient Name: Date of Service: [facility], [name] 06/15/2023 10:45 A M Medical Record Number: [id number] Patient Account Number: [id number] Date of Birth/Sex: Treating RN: [birth year] (72 y.o. 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.621 Type 2 diabetes mellitus with foot ulcer N18.6 End stage renal disease M86.9  Fleischmanns, Jaxston ([id number]) [id number] Page 1 of 11 Visit Report for 06/15/2023 Chief Complaint Document Details Patient Name: Date of Service: [facility] [name], [name] 06/15/2023 10:45 A M Medical Record Number: [id number] Patient Account Number: [id number] Date of Birth/Sex: Treating RN: [birth year] (72 y.o. M) Primary Care Provider: [name] Other Other Clinician: Referring Provider: Treating Provider/Extender: [name] Weeks in Treatment: 35 Information Obtained from: Patient Chief Complaint Patients presents for treatment of an open diabetic ulcer on the right great toe Electronic Signature(s) Signed: 06/15/2023 11:32:05 AM By: [name] Guess MD FACS Entered By: [name] on 06/15/2023 08:32:05 -------------------------------------------------------------------------------- Debridement Details Patient Name: Date of Service: [facility] [name], [name] 06/15/2023 10:45 A M Medical Record Number: [id number] Patient Account Number: [id number] Date of Birth/Sex: Treating RN: [birth year] (72 y.o. Yates Decamp Primary Care Provider: [name] Other Other Clinician: Referring Provider: Treating Provider/Extender: [name] Weeks in Treatment: 35 Debridement Performed for Assessment: Wound #1 Right,Medial T Great oe Performed By: Physician [name] Guess, MD The following information was scribed by: Brenton Grills The information was scribed for: [name] Debridement Type: Debridement Severity of Tissue Pre Debridement: Fat layer exposed Level of Consciousness (Pre-procedure): Awake and Alert Pre-procedure Verification/Time Out Yes - 11:25 Taken: Start Time: 11:26 Pain Control: Lidocaine 4% Topical Solution Percent of Wound Bed Debrided: 100% T Area Debrided (cm): otal 0.03 Tissue and other material debrided: Slough, Slough Level: Non-Viable Tissue Debridement Description: Selective/Open Wound Instrument:  Curette Bleeding: Minimum Hemostasis Achieved: Pressure Response to Treatment: Procedure was tolerated well Level of Consciousness (Post- Awake and Alert procedure): Post Debridement Measurements of Total Wound Length: (cm) 0.2 Width: (cm) 0.2 Depth: (cm) 0.2 Volume: (cm) 0.006 Character of Wound/Ulcer Post Debridement: Stable Severity of Tissue Post Debridement: Fat layer exposed Prather, Marquese ([id number]) [id number] Page 2 of 11 Post Procedure Diagnosis Same as Pre-procedure Electronic Signature(s) Signed: 06/15/2023 12:02:33 PM By: [name] Guess MD FACS Signed: 06/18/2023 12:35:20 PM By: Brenton Grills Entered By: Brenton Grills on 06/15/2023 08:27:27 -------------------------------------------------------------------------------- HPI Details Patient Name: Date of Service: [facility] [name], [name] 06/15/2023 10:45 A M Medical Record Number: [id number] Patient Account Number: [id number] Date of Birth/Sex: Treating RN: [birth year] (72 y.o. M) Primary Care Provider: [name] Other Other Clinician: Referring Provider: Treating Provider/Extender: [name] Weeks in Treatment: 35 History of Present Illness HPI Description: ADMISSION 10/12/2022 This is a 72 year old type II diabetic (last hemoglobin A1c 7.7 in October 2023) with stage V chronic kidney disease and hypertension. He has been followed by podiatry for an ulcer on his right great toe. He has a history of prior toe and foot infections requiring amputation. In November 2023, he had an outpatient MRI that demonstrated osteomyelitis. He was admitted to the hospital for IV antibiotics and subsequently underwent irrigation and debridement of the toe with biopsy and placement of married 3 layer matrix. Cultures grew out MRSA and osteomyelitis was seen on pathology. He was followed by infectious disease and given a course of doxycycline and Augmentin. He completed this course at the end of  December. He has been evaluated by vascular surgery with formal ABIs as well as consultation. He does have adequate blood flow at least to the dorsalis pedis. He last saw podiatry on January 4 of this year where he underwent debridement. It was felt he would benefit from specialized wound care and he was referred to our center for further evaluation and management. On the dorsal aspect of his right great toe, there is a circular ulcer. There is  and Water 1 x Per Week/30 Days Discharge Instructions: May shower and wash wound with dial antibacterial soap and water prior to dressing change. Cleanser: Wound Cleanser 1 x Per Week/30 Days Discharge Instructions: Cleanse the wound with wound cleanser prior to applying a clean dressing using gauze sponges, not tissue or cotton balls. Topical: Gentamicin 1 x Per Week/30 Days Discharge Instructions: Apply with Mupirocin (Mix Mupirocin and Getamicin ointment) Topical: Mupirocin Ointment 1 x Per Week/30 Days Discharge Instructions: Apply Mupirocin (Bactroban) with Gentamicinas instructed Prim Dressing: Promogran Prisma Matrix, 4.34 (sq in) (silver collagen) 1 x Per Week/30 Days ary Discharge Instructions: Moisten collagen with saline or hydrogel Secondary Dressing: Woven Gauze Sponges 2x2 in 1 x Per Week/30 Days Discharge Instructions: Apply over primary dressing as directed. Secured With: Insurance underwriter, Sterile 2x75 (in/in) 1 x Per Week/30 Days Discharge Instructions: Secure with stretch gauze as directed. Secured With: 12M Medipore Scientist, research (life sciences) Surgical T 2x10 (in/yd) 1 x Per Week/30 Days ape Discharge Instructions: Secure with tape as directed. 06/15/2019: The patient has been absent from clinic secondary to suffering a stroke. It appears that skin is starting to go down the sides of the small remaining divot and I do not feel bone when I probe the site. There is little slough and eschar on the surface. I used a curette to debride the slough and eschar from the wound site. He was doing well with at the cord but we completed his course of 10 applications. We will check his insurance to see  if [facility] or [facility] might be paid for. Meantime we will continue mupirocin ointment with Prisma silver collagen. Follow-up in 2 weeks. Electronic Signature(s) Signed: 06/15/2023 11:35:28 AM By: [name] Guess MD FACS Entered By: [name] on 06/15/2023 08:35:28 -------------------------------------------------------------------------------- HxROS Details Patient Name: Date of Service: [facility] [name], [name] 06/15/2023 10:45 A M Medical Record Number: [id number] Patient Account Number: [id number] Date of Birth/Sex: Treating RN: [birth year] (72 y.o. M) Primary Care Provider: [name] Other Other Clinician: Referring Provider: Treating Provider/Extender: [name] Weeks in Treatment: [address] Information Obtained From Patient SEQUOYAH, CLYATT ([id number]) [id number] Page 9 of 11 Eyes Medical History: Positive for: Cataracts - rt eye Ear/Nose/Mouth/Throat Medical History: Negative for: Chronic sinus problems/congestion Hematologic/Lymphatic Medical History: Negative for: Anemia; Hemophilia; Human Immunodeficiency Virus; Lymphedema; Sickle Cell Disease Respiratory Medical History: Negative for: Aspiration; Asthma; Chronic Obstructive Pulmonary Disease (COPD); Pneumothorax; Tuberculosis Cardiovascular Medical History: Positive for: Hypertension Gastrointestinal Medical History: Negative for: Cirrhosis ; Colitis; Crohns; Hepatitis A; Hepatitis B; Hepatitis C Endocrine Medical History: Positive for: Type II Diabetes Time with diabetes: 2000 Treated with: Insulin Blood sugar tested every day: Yes Tested : Genitourinary Medical History: Negative for: End Stage Renal Disease Past Medical History Notes: CKD IV Immunological Medical History: Negative for: Lupus Erythematosus; Raynauds; Scleroderma Integumentary (Skin) Medical History: Negative for: History of Burn Musculoskeletal Medical History: Positive for: Osteomyelitis - MRI confirmed  (08/16/22) Negative for: Gout Neurologic Medical History: Positive for: Neuropathy Psychiatric Medical History: Positive for: Confinement Anxiety Negative for: Anorexia/bulimia HBO Extended History Items Eyes: Cataracts Immunizations Bentler, Kamau ([id number]) [id number] Page 10 of 11 Pneumococcal Vaccine: Received Pneumococcal Vaccination: Yes Received Pneumococcal Vaccination On or After 60th Birthday: Yes Implantable Devices None Hospitalization / Surgery History Type of Hospitalization/Surgery AV fistula place left upper arm 10/20/22 Family and Social History Cancer: No; Diabetes: Yes - Mother,Siblings; Heart Disease: Yes - Siblings; Hereditary Spherocytosis: No; Hypertension: Yes - Siblings; Kidney Disease: No; Lung Disease: No; Seizures: Yes - Father; Stroke: Yes -  the wound care center on a daily basis, as well as his dialysis sessions. [name], [name] ([id number]) [id number] Page 3 of 11 02/16/2023: The wound continues to contract. There is very nice granulation tissue present and no slough or other debris accumulation. 02/22/2023: The wound is quite a bit smaller today. There is still some depth to it and I can still probe bone. 03/02/2023: The wound has contracted further. There is granulation tissue filling in the base. There is still a narrow passage through which I can probe to bone. 03/09/2023: The wound is down to just a very small hole through which I can still probe to bone. The rest of the wound has epithelialized. 03/16/2023: No real change in the wound dimensions. Bone is still exposed at the base. The edges are rolling inward, suggesting the wound has stalled. 03/26/2023: The wound is about a millimeter in diameter. A skinny probe just barely fits through the opening but I still feel bone at the base. 04/02/2023: The wound is just a tiny bit larger today. Bone is still able to be probed at the base. 04/10/2023: The wound is unchanged. The bone remains exposed. We are awaiting insurance approval for [facility]. 04/18/2023: The wound is back down to being about a millimeter in diameter, with a skinny probe barely fitting through the opening. Bone is still directly underneath the surface. No word regarding insurance approval for [facility]. 05/03/2023: The wound is stable in size, but there seems to be some tissue starting to close over the bone. 05/18/2023: The depth of the wound seems to be a little bit less today and I no longer appreciate the gritty texture of bone when I probe the wound. 06/15/2019: The patient has been absent from clinic secondary to suffering a stroke. It appears that skin  is starting to go down the sides of the small remaining divot and I do not feel bone when I probe the site. There is little slough and eschar on the surface. Electronic Signature(s) Signed: 06/15/2023 11:33:19 AM By: [name] Guess MD FACS Entered By: [name] on 06/15/2023 08:33:19 -------------------------------------------------------------------------------- Physical Exam Details Patient Name: Date of Service: [facility] [name], [name] 06/15/2023 10:45 A M Medical Record Number: [id number] Patient Account Number: [id number] Date of Birth/Sex: Treating RN: 15-Mar-1951 (72 y.o. M) Primary Care Provider: [name] Other Other Clinician: Referring Provider: Treating Provider/Extender: [name] Weeks in Treatment: 35 Constitutional .Tachycardic, asymptomatic. . . no acute distress. Respiratory Normal work of breathing on room air. Notes 06/15/2019: It appears that skin is starting to grow down the sides of the small remaining divot and I do not feel bone when I probe the site. There is little slough and eschar on the surface. Electronic Signature(s) Signed: 06/15/2023 11:34:05 AM By: [name] Guess MD FACS Entered By: [name] on 06/15/2023 08:34:05 -------------------------------------------------------------------------------- Physician Orders Details Patient Name: Date of Service: [facility], [name] 06/15/2023 10:45 A M Medical Record Number: [id number] Patient Account Number: [id number] Date of Birth/Sex: Treating RN: [birth year] (72 y.o. 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 [name], [name] ([id number]) [id number] Page 3 of 11 02/16/2023: The wound continues to contract. There is very nice granulation tissue present and no slough or other debris accumulation. 02/22/2023: The wound is quite a bit smaller today. There is still some depth to it and I can still probe bone. 03/02/2023: The wound has contracted further. There is granulation tissue filling in the base. There is still a narrow passage through which I can probe to bone. 03/09/2023: The wound is down to just a very small hole through which I can still probe to bone. The rest of the wound has epithelialized. 03/16/2023: No real change in the wound dimensions. Bone is still exposed at the base. The edges are rolling inward, suggesting the wound has stalled. 03/26/2023: The wound is about a millimeter in diameter. A skinny probe just barely fits through the opening but I still feel bone at the base. 04/02/2023: The wound is just a tiny bit larger today. Bone is still able to be probed at the base. 04/10/2023: The wound is unchanged. The bone remains exposed. We are awaiting insurance approval for [facility]. 04/18/2023: The wound is back down to being about a millimeter in diameter, with a skinny probe barely fitting through the opening. Bone is still directly underneath the surface. No word regarding insurance approval for [facility]. 05/03/2023: The wound is stable in size, but there seems to be some tissue starting to close over the bone. 05/18/2023: The depth of the wound seems to be a little bit less today and I no longer appreciate the gritty texture of bone when I probe the wound. 06/15/2019: The patient has been absent from clinic secondary to suffering a stroke. It appears that skin  is starting to go down the sides of the small remaining divot and I do not feel bone when I probe the site. There is little slough and eschar on the surface. Electronic Signature(s) Signed: 06/15/2023 11:33:19 AM By: [name] Guess MD FACS Entered By: [name] on 06/15/2023 08:33:19 -------------------------------------------------------------------------------- Physical Exam Details Patient Name: Date of Service: [facility] [name], [name] 06/15/2023 10:45 A M Medical Record Number: [id number] Patient Account Number: [id number] Date of Birth/Sex: Treating RN: 15-Mar-1951 (72 y.o. M) Primary Care Provider: [name] Other Other Clinician: Referring Provider: Treating Provider/Extender: [name] Weeks in Treatment: 35 Constitutional .Tachycardic, asymptomatic. . . no acute distress. Respiratory Normal work of breathing on room air. Notes 06/15/2019: It appears that skin is starting to grow down the sides of the small remaining divot and I do not feel bone when I probe the site. There is little slough and eschar on the surface. Electronic Signature(s) Signed: 06/15/2023 11:34:05 AM By: [name] Guess MD FACS Entered By: [name] on 06/15/2023 08:34:05 -------------------------------------------------------------------------------- Physician Orders Details Patient Name: Date of Service: [facility], [name] 06/15/2023 10:45 A M Medical Record Number: [id number] Patient Account Number: [id number] Date of Birth/Sex: Treating RN: [birth year] (72 y.o. 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.621 Type 2 diabetes mellitus with foot ulcer N18.6 End stage renal disease M86.9  Siblings; Thyroid Problems: No; Tuberculosis: No; Former smoker; Marital Status - Married; Alcohol Use: Rarely; Drug Use: No History; Caffeine Use: Daily; Financial Concerns: No; Food, Clothing or Shelter Needs: No; Support System Lacking: No; Transportation Concerns: No Electronic Signature(s) Signed: 06/15/2023 12:02:33 PM By: [name] Guess MD FACS Entered By: [name] on 06/15/2023 08:33:26 -------------------------------------------------------------------------------- SuperBill Details Patient Name: Date of Service: [facility] [name], [name] 06/15/2023 Medical Record Number: [id number] Patient Account Number: [id number] Date of Birth/Sex: Treating RN: [birth year] (72 y.o. 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.621 Type 2 diabetes mellitus with foot ulcer N18.6 End stage renal disease M86.9 Osteomyelitis, unspecified E11.42  Type 2 diabetes mellitus with diabetic polyneuropathy I10 Essential (primary) hypertension Facility Procedures : CPT4 Code: [id number] Description: 97597 - DEBRIDE WOUND 1ST 20 SQ CM OR < ICD-10 Diagnosis Description L97.514 Non-pressure chronic ulcer of other part of right foot with necrosis of bone Modifier: Quantity: 1 Physician Procedures : CPT4 Code Description Modifier [id number] - WC PHYS LEVEL 4 - EST PT 25 ICD-10 Diagnosis Description 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

## 2023-06-18 NOTE — Telephone Encounter (Signed)
Call received from [facility] PT [facility]. Tacey Ruiz reporting a BP reading of 142/with an ongoing dyastolic below 20 please advise- [phone]

## 2023-06-18 NOTE — Progress Notes (Signed)
Responses: State content correctly Electronic Signature(s) Signed: 06/18/2023 12:35:20 PM By: Brenton Grills Entered By: Brenton Grills on 06/15/2023 08:19:21 -------------------------------------------------------------------------------- Wound Assessment Details Patient Name: Date of Service: [facility], RO  [name] 06/15/2023 10:45 A M Medical Record Number: [id number] Patient Account Number: [id number] Date of Birth/Sex: Treating RN: Jun 20, 1951 (72 y.o. Yates Decamp Primary Care Creighton Longley: [name] Other Other Clinician: Referring Banner Huckaba: Treating Yaasir Menken/Extender: [name] [place], [name] ([id number]) [id number] Page 6 of 7 Weeks in Treatment: 35 Wound Status Wound Number: 1 Primary Diabetic Wound/Ulcer of the Lower Extremity Etiology: Wound Location: Right, Medial T Great oe Secondary Acute Osteomyelitis Wounding Event: Pressure Injury Etiology: Date Acquired: 08/16/2022 Wound Open Weeks Of Treatment: 35 Status: Clustered Wound: No Comorbid Cataracts, Hypertension, Type II Diabetes, Osteomyelitis, History: Neuropathy, Confinement Anxiety Wound Measurements Length: (cm) 0.2 Width: (cm) 0.2 Depth: (cm) 0.2 Area: (cm) 0.031 Volume: (cm) 0.006 % Reduction in Area: 88.7% % Reduction in Volume: 94.5% Epithelialization: Medium (34-66%) Tunneling: No Undermining: No Wound Description Classification: Grade 3 Wound Margin: Distinct, outline attached Exudate Amount: Small Exudate Type: Serosanguineous Exudate Color: red, brown Foul Odor After Cleansing: No Slough/Fibrino Yes Wound Bed Granulation Amount: Large (67-100%) Exposed Structure Granulation Quality: Red Fascia Exposed: No Necrotic Amount: Small (1-33%) Fat Layer (Subcutaneous Tissue) Exposed: Yes Necrotic Quality: Adherent Slough Tendon Exposed: No Muscle Exposed: No Joint Exposed: No Bone Exposed: Yes Periwound Skin Texture Texture Color No Abnormalities Noted: No No Abnormalities Noted: Yes Callus: Yes Temperature / Pain Temperature: No Abnormality Moisture No Abnormalities Noted: Yes Treatment Notes Wound #1 (Toe Great) Wound Laterality: Right, Medial Cleanser Soap and Water Discharge Instruction: May shower and wash wound with dial antibacterial soap and  water prior to dressing change. Wound Cleanser Discharge Instruction: Cleanse the wound with wound cleanser prior to applying a clean dressing using gauze sponges, not tissue or cotton balls. Peri-Wound Care Topical Gentamicin Discharge Instruction: Apply with Mupirocin (Mix Mupirocin and Getamicin ointment) Mupirocin Ointment Discharge Instruction: Apply Mupirocin (Bactroban) with Gentamicinas instructed Primary Dressing Promogran Prisma Matrix, 4.34 (sq in) (silver collagen) Discharge Instruction: Moisten collagen with saline or hydrogel Secondary Dressing Woven Gauze Sponges 2x2 in Discharge Instruction: Apply over primary dressing as directed. Secured With Conforming Stretch Gauze Bandage, Sterile 2x75 (in/in) Discharge Instruction: Secure with stretch gauze as directed. 10M Medipore Soft Cloth Surgical T 2x10 (in/yd) ape Discharge Instruction: Secure with tape as directed. [place], [place] ([id number]) [id number] Page 7 of 7 Compression Wrap Compression Stockings Add-Ons Electronic Signature(s) Signed: 06/18/2023 12:35:20 PM By: Brenton Grills Entered By: Brenton Grills on 06/15/2023 08:18:22 -------------------------------------------------------------------------------- Vitals Details Patient Name: Date of Service: [facility] [name], [name] 06/15/2023 10:45 A M Medical Record Number: [id number] Patient Account Number: [id number] Date of Birth/Sex: Treating RN: 04/26/1951 (72 y.o. Yates Decamp Primary Care Yashas Camilli: [name] Other Other Clinician: Referring Ceana Fiala: Treating Taiwana Willison/Extender: [name] Weeks in Treatment: 35 Vital Signs Time Taken: 11:13 Temperature (F): 98 Height (in): 72 Pulse (bpm): 111 Weight (lbs): 185 Respiratory Rate (breaths/min): 18 Body Mass Index (BMI): 25.1 Blood Pressure (mmHg): 134/64 Reference Range: 80 - 120 mg / dl Electronic Signature(s) Signed: 06/18/2023 12:35:20 PM By: Brenton Grills Entered By: Brenton Grills on 06/15/2023 08:14:10  Open Wound Status: No Wound Recurrence: 0.2x0.2x0.2 Measurements L x W x D (cm) 0.031 A (cm) : rea 0.006 Volume (cm) : 88.70% % Reduction in A rea: 94.50% %  Reduction in Volume: Grade 3 Classification: Small Exudate A mount: Serosanguineous Exudate Type: red, brown Exudate Color: Distinct, outline attached Wound Margin: Large (67-100%) Granulation A mount: Red Granulation Quality: Small (1-33%) Necrotic A  mount: Fat Layer (Subcutaneous Tissue): Yes N/A Exposed Structures: Bone: Yes Fascia: No Tendon: No Muscle: No Joint: No Medium (34-66%) Epithelialization: Debridement - Selective/Open Wound N/A Debridement: Pre-procedure Verification/Time Out 11:25  Taken: Lidocaine 4% Topical Solution Pain Control: Slough Tissue Debrided: Non-Viable Tissue Level: 0.03 Debridement A (sq cm): rea Curette Instrument: Minimum Bleeding: Pressure Hemostasis A chieved: Procedure was tolerated well Debridement Treatment  Response: 0.2x0.2x0.2 Post Debridement Measurements L x W x D (cm) 0.006 Post Debridement Volume: (cm) Callus: Yes Periwound Skin Texture: Maceration: Yes Periwound Skin Moisture: No  Abnormalities Noted Periwound Skin Color: No Abnormality Temperature:  Debridement Procedures Performed:] [N/A:N/A N/A N/A N/A N/A N/A N/A N/A N/A N/A N/A N/A N/A N/A N/A N/A N/A N/A N/A N/A N/A N/A N/A N/A N/A N/A N/A N/A N/A N/A N/A N/A N/A N/A N/A N/A N/A N/A N/A N/A] Treatment Notes Electronic Signature(s) Signed: 06/15/2023 11:31:56 AM By: [name] MD FACS Entered By: [name] on 06/15/2023 08:31:56 -------------------------------------------------------------------------------- Multi-Disciplinary Care Plan Details Patient Name: Date of Service: [facility] [name], [name] 06/15/2023 10:45 A M Medical Record Number: [id number] Patient Account Number: [id number] Date of Birth/Sex: Treating RN: Mar 13, 1951 (72 y.o. Yates Decamp Primary Care Cortasia Screws: [name] Other Other Clinician: Referring Beldon Nowling: Treating Hamlet Lasecki/Extender: [name] Weeks in Treatment: [address], [name] ([id number]) [id number] Page 4 of 7 Multidisciplinary Care Plan reviewed with physician Active Inactive Nutrition Nursing Diagnoses: Impaired glucose control: actual or potential Goals: Patient/caregiver will maintain therapeutic glucose control Date Initiated: 10/12/2022 Target Resolution Date: 06/02/2023 Goal Status: Active Interventions: Provide education on elevated blood sugars and impact on wound healing Provide education on nutrition Treatment Activities: Dietary management education, guidance and counseling : 10/12/2022 Giving encouragement to exercise : 10/12/2022 Notes: Osteomyelitis Nursing Diagnoses: Infection: osteomyelitis Goals: Patient/caregiver will verbalize understanding of disease process and disease management Date Initiated: 10/12/2022 Target Resolution Date: 06/02/2023 Goal Status: Active Interventions: Assess for signs and symptoms of osteomyelitis resolution every visit Provide education on osteomyelitis Treatment Activities: MRI  : 08/16/2022 Notes: Electronic Signature(s) Signed: 06/18/2023 12:35:20 PM By: Brenton Grills Entered By: Brenton Grills on 06/15/2023 08:19:05 -------------------------------------------------------------------------------- Pain Assessment Details Patient Name: Date of Service: [facility], [name] 06/15/2023 10:45 A M Medical Record Number: [id number] Patient Account Number: [id number] Date of Birth/Sex: Treating RN: March 09, 1951 (72 y.o. Yates Decamp Primary Care Jeanifer Halliday: [name] Other Other Clinician: Referring Shakeria Robinette: Treating Sarahann Horrell/Extender: [name] Weeks in Treatment: 35 Active Problems Location of Pain Severity and Description of Pain Patient Has Paino No Site Locations [place], [name] ([id number]) [id number] Page 5 of 7 Pain Management and Medication Current Pain Management: Electronic Signature(s) Signed: 06/18/2023 12:35:20 PM By: Brenton Grills Entered By: Brenton Grills on 06/15/2023 08:14:24 -------------------------------------------------------------------------------- Patient/Caregiver Education Details Patient Name: Date of Service: [name], [name] 9/13/2024andnbsp10:45 A M Medical Record Number: [id number] Patient Account Number: [id number] Date of Birth/Gender: Treating RN: 1951/04/02 (72 y.o. Yates Decamp Primary Care Physician: [name] Other Other Clinician: Referring Physician: Treating Physician/Extender: Gracy Racer in Treatment: 35 Education Assessment Education Provided To: Patient and Caregiver Education Topics Provided Wound/Skin Impairment: Methods: Explain/Verbal  Open Wound Status: No Wound Recurrence: 0.2x0.2x0.2 Measurements L x W x D (cm) 0.031 A (cm) : rea 0.006 Volume (cm) : 88.70% % Reduction in A rea: 94.50% %  Reduction in Volume: Grade 3 Classification: Small Exudate A mount: Serosanguineous Exudate Type: red, brown Exudate Color: Distinct, outline attached Wound Margin: Large (67-100%) Granulation A mount: Red Granulation Quality: Small (1-33%) Necrotic A  mount: Fat Layer (Subcutaneous Tissue): Yes N/A Exposed Structures: Bone: Yes Fascia: No Tendon: No Muscle: No Joint: No Medium (34-66%) Epithelialization: Debridement - Selective/Open Wound N/A Debridement: Pre-procedure Verification/Time Out 11:25  Taken: Lidocaine 4% Topical Solution Pain Control: Slough Tissue Debrided: Non-Viable Tissue Level: 0.03 Debridement A (sq cm): rea Curette Instrument: Minimum Bleeding: Pressure Hemostasis A chieved: Procedure was tolerated well Debridement Treatment  Response: 0.2x0.2x0.2 Post Debridement Measurements L x W x D (cm) 0.006 Post Debridement Volume: (cm) Callus: Yes Periwound Skin Texture: Maceration: Yes Periwound Skin Moisture: No  Abnormalities Noted Periwound Skin Color: No Abnormality Temperature:  Debridement Procedures Performed:] [N/A:N/A N/A N/A N/A N/A N/A N/A N/A N/A N/A N/A N/A N/A N/A N/A N/A N/A N/A N/A N/A N/A N/A N/A N/A N/A N/A N/A N/A N/A N/A N/A N/A N/A N/A N/A N/A N/A N/A N/A N/A] Treatment Notes Electronic Signature(s) Signed: 06/15/2023 11:31:56 AM By: [name] MD FACS Entered By: [name] on 06/15/2023 08:31:56 -------------------------------------------------------------------------------- Multi-Disciplinary Care Plan Details Patient Name: Date of Service: [facility] [name], [name] 06/15/2023 10:45 A M Medical Record Number: [id number] Patient Account Number: [id number] Date of Birth/Sex: Treating RN: Mar 13, 1951 (72 y.o. Yates Decamp Primary Care Cortasia Screws: [name] Other Other Clinician: Referring Beldon Nowling: Treating Hamlet Lasecki/Extender: [name] Weeks in Treatment: [address], [name] ([id number]) [id number] Page 4 of 7 Multidisciplinary Care Plan reviewed with physician Active Inactive Nutrition Nursing Diagnoses: Impaired glucose control: actual or potential Goals: Patient/caregiver will maintain therapeutic glucose control Date Initiated: 10/12/2022 Target Resolution Date: 06/02/2023 Goal Status: Active Interventions: Provide education on elevated blood sugars and impact on wound healing Provide education on nutrition Treatment Activities: Dietary management education, guidance and counseling : 10/12/2022 Giving encouragement to exercise : 10/12/2022 Notes: Osteomyelitis Nursing Diagnoses: Infection: osteomyelitis Goals: Patient/caregiver will verbalize understanding of disease process and disease management Date Initiated: 10/12/2022 Target Resolution Date: 06/02/2023 Goal Status: Active Interventions: Assess for signs and symptoms of osteomyelitis resolution every visit Provide education on osteomyelitis Treatment Activities: MRI  : 08/16/2022 Notes: Electronic Signature(s) Signed: 06/18/2023 12:35:20 PM By: Brenton Grills Entered By: Brenton Grills on 06/15/2023 08:19:05 -------------------------------------------------------------------------------- Pain Assessment Details Patient Name: Date of Service: [facility], [name] 06/15/2023 10:45 A M Medical Record Number: [id number] Patient Account Number: [id number] Date of Birth/Sex: Treating RN: March 09, 1951 (72 y.o. Yates Decamp Primary Care Jeanifer Halliday: [name] Other Other Clinician: Referring Shakeria Robinette: Treating Sarahann Horrell/Extender: [name] Weeks in Treatment: 35 Active Problems Location of Pain Severity and Description of Pain Patient Has Paino No Site Locations [place], [name] ([id number]) [id number] Page 5 of 7 Pain Management and Medication Current Pain Management: Electronic Signature(s) Signed: 06/18/2023 12:35:20 PM By: Brenton Grills Entered By: Brenton Grills on 06/15/2023 08:14:24 -------------------------------------------------------------------------------- Patient/Caregiver Education Details Patient Name: Date of Service: [name], [name] 9/13/2024andnbsp10:45 A M Medical Record Number: [id number] Patient Account Number: [id number] Date of Birth/Gender: Treating RN: 1951/04/02 (72 y.o. Yates Decamp Primary Care Physician: [name] Other Other Clinician: Referring Physician: Treating Physician/Extender: Gracy Racer in Treatment: 35 Education Assessment Education Provided To: Patient and Caregiver Education Topics Provided Wound/Skin Impairment: Methods: Explain/Verbal

## 2023-06-18 NOTE — Progress Notes (Signed)
Subjective:  Patient ID: [name], male    DOB: Oct 08, 1950  Age: 72 y.o. MRN: [id number]  CC: ? Low BP   HPI:  72 year old male presents for evaluation of the above.  We received a call from home health PT assistant stating that his diastolic blood pressure was below 20.  Given this, we advised him to go to the ER.  However, nursing staff thought it be best that we confirm.  He is here today to check his blood pressure.  He is well-appearing.  He has asymptomatic.  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) 11/10/2022   Former smoker 07/12/2022   Diabetic neuropathy (HCC) 10/28/2021   Anemia in chronic kidney disease 05/27/2020   History of amputation of lesser toe of left foot (HCC) 08/18/2015   Hyperlipidemia, mixed 08/18/2015   Type 2 diabetes mellitus with peripheral neuropathy (HCC) 08/18/2015   Essential hypertension 04/17/2013    Social Hx   Social History   Socioeconomic History   Marital status: Married    Spouse name: [name]   Number of children: Not on file   Years of education: Not on file   Highest education level: Not on file  Occupational History   Not on file  Tobacco Use   Smoking status: Former   Smokeless tobacco: Never  Vaping Use   Vaping status: Never Used  Substance and Sexual Activity   Alcohol use: Not Currently   Drug use: Never   Sexual activity: Not on file  Other Topics Concern   Not on file  Social History Narrative   Not on file   Social Determinants of Health   Financial Resource Strain: Not on file  Food Insecurity: No Food Insecurity (05/31/2023)   Hunger Vital Sign    Worried About Running Out of Food in the Last Year: Never true    Ran Out of Food in the Last Year: Never true  Transportation  Needs: No Transportation Needs (05/31/2023)   PRAPARE - Administrator, Civil Service (Medical): No    Lack of Transportation (Non-Medical): No  Physical Activity: Not on file  Stress: Not on file  Social Connections: Not on file    Review of Systems Per HPI  Objective:  BP 135/63   Pulse 86   Temp 98.4 F (36.9 C)   Ht 6' (1.829 m)   Wt 194 lb (88 kg)   SpO2 98%   BMI 26.31 kg/m      06/18/2023    4:13 PM 06/06/2023    3:45 PM 06/03/2023    1:39 PM  BP/Weight  Systolic BP 135 100 135  Diastolic BP 63 58 61  Wt. (Lbs) 194 191.8   BMI 26.31 kg/m2 26.01 kg/m2     Physical Exam Vitals and nursing note reviewed.  Constitutional:      Appearance: Normal appearance.  Cardiovascular:     Rate and Rhythm: Normal rate and regular rhythm.  Pulmonary:     Effort: Pulmonary effort is normal.     Breath sounds: Normal breath sounds.  Neurological:     Mental Status: He is alert.     Lab Results  Component Value Date   WBC 9.9 06/02/2023   HGB 10.2 (L) 06/02/2023   HCT  30.6 (L) 06/02/2023   PLT 169 06/02/2023   GLUCOSE 186 (H) 06/02/2023   CHOL 95 05/31/2023   TRIG 315 (H) 05/31/2023   HDL 21 (L) 05/31/2023   LDLCALC 11 05/31/2023   ALT 18 06/01/2023   AST 20 06/01/2023   NA 129 (L) 06/02/2023   K 3.7 06/02/2023   CL 92 (L) 06/02/2023   CREATININE 6.12 (H) 06/02/2023   BUN 47 (H) 06/02/2023   CO2 25 06/02/2023   INR 1.0 05/30/2023   HGBA1C 9.1 (H) 05/30/2023     Assessment & Plan:   Problem List Items Addressed This Visit       Cardiovascular and Mediastinum   Essential hypertension - Primary    BP well-controlled.  No hypertension noted.  Advised to check blood pressure at home.  Supportive care.       [name] Other DO [facility]

## 2023-06-19 IMAGING — MR MR FOOT*L* W/O CM
4 of 5 series · 18 of 40 positions shown · non-contrast
Comparison: Left foot radiographs 10/06/2021 and 10/10/2021, MRI
left forefoot 10/11/2021

CLINICAL DATA: Osteomyelitis. Status post left foot irrigation and
debridement as well as amputation of the left first metatarsal.
Admitted [DATE] for sepsis. Status post amputation of left first
metatarsal 10/12/2021.

EXAM:
MRI OF THE LEFT FOOT WITHOUT CONTRAST
TECHNIQUE: Multiplanar, multisequence MR imaging of the left forefoot was
performed. No intravenous contrast was administered.

[Series 3: T1 · coronal · 3.0mm · 0.29mm/px · 3 of 42 slices shown (1 of 2)]
[im 8/42]
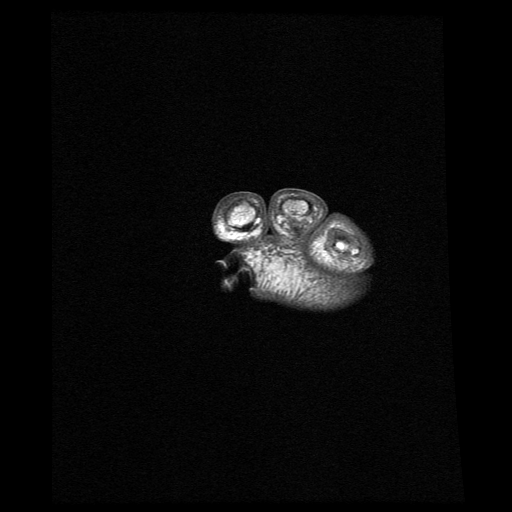
[im 23/42]
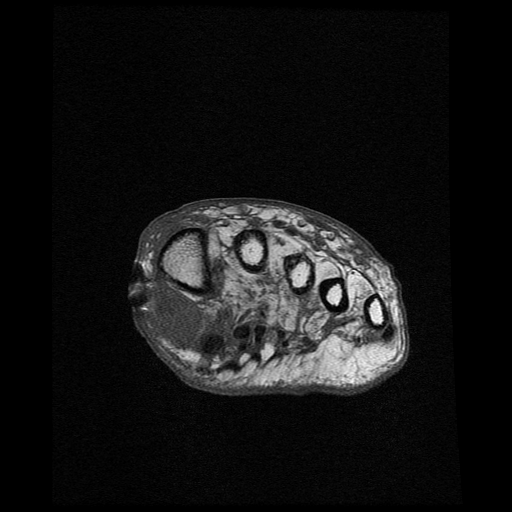
[im 38/42]
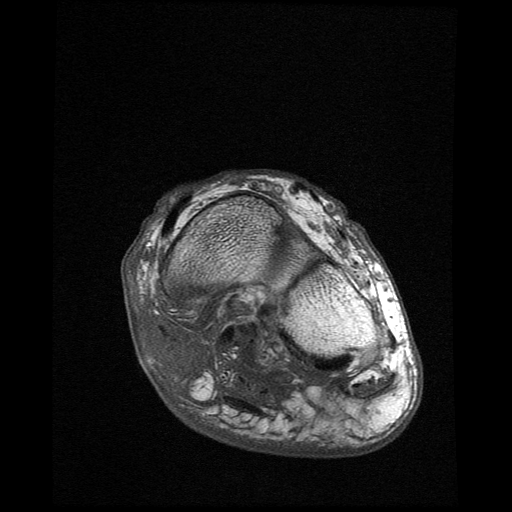

[Series 4: T2 fat-sat · coronal · 3.0mm · 0.29mm/px · 9 of 42 slices shown (1 of 2)]
[im 1/42]
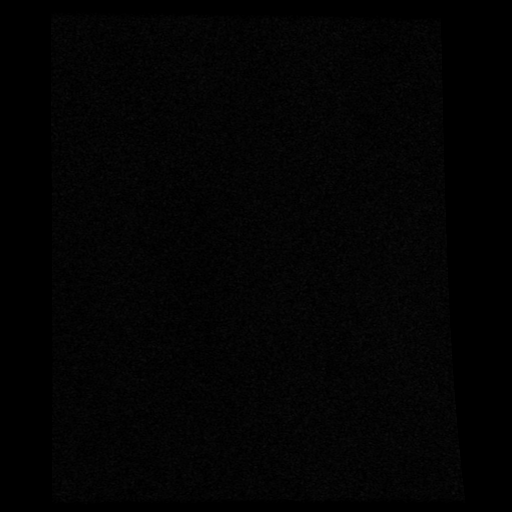
[im 5/42]
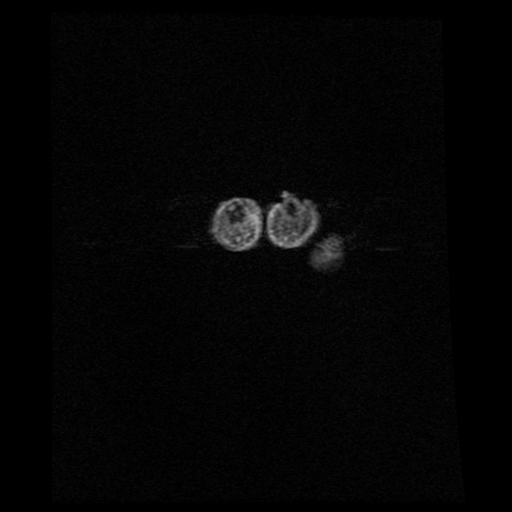
[im 9/42]
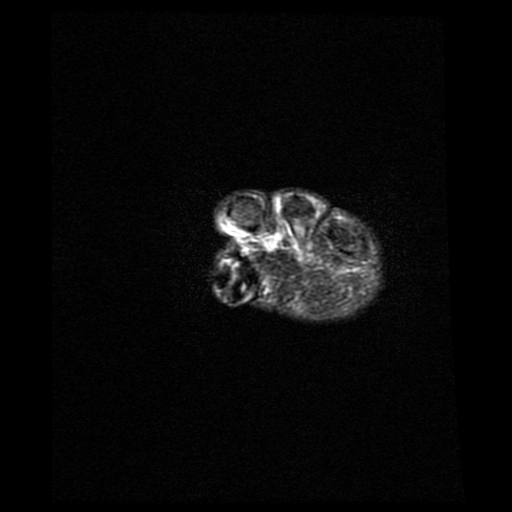
[im 13/42]
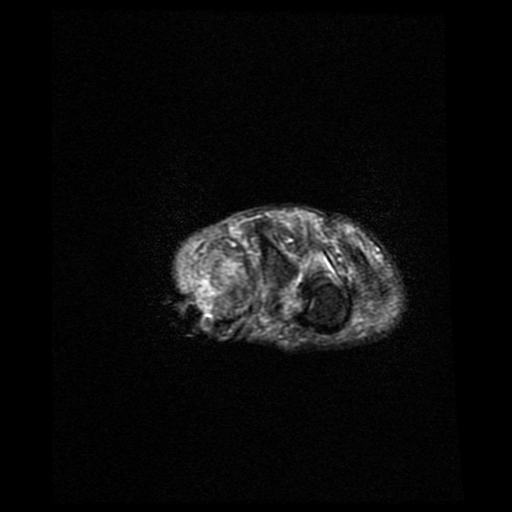
[im 17/42]
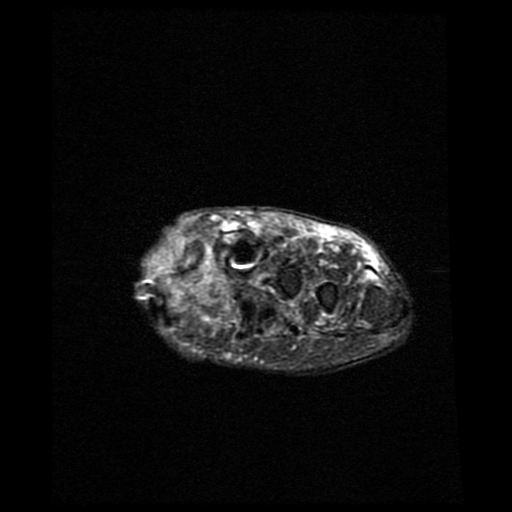
[im 21/42]
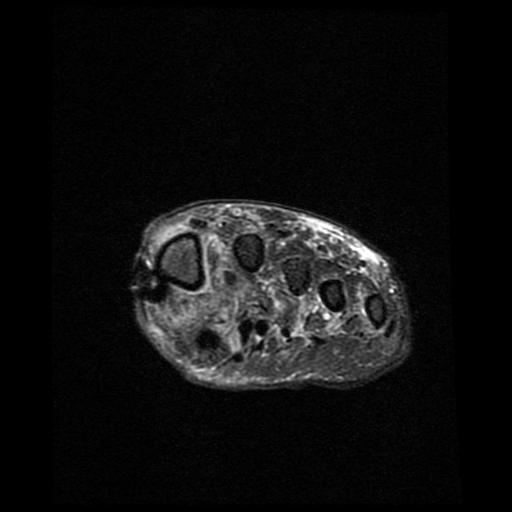
[im 25/42]
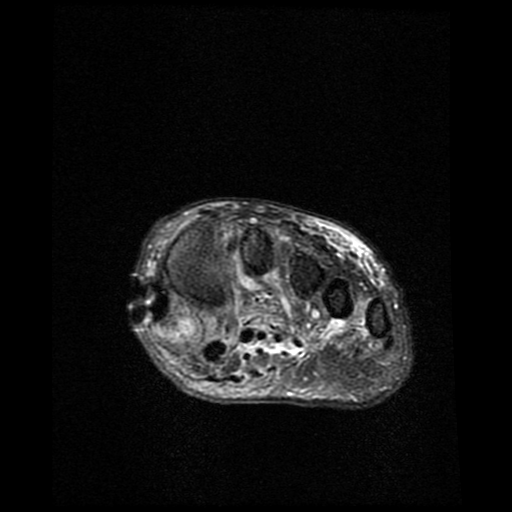
[im 29/42]
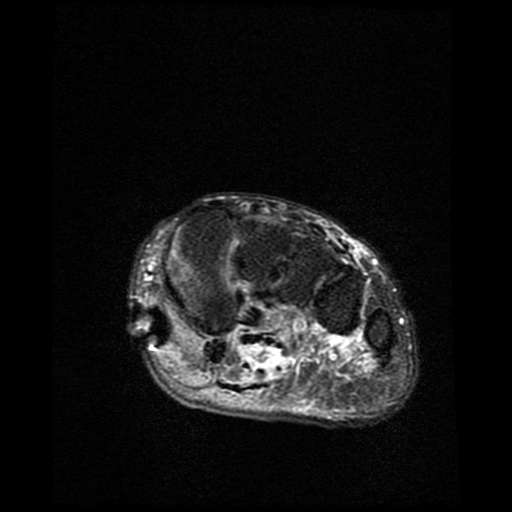
[im 37/42]
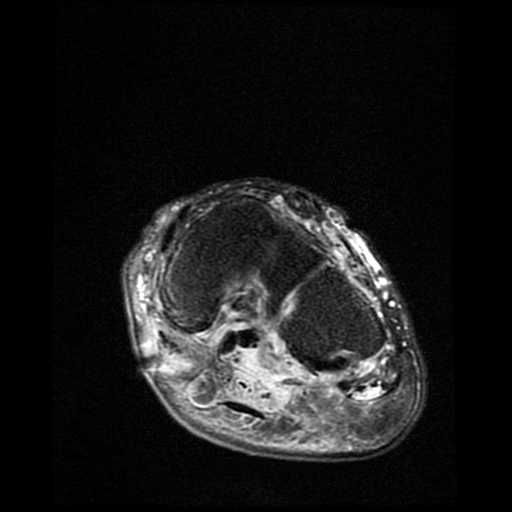

[Series 5: T1 · axial · 3.0mm · 0.29mm/px · z∈[-79,-13]mm · 3 of 21 slices shown (2 of 2)]
[im 1/21]
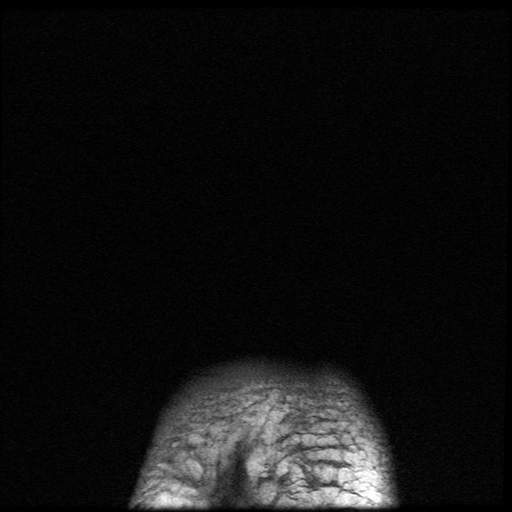
[im 11/21]
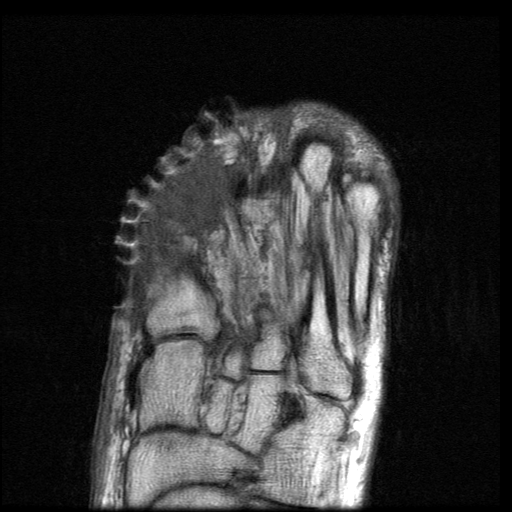
[im 21/21]
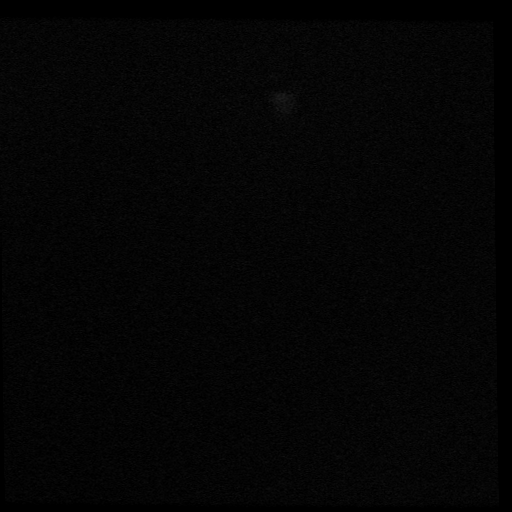

[Series 6: T2 fat-sat · axial · 3.0mm · 0.29mm/px · z∈[-79,-13]mm · 3 of 21 slices shown (2 of 2)]
[im 1/21]
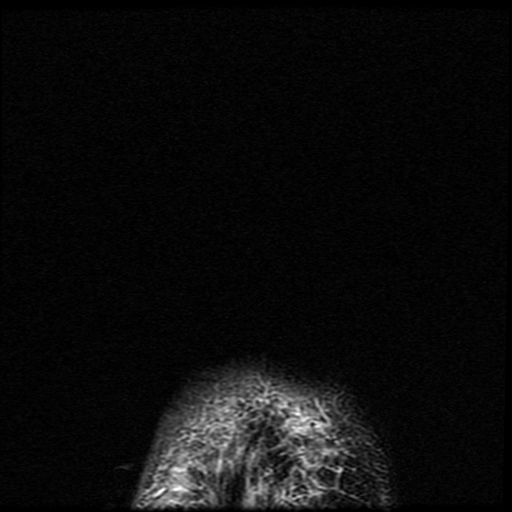
[im 11/21]
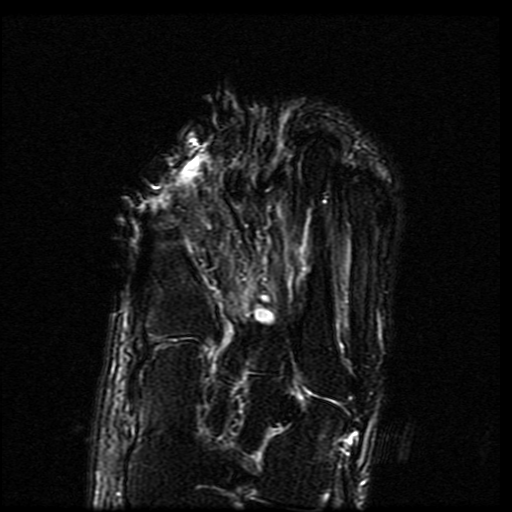
[im 21/21]
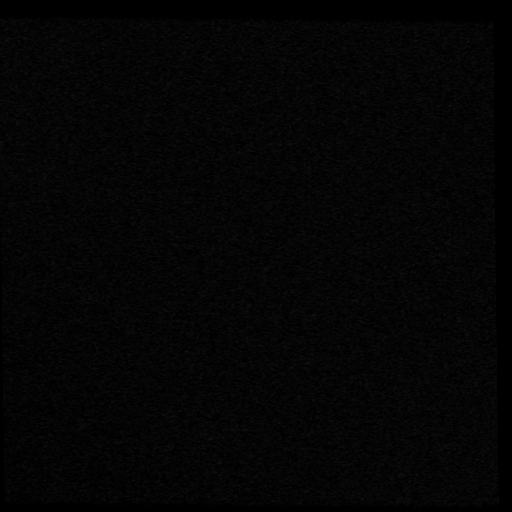

[18 of 40 positions shown; findings below may reference images not displayed]

FINDINGS: Bones/Joint/Cartilage

There is metallic artifact seen throughout the distal medial
forefoot in the region of new interval first ray amputation compared
to 10/10/2021. There is now amputation to the distal shaft of the
first metatarsal, where previously there was amputation to the
proximal aspect of the proximal phalanx. There is moderate regional
soft tissue swelling of the mid to distal medial aspect of the foot
and surrounding the postsurgical aspect of the resected distal first
metatarsal. There is mild expected erosion of the stump of the
postsurgical first metatarsal shaft consistent with recent surgery.
Mild marrow edema throughout the remaining shaft of the first
metatarsal. There is mild fluid within the soft tissues at the
distal amputation stump.

There is metallic artifact overlying the distal shaft of second
metatarsal where there is again amputation to this level.

There is again partial amputation of the third metatarsal head and
adjacent base of the proximal phalanx of the third toe.

Ligaments

The Lisfranc ligament complex is intact.

Muscles and Tendons

No tendon tear is identified.

Soft tissues

Mild edema within the plantar foot musculature. Mild soft tissue
edema and swelling of the dorsal lateral midfoot.
IMPRESSION: :
IMPRESSION: 1. Interval further amputation of the first ray, now to the distal
shaft. Expected postoperative changes including soft tissue swelling
and mild fluid around the amputation stump.
2. More remote amputations of the distal second and third
metatarsals, as above.

## 2023-06-26 ENCOUNTER — Telehealth: Payer: Self-pay

## 2023-06-26 NOTE — Telephone Encounter (Signed)
Submitted application for TRULICITY 4MG /0.5ML to [employer] for patient assistance.    (Trulicity 4.5mg )  Phone: [phone]

## 2023-06-29 ENCOUNTER — Encounter (HOSPITAL_BASED_OUTPATIENT_CLINIC_OR_DEPARTMENT_OTHER): Payer: No Typology Code available for payment source | Admitting: General Surgery

## 2023-06-29 NOTE — Telephone Encounter (Signed)
Patient already enrolled for 2024 year.  Pt hasn't been on auto refill & still is not. Nothing has went out to patient since January. He will need to call refills each time.  Lilly did use the RX from this application and send it over to [employer] for processing on 06/27/23. Shipment should arrive to patient in 7-10 days.  Pt will still need to submit another application after 07/17/23 for re-enrollment.

## 2023-07-02 DIAGNOSIS — Z992 Dependence on renal dialysis: Secondary | ICD-10-CM | POA: Diagnosis not present

## 2023-07-02 DIAGNOSIS — N186 End stage renal disease: Secondary | ICD-10-CM | POA: Diagnosis not present

## 2023-07-03 DIAGNOSIS — D509 Iron deficiency anemia, unspecified: Secondary | ICD-10-CM | POA: Diagnosis not present

## 2023-07-03 DIAGNOSIS — Z992 Dependence on renal dialysis: Secondary | ICD-10-CM | POA: Diagnosis not present

## 2023-07-03 DIAGNOSIS — Z23 Encounter for immunization: Secondary | ICD-10-CM | POA: Diagnosis not present

## 2023-07-03 DIAGNOSIS — D631 Anemia in chronic kidney disease: Secondary | ICD-10-CM | POA: Diagnosis not present

## 2023-07-03 DIAGNOSIS — N186 End stage renal disease: Secondary | ICD-10-CM | POA: Diagnosis not present

## 2023-07-10 ENCOUNTER — Encounter (HOSPITAL_BASED_OUTPATIENT_CLINIC_OR_DEPARTMENT_OTHER): Payer: No Typology Code available for payment source | Admitting: General Surgery

## 2023-07-10 ENCOUNTER — Ambulatory Visit: Payer: No Typology Code available for payment source | Admitting: Internal Medicine

## 2023-07-11 ENCOUNTER — Encounter (HOSPITAL_BASED_OUTPATIENT_CLINIC_OR_DEPARTMENT_OTHER): Payer: Self-pay

## 2023-07-11 ENCOUNTER — Ambulatory Visit (HOSPITAL_BASED_OUTPATIENT_CLINIC_OR_DEPARTMENT_OTHER): Payer: No Typology Code available for payment source | Admitting: General Surgery

## 2023-07-11 ENCOUNTER — Other Ambulatory Visit: Payer: Self-pay

## 2023-07-11 DIAGNOSIS — E1122 Type 2 diabetes mellitus with diabetic chronic kidney disease: Secondary | ICD-10-CM | POA: Diagnosis not present

## 2023-07-11 DIAGNOSIS — Z794 Long term (current) use of insulin: Secondary | ICD-10-CM | POA: Diagnosis not present

## 2023-07-11 DIAGNOSIS — I69312 Visuospatial deficit and spatial neglect following cerebral infarction: Secondary | ICD-10-CM | POA: Diagnosis not present

## 2023-07-11 DIAGNOSIS — E1140 Type 2 diabetes mellitus with diabetic neuropathy, unspecified: Secondary | ICD-10-CM | POA: Diagnosis not present

## 2023-07-11 DIAGNOSIS — I120 Hypertensive chronic kidney disease with stage 5 chronic kidney disease or end stage renal disease: Secondary | ICD-10-CM | POA: Diagnosis not present

## 2023-07-11 DIAGNOSIS — Z992 Dependence on renal dialysis: Secondary | ICD-10-CM | POA: Diagnosis not present

## 2023-07-11 DIAGNOSIS — N186 End stage renal disease: Secondary | ICD-10-CM | POA: Diagnosis not present

## 2023-07-11 DIAGNOSIS — Z7985 Long-term (current) use of injectable non-insulin antidiabetic drugs: Secondary | ICD-10-CM | POA: Diagnosis not present

## 2023-07-11 DIAGNOSIS — Z89412 Acquired absence of left great toe: Secondary | ICD-10-CM | POA: Diagnosis not present

## 2023-07-11 DIAGNOSIS — E782 Mixed hyperlipidemia: Secondary | ICD-10-CM | POA: Diagnosis not present

## 2023-07-11 NOTE — Telephone Encounter (Signed)
Prescription Request  07/11/2023  LOV: Visit date not found  What is the name of the medication or equipment? furosemide (LASIX) 80 MG tablet   Have you contacted your pharmacy to request a refill? Yes   Which pharmacy would you like this sent to?  [facility] [address], [place] - [address] [place] [address] Phone: [phone] Fax: [phone]    Patient notified that their request is being sent to the clinical staff for review and that they should receive a response within 2 business days.   Please advise at Mobile [phone] (mobile)

## 2023-07-13 ENCOUNTER — Other Ambulatory Visit: Payer: Self-pay

## 2023-07-13 ENCOUNTER — Ambulatory Visit (INDEPENDENT_AMBULATORY_CARE_PROVIDER_SITE_OTHER): Payer: No Typology Code available for payment source | Admitting: Internal Medicine

## 2023-07-13 ENCOUNTER — Encounter: Payer: Self-pay | Admitting: Internal Medicine

## 2023-07-13 VITALS — BP 174/74 | HR 90 | Temp 96.1°F | Wt 190.0 lb

## 2023-07-13 DIAGNOSIS — L089 Local infection of the skin and subcutaneous tissue, unspecified: Secondary | ICD-10-CM

## 2023-07-13 DIAGNOSIS — B351 Tinea unguium: Secondary | ICD-10-CM

## 2023-07-13 DIAGNOSIS — E11628 Type 2 diabetes mellitus with other skin complications: Secondary | ICD-10-CM

## 2023-07-13 NOTE — Patient Instructions (Signed)
Your infection has been treated several months ago  Continue nails care with podiatry  I agree you will need diabetic shoes   F/u podiatry  No need for any further follow up with our id clinic

## 2023-07-13 NOTE — Progress Notes (Signed)
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) 11/10/2022   Former smoker 07/12/2022   Diabetic neuropathy (HCC) 10/28/2021   Anemia in chronic kidney disease 05/27/2020   History of amputation of lesser toe of left foot (HCC) 08/18/2015   Hyperlipidemia, mixed 08/18/2015   Type 2 diabetes mellitus with peripheral neuropathy (HCC) 08/18/2015   Essential hypertension 04/17/2013    Patient's Medications  New Prescriptions   No medications on file  Previous Medications   AMLODIPINE (NORVASC) 10 MG TABLET    Take 1 tablet (10 mg total) by mouth daily in the afternoon.   AMOXICILLIN-CLAVULANATE (AUGMENTIN) 500-125 MG TABLET    Take 1 tablet by mouth daily at 6 PM.   ASPIRIN EC 81 MG TABLET    Take 1 tablet (81 mg total) by mouth daily. Take aspirin and Plavix together for 3 months, then stop aspirin and continue with Plavix indefinitely   ATORVASTATIN (LIPITOR) 80 MG TABLET    Take 1 tablet (80 mg total) by mouth daily in the afternoon.   CALCITRIOL (ROCALTROL) 0.25 MCG CAPSULE    Take 0.25 mcg by mouth daily in the afternoon.   CALCIUM ACETATE (PHOSLO) 667 MG TABLET    Take 1,334 mg by mouth 3 (three) times daily.   CARVEDILOL (COREG) 6.25 MG TABLET    Take 1 tablet (6.25 mg total) by mouth 2 (two) times daily.   CLOPIDOGREL (PLAVIX) 75 MG TABLET    Take 1 tablet (75 mg total) by mouth daily.   CONTINUOUS GLUCOSE SENSOR (FREESTYLE LIBRE 2 SENSOR) MISC    USE TO CHECK BLOOD GLUCOSE CONTINUOUSLY. CHANGE SENSOR EVERY 14 DAYS   DOCUSATE SODIUM (COLACE) 100 MG CAPSULE    Take 100 mg by mouth daily.   DULAGLUTIDE 4.5 MG/0.5ML SOPN    Inject 4.5 mg into the skin every Sunday.   FUROSEMIDE (LASIX) 80 MG TABLET    Take 80 mg by mouth daily.   NOVOLOG MIX 70/30 FLEXPEN  (70-30) 100 UNIT/ML FLEXPEN    Inject 20 Units into the skin 2 (two) times daily with a meal.   TRAMADOL (ULTRAM) 50 MG TABLET    Take 1 tablet (50 mg total) by mouth every 12 (twelve) hours as needed.  Modified Medications   No medications on file  Discontinued Medications   No medications on file    Subjective: Dent Plantz is a 72 y.o. M with PMHx as below(including A1c 8.3), CKD stage IV presents for hospital follow-up of left foot osteomyelitis. He was admitted 1/9-1/19/23 to [facility] with worsening diabetic foot wound. He started on bactrim prior to admission. Taken to OR on 1/9 for I7D and 1st metatarsal resection on 1/11 OR Cx + strep anginosis, proteus mirabilis and Prevotella bivia. Started on linezolid(avoid nephrotoxicity of vancomycin) and pip-tazo. He continued to fever. ID engaged. MRI left ankle showed abscess in hindfoot, calcaneal acute osteomyelitis, abscess in the proximal abductor hallucis. He underwent  debridement of left heel with bone Bx on 1/15. HE was transitioned to Augmentin and doxycyline to complete 6 weeks of antibiotics from OR 1/15 (EOT 11/26/21). 10/26/21: He reports slight tremors that started a couple weeks priro to admission. He denies fever, chills, N/V/D. Interval: Admitted to [facility] 1/261/27 for  hyperkalemia and AKI on CKD IV. Started on lokelma.  12/02/21 Pt reprots wound vac is off. No new complaints.  Interim: on 3/30 left heel wound debrided by podiatry. 01/06/22 MR left ankle showed acute osteo of plantar medial aspect of calcaneus.  01/11/22: Pt reports he is still taking antibiotics. Instead of taking Augmentin and doxycyline twice a day, he was taking it once a day.  Interim: Seen by podiatry(Dr. Ralene Cork on 03/15/22) and noted wound was healed. Follows /03/15/22: Reports he is doing well. Still taking antibiotics(twice a day). /  05/12/22: Antibiotics stopped at last visit(9weeks). Seen by Dr. Ralene Cork DPM on 70/12 wound healing. MRI on 7/19 showed resolution of  abscess.  Interim: Patient was hospitalized at [facility] for first toe osteomyelitis.  He had been placed on Augmentin by podiatry for couple weeks.  Admitted due to MRI findings of osteomyelitis and concern for need for IV antibiotics.  On admission patient was afebrile without leukocytosis.  MRI showed soft tissue ulceration along the dorsal medial aspect great toe associated osteomyelitis distal first phalanx, medial head of the first proximal phalanx.  He was initially started on broad-spectrum antibiotics with bank mycin and cefepime in the.  ID engaged.  Ideally would have like to obtain bone biopsies off of antibiotics.  Bone biopsy obtained on 70/2018 grew MRSA, osteo seen on pathology.  Patient on doxycycline and Augmentin.  08/31/2022: Patient reports he is tolerating Doxy Augmentin.  Denies any missed doses.  Denies fevers and chills. 10/05/2022 patient is doing well.  He completed his antibiotics.  Today 01/10/23: Doing well no new complaints  07/13/23 id clinic f/u See A&P for detail     Review of Systems: Review of Systems  All other systems reviewed and are negative.   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)    Diabetic neuropathy (HCC)    Diabetic ulcer of foot associated with diabetes mellitus due to underlying condition, limited to breakdown of skin (HCC)    left   GERD (gastroesophageal reflux disease)    Hyperlipidemia    Hypertension    Non compliance w medication regimen    Osteomyelitis (HCC) 08/2022   great toe of right foot   Osteomyelitis of left foot (HCC)    Secondary hyperparathyroidism of renal origin [facility])    per nephrology lov dr Thedore Mins 05-22-2022   Stroke [facility]) 2016   no deficits    Social History   Tobacco Use   Smoking status: Former   Smokeless tobacco: Never  Vaping Use   Vaping status: Never Used  Substance Use Topics    Alcohol use: Not Currently   Drug use: Never    No family history on file.  Allergies  Allergen Reactions   Losartan Potassium Other (See Comments)    hyperkalemia   Sildenafil Other (See Comments)    Not effective. "Did not like the way it made me feel"    Health Maintenance  Topic Date Due   Medicare Annual Wellness (AWV)  Never done   OPHTHALMOLOGY EXAM  Never done   Zoster Vaccines- Shingrix (1 of 2) 04/19/2003   COVID-19 Vaccine (5 - 2023-24 season) 06/03/2023   FOOT EXAM  07/12/2023   INFLUENZA VACCINE  12/31/2023 (Originally 05/03/2023)   HEMOGLOBIN A1C  11/30/2023   Colonoscopy  08/10/2025   DTaP/Tdap/Td (2 - Td or Tdap) 08/17/2025  Pneumonia Vaccine 84+ Years old  Completed   Hepatitis C Screening  Completed   HPV VACCINES  Aged Out    Objective:  Vitals:   07/13/23 1007  Weight: 190 lb (86.2 kg)   Body mass index is 25.77 kg/m.  Physical Exam Constitutional:      General: He is not in acute distress.    Appearance: He is normal weight. He is not toxic-appearing.  HENT:     Head: Normocephalic and atraumatic.     Right Ear: External ear normal.     Left Ear: External ear normal.     Nose: No congestion or rhinorrhea.     Mouth/Throat:     Mouth: Mucous membranes are moist.     Pharynx: Oropharynx is clear.  Eyes:     Extraocular Movements: Extraocular movements intact.     Conjunctiva/sclera: Conjunctivae normal.     Pupils: Pupils are equal, round, and reactive to light.  Cardiovascular:     Rate and Rhythm: Normal rate and regular rhythm.     Heart sounds: No murmur heard.    No friction rub. No gallop.  Pulmonary:     Effort: Pulmonary effort is normal.     Breath sounds: Normal breath sounds.  Abdominal:     General: Abdomen is flat. Bowel sounds are normal.     Palpations: Abdomen is soft.  Musculoskeletal:        General: No swelling. Normal range of motion.     Cervical back: Normal range of motion and neck supple.  Skin:     General: Skin is warm and dry.  Neurological:     General: No focal deficit present.     Mental Status: He is oriented to person, place, and time.  Psychiatric:        Mood and Affect: Mood normal.    Right first big toe nail bed with nails gone; no tenderness/fluctuance/ulcer there. Onychomycotic looking 2nd toe nail and long 3rd toe nail   Lab Results Lab Results  Component Value Date   WBC 9.9 06/02/2023   HGB 10.2 (L) 06/02/2023   HCT 30.6 (L) 06/02/2023   MCV 89.0 06/02/2023   PLT 169 06/02/2023    Lab Results  Component Value Date   CREATININE 6.12 (H) 06/02/2023   BUN 47 (H) 06/02/2023   NA 129 (L) 06/02/2023   K 3.7 06/02/2023   CL 92 (L) 06/02/2023   CO2 25 06/02/2023    Lab Results  Component Value Date   ALT 18 06/01/2023   AST 20 06/01/2023   ALKPHOS 63 06/01/2023   BILITOT 0.6 06/01/2023    Lab Results  Component Value Date   CHOL 95 05/31/2023   HDL 21 (L) 05/31/2023   LDLCALC 11 05/31/2023   TRIG 315 (H) 05/31/2023   CHOLHDL 4.5 05/31/2023   No results found for: "LABRPR", "RPRTITER" No results found for: "HIV1RNAQUANT", "HIV1RNAVL", "CD4TABS"  Assessment/Plan  72 year old male here for f/u of right first toe osteomyelitis   #Right first toe osteomyelitis with 11/18 bone biopsy cultures positive rare CoNS. #Concern for nonhealing ulcer #Diabetes mellitus(A1c 7.7) #Hx of left foot osteomyelitis treated with prolonged antibioitcs -Again no obvious signs of skin soft tissue infection.  Although there is some areas on his toes that are callused over.  He is tolerating doxycycline and Augmentin.  His bone biopsy did not show osteomyelitis blood cultures grew rare MR CONS(likely contaminant).  Completed 6 weeks of doxycycline and Augmentin EOT 12/28.  -Seen  by podiatry on 10/05/22 and woudn debrided and overall smaller and improved.   -Followed by wound care, wound improving, on HBO.  -ESR, CRP stable at last visit on 10/05/22 off of abx Antibiotics:   Doxycycline and Augmentin 11/16 - 12/28 Plan Follow-up in 6 months, ulcer is still present but improving.   --------------- 07/13/23 id clinic assessment Reviewed charts Last seen dr Thedore Mins 01/2023. Doing well at that time off abx Continues to do well off abx >6 months no active ulcer/pain/erythema of the first right toe No sign of infection   #dm neuropathy -it appears he might need new diabetic shoes as the old ones worn out -he can follow up with podiatry for shoes fitting and also nails care    #ESRD on HD-recently started  #COVID booster  Raymondo Band, MD [facility] 07/13/2023, 10:10 AM

## 2023-07-13 NOTE — Telephone Encounter (Signed)
Spoke with the patient and with [facility] informed pt per drs notes, [facility] this from Dr Thedore Mins, [facility]

## 2023-07-17 DIAGNOSIS — E1122 Type 2 diabetes mellitus with diabetic chronic kidney disease: Secondary | ICD-10-CM | POA: Diagnosis not present

## 2023-07-17 DIAGNOSIS — N186 End stage renal disease: Secondary | ICD-10-CM | POA: Diagnosis not present

## 2023-07-17 DIAGNOSIS — Z992 Dependence on renal dialysis: Secondary | ICD-10-CM | POA: Diagnosis not present

## 2023-07-18 DIAGNOSIS — E1122 Type 2 diabetes mellitus with diabetic chronic kidney disease: Secondary | ICD-10-CM | POA: Diagnosis not present

## 2023-07-18 DIAGNOSIS — I69312 Visuospatial deficit and spatial neglect following cerebral infarction: Secondary | ICD-10-CM | POA: Diagnosis not present

## 2023-07-18 DIAGNOSIS — E782 Mixed hyperlipidemia: Secondary | ICD-10-CM | POA: Diagnosis not present

## 2023-07-18 DIAGNOSIS — N186 End stage renal disease: Secondary | ICD-10-CM | POA: Diagnosis not present

## 2023-07-18 DIAGNOSIS — Z992 Dependence on renal dialysis: Secondary | ICD-10-CM | POA: Diagnosis not present

## 2023-07-18 DIAGNOSIS — E1140 Type 2 diabetes mellitus with diabetic neuropathy, unspecified: Secondary | ICD-10-CM | POA: Diagnosis not present

## 2023-07-18 DIAGNOSIS — Z7985 Long-term (current) use of injectable non-insulin antidiabetic drugs: Secondary | ICD-10-CM | POA: Diagnosis not present

## 2023-07-18 DIAGNOSIS — I120 Hypertensive chronic kidney disease with stage 5 chronic kidney disease or end stage renal disease: Secondary | ICD-10-CM | POA: Diagnosis not present

## 2023-07-18 DIAGNOSIS — Z89412 Acquired absence of left great toe: Secondary | ICD-10-CM | POA: Diagnosis not present

## 2023-07-18 DIAGNOSIS — Z794 Long term (current) use of insulin: Secondary | ICD-10-CM | POA: Diagnosis not present

## 2023-07-24 DIAGNOSIS — N186 End stage renal disease: Secondary | ICD-10-CM | POA: Diagnosis not present

## 2023-07-24 DIAGNOSIS — Z992 Dependence on renal dialysis: Secondary | ICD-10-CM | POA: Diagnosis not present

## 2023-07-25 ENCOUNTER — Ambulatory Visit (INDEPENDENT_AMBULATORY_CARE_PROVIDER_SITE_OTHER): Payer: No Typology Code available for payment source | Admitting: Podiatry

## 2023-07-25 ENCOUNTER — Ambulatory Visit: Payer: No Typology Code available for payment source | Admitting: Podiatry

## 2023-07-25 ENCOUNTER — Encounter: Payer: Self-pay | Admitting: Podiatry

## 2023-07-25 DIAGNOSIS — E1142 Type 2 diabetes mellitus with diabetic polyneuropathy: Secondary | ICD-10-CM

## 2023-07-25 DIAGNOSIS — B351 Tinea unguium: Secondary | ICD-10-CM

## 2023-07-25 DIAGNOSIS — M79675 Pain in left toe(s): Secondary | ICD-10-CM

## 2023-07-25 DIAGNOSIS — S98132A Complete traumatic amputation of one left lesser toe, initial encounter: Secondary | ICD-10-CM | POA: Diagnosis not present

## 2023-07-25 DIAGNOSIS — M79674 Pain in right toe(s): Secondary | ICD-10-CM

## 2023-07-25 NOTE — Progress Notes (Signed)
Subjective:  Patient ID: [name], male    DOB: 1951-01-12,  MRN: [id number]  Chief Complaint  Patient presents with   Diabetes    PATIENT STATES HE NEEDS SOME NEW SHOES AND TO GET HIS TOE NAILS CUT. PATIENT FAMILY MEMBER STATES THAT HIS TOE NAIL ON HIS RIGHT FOOT HALLUX CAME OFF. PATIENT STATES HE IS NOT IN ANY PAIN. PATIENT NEEDS REFILL ON SOME MEDICATION BECAUSE HE IS OUT ,    72 y.o. male returns for the above complaint.  Patient presents with thickened onychodystrophy mycotic toenails x 9.  Patient has a history of great toe amputation to the left side.  She he wanted to get it taken care of.  He would like to have the nail debride down his I will do it himself.  He also states that he is a diabetic she has been a lot since he got  [employer] shoes.  Objective:  There were no vitals filed for this visit. Podiatric Exam: Vascular: dorsalis pedis and posterior tibial pulses are palpable bilateral. Capillary return is immediate. Temperature gradient is WNL. Skin turgor WNL  Sensorium: Normal Semmes Weinstein monofilament test. Normal tactile sensation bilaterally. Nail Exam: Pt has thick disfigured discolored nails with subungual debris noted bilateral entire nail hallux through fifth toenails.  Pain on palpation to the nails. Ulcer Exam: There is no evidence of ulcer or pre-ulcerative changes or infection. Orthopedic Exam: Muscle tone and strength are WNL. No limitations in general ROM. No crepitus or effusions noted.  Skin: No Porokeratosis. No infection or ulcers    Assessment & Plan:   1. Type 2 diabetes mellitus with peripheral neuropathy (HCC)   2. Amputated toe of left foot (HCC)   3. Pain due to onychomycosis of toenails of both feet     Patient was evaluated and treated and all questions answered.  History of toe amputation left great toe -Given that patient is a diabetic with a history of amputation patient will benefit from diabetes/diabetic shoes.  I discussed with  patient he will be scheduled to see triage for diabetic shoes  Onychomycosis with pain  -Nails palliatively debrided as below. -Educated on self-care  Procedure: Nail Debridement Rationale: pain  Type of Debridement: manual, sharp debridement. Instrumentation: Nail nipper, rotary burr. Number of Nails: 10  Procedures and Treatment: Consent by patient was obtained for treatment procedures. The patient understood the discussion of treatment and procedures well. All questions were answered thoroughly reviewed. Debridement of mycotic and hypertrophic toenails, 1 through 5 bilateral and clearing of subungual debris. No ulceration, no infection noted.  Return Visit-Office Procedure: Patient instructed to return to the office for a follow up visit 3 months for continued evaluation and treatment.  Nicholes Rough, DPM    No follow-ups on file.

## 2023-08-02 DIAGNOSIS — N186 End stage renal disease: Secondary | ICD-10-CM | POA: Diagnosis not present

## 2023-08-02 DIAGNOSIS — Z992 Dependence on renal dialysis: Secondary | ICD-10-CM | POA: Diagnosis not present

## 2023-08-04 DIAGNOSIS — D509 Iron deficiency anemia, unspecified: Secondary | ICD-10-CM | POA: Diagnosis not present

## 2023-08-04 DIAGNOSIS — D631 Anemia in chronic kidney disease: Secondary | ICD-10-CM | POA: Diagnosis not present

## 2023-08-04 DIAGNOSIS — Z992 Dependence on renal dialysis: Secondary | ICD-10-CM | POA: Diagnosis not present

## 2023-08-04 DIAGNOSIS — N186 End stage renal disease: Secondary | ICD-10-CM | POA: Diagnosis not present

## 2023-08-14 DIAGNOSIS — Z992 Dependence on renal dialysis: Secondary | ICD-10-CM | POA: Diagnosis not present

## 2023-08-14 DIAGNOSIS — N186 End stage renal disease: Secondary | ICD-10-CM | POA: Diagnosis not present

## 2023-08-17 ENCOUNTER — Other Ambulatory Visit: Payer: No Typology Code available for payment source

## 2023-08-21 ENCOUNTER — Telehealth (INDEPENDENT_AMBULATORY_CARE_PROVIDER_SITE_OTHER): Payer: Self-pay

## 2023-08-21 NOTE — Telephone Encounter (Signed)
A fax was received from [place] at [facility] to have the patient scheduled for a fistulagram. Patient is scheduled with Dr. Wyn Quaker on 08/27/23 with a 8:00 am arrival time to the [facility]. Pre-procedure instructions will be faxed to attention Betsey at [facility].

## 2023-08-22 ENCOUNTER — Encounter: Payer: Self-pay | Admitting: Neurology

## 2023-08-22 ENCOUNTER — Ambulatory Visit (INDEPENDENT_AMBULATORY_CARE_PROVIDER_SITE_OTHER): Payer: No Typology Code available for payment source | Admitting: Neurology

## 2023-08-22 ENCOUNTER — Other Ambulatory Visit: Payer: Self-pay | Admitting: Neurology

## 2023-08-22 VITALS — BP 136/68 | HR 91 | Ht 72.0 in | Wt 188.5 lb

## 2023-08-22 DIAGNOSIS — I6381 Other cerebral infarction due to occlusion or stenosis of small artery: Secondary | ICD-10-CM

## 2023-08-22 DIAGNOSIS — I63531 Cerebral infarction due to unspecified occlusion or stenosis of right posterior cerebral artery: Secondary | ICD-10-CM

## 2023-08-22 DIAGNOSIS — R269 Unspecified abnormalities of gait and mobility: Secondary | ICD-10-CM

## 2023-08-22 NOTE — Progress Notes (Signed)
[facility]  PATIENT: [name] DOB: 08-21-1951  REQUESTING CLINICIAN: Charlsie Quest, MD HISTORY FROM: Patient/Spouse/Chart review  REASON FOR VISIT: Stroke follow up   HISTORICAL  CHIEF COMPLAINT:  Chief Complaint  Patient presents with   New Patient (Initial Visit)    Rm 13. Patient with Wife. Reports right leg bothering him since stroke, but states left eye sight is messed up. Used cane prior to stroke. Reports neuropathy since 2016, this is second stroke according to wife.     HISTORY OF PRESENT ILLNESS:  Discussed the use of AI scribe software for clinical note transcription with the patient, who gave verbal consent to proceed.  History of Present Illness   The patient is a 72 year old man with a history of end stage renal disease on dialysis, hypertension, hyperlipidemia, diabetes who is presenting following a stroke in August. He recently suffered a significant stroke, which has resulted in visual deficits and memory lapses. The stroke affected the posterior part of the brain, impacting the right occipital region and causing vision loss on the left side. The patient has to move his head to see on the left. Another stroke in the thalamus was noted, which could have caused left-sided deficits, but the patient has not reported any numbness or weakness. SInce leaving the hospital, he has been on DAPT for 3 months and currently on Plavix alone. He has not completed his cardiac monitor.   The patient also reports pain in the right side of his leg, which he describes as a "jerk" feeling in the hip. He denies any recent falls. The patient's memory lapses have been significant, with frequent repetition and forgetfulness per spouse  The patient has diabetes and high cholesterol, and is on medication for hypertension. He was prescribed aspirin and Plavix following the stroke, but has since stopped the aspirin and is only taking Plavix. The patient sleeps well and  denies any issues with sleep.       Hospital Course and Summary  72 yr old male with history of ESRD on hemodialysis TTS, diabetes mellitus type 2, hyperlipidemia, GERD, history of stroke, hypertension came to ED with chief complaints of headache.  Also had change in vision.  Patient went to hemodialysis and after that developed headache with change in vision.  In the ED CT head showed acute infarct in the right occipital lobe. Neurology was consulted Acute infarct in the right occipital lobe -Likely embolic infarct, MRI brain showed acute ischemic stroke in the right PCA territory -Neurology consulted, recommend aspirin and Plavix for 3 months followed by Plavix 75 mg daily -Continue atorvastatin 80 mg daily -Continue permissive hypertension for 48 hours -MRI brain showed occlusion of right PCA in the P2 segment -Echocardiogram did not show intracardiac thrombus -Will need event monitor set up as outpatient, called cardiology, they will call and set up event monitor as outpatient.  OTHER MEDICAL CONDITIONS: ESRD on dialysis, Hypertension, Hyperlipidemia, DMII   REVIEW OF SYSTEMS: Full 14 system review of systems performed and negative with exception of: As noted in the HPI  ALLERGIES: Allergies  Allergen Reactions   Losartan Potassium Other (See Comments)    hyperkalemia   Sildenafil Other (See Comments)    Not effective. "Did not like the way it made me feel"    HOME MEDICATIONS: Outpatient Medications Prior to Visit  Medication Sig Dispense Refill   amLODipine (NORVASC) 10 MG tablet Take 1 tablet (10 mg total) by mouth daily in the afternoon. 90  tablet 3   atorvastatin (LIPITOR) 80 MG tablet Take 1 tablet (80 mg total) by mouth daily in the afternoon. 90 tablet 3   calcitRIOL (ROCALTROL) 0.25 MCG capsule Take 0.25 mcg by mouth daily in the afternoon.     calcium acetate (PHOSLO) 667 MG tablet Take 1,334 mg by mouth 3 (three) times daily.     carvedilol (COREG) 6.25 MG tablet  Take 1 tablet (6.25 mg total) by mouth 2 (two) times daily. 180 tablet 3   clopidogrel (PLAVIX) 75 MG tablet Take 1 tablet (75 mg total) by mouth daily. 30 tablet 3   Continuous Glucose Sensor (FREESTYLE LIBRE 2 SENSOR) MISC USE TO CHECK BLOOD GLUCOSE CONTINUOUSLY. CHANGE SENSOR EVERY 14 DAYS 6 each 3   docusate sodium (COLACE) 100 MG capsule Take 100 mg by mouth daily.     Dulaglutide 4.5 MG/0.5ML SOPN Inject 4.5 mg into the skin every Sunday. 6 mL 4   furosemide (LASIX) 80 MG tablet Take 80 mg by mouth daily.     NOVOLOG MIX 70/30 FLEXPEN (70-30) 100 UNIT/ML FlexPen Inject 20 Units into the skin 2 (two) times daily with a meal. (Patient taking differently: Inject 30 Units into the skin 2 (two) times daily with a meal.) 30 mL 3   amoxicillin-clavulanate (AUGMENTIN) 500-125 MG tablet Take 1 tablet by mouth daily at 6 PM. 3 tablet 0   aspirin EC 81 MG tablet Take 1 tablet (81 mg total) by mouth daily. Take aspirin and Plavix together for 3 months, then stop aspirin and continue with Plavix indefinitely 90 tablet 0   traMADol (ULTRAM) 50 MG tablet Take 1 tablet (50 mg total) by mouth every 12 (twelve) hours as needed. 10 tablet 0   No facility-administered medications prior to visit.    PAST MEDICAL HISTORY: 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)    Diabetic neuropathy (HCC)    Diabetic ulcer of foot associated with diabetes mellitus due to underlying condition, limited to breakdown of skin (HCC)    left   GERD (gastroesophageal reflux disease)    Hyperlipidemia    Hypertension    Non compliance w medication regimen    Osteomyelitis (HCC) 08/2022   great toe of right foot   Osteomyelitis of left foot (HCC)    Secondary hyperparathyroidism of renal origin [facility])    per nephrology lov dr Thedore Mins 05-22-2022   Stroke (HCC) 2016   no deficits    PAST SURGICAL  HISTORY: Past Surgical History:  Procedure Laterality Date   AMPUTATION OF REPLICATED TOES Left 2018   left big toe, second toe   APPENDECTOMY     AV FISTULA PLACEMENT Left 10/20/2022   Procedure: INSERTION OF ARTERIOVENOUS (AV) GORE-TEX GRAFT ARM ( BRACHIAL AXILLARY);  Surgeon: Renford Dills, MD;  Location: [facility];  Service: Vascular;  Laterality: Left;   BIOPSY  08/10/2020   Procedure: BIOPSY;  Surgeon: Dolores Frame, MD;  Location: [facility];  Service: Gastroenterology;;   CHOLECYSTECTOMY     COLON SURGERY     blockage   COLONOSCOPY WITH PROPOFOL N/A 08/10/2020   Procedure: COLONOSCOPY WITH PROPOFOL;  Surgeon: Dolores Frame, MD;  Location: [facility];  Service: Gastroenterology;  Laterality: N/A;  815   GRAFT APPLICATION Left 12/08/2019   Procedure: GRAFT APPLICATION;  Surgeon: Asencion Islam, DPM;  Location: [facility];  Service: Podiatry;  Laterality: Left;   HERNIA REPAIR     I & D EXTREMITY Left 10/10/2021   Procedure: IRRIGATION AND DEBRIDEMENT left foot;  Surgeon: Park Liter, DPM;  Location: [facility];  Service: Podiatry;  Laterality: Left;   I & D EXTREMITY Right 08/19/2022   Procedure: IRRIGATION AND DEBRIDEMENT RIGHT GREAT TOE GRAFT APPLICATION WITH BIOPSY;  Surgeon: Edwin Cap, DPM;  Location: [facility];  Service: Podiatry;  Laterality: Right;   IRRIGATION AND DEBRIDEMENT FOOT Left 10/16/2021   Procedure: IRRIGATION AND DEBRIDEMENT FOOT;  Surgeon: Asencion Islam, DPM;  Location: [facility];  Service: Podiatry;  Laterality: Left;  Abscess left heel   METATARSAL HEAD EXCISION Left 12/08/2019   Procedure: METATARSAL HEAD RECESSION THIRD LEFT;  Surgeon: Asencion Islam, DPM;  Location: [facility];  Service: Podiatry;  Laterality: Left;   POLYPECTOMY  08/10/2020   Procedure: POLYPECTOMY;  Surgeon: Dolores Frame, MD;  Location: [facility];  Service: Gastroenterology;;   WOUND DEBRIDEMENT Left  12/08/2019   Procedure: DEBRIDEMENT WOUND;  Surgeon: Asencion Islam, DPM;  Location: [facility];  Service: Podiatry;  Laterality: Left;   WOUND DEBRIDEMENT Left 10/12/2021   Procedure: LEFT FOOT WOUND DEBRIDEMENT AND IRRIGATION, 1ST METATARSAL RESECTION;  Surgeon: Park Liter, DPM;  Location: [facility];  Service: Podiatry;  Laterality: Left;    FAMILY HISTORY: History reviewed. No pertinent family history.  SOCIAL HISTORY: Social History   Socioeconomic History   Marital status: Married    Spouse name: [name]   Number of children: Not on file   Years of education: Not on file   Highest education level: Not on file  Occupational History   Not on file  Tobacco Use   Smoking status: Former   Smokeless tobacco: Never  Vaping Use   Vaping status: Never Used  Substance and Sexual Activity   Alcohol use: Not Currently   Drug use: Never   Sexual activity: Not on file  Other Topics Concern   Not on file  Social History Narrative   Not on file   Social Determinants of Health   Financial Resource Strain: Not on file  Food Insecurity: No Food Insecurity (05/31/2023)   Hunger Vital Sign    Worried About Running Out of Food in the Last Year: Never true    Ran Out of Food in the Last Year: Never true  Transportation Needs: No Transportation Needs (05/31/2023)   PRAPARE - Administrator, Civil Service (Medical): No    Lack of Transportation (Non-Medical): No  Physical Activity: Not on file  Stress: Not on file  Social Connections: Not on file  Intimate Partner Violence: Not At Risk (05/31/2023)   Humiliation, Afraid, Rape, and Kick questionnaire    Fear of Current or Ex-Partner: No    Emotionally Abused: No    Physically Abused: No    Sexually Abused: No    PHYSICAL EXAM  GENERAL EXAM/CONSTITUTIONAL: Vitals:  Vitals:   08/22/23 0813  BP: 136/68  Pulse: 91  Weight: 188 lb 8 oz (85.5 kg)  Height: 6' (1.829 m)   Body mass index is 25.57  kg/m. Wt Readings from Last 3 Encounters:  08/22/23 188 lb 8 oz (85.5 kg)  07/13/23 190 lb (86.2 kg)  06/18/23 194 lb (88 kg)   Patient is in no distress; well developed, nourished and groomed; neck is supple  MUSCULOSKELETAL: Gait, strength, tone, movements noted in Neurologic exam below  NEUROLOGIC: MENTAL  STATUS:      No data to display         awake, alert, oriented to person, place and time recent and remote memory intact normal attention and concentration language fluent, comprehension intact, naming intact fund of knowledge appropriate  CRANIAL NERVE:  2nd, 3rd, 4th, 6th - He has a left visual field cut, extraocular muscles intact, no nystagmus 5th - facial sensation symmetric 7th - facial strength symmetric 8th - hearing intact 9th - palate elevates symmetrically, uvula midline 11th - shoulder shrug symmetric 12th - tongue protrusion midline  MOTOR:  normal bulk and tone, full strength in the BUE, BLE  SENSORY:  normal and symmetric to light touch  COORDINATION:  finger-nose-finger, fine finger movements normal  GAIT/STATION:  Antalgic gait,  walks with a cane   DIAGNOSTIC DATA (LABS, IMAGING, TESTING) - I reviewed patient records, labs, notes, testing and imaging myself where available.  Lab Results  Component Value Date   WBC 9.9 06/02/2023   HGB 10.2 (L) 06/02/2023   HCT 30.6 (L) 06/02/2023   MCV 89.0 06/02/2023   PLT 169 06/02/2023      Component Value Date/Time   NA 129 (L) 06/02/2023 1747   K 3.7 06/02/2023 1747   CL 92 (L) 06/02/2023 1747   CO2 25 06/02/2023 1747   GLUCOSE 186 (H) 06/02/2023 1747   BUN 47 (H) 06/02/2023 1747   CREATININE 6.12 (H) 06/02/2023 1747   CREATININE 4.39 (H) 10/05/2022 0946   CALCIUM 7.8 (L) 06/02/2023 1747   PROT 7.3 06/01/2023 0500   ALBUMIN 3.0 (L) 06/02/2023 1747   AST 20 06/01/2023 0500   ALT 18 06/01/2023 0500   ALKPHOS 63 06/01/2023 0500   BILITOT 0.6 06/01/2023 0500   GFRNONAA 9 (L) 06/02/2023  1747   Lab Results  Component Value Date   CHOL 95 05/31/2023   HDL 21 (L) 05/31/2023   LDLCALC 11 05/31/2023   TRIG 315 (H) 05/31/2023   CHOLHDL 4.5 05/31/2023   Lab Results  Component Value Date   HGBA1C 9.1 (H) 05/30/2023   No results found for: "VITAMINB12" No results found for: "TSH"  MRI Brain 05/31/2023 1. Confluent acute to early subacute Right PCA territory infarct. Cytotoxic edema with possible minimal petechial hemorrhage. But no malignant hemorrhagic transformation or significant intracranial mass effect. 2. See intracranial MRA reported separately. 3. 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  MRA Head and Neck 05/31/2023 1. Positive for occlusion of the Right PCA in the P2 segment, concordant with the Brain MRI today reported separately. 2. Evidence of intracranial atherosclerosis, but no other significant intracranial arterial stenosis.    ASSESSMENT AND PLAN  72 y.o. year old male with   Assessment and Plan    Right PCA territory Stroke, Etiology likely large vessel disease but cannot rule out cardioembolic Large stroke affecting the posterior brain and thalamus has resulted in left-sided visual field deficits. Memory issues are likely related to aging and chronic kidney disease, not this recent stroke, as explained. The need for a cardiac monitor to rule out atrial fibrillation, a potential cause of the stroke, was discussed, along with the importance of continuing Plavix. Medication may change based on cardiac monitor results. He will be referred to cardiology for a cardiac monitor, continue Plavix, schedule a follow-up in one year, and is advised to inform his ophthalmologist about the stroke and get a full peripheral vision test.  1. Cerebrovascular accident (CVA) due to occlusion of right posterior cerebral artery (HCC)   2. Right thalamic stroke (HCC)   3. Abnormal gait      Patient Instructions  Continue with current medication  30 days cardiac event monitor, will contact you regarding the results  Continue to follow up with PCP  Return in a year or sooner if worse   Orders Placed This Encounter  Procedures   CARDIAC EVENT MONITOR    No orders of the defined types were placed in this encounter.   Return in about 1 year (around 08/21/2024).    Windell Norfolk, MD 08/22/2023, 8:46 AM  [facility] [address] [place], [place] [address] [phone]

## 2023-08-22 NOTE — Patient Instructions (Signed)
Continue with current medication  30 days cardiac event monitor, will contact you regarding the results  Continue to follow up with PCP  Return in a year or sooner if worse

## 2023-08-23 ENCOUNTER — Ambulatory Visit: Payer: No Typology Code available for payment source

## 2023-08-23 ENCOUNTER — Encounter: Payer: Self-pay | Admitting: Neurology

## 2023-08-23 DIAGNOSIS — L84 Corns and callosities: Secondary | ICD-10-CM

## 2023-08-23 DIAGNOSIS — S98132A Complete traumatic amputation of one left lesser toe, initial encounter: Secondary | ICD-10-CM

## 2023-08-23 DIAGNOSIS — E1142 Type 2 diabetes mellitus with diabetic polyneuropathy: Secondary | ICD-10-CM

## 2023-08-26 ENCOUNTER — Encounter (HOSPITAL_BASED_OUTPATIENT_CLINIC_OR_DEPARTMENT_OTHER): Payer: No Typology Code available for payment source | Admitting: General Surgery

## 2023-08-27 ENCOUNTER — Encounter: Admission: RE | Payer: Self-pay | Source: Home / Self Care

## 2023-08-27 ENCOUNTER — Ambulatory Visit
Admission: RE | Admit: 2023-08-27 | Payer: No Typology Code available for payment source | Source: Home / Self Care | Admitting: Vascular Surgery

## 2023-08-27 DIAGNOSIS — I63531 Cerebral infarction due to unspecified occlusion or stenosis of right posterior cerebral artery: Secondary | ICD-10-CM

## 2023-08-27 DIAGNOSIS — N186 End stage renal disease: Secondary | ICD-10-CM

## 2023-08-27 DIAGNOSIS — I6381 Other cerebral infarction due to occlusion or stenosis of small artery: Secondary | ICD-10-CM

## 2023-08-27 DIAGNOSIS — R269 Unspecified abnormalities of gait and mobility: Secondary | ICD-10-CM

## 2023-08-27 SURGERY — A/V FISTULAGRAM
Anesthesia: Moderate Sedation | Laterality: Left

## 2023-08-28 ENCOUNTER — Other Ambulatory Visit: Payer: Self-pay

## 2023-08-28 ENCOUNTER — Telehealth (INDEPENDENT_AMBULATORY_CARE_PROVIDER_SITE_OTHER): Payer: Self-pay

## 2023-08-28 ENCOUNTER — Emergency Department
Admission: EM | Admit: 2023-08-28 | Discharge: 2023-08-28 | Disposition: A | Discharge status: Home / Self Care | Payer: No Typology Code available for payment source | Attending: Emergency Medicine | Admitting: Emergency Medicine

## 2023-08-28 DIAGNOSIS — Z992 Dependence on renal dialysis: Secondary | ICD-10-CM | POA: Diagnosis not present

## 2023-08-28 DIAGNOSIS — D631 Anemia in chronic kidney disease: Secondary | ICD-10-CM | POA: Diagnosis not present

## 2023-08-28 DIAGNOSIS — N186 End stage renal disease: Secondary | ICD-10-CM | POA: Insufficient documentation

## 2023-08-28 DIAGNOSIS — T82898A Other specified complication of vascular prosthetic devices, implants and grafts, initial encounter: Secondary | ICD-10-CM | POA: Diagnosis not present

## 2023-08-28 DIAGNOSIS — I120 Hypertensive chronic kidney disease with stage 5 chronic kidney disease or end stage renal disease: Secondary | ICD-10-CM | POA: Diagnosis not present

## 2023-08-28 DIAGNOSIS — I10 Essential (primary) hypertension: Secondary | ICD-10-CM | POA: Diagnosis not present

## 2023-08-28 DIAGNOSIS — T829XXA Unspecified complication of cardiac and vascular prosthetic device, implant and graft, initial encounter: Secondary | ICD-10-CM | POA: Diagnosis not present

## 2023-08-28 LAB — COMPREHENSIVE METABOLIC PANEL
ALT: 13 U/L (ref 0–44)
AST: 19 U/L (ref 15–41)
Albumin: 3.1 g/dL — ABNORMAL LOW (ref 3.5–5.0)
Alkaline Phosphatase: 65 U/L (ref 38–126)
Anion gap: 12 (ref 5–15)
BUN: 45 mg/dL — ABNORMAL HIGH (ref 8–23)
CO2: 19 mmol/L — ABNORMAL LOW (ref 22–32)
Calcium: 8.3 mg/dL — ABNORMAL LOW (ref 8.9–10.3)
Chloride: 106 mmol/L (ref 98–111)
Creatinine, Ser: 6.72 mg/dL — ABNORMAL HIGH (ref 0.61–1.24)
GFR, Estimated: 8 mL/min — ABNORMAL LOW (ref >60)
Glucose, Bld: 247 mg/dL — ABNORMAL HIGH (ref 70–99)
Potassium: 5.1 mmol/L (ref 3.5–5.1)
Sodium: 137 mmol/L (ref 135–145)
Total Bilirubin: 1 mg/dL (ref <1.2)
Total Protein: 6.4 g/dL — ABNORMAL LOW (ref 6.5–8.1)

## 2023-08-28 LAB — CBC WITH DIFFERENTIAL/PLATELET
Abs Immature Granulocytes: 0.04 10*3/uL (ref 0.00–0.07)
Basophils Absolute: 0.1 10*3/uL (ref 0.0–0.1)
Basophils Relative: 1 %
Eosinophils Absolute: 0.3 10*3/uL (ref 0.0–0.5)
Eosinophils Relative: 3 %
HCT: 34.7 % — ABNORMAL LOW (ref 39.0–52.0)
Hemoglobin: 11.7 g/dL — ABNORMAL LOW (ref 13.0–17.0)
Immature Granulocytes: 1 %
Lymphocytes Relative: 33 %
Lymphs Abs: 2.7 10*3/uL (ref 0.7–4.0)
MCH: 30.8 pg (ref 26.0–34.0)
MCHC: 33.7 g/dL (ref 30.0–36.0)
MCV: 91.3 fL (ref 80.0–100.0)
Monocytes Absolute: 0.7 10*3/uL (ref 0.1–1.0)
Monocytes Relative: 8 %
Neutro Abs: 4.4 10*3/uL (ref 1.7–7.7)
Neutrophils Relative %: 54 %
Platelets: 208 10*3/uL (ref 150–400)
RBC: 3.8 MIL/uL — ABNORMAL LOW (ref 4.22–5.81)
RDW: 13.2 % (ref 11.5–15.5)
WBC: 8.1 10*3/uL (ref 4.0–10.5)
nRBC: 0 % (ref 0.0–0.2)

## 2023-08-28 LAB — HEPATITIS B SURFACE ANTIGEN: Hepatitis B Surface Ag: NONREACTIVE

## 2023-08-28 SURGERY — TEMPORARY DIALYSIS CATHETER
Anesthesia: LOCAL

## 2023-08-28 MED ORDER — CHLORHEXIDINE GLUCONATE CLOTH 2 % EX PADS: 6.0000 | MEDICATED_PAD | Freq: Every day | CUTANEOUS | Status: DC

## 2023-08-28 NOTE — Telephone Encounter (Addendum)
Annice Pih called on behalf of her husband. Stating that he is no longer able to get dialysis at [facility] because of his clogged fistula. He missed dialysis Saturday and told due the clogged fistula. Looking at the chart with the wife on the phone, she canceled his fistula procedure for yesterday 08/27/23 due to having to work. She stated that [facility] told her Saturday that he needs to get it done but, she canceled procedure appt anyway.   [facility] to see if he can be seen.  [name] and his wife were instructed to go to the ED to get treated so he can continue dialysis.

## 2023-08-28 NOTE — ED Triage Notes (Signed)
Pt to ED for clogged access to left arm for dialysis. Last used thursday

## 2023-08-28 NOTE — Consult Note (Signed)
Hospital Consult    Reason for Consult:  A/V Fistula Thrombosed  Requesting Physician:  Bridgett Larsson [name] MRN #:  [id number]  History of Present Illness: This is a 72 y.o. male with a history of HTN, ESRD on dialysis, presents to the ED for evaluation management of a clogged dialysis fistula.  Patient allegedly extrema.  Patient reportedly last had dialysis on Thursday of last week at [facility].  He was unable to dialyze on Saturday due to a clogged fistula.  He also apparently had a outpatient surgical procedure scheduled for yesterday for fistulogram, which was canceled according to chart review, by the patient's wife due to her work schedule.  Patient presents to the ED today after attempting to be seen as a walk-in patient at the clinic today.  No other complaints at this time.  Vascular surgery consulted to evaluate.  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)    Diabetic neuropathy (HCC)    Diabetic ulcer of foot associated with diabetes mellitus due to underlying condition, limited to breakdown of skin (HCC)    left   GERD (gastroesophageal reflux disease)    Hyperlipidemia    Hypertension    Non compliance w medication regimen    Osteomyelitis (HCC) 08/2022   great toe of right foot   Osteomyelitis of left foot (HCC)    Secondary hyperparathyroidism of renal origin [facility])    per nephrology lov dr Thedore Mins 05-22-2022   Stroke [facility]) 2016   no deficits    Past Surgical History:  Procedure Laterality Date   AMPUTATION OF REPLICATED TOES Left 2018   left big toe, second toe   APPENDECTOMY     AV FISTULA PLACEMENT Left 10/20/2022   Procedure: INSERTION OF ARTERIOVENOUS (AV) GORE-TEX GRAFT ARM ( BRACHIAL AXILLARY);  Surgeon: Renford Dills, MD;  Location: [facility] ORS;  Service: Vascular;  Laterality: Left;   BIOPSY  08/10/2020   Procedure: BIOPSY;  Surgeon: Dolores Frame, MD;  [facility];  Service: Gastroenterology;;   CHOLECYSTECTOMY     COLON SURGERY     blockage   COLONOSCOPY WITH PROPOFOL N/A 08/10/2020   Procedure: COLONOSCOPY WITH PROPOFOL;  Surgeon: Dolores Frame, MD;  Location: [facility];  Service: Gastroenterology;  Laterality: N/A;  815   GRAFT APPLICATION Left 12/08/2019   Procedure: GRAFT APPLICATION;  Surgeon: Asencion Islam, DPM;  Location: [facility];  Service: Podiatry;  Laterality: Left;   HERNIA REPAIR     I & D EXTREMITY Left 10/10/2021   Procedure: IRRIGATION AND DEBRIDEMENT left foot;  Surgeon: Park Liter, DPM;  Location: [facility];  Service: Podiatry;  Laterality: Left;   I & D EXTREMITY Right 08/19/2022   Procedure: IRRIGATION AND DEBRIDEMENT RIGHT GREAT TOE GRAFT APPLICATION WITH BIOPSY;  Surgeon: Edwin Cap, DPM;  Location: [facility] OR;  Service: Podiatry;  Laterality: Right;   IRRIGATION AND DEBRIDEMENT FOOT Left 10/16/2021   Procedure: IRRIGATION AND DEBRIDEMENT FOOT;  Surgeon: Asencion Islam, DPM;  Location: [facility] OR;  Service: Podiatry;  Laterality: Left;  Abscess left heel   METATARSAL HEAD EXCISION Left 12/08/2019   Procedure: METATARSAL HEAD RECESSION THIRD LEFT;  Surgeon: Asencion Islam, DPM;  Location: [facility];  Service: Podiatry;  Laterality: Left;   POLYPECTOMY  08/10/2020   Procedure: POLYPECTOMY;  Surgeon: Dolores Frame, MD;  [facility];  Service: Gastroenterology;;   WOUND DEBRIDEMENT Left 12/08/2019   Procedure: DEBRIDEMENT WOUND;  Surgeon: Asencion Islam, DPM;  Location: [facility];  Service: Podiatry;  Laterality: Left;   WOUND DEBRIDEMENT Left 10/12/2021   Procedure: LEFT FOOT WOUND DEBRIDEMENT AND IRRIGATION, 1ST METATARSAL RESECTION;  Surgeon: Park Liter, DPM;  Location: [facility];  Service: Podiatry;  Laterality: Left;    Allergies  Allergen Reactions   Losartan Potassium Other (See  Comments)    hyperkalemia   Sildenafil Other (See Comments)    Not effective. "Did not like the way it made me feel"    Prior to Admission medications   Medication Sig Start Date End Date Taking? Authorizing Provider  amLODipine (NORVASC) 10 MG tablet Take 1 tablet (10 mg total) by mouth daily in the afternoon. 03/22/23   Tommie Sams, DO  atorvastatin (LIPITOR) 80 MG tablet Take 1 tablet (80 mg total) by mouth daily in the afternoon. 03/22/23   Tommie Sams, DO  calcitRIOL (ROCALTROL) 0.25 MCG capsule Take 0.25 mcg by mouth daily in the afternoon. 05/22/22   [provider]  calcium acetate (PHOSLO) 667 MG tablet Take 1,334 mg by mouth 3 (three) times daily. 06/05/23   [provider]  carvedilol (COREG) 6.25 MG tablet Take 1 tablet (6.25 mg total) by mouth 2 (two) times daily. 03/29/23   Tommie Sams, DO  clopidogrel (PLAVIX) 75 MG tablet Take 1 tablet (75 mg total) by mouth daily. 06/04/23   Meredeth Ide, MD  Continuous Glucose Sensor (FREESTYLE LIBRE 2 SENSOR) MISC USE TO CHECK BLOOD GLUCOSE CONTINUOUSLY. CHANGE SENSOR EVERY 14 DAYS 04/11/23   Tommie Sams, DO  docusate sodium (COLACE) 100 MG capsule Take 100 mg by mouth daily.    [provider]  Dulaglutide 4.5 MG/0.5ML SOPN Inject 4.5 mg into the skin every Sunday. 05/20/23   Tommie Sams, DO  furosemide (LASIX) 80 MG tablet Take 80 mg by mouth daily.    [provider]  NOVOLOG MIX 70/30 FLEXPEN (70-30) 100 UNIT/ML FlexPen Inject 20 Units into the skin 2 (two) times daily with a meal. Patient taking differently: Inject 30 Units into the skin 2 (two) times daily with a meal. 05/13/23   Tommie Sams, DO    Social History   Socioeconomic History   Marital status: Married    Spouse name: [name]   Number of children: Not on file   Years of education: Not on file   Highest education level: Not on file  Occupational History   Not on file  Tobacco Use   Smoking status: Former   Smokeless tobacco:  Never  Vaping Use   Vaping status: Never Used  Substance and Sexual Activity   Alcohol use: Not Currently   Drug use: Never   Sexual activity: Not on file  Other Topics Concern   Not on file  Social History Narrative   Not on file   Social Determinants of Health   Financial Resource Strain: Not on file  Food Insecurity: No Food Insecurity (05/31/2023)   Hunger Vital Sign    Worried About Running Out of Food in the Last Year: Never true    Ran Out of Food in the Last Year: Never true  Transportation Needs: No Transportation Needs (05/31/2023)   PRAPARE - Administrator, Civil Service (Medical): No    Lack of Transportation (Non-Medical): No  Physical Activity: Not on file  Stress: Not on file  Social Connections: Not on file  Intimate Partner Violence: Not At Risk (05/31/2023)   Humiliation, Afraid, Rape, and Kick questionnaire    Fear of Current or Ex-Partner: No    Emotionally Abused: No    Physically Abused: No    Sexually Abused: No     History reviewed. No pertinent family history.  ROS: Otherwise negative unless mentioned in HPI  Physical Examination  Vitals:   08/28/23 1027  BP: (!) 156/60  Pulse: 96  Resp: 16  Temp: 98.6 F (37 C)  SpO2: 99%   Body mass index is 25.5 kg/m.  General:  WDWN in NAD Gait: Not observed HENT: WNL, normocephalic Pulmonary: normal non-labored breathing, without Rales, rhonchi,  wheezing Cardiac: regular, without  Murmurs, rubs or gallops; without carotid bruits Abdomen: Positive bowel sounds throughout, soft, NT/ND, no masses Skin: without rashes Vascular Exam/Pulses: Unable to palpate a bruit or thrill in the A/V Fistula  Extremities: without ischemic changes, without Gangrene , without cellulitis; without open wounds;  Musculoskeletal: no muscle wasting or atrophy  Neurologic: A&O X 3;  No focal weakness or paresthesias are detected; speech is fluent/normal Psychiatric:  The pt has Normal affect. Lymph:   Unremarkable  CBC    Component Value Date/Time   WBC 9.9 06/02/2023 1747   RBC 3.44 (L) 06/02/2023 1747   HGB 10.2 (L) 06/02/2023 1747   HCT 30.6 (L) 06/02/2023 1747   PLT 169 06/02/2023 1747   MCV 89.0 06/02/2023 1747   MCH 29.7 06/02/2023 1747   MCHC 33.3 06/02/2023 1747   RDW 14.9 06/02/2023 1747   LYMPHSABS 2.2 05/30/2023 1907   MONOABS 1.1 (H) 05/30/2023 1907   EOSABS 0.2 05/30/2023 1907   BASOSABS 0.0 05/30/2023 1907    BMET    Component Value Date/Time   NA 137 08/28/2023 1150   K 5.1 08/28/2023 1150   CL 106 08/28/2023 1150   CO2 19 (L) 08/28/2023 1150   GLUCOSE 247 (H) 08/28/2023 1150   BUN 45 (H) 08/28/2023 1150   CREATININE 6.72 (H) 08/28/2023 1150   CREATININE 4.39 (H) 10/05/2022 0946   CALCIUM 8.3 (L) 08/28/2023 1150   GFRNONAA 8 (L) 08/28/2023 1150    COAGS: Lab Results  Component Value Date   INR 1.0 05/30/2023     Non-Invasive Vascular Imaging:   NONE  Statin:  Yes.   Beta Blocker:  Yes.   Aspirin:  No. ACEI:  No. ARB:  No. CCB use:  Yes Other antiplatelets/anticoagulants:  Yes.   Plavix 75 mg Daily   ASSESSMENT/PLAN: This is a 72 y.o. male who presents to [facility] emergency department for a non working possibly thrombosed A/V fistula. Patient was last dialyzed last Thursday 08/23/23.   PLAN: I spoke with Chantel breeze NP from nephrology.  Plan is to try hemodialysis access here at the hospital due to to the fact the patient has a good palpable pulse in his AV fistula today.  If the patient is unable to be dialyzed we will proceed on with placing a temporary dialysis catheter.  Vascular surgery plans on placing a temporary dialysis catheter in the patient's that he may have hemodialysis.  Once dialyzed and blood work has stabilized patient will be taken to the vascular lab for an AV fistulogram.  I discussed in detail with the patient and his wife today the placement of a temporary dialysis catheter and the need for hemodialysis.  He  verbalizes understanding and wishes to proceed.   -I  discussed the plan with Dr. Festus Barren MD and he is in agreement with the plan.   Marcie Bal [facility] 08/28/2023 12:39 PM

## 2023-08-28 NOTE — Progress Notes (Signed)
[facility]  ROUNDING NOTE   Subjective:   [name] is a 72 year old male with past medical conditions including type 2 diabetes, GERD, hypertension, hyperlipidemia, stroke, and end-stage renal disease on hemodialysis.  Patient presents to the emergency room with a suspected clotted dialysis access.  Patient is known to our practice and receives outpatient dialysis treatments at [facility] on a TTS schedule.  Last treatment completed on Thursday.  Patient states they were unable to access his TO provide treatment on Saturday.  Patient states he was scheduled to see vascular surgery yesterday but missed that appointment.  Patient was told to come to emergency department for further evaluation.  Patient is seen sitting up on side of bed, family at bedside.  No distress noted.  Patient remains on room air with no edema.  Serologic labs within desired range.  We have been consulted to assess dialysis access and attempt dialysis treatment.   Objective:  Vital signs in last 24 hours:  Temp:  [97.9 F (36.6 C)-98.6 F (37 C)] 97.9 F (36.6 C) (11/26 1500) Pulse Rate:  [70-96] 71 (11/26 1515) Resp:  [14-16] 14 (11/26 1515) BP: (156-172)/(60-73) 164/63 (11/26 1515) SpO2:  [99 %-100 %] 100 % (11/26 1515) Weight:  [78.6 kg-85.3 kg] 78.6 kg (11/26 1500)  Weight change:  Filed Weights   08/28/23 1028 08/28/23 1500  Weight: 85.3 kg 78.6 kg    Intake/Output: No intake/output data recorded.   Intake/Output this shift:  No intake/output data recorded.  Physical Exam: General: NAD  Head: Normocephalic, atraumatic. Moist oral mucosal membranes  Eyes: Anicteric  Lungs:  Clear to auscultation, normal effort  Heart: Regular rate and rhythm  Abdomen:  Soft, nontender nondistended  Extremities: No peripheral edema.  Neurologic: Nonfocal, moving all four extremities  Skin: No lesions  Access: Left aVF    Basic Metabolic Panel: Recent Labs  Lab 08/28/23 1150  NA  137  K 5.1  CL 106  CO2 19*  GLUCOSE 247*  BUN 45*  CREATININE 6.72*  CALCIUM 8.3*    Liver Function Tests: Recent Labs  Lab 08/28/23 1150  AST 19  ALT 13  ALKPHOS 65  BILITOT 1.0  PROT 6.4*  ALBUMIN 3.1*   No results for input(s): "LIPASE", "AMYLASE" in the last 168 hours. No results for input(s): "AMMONIA" in the last 168 hours.  CBC: Recent Labs  Lab 08/28/23 1521  WBC 8.1  NEUTROABS 4.4  HGB 11.7*  HCT 34.7*  MCV 91.3  PLT 208    Cardiac Enzymes: No results for input(s): "CKTOTAL", "CKMB", "CKMBINDEX", "TROPONINI" in the last 168 hours.  BNP: Invalid input(s): "POCBNP"  CBG: No results for input(s): "GLUCAP" in the last 168 hours.  Microbiology: Results for orders placed or performed during the hospital encounter of 05/30/23  MRSA Next Gen by PCR, Nasal     Status: None   Collection Time: 05/31/23  2:50 PM   Specimen: Nasal Mucosa; Nasal Swab  Result Value Ref Range Status   MRSA by PCR Next Gen NOT DETECTED NOT DETECTED Final    Comment: (NOTE) The GeneXpert MRSA Assay (FDA approved for NASAL specimens only), is one component of a comprehensive MRSA colonization surveillance program. It is not intended to diagnose MRSA infection nor to guide or monitor treatment for MRSA infections. Test performance is not FDA approved in patients less than 53 years old. Performed at [facility], [address] [place], [place] [address]   Culture, blood (Routine X 2) w Reflex to  ID Panel     Status: None   Collection Time: 06/01/23  9:11 AM   Specimen: BLOOD  Result Value Ref Range Status   Specimen Description BLOOD BLOOD RIGHT HAND  Final   Special Requests   Final    BOTTLES DRAWN AEROBIC AND ANAEROBIC Blood Culture adequate volume   Culture   Final    NO GROWTH 5 DAYS Performed at [facility], [address] [place], [place] [address]    Report Status 06/06/2023 FINAL  Final  Culture, blood (Routine X 2) w Reflex to ID Panel     Status: None    Collection Time: 06/01/23  9:16 AM   Specimen: BLOOD  Result Value Ref Range Status   Specimen Description BLOOD BLOOD RIGHT ARM  Final   Special Requests   Final    BOTTLES DRAWN AEROBIC AND ANAEROBIC Blood Culture results may not be optimal due to an excessive volume of blood received in culture bottles   Culture   Final    NO GROWTH 5 DAYS Performed at [facility], [address] [place], [place] [address]    Report Status 06/06/2023 FINAL  Final  Urine Culture (for pregnant, neutropenic or urologic patients or patients with an indwelling urinary catheter)     Status: None   Collection Time: 06/01/23  2:20 PM   Specimen: Urine, Clean Catch  Result Value Ref Range Status   Specimen Description   Final    URINE, CLEAN CATCH Performed at [facility], [address], [place], [place] [address]    Special Requests   Final    NONE Performed at [facility], [address], [place], [place] [address]    Culture   Final    NO GROWTH Performed at [facility], [address] [place], [place] [address]    Report Status 06/02/2023 FINAL  Final    Coagulation Studies: No results for input(s): "LABPROT", "INR" in the last 72 hours.  Urinalysis: No results for input(s): "COLORURINE", "LABSPEC", "PHURINE", "GLUCOSEU", "HGBUR", "BILIRUBINUR", "KETONESUR", "PROTEINUR", "UROBILINOGEN", "NITRITE", "LEUKOCYTESUR" in the last 72 hours.  Invalid input(s): "APPERANCEUR"    Imaging: No results found.   Medications:     [START ON 08/29/2023] Chlorhexidine Gluconate Cloth  6 each Topical Q0600     Assessment/ Plan:  [name] is a 72 y.o.  male with past medical conditions including type 2 diabetes, GERD, hypertension, hyperlipidemia, stroke, and end-stage renal disease on hemodialysis.  Patient presents to the emergency room with a suspected clotted dialysis access.  CC [facility]/TTS/left aVF  Malfunctioning dialysis access, suspected.  Unable to access in  outpatient clinic.  Vascular surgery consulted, bruit and thrill present.  Requested attempted dialysis treatment before considering temp cath.  If use of access successful, vascular will consider outpatient fistulogram next week.  2.  End-stage renal disease on hemodialysis.  Last treatment completed on Thursday.  Order placed for dialysis treatment.  HD staff able to access fistula without concern and patient currently receiving dialysis at decreased [facility].  3. Anemia of chronic kidney  Lab Results  Component Value Date   HGB 11.7 (L) 08/28/2023    Hemoglobin within optimal range.  No need for ESA's at this time.  4.  Hypertension with chronic kidney disease.  Home regimen includes amlodipine, carvedilol, and furosemide.   LOS: 0 Brytnee Bechler 11/26/20244:03 PM

## 2023-08-28 NOTE — ED Notes (Signed)
See triage note  Presents d/t access being clotted off   States he had a treatment on Thursday  but was not able to have a treatment on Saturday  Was to have this taken care of yesterday  But missed appt

## 2023-08-28 NOTE — ED Provider Notes (Signed)
[facility] Emergency Department Provider Note     Event Date/Time   First MD Initiated Contact with Patient 08/28/23 1053     (approximate)   History   Vascular Access Problem   HPI  [name] is a 72 y.o. male with a history of HTN, ESRD on dialysis, presents to the ED for evaluation management of a clogged dialysis fistula.  Patient allegedly extrema.  Patient reportedly last had dialysis on Thursday of last week at [facility].  He was unable to dialyze on Saturday due to a clogged fistula.  He also apparently had a outpatient surgical procedure scheduled for yesterday for fistulogram, which was canceled according to chart review, by the patient's wife due to her work schedule.  Patient presents to the ED today after attempting to be seen as a walk-in patient at the clinic today.  No other complaints at this time.  Physical Exam   Triage Vital Signs: ED Triage Vitals  Encounter Vitals Group     BP 08/28/23 1027 (!) 156/60     Systolic BP Percentile --      Diastolic BP Percentile --      Pulse Rate 08/28/23 1027 96     Resp 08/28/23 1027 16     Temp 08/28/23 1027 98.6 F (37 C)     Temp src --      SpO2 08/28/23 1027 99 %     Weight 08/28/23 1028 188 lb (85.3 kg)     Height 08/28/23 1028 6' (1.829 m)     Head Circumference --      Peak Flow --      Pain Score 08/28/23 1028 0     Pain Loc --      Pain Education --      Exclude from Growth Chart --     Most recent vital signs: Vitals:   08/28/23 1810 08/28/23 1825  BP: (!) 175/59 (!) 178/81  Pulse: 91 96  Resp: 17 (!) 21  Temp:  98.3 F (36.8 C)  SpO2: 96% 97%    General Awake, no distress. NAD HEENT NCAT. PERRL. EOMI. No rhinorrhea. Mucous membranes are moist.  CV:  Good peripheral perfusion. Palpable thrill to LUE AV fistula RESP:  Normal effort. CTA ABD:  No distention.    ED Results / Procedures / Treatments   Labs (all labs ordered are listed, but only abnormal results  are displayed) Labs Reviewed  COMPREHENSIVE METABOLIC PANEL - Abnormal; Notable for the following components:      Result Value   CO2 19 (*)    Glucose, Bld 247 (*)    BUN 45 (*)    Creatinine, Ser 6.72 (*)    Calcium 8.3 (*)    Total Protein 6.4 (*)    Albumin 3.1 (*)    GFR, Estimated 8 (*)    All other components within normal limits  CBC WITH DIFFERENTIAL/PLATELET - Abnormal; Notable for the following components:   RBC 3.80 (*)    Hemoglobin 11.7 (*)    HCT 34.7 (*)    All other components within normal limits  CBC WITH DIFFERENTIAL/PLATELET  HEPATITIS B SURFACE ANTIGEN  HEPATITIS B SURFACE ANTIBODY, QUANTITATIVE     EKG   RADIOLOGY   No results found.   PROCEDURES:  Critical Care performed: No  Procedures   MEDICATIONS ORDERED IN ED: Medications  Chlorhexidine Gluconate Cloth 2 % PADS 6 each (has no administration in time range)  IMPRESSION / MDM / ASSESSMENT AND PLAN / ED COURSE  I reviewed the triage vital signs and the nursing notes.                              Differential diagnosis includes, but is not limited to, AV fistula clot, fistula dysfunction  Patient's presentation is most consistent with acute complicated illness / injury requiring diagnostic workup.  ----------------------------------------- 12:27 PM on 08/28/2023 ----------------------------------------- S/W Dr. Wyn Quaker via secure chat: He is unavailable to perform a fistulogram today.  He will notify his vascular team and evaluate the patient for temporary hemodialysis cath.  ----------------------------------------- 1:28 PM on 08/28/2023 ----------------------------------------- Patient to inpatient dialysis for urgent hemodialysis.  Apparently patient's AV fistula is patent without evidence of clot, and he is able to have dialysis performed today.  Patient's diagnosis is consistent with ESRD on hemodialysis with concern for AV fistula clot.  Patient presents to the ED in no  acute distress.  Labs do not reveal any significant hyperkalemia.  Peripheral IV is placed by the IV team for basic labs.  Patient is able to receive hemodialysis in the ED via his original AV fistula without complication.  Patient returns to the ED after 2 hours and 14 minutes of hemodialysis.  No complaints at this time.  He will follow-up with his outpatient dialysis center as scheduled.  Patient is to follow up with his PCP as needed or otherwise directed. Patient is given ED precautions to return to the ED for any worsening or new symptoms.   FINAL CLINICAL IMPRESSION(S) / ED DIAGNOSES   Final diagnoses:  ESRD (end stage renal disease) on dialysis [facility])  Encounter for hemodialysis (HCC)     Rx / DC Orders   ED Discharge Orders     None        Note:  This document was prepared using Dragon voice recognition software and may include unintentional dictation errors.    Lissa Hoard, PA-C 08/28/23 1930    Trinna Post, MD 08/29/23 1524

## 2023-08-28 NOTE — Discharge Instructions (Addendum)
Follow-up with your primary provider and report to outpatient dialysis as scheduled.

## 2023-08-28 NOTE — Telephone Encounter (Signed)
Patient is currently being treated in the ED

## 2023-08-28 NOTE — Progress Notes (Signed)
Patient presents to the office today for diabetic shoe and insole measuring.  Patient was measured with brannock device to determine size and width for 1 pair of extra depth shoes and foam casted for 3 right inserts and Left toefiller  Documentation of medical necessity will be sent to patient's treating diabetic doctor to verify and sign.   Patient's diabetic provider: [name] Other Do  Shoes and insoles will be ordered at that time and patient will be notified for an appointment for fitting when they arrive.   Shoe size (per patient): 49m Brannock measurement: 11 Patient shoe selection- Shoe choice:   675 Shoe size ordered: 52M Using scans from last year  [name] Cped, CFo, CFm

## 2023-08-28 NOTE — Progress Notes (Signed)
Received patient in bed to unit.    Informed consent signed and in chart.    TX duration: 2:14 mins     Transported back to floor  Hand-off given to patient's nurse.   Access used:  lt arm avf Access issues: n/a  Total UF removed: 300 mls       Maple [name], RN Dialysis Unit

## 2023-08-29 ENCOUNTER — Ambulatory Visit: Payer: No Typology Code available for payment source | Attending: Internal Medicine | Admitting: Internal Medicine

## 2023-08-29 ENCOUNTER — Encounter: Payer: Self-pay | Admitting: Internal Medicine

## 2023-08-29 VITALS — BP 136/72 | HR 96 | Ht 72.0 in | Wt 191.8 lb

## 2023-08-29 DIAGNOSIS — I10 Essential (primary) hypertension: Secondary | ICD-10-CM | POA: Diagnosis not present

## 2023-08-29 DIAGNOSIS — I63431 Cerebral infarction due to embolism of right posterior cerebral artery: Secondary | ICD-10-CM | POA: Diagnosis not present

## 2023-08-29 DIAGNOSIS — E782 Mixed hyperlipidemia: Secondary | ICD-10-CM | POA: Diagnosis not present

## 2023-08-29 LAB — HEPATITIS B SURFACE ANTIBODY, QUANTITATIVE: Hep B S AB Quant (Post): <3.5 m[IU]/mL — ABNORMAL LOW

## 2023-08-29 NOTE — Progress Notes (Signed)
Cardiology Office Note  Date: 08/29/2023   ID: [name], DOB 08/24/1951, MRN [id number]  PCP:  Tommie Sams, DO  Cardiologist:  None Electrophysiologist:  None   History of Present Illness: [name] is a 72 y.o. 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  He is currently wearing a 30-day event monitor.  He did not undergo TEE after his stroke was diagnosed.  Echocardiogram showed normal LVEF, indeterminate diastology, normal RV function, no valvular heart disease, bubble study negative for interatrial shunt.  No symptoms, overall doing great, his vision better compared to prior.  No angina, DOE, orthopnea, PND, dizziness, syncope, leg swelling.  Compliant with medications and has no side effects.  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)    Diabetic neuropathy (HCC)    Diabetic ulcer of foot associated with diabetes mellitus due to underlying condition, limited to breakdown of skin (HCC)    left   GERD (gastroesophageal reflux disease)    Hyperlipidemia    Hypertension    Non compliance w medication regimen    Osteomyelitis (HCC) 08/2022   great toe of right foot   Osteomyelitis of left foot (HCC)    Secondary hyperparathyroidism of renal origin [facility])    per nephrology lov dr Thedore Mins 05-22-2022   Stroke [facility]) 2016   no deficits    Past Surgical History:  Procedure Laterality Date   AMPUTATION OF REPLICATED TOES Left 2018   left big toe, second toe   APPENDECTOMY     AV FISTULA PLACEMENT Left 10/20/2022   Procedure: INSERTION OF ARTERIOVENOUS (AV) GORE-TEX GRAFT ARM ( BRACHIAL AXILLARY);  Surgeon: Renford Dills, MD;  Location: [facility] ORS;  Service: Vascular;  Laterality: Left;   BIOPSY  08/10/2020   Procedure:  BIOPSY;  Surgeon: Dolores Frame, MD;  [facility];  Service: Gastroenterology;;   CHOLECYSTECTOMY     COLON SURGERY     blockage   COLONOSCOPY WITH PROPOFOL N/A 08/10/2020   Procedure: COLONOSCOPY WITH PROPOFOL;  Surgeon: Dolores Frame, MD;  [facility]: [facility];  Service: Gastroenterology;  Laterality: N/A;  815   GRAFT APPLICATION Left 12/08/2019   Procedure: GRAFT APPLICATION;  Surgeon: Asencion Islam, DPM;  Location: [facility];  Service: Podiatry;  Laterality: Left;   HERNIA REPAIR     I & D EXTREMITY Left 10/10/2021   Procedure: IRRIGATION AND DEBRIDEMENT left foot;  Surgeon: Park Liter, DPM;  Location: [facility];  Service: Podiatry;  Laterality: Left;   I & D EXTREMITY Right 08/19/2022   Procedure: IRRIGATION AND DEBRIDEMENT RIGHT GREAT TOE GRAFT APPLICATION WITH BIOPSY;  Surgeon: Edwin Cap, DPM;  Location: [facility];  Service: Podiatry;  Laterality: Right;   IRRIGATION AND DEBRIDEMENT FOOT Left 10/16/2021   Procedure: IRRIGATION AND DEBRIDEMENT FOOT;  Surgeon: Asencion Islam, DPM;  Location: [facility];  Service: Podiatry;  Laterality: Left;  Abscess left heel   METATARSAL HEAD EXCISION Left 12/08/2019   Procedure: METATARSAL HEAD RECESSION THIRD LEFT;  Surgeon: Asencion Islam, DPM;  Location:  SURGERY CENTER;  Service: Podiatry;  Laterality: Left;   POLYPECTOMY  08/10/2020   Procedure: POLYPECTOMY;  Surgeon: Dolores Frame, MD;  [facility];  Service: Gastroenterology;;   WOUND DEBRIDEMENT Left 12/08/2019   Procedure: DEBRIDEMENT WOUND;  Surgeon: Asencion Islam, DPM;  Location: [facility];  Service: Podiatry;  Laterality: Left;   WOUND DEBRIDEMENT Left 10/12/2021   Procedure: LEFT FOOT WOUND DEBRIDEMENT AND IRRIGATION, 1ST METATARSAL RESECTION;  Surgeon: Park Liter, DPM;  Location: [facility];  Service: Podiatry;  Laterality: Left;    Current Outpatient Medications  Medication  Sig Dispense Refill   amLODipine (NORVASC) 10 MG tablet Take 1 tablet (10 mg total) by mouth daily in the afternoon. 90 tablet 3   atorvastatin (LIPITOR) 80 MG tablet Take 1 tablet (80 mg total) by mouth daily in the afternoon. 90 tablet 3   clopidogrel (PLAVIX) 75 MG tablet Take 1 tablet (75 mg total) by mouth daily. 30 tablet 3   docusate sodium (COLACE) 100 MG capsule Take 100 mg by mouth daily.     Dulaglutide 4.5 MG/0.5ML SOPN Inject 4.5 mg into the skin every Sunday. 6 mL 4   NOVOLOG MIX 70/30 FLEXPEN (70-30) 100 UNIT/ML FlexPen Inject 20 Units into the skin 2 (two) times daily with a meal. (Patient taking differently: Inject 30 Units into the skin 2 (two) times daily with a meal.) 30 mL 3   calcitRIOL (ROCALTROL) 0.25 MCG capsule Take 0.25 mcg by mouth daily in the afternoon.     calcium acetate (PHOSLO) 667 MG tablet Take 1,334 mg by mouth 3 (three) times daily.     carvedilol (COREG) 6.25 MG tablet Take 1 tablet (6.25 mg total) by mouth 2 (two) times daily. 180 tablet 3   Continuous Glucose Sensor (FREESTYLE LIBRE 2 SENSOR) MISC USE TO CHECK BLOOD GLUCOSE CONTINUOUSLY. CHANGE SENSOR EVERY 14 DAYS 6 each 3   furosemide (LASIX) 80 MG tablet Take 80 mg by mouth daily.     No current facility-administered medications for this visit.   Allergies:  Losartan potassium and Sildenafil   Social History: The patient  reports that he has quit smoking. He has never used smokeless tobacco. He reports that he does not currently use alcohol. He reports that he does not use drugs.   Family History: The patient's family history is not on file.   ROS:  Please see the history of present illness. Otherwise, complete review of systems is positive for none.  All other systems are reviewed and negative.   Physical Exam: VS:  BP 136/72 (BP Location: Left Arm, Patient Position: Sitting, Cuff Size: Normal)   Pulse 96   Ht 6' (1.829 m)   Wt 191 lb 12.8 oz (87 kg)   SpO2 99%   BMI 26.01 kg/m , BMI Body  mass index is 26.01 kg/m.  Wt Readings from Last 3 Encounters:  08/29/23 191 lb 12.8 oz (87 kg)  08/28/23 173 lb 4.5 oz (78.6 kg)  08/22/23 188 lb 8 oz (85.5 kg)    General: Patient appears comfortable at rest. HEENT: Conjunctiva and lids normal, oropharynx clear with moist mucosa. Neck: Supple, no elevated JVP or carotid bruits, no thyromegaly. Lungs: Clear to auscultation, nonlabored breathing at rest. Cardiac: Regular rate and rhythm, no S3 or significant systolic murmur, no pericardial rub. Abdomen: Soft, nontender, no hepatomegaly, bowel sounds present, no guarding or rebound. Extremities: No pitting edema, distal pulses 2+. Skin: Warm and dry. Musculoskeletal: No kyphosis. Neuropsychiatric: Alert and oriented x3, affect grossly appropriate.  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  He is currently wearing a 30-day event monitor.  He will benefit from TEE to rule out embolic etiology of CVA.  Continue cardioprotective medications, on Plavix monotherapy per neurology, atorvastatin 80 mg nightly.  Informed consent for TEE Risks and benefits of the procedure are explained to the patient and his daughter. Risks include, but not limited to, sore throat, aspiration, infection, pneumonia, esophageal rupture.  Patient comprehended these risks and agreed to proceed with the procedure.   HLD, at goal: Continue atorvastatin 80 mg  nightly, goal LDL less than 70.   HTN, controlled: Continue current antihypertensives, amlodipine 10 mg once daily.   I spent 45 minutes reviewing prior notes, imaging studies, discussed role of cardiology in the management of CVA and HTN.  Medication Adjustments/Labs and Tests Ordered: Current medicines are reviewed at length with the patient today.  Concerns regarding medicines are outlined above.    Disposition:  Follow up 1 year  Signed, Fey Coghill Verne Spurr, MD, 08/29/2023 2:18 PM    [facility] [address] [place], [place] [address]

## 2023-08-29 NOTE — Patient Instructions (Addendum)
Medication Instructions:  Your physician recommends that you continue on your current medications as directed. Please refer to the Current Medication list given to you today.  *If you need a refill on your cardiac medications before your next appointment, please call your pharmacy*   Lab Work: None If you have labs (blood work) drawn today and your tests are completely normal, you will receive your results only by: MyChart Message (if you have MyChart) OR A paper copy in the mail If you have any lab test that is abnormal or we need to change your treatment, we will call you to review the results.   Testing/Procedures: TEE- I will call you when this is scheduled and when instruction sheet is ready for pickup.    Follow-Up: At [facility], you and your health needs are our priority.  As part of our continuing mission to provide you with exceptional heart care, we have created designated Provider Care Teams.  These Care Teams include your primary Cardiologist (physician) and Advanced Practice Providers (APPs -  Physician Assistants and Nurse Practitioners) who all work together to provide you with the care you need, when you need it.  We recommend signing up for the patient portal called "MyChart".  Sign up information is provided on this After Visit Summary.  MyChart is used to connect with patients for Virtual Visits (Telemedicine).  Patients are able to view lab/test results, encounter notes, upcoming appointments, etc.  Non-urgent messages can be sent to your provider as well.   To learn more about what you can do with MyChart, go to [email or web address].    Your next appointment:   1 year(s)  Provider:   You may see Luane School, MD or one of the following Advanced Practice Providers on your designated Care Team:   [place], PA-C  Jacolyn Reedy, [name]     Other Instructions

## 2023-08-31 ENCOUNTER — Telehealth: Payer: Self-pay

## 2023-08-31 NOTE — Transitions of Care (Post Inpatient/ED Visit) (Signed)
   08/31/2023  Name: [name] MRN: [id number] DOB: 1951/02/06  Today's TOC FU Call Status: Today's TOC FU Call Status:: Unsuccessful Call (1st Attempt) Unsuccessful Call (1st Attempt) Date: 08/31/23  Attempted to reach the patient regarding the most recent Inpatient/ED visit.  Follow Up Plan: Additional outreach attempts will be made to reach the patient to complete the Transitions of Care (Post Inpatient/ED visit) call.   Signature  Kandis Fantasia, LPN [facility] Health Advisor [place] [facility] You Are. We Are. One [facility] Direct Dial [phone]

## 2023-09-01 DIAGNOSIS — N186 End stage renal disease: Secondary | ICD-10-CM | POA: Diagnosis not present

## 2023-09-01 DIAGNOSIS — Z992 Dependence on renal dialysis: Secondary | ICD-10-CM | POA: Diagnosis not present

## 2023-09-04 DIAGNOSIS — D509 Iron deficiency anemia, unspecified: Secondary | ICD-10-CM | POA: Diagnosis not present

## 2023-09-04 DIAGNOSIS — N186 End stage renal disease: Secondary | ICD-10-CM | POA: Diagnosis not present

## 2023-09-04 DIAGNOSIS — D631 Anemia in chronic kidney disease: Secondary | ICD-10-CM | POA: Diagnosis not present

## 2023-09-04 DIAGNOSIS — Z992 Dependence on renal dialysis: Secondary | ICD-10-CM | POA: Diagnosis not present

## 2023-09-05 ENCOUNTER — Ambulatory Visit (INDEPENDENT_AMBULATORY_CARE_PROVIDER_SITE_OTHER): Payer: No Typology Code available for payment source | Admitting: Family Medicine

## 2023-09-05 DIAGNOSIS — E1142 Type 2 diabetes mellitus with diabetic polyneuropathy: Secondary | ICD-10-CM

## 2023-09-05 DIAGNOSIS — I10 Essential (primary) hypertension: Secondary | ICD-10-CM

## 2023-09-05 DIAGNOSIS — E782 Mixed hyperlipidemia: Secondary | ICD-10-CM | POA: Diagnosis not present

## 2023-09-05 DIAGNOSIS — N186 End stage renal disease: Secondary | ICD-10-CM

## 2023-09-05 DIAGNOSIS — Z794 Long term (current) use of insulin: Secondary | ICD-10-CM | POA: Diagnosis not present

## 2023-09-05 MED ORDER — NOVOLOG MIX 70/30 FLEXPEN (70-30) 100 UNIT/ML ~~LOC~~ SUPN: PEN_INJECTOR | SUBCUTANEOUS | 3 refills | Status: DC

## 2023-09-05 MED ORDER — DULAGLUTIDE 4.5 MG/0.5ML ~~LOC~~ SOAJ: 4.5000 mg | SUBCUTANEOUS | 4 refills | Status: DC

## 2023-09-05 MED ORDER — AMLODIPINE BESYLATE 10 MG PO TABS: 10.0000 mg | ORAL_TABLET | Freq: Every day | ORAL | 3 refills | Status: DC

## 2023-09-05 MED ORDER — CLOPIDOGREL BISULFATE 75 MG PO TABS: 75.0000 mg | ORAL_TABLET | Freq: Every day | ORAL | 3 refills | Status: DC

## 2023-09-05 MED ORDER — CARVEDILOL 6.25 MG PO TABS: 6.2500 mg | ORAL_TABLET | Freq: Two times a day (BID) | ORAL | 3 refills | Status: DC

## 2023-09-05 MED ORDER — ATORVASTATIN CALCIUM 80 MG PO TABS: 80.0000 mg | ORAL_TABLET | Freq: Every day | ORAL | 3 refills | Status: DC

## 2023-09-05 NOTE — Patient Instructions (Signed)
Decrease the insulin evening dose.  A1C today.  Follow up in 3 months.

## 2023-09-05 NOTE — Assessment & Plan Note (Signed)
Stable.  Medications refilled. 

## 2023-09-05 NOTE — Progress Notes (Signed)
Subjective:  Patient ID: [name], male    DOB: 1951/04/24  Age: 72 y.o. MRN: [id number]  CC:   Chief Complaint  Patient presents with   Hypertension    Needs all applicable medication redills   Diabetes    Needs rx for diabetic shoes to [facility]   new orders for home health     HPI:  72 year old male with ESRD, DM-2, HLD, HTN, History of Stroke presents for follow up.   BP stable. Needs medication refills.  Family member with him today. She feels that he needs a home health aide as she will not be able to be with him as she is going back to work.  He needs help at home given his comorbidities. Goes to dialysis Tuesday, Thursday, Saturaday.  He doesn't have his device that reads his CGM with him today. He reports frequent hypoglycemia at night. He is on Trulicity and Novolog 70/30 30 units BID.  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   Cerebrovascular accident (CVA) due to embolism of right posterior cerebral artery (HCC) 06/07/2023   ESRD (end stage renal disease) (HCC) 02/08/2023   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) 11/10/2022   Former smoker 07/12/2022   Diabetic neuropathy (HCC) 10/28/2021   Anemia in chronic kidney disease 05/27/2020   History of amputation of lesser toe of left foot (HCC) 08/18/2015   Hyperlipidemia, mixed 08/18/2015   Type 2 diabetes mellitus with peripheral neuropathy (HCC) 08/18/2015   Essential hypertension 04/17/2013    Social Hx   Social History   Socioeconomic History   Marital status: Married    Spouse name: [name]   Number of children: Not on file   Years of education: Not on file   Highest education level: GED or equivalent  Occupational History   Not on file  Tobacco Use   Smoking status: Former   Smokeless tobacco: Never  Vaping Use   Vaping status: Never Used  Substance and Sexual Activity   Alcohol use: Not Currently   Drug use: Never    Sexual activity: Not on file  Other Topics Concern   Not on file  Social History Narrative   Not on file   Social Determinants of Health   Financial Resource Strain: High Risk (09/05/2023)   Overall Financial Resource Strain (CARDIA)    Difficulty of Paying Living Expenses: Hard  Food Insecurity: Food Insecurity Present (09/05/2023)   Hunger Vital Sign    Worried About Running Out of Food in the Last Year: Often true    Ran Out of Food in the Last Year: Often true  Transportation Needs: No Transportation Needs (09/05/2023)   PRAPARE - Administrator, Civil Service (Medical): No    Lack of Transportation (Non-Medical): No  Physical Activity: Insufficiently Active (09/05/2023)   Exercise Vital Sign    Days of Exercise per Week: 3 days    Minutes of Exercise per Session: 10 min  Stress: No Stress Concern Present (09/05/2023)   [employer] of Occupational Health - Occupational Stress Questionnaire    Feeling of Stress : Only a little  Social Connections: Socially Integrated (09/05/2023)   Social Connection and Isolation Panel [NHANES]    Frequency of Communication with Friends and Family: More than three times a week    Frequency of Social Gatherings with Friends and Family: Once a week    Attends Religious Services: More  than 4 times per year    Active Member of Clubs or Organizations: Yes    Attends Banker Meetings: 1 to 4 times per year    Marital Status: Married    Review of Systems Per HPI  Objective:  BP (!) 138/58   Pulse 85   Temp (!) 97.2 F (36.2 C)   Ht 6' (1.829 m)   Wt 193 lb (87.5 kg)   SpO2 98%   BMI 26.18 kg/m      09/05/2023   10:24 AM 09/05/2023    9:52 AM 08/29/2023    2:12 PM  BP/Weight  Systolic BP 138 150 136  Diastolic BP 58 68 72  Wt. (Lbs)  193 191.8  BMI  26.18 kg/m2 26.01 kg/m2    Physical Exam  Lab Results  Component Value Date   WBC 8.1 08/28/2023   HGB 11.7 (L) 08/28/2023   HCT 34.7 (L) 08/28/2023    PLT 208 08/28/2023   GLUCOSE 247 (H) 08/28/2023   CHOL 95 05/31/2023   TRIG 315 (H) 05/31/2023   HDL 21 (L) 05/31/2023   LDLCALC 11 05/31/2023   ALT 13 08/28/2023   AST 19 08/28/2023   NA 137 08/28/2023   K 5.1 08/28/2023   CL 106 08/28/2023   CREATININE 6.72 (H) 08/28/2023   BUN 45 (H) 08/28/2023   CO2 19 (L) 08/28/2023   INR 1.0 05/30/2023   HGBA1C 9.1 (H) 05/30/2023     Assessment & Plan:   Problem List Items Addressed This Visit       Cardiovascular and Mediastinum   Essential hypertension (Chronic)    Stable. Medications refilled.       Relevant Medications   atorvastatin (LIPITOR) 80 MG tablet   amLODipine (NORVASC) 10 MG tablet   carvedilol (COREG) 6.25 MG tablet     Endocrine   Type 2 diabetes mellitus with peripheral neuropathy (HCC)    A1c today. Given hypoglycemia, decreasing evening dose of Novolog 70/30 to 22 units.      Relevant Medications   atorvastatin (LIPITOR) 80 MG tablet   Dulaglutide 4.5 MG/0.5ML SOAJ (Start on 09/09/2023)   NOVOLOG MIX 70/30 FLEXPEN (70-30) 100 UNIT/ML FlexPen   Other Relevant Orders   Hemoglobin A1c     Genitourinary   ESRD (end stage renal disease) (HCC)   Relevant Orders   Ambulatory referral to Home Health     Other   Hyperlipidemia, mixed (Chronic)    LDL well controlled. Continue Statin.       Relevant Medications   atorvastatin (LIPITOR) 80 MG tablet   amLODipine (NORVASC) 10 MG tablet   carvedilol (COREG) 6.25 MG tablet    Meds ordered this encounter  Medications   atorvastatin (LIPITOR) 80 MG tablet    Sig: Take 1 tablet (80 mg total) by mouth daily in the afternoon.    Dispense:  90 tablet    Refill:  3   amLODipine (NORVASC) 10 MG tablet    Sig: Take 1 tablet (10 mg total) by mouth daily in the afternoon.    Dispense:  90 tablet    Refill:  3   carvedilol (COREG) 6.25 MG tablet    Sig: Take 1 tablet (6.25 mg total) by mouth 2 (two) times daily.    Dispense:  180 tablet    Refill:  3    clopidogrel (PLAVIX) 75 MG tablet    Sig: Take 1 tablet (75 mg total) by mouth daily.    Dispense:  90 tablet    Refill:  3   Dulaglutide 4.5 MG/0.5ML SOAJ    Sig: Inject 4.5 mg into the skin every Sunday.    Dispense:  6 mL    Refill:  4   NOVOLOG MIX 70/30 FLEXPEN (70-30) 100 UNIT/ML FlexPen    Sig: 30 units for the morning dose and 22 units for the evening dose.    Dispense:  30 mL    Refill:  3    Follow-up:  3 months  Edwardo Wojnarowski Adriana Simas DO [facility]

## 2023-09-05 NOTE — Assessment & Plan Note (Signed)
LDL well controlled. Continue Statin.

## 2023-09-05 NOTE — Assessment & Plan Note (Signed)
A1c today. Given hypoglycemia, decreasing evening dose of Novolog 70/30 to 22 units.

## 2023-09-06 LAB — HEMOGLOBIN A1C
Est. average glucose Bld gHb Est-mCnc: 169 mg/dL
Hgb A1c MFr Bld: 7.5 % — ABNORMAL HIGH (ref 4.8–5.6)

## 2023-09-12 ENCOUNTER — Encounter (HOSPITAL_COMMUNITY)
Admission: RE | Admit: 2023-09-12 | Discharge: 2023-09-12 | Disposition: A | Discharge status: Home / Self Care | Payer: No Typology Code available for payment source | Source: Ambulatory Visit | Attending: Internal Medicine | Admitting: Internal Medicine

## 2023-09-12 ENCOUNTER — Encounter (HOSPITAL_COMMUNITY): Payer: Self-pay

## 2023-09-12 VITALS — BP 134/58 | HR 92 | Temp 97.2°F | Resp 18 | Ht 72.0 in | Wt 193.0 lb

## 2023-09-12 DIAGNOSIS — N186 End stage renal disease: Secondary | ICD-10-CM

## 2023-09-12 NOTE — Pre-Procedure Instructions (Signed)
Attempted pre-op phone call. Left VM for him to call us back. 

## 2023-09-12 NOTE — Patient Instructions (Signed)
[name]  09/12/2023     @PREFPERIOPPHARMACY @   Your procedure is scheduled on 09/14/2023.   Report to [facility] at 11:00 A.M.   Call this number if you have problems the morning of surgery:  [phone]  If you experience any cold or flu symptoms such as cough, fever, chills, shortness of breath, etc. between now and your scheduled surgery, please notify us at the above number.   Remember:   Do not eat  after midnight.   You may drink clear liquids until 9:00 .  Clear liquids allowed are:                    Water, Juice (No red color; non-citric and without pulp; diabetics please choose diet or no sugar options), Carbonated beverages (diabetics please choose diet or no sugar options), Clear Tea (No creamer, milk, or cream, including half & half and powdered creamer), and Clear Sports drink (No red color; diabetics please choose diet or no sugar options)    Take these medicines the morning of surgery with A SIP OF WATER : Amlodipine and Carvedilol    Do not wear jewelry, make-up or nail polish, including gel polish,  artificial nails, or any other type of covering on natural nails (fingers and  toes).  Do not wear lotions, powders, or perfumes, or deodorant.  Do not shave 48 hours prior to surgery.  Men may shave face and neck.  Do not bring valuables to the hospital.  [facility] is not responsible for any belongings or valuables.  Contacts, dentures or bridgework may not be worn into surgery.  Leave your suitcase in the car.  After surgery it may be brought to your room.  For patients admitted to the hospital, discharge time will be determined by your treatment team.  Patients discharged the day of surgery will not be allowed to drive home.   Name and phone number of your driver:   Family Special instructions:  N/A  Please read over the following fact sheets that you were given. Care and Recovery After Surgery   Transesophageal Echocardiogram Transesophageal  echocardiogram, or TEE, is a test that uses sound waves to make pictures of your heart. TEE is done using a small ultrasound probe. The probe is passed down your esophagus, which is the part of your body that moves food from your mouth to your stomach. Because your heart is near your esophagus, the TEE will give clear pictures of your heart. Your health care provider can use a TEE: To see how different parts of your heart are working. To check for problems with your heart, such as infection, blood clots, or growths. You may feel the probe in your throat, but the test usually doesn't cause pain or affect your breathing. Tell a health care provider about: Any allergies you have. All medicines you are taking. These include vitamins, herbs, eye drops, creams, and over-the-counter medicines. Any problems you or family members have had with anesthesia. Any bleeding problems you have. Any surgeries you have had. Any medical conditions you have. Any trouble with swallowing. Whether you have or have had a blockage of the esophagus. Whether you're pregnant or may be pregnant. What are the risks? Your provider will talk with you about risks. These may include: Damage to nearby structures or organs. A tear of the esophagus. Fast or uneven heartbeats. A hoarse voice or trouble swallowing. Bleeding. What happens before the procedure? Medicines Ask about changing or stopping:  Any medicines you take. Any vitamins, herbs, or supplements you take. Do not take aspirin or ibuprofen unless you're told to. General instructions Follow instructions about what you may eat and drink. You will need to take out any dentures or dental retainers. Ask if you'll be staying overnight in the hospital. If you'll be going home right after the test, plan to have a responsible adult: Drive you home from the hospital or clinic. You won't be allowed to drive. Stay with you for the time you are told. What happens during  the procedure?  An IV will be put into a vein in your hand or arm. You will be given: A sedative. This helps you relax. Anesthesia. This keeps you from feeling pain. It will be sprayed, or you'll gargle it, to numb the back of your throat. You may be asked to lie on your left side. A bite block will be put in your mouth. This keeps you from biting the probe. The tip of the probe will be placed into the back of your mouth. You'll be asked to swallow. Once the probe is in place, your provider will take pictures of your heart. The probe and bite block will be taken out after the test is done. The procedure may vary among providers and hospitals. What can I expect after the procedure? You will be watched closely until you leave. This includes checking your blood pressure, heart rate, breathing rate, and blood oxygen level. Your throat may feel numb or sore. This will get better over time. You will not be allowed to eat or drink until the numbness has gone away. Ask when your test results will be ready and how to get them. You may need to call or meet with your provider to discuss your results. This information is not intended to replace advice given to you by your health care provider. Make sure you discuss any questions you have with your health care provider. Document Revised: 11/29/2022 Document Reviewed: 11/29/2022 Elsevier Patient Education  2024 [employer] Inc[employer]  Monitored Anesthesia Care Anesthesia refers to the techniques, procedures, and medicines that help a person stay safe and comfortable during surgery. Monitored anesthesia care, or sedation, is one type of anesthesia. You may have sedation if you do not need to be asleep for your procedure. Procedures that use sedation may include: Surgery to remove cataracts from your eyes. A dental procedure. A biopsy. This is when a tissue sample is removed and looked at under a microscope. You will be watched closely during your procedure. Your  level of sedation or type of anesthesia may be changed to fit your needs. Tell a health care provider about: Any allergies you have. All medicines you are taking, including vitamins, herbs, eye drops, creams, and over-the-counter medicines. Any problems you or family members have had with anesthesia. Any bleeding problems you have. Any surgeries you have had. Any medical conditions or illnesses you have. This includes sleep apnea, cough, fever, or the flu. Whether you are pregnant or may be pregnant. Whether you use cigarettes, alcohol, or drugs. Any use of steroids, whether by mouth or as a cream. What are the risks? Your health care provider will talk with you about risks. These may include: Getting too much medicine (oversedation). Nausea. Allergic reactions to medicines. Trouble breathing. If this happens, a breathing tube may be used to help you breathe. It will be removed when you are awake and breathing on your own. Heart trouble. Lung trouble. Confusion that gets  better with time (emergence delirium). What happens before the procedure? When to stop eating and drinking Follow instructions from your health care provider about what you may eat and drink. These may include: 8 hours before your procedure Stop eating most foods. Do not eat meat, fried foods, or fatty foods. Eat only light foods, such as toast or crackers. All liquids are okay except energy drinks and alcohol. 6 hours before your procedure Stop eating. Drink only clear liquids, such as water, clear fruit juice, black coffee, plain tea, and sports drinks. Do not drink energy drinks or alcohol. 2 hours before your procedure Stop drinking all liquids. You may be allowed to take medicines with small sips of water. If you do not follow your health care provider's instructions, your procedure may be delayed or canceled. Medicines Ask your health care provider about: Changing or stopping your regular medicines. These  include any diabetes medicines or blood thinners you take. Taking medicines such as aspirin and ibuprofen. These medicines can thin your blood. Do not take them unless your health care provider tells you to. Taking over-the-counter medicines, vitamins, herbs, and supplements. Testing You may have an exam or testing. You may have a blood or urine sample taken. General instructions Do not use any products that contain nicotine or tobacco for at least 4 weeks before the procedure. These products include cigarettes, chewing tobacco, and vaping devices, such as e-cigarettes. If you need help quitting, ask your health care provider. If you will be going home right after the procedure, plan to have a responsible adult: Take you home from the hospital or clinic. You will not be allowed to drive. Care for you for the time you are told. What happens during the procedure?  Your blood pressure, heart rate, breathing, level of pain, and blood oxygen level will be monitored. An IV will be inserted into one of your veins. You may be given: A sedative. This helps you relax. Anesthesia. This will: Numb certain areas of your body. Make you fall asleep for surgery. You will be given medicines as needed to keep you comfortable. The more medicine you are given, the deeper your level of sedation will be. Your level of sedation may be changed to fit your needs. There are three levels of sedation: Mild sedation. At this level, you may feel awake and relaxed. You will be able to follow directions. Moderate sedation. At this level, you will be sleepy. You may not remember the procedure. Deep sedation. At this level, you will be asleep. You will not remember the procedure. How you get the medicines will depend on your age and the procedure. They may be given as: A pill. This may be taken by mouth (orally) or inserted into the rectum. An injection. This may be into a vein or muscle. A spray through the nose. After  your procedure is over, the medicine will be stopped. The procedure may vary among health care providers and hospitals. What happens after the procedure? Your blood pressure, heart rate, breathing rate, and blood oxygen level will be monitored until you leave the hospital or clinic. You may feel sleepy, clumsy, or nauseous. You may not remember what happened during or after the procedure. Sedation can affect you for several hours. Do not drive or use machinery until your health care provider says that it is safe. This information is not intended to replace advice given to you by your health care provider. Make sure you discuss any questions you have with  your health care provider. Document Revised: 02/13/2022 Document Reviewed: 02/13/2022 Elsevier Patient Education  2024 Elsevier Inc.   How to Use Chlorhexidine Before Surgery Chlorhexidine gluconate (CHG) is a germ-killing (antiseptic) solution that is used to clean the skin. It can get rid of the bacteria that normally live on the skin and can keep them away for about 24 hours. To clean your skin with CHG, you may be given: A CHG solution to use in the shower or as part of a sponge bath. A prepackaged cloth that contains CHG. Cleaning your skin with CHG may help lower the risk for infection: While you are staying in the intensive care unit of the hospital. If you have a vascular access, such as a central line, to provide short-term or long-term access to your veins. If you have a catheter to drain urine from your bladder. If you are on a ventilator. A ventilator is a machine that helps you breathe by moving air in and out of your lungs. After surgery. What are the risks? Risks of using CHG include: A skin reaction. Hearing loss, if CHG gets in your ears and you have a perforated eardrum. Eye injury, if CHG gets in your eyes and is not rinsed out. The CHG product catching fire. Make sure that you avoid smoking and flames after applying  CHG to your skin. Do not use CHG: If you have a chlorhexidine allergy or have previously reacted to chlorhexidine. On babies younger than 84 months of age. How to use CHG solution Use CHG only as told by your health care provider, and follow the instructions on the label. Use the full amount of CHG as directed. Usually, this is one bottle. During a shower Follow these steps when using CHG solution during a shower (unless your health care provider gives you different instructions): Start the shower. Use your normal soap and shampoo to wash your face and hair. Turn off the shower or move out of the shower stream. Pour the CHG onto a clean washcloth. Do not use any type of brush or rough-edged sponge. Starting at your neck, lather your body down to your toes. Make sure you follow these instructions: If you will be having surgery, pay special attention to the part of your body where you will be having surgery. Scrub this area for at least 1 minute. Do not use CHG on your head or face. If the solution gets into your ears or eyes, rinse them well with water. Avoid your genital area. Avoid any areas of skin that have broken skin, cuts, or scrapes. Scrub your back and under your arms. Make sure to wash skin folds. Let the lather sit on your skin for 1-2 minutes or as long as told by your health care provider. Thoroughly rinse your entire body in the shower. Make sure that all body creases and crevices are rinsed well. Dry off with a clean towel. Do not put any substances on your body afterward--such as powder, lotion, or perfume--unless you are told to do so by your health care provider. Only use lotions that are recommended by the manufacturer. Put on clean clothes or pajamas. If it is the night before your surgery, sleep in clean sheets.  During a sponge bath Follow these steps when using CHG solution during a sponge bath (unless your health care provider gives you different instructions): Use  your normal soap and shampoo to wash your face and hair. Pour the CHG onto a clean washcloth. Starting at your  neck, lather your body down to your toes. Make sure you follow these instructions: If you will be having surgery, pay special attention to the part of your body where you will be having surgery. Scrub this area for at least 1 minute. Do not use CHG on your head or face. If the solution gets into your ears or eyes, rinse them well with water. Avoid your genital area. Avoid any areas of skin that have broken skin, cuts, or scrapes. Scrub your back and under your arms. Make sure to wash skin folds. Let the lather sit on your skin for 1-2 minutes or as long as told by your health care provider. Using a different clean, wet washcloth, thoroughly rinse your entire body. Make sure that all body creases and crevices are rinsed well. Dry off with a clean towel. Do not put any substances on your body afterward--such as powder, lotion, or perfume--unless you are told to do so by your health care provider. Only use lotions that are recommended by the manufacturer. Put on clean clothes or pajamas. If it is the night before your surgery, sleep in clean sheets. How to use CHG prepackaged cloths Only use CHG cloths as told by your health care provider, and follow the instructions on the label. Use the CHG cloth on clean, dry skin. Do not use the CHG cloth on your head or face unless your health care provider tells you to. When washing with the CHG cloth: Avoid your genital area. Avoid any areas of skin that have broken skin, cuts, or scrapes. Before surgery Follow these steps when using a CHG cloth to clean before surgery (unless your health care provider gives you different instructions): Using the CHG cloth, vigorously scrub the part of your body where you will be having surgery. Scrub using a back-and-forth motion for 3 minutes. The area on your body should be completely wet with CHG when you are  done scrubbing. Do not rinse. Discard the cloth and let the area air-dry. Do not put any substances on the area afterward, such as powder, lotion, or perfume. Put on clean clothes or pajamas. If it is the night before your surgery, sleep in clean sheets.  For general bathing Follow these steps when using CHG cloths for general bathing (unless your health care provider gives you different instructions). Use a separate CHG cloth for each area of your body. Make sure you wash between any folds of skin and between your fingers and toes. Wash your body in the following order, switching to a new cloth after each step: The front of your neck, shoulders, and chest. Both of your arms, under your arms, and your hands. Your stomach and groin area, avoiding the genitals. Your right leg and foot. Your left leg and foot. The back of your neck, your back, and your buttocks. Do not rinse. Discard the cloth and let the area air-dry. Do not put any substances on your body afterward--such as powder, lotion, or perfume--unless you are told to do so by your health care provider. Only use lotions that are recommended by the manufacturer. Put on clean clothes or pajamas. Contact a health care provider if: Your skin gets irritated after scrubbing. You have questions about using your solution or cloth. You swallow any chlorhexidine. Call your local poison control center [phone] in the [place] Get help right away if: Your eyes itch badly, or they become very red or swollen. Your skin itches badly and is red or swollen. Your hearing  changes. You have trouble seeing. You have swelling or tingling in your mouth or throat. You have trouble breathing. These symptoms may represent a serious problem that is an emergency. Do not wait to see if the symptoms will go away. Get medical help right away. Call your local emergency services (911 in the [place] Do not drive yourself to the hospital. Summary Chlorhexidine  gluconate (CHG) is a germ-killing (antiseptic) solution that is used to clean the skin. Cleaning your skin with CHG may help to lower your risk for infection. You may be given CHG to use for bathing. It may be in a bottle or in a prepackaged cloth to use on your skin. Carefully follow your health care provider's instructions and the instructions on the product label. Do not use CHG if you have a chlorhexidine allergy. Contact your health care provider if your skin gets irritated after scrubbing. This information is not intended to replace advice given to you by your health care provider. Make sure you discuss any questions you have with your health care provider. Document Revised: 01/16/2022 Document Reviewed: 11/29/2020 Elsevier Patient Education  2023 [employer].

## 2023-09-14 ENCOUNTER — Ambulatory Visit (HOSPITAL_COMMUNITY)
Admission: RE | Admit: 2023-09-14 | Payer: No Typology Code available for payment source | Source: Home / Self Care | Admitting: Internal Medicine

## 2023-09-14 ENCOUNTER — Encounter (HOSPITAL_COMMUNITY): Admission: RE | Payer: Self-pay | Source: Home / Self Care

## 2023-09-14 DIAGNOSIS — I63431 Cerebral infarction due to embolism of right posterior cerebral artery: Secondary | ICD-10-CM

## 2023-09-14 SURGERY — TRANSESOPHAGEAL ECHOCARDIOGRAM (TEE)
Anesthesia: Monitor Anesthesia Care

## 2023-09-18 DIAGNOSIS — N186 End stage renal disease: Secondary | ICD-10-CM | POA: Diagnosis not present

## 2023-09-18 DIAGNOSIS — Z992 Dependence on renal dialysis: Secondary | ICD-10-CM | POA: Diagnosis not present

## 2023-09-25 ENCOUNTER — Telehealth: Payer: Self-pay | Admitting: Pharmacist

## 2023-10-02 DIAGNOSIS — Z992 Dependence on renal dialysis: Secondary | ICD-10-CM | POA: Diagnosis not present

## 2023-10-02 DIAGNOSIS — N186 End stage renal disease: Secondary | ICD-10-CM | POA: Diagnosis not present

## 2023-10-04 ENCOUNTER — Ambulatory Visit: Payer: No Typology Code available for payment source | Attending: Neurology

## 2023-10-04 ENCOUNTER — Telehealth: Payer: Self-pay

## 2023-10-04 DIAGNOSIS — D509 Iron deficiency anemia, unspecified: Secondary | ICD-10-CM | POA: Diagnosis not present

## 2023-10-04 DIAGNOSIS — D631 Anemia in chronic kidney disease: Secondary | ICD-10-CM | POA: Diagnosis not present

## 2023-10-04 DIAGNOSIS — R269 Unspecified abnormalities of gait and mobility: Secondary | ICD-10-CM

## 2023-10-04 DIAGNOSIS — I6381 Other cerebral infarction due to occlusion or stenosis of small artery: Secondary | ICD-10-CM

## 2023-10-04 DIAGNOSIS — Z992 Dependence on renal dialysis: Secondary | ICD-10-CM | POA: Diagnosis not present

## 2023-10-04 DIAGNOSIS — N186 End stage renal disease: Secondary | ICD-10-CM | POA: Diagnosis not present

## 2023-10-04 DIAGNOSIS — I63531 Cerebral infarction due to unspecified occlusion or stenosis of right posterior cerebral artery: Secondary | ICD-10-CM

## 2023-10-04 NOTE — Telephone Encounter (Signed)
-----   Message from [name] [place] [facility] sent at 09/14/2023  8:24 AM EST ----- Regarding: TEE R/S TEE for 10/22/2023 with VM- for CVA

## 2023-10-04 NOTE — Telephone Encounter (Signed)
 Patient rescheduled for TEE- 10/23/2023 @ 1 pm.   Pre-Op: 10/18/2023 at 11:30   Left a message for pt/spouse to give office a call regarding TEE.

## 2023-10-05 NOTE — Telephone Encounter (Signed)
 Left a message for pt/spouse to give office a call regarding TEE.   Letter mailed

## 2023-10-08 NOTE — Telephone Encounter (Signed)
 Spoke to patient who verbalized understanding of TEE and Pre- Op appointments. Pt had no questions or concerns at this time.

## 2023-10-16 ENCOUNTER — Telehealth: Payer: Self-pay | Admitting: Internal Medicine

## 2023-10-16 DIAGNOSIS — Z992 Dependence on renal dialysis: Secondary | ICD-10-CM | POA: Diagnosis not present

## 2023-10-16 DIAGNOSIS — E1122 Type 2 diabetes mellitus with diabetic chronic kidney disease: Secondary | ICD-10-CM | POA: Diagnosis not present

## 2023-10-16 DIAGNOSIS — N186 End stage renal disease: Secondary | ICD-10-CM | POA: Diagnosis not present

## 2023-10-16 NOTE — Telephone Encounter (Signed)
 Spoke to wife and verbalized that pt's procedure has moved to 1/24 with provider. Pre-Op has moved to 1/22 which is now a phone call. Pt will need lab work prior to procedure.

## 2023-10-16 NOTE — Telephone Encounter (Signed)
 Spoke to wife who asked to have TEE moved to 1/24 as pt has dialysis on Tues., Thuirs., & Sat. Called scheduler and left message for call back.

## 2023-10-16 NOTE — Telephone Encounter (Signed)
 Wife Wileen) stated patient is currently scheduled of his procedure on 1/21 which is a Tuesday and that is his dialysis days. Wife stated patient has dialysis on Tuesdays, Thursdays and Saturdays and procedure needs to be rescheduled to a non-dialysis day.  Wife noted patient is no longer able to see to read.

## 2023-10-18 ENCOUNTER — Encounter (HOSPITAL_COMMUNITY): Payer: No Typology Code available for payment source

## 2023-10-19 ENCOUNTER — Telehealth: Payer: Self-pay | Admitting: Pharmacist

## 2023-10-19 MED ORDER — FREESTYLE LIBRE 3 PLUS SENSOR MISC: 11 refills | Status: DC

## 2023-10-19 MED ORDER — FREESTYLE LIBRE 3 READER DEVI: 1 refills | Status: DC

## 2023-10-19 NOTE — Telephone Encounter (Signed)
PA for [facility] submitted via cover my meds Await decision

## 2023-10-19 NOTE — Telephone Encounter (Signed)
  Sent in [place] 3 PLUS sensors plus reader Company is phasing out [place] 2 and libre 3 (only libre 3 PLUS will be available) Sent Rxs to [employer]

## 2023-10-24 ENCOUNTER — Other Ambulatory Visit: Payer: Self-pay

## 2023-10-24 ENCOUNTER — Encounter (HOSPITAL_COMMUNITY)
Admission: RE | Admit: 2023-10-24 | Discharge: 2023-10-24 | Disposition: A | Discharge status: Home / Self Care | Payer: No Typology Code available for payment source | Source: Ambulatory Visit | Attending: Internal Medicine | Admitting: Internal Medicine

## 2023-10-24 ENCOUNTER — Encounter (HOSPITAL_COMMUNITY): Payer: Self-pay

## 2023-10-24 VITALS — Ht 72.0 in | Wt 192.9 lb

## 2023-10-24 DIAGNOSIS — N186 End stage renal disease: Secondary | ICD-10-CM

## 2023-10-26 ENCOUNTER — Ambulatory Visit (HOSPITAL_COMMUNITY)
Admission: RE | Admit: 2023-10-26 | Discharge: 2023-10-26 | Disposition: A | Discharge status: Home / Self Care | Payer: No Typology Code available for payment source | Source: Ambulatory Visit | Attending: Internal Medicine | Admitting: Internal Medicine

## 2023-10-26 ENCOUNTER — Ambulatory Visit (HOSPITAL_COMMUNITY): Payer: No Typology Code available for payment source | Admitting: Certified Registered"

## 2023-10-26 ENCOUNTER — Encounter (HOSPITAL_COMMUNITY)
Admission: RE | Disposition: A | Discharge status: Home / Self Care | Payer: Self-pay | Source: Ambulatory Visit | Attending: Internal Medicine

## 2023-10-26 ENCOUNTER — Telehealth: Payer: Self-pay | Admitting: Internal Medicine

## 2023-10-26 ENCOUNTER — Encounter (HOSPITAL_COMMUNITY): Payer: Self-pay | Admitting: Internal Medicine

## 2023-10-26 DIAGNOSIS — E1122 Type 2 diabetes mellitus with diabetic chronic kidney disease: Secondary | ICD-10-CM | POA: Diagnosis not present

## 2023-10-26 DIAGNOSIS — Z992 Dependence on renal dialysis: Secondary | ICD-10-CM | POA: Diagnosis not present

## 2023-10-26 DIAGNOSIS — Z87891 Personal history of nicotine dependence: Secondary | ICD-10-CM | POA: Diagnosis not present

## 2023-10-26 DIAGNOSIS — I63431 Cerebral infarction due to embolism of right posterior cerebral artery: Secondary | ICD-10-CM | POA: Insufficient documentation

## 2023-10-26 DIAGNOSIS — I120 Hypertensive chronic kidney disease with stage 5 chronic kidney disease or end stage renal disease: Secondary | ICD-10-CM | POA: Diagnosis not present

## 2023-10-26 DIAGNOSIS — N186 End stage renal disease: Secondary | ICD-10-CM | POA: Insufficient documentation

## 2023-10-26 DIAGNOSIS — Z538 Procedure and treatment not carried out for other reasons: Secondary | ICD-10-CM | POA: Diagnosis not present

## 2023-10-26 DIAGNOSIS — I639 Cerebral infarction, unspecified: Secondary | ICD-10-CM | POA: Diagnosis not present

## 2023-10-26 HISTORY — PX: TEE WITHOUT CARDIOVERSION: SHX5443

## 2023-10-26 LAB — GLUCOSE, CAPILLARY: Glucose-Capillary: 161 mg/dL — ABNORMAL HIGH (ref 70–99)

## 2023-10-26 LAB — POTASSIUM: Potassium: 3.8 mmol/L (ref 3.5–5.1)

## 2023-10-26 SURGERY — ECHOCARDIOGRAM, TRANSESOPHAGEAL
Anesthesia: General

## 2023-10-26 MED ORDER — BUTAMBEN-TETRACAINE-BENZOCAINE 2-2-14 % EX AERO
INHALATION_SPRAY | CUTANEOUS | Status: AC
  Filled 2023-10-26: qty 5

## 2023-10-26 MED ORDER — SODIUM CHLORIDE 0.9% FLUSH
3.0000 mL | Freq: Two times a day (BID) | INTRAVENOUS | Status: DC
Start: 2023-10-26 — End: 2023-10-28

## 2023-10-26 MED ORDER — LACTATED RINGERS IV SOLN: INTRAVENOUS | Status: DC

## 2023-10-26 MED ORDER — SODIUM CHLORIDE 0.9 % IV SOLN: INTRAVENOUS | Status: DC | PRN

## 2023-10-26 MED ORDER — ORAL CARE MOUTH RINSE: 15.0000 mL | Freq: Once | OROMUCOSAL | Status: DC

## 2023-10-26 MED ORDER — SODIUM CHLORIDE 0.9% FLUSH: 3.0000 mL | INTRAVENOUS | Status: DC | PRN

## 2023-10-26 MED ORDER — LIDOCAINE HCL (PF) 2 % IJ SOLN
INTRAMUSCULAR | Status: DC | PRN
  Administered 2023-10-26: 80 mg via INTRADERMAL

## 2023-10-26 MED ORDER — BUTAMBEN-TETRACAINE-BENZOCAINE 2-2-14 % EX AERO
INHALATION_SPRAY | CUTANEOUS | Status: DC | PRN
Start: 2023-10-26 — End: 2023-10-26
  Administered 2023-10-26 (×2): 1 via TOPICAL

## 2023-10-26 MED ORDER — PROPOFOL 500 MG/50ML IV EMUL
INTRAVENOUS | Status: DC | PRN
  Administered 2023-10-26 (×4): 50 mg via INTRAVENOUS

## 2023-10-26 MED ORDER — CHLORHEXIDINE GLUCONATE 0.12 % MT SOLN: 15.0000 mL | Freq: Once | OROMUCOSAL | Status: DC

## 2023-10-26 NOTE — CV Procedure (Signed)
TEE is scheduled to r/o cardio-embolic etiology of stroke. Patient arrived to the TEE suite. But there was difficulty in obtaining and securing IV access. TEE had to be canceled. CTA cardiac can be obtained to r/o LAA thrombus (TEE) but will need IV access to administer contrast dye. Need okay from nephrology as he still makes urine.   Traycen Goyer Verne Spurr, MD   [facility]  2:30 PM

## 2023-10-26 NOTE — Progress Notes (Signed)
Procedure was cancelled due to IV infiltrate and Dr. Johnnette Litter unable to start new IV .

## 2023-10-26 NOTE — Telephone Encounter (Signed)
Pt notified that it is ok to take all oral medications today.

## 2023-10-26 NOTE — Anesthesia Preprocedure Evaluation (Signed)
Anesthesia Evaluation  Patient identified by MRN, date of birth, ID band Patient awake    Reviewed: Allergy & Precautions, H&P , NPO status , Patient's Chart, lab work & pertinent test results, reviewed documented beta blocker date and time   Airway Mallampati: II  TM Distance: >3 FB Neck ROM: full    Dental no notable dental hx.    Pulmonary neg pulmonary ROS, former smoker   Pulmonary exam normal breath sounds clear to auscultation       Cardiovascular Exercise Tolerance: Good hypertension, negative cardio ROS  Rhythm:regular Rate:Normal     Neuro/Psych  Neuromuscular disease CVA negative neurological ROS  negative psych ROS   GI/Hepatic negative GI ROS, Neg liver ROS,GERD  ,,  Endo/Other  negative endocrine ROSdiabetes    Renal/GU Dialysis and ESRFRenal diseasenegative Renal ROS  negative genitourinary   Musculoskeletal   Abdominal   Peds  Hematology negative hematology ROS (+) Blood dyscrasia, anemia   Anesthesia Other Findings   Reproductive/Obstetrics negative OB ROS                             Anesthesia Physical Anesthesia Plan  ASA: 3  Anesthesia Plan: General   Post-op Pain Management:    Induction:   PONV Risk Score and Plan: Propofol infusion  Airway Management Planned:   Additional Equipment:   Intra-op Plan:   Post-operative Plan:   Informed Consent: I have reviewed the patients History and Physical, chart, labs and discussed the procedure including the risks, benefits and alternatives for the proposed anesthesia with the patient or authorized representative who has indicated his/her understanding and acceptance.     Dental Advisory Given  Plan Discussed with: CRNA  Anesthesia Plan Comments:        Anesthesia Quick Evaluation

## 2023-10-26 NOTE — Telephone Encounter (Signed)
Wife calling to see what medication the patient can and can't take for his procedure. Please advise

## 2023-10-26 NOTE — Progress Notes (Signed)
*  PRELIMINARY RESULTS* Echocardiogram Echocardiogram Transesophageal could not be performed due to I.V. infiltration. Anesthesia attempted another I.V. under ultrasound. This I.V. did not work either. After approximately 34 minutes physician called off procedure.  [name] 10/26/2023, 2:02 PM

## 2023-10-26 NOTE — Transfer of Care (Signed)
Immediate Anesthesia Transfer of Care Note  Patient: [name]  Procedure(s) Performed: TRANSESOPHAGEAL ECHOCARDIOGRAM (TEE)  Patient Location: PACU  Anesthesia Type:General  Level of Consciousness: awake, alert , and oriented  Airway & Oxygen Therapy: Patient Spontanous Breathing and Patient connected to nasal cannula oxygen  Post-op Assessment: Report given to RN and Post -op Vital signs reviewed and stable  Post vital signs: Reviewed and stable  Last Vitals:  Vitals Value Taken Time  BP 154/71 10/26/23 1348  Temp 36.7 C 10/26/23 1348  Pulse 69 10/26/23 1348  Resp 10 10/26/23 1348  SpO2 100 % 10/26/23 1348    Last Pain:  Vitals:   10/26/23 1348  TempSrc: Oral  PainSc: 0-No pain         Complications: No notable events documented.

## 2023-10-26 NOTE — Progress Notes (Signed)
Pt sitting up on stretcher drinking diet sprite and swallowing well and tolerated clear liquids well

## 2023-10-27 ENCOUNTER — Encounter (HOSPITAL_COMMUNITY): Payer: Self-pay | Admitting: Internal Medicine

## 2023-10-29 NOTE — Anesthesia Postprocedure Evaluation (Signed)
Anesthesia Post Note  Patient: [name]  Procedure(s) Performed: TRANSESOPHAGEAL ECHOCARDIOGRAM (TEE)  Patient location during evaluation: Phase II Anesthesia Type: General Level of consciousness: awake Pain management: pain level controlled Vital Signs Assessment: post-procedure vital signs reviewed and stable Respiratory status: spontaneous breathing and respiratory function stable Cardiovascular status: blood pressure returned to baseline and stable Postop Assessment: no headache and no apparent nausea or vomiting Anesthetic complications: no Comments: Late entry   No notable events documented.   Last Vitals:  Vitals:   10/26/23 1143 10/26/23 1348  BP: (!) 148/66 (!) 154/71  Pulse: 80 69  Resp: 14 10  Temp: 37.1 C 36.7 C  SpO2: 100% 100%    Last Pain:  Vitals:   10/26/23 1348  TempSrc: Oral  PainSc: 0-No pain                 Windell Norfolk

## 2023-11-02 DIAGNOSIS — Z992 Dependence on renal dialysis: Secondary | ICD-10-CM | POA: Diagnosis not present

## 2023-11-02 DIAGNOSIS — N186 End stage renal disease: Secondary | ICD-10-CM | POA: Diagnosis not present

## 2023-11-03 DIAGNOSIS — Z992 Dependence on renal dialysis: Secondary | ICD-10-CM | POA: Diagnosis not present

## 2023-11-03 DIAGNOSIS — D509 Iron deficiency anemia, unspecified: Secondary | ICD-10-CM | POA: Diagnosis not present

## 2023-11-03 DIAGNOSIS — D631 Anemia in chronic kidney disease: Secondary | ICD-10-CM | POA: Diagnosis not present

## 2023-11-03 DIAGNOSIS — N186 End stage renal disease: Secondary | ICD-10-CM | POA: Diagnosis not present

## 2023-11-08 ENCOUNTER — Ambulatory Visit: Payer: Self-pay | Admitting: Family Medicine

## 2023-11-08 NOTE — Telephone Encounter (Signed)
 Chief Complaint: Headache  Symptoms: Moderate headache Frequency: constant since onset around 11am  Pertinent Negatives: Patient denies nausea, vomiting, weakness, numbness, eye pain, blurred vision, change in speech Disposition: [] ED /[] Urgent Care (no appt availability in office) / [x] Appointment(In office/virtual)/ []  [facility]/ [] Home Care/ [] Refused Recommended Disposition /[] [facility]/ []  Follow-up with PCP Additional Notes: Patient states he had a headache shortly after leaving dialysis today. Patient's wife stated he was not acting like himself when she picked him up. Acting a little more tried than usual. Patient states he has a moderate headache but has not taken anything for pain yet. Care advice was given and advised patient to go to the ED if symptoms do not improve after trying the care advice or if symptoms get worse. Advised to keep scheduled appointment with PCP on Monday for evaluation. Patient verbalized understanding  Copied from CRM [id number]. Topic: Clinical - Red Word Triage >> Nov 08, 2023 12:53 PM [name] wrote: Red Word that prompted transfer to Nurse Triage: Pt called with headache and dizziness after dialysis. Reason for Disposition  [1] MODERATE headache (e.g., interferes with normal activities) AND [2] present > 24 hours AND [3] unexplained  (Exceptions: analgesics not tried, typical migraine, or headache part of viral illness)  Answer Assessment - Initial Assessment Questions 1. LOCATION: Where does it hurt?      All over  2. ONSET: When did the headache start? (Minutes, hours or days)      Today after dialysis 3. PATTERN: Does the pain come and go, or has it been constant since it started?     Constant  4. SEVERITY: How bad is the pain? and What does it keep you from doing?  (e.g., Scale 1-10; mild, moderate, or severe)   - MILD (1-3): doesn't interfere with normal activities    - MODERATE (4-7): interferes with normal  activities or awakens from sleep    - SEVERE (8-10): excruciating pain, unable to do any normal activities        Moderate 5. RECURRENT SYMPTOM: Have you ever had headaches before? If Yes, ask: When was the last time? and What happened that time?      Yes I had a stroke a few months ago  6. CAUSE: What do you think is causing the headache?     Low blood pressure  7. MIGRAINE: Have you been diagnosed with migraine headaches? If Yes, ask: Is this headache similar?      No 8. HEAD INJURY: Has there been any recent injury to the head?      No  9. OTHER SYMPTOMS: Do you have any other symptoms? (fever, stiff neck, eye pain, sore throat, cold symptoms)     No  Protocols used: Headache-A-AH

## 2023-11-12 ENCOUNTER — Telehealth: Payer: Self-pay

## 2023-11-12 ENCOUNTER — Ambulatory Visit (INDEPENDENT_AMBULATORY_CARE_PROVIDER_SITE_OTHER): Payer: No Typology Code available for payment source | Admitting: Family Medicine

## 2023-11-12 VITALS — BP 161/70 | HR 81 | Temp 97.1°F | Ht 72.0 in | Wt 191.0 lb

## 2023-11-12 DIAGNOSIS — E1142 Type 2 diabetes mellitus with diabetic polyneuropathy: Secondary | ICD-10-CM | POA: Diagnosis not present

## 2023-11-12 DIAGNOSIS — R413 Other amnesia: Secondary | ICD-10-CM | POA: Diagnosis not present

## 2023-11-12 DIAGNOSIS — I10 Essential (primary) hypertension: Secondary | ICD-10-CM

## 2023-11-12 NOTE — Patient Instructions (Addendum)
 I have reached out to neurology.  Continue your current medications.   Be sure to see the eye doctor.  Follow up in 3 months.

## 2023-11-12 NOTE — Telephone Encounter (Signed)
 Call to patient and wife, left message for patient and unable to leave message on wife. Trying to get in sooner per PCP request.

## 2023-11-13 DIAGNOSIS — Z992 Dependence on renal dialysis: Secondary | ICD-10-CM | POA: Diagnosis not present

## 2023-11-13 DIAGNOSIS — R413 Other amnesia: Secondary | ICD-10-CM | POA: Insufficient documentation

## 2023-11-13 DIAGNOSIS — N186 End stage renal disease: Secondary | ICD-10-CM | POA: Diagnosis not present

## 2023-11-13 NOTE — Progress Notes (Signed)
Subjective:  Patient ID: Branon Sabine, male    DOB: May 24, 1951  Age: 73 y.o. MRN: [id number]  CC:   Chief Complaint  Patient presents with   Diabetes    HPI:  73 year old male with history of CVA, hypertension, type 2 diabetes with complications including end-stage renal disease, and hyperlipidemia presents for follow-up.  Patient reports that his blood sugars are stable.  Most recent A1c 7.5.  He is compliant with NovoLog 70/30 and dulaglutide.  Denies hypoglycemia.  Patient hypertensive today.  He is compliant with amlodipine and carvedilol.  Pressure often drops with hemodialysis.  Patient's daughter is with him today.  She reports that he is having issues with his memory.  Has difficulty with recall and short-term memory.  Patient also reports that he has had some vision changes.  He states that he is having floaters.  However, he states that the objects appear to be moving.  He has an upcoming ophthalmology appointment.  Daughter is concerned that he is having hallucinations.  He periodically sees things or objects which are not there.  This has been going on for the past 2 weeks.  Follows with neurology.  Does not have an upcoming appointment for quite some time.   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) 06/07/2023   ESRD (end stage renal disease) (HCC) 02/08/2023   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) 11/10/2022   Former smoker 07/12/2022   Diabetic neuropathy (HCC) 10/28/2021   Anemia in chronic kidney disease 05/27/2020   History of amputation of lesser toe of left foot (HCC) 08/18/2015   Hyperlipidemia, mixed 08/18/2015   Type 2 diabetes mellitus with peripheral neuropathy (HCC) 08/18/2015   Essential hypertension 04/17/2013    Social Hx   Social History   Socioeconomic History   Marital status: Married    Spouse name: [name]    Number of children: Not on file   Years of education: Not on file   Highest education level: GED or equivalent  Occupational History   Not on file  Tobacco Use   Smoking status: Former   Smokeless tobacco: Never  Vaping Use   Vaping status: Never Used  Substance and Sexual Activity   Alcohol use: Not Currently   Drug use: Never   Sexual activity: Not on file  Other Topics Concern   Not on file  Social History Narrative   Not on file   Social Drivers of Health   Financial Resource Strain: High Risk (09/05/2023)   Overall Financial Resource Strain (CARDIA)    Difficulty of Paying Living Expenses: Hard  Food Insecurity: Food Insecurity Present (09/05/2023)   Hunger Vital Sign    Worried About Running Out of Food in the Last Year: Often true    Ran Out of Food in the Last Year: Often true  Transportation Needs: No Transportation Needs (09/05/2023)   PRAPARE - Administrator, Civil Service (Medical): No    Lack of Transportation (Non-Medical): No  Physical Activity: Insufficiently Active (09/05/2023)   Exercise Vital Sign    Days of Exercise per Week: 3 days    Minutes of Exercise per Session: 10 min  Stress: No Stress Concern Present (09/05/2023)   [employer] of Occupational Health - Occupational Stress Questionnaire    Feeling of Stress : Only a little  Social Connections: Socially Integrated (09/05/2023)   Social  Connection and Isolation Panel [NHANES]    Frequency of Communication with Friends and Family: More than three times a week    Frequency of Social Gatherings with Friends and Family: Once a week    Attends Religious Services: More than 4 times per year    Active Member of [employer] or Organizations: Yes    Attends Banker Meetings: 1 to 4 times per year    Marital Status: Married    Review of Systems Per HPI  Objective:  BP (!) 161/70   Pulse 81   Temp (!) 97.1 F (36.2 C)   Ht 6' (1.829 m)   Wt 191 lb (86.6 kg)   SpO2 98%   BMI  25.90 kg/m      11/12/2023    1:34 PM 11/12/2023    1:33 PM 11/12/2023    1:32 PM  BP/Weight  Systolic BP 161 165 184  Diastolic BP 70 66 69  Wt. (Lbs)   191  BMI   25.9 kg/m2    Physical Exam Vitals and nursing note reviewed.  Constitutional:      General: He is not in acute distress.    Appearance: Normal appearance.  HENT:     Head: Normocephalic and atraumatic.  Eyes:     General:        Right eye: No discharge.        Left eye: No discharge.     Conjunctiva/sclera: Conjunctivae normal.  Cardiovascular:     Rate and Rhythm: Normal rate and regular rhythm.  Pulmonary:     Effort: Pulmonary effort is normal.     Breath sounds: Normal breath sounds. No wheezing, rhonchi or rales.  Neurological:     Mental Status: He is alert.  Psychiatric:        Mood and Affect: Mood normal.        Behavior: Behavior normal.     Lab Results  Component Value Date   WBC 8.1 08/28/2023   HGB 11.7 (L) 08/28/2023   HCT 34.7 (L) 08/28/2023   PLT 208 08/28/2023   GLUCOSE 247 (H) 08/28/2023   CHOL 95 05/31/2023   TRIG 315 (H) 05/31/2023   HDL 21 (L) 05/31/2023   LDLCALC 11 05/31/2023   ALT 13 08/28/2023   AST 19 08/28/2023   NA 137 08/28/2023   K 3.8 10/26/2023   CL 106 08/28/2023   CREATININE 6.72 (H) 08/28/2023   BUN 45 (H) 08/28/2023   CO2 19 (L) 08/28/2023   INR 1.0 05/30/2023   HGBA1C 7.5 (H) 09/05/2023     Assessment & Plan:   Problem List Items Addressed This Visit       Cardiovascular and Mediastinum   Essential hypertension - Primary (Chronic)   BP elevated here today.  No changes in pharmacotherapy has he is on hemodialysis and often drops.        Endocrine   Type 2 diabetes mellitus with peripheral neuropathy (HCC)   Has been stable.  Continue current medications.        Other   Memory changes   Patient having short-term memory issues.  Prior CVA likely playing a role.  Concern for developing dementia/vascular dementia.  He is also having  hallucinations.  I reached out to neurology for a more urgent visit.  Neurology will reach out.       Follow-up:  3 months  Jalaysia Lobb Adriana Simas DO [facility]

## 2023-11-13 NOTE — Assessment & Plan Note (Signed)
BP elevated here today.  No changes in pharmacotherapy has he is on hemodialysis and often drops.

## 2023-11-13 NOTE — Assessment & Plan Note (Signed)
Has been stable. Continue current medications.

## 2023-11-13 NOTE — Assessment & Plan Note (Signed)
Patient having short-term memory issues.  Prior CVA likely playing a role.  Concern for developing dementia/vascular dementia.  He is also having hallucinations.  I reached out to neurology for a more urgent visit.  Neurology will reach out.

## 2023-11-14 ENCOUNTER — Other Ambulatory Visit: Payer: Self-pay | Admitting: Pharmacist

## 2023-11-15 NOTE — Telephone Encounter (Signed)
Wife returned call to get patient scheduled sooner per PCP, scheduled for 11/26/23

## 2023-11-19 ENCOUNTER — Encounter (INDEPENDENT_AMBULATORY_CARE_PROVIDER_SITE_OTHER): Payer: No Typology Code available for payment source

## 2023-11-19 ENCOUNTER — Other Ambulatory Visit (INDEPENDENT_AMBULATORY_CARE_PROVIDER_SITE_OTHER): Payer: Self-pay | Admitting: Nurse Practitioner

## 2023-11-19 ENCOUNTER — Ambulatory Visit (INDEPENDENT_AMBULATORY_CARE_PROVIDER_SITE_OTHER): Payer: No Typology Code available for payment source | Admitting: Vascular Surgery

## 2023-11-19 DIAGNOSIS — N186 End stage renal disease: Secondary | ICD-10-CM

## 2023-11-21 ENCOUNTER — Telehealth: Payer: Self-pay | Admitting: Anesthesiology

## 2023-11-21 NOTE — Telephone Encounter (Signed)
 Called pt and relayed Dr Karie Georges message regarding his cardiac monitor. Pt verbalized understanding and was grateful for the call. Pt had no additional questions, but was encouraged to call if any questions arise.

## 2023-11-21 NOTE — Progress Notes (Signed)
 Please call and advise the patient that the recent cardiac monitor did not show any evidence of atrial fibrillation. Please remind patient to continue current medications, keep any upcoming appointments or tests and to call us with any interim questions, concerns, problems or updates. Thanks,   Windell Norfolk, MD

## 2023-11-21 NOTE — Telephone Encounter (Signed)
-----   Message from [facility] sent at 11/21/2023  7:27 AM EST ----- Please call and advise the patient that the recent cardiac monitor did not show any evidence of atrial fibrillation. Please remind patient to continue current medications, keep any upcoming appointments or tests and to call us with any interim questions, concerns, problems or updates. Thanks,   Windell Norfolk, MD

## 2023-11-22 ENCOUNTER — Ambulatory Visit (INDEPENDENT_AMBULATORY_CARE_PROVIDER_SITE_OTHER): Payer: No Typology Code available for payment source | Admitting: Vascular Surgery

## 2023-11-22 ENCOUNTER — Encounter (INDEPENDENT_AMBULATORY_CARE_PROVIDER_SITE_OTHER): Payer: Self-pay | Admitting: Vascular Surgery

## 2023-11-22 ENCOUNTER — Ambulatory Visit (INDEPENDENT_AMBULATORY_CARE_PROVIDER_SITE_OTHER): Payer: No Typology Code available for payment source

## 2023-11-22 VITALS — BP 149/75 | HR 88 | Resp 18 | Ht 72.0 in | Wt 189.6 lb

## 2023-11-22 DIAGNOSIS — E782 Mixed hyperlipidemia: Secondary | ICD-10-CM

## 2023-11-22 DIAGNOSIS — E1142 Type 2 diabetes mellitus with diabetic polyneuropathy: Secondary | ICD-10-CM

## 2023-11-22 DIAGNOSIS — I10 Essential (primary) hypertension: Secondary | ICD-10-CM | POA: Diagnosis not present

## 2023-11-22 DIAGNOSIS — N186 End stage renal disease: Secondary | ICD-10-CM | POA: Diagnosis not present

## 2023-11-22 NOTE — Progress Notes (Signed)
 MRN : [id number]  [name] is a 73 y.o. ([birth year]) male who presents with chief complaint of check access.  History of Present Illness:   The patient reports the function of the access has been having some problems.  He notes that they have been struggling with clots recently.  Patient denies difficulty with cannulation. The patient denies increased bleeding time after removing the needles. The patient denies hand pain or other symptoms consistent with steal phenomena.  No significant arm swelling.   The patient denies any complaints from the dialysis center or their nephrologist.   The patient denies redness or swelling at the access site. The patient denies fever or chills at home or while on dialysis.   No recent shortening of the patient's walking distance or new symptoms consistent with claudication.  No history of rest pain symptoms. No new ulcers or wounds of the lower extremities have occurred.   The patient denies amaurosis fugax or recent TIA symptoms. There are no recent neurological changes noted. There is no history of DVT, PE or superficial thrombophlebitis. No recent episodes of angina or shortness of breath documented.    Duplex ultrasound of the AV access shows a patent access.  The previously noted stenosis is improved significantly  compared to last study.  Flow volume today is 1851 cc/min (previous flow volume was 1854 cc/min).  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  No outpatient medications have been marked as taking for the 11/22/23 encounter (Appointment) with Gilda Crease, Latina Craver, MD.    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)    Diabetic neuropathy (HCC)    Diabetic ulcer of foot associated with diabetes mellitus due to  underlying condition, limited to breakdown of skin (HCC)    left   GERD (gastroesophageal reflux disease)    Hyperlipidemia    Hypertension    Non compliance w medication regimen    Osteomyelitis (HCC) 08/2022   great toe of right foot   Osteomyelitis of left foot (HCC)    Secondary hyperparathyroidism of renal origin [facility])    per nephrology lov dr Thedore Mins 05-22-2022   Stroke [facility]) 2016   no deficits    Past Surgical History:  Procedure Laterality Date   AMPUTATION OF REPLICATED TOES Left 2018   left big toe, second toe   APPENDECTOMY     AV FISTULA PLACEMENT Left 10/20/2022   Procedure: INSERTION OF ARTERIOVENOUS (AV) GORE-TEX GRAFT ARM ( BRACHIAL AXILLARY);  Surgeon: Renford Dills, MD;  Location: [facility];  Service: Vascular;  Laterality: Left;   BIOPSY  08/10/2020   Procedure: BIOPSY;  Surgeon: Dolores Frame, MD;  [facility];  Service: Gastroenterology;;   CHOLECYSTECTOMY     COLON SURGERY     blockage   COLONOSCOPY WITH PROPOFOL N/A 08/10/2020   Procedure: COLONOSCOPY WITH PROPOFOL;  Surgeon: Dolores Frame, MD;  Location: [facility];  Service: Gastroenterology;  Laterality: N/A;  815   GRAFT APPLICATION Left 12/08/2019   Procedure: GRAFT APPLICATION;  Surgeon: Asencion Islam, DPM;  Location: [facility];  Service: Podiatry;  Laterality: Left;   HERNIA REPAIR  I & D EXTREMITY Left 10/10/2021   Procedure: IRRIGATION AND DEBRIDEMENT left foot;  Surgeon: Park Liter, DPM;  Location: [facility];  Service: Podiatry;  Laterality: Left;   I & D EXTREMITY Right 08/19/2022   Procedure: IRRIGATION AND DEBRIDEMENT RIGHT GREAT TOE GRAFT APPLICATION WITH BIOPSY;  Surgeon: Edwin Cap, DPM;  Location: [facility];  Service: Podiatry;  Laterality: Right;   IRRIGATION AND DEBRIDEMENT FOOT Left 10/16/2021   Procedure: IRRIGATION AND DEBRIDEMENT FOOT;  Surgeon: Asencion Islam, DPM;  Location: [facility];  Service: Podiatry;  Laterality:  Left;  Abscess left heel   METATARSAL HEAD EXCISION Left 12/08/2019   Procedure: METATARSAL HEAD RECESSION THIRD LEFT;  Surgeon: Asencion Islam, DPM;  Location: [facility];  Service: Podiatry;  Laterality: Left;   POLYPECTOMY  08/10/2020   Procedure: POLYPECTOMY;  Surgeon: Dolores Frame, MD;  [facility];  Service: Gastroenterology;;   TEE WITHOUT CARDIOVERSION N/A 10/26/2023   Procedure: TRANSESOPHAGEAL ECHOCARDIOGRAM (TEE);  Surgeon: Marjo Bicker, MD;  Location: [facility];  Service: Cardiovascular;  Laterality: N/A;   WOUND DEBRIDEMENT Left 12/08/2019   Procedure: DEBRIDEMENT WOUND;  Surgeon: Asencion Islam, DPM;  Location: [facility];  Service: Podiatry;  Laterality: Left;   WOUND DEBRIDEMENT Left 10/12/2021   Procedure: LEFT FOOT WOUND DEBRIDEMENT AND IRRIGATION, 1ST METATARSAL RESECTION;  Surgeon: Park Liter, DPM;  Location: [facility];  Service: Podiatry;  Laterality: Left;    Social History Social History   Tobacco Use   Smoking status: Former   Smokeless tobacco: Never  Advertising account planner   Vaping status: Never Used  Substance Use Topics   Alcohol use: Not Currently   Drug use: Never    Family History No family history on file.  Allergies  Allergen Reactions   Losartan Potassium Other (See Comments)    hyperkalemia   Sildenafil Other (See Comments)    Not effective. "Did not like the way it made me feel"     REVIEW OF SYSTEMS (Negative unless checked)  Constitutional: [] Weight loss  [] Fever  [] Chills Cardiac: [] Chest pain   [] Chest pressure   [] Palpitations   [] Shortness of breath when laying flat   [] Shortness of breath with exertion. Vascular:  [] Pain in legs with walking   [] Pain in legs at rest  [] History of DVT   [] Phlebitis   [] Swelling in legs   [] Varicose veins   [] Non-healing ulcers Pulmonary:   [] Uses home oxygen   [] Productive cough   [] Hemoptysis   [] Wheeze  [] COPD   [] Asthma Neurologic:  [] Dizziness    [] Seizures   [] History of stroke   [] History of TIA  [] Aphasia   [] Vissual changes   [] Weakness or numbness in arm   [] Weakness or numbness in leg Musculoskeletal:   [] Joint swelling   [] Joint pain   [] Low back pain Hematologic:  [] Easy bruising  [] Easy bleeding   [] Hypercoagulable state   [] Anemic Gastrointestinal:  [] Diarrhea   [] Vomiting  [] Gastroesophageal reflux/heartburn   [] Difficulty swallowing. Genitourinary:  [x] Chronic kidney disease   [] Difficult urination  [] Frequent urination   [] Blood in urine Skin:  [] Rashes   [] Ulcers  Psychological:  [] History of anxiety   []  History of major depression.  Physical Examination  There were no vitals filed for this visit. There is no height or weight on file to calculate BMI. Gen: WD/WN, NAD Head: [place]/AT, No temporalis wasting.  Ear/Nose/Throat: Hearing grossly intact, nares w/o erythema or drainage Eyes: PER, EOMI, sclera nonicteric.  Neck: Supple,  no gross masses or lesions.  No JVD.  Pulmonary:  Good air movement, no audible wheezing, no use of accessory muscles.  Cardiac: RRR, precordium non-hyperdynamic. Vascular:   Left arm AV access with weak thrill and marked pulsatility Vessel Right Left  Radial Palpable Palpable  Brachial Palpable Palpable  Gastrointestinal: soft, non-distended. No guarding/no peritoneal signs.  Musculoskeletal: M/S 5/5 throughout.  No deformity.  Neurologic: CN 2-12 intact. Pain and light touch intact in extremities.  Symmetrical.  Speech is fluent. Motor exam as listed above. Psychiatric: Judgment intact, Mood & affect appropriate for pt's clinical situation. Dermatologic: No rashes or ulcers noted.  No changes consistent with cellulitis.   CBC Lab Results  Component Value Date   WBC 8.1 08/28/2023   HGB 11.7 (L) 08/28/2023   HCT 34.7 (L) 08/28/2023   MCV 91.3 08/28/2023   PLT 208 08/28/2023    BMET    Component Value Date/Time   NA 137 08/28/2023 1150   K 3.8 10/26/2023 1221   CL 106 08/28/2023  1150   CO2 19 (L) 08/28/2023 1150   GLUCOSE 247 (H) 08/28/2023 1150   BUN 45 (H) 08/28/2023 1150   CREATININE 6.72 (H) 08/28/2023 1150   CREATININE 4.39 (H) 10/05/2022 0946   CALCIUM 8.3 (L) 08/28/2023 1150   GFRNONAA 8 (L) 08/28/2023 1150   CrCl cannot be calculated (Patient's most recent lab result is older than the maximum 21 days allowed.).  COAG Lab Results  Component Value Date   INR 1.0 05/30/2023    Radiology No results found.   Assessment/Plan 1. ESRD (end stage renal disease) (HCC) (Primary) Recommend:  The patient is experiencing increasing problems with their dialysis access.  Duplex ultrasound of the AV access shows a patent access.  The previously noted stenosis is improved significantly  compared to last study.  Flow volume today is 1851 cc/min (previous flow volume was 1854 cc/min).  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  Patient should have a fistulagram with the intention for intervention.  The intention for intervention is to restore appropriate flow and prevent thrombosis and possible loss of the access.  As well as improve the quality of dialysis therapy.  The risks, benefits and alternative therapies were reviewed in detail with the patient.  All questions were answered.  The patient agrees to proceed with angio/intervention.    The patient will follow up with me in the office after the procedure.   2. Hyperlipidemia, mixed Continue statin as ordered and reviewed, no changes at this time  3. Type 2 diabetes mellitus with peripheral neuropathy (HCC) Continue hypoglycemic medications as already ordered, these medications have been reviewed and there are no changes at this time.  Hgb A1C to be monitored as already arranged by primary service  4. Essential hypertension Continue antihypertensive medications as already ordered, these medications have been reviewed  and there are no changes at this time.    Levora Dredge, MD  11/22/2023 10:12 AM

## 2023-11-22 NOTE — H&P (View-Only) (Signed)
 MRN : [id number]  [name] is a 73 y.o. ([birth year]) male who presents with chief complaint of check access.  History of Present Illness:   The patient reports the function of the access has been having some problems.  He notes that they have been struggling with clots recently.  Patient denies difficulty with cannulation. The patient denies increased bleeding time after removing the needles. The patient denies hand pain or other symptoms consistent with steal phenomena.  No significant arm swelling.   The patient denies any complaints from the dialysis center or their nephrologist.   The patient denies redness or swelling at the access site. The patient denies fever or chills at home or while on dialysis.   No recent shortening of the patient's walking distance or new symptoms consistent with claudication.  No history of rest pain symptoms. No new ulcers or wounds of the lower extremities have occurred.   The patient denies amaurosis fugax or recent TIA symptoms. There are no recent neurological changes noted. There is no history of DVT, PE or superficial thrombophlebitis. No recent episodes of angina or shortness of breath documented.    Duplex ultrasound of the AV access shows a patent access.  The previously noted stenosis is improved significantly  compared to last study.  Flow volume today is 1851 cc/min (previous flow volume was 1854 cc/min).  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  No outpatient medications have been marked as taking for the 11/22/23 encounter (Appointment) with Gilda Crease, Latina Craver, MD.    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)    Diabetic neuropathy (HCC)    Diabetic ulcer of foot associated with diabetes mellitus due to  underlying condition, limited to breakdown of skin (HCC)    left   GERD (gastroesophageal reflux disease)    Hyperlipidemia    Hypertension    Non compliance w medication regimen    Osteomyelitis (HCC) 08/2022   great toe of right foot   Osteomyelitis of left foot (HCC)    Secondary hyperparathyroidism of renal origin [facility])    per nephrology lov dr Thedore Mins 05-22-2022   Stroke [facility]) 2016   no deficits    Past Surgical History:  Procedure Laterality Date   AMPUTATION OF REPLICATED TOES Left 2018   left big toe, second toe   APPENDECTOMY     AV FISTULA PLACEMENT Left 10/20/2022   Procedure: INSERTION OF ARTERIOVENOUS (AV) GORE-TEX GRAFT ARM ( BRACHIAL AXILLARY);  Surgeon: Renford Dills, MD;  Location: [facility];  Service: Vascular;  Laterality: Left;   BIOPSY  08/10/2020   Procedure: BIOPSY;  Surgeon: Dolores Frame, MD;  Location: [facility];  Service: Gastroenterology;;   CHOLECYSTECTOMY     COLON SURGERY     blockage   COLONOSCOPY WITH PROPOFOL N/A 08/10/2020   Procedure: COLONOSCOPY WITH PROPOFOL;  Surgeon: Dolores Frame, MD;  Location: [facility];  Service: Gastroenterology;  Laterality: N/A;  815   GRAFT APPLICATION Left 12/08/2019   Procedure: GRAFT APPLICATION;  Surgeon: Asencion Islam, DPM;  Location: [facility];  Service: Podiatry;  Laterality: Left;   HERNIA REPAIR  I & D EXTREMITY Left 10/10/2021   Procedure: IRRIGATION AND DEBRIDEMENT left foot;  Surgeon: Park Liter, DPM;  Location: [facility];  Service: Podiatry;  Laterality: Left;   I & D EXTREMITY Right 08/19/2022   Procedure: IRRIGATION AND DEBRIDEMENT RIGHT GREAT TOE GRAFT APPLICATION WITH BIOPSY;  Surgeon: Edwin Cap, DPM;  Location: [facility];  Service: Podiatry;  Laterality: Right;   IRRIGATION AND DEBRIDEMENT FOOT Left 10/16/2021   Procedure: IRRIGATION AND DEBRIDEMENT FOOT;  Surgeon: Asencion Islam, DPM;  Location: [facility];  Service: Podiatry;  Laterality:  Left;  Abscess left heel   METATARSAL HEAD EXCISION Left 12/08/2019   Procedure: METATARSAL HEAD RECESSION THIRD LEFT;  Surgeon: Asencion Islam, DPM;  Location: [facility];  Service: Podiatry;  Laterality: Left;   POLYPECTOMY  08/10/2020   Procedure: POLYPECTOMY;  Surgeon: Dolores Frame, MD;  [facility];  Service: Gastroenterology;;   TEE WITHOUT CARDIOVERSION N/A 10/26/2023   Procedure: TRANSESOPHAGEAL ECHOCARDIOGRAM (TEE);  Surgeon: Marjo Bicker, MD;  Location: [facility];  Service: Cardiovascular;  Laterality: N/A;   WOUND DEBRIDEMENT Left 12/08/2019   Procedure: DEBRIDEMENT WOUND;  Surgeon: Asencion Islam, DPM;  Location: [facility];  Service: Podiatry;  Laterality: Left;   WOUND DEBRIDEMENT Left 10/12/2021   Procedure: LEFT FOOT WOUND DEBRIDEMENT AND IRRIGATION, 1ST METATARSAL RESECTION;  Surgeon: Park Liter, DPM;  Location: [facility];  Service: Podiatry;  Laterality: Left;    Social History Social History   Tobacco Use   Smoking status: Former   Smokeless tobacco: Never  Advertising account planner   Vaping status: Never Used  Substance Use Topics   Alcohol use: Not Currently   Drug use: Never    Family History No family history on file.  Allergies  Allergen Reactions   Losartan Potassium Other (See Comments)    hyperkalemia   Sildenafil Other (See Comments)    Not effective. "Did not like the way it made me feel"     REVIEW OF SYSTEMS (Negative unless checked)  Constitutional: [] Weight loss  [] Fever  [] Chills Cardiac: [] Chest pain   [] Chest pressure   [] Palpitations   [] Shortness of breath when laying flat   [] Shortness of breath with exertion. Vascular:  [] Pain in legs with walking   [] Pain in legs at rest  [] History of DVT   [] Phlebitis   [] Swelling in legs   [] Varicose veins   [] Non-healing ulcers Pulmonary:   [] Uses home oxygen   [] Productive cough   [] Hemoptysis   [] Wheeze  [] COPD   [] Asthma Neurologic:  [] Dizziness    [] Seizures   [] History of stroke   [] History of TIA  [] Aphasia   [] Vissual changes   [] Weakness or numbness in arm   [] Weakness or numbness in leg Musculoskeletal:   [] Joint swelling   [] Joint pain   [] Low back pain Hematologic:  [] Easy bruising  [] Easy bleeding   [] Hypercoagulable state   [] Anemic Gastrointestinal:  [] Diarrhea   [] Vomiting  [] Gastroesophageal reflux/heartburn   [] Difficulty swallowing. Genitourinary:  [x] Chronic kidney disease   [] Difficult urination  [] Frequent urination   [] Blood in urine Skin:  [] Rashes   [] Ulcers  Psychological:  [] History of anxiety   []  History of major depression.  Physical Examination  There were no vitals filed for this visit. There is no height or weight on file to calculate BMI. Gen: WD/WN, NAD Head: [place]/AT, No temporalis wasting.  Ear/Nose/Throat: Hearing grossly intact, nares w/o erythema or drainage Eyes: PER, EOMI, sclera nonicteric.  Neck: Supple,  no gross masses or lesions.  No JVD.  Pulmonary:  Good air movement, no audible wheezing, no use of accessory muscles.  Cardiac: RRR, precordium non-hyperdynamic. Vascular:   Left arm AV access with weak thrill and marked pulsatility Vessel Right Left  Radial Palpable Palpable  Brachial Palpable Palpable  Gastrointestinal: soft, non-distended. No guarding/no peritoneal signs.  Musculoskeletal: M/S 5/5 throughout.  No deformity.  Neurologic: CN 2-12 intact. Pain and light touch intact in extremities.  Symmetrical.  Speech is fluent. Motor exam as listed above. Psychiatric: Judgment intact, Mood & affect appropriate for pt's clinical situation. Dermatologic: No rashes or ulcers noted.  No changes consistent with cellulitis.   CBC Lab Results  Component Value Date   WBC 8.1 08/28/2023   HGB 11.7 (L) 08/28/2023   HCT 34.7 (L) 08/28/2023   MCV 91.3 08/28/2023   PLT 208 08/28/2023    BMET    Component Value Date/Time   NA 137 08/28/2023 1150   K 3.8 10/26/2023 1221   CL 106 08/28/2023  1150   CO2 19 (L) 08/28/2023 1150   GLUCOSE 247 (H) 08/28/2023 1150   BUN 45 (H) 08/28/2023 1150   CREATININE 6.72 (H) 08/28/2023 1150   CREATININE 4.39 (H) 10/05/2022 0946   CALCIUM 8.3 (L) 08/28/2023 1150   GFRNONAA 8 (L) 08/28/2023 1150   CrCl cannot be calculated (Patient's most recent lab result is older than the maximum 21 days allowed.).  COAG Lab Results  Component Value Date   INR 1.0 05/30/2023    Radiology No results found.   Assessment/Plan 1. ESRD (end stage renal disease) (HCC) (Primary) Recommend:  The patient is experiencing increasing problems with their dialysis access.  Duplex ultrasound of the AV access shows a patent access.  The previously noted stenosis is improved significantly  compared to last study.  Flow volume today is 1851 cc/min (previous flow volume was 1854 cc/min).  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  Patient should have a fistulagram with the intention for intervention.  The intention for intervention is to restore appropriate flow and prevent thrombosis and possible loss of the access.  As well as improve the quality of dialysis therapy.  The risks, benefits and alternative therapies were reviewed in detail with the patient.  All questions were answered.  The patient agrees to proceed with angio/intervention.    The patient will follow up with me in the office after the procedure.   2. Hyperlipidemia, mixed Continue statin as ordered and reviewed, no changes at this time  3. Type 2 diabetes mellitus with peripheral neuropathy (HCC) Continue hypoglycemic medications as already ordered, these medications have been reviewed and there are no changes at this time.  Hgb A1C to be monitored as already arranged by primary service  4. Essential hypertension Continue antihypertensive medications as already ordered, these medications have been reviewed  and there are no changes at this time.    Levora Dredge, MD  11/22/2023 10:12 AM

## 2023-11-26 ENCOUNTER — Ambulatory Visit (INDEPENDENT_AMBULATORY_CARE_PROVIDER_SITE_OTHER): Payer: No Typology Code available for payment source | Admitting: Neurology

## 2023-11-26 ENCOUNTER — Encounter: Payer: Self-pay | Admitting: Neurology

## 2023-11-26 VITALS — BP 136/74 | Ht 72.0 in | Wt 192.0 lb

## 2023-11-26 DIAGNOSIS — G3184 Mild cognitive impairment, so stated: Secondary | ICD-10-CM

## 2023-11-26 DIAGNOSIS — I63531 Cerebral infarction due to unspecified occlusion or stenosis of right posterior cerebral artery: Secondary | ICD-10-CM | POA: Diagnosis not present

## 2023-11-26 MED ORDER — DONEPEZIL HCL 5 MG PO TABS: 5.0000 mg | ORAL_TABLET | Freq: Every day | ORAL | 11 refills | Status: DC

## 2023-11-26 NOTE — Patient Instructions (Signed)
 Continue current medications Start Aricept 5 mg nightly, side effect include vivid vivid dream, dizziness and diarrhea Continue to follow with PCP Return as scheduled in November.

## 2023-11-26 NOTE — Progress Notes (Signed)
 [facility]  PATIENT: [name] DOB: 1951-08-08  REQUESTING CLINICIAN: Tommie Sams, DO HISTORY FROM: Patient/Spouse/Chart review  REASON FOR VISIT: Stroke follow up   HISTORICAL  CHIEF COMPLAINT:  Chief Complaint  Patient presents with   New Patient (Initial Visit)    Rm 13, with wife, NP post stroke,   INTERVAL HISTORY 11/26/2023 Patient presents today for follow-up, last visit was in November, at that time he was diagnosed with a stroke in the right posterior cerebral artery causing left visual field cut.  Plan was for patient to follow-up in 1 year.  He did follow-up with his PCP who got concerned about his memory and complaints of visual distortion.  Today wife tells me that he is more forgetful, sometime he does not remember if he ate or drink.  Does not recall recent conversation, wife has to remind him multiple times during the day.  Other than that he has been fine, denies any fall, stated last Thursday during dialysis, he did have a syncopal episode for the first time.  At that time he was noted to be hypotensive. He has been compliant with his medications.    HISTORY OF PRESENT ILLNESS:  The patient is a 73 year old man with a history of end stage renal disease on dialysis, hypertension, hyperlipidemia, diabetes who is presenting following a stroke in August. He recently suffered a significant stroke, which has resulted in visual deficits and memory lapses. The stroke affected the posterior part of the brain, impacting the right occipital region and causing vision loss on the left side. The patient has to move his head to see on the left. Another stroke in the thalamus was noted, which could have caused left-sided deficits, but the patient has not reported any numbness or weakness. SInce leaving the hospital, he has been on DAPT for 3 months and currently on Plavix alone. He has not completed his cardiac monitor.    The patient also reports pain in the  right side of his leg, which he describes as a "jerk" feeling in the hip. He denies any recent falls. The patient's memory lapses have been significant, with frequent repetition and forgetfulness per spouse   The patient has diabetes and high cholesterol, and is on medication for hypertension. He was prescribed aspirin and Plavix following the stroke, but has since stopped the aspirin and is only taking Plavix. The patient sleeps well and denies any issues with sleep  Hospital Course and Summary  73 yr old male with history of ESRD on hemodialysis TTS, diabetes mellitus type 2, hyperlipidemia, GERD, history of stroke, hypertension came to ED with chief complaints of headache.  Also had change in vision.  Patient went to hemodialysis and after that developed headache with change in vision.  In the ED CT head showed acute infarct in the right occipital lobe. Neurology was consulted Acute infarct in the right occipital lobe -Likely embolic infarct, MRI brain showed acute ischemic stroke in the right PCA territory -Neurology consulted, recommend aspirin and Plavix for 3 months followed by Plavix 75 mg daily -Continue atorvastatin 80 mg daily -Continue permissive hypertension for 48 hours -MRI brain showed occlusion of right PCA in the P2 segment -Echocardiogram did not show intracardiac thrombus -Will need event monitor set up as outpatient, called cardiology, they will call and set up event monitor as outpatient.  OTHER MEDICAL CONDITIONS: ESRD on dialysis, Hypertension, Hyperlipidemia, DMII   REVIEW OF SYSTEMS: Full 14 system review of systems performed  and negative with exception of: As noted in the HPI  ALLERGIES: Allergies  Allergen Reactions   Losartan Potassium Other (See Comments)    hyperkalemia   Sildenafil Other (See Comments)    Not effective. "Did not like the way it made me feel"    HOME MEDICATIONS: Outpatient Medications Prior to Visit  Medication Sig Dispense Refill    amLODipine (NORVASC) 10 MG tablet Take 1 tablet (10 mg total) by mouth daily in the afternoon. 90 tablet 3   atorvastatin (LIPITOR) 80 MG tablet Take 1 tablet (80 mg total) by mouth daily in the afternoon. 90 tablet 3   calcitRIOL (ROCALTROL) 0.25 MCG capsule Take 0.25 mcg by mouth daily in the afternoon.     carvedilol (COREG) 6.25 MG tablet Take 1 tablet (6.25 mg total) by mouth 2 (two) times daily. 180 tablet 3   clopidogrel (PLAVIX) 75 MG tablet Take 1 tablet (75 mg total) by mouth daily. 90 tablet 3   Continuous Glucose Receiver (FREESTYLE LIBRE 3 READER) DEVI with compatible Freestyle Libre 3 sensor to monitor glucose continuously.DX: E11.65 1 each 1   Continuous Glucose Sensor (FREESTYLE LIBRE 3 PLUS SENSOR) MISC Use to test blood sugar continuously.  Change sensor every 15 days. DX:E11.65 2 each 11   docusate sodium (COLACE) 100 MG capsule Take 100 mg by mouth daily.     Dulaglutide 4.5 MG/0.5ML SOAJ Inject 4.5 mg into the skin every Sunday. 6 mL 4   lidocaine-prilocaine (EMLA) cream Apply 1 Application topically Every Tuesday,Thursday,and Saturday with dialysis.     NOVOLOG MIX 70/30 FLEXPEN (70-30) 100 UNIT/ML FlexPen 30 units for the morning dose and 22 units for the evening dose. (Patient taking differently: Inject 20-25 Units into the skin See admin instructions. Inject 25 units under the skin in the morning 20 units for the evening dose.) 30 mL 3   No facility-administered medications prior to visit.    PAST MEDICAL HISTORY: 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)    Diabetic neuropathy (HCC)    Diabetic ulcer of foot associated with diabetes mellitus due to underlying condition, limited to breakdown of skin (HCC)    left   GERD (gastroesophageal reflux disease)    Hyperlipidemia    Hypertension    Non compliance w medication regimen    Osteomyelitis (HCC)  08/2022   great toe of right foot   Osteomyelitis of left foot (HCC)    Secondary hyperparathyroidism of renal origin [facility])    per nephrology lov dr Thedore Mins 05-22-2022   Stroke (HCC) 2016   no deficits    PAST SURGICAL HISTORY: Past Surgical History:  Procedure Laterality Date   AMPUTATION OF REPLICATED TOES Left 2018   left big toe, second toe   APPENDECTOMY     AV FISTULA PLACEMENT Left 10/20/2022   Procedure: INSERTION OF ARTERIOVENOUS (AV) GORE-TEX GRAFT ARM ( BRACHIAL AXILLARY);  Surgeon: Renford Dills, MD;  Location: [facility];  Service: Vascular;  Laterality: Left;   BIOPSY  08/10/2020   Procedure: BIOPSY;  Surgeon: Dolores Frame, MD;  [facility];  Service: Gastroenterology;;   CHOLECYSTECTOMY     COLON SURGERY     blockage   COLONOSCOPY WITH PROPOFOL N/A 08/10/2020   Procedure: COLONOSCOPY WITH PROPOFOL;  Surgeon: Dolores Frame, MD;  [facility]: [facility];  Service: Gastroenterology;  Laterality:  N/A;  815   GRAFT APPLICATION Left 12/08/2019   Procedure: GRAFT APPLICATION;  Surgeon: Asencion Islam, DPM;  Location: [facility];  Service: Podiatry;  Laterality: Left;   HERNIA REPAIR     I & D EXTREMITY Left 10/10/2021   Procedure: IRRIGATION AND DEBRIDEMENT left foot;  Surgeon: Park Liter, DPM;  Location: [facility];  Service: Podiatry;  Laterality: Left;   I & D EXTREMITY Right 08/19/2022   Procedure: IRRIGATION AND DEBRIDEMENT RIGHT GREAT TOE GRAFT APPLICATION WITH BIOPSY;  Surgeon: Edwin Cap, DPM;  Location: [facility];  Service: Podiatry;  Laterality: Right;   IRRIGATION AND DEBRIDEMENT FOOT Left 10/16/2021   Procedure: IRRIGATION AND DEBRIDEMENT FOOT;  Surgeon: Asencion Islam, DPM;  Location: [facility];  Service: Podiatry;  Laterality: Left;  Abscess left heel   METATARSAL HEAD EXCISION Left 12/08/2019   Procedure: METATARSAL HEAD RECESSION THIRD LEFT;  Surgeon: Asencion Islam, DPM;  Location: [facility];  Service: Podiatry;  Laterality: Left;   POLYPECTOMY  08/10/2020   Procedure: POLYPECTOMY;  Surgeon: Dolores Frame, MD;  [facility];  Service: Gastroenterology;;   TEE WITHOUT CARDIOVERSION N/A 10/26/2023   Procedure: TRANSESOPHAGEAL ECHOCARDIOGRAM (TEE);  Surgeon: Marjo Bicker, MD;  Location: [facility];  Service: Cardiovascular;  Laterality: N/A;   WOUND DEBRIDEMENT Left 12/08/2019   Procedure: DEBRIDEMENT WOUND;  Surgeon: Asencion Islam, DPM;  Location: [facility];  Service: Podiatry;  Laterality: Left;   WOUND DEBRIDEMENT Left 10/12/2021   Procedure: LEFT FOOT WOUND DEBRIDEMENT AND IRRIGATION, 1ST METATARSAL RESECTION;  Surgeon: Park Liter, DPM;  Location[facility];  Service: Podiatry;  Laterality: Left;    FAMILY HISTORY: History reviewed. No pertinent family history.  SOCIAL HISTORY: Social History   Socioeconomic History   Marital status: Married    Spouse name: [name]   Number of children: Not on file   Years of education: Not on file   Highest education level: GED or equivalent  Occupational History   Not on file  Tobacco Use   Smoking status: Former   Smokeless tobacco: Never  Vaping Use   Vaping status: Never Used  Substance and Sexual Activity   Alcohol use: Not Currently   Drug use: Never   Sexual activity: Not on file  Other Topics Concern   Not on file  Social History Narrative   Ambidextrous   Caffeine-2 cups   Lives with wife   Retired Chartered certified accountant   Social Drivers of Health   Financial Resource Strain: High Risk (09/05/2023)   Overall Financial Resource Strain (CARDIA)    Difficulty of Paying Living Expenses: Hard  Food Insecurity: Food Insecurity Present (09/05/2023)   Hunger Vital Sign    Worried About Running Out of Food in the Last Year: Often true    Ran Out of Food in the Last Year: Often true  Transportation Needs: No Transportation Needs (09/05/2023)   PRAPARE - Doctor, general practice (Medical): No    Lack of Transportation (Non-Medical): No  Physical Activity: Insufficiently Active (09/05/2023)   Exercise Vital Sign    Days of Exercise per Week: 3 days    Minutes of Exercise per Session: 10 min  Stress: No Stress Concern Present (09/05/2023)   [employer] of Occupational Health - Occupational Stress Questionnaire    Feeling of Stress : Only a little  Social Connections: Socially Integrated (09/05/2023)   Social Connection and Isolation Panel [NHANES]    Frequency  of Communication with Friends and Family: More than three times a week    Frequency of Social Gatherings with Friends and Family: Once a week    Attends Religious Services: More than 4 times per year    Active Member of [employer] or Organizations: Yes    Attends Banker Meetings: 1 to 4 times per year    Marital Status: Married  Catering manager Violence: Not At Risk (05/31/2023)   Humiliation, Afraid, Rape, and Kick questionnaire    Fear of Current or Ex-Partner: No    Emotionally Abused: No    Physically Abused: No    Sexually Abused: No    PHYSICAL EXAM  GENERAL EXAM/CONSTITUTIONAL: Vitals:  Vitals:   11/26/23 1324  BP: 136/74  Weight: 192 lb (87.1 kg)  Height: 6' (1.829 m)   Body mass index is 26.04 kg/m. Wt Readings from Last 3 Encounters:  11/26/23 192 lb (87.1 kg)  11/22/23 189 lb 9.6 oz (86 kg)  11/12/23 191 lb (86.6 kg)   Patient is in no distress; well developed, nourished and groomed; neck is supple  MUSCULOSKELETAL: Gait, strength, tone, movements noted in Neurologic exam below  NEUROLOGIC: MENTAL STATUS:     11/26/2023    1:54 PM  MMSE - Mini Mental State Exam  Orientation to time 2  Orientation to Place 3  Registration 3  Attention/ Calculation 0  Recall 1  Language- name 2 objects 2  Language- repeat 1  Language- follow 3 step command 3  Language- read & follow direction 0  Write a sentence 0  Copy design 0  Total score 15     CRANIAL NERVE:  2nd, 3rd, 4th, 6th - He has a left visual field cut, extraocular muscles intact, no nystagmus 5th - facial sensation symmetric 7th - facial strength symmetric 8th - hearing intact 9th - palate elevates symmetrically, uvula midline 11th - shoulder shrug symmetric 12th - tongue protrusion midline  MOTOR:  normal bulk and tone, full strength in the BUE, BLE  SENSORY:  normal and symmetric to light touch  COORDINATION:  finger-nose-finger, fine finger movements normal  GAIT/STATION:  Antalgic gait,  walks with a cane   DIAGNOSTIC DATA (LABS, IMAGING, TESTING) - I reviewed patient records, labs, notes, testing and imaging myself where available.  Lab Results  Component Value Date   WBC 8.1 08/28/2023   HGB 11.7 (L) 08/28/2023   HCT 34.7 (L) 08/28/2023   MCV 91.3 08/28/2023   PLT 208 08/28/2023      Component Value Date/Time   NA 137 08/28/2023 1150   K 3.8 10/26/2023 1221   CL 106 08/28/2023 1150   CO2 19 (L) 08/28/2023 1150   GLUCOSE 247 (H) 08/28/2023 1150   BUN 45 (H) 08/28/2023 1150   CREATININE 6.72 (H) 08/28/2023 1150   CREATININE 4.39 (H) 10/05/2022 0946   CALCIUM 8.3 (L) 08/28/2023 1150   PROT 6.4 (L) 08/28/2023 1150   ALBUMIN 3.1 (L) 08/28/2023 1150   AST 19 08/28/2023 1150   ALT 13 08/28/2023 1150   ALKPHOS 65 08/28/2023 1150   BILITOT 1.0 08/28/2023 1150   GFRNONAA 8 (L) 08/28/2023 1150   Lab Results  Component Value Date   CHOL 95 05/31/2023   HDL 21 (L) 05/31/2023   LDLCALC 11 05/31/2023   TRIG 315 (H) 05/31/2023   CHOLHDL 4.5 05/31/2023   Lab Results  Component Value Date   HGBA1C 7.5 (H) 09/05/2023   No results found for: "VITAMINB12" No results found for: "  TSH"  MRI Brain 05/31/2023 1. Confluent acute to early subacute Right PCA territory infarct. Cytotoxic edema with possible minimal petechial hemorrhage. But no malignant hemorrhagic transformation or significant intracranial mass effect. 2. See intracranial MRA  reported separately. 3. 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  MRA Head and Neck 05/31/2023 1. Positive for occlusion of the Right PCA in the P2 segment, concordant with the Brain MRI today reported separately. 2. Evidence of intracranial atherosclerosis, but no other significant intracranial arterial stenosis.    ASSESSMENT AND PLAN  73 y.o. year old male with end stage renal disease on dialysis, hypertension, hyperlipidemia, diabetes, right PCA territory Stroke who no evidence of atrial fibrillation on cardiac monitor who is presenting with memory concerns.  In terms of his memory, he is forgetful, repetitive, misplacing items.  MMSE 15 out of 30 concerning for for memory loss.  He is independent all actives of daily living, limited by antalgic gait.  Plan will be to start patient on Aricept, 5 mg nightly and if able to tolerate it can increase to 10 mg nightly.  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  Advised him to continue current medication, I will see him as scheduled in November.    1. Cerebrovascular accident (CVA) due to occlusion of right posterior cerebral artery (HCC)   2. Mild cognitive impairment      Patient Instructions  Continue current medications Start Aricept 5 mg nightly, side effect include vivid vivid dream, dizziness and diarrhea Continue to follow with PCP Return as scheduled in November.  No orders of the defined types were placed in this encounter.   Meds ordered this encounter  Medications   donepezil (ARICEPT) 5 MG tablet    Sig: Take 1 tablet (5 mg total) by mouth at bedtime.    Dispense:  30 tablet    Refill:  11    No follow-ups on file.  I have spent a total of 45 minutes dedicated to this patient today, preparing to see patient, performing a medically appropriate examination and evaluation,  ordering tests and/or medications and procedures, and counseling and educating the patient/family/caregiver; independently interpreting result and communicating results to the family/patient/caregiver; and documenting clinical information in the electronic medical record.   Windell Norfolk, MD 11/26/2023, 5:22 PM  [facility] [address], [address] [place], [place] [address] [phone]

## 2023-11-26 NOTE — Progress Notes (Signed)
 [name]

## 2023-11-27 ENCOUNTER — Telehealth (INDEPENDENT_AMBULATORY_CARE_PROVIDER_SITE_OTHER): Payer: Self-pay

## 2023-11-27 NOTE — Telephone Encounter (Signed)
 Spoke with the patient's spouse and he is scheduled with Dr. Gilda Crease on 12/04/23 with a 1:00 pm arrival time to the [facility] for a left arm fistulagram w/ stent. Pre-procedure instructions were discussed and will be sent to Mychart and mailed.

## 2023-11-30 DIAGNOSIS — Z992 Dependence on renal dialysis: Secondary | ICD-10-CM | POA: Diagnosis not present

## 2023-11-30 DIAGNOSIS — N186 End stage renal disease: Secondary | ICD-10-CM | POA: Diagnosis not present

## 2023-12-01 DIAGNOSIS — D631 Anemia in chronic kidney disease: Secondary | ICD-10-CM | POA: Diagnosis not present

## 2023-12-01 DIAGNOSIS — D509 Iron deficiency anemia, unspecified: Secondary | ICD-10-CM | POA: Diagnosis not present

## 2023-12-01 DIAGNOSIS — Z992 Dependence on renal dialysis: Secondary | ICD-10-CM | POA: Diagnosis not present

## 2023-12-01 DIAGNOSIS — N186 End stage renal disease: Secondary | ICD-10-CM | POA: Diagnosis not present

## 2023-12-04 ENCOUNTER — Ambulatory Visit
Admission: RE | Admit: 2023-12-04 | Discharge: 2023-12-04 | Disposition: A | Discharge status: Home / Self Care | Payer: No Typology Code available for payment source | Attending: Vascular Surgery | Admitting: Vascular Surgery

## 2023-12-04 ENCOUNTER — Other Ambulatory Visit: Payer: Self-pay

## 2023-12-04 ENCOUNTER — Encounter
Admission: RE | Disposition: A | Discharge status: Home / Self Care | Payer: Self-pay | Source: Home / Self Care | Attending: Vascular Surgery

## 2023-12-04 ENCOUNTER — Ambulatory Visit: Payer: No Typology Code available for payment source | Admitting: Family Medicine

## 2023-12-04 DIAGNOSIS — E782 Mixed hyperlipidemia: Secondary | ICD-10-CM | POA: Insufficient documentation

## 2023-12-04 DIAGNOSIS — Y832 Surgical operation with anastomosis, bypass or graft as the cause of abnormal reaction of the patient, or of later complication, without mention of misadventure at the time of the procedure: Secondary | ICD-10-CM | POA: Insufficient documentation

## 2023-12-04 DIAGNOSIS — T82858A Stenosis of vascular prosthetic devices, implants and grafts, initial encounter: Secondary | ICD-10-CM | POA: Diagnosis not present

## 2023-12-04 DIAGNOSIS — Z87891 Personal history of nicotine dependence: Secondary | ICD-10-CM | POA: Diagnosis not present

## 2023-12-04 DIAGNOSIS — N186 End stage renal disease: Secondary | ICD-10-CM | POA: Diagnosis not present

## 2023-12-04 DIAGNOSIS — E1122 Type 2 diabetes mellitus with diabetic chronic kidney disease: Secondary | ICD-10-CM | POA: Insufficient documentation

## 2023-12-04 DIAGNOSIS — Z992 Dependence on renal dialysis: Secondary | ICD-10-CM | POA: Diagnosis not present

## 2023-12-04 DIAGNOSIS — I120 Hypertensive chronic kidney disease with stage 5 chronic kidney disease or end stage renal disease: Secondary | ICD-10-CM | POA: Diagnosis not present

## 2023-12-04 DIAGNOSIS — E1142 Type 2 diabetes mellitus with diabetic polyneuropathy: Secondary | ICD-10-CM | POA: Insufficient documentation

## 2023-12-04 LAB — GLUCOSE, CAPILLARY: Glucose-Capillary: 182 mg/dL — ABNORMAL HIGH (ref 70–99)

## 2023-12-04 LAB — POTASSIUM (ARMC VASCULAR LAB ONLY): Potassium (ARMC vascular lab): 4 mmol/L (ref 3.5–5.1)

## 2023-12-04 SURGERY — A/V FISTULAGRAM
Anesthesia: Moderate Sedation | Laterality: Left

## 2023-12-04 MED ORDER — MIDAZOLAM HCL 2 MG/ML PO SYRP: 8.0000 mg | ORAL_SOLUTION | Freq: Once | ORAL | Status: DC | PRN

## 2023-12-04 MED ORDER — CEFAZOLIN SODIUM-DEXTROSE 1-4 GM/50ML-% IV SOLN
1.0000 g | INTRAVENOUS | Status: AC
  Administered 2023-12-04: 1 g via INTRAVENOUS

## 2023-12-04 MED ORDER — DIPHENHYDRAMINE HCL 50 MG/ML IJ SOLN: 50.0000 mg | Freq: Once | INTRAMUSCULAR | Status: DC | PRN

## 2023-12-04 MED ORDER — CEFAZOLIN SODIUM-DEXTROSE 1-4 GM/50ML-% IV SOLN
INTRAVENOUS | Status: AC
Start: 2023-12-04 — End: ?
  Filled 2023-12-04: qty 50

## 2023-12-04 MED ORDER — HEPARIN SODIUM (PORCINE) 1000 UNIT/ML IJ SOLN
INTRAMUSCULAR | Status: DC | PRN
  Administered 2023-12-04: 4000 [IU] via INTRAVENOUS

## 2023-12-04 MED ORDER — HEPARIN SODIUM (PORCINE) 1000 UNIT/ML IJ SOLN
INTRAMUSCULAR | Status: AC
  Filled 2023-12-04: qty 10

## 2023-12-04 MED ORDER — FENTANYL CITRATE (PF) 100 MCG/2ML IJ SOLN
INTRAMUSCULAR | Status: AC
  Filled 2023-12-04: qty 2

## 2023-12-04 MED ORDER — MIDAZOLAM HCL 5 MG/5ML IJ SOLN
INTRAMUSCULAR | Status: AC
  Filled 2023-12-04: qty 5

## 2023-12-04 MED ORDER — ONDANSETRON HCL 4 MG/2ML IJ SOLN: 4.0000 mg | Freq: Four times a day (QID) | INTRAMUSCULAR | Status: DC | PRN

## 2023-12-04 MED ORDER — METHYLPREDNISOLONE SODIUM SUCC 125 MG IJ SOLR: 125.0000 mg | Freq: Once | INTRAMUSCULAR | Status: DC | PRN

## 2023-12-04 MED ORDER — IODIXANOL 320 MG/ML IV SOLN
INTRAVENOUS | Status: DC | PRN
  Administered 2023-12-04: 45 mL via INTRAVENOUS

## 2023-12-04 MED ORDER — HEPARIN (PORCINE) IN NACL 1000-0.9 UT/500ML-% IV SOLN
INTRAVENOUS | Status: DC | PRN
  Administered 2023-12-04: 500 mL

## 2023-12-04 MED ORDER — DIPHENHYDRAMINE HCL 50 MG/ML IJ SOLN
INTRAMUSCULAR | Status: DC | PRN
  Administered 2023-12-04: 25 mg via INTRAVENOUS

## 2023-12-04 MED ORDER — HYDROMORPHONE HCL 1 MG/ML IJ SOLN: 1.0000 mg | Freq: Once | INTRAMUSCULAR | Status: DC | PRN

## 2023-12-04 MED ORDER — FENTANYL CITRATE (PF) 100 MCG/2ML IJ SOLN
INTRAMUSCULAR | Status: DC | PRN
  Administered 2023-12-04: 50 ug via INTRAVENOUS
  Administered 2023-12-04: 25 ug via INTRAVENOUS

## 2023-12-04 MED ORDER — MIDAZOLAM HCL 2 MG/2ML IJ SOLN
INTRAMUSCULAR | Status: DC | PRN
  Administered 2023-12-04 (×2): 1 mg via INTRAVENOUS

## 2023-12-04 MED ORDER — LIDOCAINE HCL (PF) 1 % IJ SOLN
INTRAMUSCULAR | Status: DC | PRN
  Administered 2023-12-04: 5 mL

## 2023-12-04 MED ORDER — FAMOTIDINE 20 MG PO TABS: 40.0000 mg | ORAL_TABLET | Freq: Once | ORAL | Status: DC | PRN

## 2023-12-04 MED ORDER — SODIUM CHLORIDE 0.9 % IV SOLN: INTRAVENOUS | Status: DC

## 2023-12-04 SURGICAL SUPPLY — 15 items
BALLN DORADO 8X60X80 (BALLOONS) ×1 IMPLANT
BALLOON DORADO 8X60X80 (BALLOONS) IMPLANT
COVER PROBE ULTRASOUND 5X96 (MISCELLANEOUS) IMPLANT
DEVICE PRESTO INFLATION (MISCELLANEOUS) IMPLANT
DRAPE BRACHIAL (DRAPES) IMPLANT
GOWN STRL REUS W/ TWL LRG LVL3 (GOWN DISPOSABLE) ×1 IMPLANT
NDL ENTRY 21GA 7CM ECHOTIP (NEEDLE) IMPLANT
NEEDLE ENTRY 21GA 7CM ECHOTIP (NEEDLE) ×1 IMPLANT
PACK ANGIOGRAPHY (CUSTOM PROCEDURE TRAY) ×1 IMPLANT
SET INTRO CAPELLA COAXIAL (SET/KITS/TRAYS/PACK) IMPLANT
SHEATH BRITE TIP 6FRX5.5 (SHEATH) IMPLANT
SHEATH BRITE TIP 7FRX5.5 (SHEATH) IMPLANT
STENT COVERA FLARED 8X60X80 (Permanent Stent) IMPLANT
SUT MNCRL AB 4-0 PS2 18 (SUTURE) IMPLANT
WIRE SUPRACORE 190CM (WIRE) IMPLANT

## 2023-12-04 NOTE — Progress Notes (Signed)
 Dr.Schnier came to see patient before discharge. Appointment is to be set for three weeks out, but office didn't answer the phone. I left a voicemail for them to call pt to schedule appt.

## 2023-12-04 NOTE — Op Note (Signed)
 OPERATIVE NOTE   PROCEDURE: Contrast injection left brachial axillary AV access Percutaneous transluminal angioplasty and stent placement left brachial axillary venous anastomosis, peripheral segment  PRE-OPERATIVE DIAGNOSIS: Complication of dialysis access                                                       End Stage Renal Disease  POST-OPERATIVE DIAGNOSIS: same as above   SURGEON: Renford Dills, M.D.  ANESTHESIA: Conscious sedation was administered under my direct supervision by the interventional radiology RN. IV Versed plus fentanyl were utilized. Continuous ECG, pulse oximetry and blood pressure was monitored throughout the entire procedure.  Conscious sedation was for a total of 42 minutes.  ESTIMATED BLOOD LOSS: minimal  FINDING(S): Greater than 80% stenosis of the venous outflow left arm brachial axillary AV graft  SPECIMEN(S):  None  CONTRAST: 45 cc  FLUOROSCOPY TIME: 2.3 minutes  INDICATIONS: [name] is a 73 y.o. male who  presents with malfunctioning left arm AV access.  The patient is scheduled for angiography with possible intervention of the AV access.  The patient is aware the risks include but are not limited to: bleeding, infection, thrombosis of the cannulated access, and possible anaphylactic reaction to the contrast.  The patient acknowledges if the access can not be salvaged a tunneled catheter will be needed and will be placed during this procedure.  The patient is aware of the risks of the procedure and elects to proceed with the angiogram and intervention.  DESCRIPTION: After full informed written consent was obtained, the patient was brought back to the Special Procedure suite and placed supine position.  Appropriate cardiopulmonary monitors were placed.  The left arm straight was prepped and draped in the standard fashion.  Appropriate timeout is called. The left brachial axillary AV graft was cannulated with a micropuncture needle.  Cannulation  was performed with ultrasound guidance. Ultrasound was placed in a sterile sleeve, the AV access was interrogated and noted to be echolucent and compressible indicating patency. Image was recorded for the permanent record. The puncture is performed under continuous ultrasound visualization.   The microwire was advanced and the needle was exchanged for  a microsheath.  The J-wire was then advanced and a 6 Fr sheath inserted.  Hand injections were completed to image the access from the arterial anastomosis through the entire access.  The central venous structures were also imaged by hand injections.  Based on the images, 4000 units of heparin is given.  A 8 mm x 60 mm Covera stent was deployed across the stenoses and postdilated with an 8 mm Dorado balloon inflated to 20 atm for approximately 1 minute.  Follow-up imaging demonstrates complete resolution of the stricture, less than 5% residual stenosis, with rapid flow of contrast through the graft, the central venous anatomy is preserved.  A 4-0 Monocryl purse-string suture was sewn around the sheath.  The sheath was removed and light pressure was applied.  A sterile bandage was applied to the puncture site.    COMPLICATIONS: None  CONDITION: [name], M.D [facility] Office: [phone]  12/04/2023 4:45 PM

## 2023-12-04 NOTE — Interval H&P Note (Signed)
 History and Physical Interval Note:  12/04/2023 3:02 PM  [name]  has presented today for surgery, with the diagnosis of L arm fistula w possible stent    End Stage Renal.  The various methods of treatment have been discussed with the patient and family. After consideration of risks, benefits and other options for treatment, the patient has consented to  Procedure(s): A/V Fistulagram (Left) as a surgical intervention.  The patient's history has been reviewed, patient examined, no change in status, stable for surgery.  I have reviewed the patient's chart and labs.  Questions were answered to the patient's satisfaction.     Levora Dredge

## 2023-12-05 ENCOUNTER — Telehealth (INDEPENDENT_AMBULATORY_CARE_PROVIDER_SITE_OTHER): Payer: Self-pay | Admitting: Vascular Surgery

## 2023-12-05 ENCOUNTER — Encounter: Payer: Self-pay | Admitting: Vascular Surgery

## 2023-12-05 NOTE — Telephone Encounter (Signed)
 LVM for pt to call back for scheduling  Follow up with Schnier, Latina Craver, MD 3 weeks (Vascular Surgery); follow up after procedure with an HDA

## 2023-12-10 ENCOUNTER — Encounter (HOSPITAL_COMMUNITY): Payer: Self-pay | Admitting: Internal Medicine

## 2023-12-11 DIAGNOSIS — N186 End stage renal disease: Secondary | ICD-10-CM | POA: Diagnosis not present

## 2023-12-11 DIAGNOSIS — Z992 Dependence on renal dialysis: Secondary | ICD-10-CM | POA: Diagnosis not present

## 2023-12-16 DIAGNOSIS — H1131 Conjunctival hemorrhage, right eye: Secondary | ICD-10-CM | POA: Diagnosis not present

## 2023-12-31 DIAGNOSIS — N186 End stage renal disease: Secondary | ICD-10-CM | POA: Diagnosis not present

## 2023-12-31 DIAGNOSIS — Z992 Dependence on renal dialysis: Secondary | ICD-10-CM | POA: Diagnosis not present

## 2024-01-01 DIAGNOSIS — D509 Iron deficiency anemia, unspecified: Secondary | ICD-10-CM | POA: Diagnosis not present

## 2024-01-01 DIAGNOSIS — N186 End stage renal disease: Secondary | ICD-10-CM | POA: Diagnosis not present

## 2024-01-01 DIAGNOSIS — Z992 Dependence on renal dialysis: Secondary | ICD-10-CM | POA: Diagnosis not present

## 2024-01-01 DIAGNOSIS — D631 Anemia in chronic kidney disease: Secondary | ICD-10-CM | POA: Diagnosis not present

## 2024-01-02 ENCOUNTER — Ambulatory Visit

## 2024-01-02 DIAGNOSIS — E1142 Type 2 diabetes mellitus with diabetic polyneuropathy: Secondary | ICD-10-CM

## 2024-01-02 DIAGNOSIS — S98132A Complete traumatic amputation of one left lesser toe, initial encounter: Secondary | ICD-10-CM

## 2024-01-02 DIAGNOSIS — L84 Corns and callosities: Secondary | ICD-10-CM

## 2024-01-02 NOTE — Progress Notes (Signed)
 Visit Addendum to 08/2023 visit

## 2024-01-02 NOTE — Progress Notes (Signed)
 Re-order placed for shoes from eval 08/2023 [place] and inserts ordered as well

## 2024-01-15 DIAGNOSIS — N186 End stage renal disease: Secondary | ICD-10-CM | POA: Diagnosis not present

## 2024-01-15 DIAGNOSIS — E1122 Type 2 diabetes mellitus with diabetic chronic kidney disease: Secondary | ICD-10-CM | POA: Diagnosis not present

## 2024-01-15 DIAGNOSIS — Z992 Dependence on renal dialysis: Secondary | ICD-10-CM | POA: Diagnosis not present

## 2024-01-15 DIAGNOSIS — I120 Hypertensive chronic kidney disease with stage 5 chronic kidney disease or end stage renal disease: Secondary | ICD-10-CM | POA: Diagnosis not present

## 2024-01-15 DIAGNOSIS — Z87891 Personal history of nicotine dependence: Secondary | ICD-10-CM | POA: Diagnosis not present

## 2024-01-15 DIAGNOSIS — I10 Essential (primary) hypertension: Secondary | ICD-10-CM | POA: Diagnosis not present

## 2024-01-25 ENCOUNTER — Telehealth: Payer: Self-pay

## 2024-01-25 NOTE — Telephone Encounter (Signed)
 Shoes and inserts are in LM for patient to CB and schedule fitting appt  DM ppw expires 7.14.2025

## 2024-01-28 ENCOUNTER — Telehealth (INDEPENDENT_AMBULATORY_CARE_PROVIDER_SITE_OTHER): Payer: Self-pay | Admitting: Vascular Surgery

## 2024-01-28 NOTE — Telephone Encounter (Signed)
 Spoke with pt he states he is moving in May and needs a referral to the location he is moving to. Closing referral.

## 2024-01-30 DIAGNOSIS — Z992 Dependence on renal dialysis: Secondary | ICD-10-CM | POA: Diagnosis not present

## 2024-01-30 DIAGNOSIS — N186 End stage renal disease: Secondary | ICD-10-CM | POA: Diagnosis not present

## 2024-01-31 DIAGNOSIS — Z992 Dependence on renal dialysis: Secondary | ICD-10-CM | POA: Diagnosis not present

## 2024-01-31 DIAGNOSIS — N186 End stage renal disease: Secondary | ICD-10-CM | POA: Diagnosis not present

## 2024-02-05 DIAGNOSIS — D509 Iron deficiency anemia, unspecified: Secondary | ICD-10-CM | POA: Diagnosis not present

## 2024-02-05 DIAGNOSIS — D631 Anemia in chronic kidney disease: Secondary | ICD-10-CM | POA: Diagnosis not present

## 2024-02-05 DIAGNOSIS — Z23 Encounter for immunization: Secondary | ICD-10-CM | POA: Diagnosis not present

## 2024-02-05 DIAGNOSIS — Z992 Dependence on renal dialysis: Secondary | ICD-10-CM | POA: Diagnosis not present

## 2024-02-05 DIAGNOSIS — N186 End stage renal disease: Secondary | ICD-10-CM | POA: Diagnosis not present

## 2024-02-12 DIAGNOSIS — N186 End stage renal disease: Secondary | ICD-10-CM | POA: Diagnosis not present

## 2024-02-12 DIAGNOSIS — Z992 Dependence on renal dialysis: Secondary | ICD-10-CM | POA: Diagnosis not present

## 2024-02-13 ENCOUNTER — Other Ambulatory Visit

## 2024-02-13 ENCOUNTER — Ambulatory Visit (INDEPENDENT_AMBULATORY_CARE_PROVIDER_SITE_OTHER)

## 2024-02-13 DIAGNOSIS — E1142 Type 2 diabetes mellitus with diabetic polyneuropathy: Secondary | ICD-10-CM | POA: Diagnosis not present

## 2024-02-13 DIAGNOSIS — L84 Corns and callosities: Secondary | ICD-10-CM | POA: Diagnosis not present

## 2024-02-13 DIAGNOSIS — S98132A Complete traumatic amputation of one left lesser toe, initial encounter: Secondary | ICD-10-CM | POA: Diagnosis not present

## 2024-02-13 DIAGNOSIS — M2141 Flat foot [pes planus] (acquired), right foot: Secondary | ICD-10-CM

## 2024-02-19 ENCOUNTER — Encounter (INDEPENDENT_AMBULATORY_CARE_PROVIDER_SITE_OTHER): Payer: Self-pay

## 2024-02-19 DIAGNOSIS — N186 End stage renal disease: Secondary | ICD-10-CM | POA: Diagnosis not present

## 2024-02-19 DIAGNOSIS — Z992 Dependence on renal dialysis: Secondary | ICD-10-CM | POA: Diagnosis not present

## 2024-02-26 DIAGNOSIS — Z992 Dependence on renal dialysis: Secondary | ICD-10-CM | POA: Diagnosis not present

## 2024-02-26 DIAGNOSIS — N186 End stage renal disease: Secondary | ICD-10-CM | POA: Diagnosis not present

## 2024-03-04 DIAGNOSIS — D631 Anemia in chronic kidney disease: Secondary | ICD-10-CM | POA: Diagnosis not present

## 2024-03-04 DIAGNOSIS — N186 End stage renal disease: Secondary | ICD-10-CM | POA: Diagnosis not present

## 2024-03-04 DIAGNOSIS — Z23 Encounter for immunization: Secondary | ICD-10-CM | POA: Diagnosis not present

## 2024-03-04 DIAGNOSIS — Z992 Dependence on renal dialysis: Secondary | ICD-10-CM | POA: Diagnosis not present

## 2024-03-04 DIAGNOSIS — D509 Iron deficiency anemia, unspecified: Secondary | ICD-10-CM | POA: Diagnosis not present

## 2024-03-11 DIAGNOSIS — Z9181 History of falling: Secondary | ICD-10-CM | POA: Diagnosis not present

## 2024-03-11 DIAGNOSIS — E663 Overweight: Secondary | ICD-10-CM | POA: Diagnosis not present

## 2024-03-11 DIAGNOSIS — Z992 Dependence on renal dialysis: Secondary | ICD-10-CM | POA: Diagnosis not present

## 2024-03-11 DIAGNOSIS — Z6825 Body mass index (BMI) 25.0-25.9, adult: Secondary | ICD-10-CM | POA: Diagnosis not present

## 2024-03-11 DIAGNOSIS — Z89412 Acquired absence of left great toe: Secondary | ICD-10-CM | POA: Diagnosis not present

## 2024-03-11 DIAGNOSIS — I120 Hypertensive chronic kidney disease with stage 5 chronic kidney disease or end stage renal disease: Secondary | ICD-10-CM | POA: Diagnosis not present

## 2024-03-11 DIAGNOSIS — R2681 Unsteadiness on feet: Secondary | ICD-10-CM | POA: Diagnosis not present

## 2024-03-11 DIAGNOSIS — Z89422 Acquired absence of other left toe(s): Secondary | ICD-10-CM | POA: Diagnosis not present

## 2024-03-11 DIAGNOSIS — E785 Hyperlipidemia, unspecified: Secondary | ICD-10-CM | POA: Diagnosis not present

## 2024-03-11 DIAGNOSIS — Z008 Encounter for other general examination: Secondary | ICD-10-CM | POA: Diagnosis not present

## 2024-03-11 DIAGNOSIS — E1122 Type 2 diabetes mellitus with diabetic chronic kidney disease: Secondary | ICD-10-CM | POA: Diagnosis not present

## 2024-03-11 DIAGNOSIS — E1169 Type 2 diabetes mellitus with other specified complication: Secondary | ICD-10-CM | POA: Diagnosis not present

## 2024-03-11 DIAGNOSIS — E1142 Type 2 diabetes mellitus with diabetic polyneuropathy: Secondary | ICD-10-CM | POA: Diagnosis not present

## 2024-03-11 DIAGNOSIS — N186 End stage renal disease: Secondary | ICD-10-CM | POA: Diagnosis not present

## 2024-03-11 DIAGNOSIS — Z8673 Personal history of transient ischemic attack (TIA), and cerebral infarction without residual deficits: Secondary | ICD-10-CM | POA: Diagnosis not present

## 2024-03-11 DIAGNOSIS — Z794 Long term (current) use of insulin: Secondary | ICD-10-CM | POA: Diagnosis not present

## 2024-03-11 DIAGNOSIS — F17211 Nicotine dependence, cigarettes, in remission: Secondary | ICD-10-CM | POA: Diagnosis not present

## 2024-03-13 DIAGNOSIS — Z992 Dependence on renal dialysis: Secondary | ICD-10-CM | POA: Diagnosis not present

## 2024-03-13 DIAGNOSIS — N186 End stage renal disease: Secondary | ICD-10-CM | POA: Diagnosis not present

## 2024-03-24 ENCOUNTER — Encounter: Payer: Self-pay | Admitting: Family Medicine

## 2024-03-24 ENCOUNTER — Ambulatory Visit (INDEPENDENT_AMBULATORY_CARE_PROVIDER_SITE_OTHER): Admitting: Family Medicine

## 2024-03-24 VITALS — BP 144/69 | HR 82 | Temp 97.9°F | Ht 72.0 in | Wt 195.0 lb

## 2024-03-24 DIAGNOSIS — E1142 Type 2 diabetes mellitus with diabetic polyneuropathy: Secondary | ICD-10-CM

## 2024-03-24 DIAGNOSIS — Z794 Long term (current) use of insulin: Secondary | ICD-10-CM

## 2024-03-24 DIAGNOSIS — I10 Essential (primary) hypertension: Secondary | ICD-10-CM | POA: Diagnosis not present

## 2024-03-24 DIAGNOSIS — N186 End stage renal disease: Secondary | ICD-10-CM | POA: Diagnosis not present

## 2024-03-24 MED ORDER — CALCIUM ACETATE (PHOS BINDER) 667 MG PO CAPS: ORAL_CAPSULE | ORAL | 3 refills | Status: AC

## 2024-03-24 MED ORDER — FREESTYLE LIBRE 3 PLUS SENSOR MISC: 11 refills | Status: AC

## 2024-03-24 MED ORDER — LIDOCAINE-PRILOCAINE 2.5-2.5 % EX CREA
1.0000 | TOPICAL_CREAM | CUTANEOUS | 0 refills | Status: AC
Start: 2024-03-25 — End: ?

## 2024-03-24 NOTE — Patient Instructions (Signed)
 A1c today.  Medications refilled.  Take care  Dr. Bluford

## 2024-03-25 ENCOUNTER — Telehealth: Payer: Self-pay | Admitting: Pharmacy Technician

## 2024-03-25 ENCOUNTER — Other Ambulatory Visit: Payer: Self-pay

## 2024-03-25 ENCOUNTER — Other Ambulatory Visit (HOSPITAL_COMMUNITY): Payer: Self-pay

## 2024-03-25 NOTE — Telephone Encounter (Signed)
 Pharmacy Patient Advocate Encounter   Received notification from Onbase that prior authorization for Calcium  acetate 667 mg capsule is required/requested.   Insurance verification completed.   The patient is insured through [employer] .   Per test claim:Patient is on dialysis and medication has to be billed under Medicare Part B bundle payment to the dialysis facility. Pharmacy and patient is aware.

## 2024-03-25 NOTE — Assessment & Plan Note (Addendum)
 Last A1c was 7.5.  Needs A1c today.  Continue insulin  and Trulicity .

## 2024-03-25 NOTE — Assessment & Plan Note (Signed)
 Patient's PhosLo was refilled.  Emla  cream refilled as well.

## 2024-03-25 NOTE — Telephone Encounter (Signed)
 Pharmacy Patient Advocate Encounter   Received notification from Onbase that prior authorization for Lidocaine -prilocaine  cream is required/requested.   Insurance verification completed.   The patient is insured through [employer] .   Per test claim: Patient is on dialysis and medication has to billed to Medicare Part B bundle payment to the dialysis facility. Pharmacy and patient is aware.

## 2024-03-25 NOTE — Progress Notes (Signed)
 Subjective:  Patient ID: [name], male    DOB: 01/21/1951  Age: 73 y.o. MRN: [id number]  CC:   Chief Complaint  Patient presents with   Medication Refill    HPI:  73 year old male with the below mentioned medical problems presents for follow-up.  Patient accompanied by his daughter today.  He is overall doing well.  He needs a refill on his PhosLo.  I told him that I am happy to refill but this is typically handled by nephrology.  Also requesting Emla  cream to be used prior to hemodialysis.  Once again this is typically handled by nephrology as well.  BP mildly elevated here today.  Needs A1c.  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) 06/07/2023   ESRD (end stage renal disease) (HCC) 02/08/2023   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) 11/10/2022   Former smoker 07/12/2022   Diabetic neuropathy (HCC) 10/28/2021   Anemia in chronic kidney disease 05/27/2020   History of amputation of lesser toe of left foot (HCC) 08/18/2015   Hyperlipidemia, mixed 08/18/2015   Type 2 diabetes mellitus with peripheral neuropathy (HCC) 08/18/2015   Essential hypertension 04/17/2013    Social Hx   Social History   Socioeconomic History   Marital status: Married    Spouse name: Arlyne   Number of children: Not on file   Years of education: Not on file   Highest education level: GED or equivalent  Occupational History   Not on file  Tobacco Use   Smoking status: Former   Smokeless tobacco: Never  Vaping Use   Vaping status: Never Used  Substance and Sexual Activity   Alcohol use: Not Currently   Drug use: Never   Sexual activity: Not on file  Other Topics Concern   Not on file  Social History Narrative   Ambidextrous   Caffeine-2 cups   Lives with wife   Retired Chartered certified accountant   Social Drivers of Health   Financial Resource Strain: High Risk  (09/05/2023)   Overall Financial Resource Strain (CARDIA)    Difficulty of Paying Living Expenses: Hard  Food Insecurity: Food Insecurity Present (09/05/2023)   Hunger Vital Sign    Worried About Running Out of Food in the Last Year: Often true    Ran Out of Food in the Last Year: Often true  Transportation Needs: No Transportation Needs (09/05/2023)   PRAPARE - Administrator, Civil Service (Medical): No    Lack of Transportation (Non-Medical): No  Physical Activity: Insufficiently Active (09/05/2023)   Exercise Vital Sign    Days of Exercise per Week: 3 days    Minutes of Exercise per Session: 10 min  Stress: No Stress Concern Present (09/05/2023)   [employer] of Occupational Health - Occupational Stress Questionnaire    Feeling of Stress : Only a little  Social Connections: Socially Integrated (09/05/2023)   Social Connection and Isolation Panel    Frequency of Communication with Friends and Family: More than three times a week    Frequency of Social Gatherings with Friends and Family: Once a week    Attends Religious Services: More than 4 times per year    Active Member of [employer] or Organizations: Yes    Attends Banker Meetings: 1 to 4 times per year    Marital Status: Married    Review of Systems Per  HPI  Objective:  BP (!) 144/69   Pulse 82   Temp 97.9 F (36.6 C)   Ht 6' (1.829 m)   Wt 195 lb (88.5 kg)   SpO2 98%   BMI 26.45 kg/m      03/24/2024    3:21 PM 12/04/2023    4:15 PM 12/04/2023    4:00 PM  BP/Weight  Systolic BP 144 165 169  Diastolic BP 69 73 67  Wt. (Lbs) 195    BMI 26.45 kg/m2      Physical Exam Vitals and nursing note reviewed.  Constitutional:      General: He is not in acute distress.    Appearance: Normal appearance.   Cardiovascular:     Rate and Rhythm: Normal rate and regular rhythm.  Pulmonary:     Effort: Pulmonary effort is normal.     Breath sounds: Normal breath sounds. No wheezing or rales.    Neurological:     Mental Status: He is alert.   Psychiatric:        Mood and Affect: Mood normal.        Behavior: Behavior normal.     Lab Results  Component Value Date   WBC 8.1 08/28/2023   HGB 11.7 (L) 08/28/2023   HCT 34.7 (L) 08/28/2023   PLT 208 08/28/2023   GLUCOSE 247 (H) 08/28/2023   CHOL 95 05/31/2023   TRIG 315 (H) 05/31/2023   HDL 21 (L) 05/31/2023   LDLCALC 11 05/31/2023   ALT 13 08/28/2023   AST 19 08/28/2023   NA 137 08/28/2023   K 3.8 10/26/2023   CL 106 08/28/2023   CREATININE 6.72 (H) 08/28/2023   BUN 45 (H) 08/28/2023   CO2 19 (L) 08/28/2023   INR 1.0 05/30/2023   HGBA1C 7.5 (H) 09/05/2023     Assessment & Plan:  ESRD (end stage renal disease) (HCC) Assessment & Plan: Patient's PhosLo was refilled.  Emla  cream refilled as well.  Orders: -     Lidocaine -Prilocaine ; Apply 1 Application topically Every Tuesday,Thursday,and Saturday with dialysis.  Dispense: 30 g; Refill: 0 -     Calcium  Acetate (Phos Binder); 1334 mg with each meal.  Dispense: 180 capsule; Refill: 3  Type 2 diabetes mellitus with peripheral neuropathy (HCC) Assessment & Plan: Last A1c was 7.5.  Needs A1c today.  Continue insulin  and Trulicity .  Orders: -     FreeStyle Libre 3 Plus Sensor; Use to test blood sugar continuously.  Change sensor every 15 days. DX:E11.65  Dispense: 2 each; Refill: 11 -     Hemoglobin A1c  Essential hypertension Assessment & Plan: Fair control.  Continue current medication.  Will continue to monitor.     Follow-up: Pending A1c  Jacqulyn Ahle DO [facility]

## 2024-03-25 NOTE — Assessment & Plan Note (Signed)
 Fair control.  Continue current medication.  Will continue to monitor.

## 2024-03-27 NOTE — Progress Notes (Signed)
 Patient presents today to pick up diabetic shoes and insoles.  Patient was dispensed 1 pair of diabetic shoes and 3 ea right custom diabetic insoles. And Left toefiller Fit was satisfactory. Instructions for break-in and wear was reviewed and a copy was given to the patient.   Re-appointment for regularly scheduled diabetic foot care visits or if they should experience any trouble with the shoes or insoles.

## 2024-03-31 DIAGNOSIS — Z992 Dependence on renal dialysis: Secondary | ICD-10-CM | POA: Diagnosis not present

## 2024-03-31 DIAGNOSIS — N186 End stage renal disease: Secondary | ICD-10-CM | POA: Diagnosis not present

## 2024-04-01 DIAGNOSIS — D509 Iron deficiency anemia, unspecified: Secondary | ICD-10-CM | POA: Diagnosis not present

## 2024-04-01 DIAGNOSIS — D631 Anemia in chronic kidney disease: Secondary | ICD-10-CM | POA: Diagnosis not present

## 2024-04-01 DIAGNOSIS — E8779 Other fluid overload: Secondary | ICD-10-CM | POA: Diagnosis not present

## 2024-04-01 DIAGNOSIS — N186 End stage renal disease: Secondary | ICD-10-CM | POA: Diagnosis not present

## 2024-04-01 DIAGNOSIS — Z992 Dependence on renal dialysis: Secondary | ICD-10-CM | POA: Diagnosis not present

## 2024-04-01 DIAGNOSIS — Z23 Encounter for immunization: Secondary | ICD-10-CM | POA: Diagnosis not present

## 2024-04-29 DIAGNOSIS — N186 End stage renal disease: Secondary | ICD-10-CM | POA: Diagnosis not present

## 2024-04-29 DIAGNOSIS — Z992 Dependence on renal dialysis: Secondary | ICD-10-CM | POA: Diagnosis not present

## 2024-05-01 DIAGNOSIS — Z992 Dependence on renal dialysis: Secondary | ICD-10-CM | POA: Diagnosis not present

## 2024-05-01 DIAGNOSIS — N186 End stage renal disease: Secondary | ICD-10-CM | POA: Diagnosis not present

## 2024-05-02 DIAGNOSIS — Z23 Encounter for immunization: Secondary | ICD-10-CM | POA: Diagnosis not present

## 2024-05-02 DIAGNOSIS — Z992 Dependence on renal dialysis: Secondary | ICD-10-CM | POA: Diagnosis not present

## 2024-05-02 DIAGNOSIS — N186 End stage renal disease: Secondary | ICD-10-CM | POA: Diagnosis not present

## 2024-05-02 DIAGNOSIS — D631 Anemia in chronic kidney disease: Secondary | ICD-10-CM | POA: Diagnosis not present

## 2024-05-03 DIAGNOSIS — Z23 Encounter for immunization: Secondary | ICD-10-CM | POA: Diagnosis not present

## 2024-05-03 DIAGNOSIS — D631 Anemia in chronic kidney disease: Secondary | ICD-10-CM | POA: Diagnosis not present

## 2024-05-03 DIAGNOSIS — N186 End stage renal disease: Secondary | ICD-10-CM | POA: Diagnosis not present

## 2024-05-03 DIAGNOSIS — Z992 Dependence on renal dialysis: Secondary | ICD-10-CM | POA: Diagnosis not present

## 2024-05-08 DIAGNOSIS — N186 End stage renal disease: Secondary | ICD-10-CM | POA: Diagnosis not present

## 2024-05-08 DIAGNOSIS — Z992 Dependence on renal dialysis: Secondary | ICD-10-CM | POA: Diagnosis not present

## 2024-05-08 DIAGNOSIS — E1122 Type 2 diabetes mellitus with diabetic chronic kidney disease: Secondary | ICD-10-CM | POA: Diagnosis not present

## 2024-05-21 DIAGNOSIS — Z992 Dependence on renal dialysis: Secondary | ICD-10-CM | POA: Diagnosis not present

## 2024-05-21 DIAGNOSIS — T82858A Stenosis of vascular prosthetic devices, implants and grafts, initial encounter: Secondary | ICD-10-CM | POA: Diagnosis not present

## 2024-05-21 DIAGNOSIS — N186 End stage renal disease: Secondary | ICD-10-CM | POA: Diagnosis not present

## 2024-05-21 DIAGNOSIS — Y832 Surgical operation with anastomosis, bypass or graft as the cause of abnormal reaction of the patient, or of later complication, without mention of misadventure at the time of the procedure: Secondary | ICD-10-CM | POA: Diagnosis not present

## 2024-06-01 DIAGNOSIS — N186 End stage renal disease: Secondary | ICD-10-CM | POA: Diagnosis not present

## 2024-06-16 ENCOUNTER — Telehealth: Payer: Self-pay | Admitting: Pharmacy Technician

## 2024-06-16 ENCOUNTER — Other Ambulatory Visit (HOSPITAL_COMMUNITY): Payer: Self-pay

## 2024-06-16 ENCOUNTER — Other Ambulatory Visit: Payer: Self-pay | Admitting: Family Medicine

## 2024-06-16 DIAGNOSIS — E1142 Type 2 diabetes mellitus with diabetic polyneuropathy: Secondary | ICD-10-CM

## 2024-06-16 MED ORDER — DULAGLUTIDE 4.5 MG/0.5ML ~~LOC~~ SOAJ: 4.5000 mg | SUBCUTANEOUS | 4 refills | Status: DC

## 2024-06-16 NOTE — Telephone Encounter (Signed)
 Pharmacy Patient Advocate Encounter   Received notification from Onbase that prior authorization for Trulicity  4.5MG /0.5ML auto-injectors is required/requested.   Insurance verification completed.   The patient is insured through [place] .   Per test claim: The current 84 day co-pay is, $12.15.  No PA needed at this time. This test claim was processed through [facility]- copay amounts may vary at other pharmacies due to pharmacy/plan contracts, or as the patient moves through the different stages of their insurance plan.

## 2024-08-21 ENCOUNTER — Ambulatory Visit: Payer: No Typology Code available for payment source | Admitting: Neurology

## 2024-08-21 ENCOUNTER — Encounter: Payer: Self-pay | Admitting: Neurology

## 2024-08-22 ENCOUNTER — Telehealth: Payer: Self-pay | Admitting: *Deleted

## 2024-08-22 NOTE — Transitions of Care (Post Inpatient/ED Visit) (Signed)
   08/22/2024  Name: [name] MRN: [id number] DOB: 21-Jun-1951  Today's TOC FU Call Status: Today's TOC FU Call Status:: Unsuccessful Call (1st Attempt) Unsuccessful Call (1st Attempt) Date: 08/22/24  Attempted to reach the patient regarding the most recent Inpatient/ED visit.  Follow Up Plan: Additional outreach attempts will be made to reach the patient to complete the Transitions of Care (Post Inpatient/ED visit) call.   Mliss Creed [facility], BSN RN Care Manager/ Transition of Care [place]/ [facility] [phone]

## 2024-08-25 ENCOUNTER — Telehealth: Payer: Self-pay | Admitting: *Deleted

## 2024-08-25 NOTE — Transitions of Care (Post Inpatient/ED Visit) (Signed)
   08/25/2024  Name: [name] MRN: [id number] DOB: 03-03-1951  Today's TOC FU Call Status: Today's TOC FU Call Status:: Unsuccessful Call (2nd Attempt) Unsuccessful Call (2nd Attempt) Date: 08/25/24  Attempted to reach the patient regarding the most recent Inpatient/ED visit.  Follow Up Plan: Additional outreach attempts will be made to reach the patient to complete the Transitions of Care (Post Inpatient/ED visit) call.   Mliss Creed [facility], BSN RN Care Manager/ Transition of Care [place]/ [facility] [phone]

## 2024-08-26 ENCOUNTER — Telehealth: Payer: Self-pay | Admitting: *Deleted

## 2024-08-26 NOTE — Transitions of Care (Post Inpatient/ED Visit) (Signed)
   08/26/2024  Name: [name] MRN: [id number] DOB: 18-Jul-1951  Today's TOC FU Call Status: Today's TOC FU Call Status:: Unsuccessful Call (3rd Attempt) Unsuccessful Call (3rd Attempt) Date: 08/26/24  Attempted to reach the patient regarding the most recent Inpatient/ED visit.  Follow Up Plan: No further outreach attempts will be made at this time. We have been unable to contact the patient.  Mliss Creed [facility], BSN RN Care Manager/ Transition of Care [place]/ [facility] [phone]

## 2024-09-22 ENCOUNTER — Telehealth: Payer: Self-pay

## 2024-09-22 NOTE — Transitions of Care (Post Inpatient/ED Visit) (Signed)
 "  09/22/2024  Name: [name] MRN: [id number] DOB: 09-08-1951  Today's TOC FU Call Status: Today's TOC FU Call Status:: Successful TOC FU Call Completed TOC FU Call Complete Date: 09/22/24  Patient's Name and Date of Birth confirmed. DOB, Name  Transition Care Management Follow-up Telephone Call Date of Discharge: 09/17/24 Discharge Facility: Other ([facility]) Name of Other ([facility]) [facility]charge Facility: [facility] Type of Discharge: Inpatient Admission Primary Inpatient Discharge Diagnosis:: ESRD; s/p fistual revision How have you been since you were released from the hospital?: Better Any questions or concerns?: No  Items Reviewed: Did you receive and understand the discharge instructions provided?: Yes Medications obtained,verified, and reconciled?: Yes (Medications Reviewed) Any new allergies since your discharge?: No Do you have support at home?: Yes People in Home [RPT]: spouse  Medications Reviewed Today: Medications Reviewed Today     Reviewed by Lavelle Charmaine NOVAK, LPN (Licensed Practical Nurse) on 09/22/24 at 1351  Med List Status: <None>   Medication Order Taking? Sig Documenting Provider Last Dose Status Informant  amLODipine  (NORVASC ) 10 MG tablet [id number]  Take 1 tablet (10 mg total) by mouth daily in the afternoon. Cook, Jayce G, DO  Active Spouse/Significant Other  atorvastatin  (LIPITOR ) 80 MG tablet [id number]  Take 1 tablet (80 mg total) by mouth daily in the afternoon. Cook, Jayce G, DO  Active Spouse/Significant Other  calcitRIOL  (ROCALTROL ) 0.25 MCG capsule [id number]  Take 0.25 mcg by mouth daily in the afternoon. [provider]  Active Spouse/Significant Other  calcium  acetate (PHOSLO ) 667 MG capsule [id number]  1334 mg with each meal. Cook, Jayce G, DO  Active   carvedilol  (COREG ) 6.25 MG tablet [id number]  Take 1 tablet (6.25 mg total) by mouth 2 (two) times daily. Cook, Jayce G, DO  Active Spouse/Significant Other  clopidogrel   (PLAVIX ) 75 MG tablet [id number]  Take 1 tablet (75 mg total) by mouth daily. Cook, Jayce G, DO  Active Spouse/Significant Other  Continuous Glucose Sensor (FREESTYLE LIBRE 3 PLUS SENSOR) MISC [id number]  Use to test blood sugar continuously.  Change sensor every 15 days. DX:E11.65 Cook, Jayce G, DO  Active   docusate sodium  (COLACE) 100 MG capsule [id number]  Take 100 mg by mouth daily. [provider]  Active Spouse/Significant Other  donepezil  (ARICEPT ) 5 MG tablet [id number]  Take 1 tablet (5 mg total) by mouth at bedtime. Camara, Amadou, MD  Active   Dulaglutide  4.5 MG/0.5ML EMMANUEL [id number]  Inject 4.5 mg into the skin every Sunday. Cook, Jayce G, DO  Active   gabapentin (NEURONTIN) 100 MG capsule [id number] Yes Take 200 mg by mouth. [provider]  Active   insulin  glargine (LANTUS SOLOSTAR) 100 UNIT/ML Solostar Pen [id number] Yes Inject 8 Units into the skin at bedtime. [provider]  Active   insulin  lispro (HUMALOG) 100 UNIT/ML KwikPen [id number] Yes Inject 10 Units into the skin 3 (three) times daily. [provider]  Active   lidocaine -prilocaine  (EMLA ) cream [id number]  Apply 1 Application topically Every Tuesday,Thursday,and Saturday with dialysis. Cook, Jayce G, DO  Active    Patient taking differently:   Discontinued 09/22/24 1350 (Change in therapy) QUEtiapine (SEROQUEL) 25 MG tablet [id number] Yes Take 12.5 mg by mouth at bedtime. [provider]  Active   tamsulosin (FLOMAX) 0.4 MG CAPS capsule [id number] Yes Take 0.8 mg by mouth. [provider]  Active             Home Care and Equipment/Supplies: Were Home Health Services Ordered?: NA Any new equipment  or medical supplies ordered?: NA  Functional Questionnaire: Do you need assistance with bathing/showering or dressing?: No Do you need assistance with meal preparation?: No Do you need assistance with eating?: No Do you have difficulty maintaining continence: No Do you need  assistance with getting out of bed/getting out of a chair/moving?: No Do you have difficulty managing or taking your medications?: Yes  Follow up appointments reviewed: PCP Follow-up appointment confirmed?: Yes Date of PCP follow-up appointment?: 09/30/24 Follow-up Provider: Dr. Bluford [facility] Follow-up appointment confirmed?: NA Do you need transportation to your follow-up appointment?: No Do you understand care options if your condition(s) worsen?: Yes-patient verbalized understanding    SIGNATURE Charmaine Bloodgood, LPN [facility] Advisor [place] [facility] You Are. We Are. [address] [facility] [address] Direct Dial [phone]  "

## 2024-09-30 ENCOUNTER — Ambulatory Visit: Admitting: Family Medicine

## 2024-09-30 VITALS — BP 134/69 | HR 80 | Temp 98.6°F | Ht 72.0 in | Wt 194.1 lb

## 2024-09-30 DIAGNOSIS — Z794 Long term (current) use of insulin: Secondary | ICD-10-CM

## 2024-09-30 DIAGNOSIS — E1142 Type 2 diabetes mellitus with diabetic polyneuropathy: Secondary | ICD-10-CM

## 2024-09-30 DIAGNOSIS — N400 Enlarged prostate without lower urinary tract symptoms: Secondary | ICD-10-CM | POA: Diagnosis not present

## 2024-09-30 DIAGNOSIS — I10 Essential (primary) hypertension: Secondary | ICD-10-CM

## 2024-09-30 DIAGNOSIS — E782 Mixed hyperlipidemia: Secondary | ICD-10-CM | POA: Diagnosis not present

## 2024-09-30 DIAGNOSIS — Z8673 Personal history of transient ischemic attack (TIA), and cerebral infarction without residual deficits: Secondary | ICD-10-CM | POA: Diagnosis not present

## 2024-09-30 MED ORDER — TAMSULOSIN HCL 0.4 MG PO CAPS: 0.8000 mg | ORAL_CAPSULE | Freq: Every day | ORAL | 1 refills | Status: AC

## 2024-09-30 MED ORDER — ASPIRIN 81 MG PO TBEC: 81.0000 mg | DELAYED_RELEASE_TABLET | Freq: Every day | ORAL | 3 refills | Status: AC

## 2024-09-30 MED ORDER — DULAGLUTIDE 4.5 MG/0.5ML ~~LOC~~ SOAJ: 4.5000 mg | SUBCUTANEOUS | 4 refills | Status: AC

## 2024-09-30 MED ORDER — CARVEDILOL 6.25 MG PO TABS: 6.2500 mg | ORAL_TABLET | Freq: Two times a day (BID) | ORAL | 3 refills | Status: AC

## 2024-09-30 MED ORDER — ATORVASTATIN CALCIUM 80 MG PO TABS: 80.0000 mg | ORAL_TABLET | Freq: Every day | ORAL | 3 refills | Status: AC

## 2024-09-30 MED ORDER — LANTUS SOLOSTAR 100 UNIT/ML ~~LOC~~ SOPN: 8.0000 [IU] | PEN_INJECTOR | Freq: Every day | SUBCUTANEOUS | 0 refills | Status: AC

## 2024-09-30 MED ORDER — AMLODIPINE BESYLATE 10 MG PO TABS: 10.0000 mg | ORAL_TABLET | Freq: Every day | ORAL | 3 refills | Status: AC

## 2024-09-30 NOTE — Patient Instructions (Signed)
Medications as directed.  Follow up in 3 months.

## 2024-10-02 DIAGNOSIS — N400 Enlarged prostate without lower urinary tract symptoms: Secondary | ICD-10-CM | POA: Insufficient documentation

## 2024-10-02 DIAGNOSIS — Z8673 Personal history of transient ischemic attack (TIA), and cerebral infarction without residual deficits: Secondary | ICD-10-CM | POA: Insufficient documentation

## 2024-10-02 NOTE — Assessment & Plan Note (Signed)
At goal on Lipitor. Continue. 

## 2024-10-02 NOTE — Progress Notes (Signed)
 "  Subjective:  Patient ID: [name], male    DOB: 04/27/1951  Age: 74 y.o. MRN: [id number]  CC:   Chief Complaint  Patient presents with   Follow-up    Patient is here for a follow up visit from [facility], Patient is wanting to know about taking medications     HPI:  74 year old male presents for follow-up.  Patient recently admitted to [facility] for revision of his AV fistula.  He is currently doing well.  Patient's daughter has some concerns about his medication.  Will discuss today.  BP is well-controlled on amlodipine  and carvedilol .  Needs refill on Trulicity  as well as Lantus.  BPH stable on Flomax.  He was recently prescribed Seroquel.  Will discontinue.  He does not need this at this time.  Additionally, he was taken off Plavix .  Proceeding with just aspirin .  Patient Active Problem List   Diagnosis Date Noted   History of stroke 10/02/2024   Benign prostatic hyperplasia 10/02/2024   Memory changes 11/13/2023   ESRD (end stage renal disease) (HCC) 02/08/2023   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) 11/10/2022   Former smoker 07/12/2022   Diabetic neuropathy (HCC) 10/28/2021   Anemia in chronic kidney disease 05/27/2020   History of amputation of lesser toe of left foot 08/18/2015   Hyperlipidemia, mixed 08/18/2015   Type 2 diabetes mellitus with peripheral neuropathy (HCC) 08/18/2015   Essential hypertension 04/17/2013    Social Hx   Social History   Socioeconomic History   Marital status: Married    Spouse name: Arlyne   Number of children: Not on file   Years of education: Not on file   Highest education level: GED or equivalent  Occupational History   Not on file  Tobacco Use   Smoking status: Former   Smokeless tobacco: Never  Vaping Use   Vaping status: Never Used  Substance and Sexual Activity   Alcohol use: Not Currently   Drug use: Never   Sexual activity: Not on file  Other Topics Concern   Not on file  Social History Narrative    Ambidextrous   Caffeine-2 cups   Lives with wife   Retired chartered certified accountant   Social Drivers of Health   Tobacco Use: Medium Risk (09/15/2024)   Received from [name]   Patient History    Smoking Tobacco Use: Former    Smokeless Tobacco Use: Never    Passive Exposure: Not on file  Financial Resource Strain: Medium Risk (09/15/2024)   Received from [facility]   Overall Financial Resource Strain (CARDIA)    Difficulty of Paying Living Expenses: Somewhat hard  Food Insecurity: Food Insecurity Present (09/15/2024)   Received from [facility]   Epic    Within the past 12 months, you worried that your food would run out before you got the money to buy more.: Sometimes true    Within the past 12 months, the food you bought just didn't last and you didn't have money to get more.: Sometimes true  Transportation Needs: No Transportation Needs (09/16/2024)   Received from [facility] - Transportation    In the past 12 months, has lack of transportation kept you from medical appointments or from getting medications?: No    Lack of Transportation (Non-Medical): No  Recent Concern: Transportation Needs - Unmet Transportation Needs (08/13/2024)   Received from [facility]   [facility] - Transportation  In the past 12 months, has lack of transportation kept you from medical appointments or from getting medications?: Yes    Lack of Transportation (Non-Medical): No  Physical Activity: Sufficiently Active (08/26/2024)   Received from [facility]   Exercise Vital Sign    On average, how many days per week do you engage in moderate to strenuous exercise (like a brisk walk)?: 3 days    On average, how many minutes do you engage in exercise at this level?: 60 min  Stress: No Stress Concern Present (08/26/2024)   Received from [facility]  Occupational Health - Occupational Stress Questionnaire    Feeling of Stress : Only a little  Social Connections: Moderately Integrated (08/26/2024)   Received from [facility]   Social Connection and Isolation Panel    In a typical week, how many times do you talk on the phone with family, friends, or neighbors?: More than three times a week    How often do you get together with friends or relatives?: Patient unable to answer    How often do you attend church or religious services?: More than 4 times per year    Do you belong to any clubs or organizations such as church groups, unions, fraternal or athletic groups, or school groups?: No    How often do you attend meetings of the clubs or organizations you belong to?: Never    Are you married, widowed, divorced, separated, never married, or living with a partner?: Married  Depression (PHQ2-9): Medium Risk (09/30/2024)   Depression (PHQ2-9)    PHQ-2 Score: 9  Alcohol Screen: Not on file  Housing: High Risk (09/15/2024)   Received from [facility]   Epic    In the last 12 months, was there a time when you were not able to pay the mortgage or rent on time?: Yes    In the past 12 months, how many times have you moved where you were living?: 0    At any time in the past 12 months, were you homeless or living in a shelter (including now)?: No  Utilities: At Risk (09/15/2024)   Received from [facility]   Epic    In the past 12 months has the electric, gas, oil, or water  company threatened to shut off services in your home?: Yes  Health Literacy: Inadequate Health Literacy (08/26/2024)   Received from [facility]   B1300 Health Literacy    How often do you need to have someone help you when you read instructions, pamphlets, or other written material from your doctor or pharmacy?: Always    Review of Systems Per HPI  Objective:  BP 134/69 (BP Location: Left Arm,  Patient Position: Sitting)   Pulse 80   Temp 98.6 F (37 C)   Ht 6' (1.829 m)   Wt 194 lb 2 oz (88.1 kg)   SpO2 94%   BMI 26.33 kg/m      09/30/2024    2:46 PM 03/24/2024    3:21 PM 12/04/2023    4:15 PM  BP/Weight  Systolic BP 134 144 165  Diastolic BP 69 69 73  Wt. (Lbs) 194.13 195   BMI 26.33 kg/m2 26.45 kg/m2     Physical Exam Vitals and nursing note reviewed.  Constitutional:      General: He is not in acute distress.    Appearance: Normal appearance.  HENT:  Head: Normocephalic and atraumatic.  Eyes:     General:        Right eye: No discharge.        Left eye: No discharge.     Conjunctiva/sclera: Conjunctivae normal.  Cardiovascular:     Rate and Rhythm: Normal rate and regular rhythm.  Pulmonary:     Effort: Pulmonary effort is normal.     Breath sounds: Normal breath sounds. No wheezing, rhonchi or rales.  Neurological:     Mental Status: He is alert.  Psychiatric:        Mood and Affect: Mood normal.        Behavior: Behavior normal.     Lab Results  Component Value Date   WBC 8.1 08/28/2023   HGB 11.7 (L) 08/28/2023   HCT 34.7 (L) 08/28/2023   PLT 208 08/28/2023   GLUCOSE 247 (H) 08/28/2023   CHOL 95 05/31/2023   TRIG 315 (H) 05/31/2023   HDL 21 (L) 05/31/2023   LDLCALC 11 05/31/2023   ALT 13 08/28/2023   AST 19 08/28/2023   NA 137 08/28/2023   K 3.8 10/26/2023   CL 106 08/28/2023   CREATININE 6.72 (H) 08/28/2023   BUN 45 (H) 08/28/2023   CO2 19 (L) 08/28/2023   INR 1.0 05/30/2023   HGBA1C 7.5 (H) 09/05/2023     Assessment & Plan:  Essential hypertension Assessment & Plan: Stable.  Medications refilled.  Orders: -     amLODIPine  Besylate; Take 1 tablet (10 mg total) by mouth daily in the afternoon.  Dispense: 90 tablet; Refill: 3 -     Carvedilol ; Take 1 tablet (6.25 mg total) by mouth 2 (two) times daily.  Dispense: 180 tablet; Refill: 3  Type 2 diabetes mellitus with peripheral neuropathy [facility]) Assessment & Plan: Fair  control given age and comorbidity.  Meds refilled.  Orders: -     Dulaglutide ; Inject 4.5 mg into the skin every Sunday.  Dispense: 6 mL; Refill: 4 -     Lantus SoloStar; Inject 8 Units into the skin at bedtime.  Dispense: 15 mL; Refill: 0  Hyperlipidemia, mixed Assessment & Plan: At goal on Lipitor .  Continue.  Orders: -     Atorvastatin  Calcium ; Take 1 tablet (80 mg total) by mouth daily in the afternoon.  Dispense: 90 tablet; Refill: 3  History of stroke Assessment & Plan: Plavix  was recently discontinued.  Daily aspirin .  Orders: -     Aspirin ; Take 1 tablet (81 mg total) by mouth daily. Swallow whole.  Dispense: 90 tablet; Refill: 3  Benign prostatic hyperplasia, unspecified whether lower urinary tract symptoms present Assessment & Plan: Stable.  Flomax refilled.  Orders: -     Tamsulosin HCl; Take 2 capsules (0.8 mg total) by mouth daily.  Dispense: 180 capsule; Refill: 1    Follow-up:  3 months  Deago Burruss Bluford DO [facility] "

## 2024-10-02 NOTE — Assessment & Plan Note (Signed)
 Fair control given age and comorbidity.  Meds refilled.

## 2024-10-02 NOTE — Assessment & Plan Note (Signed)
 Plavix  was recently discontinued.  Daily aspirin .

## 2024-10-02 NOTE — Assessment & Plan Note (Signed)
Stable.  Flomax refilled. 

## 2024-10-02 NOTE — Assessment & Plan Note (Signed)
Stable.  Medications refilled. 

## 2024-10-22 NOTE — Progress Notes (Signed)
 "       Referring Practitoner: Self No address on file Reason for Visit: No chief complaint on file.   Plez Belton is a 74 y.o. African American male with a history of end stage renal disease who presents today for near term follow up regarding recent left AVG revision with Bovine Carotid Artegraft on 12/15. This is a left upper extremity upper arm AVG. Patient states that his post op course has been non-eventful. Denies fevers, chills, nausea, vomiting. Denies warmth, erythema, edema or drainage from the surgical site. Denies any numbness, weakness, tingling or pain to the hand.   He receives hemodialysis via IJ PermCath at [facility] on T-Th-Sat.   History    Past Medical History:  Diagnosis Date   Diabetes mellitus type 2, uncomplicated (CMS/HHS-HCC)    Hypertension    Neuropathy    Stroke (CMS/HHS-HCC)     Past Surgical History:  Procedure Laterality Date   REVISION ARTERIOVENOUS FISTULA Left 09/15/2024   Procedure: REVISION, OPEN, ARTERIOVENOUS FISTULA; WITHOUT THROMBECTOMY, AUTOGENOUS OR NONAUTOGENOUS DIALYSIS GRAFT (SEPARATE PROCEDURE);  Surgeon: Tonna Almarie Slice, MD;  Location: [facility] OR;  Service: General Surgery;  Laterality: Left;    Social History   Socioeconomic History   Marital status: Married    Spouse name: Arlyne   Number of children: 5  Occupational History   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  Tobacco Use   Smoking status: Former    Current packs/day: 0.50    Types: Cigarettes   Smokeless tobacco: Never   Tobacco comments:    Quit smoking since 2011   Substance and Sexual Activity   Alcohol use: Not Currently   Drug use: Never   Sexual activity: Defer   Social Drivers of Health   Financial Resource Strain: Medium Risk (09/15/2024)   Overall Financial Resource Strain (CARDIA)    Difficulty of Paying Living Expenses: Somewhat hard  Food Insecurity: Food Insecurity  Present (09/15/2024)   Hunger Vital Sign    Worried About Running Out of Food in the Last Year: Sometimes true    Ran Out of Food in the Last Year: Sometimes true  Transportation Needs: No Transportation Needs (09/16/2024)   PRAPARE - Administrator, Civil Service (Medical): No    Lack of Transportation (Non-Medical): No  Recent Concern: Transportation Needs - Unmet Transportation Needs (08/13/2024)   PRAPARE - Administrator, Civil Service (Medical): Yes    Lack of Transportation (Non-Medical): No  Physical Activity: Sufficiently Active (08/26/2024)   Exercise Vital Sign    Days of Exercise per Week: 3 days    Minutes of Exercise per Session: 60 min  Stress: No Stress Concern Present (08/26/2024)   [employer] of Occupational Health - Occupational Stress Questionnaire    Feeling of Stress : Only a little  Social Connections: Moderately Integrated (08/26/2024)   Social Connection and Isolation Panel    Frequency of Communication with Friends and Family: More than three times a week    Frequency of Social Gatherings with Friends and Family: Patient unable to answer    Attends Religious Services: More than 4 times per year    Active Member of [employer] or Organizations: No    Attends Banker Meetings: Never    Marital Status: Married  Housing Stability: High Risk (09/15/2024)   Housing Stability Vital Sign    Unable to Pay for Housing in the Last Year: Yes    Number  of Times Moved in the Last Year: 0    Homeless in the Last Year: No    No family history on file.  ROS 14 systems reviewed, pertinent positive and negatives as noted in the HPI and patient intake form. Intake form was reviewed, signed and dated.   Objective Data   There were no vitals taken for this visit.  Physical Exam General:  well developed, well nourished, no acute distress  Respiratory:  no increased work of breathing  Heart: regular rate and rhythm,  no murmurs  Abdomen:  soft, non tender, no masses  Upper Extremities: warm, good capillary refill, no lesions or edema  Lower Extremities: warm, good capillary refill, no lesions or edema  Neurologic: no motor or sensory deficits apparent  Psych:  alert and oriented x 3, normal affect   Vascular:  Access: left upper extremity upper arm AVG, palpable thrill, audible bruit. Incision site well healed without evidence of dehiscence.   Pulses: radial - palpable B/L                 Imaging  None   Assessment & Plan   Ammaar is a 74 y.o. male with above stated history. He is doing well since his AVG revision. He denies signs/symptoms which would indicate infection, steal syndrome or hematoma. He has a palpable thrill on exam and audible bruit. He is now 5 weeks post op and his exam is unremarkable. Dialysis can try to use his access site for cannulation. If they are successful, a referral has been placed to IR for removal of PermCath.   Reviewed signs and symptoms that warrant follow up evaluation. Questions were welcomed and answered. Patient voiced understanding and agrees with this treatment plan.  - Cannulate AVG access  - Remove PermCath after successful cannulation. IR Referral Placed  There are no diagnoses linked to this encounter.  No follow-ups on file.      Attestation Statement:   I personally performed the service, non-incident to. (WP)   JOSHUA DAVID PRUETT, PA  "

## 2024-11-05 ENCOUNTER — Ambulatory Visit: Admitting: Student

## 2024-11-05 ENCOUNTER — Encounter: Payer: Self-pay | Admitting: Student

## 2024-11-05 VITALS — BP 130/52 | HR 68 | Ht 72.0 in | Wt 195.0 lb

## 2024-11-05 DIAGNOSIS — I10 Essential (primary) hypertension: Secondary | ICD-10-CM | POA: Diagnosis not present

## 2024-11-05 DIAGNOSIS — I63431 Cerebral infarction due to embolism of right posterior cerebral artery: Secondary | ICD-10-CM | POA: Diagnosis not present

## 2024-11-05 DIAGNOSIS — N186 End stage renal disease: Secondary | ICD-10-CM | POA: Diagnosis not present

## 2024-11-05 DIAGNOSIS — E785 Hyperlipidemia, unspecified: Secondary | ICD-10-CM

## 2024-11-05 NOTE — Patient Instructions (Signed)
 Medication Instructions:  Your physician recommends that you continue on your current medications as directed. Please refer to the Current Medication list given to you today.  *If you need a refill on your cardiac medications before your next appointment, please call your pharmacy*  Lab Work: NONE   If you have labs (blood work) drawn today and your tests are completely normal, you will receive your results only by: MyChart Message (if you have MyChart) OR A paper copy in the mail If you have any lab test that is abnormal or we need to change your treatment, we will call you to review the results.  Testing/Procedures: NONE   Follow-Up: At [facility], you and your health needs are our priority.  As part of our continuing mission to provide you with exceptional heart care, our providers are all part of one team.  This team includes your primary Cardiologist (physician) and Advanced Practice Providers or APPs (Physician Assistants and Nurse Practitioners) who all work together to provide you with the care you need, when you need it.  Your next appointment:   6 month(s)  Provider:   Vishnu Mallipeddi, MD    We recommend signing up for the patient portal called MyChart.  Sign up information is provided on this After Visit Summary.  MyChart is used to connect with patients for Virtual Visits (Telemedicine).  Patients are able to view lab/test results, encounter notes, upcoming appointments, etc.  Non-urgent messages can be sent to your provider as well.   To learn more about what you can do with MyChart, go to [email or web address].   Other Instructions Thank you for choosing [facility]!

## 2024-11-05 NOTE — Progress Notes (Unsigned)
 "  Cardiology Office Note    Date:  11/06/2024  ID:  [name], DOB [birth year], MRN [id number] Cardiologist: Diannah SHAUNNA Maywood, MD { : History of Present Illness:    [name] is a 74 y.o. male with past medical history of HTN, HLD, Type II DM, ESRD and prior CVA (CVA of PCA territory in 05/2023 and felt to be embolic) who presents to the office today for annual follow-up and hospital follow-up.  He was last examined by Dr. Mallipeddi in 08/2023 and reported that his vision had improved since his recent stroke and denied any specific anginal symptoms. He was wearing a 30-day monitor and a TEE was recommended to rule out antibiotic etiology of his CVA. This was scheduled in 10/2023 but not performed given issues with IV access.  In the interim, he was admitted to [facility] from 10/31- 08/13/2024 for an acute right MCA ischemic stroke and he did receive TNK. TTE was performed during admission and showed a normal EF with negative bubble study and no regional motion normalities.  Carotid Doppler showed less than 50% stenosis bilaterally. It was felt that this was likely due to artery embolization of central plaque and a likely component of small vessel disease given he has ESRD, Type II DM and uncontrolled hypertension. An outpatient monitor was also placed but showed no evidence of atrial fibrillation or flutter. He did have AV graft dysfunction and required fistulogram during admission with angioplasty and improved flow. Was admitted to inpatient rehabilitation from 11/12 to 08/21/2024.  He had a recurrent admission from 12/15 to 12/17 for fistula revision with no immediate complications noted.  In talking with the patient and his wife today, they report that he is having difficulty walking and feels like this is due to known neuropathy. Was previously on Gabapentin but says this was not filled recently by his PCP. His blood sugar has been elevated when checked at home and is peaking into the 300's at  times. His wife closely monitors the food that he eats and tries to limit sodium intake as well. He denies any recent chest pain or palpitations. No specific orthopnea, PND or pitting edema. Currently living in [place] but they plan to move back to the [place] area and wish to keep the majority of visits within a close proximity.   Studies Reviewed:   EKG: EKG is ordered today and demonstrates:   EKG Interpretation Date/Time:  Wednesday November 05 2024 15:57:33 EST Ventricular Rate:  68 PR Interval:  130 QRS Duration:  88 QT Interval:  402 QTC Calculation: 427 R Axis:   65  Text Interpretation: Normal sinus rhythm Normal ECG When compared with ECG of 30-May-2023 22:30, PREVIOUS ECG IS PRESENT Confirmed by Johnson Grate ([id number]) on 11/05/2024 5:10:00 PM       Echocardiogram: 05/2023 IMPRESSIONS     1. Left ventricular ejection fraction, by estimation, is 55 to 60%. The  left ventricle has normal function. The left ventricle has no regional  wall motion abnormalities. Left ventricular diastolic parameters are  indeterminate.   2. Right ventricular systolic function is normal. The right ventricular  size is normal. There is normal pulmonary artery systolic pressure.   3. The mitral valve is normal in structure. Trivial mitral valve  regurgitation. No evidence of mitral stenosis.   4. The aortic valve is tricuspid. There is mild calcification of the  aortic valve. There is mild thickening of the aortic valve. Aortic valve  regurgitation is not visualized. No aortic stenosis  is present.   5. The inferior vena cava is normal in size with greater than 50%  respiratory variability, suggesting right atrial pressure of 3 mmHg.   6. Agitated saline contrast bubble study was negative, with no evidence  of any interatrial shunt.   Echocardiogram: 08/2024 NORMAL LEFT VENTRICULAR SYSTOLIC FUNCTION WITH NO LVH  ESTIMATED EF: 55%, CALC EF(3D): 58%  DIASTOLIC FUNCTION NOT ASSESSED   NORMAL RIGHT VENTRICULAR SYSTOLIC FUNCTION  VALVULAR REGURGITATION: No AR, MILD MR, TRIVIAL PR, No TR                   NO VALVULAR STENOSIS                                                         SALINE MICROCAVITATION STUDY IS NORMAL WITH VALSALVA    Event Monitor: 09/2024 30 day event monitor.  Predominant rhythm normal sinus, average heart rate of 88 bpm.  No atrial fibrillation or atrial flutter detected.  <1% ventricular ectopy burden.  3% supraventricular ectopy burden.  No pauses >3 seconds noted.  No manually  detected events.    Physical Exam:   VS:  BP (!) 130/52 (BP Location: Right Arm, Cuff Size: Normal)   Pulse 68   Ht 6' (1.829 m)   Wt 195 lb (88.5 kg)   SpO2 98%   BMI 26.45 kg/m    Wt Readings from Last 3 Encounters:  11/05/24 195 lb (88.5 kg)  09/30/24 194 lb 2 oz (88.1 kg)  03/24/24 195 lb (88.5 kg)     GEN: Pleasant male appearing in no acute distress NECK: No JVD; No carotid bruits CARDIAC: RRR, no murmurs, rubs, gallops RESPIRATORY:  Clear to auscultation without rales, wheezing or rhonchi  ABDOMEN: Appears non-distended. No obvious abdominal masses. EXTREMITIES: No clubbing or cyanosis. No pitting edema.  Distal pedal pulses are 2+ bilaterally.   Assessment and Plan:   1. Essential hypertension - Blood pressure is overall well-controlled at 130/52 during today's visit. Continue current medical therapy with Amlodipine  10 mg daily and Coreg  6.25 mg twice daily  2. Hyperlipidemia LDL goal <70 - FLP in 08/2024 showed total cholesterol 89, HDL 25 and LDL 26. Continue current medical therapy with Atorvastatin  80 mg daily.  3. 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 He is overall progressing well but is having worsening memory issues which I suspect is possibly due to to his prior  CVA's and vascular dementia. He has not been evaluated by Neurology since his hospitalization and his wife requests a referral to [facility] for this. - His prior monitors have shown no evidence of atrial fibrillation or atrial flutter. We briefly reviewed that if they recommend a loop recorder, our office can assist with arranging this.  4. ESRD (end stage renal disease) (HCC) - He is on dialysis. Currently being followed by Nephrology and Vascular Surgery at [facility].   Signed, [name] CHRISTELLA Qua, PA-C   "

## 2024-11-06 ENCOUNTER — Encounter: Payer: Self-pay | Admitting: Student

## 2024-12-26 ENCOUNTER — Ambulatory Visit
